# Patient Record
Sex: Female | Born: 1935
Health system: Southern US, Community
[De-identification: ages and names within clinical notes are randomized; demographics above are authoritative.]

## PROBLEM LIST (undated history)

## (undated) DIAGNOSIS — I639 Cerebral infarction, unspecified: Secondary | ICD-10-CM

## (undated) DIAGNOSIS — Z7189 Other specified counseling: Secondary | ICD-10-CM

## (undated) DIAGNOSIS — I4891 Unspecified atrial fibrillation: Secondary | ICD-10-CM

## (undated) DIAGNOSIS — I5032 Chronic diastolic (congestive) heart failure: Secondary | ICD-10-CM

## (undated) DIAGNOSIS — C3490 Malignant neoplasm of unspecified part of unspecified bronchus or lung: Secondary | ICD-10-CM

## (undated) DIAGNOSIS — I255 Ischemic cardiomyopathy: Secondary | ICD-10-CM

## (undated) DIAGNOSIS — K219 Gastro-esophageal reflux disease without esophagitis: Secondary | ICD-10-CM

## (undated) DIAGNOSIS — I2510 Atherosclerotic heart disease of native coronary artery without angina pectoris: Secondary | ICD-10-CM

## (undated) DIAGNOSIS — I10 Essential (primary) hypertension: Secondary | ICD-10-CM

## (undated) DIAGNOSIS — R5382 Chronic fatigue, unspecified: Secondary | ICD-10-CM

## (undated) DIAGNOSIS — D649 Anemia, unspecified: Secondary | ICD-10-CM

## (undated) DIAGNOSIS — I82409 Acute embolism and thrombosis of unspecified deep veins of unspecified lower extremity: Secondary | ICD-10-CM

## (undated) DIAGNOSIS — N189 Chronic kidney disease, unspecified: Secondary | ICD-10-CM

## (undated) DIAGNOSIS — E7800 Pure hypercholesterolemia, unspecified: Secondary | ICD-10-CM

## (undated) DIAGNOSIS — M21371 Foot drop, right foot: Secondary | ICD-10-CM

## (undated) HISTORY — DX: Essential (primary) hypertension: I10

## (undated) HISTORY — PX: CYSTOSCOPY: SHX5120

## (undated) HISTORY — DX: Anemia, unspecified: D64.9

## (undated) HISTORY — DX: Malignant neoplasm of unspecified part of unspecified bronchus or lung: C34.90

## (undated) HISTORY — DX: Pure hypercholesterolemia, unspecified: E78.00

## (undated) HISTORY — DX: Cerebral infarction, unspecified: I63.9

## (undated) HISTORY — PX: ABDOMINAL HYSTERECTOMY: SHX81

## (undated) HISTORY — DX: Chronic kidney disease, unspecified: N18.9

## (undated) HISTORY — PX: TONSILLECTOMY: SUR1361

## (undated) HISTORY — DX: Other specified counseling: Z71.89

## (undated) HISTORY — DX: Atherosclerotic heart disease of native coronary artery without angina pectoris: I25.10

## (undated) HISTORY — DX: Chronic fatigue, unspecified: R53.82

## (undated) HISTORY — PX: PORTACATH PLACEMENT: SHX2246

## (undated) HISTORY — PX: APPENDECTOMY: SHX54

## (undated) HISTORY — DX: Ischemic cardiomyopathy: I25.5

## (undated) HISTORY — PX: HEMORROIDECTOMY: SUR656

## (undated) HISTORY — PX: LAMINECTOMY: SHX219

## (undated) HISTORY — DX: Gastro-esophageal reflux disease without esophagitis: K21.9

## (undated) HISTORY — PX: CATARACT EXTRACTION: SUR2

---

## 2005-05-03 HISTORY — PX: HEMATOMA EVACUATION: SHX5118

## 2005-05-29 ENCOUNTER — Ambulatory Visit: Payer: Self-pay | Admitting: Internal Medicine

## 2005-05-29 ENCOUNTER — Inpatient Hospital Stay (HOSPITAL_COMMUNITY): Admission: EM | Admit: 2005-05-29 | Discharge: 2005-06-06 | Payer: Self-pay | Admitting: Cardiology

## 2005-05-29 ENCOUNTER — Encounter: Payer: Self-pay | Admitting: Cardiology

## 2005-06-11 ENCOUNTER — Ambulatory Visit: Payer: Self-pay | Admitting: Cardiology

## 2005-06-11 ENCOUNTER — Inpatient Hospital Stay (HOSPITAL_COMMUNITY): Admission: AD | Admit: 2005-06-11 | Discharge: 2005-06-17 | Payer: Self-pay | Admitting: Vascular Surgery

## 2005-06-27 ENCOUNTER — Ambulatory Visit: Payer: Self-pay | Admitting: Cardiology

## 2005-06-28 ENCOUNTER — Ambulatory Visit: Payer: Self-pay | Admitting: Cardiology

## 2005-07-01 ENCOUNTER — Ambulatory Visit: Payer: Self-pay | Admitting: Cardiology

## 2005-07-05 ENCOUNTER — Ambulatory Visit: Payer: Self-pay | Admitting: Cardiology

## 2005-07-09 ENCOUNTER — Ambulatory Visit: Payer: Self-pay | Admitting: Cardiology

## 2005-07-23 ENCOUNTER — Ambulatory Visit: Payer: Self-pay | Admitting: Cardiology

## 2005-08-13 ENCOUNTER — Ambulatory Visit: Payer: Self-pay | Admitting: *Deleted

## 2005-08-15 ENCOUNTER — Encounter: Payer: Self-pay | Admitting: Cardiology

## 2005-08-15 ENCOUNTER — Ambulatory Visit: Payer: Self-pay

## 2005-08-15 ENCOUNTER — Ambulatory Visit: Payer: Self-pay | Admitting: Cardiology

## 2005-08-16 ENCOUNTER — Ambulatory Visit: Payer: Self-pay | Admitting: Cardiology

## 2005-08-16 ENCOUNTER — Inpatient Hospital Stay (HOSPITAL_COMMUNITY): Admission: AD | Admit: 2005-08-16 | Discharge: 2005-08-21 | Payer: Self-pay | Admitting: Cardiology

## 2005-08-19 ENCOUNTER — Ambulatory Visit: Payer: Self-pay | Admitting: Emergency Medicine

## 2005-08-26 ENCOUNTER — Ambulatory Visit (HOSPITAL_COMMUNITY): Admission: RE | Admit: 2005-08-26 | Discharge: 2005-08-26 | Payer: Self-pay | Admitting: Emergency Medicine

## 2005-08-26 ENCOUNTER — Encounter (INDEPENDENT_AMBULATORY_CARE_PROVIDER_SITE_OTHER): Payer: Self-pay | Admitting: Specialist

## 2005-08-29 ENCOUNTER — Ambulatory Visit: Payer: Self-pay | Admitting: Emergency Medicine

## 2005-08-30 ENCOUNTER — Ambulatory Visit: Payer: Self-pay | Admitting: Internal Medicine

## 2005-09-04 LAB — COMPREHENSIVE METABOLIC PANEL
AST: 23 U/L (ref 0–37)
Alkaline Phosphatase: 82 U/L (ref 39–117)
BUN: 47 mg/dL — ABNORMAL HIGH (ref 6–23)
Calcium: 9.2 mg/dL (ref 8.4–10.5)
Creatinine, Ser: 3.5 mg/dL — ABNORMAL HIGH (ref 0.4–1.2)
Glucose, Bld: 107 mg/dL — ABNORMAL HIGH (ref 70–99)
Total Bilirubin: 0.7 mg/dL (ref 0.3–1.2)
Total Protein: 7.5 g/dL (ref 6.0–8.3)

## 2005-09-04 LAB — CBC WITH DIFFERENTIAL/PLATELET
Basophils Absolute: 0.1 10*3/uL (ref 0.0–0.1)
EOS%: 8.3 % — ABNORMAL HIGH (ref 0.0–7.0)
HCT: 36.8 % (ref 34.8–46.6)
HGB: 12 g/dL (ref 11.6–15.9)
LYMPH%: 13.3 % — ABNORMAL LOW (ref 14.0–48.0)
MCH: 26.2 pg (ref 26.0–34.0)
MCHC: 32.7 g/dL (ref 32.0–36.0)
NEUT%: 68.4 % (ref 39.6–76.8)
Platelets: 269 10*3/uL (ref 145–400)
lymph#: 0.8 10*3/uL — ABNORMAL LOW (ref 0.9–3.3)

## 2005-09-05 ENCOUNTER — Ambulatory Visit (HOSPITAL_COMMUNITY): Admission: RE | Admit: 2005-09-05 | Discharge: 2005-09-05 | Payer: Self-pay | Admitting: Internal Medicine

## 2005-09-12 LAB — COMPREHENSIVE METABOLIC PANEL
ALT: 26 U/L (ref 0–40)
AST: 33 U/L (ref 0–37)
Albumin: 4.2 g/dL (ref 3.5–5.2)
Calcium: 9.1 mg/dL (ref 8.4–10.5)
Chloride: 101 mEq/L (ref 96–112)
Potassium: 4.2 mEq/L (ref 3.5–5.3)
Sodium: 139 mEq/L (ref 135–145)
Total Protein: 7.3 g/dL (ref 6.0–8.3)

## 2005-09-12 LAB — CBC WITH DIFFERENTIAL/PLATELET
BASO%: 0.7 % (ref 0.0–2.0)
EOS%: 11.6 % — ABNORMAL HIGH (ref 0.0–7.0)
HGB: 11.6 g/dL (ref 11.6–15.9)
MCH: 25.8 pg — ABNORMAL LOW (ref 26.0–34.0)
MCHC: 32.8 g/dL (ref 32.0–36.0)
RDW: 15.1 % — ABNORMAL HIGH (ref 11.3–14.5)
lymph#: 0.9 10*3/uL (ref 0.9–3.3)

## 2005-09-18 ENCOUNTER — Encounter: Admission: RE | Admit: 2005-09-18 | Discharge: 2005-09-18 | Payer: Self-pay | Admitting: Internal Medicine

## 2005-09-19 ENCOUNTER — Ambulatory Visit: Payer: Self-pay | Admitting: Cardiology

## 2005-09-23 LAB — CBC WITH DIFFERENTIAL/PLATELET
Eosinophils Absolute: 0 10*3/uL (ref 0.0–0.5)
LYMPH%: 4.6 % — ABNORMAL LOW (ref 14.0–48.0)
MCV: 77.8 fL — ABNORMAL LOW (ref 81.0–101.0)
MONO%: 1.9 % (ref 0.0–13.0)
NEUT#: 9.3 10*3/uL — ABNORMAL HIGH (ref 1.5–6.5)
NEUT%: 93.3 % — ABNORMAL HIGH (ref 39.6–76.8)
Platelets: 198 10*3/uL (ref 145–400)
RBC: 4.44 10*6/uL (ref 3.70–5.32)

## 2005-09-23 LAB — COMPREHENSIVE METABOLIC PANEL
ALT: 81 U/L — ABNORMAL HIGH (ref 0–40)
Alkaline Phosphatase: 131 U/L — ABNORMAL HIGH (ref 39–117)
Glucose, Bld: 227 mg/dL — ABNORMAL HIGH (ref 70–99)
Sodium: 134 mEq/L — ABNORMAL LOW (ref 135–145)
Total Bilirubin: 0.5 mg/dL (ref 0.3–1.2)
Total Protein: 7.6 g/dL (ref 6.0–8.3)

## 2005-09-27 LAB — CBC WITH DIFFERENTIAL/PLATELET
BASO%: 0.3 % (ref 0.0–2.0)
EOS%: 2.4 % (ref 0.0–7.0)
LYMPH%: 3.8 % — ABNORMAL LOW (ref 14.0–48.0)
MCH: 25.7 pg — ABNORMAL LOW (ref 26.0–34.0)
MCHC: 33.1 g/dL (ref 32.0–36.0)
MCV: 77.6 fL — ABNORMAL LOW (ref 81.0–101.0)
MONO#: 0.1 10*3/uL (ref 0.1–0.9)
MONO%: 0.9 % (ref 0.0–13.0)
Platelets: 110 10*3/uL — ABNORMAL LOW (ref 145–400)
RBC: 4.4 10*6/uL (ref 3.70–5.32)
WBC: 11 10*3/uL — ABNORMAL HIGH (ref 3.9–10.0)

## 2005-09-27 LAB — BASIC METABOLIC PANEL
Calcium: 7.7 mg/dL — ABNORMAL LOW (ref 8.4–10.5)
Creatinine, Ser: 3 mg/dL — ABNORMAL HIGH (ref 0.4–1.2)
Sodium: 135 mEq/L (ref 135–145)

## 2005-10-02 LAB — CBC WITH DIFFERENTIAL/PLATELET
BASO%: 1.1 % (ref 0.0–2.0)
EOS%: 0.5 % (ref 0.0–7.0)
MCH: 25.7 pg — ABNORMAL LOW (ref 26.0–34.0)
MCHC: 33 g/dL (ref 32.0–36.0)
RDW: 14.8 % — ABNORMAL HIGH (ref 11.3–14.5)
lymph#: 0.7 10*3/uL — ABNORMAL LOW (ref 0.9–3.3)

## 2005-10-02 LAB — COMPREHENSIVE METABOLIC PANEL
ALT: 33 U/L (ref 0–40)
AST: 22 U/L (ref 0–37)
Albumin: 3.8 g/dL (ref 3.5–5.2)
Calcium: 8.6 mg/dL (ref 8.4–10.5)
Chloride: 105 mEq/L (ref 96–112)
Creatinine, Ser: 2.5 mg/dL — ABNORMAL HIGH (ref 0.4–1.2)
Potassium: 4.3 mEq/L (ref 3.5–5.3)

## 2005-10-09 LAB — CBC WITH DIFFERENTIAL/PLATELET
BASO%: 0.4 % (ref 0.0–2.0)
EOS%: 2.2 % (ref 0.0–7.0)
HCT: 34.8 % (ref 34.8–46.6)
MCH: 25.7 pg — ABNORMAL LOW (ref 26.0–34.0)
MCHC: 32.7 g/dL (ref 32.0–36.0)
MCV: 78.4 fL — ABNORMAL LOW (ref 81.0–101.0)
MONO%: 7.1 % (ref 0.0–13.0)
NEUT%: 79 % — ABNORMAL HIGH (ref 39.6–76.8)
lymph#: 0.8 10*3/uL — ABNORMAL LOW (ref 0.9–3.3)

## 2005-10-09 LAB — COMPREHENSIVE METABOLIC PANEL
ALT: 21 U/L (ref 0–40)
AST: 25 U/L (ref 0–37)
Alkaline Phosphatase: 113 U/L (ref 39–117)
Calcium: 8.7 mg/dL (ref 8.4–10.5)
Chloride: 108 mEq/L (ref 96–112)
Creatinine, Ser: 2.3 mg/dL — ABNORMAL HIGH (ref 0.4–1.2)
Total Bilirubin: 0.4 mg/dL (ref 0.3–1.2)

## 2005-10-14 ENCOUNTER — Ambulatory Visit (HOSPITAL_COMMUNITY): Admission: RE | Admit: 2005-10-14 | Discharge: 2005-10-14 | Payer: Self-pay | Admitting: Internal Medicine

## 2005-10-14 LAB — COMPREHENSIVE METABOLIC PANEL
BUN: 40 mg/dL — ABNORMAL HIGH (ref 6–23)
CO2: 23 mEq/L (ref 19–32)
Calcium: 9.1 mg/dL (ref 8.4–10.5)
Chloride: 103 mEq/L (ref 96–112)
Creatinine, Ser: 2.5 mg/dL — ABNORMAL HIGH (ref 0.4–1.2)
Glucose, Bld: 151 mg/dL — ABNORMAL HIGH (ref 70–99)
Total Bilirubin: 0.4 mg/dL (ref 0.3–1.2)

## 2005-10-14 LAB — CBC WITH DIFFERENTIAL/PLATELET
BASO%: 0.2 % (ref 0.0–2.0)
Basophils Absolute: 0 10*3/uL (ref 0.0–0.1)
EOS%: 0.1 % (ref 0.0–7.0)
HCT: 35.6 % (ref 34.8–46.6)
LYMPH%: 8.3 % — ABNORMAL LOW (ref 14.0–48.0)
MCH: 26 pg (ref 26.0–34.0)
MCHC: 33.2 g/dL (ref 32.0–36.0)
MCV: 78.2 fL — ABNORMAL LOW (ref 81.0–101.0)
NEUT%: 88.2 % — ABNORMAL HIGH (ref 39.6–76.8)
Platelets: 206 10*3/uL (ref 145–400)

## 2005-10-15 ENCOUNTER — Ambulatory Visit: Payer: Self-pay | Admitting: Internal Medicine

## 2005-10-21 LAB — COMPREHENSIVE METABOLIC PANEL
ALT: 27 U/L (ref 0–40)
AST: 25 U/L (ref 0–37)
Alkaline Phosphatase: 103 U/L (ref 39–117)
BUN: 34 mg/dL — ABNORMAL HIGH (ref 6–23)
Calcium: 8.7 mg/dL (ref 8.4–10.5)
Chloride: 104 mEq/L (ref 96–112)
Creatinine, Ser: 2.1 mg/dL — ABNORMAL HIGH (ref 0.4–1.2)
Potassium: 4.5 mEq/L (ref 3.5–5.3)

## 2005-10-21 LAB — CBC WITH DIFFERENTIAL/PLATELET
BASO%: 0.4 % (ref 0.0–2.0)
EOS%: 2.9 % (ref 0.0–7.0)
MCH: 25.9 pg — ABNORMAL LOW (ref 26.0–34.0)
MCHC: 33.1 g/dL (ref 32.0–36.0)
MCV: 78.4 fL — ABNORMAL LOW (ref 81.0–101.0)
MONO%: 18.2 % — ABNORMAL HIGH (ref 0.0–13.0)
NEUT%: 64.7 % (ref 39.6–76.8)
RDW: 16.5 % — ABNORMAL HIGH (ref 11.3–14.5)
lymph#: 0.6 10*3/uL — ABNORMAL LOW (ref 0.9–3.3)

## 2005-10-30 LAB — COMPREHENSIVE METABOLIC PANEL
ALT: 18 U/L (ref 0–40)
AST: 21 U/L (ref 0–37)
Alkaline Phosphatase: 132 U/L — ABNORMAL HIGH (ref 39–117)
BUN: 30 mg/dL — ABNORMAL HIGH (ref 6–23)
Calcium: 8.4 mg/dL (ref 8.4–10.5)
Creatinine, Ser: 2.5 mg/dL — ABNORMAL HIGH (ref 0.4–1.2)
Total Bilirubin: 0.4 mg/dL (ref 0.3–1.2)

## 2005-10-30 LAB — CBC WITH DIFFERENTIAL/PLATELET
BASO%: 0.4 % (ref 0.0–2.0)
Basophils Absolute: 0 10*3/uL (ref 0.0–0.1)
EOS%: 1.1 % (ref 0.0–7.0)
HCT: 34 % — ABNORMAL LOW (ref 34.8–46.6)
HGB: 11.3 g/dL — ABNORMAL LOW (ref 11.6–15.9)
LYMPH%: 10.6 % — ABNORMAL LOW (ref 14.0–48.0)
MCH: 26.3 pg (ref 26.0–34.0)
MCHC: 33.1 g/dL (ref 32.0–36.0)
MCV: 79.5 fL — ABNORMAL LOW (ref 81.0–101.0)
MONO%: 7.7 % (ref 0.0–13.0)
NEUT%: 80.2 % — ABNORMAL HIGH (ref 39.6–76.8)
lymph#: 0.9 10*3/uL (ref 0.9–3.3)

## 2005-11-04 LAB — COMPREHENSIVE METABOLIC PANEL
ALT: 20 U/L (ref 0–40)
AST: 20 U/L (ref 0–37)
Alkaline Phosphatase: 125 U/L — ABNORMAL HIGH (ref 39–117)
CO2: 22 mEq/L (ref 19–32)
Creatinine, Ser: 2.13 mg/dL — ABNORMAL HIGH (ref 0.40–1.20)
Sodium: 140 mEq/L (ref 135–145)
Total Bilirubin: 0.4 mg/dL (ref 0.3–1.2)
Total Protein: 6.9 g/dL (ref 6.0–8.3)

## 2005-11-04 LAB — CBC WITH DIFFERENTIAL/PLATELET
BASO%: 0 % (ref 0.0–2.0)
EOS%: 0 % (ref 0.0–7.0)
HCT: 36.3 % (ref 34.8–46.6)
LYMPH%: 6.7 % — ABNORMAL LOW (ref 14.0–48.0)
MCH: 26.3 pg (ref 26.0–34.0)
MCHC: 32.8 g/dL (ref 32.0–36.0)
MCV: 80.2 fL — ABNORMAL LOW (ref 81.0–101.0)
MONO%: 1.3 % (ref 0.0–13.0)
NEUT%: 92 % — ABNORMAL HIGH (ref 39.6–76.8)
Platelets: 185 10*3/uL (ref 145–400)
RBC: 4.52 10*6/uL (ref 3.70–5.32)

## 2005-11-08 ENCOUNTER — Ambulatory Visit: Payer: Self-pay | Admitting: Oncology

## 2005-11-08 ENCOUNTER — Inpatient Hospital Stay (HOSPITAL_COMMUNITY): Admission: AD | Admit: 2005-11-08 | Discharge: 2005-11-10 | Payer: Self-pay | Admitting: Internal Medicine

## 2005-11-08 LAB — CBC WITH DIFFERENTIAL/PLATELET
BASO%: 0.1 % (ref 0.0–2.0)
HCT: 34.4 % — ABNORMAL LOW (ref 34.8–46.6)
LYMPH%: 4.6 % — ABNORMAL LOW (ref 14.0–48.0)
MCHC: 32.8 g/dL (ref 32.0–36.0)
MCV: 80.5 fL — ABNORMAL LOW (ref 81.0–101.0)
MONO#: 0.1 10*3/uL (ref 0.1–0.9)
MONO%: 0.5 % (ref 0.0–13.0)
NEUT%: 92.8 % — ABNORMAL HIGH (ref 39.6–76.8)
Platelets: 143 10*3/uL — ABNORMAL LOW (ref 145–400)
WBC: 16 10*3/uL — ABNORMAL HIGH (ref 3.9–10.0)

## 2005-11-18 ENCOUNTER — Ambulatory Visit (HOSPITAL_COMMUNITY): Admission: RE | Admit: 2005-11-18 | Discharge: 2005-11-18 | Payer: Self-pay | Admitting: Internal Medicine

## 2005-11-19 ENCOUNTER — Ambulatory Visit: Payer: Self-pay | Admitting: Cardiology

## 2005-11-25 ENCOUNTER — Ambulatory Visit: Payer: Self-pay | Admitting: Internal Medicine

## 2005-11-25 LAB — CBC WITH DIFFERENTIAL/PLATELET
Basophils Absolute: 0 10*3/uL (ref 0.0–0.1)
Eosinophils Absolute: 0 10*3/uL (ref 0.0–0.5)
HCT: 34.5 % — ABNORMAL LOW (ref 34.8–46.6)
LYMPH%: 6.5 % — ABNORMAL LOW (ref 14.0–48.0)
MONO#: 0.2 10*3/uL (ref 0.1–0.9)
NEUT#: 6.2 10*3/uL (ref 1.5–6.5)
NEUT%: 90.4 % — ABNORMAL HIGH (ref 39.6–76.8)
Platelets: 110 10*3/uL — ABNORMAL LOW (ref 145–400)
WBC: 6.8 10*3/uL (ref 3.9–10.0)

## 2005-11-25 LAB — COMPREHENSIVE METABOLIC PANEL
ALT: 20 U/L (ref 0–40)
Alkaline Phosphatase: 122 U/L — ABNORMAL HIGH (ref 39–117)
CO2: 24 mEq/L (ref 19–32)
Creatinine, Ser: 2.18 mg/dL — ABNORMAL HIGH (ref 0.40–1.20)
Glucose, Bld: 139 mg/dL — ABNORMAL HIGH (ref 70–99)
Total Bilirubin: 0.5 mg/dL (ref 0.3–1.2)

## 2005-12-16 LAB — CBC WITH DIFFERENTIAL/PLATELET
BASO%: 0.3 % (ref 0.0–2.0)
EOS%: 0.6 % (ref 0.0–7.0)
Eosinophils Absolute: 0 10*3/uL (ref 0.0–0.5)
LYMPH%: 9.2 % — ABNORMAL LOW (ref 14.0–48.0)
MCHC: 32.8 g/dL (ref 32.0–36.0)
MCV: 89.8 fL (ref 81.0–101.0)
MONO#: 0.1 10*3/uL (ref 0.1–0.9)
NEUT#: 3.8 10*3/uL (ref 1.5–6.5)
NEUT%: 88.5 % — ABNORMAL HIGH (ref 39.6–76.8)
Platelets: 155 10*3/uL (ref 145–400)
RBC: 3.54 10*6/uL — ABNORMAL LOW (ref 3.70–5.32)
RDW: 20.4 % — ABNORMAL HIGH (ref 11.3–14.5)
WBC: 4.3 10*3/uL (ref 3.9–10.0)

## 2005-12-16 LAB — COMPREHENSIVE METABOLIC PANEL
ALT: 30 U/L (ref 0–40)
AST: 30 U/L (ref 0–37)
Albumin: 3.9 g/dL (ref 3.5–5.2)
Alkaline Phosphatase: 146 U/L — ABNORMAL HIGH (ref 39–117)
CO2: 23 mEq/L (ref 19–32)
Creatinine, Ser: 2.3 mg/dL — ABNORMAL HIGH (ref 0.40–1.20)
Glucose, Bld: 144 mg/dL — ABNORMAL HIGH (ref 70–99)
Potassium: 4.3 mEq/L (ref 3.5–5.3)
Sodium: 137 mEq/L (ref 135–145)
Total Bilirubin: 0.5 mg/dL (ref 0.3–1.2)
Total Protein: 6.5 g/dL (ref 6.0–8.3)

## 2005-12-23 LAB — COMPREHENSIVE METABOLIC PANEL
ALT: 19 U/L (ref 0–40)
AST: 28 U/L (ref 0–37)
Albumin: 3.5 g/dL (ref 3.5–5.2)
Alkaline Phosphatase: 113 U/L (ref 39–117)
Glucose, Bld: 104 mg/dL — ABNORMAL HIGH (ref 70–99)
Potassium: 4.4 mEq/L (ref 3.5–5.3)
Sodium: 137 mEq/L (ref 135–145)
Total Bilirubin: 0.7 mg/dL (ref 0.3–1.2)
Total Protein: 5.6 g/dL — ABNORMAL LOW (ref 6.0–8.3)

## 2005-12-23 LAB — CBC WITH DIFFERENTIAL/PLATELET
BASO%: 0.8 % (ref 0.0–2.0)
Eosinophils Absolute: 0 10*3/uL (ref 0.0–0.5)
MCHC: 34.4 g/dL (ref 32.0–36.0)
MCV: 89.5 fL (ref 81.0–101.0)
MONO%: 22.9 % — ABNORMAL HIGH (ref 0.0–13.0)
NEUT#: 1 10*3/uL — ABNORMAL LOW (ref 1.5–6.5)
RBC: 3.14 10*6/uL — ABNORMAL LOW (ref 3.70–5.32)
RDW: 22.9 % — ABNORMAL HIGH (ref 11.3–14.5)
WBC: 1.9 10*3/uL — ABNORMAL LOW (ref 3.9–10.0)

## 2005-12-24 ENCOUNTER — Ambulatory Visit: Payer: Self-pay | Admitting: Internal Medicine

## 2005-12-30 LAB — COMPREHENSIVE METABOLIC PANEL
ALT: 16 U/L (ref 0–40)
AST: 28 U/L (ref 0–37)
CO2: 25 mEq/L (ref 19–32)
Creatinine, Ser: 1.72 mg/dL — ABNORMAL HIGH (ref 0.40–1.20)
Sodium: 140 mEq/L (ref 135–145)
Total Bilirubin: 0.6 mg/dL (ref 0.3–1.2)
Total Protein: 5.3 g/dL — ABNORMAL LOW (ref 6.0–8.3)

## 2005-12-30 LAB — CBC WITH DIFFERENTIAL/PLATELET
EOS%: 0.8 % (ref 0.0–7.0)
LYMPH%: 10.8 % — ABNORMAL LOW (ref 14.0–48.0)
MCH: 30.9 pg (ref 26.0–34.0)
MCHC: 33.8 g/dL (ref 32.0–36.0)
MCV: 91.4 fL (ref 81.0–101.0)
MONO%: 7.1 % (ref 0.0–13.0)
NEUT#: 3.6 10*3/uL (ref 1.5–6.5)
Platelets: 30 10*3/uL — ABNORMAL LOW (ref 145–400)
RBC: 3.07 10*6/uL — ABNORMAL LOW (ref 3.70–5.32)
RDW: 22.2 % — ABNORMAL HIGH (ref 11.3–14.5)

## 2006-01-06 LAB — COMPREHENSIVE METABOLIC PANEL
BUN: 27 mg/dL — ABNORMAL HIGH (ref 6–23)
Chloride: 100 mEq/L (ref 96–112)
Creatinine, Ser: 1.92 mg/dL — ABNORMAL HIGH (ref 0.40–1.20)
Glucose, Bld: 127 mg/dL — ABNORMAL HIGH (ref 70–99)
Potassium: 3.8 mEq/L (ref 3.5–5.3)
Total Bilirubin: 0.5 mg/dL (ref 0.3–1.2)
Total Protein: 6.1 g/dL (ref 6.0–8.3)

## 2006-01-06 LAB — CBC WITH DIFFERENTIAL/PLATELET
BASO%: 0.3 % (ref 0.0–2.0)
EOS%: 0.1 % (ref 0.0–7.0)
MCH: 31.3 pg (ref 26.0–34.0)
MCHC: 32.3 g/dL (ref 32.0–36.0)
MCV: 96.9 fL (ref 81.0–101.0)
MONO%: 3.5 % (ref 0.0–13.0)
RBC: 3.38 10*6/uL — ABNORMAL LOW (ref 3.70–5.32)
RDW: 19.7 % — ABNORMAL HIGH (ref 11.3–14.5)
lymph#: 0.3 10*3/uL — ABNORMAL LOW (ref 0.9–3.3)

## 2006-01-13 LAB — COMPREHENSIVE METABOLIC PANEL
Albumin: 3.5 g/dL (ref 3.5–5.2)
CO2: 22 mEq/L (ref 19–32)
Calcium: 8.3 mg/dL — ABNORMAL LOW (ref 8.4–10.5)
Glucose, Bld: 137 mg/dL — ABNORMAL HIGH (ref 70–99)
Potassium: 3.9 mEq/L (ref 3.5–5.3)
Sodium: 139 mEq/L (ref 135–145)
Total Bilirubin: 0.7 mg/dL (ref 0.3–1.2)
Total Protein: 5.5 g/dL — ABNORMAL LOW (ref 6.0–8.3)

## 2006-01-13 LAB — CBC WITH DIFFERENTIAL/PLATELET
Eosinophils Absolute: 0.1 10*3/uL (ref 0.0–0.5)
LYMPH%: 28.2 % (ref 14.0–48.0)
MONO#: 0.4 10*3/uL (ref 0.1–0.9)
NEUT#: 0.7 10*3/uL — ABNORMAL LOW (ref 1.5–6.5)
Platelets: 73 10*3/uL — ABNORMAL LOW (ref 145–400)
RBC: 3.11 10*6/uL — ABNORMAL LOW (ref 3.70–5.32)
WBC: 1.8 10*3/uL — ABNORMAL LOW (ref 3.9–10.0)

## 2006-01-20 LAB — CBC WITH DIFFERENTIAL/PLATELET
Basophils Absolute: 0.1 10*3/uL (ref 0.0–0.1)
Eosinophils Absolute: 0.1 10*3/uL (ref 0.0–0.5)
HCT: 28.2 % — ABNORMAL LOW (ref 34.8–46.6)
HGB: 9.5 g/dL — ABNORMAL LOW (ref 11.6–15.9)
LYMPH%: 13.3 % — ABNORMAL LOW (ref 14.0–48.0)
MCV: 97.4 fL (ref 81.0–101.0)
MONO%: 5.6 % (ref 0.0–13.0)
NEUT#: 3.6 10*3/uL (ref 1.5–6.5)
NEUT%: 78.6 % — ABNORMAL HIGH (ref 39.6–76.8)
Platelets: 23 10*3/uL — ABNORMAL LOW (ref 145–400)
RBC: 2.89 10*6/uL — ABNORMAL LOW (ref 3.70–5.32)

## 2006-01-20 LAB — COMPREHENSIVE METABOLIC PANEL
Alkaline Phosphatase: 138 U/L — ABNORMAL HIGH (ref 39–117)
BUN: 18 mg/dL (ref 6–23)
CO2: 24 mEq/L (ref 19–32)
Creatinine, Ser: 1.84 mg/dL — ABNORMAL HIGH (ref 0.40–1.20)
Glucose, Bld: 103 mg/dL — ABNORMAL HIGH (ref 70–99)
Total Bilirubin: 0.7 mg/dL (ref 0.3–1.2)

## 2006-01-27 LAB — CBC WITH DIFFERENTIAL/PLATELET
Basophils Absolute: 0 10*3/uL (ref 0.0–0.1)
Eosinophils Absolute: 0.2 10*3/uL (ref 0.0–0.5)
HGB: 9.7 g/dL — ABNORMAL LOW (ref 11.6–15.9)
LYMPH%: 19.3 % (ref 14.0–48.0)
MCV: 100.3 fL (ref 81.0–101.0)
MONO#: 0.3 10*3/uL (ref 0.1–0.9)
MONO%: 10.9 % (ref 0.0–13.0)
NEUT#: 1.8 10*3/uL (ref 1.5–6.5)
Platelets: 77 10*3/uL — ABNORMAL LOW (ref 145–400)
RDW: 20.7 % — ABNORMAL HIGH (ref 11.3–14.5)
WBC: 3 10*3/uL — ABNORMAL LOW (ref 3.9–10.0)

## 2006-01-27 LAB — COMPREHENSIVE METABOLIC PANEL
Albumin: 3.3 g/dL — ABNORMAL LOW (ref 3.5–5.2)
Alkaline Phosphatase: 118 U/L — ABNORMAL HIGH (ref 39–117)
BUN: 22 mg/dL (ref 6–23)
CO2: 28 mEq/L (ref 19–32)
Glucose, Bld: 93 mg/dL (ref 70–99)
Potassium: 3.4 mEq/L — ABNORMAL LOW (ref 3.5–5.3)
Sodium: 141 mEq/L (ref 135–145)
Total Protein: 5.5 g/dL — ABNORMAL LOW (ref 6.0–8.3)

## 2006-01-28 ENCOUNTER — Ambulatory Visit (HOSPITAL_COMMUNITY): Admission: RE | Admit: 2006-01-28 | Discharge: 2006-01-28 | Payer: Self-pay | Admitting: Internal Medicine

## 2006-02-04 LAB — COMPREHENSIVE METABOLIC PANEL
Albumin: 3.5 g/dL (ref 3.5–5.2)
Alkaline Phosphatase: 145 U/L — ABNORMAL HIGH (ref 39–117)
BUN: 22 mg/dL (ref 6–23)
CO2: 28 mEq/L (ref 19–32)
Calcium: 8.8 mg/dL (ref 8.4–10.5)
Chloride: 100 mEq/L (ref 96–112)
Glucose, Bld: 117 mg/dL — ABNORMAL HIGH (ref 70–99)
Potassium: 3.6 mEq/L (ref 3.5–5.3)
Sodium: 140 mEq/L (ref 135–145)
Total Protein: 5.7 g/dL — ABNORMAL LOW (ref 6.0–8.3)

## 2006-02-04 LAB — CBC WITH DIFFERENTIAL/PLATELET
Basophils Absolute: 0 10*3/uL (ref 0.0–0.1)
Eosinophils Absolute: 0.2 10*3/uL (ref 0.0–0.5)
HGB: 11 g/dL — ABNORMAL LOW (ref 11.6–15.9)
MCV: 99.5 fL (ref 81.0–101.0)
MONO#: 0.6 10*3/uL (ref 0.1–0.9)
NEUT#: 2.2 10*3/uL (ref 1.5–6.5)
RBC: 3.31 10*6/uL — ABNORMAL LOW (ref 3.70–5.32)
RDW: 19 % — ABNORMAL HIGH (ref 11.3–14.5)
WBC: 3.6 10*3/uL — ABNORMAL LOW (ref 3.9–10.0)
lymph#: 0.5 10*3/uL — ABNORMAL LOW (ref 0.9–3.3)

## 2006-02-20 ENCOUNTER — Ambulatory Visit: Payer: Self-pay | Admitting: Cardiology

## 2006-03-28 ENCOUNTER — Ambulatory Visit: Payer: Self-pay | Admitting: Internal Medicine

## 2006-04-03 ENCOUNTER — Ambulatory Visit (HOSPITAL_COMMUNITY): Admission: RE | Admit: 2006-04-03 | Discharge: 2006-04-03 | Payer: Self-pay | Admitting: Internal Medicine

## 2006-04-03 LAB — COMPREHENSIVE METABOLIC PANEL
ALT: 17 U/L (ref 0–35)
AST: 24 U/L (ref 0–37)
Albumin: 4 g/dL (ref 3.5–5.2)
BUN: 31 mg/dL — ABNORMAL HIGH (ref 6–23)
Calcium: 8.8 mg/dL (ref 8.4–10.5)
Chloride: 106 mEq/L (ref 96–112)
Potassium: 4 mEq/L (ref 3.5–5.3)
Sodium: 142 mEq/L (ref 135–145)
Total Protein: 6.6 g/dL (ref 6.0–8.3)

## 2006-04-03 LAB — CBC WITH DIFFERENTIAL/PLATELET
Basophils Absolute: 0 10*3/uL (ref 0.0–0.1)
EOS%: 9.5 % — ABNORMAL HIGH (ref 0.0–7.0)
HCT: 36.7 % (ref 34.8–46.6)
HGB: 12.4 g/dL (ref 11.6–15.9)
MONO#: 0.4 10*3/uL (ref 0.1–0.9)
NEUT#: 2.9 10*3/uL (ref 1.5–6.5)
NEUT%: 63.9 % (ref 39.6–76.8)
RDW: 13.8 % (ref 11.3–14.5)
WBC: 4.5 10*3/uL (ref 3.9–10.0)
lymph#: 0.7 10*3/uL — ABNORMAL LOW (ref 0.9–3.3)

## 2006-04-14 ENCOUNTER — Ambulatory Visit: Payer: Self-pay | Admitting: Cardiology

## 2006-06-27 ENCOUNTER — Ambulatory Visit: Payer: Self-pay | Admitting: Internal Medicine

## 2006-07-01 ENCOUNTER — Ambulatory Visit (HOSPITAL_COMMUNITY): Admission: RE | Admit: 2006-07-01 | Discharge: 2006-07-01 | Payer: Self-pay | Admitting: Internal Medicine

## 2006-07-01 LAB — COMPREHENSIVE METABOLIC PANEL
ALT: 21 U/L (ref 0–35)
AST: 25 U/L (ref 0–37)
Calcium: 9 mg/dL (ref 8.4–10.5)
Chloride: 104 mEq/L (ref 96–112)
Creatinine, Ser: 1.88 mg/dL — ABNORMAL HIGH (ref 0.40–1.20)

## 2006-07-01 LAB — CBC WITH DIFFERENTIAL/PLATELET
BASO%: 1 % (ref 0.0–2.0)
EOS%: 16.2 % — ABNORMAL HIGH (ref 0.0–7.0)
HCT: 40.3 % (ref 34.8–46.6)
MCH: 28.8 pg (ref 26.0–34.0)
MCHC: 33.8 g/dL (ref 32.0–36.0)
NEUT%: 58.6 % (ref 39.6–76.8)
RDW: 13.2 % (ref 11.3–14.5)
lymph#: 0.8 10*3/uL — ABNORMAL LOW (ref 0.9–3.3)

## 2006-07-08 ENCOUNTER — Ambulatory Visit: Payer: Self-pay | Admitting: Internal Medicine

## 2006-07-08 ENCOUNTER — Encounter: Payer: Self-pay | Admitting: Cardiology

## 2006-07-08 ENCOUNTER — Ambulatory Visit: Payer: Self-pay

## 2006-07-23 ENCOUNTER — Ambulatory Visit: Payer: Self-pay | Admitting: Cardiology

## 2006-07-23 LAB — CONVERTED CEMR LAB
BUN: 39 mg/dL — ABNORMAL HIGH (ref 6–23)
CO2: 29 meq/L (ref 19–32)
Calcium: 8.8 mg/dL (ref 8.4–10.5)
Chloride: 105 meq/L (ref 96–112)
Creatinine, Ser: 2.1 mg/dL — ABNORMAL HIGH (ref 0.4–1.2)
GFR calc Af Amer: 30 mL/min
GFR calc non Af Amer: 25 mL/min
Glucose, Bld: 127 mg/dL — ABNORMAL HIGH (ref 70–99)
Potassium: 3.8 meq/L (ref 3.5–5.1)
Sodium: 141 meq/L (ref 135–145)

## 2006-08-14 ENCOUNTER — Ambulatory Visit: Payer: Self-pay | Admitting: Internal Medicine

## 2006-08-19 ENCOUNTER — Ambulatory Visit: Payer: Self-pay | Admitting: Cardiology

## 2006-08-19 LAB — CONVERTED CEMR LAB
BUN: 27 mg/dL — ABNORMAL HIGH (ref 6–23)
CO2: 32 meq/L (ref 19–32)
Calcium: 9.2 mg/dL (ref 8.4–10.5)
Chloride: 100 meq/L (ref 96–112)
Creatinine, Ser: 1.9 mg/dL — ABNORMAL HIGH (ref 0.4–1.2)
Potassium: 4.1 meq/L (ref 3.5–5.1)

## 2006-08-26 ENCOUNTER — Ambulatory Visit (HOSPITAL_COMMUNITY): Admission: RE | Admit: 2006-08-26 | Discharge: 2006-08-26 | Payer: Self-pay | Admitting: Internal Medicine

## 2006-08-26 LAB — COMPREHENSIVE METABOLIC PANEL
ALT: 22 U/L (ref 0–35)
AST: 23 U/L (ref 0–37)
Alkaline Phosphatase: 182 U/L — ABNORMAL HIGH (ref 39–117)
Creatinine, Ser: 1.99 mg/dL — ABNORMAL HIGH (ref 0.40–1.20)
Total Bilirubin: 0.5 mg/dL (ref 0.3–1.2)

## 2006-08-26 LAB — CBC WITH DIFFERENTIAL/PLATELET
BASO%: 0.8 % (ref 0.0–2.0)
EOS%: 12 % — ABNORMAL HIGH (ref 0.0–7.0)
HCT: 40.7 % (ref 34.8–46.6)
LYMPH%: 14.4 % (ref 14.0–48.0)
MCH: 29.6 pg (ref 26.0–34.0)
MCHC: 34.9 g/dL (ref 32.0–36.0)
MCV: 84.7 fL (ref 81.0–101.0)
MONO%: 11.3 % (ref 0.0–13.0)
NEUT%: 61.5 % (ref 39.6–76.8)
Platelets: 185 10*3/uL (ref 145–400)
lymph#: 0.8 10*3/uL — ABNORMAL LOW (ref 0.9–3.3)

## 2006-09-25 ENCOUNTER — Ambulatory Visit: Payer: Self-pay | Admitting: Internal Medicine

## 2006-10-29 ENCOUNTER — Ambulatory Visit: Payer: Self-pay | Admitting: Cardiology

## 2006-10-29 LAB — CONVERTED CEMR LAB
BUN: 37 mg/dL — ABNORMAL HIGH (ref 6–23)
CO2: 32 meq/L (ref 19–32)
Calcium: 9.4 mg/dL (ref 8.4–10.5)
Chloride: 102 meq/L (ref 96–112)
Creatinine, Ser: 2.3 mg/dL — ABNORMAL HIGH (ref 0.4–1.2)
Glucose, Bld: 95 mg/dL (ref 70–99)

## 2006-11-25 ENCOUNTER — Ambulatory Visit: Payer: Self-pay | Admitting: Internal Medicine

## 2006-11-28 ENCOUNTER — Ambulatory Visit (HOSPITAL_COMMUNITY): Admission: RE | Admit: 2006-11-28 | Discharge: 2006-11-28 | Payer: Self-pay | Admitting: Internal Medicine

## 2006-11-28 LAB — CBC WITH DIFFERENTIAL/PLATELET
BASO%: 1.2 % (ref 0.0–2.0)
Basophils Absolute: 0.1 10*3/uL (ref 0.0–0.1)
EOS%: 11.4 % — ABNORMAL HIGH (ref 0.0–7.0)
Eosinophils Absolute: 0.6 10*3/uL — ABNORMAL HIGH (ref 0.0–0.5)
HCT: 40.6 % (ref 34.8–46.6)
HGB: 14.2 g/dL (ref 11.6–15.9)
LYMPH%: 17.1 % (ref 14.0–48.0)
MCH: 29.6 pg (ref 26.0–34.0)
MCHC: 35 g/dL (ref 32.0–36.0)
MCV: 84.4 fL (ref 81.0–101.0)
MONO#: 0.6 10*3/uL (ref 0.1–0.9)
MONO%: 11.8 % (ref 0.0–13.0)
NEUT#: 3 10*3/uL (ref 1.5–6.5)
NEUT%: 58.5 % (ref 39.6–76.8)
Platelets: 192 10*3/uL (ref 145–400)
RBC: 4.81 10*6/uL (ref 3.70–5.32)
RDW: 12.4 % (ref 11.3–14.5)
WBC: 5.2 10*3/uL (ref 3.9–10.0)
lymph#: 0.9 10*3/uL (ref 0.9–3.3)

## 2006-11-28 LAB — COMPREHENSIVE METABOLIC PANEL
ALT: 22 U/L (ref 0–35)
AST: 25 U/L (ref 0–37)
Albumin: 3.6 g/dL (ref 3.5–5.2)
Alkaline Phosphatase: 145 U/L — ABNORMAL HIGH (ref 39–117)
BUN: 31 mg/dL — ABNORMAL HIGH (ref 6–23)
CO2: 28 mEq/L (ref 19–32)
Calcium: 9.1 mg/dL (ref 8.4–10.5)
Chloride: 103 mEq/L (ref 96–112)
Creatinine, Ser: 1.89 mg/dL — ABNORMAL HIGH (ref 0.40–1.20)
Glucose, Bld: 92 mg/dL (ref 70–99)
Potassium: 4 mEq/L (ref 3.5–5.3)
Sodium: 138 mEq/L (ref 135–145)
Total Bilirubin: 0.8 mg/dL (ref 0.3–1.2)
Total Protein: 7.1 g/dL (ref 6.0–8.3)

## 2006-12-12 ENCOUNTER — Ambulatory Visit (HOSPITAL_COMMUNITY): Admission: RE | Admit: 2006-12-12 | Discharge: 2006-12-12 | Payer: Self-pay | Admitting: Internal Medicine

## 2007-01-06 ENCOUNTER — Ambulatory Visit: Payer: Self-pay | Admitting: Cardiology

## 2007-01-06 LAB — CONVERTED CEMR LAB
CO2: 31 meq/L (ref 19–32)
Calcium: 9.5 mg/dL (ref 8.4–10.5)
Chloride: 100 meq/L (ref 96–112)
Creatinine, Ser: 2.1 mg/dL — ABNORMAL HIGH (ref 0.4–1.2)
Glucose, Bld: 106 mg/dL — ABNORMAL HIGH (ref 70–99)
Sodium: 139 meq/L (ref 135–145)

## 2007-01-19 ENCOUNTER — Ambulatory Visit: Payer: Self-pay | Admitting: Cardiology

## 2007-01-19 LAB — CONVERTED CEMR LAB
CO2: 33 meq/L — ABNORMAL HIGH (ref 19–32)
Chloride: 102 meq/L (ref 96–112)
Creatinine, Ser: 2 mg/dL — ABNORMAL HIGH (ref 0.4–1.2)
Potassium: 4 meq/L (ref 3.5–5.1)
Sodium: 143 meq/L (ref 135–145)

## 2007-02-23 ENCOUNTER — Ambulatory Visit: Payer: Self-pay | Admitting: Internal Medicine

## 2007-02-25 ENCOUNTER — Ambulatory Visit (HOSPITAL_COMMUNITY): Admission: RE | Admit: 2007-02-25 | Discharge: 2007-02-25 | Payer: Self-pay | Admitting: Internal Medicine

## 2007-02-25 LAB — CBC WITH DIFFERENTIAL/PLATELET
BASO%: 0.2 % (ref 0.0–2.0)
EOS%: 5.8 % (ref 0.0–7.0)
HCT: 42.5 % (ref 34.8–46.6)
LYMPH%: 11.4 % — ABNORMAL LOW (ref 14.0–48.0)
MCH: 29.9 pg (ref 26.0–34.0)
MCHC: 35 g/dL (ref 32.0–36.0)
MONO%: 6.6 % (ref 0.0–13.0)
NEUT%: 76 % (ref 39.6–76.8)
Platelets: 206 10*3/uL (ref 145–400)
RBC: 4.97 10*6/uL (ref 3.70–5.32)

## 2007-02-25 LAB — COMPREHENSIVE METABOLIC PANEL
ALT: 15 U/L (ref 0–35)
AST: 18 U/L (ref 0–37)
Alkaline Phosphatase: 103 U/L (ref 39–117)
Creatinine, Ser: 2.13 mg/dL — ABNORMAL HIGH (ref 0.40–1.20)
Sodium: 141 mEq/L (ref 135–145)
Total Bilirubin: 0.6 mg/dL (ref 0.3–1.2)

## 2007-05-11 ENCOUNTER — Ambulatory Visit: Payer: Self-pay | Admitting: Cardiology

## 2007-05-11 LAB — CONVERTED CEMR LAB
CO2: 30 meq/L (ref 19–32)
Calcium: 9.1 mg/dL (ref 8.4–10.5)
Creatinine, Ser: 1.8 mg/dL — ABNORMAL HIGH (ref 0.4–1.2)
GFR calc Af Amer: 36 mL/min
Glucose, Bld: 104 mg/dL — ABNORMAL HIGH (ref 70–99)
Potassium: 3.9 meq/L (ref 3.5–5.1)

## 2007-05-12 LAB — CONVERTED CEMR LAB
BUN: 24 mg/dL — ABNORMAL HIGH (ref 6–23)
Bacteria, UA: NEGATIVE
CO2: 30 meq/L (ref 19–32)
Calcium: 9.1 mg/dL (ref 8.4–10.5)
Chloride: 97 meq/L (ref 96–112)
Creatinine, Ser: 1.8 mg/dL — ABNORMAL HIGH (ref 0.4–1.2)
Crystals: NEGATIVE
Nitrite: NEGATIVE
Specific Gravity, Urine: 1.01 (ref 1.000–1.03)
Squamous Epithelial / LPF: NEGATIVE /lpf
TSH: 1.21 microintl units/mL (ref 0.35–5.50)
Total Protein, Urine: NEGATIVE mg/dL
Urine Glucose: NEGATIVE mg/dL

## 2007-05-13 ENCOUNTER — Ambulatory Visit: Payer: Self-pay | Admitting: Cardiology

## 2007-06-03 ENCOUNTER — Ambulatory Visit: Payer: Self-pay | Admitting: Internal Medicine

## 2007-06-09 ENCOUNTER — Ambulatory Visit (HOSPITAL_COMMUNITY): Admission: RE | Admit: 2007-06-09 | Discharge: 2007-06-09 | Payer: Self-pay | Admitting: Internal Medicine

## 2007-06-09 LAB — CBC WITH DIFFERENTIAL/PLATELET
BASO%: 0.6 % (ref 0.0–2.0)
HCT: 42.1 % (ref 34.8–46.6)
LYMPH%: 18.2 % (ref 14.0–48.0)
MCH: 29.4 pg (ref 26.0–34.0)
MCHC: 34.2 g/dL (ref 32.0–36.0)
MCV: 85.8 fL (ref 81.0–101.0)
MONO#: 0.7 10*3/uL (ref 0.1–0.9)
MONO%: 9.8 % (ref 0.0–13.0)
NEUT%: 62.2 % (ref 39.6–76.8)
Platelets: 229 10*3/uL (ref 145–400)
WBC: 6.7 10*3/uL (ref 3.9–10.0)

## 2007-06-09 LAB — COMPREHENSIVE METABOLIC PANEL
Alkaline Phosphatase: 92 U/L (ref 39–117)
BUN: 21 mg/dL (ref 6–23)
Glucose, Bld: 110 mg/dL — ABNORMAL HIGH (ref 70–99)
Total Bilirubin: 0.7 mg/dL (ref 0.3–1.2)

## 2007-08-31 ENCOUNTER — Ambulatory Visit: Payer: Self-pay | Admitting: Internal Medicine

## 2007-09-02 ENCOUNTER — Ambulatory Visit (HOSPITAL_COMMUNITY): Admission: RE | Admit: 2007-09-02 | Discharge: 2007-09-02 | Payer: Self-pay | Admitting: Internal Medicine

## 2007-09-02 LAB — COMPREHENSIVE METABOLIC PANEL
Albumin: 4.2 g/dL (ref 3.5–5.2)
BUN: 28 mg/dL — ABNORMAL HIGH (ref 6–23)
CO2: 24 mEq/L (ref 19–32)
Calcium: 9.1 mg/dL (ref 8.4–10.5)
Chloride: 100 mEq/L (ref 96–112)
Creatinine, Ser: 1.86 mg/dL — ABNORMAL HIGH (ref 0.40–1.20)
Glucose, Bld: 103 mg/dL — ABNORMAL HIGH (ref 70–99)
Potassium: 3.9 mEq/L (ref 3.5–5.3)

## 2007-09-02 LAB — CBC WITH DIFFERENTIAL/PLATELET
Basophils Absolute: 0 10*3/uL (ref 0.0–0.1)
EOS%: 9.9 % — ABNORMAL HIGH (ref 0.0–7.0)
Eosinophils Absolute: 0.4 10*3/uL (ref 0.0–0.5)
HCT: 41.3 % (ref 34.8–46.6)
HGB: 14.2 g/dL (ref 11.6–15.9)
MCH: 28.9 pg (ref 26.0–34.0)
NEUT#: 2.7 10*3/uL (ref 1.5–6.5)
NEUT%: 61.3 % (ref 39.6–76.8)
lymph#: 0.7 10*3/uL — ABNORMAL LOW (ref 0.9–3.3)

## 2007-09-14 ENCOUNTER — Ambulatory Visit: Payer: Self-pay | Admitting: Cardiology

## 2008-01-05 ENCOUNTER — Ambulatory Visit: Payer: Self-pay | Admitting: Internal Medicine

## 2008-01-07 LAB — CBC WITH DIFFERENTIAL/PLATELET
Basophils Absolute: 0 10*3/uL (ref 0.0–0.1)
EOS%: 9 % — ABNORMAL HIGH (ref 0.0–7.0)
HCT: 41.1 % (ref 34.8–46.6)
HGB: 14 g/dL (ref 11.6–15.9)
MCH: 29.4 pg (ref 26.0–34.0)
MCV: 86.3 fL (ref 81.0–101.0)
MONO%: 10.9 % (ref 0.0–13.0)
NEUT%: 64.2 % (ref 39.6–76.8)

## 2008-01-07 LAB — COMPREHENSIVE METABOLIC PANEL
AST: 15 U/L (ref 0–37)
Alkaline Phosphatase: 99 U/L (ref 39–117)
BUN: 31 mg/dL — ABNORMAL HIGH (ref 6–23)
Calcium: 9 mg/dL (ref 8.4–10.5)
Chloride: 102 mEq/L (ref 96–112)
Creatinine, Ser: 1.98 mg/dL — ABNORMAL HIGH (ref 0.40–1.20)
Glucose, Bld: 98 mg/dL (ref 70–99)

## 2008-01-11 ENCOUNTER — Ambulatory Visit (HOSPITAL_COMMUNITY): Admission: RE | Admit: 2008-01-11 | Discharge: 2008-01-11 | Payer: Self-pay | Admitting: Internal Medicine

## 2008-04-07 ENCOUNTER — Ambulatory Visit: Payer: Self-pay | Admitting: Cardiology

## 2008-04-07 LAB — CONVERTED CEMR LAB
BUN: 39 mg/dL — ABNORMAL HIGH (ref 6–23)
Basophils Relative: 0.1 % (ref 0.0–3.0)
Creatinine, Ser: 2 mg/dL — ABNORMAL HIGH (ref 0.4–1.2)
Eosinophils Absolute: 0.8 10*3/uL — ABNORMAL HIGH (ref 0.0–0.7)
Eosinophils Relative: 12.5 % — ABNORMAL HIGH (ref 0.0–5.0)
GFR calc Af Amer: 31 mL/min
Glucose, Bld: 90 mg/dL (ref 70–99)
HCT: 40.1 % (ref 36.0–46.0)
Hemoglobin: 14 g/dL (ref 12.0–15.0)
MCV: 86.5 fL (ref 78.0–100.0)
Monocytes Absolute: 0.6 10*3/uL (ref 0.1–1.0)
Monocytes Relative: 9.4 % (ref 3.0–12.0)
Neutro Abs: 3.8 10*3/uL (ref 1.4–7.7)
Platelets: 220 10*3/uL (ref 150–400)
Potassium: 4.3 meq/L (ref 3.5–5.1)
RBC: 4.64 M/uL (ref 3.87–5.11)
WBC: 6.4 10*3/uL (ref 4.5–10.5)

## 2008-05-02 ENCOUNTER — Ambulatory Visit: Payer: Self-pay | Admitting: Cardiology

## 2008-05-02 LAB — CONVERTED CEMR LAB
BUN: 28 mg/dL — ABNORMAL HIGH (ref 6–23)
Calcium: 8.6 mg/dL (ref 8.4–10.5)
Chloride: 107 meq/L (ref 96–112)
Creatinine, Ser: 1.9 mg/dL — ABNORMAL HIGH (ref 0.4–1.2)
GFR calc non Af Amer: 28 mL/min

## 2008-05-19 ENCOUNTER — Ambulatory Visit: Payer: Self-pay | Admitting: Cardiology

## 2008-05-19 LAB — CONVERTED CEMR LAB
AST: 17 units/L (ref 0–37)
Alkaline Phosphatase: 84 units/L (ref 39–117)
Bilirubin, Direct: 0.1 mg/dL (ref 0.0–0.3)
Total CHOL/HDL Ratio: 9
VLDL: 36 mg/dL (ref 0–40)

## 2008-07-05 ENCOUNTER — Ambulatory Visit: Payer: Self-pay | Admitting: Internal Medicine

## 2008-07-07 ENCOUNTER — Ambulatory Visit (HOSPITAL_COMMUNITY): Admission: RE | Admit: 2008-07-07 | Discharge: 2008-07-07 | Payer: Self-pay | Admitting: Internal Medicine

## 2008-07-07 LAB — COMPREHENSIVE METABOLIC PANEL
AST: 18 U/L (ref 0–37)
Alkaline Phosphatase: 91 U/L (ref 39–117)
BUN: 20 mg/dL (ref 6–23)
Creatinine, Ser: 1.73 mg/dL — ABNORMAL HIGH (ref 0.40–1.20)
Total Bilirubin: 0.9 mg/dL (ref 0.3–1.2)

## 2008-07-07 LAB — CBC WITH DIFFERENTIAL/PLATELET
Basophils Absolute: 0 10*3/uL (ref 0.0–0.1)
EOS%: 12.4 % — ABNORMAL HIGH (ref 0.0–7.0)
Eosinophils Absolute: 0.8 10*3/uL — ABNORMAL HIGH (ref 0.0–0.5)
HGB: 15 g/dL (ref 11.6–15.9)
LYMPH%: 12.4 % — ABNORMAL LOW (ref 14.0–48.0)
MCH: 28.3 pg (ref 26.0–34.0)
MCV: 84.3 fL (ref 81.0–101.0)
MONO%: 8 % (ref 0.0–13.0)
NEUT#: 4.3 10*3/uL (ref 1.5–6.5)
Platelets: 185 10*3/uL (ref 145–400)
RBC: 5.29 10*6/uL (ref 3.70–5.32)
RDW: 13.5 % (ref 11.3–14.5)

## 2008-10-05 ENCOUNTER — Encounter: Payer: Self-pay | Admitting: Cardiology

## 2008-11-09 ENCOUNTER — Ambulatory Visit: Payer: Self-pay | Admitting: Internal Medicine

## 2008-11-21 ENCOUNTER — Ambulatory Visit: Payer: Self-pay

## 2008-11-21 ENCOUNTER — Encounter: Payer: Self-pay | Admitting: Cardiology

## 2008-11-30 ENCOUNTER — Ambulatory Visit: Payer: Self-pay | Admitting: Cardiology

## 2008-12-02 LAB — CONVERTED CEMR LAB
BUN: 42 mg/dL — ABNORMAL HIGH (ref 6–23)
Chloride: 103 meq/L (ref 96–112)
Glucose, Bld: 97 mg/dL (ref 70–99)
Potassium: 4.8 meq/L (ref 3.5–5.1)

## 2008-12-06 ENCOUNTER — Ambulatory Visit: Payer: Self-pay | Admitting: Cardiology

## 2008-12-08 LAB — CONVERTED CEMR LAB
BUN: 23 mg/dL (ref 6–23)
CO2: 30 meq/L (ref 19–32)
Calcium: 8.9 mg/dL (ref 8.4–10.5)
Creatinine, Ser: 1.6 mg/dL — ABNORMAL HIGH (ref 0.4–1.2)
Glucose, Bld: 100 mg/dL — ABNORMAL HIGH (ref 70–99)

## 2009-01-04 ENCOUNTER — Ambulatory Visit: Payer: Self-pay | Admitting: Internal Medicine

## 2009-01-06 ENCOUNTER — Ambulatory Visit (HOSPITAL_COMMUNITY): Admission: RE | Admit: 2009-01-06 | Discharge: 2009-01-06 | Payer: Self-pay | Admitting: Internal Medicine

## 2009-01-06 LAB — CBC WITH DIFFERENTIAL/PLATELET
BASO%: 0.4 % (ref 0.0–2.0)
EOS%: 14.3 % — ABNORMAL HIGH (ref 0.0–7.0)
LYMPH%: 13.8 % — ABNORMAL LOW (ref 14.0–49.7)
MCH: 28.1 pg (ref 25.1–34.0)
MCHC: 33 g/dL (ref 31.5–36.0)
MCV: 85.1 fL (ref 79.5–101.0)
MONO%: 8.6 % (ref 0.0–14.0)
NEUT#: 3.7 10*3/uL (ref 1.5–6.5)
Platelets: 196 10*3/uL (ref 145–400)
RBC: 5 10*6/uL (ref 3.70–5.45)
RDW: 13.6 % (ref 11.2–14.5)

## 2009-01-06 LAB — COMPREHENSIVE METABOLIC PANEL
AST: 20 U/L (ref 0–37)
Albumin: 3.9 g/dL (ref 3.5–5.2)
Alkaline Phosphatase: 84 U/L (ref 39–117)
Glucose, Bld: 100 mg/dL — ABNORMAL HIGH (ref 70–99)
Potassium: 4.1 mEq/L (ref 3.5–5.3)
Sodium: 137 mEq/L (ref 135–145)
Total Bilirubin: 0.9 mg/dL (ref 0.3–1.2)
Total Protein: 7.3 g/dL (ref 6.0–8.3)

## 2009-01-11 ENCOUNTER — Encounter: Payer: Self-pay | Admitting: Cardiology

## 2009-03-10 ENCOUNTER — Emergency Department (HOSPITAL_COMMUNITY): Admission: EM | Admit: 2009-03-10 | Discharge: 2009-03-10 | Payer: Self-pay | Admitting: Emergency Medicine

## 2009-03-30 ENCOUNTER — Ambulatory Visit: Payer: Self-pay | Admitting: Cardiology

## 2009-05-02 ENCOUNTER — Ambulatory Visit: Payer: Self-pay | Admitting: Cardiology

## 2009-05-02 ENCOUNTER — Ambulatory Visit: Payer: Self-pay

## 2009-05-02 ENCOUNTER — Ambulatory Visit (HOSPITAL_COMMUNITY): Admission: RE | Admit: 2009-05-02 | Discharge: 2009-05-02 | Payer: Self-pay | Admitting: Cardiology

## 2009-05-02 ENCOUNTER — Encounter: Payer: Self-pay | Admitting: Cardiology

## 2009-05-31 ENCOUNTER — Inpatient Hospital Stay (HOSPITAL_COMMUNITY): Admission: EM | Admit: 2009-05-31 | Discharge: 2009-06-02 | Payer: Self-pay | Admitting: Emergency Medicine

## 2009-06-03 HISTORY — PX: CHOLECYSTECTOMY: SHX55

## 2009-06-03 HISTORY — PX: ERCP: SHX60

## 2009-07-07 ENCOUNTER — Ambulatory Visit: Payer: Self-pay | Admitting: Internal Medicine

## 2009-07-11 ENCOUNTER — Ambulatory Visit (HOSPITAL_COMMUNITY): Admission: RE | Admit: 2009-07-11 | Discharge: 2009-07-11 | Payer: Self-pay | Admitting: Internal Medicine

## 2009-07-11 LAB — CBC WITH DIFFERENTIAL/PLATELET
BASO%: 0.5 % (ref 0.0–2.0)
Eosinophils Absolute: 0.9 10*3/uL — ABNORMAL HIGH (ref 0.0–0.5)
HCT: 41 % (ref 34.8–46.6)
MCHC: 33.7 g/dL (ref 31.5–36.0)
MONO#: 0.6 10*3/uL (ref 0.1–0.9)
NEUT#: 4.2 10*3/uL (ref 1.5–6.5)
NEUT%: 63.5 % (ref 38.4–76.8)
WBC: 6.6 10*3/uL (ref 3.9–10.3)
lymph#: 0.9 10*3/uL (ref 0.9–3.3)

## 2009-07-11 LAB — COMPREHENSIVE METABOLIC PANEL
ALT: 10 U/L (ref 0–35)
CO2: 30 mEq/L (ref 19–32)
Calcium: 9.1 mg/dL (ref 8.4–10.5)
Chloride: 103 mEq/L (ref 96–112)
Creatinine, Ser: 1.83 mg/dL — ABNORMAL HIGH (ref 0.40–1.20)
Glucose, Bld: 96 mg/dL (ref 70–99)
Sodium: 139 mEq/L (ref 135–145)
Total Protein: 7.3 g/dL (ref 6.0–8.3)

## 2009-07-12 ENCOUNTER — Encounter: Payer: Self-pay | Admitting: Cardiology

## 2009-07-30 ENCOUNTER — Emergency Department (HOSPITAL_COMMUNITY): Admission: EM | Admit: 2009-07-30 | Discharge: 2009-07-30 | Payer: Self-pay | Admitting: Emergency Medicine

## 2009-07-31 ENCOUNTER — Telehealth: Payer: Self-pay | Admitting: Cardiology

## 2009-08-02 ENCOUNTER — Encounter: Payer: Self-pay | Admitting: Cardiology

## 2009-08-07 ENCOUNTER — Encounter: Payer: Self-pay | Admitting: Cardiology

## 2009-09-06 ENCOUNTER — Ambulatory Visit: Payer: Self-pay | Admitting: Cardiology

## 2009-09-13 ENCOUNTER — Telehealth (INDEPENDENT_AMBULATORY_CARE_PROVIDER_SITE_OTHER): Payer: Self-pay | Admitting: *Deleted

## 2009-09-14 ENCOUNTER — Ambulatory Visit: Payer: Self-pay

## 2009-09-14 ENCOUNTER — Ambulatory Visit (HOSPITAL_COMMUNITY): Admission: RE | Admit: 2009-09-14 | Discharge: 2009-09-14 | Payer: Self-pay | Admitting: Cardiology

## 2009-09-14 ENCOUNTER — Encounter: Payer: Self-pay | Admitting: Cardiovascular Disease

## 2009-09-14 ENCOUNTER — Encounter (HOSPITAL_COMMUNITY): Admission: RE | Admit: 2009-09-14 | Discharge: 2009-11-02 | Payer: Self-pay | Admitting: Cardiology

## 2009-09-14 ENCOUNTER — Encounter: Payer: Self-pay | Admitting: Cardiology

## 2009-09-14 ENCOUNTER — Ambulatory Visit: Payer: Self-pay | Admitting: Cardiology

## 2009-09-15 ENCOUNTER — Ambulatory Visit: Payer: Self-pay | Admitting: Cardiology

## 2009-09-15 ENCOUNTER — Telehealth (INDEPENDENT_AMBULATORY_CARE_PROVIDER_SITE_OTHER): Payer: Self-pay | Admitting: *Deleted

## 2009-09-25 ENCOUNTER — Telehealth: Payer: Self-pay | Admitting: Cardiology

## 2009-10-04 ENCOUNTER — Ambulatory Visit: Payer: Self-pay | Admitting: Internal Medicine

## 2009-10-05 ENCOUNTER — Ambulatory Visit (HOSPITAL_COMMUNITY): Admission: RE | Admit: 2009-10-05 | Discharge: 2009-10-05 | Payer: Self-pay | Admitting: Internal Medicine

## 2009-10-05 LAB — CBC WITH DIFFERENTIAL/PLATELET
BASO%: 0.3 % (ref 0.0–2.0)
Basophils Absolute: 0 10*3/uL (ref 0.0–0.1)
Eosinophils Absolute: 1.3 10*3/uL — ABNORMAL HIGH (ref 0.0–0.5)
HCT: 41.2 % (ref 34.8–46.6)
HGB: 13.6 g/dL (ref 11.6–15.9)
LYMPH%: 17.2 % (ref 14.0–49.7)
MONO#: 0.5 10*3/uL (ref 0.1–0.9)
NEUT%: 56.2 % (ref 38.4–76.8)
Platelets: 288 10*3/uL (ref 145–400)
WBC: 7 10*3/uL (ref 3.9–10.3)
lymph#: 1.2 10*3/uL (ref 0.9–3.3)

## 2009-10-05 LAB — COMPREHENSIVE METABOLIC PANEL
ALT: 52 U/L — ABNORMAL HIGH (ref 0–35)
BUN: 33 mg/dL — ABNORMAL HIGH (ref 6–23)
CO2: 30 mEq/L (ref 19–32)
Calcium: 9.1 mg/dL (ref 8.4–10.5)
Chloride: 103 mEq/L (ref 96–112)
Creatinine, Ser: 1.61 mg/dL — ABNORMAL HIGH (ref 0.40–1.20)
Glucose, Bld: 94 mg/dL (ref 70–99)
Total Bilirubin: 0.8 mg/dL (ref 0.3–1.2)

## 2009-10-12 ENCOUNTER — Encounter: Payer: Self-pay | Admitting: Cardiology

## 2009-10-26 ENCOUNTER — Telehealth: Payer: Self-pay | Admitting: Cardiology

## 2009-11-02 ENCOUNTER — Telehealth: Payer: Self-pay | Admitting: Cardiology

## 2009-11-14 ENCOUNTER — Encounter (INDEPENDENT_AMBULATORY_CARE_PROVIDER_SITE_OTHER): Payer: Self-pay | Admitting: Surgery

## 2009-11-14 ENCOUNTER — Inpatient Hospital Stay (HOSPITAL_COMMUNITY): Admission: RE | Admit: 2009-11-14 | Discharge: 2009-11-16 | Payer: Self-pay | Admitting: Surgery

## 2009-11-27 ENCOUNTER — Ambulatory Visit: Payer: Self-pay | Admitting: Cardiology

## 2009-11-28 LAB — CONVERTED CEMR LAB
BUN: 22 mg/dL (ref 6–23)
Basophils Absolute: 0.1 10*3/uL (ref 0.0–0.1)
Basophils Relative: 0.4 % (ref 0.0–3.0)
Calcium: 8.4 mg/dL (ref 8.4–10.5)
Creatinine, Ser: 1.6 mg/dL — ABNORMAL HIGH (ref 0.4–1.2)
Eosinophils Absolute: 0.3 10*3/uL (ref 0.0–0.7)
GFR calc non Af Amer: 33.46 mL/min (ref >60)
HCT: 37 % (ref 36.0–46.0)
Hemoglobin: 12.3 g/dL (ref 12.0–15.0)
Lymphocytes Relative: 6.4 % — ABNORMAL LOW (ref 12.0–46.0)
Lymphs Abs: 0.8 10*3/uL (ref 0.7–4.0)
MCHC: 33.3 g/dL (ref 30.0–36.0)
Neutro Abs: 10.6 10*3/uL — ABNORMAL HIGH (ref 1.4–7.7)
RBC: 4.3 M/uL (ref 3.87–5.11)
RDW: 14.3 % (ref 11.5–14.6)
Specific Gravity, Urine: 1.02 (ref 1.000–1.030)
Urine Glucose: NEGATIVE mg/dL
Urobilinogen, UA: 1 (ref 0.0–1.0)
WBC: 12.8 10*3/uL — ABNORMAL HIGH (ref 4.5–10.5)
pH: 6.5 (ref 5.0–8.0)

## 2009-12-15 ENCOUNTER — Encounter: Payer: Self-pay | Admitting: Cardiology

## 2009-12-22 ENCOUNTER — Encounter: Payer: Self-pay | Admitting: Cardiology

## 2010-02-12 ENCOUNTER — Encounter: Payer: Self-pay | Admitting: Cardiology

## 2010-03-21 ENCOUNTER — Ambulatory Visit: Payer: Self-pay | Admitting: Cardiology

## 2010-04-03 ENCOUNTER — Ambulatory Visit: Payer: Self-pay | Admitting: Internal Medicine

## 2010-04-05 ENCOUNTER — Encounter: Payer: Self-pay | Admitting: Cardiology

## 2010-04-05 ENCOUNTER — Ambulatory Visit (HOSPITAL_COMMUNITY): Admission: RE | Admit: 2010-04-05 | Discharge: 2010-04-05 | Payer: Self-pay | Admitting: Internal Medicine

## 2010-04-05 LAB — COMPREHENSIVE METABOLIC PANEL
ALT: 9 U/L (ref 0–35)
CO2: 26 mEq/L (ref 19–32)
Calcium: 9.3 mg/dL (ref 8.4–10.5)
Chloride: 102 mEq/L (ref 96–112)
Potassium: 4.4 mEq/L (ref 3.5–5.3)
Sodium: 139 mEq/L (ref 135–145)
Total Bilirubin: 0.4 mg/dL (ref 0.3–1.2)
Total Protein: 7.4 g/dL (ref 6.0–8.3)

## 2010-04-05 LAB — CBC WITH DIFFERENTIAL/PLATELET
BASO%: 0.8 % (ref 0.0–2.0)
MCHC: 34.4 g/dL (ref 31.5–36.0)
MONO#: 0.5 10*3/uL (ref 0.1–0.9)
RBC: 4.83 10*6/uL (ref 3.70–5.45)
WBC: 5.8 10*3/uL (ref 3.9–10.3)
lymph#: 0.9 10*3/uL (ref 0.9–3.3)

## 2010-04-10 ENCOUNTER — Encounter: Payer: Self-pay | Admitting: Cardiology

## 2010-04-12 ENCOUNTER — Encounter: Payer: Self-pay | Admitting: Cardiology

## 2010-04-20 ENCOUNTER — Telehealth: Payer: Self-pay | Admitting: Cardiology

## 2010-05-18 ENCOUNTER — Encounter: Payer: Self-pay | Admitting: Cardiology

## 2010-06-23 ENCOUNTER — Encounter: Payer: Self-pay | Admitting: Internal Medicine

## 2010-06-23 ENCOUNTER — Other Ambulatory Visit: Payer: Self-pay | Admitting: Internal Medicine

## 2010-06-23 DIAGNOSIS — C3490 Malignant neoplasm of unspecified part of unspecified bronchus or lung: Secondary | ICD-10-CM

## 2010-06-24 ENCOUNTER — Encounter: Payer: Self-pay | Admitting: Internal Medicine

## 2010-06-24 ENCOUNTER — Encounter: Payer: Self-pay | Admitting: Critical Care Medicine

## 2010-06-27 ENCOUNTER — Telehealth: Payer: Self-pay | Admitting: Cardiology

## 2010-07-01 LAB — CONVERTED CEMR LAB
Chloride: 106 meq/L (ref 96–112)
GFR calc non Af Amer: 28.82 mL/min (ref >60)
Potassium: 4.5 meq/L (ref 3.5–5.1)
Sodium: 141 meq/L (ref 135–145)

## 2010-07-03 NOTE — Progress Notes (Signed)
Summary: Ptr request call about surgery  Phone Note Call from Patient Call back at Home Phone [phone]   Caller: Patient Reason for Call: Talk to Nurse Summary of Call: pt request call Initial call taken by: Judie Grieve,  September 25, 2009 8:46 AM  Follow-up for Phone Call        Phone line busy. Julieta Gutting, RN, BSN  September 25, 2009 9:32 AM  I spoke with the pt and she had another gallbladder "attack" this weekend.  The pt is in the process of scheduling her surgery and wanted to make sure Dr Riley Kill will be in town on certain dates.  The pt thinks she has a UTI. The pt also said her legs are bothering her like when she took cholesterol medications.  I asked her to follow-up with her PCP for evaluation.  The pt agrees with plan.   Follow-up by: Julieta Gutting, RN, BSN,  September 25, 2009 10:31 AM

## 2010-07-03 NOTE — Progress Notes (Signed)
  Faxed ROI over to [facility] fax [phone],CB [phone] [facility]  September 15, 2009 11:20 AM    Appended Document:  Recieved Records back from [facility] on [employer]

## 2010-07-03 NOTE — Letter (Signed)
Summary: [facility]  [facility]   Imported By: Sherian Rein 04/23/2010 14:39:07  _____________________________________________________________________  External Attachment:    Type:   Image     Comment:   External Document

## 2010-07-03 NOTE — Assessment & Plan Note (Signed)
Summary: f34m/surgical clearance   Visit Type:  Follow-up  CC:  Surgical clearance- gallbladder.  History of Present Illness: Patient had urinary tract infection, and was hurting in the abdomen.  Was found to have gallstones, and has had three bouts of it.  She got real sick at the time.  They have told her she needs cholecystectomy.  They want to take her gallbaladder out.  She is not having chest pain, and is able to get out and walk, and not been having any central chest pain whatsoever.    Current Medications (verified): 1)  Plavix 75 Mg Tabs (Clopidogrel Bisulfate) .... Take One Daily 2)  Coreg 25 Mg Tabs (Carvedilol) .Marland Kitchen.. 1 By Mouth Two Times A Day 3)  Protonix 40 Mg Tbec (Pantoprazole Sodium) .... Take One Daily 4)  Indocin 25 Mg/10ml Susp (Indomethacin) .... Take As Needed  Allergies: 1)  ! Keflex 2)  ! Asa 3)  ! Cipro 4)  ! Penicillin 5)  ! Sulfa 6)  ! * Statins 7)  ! Iron 8)  ! Cephalexin 9)  ! Adhesive Tape 10)  ! * Shellfish  Past History:  Past Medical History: Last updated: 07/31/2008 Stage IV non small cell ca of lung Hypertension Hypercholesterolemia CKD CAD s/p PCI RCA December 2006 GERD Chronic anemia  Past Surgical History: Last updated: 07/31/2008 Surgery for groin hematoma December 2006 Appendectomy Cataract surgery Hysterectomy Hemorroidectomy Laminectomy  Family History: Last updated: 07/31/2008 Brother and sister with lung CA Brother with CA of stomach Mother CHF Father deceased complications of gall bladder surgery  Social History: Last updated: 07/31/2008 Widow with 2 children Quit smoking fifteen years ago Denied ETOH use  Review of Systems       The patient complains of abdominal pain.  The patient denies anorexia, fever, weight loss, weight gain, vision loss, decreased hearing, hoarseness, headaches, hemoptysis, melena, hematochezia, and severe indigestion/heartburn.    Vital Signs:  Patient profile:   77 year old  female Height:      68.5 inches Weight:      160.25 pounds BMI:     24.10 Pulse rate:   57 / minute Pulse rhythm:   regular Resp:     18 per minute BP sitting:   170 / 80  (left arm) Cuff size:   large  Vitals Entered By: Vikki Ports (September 06, 2009 12:44 PM)  Physical Exam  General:  Well developed, well nourished, in no acute distress. Head:  normocephalic and atraumatic Eyes:  PERRLA/EOM intact; conjunctiva and lids normal. Lungs:  Clear bilaterally to auscultation and percussion. Heart:  Normal S1 and S2.  Prominent S4.  No def murmur.  Abdomen:  Bowel sounds positive; abdomen soft and non-tender without masses, organomegaly, or hernias noted. No hepatosplenomegaly. Pulses:  pulses normal in all 4 extremities Extremities:  No clubbing or cyanosis. Neurologic:  Alert and oriented x 3.   EKG  Procedure date:  09/06/2009  Findings:      NSR.  First degree av block.  Nonspecific St abnormality.  EKG  Procedure date:  06/04/2005  Findings:       CONCLUSION:  Successful percutaneous stenting of the right coronary artery  using a nondrug-eluting platform due to inability to take aspirin.   DISPOSITION:  The patient will be treated medically. We may arrange for  follow-up in [place].   Impression & Recommendations:  Problem # 1:  CAD, NATIVE VESSEL (ICD-414.01)  There is likely some increased risk, but currently she is not  having any major symptoms with exercise.  She does more than 4 mets easily.  No murmur.  Would likely have some increased risk, but not prohibitive in terms of surgery.   Would likely do nuc and echo before surgery, as her daughter is very worried, and we know she has CAD.   Her updated medication list for this problem includes:    Plavix 75 Mg Tabs (Clopidogrel bisulfate) .Marland Kitchen... Take one daily    Coreg 25 Mg Tabs (Carvedilol) .Marland Kitchen... 1 by mouth two times a day  Orders: Nuclear Stress Test (Nuc Stress Test) Echocardiogram (Echo)  Problem # 2:   HYPERTENSION, BENIGN (ICD-401.1)  High here but not at home.  Stay on Coreg through the procedure.   Her updated medication list for this problem includes:    Coreg 25 Mg Tabs (Carvedilol) .Marland Kitchen... 1 by mouth two times a day  Orders: Nuclear Stress Test (Nuc Stress Test) Echocardiogram (Echo)  Patient Instructions: 1)  Your physician has requested that you have an echocardiogram.  Echocardiography is a painless test that uses sound waves to create images of your heart. It provides your doctor with information about the size and shape of your heart and how well your heart's chambers and valves are working.  This procedure takes approximately one hour. There are no restrictions for this procedure. 2)  Your physician has requested that you have an lexiscan myoview.  For further information please visit [email or web address]  Please follow instruction sheet, as given. 3)  Your physician recommends that you schedule a follow-up appointment with Dr Riley Kill April 15,2011.

## 2010-07-03 NOTE — Assessment & Plan Note (Signed)
Summary: ROV   Visit Type:  Follow-up  CC:  Test results.  History of Present Illness: Patient is not having any chest pain.  She will get mild shortwinded with alot of steps.  Patient wants to have her surgery.  Her activity level is good.  We reviewed her syndromes, and also reviewed her elective studies.  The worry is the risk of sepsis is real.  Patient is pain free.  Current Medications (verified): 1)  Plavix 75 Mg Tabs (Clopidogrel Bisulfate) .... Take One Daily 2)  Coreg 25 Mg Tabs (Carvedilol) .Marland Kitchen.. 1 By Mouth Two Times A Day 3)  Protonix 40 Mg Tbec (Pantoprazole Sodium) .... Take One Daily 4)  Indocin 25 Mg/14ml Susp (Indomethacin) .... Take As Needed  Allergies: 1)  ! Keflex 2)  ! Asa 3)  ! Cipro 4)  ! Penicillin 5)  ! Sulfa 6)  ! * Statins 7)  ! Iron 8)  ! Cephalexin 9)  ! * Cath Contrast 10)  ! Adhesive Tape 11)  ! * Shellfish  Vital Signs:  Patient profile:   77 year old female Height:      68.5 inches Weight:      160.25 pounds BMI:     24.10 Pulse rate:   64 / minute Pulse rhythm:   regular Resp:     18 per minute BP sitting:   138 / 70  (left arm) Cuff size:   large  Vitals Entered By: Vikki Ports (September 15, 2009 8:51 AM)  Physical Exam  General:  Well developed, well nourished, in no acute distress. Head:  normocephalic and atraumatic Eyes:  PERRLA/EOM intact; conjunctiva and lids normal. Neck:  No JVD. Lungs:  Clear bilaterally to auscultation and percussion. Heart:  Normal S1 and S2.  No def murmur.   Abdomen:  Bowel sounds positive; abdomen soft and non-tender without masses, organomegaly, or hernias noted. No hepatosplenomegaly.   Nuclear ETT  Procedure date:  09/14/2009  Findings:      Quantitative Gated Spect Images  QGS EDV:  129 ml QGS ESV:  62 ml QGS EF:  52 % QGS cine images:  Normal wall motion; mild left ventricular enlargement.   Overall Impression   Exercise Capacity: Lexiscan study with no exercise. BP Response:  Normal blood pressure response. Clinical Symptoms: No chest pain ECG Impression: No significant ST segment change suggestive of ischemia. Overall Impression: Abnormal lexiscan nuclear study with soft tissue attenuation; also prior inferior infarct and mild peri-infarct ischemia.    Signed by Ferman Hamming, MD, [facility] on 09/14/2009 at 4:14 PM   Echocardiogram  Procedure date:  09/14/2009  Findings:       Study Conclusions            - Left ventricle: The cavity size was normal. Wall thickness was       normal. Systolic function was mildly reduced. The estimated       ejection fraction was in the range of 45% to 50%. Diffuse       hypokinesis. Features are consistent with a pseudonormal left       ventricular filling pattern, with concomitant abnormal relaxation       and increased filling pressure (grade 2 diastolic dysfunction).     - Aortic valve: Mild regurgitation.     - Mitral valve: Mild regurgitation.     - Left atrium: The atrium was mildly dilated.         Cardiac Cath  Procedure date:  06/04/2005  Findings:       CONCLUSION:  Successful percutaneous stenting of the right coronary artery  using a nondrug-eluting platform due to inability to take aspirin.   DISPOSITION:  The patient will be treated medically. We may arrange for  follow-up in [place].   Impression & Recommendations:  Problem # 1:  CAD, NATIVE VESSEL (ICD-414.01) Doing well at this time.  No major symptoms.  Scar with some mild ischemia inferiorly, but clinically stable.  Would continue medical therapy.  Plavix could be stopped for surgery.  I would like to be available, and favor overnight admission for monitoring first night. Her updated medication list for this problem includes:    Plavix 75 Mg Tabs (Clopidogrel bisulfate) .Marland Kitchen... Take one daily    Coreg 25 Mg Tabs (Carvedilol) .Marland Kitchen... 1 by mouth two times a day  Problem # 2:  HYPERTENSION, BENIGN (ICD-401.1) controlled at present.   Her  updated medication list for this problem includes:    Coreg 25 Mg Tabs (Carvedilol) .Marland Kitchen... 1 by mouth two times a day  Problem # 3:  DEPENDENT EDEMA (ICD-782.3) Minimal at this point.    Problem # 4:  lung cancer Folllowed by Dr. Gwenyth Bouillon.  Make increase risk of surgery, but should be stable.  Patient Instructions: 1)  Your physician recommends that you schedule a follow-up appointment in: 2 MONTHS 2)  Your physician recommends that you continue on your current medications as directed. Please refer to the Current Medication list given to you today. 3)  Call [facility] to schedule surgery.

## 2010-07-03 NOTE — Progress Notes (Signed)
Summary: [facility]  [facility]   Imported By: Earl Many 05/04/2010 17:12:51  _____________________________________________________________________  External Attachment:    Type:   Image     Comment:   External Document

## 2010-07-03 NOTE — Assessment & Plan Note (Signed)
Summary: f49m   Visit Type:  2 mo f/u  CC:  pt states she has had a kidney infection since 04/2009...Marland KitchenMarland KitchenMarland Kitchenpt c/o weakness...no other complaints today.  History of Present Illness: Had gallbladder surgery.  Had a second procedure done as well.  Doing reasonably well.  She has had a recurrent urinary tract infection.  No chest pain at present.  She was in the hospital several days.     Current Medications (verified): 1)  Plavix 75 Mg Tabs (Clopidogrel Bisulfate) .... Take One Daily 2)  Coreg 25 Mg Tabs (Carvedilol) .Marland Kitchen.. 1 By Mouth Two Times A Day 3)  Protonix 40 Mg Tbec (Pantoprazole Sodium) .... Take One Daily 4)  Metronidazole 500 Mg Tabs (Metronidazole) .Marland Kitchen.. 1 Tab Two Times A Day  Allergies: 1)  ! Keflex 2)  ! Asa 3)  ! Cipro 4)  ! Penicillin 5)  ! Sulfa 6)  ! * Statins 7)  ! Iron 8)  ! Cephalexin 9)  ! * Cath Contrast 10)  ! Adhesive Tape 11)  ! * Shellfish  Past History:  Past Medical History: Last updated: 07/31/2008 Stage IV non small cell ca of lung Hypertension Hypercholesterolemia CKD CAD s/p PCI RCA December 2006 GERD Chronic anemia  Past Surgical History: Last updated: 07/31/2008 Surgery for groin hematoma December 2006 Appendectomy Cataract surgery Hysterectomy Hemorroidectomy Laminectomy  Family History: Last updated: 07/31/2008 Brother and sister with lung CA Brother with CA of stomach Mother CHF Father deceased complications of gall bladder surgery  Social History: Last updated: 07/31/2008 Widow with 2 children Quit smoking fifteen years ago Denied ETOH use  Vital Signs:  Patient profile:   77 year old female Height:      68.5 inches Weight:      157 pounds Pulse rate:   62 / minute Pulse rhythm:   regular BP sitting:   116 / 62  (left arm) Cuff size:   regular  Vitals Entered By: Danielle Rankin, CMA (November 27, 2009 9:23 AM)  Physical Exam  General:  Well developed, well nourished, in no acute distress. Head:  normocephalic and  atraumatic Eyes:  PERRLA/EOM intact; conjunctiva and lids normal. Lungs:  Clear bilaterally to auscultation and percussion. Heart:  PMI non displaced.  Normal S1 and S2.  Minimal SEM.  No diastolic murmur or rub.   Abdomen:  Healing incisions.  Fullness RUQ without erythema.   Pulses:  pulses normal in all 4 extremities Extremities:  No clubbing or cyanosis. Neurologic:  Alert and oriented x 3.   Impression & Recommendations:  Problem # 1:  CAD, NATIVE VESSEL (ICD-414.01)  No symptoms during or after surgery. Her updated medication list for this problem includes:    Plavix 75 Mg Tabs (Clopidogrel bisulfate) .Marland Kitchen... Take one daily    Coreg 25 Mg Tabs (Carvedilol) .Marland Kitchen... 1 by mouth two times a day  Orders: T-Culture, Urine ([id number]) Urology Referral (Urology) TLB-BMP (Basic Metabolic Panel-BMET) (80048-METABOL) TLB-CBC Platelet - w/Differential (85025-CBCD) TLB-Udip ONLY (81003-UDIP)  Problem # 2:  HYPERTENSION, BENIGN (ICD-401.1)  well controlled.  Down about seven pounds. Her updated medication list for this problem includes:    Coreg 25 Mg Tabs (Carvedilol) .Marland Kitchen... 1 by mouth two times a day  Orders: T-Culture, Urine ([id number]) Urology Referral (Urology) TLB-BMP (Basic Metabolic Panel-BMET) (80048-METABOL) TLB-CBC Platelet - w/Differential (85025-CBCD) TLB-Udip ONLY (81003-UDIP)  Problem # 3:  CHRONIC KIDNEY DISEASE STAGE III (MODERATE) (ICD-585.3)  reluctant to go back to Nephrology  Orders: T-Culture, Urine ([id number]) Urology Referral (Urology) TLB-BMP (Basic  Metabolic Panel-BMET) (80048-METABOL) TLB-CBC Platelet - w/Differential (85025-CBCD) TLB-Udip ONLY (81003-UDIP)  Problem # 4:  ACUTE CYSTITIS (ICD-595.0) says she has had recurrent issues.  Wants to see Urology.  Need to check UA and CNS.  Her updated medication list for this problem includes:    Metronidazole 500 Mg Tabs (Metronidazole) .Marland Kitchen... 1 tab two times a day  Patient Instructions: 1)  Your  physician recommends that you schedule a follow-up appointment in: 3 MONTHS with Dr Riley Kill 2)  Your physician recommends that you have lab work today: CBC, BMP, UA with C & S 3)  Your physician recommends that you continue on your current medications as directed. Please refer to the Current Medication list given to you today. 4)  You have been referred to Urology.

## 2010-07-03 NOTE — Progress Notes (Signed)
Summary: gall bladder surgery  Phone Note Call from Patient Call back at Home Phone [phone]   Caller: Patient Reason for Call: Talk to Nurse Summary of Call: states she really needs to speak to Lauren, having surgery on her gallbladder Initial call taken by: Migdalia Dk,  July 31, 2009 9:53 AM  Follow-up for Phone Call        I spoke with the pt and she has had 3 "attacks" with her gallbladder since December.  The pt was seen in the ER this weekend with an "attack".  The pt was told to call [facility] by the ER.  The pt wanted to know if she sees the surgeon will they speak to Dr Riley Kill about her.  I told the pt that the surgeon would request cardiac clearance from Dr Riley Kill.  I asked the pt to call the surgeons office and schedule an appt.  Pt agrees with plan.  The pt also mentioned that the ER physician said her liver function was elevated and this could be related to the spot on her liver.  I asked her to discuss this issue with Dr Arbutus Ped. Follow-up by: Julieta Gutting, RN, BSN,  July 31, 2009 11:27 AM

## 2010-07-03 NOTE — Progress Notes (Signed)
Summary: [facility]  [facility]   Imported By: Debby Freiberg 11/25/2009 10:08:32  _____________________________________________________________________  External Attachment:    Type:   Image     Comment:   External Document

## 2010-07-03 NOTE — Progress Notes (Signed)
Summary: Swelling  Phone Note Call from Patient Call back at Home Phone [phone]   Caller: Patient Summary of Call: pt wants to discuss someproblems that she is having. pt due to have surery in june wants to talk to you.  Initial call taken by: Edman Circle,  Oct 26, 2009 12:48 PM  Follow-up for Phone Call        The pt is scheduled to have her surgery on 11/14/09 at [facility].  The pt does c/o swelling in her feet and legs. The pt's swelling gets worse as the day progresses.    The pt states she has lost 4 pounds and is watching her salt intake.  I once again instructed the pt to elevate her legs when she is sitting during the day and get support hose.  The pt does not take diuretics at this time due to her kidney function.  I gave the pt information about support hose at her last appt. The pt will try to elevate her legs and get hose.  Follow-up by: Julieta Gutting, RN, BSN,  Oct 26, 2009 2:37 PM

## 2010-07-03 NOTE — Assessment & Plan Note (Signed)
Summary: ROV   Visit Type:  Follow-up Primary Provider:  None  CC:  No cardiac complaints.  History of Present Illness: Patient looks and feels good.  She had some issues in September, was dehydrated, but is now better.  No chest pain at present.  Has not had plavix in nearly two weeks---ran out and could not afford.  She checks BP at home, and it is generally 135/64 and HR 60 at home.  Using Trimethoprim per Dr. Vonita Moss.  Current Medications (verified): 1)  Plavix 75 Mg Tabs (Clopidogrel Bisulfate) .... Take One Daily 2)  Coreg 25 Mg Tabs (Carvedilol) .Marland Kitchen.. 1 By Mouth Two Times A Day 3)  Protonix 40 Mg Tbec (Pantoprazole Sodium) .... Take One Daily 4)  Trimethoprim 100 Mg Tabs (Trimethoprim) .Marland Kitchen.. 1 Tab At Bedtime  Allergies: 1)  ! Keflex 2)  ! Asa 3)  ! Cipro 4)  ! Penicillin 5)  ! Sulfa 6)  ! * Statins 7)  ! Iron 8)  ! Cephalexin 9)  ! * Cath Contrast 10)  ! Adhesive Tape 11)  ! * Shellfish  Past History:  Past Medical History: Last updated: 07/31/2008 Stage IV non small cell ca of lung Hypertension Hypercholesterolemia CKD CAD s/p PCI RCA December 2006 GERD Chronic anemia  Past Surgical History: Last updated: 07/31/2008 Surgery for groin hematoma December 2006 Appendectomy Cataract surgery Hysterectomy Hemorroidectomy Laminectomy  Family History: Last updated: 07/31/2008 Brother and sister with lung CA Brother with CA of stomach Mother CHF Father deceased complications of gall bladder surgery  Social History: Last updated: 07/31/2008 Widow with 2 children Quit smoking fifteen years ago Denied ETOH use  Vital Signs:  Patient profile:   77 year old female Height:      68.5 inches Weight:      159 pounds BMI:     23.91 Pulse rate:   58 / minute Pulse rhythm:   irregular Resp:     18 per minute BP sitting:   160 / 88  (left arm) Cuff size:   large  Vitals Entered By: Vikki Ports (March 21, 2010 10:09 AM)  Physical Exam  General:  Well  developed, well nourished, in no acute distress. Head:  normocephalic and atraumatic Eyes:  PERRLA/EOM intact; conjunctiva and lids normal. Lungs:  Clear bilaterally to auscultation and percussion. Heart:  SEM  2/6.  No DM.   Abdomen:  Bowel sounds positive; abdomen soft and non-tender without masses, organomegaly, or hernias noted. No hepatosplenomegaly. Pulses:  pulses normal in all 4 extremities Extremities:  No clubbing or cyanosis. Neurologic:  Alert and oriented x 3.   EKG  Procedure date:  03/21/2010  Findings:      SB.  Inferior MI, old   Impression & Recommendations:  Problem # 1:  CAD, NATIVE VESSEL (ICD-414.01)  No recurrence.  Sometimes does not take plavix.  Jaw swells up with ASA.  Her updated medication list for this problem includes:    Plavix 75 Mg Tabs (Clopidogrel bisulfate) .Marland Kitchen... Take one daily    Coreg 25 Mg Tabs (Carvedilol) .Marland Kitchen... 1 by mouth two times a day  Orders: TLB-BMP (Basic Metabolic Panel-BMET) (80048-METABOL)  Problem # 2:  HYPERTENSION, BENIGN (ICD-401.1)  better control per patient at home Her updated medication list for this problem includes:    Coreg 25 Mg Tabs (Carvedilol) .Marland Kitchen... 1 by mouth two times a day  Orders: TLB-BMP (Basic Metabolic Panel-BMET) (80048-METABOL)  Problem # 3:  CHRONIC KIDNEY DISEASE STAGE III (MODERATE) (ICD-585.3) Needs to  check BMET checked.   Patient Instructions: 1)  Your physician recommends that you have lab work today: BMP 2)  Your physician recommends that you continue on your current medications as directed. Please refer to the Current Medication list given to you today. 3)  Your physician wants you to follow-up in:  6 MONTHS.  You will receive a reminder letter in the mail two months in advance. If you don't receive a letter, please call our office to schedule the follow-up appointment. 4)  Please have Vitamin D level checked by Primary Care Physician.

## 2010-07-03 NOTE — Letter (Signed)
Summary: [facility] for [facility]  [facility] - [facility] for Medical Clearance   Imported By: Marylou Mccoy 10/31/2009 15:52:32  _____________________________________________________________________  External Attachment:    Type:   Image     Comment:   External Document

## 2010-07-03 NOTE — Consult Note (Signed)
Summary: [facility]  [facility]   Imported By: Marylou Mccoy 10/02/2009 13:54:49  _____________________________________________________________________  External Attachment:    Type:   Image     Comment:   External Document

## 2010-07-03 NOTE — Progress Notes (Signed)
Summary: Kidney function  Phone Note Call from Patient   Caller: Patient  Reason for Call: Talk to Nurse Summary of Call[facility] [phone]-returning lauren's call Initial call taken by: Glynda Jaeger,  April 20, 2010 8:49 AM  Follow-up for Phone Call        I spoke with pt and made her aware of lab results from 04/05/10.   The pt said she has seen Dr Vonita Moss this month at [facility].  The pt also sees Dr Caryn Section at [facility].  The pt would like to see if Dr Vonita Moss could see her for kidney function instead of Dr Caryn Section.  I have left a message at both [facility] and [facility] to see about making an appt for pt.  Follow-up by: Julieta Gutting, RN, BSN,  April 20, 2010 9:42 AM  Additional Follow-up for Phone Call Additional follow up Details #1::        Rodman Comp [phone] from [facility] called to let me know that they have documentation in there system that the pt refuses to see Dr Caryn Section and did not want that office to contact the pt about future appointments. I made Shequina aware that the pt felt like Dr Caryn Section did not help her with some of her issues and was not comfortable with the care he provided her.  Rodman Comp said she could switch the pt to another nephrologist but would have to get approval from Dr Caryn Section first.  She will speak with him next week and call me back.  We will try to get the pt an appt with Dr Kathrene Bongo if possible.  Additional Follow-up by: Julieta Gutting, RN, BSN,  April 20, 2010 2:16 PM    Additional Follow-up for Phone Call Additional follow up Details #2::    I spoke with the pt and made her aware that I am waiting to hear from [facility] about appt.  The pt said she had seen Dr Briant Cedar at the hospital and did not know if she could get appt with him.  I will wait for phone call from [facility] to discuss appt.  Julieta Gutting, RN, BSN  April 23, 2010 10:25 AM  Rodman Comp called back and Dr Kathrene Bongo will  accept the pt from Dr Caryn Section.  They will mail the pt an appt card once scheduled.  They would like recent OV and labs faxed to [phone]. Julieta Gutting, RN, BSN  April 23, 2010 2:10 PM  I called [facility] and they have not scheduled this pt an appt with Dr Kathrene Bongo.  I left a message for Ninetta Lights to call back about appt for pt. Records were faxed to [facility].  Julieta Gutting, RN, BSN  May 08, 2010 9:38 AM   Additional Follow-up for Phone Call Additional follow up Details #3:: Details for Additional Follow-up Action Taken: Ninetta Lights called back to let me know that this pt is scheduled to see Dr Kathrene Bongo on 06/12/2010 at 4:15. I left a message on the pt's voicemail about appt with Dr Kathrene Bongo.  Additional Follow-up by: Julieta Gutting, RN, BSN,  May 09, 2010 12:25 PM

## 2010-07-03 NOTE — Letter (Signed)
Summary: [facility] Cardiac Cath Report  [facility] Cardiac Cath Report   Imported By: Roderic Ovens 10/31/2009 10:20:30  _____________________________________________________________________  External Attachment:    Type:   Image     Comment:   External Document

## 2010-07-03 NOTE — Consult Note (Signed)
Summary: Consultation Report  Consultation Report   Imported By: Marylou Mccoy 10/31/2009 15:23:31  _____________________________________________________________________  External Attachment:    Type:   Image     Comment:   External Document

## 2010-07-03 NOTE — Assessment & Plan Note (Signed)
Summary: Cardiology Nuclear Study  Nuclear Med Background Indications for Stress Test: Evaluation for Ischemia, Surgical Clearance, Stent Patency  Indications Comments: Pending gallbladder surgery  History: Abnormal EKG, Echo, Heart Catheterization, Stents  History Comments: Anormal Ekg 1st degree avb NS ST changes '07 Heart Cath '07 Stents RCA  11/10 ECHO EF 50-55%  Symptoms: Palpitations    Nuclear Pre-Procedure Cardiac Risk Factors: History of Smoking, Hypertension, Lipids Caffeine/Decaff Intake: None NPO After: 6:30 PM Lungs: clear IV 0.9% NS with Angio Cath: 18G     IV Site: (R) AC IV Started by: Irean Hong RN Chest Size (in) 38     Cup Size B     Height (in): 68.5 Weight (lb): 158 BMI: 23.76 Tech Comments: Held coreg 24 hrs.  Nuclear Med Study 1 or 2 day study:  1 day     Stress Test Type:  Eugenie Birks Reading MD:  Olga Millers, MD     Referring MD:  T.Stuckey Resting Radionuclide:  Technetium 53m Tetrofosmin     Resting Radionuclide Dose:  11 mCi  Stress Radionuclide:  Technetium 5m Tetrofosmin     Stress Radionuclide Dose:  33 mCi   Stress Protocol   Lexiscan: 0.4 mg   Stress Test Technologist:  Milana Na EMT-P     Nuclear Technologist:  Domenic Polite CNMT  Rest Procedure  Myocardial perfusion imaging was performed at rest 45 minutes following the intravenous administration of Myoview Technetium 72m Tetrofosmin.  Stress Procedure  The patient received IV Lexiscan 0.4 mg over 15-seconds.  Myoview injected at 30-seconds.  There were no significant changes, + pressure in her head, and rare pacs/pvcs with infusion.  Quantitative spect images were obtained after a 45 minute delay.  QPS Raw Data Images:  There is no interference from other nuclear activity. Stress Images:  There is decreased uptake in the anterior wall, apex and inferior wall. Rest Images:  There is decreased uptake in the anterior wall, apex, and inferior wall; inferior defect less  prominent compared to the stress images. Subtraction (SDS):  These findings are consistent with soft tissue attenuation; also prior inferior infarct and mild peri-infarct  ischemia. Transient Ischemic Dilatation:  1.02  (Normal <1.22)  Lung/Heart Ratio:  .27  (Normal <0.45)  Quantitative Gated Spect Images QGS EDV:  129 ml QGS ESV:  62 ml QGS EF:  52 % QGS cine images:  Normal wall motion; mild left ventricular enlargement.   Overall Impression  Exercise Capacity: Lexiscan study with no exercise. BP Response: Normal blood pressure response. Clinical Symptoms: No chest pain ECG Impression: No significant ST segment change suggestive of ischemia. Overall Impression: Abnormal lexiscan nuclear study with soft tissue attenuation; also prior inferior infarct and mild peri-infarct ischemia.  Appended Document: Cardiology Nuclear Study This looks ok.  Patient I beleive is scheduled for surgery.  TS  Appended Document: Cardiology Nuclear Study Pt aware of results by phone.  Dr Riley Kill also reviewed these results with the pt at her OV.  The pt is in the process of scheduling her gallbladder surgery.

## 2010-07-03 NOTE — Assessment & Plan Note (Signed)
Summary: ROV   Visit Type:  3  mo f/u  CC:  denies any cardiac complaints today.  History of Present Illness: Patient did not stay for a visit today because of our time backup.  She will reschedule.  She understood and was in good spirits.    Current Medications (verified): 1)  Plavix 75 Mg Tabs (Clopidogrel Bisulfate) .... Take One Daily 2)  Coreg 25 Mg Tabs (Carvedilol) .Marland Kitchen.. 1 By Mouth Two Times A Day 3)  Protonix 40 Mg Tbec (Pantoprazole Sodium) .... Take One Daily 4)  Trimethoprim 100 Mg Tabs (Trimethoprim) .Marland Kitchen.. 1 Tab At Bedtime  Allergies: 1)  ! Keflex 2)  ! Asa 3)  ! Cipro 4)  ! Penicillin 5)  ! Sulfa 6)  ! * Statins 7)  ! Iron 8)  ! Cephalexin 9)  ! * Cath Contrast 10)  ! Adhesive Tape 11)  ! * Shellfish  Vital Signs:  Patient profile:   77 year old female Height:      68.5 inches Weight:      159 pounds BMI:     23.91 Pulse rate:   59 / minute Pulse rhythm:   irregular BP sitting:   124 / 72  (left arm) Cuff size:   large  Vitals Entered By: Danielle Rankin, CMA (February 12, 2010 10:37 AM)   Patient Instructions: 1)  This pt was not seen by Dr Riley Kill due to MD running behind due to emergency.

## 2010-07-03 NOTE — Progress Notes (Signed)
Summary: stop plavix  Phone Note From Other Clinic   Caller: NUrse Germaine Summary of Call: Per Germaine [facility] [phone] Ofc got clearance for surgery but needs to know if she can be off her Plavix for 5 to 7 days.  Initial call taken by: Edman Circle,  September 25, 2009 10:52 AM  Follow-up for Phone Call        Per Dr Riley Kill the pt does not have a drug eluting stent.  The pt can hold Plavix 5-7 days prior to surgery.  Julieta Gutting, RN, BSN  September 26, 2009 6:36 PM  I spoke with Kendell Bane and made her aware of Dr Rosalyn Charters recommendations.  I will fax this note to [phone]. Follow-up by: Julieta Gutting, RN, BSN,  September 27, 2009 9:41 AM

## 2010-07-03 NOTE — Progress Notes (Signed)
Summary: Nuclear Pre-Procedure  Phone Note Outgoing Call   Call placed by: Milana Na, EMT-P,  September 13, 2009 3:39 PM Summary of Call: Left message with information on Myoview Information Sheet (see scanned document for details).      Nuclear Med Background Indications for Stress Test: Evaluation for Ischemia, Surgical Clearance, Stent Patency  Indications Comments: Pending gallbladder surgery  History: Abnormal EKG, Echo, Heart Catheterization, Stents  History Comments: Anormal Ekg 1st degree avb NS ST changes '07 Heart Cath '07 Stents RCA  11/10 ECHO EF 50-55%     Nuclear Pre-Procedure Cardiac Risk Factors: History of Smoking, Hypertension, Lipids Height (in): 68.5  Nuclear Med Study Referring MD:  T.Stuckey

## 2010-07-03 NOTE — Progress Notes (Signed)
Summary: plavix  Phone Note Call from Patient Call back at Home Phone [phone]   Caller: Patient Reason for Call: Talk to Nurse Summary of Call: request to speak to nurse about plavix, has not taking plavix in over 10 days..... is it ok to start back now before surgery Initial call taken by: Migdalia Dk,  November 02, 2009 8:28 AM  Follow-up for Phone Call        I spoke with the pt and she has not taken plavix for the last week because she could not afford the medication.  The pt is going to pick-up her plavix today.  The pt is having gallbladder surgery on 11/14/09 and will hold Plavix 5-7 days prior.  I instructed the pt to start back on her Plavix today and then stop on 11/07/09 in preparation for surgery. Pt agrees with plan.  Follow-up by: Julieta Gutting, RN, BSN,  November 02, 2009 9:35 AM

## 2010-07-05 NOTE — Progress Notes (Signed)
Summary: pt saw kidney dr yesterday  Phone Note Call from Patient   Caller: Patient [phone] Summary of Call: pt saw kidney dr yesterday and wanted to discuss some info with you Initial call taken by: Glynda Jaeger,  June 27, 2010 1:29 PM  Follow-up for Phone Call        I spoke with the pt and she saw Dr Kathrene Bongo yesterday.  They told the pt that she did not need to be seen for 1 year.  The pt said she had a more recent creatinine check and it was 1.7 (this is stable for pt). Follow-up by: Julieta Gutting, RN, BSN,  June 27, 2010 2:50 PM

## 2010-07-06 NOTE — Letter (Signed)
[facility]   Imported By: Roderic Ovens 08/22/2009 16:00:04  _____________________________________________________________________  External Attachment:    Type:   Image     Comment:   External Document

## 2010-07-25 ENCOUNTER — Telehealth: Payer: Self-pay | Admitting: Cardiology

## 2010-07-25 NOTE — Consult Note (Signed)
Summary: [facility]: Consultation Report  [facility]: Consultation Report   Imported By: Earl Many 07/20/2010 18:41:59  _____________________________________________________________________  External Attachment:    Type:   Image     Comment:   External Document

## 2010-07-31 NOTE — Progress Notes (Signed)
Summary: Office Visit  Office Visit   Imported By: Earl Many 07/27/2010 10:03:28  _____________________________________________________________________  External Attachment:    Type:   Image     Comment:   External Document

## 2010-08-01 ENCOUNTER — Ambulatory Visit (INDEPENDENT_AMBULATORY_CARE_PROVIDER_SITE_OTHER): Payer: Medicare Other | Admitting: Physician Assistant

## 2010-08-01 ENCOUNTER — Encounter: Payer: Self-pay | Admitting: Physician Assistant

## 2010-08-01 ENCOUNTER — Other Ambulatory Visit: Payer: Self-pay | Admitting: Cardiology

## 2010-08-01 ENCOUNTER — Other Ambulatory Visit: Payer: Medicare Other

## 2010-08-01 DIAGNOSIS — R609 Edema, unspecified: Secondary | ICD-10-CM

## 2010-08-01 DIAGNOSIS — R0602 Shortness of breath: Secondary | ICD-10-CM

## 2010-08-01 DIAGNOSIS — I2510 Atherosclerotic heart disease of native coronary artery without angina pectoris: Secondary | ICD-10-CM

## 2010-08-01 DIAGNOSIS — I10 Essential (primary) hypertension: Secondary | ICD-10-CM

## 2010-08-01 LAB — BASIC METABOLIC PANEL
Calcium: 9.5 mg/dL (ref 8.4–10.5)
GFR: 23.75 mL/min — ABNORMAL LOW (ref >60.00)
Glucose, Bld: 93 mg/dL (ref 70–99)
Sodium: 141 mEq/L (ref 135–145)

## 2010-08-08 ENCOUNTER — Telehealth: Payer: Self-pay | Admitting: Cardiology

## 2010-08-09 ENCOUNTER — Telehealth: Payer: Self-pay | Admitting: Cardiology

## 2010-08-09 NOTE — Assessment & Plan Note (Signed)
Summary: follow-up on fluid status/lwb   Visit Type:  Follow-up Primary Provider:  None  CC:  shortness of breath and swelling in feet.  History of Present Illness: Primary Cardiologist:  Dr. Shawnie Pons  [name] is a 77 yo female with a h/o CAD, status post bare-metal stent to the RCA in 2007, hypertension, hyperlipidemia, GERD, anemia and chronic kidney disease as well as stage IV non-small cell lung cancer.  Last echocardiogram April 2011: EF 45-50%; grade 2 diastolic dysfunction; mild AI, mild MR, mild LAE.  Last Myoview study in April 2011: EF 52%; inferior scar with mild peri-infarct ischemia; low risk study.  She called in recently with weight gain and swelling.  Dr. Riley Kill called in Lasix and asked for her followup today.  She has not yet started the Lasix.  By our scales, she is up 4 pounds since last being seen.  She notes a mild increase in shortness of breath with exertion.  She describes NYHA class II symptoms.  She denies orthopnea, PND.  She really just has mild ankle edema.  She denies syncope or near-syncope.  She denies any chest discomfort.  Of note, she had gallbladder surgery last summer.  Since then, her diet has increased.  She does feel like she has gained some weight in relation to this.  She feels as though she can attribute some of her shortness of breath with exertion to this.  Current Medications (verified): 1)  Plavix 75 Mg Tabs (Clopidogrel Bisulfate) .... Take One Daily 2)  Coreg 25 Mg Tabs (Carvedilol) .Marland Kitchen.. 1 By Mouth Two Times A Day 3)  Protonix 40 Mg Tbec (Pantoprazole Sodium) .... Take One Daily 4)  Furosemide 20 Mg Tabs (Furosemide) .... Take One Tablet By Mouth Every Other Day As Needed  Allergies (verified): 1)  ! Keflex 2)  ! Asa 3)  ! Cipro 4)  ! Penicillin 5)  ! Sulfa 6)  ! * Statins 7)  ! Iron 8)  ! Cephalexin 9)  ! * Cath Contrast 10)  ! Adhesive Tape 11)  ! * Shellfish  Past History:  Past Medical History: Last updated:  07/31/2008 Stage IV non small cell ca of lung Hypertension Hypercholesterolemia CKD CAD s/p PCI RCA December 2006 GERD Chronic anemia  Review of Systems       As per  the HPI.  All other systems reviewed and negative.   Vital Signs:  Patient profile:   77 year old female Height:      68.5 inches Weight:      163.25 pounds BMI:     24.55 Pulse rate:   60 / minute BP sitting:   176 / 79  (left arm) Cuff size:   regular  Vitals Entered By: Caralee Ates CMA (August 01, 2010 9:40 AM)  Physical Exam  General:  Well nourished, well developed, in no acute distress HEENT: normal Neck: no JVD Cardiac:  normal S1, S2; RRR; no murmur Lungs:  clear to auscultation bilaterally, no wheezing, rhonchi or rales Abd: soft, nontender, no hepatomegaly Ext: trace ankle edema Vascular: no carotid  bruits Skin: warm and dry Neuro:  CNs 2-12 intact, no focal abnormalities noted    EKG  Procedure date:  08/01/2010  Findings:      sinus bradycardia  Heart rate 54 Left axis deviation First degree AV block with a PR interval of 236 ms Biphasic T waves in leads 2, 3, aVF and V5-V6 Poor wave progression No significant change when compared tracing from  September 2011  Impression & Recommendations:  Problem # 1:  DYSPNEA (ICD-786.05) This is overall mild.  It is likely related to mild weight gains.  She has mild LV dysfunction as well as diastolic dysfunction.  She is at risk for mixed systolic and diastolic heart failure.  I will check a BNP today.  On exam, she does not appear to be volume overloaded.  She can take Lasix as needed for now.  If her BNP is significantly elevated, we will consider placing her on daily Lasix.  She apparently had problems with gout exacerbation the past for this.  She will have an EKG today (no significant change on present EKG when compared to past EKG).  She had a nonischemic Myoview study in April 2011.  Problem # 2:  DEPENDENT EDEMA (ICD-782.3) As above.   Check a basic metabolic panel and a BNP today. Orders: TLB-BNP (B-Natriuretic Peptide) (83880-BNPR)  Problem # 3:  HYPERTENSION, BENIGN (ICD-401.1) Ucontrolled.  Repeat blood pressure by me is 154/80.  I suspect she is not on an ACE inhibitor secondary to chronic kidney disease.  She is followed by nephrology.  I will await the results of her basic metabolic panel.  I will also try to obtain records from nephrology.  Consider adding amlodipine.  Orders: TLB-BMP (Basic Metabolic Panel-BMET) (80048-METABOL)  Problem # 4:  CAD, NATIVE VESSEL (ICD-414.01) Stable.  No apparent angina.  Check EKG today.  Follow up with Dr. Riley Kill in April.  Orders: EKG w/ Interpretation (93000) TLB-BNP (B-Natriuretic Peptide) (83880-BNPR)  Problem # 5:  CHRONIC KIDNEY DISEASE STAGE III (MODERATE) (ICD-585.3) Check basic metabolic panel today.  Obtain records from nephrology.  Patient Instructions: 1)  Your physician recommends that you schedule a follow-up appointment in: APRIL WITH DR. Riley Kill AS PER [name] Yamamoto, PA-C.  2)  Your physician recommends that you return for lab work in: TODAY BMET 786.05, 414.01, 782.3, BNP 782.3, 786.05, 414.01. 3)  Your physician recommends that you continue on your current medications as directed. Please refer to the Current Medication list given to you today.  Appended Document: follow-up on fluid status/lwb Rec'd notes from Nephrology.  BPs at home noted to be normal at OV in 06/2010. Furosemide increased recently due to volume overload. Consider amlodipine at f/u visit in April if BP still elevated.

## 2010-08-09 NOTE — Progress Notes (Signed)
Summary: c/o swelling in feet/ ankles  Phone Note Call from Patient Call back at Home Phone [phone]   Refills Requested: Medication #1:  PLAVIX 75 MG TABS take one daily Caller: Patient Reason for Call: Talk to Nurse Summary of Call: c/o swelling in feet/ ankles. can a fluid pill be called in [facility] [place] .  Initial call taken by: Lorne Skeens,  July 25, 2010 8:48 AM  Follow-up for Phone Call        I spoke with the pt and she had a little bit of swelling when she saw Dr Kathrene Bongo and she was not concerned.  The pt would like to get a Rx for prn furosemide 20mg .  The pt does watch her salt intake and elevate feet.  The pt said she is not real swollen at this time.  The pt c/o bilateral swelling in her legs and notices this is worse if she eats at [employer].  The pt has gained 6-7 lbs since Christmas, but has been eating more than normal.  The pt gets SOB when walking outside but contributes this to cold weather.  The pt denies SOB when inside.  The pt also has noticed that her pants are tight around her stomach and has had to start wearing a bigger size.  I will speak with Dr Riley Kill about the pt's symptoms.  Follow-up by: Julieta Gutting, RN, BSN,  July 25, 2010 1:12 PM  Additional Follow-up for Phone Call Additional follow up Details #1::        Dr Riley Kill aware and the pt can have a Rx for Furosemide 20mg  every other day as needed (the pt has a history on renal insufficiency). Dr Riley Kill would like the pt to be seen by the PA for symptoms. Left message for pt to call back. Julieta Gutting, RN, BSN  July 25, 2010 6:50 PM  Left message for pt to call back about PA appt. Julieta Gutting, RN, BSN  July 26, 2010 3:46 PM    Additional Follow-up for Phone Call Additional follow up Details #2::    Call back at [phone]   Caller: Patient Reason for Call: Talk to Nurse, Talk to Doctor Summary of Call: pt rtn call / [facility] make sure to let them  know she is there and to ask her to come to the phone Initial call taken by: Omer Jack,  July 30, 2010 8:51 AM  I spoke with the pt and scheduled her to see the PA on 08/01/10 at 9:30. Follow-up by: Julieta Gutting, RN, BSN,  July 30, 2010 9:37 AM  New/Updated Medications: FUROSEMIDE 20 MG TABS (FUROSEMIDE) Take one tablet by mouth every other day as needed Prescriptions: FUROSEMIDE 20 MG TABS (FUROSEMIDE) Take one tablet by mouth every other day as needed  #30 x 2   Entered by:   Julieta Gutting, RN, BSN   Authorized by:   Ronaldo Miyamoto, MD, [facility]   Signed by:   Julieta Gutting, RN, BSN on 07/25/2010   Method used:   Electronically to        [facility]  [address] (retail)       [address]       [place], [place]  [address]       Ph: [phone]       Fax: [phone]   RxID:   [id number] PLAVIX 75 MG TABS (CLOPIDOGREL BISULFATE) take one daily  #  90 Each x 3   Entered by:   Julieta Gutting, RN, BSN   Authorized by:   Ronaldo Miyamoto, MD, [facility]   Signed by:   Julieta Gutting, RN, BSN on 07/25/2010   Method used:   Electronically to        [facility] [address] (retail)       [address]       [place], [place]  [address]       Ph: [phone]       Fax: [phone]   RxID:   [id number]

## 2010-08-13 ENCOUNTER — Encounter: Payer: Self-pay | Admitting: Cardiology

## 2010-08-13 ENCOUNTER — Other Ambulatory Visit: Payer: Self-pay | Admitting: Cardiology

## 2010-08-13 ENCOUNTER — Other Ambulatory Visit (INDEPENDENT_AMBULATORY_CARE_PROVIDER_SITE_OTHER): Payer: Medicare Other

## 2010-08-13 DIAGNOSIS — I10 Essential (primary) hypertension: Secondary | ICD-10-CM

## 2010-08-13 LAB — BASIC METABOLIC PANEL
BUN: 38 mg/dL — ABNORMAL HIGH (ref 6–23)
Chloride: 104 mEq/L (ref 96–112)
Potassium: 4.2 mEq/L (ref 3.5–5.1)

## 2010-08-14 NOTE — Progress Notes (Signed)
Summary: BP readings  ---- Converted from flag ---- ---- 08/09/2010 9:09 AM, Tereso Newcomer PA-C wrote: No.  I looked at her note from the neph.  She has had normal BPs at home.  We adjusted her lasix.  I would just keep this med. in mind if her BP remains up at her next visit. Scott   ---- 08/08/2010 9:44 AM, Julieta Gutting, RN, BSN wrote: just wanted to follow-up from your appt--you mentioned starting the pt on Amlodipine but wanted to wait on 2/29 labwork---do you want to start this med? ------------------------------  Phone Note Outgoing Call   Call placed by: Julieta Gutting, RN, BSN,  August 09, 2010 9:54 AM Call placed to: Patient Summary of Call: I spoke with the pt and her BP this morning was 111/68. The pt said she had taken one of her friend's Lisinopril because her BP was high earlier in the week.  I made the pt aware that she cannot take this medication because of it's effects on the kidneys.  I instructed the pt to keep a record of her BPs at home and bring them into the office for her appt with Dr Riley Kill in April. Pt agreed with plan. The pt will contact the office earlier if she consistently has elevated BP readings.   Initial call taken by: Julieta Gutting, RN, BSN,  August 09, 2010 10:00 AM

## 2010-08-14 NOTE — Progress Notes (Signed)
Summary: question re meds and change of meds  Phone Note Call from Patient Call back at [phone]   Caller: Patient Reason for Call: Talk to Nurse Summary of Call: pt states she wants to take lisinopril for her blood pressure.  if so can you please call it in to her pharmacy.  Initial call taken by: Roe Coombs,  August 08, 2010 9:11 AM  Follow-up for Phone Call        Waiting on response from [facility].  He mentioned starting the pt on Amlodipine for BP control. Julieta Gutting, RN, BSN  August 08, 2010 5:20 PM  Please see Scott's response in following phone note.  Follow-up by: Julieta Gutting, RN, BSN,  August 09, 2010 9:50 AM

## 2010-08-19 LAB — URINE CULTURE: Colony Count: >=100000

## 2010-08-19 LAB — COMPREHENSIVE METABOLIC PANEL
ALT: 153 U/L — ABNORMAL HIGH (ref 0–35)
AST: 105 U/L — ABNORMAL HIGH (ref 0–37)
Alkaline Phosphatase: 219 U/L — ABNORMAL HIGH (ref 39–117)
BUN: 29 mg/dL — ABNORMAL HIGH (ref 6–23)
CO2: 27 mEq/L (ref 19–32)
Calcium: 8.1 mg/dL — ABNORMAL LOW (ref 8.4–10.5)
Chloride: 102 mEq/L (ref 96–112)
GFR calc Af Amer: 29 mL/min — ABNORMAL LOW (ref >60)
Glucose, Bld: 205 mg/dL — ABNORMAL HIGH (ref 70–99)
Potassium: 3.8 mEq/L (ref 3.5–5.1)
Potassium: 4 mEq/L (ref 3.5–5.1)
Sodium: 132 mEq/L — ABNORMAL LOW (ref 135–145)
Total Bilirubin: 4.4 mg/dL — ABNORMAL HIGH (ref 0.3–1.2)
Total Protein: 5.8 g/dL — ABNORMAL LOW (ref 6.0–8.3)

## 2010-08-19 LAB — LIPASE, BLOOD: Lipase: 28 U/L (ref 11–59)

## 2010-08-19 LAB — URINALYSIS, MICROSCOPIC ONLY
Glucose, UA: NEGATIVE mg/dL
Ketones, ur: 15 mg/dL — AB
pH: 5.5 (ref 5.0–8.0)

## 2010-08-19 LAB — CBC
HCT: 38.1 % (ref 36.0–46.0)
Hemoglobin: 12.9 g/dL (ref 12.0–15.0)
MCHC: 33.8 g/dL (ref 30.0–36.0)
RBC: 4.1 MIL/uL (ref 3.87–5.11)
RDW: 13.9 % (ref 11.5–15.5)
WBC: 9.1 10*3/uL (ref 4.0–10.5)

## 2010-08-19 LAB — AMYLASE: Amylase: 35 U/L (ref 0–105)

## 2010-08-20 LAB — CBC
HCT: 40.8 % (ref 36.0–46.0)
MCHC: 32.9 g/dL (ref 30.0–36.0)
MCV: 86 fL (ref 78.0–100.0)
Platelets: 181 10*3/uL (ref 150–400)
RDW: 14.1 % (ref 11.5–15.5)

## 2010-08-20 LAB — DIFFERENTIAL
Basophils Absolute: 0 10*3/uL (ref 0.0–0.1)
Lymphocytes Relative: 17 % (ref 12–46)
Lymphs Abs: 1.1 10*3/uL (ref 0.7–4.0)
Monocytes Absolute: 0.7 10*3/uL (ref 0.1–1.0)
Monocytes Relative: 10 % (ref 3–12)
Neutro Abs: 3.9 10*3/uL (ref 1.7–7.7)

## 2010-08-20 LAB — COMPREHENSIVE METABOLIC PANEL
Albumin: 3.7 g/dL (ref 3.5–5.2)
BUN: 28 mg/dL — ABNORMAL HIGH (ref 6–23)
Calcium: 9 mg/dL (ref 8.4–10.5)
Creatinine, Ser: 1.91 mg/dL — ABNORMAL HIGH (ref 0.4–1.2)
Total Bilirubin: 0.6 mg/dL (ref 0.3–1.2)
Total Protein: 6.8 g/dL (ref 6.0–8.3)

## 2010-08-20 LAB — APTT: aPTT: 28 seconds (ref 24–37)

## 2010-08-22 LAB — DIFFERENTIAL
Basophils Absolute: 0 10*3/uL (ref 0.0–0.1)
Basophils Relative: 0 % (ref 0–1)
Eosinophils Absolute: 0.2 10*3/uL (ref 0.0–0.7)
Eosinophils Relative: 3 % (ref 0–5)
Monocytes Absolute: 0.5 10*3/uL (ref 0.1–1.0)
Neutro Abs: 6.3 10*3/uL (ref 1.7–7.7)

## 2010-08-22 LAB — URINALYSIS, ROUTINE W REFLEX MICROSCOPIC
Protein, ur: 30 mg/dL — AB
Urobilinogen, UA: 2 mg/dL — ABNORMAL HIGH (ref 0.0–1.0)

## 2010-08-22 LAB — URINE CULTURE

## 2010-08-22 LAB — CBC
HCT: 43.2 % (ref 36.0–46.0)
Platelets: 218 10*3/uL (ref 150–400)
WBC: 7.6 10*3/uL (ref 4.0–10.5)

## 2010-08-22 LAB — COMPREHENSIVE METABOLIC PANEL
AST: 79 U/L — ABNORMAL HIGH (ref 0–37)
Albumin: 3.4 g/dL — ABNORMAL LOW (ref 3.5–5.2)
Alkaline Phosphatase: 384 U/L — ABNORMAL HIGH (ref 39–117)
BUN: 20 mg/dL (ref 6–23)
Chloride: 105 mEq/L (ref 96–112)
Potassium: 3.9 mEq/L (ref 3.5–5.1)
Total Bilirubin: 3.4 mg/dL — ABNORMAL HIGH (ref 0.3–1.2)

## 2010-08-25 ENCOUNTER — Other Ambulatory Visit: Payer: Self-pay | Admitting: Cardiology

## 2010-08-27 ENCOUNTER — Encounter: Payer: Self-pay | Admitting: Cardiology

## 2010-08-27 ENCOUNTER — Other Ambulatory Visit: Payer: Self-pay

## 2010-08-27 DIAGNOSIS — K219 Gastro-esophageal reflux disease without esophagitis: Secondary | ICD-10-CM

## 2010-08-27 DIAGNOSIS — I10 Essential (primary) hypertension: Secondary | ICD-10-CM

## 2010-08-27 MED ORDER — PANTOPRAZOLE SODIUM 40 MG PO TBEC: 40.0000 mg | DELAYED_RELEASE_TABLET | Freq: Every day | ORAL | Status: DC

## 2010-08-27 MED ORDER — CARVEDILOL 25 MG PO TABS: 25.0000 mg | ORAL_TABLET | Freq: Two times a day (BID) | ORAL | Status: DC

## 2010-08-30 NOTE — Telephone Encounter (Signed)
[address] °

## 2010-09-03 LAB — DIFFERENTIAL
Basophils Relative: 1 % (ref 0–1)
Eosinophils Absolute: 0.4 10*3/uL (ref 0.0–0.7)
Lymphs Abs: 0.7 10*3/uL (ref 0.7–4.0)
Monocytes Absolute: 0.5 10*3/uL (ref 0.1–1.0)
Monocytes Relative: 7 % (ref 3–12)
Neutrophils Relative %: 77 % (ref 43–77)

## 2010-09-03 LAB — CBC
HCT: 35.5 % — ABNORMAL LOW (ref 36.0–46.0)
HCT: 38 % (ref 36.0–46.0)
MCHC: 33 g/dL (ref 30.0–36.0)
MCHC: 33.5 g/dL (ref 30.0–36.0)
MCV: 84.6 fL (ref 78.0–100.0)
Platelets: 220 10*3/uL (ref 150–400)
Platelets: 251 10*3/uL (ref 150–400)
RBC: 4.2 MIL/uL (ref 3.87–5.11)
RDW: 14.1 % (ref 11.5–15.5)
WBC: 4.7 10*3/uL (ref 4.0–10.5)

## 2010-09-03 LAB — URINALYSIS, ROUTINE W REFLEX MICROSCOPIC
Glucose, UA: NEGATIVE mg/dL
Protein, ur: NEGATIVE mg/dL
Specific Gravity, Urine: 1.023 (ref 1.005–1.030)
Urobilinogen, UA: 1 mg/dL (ref 0.0–1.0)

## 2010-09-03 LAB — COMPREHENSIVE METABOLIC PANEL
Albumin: 3.1 g/dL — ABNORMAL LOW (ref 3.5–5.2)
Alkaline Phosphatase: 336 U/L — ABNORMAL HIGH (ref 39–117)
BUN: 21 mg/dL (ref 6–23)
BUN: 27 mg/dL — ABNORMAL HIGH (ref 6–23)
CO2: 26 mEq/L (ref 19–32)
Calcium: 8.5 mg/dL (ref 8.4–10.5)
Calcium: 8.8 mg/dL (ref 8.4–10.5)
Chloride: 106 mEq/L (ref 96–112)
Creatinine, Ser: 1.65 mg/dL — ABNORMAL HIGH (ref 0.4–1.2)
Creatinine, Ser: 1.79 mg/dL — ABNORMAL HIGH (ref 0.4–1.2)
GFR calc Af Amer: 37 mL/min — ABNORMAL LOW (ref >60)
GFR calc non Af Amer: 30 mL/min — ABNORMAL LOW (ref >60)
Glucose, Bld: 101 mg/dL — ABNORMAL HIGH (ref 70–99)
Potassium: 4 mEq/L (ref 3.5–5.1)
Total Bilirubin: 1.7 mg/dL — ABNORMAL HIGH (ref 0.3–1.2)
Total Protein: 6.8 g/dL (ref 6.0–8.3)

## 2010-09-03 LAB — URINE CULTURE: Colony Count: >=100000

## 2010-09-03 LAB — PHOSPHORUS: Phosphorus: 3.8 mg/dL (ref 2.3–4.6)

## 2010-09-03 LAB — URINE MICROSCOPIC-ADD ON

## 2010-09-06 LAB — CBC
Platelets: 215 10*3/uL (ref 150–400)
RBC: 4.55 MIL/uL (ref 3.87–5.11)
WBC: 7.4 10*3/uL (ref 4.0–10.5)

## 2010-09-06 LAB — DIFFERENTIAL
Eosinophils Absolute: 0.5 10*3/uL (ref 0.0–0.7)
Lymphocytes Relative: 14 % (ref 12–46)
Lymphs Abs: 1 10*3/uL (ref 0.7–4.0)
Monocytes Relative: 12 % (ref 3–12)
Neutro Abs: 5 10*3/uL (ref 1.7–7.7)
Neutrophils Relative %: 68 % (ref 43–77)

## 2010-09-06 LAB — POCT I-STAT, CHEM 8
Calcium, Ion: 1.11 mmol/L — ABNORMAL LOW (ref 1.12–1.32)
Creatinine, Ser: 1.8 mg/dL — ABNORMAL HIGH (ref 0.4–1.2)
Glucose, Bld: 96 mg/dL (ref 70–99)
HCT: 38 % (ref 36.0–46.0)
Hemoglobin: 12.9 g/dL (ref 12.0–15.0)
Potassium: 3.9 mEq/L (ref 3.5–5.1)
Sodium: 139 mEq/L (ref 135–145)

## 2010-09-06 LAB — URIC ACID: Uric Acid, Serum: 7.3 mg/dL — ABNORMAL HIGH (ref 2.4–7.0)

## 2010-09-11 ENCOUNTER — Ambulatory Visit (INDEPENDENT_AMBULATORY_CARE_PROVIDER_SITE_OTHER): Payer: Medicare Other | Admitting: Cardiology

## 2010-09-11 ENCOUNTER — Encounter: Payer: Self-pay | Admitting: Cardiology

## 2010-09-11 DIAGNOSIS — C3490 Malignant neoplasm of unspecified part of unspecified bronchus or lung: Secondary | ICD-10-CM

## 2010-09-11 DIAGNOSIS — I5022 Chronic systolic (congestive) heart failure: Secondary | ICD-10-CM

## 2010-09-11 DIAGNOSIS — R609 Edema, unspecified: Secondary | ICD-10-CM

## 2010-09-11 DIAGNOSIS — E785 Hyperlipidemia, unspecified: Secondary | ICD-10-CM

## 2010-09-11 DIAGNOSIS — I10 Essential (primary) hypertension: Secondary | ICD-10-CM

## 2010-09-11 DIAGNOSIS — I2510 Atherosclerotic heart disease of native coronary artery without angina pectoris: Secondary | ICD-10-CM

## 2010-09-11 LAB — BASIC METABOLIC PANEL
BUN: 34 mg/dL — ABNORMAL HIGH (ref 6–23)
CO2: 30 mEq/L (ref 19–32)
Calcium: 9 mg/dL (ref 8.4–10.5)
Creatinine, Ser: 2 mg/dL — ABNORMAL HIGH (ref 0.4–1.2)

## 2010-09-11 MED ORDER — AMLODIPINE BESYLATE 5 MG PO TABS: 5.0000 mg | ORAL_TABLET | Freq: Every day | ORAL | Status: DC

## 2010-09-11 NOTE — Patient Instructions (Addendum)
Your physician has requested that you have a lower extremity venous duplex. This test is an ultrasound of the veins in the legs. It looks at venous blood flow that carries blood from the heart to the legs. Allow one hour for a Lower Venous exam.  There are no restrictions or special instructions.  Your physician recommends that you have lab work today: BMP  Your physician wants you to follow-up in: 6 MONTHS.  You will receive a reminder letter in the mail two months in advance. If you don't receive a letter, please call our office to schedule the follow-up appointment.  Your physician has recommended you make the following change in your medication: START Amlodipine 5mg  once a day

## 2010-09-12 ENCOUNTER — Ambulatory Visit: Payer: Medicare Other | Admitting: Cardiology

## 2010-09-28 NOTE — Assessment & Plan Note (Signed)
She continues to do well.  No current chest pain.  We will continue her medical regimen, with the addition of calcium blocker for hypertension control.

## 2010-09-28 NOTE — Assessment & Plan Note (Addendum)
Control is reasonable, but perhaps not outstanding.  Her functional status is good.  Will get a venous doppler and add amlodipine to her regimen in fairly low dose.  This should provide some better control.

## 2010-09-28 NOTE — Assessment & Plan Note (Signed)
Will get doppler.  Minor in nature.  May get a bit worse with amlodipine.  Patient told.

## 2010-09-28 NOTE — Progress Notes (Signed)
HPI:  Overall [name] looks and feels good.  She has had slight edema in the R leg which has bothered her for years.  No chest pain, and no progressive shortness of breath.  Followed regularly in oncology clinic.  She is ready to go on a trip with her nephew.    Current Outpatient Prescriptions  Medication Sig Dispense Refill  . carvedilol (COREG) 12.5 MG tablet Take 12.5 mg by mouth. Take 2 tablets in the  Morning and 2 tablets in the afternoon       . furosemide (LASIX) 20 MG tablet Take 20 mg by mouth every other day as needed.        . pantoprazole (PROTONIX) 40 MG tablet Take 1 tablet (40 mg total) by mouth daily.  30 tablet  11  . amLODipine (NORVASC) 5 MG tablet Take 1 tablet (5 mg total) by mouth daily.  90 tablet  3  . clopidogrel (PLAVIX) 75 MG tablet Take 75 mg by mouth daily.          Allergies  Allergen Reactions  . Aspirin   . Cephalexin   . Ciprofloxacin     REACTION: weakness  . Iron   . Penicillins   . Statins   . Sulfonamide Derivatives     Past Medical History  Diagnosis Date  . Non-small cell carcinoma of lung     Stage IV  . Hypertension   . Hypercholesterolemia   . CKD (chronic kidney disease)   . Coronary artery disease     s/p PCI RCA  December 2006  . GERD (gastroesophageal reflux disease)   . Chronic anemia     Past Surgical History  Procedure Date  . Hematoma evacuation December 2006    groin  . Appendectomy   . Cataract extraction   . Hemorroidectomy   . Laminectomy     Family History  Problem Relation Age of Onset  . Heart failure Mother   . Lung cancer Sister   . Lung cancer Brother   . Stomach cancer Brother     History   Social History  . Marital Status: Widowed    Spouse Name: N/A    Number of Children: N/A  . Years of Education: N/A   Occupational History  . Not on file.   Social History Main Topics  . Smoking status: Former Smoker    Types: Cigarettes  . Smokeless tobacco: Not on file  . Alcohol Use: No  .  Drug Use:   . Sexually Active:    Other Topics Concern  . Not on file   Social History Narrative  . No narrative on file    ROS: Please see the HPI.  All other systems reviewed and negative.  PHYSICAL EXAM:  BP 150/80  Pulse 56  Resp 18  Ht 5\' 8"  (1.727 m)  Wt 163 lb 12.8 oz (74.299 kg)  BMI 24.91 kg/m2  General: Well developed, well nourished, in no acute distress. Head:  Normocephalic and atraumatic. Neck: no JVD Lungs: Clear to auscultation and percussion. Heart: Normal S1 and S2.  Soft apical murmur, unchanged.   Abdomen:  Normal bowel sounds; soft; non tender; no organomegaly Pulses: Pulses normal in all 4 extremities. Extremities: No clubbing or cyanosis. No definite edema on exam  Neurologic: Alert and oriented x 3.  EKG:  SB with first degree av block.  Nonspecific T abnormality.  Probable LVH.   ASSESSMENT AND PLAN:

## 2010-09-28 NOTE — Assessment & Plan Note (Signed)
Does not take statins despite CAD.Marland Kitchen

## 2010-09-28 NOTE — Assessment & Plan Note (Signed)
Followed in oncology clinic. They are not on epic yet.

## 2010-10-09 ENCOUNTER — Encounter (INDEPENDENT_AMBULATORY_CARE_PROVIDER_SITE_OTHER): Payer: Medicare Other | Admitting: *Deleted

## 2010-10-09 ENCOUNTER — Ambulatory Visit (HOSPITAL_COMMUNITY)
Admission: RE | Admit: 2010-10-09 | Discharge: 2010-10-09 | Disposition: A | Discharge status: Home / Self Care | Payer: Medicare Other | Source: Ambulatory Visit | Attending: Internal Medicine | Admitting: Internal Medicine

## 2010-10-09 DIAGNOSIS — R599 Enlarged lymph nodes, unspecified: Secondary | ICD-10-CM | POA: Insufficient documentation

## 2010-10-09 DIAGNOSIS — J984 Other disorders of lung: Secondary | ICD-10-CM | POA: Insufficient documentation

## 2010-10-09 DIAGNOSIS — I5022 Chronic systolic (congestive) heart failure: Secondary | ICD-10-CM

## 2010-10-09 DIAGNOSIS — C3490 Malignant neoplasm of unspecified part of unspecified bronchus or lung: Secondary | ICD-10-CM | POA: Insufficient documentation

## 2010-10-09 DIAGNOSIS — R609 Edema, unspecified: Secondary | ICD-10-CM

## 2010-10-09 DIAGNOSIS — I2510 Atherosclerotic heart disease of native coronary artery without angina pectoris: Secondary | ICD-10-CM

## 2010-10-10 ENCOUNTER — Other Ambulatory Visit: Payer: Self-pay | Admitting: Internal Medicine

## 2010-10-10 ENCOUNTER — Encounter (HOSPITAL_BASED_OUTPATIENT_CLINIC_OR_DEPARTMENT_OTHER): Payer: Medicare Other | Admitting: Internal Medicine

## 2010-10-10 DIAGNOSIS — C3410 Malignant neoplasm of upper lobe, unspecified bronchus or lung: Secondary | ICD-10-CM

## 2010-10-10 LAB — COMPREHENSIVE METABOLIC PANEL
ALT: 8 U/L (ref 0–35)
AST: 16 U/L (ref 0–37)
Albumin: 3.9 g/dL (ref 3.5–5.2)
Alkaline Phosphatase: 86 U/L (ref 39–117)
Glucose, Bld: 76 mg/dL (ref 70–99)
Potassium: 4.4 mEq/L (ref 3.5–5.3)
Sodium: 141 mEq/L (ref 135–145)
Total Bilirubin: 0.4 mg/dL (ref 0.3–1.2)
Total Protein: 6.4 g/dL (ref 6.0–8.3)

## 2010-10-10 LAB — CBC WITH DIFFERENTIAL/PLATELET
BASO%: 0.5 % (ref 0.0–2.0)
Eosinophils Absolute: 0.8 10*3/uL — ABNORMAL HIGH (ref 0.0–0.5)
MCHC: 34.1 g/dL (ref 31.5–36.0)
MCV: 85.4 fL (ref 79.5–101.0)
MONO%: 8.2 % (ref 0.0–14.0)
NEUT#: 3 10*3/uL (ref 1.5–6.5)
RBC: 4.73 10*6/uL (ref 3.70–5.45)
RDW: 13.2 % (ref 11.2–14.5)
WBC: 5.1 10*3/uL (ref 3.9–10.3)

## 2010-10-11 ENCOUNTER — Telehealth: Payer: Self-pay | Admitting: Cardiology

## 2010-10-11 ENCOUNTER — Other Ambulatory Visit: Payer: Self-pay | Admitting: Internal Medicine

## 2010-10-11 ENCOUNTER — Encounter (HOSPITAL_BASED_OUTPATIENT_CLINIC_OR_DEPARTMENT_OTHER): Payer: Medicare Other | Admitting: Internal Medicine

## 2010-10-11 ENCOUNTER — Encounter: Payer: Self-pay | Admitting: Cardiology

## 2010-10-11 DIAGNOSIS — C3410 Malignant neoplasm of upper lobe, unspecified bronchus or lung: Secondary | ICD-10-CM

## 2010-10-11 DIAGNOSIS — C3490 Malignant neoplasm of unspecified part of unspecified bronchus or lung: Secondary | ICD-10-CM

## 2010-10-11 NOTE — Telephone Encounter (Signed)
Pt returning lauren call re test results.

## 2010-10-11 NOTE — Telephone Encounter (Signed)
Left detailed message on voicemail with LEV results.

## 2010-10-16 NOTE — Assessment & Plan Note (Signed)
[facility]                            CARDIOLOGY OFFICE NOTE   NAME:[name], [name]                     MRN:          [id number]  DATE:05/11/2007                            DOB:          [birth year]    [name] is in for followup.  She saw Dr. Caryn Section recently.  She has had  shingles and has been on Neurontin, and she does not feel very well when  she is taking it.  She has been continuing to drive a school bus.  She  has had some unexplained weight gain recently.   MEDICATIONS:  1. Protonix 40 mg daily.  2. Plavix 75 mg daily.  3. Lasix 20 mg daily.  4. K-Dur 10 mEq daily.  5. Coreg 12.5 b.i.d.  6. Iron daily.  7. Gabapentin 600 b.i.d.   PHYSICAL:  She is awake, alert, and oriented.  Weight is 182, which is up from 170 in August.  Blood pressure 127/70,  pulse 63.  The lung fields are clear.  CARDIAC:  Rhythm is regular.  I do not appreciate a significant murmur  at the present time.   IMPRESSION:  1. Coronary artery disease with prior percutaneous coronary      intervention following myocardial infarction.  2. History of groin hematoma status post vascular repair following      catheterization in [place].  3. History of elevated creatinine.  4. Hypertension.  5. Stage IV non-small-cell cancer treated with chemotherapy.  6. History of anemia.  7. Recent dysuria with urination.   PLAN:  1. Urinalysis.  2. Basic metabolic profile.  3. Return to clinic in 4 months.  4. Because of the unexplained weight gain, we will get a TSH.   ADDENDUM:  EKG reveals sinus rhythm with first degree AV block, inferior  infarct of indeterminate age.     Arturo Morton. Riley Kill, MD, [facility]  Electronically Signed    TDS/MedQ  DD: 05/11/2007  DT: 05/11/2007  Job #: [id number]   cc:   Wilber Bihari. Caryn Section, M.D.

## 2010-10-16 NOTE — Assessment & Plan Note (Signed)
[facility]                            CARDIOLOGY OFFICE NOTE   NAME:[name], [name]                     MRN:          [id number]  DATE:05/19/2008                            DOB:          1933-07-02    Ms. Caputi is in for followup.  She feels great.  She has actually  gotten a boyfriend now, who has been going up to dinner with him.  She  seems quite excited about this.  She has not been having any progressive  symptoms.  She is followed in the Hematology/Oncology Clinic by Dr.  Arbutus Ped.  She is also followed by Dr. Caryn Section for chronic kidney disease.  Since I last saw her, she has not had any chest pain or shortness of  breath.  She does get her blood pressure checked [facility] at [facility] and the last one was 117/67, said she was a bit surprised that it  was elevated today.   CURRENT MEDICATIONS:  1. Protonix 40 mg daily.  2. Coreg 12.5 mg p.o. b.i.d.  3. Aspirin 81 mg daily.   PHYSICAL EXAMINATION:  GENERAL:  She is alert and oriented in no  distress.  VITAL SIGNS:  The weight is 168 pounds, blood pressure is 164/98, and  the pulse is 60.  LUNGS:  The lung fields are clear.  CARDIAC:  Rhythm is regular with a very soft systolic ejection murmur.  EXTREMITIES:  No edema.   Last echocardiogram demonstrated an ejection fraction of 55% with a  localized area at the base of the inferior wall and that is hypokinetic.   IMPRESSION:  1. Coronary artery disease status post treatment with bare-mental      stent to the right coronary artery.  2. Chronic kidney disease, followed by Dr. Caryn Section.  3. History of right groin hematoma requiring surgical correction.  4. Gastroesophageal reflux.  5. Stage IV non-small cell cancer of the lung diagnosed in March 2007,      status post treatment by Hematology/Oncology   PLAN:  1. Return to Dr. Caryn Section.  2. Return to Dr. Arbutus Ped  3. Return to Cardiology in approximately 6 months.  4. We will do lipid and  liver profile today with the understanding      that the patient has had a      history of STATIN intolerance with her primary care doctor in      [place] in the past, and statin therapies were abandoned.     Arturo Morton. Riley Kill, MD, [facility]  Electronically Signed    TDS/MedQ  DD: 05/19/2008  DT: 05/19/2008  Job #: [id number]

## 2010-10-16 NOTE — Assessment & Plan Note (Signed)
[facility]                            CARDIOLOGY OFFICE NOTE   NAME:Roundtree, WILLEAN SCHURMAN                     MRN:          [id number]  DATE:01/06/2007                            DOB:          1934/01/04    Ms. Seaberg is in for followup.  In general, she really is doing quite  well.  She went to see Dr. Caryn Section, although I have not gotten information  from that office visit.  She says she has gained some weight.  She is,  perhaps, slightly short of breath when she exerts herself, but has not  been having any ongoing tightness per se.   CURRENT MEDICATIONS:  1. Protonix 40 mg daily.  2. Plavix 75 mg daily.  3. Lasix 20 mg daily.  4. K-Dur 10 mEq daily.  5. Coreg 12.5 b.i.d.  6. Iron 325 mg 2 tablets daily.   PHYSICAL:  Blood pressure is 140/80, pulse 60.  The lung fields are clear.  CARDIAC:  Rhythm was regular.  I do not appreciate a definite murmur.  There is not really significant lower extremity edema.   ELECTROCARDIOGRAM:  Demonstrates normal sinus rhythm.  There is  nonspecific T wave abnormality and old inferior infarct with first  degree AV block.   IMPRESSION:  1. Coronary artery disease status post percutaneous coronary      intervention.  2. Groin hematoma status post vascular repair following      catheterization in [place].  3. Elevated creatinine.  4. Stage IV non-small-cell cancer with last chemotherapy.  5. Anemia secondary to #4.   PLAN:  1. Continue followup with Dr. Arbutus Ped.  2. Recheck basic metabolic profile.  3. Return to clinic in 2 to 3 months.     Arturo Morton. Riley Kill, MD, [facility]  Electronically Signed    TDS/MedQ  DD: 01/06/2007  DT: 01/06/2007  Job #: [id number]

## 2010-10-16 NOTE — Assessment & Plan Note (Signed)
[facility]                            CARDIOLOGY OFFICE NOTE   NAME:[name], [name]                     MRN:          [id number]  DATE:10/29/2006                            DOB:          1933-11-18    HISTORY OF PRESENT ILLNESS:  [name] is in for a follow up visit.  In  general, she has been stable and not been having any ongoing chest pain.  She has had her follow up CT scan and from a hematologic standpoint, she  has been stable.   MEDICATIONS:  1. Protonix 40 mg daily.  2. Plavix 75 mg daily.  3. Lasix 20 mg daily.  4. K-Dur 10 mEq daily.  5. Coreg 12.5 mg p.o. b.i.d.  6. Iron 325 mg two tablets daily.   PHYSICAL EXAMINATION:  VITAL SIGNS:  Weight 165 pounds, blood pressure  110/70, pulse 72.  LUNGS:  Fields are clear.  CARDIAC:  Unchanged and reveals a regular rate and rhythm.  There is a  soft apical murmur.  EXTREMITIES:  Trace edema.   IMPRESSION:  1. Coronary artery disease.  2. History of modest renal insufficiency.  3. Hypertension, better controlled.  4. Stage IV non-small cell cancer with last chemotherapy August 2007.  5. Anemia secondary to #4.   PLAN:  1. Continue current medical regimen.  2. Basic metabolic panel.  3. Return to clinic in three months.     Arturo Morton. Riley Kill, MD, [facility]  Electronically Signed    TDS/MedQ  DD: 10/29/2006  DT: 10/29/2006  Job #: [id number]

## 2010-10-16 NOTE — Assessment & Plan Note (Signed)
[facility]                            CARDIOLOGY OFFICE NOTE   NAME:[name], [name]                     MRN:          [id number]  DATE:09/14/2007                            DOB:          1933-11-23    [name] is in for followup.  She is really doing quite well.  She has  seen Dr. Caryn Section recently and also has seen Dr. Arbutus Ped.  She is continuing  to follow up with Dr. Arbutus Ped.  She has a little bit of shortness of  breath with exertion, but that is about it.  Her weight is down from the  last visit from 182 to 173.  She denies any chest pain.   CURRENT MEDICATIONS:  1. Protonix 40 mg daily.  2. Plavix 75 mg daily.  3. Lasix 20 mg daily.  4. K-Dur 10 mEq daily.  5. Coreg 12.5 mg b.i.d.  6. Iron 2 tablets daily.   PHYSICAL EXAMINATION:  VITAL SIGNS:  Blood pressure 124/80, pulse 62.  LUNGS:  The lung fields are entirely clear.  CARDIAC:  Rhythm is regular with a minimal systolic ejection murmur.  EXTREMITIES:  There is no extremity edema noted.   Recent electrocardiogram demonstrates normal sinus rhythm with first-  degree AV block, delay in R wave progression, possible inferior MI of  indeterminate age.  Of note, compared to previous tracings, there is no  significant interval change.   CONCLUSIONS:  1. Coronary artery disease with prior stenting of the right coronary      artery.  2. History of lung cancer.  3. History of elevated creatinine.  4. Hypertension.  5. History of anemia.   PLAN:  1. Return to clinic in 4-6 months.  2. Continue current medical regimen.  3. Obtain laboratory studies from the Hem/Onc Clinic.     Arturo Morton. Riley Kill, MD, [facility]  Electronically Signed    TDS/MedQ  DD: 09/14/2007  DT: 09/14/2007  Job #: [id number]

## 2010-10-16 NOTE — Letter (Signed)
April 07, 2008    Claretta Fraise, MD  [facility]  [address] [place], [place]  [address]   RE:  [name], [name]  MRN:  [id number]  /  DOB:  Mar 18, 1934   Dear Dr. Lewis Moccasin:   I had the distinct pleasure of seeing Ms. Benassi in the office today in  followup.  She is always such a ray of sunshine.  She has a tremendous  attitude despite her oncologic issues.  Since I last saw her, she is  doing well.  Her main problem has been some recurrent left toe gout.  She takes Indocin on a periodic basis.  She has not been short of  breath, she has had a sinus infection, and she is currently on  doxycycline.  She is not scheduled to see Oncology or Nephrology until  February.   PHYSICAL EXAMINATION:  VITAL SIGNS:  Today, the blood pressure was  160/90 and the pulse is 58.  LUNGS:  Fields were clear.  HEART:  There is a soft S4 gallop.   The electrocardiogram demonstrates normal sinus rhythm with first-degree  AV block and nonspecific T-wave abnormality.   She has continued to remain in good stead, since I last saw her.  As her  overall LV function is relatively good, I did elect to hold on her Lasix  and potassium at the present time, specifically she is going to watch  her fluid status carefully over the next few weeks.  This may help to  resolve the recurrent gout without pharmacologic therapy.  In addition,  she has been on continuous iron, and it is not quite clear to me that  she needs this.  Finally, she would like to come off of Plavix, and I  told her that I thought it would be reasonable to take 1 baby aspirin a  day.  She has a non-drug-eluting stent, and stable coronary disease.   As previously noted, she does have intolerance to STATINS.   We will plan to get a CBC and a BMET today.  I plan to see her back in  followup in about 6 weeks to reassess, and she and I have talked  carefully about all of her monitoring for fluid overload during this  period.  I appreciate the opportunity of sharing in her care.    Sincerely,      Arturo Morton. Riley Kill, MD, [facility]  Electronically Signed    TDS/MedQ  DD: 04/07/2008  DT: 04/07/2008  Job #: [id number]   CC:   Lajuana Matte, MD  Wilber Bihari. Caryn Section, M.D.

## 2010-10-19 NOTE — Discharge Summary (Signed)
NAMECARLIN, MAMONE NO.:  [id number]   MEDICAL RECORD NO.:  [id number]          PATIENT TYPE:  INP   LOCATION:  2014                         FACILITY:  [facility]   PHYSICIAN:  Arturo Morton. Riley Kill, M.D. [facility] OF BIRTH:  [birth year]   DATE OF ADMISSION:  05/29/2005  DATE OF DISCHARGE:  06/06/2005                                 DISCHARGE SUMMARY   Time spent at discharge PA/MD greater than 45 minutes.   PRIMARY DIAGNOSIS:  1.  Non ST segment elevation inferior MI with a peak CK-MB of 472/95.9 and a      peak troponin-I of 11.51, status post cardiac catheterization at      [facility] and transferred to [facility] where she had      percutaneous intervention and a bare metal stent to her RCA on 06/04/05.  2.  Right groin hematoma from first catheterization, status post surgical      repair by Dr. Durwin Nora.  3.  Dyslipidemia.  4.  Mild renal insufficiency with peak creatinine of 1.8, BUN 17, creatinine      1.5 at discharge.  5.  Dyslipidemia reported.  6.  Blood loss anemia with a hemoglobin of 8.5 and hematocrit of 25.0,      status post transfusion with hemoglobin of 9.5 and hematocrit of 27.2 at      discharge, empiric iron supplementation started as well.  7.  History of dyslipidemia with a low HDL and high LDL.  8.  History of glucose intolerance with a hemoglobin A1C of 5.5 this      admission.  9.  Recent upper respiratory tract infection.  10. History of aspirin.  11. Hypertension.  12. Reflux esophagitis.  13. Status post chest x-ray showing nodular opacities overlying the right      upper and right mid lung, chest CT without contrast results pending at      the time of dictation.  14. Allergy or intolerance to KEFLEX, ASPIRIN, CIPRO, LIPITOR, ZOCOR,      CRESTOR, AMOXICILLIN, SULFA and TAPE.  15. Primary cardiologist - she is new and will follow up in [place], [place] after being re-assessed by Dr. Riley Kill.  16. Primary care physician  - Dr. Sheela Stack in [place].  17. Remote history of tobacco abuse.  18. Status post hysterectomy, appendectomy, hemorrhoid surgery and      laminectomy.   PROCEDURES:  1.  Cardiac catheterization.  2.  Coronary arteriogram.  3.  PTCA and bare metal stent to the RCA.  4.  Exploration of right groin with evacuation of hematoma and repair of      right common femoral artery.  5.  Lower extremity Dopplers.  6.  CT of the chest.   HOSPITAL COURSE:  Ms. Riemenschneider is a 77 year old female with no prior history  of coronary artery disease.  She was evaluated by Dr. Jake Samples and referred to  Dr. Campbell Lerner for evaluation of chest pain.  She had a cardiac catheterization  up in [place] and was transferred to [facility] for  percutaneous intervention of  high grade RCA lesion.   Her enzymes were borderline at [facility].  After she came here her groin  was initially stable, but she developed a hematoma on December 28.  She was  evaluated by Dr. Riley Kill and Dr. Durwin Nora was called.  She was taken to the OR  that night.  She had evacuation of hematoma and repair of a right common  femoral artery.  A JP drain was left in place.   Her cardiac enzymes were cycled and were elevated indicating a non ST  segment elevation MI.  She was managed medically until she recovered from  the surgery well enough for staged intervention to her RCA.  She was anemic  after the groin bleed and the surgery and received a total of 4 units of  packed red cells.  Her iron level was low at 19 with a TIBC of 189, 10%  saturation and UIBC of 170.  She was started on iron supplementation as  well.  Her BUN and creatinine were followed closely and she was hydrated.  Her BUN and creatinine remained slightly elevated, but stable post  procedure.   On 06/04/05, she had percutaneous intervention to the RCA with a bare metal  stent.  On 06/05/05, which was postoperative day 6, her JP drain was  removed.  Her incision  was healing well.  A chest x-ray was ordered and  showed some nodules.  A non contrast CT was recommended.   On 06/06/05, Ms. Henandez was evaluated by Dr. Riley Kill and considered stable.  The chest CT results are pending at the time of dictation, but if this does  not show any acute problem, she is tentatively considered stable for  discharge with outpatient follow up arranged.   DISCHARGE INSTRUCTIONS:  1.  Activity levels to include no driving for one week and no lifting for 3      weeks.  She is to call our office for any problems with the cath site or      incision.  She is to increase her activity slowly and was seen by      cardiac rehab and given guidelines.  She is to stick to a low fat diet.      She is to follow up with Dr. Riley Kill on 01/25 at 9:30 and is to recheck      a CBC and BMET prior to that office visit.  She is to follow up with Dr.      Mayford Knife and Dr. Jake Samples and Dr. Campbell Lerner as needed.   DISCHARGE MEDICATIONS:  1.  Plavix 75 mg a day.  2.  Metoprolol 25 mg b.i.d.  3.  Protonix 40 mg a day.  4.  Nu Iron 150 mg b.i.d.  5.  Atacand 32 mg a day.  6.  Sublingual nitroglycerin p.r.n.  7.  Indomethacin 50 mg p.r.n. is on hold.  She is not to take aspirin.      Theodore Demark, P.A. [facility]      Thomas D. Riley Kill, M.D. [facility]  Electronically Signed    RB/MEDQ  D:  06/06/2005  T:  06/06/2005  Job:  [id number]   cc:   Arturo Morton. Riley Kill, M.D. [facility]  [address]  [address]  [place]  [place] [address]   Sheela Stack, M.D.

## 2010-10-19 NOTE — Discharge Summary (Signed)
[name], [name] NO.:  [id number]   MEDICAL RECORD NO.:  [id number]          PATIENT TYPE:  INP   LOCATION:  3743                         FACILITY:  [facility]   PHYSICIAN:  Arturo Morton. Riley Kill, M.D. [facility] OF BIRTH:  [birth year]   DATE OF ADMISSION:  08/16/2005  DATE OF DISCHARGE:  08/21/2005                                 DISCHARGE SUMMARY   PROCEDURES:  Chest CT without contrast.   DISCHARGE DIAGNOSIS:  Fatigue, status post change from Toprol to Coreg this  admission.   SECONDARY DIAGNOSES:  1.  Renal insufficiency with a BUN of 43 and a creatinine of 3.4 at      discharge.  2.  Ischemic cardiomyopathy with an ejection fraction of 30%.  3.  Status post percutaneous coronary intervention to the right coronary      artery in December 2006.  4.  Hypertension.  5.  Dyslipidemia.  6.  Recent upper respiratory infection.  7.  Gastroesophageal reflux disease.  8.  Right groin hematoma post catheterization requiring vascular repair with      periprocedural myocardial infarction.  9.  Blood loss anemia.  10. Dyspnea.  11. Right groin wound infection postoperatively requiring hospitalization.  12. Status post appendectomy, cataract surgery, hysterectomy,      hemorrhoidectomy and laminectomy.   Allergy or intolerance to KEFLEX, ASPIRIN, CIPRO and AMOXICILLIN as well as  STATINS.   TIME AT DISCHARGE:  37 minutes.   HOSPITAL COURSE:  [name] is a 77 year old female with known coronary  artery disease.  She developed shortness of breath and on echo she had an EF  of 30%, which was lower than previously documented.  Her BNP was greater  than 1100.  She was admitted for further evaluation.   Taking [name] off the Toprol and starting her on Coreg greatly improved  her general well-being.  It also decreased her fatigue.  She was up-titrated  and her blood pressure was under good control at discharge.  A chest x-ray  showed a pulmonary nodule and a chest CT  without contrast was performed.   The chest CT without contrast showed a 1 cm left upper lobe nodule that had  previously been 8.5 mm and now has spiculated margins.  An 8.5 mm left upper  lobe nodule previously measured 5.3 mm and was also worrisome for neoplasm  versus metastasis.  Some other lymph nodes were mildly enlarged.  Sampling  or PET/CT scan was recommended.  She had a mild thoracic aneurysm at 4 cm  and a small right pleural effusion.  A pulmonary consult was called.   [name] was seen by Dr. Delton Coombes, who reviewed the data.  He discussed the  options with [name] and the decision was made to get a tissue diagnosis.  A CT-guided needle biopsy was considered optimal and is to be performed as  an outpatient.   [name] has a history of renal insufficiency, and her creatinine was more  elevated than usual although stable.  Her BUN was 43 with a creatinine of  3.4 at discharge.  She is  to follow up with Dr. Caryn Section.  Her systolic blood  pressure was in the 130s-160s on Lasix 20 mg a day and Coreg 12.5 mg b.i.d.   She had been on anticoagulation because of the stent to the RCA, but she is  to hold aspirin and Plavix until after the lung biopsy.   On August 21, 2005, [name] was evaluated by Dr. Riley Kill and considered  stable for discharge with outpatient follow-up arranged.   DISCHARGE INSTRUCTIONS:  1.  She is to stick to a diet that is low in fat and salt.  2.  She is to weigh herself daily and report five-pound gains weekly or two      pounds in one day.  3.  She is to arrive Monday, March 26, to short stay at 7:30 without eating      or drinking after midnight.  She is not to drive herself.  She is to      hold aspirin and Plavix until after the biopsy.  She is to get a BMET on      Monday as well.   DISCHARGE MEDICATIONS:  1.  Lasix 20 mg a day.  2.  Coreg 12.5 mg b.i.d.  3.  Protonix 40 mg a day.  4.  K-Dur 10 mEq a day.  5.  Xanax 0.25 mg b.i.d.  p.r.n.      Theodore Demark, P.A. [facility]      Thomas D. Riley Kill, M.D. [facility]  Electronically Signed    RB/MEDQ  D:  08/22/2005  T:  08/23/2005  Job:  [id number]   cc:   Wilber Bihari. Caryn Section, M.D.  Fax: [phone]   Ladell Pier, M.D.  Fax: [phone]   Leslye Peer, M.D.

## 2010-10-19 NOTE — Assessment & Plan Note (Signed)
[facility]                            CARDIOLOGY OFFICE NOTE   NAME:Brittingham, ACIRE TANG                     MRN:          [id number]  DATE:07/23/2006                            DOB:          07/03/1933    Ms. Scheier is in for a followup. In general, she is doing quite well.  Her followup echocardiogram suggests an ejection fraction of 55% with  overall systolic function was normal. There was mild hypokinesis noted  in the base of the inferior wall, but this has improved from the  previous study. In addition, her cancer appears to be stable. After her  echocardiogram she developed an upper respiratory infection, and has  been using Mucinex, although she said it made her feel worse.   Today, the blood pressure is 162/78, which is higher than general. Pulse  is 80.  Lung fields are clear.  Cardiac rhythm is regular without a significant murmur noted.   We are going to go ahead and get a basic metabolic profile at the  present time. She currently will remain on her current medications. We  had suggested that she could use some saline drops for her nose. She is  to return to clinic in about three months.     Arturo Morton. Riley Kill, MD, [facility]  Electronically Signed    TDS/MedQ  DD: 07/23/2006  DT: 07/23/2006  Job #: [id number]

## 2010-10-19 NOTE — Assessment & Plan Note (Signed)
[facility]                           PRIMARY CARE OFFICE NOTE   NAME:[name], [name]                     MRN:          [id number]  DATE:07/08/2006                            DOB:          [birth year]    CHIEF COMPLAINT:  New patient to the practice.   HISTORY OF PRESENT ILLNESS:  The patient is a 77 year old white female  here to establish with primary care. She was formerly followed by Dr.  Sharen Hint, who is deceased. She has a somewhat complex medical  history. She was diagnosed with small cell lung cancer after abnormal  chest xray. She underwent chemotherapy and currently in remission.  She  has had followup CT scans of her chest and a neg MRI of the brain.   She also has a history of coronary artery disease with prior myocardial  infarction. She is followed by Dr. Riley Kill at [facility]. She is  reported to have depressed EF of 30% to 40%, but has been asymptomatic  and quite active. She is also noted to have some issues with renal  dysfunction. This may have occurred during her initial cardiac cath from  either cholesterol emboli versus dye toxicity. Review of her chart notes  recent creatinine of 1.8.   She is accompanied by her daughter today who relays that her mother is  very positive despite her cancer.  She still is employed as a Chartered loss adjuster and enjoys  bowling.   PAST MEDICAL HISTORY:  1. Small cell lung cancer, status post chemotherapy.  2. Hypertension, controlled.  3. Gastroesophageal reflux disease.  4. History of intermittent gout.  5. Status post appendectomy in 1982.  6. Tonsillectomy in 1971.  7. Total hysterectomy in 1982.  8. Laminectomy in 1981 for ruptured disc.  9. Hyperlipidemia with INTOLERANCE TO STATINS.   CURRENT MEDICATIONS:  1. Plavix 75 mg once a day.  2. Protonix 40 mg once a day.  3. Coreg 12.5 mg twice a day.  4. Lasix 20 mg once a day.  5. Potassium chloride 10 mEq once a day.  6. Colchicine 0.6 mg as needed.   SOCIAL HISTORY:  The patient is widowed. Currently living alone.  Remote  history of tobacco. Quit approximately 13 years ago. No report of any  alcohol use.   FAMILY HISTORY:  Both parents deceased in their mid-8s. The patient has  several brothers noted to have lung cancer, unknown type.   REVIEW OF SYSTEMS:  No fevers or chills. No HEENT symptoms. The patient  denies any chest pain, shortness of breath. Heartburn is controlled on  Protonix. No dysuria, frequency, urgency. All other systems negative.   PHYSICAL EXAMINATION:  VITAL SIGNS: Height is 5 feet, 8-1/2 inches.  Weight is 160.5 pounds. Temperature is 96.9, pulse is 76 and blood  pressure is 146/72 in the left arm in the seated position.  In general, the patient is a very pleasant, well-developed, well-  nourished, 77 year old white female in no apparent distress.  HEENT: Normocephalic, atraumatic. Pupils are equal and reactive to light  bilaterally. Extra-ocular muscles were intact. The patient  was  anicteric. Conjunctivae was within normal limits. External auditory  canals and tympanic membranes are clear bilaterally. Hearing was grossly  normal. She did have a cataract in her right eye and is status post  iridectomy left eye. Oropharyngeal examination was unremarkable.  NECK: Was supple. No adenopathy, carotid bruits or thyromegaly.  CHEST: Normal respiratory effort. Clear to auscultation bilaterally. No  rhonchi, rales or wheezing.  CARDIOVASCULAR: Regular rate and rhythm. No significant murmurs, rubs or  gallops appreciated.  ABDOMEN: Soft and nontender. Positive bowel sounds. No organomegaly.  MUSCULOSKELETAL: The patient had slightly diminished temperature and  vibration sense to her lower extremities. Good pedis dorsalis pulses. No  edema.  NEUROLOGIC: Cranial nerves II-XII was grossly intact. She was nonfocal.   IMPRESSIONS/RECOMMENDATIONS:  1. History of small cell lung cancer,  status post chemo.  2. Coronary artery disease and history of myocardial infarction.      Status post percutaneous coronary intervention right coronary      artery December 2006.  3. Gastroesophageal reflux disease.  4. History of intermittent gout.  5. Chronic renal insufficiency secondary to cholesterol emboli versus      dye toxicity.  6. Health maintenance.   RECOMMENDATIONS:  I refilled her colchicine today to be taken on a  p.r.n. basis. She only gets one or two flares per year. From a cardiac  standpoint, she seems stable and is asymptomatic. She is to continue to  followup with Dr. Riley Kill. It is unfortunate that she cannot tolerate  statins. She was to try over-the-counter fish oil to be taken once or  twice a day.   Reviewed avoiding further injury to her kidneys.  She is to avoid  dehydration and toxic agents such as NSAIDs. Tylenol for p.r.n.  headaches or pain.   She is scheduled for q2 month CT scans with Dr. Arbutus Ped. We will try to  obtain previous records and recent laboratory studies.   Followup time is in approximately 2 to 3 months.     Barbette Hair. Artist Pais, DO  Electronically Signed    RDY/MedQ  DD: 07/08/2006  DT: 07/08/2006  Job #: [id number]

## 2010-10-19 NOTE — Op Note (Signed)
[name], [name]              ACCOUNT NO.:  [id number]   MEDICAL RECORD NO.:  [id number]          PATIENT TYPE:  INP   LOCATION:  2550                         FACILITY:  [facility]   PHYSICIAN:  Di Kindle. Edilia Bo, M.D.DATE OF BIRTH:  13-Jan-1934   DATE OF PROCEDURE:  05/30/2005  DATE OF DISCHARGE:                                 OPERATIVE REPORT   PREOPERATIVE DIAGNOSIS:  Hematoma of the right groin with hypotension.   POSTOPERATIVE DIAGNOSIS:  Hematoma of the right groin with hypotension.   PROCEDURE:  Exploration of right groin with evacuation of hematoma and  repair of right common femoral artery.   SURGEON:  Di Kindle. Edilia Bo, M.D..   ASSISTANT:  Pecola Leisure, PA   ANESTHESIA:  General.   ESTIMATED BLOOD LOSS:  750.   INDICATIONS:  This is a 77 year old woman who had undergone cardiac  catheterization via right femoral approach at an outlying institution. She  had been transferred here for possible coronary intervention. Today, she had  some slight amount of oozing from the right groin but tonight developed  sudden swelling in the right groin with a large hematoma and was  hypotensive. The patient was taken urgently to the operating room for  exploration and control of the bleeding.   TECHNIQUE:  The patient was taken to the operating room and an arterial line  was placed by anesthesia as was a triple-lumen catheter. The right groin and  lower abdomen were prepped and draped in usual sterile fashion. A  longitudinal incision was made and a large amount hematoma was evacuated.  Bleeding was controlled digitally and once I was able to dissect out the  artery proximal and distal to the area of active bleeding, I was able to  clamp proximally and clearly identify the injury in the lateral aspect of  the right common femoral artery. This was controlled with a 6-0 Prolene  suture. Clamp was released. There was good pulse distal and there was no  further bleeding.  The wound was irrigated with copious amounts saline and  all bleeding controlled with electrocautery. Two 19 Blake drains were  placed. The wound was closed with deep layer of 2-0 Vicryl, subcutaneous  layer with 2-0 Vicryl and the skin closed with a 3-0 Vicryl. Sterile  dressing was applied. The patient tolerated the procedure well was  transferred to the recovery room in satisfactory condition. All needle and  sponge counts were correct.      Di Kindle. Edilia Bo, M.D.  Electronically Signed     CSD/MEDQ  D:  05/30/2005  T:  05/31/2005  Job:  [id number]

## 2010-10-19 NOTE — Cardiovascular Report (Signed)
NAMEPASCALE, MAVES NO.:  [id number]   MEDICAL RECORD NO.:  [id number]          PATIENT TYPE:  INP   LOCATION:  2904                         FACILITY:  [facility]   PHYSICIAN:  Arturo Morton. Riley Kill, M.D. [facility] OF BIRTH:  1934/06/02   DATE OF PROCEDURE:  06/04/2005  DATE OF DISCHARGE:                              CARDIAC CATHETERIZATION   INDICATIONS:  Ms. Schulte is a 77 year old woman who presents with prolonged  chest pain. She underwent catheterization at [facility] which demonstrated  a high-grade RCA stenosis. She was transferred but because of renal  insufficiency her PCI was deferred. She was placed on intravenous heparin.  She then developed a significant groin bleed resulting in a drop in her  hematocrit to 20 and need for emergent operative closure of the whole. After  this, she then stabilized but developed some chest pressure associated with  elevated enzymes, but no definite ST elevation. She has been monitored over  the weekend and done well. She was brought back to the laboratory today for  percutaneous intervention after being released by the vascular surgeons for  anticoagulation therapy.   She continues to have a JP drain in the right femoral artery and continues  to drain slightly.   PROCEDURE:  Percutaneous stenting of the right coronary artery.   DESCRIPTION OF PROCEDURE:  The patient was brought to the catheterization  laboratory after informed consent prepped and draped in the usual fashion.  Through an anterior puncture, the left femoral artery was easily entered.  Following this, a 6-French sheath was placed. We used a left coronary bypass  catheter. Bivalirudin was given according to protocol and ACT found  appropriate for percutaneous intervention. We initially used a Research scientist (physical sciences)  which had difficulty crossing, but with the application of a balloon which  was a 2.25 x 12 Maverick, we were able to push across the lesion and in the  distal vessel. Balloon dilatation was then performed which demonstrated the  area of significant disease. There was high-grade disease over about a 20 mm  area and then slightly less disease extending beyond this up to an RV  branch. Because of this, we elected to place a 32 x 2.75 Liberte stent.  Importantly, the patient is not a candidate for aspirin, and because of this  she had been on Plavix only and we felt that a nondrug-eluting stent  platform was probably optimal given the circumstances. The stent was then  taken up to about 12-13 atmospheres. We then postdilated using a 3.25  Quantum Maverick balloon and this was taken up to 14-16 atmospheres  throughout the course of the stent. There remained one area of dumbbell  right at the original lesion site, and this was at the site of most severe  disease. An [place] 3 mm 9 mm length balloon was then placed at this site which  was well within the stent. This was taken up to about 15-16 atmospheres with  a marked improvement in the appearance of this area. All catheters were  subsequently removed and final angiographic views obtained. There was an  excellent final  angiographic result.   ANGIOGRAPHIC DATA:  The right coronary artery is more or less totally  occluded with TIMI 0.5 flow through the vessel at the site of the lesion.  The area of disease extends over about 30 mm length and the area that was  stented was 32 mm in length. Distal to the stent site, there is segmental  plaquing of about 30-40% over a moderate area. The PDA was a large-caliber  vessel that goes out to the apical tip and the posterolateral branches are  relatively small in caliber. There are multiple vessels; however, it all had  some fair amount of luminal irregularities.   There was TIMI zero to 0.5 flow at beginning of procedure and TIMI III flow  at the completion of procedure. There was no evidence of edge tear or  compromise. The final angiographic result was  excellent.   CONCLUSION:  Successful percutaneous stenting of the right coronary artery  using a nondrug-eluting platform due to inability to take aspirin.   DISPOSITION:  The patient will be treated medically. We may arrange for  follow-up in [place].      Arturo Morton. Riley Kill, M.D. [facility]  Electronically Signed     TDS/MEDQ  D:  06/04/2005  T:  06/04/2005  Job:  [id number]   cc:   Duke Salvia, M.D.  [address]  [address]  [place]  [place] [address]   CV Laboratory   Patient's medical record

## 2010-10-19 NOTE — Assessment & Plan Note (Signed)
[facility]                                 ON-CALL NOTE   NAME:[name], [name]                       MRN:          [id number]  DATE:08/30/2006                            DOB:          19-May-1934    DATE OF TELEPHONE CALL:  August 30, 2006, at 8:10 p.m.   DATE OF BIRTH:  24-Dec-1933.   PHONE NUMBER:  [phone].   REGULAR DOCTOR:  Dr. Artist Pais; I am Dr. Milinda Antis on call.   The beeper message simply said hold for office, please cancel  appointment on April 1st at 10:30 a.m.  I tried to call the number and  could not get an answer so I decided to just dictate this to go to Dr.  Olegario Messier office so they would be aware of the cancellation.     [name], MD  Electronically Signed    MAT/MedQ  DD: 08/30/2006  DT: 08/30/2006  Job #: [id number]   cc:   Barbette Hair. Artist Pais, DO

## 2010-10-19 NOTE — H&P (Signed)
[name], [name]              ACCOUNT NO.:  [id number]   MEDICAL RECORD NO.:  [id number]          PATIENT TYPE:  INP   LOCATION:  5020                         FACILITY:  [facility]   PHYSICIAN:  Di Kindle. Edilia Bo, M.D.DATE OF BIRTH:  09/25/1933   DATE OF ADMISSION:  06/11/2005  DATE OF DISCHARGE:                                HISTORY & PHYSICAL   CARDIOLOGIST:  Dr. Riley Kill   CHIEF COMPLAINT:  Right groin wound infection.   HISTORY OF PRESENT ILLNESS:  This is a 77 year old Caucasian female who  underwent a cardiac catheterization on May 29, 2005, in [place].  She was subsequently transferred to [facility] to undergo right coronary  artery percutaneous intervention. Subsequent to transfer the patient  developed a right groin hematoma and was then taken urgently to the OR for  exploration and evacuation of hematoma with repair of the right femoral  artery. She then underwent percutaneous intervention to the right coronary  artery. Her groin wound was stable and the patient was later discharged home  without difficulty. She presents to the CVTS office today with complaints of  drainage and odor from the right groin wound. She first noticed an increase  in the symptoms this past Saturday and she continued to keep the wound clean  and dry. However, she noticed a progression in the drainage and some  separation of the skin. She denies pain and discomfort, fevers, chills, and  chest pain.   PAST MEDICAL HISTORY:  1.  Hypertension.  2.  Dyslipidemia.  3.  Mild renal insufficiency with creatinine of 2 on prior admission.  4.  Recent upper respiratory infection.  5.  Gastroesophageal reflux.  6.  Status post appendectomy.  7.  Status post cataract surgery.  8.  Status post hysterectomy.  9.  Status post hemorrhoidectomy.  10. Status post laminectomy.  11. Status post percutaneous intervention to the right coronary artery.  12. Right groin hematoma status post cardiac  catheterization, status post      exploration and evacuation of hematoma with repair of right femoral      artery.   ALLERGIES:  1.  KEFLEX.  2.  ASPIRIN.  3.  CIPRO.  4.  AMOXICILLIN.  5.  STATIN.  6.  ZETIA.  7.  SULFA.   MEDICATIONS:  1.  Plavix 75 mg daily.  2.  Metoprolol 25 mg b.i.d.  3.  Protonix 40 mg daily.  4.  Nu-Iron 150 mg b.i.d.  5.  Atacand 32 mg daily.  6.  Sublingual nitroglycerin p.r.n.   REVIEW OF SYSTEMS:  Please see HPI for significant positives and negatives.   SOCIAL HISTORY:  This is a widowed female with two children. She quit  smoking tobacco and does not drink. She does have assistance available to  her.   PHYSICAL EXAMINATION:  VITAL SIGNS:  Blood pressure 156/84, heart rate 84,  respirations 18, temperature 99.6.  GENERAL:  This is a 77 year old Caucasian female in no acute distress.  HEENT:  Normocephalic, atraumatic. Pupils equal, round, and reactive to  light and accommodation.  RESPIRATIONS:  Symmetric, unlabored, and clear.  CARDIAC:  Regular rate and rhythm.  ABDOMEN:  Soft, nontender, nondistended, with normoactive bowel sounds and  is obese.  GENITOURINARY AND RECTAL:  Deferred.  EXTREMITIES:  There is diffuse edema present in the bilateral lower  extremities that is nonpitting. The right groin does have purulent  drainage  present. Skin edges are separated superficially. The wound does not extend  down into the deeper tissues. The surrounding area is erythematous and  indurated. There is also increased heat noted. The temperature of the  bilateral lower extremities is warm.  NEUROLOGIC:  Nonfocal. Gait is steady.   ASSESSMENT:  Right groin wound infection.   PLAN:  1.  Admit to [facility].  2.  Start the patient on IV vancomycin.  3.  Wound debridement in the OR, possibly Thursday, by Dr. Edilia Bo.  4.  Local wound care.      Pecola Leisure, PA      Di Kindle. Edilia Bo, M.D.  Electronically Signed    AY/MEDQ   D:  06/11/2005  T:  06/11/2005  Job:  [id number]

## 2010-10-19 NOTE — Consult Note (Signed)
NAMETATIJANA, BIERLY NO.:  [id number]   MEDICAL RECORD NO.:  [id number]          PATIENT TYPE:  INP   LOCATION:  2014                         FACILITY:  [facility]   PHYSICIAN:  Shan Levans, M.D. LHCDATE OF BIRTH:  20-Apr-1934   DATE OF CONSULTATION:  06/06/2005  DATE OF DISCHARGE:                                   CONSULTATION   [name] is referred for evaluation of bilateral pulmonary nodules.  She  was admitted initially on May 29, 2005, for evaluation of coronary  artery disease.  She has longstanding hypertension, remote smoking history,  dyslipidemia, had substernal chest pain before Christmas radiating to the  arm.  She was transferred to [facility] for further evaluation after a  catheterization in [place] showed inferior ischemia and a high-grade  RCA lesion.  She has a history of renal insufficiency, dyslipidemia,  hypertension, upper respiratory tract infection and reflux esophagitis.  This patient was referred to pulmonary on the basis of bilateral pulmonary  nodules seen on CT scan of the chest.  The patient is essentially  asymptomatic pulmonary-wise.  No exposure history.  She is an ex-smoker.   PAST MEDICAL HISTORY:  Medical as noted above.   PAST SURGICAL HISTORY:  Appendectomy, cataract surgery, hysterectomy,  hemorrhoidectomy, laminectomy.   REVIEW OF SYSTEMS:  Noncontributory.   SOCIAL HISTORY:  Lives in [place].  Has a daughter actively involved in  her care.  Does not smoke or drink currently.   ALLERGIES:  KEFLEX, ASPIRIN, CIPRO, AMOXICILLIN, STATIN, ZETIA, SULFA.   CURRENT MEDICATIONS:  Plavix, Lopressor, Protonix, Avapro.   PHYSICAL EXAMINATION:  GENERAL:  This is a middle-aged white female in no  distress.  VITAL SIGNS:  Temperature 97, blood pressure 113/69, pulse 78, respirations  18, saturation 97%.  CHEST:  Entirely clear to auscultation and percussion.  CARDIAC:  A regular rate and rhythm without S3, normal S1,  S2.  ABDOMEN:  Soft, nontender.  EXTREMITIES:  No edema or clubbing or venous disease.  SKIN:  Clear.  NEUROLOGIC:  Intact.  HEENT:  No jugular venous distention, lymphadenopathy.  Oropharynx clear.  NECK:  Supple.   LABORATORY DATA:  CT scan of the chest was obtained and reviewed and shows  bilateral pulmonary nodules, which are noncalcified, left greater than  right.  These are all less than 1 cm in diameter.  There is no  lymphadenopathy seen.   IMPRESSION:  Noncalcified pulmonary nodules, likely granulomas until proven  otherwise.  These need to be followed expectantly. We will obtain another CT  scan of the chest on office follow-up in three months' time.  No biopsy at  this time is recommended.  The patient has already pursued smoking  cessation.      Shan Levans, M.D. [facility]  Electronically Signed     PW/MEDQ  D:  06/06/2005  T:  06/07/2005  Job:  [id number]   cc:   Thomas C. Wall, M.D.  [address]  [address]  [place]  [place] [address]   Arturo Morton. Riley Kill, M.D. [facility]  [address]  [address]  [address]  [place] [address]

## 2010-10-19 NOTE — Assessment & Plan Note (Signed)
[facility]                              CARDIOLOGY OFFICE NOTE   NAME:[name], [name]                     MRN:          [id number]  DATE:02/20/2006                            DOB:          January 08, 1934    [name] is in for a followup visit.  In general, she is doing pretty  well.  She stopped her Lasix for the past 5 days. When she has had her blood  pressure checked either in Dr. Asa Lente office or at the [facility] where she  goes to get it checked it has been in the 120-130 range.  She stopped her  Lasix because she did not think that it was helping her urination.  She has  had recent laboratory studies with a BUN of 18 and a creatinine of 1.8 over  at the [facility].  She also has had a little bit of gout and Dr. Arbutus Ped  has given her some colchicine.  She would like some Indocin.   On physical today the blood pressure is 160/92, the pulse is 87.  The lung fields are clear.  The cardiac rhythm is regular.  The extremities reveal trace edema.   [name] is currently under treatment for stage IV non-small cell  carcinoma of the lung. Her current situation is stable.  She has gotten  chemotherapy and they are going to give her a break at the present time.  Her most recent laboratory studies have been stable.  Her current EKG does  not show ischemic changes and there is no definite evidence of inferior  infarction.  She does have mild swelling in the lower extremities and I have  asked her to resume her Lasix at the present time.  We will try to get a  hold of her laboratory studies.  I will her back up in followup in about 1  month.  At that time we will recheck her basic metabolic profile.  I have  asked her to resume her Lasix at the present time.  We may need to add a  beta blocking agent to her regimen.                              Arturo Morton. Riley Kill, MD, [facility]    TDS/MedQ  DD:  02/20/2006  DT:  02/21/2006  Job #:  [id number]

## 2010-10-19 NOTE — Consult Note (Signed)
[name], [name]              ACCOUNT NO.:  [id number]   MEDICAL RECORD NO.:  [id number]          PATIENT TYPE:  INP   LOCATION:  2550                         FACILITY:  [facility]   PHYSICIAN:  Di Kindle. Edilia Bo, M.D.DATE OF BIRTH:  [birth year]   DATE OF CONSULTATION:  05/30/2005  DATE OF DISCHARGE:                                   CONSULTATION   REASON FOR CONSULTATION:  Bleeding from the right groin with hypotension.   HISTORY:  This is a 77 year old woman who was transferred from [facility] for possible percutaneous coronary intervention. The patient had  undergone cardiac catheterization in [place] via right femoral  approach. Today, she developed some oozing in the right groin was controlled  with pressure but later in the day developed the sudden onset of swelling in  the right groin with significant pain and became hypotensive. Vascular  surgery was consulted urgently and upon arrival pressure was being held for  control of bleeding. She had a large hematoma in her thigh under significant  pressure.   PAST MEDICAL HISTORY:  1.  Significant for hypertension.  2.  Dyslipidemia.  3.  In addition, she has mild renal insufficiency.   PAST SURGICAL HISTORY:  1.  Significant for hysterectomy.  2.  Appendectomy.  3.  Hemorrhoidectomy.  4.  Laminectomy.   SOCIAL HISTORY:  She is widowed. She has two daughters. She does not  currently smoke tobacco.   PHYSICAL EXAMINATION:  Initially was 130/70, but later dipped down into the  70s. On exam, she had large hematoma on the right thigh under significant  pressure. The skin was tense. Could not palpate pedal pulses distally but  the foot appeared pink and adequately perfused.   This patient has evidence of active bleeding in the right groin has taken  urgently to the operating room for exploration and repair of the artery. I  discussed the indications for the procedure and potential complications  including  but not limited to bleeding and the wound healing problems. All of  her questions were answered. She was agreeable to proceed.      Di Kindle. Edilia Bo, M.D.  Electronically Signed     CSD/MEDQ  D:  05/30/2005  T:  05/31/2005  Job:  [id number]

## 2010-10-19 NOTE — H&P (Signed)
[name], [name]              ACCOUNT NO.:  [id number]   MEDICAL RECORD NO.:  [id number]           PATIENT TYPE:   LOCATION:                                 FACILITY:   PHYSICIAN:  Lennis P. Darrold Span, M.D.DATE OF BIRTH:  [birth year]   DATE OF ADMISSION:  11/08/2005  DATE OF DISCHARGE:                                HISTORY & PHYSICAL   Patient is a 77 year old lady followed by Dr. Arbutus Ped for a stage IV non-  small cell lung carcinoma on active chemotherapy (third cycle of  carboplatin/Taxotere given on June 4 with Neulasta on June 5) as well as  multiple other significant comorbidities and drug allergies admitted with  probable skin abscess and progressive cellulitis right forehead to the right  periorbital region.  She had had a small area of some skin irritation for  possibly a few weeks which has developed increased erythema and has been  more prominent.  This morning she had new swelling of the periorbital region  on the right to the point that she was not able to open her eye.  She has  had no pain around her eye and vision is intact.  She has not been febrile  or had chills.  She does have a central catheter.  She had a right groin  wound infection in January 2007 which was treated in-hospital with IV  antibiotics, though that information is not presently available to me  otherwise.   Patient was evaluated for increased shortness of breath in March 2007 with  findings of non-small cell carcinoma left upper lobe (favor adeno) with PET  scan positive mediastinal, bilateral hilar, right subpectoral,  hepatoduodenal, and retroperitoneal lymph nodes and possibly a solitary  liver metastasis.  MRI of her brain was negative then.  She has tolerated  chemotherapy with carboplatin and Taxotere well overall including the most  recent treatment on June 4.   PAST MEDICAL HISTORY:  1.  Significant for ischemic cardiomyopathy with an ejection fraction of      30%.  2.  Renal  insufficiency with BUN and creatinine 43 and 3.4 in March 2007.  3.  Right groin hematoma following catheterization in December 2006 which      required repair by Dr. Edilia Bo then an admission with a wound infection      in January 2007.  4.  Hypertension.  5.  GERD.   She is allergic to PENICILLIN, CEPHALOSPORINS, CIPRO, SULFA, and STATINS,  ALL ANTIBIOTICS causing severe rashes.   REVIEW OF SYSTEMS:  No nausea.  No increased shortness of breath or chest  pain.  Bowels have been moving.  She has been taking p.o.'s well.   FAMILY HISTORY:  Brother and sister both with lung cancer.  Brother with a  stomach cancer.  Mother died with congestive heart failure.   PAST HISTORY:  Otherwise, hypertension, elevated cholesterol, post  hysterectomy and laminectomy.   SOCIAL HISTORY:  She is widowed.  Has a daughter, Dois Davenport, in [place] who  is healthcare power of attorney and a second daughter in [place].  She smoked a pack a  day for more than 30 years, none the last 15 years.  She  previously worked as a Nutritional therapist.  She denies alcohol.  She does  not have living will completed and we have discussed this; however, at this  point she still wants everything done.   PHYSICAL EXAMINATION:  GENERAL:  She is easily mobile and talkative, not in  acute distress.  VITAL SIGNS:  Temperature 98.1, heart rate 78 and regular, respirations 20  and not labored on room air.  Blood pressure 139/66.  Her weight is up 4  pounds to 159.8.  HEENT:  Alopecia.  2 x 2 x 2 cm erythematous nodular area with central  eschar right frontal region with slight erythema streaking down and  periorbital edema on the right.  No vesicular lesions or other rash around  the eye.  Pupils are equal and reactive.  Mouth is moist and clear.  LUNGS:  Diminished breath sounds bilaterally, otherwise clear to  auscultation, percussion.  HEART:  Regular rate and rhythm without gallop.  ABDOMEN:  Soft, nontender.   Few bowel sounds present.  EXTREMITIES:  Lower extremities:  No edema, cords, tenderness.   LABORATORIES:  Today white count 16,000, ANC of 14.8 post Neulasta on June  5, hemoglobin 11.3, platelets 143.  CMET on June 4:  Sodium 140, potassium  4.6, chloride 105, CO2 22, glucose 170, BUN 40, creatinine 2.1, total  bilirubin 0.4, alkaline phosphatase 125, GOT 20, GPT 20, total protein 6.9,  albumin of 4, calcium of 9.   IMPRESSION AND PLAN:  1.  Progressive apparent cellulitis and skin abscess right frontal region      extending to the periorbital area in a patient immunocompromised with      active chemotherapy and no reasonable oral antibiotic choices as well as      significant comorbidities.  She agrees to admission.  Will have general      surgery see for possible I&D and culture.  Pharmacy to help with      vancomycin doses and follow her counts.  2.  Metastatic small cell lung carcinoma.  3.  Known cardiac disease and low ejection fraction followed by Dr. Riley Kill.  4.  Renal insufficiency known to Dr. Briant Cedar and that service.      Lennis P. Darrold Span, M.D.  Electronically Signed     LPL/MEDQ  D:  11/08/2005  T:  11/08/2005  Job:  [id number]   cc:   Lajuana Matte, MD  Fax: [phone]   Arturo Morton. Riley Kill, M.D. [facility]  [address]  [address]  [place]  [place] [address]   Dyke Maes, M.D.  Fax: [phone]

## 2010-10-19 NOTE — Consult Note (Signed)
[name], [name] NO.:  [id number]   MEDICAL RECORD NO.:  [id number]          PATIENT TYPE:  INP   LOCATION:  5020                         FACILITY:  [facility]   PHYSICIAN:  Dyke Maes, M.D.DATE OF BIRTH:  06-07-1933   DATE OF CONSULTATION:  06/15/2005  DATE OF DISCHARGE:                                   CONSULTATION   REASON FOR CONSULTATION:  Acute on chronic renal insufficiency.   HISTORY OF PRESENT ILLNESS:  This 77 year old white female admitted on  June 11, 2005, for right groin infection. On May 29, 2005, she  underwent a heart catheterization in [place] and subsequently  transferred to [facility] to undergo PTCA of her RCA. En route,  however, she developed a large hematoma of the right groin which  subsequently required evacuation on December 28th. She did undergo a second  heart catheterization on June 04, 2005, with a stent in her RCA. She  denies any history of chronic kidney disease. She denies any history of  gross hematuria, kidney stones, or significant use of nonsteroidal  medications. She does use Indocin for time to time for gout, but has had  none recently. She has been on Atacand for blood pressure control. She has  had high blood pressure for at least 10 years and apparently has been under  good control. Her serum creatinine has been quite variable over the last two  weeks. Over December 24th serum creatinine was 2.0, on December 28th was  1.8, on December 30th was 1.4, on January 1st was 1.4, and on January 3rd  was 1.5. On admission serum creatinine was 2.3 and today it is 2.8.  She has  remained on Avapro as a substitution for the Atacand. A renal ultrasound  done in [place] on June 14, 2004, showed a right kidney of 8.4 and  left of 8.8 with no evidence of hydronephrosis. Apparently she saw a  nephrologist while at [facility] whose findings were that she had chronic  kidney disease related to  hypertension. I was unable to find a urinalysis  that was done at [facility].   PAST MEDICAL HISTORY:  1.  Hypertension for at least 10 years.  2.  Chronic kidney disease presumably secondary to hypertension.  3.  Hyperlipidemia.  4.  Coronary artery disease, status post RCA stent. Echo showed normal left      ventricular function.  5.  Status post appendectomy.  6.  Status post hysterectomy.  7.  Status post laminectomy.  8.  An 8.5 mm left upper lung nodule that was seen on CT scan on June 06, 2005.  9.  Gout.   ALLERGIES:  KEFLEX, ASPIRIN, CIPROFLOXACIN, PENICILLIN, SULFA, STATINS.   CURRENT MEDICATIONS:  1.  Plavix 75 mg daily.  2.  Protonix 40 mg daily.  3.  Avapro 300 mg daily.  4.  Nu-Iron 150 mg b.i.d.  5.  Lopressor 50 mg b.i.d.  6.  Vancomycin IV per pharmacy.   SOCIAL HISTORY:  She is an ex-smoker. She denies any alcohol. She lives in  [place].  FAMILY HISTORY:  Mother died of problems from congestive heart failure.  Father died from a ruptured gallbladder. Negative for renal disease and  negative hypertension.   REVIEW OF SYSTEMS:  Appetite has been good. Energy level good. No shortness  of breath. No chest pain or chest pressures. No recent change in bowel  habits. No new arthritic complaints. She does have an occasional flare of  gout. No neuropathic symptoms. No dysuria.  The rest of the review of  systems are unremarkable.   PHYSICAL EXAMINATION:  VITAL SIGNS: Blood pressure 129/71, pulse 95,  temperature 97.4.  GENERAL: A healthy appearing 77 year old white female in no acute distress.  HEENT: Sclerae nonicteric. Extraocular movements are intact.  NECK: No jugular venous distention.  LUNGS: Clear to auscultation.  HEART: Regular rate and rhythm without murmurs, rubs, or gallops.  ABDOMEN: Positive bowel sounds, nontender, nondistended, no  hepatosplenomegaly. I was unable to hear any abdominal bruits though her  bowel sounds were  quite active.  EXTREMITIES: No clubbing, cyanosis, or edema. There is no evidence for  cholesterol emboli at this time. Pulses 2/4 and equal throughout. Right  groin reveals some erythema. There is a bandage in place which I did not  remove.  NEUROLOGIC: Cranial nerves intact. Motor/sensory intact. No asterixis.   LABORATORY DATA:  Sodium 135, potassium 3.5, bicarbonate 24, BUN 23,  creatinine 2.8, glucose 117, hemoglobin 11.0.   IMPRESSION:  1.  Acute on chronic renal insufficiency most likely secondary to      hemodynamic changes from infection. Avapro plus or minus volume      depletion. Certainly cannot rule out some underlying renal vascular      disease which may be a compounding factor. This may need workup in the      future if there is some evidence to her having renal vascular disease. I      would hold off on any evaluation at the present time.  2.  Right groin infection.  3.  Hypertension.  4.  Coronary artery disease.   RECOMMENDATIONS:  1.  I would discontinue the Avapro.  2.  Discontinue the Indocin.  3.  I agree with IV fluids.  4.  Will check a urinalysis.  5.  If her blood pressure markedly increases off the Avapro this may be more      evidence that she could have significant renal vascular disease and at      that time I would consider evaluation.  6.  Daily BMET following in her renal function.   Thank you very much for this consultation. I will follow the patient with  you.           ______________________________  Dyke Maes, M.D.     MTM/MEDQ  D:  06/14/2005  T:  06/15/2005  Job:  [id number]

## 2010-10-19 NOTE — Discharge Summary (Signed)
[name], [name] NO.:  [id number]   MEDICAL RECORD NO.:  [id number]            PATIENT TYPE:   LOCATION:                                 FACILITY:   PHYSICIAN:  Di Kindle. Edilia Bo, M.D.DATE OF BIRTH:   DATE OF ADMISSION:  06/11/2005  DATE OF DISCHARGE:  06/17/2005                               DISCHARGE SUMMARY   BRIEF HISTORY:  This is a 77 year old woman who had undergone a cardiac  catheterization May 29, 2005, at [place] [facility].  She subsequently  transferred to [facility] to undergo right coronary artery intervention.  She  developed a right groin hematoma was taken urgently to the operating  room for evacuation of hematoma and repair of the right femoral artery.  She subsequently underwent intervention of the right coronary artery.  She returned to our office on a followup visit and was noted have  drainage from the right groin.  She was admitted for IV antibiotics and  debridement in the operating room.  The remainder of the History and  Physical is as dictated without additional evolution.   HOSPITAL COURSE:  The patient was admitted on June 11, 2005.  She  underwent dressing changes to her right groin wound and was placed on  intravenous vancomycin.  It was felt that, with continued dressing  changes, we might be able to hold out on operative intervention.  Some  excision and debridement was done at the bedside on June 13, 2005.  The wound was packed deeper. The dressing changes were increased to  three times a day.  She had a low-grade fever to 100.  Note:  Cardiology  followed her throughout her hospitalization.  She was also seen by  nephrology during the admission for chronic renal insufficiency.  They  likewise followed her during this admission.  She remained afebrile.  Her dressing changes were continued, and her intravenous antibiotics  were continued.  It was decided that she could ultimately go home with  home health doing her  dressing changes, and on June 17, 2005, she was  discharged home with home health to do b.i.d. dressing changes.  At that  point, she did not require antibiotics.  I was to follow her in the  office in 1 week.   FOLLOWUP:  In one week.   CONDITION ON DISCHARGE:  Good.   DISCHARGE DIAGNOSES:  1. Status post incision and drainage of hematoma, right groin, after      cardiac catheterization.  2. Hypertension.  3. Dyslipidemia.  4. History of renal insufficiency.  5. History of gastroesophageal reflux.   MEDICATIONS ON DISCHARGE:  1. Plavix 75 mg daily/  2. Metoprolol 25 mg p.o. b.i.d.  3. Protonix 40 mg daily.  4. Nu-Iron 150 mg p.o. b.i.d.  5. Atacand 32 mg daily.  6. Sublingual nitroglycerin p.r.n.      Di Kindle. Edilia Bo, M.D.  Electronically Signed     CSD/MEDQ  D:  10/07/2006  T:  10/07/2006  Job:  [id number]

## 2010-10-19 NOTE — Assessment & Plan Note (Signed)
[facility]                              CARDIOLOGY OFFICE NOTE   NAME:Dampier, [name]                     MRN:          [id number]  DATE:04/14/2006                            DOB:          08-Jul-1933    HISTORY OF PRESENT ILLNESS:  [name] is in for follow up.  She really  feels great.  She has had recent CT scanning which has suggested that her  lung tumor is stable.  Her laboratory studies have also improved, and her  most recent creatinine was only 1.8.  Of note, her hemoglobin was also up to  12.4.  Her hair is starting to return, and she feels quite well.   CURRENT MEDICATIONS:  1. Protonix 40 mg daily.  2. Plavix 75 mg daily.  3. Lasix 20 mg daily.  4. K-Dur 10 mEq daily.  5. Coreg 12.5 mg daily.   PHYSICAL EXAMINATION:  GENERAL:  She is alert and oriented in no distress.  VITAL SIGNS:  Blood pressure 145/82, pulse 65.  LUNGS:  Fields are really quite clear.  CARDIAC:  Regular without a significant murmur.  EXTREMITIES:  No edema.   IMPRESSION:  1. Coronary artery disease with prior myocardial infarction.  2. Previous groin bleed requiring surgery.  3. Post intervention renal dysfunction which has returned to normal.  4. Hypercholesterolemia with both Statin and Zetia intolerance.  5. Stage IV nonsmall cell cancer of the lung diagnosed in March 2007.   PLAN:  1. Continue current medical regimen.  2. Return to clinic in two months.  3. Repeat echocardiogram as previous EF was 30-40%, but discussion of      defibrillator deferred because of treatment of malignancy.     Arturo Morton. Riley Kill, MD, [facility]  Electronically Signed    TDS/MedQ  DD: 04/14/2006  DT: 04/14/2006  Job #: [id number]

## 2010-10-19 NOTE — Consult Note (Signed)
NAMEKENNICE, FINNIE NO.:  [id number]   MEDICAL RECORD NO.:  [id number]          PATIENT TYPE:  INP   LOCATION:  5020                         FACILITY:  [facility]   PHYSICIAN:  Rollene Rotunda, M.D.   DATE OF BIRTH:  1934/03/23   DATE OF CONSULTATION:  06/11/2005  DATE OF DISCHARGE:                                   CONSULTATION   PRIMARY:  Sheela Stack, M.D., [place], [place].   REASON FOR CONSULTATION:  Evaluate patient with coronary disease.   HISTORY OF PRESENT ILLNESS:  The patient is a pleasant 77 year old white  female, who had unstable angina and apparent abnormal Cardiolite shortly  before Christmas.  She subsequently underwent catheterization in  [place], and was found to have more or less totally occluded TIMI 0.5  flow in her right coronary artery.  She was referred to [facility] for  intervention.  This was initially deferred because of renal insufficiency.  Subsequently, developed a groin hematoma.  She underwent emergent repair of  right femoral artery with evacuation of hematoma.  She had some chest  discomfort and mild enzyme elevations following this.  She underwent  elective bare-metal stenting of the right coronary artery on June 04, 2005.  She was discharged home and did well.  However, she had some erythema  and drainage.  She is now admitted for IV antibiotics and exploration with  probable debridement of the infected right femoral wound.   Of note, the patient has been ambulating in her house.  She has been getting  none of the chest pressure that she had prior to her intervention.  She  denies any neck or arm discomfort.  She has had no shortness of breath.  She  has no PND or orthopnea.  She has had no fevers or chills.  She did have  swelling in her right leg with pain down throughout her leg that is slowly  resolving.  She has had a weight gain related to what she thought was fluid  in the hospital.   PAST MEDICAL  HISTORY:  1.  Coronary artery disease status post bare-metal stenting June 04, 2005.  2.  Right groin hematoma status post repair.  3.  Dyslipidemia.  4.  Hypertension.  5.  Gastroesophageal reflux disease.  6.  Gout.  7.  Mild renal insufficiency (creatinine 1.8 to 2.)   ALLERGIES:  1.  KEFLEX.  2.  STATINS.  3.  ZETIA.  4.  CIPRO.  5.  SULFA.  6.  ASPIRIN.   MEDICATIONS:  1.  Plavix 75 mg q. day.  2.  Metoprolol 25 mg b.i.d.  3.  Atacand 32 mg q. day.  4.  Protonix 40 mg q. day.  5.  Nu-Iron.   SOCIAL HISTORY:  The patient lives in [place] alone.  She is a widow.  She has two children.  She does not smoke cigarettes or drink alcohol.   FAMILY HISTORY:  Contributory for her mother dying of heart failure at age  7.   REVIEW OF SYSTEMS:  As stated in HPI and positive for cold intolerance.  PHYSICAL EXAMINATION:  VITAL SIGNS:  Blood pressure 146/74, heart rate 71  and regular, afebrile.  GENERAL:  The patient is in no distress.  HEENT:  Eyes unremarkable.  Pupils equal, round and reactive to light.  Fundi are visualized, oral mucosa unremarkable.  NECK:  No jugular venous distention, waveform within normal limits, carotid  upstroke brisk and symmetric, no bruits, no thyromegaly.  LYMPHATICS:  No cervical, axillary or inguinal adenopathy.  LUNGS:  Clear to auscultation bilaterally.  BACK:  No costovertebral angle tenderness.  CHEST:  Unremarkable.  HEART:  PMI displaced or sustained, S1 and S2 within normal limits, no S3,  positive S4, no murmurs.  ABDOMEN:  Flat, positive bowel sounds, normal in frequency and pitch, no  bruits, rebound, guarding, no midline pulsatile mass, no hepatomegaly, no  splenomegaly.  SKIN:  No rashes, no nodules.  EXTREMITIES:  2+ upper pulses, 2+ femoral dorsalis pedis, right groin wound  with erythema, induration, purulent drainage, diffuse edema with tenderness  of her entire leg on the right, no cyanosis, no clubbing.  NEUROLOGIC:   Oriented to person, place and time, cranial nerves 2 through 12  are grossly intact, motor grossly intact.   LABORATORY/CHEST X-RAY/EKG:  Pending.   ASSESSMENT AND PLAN:  1.  Coronary disease.  Patient had a recent PCI with bare-metal stenting.      She will be at some risk for any operative procedure.  This would be      minimized with a local anesthetic.  She should continue her beta blocker      and I will increase the dose as tolerated.  In addition, she should      continue her Plavix unless Dr. Edilia Bo thinks the bleeding risk is      excessive.  Will discuss this with him.  As this is not an elective      procedure and will most likely not get better with antibiotics and      conservative therapy, surgery will need proceed with some increased      risk for acute coronary events minimized as above.  2.  Dyslipidemia.  Unfortunately, the patient has not been able to tolerate      statins.  We will continue with conservative measures and we discussed      dietary modifications that might help.           ______________________________  Rollene Rotunda, M.D.     JH/MEDQ  D:  06/11/2005  T:  06/12/2005  Job:  [id number]   cc:   Di Kindle. Edilia Bo, M.D.  [address]  [place]  [place] [address]

## 2010-10-19 NOTE — Consult Note (Signed)
[name], CANTU NO.:  [id number]   MEDICAL RECORD NO.:  [id number]          PATIENT TYPE:  INP   LOCATION:  1328                         FACILITY:  [facility]   PHYSICIAN:  Lebron Conners, M.D.   DATE OF BIRTH:  1933/09/27   DATE OF CONSULTATION:  11/08/2005  DATE OF DISCHARGE:  11/10/2005                                   CONSULTATION   CHIEF COMPLAINT:  Swelling on the head and face.   PRESENT ILLNESS:  [name] is a 77 year old white female under treatment  for lung cancer chemotherapy who has developed an abscess on the right side  of the frontal scalp and then subsequently despite oral antibiotics got a  lot of swelling and had swollen up so that it began to close her eye.  Dr.  Darrold Span put her in the hospital on this date for more intensive treatment  and consulted me to consider drainage of the abscess.  The patient is not  prone to get infections but she has been on chemotherapy reducing her  ability to fight off infection.  This has been present for several days.  She thinks it may have started where we had rubbed her skin a little bit.  She does not note any other similar abscess elsewhere.  She has not had  fever or chills and has felt good except for the local discomfort.   PAST MEDICAL HISTORY:  Generally sound except for the cancer and treatment  therefore.   EXAM:  The patient is tearful and in no acute distress.  Vital signs are  unremarkable.  There is a fluctuant abscess on the right frontal scalp.  There was considerable cellulitis and swelling around it.  No neurological  deficit is detected.   PROCEDURE:  After the patient gave permission, I anesthetize the skin over  the abscess and did an I&D draining copious amount of yellow pus.  I took a  culture.  I thoroughly washed out the cavity.  I found no evidence of  sebaceous cyst or other previously existing problem.  I packed the cavity  and applied a bandage.  She tolerated it  well.      Lebron Conners, M.D.  Electronically Signed     WB/MEDQ  D:  11/09/2005  T:  11/10/2005  Job:  [id number]   cc:   Lennis P. Darrold Span, M.D.  Fax: [phone]

## 2010-10-19 NOTE — H&P (Signed)
[name], [name] NO.:  [id number]   MEDICAL RECORD NO.:  [id number]          PATIENT TYPE:  INP   LOCATION:  3743                         FACILITY:  [facility]   PHYSICIAN:  Arturo Morton. Riley Kill, M.D. [employer] OF BIRTH:  [birth year]   DATE OF ADMISSION:  08/16/2005  DATE OF DISCHARGE:                                HISTORY & PHYSICAL   CHIEF COMPLAINT:  I'm here for new medication.   HISTORY OF PRESENT ILLNESS:  [name] is a very delightful 77 year old  woman who presents for alteration of her medications.  This nice lady  developed chest pain in December of this year.  She was admitted to  [facility] and underwent cardiac catheterization which  demonstrated a high-grade right coronary stenosis. She was subsequently  transferred for percutaneous intervention.  At that time she had modest  renal insufficiency.  Following this, she was given IV hydration.  While on  intravenously heparin, developed a groin hematoma and had to go emergently  to the operating room for vascular repair.  Following the vascular repair,  she developed positive enzymes and chest pain which eventually resolved.  She was subsequently taken back to the catheterization laboratory where she  underwent percutaneous intervention to the right coronary artery with  stenting.  She has not had recurrent chest pain since that time.  She has  been on beta blockade.  She was subsequently noted to have elevated  creatinine and some suggestion of atheroembolization of her feet.  Her  creatinine rose to as high as 4.6.  More recently it has dropped to about  3.1.  She has felt fatigued on beta blockade.  With this, she elected to cut  back on the dose.  Her blood pressure went up.  She was seen in the office,  and her creatinine was actually down to 3.1.  Subsequent 2-D echocardiogram  suggested reduction in overall LV function with an ejection fraction in the  range of 30%.  There was moderate  global hypokinesis related to this.  She  also was having increasing shortness of breath, difficulty lying down.  She  has lost some weight on the oral Lasix.  I discussed her case with Dr. Caryn Section  today, and we elected to bring her into the hospital for initiation of Coreg  and discontinuation of Toprol.  Dr. Caryn Section has recommended that we try to avoid  angiotensin receptor blockers at this point in time.  She denies any ongoing  chest pain or significant shortness of breath, and this has been approved.  Of note, the patient also had a right groin wound infection which required  hospitalization in early January.   PAST MEDICAL HISTORY:  1.  Hypertension.  2.  Dyslipidemia.  3.  Mild renal insufficiency.  4.  Upper respiratory infection.  5.  Gastroesophageal reflux.  6.  Status post appendectomy.  7.  Cataract surgery.  8.  Hysterectomy.  9.  Hemorrhoidectomy.  10. Laminectomy.  11. Status post percutaneous coronary intervention.  12. Right groin hematoma.   ALLERGIES:  1.  KEFLEX.  2.  ASPIRIN.  3.  CIPRO.  4.  AMOXICILLIN.  5.  STATINS.   MEDICATIONS:  1.  Plavix 75 mg daily.  2.  Metoprolol 25 mg p.o. twice daily.  3.  Protonix 40 mg daily.  4.  Lasix 20 mg daily.   SOCIAL HISTORY:  The patient is widowed.  She has 2 children.  She quit  smoking, and she does not drink.   REVIEW OF SYSTEMS:  Negative for diarrhea, blood in her stool, or  hematemesis, respiratory infection, or ear, nose, throat abnormalities.   PHYSICAL EXAMINATION:  GENERAL: Alert and oriented female in no acute  distress.  VITAL SIGNS: Blood pressure 160/80, pulse 85, respirations 18. She is  afebrile.  NECK:  The jugular veins are not significantly distended at the present  time.  LUNGS:  Fields are clear to auscultation and percussion.  CARDIAC: Rhythm is regular without significant murmur or rub.  ABDOMEN: Soft.  EXTREMITIES:  Reveal trace edema. There is evidence of probable atheroemboli  in  the toes bilaterally.  NEUROLOGIC: Exam is nonfocal.  GENITOURINARY/RECTAL:  Exams deferred.   IMPRESSION:  1.  Coronary artery disease status post percutaneous coronary intervention.  2.  Right groin hematoma following diagnostic catheterization in      [place] with subsequent vascular repair.  3.  Probable atheroembolization with elevated creatinine.  4.  Hypertension, poorly controlled.  5.  Hyperlipidemia with STATIN intolerance.   DISPOSITION:  1.  Admit to the hospital for initiation of Coreg therapy.  2.  We will use IV diuretics.  3.  We will monitor her creatinine closely.  4.  Nephrology consultation will be obtained in the morning.   We anticipate a short hospitalization.  This has been discussed with the  patient in detail.      Arturo Morton. Riley Kill, M.D. [facility]  Electronically Signed     TDS/MEDQ  D:  08/16/2005  T:  08/18/2005  Job:  [id number]

## 2010-10-19 NOTE — H&P (Signed)
NAMECHRISTIEN, [name] NO.:  [id number]   MEDICAL RECORD NO.:  [id number]          PATIENT TYPE:  INP   LOCATION:  3705                         FACILITY:  [facility]   PHYSICIAN:  Duke Salvia, M.D.  DATE OF BIRTH:  30-Apr-1934   DATE OF ADMISSION:  05/29/2005  DATE OF DISCHARGE:                                HISTORY & PHYSICAL   Ms. [name] is a 77 year old woman admitted in transfer from  [facility] in anticipation of percutaneous coronary intervention.   Ms. [name] is a 77 year old woman with cardiac risk factors noted for  longstanding hypertension, remote history of smoking, and dyslipidemia  manifested by a low HDL and a high LDL for which treatment has been  intolerable secondary to side effects of multiple drugs.  She began to  develop substernal chest pain on Friday night before Christmas with  radiation into her left arm.  It was accompanied by progressive exercise  intolerance.  Because of this, she was admitted to [facility]  where her enzymes were borderline abnormal.  She underwent an echo which  demonstrated normal left ventricular function.  She underwent a nuclear  imaging which demonstrated inferior ischemia and then underwent  catheterization today, which by the patient's report demonstrated high grade  RCA disease.  PCI was recommended and the patient elected to be transferred  to [facility].   PAST MEDICAL HISTORY:  1.  Renal insufficiency with creatinine of 2 on admission.  It was 1.8 on      December 26.  Repeat post catheterization is not yet available.  2.  Dyslipidemia as noted.  3.  Hypertension which is presumably the reason for #1.  4.  Aspirin intolerance.  5.  Recent upper respiratory tract infection questionably related to her      presentation of unstable angina.  6.  Reflux esophagitis.   PAST SURGICAL HISTORY:  1.  Appendectomy.  2.  Cataract surgery.  3.  Hysterectomy.  4.   Hemorrhoidectomy.  5.  Laminectomy.   REVIEW OF SYSTEMS:  Notable primarily as above.   SOCIAL HISTORY:  She lives in [place].  She has a daughter who is  actively involved in her care.  She does not use alcohol or recreational  drugs and she is very active.   ALLERGIES:  KEFLEX, ASPIRIN, CIPRO, AMOXICILLIN, STATINS, ZETIA, SULFA,  TAPE.   CURRENT MEDICATIONS:  1.  Atacand 32.  2.  Plavix 75.  3.  Zantac 150 b.i.d.  4.  Tenormin 25 b.i.d.  5.  Lipitor again resumed with prior intolerances.  6.  Mucinex 600 b.i.d.  7.  Nitro-Dur 0.3 daily.  8.  Percocet p.r.n.   PHYSICAL EXAMINATION:  GENERAL:  She is an elderly Caucasian female  appearing her stated age of 77.  VITAL SIGNS:  Blood pressure 114/58, pulse 59, weight 177 pounds.  HEENT:  No icterus or xanthomas.  NECK:  Neck veins were flat.  Carotids were brisk and full bilaterally  without bruits.  BACK:  Without kyphosis or scoliosis.  LUNGS:  Clear.  HEART:  Heart sounds were  regular.  No significant murmurs were appreciated,  although, a murmur was appreciated in [place].  ABDOMEN:  Soft with active bowel sounds without midline pulsation or  hepatosplenomegaly.  Femoral pulses were 2+ on the left and right was  examined and there was a little bit of ecchymosis.  Distal pulses intact.  There is no cyanosis, clubbing, or edema.  NEUROLOGY:  Grossly normal.  SKIN:  Warm and dry.   Records were reviewed from [facility] including the electrocardiogram  demonstrating sinus rhythm with intervals of 0.23/0.08/0.43.  There were T  wave inversions and ST segment depression in the inferior leads which had  persisted since her admission.   LABORATORY DATA:  Abnormalities were as noted previously.  Hemoglobin was  normal.  Creatinine was abnormal as noted.  Troponins were progressively  elevated, initially at 0.09 with a peak of 0.23.  Blood sugar was elevated  on presentation.  Repeat were borderline.  Hemoglobin A1C  was not obtained  that I can find.  LDL was 155, HDL was 31.   IMPRESSION:  1.  Coronary artery disease status post catheterization in [place]      demonstrating high grade right coronary artery lesion with normal left      ventricular function.  2.  Hypertension.  3.  Dyslipidemia with low HDL and high LDL.  4.  Renal insufficiency.  5.  Glucose intolerance.  6.  Recent upper respiratory tract infection.  7.  Aspirin intolerance.   Ms. [name] has coronary artery disease in the context of multiple risk  factors.  She also has renal insufficiency with an estimated creatinine  clearance of about 35 mL.  Because of that, I think deferring her  intervention for 48 hours to make sure that her creatinine is not bumping  from her contrast load today is reasonable.  We will continue her beta  blockers, her heparin.           ______________________________  Duke Salvia, M.D.    SCK/MEDQ  D:  05/29/2005  T:  05/30/2005  Job:  [id number]

## 2010-10-19 NOTE — Consult Note (Signed)
NAMEMARLENY, [name] NO.:  [id number]   MEDICAL RECORD NO.:  [id number]          PATIENT TYPE:  INP   LOCATION:  3743                         FACILITY:  [facility]   PHYSICIAN:  Leslye Peer, M.D.  DATE OF BIRTH:  04-18-1934   DATE OF CONSULTATION:  08/19/2005  DATE OF DISCHARGE:                                   CONSULTATION   REASON FOR CONSULTATION:  We are asked by Dr. Riley Kill to evaluate [name]  for abnormal CT scan of the chest.   BRIEF HISTORY:  [name] is a very pleasant 77 year old woman with a  history of remote tobacco abuse, coronary artery disease, hypertension,  renal insufficiency, and GERD.  She was admitted on August 16, 2005, to  undergo titration of Carvedilol to treat her coronary artery disease and  hypertension.  She has had a CT scan of the chest performed in January 2007  which identified small bilateral pulmonary nodules, the largest of which was  approximately 8 mm in diameter in the left upper lobe.  She was scheduled  for an appointment with Dr. Shan Levans to plan follow up CTs and  possible diagnostic evaluation in April.  While she was here, she had her  repeat CT scan of the chest which has identified additional right sided  nodules and enlargement of the previously identified left upper lobe nodule.  We are consulted now to assist with obtaining a tissue diagnosis.   PAST MEDICAL HISTORY:  1.  Hypertension.  2.  Coronary artery disease status post PTCI.  3.  Right groin hematoma requiring surgical correction.  4.  Renal insufficiency.  5.  GERD.  6.  Status post appendectomy.  7.  Status post hysterectomy.  8.  Status post hemorrhoidectomy.  9.  Status post cataract surgery.  10. Status post laminectomy.   ALLERGIES:  KEFLEX, ASPIRIN, CIPROFLOXACIN, AMOXICILLIN, STATINS.   CURRENT MEDICATIONS:  1.  Coreg 12.5 mg p.o. b.i.d.  2.  K-Dur 10 mEq daily.  3.  Lanoxin 0.0625 mg daily.  4.  Lasix 20 mg p.o. b.i.d.  5.   Plavix 75 mg daily.  6.  Protonix 40 mg daily.  7.  Ambien 5 mg p.o. q.h.s. p.r.n.  8.  Maalox p.r.n.  9.  Milk of Magnesia p.r.n.  10. Tylenol p.r.n.  11. Xanax 0.25 mg p.o. b.i.d. p.r.n.   SOCIAL HISTORY:  The patient is widowed, she has two children, she is a  former smoker with approximately 15-20 pack year tobacco history having quit  greater than 40 years ago.  She denies any significant occupational  exposure.  She has never been exposed to TB and does not have any TB risk  factors.   REVIEW OF SYSTEMS:  Negative for any fevers, chills, weight loss, or other  constitutional symptoms.  She has had no cough, dyspnea, sputum production,  or hemoptysis.   PHYSICAL EXAMINATION:  GENERAL:  This is a very pleasant elderly woman who is in no acute distress  on room air.  VITAL SIGNS:  Temperature 97.7, blood pressure 124/71, heart rate 67,  respiratory  rate 20, SpO2 99% on room air.  HEENT:  No oral lesions, there is no stridor.  Pupils equal, round, reactive  to light and accommodation.  LUNGS:  Mostly clear with a few bibasilar crackles, there are no wheezes.  HEART:  Distant but regular without murmur.  ABDOMEN:  Soft, nontender, nondistended, positive bowel sounds.  EXTREMITIES:  No cyanosis, clubbing, and edema.  NEUROLOGICAL:  She has a non-focal exam.   LABORATORY DATA:  White blood cell count 6, hematocrit 34.7, platelets 262,  sodium 136, potassium 3.8, chloride 97, CO2 27, BUN 37, creatinine 3.6,  glucose 96, calcium 9.1, BNP 1167 down from 1390.  CT scan of the chest  performed on this admission shows bilateral nodules that appear to be in a  vascular distribution.  The largest was in the left upper lobe as mentioned  above.  She has some mild hilar and mediastinal lymphadenopathy with no  nodes that are greater than 1 cm in diameter.  There are no notable  infiltrates.  She has a small right sided pleural effusion.   IMPRESSION:  Bilateral pulmonary nodules, larger  by CT scan when compared  with her former scan from January 2007 and worrisome for possible  malignancy.  I discussed the possible diagnoses and the possible strategies  for obtaining tissue.  The patient definitely wants to pursue a tissue  diagnosis at this time.  I feel that the best strategy would be a CT guided  needle biopsy of her peripheral left upper lobe lesion.  I will help to  arrange for this either as an outpatient after she has been off Plavix or  during this hospitalization.  I believe she will be here long enough to  discontinue the Plavix for radiology to perform the procedure.  I need to  investigate how long the Plavix needs to be held before they would be  comfortable performing the needle biopsy.  I will discuss the details of  possible biopsy with [name] tomorrow.           ______________________________  Leslye Peer, M.D.     RSB/MEDQ  D:  08/19/2005  T:  08/20/2005  Job:  [id number]   cc:   Arturo Morton. Riley Kill, M.D. [facility]  [address]  [address]  [place]  [place] [address]   Shan Levans, M.D. [facility]  [address]  [place]  [place] [address]

## 2011-03-28 ENCOUNTER — Telehealth: Payer: Self-pay | Admitting: Cardiology

## 2011-03-28 NOTE — Telephone Encounter (Signed)
I spoke with the pt and made her aware that she needs to see her PCP in regards to her symptoms. Pt agreed with plan.

## 2011-03-28 NOTE — Telephone Encounter (Signed)
Pt called. She said she has a lot of congestion she would like an antibiotic called to [facility] in [place]

## 2011-04-08 ENCOUNTER — Ambulatory Visit (HOSPITAL_COMMUNITY)
Admission: RE | Admit: 2011-04-08 | Discharge: 2011-04-08 | Disposition: A | Discharge status: Home / Self Care | Payer: Medicare Other | Source: Ambulatory Visit | Attending: Internal Medicine | Admitting: Internal Medicine

## 2011-04-08 ENCOUNTER — Other Ambulatory Visit (HOSPITAL_BASED_OUTPATIENT_CLINIC_OR_DEPARTMENT_OTHER): Payer: Medicare Other | Admitting: Lab

## 2011-04-08 ENCOUNTER — Other Ambulatory Visit (HOSPITAL_COMMUNITY): Payer: Medicare Other

## 2011-04-08 ENCOUNTER — Other Ambulatory Visit: Payer: Self-pay | Admitting: Internal Medicine

## 2011-04-08 DIAGNOSIS — K5730 Diverticulosis of large intestine without perforation or abscess without bleeding: Secondary | ICD-10-CM | POA: Insufficient documentation

## 2011-04-08 DIAGNOSIS — C3410 Malignant neoplasm of upper lobe, unspecified bronchus or lung: Secondary | ICD-10-CM

## 2011-04-08 DIAGNOSIS — Z90710 Acquired absence of both cervix and uterus: Secondary | ICD-10-CM | POA: Insufficient documentation

## 2011-04-08 DIAGNOSIS — R911 Solitary pulmonary nodule: Secondary | ICD-10-CM | POA: Insufficient documentation

## 2011-04-08 DIAGNOSIS — J984 Other disorders of lung: Secondary | ICD-10-CM | POA: Insufficient documentation

## 2011-04-08 DIAGNOSIS — R0602 Shortness of breath: Secondary | ICD-10-CM | POA: Insufficient documentation

## 2011-04-08 DIAGNOSIS — J438 Other emphysema: Secondary | ICD-10-CM | POA: Insufficient documentation

## 2011-04-08 DIAGNOSIS — C3490 Malignant neoplasm of unspecified part of unspecified bronchus or lung: Secondary | ICD-10-CM | POA: Insufficient documentation

## 2011-04-08 LAB — COMPREHENSIVE METABOLIC PANEL
Albumin: 3.7 g/dL (ref 3.5–5.2)
Alkaline Phosphatase: 91 U/L (ref 39–117)
BUN: 36 mg/dL — ABNORMAL HIGH (ref 6–23)
CO2: 31 mEq/L (ref 19–32)
Glucose, Bld: 97 mg/dL (ref 70–99)
Potassium: 4.7 mEq/L (ref 3.5–5.3)
Total Protein: 7.7 g/dL (ref 6.0–8.3)

## 2011-04-08 LAB — CBC WITH DIFFERENTIAL/PLATELET
Basophils Absolute: 0 10*3/uL (ref 0.0–0.1)
Eosinophils Absolute: 0.5 10*3/uL (ref 0.0–0.5)
HGB: 14.3 g/dL (ref 11.6–15.9)
LYMPH%: 14.6 % (ref 14.0–49.7)
MCV: 85.8 fL (ref 79.5–101.0)
MONO#: 0.5 10*3/uL (ref 0.1–0.9)
MONO%: 8.6 % (ref 0.0–14.0)
NEUT#: 4 10*3/uL (ref 1.5–6.5)
Platelets: 206 10*3/uL (ref 145–400)
RDW: 13.1 % (ref 11.2–14.5)
WBC: 5.9 10*3/uL (ref 3.9–10.3)

## 2011-04-12 ENCOUNTER — Ambulatory Visit (INDEPENDENT_AMBULATORY_CARE_PROVIDER_SITE_OTHER): Payer: Medicare Other | Admitting: Cardiology

## 2011-04-24 ENCOUNTER — Encounter: Payer: Self-pay | Admitting: Cardiology

## 2011-04-24 ENCOUNTER — Ambulatory Visit (INDEPENDENT_AMBULATORY_CARE_PROVIDER_SITE_OTHER): Payer: Medicare Other | Admitting: Cardiology

## 2011-04-24 VITALS — BP 144/70 | HR 61 | Ht 68.0 in | Wt 169.0 lb

## 2011-04-24 DIAGNOSIS — C3490 Malignant neoplasm of unspecified part of unspecified bronchus or lung: Secondary | ICD-10-CM

## 2011-04-24 DIAGNOSIS — I10 Essential (primary) hypertension: Secondary | ICD-10-CM

## 2011-04-24 DIAGNOSIS — R0602 Shortness of breath: Secondary | ICD-10-CM

## 2011-04-24 DIAGNOSIS — I2510 Atherosclerotic heart disease of native coronary artery without angina pectoris: Secondary | ICD-10-CM

## 2011-04-24 DIAGNOSIS — E785 Hyperlipidemia, unspecified: Secondary | ICD-10-CM

## 2011-04-24 NOTE — Progress Notes (Signed)
Patient ID: [name], female   DOB: May 25, 1934, 77 y.o.   MRN: [id number]

## 2011-04-24 NOTE — Assessment & Plan Note (Signed)
Slightly improved control

## 2011-04-24 NOTE — Patient Instructions (Signed)
Your physician recommends that you schedule a follow-up appointment in: 3-4 MONTHS  Your physician recommends that you continue on your current medications as directed. Please refer to the Current Medication list given to you today.

## 2011-04-24 NOTE — Progress Notes (Signed)
HPI:  She is doing well.  She was a bit short of breath, but is now better.  She uses indocin on occasions because of her gout, and we reviewed the implications of this. Has seen Dr. Jerolyn Center, and results of her CT scan are noted.  She is in great spirits. She will be seeing Dr. Margo Aye in [place] for her primary care.    Current Outpatient Prescriptions  Medication Sig Dispense Refill  . carvedilol (COREG) 12.5 MG tablet Take 12.5 mg by mouth. Take 2 tablets in the  Morning and 2 tablets in the afternoon       . clopidogrel (PLAVIX) 75 MG tablet Take 75 mg by mouth daily.        . furosemide (LASIX) 20 MG tablet Take 20 mg by mouth every other day as needed.        . pantoprazole (PROTONIX) 40 MG tablet Take 1 tablet (40 mg total) by mouth daily.  30 tablet  11    Allergies  Allergen Reactions  . Aspirin   . Cephalexin   . Ciprofloxacin     REACTION: weakness  . Iron   . Penicillins   . Statins   . Sulfonamide Derivatives     Past Medical History  Diagnosis Date  . Non-small cell carcinoma of lung     Stage IV  . Hypertension   . Hypercholesterolemia   . CKD (chronic kidney disease)   . Coronary artery disease     s/p PCI RCA  December 2006  . GERD (gastroesophageal reflux disease)   . Chronic anemia     Past Surgical History  Procedure Date  . Hematoma evacuation December 2006    groin  . Appendectomy   . Cataract extraction   . Hemorroidectomy   . Laminectomy     Family History  Problem Relation Age of Onset  . Heart failure Mother   . Lung cancer Sister   . Lung cancer Brother   . Stomach cancer Brother     History   Social History  . Marital Status: Widowed    Spouse Name: N/A    Number of Children: N/A  . Years of Education: N/A   Occupational History  . Not on file.   Social History Main Topics  . Smoking status: Former Smoker    Types: Cigarettes  . Smokeless tobacco: Not on file  . Alcohol Use: No  . Drug Use:   . Sexually Active:     Other Topics Concern  . Not on file   Social History Narrative  . No narrative on file    ROS: Please see the HPI.  All other systems reviewed and negative.  PHYSICAL EXAM:  BP 144/70  Pulse 61  Ht 5\' 8"  (1.727 m)  Wt 76.658 kg (169 lb)  BMI 25.70 kg/m2  General: Well developed, well nourished, in no acute distress. Head:  Normocephalic and atraumatic. Neck: no JVD Lungs: Clear to auscultation and percussion. Heart: Normal S1 and S2.  No murmur, rubs or gallops.  Pulses: Pulses normal in all 4 extremities. Extremities: No clubbing or cyanosis. No edema. Neurologic: Alert and oriented x 3.  EKG:  NSR with first degree AV block.  Cannot rule out anterior MI, age indeterminate. Non specific ST and T abnormality.   ASSESSMENT AND PLAN:

## 2011-04-24 NOTE — Assessment & Plan Note (Signed)
Appears to be remaining about the same.  Continue to monitor.  Has not seen Nephrology in some time.

## 2011-04-24 NOTE — Assessment & Plan Note (Signed)
Multifactorial in origin.  Mildly reduced LV, some diastolic abnormalities, combined with Stage 3 CKD.  Remains on diuretics.  Impact of Indocin reviewed, but she gets gouty spells.  Might consider other options---she will be seeing a primary MD soon.

## 2011-04-24 NOTE — Assessment & Plan Note (Signed)
Followed by Dr. Jerolyn Center.  Last CT looks encouraging.

## 2011-04-24 NOTE — Assessment & Plan Note (Signed)
Not currently on statins.

## 2011-05-07 ENCOUNTER — Telehealth: Payer: Self-pay | Admitting: Internal Medicine

## 2011-05-07 NOTE — Telephone Encounter (Signed)
Lvm advising appt 06/24/11 @ 9.30am r/s'd from 11/8 appt cx'd due to Epic.

## 2011-06-24 ENCOUNTER — Ambulatory Visit (HOSPITAL_BASED_OUTPATIENT_CLINIC_OR_DEPARTMENT_OTHER): Payer: Medicare Other | Admitting: Internal Medicine

## 2011-06-24 ENCOUNTER — Telehealth: Payer: Self-pay | Admitting: Internal Medicine

## 2011-06-24 ENCOUNTER — Ambulatory Visit: Payer: Medicare Other | Admitting: Internal Medicine

## 2011-06-24 VITALS — BP 203/84 | HR 63 | Temp 97.0°F | Wt 169.9 lb

## 2011-06-24 DIAGNOSIS — C3490 Malignant neoplasm of unspecified part of unspecified bronchus or lung: Secondary | ICD-10-CM

## 2011-06-24 DIAGNOSIS — Z9221 Personal history of antineoplastic chemotherapy: Secondary | ICD-10-CM

## 2011-06-24 NOTE — Telephone Encounter (Signed)
gv pt appt schedule for July including ct. Per MM ct to be w/o contrast and pt to follow up in 84mo.

## 2011-06-24 NOTE — Progress Notes (Signed)
[facility] OFFICE PROGRESS NOTE  PRINCIPAL DIAGNOSIS:  Stage IV non-small cell lung cancer diagnosed in March 2007.  PRIOR THERAPY:  Status post 6 cycles of systemic chemotherapy with carboplatin and docetaxel.  Last dose was given January 16, 2006.  CURRENT THERAPY:  Observation.  INTERVAL HISTORY: [name] 78 y.o. female returns to the clinic today for routine six-month followup visit accompanied by her daughter. The patient has no complaints today and she is feeling fine. She denied having any significant weight loss or night sweats. No chest pain or shortness of breath, no cough or hemoptysis. She has repeat CT scan of the chest performed recently and she is here today for evaluation and discussion of her scan results. She still working as a Surveyor, mining.  MEDICAL HISTORY: Past Medical History  Diagnosis Date  . Non-small cell carcinoma of lung     Stage IV  . Hypertension   . Hypercholesterolemia   . CKD (chronic kidney disease)   . Coronary artery disease     s/p PCI RCA  December 2006  . GERD (gastroesophageal reflux disease)   . Chronic anemia     ALLERGIES:  is allergic to aspirin; cephalexin; ciprofloxacin; iron; penicillins; statins; and sulfonamide derivatives.  MEDICATIONS:  Current Outpatient Prescriptions  Medication Sig Dispense Refill  . carvedilol (COREG) 12.5 MG tablet Take 12.5 mg by mouth. Take 2 tablets in the  Morning and 2 tablets in the afternoon       . clopidogrel (PLAVIX) 75 MG tablet Take 75 mg by mouth daily.        . pantoprazole (PROTONIX) 40 MG tablet Take 1 tablet (40 mg total) by mouth daily.  30 tablet  11  . furosemide (LASIX) 20 MG tablet Take 20 mg by mouth every other day as needed.          SURGICAL HISTORY:  Past Surgical History  Procedure Date  . Hematoma evacuation December 2006    groin  . Appendectomy   . Cataract extraction   . Hemorroidectomy   . Laminectomy     REVIEW OF SYSTEMS:  A  comprehensive review of systems was negative.   PHYSICAL EXAMINATION: General appearance: alert, cooperative and no distress Resp: clear to auscultation bilaterally Cardio: regular rate and rhythm, S1, S2 normal, no murmur, click, rub or gallop GI: soft, non-tender; bowel sounds normal; no masses,  no organomegaly Extremities: extremities normal, atraumatic, no cyanosis or edema  ECOG PERFORMANCE STATUS: 0 - Asymptomatic  Blood pressure 203/84, pulse 63, temperature 97 F (36.1 C), temperature source Oral, weight 169 lb 14.4 oz (77.066 kg).  LABORATORY DATA: Lab Results  Component Value Date   WBC 5.9 04/08/2011   HGB 14.3 04/08/2011   HCT 43.0 04/08/2011   MCV 85.8 04/08/2011   PLT 206 04/08/2011      Chemistry      Component Value Date/Time   NA 135 04/08/2011 1023   K 4.7 04/08/2011 1023   CL 97 04/08/2011 1023   CO2 31 04/08/2011 1023   BUN 36* 04/08/2011 1023   CREATININE 2.07* 04/08/2011 1023      Component Value Date/Time   CALCIUM 9.9 04/08/2011 1023   ALKPHOS 91 04/08/2011 1023   AST 17 04/08/2011 1023   ALT 10 04/08/2011 1023   BILITOT 0.4 04/08/2011 1023       RADIOGRAPHIC STUDIES: CT CHEST, ABDOMEN AND PELVIS WITHOUT CONTRAST  Technique: Multidetector CT imaging of the chest, abdomen and  pelvis  was performed following the standard protocol without IV  contrast.  Comparison: 10/09/2010  CT CHEST  Findings: 11 mm spiculated nodule in the peripheral left upper  lobe remains stable. A 10 mm spiculated nodule in the posterior  left lower lobe costophrenic sulcus is stable. A few other tiny  right lung nodules measuring 5 mm or less are also stable. Mild  pulmonary emphysema again noted. Mild bibasilar scarring is  stable.  No evidence of pleural or pericardial effusion. No evidence of  lymphadenopathy within the thorax.  IMPRESSION:  1. Stable bilateral pulmonary nodules, two largest in the left  lung measuring 11 mm and 10 mm in maximum diameter.  2. No evidence  of lymphadenopathy or pleural effusion.  CT ABDOMEN AND PELVIS  Findings: Both adrenal glands are normal in appearance. Bilateral  renal scarring again noted, however there is no evidence of  hydronephrosis. The other abdominal parenchymal organs are  unremarkable in appearance on this noncontrast day. Surgical clips  seen from prior cholecystectomy.  No soft tissue masses or lymphadenopathy identified within the  abdomen or pelvis. Previously noted less than 1 cm celiac axis  lymph node remains stable. Previous hysterectomy noted. Mild  diverticulosis is again seen, however there is no evidence of  diverticulitis or other inflammatory process. No evidence of  abnormal fluid collections. No suspicious bone lesions are  identified.  IMPRESSION:  Stable exam. No evidence of metastatic disease or other acute  findings within the abdomen or pelvis.  Original Report Authenticated By: Danae Orleans, M.D.   ASSESSMENT: This is a very pleasant 78 years old white female with metastatic non-small cell lung cancer diagnosed almost 6 years ago status post chemotherapy with carboplatin and docetaxel. She has been observation since that time was no evidence for disease progression. I discussed the scan results with the patient and her daughter.   PLAN:  I recommended for her continuous observation for now with repeat CT scan of the chest, abdomen and pelvis in 6 months. The patient was advised to contact me immediately if she has any concerning symptoms in the interval.   All questions were answered. The patient knows to call the clinic with any problems, questions or concerns. We can certainly see the patient much sooner if necessary.

## 2011-07-02 ENCOUNTER — Telehealth: Payer: Self-pay | Admitting: Cardiology

## 2011-07-02 NOTE — Telephone Encounter (Signed)
I left a detailed message on the pt's answering machine with the address and phone number of [employer] in [facility].  I will also mail the companies brochure to the pt's home.

## 2011-07-02 NOTE — Telephone Encounter (Signed)
New Msg: Pt calling wanting to speak to Lauren regarding pt compression stockings. Lauren referred pt to buy compression stockings in [place] however when pt went there they said pt needed measurements from MD for stockings. Pt said place in [place] recommended pt go to [facility] to get compression stockings. Pt wanted to know if Lauren knew the name and number of the place to get compression stockings in [facility]. Please return pt call to discuss further.

## 2011-07-26 ENCOUNTER — Telehealth: Payer: Self-pay | Admitting: Cardiology

## 2011-07-26 NOTE — Telephone Encounter (Signed)
I spoke with the [name] and she saw Dr Shirline Frees on 06/24/11 and her BP was 203/84.  The [name] then saw Dr Margo Aye for follow-up on BP and it was again in the 200/100 range per [name] and nothing was done.  The [name] said she restarted Amlodipine 5mg  about 2 weeks ago on her own due to elevated BP.  The [name] is scheduled to see Dr Margo Aye again in March.  The [name] is calling because she has developed more swelling in her legs since starting this medication. I made her aware that this is a side effect of Amlodipine.  The [name] does wear compression stockings. At this time the [name] is not monitoring her BP.  I instructed her to monitor BP over the weekend and bring readings into her scheduled appointment on 07/29/11 with Dr Riley Kill.  [name] agreed with plan.  The [name] also took Lasix on Wednesday due to SOB.  The [name] does have a Rx for Lasix as needed and I reviewed that information with her by phone. The [name] will come into the office on 07/29/11 to see Dr Riley Kill.

## 2011-07-26 NOTE — Telephone Encounter (Signed)
New msg Pt said she is taking amlodipine 5 mg and her feet and legs are swelling. Please call

## 2011-07-29 ENCOUNTER — Ambulatory Visit (INDEPENDENT_AMBULATORY_CARE_PROVIDER_SITE_OTHER): Payer: Medicare Other | Admitting: Cardiology

## 2011-07-29 ENCOUNTER — Encounter: Payer: Self-pay | Admitting: Cardiology

## 2011-07-29 DIAGNOSIS — I509 Heart failure, unspecified: Secondary | ICD-10-CM

## 2011-07-29 DIAGNOSIS — I5032 Chronic diastolic (congestive) heart failure: Secondary | ICD-10-CM

## 2011-07-29 DIAGNOSIS — R0602 Shortness of breath: Secondary | ICD-10-CM

## 2011-07-29 DIAGNOSIS — R609 Edema, unspecified: Secondary | ICD-10-CM

## 2011-07-29 DIAGNOSIS — C3490 Malignant neoplasm of unspecified part of unspecified bronchus or lung: Secondary | ICD-10-CM

## 2011-07-29 DIAGNOSIS — N183 Chronic kidney disease, stage 3 unspecified: Secondary | ICD-10-CM

## 2011-07-29 DIAGNOSIS — I2510 Atherosclerotic heart disease of native coronary artery without angina pectoris: Secondary | ICD-10-CM

## 2011-07-29 LAB — BASIC METABOLIC PANEL
BUN: 29 mg/dL — ABNORMAL HIGH (ref 6–23)
CO2: 29 mEq/L (ref 19–32)
Chloride: 104 mEq/L (ref 96–112)
Creatinine, Ser: 1.9 mg/dL — ABNORMAL HIGH (ref 0.4–1.2)
Glucose, Bld: 96 mg/dL (ref 70–99)
Potassium: 4.3 mEq/L (ref 3.5–5.1)

## 2011-07-29 MED ORDER — FUROSEMIDE 20 MG PO TABS: 20.0000 mg | ORAL_TABLET | Freq: Every day | ORAL | Status: DC

## 2011-07-29 NOTE — Assessment & Plan Note (Signed)
See "edema"

## 2011-07-29 NOTE — Assessment & Plan Note (Signed)
Could always be an ischemic equivalent, but no chest pain at present, and doubt.

## 2011-07-29 NOTE — Progress Notes (Signed)
   HPI:  She is perhaps slightly more short of breath, and notices it with activity.  Nonetheless she was able to get on the treadmill yesterday.  She does get gout when we increase her diuretics.  No chest pain or progressive symptoms otherwise.  She also has perhaps slightly more swelling in the feet.    Current Outpatient Prescriptions  Medication Sig Dispense Refill  . carvedilol (COREG) 12.5 MG tablet Take 12.5 mg by mouth. Take 2 tablets in the  Morning and 2 tablets in the afternoon       . clopidogrel (PLAVIX) 75 MG tablet Take 75 mg by mouth daily.        . furosemide (LASIX) 20 MG tablet Take 20 mg by mouth every other day as needed.        . pantoprazole (PROTONIX) 40 MG tablet Take 1 tablet (40 mg total) by mouth daily.  30 tablet  11    Allergies  Allergen Reactions  . Aspirin   . Cephalexin   . Ciprofloxacin     REACTION: weakness  . Iron   . Penicillins   . Statins   . Sulfonamide Derivatives     Past Medical History  Diagnosis Date  . Non-small cell carcinoma of lung     Stage IV  . Hypertension   . Hypercholesterolemia   . CKD (chronic kidney disease)   . Coronary artery disease     s/p PCI RCA  December 2006  . GERD (gastroesophageal reflux disease)   . Chronic anemia     Past Surgical History  Procedure Date  . Hematoma evacuation December 2006    groin  . Appendectomy   . Cataract extraction   . Hemorroidectomy   . Laminectomy     Family History  Problem Relation Age of Onset  . Heart failure Mother   . Lung cancer Sister   . Lung cancer Brother   . Stomach cancer Brother     History   Social History  . Marital Status: Widowed    Spouse Name: N/A    Number of Children: N/A  . Years of Education: N/A   Occupational History  . Not on file.   Social History Main Topics  . Smoking status: Former Smoker    Types: Cigarettes  . Smokeless tobacco: Not on file  . Alcohol Use: No  . Drug Use:   . Sexually Active:    Other Topics  Concern  . Not on file   Social History Narrative  . No narrative on file    ROS: Please see the HPI.  All other systems reviewed and negative.  PHYSICAL EXAM:  BP 158/86  Pulse 59  Ht 5' 8.5" (1.74 m)  Wt 169 lb 6.4 oz (76.839 kg)  BMI 25.38 kg/m2  General: Well developed, well nourished, in no acute distress. Head:  Normocephalic and atraumatic. Neck: mild JVP elevation.   Lungs: Clear to auscultation and percussion. Heart: Normal S1 and S2.  No murmur, rubs or gallops.  Abdomen:  Normal bowel sounds; soft; non tender; no organomegaly Pulses: Pulses normal in all 4 extremities. Extremities: No clubbing or cyanosis. 1-2 plus lower extremity edema.   Neurologic: Alert and oriented x 3.  EKG: NSR.  Delay in R wave progression.  No acute changes.  Non specific T flattening.    ASSESSMENT AND PLAN:

## 2011-07-29 NOTE — Patient Instructions (Addendum)
Your physician has recommended you make the following change in your medication: START Furosemide 20mg  take one by mouth daily  Your physician recommends that you have lab work today: [facility]  Your physician recommends that you schedule a follow-up appointment in: 2 MONTHS

## 2011-07-29 NOTE — Assessment & Plan Note (Signed)
Probable mild increase in volume with some swelling in the feet.  Will increase furosemide to once daily, and reassess.  She is careful about her salt.  Will check BMET at present time.  Uses Ms. Dash.  Will see back in two months.  Will need to watch our for gout.

## 2011-07-29 NOTE — Assessment & Plan Note (Signed)
Will prescribe some compression stockings.

## 2011-07-29 NOTE — Assessment & Plan Note (Signed)
Following with Dr. Jerolyn Center.

## 2011-07-29 NOTE — Assessment & Plan Note (Signed)
Arrange for follow up at [facility].

## 2011-09-17 ENCOUNTER — Other Ambulatory Visit: Payer: Self-pay | Admitting: Cardiology

## 2011-09-26 ENCOUNTER — Ambulatory Visit: Payer: Medicare Other | Admitting: Cardiology

## 2011-10-01 ENCOUNTER — Ambulatory Visit (INDEPENDENT_AMBULATORY_CARE_PROVIDER_SITE_OTHER): Payer: Medicare Other | Admitting: Cardiology

## 2011-10-01 ENCOUNTER — Encounter: Payer: Self-pay | Admitting: Cardiology

## 2011-10-01 VITALS — BP 130/70 | HR 60 | Resp 18 | Ht 68.0 in | Wt 166.0 lb

## 2011-10-01 DIAGNOSIS — I2510 Atherosclerotic heart disease of native coronary artery without angina pectoris: Secondary | ICD-10-CM

## 2011-10-01 DIAGNOSIS — R609 Edema, unspecified: Secondary | ICD-10-CM

## 2011-10-01 DIAGNOSIS — E785 Hyperlipidemia, unspecified: Secondary | ICD-10-CM

## 2011-10-01 DIAGNOSIS — I10 Essential (primary) hypertension: Secondary | ICD-10-CM

## 2011-10-01 DIAGNOSIS — N183 Chronic kidney disease, stage 3 unspecified: Secondary | ICD-10-CM

## 2011-10-01 NOTE — Assessment & Plan Note (Signed)
None at present  

## 2011-10-01 NOTE — Assessment & Plan Note (Signed)
BP today ok.  [place] Spaniel it was higher.  Stressed importance.  Could add nitrates if needed in combo with hydralazine.

## 2011-10-01 NOTE — Assessment & Plan Note (Signed)
Followed by Dr. Kathrene Bongo.  Will repeat BMET in two months.

## 2011-10-01 NOTE — Assessment & Plan Note (Signed)
Not currently on statins.  

## 2011-10-01 NOTE — Patient Instructions (Signed)
Your physician recommends that you schedule a follow-up appointment in: 2 MONTHS with Dr Stuckey  Your physician recommends that you continue on your current medications as directed. Please refer to the Current Medication list given to you today.  

## 2011-10-01 NOTE — Progress Notes (Signed)
HPI:  Patient in for follow up.  She has seen Dr. Kathrene Bongo.  She has also seen Dr. Margo Aye and had a UTI.  She had a problem with Macrobid, broke out in a rash, and is now scheduled to see [facility] in the next few weeks.  She is doing well otherwise.  Her BPs were higher but today is better.  No chest pain.  Feels good.    Current Outpatient Prescriptions  Medication Sig Dispense Refill  . carvedilol (COREG) 12.5 MG tablet TAKE TWO TABLETS BY MOUTH TWICE DAILY  360 tablet  3  . clopidogrel (PLAVIX) 75 MG tablet Take 75 mg by mouth daily.        . pantoprazole (PROTONIX) 40 MG tablet TAKE ONE TABLET BY MOUTH EVERY DAY  30 tablet  11  . carvedilol (COREG) 25 MG tablet Take 12.5 mg by mouth. Take 2 tablets in the morning and 2 tablets at night       . furosemide (LASIX) 20 MG tablet Take 1 tablet (20 mg total) by mouth daily.  30 tablet  6  . hydrALAZINE (APRESOLINE) 25 MG tablet Take 25 mg by mouth 2 (two) times daily.         Allergies  Allergen Reactions  . Aspirin   . Cephalexin   . Ciprofloxacin     REACTION: weakness  . Iron   . Penicillins   . Statins   . Sulfonamide Derivatives     Past Medical History  Diagnosis Date  . Non-small cell carcinoma of lung     Stage IV  . Hypertension   . Hypercholesterolemia   . CKD (chronic kidney disease)   . Coronary artery disease     s/p PCI RCA  December 2006  . GERD (gastroesophageal reflux disease)   . Chronic anemia     Past Surgical History  Procedure Date  . Hematoma evacuation December 2006    groin  . Appendectomy   . Cataract extraction   . Hemorroidectomy   . Laminectomy     Family History  Problem Relation Age of Onset  . Heart failure Mother   . Lung cancer Sister   . Lung cancer Brother   . Stomach cancer Brother     History   Social History  . Marital Status: Widowed    Spouse Name: N/A    Number of Children: N/A  . Years of Education: N/A   Occupational History  . Not on file.    Social History Main Topics  . Smoking status: Former Smoker    Types: Cigarettes  . Smokeless tobacco: Not on file  . Alcohol Use: No  . Drug Use:   . Sexually Active:    Other Topics Concern  . Not on file   Social History Narrative  . No narrative on file    ROS: Please see the HPI.  All other systems reviewed and negative.  PHYSICAL EXAM:  BP 130/70  Pulse 60  Resp 18  Ht 5\' 8"  (1.727 m)  Wt 166 lb (75.297 kg)  BMI 25.24 kg/m2  SpO2 98%  General: Well developed, well nourished, in no acute distress. Head:  Normocephalic and atraumatic. Neck: no JVD Lungs: Clear to auscultation and percussion. Heart: Normal S1 and S2.  No murmur, rubs or gallops. Minimal apical murmur.  Minimal aortic diastolic murmur.  Abdomen:  Normal bowel sounds; soft; non tender; no organomegaly Pulses: Pulses normal in all 4 extremities. Extremities: No clubbing or cyanosis.  No edema. Neurologic: Alert and oriented x 3.  EKG:  Not done today  ASSESSMENT AND PLAN:

## 2011-10-01 NOTE — Assessment & Plan Note (Signed)
No current chest pain

## 2011-12-03 ENCOUNTER — Ambulatory Visit: Payer: Medicare Other | Admitting: Cardiology

## 2011-12-22 ENCOUNTER — Other Ambulatory Visit: Payer: Self-pay | Admitting: Cardiology

## 2011-12-23 ENCOUNTER — Other Ambulatory Visit (HOSPITAL_COMMUNITY): Payer: Medicare Other

## 2011-12-23 ENCOUNTER — Ambulatory Visit (HOSPITAL_COMMUNITY)
Admission: RE | Admit: 2011-12-23 | Discharge: 2011-12-23 | Disposition: A | Discharge status: Home / Self Care | Payer: Medicare Other | Source: Ambulatory Visit | Attending: Internal Medicine | Admitting: Internal Medicine

## 2011-12-23 ENCOUNTER — Other Ambulatory Visit (HOSPITAL_BASED_OUTPATIENT_CLINIC_OR_DEPARTMENT_OTHER): Payer: Medicare Other

## 2011-12-23 ENCOUNTER — Ambulatory Visit: Payer: Medicare Other | Admitting: Cardiology

## 2011-12-23 ENCOUNTER — Other Ambulatory Visit: Payer: Self-pay | Admitting: Internal Medicine

## 2011-12-23 DIAGNOSIS — C3490 Malignant neoplasm of unspecified part of unspecified bronchus or lung: Secondary | ICD-10-CM

## 2011-12-23 DIAGNOSIS — I7409 Other arterial embolism and thrombosis of abdominal aorta: Secondary | ICD-10-CM | POA: Insufficient documentation

## 2011-12-23 DIAGNOSIS — M47817 Spondylosis without myelopathy or radiculopathy, lumbosacral region: Secondary | ICD-10-CM | POA: Insufficient documentation

## 2011-12-23 DIAGNOSIS — N289 Disorder of kidney and ureter, unspecified: Secondary | ICD-10-CM | POA: Insufficient documentation

## 2011-12-23 LAB — CMP (CANCER CENTER ONLY)
ALT(SGPT): 16 U/L (ref 10–47)
AST: 16 U/L (ref 11–38)
CO2: 27 mEq/L (ref 18–33)
Calcium: 9 mg/dL (ref 8.0–10.3)
Chloride: 101 mEq/L (ref 98–108)
Creat: 2 mg/dl — ABNORMAL HIGH (ref 0.6–1.2)
Sodium: 143 mEq/L (ref 128–145)
Total Protein: 7.1 g/dL (ref 6.4–8.1)

## 2011-12-23 LAB — CBC WITH DIFFERENTIAL/PLATELET
BASO%: 0.4 % (ref 0.0–2.0)
EOS%: 9.3 % — ABNORMAL HIGH (ref 0.0–7.0)
MCH: 28.8 pg (ref 25.1–34.0)
MCHC: 33.5 g/dL (ref 31.5–36.0)
MONO#: 0.5 10*3/uL (ref 0.1–0.9)
NEUT%: 64 % (ref 38.4–76.8)
RBC: 4.68 10*6/uL (ref 3.70–5.45)
RDW: 13.4 % (ref 11.2–14.5)
WBC: 5.1 10*3/uL (ref 3.9–10.3)
lymph#: 0.9 10*3/uL (ref 0.9–3.3)

## 2011-12-25 ENCOUNTER — Ambulatory Visit: Payer: Medicare Other | Admitting: Cardiology

## 2011-12-26 ENCOUNTER — Telehealth: Payer: Self-pay | Admitting: Internal Medicine

## 2011-12-26 ENCOUNTER — Ambulatory Visit: Payer: Medicare Other | Admitting: Internal Medicine

## 2011-12-26 ENCOUNTER — Ambulatory Visit (HOSPITAL_BASED_OUTPATIENT_CLINIC_OR_DEPARTMENT_OTHER): Payer: Medicare Other | Admitting: Internal Medicine

## 2011-12-26 VITALS — BP 163/74 | HR 68 | Temp 96.9°F | Wt 163.3 lb

## 2011-12-26 DIAGNOSIS — C3490 Malignant neoplasm of unspecified part of unspecified bronchus or lung: Secondary | ICD-10-CM

## 2011-12-26 NOTE — Progress Notes (Signed)
[facility] [phone]   [phone]  OFFICE PROGRESS NOTE  HALL,ZACK, MD [address] [place] [address]  PRINCIPAL DIAGNOSIS: Stage IV non-small cell lung cancer diagnosed in March 2007.   PRIOR THERAPY: Status post 6 cycles of systemic chemotherapy with carboplatin and docetaxel. Last dose was given January 16, 2006.   CURRENT THERAPY: Observation.  INTERVAL HISTORY: [name] 78 y.o. female returns to the clinic today for routine six-month followup visit accompanied by her daughter. The patient is feeling fine today with no specific complaints. She has some issues with urinary tract infection recently and was treated at the [facility]. The patient denied having any significant weight loss or night sweats. She has no chest pain or shortness breath, no cough or hemoptysis. She has repeat CT scan of the chest, abdomen and pelvis performed recently and she is here today for evaluation and discussion of her scan results.  MEDICAL HISTORY: Past Medical History  Diagnosis Date  . Non-small cell carcinoma of lung     Stage IV  . Hypertension   . Hypercholesterolemia   . CKD (chronic kidney disease)   . Coronary artery disease     s/p PCI RCA  December 2006  . GERD (gastroesophageal reflux disease)   . Chronic anemia     ALLERGIES:  is allergic to aspirin; cephalexin; ciprofloxacin; iron; penicillins; statins; and sulfonamide derivatives.  MEDICATIONS:  Current Outpatient Prescriptions  Medication Sig Dispense Refill  . carvedilol (COREG) 12.5 MG tablet TAKE TWO TABLETS BY MOUTH TWICE DAILY  360 tablet  3  . hydrALAZINE (APRESOLINE) 25 MG tablet Take 25 mg by mouth 2 (two) times daily.       . pantoprazole (PROTONIX) 40 MG tablet TAKE ONE TABLET BY MOUTH EVERY DAY  30 tablet  11  . PLAVIX 75 MG tablet TAKE ONE TABLET BY MOUTH EVERY DAY  30 each  12  . DISCONTD: carvedilol (COREG) 25 MG tablet Take 12.5 mg by  mouth. Take 2 tablets in the morning and 2 tablets at night         SURGICAL HISTORY:  Past Surgical History  Procedure Date  . Hematoma evacuation December 2006    groin  . Appendectomy   . Cataract extraction   . Hemorroidectomy   . Laminectomy     REVIEW OF SYSTEMS:  A comprehensive review of systems was negative.   PHYSICAL EXAMINATION: General appearance: alert, cooperative and no distress Head: Normocephalic, without obvious abnormality, atraumatic Neck: no adenopathy Lymph nodes: Cervical, supraclavicular, and axillary nodes normal. Resp: clear to auscultation bilaterally Cardio: regular rate and rhythm, S1, S2 normal, no murmur, click, rub or gallop GI: soft, non-tender; bowel sounds normal; no masses,  no organomegaly Extremities: extremities normal, atraumatic, no cyanosis or edema Neurologic: Alert and oriented X 3, normal strength and tone. Normal symmetric reflexes. Normal coordination and gait  ECOG PERFORMANCE STATUS: 0 - Asymptomatic  Blood pressure 163/74, pulse 68, temperature 96.9 F (36.1 C), temperature source Oral, weight 163 lb 4.8 oz (74.072 kg).  LABORATORY DATA: Lab Results  Component Value Date   WBC 5.1 12/23/2011   HGB 13.5 12/23/2011   HCT 40.3 12/23/2011   MCV 86.0 12/23/2011   PLT 176 12/23/2011      Chemistry      Component Value Date/Time   NA 143 12/23/2011 0830   NA 141 07/29/2011 1026   K 4.7 12/23/2011 0830   K 4.3  07/29/2011 1026   CL 101 12/23/2011 0830   CL 104 07/29/2011 1026   CO2 27 12/23/2011 0830   CO2 29 07/29/2011 1026   BUN 25* 12/23/2011 0830   BUN 29* 07/29/2011 1026   CREATININE 2.0* 12/23/2011 0830   CREATININE 1.9* 07/29/2011 1026      Component Value Date/Time   CALCIUM 9.0 12/23/2011 0830   CALCIUM 9.2 07/29/2011 1026   ALKPHOS 76 12/23/2011 0830   ALKPHOS 91 04/08/2011 1023   AST 16 12/23/2011 0830   AST 17 04/08/2011 1023   ALT 10 04/08/2011 1023   BILITOT 0.60 12/23/2011 0830   BILITOT 0.4 04/08/2011 1023        RADIOGRAPHIC STUDIES: Ct Chest Wo Contrast  12/23/2011  *RADIOLOGY REPORT*  Clinical Data:  Lung cancer.  CT CHEST, ABDOMEN AND PELVIS WITHOUT CONTRAST  Technique:  Multidetector CT imaging of the chest, abdomen and pelvis was performed following the standard protocol without IV contrast.  Comparison:  Multiple exams, including 04/08/2011   CT CHEST  Findings:  Stable aortic and coronary artery atherosclerotic calcification noted.  Borderline pericardial thickening likewise appears stable.  Although difficult to separate from adjacent vasculature, I suspect a 1.2 cm right hilar node - this region is stable.  Emphysema noted.  Left upper lobe nodule measures 1.2 x 0.8 cm, stable from 04/08/2011 and increased in size compared to 06/06/2005.  Left lower lobe nodule measures 0.9 x 0.7 cm on image 52 of series 4, stable.  Unchanged scarring along the lingula and in the right lower lobe noted.  Several additional tiny nodules measuring with that are less than 5 mm in diameter present bilaterally and appears stable.  IMPRESSION:  1.  Unchanged appearance of the chest compared the most recent prior exam, with mild right hilar adenopathy, two dominant left- sided pulmonary nodules, and several tiny pulmonary nodules. Although the appearance is not changed from 04/08/2011, the dominant left upper lobe nodule has enlarged compared to the back to the exam from 2007, and the left lower lobe nodule was not visible on that exam (although it may have been obscured by pleural effusion and volume loss on the 2007 exam). Accordingly, low grade malignancy is not excluded. 2.  Stable atherosclerosis.   CT ABDOMEN AND PELVIS  Findings:  Mildly prominent lateral segment left hepatic lobe noted without overt liver nodularity.  The spleen, pancreas, and adrenal glands appear unremarkable.  Scarring noted medially in the left kidney.  The right kidney upper pole 1.6 cm in diameter hypodense lesion has fluid density and probably  represents a cyst, although appears to have been increasing in size over the last several years, measuring about 0.8 cm back in 2009.  There is thickening along the posterior wall of the right collecting system with a punctate calcification as shown on image 65 of series 2.  Chronic calcification along mural thrombus in the left side of the infrarenal abdominal aorta noted. No pathologic retroperitoneal or porta hepatis adenopathy is identified.  The pancreas and spleen have unremarkable noncontrast CT appearance.  There is a small diverticulum of the descending duodenum and the periampullary position.  Postoperative findings along the cecum are probably from prior appendectomy.  Mild sigmoid diverticulosis noted without overt diverticulitis.  No pathologic pelvic adenopathy is identified.  Urinary bladder appears unremarkable.  Stable lumbar spondylosis and degenerative disc disease noted.  IMPRESSION:  1.  Mildly prominent lateral segment left hepatic lobe but without nodularity liver contour to further suggest cirrhosis. 2.  Enlarging hypodense lesion in the right kidney upper pole, at 1.6 cm.  Although this has fluid density, has enlarged over last several years.  Correlate of sonography may be warranted to exclude a solid lesion. 3.  There is also apparent mild thickening along the posterior wall of the right renal collecting system with a punctate calcification. I cannot completely exclude the possibility of transitional cell carcinoma in this vicinity, as on image 65 of series 2.  Presumably the patient is unable to have IV contrast based on the renal insufficiency, and of the dedicated renal protocol MRI with contrast is contraindicated, the possibility of a retrograde contrast examination would be considered for further evaluation of this finding.  Original Report Authenticated By: Dellia Cloud, M.D.    ASSESSMENT: This is a very pleasant 78 years old white female with history of metastatic  non-small cell lung cancer status post 6 cycles of systemic chemotherapy with carboplatin and docetaxel completed in August of 2007 with complete response. She has been observation since that time was no evidence for disease recurrence.  PLAN: I discussed the scan results with the patient and her daughter today. I recommended for her continuous observation with repeat CT scan of the chest, abdomen and pelvis without contrast in 6 months. The patient would come back for followup visit at that time.  She was advised to call me immediately if she has any concerning symptoms in the interval.  All questions were answered. The patient knows to call the clinic with any problems, questions or concerns. We can certainly see the patient much sooner if necessary.

## 2011-12-26 NOTE — Telephone Encounter (Signed)
Gave pt appt for January 2014 lab and Ct then MD, gave pt oral contrast . Npo 4 hours prior to CT

## 2012-01-31 ENCOUNTER — Ambulatory Visit: Payer: Medicare Other | Admitting: Cardiology

## 2012-02-12 ENCOUNTER — Ambulatory Visit: Payer: Medicare Other | Admitting: Cardiology

## 2012-03-11 ENCOUNTER — Ambulatory Visit (INDEPENDENT_AMBULATORY_CARE_PROVIDER_SITE_OTHER): Payer: Medicare Other | Admitting: Cardiology

## 2012-03-11 ENCOUNTER — Encounter: Payer: Self-pay | Admitting: Cardiology

## 2012-03-11 VITALS — BP 157/82 | HR 65 | Ht 68.0 in | Wt 163.0 lb

## 2012-03-11 DIAGNOSIS — R0602 Shortness of breath: Secondary | ICD-10-CM

## 2012-03-11 DIAGNOSIS — I10 Essential (primary) hypertension: Secondary | ICD-10-CM

## 2012-03-11 DIAGNOSIS — I2510 Atherosclerotic heart disease of native coronary artery without angina pectoris: Secondary | ICD-10-CM

## 2012-03-11 LAB — BASIC METABOLIC PANEL
Calcium: 8.7 mg/dL (ref 8.4–10.5)
Chloride: 108 mEq/L (ref 96–112)
Creatinine, Ser: 2.5 mg/dL — ABNORMAL HIGH (ref 0.4–1.2)
GFR: 20.24 mL/min — ABNORMAL LOW (ref >60.00)

## 2012-03-11 LAB — BRAIN NATRIURETIC PEPTIDE: Pro B Natriuretic peptide (BNP): 279 pg/mL — ABNORMAL HIGH (ref 0.0–100.0)

## 2012-03-11 NOTE — Assessment & Plan Note (Signed)
She's been followed by [employer] [facility].

## 2012-03-11 NOTE — Assessment & Plan Note (Signed)
Overall the patient is not having any ongoing chest pain. I think she is likely stable from a coronary standpoint.

## 2012-03-11 NOTE — Assessment & Plan Note (Signed)
The patient has multiple potential etiologies for her dyspnea.  She does have some reduction in left ventricular function, a bit hypertensive cardiomyopathy, and mild MR and AR. I think would be most appropriate to get a BNP, and also to repeat a 2-D echo which is done in nearly 3 years ago. She does have a mitral murmur, but it is really not prominent.  She is agreeable to this plan.

## 2012-03-11 NOTE — Progress Notes (Signed)
   HPI:  Is in for followup visit. She is doing overall really quite well. She saw her nephrologist, and she was pleased with how she is doing. The only thing she does notice some modest shortness of breath with exertion. The patient does have known left ventricular dysfunction with some mild mitral regurgitation as well as aortic regurgitation.  Overall however, she feels like she is doing pretty well. She's not had chest pain, and not had significant cough.  Current Outpatient Prescriptions  Medication Sig Dispense Refill  . carvedilol (COREG) 12.5 MG tablet TAKE TWO TABLETS BY MOUTH TWICE DAILY  360 tablet  3  . hydrALAZINE (APRESOLINE) 25 MG tablet Take 25 mg by mouth 2 (two) times daily.       . pantoprazole (PROTONIX) 40 MG tablet TAKE ONE TABLET BY MOUTH EVERY DAY  30 tablet  11  . PLAVIX 75 MG tablet TAKE ONE TABLET BY MOUTH EVERY DAY  30 each  12  . traMADol (ULTRAM) 50 MG tablet prn        Allergies  Allergen Reactions  . Aspirin   . Cephalexin   . Ciprofloxacin     REACTION: weakness  . Iron   . Penicillins   . Statins   . Sulfonamide Derivatives     Past Medical History  Diagnosis Date  . Non-small cell carcinoma of lung     Stage IV  . Hypertension   . Hypercholesterolemia   . CKD (chronic kidney disease)   . Coronary artery disease     s/p PCI RCA  December 2006  . GERD (gastroesophageal reflux disease)   . Chronic anemia     Past Surgical History  Procedure Date  . Hematoma evacuation December 2006    groin  . Appendectomy   . Cataract extraction   . Hemorroidectomy   . Laminectomy     Family History  Problem Relation Age of Onset  . Heart failure Mother   . Lung cancer Sister   . Lung cancer Brother   . Stomach cancer Brother     History   Social History  . Marital Status: Widowed    Spouse Name: N/A    Number of Children: N/A  . Years of Education: N/A   Occupational History  . Not on file.   Social History Main Topics  . Smoking  status: Former Smoker    Types: Cigarettes  . Smokeless tobacco: Not on file  . Alcohol Use: No  . Drug Use:   . Sexually Active:    Other Topics Concern  . Not on file   Social History Narrative  . No narrative on file    ROS: Please see the HPI.  All other systems reviewed and negative.  PHYSICAL EXAM:  BP 157/82  Pulse 65  Ht 5\' 8"  (1.727 m)  Wt 163 lb (73.936 kg)  BMI 24.78 kg/m2  General: Well developed, well nourished, in no acute distress. Head:  Normocephalic and atraumatic. Neck: no JVD Lungs: Clear to auscultation and percussion. Heart: Normal S1 and S2.  Soft apical murmur.   Pulses: Pulses normal in all 4 extremities. Extremities: No clubbing or cyanosis. No edema. Neurologic: Alert and oriented x 3.  EKG:  NSR.  Inferior MI, age indeterminate.    ASSESSMENT AND PLAN:

## 2012-03-11 NOTE — Assessment & Plan Note (Signed)
Her blood pressure is higher today. However, she says that she checks it pretty regularly at [employer], and he generally runs in the range of 120/70

## 2012-03-11 NOTE — Patient Instructions (Addendum)
Your physician has requested that you have an echocardiogram. Echocardiography is a painless test that uses sound waves to create images of your heart. It provides your doctor with information about the size and shape of your heart and how well your heart's chambers and valves are working. This procedure takes approximately one hour. There are no restrictions for this procedure.  Your physician recommends that you have lab work today: BMP and BNP  Your physician recommends that you continue on your current medications as directed. Please refer to the Current Medication list given to you today.  Your physician recommends that you schedule a follow-up appointment in: February 2014

## 2012-03-12 ENCOUNTER — Telehealth: Payer: Self-pay | Admitting: Cardiology

## 2012-03-12 DIAGNOSIS — N189 Chronic kidney disease, unspecified: Secondary | ICD-10-CM

## 2012-03-12 NOTE — Telephone Encounter (Signed)
New Problem:    Patient called in returning you call about her lab work.  Patient only wished to hear the results from you.  Please call back, patient is aware that you are out of the office when this message was taken.

## 2012-03-13 NOTE — Telephone Encounter (Signed)
Left message to call back  

## 2012-03-13 NOTE — Telephone Encounter (Signed)
Pt aware of results by phone.  The pt would like to have her BMP rechecked on 04/28/12 when she is is the office for Echo appointment.

## 2012-04-28 ENCOUNTER — Other Ambulatory Visit (HOSPITAL_COMMUNITY): Payer: Medicare Other

## 2012-04-28 ENCOUNTER — Other Ambulatory Visit: Payer: Medicare Other

## 2012-05-01 ENCOUNTER — Other Ambulatory Visit (INDEPENDENT_AMBULATORY_CARE_PROVIDER_SITE_OTHER): Payer: Medicare Other

## 2012-05-01 ENCOUNTER — Ambulatory Visit (HOSPITAL_COMMUNITY): Payer: Medicare Other | Attending: Cardiovascular Disease

## 2012-05-01 DIAGNOSIS — N189 Chronic kidney disease, unspecified: Secondary | ICD-10-CM | POA: Insufficient documentation

## 2012-05-01 DIAGNOSIS — E785 Hyperlipidemia, unspecified: Secondary | ICD-10-CM | POA: Insufficient documentation

## 2012-05-01 DIAGNOSIS — I129 Hypertensive chronic kidney disease with stage 1 through stage 4 chronic kidney disease, or unspecified chronic kidney disease: Secondary | ICD-10-CM | POA: Insufficient documentation

## 2012-05-01 DIAGNOSIS — R0602 Shortness of breath: Secondary | ICD-10-CM

## 2012-05-01 DIAGNOSIS — R0609 Other forms of dyspnea: Secondary | ICD-10-CM | POA: Insufficient documentation

## 2012-05-01 DIAGNOSIS — I080 Rheumatic disorders of both mitral and aortic valves: Secondary | ICD-10-CM | POA: Insufficient documentation

## 2012-05-01 DIAGNOSIS — R0989 Other specified symptoms and signs involving the circulatory and respiratory systems: Secondary | ICD-10-CM | POA: Insufficient documentation

## 2012-05-01 DIAGNOSIS — I079 Rheumatic tricuspid valve disease, unspecified: Secondary | ICD-10-CM | POA: Insufficient documentation

## 2012-05-01 DIAGNOSIS — I10 Essential (primary) hypertension: Secondary | ICD-10-CM

## 2012-05-01 DIAGNOSIS — I2510 Atherosclerotic heart disease of native coronary artery without angina pectoris: Secondary | ICD-10-CM

## 2012-05-01 LAB — BASIC METABOLIC PANEL
Calcium: 8.6 mg/dL (ref 8.4–10.5)
Creatinine, Ser: 2.4 mg/dL — ABNORMAL HIGH (ref 0.4–1.2)
GFR: 21.23 mL/min — ABNORMAL LOW (ref >60.00)
Sodium: 139 mEq/L (ref 135–145)

## 2012-05-01 NOTE — Progress Notes (Signed)
Echocardiogram performed.  

## 2012-05-07 ENCOUNTER — Encounter: Payer: Self-pay | Admitting: Cardiology

## 2012-05-07 NOTE — Telephone Encounter (Signed)
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

## 2012-05-07 NOTE — Telephone Encounter (Signed)
This encounter was created in error - please disregard.

## 2012-06-19 ENCOUNTER — Ambulatory Visit (HOSPITAL_COMMUNITY)
Admission: RE | Admit: 2012-06-19 | Discharge: 2012-06-19 | Disposition: A | Discharge status: Home / Self Care | Payer: Medicare Other | Source: Ambulatory Visit | Attending: Internal Medicine | Admitting: Internal Medicine

## 2012-06-19 ENCOUNTER — Encounter (HOSPITAL_COMMUNITY): Payer: Self-pay

## 2012-06-19 ENCOUNTER — Other Ambulatory Visit: Payer: Self-pay | Admitting: Internal Medicine

## 2012-06-19 ENCOUNTER — Other Ambulatory Visit (HOSPITAL_BASED_OUTPATIENT_CLINIC_OR_DEPARTMENT_OTHER): Payer: Medicare Other | Admitting: Lab

## 2012-06-19 ENCOUNTER — Ambulatory Visit (HOSPITAL_COMMUNITY): Payer: Medicare Other

## 2012-06-19 DIAGNOSIS — K449 Diaphragmatic hernia without obstruction or gangrene: Secondary | ICD-10-CM | POA: Insufficient documentation

## 2012-06-19 DIAGNOSIS — C3490 Malignant neoplasm of unspecified part of unspecified bronchus or lung: Secondary | ICD-10-CM

## 2012-06-19 DIAGNOSIS — K5730 Diverticulosis of large intestine without perforation or abscess without bleeding: Secondary | ICD-10-CM | POA: Insufficient documentation

## 2012-06-19 DIAGNOSIS — R911 Solitary pulmonary nodule: Secondary | ICD-10-CM | POA: Insufficient documentation

## 2012-06-19 DIAGNOSIS — I700 Atherosclerosis of aorta: Secondary | ICD-10-CM | POA: Insufficient documentation

## 2012-06-19 DIAGNOSIS — M810 Age-related osteoporosis without current pathological fracture: Secondary | ICD-10-CM | POA: Insufficient documentation

## 2012-06-19 DIAGNOSIS — Z90710 Acquired absence of both cervix and uterus: Secondary | ICD-10-CM | POA: Insufficient documentation

## 2012-06-19 LAB — CBC WITH DIFFERENTIAL/PLATELET
Basophils Absolute: 0.1 10*3/uL (ref 0.0–0.1)
Eosinophils Absolute: 0.5 10*3/uL (ref 0.0–0.5)
HGB: 12.1 g/dL (ref 11.6–15.9)
LYMPH%: 14 % (ref 14.0–49.7)
MCV: 84 fL (ref 79.5–101.0)
MONO%: 9.8 % (ref 0.0–14.0)
NEUT#: 4.1 10*3/uL (ref 1.5–6.5)
Platelets: 151 10*3/uL (ref 145–400)
RDW: 14.8 % — ABNORMAL HIGH (ref 11.2–14.5)

## 2012-06-19 LAB — COMPREHENSIVE METABOLIC PANEL (CC13)
Albumin: 3.4 g/dL — ABNORMAL LOW (ref 3.5–5.0)
Alkaline Phosphatase: 80 U/L (ref 40–150)
BUN: 30 mg/dL — ABNORMAL HIGH (ref 7.0–26.0)
Glucose: 95 mg/dl (ref 70–99)
Potassium: 4.7 mEq/L (ref 3.5–5.1)

## 2012-06-24 ENCOUNTER — Ambulatory Visit (HOSPITAL_BASED_OUTPATIENT_CLINIC_OR_DEPARTMENT_OTHER): Payer: Medicare Other | Admitting: Internal Medicine

## 2012-06-24 ENCOUNTER — Telehealth: Payer: Self-pay | Admitting: Internal Medicine

## 2012-06-24 ENCOUNTER — Encounter: Payer: Self-pay | Admitting: Internal Medicine

## 2012-06-24 VITALS — BP 155/60 | HR 63 | Temp 97.4°F | Resp 20 | Ht 68.0 in | Wt 156.4 lb

## 2012-06-24 DIAGNOSIS — Z85118 Personal history of other malignant neoplasm of bronchus and lung: Secondary | ICD-10-CM

## 2012-06-24 DIAGNOSIS — C3490 Malignant neoplasm of unspecified part of unspecified bronchus or lung: Secondary | ICD-10-CM

## 2012-06-24 NOTE — Telephone Encounter (Signed)
gv and printed appt schedule for pt for July...gv pt Barium and advised that central scheduling will contact with d/t of ct.

## 2012-06-24 NOTE — Patient Instructions (Signed)
No evidence for disease recurrence on the recent scan. Followup in 6 months with repeat CT scan of the chest, abdomen and pelvis without contrast.

## 2012-06-24 NOTE — Progress Notes (Signed)
[facility] Telephone:([phone]) [phone]   Fax:([phone]  OFFICE PROGRESS NOTE  HALL,ZACK, MD [address] [place] [address]  PRINCIPAL DIAGNOSIS: Stage IV non-small cell lung cancer diagnosed in March 2007.   PRIOR THERAPY: Status post 6 cycles of systemic chemotherapy with carboplatin and docetaxel. Last dose was given January 16, 2006.   CURRENT THERAPY: Observation.  INTERVAL HISTORY: [name] 79 y.o. female returns to the clinic today for routine six-month followup visit accompanied her daughter. The patient is feeling fine today with no specific complaints. She denied having any significant weight loss or night sweats. She has no chest pain, shortness breath, cough or hemoptysis. She had repeat CT scan of the chest, abdomen and pelvis without contrast performed recently and she is here for evaluation and discussion of her scan results.  MEDICAL HISTORY: Past Medical History  Diagnosis Date  . Non-small cell carcinoma of lung     Stage IV  . Hypertension   . Hypercholesterolemia   . CKD (chronic kidney disease)   . Coronary artery disease     s/p PCI RCA  December 2006  . GERD (gastroesophageal reflux disease)   . Chronic anemia     ALLERGIES:  is allergic to aspirin; cephalexin; ciprofloxacin; iron; penicillins; statins; and sulfonamide derivatives.  MEDICATIONS:  Current Outpatient Prescriptions  Medication Sig Dispense Refill  . carvedilol (COREG) 12.5 MG tablet TAKE TWO TABLETS BY MOUTH TWICE DAILY  360 tablet  3  . darifenacin (ENABLEX) 7.5 MG 24 hr tablet Take 7.5 mg by mouth daily.      . hydrALAZINE (APRESOLINE) 25 MG tablet Take 25 mg by mouth 2 (two) times daily.       . pantoprazole (PROTONIX) 40 MG tablet TAKE ONE TABLET BY MOUTH EVERY DAY  30 tablet  11  . PLAVIX 75 MG tablet TAKE ONE TABLET BY MOUTH EVERY DAY  30 each  12  . traMADol (ULTRAM) 50 MG tablet prn      . AFLURIA injection Inject 45 mcg into the muscle once.  In October 22,2014        SURGICAL HISTORY:  Past Surgical History  Procedure Date  . Hematoma evacuation December 2006    groin  . Appendectomy   . Cataract extraction   . Hemorroidectomy   . Laminectomy     REVIEW OF SYSTEMS:  A comprehensive review of systems was negative.   PHYSICAL EXAMINATION: General appearance: alert, cooperative and no distress Head: Normocephalic, without obvious abnormality, atraumatic Neck: no adenopathy Lymph nodes: Cervical, supraclavicular, and axillary nodes normal. Resp: clear to auscultation bilaterally Cardio: regular rate and rhythm, S1, S2 normal, no murmur, click, rub or gallop GI: soft, non-tender; bowel sounds normal; no masses,  no organomegaly Extremities: extremities normal, atraumatic, no cyanosis or edema  ECOG PERFORMANCE STATUS: 0 - Asymptomatic  Blood pressure 155/60, pulse 63, temperature 97.4 F (36.3 C), temperature source Oral, resp. rate 20, height 5\' 8"  (1.727 m), weight 156 lb 6.4 oz (70.943 kg).  LABORATORY DATA: Lab Results  Component Value Date   WBC 6.1 06/19/2012   HGB 12.1 06/19/2012   HCT 36.5 06/19/2012   MCV 84.0 06/19/2012   PLT 151 06/19/2012      Chemistry      Component Value Date/Time   NA 140 06/19/2012 0958   NA 139 05/01/2012 0920   NA 143 12/23/2011 0830   K 4.7 06/19/2012 0958   K 4.2 05/01/2012 0920  K 4.7 12/23/2011 0830   CL 104 06/19/2012 0958   CL 105 05/01/2012 0920   CL 101 12/23/2011 0830   CO2 26 06/19/2012 0958   CO2 26 05/01/2012 0920   CO2 27 12/23/2011 0830   BUN 30.0* 06/19/2012 0958   BUN 31* 05/01/2012 0920   BUN 25* 12/23/2011 0830   CREATININE 2.4* 06/19/2012 0958   CREATININE 2.4* 05/01/2012 0920   CREATININE 2.0* 12/23/2011 0830      Component Value Date/Time   CALCIUM 9.0 06/19/2012 0958   CALCIUM 8.6 05/01/2012 0920   CALCIUM 9.0 12/23/2011 0830   ALKPHOS 80 06/19/2012 0958   ALKPHOS 76 12/23/2011 0830   ALKPHOS 91 04/08/2011 1023   AST 14 06/19/2012 0958   AST 16 12/23/2011  0830   AST 17 04/08/2011 1023   ALT 7 06/19/2012 0958   ALT 10 04/08/2011 1023   BILITOT 0.63 06/19/2012 0958   BILITOT 0.60 12/23/2011 0830   BILITOT 0.4 04/08/2011 1023       RADIOGRAPHIC STUDIES: Ct Chest Wo Contrast  06/19/2012  *RADIOLOGY REPORT*  Clinical Data:  Lung cancer.  CT CHEST, ABDOMEN AND PELVIS WITHOUT CONTRAST  Technique:  Multidetector CT imaging of the chest, abdomen and pelvis was performed following the standard protocol without IV contrast.  Comparison:  12/23/2011   CT CHEST  Findings:  The chest wall is unremarkable and stable.  No supraclavicular or axillary lymphadenopathy.  Stable thyroid gland nodules and calcifications.  The bony thorax is intact.  No destructive bone lesions or spinal canal compromise.  Stable osteoporosis.  The heart is normal in size.  No pericardial effusion.  Stable atherosclerotic changes involving the aorta along with tortuosity and ectasia.  Stable mild fusiform enlargement of the ascending aorta with maximum diameter of 4.1 cm.  Dense coronary artery calcifications are again noted.  The esophagus is grossly normal. There are stable scattered mediastinal and hilar lymph nodes but no mass or adenopathy.  A small hiatal hernia is noted.  Examination of the lung parenchyma demonstrates stable emphysematous changes.  The left upper lobe peripheral pulmonary nodule is stable, measuring a maximum of 12 mm.  The left basilar nodule is also stable.  Measuring a maximum of 10 mm.  Multiple smaller pulmonary nodules are also unchanged.  No new pulmonary nodules. No pleural effusion or acute pulmonary findings.  IMPRESSION:  1.  Stable CT appearance of the chest.  No change and bilateral pulmonary nodules. 2.  Stable mediastinal and hilar lymph nodes. 3.  Stable advanced atherosclerotic changes involving the aorta.   CT ABDOMEN AND PELVIS  Findings:  The liver is unremarkable and stable.  No focal hepatic lesions or intrahepatic biliary dilatation.  The gallbladder  is surgically absent.  No common bile duct dilatation.  The pancreas is grossly normal.  Small duodenal diverticulum is noted.  The spleen is normal in size.  No focal lesions.  The adrenal glands and kidneys are stable.  There are scarring changes involving both kidneys.  The stomach, duodenum, small bowel and colon are unremarkable.  No inflammatory changes or mass lesions.  Scattered colonic diverticulosis is again demonstrated.  No mesenteric or retroperitoneal mass or adenopathy.  Small scattered lymph nodes are stable.  Stable advanced atherosclerotic calcifications involving the aorta and branch vessels.  The uterus is surgically absent.  The bladder is normal.  No pelvic mass or adenopathy.  No inguinal adenopathy.  The bony structures are unremarkable.  IMPRESSION: Stable CT appearance of the abdomen/pelvis.  No findings for metastatic disease or adenopathy.   Original Report Authenticated By: Rudie Meyer, M.D.     ASSESSMENT: This is a very pleasant 79 years old white female with metastatic non-small cell lung cancer, status post 6 cycles of systemic chemotherapy was carboplatin and docetaxel completed in August of 2007 with almost complete response. The patient has been observation since that time was no evidence for disease recurrence.  PLAN: I discussed the scan results with the patient and her daughter. I recommended for her to continue on observation for now with repeat CT scan of the chest, abdomen and pelvis without contrast in 6 months. She was advised to call me immediately if she has any concerning symptoms in the interval.  All questions were answered. The patient knows to call the clinic with any problems, questions or concerns. We can certainly see the patient much sooner if necessary.

## 2012-07-06 ENCOUNTER — Telehealth: Payer: Self-pay | Admitting: Cardiology

## 2012-07-06 NOTE — Telephone Encounter (Signed)
I spoke with the pt and she has been suffering with recurrent UTI's for over a year and has been taking medications which have not improved her symptoms.  The pt has seen Nephrology and [facility]. The pt is now seeing a Insurance underwriter in [place] (Dr Renda Rolls).  The pt did have a procedure performed by Dr Renda Rolls to decrease urinary retention.The pt is now taking Enablex. The pt has also been having her bladder irrigated but this has not helped.  The pt states she received a call to come into Dr Mariane Masters office tomorrow for a consultation to discuss a PICC line.  The pt was wondering what this is and why she needs it.  I made the pt aware that it is an IV placed for use of IV antibiotics.  The pt will go to her appointment tomorrow and then she is scheduled to see Dr Riley Kill on 07/15/12.

## 2012-07-06 NOTE — Telephone Encounter (Signed)
Pt calling re having a lot of kidney problems, her dr wants to put in a pic line, is this ok with stuckey?

## 2012-07-13 ENCOUNTER — Ambulatory Visit (HOSPITAL_COMMUNITY)
Admission: RE | Admit: 2012-07-13 | Discharge: 2012-07-13 | Disposition: A | Discharge status: Home / Self Care | Payer: Medicare Other | Source: Ambulatory Visit | Attending: Urology | Admitting: Urology

## 2012-07-13 ENCOUNTER — Ambulatory Visit (HOSPITAL_COMMUNITY)
Admission: RE | Admit: 2012-07-13 | Discharge: 2012-07-13 | Disposition: A | Discharge status: Home / Self Care | Payer: Medicare Other | Source: Ambulatory Visit | Attending: Internal Medicine | Admitting: Internal Medicine

## 2012-07-13 DIAGNOSIS — N390 Urinary tract infection, site not specified: Secondary | ICD-10-CM | POA: Insufficient documentation

## 2012-07-13 MED ORDER — SODIUM CHLORIDE 0.9 % IJ SOLN: 10.0000 mL | INTRAMUSCULAR | Status: DC | PRN

## 2012-07-13 MED ORDER — SODIUM CHLORIDE 0.9 % IJ SOLN: 10.0000 mL | Freq: Two times a day (BID) | INTRAMUSCULAR | Status: DC

## 2012-07-13 NOTE — Progress Notes (Signed)
Picc line inserted for home antibiotic therapy due to ongoing UTI. 39cm. Patient tolerated well

## 2012-07-15 ENCOUNTER — Ambulatory Visit: Payer: Medicare Other | Admitting: Cardiology

## 2012-07-27 ENCOUNTER — Telehealth: Payer: Self-pay | Admitting: Cardiology

## 2012-07-27 NOTE — Telephone Encounter (Signed)
New problem      C/O side effect  From blood pressure. Hydralazine 25 mg . Was prescribe by Dr. Kathrene Bongo. Having UTI infection.

## 2012-07-27 NOTE — Telephone Encounter (Signed)
I spoke with the pt and she called to find out if Hydralazine could be the cause of her frequent UTIs. The pt saw Dr Kathrene Bongo in Feb/March 2013 and was prescribed Hydralazine 25mg  twice a day. Since the pt started taking this medication she has had ongoing issues with UTIs. The pt has recently been followed by Dr Frann Rider and had a PICC line placed 07/13/12 for intravenous antibiotics.  The pt's PICC line was removed last week.  Due to the pt not feeling well she did not pick up her hydralazine Rx for almost a month. While the pt was off this medication her UTI symptoms improved but this was also the same time she was receiving antibiotics. The pt restarted Hydralazine last Thursday and immediately developed burning and painful urination.  The pt stopped the Hydralazine on Sunday and already feels better.  I made the pt aware that if she remains off Hydralazine then her BP should be monitored (120/80 at home per pt).  The pt agreed and will bring her BP readings into 08/05/12 appointment.

## 2012-08-05 ENCOUNTER — Ambulatory Visit: Payer: Medicare Other | Admitting: Cardiology

## 2012-08-21 ENCOUNTER — Encounter: Payer: Self-pay | Admitting: Cardiology

## 2012-08-21 ENCOUNTER — Ambulatory Visit (INDEPENDENT_AMBULATORY_CARE_PROVIDER_SITE_OTHER): Payer: Medicare Other | Admitting: Cardiology

## 2012-08-21 VITALS — BP 160/72 | HR 62 | Ht 68.0 in | Wt 155.0 lb

## 2012-08-21 DIAGNOSIS — I10 Essential (primary) hypertension: Secondary | ICD-10-CM

## 2012-08-21 DIAGNOSIS — E785 Hyperlipidemia, unspecified: Secondary | ICD-10-CM

## 2012-08-21 DIAGNOSIS — N183 Chronic kidney disease, stage 3 unspecified: Secondary | ICD-10-CM

## 2012-08-21 DIAGNOSIS — I2510 Atherosclerotic heart disease of native coronary artery without angina pectoris: Secondary | ICD-10-CM

## 2012-08-21 MED ORDER — AMLODIPINE BESYLATE 5 MG PO TABS: 2.5000 mg | ORAL_TABLET | Freq: Every day | ORAL | Status: DC

## 2012-08-21 NOTE — Patient Instructions (Addendum)
Your physician has recommended you make the following change in your medication: START Amlodipine 5mg  take one-half tablet by mouth daily  Your physician recommends that you return for lab work in: 4 WEEKS (BMP)  Your physician recommends that you schedule a follow-up appointment in: 6 WEEKS with Dr Excell Seltzer (previous pt of Dr Riley Kill)

## 2012-08-21 NOTE — Progress Notes (Signed)
HPI:  This nice lady is in for followup. Since I last saw her, she had had prolonged antibiotics for a urinary tract infection. In addition, she has subsequently stopped and tried again on hydralazine, and had adverse reaction to this. This is been previously prescribed by the nephrology team. She's not having any chest pain, and she is still undergoing cancer care at the oncology Center.  Current Outpatient Prescriptions  Medication Sig Dispense Refill  . AFLURIA injection Inject 45 mcg into the muscle once. In October 22,2014      . carvedilol (COREG) 12.5 MG tablet TAKE TWO TABLETS BY MOUTH TWICE DAILY  360 tablet  3  . darifenacin (ENABLEX) 7.5 MG 24 hr tablet Take 7.5 mg by mouth daily.      . pantoprazole (PROTONIX) 40 MG tablet TAKE ONE TABLET BY MOUTH EVERY DAY  30 tablet  11  . PLAVIX 75 MG tablet TAKE ONE TABLET BY MOUTH EVERY DAY  30 each  12  . traMADol (ULTRAM) 50 MG tablet prn       No current facility-administered medications for this visit.    Allergies  Allergen Reactions  . Aspirin   . Cephalexin   . Ciprofloxacin     REACTION: weakness  . Iron   . Penicillins   . Statins   . Sulfonamide Derivatives     Past Medical History  Diagnosis Date  . Non-small cell carcinoma of lung     Stage IV  . Hypertension   . Hypercholesterolemia   . CKD (chronic kidney disease)   . Coronary artery disease     s/p PCI RCA  December 2006  . GERD (gastroesophageal reflux disease)   . Chronic anemia     Past Surgical History  Procedure Laterality Date  . Hematoma evacuation  December 2006    groin  . Appendectomy    . Cataract extraction    . Hemorroidectomy    . Laminectomy      Family History  Problem Relation Age of Onset  . Heart failure Mother   . Lung cancer Sister   . Lung cancer Brother   . Stomach cancer Brother     History   Social History  . Marital Status: Widowed    Spouse Name: N/A    Number of Children: N/A  . Years of Education: N/A    Occupational History  . Not on file.   Social History Main Topics  . Smoking status: Former Smoker    Types: Cigarettes  . Smokeless tobacco: Not on file  . Alcohol Use: No  . Drug Use:   . Sexually Active:    Other Topics Concern  . Not on file   Social History Narrative  . No narrative on file    ROS: Please see the HPI.  All other systems reviewed and negative.  PHYSICAL EXAM:  BP 160/72  Pulse 62  Ht 5\' 8"  (1.727 m)  Wt 155 lb (70.308 kg)  BMI 23.57 kg/m2  SpO2 99%  General: Well developed, well nourished, in no acute distress. Head:  Normocephalic and atraumatic. Neck: no JVD Lungs: Clear to auscultation and percussion. Heart: Normal S1 and S2.  Prominent S4 gallop.  Apical murmur  1-2/6.   Pulses: Pulses normal in all 4 extremities. Extremities: No clubbing or cyanosis. No edema. Neurologic: Alert and oriented x 3.  EKG:  NSR with first degree av block.  Inferior IM, age indeterminate.  LVH.    ECHO  Study  Conclusions  - Left ventricle: The cavity size was normal. Wall thickness was increased in a pattern of moderate LVH. Systolic function was mildly to moderately reduced. The estimated ejection fraction was in the range of 40% to 45%. Wall motion was normal; there were no regional wall motion abnormalities. Features are consistent with a pseudonormal left ventricular filling pattern, with concomitant abnormal relaxation and increased filling pressure (grade 2 diastolic dysfunction). - Aortic valve: Trivial regurgitation. - Mitral valve: Mild regurgitation. - Left atrium: The atrium was mildly dilated. - Atrial septum: No defect or patent foramen ovale was identified. - Pulmonary arteries: PA peak pressure: 35mm Hg (S).     ASSESSMENT AND PLAN:  1.  Prior MI, inferior with PCI  --- remains on plavix 2.  HTN ---  Not great control  (She says 138/68 at home, but EF down slightly by echo 3.  CKD, stage 3 4.  History of recurrent UTI 5.   Lung CA followed at Cancer center.    Needs better BP control.  Add amlodipine to regimen.  2.5 initially.

## 2012-08-24 NOTE — Assessment & Plan Note (Signed)
Now following with Urology up in [facility].  Need for chronic reassessment emphasized.  TS

## 2012-08-24 NOTE — Assessment & Plan Note (Signed)
Has not been on statins for a long time for a variety of reasons.

## 2012-08-24 NOTE — Assessment & Plan Note (Signed)
Continues to remain stable 

## 2012-08-24 NOTE — Assessment & Plan Note (Signed)
Not well controlled.  Will again add low dose amlodipine to her regimen.

## 2012-09-30 ENCOUNTER — Other Ambulatory Visit (INDEPENDENT_AMBULATORY_CARE_PROVIDER_SITE_OTHER): Payer: Medicare Other

## 2012-09-30 DIAGNOSIS — I10 Essential (primary) hypertension: Secondary | ICD-10-CM

## 2012-09-30 DIAGNOSIS — I2510 Atherosclerotic heart disease of native coronary artery without angina pectoris: Secondary | ICD-10-CM

## 2012-09-30 LAB — BASIC METABOLIC PANEL
CO2: 27 mEq/L (ref 19–32)
GFR: 21.85 mL/min — ABNORMAL LOW (ref >60.00)
Glucose, Bld: 97 mg/dL (ref 70–99)
Potassium: 4.1 mEq/L (ref 3.5–5.1)
Sodium: 141 mEq/L (ref 135–145)

## 2012-10-07 ENCOUNTER — Ambulatory Visit (INDEPENDENT_AMBULATORY_CARE_PROVIDER_SITE_OTHER): Payer: Medicare Other | Admitting: Cardiovascular Disease

## 2012-10-07 ENCOUNTER — Encounter: Payer: Self-pay | Admitting: Cardiovascular Disease

## 2012-10-07 VITALS — BP 150/80 | HR 64 | Ht 68.0 in | Wt 156.0 lb

## 2012-10-07 DIAGNOSIS — I2510 Atherosclerotic heart disease of native coronary artery without angina pectoris: Secondary | ICD-10-CM

## 2012-10-07 DIAGNOSIS — I10 Essential (primary) hypertension: Secondary | ICD-10-CM

## 2012-10-07 MED ORDER — FUROSEMIDE 40 MG PO TABS: 40.0000 mg | ORAL_TABLET | Freq: Every day | ORAL | Status: DC

## 2012-10-07 MED ORDER — CARVEDILOL 25 MG PO TABS: 25.0000 mg | ORAL_TABLET | Freq: Two times a day (BID) | ORAL | Status: DC

## 2012-10-07 NOTE — Progress Notes (Signed)
HPI:   79 year-old woman presenting for follow-up evaluation. She has CAD and underwent PCI of the RCA with a bare-metal stent platform in 2007. Ongoing medical issues have included hypertension and chronic kidney disease.  She complains of right leg swelling over the last several months. She's also had mild dyspnea with exertion. She has no chest pain or pressure. She was treated with prednisone for the right foot swelling for presumed gout, but patient did not have much pain and did not feel like this was gout related.  She otherwise is doing well. She denies bleeding problems. She's allergic to aspirin and takes long-term Plavix.  Outpatient Encounter Prescriptions as of 10/07/2012  Medication Sig Dispense Refill  . amLODipine (NORVASC) 5 MG tablet Take 0.5 tablets (2.5 mg total) by mouth daily.  30 tablet  3  . carvedilol (COREG) 12.5 MG tablet TAKE TWO TABLETS BY MOUTH TWICE DAILY  360 tablet  3  . pantoprazole (PROTONIX) 40 MG tablet TAKE ONE TABLET BY MOUTH EVERY DAY  30 tablet  11  . PLAVIX 75 MG tablet TAKE ONE TABLET BY MOUTH EVERY DAY  30 each  12  . traMADol (ULTRAM) 50 MG tablet prn      . [DISCONTINUED] AFLURIA injection Inject 45 mcg into the muscle once. In October 22,2014      . [DISCONTINUED] darifenacin (ENABLEX) 7.5 MG 24 hr tablet Take 7.5 mg by mouth daily.       No facility-administered encounter medications on file as of 10/07/2012.    Allergies  Allergen Reactions  . Aspirin   . Cephalexin   . Ciprofloxacin     REACTION: weakness  . Iron   . Penicillins   . Statins   . Sulfonamide Derivatives     Past Medical History  Diagnosis Date  . Non-small cell carcinoma of lung     Stage IV  . Hypertension   . Hypercholesterolemia   . CKD (chronic kidney disease)   . Coronary artery disease     s/p PCI RCA  December 2006  . GERD (gastroesophageal reflux disease)   . Chronic anemia     ROS: Negative except as per HPI  BP 150/80  Pulse 64  Ht 5\' 8"  (1.727  m)  Wt 70.761 kg (156 lb)  BMI 23.73 kg/m2  SpO2 98%  PHYSICAL EXAM: Pt is alert and oriented, NAD HEENT: normal Neck: JVP - normal, carotids 2+= without bruits Lungs: CTA bilaterally CV: RRR without murmur or gallop Abd: soft, NT, Positive BS, no hepatomegaly Ext: 1+ pretibial edema bilaterally, distal pulses intact and equal Skin: warm/dry no rash  2 D Echo: Study Conclusions  - Left ventricle: The cavity size was normal. Wall thickness was increased in a pattern of moderate LVH. Systolic function was mildly to moderately reduced. The estimated ejection fraction was in the range of 40% to 45%. Wall motion was normal; there were no regional wall motion abnormalities. Features are consistent with a pseudonormal left ventricular filling pattern, with concomitant abnormal relaxation and increased filling pressure (grade 2 diastolic dysfunction). - Aortic valve: Trivial regurgitation. - Mitral valve: Mild regurgitation. - Left atrium: The atrium was mildly dilated. - Atrial septum: No defect or patent foramen ovale was identified. - Pulmonary arteries: PA peak pressure: 35mm Hg (S).  ASSESSMENT AND PLAN: 1. Coronary artery disease, native vessel. The patient is stable without anginal symptoms. She'll continue Plavix considering her aspirin allergy.  2. Chronic systolic and diastolic heart failure, LVEF 40-45%. Suspect  this is primarily a hypertensive cardiomyopathy after review of her echo. Will add furosemide 40 mg daily as she does have mild edema and shortness of breath with activity. Her chronic kidney disease will have to be followed closely and I will followup with a metabolic panel within 2 weeks. Will try to stop amlodipine because her legs are really bothering her and she thinks the leg swelling did not occur until she started taking this medication. As she is hypertensive, will increase her carvedilol to 25 mg twice daily.  3. Chronic kidney disease, stage III. As above,  will closely followup her creatinine after starting furosemide. She was recently prescribed Indocin and has taken this for gout. I advised her that this shouldn't be taken with chronic kidney disease.  For follow-up I'll see her back in 4 months.  Tonny Bollman 10/09/2012 3:09 PM

## 2012-10-07 NOTE — Patient Instructions (Addendum)
Your physician has recommended you make the following change in your medication: STOP Amlodipine, INCREASE Carvedilol to 25mg  take one by mouth twice a day, START Furosemide 40mg  take one by mouth daily  Your physician recommends that you return for lab work in: 2 WEEKS (BMP)  Your physician recommends that you schedule a follow-up appointment in: 4 MONTHS with Dr Excell Seltzer  Do Not take INDOCIN

## 2012-10-09 ENCOUNTER — Encounter: Payer: Self-pay | Admitting: Cardiovascular Disease

## 2012-10-28 ENCOUNTER — Other Ambulatory Visit: Payer: Medicare Other

## 2012-10-30 ENCOUNTER — Other Ambulatory Visit (INDEPENDENT_AMBULATORY_CARE_PROVIDER_SITE_OTHER): Payer: Medicare Other

## 2012-10-30 DIAGNOSIS — I10 Essential (primary) hypertension: Secondary | ICD-10-CM

## 2012-10-30 DIAGNOSIS — I2510 Atherosclerotic heart disease of native coronary artery without angina pectoris: Secondary | ICD-10-CM

## 2012-10-30 LAB — BASIC METABOLIC PANEL
BUN: 33 mg/dL — ABNORMAL HIGH (ref 6–23)
CO2: 28 mEq/L (ref 19–32)
Chloride: 106 mEq/L (ref 96–112)
Creatinine, Ser: 2.2 mg/dL — ABNORMAL HIGH (ref 0.4–1.2)

## 2012-11-02 ENCOUNTER — Telehealth: Payer: Self-pay | Admitting: Cardiovascular Disease

## 2012-11-02 NOTE — Telephone Encounter (Signed)
I spoke with the pt and she is taking Coreg 25mg  twice a day. After reviewing her chart further the pt has been taking Coreg 12.5mg  two tablets twice a day for some time, so the pt's medications were not actually increased at 10/07/12 office visit.  The pt has tried hydralazine and amlodipine and did not tolerate these medications.  The pt plans on checking her BP at home for the next 2 weeks and will contact our office if her BP remains elevated.  Pt agreed with plan. The pt has also been limited on BP medications due to renal insufficiency.

## 2012-11-02 NOTE — Telephone Encounter (Signed)
New problem  Pt has a question regarding her BP medicine. She said Dr Excell Seltzer was going to increase her meds but it was not increase.  She asked if you could give her a call back.

## 2012-11-02 NOTE — Telephone Encounter (Signed)
I spoke with Denny Peon and she said the pt is confused about her medications.  I told Denny Peon that we increased the pt's Coreg from 12.5 twice a day to 25mg  twice a day at last office visit.  Per Denny Peon the pt was already taking Coreg 25mg  twice a day prior to her visit.  I instructed Erin to have the pt contact our office to further clarify her medications.

## 2012-11-11 ENCOUNTER — Other Ambulatory Visit: Payer: Self-pay

## 2012-11-11 MED ORDER — PANTOPRAZOLE SODIUM 40 MG PO TBEC: DELAYED_RELEASE_TABLET | ORAL | Status: DC

## 2012-11-17 ENCOUNTER — Encounter: Payer: Self-pay | Admitting: Cardiovascular Disease

## 2012-12-10 ENCOUNTER — Telehealth: Payer: Self-pay | Admitting: *Deleted

## 2012-12-10 NOTE — Telephone Encounter (Signed)
Returned patient's call about not receiving correct dosage of Coreg from pharmacy but no one answered. Called to verify with pharmacy and they stated she received 90 supply (180 tablets) 10-07-12 and they are not able to refill per insurance yet. Patient wants a call back to talk about her rx. Will forward to nurse.   Lakai Moree, cma

## 2012-12-10 NOTE — Telephone Encounter (Signed)
I spoke with the pt and she picked up her Carvedilol Rx on 10/07/12.  This bottle should have contained 180 tablets to last 3 months.  The pt said she only has enough to last 3 days and the pharmacy will not refill her medication until 01/08/13. The pt would like to know what she should do.  I made her aware that she needs to go to the pharmacy and explain that she was shorted on her medication.  If the pharmacy cannot provide her with medication then she will have to pay our of pocket for medication.

## 2012-12-10 NOTE — Telephone Encounter (Signed)
Left message for pt to contact the office.

## 2012-12-10 NOTE — Telephone Encounter (Signed)
Follow up  ° ° ° ° °Pt is returning your call  °

## 2012-12-10 NOTE — Telephone Encounter (Signed)
This encounter was created in error - please disregard.

## 2012-12-22 ENCOUNTER — Ambulatory Visit (HOSPITAL_COMMUNITY)
Admission: RE | Admit: 2012-12-22 | Discharge: 2012-12-22 | Disposition: A | Discharge status: Home / Self Care | Payer: Medicare Other | Source: Ambulatory Visit | Attending: Internal Medicine | Admitting: Internal Medicine

## 2012-12-22 ENCOUNTER — Other Ambulatory Visit (HOSPITAL_BASED_OUTPATIENT_CLINIC_OR_DEPARTMENT_OTHER): Payer: Medicare Other | Admitting: Lab

## 2012-12-22 ENCOUNTER — Encounter (HOSPITAL_COMMUNITY): Payer: Self-pay

## 2012-12-22 DIAGNOSIS — E041 Nontoxic single thyroid nodule: Secondary | ICD-10-CM | POA: Insufficient documentation

## 2012-12-22 DIAGNOSIS — K5730 Diverticulosis of large intestine without perforation or abscess without bleeding: Secondary | ICD-10-CM | POA: Insufficient documentation

## 2012-12-22 DIAGNOSIS — I700 Atherosclerosis of aorta: Secondary | ICD-10-CM | POA: Insufficient documentation

## 2012-12-22 DIAGNOSIS — IMO0002 Reserved for concepts with insufficient information to code with codable children: Secondary | ICD-10-CM | POA: Insufficient documentation

## 2012-12-22 DIAGNOSIS — C3490 Malignant neoplasm of unspecified part of unspecified bronchus or lung: Secondary | ICD-10-CM

## 2012-12-22 DIAGNOSIS — J90 Pleural effusion, not elsewhere classified: Secondary | ICD-10-CM | POA: Insufficient documentation

## 2012-12-22 DIAGNOSIS — M899 Disorder of bone, unspecified: Secondary | ICD-10-CM | POA: Insufficient documentation

## 2012-12-22 DIAGNOSIS — M47814 Spondylosis without myelopathy or radiculopathy, thoracic region: Secondary | ICD-10-CM | POA: Insufficient documentation

## 2012-12-22 LAB — CBC WITH DIFFERENTIAL/PLATELET
Basophils Absolute: 0.1 10*3/uL (ref 0.0–0.1)
Eosinophils Absolute: 0.7 10*3/uL — ABNORMAL HIGH (ref 0.0–0.5)
HGB: 12 g/dL (ref 11.6–15.9)
MCV: 82.7 fL (ref 79.5–101.0)
MONO#: 0.5 10*3/uL (ref 0.1–0.9)
NEUT#: 3.7 10*3/uL (ref 1.5–6.5)
RDW: 14.2 % (ref 11.2–14.5)
WBC: 5.7 10*3/uL (ref 3.9–10.3)
lymph#: 0.8 10*3/uL — ABNORMAL LOW (ref 0.9–3.3)

## 2012-12-22 LAB — COMPREHENSIVE METABOLIC PANEL (CC13)
Albumin: 3.4 g/dL — ABNORMAL LOW (ref 3.5–5.0)
Alkaline Phosphatase: 85 U/L (ref 40–150)
BUN: 36.9 mg/dL — ABNORMAL HIGH (ref 7.0–26.0)
CO2: 27 mEq/L (ref 22–29)
Calcium: 9.1 mg/dL (ref 8.4–10.4)
Glucose: 104 mg/dl (ref 70–140)
Potassium: 4.3 mEq/L (ref 3.5–5.1)

## 2012-12-24 ENCOUNTER — Ambulatory Visit (HOSPITAL_BASED_OUTPATIENT_CLINIC_OR_DEPARTMENT_OTHER): Payer: Medicare Other | Admitting: Internal Medicine

## 2012-12-24 ENCOUNTER — Telehealth: Payer: Self-pay | Admitting: Internal Medicine

## 2012-12-24 DIAGNOSIS — C3490 Malignant neoplasm of unspecified part of unspecified bronchus or lung: Secondary | ICD-10-CM

## 2012-12-24 NOTE — Telephone Encounter (Signed)
gv and printed appt sched and avs...gv pt barium

## 2012-12-24 NOTE — Progress Notes (Signed)
[facility] [phone]   [phone]  OFFICE PROGRESS NOTE  Catalina Pizza, MD  [address] [place] [address]  PRINCIPAL DIAGNOSIS: Stage IV non-small cell lung cancer diagnosed in March 2007.   PRIOR THERAPY: Status post 6 cycles of systemic chemotherapy with carboplatin and docetaxel. Last dose was given January 16, 2006.   CURRENT THERAPY: Observation.  DISEASE STAGE: Stage IV non-small cell lung cancer diagnosed in March of 2007.  CHEMOTHERAPY INTENT: Palliative  CURRENT # OF CHEMOTHERAPY CYCLES: 0  CURRENT ANTIEMETICS: None  CURRENT SMOKING STATUS: Nonsmoker  ORAL CHEMOTHERAPY AND CONSENT: None  CURRENT BISPHOSPHONATES USE: None  LIVING WILL AND CODE STATUS: Full code  INTERVAL HISTORY: [name] 79 y.o. female returns to the clinic today for six-month followup visit accompanied by her daughter. The patient is feeling fine today with no specific complaints. She denied having any significant chest pain, shortness breath, cough or hemoptysis. The patient denied having any weight loss or night sweats. She had repeat CT scan of the chest, abdomen and pelvis performed recently and she is here for evaluation and discussion of her scan results.  MEDICAL HISTORY: Past Medical History  Diagnosis Date  . Non-small cell carcinoma of lung     Stage IV  . Hypertension   . Hypercholesterolemia   . CKD (chronic kidney disease)   . Coronary artery disease     s/p PCI RCA  December 2006  . GERD (gastroesophageal reflux disease)   . Chronic anemia     ALLERGIES:  is allergic to amlodipine; aspirin; cephalexin; ciprofloxacin; hydralazine; iron; penicillins; statins; and sulfonamide derivatives.  MEDICATIONS:  Current Outpatient Prescriptions  Medication Sig Dispense Refill  . carvedilol (COREG) 25 MG tablet Take 1 tablet (25 mg total) by mouth 2 (two) times daily with a meal.  180 tablet  3  . PLAVIX 75 MG tablet TAKE ONE  TABLET BY MOUTH EVERY DAY  30 each  12  . furosemide (LASIX) 40 MG tablet Take 1 tablet (40 mg total) by mouth daily.  90 tablet  3  . pantoprazole (PROTONIX) 40 MG tablet TAKE ONE TABLET BY MOUTH EVERY DAY  30 tablet  5  . traMADol (ULTRAM) 50 MG tablet prn       No current facility-administered medications for this visit.    SURGICAL HISTORY:  Past Surgical History  Procedure Laterality Date  . Hematoma evacuation  December 2006    groin  . Appendectomy    . Cataract extraction    . Hemorroidectomy    . Laminectomy      REVIEW OF SYSTEMS:  A comprehensive review of systems was negative.   PHYSICAL EXAMINATION: General appearance: alert, cooperative and no distress Head: Normocephalic, without obvious abnormality, atraumatic Neck: no adenopathy Lymph nodes: Cervical, supraclavicular, and axillary nodes normal. Resp: clear to auscultation bilaterally Cardio: regular rate and rhythm, S1, S2 normal, no murmur, click, rub or gallop GI: soft, non-tender; bowel sounds normal; no masses,  no organomegaly Extremities: extremities normal, atraumatic, no cyanosis or edema  ECOG PERFORMANCE STATUS: 1 - Symptomatic but completely ambulatory  Blood pressure 161/73, pulse 57, temperature 96.8 F (36 C), temperature source Oral, resp. rate 18, height 5\' 8"  (1.727 m), weight 153 lb 1.6 oz (69.446 kg).  LABORATORY DATA: Lab Results  Component Value Date   WBC 5.7 12/22/2012   HGB 12.0 12/22/2012   HCT 36.4 12/22/2012   MCV 82.7 12/22/2012   PLT  169 12/22/2012      Chemistry      Component Value Date/Time   NA 140 12/22/2012 0920   NA 138 10/30/2012 1054   NA 143 12/23/2011 0830   K 4.3 12/22/2012 0920   K 4.0 10/30/2012 1054   K 4.7 12/23/2011 0830   CL 106 10/30/2012 1054   CL 104 06/19/2012 0958   CL 101 12/23/2011 0830   CO2 27 12/22/2012 0920   CO2 28 10/30/2012 1054   CO2 27 12/23/2011 0830   BUN 36.9* 12/22/2012 0920   BUN 33* 10/30/2012 1054   BUN 25* 12/23/2011 0830   CREATININE  2.2* 12/22/2012 0920   CREATININE 2.2* 10/30/2012 1054   CREATININE 2.0* 12/23/2011 0830      Component Value Date/Time   CALCIUM 9.1 12/22/2012 0920   CALCIUM 8.9 10/30/2012 1054   CALCIUM 9.0 12/23/2011 0830   ALKPHOS 85 12/22/2012 0920   ALKPHOS 76 12/23/2011 0830   ALKPHOS 91 04/08/2011 1023   AST 15 12/22/2012 0920   AST 16 12/23/2011 0830   AST 17 04/08/2011 1023   ALT 6 12/22/2012 0920   ALT 10 04/08/2011 1023   BILITOT 0.41 12/22/2012 0920   BILITOT 0.60 12/23/2011 0830   BILITOT 0.4 04/08/2011 1023       RADIOGRAPHIC STUDIES: Ct Chest Wo Contrast  12/22/2012   *RADIOLOGY REPORT*  Clinical Data:  Restaging lung cancer  CT CHEST, ABDOMEN AND PELVIS WITHOUT CONTRAST  Technique:  Multidetector CT imaging of the chest, abdomen and pelvis was performed following the standard protocol without IV contrast.  Comparison:  06/19/2012    CT CHEST  Findings:  Small right pleural effusion is identified, image number 52.  Scarring and/or subsegmental atelectasis noted in the lingular portion of the left lung.  Pulmonary nodule within the left upper lobe measures 1.2 cm, image 21/series 4.  This is stable from previous exam.  Pulmonary nodule within the posterior left lower lobe is stable measuring 4 mm, image number 34/series 4.  Stable right middle lobe nodule measuring 5 mm, image 44/series 4.  No new or enlarging pulmonary nodules or masses identified.  The heart size appears normal.  No pericardial effusion.  Calcified atherosclerotic disease affects the thoracic aorta.  There are calcifications identified involving the LAD and RCA coronary arteries.  No enlarged mediastinal or hilar lymph nodes.  There is no axillary or supraclavicular adenopathy identified.  Small nodules within the thyroid gland are again noted.  The largest is in the right lobe measuring 1.6 cm.  Review of the visualized osseous structures is significant for mild osteopenia.  There is mild spondylosis within the thoracic spine. No  aggressive lytic or sclerotic bone lesions identified.  IMPRESSION:  1.  No acute cardiopulmonary abnormalities. 2.  Stable CT appearance of the chest.  No change and small bilateral pulmonary nodules. 3.  Small right pleural effusion is new from previous exam.  4.  Similar appearance of thyroid nodules.    CT ABDOMEN AND PELVIS  Findings:  No suspicious liver abnormalities.  Previous cholecystectomy.  The pancreas is normal.  Normal appearance of the spleen.  The adrenal glands are both unremarkable.  Bilateral renal cortical thinning noted.  The urinary bladder appears within normal limits. Previous hysterectomy.  Calcified atherosclerotic disease is identified involving the abdominal aorta.  There is no aneurysm. There is no enlarged lymph nodes within the upper abdomen.  There are no enlarged pelvic or inguinal lymph nodes.  The stomach is normal.  The small bowel loops have a normal course and caliber without evidence for bowel obstruction.  Normal appearance of the proximal colon.  Scattered distal colonic diverticula identified.  Review of the visualized osseous structures is significant for osteopenia and degenerative disc disease.  No worrisome lytic or sclerotic bone lesions identified.  IMPRESSION:  1.  No acute findings. 2.  No specific features to suggest metastatic disease to the abdomen or pelvis.   Original Report Authenticated By: Signa Kell, M.D.    ASSESSMENT AND PLAN: This is a very pleasant 79 years old white female with history of stage IV non-small cell lung cancer diagnosed in March of 2007 is status post 6 cycles of systemic chemotherapy with carboplatin and docetaxel and has been observation since that time was no evidence for disease progression. I discussed the scan results with the patient and her daughter.  I recommended for her to continue on observation with repeat CT scan of the chest, abdomen and pelvis without contrast in 6 months. The patient was advised to call  immediately if she has any concerning symptoms in the interval.  All questions were answered. The patient knows to call the clinic with any problems, questions or concerns. We can certainly see the patient much sooner if necessary.

## 2012-12-26 ENCOUNTER — Encounter: Payer: Self-pay | Admitting: Internal Medicine

## 2012-12-26 NOTE — Patient Instructions (Signed)
DISEASE STAGE: Stage IV non-small cell lung cancer diagnosed in March of 2007.  CHEMOTHERAPY INTENT: Palliative  CURRENT # OF CHEMOTHERAPY CYCLES: 0  CURRENT ANTIEMETICS: None  CURRENT SMOKING STATUS: Nonsmoker  ORAL CHEMOTHERAPY AND CONSENT: None  CURRENT BISPHOSPHONATES USE: None  LIVING WILL AND CODE STATUS: Full code  No evidence for disease progression on his recent scan.  Followup visit in 6 months with repeat CT scan of the chest, abdomen and pelvis.

## 2013-01-06 ENCOUNTER — Other Ambulatory Visit: Payer: Self-pay

## 2013-01-25 ENCOUNTER — Telehealth: Payer: Self-pay | Admitting: *Deleted

## 2013-01-25 NOTE — Telephone Encounter (Signed)
Patient called in directly to triage line.Main complaint is SOB, LE swelling and moderate to severe fatigue currently. States she recently changed from Dr. Riley Kill to Dr. Excell Seltzer. She is experiencing swelling to feet/LE, mild SOB, no cough, increased fatigue and no energy. States she takes 40mg  Lasix daily and in the past an increase of Lasix did not help to alleviate her LE swelling. States that she received information from [facility] pamphlet that states patients with CAD should be on an ACE Inhibitor or an ARB. She states she is unable and will not take Amlodipine. Looking for advisement. Dr. Myrtis Ser advises for patient to be seen tomorrow by the On Call MD at [facility]. He further advises that should she experience worsening symptoms tonight, such as increased SOB, noted elevated BP, increase in current fatigue, and worsening LE swelling or develop headache/blurry vision/CP, she should seek immediate care at the ED. Attempted multiple times to contact patient to convey Dr. Henrietta Hoover advisement without success. [facility] and [facility] will contact her in AM regarding being seen by provider.

## 2013-01-26 NOTE — Telephone Encounter (Signed)
Follow Up ° ° ° ° °Pt calling returning your call. °

## 2013-01-26 NOTE — Telephone Encounter (Signed)
Dr Excell Seltzer aware that pt called and the pt can be seen this week for evaluation.  I called the pt's home and she is currently at work.  The young lady that answered the phone said she will have the pt call our office when she gets home today.

## 2013-01-26 NOTE — Telephone Encounter (Signed)
Patient returned call from last evening. States that she took an Paramedic" pill and she feels better and her leg swelling has improved. States that she did tell "CNA yesterday that she was SOB and had leg swelling but that Dr. Shirline Frees instructed her to call cardiologist to be seen and that she has "had anxiety in the past and maybe that is what it was". Advised that Dr. Excell Seltzer can see her this Friday at 2pm. She states she thinks her next appointment is in November. Clarified for her that her upcoming appt with Dr. Excell Seltzer is actually scheduled for September 9th in the morning. She states that she works and can not come in then so she will come in this Friday instead at 2pm. Verbalized that she is improved from yesterday with no SOB and improvement regarding her leg swelling.

## 2013-01-29 ENCOUNTER — Encounter: Payer: Self-pay | Admitting: Cardiovascular Disease

## 2013-01-29 ENCOUNTER — Ambulatory Visit (INDEPENDENT_AMBULATORY_CARE_PROVIDER_SITE_OTHER): Payer: Medicare Other | Admitting: Cardiovascular Disease

## 2013-01-29 VITALS — BP 148/84 | HR 63 | Ht 68.0 in | Wt 157.0 lb

## 2013-01-29 DIAGNOSIS — I2510 Atherosclerotic heart disease of native coronary artery without angina pectoris: Secondary | ICD-10-CM

## 2013-01-29 DIAGNOSIS — I10 Essential (primary) hypertension: Secondary | ICD-10-CM

## 2013-01-29 DIAGNOSIS — R609 Edema, unspecified: Secondary | ICD-10-CM

## 2013-01-29 NOTE — Patient Instructions (Addendum)
Your physician recommends that you schedule a follow-up appointment in: 3 MONTHS  Your physician recommends that you continue on your current medications as directed. Please refer to the Current Medication list given to you today.   

## 2013-01-29 NOTE — Progress Notes (Signed)
HPI:  79 year old woman presenting for cardiology followup evaluation. The patient has coronary artery disease and she has undergone coronary stenting in 2007 when she was treated with a bare-metal stent in the right coronary artery. She also has hypertension and chronic kidney disease. The patient has stage IV non-small cell lung cancer and has been treated since 2007 with palliative chemotherapy. Apparently she's not had much disease progression. She's been treated with Plavix for her coronary disease as she is aspirin allergic. The patient's left ventricular ejection fraction is been in the range of 40-45%. When I saw her last in May 2014, amlodipine was discontinued because of leg swelling. She was started on furosemide. Her last lab work in July 2014 showed stable renal function with a creatinine of 2.2 mg/dL.  The patient was not able to tolerate furosemide. She felt very weak when she took it. She's taken one time since but cannot take it on a regular basis. She does complain of leg swelling. She does not have orthopnea or PND. She has chronic exertional dyspnea which is limiting for her. She uses a motorized chair when she goes to the store. She denies chest pain or pressure.  Outpatient Encounter Prescriptions as of 01/29/2013  Medication Sig Dispense Refill  . carvedilol (COREG) 25 MG tablet Take 1 tablet (25 mg total) by mouth 2 (two) times daily with a meal.  180 tablet  3  . furosemide (LASIX) 40 MG tablet Take 1 tablet (40 mg total) by mouth daily.  90 tablet  3  . pantoprazole (PROTONIX) 40 MG tablet TAKE ONE TABLET BY MOUTH EVERY DAY  30 tablet  5  . PLAVIX 75 MG tablet TAKE ONE TABLET BY MOUTH EVERY DAY  30 each  12  . traMADol (ULTRAM) 50 MG tablet prn       No facility-administered encounter medications on file as of 01/29/2013.    Allergies  Allergen Reactions  . Amlodipine Swelling  . Aspirin   . Cephalexin   . Ciprofloxacin     REACTION: weakness  . Hydralazine   . Iron    . Penicillins   . Statins   . Sulfonamide Derivatives     Past Medical History  Diagnosis Date  . Non-small cell carcinoma of lung     Stage IV  . Hypertension   . Hypercholesterolemia   . CKD (chronic kidney disease)   . Coronary artery disease     s/p PCI RCA  December 2006  . GERD (gastroesophageal reflux disease)   . Chronic anemia     ROS: Negative except as per HPI  BP 148/84  Pulse 63  Ht 5\' 8"  (1.727 m)  Wt 157 lb (71.215 kg)  BMI 23.88 kg/m2  SpO2 96%  PHYSICAL EXAM: Pt is alert and oriented, NAD HEENT: normal Neck: JVP - normal, carotids 2+= without bruits Lungs: CTA bilaterally CV: RRR without murmur or gallop Abd: soft, NT, Positive BS, no hepatomegaly Ext: Trace pretibial and ankle edema bilaterally, distal pulses intact and equal Skin: warm/dry no rash  EKG:  Normal sinus rhythm, left ventricular hypertrophy, nonspecific ST abnormality.  ASSESSMENT AND PLAN: 1. Coronary artery disease, native vessel. The patient remains stable without anginal symptoms. She will continue Plavix in the setting of her aspirin allergy.  2. Chronic shortness of breath. I don't think her symptoms are related to congestive heart failure. She is intolerant to multiple medications and I reviewed with her today that I doubt changing her program and will  help symptoms. In fact, she said so much trouble with different medicines we can probably do more harm than good with making changes at this point.  3. Edema. Her exam is underwhelming to me. There is only trace edema in her ankles. She continues furosemide as needed. She will continue to use compression stockings.  For followup, I will see her back in 3 months.  Tonny Bollman 01/29/2013 2:43 PM

## 2013-02-09 ENCOUNTER — Ambulatory Visit: Payer: Medicare Other | Admitting: Cardiovascular Disease

## 2013-03-27 ENCOUNTER — Other Ambulatory Visit: Payer: Self-pay | Admitting: Cardiology

## 2013-04-08 ENCOUNTER — Other Ambulatory Visit: Payer: Self-pay

## 2013-04-12 ENCOUNTER — Telehealth: Payer: Self-pay | Admitting: Cardiovascular Disease

## 2013-04-12 NOTE — Telephone Encounter (Signed)
New message    Did someone fax the hardship letter regarding pts grandson being transferred to [employer] [facility]?  She said she called last week asking [place] to fax a letter stating she cannot drive to where he is now and wanted him to be transferred closer.    Pls let pt know if letter has been faxed.

## 2013-04-12 NOTE — Telephone Encounter (Signed)
I spoke with the pt and made her aware that I have to review this information with Dr Excell Seltzer. She has only been able to visit her grandson twice and it is a [address] one way. The pt states that if the jail can get an MD letter then the pt can be transferred.

## 2013-04-16 ENCOUNTER — Encounter: Payer: Self-pay | Admitting: Cardiovascular Disease

## 2013-04-21 ENCOUNTER — Encounter: Payer: Self-pay | Admitting: Cardiovascular Disease

## 2013-04-21 NOTE — Telephone Encounter (Signed)
I spoke with the [name] and she said the letter needs to be faxed to ATTN: Sunnie Nielsen at [phone].  I will contact the [name] once the letter has been faxed.

## 2013-04-21 NOTE — Telephone Encounter (Signed)
[name] at 04/21/2013 10:35 AM    Status: Signed        New message  Have you faxed the form? Pt said you know what form she is talking about. Would not give me any more info.   Left message for pt to call back. Letter has not been dictated at this time but Dr Excell Seltzer does plan on dictating letter for pt.

## 2013-04-21 NOTE — Telephone Encounter (Signed)
New message     Have you faxed the form?  Pt said you know what form she is talking about.  Would not give me any more info.

## 2013-04-21 NOTE — Telephone Encounter (Signed)
Follow up: ° ° ° °Pt returned your call please give her a call back.  °

## 2013-04-21 NOTE — Telephone Encounter (Signed)
This encounter was created in error - please disregard.

## 2013-04-27 ENCOUNTER — Encounter: Payer: Self-pay | Admitting: Cardiovascular Disease

## 2013-04-27 NOTE — Telephone Encounter (Signed)
Letter completed by Dr Excell Seltzer and faxed per the pt's request. Pt aware.

## 2013-05-05 ENCOUNTER — Ambulatory Visit: Payer: Medicare Other | Admitting: Cardiovascular Disease

## 2013-05-10 ENCOUNTER — Other Ambulatory Visit: Payer: Self-pay

## 2013-05-10 MED ORDER — PANTOPRAZOLE SODIUM 40 MG PO TBEC: DELAYED_RELEASE_TABLET | ORAL | Status: DC

## 2013-06-22 ENCOUNTER — Other Ambulatory Visit (HOSPITAL_BASED_OUTPATIENT_CLINIC_OR_DEPARTMENT_OTHER): Payer: Medicare HMO

## 2013-06-22 ENCOUNTER — Telehealth: Payer: Self-pay | Admitting: *Deleted

## 2013-06-22 ENCOUNTER — Ambulatory Visit (HOSPITAL_COMMUNITY): Admission: RE | Admit: 2013-06-22 | Payer: Medicare HMO | Source: Ambulatory Visit

## 2013-06-22 DIAGNOSIS — Z85118 Personal history of other malignant neoplasm of bronchus and lung: Secondary | ICD-10-CM

## 2013-06-22 LAB — COMPREHENSIVE METABOLIC PANEL (CC13)
ALT: 8 U/L (ref 0–55)
AST: 15 U/L (ref 5–34)
Albumin: 3.5 g/dL (ref 3.5–5.0)
Alkaline Phosphatase: 65 U/L (ref 40–150)
Anion Gap: 10 mEq/L (ref 3–11)
BUN: 24.1 mg/dL (ref 7.0–26.0)
CALCIUM: 9.1 mg/dL (ref 8.4–10.4)
CHLORIDE: 104 meq/L (ref 98–109)
CO2: 29 mEq/L (ref 22–29)
CREATININE: 1.9 mg/dL — AB (ref 0.6–1.1)
Glucose: 99 mg/dl (ref 70–140)
POTASSIUM: 4.1 meq/L (ref 3.5–5.1)
SODIUM: 142 meq/L (ref 136–145)
Total Bilirubin: 0.52 mg/dL (ref 0.20–1.20)
Total Protein: 7 g/dL (ref 6.4–8.3)

## 2013-06-22 LAB — CBC WITH DIFFERENTIAL/PLATELET
BASO%: 1 % (ref 0.0–2.0)
Basophils Absolute: 0.1 10*3/uL (ref 0.0–0.1)
EOS%: 8.4 % — AB (ref 0.0–7.0)
Eosinophils Absolute: 0.5 10*3/uL (ref 0.0–0.5)
HCT: 40.3 % (ref 34.8–46.6)
HGB: 13.2 g/dL (ref 11.6–15.9)
LYMPH#: 0.7 10*3/uL — AB (ref 0.9–3.3)
LYMPH%: 12 % — ABNORMAL LOW (ref 14.0–49.7)
MCH: 27.4 pg (ref 25.1–34.0)
MCHC: 32.7 g/dL (ref 31.5–36.0)
MCV: 83.7 fL (ref 79.5–101.0)
MONO#: 0.6 10*3/uL (ref 0.1–0.9)
MONO%: 10.4 % (ref 0.0–14.0)
NEUT#: 4.1 10*3/uL (ref 1.5–6.5)
NEUT%: 68.2 % (ref 38.4–76.8)
Platelets: 185 10*3/uL (ref 145–400)
RBC: 4.82 10*6/uL (ref 3.70–5.45)
RDW: 13.9 % (ref 11.2–14.5)
WBC: 6 10*3/uL (ref 3.9–10.3)

## 2013-06-22 NOTE — Telephone Encounter (Signed)
Pt's CT scan was cancelled due to insurance changes.  Called and spoke to patient that appts have been cancelled but that once we get it approved, appts can be r/s.  Pt knows to call once she gets a new CT scan appt and we can r/s her f/u appt.  SLJ

## 2013-06-23 ENCOUNTER — Telehealth: Payer: Self-pay | Admitting: Internal Medicine

## 2013-06-23 ENCOUNTER — Ambulatory Visit (HOSPITAL_COMMUNITY)
Admission: RE | Admit: 2013-06-23 | Discharge: 2013-06-23 | Disposition: A | Discharge status: Home / Self Care | Payer: Medicare HMO | Source: Ambulatory Visit | Attending: Internal Medicine | Admitting: Internal Medicine

## 2013-06-23 DIAGNOSIS — J90 Pleural effusion, not elsewhere classified: Secondary | ICD-10-CM | POA: Insufficient documentation

## 2013-06-23 DIAGNOSIS — K5730 Diverticulosis of large intestine without perforation or abscess without bleeding: Secondary | ICD-10-CM | POA: Insufficient documentation

## 2013-06-23 DIAGNOSIS — R911 Solitary pulmonary nodule: Secondary | ICD-10-CM | POA: Insufficient documentation

## 2013-06-23 DIAGNOSIS — C3490 Malignant neoplasm of unspecified part of unspecified bronchus or lung: Secondary | ICD-10-CM | POA: Insufficient documentation

## 2013-06-23 DIAGNOSIS — I2510 Atherosclerotic heart disease of native coronary artery without angina pectoris: Secondary | ICD-10-CM | POA: Insufficient documentation

## 2013-06-23 NOTE — Telephone Encounter (Signed)
pt dtr came by to get ct prep and r/s f/u appt. ct will be done today @ 6pm and dtr given new appt for 1/26 @ 3:15pm

## 2013-06-24 ENCOUNTER — Ambulatory Visit: Payer: Medicare Other | Admitting: Internal Medicine

## 2013-06-28 ENCOUNTER — Ambulatory Visit (HOSPITAL_BASED_OUTPATIENT_CLINIC_OR_DEPARTMENT_OTHER): Payer: Medicare HMO | Admitting: Internal Medicine

## 2013-06-28 ENCOUNTER — Telehealth: Payer: Self-pay | Admitting: Internal Medicine

## 2013-06-28 ENCOUNTER — Encounter: Payer: Self-pay | Admitting: Internal Medicine

## 2013-06-28 VITALS — BP 193/83 | HR 76 | Temp 97.7°F | Resp 18 | Ht 68.0 in | Wt 155.6 lb

## 2013-06-28 DIAGNOSIS — C3490 Malignant neoplasm of unspecified part of unspecified bronchus or lung: Secondary | ICD-10-CM

## 2013-06-28 DIAGNOSIS — Z85118 Personal history of other malignant neoplasm of bronchus and lung: Secondary | ICD-10-CM

## 2013-06-28 DIAGNOSIS — I10 Essential (primary) hypertension: Secondary | ICD-10-CM

## 2013-06-28 DIAGNOSIS — N189 Chronic kidney disease, unspecified: Secondary | ICD-10-CM

## 2013-06-28 MED ORDER — CLONIDINE HCL 0.1 MG PO TABS
0.2000 mg | ORAL_TABLET | ORAL | Status: AC
Start: 2013-06-28 — End: 2013-06-28
  Administered 2013-06-28: 0.2 mg via ORAL

## 2013-06-28 MED ORDER — CLONIDINE HCL 0.1 MG PO TABS
ORAL_TABLET | ORAL | Status: AC
  Filled 2013-06-28: qty 2

## 2013-06-28 NOTE — Progress Notes (Signed)
[name] [phone]   Fax:([phone]) Redkey Williford, MD  [place] [address]  PRINCIPAL DIAGNOSIS: Stage IV non-small cell lung cancer diagnosed in March 2007.   PRIOR THERAPY: Status post 6 cycles of systemic chemotherapy with carboplatin and docetaxel. Last dose was given January 16, 2006.   CURRENT THERAPY: Observation.  DISEASE STAGE: Stage IV non-small cell lung cancer diagnosed in March of 2007.  CHEMOTHERAPY INTENT: Palliative  CURRENT # OF CHEMOTHERAPY CYCLES: 0  CURRENT ANTIEMETICS: None  CURRENT SMOKING STATUS: Nonsmoker  ORAL CHEMOTHERAPY AND CONSENT: None  CURRENT BISPHOSPHONATES USE: None  LIVING WILL AND CODE STATUS: Full code  INTERVAL HISTORY: [name] 80 y.o. female returns to the clinic today for six-month followup visit accompanied by her daughter. The patient is feeling fine today with no specific complaints. She denied having any significant chest pain, but has shortness of breath with exertion, no cough or hemoptysis. The patient denied having any weight loss or night sweats. She had repeat CT scan of the chest, abdomen and pelvis performed recently and she is here for evaluation and discussion of her scan results.  MEDICAL HISTORY: Past Medical History  Diagnosis Date  . Non-small cell carcinoma of lung     Stage IV  . Hypertension   . Hypercholesterolemia   . CKD (chronic kidney disease)   . Coronary artery disease     s/p PCI RCA  December 2006  . GERD (gastroesophageal reflux disease)   . Chronic anemia     ALLERGIES:  is allergic to amlodipine; aspirin; cephalexin; ciprofloxacin; hydralazine; iron; penicillins; statins; and sulfonamide derivatives.  MEDICATIONS:  Current Outpatient Prescriptions  Medication Sig Dispense Refill  . carvedilol (COREG) 25 MG tablet Take 1 tablet (25 mg total) by mouth 2 (two) times daily with a meal.  180 tablet  3  .  clopidogrel (PLAVIX) 75 MG tablet TAKE ONE TABLET DAILY.  30 tablet  6  . furosemide (LASIX) 40 MG tablet Take 1 tablet (40 mg total) by mouth daily as needed.  90 tablet  3  . pantoprazole (PROTONIX) 40 MG tablet TAKE ONE TABLET BY MOUTH EVERY DAY  30 tablet  0  . traMADol (ULTRAM) 50 MG tablet prn       No current facility-administered medications for this visit.    SURGICAL HISTORY:  Past Surgical History  Procedure Laterality Date  . Hematoma evacuation  December 2006    groin  . Appendectomy    . Cataract extraction    . Hemorroidectomy    . Laminectomy      REVIEW OF SYSTEMS:  A comprehensive review of systems was negative.   PHYSICAL EXAMINATION: General appearance: alert, cooperative and no distress Head: Normocephalic, without obvious abnormality, atraumatic Neck: no adenopathy Lymph nodes: Cervical, supraclavicular, and axillary nodes normal. Resp: clear to auscultation bilaterally Cardio: regular rate and rhythm, S1, S2 normal, no murmur, click, rub or gallop GI: soft, non-tender; bowel sounds normal; no masses,  no organomegaly Extremities: extremities normal, atraumatic, no cyanosis or edema  ECOG PERFORMANCE STATUS: 1 - Symptomatic but completely ambulatory  Blood pressure 189/89, pulse 76, temperature 97.7 F (36.5 C), temperature source Oral, resp. rate 18, height 5\' 8"  (1.727 m), weight 155 lb 9.6 oz (70.58 kg), SpO2 95.00%.  LABORATORY DATA: Lab Results  Component Value Date   WBC 6.0 06/22/2013   HGB 13.2 06/22/2013   HCT 40.3 06/22/2013  MCV 83.7 06/22/2013   PLT 185 06/22/2013      Chemistry      Component Value Date/Time   NA 142 06/22/2013 0921   NA 138 10/30/2012 1054   NA 143 12/23/2011 0830   K 4.1 06/22/2013 0921   K 4.0 10/30/2012 1054   K 4.7 12/23/2011 0830   CL 106 10/30/2012 1054   CL 104 06/19/2012 0958   CL 101 12/23/2011 0830   CO2 29 06/22/2013 0921   CO2 28 10/30/2012 1054   CO2 27 12/23/2011 0830   BUN 24.1 06/22/2013 0921   BUN 33*  10/30/2012 1054   BUN 25* 12/23/2011 0830   CREATININE 1.9* 06/22/2013 0921   CREATININE 2.2* 10/30/2012 1054   CREATININE 2.0* 12/23/2011 0830      Component Value Date/Time   CALCIUM 9.1 06/22/2013 0921   CALCIUM 8.9 10/30/2012 1054   CALCIUM 9.0 12/23/2011 0830   ALKPHOS 65 06/22/2013 0921   ALKPHOS 76 12/23/2011 0830   ALKPHOS 91 04/08/2011 1023   AST 15 06/22/2013 0921   AST 16 12/23/2011 0830   AST 17 04/08/2011 1023   ALT 8 06/22/2013 0921   ALT 16 12/23/2011 0830   ALT 10 04/08/2011 1023   BILITOT 0.52 06/22/2013 0921   BILITOT 0.60 12/23/2011 0830   BILITOT 0.4 04/08/2011 1023       RADIOGRAPHIC STUDIES:  Ct Chest Wo Contrast  06/24/2013   CLINICAL DATA:  Lung cancer  EXAM: CT CHEST, ABDOMEN AND PELVIS WITHOUT CONTRAST  TECHNIQUE: Multidetector CT imaging of the chest, abdomen and pelvis was performed following the standard protocol without IV contrast.  COMPARISON:  CT ABD/PELV WO CM dated 12/22/2012  FINDINGS:   CT CHEST FINDINGS  No axillary or supraclavicular lymphadenopathy. No mediastinal hilar lymphadenopathy. Small paratracheal lymph nodes are less than 10 mm short axis and are unchanged from prior. No pericardial fluid. Coronary calcifications are noted.  Review of the lung parenchyma again demonstrates a peripheral spiculated nodule in the left upper lobe measuring 11 x 10 mm compared to 11 x 10 mm on prior for no change. No additional pulmonary nodules. There is small right effusion slightly increased in volume.    CT ABDOMEN AND PELVIS FINDINGS  No focal hepatic lesion on this noncontrast exam. The gallbladder is absent. The pancreas, spleen, adrenal glands kidneys are unchanged. There several exophytic renal lesions not characterized without without IV contrast  The stomach, small bowel, cecum normal. The colon and rectosigmoid colon are normal.  Abdominal or is normal caliber. No retroperitoneal periportal lymphadenopathy.  No free fluid the pelvis. No pelvic lymphadenopathy. Post  hysterectomy anatomy. No aggressive osseous lesion.    IMPRESSION: 1. Stable spiculated nodule in the left upper lobe. 2. No mediastinal lymphadenopathy. 3. Slight increase in volume of small right pleural effusion. 4. No evidence metastasis in the abdomen or pelvis on this noncontrast exam. 5. Diverticulosis.   Electronically Signed   By: Suzy Bouchard M.D.   On: 06/24/2013 09:17   ASSESSMENT AND PLAN: This is a very pleasant 80 years old white female with history of stage IV non-small cell lung cancer diagnosed in March of 2007 status post 6 cycles of systemic chemotherapy with carboplatin and docetaxel and has been observation since that time with no evidence for disease progression. I discussed the scan results with the patient and her daughter.  I recommended for her to continue on observation with repeat CT scan of the chest without contrast in 6 months. The  patient was advised to call immediately if she has any concerning symptoms in the interval.  All questions were answered. The patient knows to call the clinic with any problems, questions or concerns. We can certainly see the patient much sooner if necessary.

## 2013-06-28 NOTE — Patient Instructions (Signed)
Followup visit in 6 months with repeat CT scan of the chest without contrast.

## 2013-06-28 NOTE — Telephone Encounter (Signed)
gv and printed appt sched and avs for pt for July adn Aug

## 2013-06-29 ENCOUNTER — Encounter: Payer: Self-pay | Admitting: Cardiovascular Disease

## 2013-06-29 ENCOUNTER — Ambulatory Visit (INDEPENDENT_AMBULATORY_CARE_PROVIDER_SITE_OTHER): Payer: Medicare HMO | Admitting: Cardiovascular Disease

## 2013-06-29 VITALS — BP 142/88 | HR 63 | Ht 68.0 in | Wt 156.0 lb

## 2013-06-29 DIAGNOSIS — R0602 Shortness of breath: Secondary | ICD-10-CM

## 2013-06-29 MED ORDER — CLONIDINE HCL 0.1 MG PO TABS: 0.1000 mg | ORAL_TABLET | Freq: Every day | ORAL | Status: DC

## 2013-06-29 NOTE — Progress Notes (Signed)
    HPI:  80 year old woman presenting for cardiology followup evaluation. The patient has coronary artery disease and she has undergone coronary stenting in 2007 when she was treated with a bare-metal stent in the right coronary artery. She also has hypertension and chronic kidney disease. The patient has stage IV non-small cell lung cancer and has been treated since 2007 with palliative chemotherapy. Remarkably her cancer has been stable since that time. She's been treated with Plavix for her coronary disease as she is aspirin allergic. The patient's left ventricular ejection fraction is been in the range of 40-45%. Her last lab work in January 2015 showed stable renal function with a creatinine of 1.9 mg/dL.  She's been having difficulty with elevated blood pressure. Her blood pressure yesterday was about 190 mm mercury. She was given clonidine 0.2 mg and blood pressure improved dramatically. She did have fatigue with this. She felt well this morning. She denies chest pain. She denies  orthopnea or PND. She complains of mild leg swelling but this has responded well to support hose. She's been intolerant to multiple antihypertensive medications including amlodipine, furosemide, and hydralazine.   Outpatient Encounter Prescriptions as of 06/29/2013  Medication Sig  . carvedilol (COREG) 25 MG tablet Take 1 tablet (25 mg total) by mouth 2 (two) times daily with a meal.  . clopidogrel (PLAVIX) 75 MG tablet TAKE ONE TABLET DAILY.  . pantoprazole (PROTONIX) 40 MG tablet TAKE ONE TABLET BY MOUTH EVERY DAY  . traMADol (ULTRAM) 50 MG tablet Take 50 mg by mouth every 6 (six) hours as needed. prn    Allergies  Allergen Reactions  . Amlodipine Swelling  . Aspirin   . Cephalexin   . Ciprofloxacin     REACTION: weakness  . Hydralazine   . Iron   . Penicillins   . Statins   . Sulfonamide Derivatives     Past Medical History  Diagnosis Date  . Non-small cell carcinoma of lung     Stage IV  .  Hypertension   . Hypercholesterolemia   . CKD (chronic kidney disease)   . Coronary artery disease     s/p PCI RCA  December 2006  . GERD (gastroesophageal reflux disease)   . Chronic anemia     ROS: Negative except as per HPI  BP 142/88  Pulse 63  Ht 5\' 8"  (1.727 m)  Wt 156 lb (70.761 kg)  BMI 23.73 kg/m2  SpO2 96%  PHYSICAL EXAM: Pt is alert and oriented, NAD HEENT: normal Neck: JVP - normal, carotids 2+= without bruits Lungs: CTA bilaterally CV: RRR without murmur or gallop Abd: soft, NT, Positive BS, no hepatomegaly Ext: no C/C/E, distal pulses intact and equal Skin: warm/dry no rash  ASSESSMENT AND PLAN: 1. Hypertension with suboptimal control. Limited options because of chronic kidney disease and multiple drug intolerances. Will try to have clonidine 0.1 mg at bedtime. Will titrate as needed. I'll see her back in 4 months for followup.  2. Acute on chronic shortness of breath. Noted to have mild LV dysfunction in the past. I'm not confident this is cardiac related. However, will check an echocardiogram to evaluate for progressive LV dysfunction or signs of significant diastolic dysfunction.  3. Coronary atherosclerosis, native vessel. She continues on clopidogrel because of an aspirin allergy. She is statin intolerant.  Sherren Mocha 06/29/2013 2:45 PM

## 2013-06-29 NOTE — Patient Instructions (Signed)
Your physician has recommended you make the following change in your medication:  START Clonidine 0.1 mg at bedtime  Your physician has requested that you have an echocardiogram. Echocardiography is a painless test that uses sound waves to create images of your heart. It provides your doctor with information about the size and shape of your heart and how well your heart's chambers and valves are working. This procedure takes approximately one hour. There are no restrictions for this procedure.  Your physician wants you to follow-up in: 6 months with Dr. Burt Knack.  You will receive a reminder letter in the mail two months in advance. If you don't receive a letter, please call our office to schedule the follow-up appointment.

## 2013-07-05 ENCOUNTER — Telehealth: Payer: Self-pay | Admitting: Cardiovascular Disease

## 2013-07-05 NOTE — Telephone Encounter (Signed)
LEFT MESSAGE THAT  DR  Burt Knack  HAS  NOT  REVIEWED  MESSAGE A THIS TIME .[name]

## 2013-07-05 NOTE — Telephone Encounter (Signed)
Patient called back in, she is very mad that no one has called her back. Please call and advise.

## 2013-07-05 NOTE — Telephone Encounter (Signed)
Follow up    Patient calling back to speak with nurse . C/O itching from clonidine. Should she keep take. The medication is wonderful she feels good, has energy,

## 2013-07-05 NOTE — Telephone Encounter (Signed)
New message  Patient was given Clondine for BP, it is working great for her BP. But she is broke out in whelps and itching. Please call and advise.

## 2013-07-05 NOTE — Telephone Encounter (Signed)
Pt started taking clonidine 0.1 at bedtime on 1/27.  She feels wonderful on it with lots of energy. She has broken out in "whelps" that are very itchy on chest, under arms, back.  She has been using topical benadryl which helps. She wants to take medication anyway because she feels so much better than being on coreg--but is it safe to continue? Will send a message to Dr. Burt Knack to advise, and informed patient I will call her back today.

## 2013-07-05 NOTE — Telephone Encounter (Signed)
PT  CALLED ONCE  AGAIN INFORMED PT  THAT  DR COOPER  HAS   NOT  GIVEN ANY  NEW  INSTRUCTIONS  AT THIS  TIME  WILL CALL ONCE  MESSAGE  ADDRESSED./CY

## 2013-07-06 NOTE — Telephone Encounter (Signed)
Follow up    Patient calling back to speak with Altha Harm .

## 2013-07-06 NOTE — Telephone Encounter (Signed)
Ok to continue for now and could also take oral benadryl as needed. If rash persists will need to stop clonidine. I suspect she is allergic. Have her call if this persists into next week. thx

## 2013-07-06 NOTE — Telephone Encounter (Signed)
LMTCB ./CY 

## 2013-07-06 NOTE — Telephone Encounter (Signed)
PT  NOTIFIED  OF  DR  COOPER'S    RECOMMENDATIONS .Adonis Housekeeper

## 2013-07-07 NOTE — Telephone Encounter (Signed)
Spoke with patient who states Clonidine has really helped her to feel so much better but she has had itching and hives since starting the medication.  Patient states she does not have any itching or hives today.  Patient states her last dose of Benadryl was last night.  I advised patient to notify me if she continues to experience adverse side effects.  Patient states she will take Clonidine tonight and will let us know if she has any side effects from it.  I advised patient that I will be in the office today, tomorrow and Friday.  Patient thanked me for the call.

## 2013-07-07 NOTE — Telephone Encounter (Signed)
If she has to stop clonidine due to allergy and skin rash, could try hydralazine 25 mg 3 times daily. She is allergic to amlodipine, already on a beta blocker, and cannot take ACE or ARB because of significant CKD.

## 2013-07-23 ENCOUNTER — Other Ambulatory Visit: Payer: Self-pay | Admitting: *Deleted

## 2013-07-23 ENCOUNTER — Telehealth: Payer: Self-pay | Admitting: *Deleted

## 2013-07-23 MED ORDER — CLONIDINE HCL 0.1 MG PO TABS: 0.1000 mg | ORAL_TABLET | Freq: Every day | ORAL | Status: DC

## 2013-07-23 MED ORDER — PANTOPRAZOLE SODIUM 40 MG PO TBEC: DELAYED_RELEASE_TABLET | ORAL | Status: DC

## 2013-07-23 NOTE — Telephone Encounter (Signed)
Patient called requesting to speak to you about the side effects from the clonidine. She stated that they have subsided and she wishes to remain on it. She would like a 90 day supply sent to [facility], but I was not sure if ok. Please advise. Thanks, MI

## 2013-07-23 NOTE — Telephone Encounter (Signed)
Rx sent in

## 2013-07-23 NOTE — Telephone Encounter (Signed)
Yes, you may fill.  Patient is intolerant of a lot of other medications and Dr. Burt Knack was hopeful that she would be able to remain on the Clonidine. Thank you.

## 2013-07-26 ENCOUNTER — Ambulatory Visit (HOSPITAL_COMMUNITY): Payer: Medicare HMO | Attending: Cardiovascular Disease | Admitting: Radiology

## 2013-07-26 DIAGNOSIS — I10 Essential (primary) hypertension: Secondary | ICD-10-CM | POA: Insufficient documentation

## 2013-07-26 DIAGNOSIS — R0609 Other forms of dyspnea: Secondary | ICD-10-CM | POA: Insufficient documentation

## 2013-07-26 DIAGNOSIS — I059 Rheumatic mitral valve disease, unspecified: Secondary | ICD-10-CM | POA: Insufficient documentation

## 2013-07-26 DIAGNOSIS — R0602 Shortness of breath: Secondary | ICD-10-CM | POA: Insufficient documentation

## 2013-07-26 DIAGNOSIS — R0989 Other specified symptoms and signs involving the circulatory and respiratory systems: Secondary | ICD-10-CM | POA: Insufficient documentation

## 2013-07-26 DIAGNOSIS — C3490 Malignant neoplasm of unspecified part of unspecified bronchus or lung: Secondary | ICD-10-CM | POA: Insufficient documentation

## 2013-07-26 DIAGNOSIS — Z9221 Personal history of antineoplastic chemotherapy: Secondary | ICD-10-CM | POA: Insufficient documentation

## 2013-07-26 DIAGNOSIS — I2510 Atherosclerotic heart disease of native coronary artery without angina pectoris: Secondary | ICD-10-CM | POA: Insufficient documentation

## 2013-07-26 NOTE — Progress Notes (Signed)
Echocardiogram performed.  

## 2013-08-02 ENCOUNTER — Telehealth: Payer: Self-pay | Admitting: Cardiovascular Disease

## 2013-08-02 NOTE — Telephone Encounter (Signed)
New Message  Pt called, requesting a call back to discuss ECHO results.. Please call

## 2013-08-03 NOTE — Telephone Encounter (Signed)
Patient called again today asking for echo results. I gave her prelim results and advised that she would get a call back after Dr.Cooper looks at echo report. Note sent to Dr.Cooper to look at results.

## 2013-08-05 MED ORDER — FUROSEMIDE 40 MG PO TABS: 40.0000 mg | ORAL_TABLET | Freq: Every day | ORAL | Status: DC

## 2013-08-05 NOTE — Telephone Encounter (Signed)
Dr. Burt Knack reviewed results of echo with patient and advised patient to take Lasix 40 mg daily.  Rx sent to patient's pharmacy and patient aware that I will call her next week to schedule an appointment.

## 2013-08-09 ENCOUNTER — Telehealth: Payer: Self-pay | Admitting: Cardiovascular Disease

## 2013-08-09 NOTE — Telephone Encounter (Signed)
Follow Up  Pt returned call.. Offered to schedule the appt with Pt per notes// Pt declined and requests to speak with the nurse first//Please call

## 2013-08-09 NOTE — Telephone Encounter (Signed)
Spoke with patient and reviewed Dr. Antionette Char advice to schedule an appointment in 2 weeks with PA/NP.  Patient scheduled with Richardson Dopp, PA-C on 3/24 and advised to begin Lasix 40 mg daily.  Patient states her pharmacy said they did not have the medication request from [place].  I advised patient that I sent it on 3/5 and to call back if there is a problem.  Patient verbalized understanding and agreement.

## 2013-08-09 NOTE — Telephone Encounter (Signed)
New message ° ° °Patient returning call back to nurse.  °

## 2013-08-24 ENCOUNTER — Encounter: Payer: Self-pay | Admitting: Physician Assistant

## 2013-08-24 ENCOUNTER — Ambulatory Visit (INDEPENDENT_AMBULATORY_CARE_PROVIDER_SITE_OTHER): Payer: Medicare HMO | Admitting: Physician Assistant

## 2013-08-24 ENCOUNTER — Telehealth: Payer: Self-pay | Admitting: *Deleted

## 2013-08-24 VITALS — BP 144/90 | HR 70 | Ht 68.0 in | Wt 152.0 lb

## 2013-08-24 DIAGNOSIS — I2510 Atherosclerotic heart disease of native coronary artery without angina pectoris: Secondary | ICD-10-CM

## 2013-08-24 DIAGNOSIS — N189 Chronic kidney disease, unspecified: Secondary | ICD-10-CM

## 2013-08-24 DIAGNOSIS — I10 Essential (primary) hypertension: Secondary | ICD-10-CM

## 2013-08-24 DIAGNOSIS — N183 Chronic kidney disease, stage 3 unspecified: Secondary | ICD-10-CM

## 2013-08-24 DIAGNOSIS — M109 Gout, unspecified: Secondary | ICD-10-CM

## 2013-08-24 DIAGNOSIS — I255 Ischemic cardiomyopathy: Secondary | ICD-10-CM

## 2013-08-24 DIAGNOSIS — I5022 Chronic systolic (congestive) heart failure: Secondary | ICD-10-CM

## 2013-08-24 DIAGNOSIS — I2589 Other forms of chronic ischemic heart disease: Secondary | ICD-10-CM

## 2013-08-24 LAB — BASIC METABOLIC PANEL
BUN: 38 mg/dL — AB (ref 6–23)
CALCIUM: 9.3 mg/dL (ref 8.4–10.5)
CHLORIDE: 97 meq/L (ref 96–112)
CO2: 32 meq/L (ref 19–32)
CREATININE: 3.1 mg/dL — AB (ref 0.4–1.2)
GFR: 15.68 mL/min — ABNORMAL LOW (ref >60.00)
Glucose, Bld: 93 mg/dL (ref 70–99)
Potassium: 3.4 mEq/L — ABNORMAL LOW (ref 3.5–5.1)
Sodium: 139 mEq/L (ref 135–145)

## 2013-08-24 LAB — URIC ACID: Uric Acid, Serum: 11.6 mg/dL — ABNORMAL HIGH (ref 2.4–7.0)

## 2013-08-24 MED ORDER — METOPROLOL SUCCINATE ER 25 MG PO TB24: 25.0000 mg | ORAL_TABLET | Freq: Every day | ORAL | Status: DC

## 2013-08-24 NOTE — Patient Instructions (Signed)
LAB WORK TODAY; BMET, URIC ACID LEVEL  START TOPROL XL 25 MG 1 TABLET DAILY RX SENT IN   YOU HAVE A FOLLOW UP SCOTT WEAVER, [place] 10/07/13 @ 2 pm SAME DAY DR. Burt Knack IS IN THE OFFICE

## 2013-08-24 NOTE — Progress Notes (Signed)
[place], [address] [place], [place]  [address] Phone: [phone] Fax:  [phone]  Date:  08/24/2013   ID:  [name], DOB 1934/01/06, MRN [id number]  PCP:  Delphina Cahill, MD  Cardiologist:  Dr. Sherren Mocha    History of Present Illness: [name] is a 80 y.o. female with a  history of CAD, status post BMS to the RCA in 2007, ischemic cardiomyopathy, HTN, CKD, stage IV non-small cell lung CA treated since 2007 with palliative chemotherapy.  EF has been 40-45% in the past. She has difficult to control hypertension. She has been intolerant to multiple medications including amlodipine, furosemide and hydralazine. Last seen by Dr. Burt Knack 06/29/13. She was placed on clonidine 0.1 mg each bedtime. She was more dyspneic.  Echocardiogram was obtained which demonstrated worsening LV function with an EF of 35-40% and elevated filling pressures.  Patient was placed on Lasix. She is brought back for further medication adjustments.    Her breathing is improved since last seen. She denies chest pain. She denies orthopnea, PND. She has mild pedal edema without significant change. She denies chest pain or syncope.  Studies:  - LHC (06/04/05):  LHC done in [place] with high grade RCA => s/p BMS to RCA  - Echo (07/26/13):  Mild LVH, EF 35-40%, diff HK, inf AK, Gr 2 DD, Tr AI, mildly dilated Ao root, MAC, mild MR, mild LAE, mod reduced RVSF.    - Nuclear (09/14/09):  Lexiscan;  Inf infarct with mild peri-infarct ishemia, EF 52%; Low Risk.   Recent Labs: 06/22/2013: ALT 8; Creatinine 1.9*; Hemoglobin 13.2; Potassium 4.1   Wt Readings from Last 3 Encounters:  08/24/13 152 lb (68.947 kg)  06/29/13 156 lb (70.761 kg)  06/28/13 155 lb 9.6 oz (70.58 kg)     Past Medical History  Diagnosis Date  . Non-small cell carcinoma of lung     Stage IV  . Hypertension   . Hypercholesterolemia   . CKD (chronic kidney disease)   . Coronary artery disease     s/p PCI RCA  December 2006  .  GERD (gastroesophageal reflux disease)   . Chronic anemia     Current Outpatient Prescriptions  Medication Sig Dispense Refill  . cloNIDine (CATAPRES) 0.1 MG tablet Take 1 tablet (0.1 mg total) by mouth daily.  90 tablet  1  . clopidogrel (PLAVIX) 75 MG tablet TAKE ONE TABLET DAILY.  30 tablet  6  . furosemide (LASIX) 40 MG tablet Take 1 tablet (40 mg total) by mouth daily.  90 tablet  3  . pantoprazole (PROTONIX) 40 MG tablet TAKE ONE TABLET BY MOUTH EVERY DAY  90 tablet  1  . traMADol (ULTRAM) 50 MG tablet Take 50 mg by mouth every 6 (six) hours as needed. prn       No current facility-administered medications for this visit.    Allergies:   Amlodipine; Aspirin; Cephalexin; Ciprofloxacin; Hydralazine; Iron; Penicillins; Statins; and Sulfonamide derivatives   Social History:  The patient  reports that she has quit smoking. Her smoking use included Cigarettes. She smoked 0.00 packs per day. She does not have any smokeless tobacco history on file. She reports that she does not drink alcohol.   Family History:  The patient's family history includes Heart failure in her mother; Lung cancer in her brother and sister; Stomach cancer in her brother.   ROS:  Please see the history of present illness.   She is having difficulty with gout  in her left foot.   All other systems reviewed and negative.   PHYSICAL EXAM: VS:  BP 144/90  Pulse 70  Ht 5\' 8"  (1.727 m)  Wt 152 lb (68.947 kg)  BMI 23.12 kg/m2 Well nourished, well developed, in no acute distress HEENT: normal Neck: no JVD Cardiac:  normal S1, S2; RRR; no murmur Lungs:  clear to auscultation bilaterally, no wheezing, rhonchi or rales Abd: soft, nontender, no hepatomegaly Ext: no edema Skin: warm and dry Neuro:  CNs 2-12 intact, no focal abnormalities noted  EKG:  NSR, HR 70, normal axis, inferolateral T wave inversions, no significant change when compared to prior tracings     ASSESSMENT AND PLAN:  1. Hypertension:  Better  control. Her blood pressures at home average 140s over 70s. I will add Toprol-XL 25 mg daily. 2. Chronic Systolic CHF:  Volume improved. Continue current therapy. Check a followup basic metabolic panel today. 3. Chronic Kidney Disease: Obtain follow up basic metabolic panel today. 4. Ischemic Cardiomyopathy:  She is not on ACEI secondary to CKD. She has been intolerant to Coreg. We will try to place her on Toprol-XL 25 mg daily as noted above. 5. CAD:  No angina. She is on Plavix as she has an allergy to aspirin. She is intolerant to statins. 6. Gout: I will obtain a uric acid level for her today and send it to her PCP. 7. Disposition:  Follow up with me in 4-6 weeks.  Signed, Richardson Dopp, PA-C  08/24/2013 2:57 PM

## 2013-08-24 NOTE — Telephone Encounter (Signed)
I called the home # and girl answered and said pt was not home yet. She then gave me pt's cell # [phone] which I have added to pt's chart today. I called cell # but could not lm. I tried work # but busy signal. I w/cb tomorrow.

## 2013-08-25 MED ORDER — FUROSEMIDE 40 MG PO TABS: 20.0000 mg | ORAL_TABLET | Freq: Every day | ORAL | Status: DC

## 2013-08-25 NOTE — Telephone Encounter (Signed)
pt notified about lab results and to hold lasix x 3 days then resume lasix @ 20 mg on Mon, Wed, and Fri, bmet Friday 3/27. Increase dietary K+ x 2 days. Will fax results to PCP to f/u on gout. Pt advised to call if wt up 3 lb x 1 day. pt said thank you.

## 2013-08-27 ENCOUNTER — Other Ambulatory Visit (INDEPENDENT_AMBULATORY_CARE_PROVIDER_SITE_OTHER): Payer: Medicare HMO

## 2013-08-27 ENCOUNTER — Other Ambulatory Visit: Payer: Self-pay | Admitting: *Deleted

## 2013-08-27 DIAGNOSIS — I10 Essential (primary) hypertension: Secondary | ICD-10-CM

## 2013-08-27 DIAGNOSIS — N183 Chronic kidney disease, stage 3 unspecified: Secondary | ICD-10-CM

## 2013-08-27 LAB — BASIC METABOLIC PANEL
BUN: 35 mg/dL — AB (ref 6–23)
CO2: 31 meq/L (ref 19–32)
CREATININE: 2.4 mg/dL — AB (ref 0.4–1.2)
Calcium: 9 mg/dL (ref 8.4–10.5)
Chloride: 100 mEq/L (ref 96–112)
GFR: 20.45 mL/min — AB (ref >60.00)
GLUCOSE: 96 mg/dL (ref 70–99)
Potassium: 3.5 mEq/L (ref 3.5–5.1)
SODIUM: 137 meq/L (ref 135–145)

## 2013-09-03 ENCOUNTER — Telehealth: Payer: Self-pay | Admitting: *Deleted

## 2013-09-03 ENCOUNTER — Ambulatory Visit (INDEPENDENT_AMBULATORY_CARE_PROVIDER_SITE_OTHER): Payer: Medicare HMO | Admitting: *Deleted

## 2013-09-03 DIAGNOSIS — I10 Essential (primary) hypertension: Secondary | ICD-10-CM

## 2013-09-03 LAB — BASIC METABOLIC PANEL
BUN: 32 mg/dL — AB (ref 6–23)
CALCIUM: 9.3 mg/dL (ref 8.4–10.5)
CHLORIDE: 101 meq/L (ref 96–112)
CO2: 27 mEq/L (ref 19–32)
Creat: 2.22 mg/dL — ABNORMAL HIGH (ref 0.50–1.10)
Glucose, Bld: 92 mg/dL (ref 70–99)
Potassium: 4 mEq/L (ref 3.5–5.3)
Sodium: 140 mEq/L (ref 135–145)

## 2013-09-03 NOTE — Telephone Encounter (Signed)
pt nofied about lab results with verbal understanding

## 2013-10-05 ENCOUNTER — Telehealth: Payer: Self-pay | Admitting: Physician Assistant

## 2013-10-05 NOTE — Telephone Encounter (Signed)
I lvm with pt's daughter today that I was calling pt back about her appt 10/07/13 with Richardson Dopp, PAC at 2 pm.  Pt saw Brynda Rim. PA on 08/24/13. Before pt left that day she was scheduled for a follow up which was typed out on her AVS. I went over instructions on AVS per Evaro W. PA and then gave to the pt.

## 2013-10-05 NOTE — Telephone Encounter (Signed)
New Prob   Pt is calling regarding her appt on 5/7. States she was unaware of appt. Requesting to speak to someone regarding this. Pt did not want to cancel appt, however, she states she will not be coming.

## 2013-10-06 NOTE — Telephone Encounter (Signed)
lmptcb to d/w pt about her fatigue/clonidine.

## 2013-10-06 NOTE — Telephone Encounter (Signed)
lmptcb to d/w pt about fatigue and clonidine

## 2013-10-07 ENCOUNTER — Ambulatory Visit: Payer: Medicare HMO | Admitting: Physician Assistant

## 2013-10-07 MED ORDER — METOPROLOL SUCCINATE ER 25 MG PO TB24: 25.0000 mg | ORAL_TABLET | Freq: Every evening | ORAL | Status: DC

## 2013-10-07 NOTE — Telephone Encounter (Signed)
cb pt to go give recommendation from [place] about the clonidine and sleepiness during the day. Advised pt per Nicki Reaper he did not think it was the clonidine making her sleepy during the day however; try switching toprol xl to bed time. see if this help.

## 2013-10-21 ENCOUNTER — Ambulatory Visit: Payer: Medicare HMO | Admitting: Physician Assistant

## 2013-10-27 ENCOUNTER — Encounter: Payer: Self-pay | Admitting: Physician Assistant

## 2013-10-27 ENCOUNTER — Ambulatory Visit (INDEPENDENT_AMBULATORY_CARE_PROVIDER_SITE_OTHER): Payer: Medicare HMO | Admitting: Physician Assistant

## 2013-10-27 VITALS — BP 200/96 | HR 70 | Ht 68.0 in | Wt 155.0 lb

## 2013-10-27 DIAGNOSIS — I2589 Other forms of chronic ischemic heart disease: Secondary | ICD-10-CM

## 2013-10-27 DIAGNOSIS — N183 Chronic kidney disease, stage 3 unspecified: Secondary | ICD-10-CM

## 2013-10-27 DIAGNOSIS — I5022 Chronic systolic (congestive) heart failure: Secondary | ICD-10-CM

## 2013-10-27 DIAGNOSIS — I2510 Atherosclerotic heart disease of native coronary artery without angina pectoris: Secondary | ICD-10-CM

## 2013-10-27 DIAGNOSIS — I10 Essential (primary) hypertension: Secondary | ICD-10-CM

## 2013-10-27 DIAGNOSIS — I255 Ischemic cardiomyopathy: Secondary | ICD-10-CM

## 2013-10-27 MED ORDER — CLONIDINE HCL 0.1 MG PO TABS
0.1000 mg | ORAL_TABLET | ORAL | Status: DC
Start: 2013-10-27 — End: 2013-10-27

## 2013-10-27 MED ORDER — CLONIDINE HCL 0.1 MG PO TABS: 0.1000 mg | ORAL_TABLET | Freq: Two times a day (BID) | ORAL | Status: DC

## 2013-10-27 MED ORDER — CLONIDINE HCL 0.1 MG PO TABS
0.1000 mg | ORAL_TABLET | Freq: Once | ORAL | Status: AC
  Administered 2013-10-27: 0.1 mg via ORAL

## 2013-10-27 NOTE — Patient Instructions (Signed)
INCREASE CLONIDINE 0.1 MG TWICE DAILY; NEW RX SENT IN TODAY  FOLLOW UP WITH SCOTT WEAVER, [facility] 11/03/13 @ 3 PM SAME DAY DR. Burt Knack IS IN THE OFFICE.

## 2013-10-27 NOTE — Progress Notes (Signed)
Cardiology Office Note    Date:  10/27/2013   ID:  [name], DOB [birth year], MRN [id number]  PCP:  Delphina Cahill, MD  Cardiologist:  Dr. Sherren Mocha    History of Present Illness: [name] is a 80 y.o. female with a  history of CAD, status post BMS to the RCA in 2007, ischemic cardiomyopathy, HTN, CKD, stage IV non-small cell lung CA treated since 2007 with palliative chemotherapy.  EF has been 40-45% in the past. She has difficult to control hypertension. She has been intolerant to multiple medications including amlodipine, furosemide and hydralazine. Last seen by Dr. Burt Knack 06/29/13. She was placed on clonidine 0.1 mg each bedtime. She was more dyspneic.  Echocardiogram was obtained which demonstrated worsening LV function with an EF of 35-40% and elevated filling pressures.  Patient was placed on Lasix.  I saw her in follow up 08/24/13.  She was improved.  I added Toprol to her regimen for medical therapy of her cardiomyopathy.    She returns for follow up.  Her BP is markedly elevated today.  She is compliant with her medications.  She denies dietary indiscretion with salt.  She denies chest pain.  She has chronic DOE.  This is unchanged.  She is NYHA 2-2b.  She denies orthopnea, PND.  LE edema is stable.     Studies:  - LHC (06/04/05):  LHC done in [place] with high grade RCA => s/p BMS to RCA  - Echo (07/26/13):  Mild LVH, EF 35-40%, diff HK, inf AK, Gr 2 DD, Tr AI, mildly dilated Ao root, MAC, mild MR, mild LAE, mod reduced RVSF.    - Nuclear (09/14/09):  Lexiscan;  Inf infarct with mild peri-infarct ishemia, EF 52%; Low Risk.   Recent Labs: 06/22/2013: ALT 8; Hemoglobin 13.2  09/03/2013: Creatinine 2.22*; Potassium 4.0   Wt Readings from Last 3 Encounters:  10/27/13 155 lb (70.308 kg)  08/24/13 152 lb (68.947 kg)  06/29/13 156 lb (70.761 kg)     Past Medical History  Diagnosis Date  . Non-small cell carcinoma of lung     Stage IV  . Hypertension   .  Hypercholesterolemia   . CKD (chronic kidney disease)   . Coronary artery disease     s/p PCI RCA  December 2006  . GERD (gastroesophageal reflux disease)   . Chronic anemia     Current Outpatient Prescriptions  Medication Sig Dispense Refill  . cloNIDine (CATAPRES) 0.1 MG tablet Take 1 tablet (0.1 mg total) by mouth daily.  90 tablet  1  . clopidogrel (PLAVIX) 75 MG tablet TAKE ONE TABLET DAILY.  30 tablet  6  . furosemide (LASIX) 40 MG tablet Take 0.5 tablets (20 mg total) by mouth daily.      . metoprolol succinate (TOPROL-XL) 25 MG 24 hr tablet Take 1 tablet (25 mg total) by mouth every evening.      . pantoprazole (PROTONIX) 40 MG tablet TAKE ONE TABLET BY MOUTH EVERY DAY  90 tablet  1  . traMADol (ULTRAM) 50 MG tablet Take 50 mg by mouth every 6 (six) hours as needed. prn       No current facility-administered medications for this visit.    Allergies:   Amlodipine; Aspirin; Cephalexin; Ciprofloxacin; Hydralazine; Iron; Penicillins; Statins; and Sulfonamide derivatives   Social History:  The patient  reports that she has quit smoking. Her smoking use included Cigarettes. She smoked 0.00 packs per day. She does not have any smokeless tobacco  history on file. She reports that she does not drink alcohol.   Family History:  The patient's family history includes Heart failure in her mother; Lung cancer in her brother and sister; Stomach cancer in her brother.   ROS:  Please see the history of present illness.   She denies HAs, blurry vision, unilateral weakness, facial droop.   All other systems reviewed and negative.   PHYSICAL EXAM: VS:  BP 200/96  Pulse 70  Ht 5\' 8"  (1.727 m)  Wt 155 lb (70.308 kg)  BMI 23.57 kg/m2  Well nourished, well developed, in no acute distress HEENT: normal Neck: no JVD Cardiac:  normal S1, S2; RRR; no murmur Lungs:  clear to auscultation bilaterally, no wheezing, rhonchi or rales Abd: soft, nontender, no hepatomegaly Ext: trace bilateral LE  edema Skin: warm and dry Neuro:  CNs 2-12 intact, no focal abnormalities noted   EKG:  NSR, HR 70, LAD, NSSTTW changes, no change from prior tracing    ASSESSMENT AND PLAN:  1. Hypertension:  BP is way above target.  We gave her a Clonidine 0.1 in the office and monitored her for 25 minutes.  Her BP came down to 130/80.  I will increase her Clonidine to 0.1 bid.  She will return in close follow up. 2. Chronic Systolic CHF:  Volume stable.  Continue current Rx.  3. Chronic Kidney Disease: stable by recent measurements.  4. Ischemic Cardiomyopathy:  She is not on ACEI secondary to CKD. She has been intolerant to Coreg. Continue Toprol-XL 25 mg daily. 5. CAD:  No angina. She is on Plavix as she has an allergy to aspirin. She is intolerant to statins. 6. Disposition:  Follow up with me in 1-2 weeks.   Signed, Versie Starks, MHS 10/27/2013 4:56 PM    [facility] [address], [place], [place]  [address] Phone: [phone]; Fax: [phone]

## 2013-10-28 ENCOUNTER — Telehealth: Payer: Self-pay | Admitting: Physician Assistant

## 2013-11-03 ENCOUNTER — Encounter: Payer: Self-pay | Admitting: Physician Assistant

## 2013-11-03 ENCOUNTER — Ambulatory Visit (INDEPENDENT_AMBULATORY_CARE_PROVIDER_SITE_OTHER): Payer: Medicare HMO | Admitting: Physician Assistant

## 2013-11-03 VITALS — BP 182/86 | HR 56 | Ht 68.0 in | Wt 155.8 lb

## 2013-11-03 DIAGNOSIS — I255 Ischemic cardiomyopathy: Secondary | ICD-10-CM

## 2013-11-03 DIAGNOSIS — I5022 Chronic systolic (congestive) heart failure: Secondary | ICD-10-CM

## 2013-11-03 DIAGNOSIS — I2589 Other forms of chronic ischemic heart disease: Secondary | ICD-10-CM

## 2013-11-03 DIAGNOSIS — N183 Chronic kidney disease, stage 3 unspecified: Secondary | ICD-10-CM

## 2013-11-03 DIAGNOSIS — I10 Essential (primary) hypertension: Secondary | ICD-10-CM

## 2013-11-03 DIAGNOSIS — I2510 Atherosclerotic heart disease of native coronary artery without angina pectoris: Secondary | ICD-10-CM

## 2013-11-03 MED ORDER — PANTOPRAZOLE SODIUM 40 MG PO TBEC: DELAYED_RELEASE_TABLET | ORAL | Status: DC

## 2013-11-03 MED ORDER — METOPROLOL SUCCINATE ER 25 MG PO TB24: 12.5000 mg | ORAL_TABLET | Freq: Every day | ORAL | Status: DC

## 2013-11-03 NOTE — Progress Notes (Signed)
Cardiology Office Note    Date:  11/03/2013   ID:  [name], DOB 01/28/1934, MRN [id number]  PCP:  Delphina Cahill, MD  Cardiologist:  Dr. Sherren Mocha    History of Present Illness: [name] is a 80 y.o. female with a hx of CAD, status post BMS to the RCA in 2007, ischemic cardiomyopathy, HTN, CKD, stage IV non-small cell lung CA treated since 2007 with palliative chemotherapy.  EF has been 40-45% in the past. She has difficult to control hypertension. She has been intolerant to multiple medications including amlodipine, furosemide and hydralazine.  Echocardiogram in 07/2013 demonstrated worsening LV function with an EF of 35-40% and elevated filling pressures.  Patient was placed on Lasix.  I saw her 5/27 in f/u and her BP was markedly elevated.  We gave her some clonidine in the office.  It came down to optimal fairly quickly.  I adjusted her daily dose to bid and she returns for follow up.  She has changed her clonidine to 0.1 mg take 2 tablets QHS and she has stopped taking the Toprol.  She tells me her BP is better at home (130-150s).  She overall feels good.  The patient denies chest pain, shortness of breath, syncope, orthopnea, PND or significant pedal edema.    Studies:  - LHC (06/04/05):  LHC done in [place] with high grade RCA => s/p BMS to RCA  - Echo (07/26/13):  Mild LVH, EF 35-40%, diff HK, inf AK, Gr 2 DD, Tr AI, mildly dilated Ao root, MAC, mild MR, mild LAE, mod reduced RVSF.    - Nuclear (09/14/09):  Lexiscan;  Inf infarct with mild peri-infarct ishemia, EF 52%; Low Risk.   Recent Labs: 06/22/2013: ALT 8; Hemoglobin 13.2  09/03/2013: Creatinine 2.22*; Potassium 4.0   Wt Readings from Last 3 Encounters:  10/27/13 155 lb (70.308 kg)  08/24/13 152 lb (68.947 kg)  06/29/13 156 lb (70.761 kg)     Past Medical History  Diagnosis Date  . Non-small cell carcinoma of lung     Stage IV  . Hypertension   . Hypercholesterolemia   . CKD (chronic kidney  disease)   . Coronary artery disease     s/p PCI RCA  December 2006  . GERD (gastroesophageal reflux disease)   . Chronic anemia     Current Outpatient Prescriptions  Medication Sig Dispense Refill  . cloNIDine (CATAPRES) 0.1 MG tablet Take 1 tablet (0.1 mg total) by mouth 2 (two) times daily.  60 tablet  11  . clopidogrel (PLAVIX) 75 MG tablet TAKE ONE TABLET DAILY.  30 tablet  6  . furosemide (LASIX) 40 MG tablet Take 0.5 tablets (20 mg total) by mouth daily.      . metoprolol succinate (TOPROL-XL) 25 MG 24 hr tablet Take 1 tablet (25 mg total) by mouth every evening.      . pantoprazole (PROTONIX) 40 MG tablet TAKE ONE TABLET BY MOUTH EVERY DAY  90 tablet  1  . traMADol (ULTRAM) 50 MG tablet Take 50 mg by mouth every 6 (six) hours as needed. prn       No current facility-administered medications for this visit.    Allergies:   Amlodipine; Aspirin; Cephalexin; Ciprofloxacin; Hydralazine; Iron; Penicillins; Statins; and Sulfonamide derivatives   Social History:  The patient  reports that she has quit smoking. Her smoking use included Cigarettes. She smoked 0.00 packs per day. She does not have any smokeless tobacco history on file. She  reports that she does not drink alcohol.   Family History:  The patient's family history includes Heart failure in her mother; Lung cancer in her brother and sister; Stomach cancer in her brother.   ROS:  Please see the history of present illness.   No HAs.   All other systems reviewed and negative.   PHYSICAL EXAM: VS:  BP 182/86  Pulse 56  Ht 5\' 8"  (1.727 m)  Wt 155 lb 12.8 oz (70.67 kg)  BMI 23.69 kg/m2  Well nourished, well developed, in no acute distress HEENT: normal Neck: no JVD Cardiac:  normal S1, S2; RRR; no murmur Lungs:  clear to auscultation bilaterally, no wheezing, rhonchi or rales Abd: soft, nontender, no hepatomegaly Ext: trace bilateral LE edema Skin: warm and dry Neuro:  CNs 2-12 intact, no focal abnormalities noted      ASSESSMENT AND PLAN:  1. Hypertension:  Uncontrolled.  She has a good response to Clonidine.  I have asked her to change her Clonidine to 0.1 mg q 12 hours.  I have asked her to try to take Toprol XL 25 mg 1/2 tab QHS.  She will monitor her BP at home and call us with her BPs in 1-2 weeks.  2. Chronic Systolic CHF:  Volume stable.  Continue current Rx.  3. Chronic Kidney Disease 4. Ischemic Cardiomyopathy:  She is not on ACEI secondary to CKD. She has been intolerant to Coreg. Continue Toprol-XL 12.5 mg daily. 5. CAD:  No angina. She is on Plavix as she has an allergy to aspirin. She is intolerant to statins. 6. Disposition:  Follow up with Dr. Sherren Mocha in 3 mos.    Signed, Versie Starks, MHS 11/03/2013 2:18 PM    [facility] [address], [place], [place]  [address] Phone: [phone]; Fax: [phone]

## 2013-11-03 NOTE — Patient Instructions (Addendum)
CHANGE TAKING CLONIDINE TO 0.1 MG TWICE DAILY; MAKE SURE TO SPACE OUT EVERY 12 HOURS  DECREASE TOPROL TO 12.5 MG AT BEDTIME  CHECK BLOOD PRESSURE FOR 1-2 WEEKS AND CALL Carra Brindley OR SCOTT WEAVER, [facility] WITH READINGS [phone]  Your physician recommends that you schedule a follow-up appointment in: 3 MONTHS WITH DR. Burt Knack  A REFILL FOR PROTONIX WAS SENT IN

## 2013-11-05 ENCOUNTER — Telehealth: Payer: Self-pay | Admitting: *Deleted

## 2013-11-05 NOTE — Telephone Encounter (Signed)
Follow up  ° ° ° °Returning call back to nurse  °

## 2013-11-05 NOTE — Telephone Encounter (Signed)
lmptcb for bp readings

## 2013-11-05 NOTE — Telephone Encounter (Signed)
pt called with her BP readings 11/04/13 @ 7 am 151/67 HR 64, again @ 7:15 am 150/67 HR 63; 11/05/13 7 am 146/67 HR 54, again 7:15 am 147/70 HR 55. Will forward to [place] will call next wk with more readings. I said these looked ok.

## 2013-11-05 NOTE — Telephone Encounter (Signed)
New Message  Pt called to give Each reading on BP since she last OV with Richardson Dopp. (No further details). Please call

## 2013-11-05 NOTE — Telephone Encounter (Signed)
pt called with her BP readings 11/04/13 @ 7 am 151/67 HR 64, again @ 7:15 am 150/67 HR 63; 11/05/13 7 am 146/67 HR 54, again 7:15 am 147/70 HR 55. Will forward to [place] will call next wk with more readings. I said these looked ok.

## 2013-11-07 NOTE — Telephone Encounter (Signed)
Improved BPs Continue to monitor Richardson Dopp, PA-C   11/07/2013 9:31 PM

## 2013-11-16 NOTE — Telephone Encounter (Signed)
Follow up    Returning Carol's call to find out what Nicki Reaper said

## 2013-11-16 NOTE — Telephone Encounter (Signed)
lmom that I fowarded BP readings to [employer]. PA to look at and will cb with advice from PA once he looks at the BP readings.

## 2013-11-16 NOTE — Telephone Encounter (Signed)
Follow up     bp readings 6-10 148/72  HR 62 and 132/70 HR 59, 6-14 147/72 HR 63 and 143/63 HR 63, 6-16 132/60 HR 63 and 131/63 HR 64

## 2013-11-18 ENCOUNTER — Encounter: Payer: Self-pay | Admitting: *Deleted

## 2013-11-18 ENCOUNTER — Telehealth: Payer: Self-pay | Admitting: *Deleted

## 2013-11-18 NOTE — Telephone Encounter (Signed)
lvm with family member for ptcb that BP readings look good and to continue on current regimen.

## 2013-11-18 NOTE — Telephone Encounter (Signed)
pt notified per Brynda Rim. PA her BP readings look good and to continue curretn treatment plan.

## 2013-11-18 NOTE — Telephone Encounter (Signed)
lvm x 2 with family member for ptcb about bp readings look ok and no changes need to be made with medications

## 2013-12-27 ENCOUNTER — Other Ambulatory Visit (HOSPITAL_BASED_OUTPATIENT_CLINIC_OR_DEPARTMENT_OTHER): Payer: Medicare HMO

## 2013-12-27 ENCOUNTER — Ambulatory Visit (HOSPITAL_COMMUNITY): Payer: Medicare HMO

## 2013-12-27 DIAGNOSIS — Z85118 Personal history of other malignant neoplasm of bronchus and lung: Secondary | ICD-10-CM

## 2013-12-27 DIAGNOSIS — C3490 Malignant neoplasm of unspecified part of unspecified bronchus or lung: Secondary | ICD-10-CM

## 2013-12-27 DIAGNOSIS — I10 Essential (primary) hypertension: Secondary | ICD-10-CM

## 2013-12-27 LAB — COMPREHENSIVE METABOLIC PANEL (CC13)
ALT: 13 U/L (ref 0–55)
ANION GAP: 9 meq/L (ref 3–11)
AST: 18 U/L (ref 5–34)
Albumin: 3.3 g/dL — ABNORMAL LOW (ref 3.5–5.0)
Alkaline Phosphatase: 106 U/L (ref 40–150)
BUN: 42.6 mg/dL — ABNORMAL HIGH (ref 7.0–26.0)
CHLORIDE: 102 meq/L (ref 98–109)
CO2: 31 mEq/L — ABNORMAL HIGH (ref 22–29)
CREATININE: 2.6 mg/dL — AB (ref 0.6–1.1)
Calcium: 8.8 mg/dL (ref 8.4–10.4)
GLUCOSE: 119 mg/dL (ref 70–140)
Potassium: 3.8 mEq/L (ref 3.5–5.1)
SODIUM: 143 meq/L (ref 136–145)
Total Bilirubin: 0.37 mg/dL (ref 0.20–1.20)
Total Protein: 7 g/dL (ref 6.4–8.3)

## 2013-12-27 LAB — CBC WITH DIFFERENTIAL/PLATELET
BASO%: 1.1 % (ref 0.0–2.0)
BASOS ABS: 0.1 10*3/uL (ref 0.0–0.1)
EOS%: 7.8 % — ABNORMAL HIGH (ref 0.0–7.0)
Eosinophils Absolute: 0.5 10*3/uL (ref 0.0–0.5)
HEMATOCRIT: 39 % (ref 34.8–46.6)
HEMOGLOBIN: 12.7 g/dL (ref 11.6–15.9)
LYMPH%: 12.6 % — ABNORMAL LOW (ref 14.0–49.7)
MCH: 27.1 pg (ref 25.1–34.0)
MCHC: 32.5 g/dL (ref 31.5–36.0)
MCV: 83.5 fL (ref 79.5–101.0)
MONO#: 0.6 10*3/uL (ref 0.1–0.9)
MONO%: 9.6 % (ref 0.0–14.0)
NEUT#: 4.6 10*3/uL (ref 1.5–6.5)
NEUT%: 68.9 % (ref 38.4–76.8)
Platelets: 224 10*3/uL (ref 145–400)
RBC: 4.67 10*6/uL (ref 3.70–5.45)
RDW: 13.8 % (ref 11.2–14.5)
WBC: 6.7 10*3/uL (ref 3.9–10.3)
lymph#: 0.8 10*3/uL — ABNORMAL LOW (ref 0.9–3.3)

## 2013-12-28 ENCOUNTER — Telehealth: Payer: Self-pay | Admitting: Internal Medicine

## 2013-12-28 NOTE — Telephone Encounter (Signed)
APPT FOR 8/3 MOVED TO AM - CALL DAY - S/W PT SHE IS AWARE.

## 2013-12-29 ENCOUNTER — Other Ambulatory Visit: Payer: Self-pay | Admitting: Medical Oncology

## 2013-12-30 ENCOUNTER — Encounter (HOSPITAL_COMMUNITY): Payer: Self-pay

## 2013-12-30 ENCOUNTER — Ambulatory Visit (HOSPITAL_COMMUNITY)
Admission: RE | Admit: 2013-12-30 | Discharge: 2013-12-30 | Disposition: A | Discharge status: Home / Self Care | Payer: Medicare HMO | Source: Ambulatory Visit | Attending: Internal Medicine | Admitting: Internal Medicine

## 2013-12-30 DIAGNOSIS — J984 Other disorders of lung: Secondary | ICD-10-CM | POA: Diagnosis not present

## 2013-12-30 DIAGNOSIS — Z9221 Personal history of antineoplastic chemotherapy: Secondary | ICD-10-CM | POA: Insufficient documentation

## 2013-12-30 DIAGNOSIS — N281 Cyst of kidney, acquired: Secondary | ICD-10-CM | POA: Diagnosis not present

## 2013-12-30 DIAGNOSIS — J438 Other emphysema: Secondary | ICD-10-CM | POA: Diagnosis not present

## 2013-12-30 DIAGNOSIS — C3490 Malignant neoplasm of unspecified part of unspecified bronchus or lung: Secondary | ICD-10-CM | POA: Diagnosis not present

## 2013-12-30 DIAGNOSIS — I2510 Atherosclerotic heart disease of native coronary artery without angina pectoris: Secondary | ICD-10-CM | POA: Insufficient documentation

## 2013-12-30 DIAGNOSIS — I700 Atherosclerosis of aorta: Secondary | ICD-10-CM | POA: Insufficient documentation

## 2013-12-30 DIAGNOSIS — I77810 Thoracic aortic ectasia: Secondary | ICD-10-CM | POA: Insufficient documentation

## 2013-12-30 DIAGNOSIS — M47814 Spondylosis without myelopathy or radiculopathy, thoracic region: Secondary | ICD-10-CM | POA: Insufficient documentation

## 2013-12-31 ENCOUNTER — Telehealth: Payer: Self-pay | Admitting: *Deleted

## 2013-12-31 DIAGNOSIS — I10 Essential (primary) hypertension: Secondary | ICD-10-CM

## 2013-12-31 DIAGNOSIS — N183 Chronic kidney disease, stage 3 unspecified: Secondary | ICD-10-CM

## 2013-12-31 MED ORDER — FUROSEMIDE 40 MG PO TABS: 20.0000 mg | ORAL_TABLET | Freq: Every day | ORAL | Status: DC

## 2013-12-31 NOTE — Telephone Encounter (Signed)
lmptcb that [name] did not have on lasix 40 bid, she is on lasix 40 mg tab with directions to take 1/2 tab daily. I did send in an rx for the lasix today. Said any question call [phone].

## 2013-12-31 NOTE — Telephone Encounter (Signed)
Patient called for a refill of lasix. She stated that Richardson Dopp told her to take 40mg  bid, but the last office note says 20mg  qd. She got very upset and proceeded to hang up on me, since I was questioning the dose. She said that was fine not to worry about it, she would just continue to swell. She did not give me the chance to tell her that I would get this clarified. She would like this sent to [facility] Please advise. Thanks, MI

## 2014-01-03 ENCOUNTER — Ambulatory Visit (HOSPITAL_BASED_OUTPATIENT_CLINIC_OR_DEPARTMENT_OTHER): Payer: Medicare HMO | Admitting: Internal Medicine

## 2014-01-03 ENCOUNTER — Telehealth: Payer: Self-pay | Admitting: Internal Medicine

## 2014-01-03 ENCOUNTER — Encounter: Payer: Self-pay | Admitting: Internal Medicine

## 2014-01-03 VITALS — BP 177/84 | HR 54 | Temp 97.8°F | Resp 18 | Ht 68.0 in | Wt 153.0 lb

## 2014-01-03 DIAGNOSIS — Z85118 Personal history of other malignant neoplasm of bronchus and lung: Secondary | ICD-10-CM

## 2014-01-03 DIAGNOSIS — R911 Solitary pulmonary nodule: Secondary | ICD-10-CM

## 2014-01-03 DIAGNOSIS — C3491 Malignant neoplasm of unspecified part of right bronchus or lung: Secondary | ICD-10-CM

## 2014-01-03 NOTE — Progress Notes (Signed)
[place] [phone]   Fax:([phone]) [place], [place]  [place] [address]  PRINCIPAL DIAGNOSIS: Stage IV non-small cell lung cancer diagnosed in March 2007.   PRIOR THERAPY: Status post 6 cycles of systemic chemotherapy with carboplatin and docetaxel. Last dose was given January 16, 2006.   CURRENT THERAPY: Observation.  DISEASE STAGE: Stage IV non-small cell lung cancer diagnosed in March of 2007.  CHEMOTHERAPY INTENT: Palliative  CURRENT # OF CHEMOTHERAPY CYCLES: 0  CURRENT ANTIEMETICS: None  CURRENT SMOKING STATUS: Nonsmoker  ORAL CHEMOTHERAPY AND CONSENT: None  CURRENT BISPHOSPHONATES USE: None  LIVING WILL AND CODE STATUS: Full code  INTERVAL HISTORY: [name] 80 y.o. female returns to the clinic today for six-month followup visit accompanied by her daughter. The patient is feeling fine today with no specific complaints. She has been on observation for the last 8 years and doing very well. She denied having any significant chest pain, but has shortness of breath with exertion, no cough or hemoptysis. The patient denied having any weight loss or night sweats. She had repeat CT scan of the chest performed recently and she is here for evaluation and discussion of her scan results.  MEDICAL HISTORY: Past Medical History  Diagnosis Date  . Hypertension   . Hypercholesterolemia   . CKD (chronic kidney disease)   . Coronary artery disease     s/p PCI RCA  December 2006  . GERD (gastroesophageal reflux disease)   . Chronic anemia   . Non-small cell carcinoma of lung     Stage IV    ALLERGIES:  is allergic to amlodipine; aspirin; cephalexin; ciprofloxacin; hydralazine; iron; penicillins; statins; and sulfonamide derivatives.  MEDICATIONS:  Current Outpatient Prescriptions  Medication Sig Dispense Refill  . cloNIDine (CATAPRES) 0.1 MG tablet Take 1 tablet (0.1 mg total) by mouth 2  (two) times daily.  60 tablet  11  . clopidogrel (PLAVIX) 75 MG tablet TAKE ONE TABLET DAILY.  30 tablet  6  . furosemide (LASIX) 40 MG tablet Take 0.5 tablets (20 mg total) by mouth daily.  30 tablet  11  . metoprolol succinate (TOPROL-XL) 25 MG 24 hr tablet Take 0.5 tablets (12.5 mg total) by mouth at bedtime.      . pantoprazole (PROTONIX) 40 MG tablet TAKE ONE TABLET BY MOUTH EVERY DAY  90 tablet  3   No current facility-administered medications for this visit.    SURGICAL HISTORY:  Past Surgical History  Procedure Laterality Date  . Hematoma evacuation  December 2006    groin  . Appendectomy    . Cataract extraction    . Hemorroidectomy    . Laminectomy      REVIEW OF SYSTEMS:  A comprehensive review of systems was negative.   PHYSICAL EXAMINATION: General appearance: alert, cooperative and no distress Head: Normocephalic, without obvious abnormality, atraumatic Neck: no adenopathy Lymph nodes: Cervical, supraclavicular, and axillary nodes normal. Resp: clear to auscultation bilaterally Cardio: regular rate and rhythm, S1, S2 normal, no murmur, click, rub or gallop GI: soft, non-tender; bowel sounds normal; no masses,  no organomegaly Extremities: extremities normal, atraumatic, no cyanosis or edema  ECOG PERFORMANCE STATUS: 1 - Symptomatic but completely ambulatory  Blood pressure 177/84, pulse 54, temperature 97.8 F (36.6 C), temperature source Oral, resp. rate 18, height 5\' 8"  (1.727 m), weight 153 lb (69.4 kg), SpO2 96.00%.  LABORATORY DATA: Lab Results  Component Value  Date   WBC 6.7 12/27/2013   HGB 12.7 12/27/2013   HCT 39.0 12/27/2013   MCV 83.5 12/27/2013   PLT 224 12/27/2013      Chemistry      Component Value Date/Time   NA 143 12/27/2013 1335   NA 140 09/03/2013 0904   NA 143 12/23/2011 0830   K 3.8 12/27/2013 1335   K 4.0 09/03/2013 0904   K 4.7 12/23/2011 0830   CL 101 09/03/2013 0904   CL 104 06/19/2012 0958   CL 101 12/23/2011 0830   CO2 31* 12/27/2013  1335   CO2 27 09/03/2013 0904   CO2 27 12/23/2011 0830   BUN 42.6* 12/27/2013 1335   BUN 32* 09/03/2013 0904   BUN 25* 12/23/2011 0830   CREATININE 2.6* 12/27/2013 1335   CREATININE 2.22* 09/03/2013 0904   CREATININE 2.4* 08/27/2013 0819      Component Value Date/Time   CALCIUM 8.8 12/27/2013 1335   CALCIUM 9.3 09/03/2013 0904   CALCIUM 9.0 12/23/2011 0830   ALKPHOS 106 12/27/2013 1335   ALKPHOS 76 12/23/2011 0830   ALKPHOS 91 04/08/2011 1023   AST 18 12/27/2013 1335   AST 16 12/23/2011 0830   AST 17 04/08/2011 1023   ALT 13 12/27/2013 1335   ALT 16 12/23/2011 0830   ALT 10 04/08/2011 1023   BILITOT 0.37 12/27/2013 1335   BILITOT 0.60 12/23/2011 0830   BILITOT 0.4 04/08/2011 1023       RADIOGRAPHIC STUDIES:  Ct Chest Wo Contrast  12/30/2013   CLINICAL DATA:  Lung cancer diagnosed 2007, chemotherapy complete.  EXAM: CT CHEST WITHOUT CONTRAST  TECHNIQUE: Multidetector CT imaging of the chest was performed following the standard protocol without IV contrast.  COMPARISON:  06/23/2013  FINDINGS: 12 x 9 mm spiculated left upper lobe nodule (series 4, image 21), previously 11 x 10 mm, grossly unchanged.  No new/suspicious pulmonary nodules. Mild scarring in the lingula and right lower lobe. Mild nodular scarring at the left lung base. Underlying emphysematous changes. No pleural effusion or pneumothorax.  Visualized thyroid is mildly heterogeneous/nodular.  The heart is normal in size. No pericardial effusion. Coronary atherosclerosis. Atherosclerotic calcifications of the aortic arch. Bovine arch. Ectasia of the ascending thoracic aorta measuring 3.8 cm.  Small mediastinal lymph nodes which do not meet pathologic CT size criteria.  Visualized upper abdomen is notable for cholecystectomy clips, vascular calcifications, and a 1.8 cm lateral right upper pole renal cyst.  Degenerative changes of the visualized thoracolumbar spine.  IMPRESSION: 12 x 9 mm spiculated left upper lobe nodule, grossly unchanged.    Electronically Signed   By: Julian Hy M.D.   On: 12/30/2013 15:41   ASSESSMENT AND PLAN: This is a very pleasant 80 years old white female with history of stage IV non-small cell lung cancer diagnosed in March of 2007 status post 6 cycles of systemic chemotherapy with carboplatin and docetaxel and has been observation since that time with no evidence for disease progression.  She continues to have a 1.9 CM spiculated left upper lobe nodule that require close observation. I discussed the scan results with the patient and her daughter.  I recommended for her to continue on observation with repeat CT scan of the chest without contrast in 6 months. The patient was advised to call immediately if she has any concerning symptoms in the interval.  All questions were answered. The patient knows to call the clinic with any problems, questions or concerns. We can certainly see the  patient much sooner if necessary.  Disclaimer: This note was dictated with voice recognition software. Similar sounding words can inadvertently be transcribed and may not be corrected upon review.

## 2014-01-03 NOTE — Telephone Encounter (Signed)
gv and printed appt sched and avs for pt for Jan 2015 °

## 2014-01-24 ENCOUNTER — Ambulatory Visit (INDEPENDENT_AMBULATORY_CARE_PROVIDER_SITE_OTHER): Payer: Medicare HMO

## 2014-01-24 ENCOUNTER — Encounter: Payer: Self-pay | Admitting: Podiatry

## 2014-01-24 ENCOUNTER — Ambulatory Visit (INDEPENDENT_AMBULATORY_CARE_PROVIDER_SITE_OTHER): Payer: Medicare HMO | Admitting: Podiatry

## 2014-01-24 VITALS — BP 161/87 | HR 70 | Temp 98.1°F | Resp 18

## 2014-01-24 DIAGNOSIS — L02419 Cutaneous abscess of limb, unspecified: Secondary | ICD-10-CM

## 2014-01-24 DIAGNOSIS — L03119 Cellulitis of unspecified part of limb: Secondary | ICD-10-CM

## 2014-01-24 DIAGNOSIS — M10072 Idiopathic gout, left ankle and foot: Secondary | ICD-10-CM

## 2014-01-24 DIAGNOSIS — M109 Gout, unspecified: Secondary | ICD-10-CM

## 2014-01-24 DIAGNOSIS — R52 Pain, unspecified: Secondary | ICD-10-CM

## 2014-01-24 DIAGNOSIS — L03116 Cellulitis of left lower limb: Secondary | ICD-10-CM

## 2014-01-24 LAB — CBC WITH DIFFERENTIAL/PLATELET
Basophils Absolute: 0.1 10*3/uL (ref 0.0–0.1)
Basophils Relative: 1 % (ref 0–1)
EOS ABS: 0.8 10*3/uL — AB (ref 0.0–0.7)
Eosinophils Relative: 11 % — ABNORMAL HIGH (ref 0–5)
HCT: 38.8 % (ref 36.0–46.0)
Hemoglobin: 13.3 g/dL (ref 12.0–15.0)
Lymphocytes Relative: 17 % (ref 12–46)
Lymphs Abs: 1.2 10*3/uL (ref 0.7–4.0)
MCH: 27.4 pg (ref 26.0–34.0)
MCHC: 34.3 g/dL (ref 30.0–36.0)
MCV: 80 fL (ref 78.0–100.0)
MONOS PCT: 8 % (ref 3–12)
Monocytes Absolute: 0.6 10*3/uL (ref 0.1–1.0)
NEUTROS ABS: 4.3 10*3/uL (ref 1.7–7.7)
Neutrophils Relative %: 63 % (ref 43–77)
Platelets: 277 10*3/uL (ref 150–400)
RBC: 4.85 MIL/uL (ref 3.87–5.11)
RDW: 13.9 % (ref 11.5–15.5)
WBC: 6.9 10*3/uL (ref 4.0–10.5)

## 2014-01-24 NOTE — Patient Instructions (Signed)
Monitor for any signs/symptoms of infection. Call the office immediately if any occur or go directly to the emergency room. Call with any questions/concerns.  

## 2014-01-24 NOTE — Progress Notes (Signed)
   Subjective:    Patient ID: [name], female    DOB: 11-12-1933, 80 y.o.   MRN: [id number]  HPI  [name], 80 year old female, presents the office today with complaints of left foot swelling and pain which has been present for approximately 3 weeks. She states that it is tender and hurts more with shoe gear. She has noted redness over her foot and on the side. She states that she believes the redness the swelling has decreased over the weekend. Her pain has also decreased as she has been taking Advil (although she states she is aware she is not suppose to). She does state that she previously had been treated for gout. Also states she has 2 small scab areas that itch on her left foot. Denies any systemic complaints such as fevers, chills, nausea, vomiting. No changes and diet recently. No other complaints at this time.     Review of Systems  Eyes: Positive for itching.  Allergic/Immunologic: Positive for food allergies.       Shrimp  Hematological: Bruises/bleeds easily.  All other systems reviewed and are negative.      Objective:   Physical Exam AAO x3, NAD DP/PT pulses palpable b/l.  CRT < 3 sec Mild edema to the left foot, mostly concentrated over the first and fifth MPJ.. There is mild erythema overlying the metatarsophalangeal joints 1 through 5 on the left foot. There is also mild erythema over the medial aspect of the foot. No streaking or ascending cellulitis.  There are 2 small scabs present on the dorsal medial aspect of the foot and medial ankle with a thin rim of surrounding erythema but does not expend and there is no associated drainage. No areas of fluctuance or crepitus. Minimal discomfort with ROM of the MPJ. No open lesions.  No interdigital maceration.  Decreased protective sensation with a Semmes Weinstein monofilament.       Assessment & Plan:  80 year old female who with likely gout versus localized cellulitis -X-rays were obtained. The x-ray report for  full details. No portal destruction to suggest osteomyelitis, soft tissue emphysema -Conservative versus surgical intervention was discussed the patient in detail including alternatives, risks, complications. -Currently the patient is afebrile without any systemic complaints.  -At this time we'll obtain blood work to rule out gout versus cellulitis. We'll obtain CBC, ESR, CRP, CBC.  -With the results of the blood work is done we will consider antibiotic versus gout therapy. Stressed the importance of the patient in the blood work done this afternoon and noted to start therapy. -Dispensed surgical shoe -Steroid cream for itch -Continue to monitor and if there are any changes or worsening of symptoms the patient is to call the office immediately or go directly to the emergency room.  -Follow up in 1 week, or sooner if any changes/worsening.   Disclaimer: This note was dictated with voice recognition software. Similar sounding words can inadvertently be transcribed and may not be corrected upon review.

## 2014-01-25 ENCOUNTER — Telehealth: Payer: Self-pay | Admitting: Podiatry

## 2014-01-25 ENCOUNTER — Other Ambulatory Visit: Payer: Self-pay | Admitting: Podiatry

## 2014-01-25 LAB — ANTI-NUCLEAR AB-TITER (ANA TITER)

## 2014-01-25 LAB — URIC ACID: Uric Acid, Serum: 11.8 mg/dL — ABNORMAL HIGH (ref 2.4–7.0)

## 2014-01-25 LAB — SEDIMENTATION RATE: Sed Rate: 36 mm/hr — ABNORMAL HIGH (ref 0–22)

## 2014-01-25 LAB — ANA: ANA: POSITIVE — AB

## 2014-01-25 LAB — RHEUMATOID FACTOR

## 2014-01-25 LAB — C-REACTIVE PROTEIN: CRP: 1.7 mg/dL — AB (ref <0.60)

## 2014-01-25 MED ORDER — METHYLPREDNISOLONE (PAK) 4 MG PO TABS: ORAL_TABLET | ORAL | Status: DC

## 2014-01-25 NOTE — Telephone Encounter (Signed)
Returned patients call in regards to discussing blood work with her. I told her I spoke to her primary care physician in regards to treatment.  Although I have prescribed a medrol dose pack after Dr. Juel Burrow recommendation, I told her to hold off on starting it until I follow up with her cardiologist. She understands.  She does state that the pain has decreased since yesterday, although it continues to be swollen.  I will will call her as soon as I hear from them.

## 2014-01-25 NOTE — Telephone Encounter (Signed)
Called patient to follow up with her and discuss results of her blood work from yesterday.   WBC 6.9 ESR 36 CRP 1.7 Uric Acid 11.8 RF <10 ANA Positive   I spoke to Dr. Zack Hall, MD (her PCP) about the results and gout. He recommended a medrol dose pack. I have ordered this and sent it to the pharmacy.   I left a message for the patient to call me back and we can discuss this further.   I will make her an appointment to follow up in 1 week, or she is to call sooner if any problems arise.  

## 2014-01-25 NOTE — Telephone Encounter (Signed)
After discussing with patients cardiologist, can proceed with medrol dosepak. Sent new prescription over to her pharmacy.  Left the patient a message F/U in one week, or sooner if there are any changes/problems.

## 2014-01-27 ENCOUNTER — Ambulatory Visit: Payer: Self-pay | Admitting: Podiatry

## 2014-01-28 ENCOUNTER — Telehealth: Payer: Self-pay | Admitting: *Deleted

## 2014-01-28 DIAGNOSIS — N183 Chronic kidney disease, stage 3 unspecified: Secondary | ICD-10-CM

## 2014-01-28 DIAGNOSIS — I5022 Chronic systolic (congestive) heart failure: Secondary | ICD-10-CM

## 2014-01-28 DIAGNOSIS — I10 Essential (primary) hypertension: Secondary | ICD-10-CM

## 2014-01-28 NOTE — Telephone Encounter (Signed)
I spoke with the pt and she has been taking Furosemide 40mg  daily since she saw Richardson Dopp PA-C.  The pt's SOB is about her baseline.  Her weight is 153 lbs and this is not fluctuating.  Due to the pt's kidney disease I told her that I cannot advise her on correct Furosemide dose at this time.  The pt's chart states that she should be taking Furosemide 20mg  daily.  I advised the pt that she needs to have a BMP checked and she will go to [facility] in [place] to have this checked.  The pt currently has a 5 day supply of Furosemide.

## 2014-01-28 NOTE — Telephone Encounter (Signed)
Patient called and stated that she needs an rx for lasix 40mg  qd. Her last office note has 40mg , take 0.5 tablet qd. She stated that she took one at 5:00 am and she has not urinated yet. She also stated that her fingers are so swollen that she cannot remove her rings and she feels like she is having s.o.b. Please advise. Thanks, MI

## 2014-01-31 ENCOUNTER — Telehealth: Payer: Self-pay | Admitting: Cardiovascular Disease

## 2014-01-31 DIAGNOSIS — I10 Essential (primary) hypertension: Secondary | ICD-10-CM

## 2014-01-31 MED ORDER — METOPROLOL SUCCINATE ER 25 MG PO TB24: 12.5000 mg | ORAL_TABLET | Freq: Every day | ORAL | Status: DC

## 2014-01-31 NOTE — Telephone Encounter (Signed)
Spoke with Psychologist, forensic at [facility] and they have order for BMP.  I spoke with pt and gave her this information. She is asking why she needs this lab. I explained this is to check her kidney function. She also needs refill of Toprol sent to [facility]. Medication list indicates she is taking Toprol 12.5 mg by mouth daily but pt states she has been taking Toprol 25 mg by mouth daily since office visit with Richardson Dopp, PA on November 03, 2013. She reports blood pressure at home has been 106-114/60-70. Will send refill for Toprol 12.5 mg by mouth daily to [facility]. Pt aware to follow label directions to take half tablet daily.

## 2014-01-31 NOTE — Telephone Encounter (Signed)
Noted. thx 

## 2014-01-31 NOTE — Telephone Encounter (Signed)
New message     Pt want to know if Lauren faxed an order to [facility] in [place] to have labs drawn.  It is the one across from [employer] [employer].  She does not have the phone number.  Please call

## 2014-02-01 ENCOUNTER — Encounter: Payer: Self-pay | Admitting: Cardiovascular Disease

## 2014-02-01 NOTE — Telephone Encounter (Signed)
New message     Want Lauren to fax an order to have lab drawn at [facility] in [place].  Their number  Is [phone]

## 2014-02-01 NOTE — Telephone Encounter (Signed)
This encounter was created in error - please disregard.

## 2014-02-02 LAB — BASIC METABOLIC PANEL
BUN: 35 mg/dL — AB (ref 6–23)
CALCIUM: 8.9 mg/dL (ref 8.4–10.5)
CO2: 27 mEq/L (ref 19–32)
Chloride: 100 mEq/L (ref 96–112)
Creat: 2.09 mg/dL — ABNORMAL HIGH (ref 0.50–1.10)
GLUCOSE: 172 mg/dL — AB (ref 70–99)
Potassium: 4 mEq/L (ref 3.5–5.3)
Sodium: 138 mEq/L (ref 135–145)

## 2014-02-03 MED ORDER — FUROSEMIDE 40 MG PO TABS
40.0000 mg | ORAL_TABLET | Freq: Every day | ORAL | Status: DC
Start: 2014-02-03 — End: 2015-02-17

## 2014-02-03 NOTE — Telephone Encounter (Signed)
Pt's kidney function is improved on recent BMP.  Per Dr Burt Knack the pt can continue Furosemide 40mg  daily.  New Rx sent to the pharmacy with updated instructions. Pt aware.

## 2014-02-11 ENCOUNTER — Encounter: Payer: Self-pay | Admitting: Cardiovascular Disease

## 2014-02-11 ENCOUNTER — Ambulatory Visit (INDEPENDENT_AMBULATORY_CARE_PROVIDER_SITE_OTHER): Payer: Medicare HMO | Admitting: Cardiovascular Disease

## 2014-02-11 VITALS — BP 160/80 | HR 96 | Ht 68.0 in | Wt 154.1 lb

## 2014-02-11 DIAGNOSIS — I10 Essential (primary) hypertension: Secondary | ICD-10-CM

## 2014-02-11 DIAGNOSIS — I2510 Atherosclerotic heart disease of native coronary artery without angina pectoris: Secondary | ICD-10-CM

## 2014-02-11 NOTE — Progress Notes (Signed)
HPI:  80 year old woman presenting for cardiology followup evaluation. The patient has coronary artery disease and she has undergone coronary stenting in 2007 when she was treated with a bare-metal stent in the right coronary artery. She also has difficult to control hypertension and chronic kidney disease. The patient has stage IV non-small cell lung cancer and has been treated since 2007 with palliative chemotherapy. Remarkably her cancer has been stable since that time. She's been treated with Plavix for her coronary disease as she is aspirin allergic. The patient's left ventricular ejection fraction is been in the range of 40-45%.  Renal function has been stable with most recent creatinine 2.1. She was last seen by Richardson Dopp in June 2015. He has been titrating her antihypertensive medications over the last several months.  The patient complains of poor sleep. She attributes this to her bedtime dose of long-acting metoprolol succinate. She had fatigue with this when she took it during the day. She wakes 3 or 4 hours after taking Toprol. Her other symptoms are unchanged. Dyspnea with exertion and fatigue are her primary complaints. She's had no recent chest pain or leg swelling. Treatment of her hypertension has always been complicated by multiple medication intolerances.  Outpatient Encounter Prescriptions as of 02/11/2014  Medication Sig  . cloNIDine (CATAPRES) 0.1 MG tablet Take 1 tablet (0.1 mg total) by mouth 2 (two) times daily.  . clopidogrel (PLAVIX) 75 MG tablet TAKE ONE TABLET DAILY.  . furosemide (LASIX) 40 MG tablet Take 1 tablet (40 mg total) by mouth daily.  . methylPREDNIsolone (MEDROL DOSPACK) 4 MG tablet follow package directions  . metoprolol succinate (TOPROL-XL) 25 MG 24 hr tablet Take 0.5 tablets (12.5 mg total) by mouth at bedtime.  . pantoprazole (PROTONIX) 40 MG tablet TAKE ONE TABLET BY MOUTH EVERY DAY  . [DISCONTINUED] tobramycin-dexamethasone (TOBRADEX) ophthalmic  solution     Allergies  Allergen Reactions  . Amlodipine Swelling  . Aspirin   . Cephalexin   . Ciprofloxacin     REACTION: weakness  . Hydralazine   . Iron   . Penicillins   . Statins   . Sulfonamide Derivatives     Past Medical History  Diagnosis Date  . Hypertension   . Hypercholesterolemia   . CKD (chronic kidney disease)   . Coronary artery disease     s/p PCI RCA  December 2006  . GERD (gastroesophageal reflux disease)   . Chronic anemia   . Non-small cell carcinoma of lung     Stage IV    ROS: Negative except as per HPI  BP 160/80  Pulse 96  Ht 5\' 8"  (1.727 m)  Wt 154 lb 1.9 oz (69.908 kg)  BMI 23.44 kg/m2  PHYSICAL EXAM: Pt is alert and oriented, NAD HEENT: normal Neck: JVP - normal, carotids 2+=  Lungs: CTA bilaterally CV: RRR without murmur or gallop Abd: soft, NT, Positive BS, no hepatomegaly Ext: no C/C/E, distal pulses intact and equal Skin: warm/dry no rash  2-D echocardiogram July 26, 2013: Study Conclusions  - Left ventricle: The cavity size was normal. Wall thickness was increased in a pattern of mild LVH. Systolic function was moderately reduced. The estimated ejection fraction was in the range of 35% to 40%. Diffuse hypokinesis. There is akinesis of the basal-midinferior myocardium. Features are consistent with a pseudonormal left ventricular filling pattern, with concomitant abnormal relaxation and increased filling pressure (grade 2 diastolic dysfunction). Doppler parameters are consistent with high ventricular filling pressure. - Aortic valve: Valve  mobility was mildly restricted. Trivial regurgitation. - Aortic root: The aortic root was mildly dilated. - Mitral valve: Calcified annulus. Mild regurgitation. - Left atrium: The atrium was mildly dilated. - Right ventricle: Systolic function was moderately reduced.  ASSESSMENT AND PLAN: 1. Malignant hypertension - recommend continue clonidine at current dose. Hold metoprolol  succinate. Continue to monitor blood pressure at home. Home readings are lower than those obtained here in the office. Call if home blood pressure greater than 150/90. Followup with Richardson Dopp in 3 months. I will see her back in 6 months.  2. Chronic systolic heart failure, New York Heart Association class II. Overall stable. No ACE inhibitor because of advanced chronic kidney disease. Appears to be intolerant to even low dose of a beta blocker.  3. Chronic kidney disease, stage IV. Overall stable. Recent labs reviewed.  4. Coronary artery disease, native vessel, without symptoms of angina. Continue Plavix in the setting of aspirin allergy.  Sherren Mocha MD 02/11/2014 2:33 PM

## 2014-02-11 NOTE — Patient Instructions (Signed)
Your physician has recommended you make the following change in your medication: Ok to [employer]  Your physician recommends that you schedule a follow-up appointment in: 3 months with [place] wants you to follow-up in: 6 months with Dr Emelda Fear will receive a reminder letter in the mail two months in advance. If you don't receive a letter, please call our office to schedule the follow-up appointment.

## 2014-03-04 ENCOUNTER — Other Ambulatory Visit: Payer: Self-pay | Admitting: Cardiovascular Disease

## 2014-04-06 ENCOUNTER — Other Ambulatory Visit: Payer: Self-pay | Admitting: *Deleted

## 2014-04-06 MED ORDER — CLOPIDOGREL BISULFATE 75 MG PO TABS: ORAL_TABLET | ORAL | Status: DC

## 2014-05-16 ENCOUNTER — Encounter: Payer: Self-pay | Admitting: Physician Assistant

## 2014-05-16 ENCOUNTER — Ambulatory Visit (INDEPENDENT_AMBULATORY_CARE_PROVIDER_SITE_OTHER): Payer: Medicare HMO | Admitting: Physician Assistant

## 2014-05-16 VITALS — BP 160/90 | HR 72 | Ht 68.0 in | Wt 155.0 lb

## 2014-05-16 DIAGNOSIS — I255 Ischemic cardiomyopathy: Secondary | ICD-10-CM

## 2014-05-16 DIAGNOSIS — I2510 Atherosclerotic heart disease of native coronary artery without angina pectoris: Secondary | ICD-10-CM

## 2014-05-16 DIAGNOSIS — I10 Essential (primary) hypertension: Secondary | ICD-10-CM

## 2014-05-16 DIAGNOSIS — N184 Chronic kidney disease, stage 4 (severe): Secondary | ICD-10-CM

## 2014-05-16 DIAGNOSIS — I5022 Chronic systolic (congestive) heart failure: Secondary | ICD-10-CM

## 2014-05-16 LAB — BASIC METABOLIC PANEL
BUN: 40 mg/dL — ABNORMAL HIGH (ref 6–23)
CALCIUM: 9.3 mg/dL (ref 8.4–10.5)
CO2: 30 mEq/L (ref 19–32)
Chloride: 99 mEq/L (ref 96–112)
Creatinine, Ser: 2.4 mg/dL — ABNORMAL HIGH (ref 0.4–1.2)
GFR: 21.12 mL/min — AB (ref >60.00)
GLUCOSE: 106 mg/dL — AB (ref 70–99)
POTASSIUM: 3.2 meq/L — AB (ref 3.5–5.1)
Sodium: 138 mEq/L (ref 135–145)

## 2014-05-16 MED ORDER — CLONIDINE HCL 0.2 MG PO TABS: 0.2000 mg | ORAL_TABLET | Freq: Two times a day (BID) | ORAL | Status: DC

## 2014-05-16 NOTE — Patient Instructions (Addendum)
Your physician has recommended you make the following change in your medication:  1. INCREASE CLONIDINE TO 0.2 MG 1 TABLET TWICE DAILY; NEW RX SENT IN TODAY 2. OK TO TAKE AN EXTRA 1/2 TAB OF LASIX 20 MG AS NEEDED FOR INCREASED SWELLING OR IF YOU SEE MORE SWELLING WEIGHT GAIN OF 3 LB'S OR MORE OVER NIGHT  LAB WORK TODAY; BMET  FOLLOW UP WITH DR. Burt Knack 08/2014  HERE IS THE PHONE # FOR FREE BALANCE SCREENING [phone]

## 2014-05-16 NOTE — Progress Notes (Signed)
Cardiology Office Note   Date:  05/16/2014   ID:  [name], DOB 16-Jul-1933, MRN [id number]  PCP:  Delphina Cahill, MD  Cardiologist:  Dr. Sherren Mocha     History of Present Illness: [name] is a 80 y.o. female with a hx of CAD, status post BMS to the RCA in 2007, ischemic cardiomyopathy, HTN, CKD, stage IV non-small cell lung CA treated since 2007 with palliative chemotherapy. EF has been 40-45% in the past. She has difficult to control hypertension. She has been intolerant to multiple medications including amlodipine, furosemide and hydralazine. Echocardiogram in 07/2013 demonstrated worsening LV function with an EF of 35-40% and elevated filling pressures. Patient was placed on Lasix. I saw her in 10/2013 in f/u and her BP was markedly elevated. We gave her some clonidine in the office and I adjusted her daily dose to bid.  She was last seen by Dr. Sherren Mocha in 02/2014.  Patient was intolerant of very low dose beta blocker (Toprol-XL 12.5 mg).  This was stopped.  She now returns for FU.    She is still taking Toprol-XL 12.5 mg QD. She recently noted some increased LE edema.  This is now improved.  She denies worsening dyspnea.  She is NYHA 2b.  She denies chest pain, orthopnea, PND.  She denies syncope.    Recent Labs: 12/27/2013: ALT 13 01/24/2014: Hemoglobin 13.3 02/01/2014: BUN 35*; Creatinine 2.09*; Potassium 4.0; Sodium 138    Recent Radiology: No results found.    Wt Readings from Last 3 Encounters:  05/16/14 155 lb (70.308 kg)  02/11/14 154 lb 1.9 oz (69.908 kg)  01/03/14 153 lb (69.4 kg)     Past Medical History  Diagnosis Date  . Hypertension   . Hypercholesterolemia   . CKD (chronic kidney disease)   . Coronary artery disease     s/p PCI RCA  December 2006  . GERD (gastroesophageal reflux disease)   . Chronic anemia   . Non-small cell carcinoma of lung     Stage IV    Current Outpatient Prescriptions  Medication Sig Dispense Refill  .  cloNIDine (CATAPRES) 0.1 MG tablet Take 1 tablet (0.1 mg total) by mouth 2 (two) times daily. 60 tablet 11  . clopidogrel (PLAVIX) 75 MG tablet TAKE ONE TABLET DAILY. 90 tablet 3  . furosemide (LASIX) 40 MG tablet Take 1 tablet (40 mg total) by mouth daily. 90 tablet 3  . methylPREDNIsolone (MEDROL DOSPACK) 4 MG tablet follow package directions 21 tablet 0  . metoprolol succinate (TOPROL-XL) 25 MG 24 hr tablet Take 0.5 tablets (12.5 mg total) by mouth at bedtime. 15 tablet 6  . pantoprazole (PROTONIX) 40 MG tablet TAKE ONE TABLET BY MOUTH EVERY DAY 90 tablet 3   No current facility-administered medications for this visit.     Allergies:   Amlodipine; Aspirin; Cephalexin; Ciprofloxacin; Hydralazine; Iron; Penicillins; Statins; and Sulfonamide derivatives   Social History:  The patient  reports that she has quit smoking. Her smoking use included Cigarettes. She smoked 0.00 packs per day. She does not have any smokeless tobacco history on file. She reports that she does not drink alcohol.   Family History:  The patient's family history includes Heart failure in her mother; Lung cancer in her brother and sister; Stomach cancer in her brother.    ROS:  Please see the history of present illness.      All other systems reviewed and negative.    PHYSICAL EXAM: VS:  BP 160/90 mmHg  Pulse 72  Ht 5\' 8"  (1.727 m)  Wt 155 lb (70.308 kg)  BMI 23.57 kg/m2  SpO2 98% Well nourished, well developed, in no acute distress HEENT: normal Neck: no JVD Cardiac:  normal S1, S2;  RRR; no murmur   Lungs:   clear to auscultation bilaterally, no wheezing, rhonchi or rales Abd: soft, nontender, no hepatomegaly Ext: trace bilateral ankle edema Skin: warm and dry Neuro:  CNs 2-12 intact, no focal abnormalities noted   ASSESSMENT AND PLAN:   1.  Malignant Hypertension:  BP remains elevated.  She checks her BPs at home and they sometimes run higher.      -  Increase Clonidine to 0.2 mg bid.  2.  Chronic  Systolic CHF:  Volume appears stable.  She can take an extra 20 mg Lasix if she has worsening edema or weight gain. 3.  Ischemic CM:    She is not on ACEI due to advanced CKD. She is intolerant of hydralazine. She reports some intolerance of beta blocker therapy, but is still taking Toprol-XL.  I have recommended she remain on this if she can. 4.  Coronary Artery Disease:  No angina.  She in on Plavix due to ASA allergy and she is statin intolerant.  5.  Chronic Kidney Disease:  Repeat BMET today.  02/01/2014: Creatinine 2.09*    Disposition:   FU with Dr. Sherren Mocha 08/2014 as planned.    Signed, Versie Starks, MHS 05/16/2014 1:43 PM    [facility] [place], [place], [place]  [address] Phone: [phone]; Fax: [phone]

## 2014-05-17 ENCOUNTER — Telehealth: Payer: Self-pay | Admitting: *Deleted

## 2014-05-17 NOTE — Telephone Encounter (Signed)
lmptcb to go over lab results 

## 2014-05-18 ENCOUNTER — Other Ambulatory Visit: Payer: Self-pay | Admitting: *Deleted

## 2014-05-18 DIAGNOSIS — N183 Chronic kidney disease, stage 3 unspecified: Secondary | ICD-10-CM

## 2014-05-18 NOTE — Telephone Encounter (Signed)
pt notified about lab results with verbal understanding. pt said she will cb later to schedule lab for next week, she had an appt today that may affect lab appt next week.

## 2014-06-02 ENCOUNTER — Telehealth: Payer: Self-pay | Admitting: Physician Assistant

## 2014-06-02 NOTE — Telephone Encounter (Signed)
New message      Talk to [place].  She states her bp is 128/67 and her HR is 50.  Is this good?

## 2014-06-08 NOTE — Telephone Encounter (Signed)
I lmom per [place] BP and HR readings looks good.

## 2014-06-27 ENCOUNTER — Other Ambulatory Visit (HOSPITAL_BASED_OUTPATIENT_CLINIC_OR_DEPARTMENT_OTHER): Payer: Medicare Other

## 2014-06-27 ENCOUNTER — Encounter (HOSPITAL_COMMUNITY): Payer: Self-pay

## 2014-06-27 ENCOUNTER — Ambulatory Visit (HOSPITAL_COMMUNITY)
Admission: RE | Admit: 2014-06-27 | Discharge: 2014-06-27 | Disposition: A | Discharge status: Home / Self Care | Payer: Medicare Other | Source: Ambulatory Visit | Attending: Internal Medicine | Admitting: Internal Medicine

## 2014-06-27 DIAGNOSIS — C3491 Malignant neoplasm of unspecified part of right bronchus or lung: Secondary | ICD-10-CM | POA: Insufficient documentation

## 2014-06-27 DIAGNOSIS — R911 Solitary pulmonary nodule: Secondary | ICD-10-CM | POA: Diagnosis not present

## 2014-06-27 DIAGNOSIS — Z08 Encounter for follow-up examination after completed treatment for malignant neoplasm: Secondary | ICD-10-CM | POA: Diagnosis not present

## 2014-06-27 DIAGNOSIS — Z9221 Personal history of antineoplastic chemotherapy: Secondary | ICD-10-CM | POA: Insufficient documentation

## 2014-06-27 DIAGNOSIS — Z85118 Personal history of other malignant neoplasm of bronchus and lung: Secondary | ICD-10-CM

## 2014-06-27 LAB — CBC WITH DIFFERENTIAL/PLATELET
BASO%: 0.5 % (ref 0.0–2.0)
Basophils Absolute: 0 10*3/uL (ref 0.0–0.1)
EOS%: 4.8 % (ref 0.0–7.0)
Eosinophils Absolute: 0.3 10*3/uL (ref 0.0–0.5)
HEMATOCRIT: 39.6 % (ref 34.8–46.6)
HGB: 12.6 g/dL (ref 11.6–15.9)
LYMPH%: 13.2 % — AB (ref 14.0–49.7)
MCH: 26.3 pg (ref 25.1–34.0)
MCHC: 31.8 g/dL (ref 31.5–36.0)
MCV: 82.5 fL (ref 79.5–101.0)
MONO#: 0.6 10*3/uL (ref 0.1–0.9)
MONO%: 10 % (ref 0.0–14.0)
NEUT#: 4.5 10*3/uL (ref 1.5–6.5)
NEUT%: 71.5 % (ref 38.4–76.8)
PLATELETS: 257 10*3/uL (ref 145–400)
RBC: 4.8 10*6/uL (ref 3.70–5.45)
RDW: 13.3 % (ref 11.2–14.5)
WBC: 6.3 10*3/uL (ref 3.9–10.3)
lymph#: 0.8 10*3/uL — ABNORMAL LOW (ref 0.9–3.3)

## 2014-06-27 LAB — COMPREHENSIVE METABOLIC PANEL (CC13)
ALBUMIN: 3.3 g/dL — AB (ref 3.5–5.0)
ALT: 7 U/L (ref 0–55)
AST: 13 U/L (ref 5–34)
Alkaline Phosphatase: 125 U/L (ref 40–150)
Anion Gap: 13 mEq/L — ABNORMAL HIGH (ref 3–11)
BILIRUBIN TOTAL: 0.51 mg/dL (ref 0.20–1.20)
BUN: 25.8 mg/dL (ref 7.0–26.0)
CO2: 28 meq/L (ref 22–29)
CREATININE: 2.3 mg/dL — AB (ref 0.6–1.1)
Calcium: 8.9 mg/dL (ref 8.4–10.4)
Chloride: 98 mEq/L (ref 98–109)
EGFR: 20 mL/min/{1.73_m2} — ABNORMAL LOW (ref >90)
Glucose: 105 mg/dl (ref 70–140)
Potassium: 3.8 mEq/L (ref 3.5–5.1)
SODIUM: 139 meq/L (ref 136–145)
TOTAL PROTEIN: 7.2 g/dL (ref 6.4–8.3)

## 2014-07-04 ENCOUNTER — Ambulatory Visit (HOSPITAL_BASED_OUTPATIENT_CLINIC_OR_DEPARTMENT_OTHER): Payer: Medicare Other | Admitting: Internal Medicine

## 2014-07-04 ENCOUNTER — Encounter: Payer: Self-pay | Admitting: Internal Medicine

## 2014-07-04 VITALS — BP 192/70 | HR 73 | Temp 98.3°F | Resp 18 | Ht 68.0 in | Wt 152.3 lb

## 2014-07-04 DIAGNOSIS — C3491 Malignant neoplasm of unspecified part of right bronchus or lung: Secondary | ICD-10-CM

## 2014-07-04 DIAGNOSIS — R911 Solitary pulmonary nodule: Secondary | ICD-10-CM

## 2014-07-04 DIAGNOSIS — Z85118 Personal history of other malignant neoplasm of bronchus and lung: Secondary | ICD-10-CM

## 2014-07-04 NOTE — Progress Notes (Signed)
[facility] [phone]   Fax:([phone]) [place], MD  [place] [address]  PRINCIPAL DIAGNOSIS: Stage IV non-small cell lung cancer diagnosed in March 2007.   PRIOR THERAPY: Status post 6 cycles of systemic chemotherapy with carboplatin and docetaxel. Last dose was given January 16, 2006.   CURRENT THERAPY: Observation.  DISEASE STAGE: Stage IV non-small cell lung cancer diagnosed in March of 2007.  CHEMOTHERAPY INTENT: Palliative  CURRENT # OF CHEMOTHERAPY CYCLES: 0  CURRENT ANTIEMETICS: None  CURRENT SMOKING STATUS: Nonsmoker  ORAL CHEMOTHERAPY AND CONSENT: None  CURRENT BISPHOSPHONATES USE: None  LIVING WILL AND CODE STATUS: Full code  INTERVAL HISTORY: [name] 81 y.o. female returns to the clinic today for six-month followup visit accompanied by her daughter. The patient is feeling fine today with no specific complaints. She denied having any significant chest pain, shortness of breath, cough or hemoptysis. The patient denied having any weight loss or night sweats. She had repeat CT scan of the chest performed recently and she is here for evaluation and discussion of her scan results.  MEDICAL HISTORY: Past Medical History  Diagnosis Date  . Hypertension   . Hypercholesterolemia   . CKD (chronic kidney disease)   . Coronary artery disease     a.  LHC (06/04/05): LHC done in [place] with high grade RCA => s/p BMS to RCA;  b.  Nuclear (09/14/09): Lexiscan; Inf infarct with mild peri-infarct ishemia, EF 52%; Low Risk.  Marland Kitchen GERD (gastroesophageal reflux disease)   . Chronic anemia   . Ischemic cardiomyopathy     a. Echo (07/26/13): Mild LVH, EF 35-40%, diff HK, inf AK, Gr 2 DD, Tr AI, mildly dilated Ao root, MAC, mild MR, mild LAE, mod reduced RVSF.  . Non-small cell carcinoma of lung     Stage IV    ALLERGIES:  is allergic to amlodipine; aspirin; cephalexin; ciprofloxacin;  hydralazine; iron; penicillins; statins; and sulfonamide derivatives.  MEDICATIONS:  Current Outpatient Prescriptions  Medication Sig Dispense Refill  . cloNIDine (CATAPRES) 0.2 MG tablet Take 1 tablet (0.2 mg total) by mouth 2 (two) times daily. 60 tablet 11  . clopidogrel (PLAVIX) 75 MG tablet TAKE ONE TABLET DAILY. 90 tablet 3  . furosemide (LASIX) 40 MG tablet Take 1 tablet (40 mg total) by mouth daily. 90 tablet 3  . metoprolol succinate (TOPROL-XL) 25 MG 24 hr tablet Take 0.5 tablets (12.5 mg total) by mouth at bedtime. 15 tablet 6  . pantoprazole (PROTONIX) 40 MG tablet TAKE ONE TABLET BY MOUTH EVERY DAY 90 tablet 3   No current facility-administered medications for this visit.    SURGICAL HISTORY:  Past Surgical History  Procedure Laterality Date  . Hematoma evacuation  December 2006    groin  . Appendectomy    . Cataract extraction    . Hemorroidectomy    . Laminectomy      REVIEW OF SYSTEMS:  A comprehensive review of systems was negative.   PHYSICAL EXAMINATION: General appearance: alert, cooperative and no distress Head: Normocephalic, without obvious abnormality, atraumatic Neck: no adenopathy Lymph nodes: Cervical, supraclavicular, and axillary nodes normal. Resp: clear to auscultation bilaterally Cardio: regular rate and rhythm, S1, S2 normal, no murmur, click, rub or gallop GI: soft, non-tender; bowel sounds normal; no masses,  no organomegaly Extremities: extremities normal, atraumatic, no cyanosis or edema  ECOG PERFORMANCE STATUS: 1 - Symptomatic but completely ambulatory  Blood  pressure 192/70, pulse 73, temperature 98.3 F (36.8 C), temperature source Oral, resp. rate 18, height 5\' 8"  (1.727 m), weight 152 lb 4.8 oz (69.083 kg), SpO2 97 %.  LABORATORY DATA: Lab Results  Component Value Date   WBC 6.3 06/27/2014   HGB 12.6 06/27/2014   HCT 39.6 06/27/2014   MCV 82.5 06/27/2014   PLT 257 06/27/2014      Chemistry      Component Value Date/Time     NA 139 06/27/2014 1400   NA 138 05/16/2014 1459   NA 143 12/23/2011 0830   K 3.8 06/27/2014 1400   K 3.2* 05/16/2014 1459   K 4.7 12/23/2011 0830   CL 99 05/16/2014 1459   CL 104 06/19/2012 0958   CL 101 12/23/2011 0830   CO2 28 06/27/2014 1400   CO2 30 05/16/2014 1459   CO2 27 12/23/2011 0830   BUN 25.8 06/27/2014 1400   BUN 40* 05/16/2014 1459   BUN 25* 12/23/2011 0830   CREATININE 2.3* 06/27/2014 1400   CREATININE 2.4* 05/16/2014 1459   CREATININE 2.09* 02/01/2014 1545      Component Value Date/Time   CALCIUM 8.9 06/27/2014 1400   CALCIUM 9.3 05/16/2014 1459   CALCIUM 9.0 12/23/2011 0830   ALKPHOS 125 06/27/2014 1400   ALKPHOS 76 12/23/2011 0830   ALKPHOS 91 04/08/2011 1023   AST 13 06/27/2014 1400   AST 16 12/23/2011 0830   AST 17 04/08/2011 1023   ALT 7 06/27/2014 1400   ALT 16 12/23/2011 0830   ALT 10 04/08/2011 1023   BILITOT 0.51 06/27/2014 1400   BILITOT 0.60 12/23/2011 0830   BILITOT 0.4 04/08/2011 1023       RADIOGRAPHIC STUDIES: Ct Chest Wo Contrast  06/27/2014   CLINICAL DATA:  Right lung cancer, diagnosed 08/2005, chemotherapy complete. Restaging.  EXAM: CT CHEST WITHOUT CONTRAST  TECHNIQUE: Multidetector CT imaging of the chest was performed following the standard protocol without IV contrast.  COMPARISON:  Multiple CTs, including 12/30/2013 and 04/08/2011.  FINDINGS: Mediastinum/Nodes: Heart is normal in size. No pericardial effusion.  Atherosclerotic calcifications of the aortic arch. Left main and 3 vessel coronary atherosclerosis.  Small mediastinal nodes, including an 8 mm short axis left paratracheal node (series 2/ image 22) and a 7 mm short axis subcarinal node (series 2/image 31).  Small bilateral thyroid nodules measuring up to 10 mm (series 2/ image 7), unchanged.  Lungs/Pleura: 12 x 11 x 9 mm spiculated nodule in the lateral left upper lobe (series 5/image 22), unchanged from recent CTs, although increased from 2012 when it measured 11 x 8 x 6  mm.  Underlying moderate centrilobular and paraseptal emphysematous changes.  Linear scarring in the right lower lobe. Mild scarring/ atelectasis in the lingula. Stable 10 mm nodular opacity at the left lung base (series 5/image 54), unchanged since 2012, benign. Additional stable 4-5 mm nodular opacity in the posterior right upper lobe (series 5/ image 32). Mild nodular scarring at the right lung apex (series 5/image 7).  No pleural effusion or pneumothorax.  Upper abdomen: Visualized upper abdomen is notable for vascular calcifications, cholecystectomy clips, and bilateral renal cysts.  Musculoskeletal: Degenerative changes of the visualized thoracolumbar spine.  IMPRESSION: 12 x 11 x 9 mm spiculated nodule in the lateral left upper lobe, previously 11 x 8 x 6 mm in 2012, worrisome for indolent primary bronchogenic neoplasm.  Additional stable ancillary findings as above.   Electronically Signed   By: Julian Hy M.D.   On:  06/27/2014 15:34    ASSESSMENT AND PLAN: This is a very pleasant 81 years old white female with history of stage IV non-small cell lung cancer diagnosed in March of 2007 status post 6 cycles of systemic chemotherapy with carboplatin and docetaxel and has been observation since that time with no evidence for disease progression.  The CT scan showed slight increase in the size of the spiculated nodule in the left upper lobe which increased by 2-3 millimeter compared to the previous scan. I discussed the scan results with the patient and her daughter and showed them the images.  I recommended for her to continue on observation with repeat CT scan of the chest without contrast in 6 months. The patient was advised to call immediately if she has any concerning symptoms in the interval.  All questions were answered. The patient knows to call the clinic with any problems, questions or concerns. We can certainly see the patient much sooner if necessary.  Disclaimer: This note was  dictated with voice recognition software. Similar sounding words can inadvertently be transcribed and may not be corrected upon review.

## 2014-07-05 ENCOUNTER — Telehealth: Payer: Self-pay | Admitting: Internal Medicine

## 2014-07-05 NOTE — Telephone Encounter (Addendum)
Pt confirmed labs/ov per 02/01 POF, gave pt AVS.... KJ

## 2014-07-28 ENCOUNTER — Observation Stay (HOSPITAL_COMMUNITY)
Admission: EM | Admit: 2014-07-28 | Discharge: 2014-07-29 | Disposition: A | Discharge status: Home / Self Care | Payer: Medicare Other | Attending: Internal Medicine | Admitting: Internal Medicine

## 2014-07-28 ENCOUNTER — Emergency Department (HOSPITAL_COMMUNITY): Payer: Medicare Other

## 2014-07-28 ENCOUNTER — Encounter (HOSPITAL_COMMUNITY): Payer: Self-pay | Admitting: Emergency Medicine

## 2014-07-28 DIAGNOSIS — Z880 Allergy status to penicillin: Secondary | ICD-10-CM | POA: Insufficient documentation

## 2014-07-28 DIAGNOSIS — I639 Cerebral infarction, unspecified: Secondary | ICD-10-CM | POA: Diagnosis not present

## 2014-07-28 DIAGNOSIS — D649 Anemia, unspecified: Secondary | ICD-10-CM | POA: Diagnosis not present

## 2014-07-28 DIAGNOSIS — Z79899 Other long term (current) drug therapy: Secondary | ICD-10-CM | POA: Diagnosis not present

## 2014-07-28 DIAGNOSIS — I2510 Atherosclerotic heart disease of native coronary artery without angina pectoris: Secondary | ICD-10-CM | POA: Insufficient documentation

## 2014-07-28 DIAGNOSIS — K219 Gastro-esophageal reflux disease without esophagitis: Secondary | ICD-10-CM | POA: Diagnosis not present

## 2014-07-28 DIAGNOSIS — N390 Urinary tract infection, site not specified: Secondary | ICD-10-CM | POA: Insufficient documentation

## 2014-07-28 DIAGNOSIS — Z87891 Personal history of nicotine dependence: Secondary | ICD-10-CM | POA: Diagnosis not present

## 2014-07-28 DIAGNOSIS — I129 Hypertensive chronic kidney disease with stage 1 through stage 4 chronic kidney disease, or unspecified chronic kidney disease: Secondary | ICD-10-CM | POA: Insufficient documentation

## 2014-07-28 DIAGNOSIS — N289 Disorder of kidney and ureter, unspecified: Secondary | ICD-10-CM

## 2014-07-28 DIAGNOSIS — E780 Pure hypercholesterolemia: Secondary | ICD-10-CM | POA: Insufficient documentation

## 2014-07-28 DIAGNOSIS — Z7902 Long term (current) use of antithrombotics/antiplatelets: Secondary | ICD-10-CM | POA: Insufficient documentation

## 2014-07-28 DIAGNOSIS — N189 Chronic kidney disease, unspecified: Secondary | ICD-10-CM | POA: Diagnosis not present

## 2014-07-28 DIAGNOSIS — M6281 Muscle weakness (generalized): Secondary | ICD-10-CM | POA: Diagnosis present

## 2014-07-28 DIAGNOSIS — G459 Transient cerebral ischemic attack, unspecified: Secondary | ICD-10-CM

## 2014-07-28 DIAGNOSIS — I255 Ischemic cardiomyopathy: Secondary | ICD-10-CM | POA: Insufficient documentation

## 2014-07-28 LAB — BASIC METABOLIC PANEL
ANION GAP: 7 (ref 5–15)
BUN: 35 mg/dL — ABNORMAL HIGH (ref 6–23)
CHLORIDE: 101 mmol/L (ref 96–112)
CO2: 27 mmol/L (ref 19–32)
Calcium: 8.5 mg/dL (ref 8.4–10.5)
Creatinine, Ser: 2.2 mg/dL — ABNORMAL HIGH (ref 0.50–1.10)
GFR calc Af Amer: 23 mL/min — ABNORMAL LOW (ref >90)
GFR calc non Af Amer: 20 mL/min — ABNORMAL LOW (ref >90)
GLUCOSE: 115 mg/dL — AB (ref 70–99)
POTASSIUM: 3.5 mmol/L (ref 3.5–5.1)
Sodium: 135 mmol/L (ref 135–145)

## 2014-07-28 LAB — CBC WITH DIFFERENTIAL/PLATELET
Basophils Absolute: 0 10*3/uL (ref 0.0–0.1)
Basophils Relative: 0 % (ref 0–1)
EOS ABS: 0.2 10*3/uL (ref 0.0–0.7)
Eosinophils Relative: 2 % (ref 0–5)
HCT: 37.4 % (ref 36.0–46.0)
Hemoglobin: 12 g/dL (ref 12.0–15.0)
Lymphocytes Relative: 11 % — ABNORMAL LOW (ref 12–46)
Lymphs Abs: 0.8 10*3/uL (ref 0.7–4.0)
MCH: 26.4 pg (ref 26.0–34.0)
MCHC: 32.1 g/dL (ref 30.0–36.0)
MCV: 82.4 fL (ref 78.0–100.0)
Monocytes Absolute: 0.5 10*3/uL (ref 0.1–1.0)
Monocytes Relative: 7 % (ref 3–12)
Neutro Abs: 5.8 10*3/uL (ref 1.7–7.7)
Neutrophils Relative %: 80 % — ABNORMAL HIGH (ref 43–77)
PLATELETS: 249 10*3/uL (ref 150–400)
RBC: 4.54 MIL/uL (ref 3.87–5.11)
RDW: 13.6 % (ref 11.5–15.5)
WBC: 7.4 10*3/uL (ref 4.0–10.5)

## 2014-07-28 NOTE — ED Provider Notes (Signed)
CSN: [id number]     Arrival date & time 07/28/14  2202 History  This chart was scribed for [name] Fuel, MD by Randa Evens, ED Scribe. This patient was seen in room APA02/APA02 and the patient's care was started at 11:03 PM.      Chief Complaint  Patient presents with  . Extremity Weakness  . Hypertension   The history is provided by the patient. No language interpreter was used.   HPI Comments: [name] is a 81 y.o. female who presents to the Emergency Department complaining of left leg weakness onset today at 6 PM. Pt states she feels as if the left leg is useless. Pt doesn't report any associated symptoms. Pt doesn't report any alleviating or worsening factors. Pt states that she normally ambulates with no assistance but has been using a cane to help her ambulate today or as needed. Pt states that her BP is elevated as well. Denies numbness, HA, chest pain, chest tightness or nausea. Pt states she has a Hx of her knees giving out on her.    Past Medical History  Diagnosis Date  . Hypertension   . Hypercholesterolemia   . CKD (chronic kidney disease)   . Coronary artery disease     a.  LHC (06/04/05): LHC done in [place] with high grade RCA => s/p BMS to RCA;  b.  Nuclear (09/14/09): Lexiscan; Inf infarct with mild peri-infarct ishemia, EF 52%; Low Risk.  Marland Kitchen GERD (gastroesophageal reflux disease)   . Chronic anemia   . Ischemic cardiomyopathy     a. Echo (07/26/13): Mild LVH, EF 35-40%, diff HK, inf AK, Gr 2 DD, Tr AI, mildly dilated Ao root, MAC, mild MR, mild LAE, mod reduced RVSF.  . Non-small cell carcinoma of lung     Stage IV   Past Surgical History  Procedure Laterality Date  . Hematoma evacuation  December 2006    groin  . Appendectomy    . Cataract extraction    . Hemorroidectomy    . Laminectomy     Family History  Problem Relation Age of Onset  . Heart failure Mother   . Lung cancer Sister   . Lung cancer Brother   . Stomach cancer Brother   .  Heart attack Neg Hx   . Stroke Neg Hx    History  Substance Use Topics  . Smoking status: Former Smoker    Types: Cigarettes  . Smokeless tobacco: Not on file  . Alcohol Use: No   OB History    No data available      Review of Systems  Respiratory: Negative for chest tightness.   Cardiovascular: Negative for chest pain.  Gastrointestinal: Negative for nausea.  Neurological: Positive for weakness. Negative for numbness and headaches.  All other systems reviewed and are negative.    Allergies  Amlodipine; Aspirin; Cephalexin; Ciprofloxacin; Hydralazine; Iron; Penicillins; Statins; and Sulfonamide derivatives  Home Medications   Prior to Admission medications   Medication Sig Start Date End Date Taking? Authorizing Provider  cloNIDine (CATAPRES) 0.2 MG tablet Take 1 tablet (0.2 mg total) by mouth 2 (two) times daily. 05/16/14  Yes Liliane Shi, PA-C  clopidogrel (PLAVIX) 75 MG tablet TAKE ONE TABLET DAILY. 04/06/14  Yes Blane Ohara, MD  furosemide (LASIX) 40 MG tablet Take 1 tablet (40 mg total) by mouth daily. 02/03/14  Yes Blane Ohara, MD  ibuprofen (ADVIL,MOTRIN) 200 MG tablet Take 400 mg by mouth daily as needed for headache, mild  pain or moderate pain.   Yes Historical Provider, MD  metoprolol succinate (TOPROL-XL) 25 MG 24 hr tablet Take 0.5 tablets (12.5 mg total) by mouth at bedtime. 01/31/14  Yes Blane Ohara, MD  pantoprazole (PROTONIX) 40 MG tablet TAKE ONE TABLET BY MOUTH EVERY DAY 11/03/13  Yes Blane Ohara, MD   BP 187/77 mmHg  Pulse 67  Temp(Src) 97.8 F (36.6 C) (Oral)  Resp 18  Ht 5\' 8"  (1.727 m)  Wt 152 lb (68.947 kg)  BMI 23.12 kg/m2  SpO2 98%   Physical Exam  Constitutional: She is oriented to person, place, and time. She appears well-developed and well-nourished. No distress.  HENT:  Head: Normocephalic and atraumatic.  Eyes: Conjunctivae and EOM are normal. Pupils are equal, round, and reactive to light.  Fundi show no hemorrhage,  exudate, or papilledema.  Neck: Neck supple. No JVD present. Carotid bruit is not present. No tracheal deviation present.  Cardiovascular: Normal rate, regular rhythm and normal heart sounds.   No murmur heard. Pulmonary/Chest: Effort normal and breath sounds normal. She has no wheezes. She has no rales. She exhibits no tenderness.  Abdominal: Soft. Bowel sounds are normal. She exhibits no distension and no mass. There is no tenderness.  Musculoskeletal: Normal range of motion. She exhibits edema.  2+ edema.  Neurological: She is alert and oriented to person, place, and time. No cranial nerve deficit.  Mild weakness of left arm and leg, left arm 4+/5, left leg 4/5, normal finger to nose, Romberg falls to left.   Skin: Skin is warm and dry. No rash noted.  Psychiatric: She has a normal mood and affect. Her behavior is normal. Thought content normal.  Nursing note and vitals reviewed.   ED Course  Procedures (including critical care time) DIAGNOSTIC STUDIES: Oxygen Saturation is 98% on RA, normal by my interpretation.    COORDINATION OF CARE: 11:23 PM-Discussed treatment plan with pt at bedside and pt agreed to plan.     Labs Review Results for orders placed or performed during the hospital encounter of 07/28/14  CBC with Differential  Result Value Ref Range   WBC 7.4 4.0 - 10.5 K/uL   RBC 4.54 3.87 - 5.11 MIL/uL   Hemoglobin 12.0 12.0 - 15.0 g/dL   HCT 37.4 36.0 - 46.0 %   MCV 82.4 78.0 - 100.0 fL   MCH 26.4 26.0 - 34.0 pg   MCHC 32.1 30.0 - 36.0 g/dL   RDW 13.6 11.5 - 15.5 %   Platelets 249 150 - 400 K/uL   Neutrophils Relative % 80 (H) 43 - 77 %   Neutro Abs 5.8 1.7 - 7.7 K/uL   Lymphocytes Relative 11 (L) 12 - 46 %   Lymphs Abs 0.8 0.7 - 4.0 K/uL   Monocytes Relative 7 3 - 12 %   Monocytes Absolute 0.5 0.1 - 1.0 K/uL   Eosinophils Relative 2 0 - 5 %   Eosinophils Absolute 0.2 0.0 - 0.7 K/uL   Basophils Relative 0 0 - 1 %   Basophils Absolute 0.0 0.0 - 0.1 K/uL  Basic  metabolic panel  Result Value Ref Range   Sodium 135 135 - 145 mmol/L   Potassium 3.5 3.5 - 5.1 mmol/L   Chloride 101 96 - 112 mmol/L   CO2 27 19 - 32 mmol/L   Glucose, Bld 115 (H) 70 - 99 mg/dL   BUN 35 (H) 6 - 23 mg/dL   Creatinine, Ser 2.20 (H) 0.50 - 1.10 mg/dL  Calcium 8.5 8.4 - 10.5 mg/dL   GFR calc non Af Amer 20 (L) >90 mL/min   GFR calc Af Amer 23 (L) >90 mL/min   Anion gap 7 5 - 15  Troponin I  Result Value Ref Range   Troponin I 0.03 <0.031 ng/mL  Urinalysis, Routine w reflex microscopic  Result Value Ref Range   Color, Urine YELLOW YELLOW   APPearance CLEAR CLEAR   Specific Gravity, Urine 1.010 1.005 - 1.030   pH 6.5 5.0 - 8.0   Glucose, UA NEGATIVE NEGATIVE mg/dL   Hgb urine dipstick SMALL (A) NEGATIVE   Bilirubin Urine NEGATIVE NEGATIVE   Ketones, ur NEGATIVE NEGATIVE mg/dL   Protein, ur NEGATIVE NEGATIVE mg/dL   Urobilinogen, UA 0.2 0.0 - 1.0 mg/dL   Nitrite NEGATIVE NEGATIVE   Leukocytes, UA LARGE (A) NEGATIVE  Urine microscopic-add on  Result Value Ref Range   Squamous Epithelial / LPF FEW (A) RARE   WBC, UA TOO NUMEROUS TO COUNT <3 WBC/hpf   RBC / HPF 3-6 <3 RBC/hpf   Bacteria, UA MANY (A) RARE    Imaging Review Ct Head (brain) Wo Contrast  07/28/2014   CLINICAL DATA:  Left leg weakness today.  EXAM: CT HEAD WITHOUT CONTRAST  TECHNIQUE: Contiguous axial images were obtained from the base of the skull through the vertex without intravenous contrast.  COMPARISON:  MRI brain 09/18/2005  FINDINGS: Diffuse cerebral atrophy. Low-attenuation changes in the deep white matter consistent with small vessel ischemia. No mass effect or midline shift. No abnormal extra-axial fluid collections. Gray-white matter junctions are distinct. Basal cisterns are not effaced. No evidence of acute intracranial hemorrhage. No depressed skull fractures. Visualized paranasal sinuses and mastoid air cells are not opacified.  IMPRESSION: No acute intracranial abnormalities. Chronic  atrophy and small vessel ischemic changes.   Electronically Signed   By: Lucienne Capers M.D.   On: 07/28/2014 23:41     EKG Interpretation   Date/Time:  Thursday July 28 2014 22:26:59 EST Ventricular Rate:  59 PR Interval:  267 QRS Duration: 96 QT Interval:  513 QTC Calculation: 508 R Axis:   -17 Text Interpretation:  Sinus rhythm Prolonged PR interval Borderline left  axis deviation Non-specific ST-t changes Prolonged QT interval Confirmed  by Wilson Singer  MD, [place] ([id number]) on 07/28/2014 10:54:45 PM      MDM   Final diagnoses:  Stroke  Renal insufficiency  Urinary tract infection without hematuria, site unspecified       Apparent stroke with mild left-sided weakness. She is outside the window for thrombolytic therapy and symptoms are too mild to warrant thrombolytic therapy. [facility] neurology consultation was obtained and did not find any ongoing neurologic deficits but did not have the patient stand up to do Romberg test. Apparently, patient is going to refuse MRI and MRA. Consultant recommends repeat CT scan and consideration for CT of cervical spine is well. Case is discussed with Dr. Ernestina Patches of [facility] who agrees to admit the patient.   I personally performed the services described in this documentation, which was scribed in my presence. The recorded information has been reviewed and is accurate.       [name] Fuel, MD 76/54/65 [id number]

## 2014-07-28 NOTE — ED Notes (Signed)
Pt states she had a busy day, state her left leg does not feel right, weakness, left arm felt funny, family called EMS  BP was 218/103, pt drank ice water and took BP med.  Pt states her left leg still feels useless.

## 2014-07-28 NOTE — ED Notes (Signed)
This event happened at 6pm

## 2014-07-29 ENCOUNTER — Observation Stay (HOSPITAL_COMMUNITY): Payer: Medicare Other

## 2014-07-29 DIAGNOSIS — N183 Chronic kidney disease, stage 3 (moderate): Secondary | ICD-10-CM

## 2014-07-29 DIAGNOSIS — G459 Transient cerebral ischemic attack, unspecified: Secondary | ICD-10-CM

## 2014-07-29 DIAGNOSIS — N390 Urinary tract infection, site not specified: Secondary | ICD-10-CM

## 2014-07-29 DIAGNOSIS — I10 Essential (primary) hypertension: Secondary | ICD-10-CM

## 2014-07-29 LAB — LIPID PANEL
CHOL/HDL RATIO: 7.8 ratio
Cholesterol: 248 mg/dL — ABNORMAL HIGH (ref 0–200)
HDL: 32 mg/dL — AB (ref >39)
LDL CALC: 180 mg/dL — AB (ref 0–99)
Triglycerides: 179 mg/dL — ABNORMAL HIGH (ref <150)
VLDL: 36 mg/dL (ref 0–40)

## 2014-07-29 LAB — URINALYSIS, ROUTINE W REFLEX MICROSCOPIC
Bilirubin Urine: NEGATIVE
Glucose, UA: NEGATIVE mg/dL
Ketones, ur: NEGATIVE mg/dL
Nitrite: NEGATIVE
PROTEIN: NEGATIVE mg/dL
SPECIFIC GRAVITY, URINE: 1.01 (ref 1.005–1.030)
Urobilinogen, UA: 0.2 mg/dL (ref 0.0–1.0)
pH: 6.5 (ref 5.0–8.0)

## 2014-07-29 LAB — URINE MICROSCOPIC-ADD ON

## 2014-07-29 LAB — MRSA PCR SCREENING: MRSA BY PCR: NEGATIVE

## 2014-07-29 LAB — TROPONIN I: Troponin I: 0.03 ng/mL (ref <0.031)

## 2014-07-29 MED ORDER — ACETAMINOPHEN 325 MG PO TABS
650.0000 mg | ORAL_TABLET | Freq: Once | ORAL | Status: AC
  Administered 2014-07-29: 650 mg via ORAL
  Filled 2014-07-29: qty 2

## 2014-07-29 MED ORDER — STROKE: EARLY STAGES OF RECOVERY BOOK
Freq: Once | Status: DC
  Filled 2014-07-29: qty 1

## 2014-07-29 MED ORDER — HEPARIN SODIUM (PORCINE) 5000 UNIT/ML IJ SOLN
5000.0000 [IU] | Freq: Three times a day (TID) | INTRAMUSCULAR | Status: DC
  Administered 2014-07-29 (×2): 5000 [IU] via SUBCUTANEOUS
  Filled 2014-07-29 (×2): qty 1

## 2014-07-29 MED ORDER — HYDROCORTISONE 1 % EX CREA: 1.0000 "application " | TOPICAL_CREAM | Freq: Two times a day (BID) | CUTANEOUS | Status: DC

## 2014-07-29 MED ORDER — ACETAMINOPHEN 325 MG PO TABS
650.0000 mg | ORAL_TABLET | Freq: Four times a day (QID) | ORAL | Status: DC | PRN
  Administered 2014-07-29: 650 mg via ORAL
  Filled 2014-07-29: qty 2

## 2014-07-29 MED ORDER — NITROFURANTOIN MONOHYD MACRO 100 MG PO CAPS: 100.0000 mg | ORAL_CAPSULE | Freq: Two times a day (BID) | ORAL | Status: DC

## 2014-07-29 MED ORDER — CLONIDINE HCL 0.2 MG PO TABS
0.2000 mg | ORAL_TABLET | Freq: Two times a day (BID) | ORAL | Status: DC
  Administered 2014-07-29: 0.2 mg via ORAL
  Filled 2014-07-29: qty 1

## 2014-07-29 NOTE — Progress Notes (Signed)
UR completed 

## 2014-07-29 NOTE — Evaluation (Signed)
Occupational Therapy Evaluation Patient Details Name: [name] MRN: [id number] DOB: Nov 06, 1933 Today's Date: 07/29/2014    History of Present Illness History of Present Illness:This is a 81 y.o. year old female with significant past medical history of non small cell lung cancer in remission, CAD, ischemic cardiomyopathy, HTN presenting with TIA. Pt states that she went to get groceries earlier in the afternoon. Noticed L sided weakness coming home, putting away groceries. Sxs persisted for several minutes. Pt contacted daughter and subsequently called EMS. Was directed to ER for further evaluation.    Clinical Impression   Pt has no Left side strength or coordination deficits. Any weakness that she had experienced has resolved. Pt is at baseline. Does have concerns with balance and ambulation that started 6 months ago. Spoke with PT to inform her of issues. Let patient know that [facility] has an outpatient rehab facility that is available to her that may be more convenient than driving to [place]. No further OT needs at this time.     Follow Up Recommendations  No OT follow up    Equipment Recommendations  None recommended by OT    Recommendations for Other Services PT consult     Precautions / Restrictions Precautions Precautions: Fall Restrictions Weight Bearing Restrictions: No      Mobility Bed Mobility Overal bed mobility: Independent                Transfers   Equipment used: None             General transfer comment: Close supervision to walk due to patient's nerviousness of falling. patient has a cautious  almost staggering walk that she reports has started about 6 months ago.          ADL Overall ADL's : At baseline                         Toilet Transfer: Sales executive;Ambulation Toilet Transfer Details (indicate cue type and reason): Patient ambulated to bathroom if a staggering cautious gait. Pt  reports that this change if gait had happened about 6 months ago.            General ADL Comments: Patient is able to donn/doff socks independently.                Pertinent Vitals/Pain Pain Assessment: No/denies pain     Hand Dominance Right   Extremity/Trunk Assessment Upper Extremity Assessment Upper Extremity Assessment: Overall WFL for tasks assessed   Lower Extremity Assessment Lower Extremity Assessment: Defer to PT evaluation       Communication Communication Communication: No difficulties   Cognition Arousal/Alertness: Awake/alert Behavior During Therapy: WFL for tasks assessed/performed Overall Cognitive Status: Within Functional Limits for tasks assessed                                Home Living Family/patient expects to be discharged to:: Private residence Living Arrangements: Alone   Type of Home: House Home Access: Stairs to enter [employer] of Steps: 4 in the back Entrance Stairs-Rails: Right;Left Home Layout: One level     Bathroom Shower/Tub: Occupational psychologist: Standard     Home Equipment: Cane - single point;Walker - standard          Prior Functioning/Environment Level of Independence: Independent  End of Session    Activity Tolerance: Patient tolerated treatment well Patient left: in bed;with call bell/phone within reach;with family/visitor present   Time: 0837-0900 OT Time Calculation (min): 23 min Charges:  OT General Charges $OT Visit: 1 Procedure OT Evaluation $Initial OT Evaluation Tier I: 1 Procedure G-Codes: OT G-codes **NOT FOR INPATIENT CLASS** Functional Assessment Tool Used: clinical judgement Functional Limitation: Self care Self Care Current Status (C0034): At least 1 percent but less than 20 percent impaired, limited or restricted Self Care Goal Status (J1791): At least 1 percent but less than 20 percent impaired,  limited or restricted Self Care Discharge Status [phone]): At least 1 percent but less than 20 percent impaired, limited or restricted  Ailene Ravel, OTR/L,CBIS  [phone]  07/29/2014, 9:08 AM

## 2014-07-29 NOTE — Discharge Summary (Signed)
Physician Discharge Summary  [name] WIO:[id number] DOB: 05-18-1934 DOA: 07/28/2014  PCP: Delphina Cahill, MD  Admit date: 07/28/2014 Discharge date: 07/29/2014  Time spent: 40 minutes  Recommendations for Outpatient Follow-up:  1. Follow up with PCP Dr Nevada Crane 1 week.  2. PT recommending OP PT  Discharge Diagnoses:  Principal Problem:   TIA (transient ischemic attack) Active Problems:   HYPERTENSION, BENIGN   CAD, NATIVE VESSEL   CHRONIC KIDNEY DISEASE STAGE III (MODERATE)   Discharge Condition: stable  Diet recommendation: heart healthy  Filed Weights   07/28/14 2207 07/29/14 0349  Weight: 68.947 kg (152 lb) 70.4 kg (155 lb 3.3 oz)    History of present illness:  This is a 81 y.o. year old female with significant past medical history of non small cell lung cancer in remission, CAD, ischemic cardiomyopathy, HTN presented to ED on 07/29/14 with TIA. Pt stated that she went to get groceries earlier in the afternoon. Noticed L sided weakness coming home, putting away groceries. Sxs persisted for several minutes. Pt contacted daughter and subsequently called EMS. Was directed to ER for further evaluation.  Pt asymptomatic by arrival per pt. Hemodynamically stable. Afebrile. CBC and BMET WNL apart from Cr 2.2. Head CT WNL.  Pt refusing MRI  Hospital Course:  1- TIA. Refused MRI. No events on tele.  Carotid doppler Mild atherosclerotic changes bilaterally. No focal hemodynamically significant stenosis is noted. Echo results pending. Lipid panel cholesterol 248, triglycerides 179, HDL 32 and LDL 180. No statin due to allergy. Continue  plavix as she is allergic to asa. HgA1c pending. Evaluated by PT who recommended OP PT. Symptoms resolved. Follow up with PCP 1-2 weeks   2- CAD-no active CP Trop negative and EKG WNL. On plavix as she is allergic to asa according to dr coopers note. Chart review indicates she is due to see cardiology this month  3-Ischemic Cardiomyopathy remained  clinically euvolemic. Lasix held to allow permissive HTN. Echo results pending  4-non small cell lung cancer In remission.  Procedures:  Echo  Carotid Mild atherosclerotic changes bilaterally. No focal hemodynamically significant stenosis is noted.  Consultations:  none  Discharge Exam: Filed Vitals:   07/29/14 1352  BP: 150/60  Pulse: 56  Temp: 98.3 F (36.8 C)  Resp: 18    General: well nourished NAD Cardiovascular: RRR No MGR No LE edema Respiratory: normal effort BS clear Neuro: cranial nerve Ii-XII intact. Sensation of LE and UE intact. Speech clear  Discharge Instructions    Current Discharge Medication List    CONTINUE these medications which have NOT CHANGED   Details  cloNIDine (CATAPRES) 0.2 MG tablet Take 1 tablet (0.2 mg total) by mouth 2 (two) times daily. Qty: 60 tablet, Refills: 11   Associated Diagnoses: Essential hypertension, benign; Essential hypertension, malignant    clopidogrel (PLAVIX) 75 MG tablet TAKE ONE TABLET DAILY. Qty: 90 tablet, Refills: 3    furosemide (LASIX) 40 MG tablet Take 1 tablet (40 mg total) by mouth daily. Qty: 90 tablet, Refills: 3   Associated Diagnoses: Essential hypertension, benign; Chronic kidney disease, stage III (moderate)    ibuprofen (ADVIL,MOTRIN) 200 MG tablet Take 400 mg by mouth daily as needed for headache, mild pain or moderate pain.    metoprolol succinate (TOPROL-XL) 25 MG 24 hr tablet Take 0.5 tablets (12.5 mg total) by mouth at bedtime. Qty: 15 tablet, Refills: 6   Associated Diagnoses: Essential hypertension, benign    pantoprazole (PROTONIX) 40 MG tablet TAKE ONE TABLET BY  MOUTH EVERY DAY Qty: 90 tablet, Refills: 3       Allergies  Allergen Reactions  . Amlodipine Swelling  . Aspirin Swelling    Mouth swelling, tongue  . Cephalexin Hives  . Ciprofloxacin Hives and Other (See Comments)    REACTION: weakness  . Hydralazine Nausea And Vomiting  . Iron   . Penicillins Other (See  Comments)    Reaction is unknown  . Statins   . Sulfonamide Derivatives Hives   Follow-up Information    Follow up with Delphina Cahill, MD. Schedule an appointment as soon as possible for a visit in 1 week.   Specialty:  Internal Medicine   Why:  follow up to hospitalization   Contact information:    [place] [address] [phone]        The results of significant diagnostics from this hospitalization (including imaging, microbiology, ancillary and laboratory) are listed below for reference.    Significant Diagnostic Studies: Dg Chest 2 View  07/29/2014   CLINICAL DATA:  Left-sided weakness and left lower extremity numbness today.  EXAM: CHEST  2 VIEW  COMPARISON:  CT chest 06/27/2014  FINDINGS: Normal heart size and pulmonary vascularity. Prominent emphysematous changes in the lungs. Scattered interstitial fibrosis. Scarring in the right lung base. Focal nodule in the left mid lung measures 9 mm diameter. This corresponds to unknown nodule followed on CT. No focal airspace disease or consolidation in the lungs. No blunting of costophrenic angles. No pneumothorax. Calcified and tortuous aorta. Mild degenerative changes in the spine.  IMPRESSION: Emphysematous changes in the lungs. 9 mm nodule in the left upper lung corresponding to lesion followed on CT. No evidence of active pulmonary disease.   Electronically Signed   By: Lucienne Capers M.D.   On: 07/29/2014 02:54   Ct Head (brain) Wo Contrast  07/28/2014   CLINICAL DATA:  Left leg weakness today.  EXAM: CT HEAD WITHOUT CONTRAST  TECHNIQUE: Contiguous axial images were obtained from the base of the skull through the vertex without intravenous contrast.  COMPARISON:  MRI brain 09/18/2005  FINDINGS: Diffuse cerebral atrophy. Low-attenuation changes in the deep white matter consistent with small vessel ischemia. No mass effect or midline shift. No abnormal extra-axial fluid collections. Gray-white matter junctions are  distinct. Basal cisterns are not effaced. No evidence of acute intracranial hemorrhage. No depressed skull fractures. Visualized paranasal sinuses and mastoid air cells are not opacified.  IMPRESSION: No acute intracranial abnormalities. Chronic atrophy and small vessel ischemic changes.   Electronically Signed   By: Lucienne Capers M.D.   On: 07/28/2014 23:41   US Carotid Bilateral  07/29/2014   CLINICAL DATA:  TIA symptoms and left-sided weakness for 2 days  EXAM: BILATERAL CAROTID DUPLEX ULTRASOUND  TECHNIQUE: Pearline Cables scale imaging, color Doppler and duplex ultrasound were performed of bilateral carotid and vertebral arteries in the neck.  COMPARISON:  None.  FINDINGS: Criteria: Quantification of carotid stenosis is based on velocity parameters that correlate the residual internal carotid diameter with NASCET-based stenosis levels, using the diameter of the distal internal carotid lumen as the denominator for stenosis measurement.  The following velocity measurements were obtained:  RIGHT  ICA:  53/9 cm/sec  CCA:  18/8 cm/sec  SYSTOLIC ICA/CCA RATIO:  4.16  DIASTOLIC ICA/CCA RATIO:  1.6  ECA:  118 cm/sec  LEFT  ICA:  75/20 cm/sec  CCA:  60/6 cm/sec  SYSTOLIC ICA/CCA RATIO:  1.5  DIASTOLIC ICA/CCA RATIO:  2.7  ECA:  17 cm/sec  RIGHT CAROTID ARTERY: The grayscale images show mild intimal thickening throughout the common and internal carotid artery. Soft atherosclerotic plaque is noted in the proximal internal carotid artery. The waveforms, velocities and flow velocity ratios however demonstrate no evidence of focal hemodynamically significant stenosis.  RIGHT VERTEBRAL ARTERY:  Antegrade in nature.  LEFT CAROTID ARTERY: The grayscale images demonstrate changes similar to that seen on the right. Mild calcified plaque is noted in the proximal internal carotid. The waveforms, velocities and flow velocity ratios demonstrate no evidence of focal hemodynamically significant stenosis.  LEFT VERTEBRAL ARTERY:  Antegrade  in nature.  IMPRESSION: Mild atherosclerotic changes bilaterally. No focal hemodynamically significant stenosis is noted.   Electronically Signed   By: Inez Catalina M.D.   On: 07/29/2014 14:34    Microbiology: Recent Results (from the past 240 hour(s))  MRSA PCR Screening     Status: None   Collection Time: 07/29/14  4:59 AM  Result Value Ref Range Status   MRSA by PCR NEGATIVE NEGATIVE Final    Comment:        The GeneXpert MRSA Assay (FDA approved for NASAL specimens only), is one component of a comprehensive MRSA colonization surveillance program. It is not intended to diagnose MRSA infection nor to guide or monitor treatment for MRSA infections.      Labs: Basic Metabolic Panel:  Recent Labs Lab 07/28/14 2236  NA 135  K 3.5  CL 101  CO2 27  GLUCOSE 115*  BUN 35*  CREATININE 2.20*  CALCIUM 8.5   Liver Function Tests: No results for input(s): AST, ALT, ALKPHOS, BILITOT, PROT, ALBUMIN in the last 168 hours. No results for input(s): LIPASE, AMYLASE in the last 168 hours. No results for input(s): AMMONIA in the last 168 hours. CBC:  Recent Labs Lab 07/28/14 2236  WBC 7.4  NEUTROABS 5.8  HGB 12.0  HCT 37.4  MCV 82.4  PLT 249   Cardiac Enzymes:  Recent Labs Lab 07/28/14 2236  TROPONINI 0.03   BNP: BNP (last 3 results) No results for input(s): BNP in the last 8760 hours.  ProBNP (last 3 results) No results for input(s): PROBNP in the last 8760 hours.  CBG: No results for input(s): GLUCAP in the last 168 hours.     SignedRadene Gunning  [facility] 07/29/2014, 3:29 PM

## 2014-07-29 NOTE — Care Management Note (Signed)
    Page 1 of 1   07/29/2014     12:13:22 PM CARE MANAGEMENT NOTE 07/29/2014  Patient:  [name], [name]   Account Number:  [id number]  Date Initiated:  07/29/2014  Documentation initiated by:  Theophilus Kinds  Subjective/Objective Assessment:   Pt admitted from home with weakness, ? CVA. Pt lives alone and will return home at discharge. Pt is indepedent with ADL's. Pt c/o balance issues and has a cane and walker for home use.     Action/Plan:   PT recommends outpt PT and pt is agreeable. Order form completed and faxed to [facility] outpt PT dept and they will call pt with appt times. Anticipate discharge home today.   Anticipated DC Date:  07/29/2014   Anticipated DC Plan:  [place]  CM consult      Choice offered to / List presented to:             Status of service:  Completed, signed off Medicare Important Message given?   (If response is "NO", the following Medicare IM given date fields will be blank) Date Medicare IM given:   Medicare IM given by:   Date Additional Medicare IM given:   Additional Medicare IM given by:    Discharge Disposition:  HOME/SELF CARE  Per UR Regulation:    If discussed at Long Length of Stay Meetings, dates discussed:    Comments:  07/29/14 [place], RN BSN CM

## 2014-07-29 NOTE — Evaluation (Signed)
Physical Therapy Evaluation Patient Details Name: [name] MRN: [id number] DOB: [birth year] Today's Date: 07/29/2014   History of Present Illness  History of Present Illness:This is a 81 y.o. year old female with significant past medical history of non small cell lung cancer in remission, CAD, ischemic cardiomyopathy, HTN presenting with TIA. Pt states that she went to get groceries earlier in the afternoon. Noticed L sided weakness coming home, putting away groceries. Sxs persisted for several minutes. Pt contacted daughter and subsequently called EMS. Was directed to ER for further evaluation.   Clinical Impression   Pt was seen for a PT evaluation.  She was alert and very pleasant/cooperative.  She reports having no residual deficit from recent events today but continues to have unsteady gait at home of 6 months duration.  She is normally very active and independent at home and had choosen not to use any assistive device to improve gait pattern.  Her LE strength and coordination are WNL and pt has not had any visual changes.  Her static and dynamic standing balance are decreased which causes her gait instability.  She was instructed in gait with a rolling and standard walker and is independent with gait using these devices. (She has a standard walker at home and has no preference for type of walker used).  Of note, pt has a hx of stage IV breast CA and also non small cell lung CA.  She has chosen not to have a brain MRI at this time.  I am recommending OP PT to attempt to improve her standing balance and pt is agreeable to this.    Follow Up Recommendations Outpatient PT    Equipment Recommendations  None recommended by PT           Precautions / Restrictions Precautions Precautions: Fall Restrictions Weight Bearing Restrictions: No      Mobility  Bed Mobility Overal bed mobility: Independent                  Equipment used: None             General transfer  comment: pt needs one arm support to maintain stance initially on standing with wide base of support  Ambulation/Gait Ambulation/Gait assistance: Modified independent (Device/Increase time) Ambulation Distance (Feet): 100 Feet Assistive device: Rolling walker (2 wheeled);Standard walker Gait Pattern/deviations: WFL(Within Functional Limits)   Gait velocity interpretation: at or above normal speed for age/gender General Gait Details: gait with a walker fully stabilizes gait                     Balance Overall balance assessment: Needs assistance Sitting-balance support: No upper extremity supported;Feet supported Sitting balance-Leahy Scale: Normal     Standing balance support: No upper extremity supported Standing balance-Leahy Scale: Fair Standing balance comment: pt is able to stand with a narrow base of support and rotate head from side to side but only for a short time before she loses balance                             Pertinent Vitals/Pain Pain Assessment: No/denies pain    Home Living Family/patient expects to be discharged to:: Private residence Living Arrangements: Alone   Type of Home: House Home Access: Stairs to enter Entrance Stairs-Rails: Psychiatric nurse of Steps: 4 in the back Home Layout: One level Home Equipment: Cane - single point;Walker - standard  Prior Function Level of Independence: Independent               Hand Dominance   Dominant Hand: Right    Extremity/Trunk Assessment   Upper Extremity Assessment: Defer to OT evaluation           Lower Extremity Assessment: Overall WFL for tasks assessed         Communication   Communication: No difficulties  Cognition Arousal/Alertness: Awake/alert Behavior During Therapy: WFL for tasks assessed/performed Overall Cognitive Status: Within Functional Limits for tasks assessed                                     Assessment/Plan    PT Assessment All further PT needs can be met in the next venue of care  PT Diagnosis     PT Problem List Decreased balance  PT Treatment Interventions     PT Goals (Current goals can be found in the Care Plan section) Acute Rehab PT Goals PT Goal Formulation: All assessment and education complete, DC therapy         Barriers to discharge    none                   End of Session Equipment Utilized During Treatment: Gait belt Activity Tolerance: Patient tolerated treatment well Patient left: in bed;with call bell/phone within reach;with family/visitor present      Functional Assessment Tool Used: clinical observation Functional Limitation: Mobility: Walking and moving around Mobility: Walking and Moving Around Current Status (O0600): At least 1 percent but less than 20 percent impaired, limited or restricted Mobility: Walking and Moving Around Goal Status [phone]): At least 1 percent but less than 20 percent impaired, limited or restricted Mobility: Walking and Moving Around Discharge Status [phone]): At least 1 percent but less than 20 percent impaired, limited or restricted    Time: 3953-2023 PT Time Calculation (min) (ACUTE ONLY): 40 min   Charges:   PT Evaluation $Initial PT Evaluation Tier I: 1 Procedure     PT G Codes:   PT G-Codes **NOT FOR INPATIENT CLASS** Functional Assessment Tool Used: clinical observation Functional Limitation: Mobility: Walking and moving around Mobility: Walking and Moving Around Current Status (X4356): At least 1 percent but less than 20 percent impaired, limited or restricted Mobility: Walking and Moving Around Goal Status [phone]): At least 1 percent but less than 20 percent impaired, limited or restricted Mobility: Walking and Moving Around Discharge Status [phone]): At least 1 percent but less than 20 percent impaired, limited or restricted    Sable Feil 07/29/2014, 11:57 AM

## 2014-07-29 NOTE — Progress Notes (Signed)
*  PRELIMINARY RESULTS* Echocardiogram 2D Echocardiogram has been performed.  Leavy Cella 07/29/2014, 2:44 PM

## 2014-07-29 NOTE — H&P (Signed)
Hospitalist Admission History and Physical  Patient name: [name] Medical record number: [id number] Date of birth: 1933-11-20 Age: 81 y.o. Gender: female  Primary Care Provider: Delphina Cahill, MD  Chief Complaint: TIA  History of Present Illness:This is a 81 y.o. year old female with significant past medical history of non small cell lung cancer in remission, CAD, ischemic cardiomyopathy, HTN  presenting with TIA. Pt states that she went to get groceries earlier in the afternoon. Noticed L sided weakness coming home, putting away groceries. Sxs persisted for several minutes. Pt contacted daughter and subsequently called EMS. Was directed to ER for further evaluation.  Pt asymptomatic by arrival per pt. Hemodynamically stable. Afebrile. CBC and BMET WNL apart from Cr 2.2. Head CT WNL. Teleneuro recommending admission. Pt refusing MRI.   Assessment and Plan: [name] is a 81 y.o. year old female presenting with TIA   Active Problems:   TIA (transient ischemic attack)   1- TIA -Proceed down stroke pathway w/ carotid dopplers, 2D ECHO, risk stratification labs -pt currently refusing MRI (willing to consider if her cardiologist-Cooper, says it is ok) -neuro consult in am  -cont plavix -tele bed   2- CAD -no active CP  -trop/EKG WNL  -tele bed -cont home regimen   3-Ischemic Cardiomyopathy  -clinically euvolemic -2D ECHO per protocol  -tele bed  4-non small cell lung cancer  -in remission  -follow  FEN/GI: heart healthy diet  Prophylaxis: sub q heparin  Disposition: pending further evaluation. Pt very hesitant about admission but is agreeable to "short hospitalization/observation" Code Status:Full Code    Patient Active Problem List   Diagnosis Date Noted  . TIA (transient ischemic attack) 07/29/2014  . Dyslipidemia 09/28/2010  . Lung cancer 09/28/2010  . Edema 09/28/2010  . DYSPNEA 08/01/2010  . CHRONIC KIDNEY DISEASE STAGE III (MODERATE) 11/27/2009  .  ACUTE CYSTITIS 11/27/2009  . ACUTE GOUTY ARTHROPATHY 05/02/2009  . HYPERTENSION, BENIGN 11/09/2008  . CAD, NATIVE VESSEL 11/09/2008  . DEPENDENT EDEMA 11/09/2008   Past Medical History: Past Medical History  Diagnosis Date  . Hypertension   . Hypercholesterolemia   . CKD (chronic kidney disease)   . Coronary artery disease     a.  LHC (06/04/05): LHC done in [place] with high grade RCA => s/p BMS to RCA;  b.  Nuclear (09/14/09): Lexiscan; Inf infarct with mild peri-infarct ishemia, EF 52%; Low Risk.  Marland Kitchen GERD (gastroesophageal reflux disease)   . Chronic anemia   . Ischemic cardiomyopathy     a. Echo (07/26/13): Mild LVH, EF 35-40%, diff HK, inf AK, Gr 2 DD, Tr AI, mildly dilated Ao root, MAC, mild MR, mild LAE, mod reduced RVSF.  . Non-small cell carcinoma of lung     Stage IV    Past Surgical History: Past Surgical History  Procedure Laterality Date  . Hematoma evacuation  December 2006    groin  . Appendectomy    . Cataract extraction    . Hemorroidectomy    . Laminectomy      Social History: History   Social History  . Marital Status: Widowed    Spouse Name: N/A  . Number of Children: N/A  . Years of Education: N/A   Social History Main Topics  . Smoking status: Former Smoker    Types: Cigarettes  . Smokeless tobacco: Not on file  . Alcohol Use: No  . Drug Use: Not on file  . Sexual Activity: Not on file   Other Topics Concern  .  None   Social History Narrative    Family History: Family History  Problem Relation Age of Onset  . Heart failure Mother   . Lung cancer Sister   . Lung cancer Brother   . Stomach cancer Brother   . Heart attack Neg Hx   . Stroke Neg Hx     Allergies: Allergies  Allergen Reactions  . Amlodipine Swelling  . Aspirin Swelling    Mouth swelling, tongue  . Cephalexin Hives  . Ciprofloxacin Hives and Other (See Comments)    REACTION: weakness  . Hydralazine Nausea And Vomiting  . Iron   . Penicillins Other (See  Comments)    Reaction is unknown  . Statins   . Sulfonamide Derivatives Hives    Current Facility-Administered Medications  Medication Dose Route Frequency Provider Last Rate Last Dose  .  stroke: mapping our early stages of recovery book   Does not apply Once Shanda Howells, MD      . heparin injection 5,000 Units  5,000 Units Subcutaneous 3 times per day Shanda Howells, MD       Current Outpatient Prescriptions  Medication Sig Dispense Refill  . cloNIDine (CATAPRES) 0.2 MG tablet Take 1 tablet (0.2 mg total) by mouth 2 (two) times daily. 60 tablet 11  . clopidogrel (PLAVIX) 75 MG tablet TAKE ONE TABLET DAILY. 90 tablet 3  . furosemide (LASIX) 40 MG tablet Take 1 tablet (40 mg total) by mouth daily. 90 tablet 3  . ibuprofen (ADVIL,MOTRIN) 200 MG tablet Take 400 mg by mouth daily as needed for headache, mild pain or moderate pain.    . metoprolol succinate (TOPROL-XL) 25 MG 24 hr tablet Take 0.5 tablets (12.5 mg total) by mouth at bedtime. 15 tablet 6  . pantoprazole (PROTONIX) 40 MG tablet TAKE ONE TABLET BY MOUTH EVERY DAY 90 tablet 3   Review Of Systems: 12 point ROS negative except as noted above in HPI.  Physical Exam: Filed Vitals:   07/29/14 0100  BP: 165/70  Pulse: 53  Temp:   Resp: 16    General: alert and cooperative HEENT: PERRLA and extra ocular movement intact Heart: S1, S2 normal, no murmur, rub or gallop, regular rate and rhythm Lungs: clear to auscultation, no wheezes or rales and unlabored breathing Abdomen: abdomen is soft without significant tenderness, masses, organomegaly or guarding Extremities: extremities normal, atraumatic, no cyanosis or edema Skin:no rashes, no ecchymoses Neurology: normal without focal findings  Labs and Imaging: Lab Results  Component Value Date/Time   NA 135 07/28/2014 10:36 PM   NA 139 06/27/2014 02:00 PM   NA 143 12/23/2011 08:30 AM   K 3.5 07/28/2014 10:36 PM   K 3.8 06/27/2014 02:00 PM   K 4.7 12/23/2011 08:30 AM   CL  101 07/28/2014 10:36 PM   CL 104 06/19/2012 09:58 AM   CL 101 12/23/2011 08:30 AM   CO2 27 07/28/2014 10:36 PM   CO2 28 06/27/2014 02:00 PM   CO2 27 12/23/2011 08:30 AM   BUN 35* 07/28/2014 10:36 PM   BUN 25.8 06/27/2014 02:00 PM   BUN 25* 12/23/2011 08:30 AM   CREATININE 2.20* 07/28/2014 10:36 PM   CREATININE 2.3* 06/27/2014 02:00 PM   CREATININE 2.09* 02/01/2014 03:45 PM   GLUCOSE 115* 07/28/2014 10:36 PM   GLUCOSE 105 06/27/2014 02:00 PM   GLUCOSE 95 06/19/2012 09:58 AM   GLUCOSE 108 12/23/2011 08:30 AM   Lab Results  Component Value Date   WBC 7.4 07/28/2014   HGB  12.0 07/28/2014   HCT 37.4 07/28/2014   MCV 82.4 07/28/2014   PLT 249 07/28/2014    Ct Head (brain) Wo Contrast  07/28/2014   CLINICAL DATA:  Left leg weakness today.  EXAM: CT HEAD WITHOUT CONTRAST  TECHNIQUE: Contiguous axial images were obtained from the base of the skull through the vertex without intravenous contrast.  COMPARISON:  MRI brain 09/18/2005  FINDINGS: Diffuse cerebral atrophy. Low-attenuation changes in the deep white matter consistent with small vessel ischemia. No mass effect or midline shift. No abnormal extra-axial fluid collections. Gray-white matter junctions are distinct. Basal cisterns are not effaced. No evidence of acute intracranial hemorrhage. No depressed skull fractures. Visualized paranasal sinuses and mastoid air cells are not opacified.  IMPRESSION: No acute intracranial abnormalities. Chronic atrophy and small vessel ischemic changes.   Electronically Signed   By: Lucienne Capers M.D.   On: 07/28/2014 23:41           Shanda Howells MD  Pager: [phone]

## 2014-07-30 LAB — HEMOGLOBIN A1C
Hgb A1c MFr Bld: 6.1 % — ABNORMAL HIGH (ref 4.8–5.6)
Mean Plasma Glucose: 128 mg/dL

## 2014-08-02 LAB — URINE CULTURE: Colony Count: 40000

## 2014-08-08 ENCOUNTER — Telehealth: Payer: Self-pay | Admitting: Physician Assistant

## 2014-08-08 ENCOUNTER — Ambulatory Visit (HOSPITAL_COMMUNITY): Payer: Medicare Other | Attending: Internal Medicine | Admitting: Physical Therapy

## 2014-08-08 ENCOUNTER — Encounter (HOSPITAL_COMMUNITY): Payer: Self-pay | Admitting: Physical Therapy

## 2014-08-08 ENCOUNTER — Telehealth: Payer: Self-pay | Admitting: Cardiovascular Disease

## 2014-08-08 DIAGNOSIS — Z7409 Other reduced mobility: Secondary | ICD-10-CM

## 2014-08-08 DIAGNOSIS — R6889 Other general symptoms and signs: Secondary | ICD-10-CM

## 2014-08-08 DIAGNOSIS — R269 Unspecified abnormalities of gait and mobility: Secondary | ICD-10-CM | POA: Insufficient documentation

## 2014-08-08 DIAGNOSIS — M6281 Muscle weakness (generalized): Secondary | ICD-10-CM | POA: Diagnosis not present

## 2014-08-08 DIAGNOSIS — R531 Weakness: Secondary | ICD-10-CM

## 2014-08-08 DIAGNOSIS — R293 Abnormal posture: Secondary | ICD-10-CM | POA: Diagnosis not present

## 2014-08-08 NOTE — Therapy (Signed)
[place], [place], [address] Phone: [phone]   Fax:  [phone]  Physical Therapy Evaluation  Patient Details  Name: [name] MRN: [id number] Date of Birth: 10-Oct-1933 Referring Provider:  Kathie Dike, MD  Encounter Date: 08/08/2014      PT End of Session - 08/08/14 1206    Visit Number 1   Number of Visits 8   Date for PT Re-Evaluation 09/05/14   Authorization Type [employer]   Authorization Time Period 08/08/14 to 10/08/14   Authorization - Visit Number 1   Authorization - Number of Visits 10   Equipment Utilized During Treatment Gait belt   Activity Tolerance Patient tolerated treatment well   Behavior During Therapy [facility] for tasks assessed/performed      Past Medical History  Diagnosis Date  . Hypertension   . Hypercholesterolemia   . CKD (chronic kidney disease)   . Coronary artery disease     a.  LHC (06/04/05): LHC done in [place] with high grade RCA => s/p BMS to RCA;  b.  Nuclear (09/14/09): Lexiscan; Inf infarct with mild peri-infarct ishemia, EF 52%; Low Risk.  Marland Kitchen GERD (gastroesophageal reflux disease)   . Chronic anemia   . Ischemic cardiomyopathy     a. Echo (07/26/13): Mild LVH, EF 35-40%, diff HK, inf AK, Gr 2 DD, Tr AI, mildly dilated Ao root, MAC, mild MR, mild LAE, mod reduced RVSF.  . Non-small cell carcinoma of lung     Stage IV    Past Surgical History  Procedure Laterality Date  . Hematoma evacuation  December 2006    groin  . Appendectomy    . Cataract extraction    . Hemorroidectomy    . Laminectomy      There were no vitals taken for this visit.  Visit Diagnosis:  Impaired functional mobility, balance, gait, and endurance - Plan: PT plan of care cert/re-cert  Weakness generalized - Plan: PT plan of care cert/re-cert  Postural imbalance - Plan: PT plan of care cert/re-cert  Decreased activity tolerance - Plan: PT plan of care  cert/re-cert      Subjective Assessment - 08/08/14 1107    Symptoms Patient states that she just feels unsteady, can't walk straight   Pertinent History Per pt, patient does not have vertigo; in 2007 had aneurysm at [facility] main hospital. Had allergic reaction to dye from catherization exam, ever since has had issues with her feet and feels imbalanced. Has had a fall a couple of years ago tripping on her dog with multiple fractures. Patient does state that she has fear of falling since this fall.    How long can you sit comfortably? no problems   How long can you stand comfortably? no problems   How long can you walk comfortably? generally household distances   Patient Stated Goals to walk better, reduce fear of falling   Currently in Pain? No/denies          [facility] PT Assessment - 08/08/14 0001    Assessment   Medical Diagnosis weakness, vertigo   Balance Screen   Has the patient fallen in the past 6 months No   Has the patient had a decrease in activity level because of a fear of falling?  Yes   Is the patient reluctant to leave their home because of a fear of falling?  No   Observation/Other Assessments   Focus on Therapeutic Outcomes (FOTO)  49% limited  AROM   Right Hip External Rotation  40   Right Hip Internal Rotation  28   Left Hip External Rotation  35   Left Hip Internal Rotation  31   Right Ankle Dorsiflexion 15   Left Ankle Dorsiflexion 10   Strength   Right Hip Flexion 3-/5   Right Hip Extension 2/5   Right Hip ABduction 3+/5   Left Hip Flexion 3-/5   Left Hip Extension 2/5   Left Hip ABduction 4-/5   Right Knee Flexion 4-/5   Right Knee Extension 4/5   Left Knee Flexion 4/5   Left Knee Extension 4-/5   Right Ankle Dorsiflexion 4-/5   Left Ankle Dorsiflexion 4-/5   Flexibility   Quadriceps R side 35cm from IT before Ely, L side 39 cm from IT before [employer] Test   Sit to Stand Able to stand without using hands and stabilize independently    Standing Unsupported Able to stand safely 2 minutes   Sitting with Back Unsupported but Feet Supported on Floor or Stool Able to sit safely and securely 2 minutes   Stand to Sit Sits safely with minimal use of hands   Transfers Able to transfer safely, minor use of hands   Standing Unsupported with Eyes Closed Able to stand 10 seconds with supervision   Standing Ubsupported with Feet Together Able to place feet together independently and stand for 1 minute with supervision   From Standing, Reach Forward with Outstretched Arm Can reach forward >12 cm safely (5")   From Standing Position, Pick up Object from Floor Able to pick up shoe, needs supervision   From Standing Position, Turn to Look Behind Over each Shoulder Looks behind from both sides and weight shifts well   Turn 360 Degrees Able to turn 360 degrees safely one side only in 4 seconds or less   Standing Unsupported, Alternately Place Feet on Step/Stool Able to stand independently and complete 8 steps >20 seconds   Standing Unsupported, One Foot in [employer] balance while stepping or standing   Standing on One Leg Tries to lift leg/unable to hold 3 seconds but remains standing independently   Total Score 43                  [facility] Adult PT Treatment/Exercise - 08/08/14 0001    Knee/Hip Exercises: Stretches   Hip Flexor Stretch 1 rep;30 seconds   Hip Flexor Stretch Limitations supine   Gastroc Stretch 3 reps;30 seconds   Gastroc Stretch Limitations slantboard   Knee/Hip Exercises: Standing   Other Standing Knee Exercises Prone hip extensions 1x10   Knee/Hip Exercises: Supine   Bridges Both;1 set;10 reps                PT Education - 08/08/14 1153    Education provided Yes   Education Details Prognosis, overall PT plan of care, HEP   Person(s) Educated Patient   Methods Explanation   Comprehension Verbalized understanding;Returned demonstration          PT Short Term Goals - 08/08/14 1207    PT SHORT  TERM GOAL #1   Title Patient will demonstrate an increase in Berg balance score to at least 48 in order to reduce overall fall risk    Time 3   Period Weeks   Status New   PT SHORT TERM GOAL #2   Title Patient will demonstrate an improvement of at least one muscle grade in all tested groups  in order to improve stability and reduce fall risk during dyanmic activities    Time 3   Period Weeks   Status New   PT SHORT TERM GOAL #3   Title Patient will demonstrate an improvement in bilateral quad flexibility to 30cm away from ischial tuberosity before positive Ely's; improvement in bilateral ankle flexibility to 18 degrees dorsiflexion    Time 3   Period Weeks   Status New           PT Long Term Goals - 01-Sep-2014 1207    PT LONG TERM GOAL #1   Title Patient will demonstrate improved gait mechanics including improved pelvic rotation, improved step length, improved gait speed, and improved spinal mobility during gait in order to enhance mechanics and reduce overall fall risk   Time 6   Period Weeks   Status New   PT LONG TERM GOAL #2   Title Patient will be I in advanced HEP program   Time 6   Period Weeks   Status New   PT LONG TERM GOAL #3   Title Patient will be able to voice the importance of good postural awareness and demonstrate upright posture without cues on 5/5 attempts and throughout daily activities    Time 6   Period Weeks   Status New   PT LONG TERM GOAL #4   Title Patient will demonstrate the abiility to ambulate over uneven surfaces with single point cane and without distruption of balance due to improved dynamic balance reaction skills   Time 6   Period Weeks   Status New               Plan - 2014-09-01 04-Sep-1204    Clinical Impression Statement Patient presents with generalized weakness, widespread muscle tightness most markedly in hip flexors/quadriceps/gastrocs, impaired gait pattern, reduced functional balance and balance recovery strategies, impaired  posture, history of falls with increased fear of falling, and reduced functional activity tolerance. Patient willing to participate in skilled sessions however cannot afford more than 1 session every two weeks. Patient will benefit from skilled PT srvices in order to improve strength and overall muscle tightness, improve gait mechancis, improve balance reaction skills, improve functional activity tolerance, and reduce overall fall risk.   Pt will benefit from skilled therapeutic intervention in order to improve on the following deficits Abnormal gait;Decreased endurance;Impaired perceived functional ability;Improper body mechanics;Decreased activity tolerance;Decreased strength;Impaired flexibility;Postural dysfunction;Decreased balance;Decreased mobility;Difficulty walking;Decreased range of motion;Decreased coordination;Decreased safety awareness   Rehab Potential Good   PT Frequency 1x / week  1x/week every 2 weeks    PT Duration Other (comment)  1x/week every 2 weeks   PT Treatment/Interventions ADLs/Self Care Home Management;Gait training;Neuromuscular re-education;Stair training;Functional mobility training;Patient/family education;Therapeutic activities;Therapeutic exercise;Manual techniques;Energy conservation;DME Instruction;Balance training   PT Next Visit Plan Adjust HEP as appropriate; schedule next session! Functional stretches, strengthening; balance tasks   PT Home Exercise Plan given   Consulted and Agree with Plan of Care Patient          G-Codes - 09/01/14 09/04/05    Functional Assessment Tool Used FOTO   Functional Limitation Mobility: Walking and moving around   Mobility: Walking and Moving Around Current Status [phone]) At least 40 percent but less than 60 percent impaired, limited or restricted   Mobility: Walking and Moving Around Goal Status [phone]) At least 20 percent but less than 40 percent impaired, limited or restricted       Problem List Patient Active Problem List    Diagnosis  Date Noted  . TIA (transient ischemic attack) 07/29/2014  . Urinary tract infectious disease   . Dyslipidemia 09/28/2010  . Lung cancer 09/28/2010  . Edema 09/28/2010  . DYSPNEA 08/01/2010  . CHRONIC KIDNEY DISEASE STAGE III (MODERATE) 11/27/2009  . ACUTE CYSTITIS 11/27/2009  . ACUTE GOUTY ARTHROPATHY 05/02/2009  . HYPERTENSION, BENIGN 11/09/2008  . CAD, NATIVE VESSEL 11/09/2008  . DEPENDENT EDEMA 11/09/2008    Deniece Ree PT, DPT [place] [address] [place], [place], [address] Phone: [phone]   Fax:  [phone]

## 2014-08-08 NOTE — Telephone Encounter (Signed)
Follow Up         Pt calling back to speak to Arbie Cookey in regards to what she called about earlier. Please call back and advise.

## 2014-08-08 NOTE — Patient Instructions (Signed)
Bridge   Lie back, legs bent. Inhale, pressing hips up. Keeping ribs in, lengthen lower back. Exhale, rolling down along spine from top. Repeat __10__ times. Do __2__ sessions per day.  Copyright  VHI. All rights reserved.  EXTENSION: Standing (Active)   Stand, both feet flat. Draw right leg behind body as far as possible.  Complete _1__ sets of _10__ repetitions. Perform __2_ sessions per day.  http://gtsc.exer.us/77   Copyright  VHI. All rights reserved.  Achilles Tendon Stretch   Stand with hands supported on wall, elbows slightly bent, feet parallel and both heels on floor, front knee bent, back knee straight. Slowly relax back knee until a stretch is felt in achilles tendon. Hold __30__ seconds. Repeat with leg positions switched. Do twice each leg, perform twice.   Copyright  VHI. All rights reserved.  Hip Flexor Stretch   Lying on back near edge of bed, bend one leg, foot flat. Hang other leg over edge, relaxed, thigh resting entirely on bed for _2___ minutes. Repeat __1__ times. Do __2__ sessions per day. Advanced Exercise: Bend knee back keeping thigh in contact with bed.  http://gt2.exer.us/347   Copyright  VHI. All rights reserved.

## 2014-08-08 NOTE — Telephone Encounter (Signed)
New Msg          Pt calling in reference to balance therapy she was referred to by Richardson Dopp.  Pt not sure if there is a fee, states she has a few questions about it because of her fixed income. . Please return pt call.

## 2014-08-08 NOTE — Telephone Encounter (Signed)
Error

## 2014-08-10 NOTE — Telephone Encounter (Signed)
I called pt back  today in regards to her call 3/7. Pt wanted the phone # agaain for the free balance screening at [facility],  gave her [phone]. Pt said thank you.

## 2014-08-17 ENCOUNTER — Ambulatory Visit (INDEPENDENT_AMBULATORY_CARE_PROVIDER_SITE_OTHER): Payer: Medicare Other | Admitting: Cardiovascular Disease

## 2014-08-17 ENCOUNTER — Encounter: Payer: Self-pay | Admitting: Cardiovascular Disease

## 2014-08-17 VITALS — BP 122/68 | HR 57 | Ht 68.0 in | Wt 148.0 lb

## 2014-08-17 DIAGNOSIS — I2510 Atherosclerotic heart disease of native coronary artery without angina pectoris: Secondary | ICD-10-CM

## 2014-08-17 NOTE — Patient Instructions (Signed)
Your physician wants you to follow-up in: 6 MONTHS with Dr Cooper.  You will receive a reminder letter in the mail two months in advance. If you don't receive a letter, please call our office to schedule the follow-up appointment.  Your physician recommends that you continue on your current medications as directed. Please refer to the Current Medication list given to you today.  

## 2014-08-17 NOTE — Progress Notes (Signed)
Cardiology Office Note   Date:  08/17/2014   ID:  [name], DOB July 13, 1933, MRN [id number]  PCP:  Delphina Cahill, MD  Cardiologist:  Sherren Mocha, MD    No chief complaint on file.    History of Present Illness: [name] is a 81 y.o. female who presents for follow-up of CAD and hypertension.  The patient has coronary artery disease and she has undergone coronary stenting in 2007 when she was treated with a bare-metal stent in the right coronary artery. She also has difficult to control hypertension and chronic kidney disease. The patient has stage IV non-small cell lung cancer and has been treated since 2007 with palliative chemotherapy. Remarkably her cancer has been stable since that time. She's been treated with Plavix for her coronary disease as she is aspirin allergic.   She was hospitalized 2/26 with weakness and gait instability. There was concern for TIA. She had a CT of the brain, carotid dopplers, and an echocardiogram. There were no acute findings noted. She has had mild LV dysfunction in the past, but echo in the hospital showed normal LV function with LVEF 55% and mild aortic stenosis. She ultimately was diagnosed with a urinary tract infection. She's feeling much better after a course of Bactrim.  The patient currently feels well. She denies chest pain, edema, orthopnea, or PND. She has mild dyspnea with exertion and reports no change in symptoms.  Past Medical History  Diagnosis Date  . Hypertension   . Hypercholesterolemia   . CKD (chronic kidney disease)   . Coronary artery disease     a.  LHC (06/04/05): LHC done in [place] with high grade RCA => s/p BMS to RCA;  b.  Nuclear (09/14/09): Lexiscan; Inf infarct with mild peri-infarct ishemia, EF 52%; Low Risk.  Marland Kitchen GERD (gastroesophageal reflux disease)   . Chronic anemia   . Ischemic cardiomyopathy     a. Echo (07/26/13): Mild LVH, EF 35-40%, diff HK, inf AK, Gr 2 DD, Tr AI, mildly dilated Ao root,  MAC, mild MR, mild LAE, mod reduced RVSF.  . Non-small cell carcinoma of lung     Stage IV    Past Surgical History  Procedure Laterality Date  . Hematoma evacuation  December 2006    groin  . Appendectomy    . Cataract extraction    . Hemorroidectomy    . Laminectomy      Current Outpatient Prescriptions  Medication Sig Dispense Refill  . sulfamethoxazole-trimethoprim (BACTRIM,SEPTRA) 400-80 MG per tablet     . cloNIDine (CATAPRES) 0.2 MG tablet Take 1 tablet (0.2 mg total) by mouth 2 (two) times daily. 60 tablet 11  . clopidogrel (PLAVIX) 75 MG tablet TAKE ONE TABLET DAILY. 90 tablet 3  . furosemide (LASIX) 40 MG tablet Take 1 tablet (40 mg total) by mouth daily. 90 tablet 3  . ibuprofen (ADVIL,MOTRIN) 200 MG tablet Take 400 mg by mouth daily as needed for headache, mild pain or moderate pain.    . metoprolol succinate (TOPROL-XL) 25 MG 24 hr tablet Take 0.5 tablets (12.5 mg total) by mouth at bedtime. 15 tablet 6  . pantoprazole (PROTONIX) 40 MG tablet TAKE ONE TABLET BY MOUTH EVERY DAY 90 tablet 3   No current facility-administered medications for this visit.    Allergies:   Amlodipine; Ciprofloxacin; Statins; Aspirin; Cephalexin; Hydralazine; Iron; Sulfonamide derivatives; and Penicillins   Social History:  The patient  reports that she has quit smoking. Her smoking use included Cigarettes.  She does not have any smokeless tobacco history on file. She reports that she does not drink alcohol.   Family History:  The patient's family history includes Heart failure in her mother; Lung cancer in her brother and sister; Stomach cancer in her brother. There is no history of Heart attack or Stroke.    ROS:  Please see the history of present illness.  Otherwise, review of systems is positive for constipation, balance problems, hip pain.  All other systems are reviewed and negative.    PHYSICAL EXAM: VS:  BP 122/68 mmHg  Pulse 57  Ht 5\' 8"  (1.727 m)  Wt 148 lb (67.132 kg)  BMI  22.51 kg/m2  SpO2 97% , BMI Body mass index is 22.51 kg/(m^2). GEN: Well nourished, well developed, in no acute distress HEENT: normal Neck: no JVD, no masses. No carotid bruits Cardiac: RRR without murmur or gallop                Respiratory:  clear to auscultation bilaterally, normal work of breathing GI: soft, nontender, nondistended, + BS MS: no deformity or atrophy Ext: no pretibial edema Skin: warm and dry, no rash Neuro:  Strength and sensation are intact Psych: euthymic mood, full affect  EKG:  EKG is not ordered today.  Recent Labs: 06/27/2014: ALT 7 07/28/2014: BUN 35*; Creatinine 2.20*; Hemoglobin 12.0; Platelets 249; Potassium 3.5; Sodium 135   Lipid Panel     Component Value Date/Time   CHOL 248* 07/29/2014 0650   TRIG 179* 07/29/2014 0650   HDL 32* 07/29/2014 0650   CHOLHDL 7.8 07/29/2014 0650   VLDL 36 07/29/2014 0650   LDLCALC 180* 07/29/2014 0650   LDLDIRECT 209.4 05/19/2008 1024      Wt Readings from Last 3 Encounters:  08/17/14 148 lb (67.132 kg)  07/29/14 155 lb 3.3 oz (70.4 kg)  07/04/14 152 lb 4.8 oz (69.083 kg)     Cardiac Studies Reviewed: 2D Echo: Study Conclusions  - Left ventricle: The cavity size was normal. Wall thickness was increased in a pattern of mild LVH. Systolic function was normal. The estimated ejection fraction was in the range of 50% to 55%. Doppler parameters are consistent with abnormal left ventricular relaxation (grade 1 diastolic dysfunction). - Aortic valve: Mildly to moderately calcified annulus. Trileaflet; moderately thickened leaflets. There was mild stenosis. There was mild to moderate regurgitation. Mean gradient (S): 8 mm Hg. Valve area (VTI): 1.7 cm^2. Valve area (Vmax): 1.66 cm^2. - Left atrium: The atrium was mildly dilated. - Technically difficult study.  ASSESSMENT AND PLAN: 1.  Hypertension, controlled: Blood pressure is now very well controlled on clonidine 0.2 mg twice daily in  combination with metoprolol succinate. No changes were recommended today.  2. CKD Stage IV: Reviewed recent lab work and renal function is stable. The patient is followed by nephrology. She is taking Tylenol as needed. She was advised to avoid all nonsteroidal anti-inflammatories.  3. Chronic systolic heart failure, NYHA functional class II: LVEF improved by recent echo. The patient appears stable at present. ACE/ARB contraindicated because of advanced renal disease. Patient is on a long-acting beta blocker.  4. CAD, native vessel: Stable without symptoms of angina. She is on clopidogrel because of an aspirin allergy. The patient is statin intolerant. She tolerates a beta blocker.   Current medicines are reviewed with the patient today.  The patient does not have concerns regarding medicines.  The following changes have been made:  no change  Labs/ tests ordered today include:  No  orders of the defined types were placed in this encounter.   Disposition:   FU 6 months  Signed, Sherren Mocha, MD  08/17/2014 9:37 AM    [facility] [place], [place], [place]  [address] Phone: [phone]; Fax: [phone]

## 2014-08-18 NOTE — Addendum Note (Signed)
Addended by: Hunt Oris on: 08/18/2014 03:10 PM   Modules accepted: Orders

## 2014-08-25 ENCOUNTER — Ambulatory Visit (HOSPITAL_COMMUNITY): Payer: Medicare Other | Admitting: Physical Therapy

## 2014-08-26 ENCOUNTER — Other Ambulatory Visit: Payer: Self-pay | Admitting: Cardiovascular Disease

## 2014-10-13 ENCOUNTER — Other Ambulatory Visit: Payer: Self-pay | Admitting: Cardiovascular Disease

## 2014-10-14 NOTE — Telephone Encounter (Signed)
Per note 3.16.16

## 2014-10-18 ENCOUNTER — Telehealth: Payer: Self-pay | Admitting: Physician Assistant

## 2014-10-18 NOTE — Telephone Encounter (Signed)
New message    Patient calling     Pt c/o medication issue:  1. Name of Medication: metoprolol 25 mg   2. How are you currently taking this medication (dosage and times per day)? 1/2 at night before bed .   3. Are you having a reaction (difficulty breathing--STAT)? Heart racing   4. What is your medication issue?  Heart rate is high  81-100 . Think beating to fast

## 2014-10-20 NOTE — Telephone Encounter (Signed)
lmptcb in regards to her question about metoprolol

## 2014-10-20 NOTE — Telephone Encounter (Signed)
S/w pt in regards to her metoprolol. Pt states she started taking her metoprolol 12.5 mg HS. Pt states her Heart Rate when she walks up stairs goes up to about 100. I advised pt to take the metoprolol in the AM since this is long acting. I explained since it is long acting that her taking at bedtime she is not getting the full benefit of the medication and that she should take it in the AM. I advised for her to try this and let me know Monday 10/24/14 to see if this helps her feel better. Pt said ok and thank you. Pt states her hip has been causing her a lot of pain so she has been taking tylenol and ibuprofen and feels this is what is making her tired and that it was not the metoprolol so she agrees to take metoprolol in the AM.

## 2014-10-25 ENCOUNTER — Ambulatory Visit (INDEPENDENT_AMBULATORY_CARE_PROVIDER_SITE_OTHER): Payer: Medicare Other

## 2014-10-25 ENCOUNTER — Ambulatory Visit (INDEPENDENT_AMBULATORY_CARE_PROVIDER_SITE_OTHER): Payer: Medicare Other | Admitting: Orthopedic Surgery

## 2014-10-25 ENCOUNTER — Encounter: Payer: Self-pay | Admitting: Orthopedic Surgery

## 2014-10-25 VITALS — BP 162/94 | Ht 68.0 in | Wt 144.0 lb

## 2014-10-25 DIAGNOSIS — M25551 Pain in right hip: Secondary | ICD-10-CM

## 2014-10-25 DIAGNOSIS — M5441 Lumbago with sciatica, right side: Secondary | ICD-10-CM

## 2014-10-25 NOTE — Patient Instructions (Signed)
Joint Injection  Care After  Refer to this sheet in the next few days. These instructions provide you with information on caring for yourself after you have had a joint injection. Your caregiver also may give you more specific instructions. Your treatment has been planned according to current medical practices, but problems sometimes occur. Call your caregiver if you have any problems or questions after your procedure.  After any type of joint injection, it is not uncommon to experience:  · Soreness, swelling, or bruising around the injection site.  · Mild numbness, tingling, or weakness around the injection site caused by the numbing medicine used before or with the injection.  It also is possible to experience the following effects associated with the specific agent after injection:  · Iodine-based contrast agents:  ¨ Allergic reaction (itching, hives, widespread redness, and swelling beyond the injection site).  · Corticosteroids (These effects are rare.):  ¨ Allergic reaction.  ¨ Increased blood sugar levels (If you have diabetes and you notice that your blood sugar levels have increased, notify your caregiver).  ¨ Increased blood pressure levels.  ¨ Mood swings.  · Hyaluronic acid in the use of viscosupplementation.  ¨ Temporary heat or redness.  ¨ Temporary rash and itching.  ¨ Increased fluid accumulation in the injected joint.  These effects all should resolve within a day after your procedure.   HOME CARE INSTRUCTIONS  · Limit yourself to light activity the day of your procedure. Avoid lifting heavy objects, bending, stooping, or twisting.  · Take prescription or over-the-counter pain medication as directed by your caregiver.  · You may apply ice to your injection site to reduce pain and swelling the day of your procedure. Ice may be applied 03-04 times:  ¨ Put ice in a plastic bag.  ¨ Place a towel between your skin and the bag.  ¨ Leave the ice on for no longer than 15-20 minutes each time.  SEEK  IMMEDIATE MEDICAL CARE IF:   · Pain and swelling get worse rather than better or extend beyond the injection site.  · Numbness does not go away.  · Blood or fluid continues to leak from the injection site.  · You have chest pain.  · You have swelling of your face or tongue.  · You have trouble breathing or you become dizzy.  · You develop a fever, chills, or severe tenderness at the injection site that last longer than 1 day.  MAKE SURE YOU:  · Understand these instructions.  · Watch your condition.  · Get help right away if you are not doing well or if you get worse.  Document Released: 01/31/2011 Document Revised: 08/12/2011 Document Reviewed: 01/31/2011  Exit[employer]® Patient Information ©2015 [employer]. This information is not intended to replace advice given to you by your health care provider. Make sure you discuss any questions you have with your health care provider.

## 2014-10-25 NOTE — Progress Notes (Signed)
Patient ID: [name], female   DOB: [birth year], 81 y.o.   MRN: [id number]  Chief Complaint  Patient presents with  . Hip Pain    right hip pain DOI 05/22/14 S/P FALL    Dr Nevada Crane referring  [facility]  Pain right hip since February 2016  [facility] December 2015 but didn't have any real hip pain at that time  She complains of pain directly over the right greater trochanter with aching which is relieved by Tylenol but not relieved by 1 hip bursal injection. Pain is increased after sitting for a long time and she does fall asleep if she takes 2 Tylenol  System review she denies numbness tingling burning pain in her legs dizziness weakness tremors or lightheadedness she does complain of some constipation denies any bladder issues she does have some gait problems and hearing loss  Past Medical History  Diagnosis Date  . Hypertension   . Hypercholesterolemia   . CKD (chronic kidney disease)   . Coronary artery disease     a.  LHC (06/04/05): LHC done in [place] with high grade RCA => s/p BMS to RCA;  b.  Nuclear (09/14/09): Lexiscan; Inf infarct with mild peri-infarct ishemia, EF 52%; Low Risk.  Marland Kitchen GERD (gastroesophageal reflux disease)   . Chronic anemia   . Ischemic cardiomyopathy     a. Echo (07/26/13): Mild LVH, EF 35-40%, diff HK, inf AK, Gr 2 DD, Tr AI, mildly dilated Ao root, MAC, mild MR, mild LAE, mod reduced RVSF.  . Non-small cell carcinoma of lung     Stage IV    Detailed exam  BP 162/94 mmHg  Ht '5\' 8"'$  (1.727 m)  Wt 144 lb (65.318 kg)  BMI 21.90 kg/m2 Her frame is small she is normally developed she is oriented 3 her mood is pleasant she walks with a cane  She has tenderness directly over the right greater trochanter but normal range of motion in both hips both hips remain stable she has no muscle loss in the right leg she has some distal swelling looks to be peripheral edema but normal sensation. Her skin is ecchymotic from her medications which is  causing her some subcutaneous bleeding. Lymph nodes in the groin are normal I ordered x-rays of her back and her hip. Both hips look normal on pelvic x-ray as does her right hip  She has some changes on the back including osteopenia left lumbar scoliosis L5-S1 degenerative disc disease aorta and spondylolisthesis at L4-5  Impression hip bursitis  Inject right hip for bursitis if no improvement return for injection in 2 weeks   We did get verbal consent and we took a timeout to confirm the site of injection  A steroid injection was performed at right greater trochanter using 1% plain Lidocaine and 40 mg of depomedrol . This was well tolerated.

## 2014-11-01 ENCOUNTER — Telehealth: Payer: Self-pay | Admitting: Orthopedic Surgery

## 2014-11-01 ENCOUNTER — Other Ambulatory Visit: Payer: Self-pay | Admitting: *Deleted

## 2014-11-01 DIAGNOSIS — M48061 Spinal stenosis, lumbar region without neurogenic claudication: Secondary | ICD-10-CM

## 2014-11-01 NOTE — Telephone Encounter (Signed)
Needs mri or ct l spine   Order for spinal stenosis   After study schedule for appt

## 2014-11-01 NOTE — Telephone Encounter (Addendum)
PATIENT IS CALLING STATING THAT SHE IS HURTING IN HER LOWER BACK SO BAD, FROM HER WAIST TO HER TAIL BONE,LAST OV 10/25/14, SHE RECEIVED AN INJECTION, PLEASE ADVISE?

## 2014-11-01 NOTE — Telephone Encounter (Signed)
Routing to Dr Harrison 

## 2014-11-01 NOTE — Telephone Encounter (Addendum)
Patient has a stent in her heart so she is stating a CT scan would be better for her, she cant have IV contrast she can only have oral according to the patient

## 2014-11-02 NOTE — Telephone Encounter (Signed)
CT ORDERED

## 2014-11-07 ENCOUNTER — Other Ambulatory Visit (HOSPITAL_COMMUNITY): Payer: Self-pay | Admitting: Internal Medicine

## 2014-11-07 DIAGNOSIS — R609 Edema, unspecified: Secondary | ICD-10-CM

## 2014-11-08 ENCOUNTER — Telehealth: Payer: Self-pay | Admitting: *Deleted

## 2014-11-08 ENCOUNTER — Telehealth: Payer: Self-pay | Admitting: Orthopedic Surgery

## 2014-11-08 ENCOUNTER — Ambulatory Visit (HOSPITAL_COMMUNITY)
Admission: RE | Admit: 2014-11-08 | Discharge: 2014-11-08 | Disposition: A | Discharge status: Home / Self Care | Payer: Medicare Other | Source: Ambulatory Visit | Attending: Orthopedic Surgery | Admitting: Orthopedic Surgery

## 2014-11-08 ENCOUNTER — Other Ambulatory Visit: Payer: Self-pay | Admitting: *Deleted

## 2014-11-08 ENCOUNTER — Ambulatory Visit (HOSPITAL_COMMUNITY)
Admission: RE | Admit: 2014-11-08 | Discharge: 2014-11-08 | Disposition: A | Discharge status: Home / Self Care | Payer: Medicare Other | Source: Ambulatory Visit | Attending: Internal Medicine | Admitting: Internal Medicine

## 2014-11-08 DIAGNOSIS — R609 Edema, unspecified: Secondary | ICD-10-CM

## 2014-11-08 DIAGNOSIS — M545 Low back pain: Secondary | ICD-10-CM | POA: Diagnosis present

## 2014-11-08 DIAGNOSIS — M79604 Pain in right leg: Secondary | ICD-10-CM | POA: Diagnosis not present

## 2014-11-08 DIAGNOSIS — M48061 Spinal stenosis, lumbar region without neurogenic claudication: Secondary | ICD-10-CM

## 2014-11-08 DIAGNOSIS — R938 Abnormal findings on diagnostic imaging of other specified body structures: Secondary | ICD-10-CM | POA: Diagnosis not present

## 2014-11-08 DIAGNOSIS — Z9221 Personal history of antineoplastic chemotherapy: Secondary | ICD-10-CM | POA: Insufficient documentation

## 2014-11-08 DIAGNOSIS — C7951 Secondary malignant neoplasm of bone: Secondary | ICD-10-CM

## 2014-11-08 DIAGNOSIS — Z85118 Personal history of other malignant neoplasm of bronchus and lung: Secondary | ICD-10-CM | POA: Insufficient documentation

## 2014-11-08 NOTE — Telephone Encounter (Signed)
REFERRAL FAXED TO [facility]

## 2014-11-08 NOTE — Telephone Encounter (Signed)
-----   Message from Carole Civil, MD sent at 11/08/2014  5:00 PM EDT ----- Regarding: result Call her

## 2014-11-09 ENCOUNTER — Telehealth: Payer: Self-pay | Admitting: Cardiovascular Disease

## 2014-11-09 ENCOUNTER — Telehealth: Payer: Self-pay | Admitting: Orthopedic Surgery

## 2014-11-09 NOTE — Telephone Encounter (Signed)
Dr. Aline Brochure [name] was on the voicemail returning your call, she stated she can be reached this morning

## 2014-11-09 NOTE — Telephone Encounter (Signed)
New message      Talk to Ander Purpura

## 2014-11-09 NOTE — Telephone Encounter (Signed)
CALLED RESULTS: MASS ON SPINE   [facility] : CALL THEM THIS MORNING

## 2014-11-09 NOTE — Telephone Encounter (Signed)
I spoke with the pt and she is very upset and crying on the phone.  She states that she just received a call from Dr Aline Brochure about the results of her CT. The pt said she has a mass on her spine and this is causing problems with her leg.  The pt said they are going to set her up for a biopsy next.  The pt wants Dr Burt Knack to know about the results of CT and that she is going to need support from our staff to help her with this process.

## 2014-11-09 NOTE — Telephone Encounter (Signed)
thx for letting me know

## 2014-11-09 NOTE — Telephone Encounter (Signed)
Thayer Headings at [facility] confirmed referral was recieved

## 2014-11-10 ENCOUNTER — Telehealth: Payer: Self-pay | Admitting: Internal Medicine

## 2014-11-10 ENCOUNTER — Other Ambulatory Visit: Payer: Self-pay | Admitting: *Deleted

## 2014-11-10 DIAGNOSIS — C3491 Malignant neoplasm of unspecified part of right bronchus or lung: Secondary | ICD-10-CM

## 2014-11-10 NOTE — Telephone Encounter (Signed)
Per Gerald Stabs at [facility], Dr Joya Salm reviewed referral and states that patient should see her oncologist asap, not neurosurgery

## 2014-11-10 NOTE — Telephone Encounter (Signed)
Dr Aline Brochure advised of Dr Harley Hallmark recommendation  Dr Aline Brochure has spoken with patient's oncologist Dr Inda Merlin

## 2014-11-10 NOTE — Telephone Encounter (Signed)
Confirmed appointment for 06/14

## 2014-11-15 ENCOUNTER — Encounter: Payer: Self-pay | Admitting: Oncology

## 2014-11-15 ENCOUNTER — Telehealth: Payer: Self-pay | Admitting: Oncology

## 2014-11-15 ENCOUNTER — Ambulatory Visit (HOSPITAL_BASED_OUTPATIENT_CLINIC_OR_DEPARTMENT_OTHER): Payer: Medicare Other | Admitting: Oncology

## 2014-11-15 ENCOUNTER — Other Ambulatory Visit (HOSPITAL_BASED_OUTPATIENT_CLINIC_OR_DEPARTMENT_OTHER): Payer: Medicare Other

## 2014-11-15 VITALS — BP 137/65 | HR 72 | Temp 97.4°F | Resp 18 | Ht 68.0 in | Wt 145.5 lb

## 2014-11-15 DIAGNOSIS — Z85118 Personal history of other malignant neoplasm of bronchus and lung: Secondary | ICD-10-CM

## 2014-11-15 DIAGNOSIS — C3491 Malignant neoplasm of unspecified part of right bronchus or lung: Secondary | ICD-10-CM

## 2014-11-15 DIAGNOSIS — N189 Chronic kidney disease, unspecified: Secondary | ICD-10-CM

## 2014-11-15 LAB — COMPREHENSIVE METABOLIC PANEL (CC13)
ALK PHOS: 137 U/L (ref 40–150)
ALT: 12 U/L (ref 0–55)
AST: 21 U/L (ref 5–34)
Albumin: 2.9 g/dL — ABNORMAL LOW (ref 3.5–5.0)
Anion Gap: 17 mEq/L — ABNORMAL HIGH (ref 3–11)
BILIRUBIN TOTAL: 0.68 mg/dL (ref 0.20–1.20)
BUN: 54.2 mg/dL — ABNORMAL HIGH (ref 7.0–26.0)
CO2: 24 mEq/L (ref 22–29)
Calcium: 9.5 mg/dL (ref 8.4–10.4)
Chloride: 96 mEq/L — ABNORMAL LOW (ref 98–109)
Creatinine: 2.8 mg/dL — ABNORMAL HIGH (ref 0.6–1.1)
EGFR: 15 mL/min/{1.73_m2} — ABNORMAL LOW (ref >90)
Glucose: 125 mg/dl (ref 70–140)
Potassium: 3.9 mEq/L (ref 3.5–5.1)
SODIUM: 137 meq/L (ref 136–145)
TOTAL PROTEIN: 7.5 g/dL (ref 6.4–8.3)

## 2014-11-15 LAB — CBC WITH DIFFERENTIAL/PLATELET
BASO%: 0.4 % (ref 0.0–2.0)
Basophils Absolute: 0 10*3/uL (ref 0.0–0.1)
EOS%: 0.7 % (ref 0.0–7.0)
Eosinophils Absolute: 0.1 10*3/uL (ref 0.0–0.5)
HCT: 31.5 % — ABNORMAL LOW (ref 34.8–46.6)
HGB: 10.4 g/dL — ABNORMAL LOW (ref 11.6–15.9)
LYMPH#: 0.6 10*3/uL — AB (ref 0.9–3.3)
LYMPH%: 5.2 % — ABNORMAL LOW (ref 14.0–49.7)
MCH: 26.6 pg (ref 25.1–34.0)
MCHC: 33.1 g/dL (ref 31.5–36.0)
MCV: 80.3 fL (ref 79.5–101.0)
MONO#: 1 10*3/uL — ABNORMAL HIGH (ref 0.1–0.9)
MONO%: 9 % (ref 0.0–14.0)
NEUT#: 9 10*3/uL — ABNORMAL HIGH (ref 1.5–6.5)
NEUT%: 84.7 % — ABNORMAL HIGH (ref 38.4–76.8)
Platelets: 375 10*3/uL (ref 145–400)
RBC: 3.92 10*6/uL (ref 3.70–5.45)
RDW: 14.9 % — ABNORMAL HIGH (ref 11.2–14.5)
WBC: 10.6 10*3/uL — ABNORMAL HIGH (ref 3.9–10.3)

## 2014-11-15 MED ORDER — HYDROCODONE-ACETAMINOPHEN 5-325 MG PO TABS: 1.0000 | ORAL_TABLET | Freq: Four times a day (QID) | ORAL | Status: DC | PRN

## 2014-11-15 NOTE — Progress Notes (Signed)
[place] Telephone:([phone]) [phone]   Fax:(336) [place], MD  [place] [address]  PRINCIPAL DIAGNOSIS: Stage IV non-small cell lung cancer diagnosed in March 2007.   PRIOR THERAPY: Status post 6 cycles of systemic chemotherapy with carboplatin and docetaxel. Last dose was given January 16, 2006.   CURRENT THERAPY: Observation.  DISEASE STAGE: Stage IV non-small cell lung cancer diagnosed in March of 2007.  CHEMOTHERAPY INTENT: Palliative  CURRENT # OF CHEMOTHERAPY CYCLES: 0  CURRENT ANTIEMETICS: None  CURRENT SMOKING STATUS: Nonsmoker  ORAL CHEMOTHERAPY AND CONSENT: None  CURRENT BISPHOSPHONATES USE: None  LIVING WILL AND CODE STATUS: Full code  INTERVAL HISTORY: [name] 81 y.o. female returns to the clinic today for a work-in appointment. She is accompanied by her daughter. Over the past 3 -4 weeks, the patient has been having more difficulty with right hip and back pain. She developed swelling of the right leg starting about 3 weeks ago. She has been seen by her PCP and Ortho regarding this pain and swelling. A Doppler U/S was performed on the right leg and negative for DVT. Due to her pain, a CT of the lumbar spine was performed which showed a soft tissue mass at the lumbosacral junction. She was referred back to [place] for evaluation of her mass.   The patient is tearful today. Using Hydrocodone sparingly. Seems to help the pain. Indicates that her left leg has also  Started swelling within the past 5 days. She denied having any significant chest pain, shortness of breath, cough or hemoptysis. Appetite has been poor and she has  Lost about 7 lbs since her visit with [place] in Feb 2016. The patient denied having any night sweats. Reports skin breakdown on her coccyx.   MEDICAL HISTORY: Past Medical History  Diagnosis Date  . Hypertension   . Hypercholesterolemia   . CKD (chronic kidney disease)   .  Coronary artery disease     a.  LHC (06/04/05): LHC done in [place] with high grade RCA => s/p BMS to RCA;  b.  Nuclear (09/14/09): Lexiscan; Inf infarct with mild peri-infarct ishemia, EF 52%; Low Risk.  [place] Kitchen GERD (gastroesophageal reflux disease)   . Chronic anemia   . Ischemic cardiomyopathy     a. Echo (07/26/13): Mild LVH, EF 35-40%, diff HK, inf AK, Gr 2 DD, Tr AI, mildly dilated Ao root, MAC, mild MR, mild LAE, mod reduced RVSF.  . Non-small cell carcinoma of lung     Stage IV    ALLERGIES:  is allergic to amlodipine; ciprofloxacin; statins; aspirin; cephalexin; hydralazine; iron; sulfonamide derivatives; and penicillins.  MEDICATIONS:  Current Outpatient Prescriptions  Medication Sig Dispense Refill  . acetaminophen (TYLENOL) 500 MG tablet Take 500 mg by mouth every 6 (six) hours as needed.    . cloNIDine (CATAPRES) 0.2 MG tablet Take 1 tablet (0.2 mg total) by mouth 2 (two) times daily. 60 tablet 11  . clopidogrel (PLAVIX) 75 MG tablet TAKE ONE TABLET DAILY. 90 tablet 3  . metoprolol succinate (TOPROL-XL) 25 MG 24 hr tablet TAKE 1/2 TABLET BY MOUTH AT BEDTIME. 15 tablet 6  . pantoprazole (PROTONIX) 40 MG tablet TAKE ONE TABLET BY MOUTH ONCE DAILY. 90 tablet 1  . furosemide (LASIX) 40 MG tablet Take 1 tablet (40 mg total) by mouth daily. (Patient not taking: Reported on 11/15/2014) 90 tablet 3  . HYDROcodone-acetaminophen (NORCO/VICODIN) 5-325 MG per tablet  Take 1 tablet by mouth every 6 (six) hours as needed. 30 tablet 0   No current facility-administered medications for this visit.    SURGICAL HISTORY:  Past Surgical History  Procedure Laterality Date  . Hematoma evacuation  December 2006    groin  . Appendectomy    . Cataract extraction    . Hemorroidectomy    . Laminectomy      REVIEW OF SYSTEMS:  Constitutional: positive for anorexia, fatigue and weight loss Eyes: negative Ears, nose, mouth, throat, and face: negative Respiratory: negative Cardiovascular:  negative Gastrointestinal: negative Genitourinary:negative Integument/breast: negative Hematologic/lymphatic: negative Musculoskeletal:positive for back pain and R hip pain Neurological: negative Behavioral/Psych: negative Endocrine: negative Allergic/Immunologic: negative   PHYSICAL EXAMINATION: General appearance: alert, cooperative and no distress Head: Normocephalic, without obvious abnormality, atraumatic Neck: no adenopathy Lymph nodes: Cervical, supraclavicular, and axillary nodes normal. Resp: clear to auscultation bilaterally Back: symmetric, no curvature. ROM normal. No CVA tenderness. Cardio: regular rate and rhythm, S1, S2 normal, no murmur, click, rub or gallop GI: soft, non-tender; bowel sounds normal; no masses,  no organomegaly Extremities: edema 4+ in RLE, 1+ in LLE.  Neurologic: Alert and oriented X 3, normal strength and tone. Normal symmetric reflexes. Normal coordination and gait  Skin: 1.5 cm ulcer to right coccyx.  No drainage.  ECOG PERFORMANCE STATUS: 1 - Symptomatic but completely ambulatory  Blood pressure 137/65, pulse 72, temperature 97.4 F (36.3 C), temperature source Oral, resp. rate 18, height '5\' 8"'$  (1.727 m), weight 145 lb 8 oz (65.998 kg), SpO2 97 %.  LABORATORY DATA: Lab Results  Component Value Date   WBC 10.6* 11/15/2014   HGB 10.4* 11/15/2014   HCT 31.5* 11/15/2014   MCV 80.3 11/15/2014   PLT 375 11/15/2014      Chemistry      Component Value Date/Time   NA 137 11/15/2014 1445   NA 135 07/28/2014 2236   NA 143 12/23/2011 0830   K 3.9 11/15/2014 1445   K 3.5 07/28/2014 2236   K 4.7 12/23/2011 0830   CL 101 07/28/2014 2236   CL 104 06/19/2012 0958   CL 101 12/23/2011 0830   CO2 24 11/15/2014 1445   CO2 27 07/28/2014 2236   CO2 27 12/23/2011 0830   BUN 54.2* 11/15/2014 1445   BUN 35* 07/28/2014 2236   BUN 25* 12/23/2011 0830   CREATININE 2.8* 11/15/2014 1445   CREATININE 2.20* 07/28/2014 2236   CREATININE 2.09* 02/01/2014  1545      Component Value Date/Time   CALCIUM 9.5 11/15/2014 1445   CALCIUM 8.5 07/28/2014 2236   CALCIUM 9.0 12/23/2011 0830   ALKPHOS 137 11/15/2014 1445   ALKPHOS 76 12/23/2011 0830   ALKPHOS 91 04/08/2011 1023   AST 21 11/15/2014 1445   AST 16 12/23/2011 0830   AST 17 04/08/2011 1023   ALT 12 11/15/2014 1445   ALT 16 12/23/2011 0830   ALT 10 04/08/2011 1023   BILITOT 0.68 11/15/2014 1445   BILITOT 0.60 12/23/2011 0830   BILITOT 0.4 04/08/2011 1023       RADIOGRAPHIC STUDIES: Dg Lumbar Spine 2-3 Views  10/26/2014   Diagnostic report  3 views lumbar spine  Patient complaint of right hip pain  The patient has a severe amount of osteopenia. She has some  spondylolisthesis. She has some degenerative disc changes.  No fracture or tumor is seen although x-rays are obscured by gas pattern  in the bowel  Impression no acute process but chronic spondylosis  Ct  Lumbar Spine Wo Contrast  11/08/2014   CLINICAL DATA:  Low back pain for 3 weeks. Two falls in 1 day. Severe right hip and right leg pain. Personal history of stage IV non-small cell lung cancer. Prior chemotherapy. No current therapy.  EXAM: CT LUMBAR SPINE WITHOUT CONTRAST  TECHNIQUE: Multidetector CT imaging of the lumbar spine was performed without intravenous contrast administration. Multiplanar CT image reconstructions were also generated.  COMPARISON:  Lumbar spine radiographs 10/25/2014. CT abdomen and pelvis 06/23/2013  FINDINGS: Vertebral alignment is unchanged with grade 1 anterolisthesis of L3 on L4 and L4 on L5 and grade 1 retrolisthesis of L5 on S1. There is slight lumbar levoscoliosis. Disc space narrowing is mild at L4-5 and moderate to severe at L5-S1 and with associated vacuum disc phenomenon.  There is a new, large right-sided lumbosacral paravertebral soft tissue mass which measures approximately 6.9 x 7.1 x 10.5 cm. This invades the lateral aspects of the L5 greater than L4 vertebral bodies. There is involvement of the  right L5 pedicle and transverse process, and there is also prominent involvement of the right sacral ala and right S1 vertebral body. The mass displaces the right psoas muscle laterally and encases the right internal iliac artery as well as right common, internal and external iliac veins. It also partially encircles the lower most IVC. There is a pathologic compression fracture of the right half of the L5 vertebral body with mild vertebral body height loss. Tumor extends into the superior aspect of the right L5-S1 neural foramen.  Extensive atherosclerotic vascular calcification is present in the abdomen and pelvis. The kidneys are atrophic with similar appearance of small hypodense renal lesions most likely representing cysts. Cholecystectomy clips are noted.  L1-2: Mild disc bulging without significant stenosis.  L2-3: Mild disc bulging and mild facet and ligamentum flavum hypertrophy result in mild right lateral recess stenosis and mild right and minimal left neural foraminal stenosis without significant spinal stenosis.  L3-4: Listhesis with disc uncovering, ligamentum flavum hypertrophy, and severe bilateral facet arthrosis result in mild to moderate right and minimal left neural foraminal narrowing without significant spinal stenosis.  L4-5: Listhesis with disc uncovering and moderate facet arthrosis without significant stenosis. The right L4 nerve root is likely involved by tumor lateral to the foramen.  L5-S1: Listhesis with disc uncovering and mild facet arthrosis without significant stenosis. The right L5 nerve root is likely involved by tumor lateral to the foramen.  IMPRESSION: New 11 cm destructive right-sided paravertebral soft tissue mass at the lumbosacral junction, consistent with metastasis. There is osseous involvement at L4, L5, and S1 with pathologic L5 compression fracture. Right-sided iliac arterial and venous encasement as above.  These results were called by telephone at the time of  interpretation on 11/08/2014 at 4:01 pm to Dr. Arther Abbott , who verbally acknowledged these results.   Electronically Signed   By: Logan Bores   On: 11/08/2014 16:05   US Venous Img Lower Unilateral Right  11/08/2014   CLINICAL DATA:  Right lower extremity pain and edema. History of smoking. History of lung cancer. Evaluate for DVT.  EXAM: RIGHT LOWER EXTREMITY VENOUS DOPPLER ULTRASOUND  TECHNIQUE: Gray-scale sonography with graded compression, as well as color Doppler and duplex ultrasound were performed to evaluate the lower extremity deep venous systems from the level of the common femoral vein and including the common femoral, femoral, profunda femoral, popliteal and calf veins including the posterior tibial, peroneal and gastrocnemius veins when visible. The superficial great saphenous vein  was also interrogated. Spectral Doppler was utilized to evaluate flow at rest and with distal augmentation maneuvers in the common femoral, femoral and popliteal veins.  COMPARISON:  None.  FINDINGS: Contralateral Common Femoral Vein: Respiratory phasicity is normal and symmetric with the symptomatic side. No evidence of thrombus. Normal compressibility.  Common Femoral Vein: No evidence of thrombus. Normal compressibility, respiratory phasicity and response to augmentation.  Saphenofemoral Junction: No evidence of thrombus. Normal compressibility and flow on color Doppler imaging.  Profunda Femoral Vein: No evidence of thrombus. Normal compressibility and flow on color Doppler imaging.  Femoral Vein: No evidence of thrombus. Normal compressibility, respiratory phasicity and response to augmentation.  Popliteal Vein: No evidence of thrombus. Normal compressibility, respiratory phasicity and response to augmentation.  Calf Veins: No evidence of thrombus. Normal compressibility and flow on color Doppler imaging.  Superficial Great Saphenous Vein: No evidence of thrombus. Normal compressibility and flow on color Doppler  imaging.  Venous Reflux:  None.  Other Findings: Subcutaneous edema is noted at the level of the calf and ankle (representative images 38, 39 and 40).  IMPRESSION: No evidence of DVT within the right lower extremity.   Electronically Signed   By: Sandi Mariscal M.D.   On: 11/08/2014 15:41   Dg Hip Unilat With Pelvis 1v Right  10/26/2014   X-rays report right hip with pelvis  Patient complaint of right hip pain  Right hip shows no fracture dislocation avascular necrosis changes or  arthritic changes  Impression normal right hip   ASSESSMENT AND PLAN: This is a very pleasant 81 year old white female with history of stage IV non-small cell lung cancer diagnosed in March of 2007 status post 6 cycles of systemic chemotherapy with carboplatin and docetaxel and has been observation since that time with no evidence for disease progression.   Patient was seen and examined today with Dr. Julien Nordmann. Results of CT scan were discussed with patient and daughter. Explained that further work up including a PET scan and MRI of the brain is needed to determine the extent of disease. We also need to obtain a biopsy of the mass to determine if this is a recurrence of her previous lung cancer vs. a new primary.  The biopsy can be performed by Interventional Radiology. Explained that once we have results, treatment can be determined.   I have given [name] a refill of her Hydrocodone today. She was instructed to contact us if her pain is not controlled with the current medications and we can change to something stronger.  For the ulcer to her coccyx, She was encouraged to keep this area moist with wet to dry dressings.  I have requested a follow-up visit with [place] in approximately 2 weeks to discuss the results.  The patient was advised to call immediately if she has any concerning symptoms in the interval.  All questions were answered. The patient knows to call the clinic with any problems, questions or concerns. We can  certainly see the patient much sooner if necessary.  Mikey Bussing, DNP, AGPCNP-BC, AOCNP   ADDENDUM: Hematology/Oncology Attending: I had a face to face encounter with the patient. I recommended her care plan. This is a very pleasant 81 years old white female with stage IV non-small cell lung cancer diagnosed in March 2007 status post 6 cycles of systemic chemotherapy with carboplatin and docetaxel and has been on observation for the last 9 years with no evidence for disease recurrence. Unfortunately she recently presented with severe back  pain and CT scan of the lumbar spine performed on 11/08/2014 showed new 11 cm destructive right-sided paravertebral soft tissue mass at the lumbosacral junction consistent with metastasis. I had a lengthy discussion with the patient and her daughter about her current condition and further investigation to confirm the diagnosis as well as treatment options. I recommended for the patient to have a PET scan as well as MRI of the brain to complete the staging workup of her disease. I will also refer the patient to interventional radiology for consideration of CT-guided core biopsy of the right-sided paravertebral soft tissue mass. For pain management we gave the patient refill of hydrocodone and she was encouraged to call us if she has no control of her pain. I would see the patient back for follow-up visit in 2 weeks for reevaluation and more detailed discussion of her treatment options based on the imaging and biopsy results. She was advised to call immediately if she has any concerning symptoms in the interval.  Disclaimer: This note was dictated with voice recognition software. Similar sounding words can inadvertently be transcribed and may not be corrected upon review. Eilleen Kempf., MD 11/16/2014

## 2014-11-15 NOTE — Telephone Encounter (Signed)
per pof to sch pt appt-no available slots-sent Dr Blima Rich a staff message to adv if he can see pt week og 6/27-adv pt will call w appt after reply

## 2014-11-16 ENCOUNTER — Telehealth: Payer: Self-pay

## 2014-11-16 ENCOUNTER — Telehealth (HOSPITAL_COMMUNITY): Payer: Self-pay

## 2014-11-16 ENCOUNTER — Telehealth: Payer: Self-pay | Admitting: Internal Medicine

## 2014-11-16 MED ORDER — LORAZEPAM 1 MG PO TABS: 1.0000 mg | ORAL_TABLET | Freq: Three times a day (TID) | ORAL | Status: DC

## 2014-11-16 NOTE — Telephone Encounter (Signed)
per pof to sch pt appt reply from [facility] & spoke to pt daughter [name] and gave appt time & date

## 2014-11-16 NOTE — Telephone Encounter (Signed)
Called and informed pt to not take her Plavix on day of biopsy. Pt denies any other blood thinners. Also informed pt Ativan will be called into pharmacy for anxiety and informed of directions on how to take. Pt verbalized understanding and requests rx to be filled at [facility]. Called prescription into pharmacy per [facility] verbal order.

## 2014-11-17 ENCOUNTER — Ambulatory Visit: Payer: Medicare Other | Admitting: Orthopedic Surgery

## 2014-11-21 ENCOUNTER — Other Ambulatory Visit: Payer: Self-pay | Admitting: Radiology

## 2014-11-22 ENCOUNTER — Other Ambulatory Visit: Payer: Self-pay | Admitting: Radiology

## 2014-11-23 ENCOUNTER — Inpatient Hospital Stay (HOSPITAL_COMMUNITY)
Admission: EM | Admit: 2014-11-23 | Discharge: 2014-11-29 | DRG: 854 | Disposition: A | Discharge status: Skilled Nursing Facility | Payer: Medicare Other | Attending: Internal Medicine | Admitting: Internal Medicine

## 2014-11-23 ENCOUNTER — Ambulatory Visit (HOSPITAL_COMMUNITY)
Admission: RE | Admit: 2014-11-23 | Discharge: 2014-11-23 | Disposition: A | Discharge status: Home / Self Care | Payer: Medicare Other | Source: Ambulatory Visit | Attending: Oncology | Admitting: Oncology

## 2014-11-23 ENCOUNTER — Encounter (HOSPITAL_COMMUNITY): Payer: Self-pay | Admitting: Emergency Medicine

## 2014-11-23 ENCOUNTER — Encounter (HOSPITAL_COMMUNITY): Payer: Self-pay

## 2014-11-23 ENCOUNTER — Emergency Department (HOSPITAL_COMMUNITY): Payer: Medicare Other

## 2014-11-23 DIAGNOSIS — Z87891 Personal history of nicotine dependence: Secondary | ICD-10-CM

## 2014-11-23 DIAGNOSIS — R609 Edema, unspecified: Secondary | ICD-10-CM

## 2014-11-23 DIAGNOSIS — R7881 Bacteremia: Secondary | ICD-10-CM

## 2014-11-23 DIAGNOSIS — N179 Acute kidney failure, unspecified: Secondary | ICD-10-CM | POA: Diagnosis not present

## 2014-11-23 DIAGNOSIS — C7951 Secondary malignant neoplasm of bone: Secondary | ICD-10-CM

## 2014-11-23 DIAGNOSIS — D631 Anemia in chronic kidney disease: Secondary | ICD-10-CM | POA: Diagnosis present

## 2014-11-23 DIAGNOSIS — L8990 Pressure ulcer of unspecified site, unspecified stage: Secondary | ICD-10-CM | POA: Insufficient documentation

## 2014-11-23 DIAGNOSIS — A419 Sepsis, unspecified organism: Principal | ICD-10-CM

## 2014-11-23 DIAGNOSIS — I129 Hypertensive chronic kidney disease with stage 1 through stage 4 chronic kidney disease, or unspecified chronic kidney disease: Secondary | ICD-10-CM | POA: Diagnosis present

## 2014-11-23 DIAGNOSIS — C7800 Secondary malignant neoplasm of unspecified lung: Secondary | ICD-10-CM

## 2014-11-23 DIAGNOSIS — Z886 Allergy status to analgesic agent status: Secondary | ICD-10-CM

## 2014-11-23 DIAGNOSIS — K219 Gastro-esophageal reflux disease without esophagitis: Secondary | ICD-10-CM | POA: Diagnosis present

## 2014-11-23 DIAGNOSIS — I10 Essential (primary) hypertension: Secondary | ICD-10-CM

## 2014-11-23 DIAGNOSIS — L500 Allergic urticaria: Secondary | ICD-10-CM | POA: Diagnosis not present

## 2014-11-23 DIAGNOSIS — Z9221 Personal history of antineoplastic chemotherapy: Secondary | ICD-10-CM

## 2014-11-23 DIAGNOSIS — Z85118 Personal history of other malignant neoplasm of bronchus and lung: Secondary | ICD-10-CM

## 2014-11-23 DIAGNOSIS — I82412 Acute embolism and thrombosis of left femoral vein: Secondary | ICD-10-CM | POA: Diagnosis present

## 2014-11-23 DIAGNOSIS — Z880 Allergy status to penicillin: Secondary | ICD-10-CM

## 2014-11-23 DIAGNOSIS — Z882 Allergy status to sulfonamides status: Secondary | ICD-10-CM

## 2014-11-23 DIAGNOSIS — I82411 Acute embolism and thrombosis of right femoral vein: Secondary | ICD-10-CM | POA: Diagnosis present

## 2014-11-23 DIAGNOSIS — B964 Proteus (mirabilis) (morganii) as the cause of diseases classified elsewhere: Secondary | ICD-10-CM | POA: Diagnosis present

## 2014-11-23 DIAGNOSIS — Z888 Allergy status to other drugs, medicaments and biological substances status: Secondary | ICD-10-CM

## 2014-11-23 DIAGNOSIS — R509 Fever, unspecified: Secondary | ICD-10-CM | POA: Diagnosis not present

## 2014-11-23 DIAGNOSIS — M7989 Other specified soft tissue disorders: Secondary | ICD-10-CM | POA: Diagnosis present

## 2014-11-23 DIAGNOSIS — N1330 Unspecified hydronephrosis: Secondary | ICD-10-CM

## 2014-11-23 DIAGNOSIS — M21371 Foot drop, right foot: Secondary | ICD-10-CM | POA: Diagnosis present

## 2014-11-23 DIAGNOSIS — C3490 Malignant neoplasm of unspecified part of unspecified bronchus or lung: Secondary | ICD-10-CM

## 2014-11-23 DIAGNOSIS — N184 Chronic kidney disease, stage 4 (severe): Secondary | ICD-10-CM | POA: Diagnosis present

## 2014-11-23 DIAGNOSIS — N390 Urinary tract infection, site not specified: Secondary | ICD-10-CM

## 2014-11-23 DIAGNOSIS — I2510 Atherosclerotic heart disease of native coronary artery without angina pectoris: Secondary | ICD-10-CM | POA: Diagnosis present

## 2014-11-23 DIAGNOSIS — D630 Anemia in neoplastic disease: Secondary | ICD-10-CM | POA: Diagnosis present

## 2014-11-23 DIAGNOSIS — E780 Pure hypercholesterolemia: Secondary | ICD-10-CM | POA: Diagnosis present

## 2014-11-23 DIAGNOSIS — C7989 Secondary malignant neoplasm of other specified sites: Secondary | ICD-10-CM | POA: Insufficient documentation

## 2014-11-23 DIAGNOSIS — C3491 Malignant neoplasm of unspecified part of right bronchus or lung: Secondary | ICD-10-CM

## 2014-11-23 DIAGNOSIS — I5032 Chronic diastolic (congestive) heart failure: Secondary | ICD-10-CM | POA: Diagnosis present

## 2014-11-23 DIAGNOSIS — R222 Localized swelling, mass and lump, trunk: Secondary | ICD-10-CM

## 2014-11-23 DIAGNOSIS — N131 Hydronephrosis with ureteral stricture, not elsewhere classified: Secondary | ICD-10-CM | POA: Diagnosis present

## 2014-11-23 DIAGNOSIS — C763 Malignant neoplasm of pelvis: Secondary | ICD-10-CM | POA: Diagnosis present

## 2014-11-23 LAB — COMPREHENSIVE METABOLIC PANEL
ALT: 11 U/L — ABNORMAL LOW (ref 14–54)
AST: 22 U/L (ref 15–41)
Albumin: 3 g/dL — ABNORMAL LOW (ref 3.5–5.0)
Alkaline Phosphatase: 117 U/L (ref 38–126)
Anion gap: 15 (ref 5–15)
BILIRUBIN TOTAL: 0.6 mg/dL (ref 0.3–1.2)
BUN: 58 mg/dL — AB (ref 6–20)
CHLORIDE: 95 mmol/L — AB (ref 101–111)
CO2: 21 mmol/L — ABNORMAL LOW (ref 22–32)
Calcium: 8.5 mg/dL — ABNORMAL LOW (ref 8.9–10.3)
Creatinine, Ser: 3.31 mg/dL — ABNORMAL HIGH (ref 0.44–1.00)
GFR calc Af Amer: 14 mL/min — ABNORMAL LOW (ref >60)
GFR, EST NON AFRICAN AMERICAN: 12 mL/min — AB (ref >60)
Glucose, Bld: 174 mg/dL — ABNORMAL HIGH (ref 65–99)
Potassium: 3.9 mmol/L (ref 3.5–5.1)
SODIUM: 131 mmol/L — AB (ref 135–145)
Total Protein: 7.3 g/dL (ref 6.5–8.1)

## 2014-11-23 LAB — URINALYSIS, ROUTINE W REFLEX MICROSCOPIC
Bilirubin Urine: NEGATIVE
Glucose, UA: NEGATIVE mg/dL
Ketones, ur: NEGATIVE mg/dL
Nitrite: POSITIVE — AB
Protein, ur: 100 mg/dL — AB
Specific Gravity, Urine: 1.014 (ref 1.005–1.030)
Urobilinogen, UA: 1 mg/dL (ref 0.0–1.0)
pH: 8.5 — ABNORMAL HIGH (ref 5.0–8.0)

## 2014-11-23 LAB — CBC
HCT: 32.7 % — ABNORMAL LOW (ref 36.0–46.0)
HCT: 30.6 % — ABNORMAL LOW (ref 36.0–46.0)
Hemoglobin: 10.2 g/dL — ABNORMAL LOW (ref 12.0–15.0)
Hemoglobin: 9.8 g/dL — ABNORMAL LOW (ref 12.0–15.0)
MCH: 25.6 pg — AB (ref 26.0–34.0)
MCH: 26.1 pg (ref 26.0–34.0)
MCHC: 31.2 g/dL (ref 30.0–36.0)
MCHC: 32 g/dL (ref 30.0–36.0)
MCV: 82.2 fL (ref 78.0–100.0)
MCV: 81.6 fL (ref 78.0–100.0)
Platelets: 336 10*3/uL (ref 150–400)
Platelets: 323 K/uL (ref 150–400)
RBC: 3.98 MIL/uL (ref 3.87–5.11)
RBC: 3.75 MIL/uL — ABNORMAL LOW (ref 3.87–5.11)
RDW: 14 % (ref 11.5–15.5)
RDW: 14.1 % (ref 11.5–15.5)
WBC: 18.5 10*3/uL — ABNORMAL HIGH (ref 4.0–10.5)
WBC: 18 K/uL — ABNORMAL HIGH (ref 4.0–10.5)

## 2014-11-23 LAB — DIFFERENTIAL
BASOS ABS: 0 10*3/uL (ref 0.0–0.1)
BASOS PCT: 0 % (ref 0–1)
Eosinophils Absolute: 0 10*3/uL (ref 0.0–0.7)
Eosinophils Relative: 0 % (ref 0–5)
LYMPHS PCT: 4 % — AB (ref 12–46)
Lymphs Abs: 0.8 10*3/uL (ref 0.7–4.0)
Monocytes Absolute: 0.7 10*3/uL (ref 0.1–1.0)
Monocytes Relative: 4 % (ref 3–12)
NEUTROS ABS: 16.5 10*3/uL — AB (ref 1.7–7.7)
Neutrophils Relative %: 92 % — ABNORMAL HIGH (ref 43–77)

## 2014-11-23 LAB — PROTIME-INR
INR: 1.22 (ref 0.00–1.49)
PROTHROMBIN TIME: 15.6 s — AB (ref 11.6–15.2)

## 2014-11-23 LAB — URINE MICROSCOPIC-ADD ON

## 2014-11-23 LAB — APTT: aPTT: 36 s (ref 24–37)

## 2014-11-23 MED ORDER — FENTANYL CITRATE (PF) 100 MCG/2ML IJ SOLN
INTRAMUSCULAR | Status: AC | PRN
  Administered 2014-11-23: 25 ug via INTRAVENOUS

## 2014-11-23 MED ORDER — SODIUM CHLORIDE 0.9 % IV SOLN
Freq: Once | INTRAVENOUS | Status: AC
  Administered 2014-11-23: 08:00:00 via INTRAVENOUS

## 2014-11-23 MED ORDER — MORPHINE SULFATE 2 MG/ML IJ SOLN
2.0000 mg | Freq: Once | INTRAMUSCULAR | Status: AC
  Administered 2014-11-23: 2 mg via INTRAVENOUS
  Filled 2014-11-23: qty 1

## 2014-11-23 MED ORDER — ONDANSETRON HCL 4 MG/2ML IJ SOLN
4.0000 mg | Freq: Once | INTRAMUSCULAR | Status: AC
  Administered 2014-11-23: 4 mg via INTRAVENOUS
  Filled 2014-11-23: qty 2

## 2014-11-23 MED ORDER — MIDAZOLAM HCL 2 MG/2ML IJ SOLN
INTRAMUSCULAR | Status: AC | PRN
  Administered 2014-11-23 (×2): 0.5 mg via INTRAVENOUS

## 2014-11-23 MED ORDER — MIDAZOLAM HCL 2 MG/2ML IJ SOLN
INTRAMUSCULAR | Status: AC
  Filled 2014-11-23: qty 4

## 2014-11-23 MED ORDER — FENTANYL CITRATE (PF) 100 MCG/2ML IJ SOLN
INTRAMUSCULAR | Status: AC
  Filled 2014-11-23: qty 2

## 2014-11-23 MED ORDER — SODIUM CHLORIDE 0.9 % IV BOLUS (SEPSIS)
500.0000 mL | Freq: Once | INTRAVENOUS | Status: AC
  Administered 2014-11-23: 500 mL via INTRAVENOUS

## 2014-11-23 NOTE — H&P (Signed)
Chief Complaint: "I'm having a biopsy"  Referring Physician(s): Curcio,Kristin R  History of Present Illness: [name] is a 81 y.o. female with history of stage IV non-small cell lung cancer initially diagnosed in March 2007, status post systemic chemotherapy. She  recently began to have low back, right hip and leg pain with follow up CT scan revealing a new 11 cm destructive right-sided paravertebral soft tissue mass of the lumbosacral junction, concerning for metastasis. She presents today for CT-guided biopsy of the paravertebral soft tissue mass.  Past Medical History  Diagnosis Date  . Hypertension   . Hypercholesterolemia   . CKD (chronic kidney disease)   . Coronary artery disease     a.  LHC (06/04/05): LHC done in [place] with high grade RCA => s/p BMS to RCA;  b.  Nuclear (09/14/09): Lexiscan; Inf infarct with mild peri-infarct ishemia, EF 52%; Low Risk.  Marland Kitchen GERD (gastroesophageal reflux disease)   . Chronic anemia   . Ischemic cardiomyopathy     a. Echo (07/26/13): Mild LVH, EF 35-40%, diff HK, inf AK, Gr 2 DD, Tr AI, mildly dilated Ao root, MAC, mild MR, mild LAE, mod reduced RVSF.  . Non-small cell carcinoma of lung     Stage IV    Past Surgical History  Procedure Laterality Date  . Hematoma evacuation  December 2006    groin  . Appendectomy    . Cataract extraction    . Hemorroidectomy    . Laminectomy      Allergies: Amlodipine; Ciprofloxacin; Statins; Aspirin; Cephalexin; Hydralazine; Iron; Sulfonamide derivatives; and Penicillins  Medications: Prior to Admission medications   Medication Sig Start Date End Date Taking? Authorizing Provider  acetaminophen (TYLENOL) 500 MG tablet Take 500 mg by mouth every 6 (six) hours as needed for moderate pain.    Yes Historical Provider, MD  cloNIDine (CATAPRES) 0.2 MG tablet Take 1 tablet (0.2 mg total) by mouth 2 (two) times daily. 05/16/14  Yes Scott Joylene Draft, PA-C  furosemide (LASIX) 40 MG tablet  Take 1 tablet (40 mg total) by mouth daily. 02/03/14  Yes Sherren Mocha, MD  HYDROcodone-acetaminophen (NORCO/VICODIN) 5-325 MG per tablet Take 1 tablet by mouth every 6 (six) hours as needed. 11/15/14  Yes Maryanna Shape, NP  LORazepam (ATIVAN) 1 MG tablet Take 1 tablet (1 mg total) by mouth every 8 (eight) hours. 11/16/14  Yes Maryanna Shape, NP  metoprolol succinate (TOPROL-XL) 25 MG 24 hr tablet TAKE 1/2 TABLET BY MOUTH AT BEDTIME. 08/26/14  Yes Sherren Mocha, MD  pantoprazole (PROTONIX) 40 MG tablet TAKE ONE TABLET BY MOUTH ONCE DAILY. 10/14/14  Yes Sherren Mocha, MD  clopidogrel (PLAVIX) 75 MG tablet TAKE ONE TABLET DAILY. 04/06/14   Sherren Mocha, MD     Family History  Problem Relation Age of Onset  . Heart failure Mother   . Lung cancer Sister   . Lung cancer Brother   . Stomach cancer Brother   . Heart attack Neg Hx   . Stroke Neg Hx     History   Social History  . Marital Status: Widowed    Spouse Name: N/A  . Number of Children: N/A  . Years of Education: N/A   Social History Main Topics  . Smoking status: Former Smoker    Types: Cigarettes  . Smokeless tobacco: Not on file  . Alcohol Use: No  . Drug Use: Not on file  . Sexual Activity: Not on file   Other Topics  Concern  . None   Social History Narrative     Review of Systems   Constitutional: Positive for fever, chills and unexpected weight change.  HENT: Positive for hearing loss.   Respiratory: Negative for cough and shortness of breath.   Cardiovascular: Positive for leg swelling. Negative for chest pain.  Gastrointestinal: Positive for nausea. Negative for vomiting, abdominal pain and blood in stool.  Genitourinary: Negative for dysuria and hematuria.  Musculoskeletal: Positive for back pain.  Neurological: Positive for headaches.  Psychiatric/Behavioral: The patient is nervous/anxious.     Vital Signs: BP 139/59 mmHg  Pulse 79  Temp(Src) 99.2 F (37.3 C) (Oral)  Resp 18  SpO2  95%  Physical Exam  Constitutional: She is oriented to person, place, and time. She appears well-developed and well-nourished.  Cardiovascular: Normal rate and regular rhythm.   Pulmonary/Chest: Effort normal and breath sounds normal.  Abdominal: Soft. Bowel sounds are normal. There is no tenderness.  Musculoskeletal: She exhibits edema.  Neurological: She is alert and oriented to person, place, and time.  Skin:  Ulcer rt coccygeal region    Mallampati Score:     Imaging: Dg Lumbar Spine 2-3 Views  10/26/2014   Diagnostic report  3 views lumbar spine  Patient complaint of right hip pain  The patient has a severe amount of osteopenia. She has some  spondylolisthesis. She has some degenerative disc changes.  No fracture or tumor is seen although x-rays are obscured by gas pattern  in the bowel  Impression no acute process but chronic spondylosis  Ct Lumbar Spine Wo Contrast  11/08/2014   CLINICAL DATA:  Low back pain for 3 weeks. Two falls in 1 day. Severe right hip and right leg pain. Personal history of stage IV non-small cell lung cancer. Prior chemotherapy. No current therapy.  EXAM: CT LUMBAR SPINE WITHOUT CONTRAST  TECHNIQUE: Multidetector CT imaging of the lumbar spine was performed without intravenous contrast administration. Multiplanar CT image reconstructions were also generated.  COMPARISON:  Lumbar spine radiographs 10/25/2014. CT abdomen and pelvis 06/23/2013  FINDINGS: Vertebral alignment is unchanged with grade 1 anterolisthesis of L3 on L4 and L4 on L5 and grade 1 retrolisthesis of L5 on S1. There is slight lumbar levoscoliosis. Disc space narrowing is mild at L4-5 and moderate to severe at L5-S1 and with associated vacuum disc phenomenon.  There is a new, large right-sided lumbosacral paravertebral soft tissue mass which measures approximately 6.9 x 7.1 x 10.5 cm. This invades the lateral aspects of the L5 greater than L4 vertebral bodies. There is involvement of the right L5  pedicle and transverse process, and there is also prominent involvement of the right sacral ala and right S1 vertebral body. The mass displaces the right psoas muscle laterally and encases the right internal iliac artery as well as right common, internal and external iliac veins. It also partially encircles the lower most IVC. There is a pathologic compression fracture of the right half of the L5 vertebral body with mild vertebral body height loss. Tumor extends into the superior aspect of the right L5-S1 neural foramen.  Extensive atherosclerotic vascular calcification is present in the abdomen and pelvis. The kidneys are atrophic with similar appearance of small hypodense renal lesions most likely representing cysts. Cholecystectomy clips are noted.  L1-2: Mild disc bulging without significant stenosis.  L2-3: Mild disc bulging and mild facet and ligamentum flavum hypertrophy result in mild right lateral recess stenosis and mild right and minimal left neural foraminal stenosis without significant  spinal stenosis.  L3-4: Listhesis with disc uncovering, ligamentum flavum hypertrophy, and severe bilateral facet arthrosis result in mild to moderate right and minimal left neural foraminal narrowing without significant spinal stenosis.  L4-5: Listhesis with disc uncovering and moderate facet arthrosis without significant stenosis. The right L4 nerve root is likely involved by tumor lateral to the foramen.  L5-S1: Listhesis with disc uncovering and mild facet arthrosis without significant stenosis. The right L5 nerve root is likely involved by tumor lateral to the foramen.  IMPRESSION: New 11 cm destructive right-sided paravertebral soft tissue mass at the lumbosacral junction, consistent with metastasis. There is osseous involvement at L4, L5, and S1 with pathologic L5 compression fracture. Right-sided iliac arterial and venous encasement as above.  These results were called by telephone at the time of interpretation on  11/08/2014 at 4:01 pm to Dr. Arther Abbott , who verbally acknowledged these results.   Electronically Signed   By: Logan Bores   On: 11/08/2014 16:05   US Venous Img Lower Unilateral Right  11/08/2014   CLINICAL DATA:  Right lower extremity pain and edema. History of smoking. History of lung cancer. Evaluate for DVT.  EXAM: RIGHT LOWER EXTREMITY VENOUS DOPPLER ULTRASOUND  TECHNIQUE: Gray-scale sonography with graded compression, as well as color Doppler and duplex ultrasound were performed to evaluate the lower extremity deep venous systems from the level of the common femoral vein and including the common femoral, femoral, profunda femoral, popliteal and calf veins including the posterior tibial, peroneal and gastrocnemius veins when visible. The superficial great saphenous vein was also interrogated. Spectral Doppler was utilized to evaluate flow at rest and with distal augmentation maneuvers in the common femoral, femoral and popliteal veins.  COMPARISON:  None.  FINDINGS: Contralateral Common Femoral Vein: Respiratory phasicity is normal and symmetric with the symptomatic side. No evidence of thrombus. Normal compressibility.  Common Femoral Vein: No evidence of thrombus. Normal compressibility, respiratory phasicity and response to augmentation.  Saphenofemoral Junction: No evidence of thrombus. Normal compressibility and flow on color Doppler imaging.  Profunda Femoral Vein: No evidence of thrombus. Normal compressibility and flow on color Doppler imaging.  Femoral Vein: No evidence of thrombus. Normal compressibility, respiratory phasicity and response to augmentation.  Popliteal Vein: No evidence of thrombus. Normal compressibility, respiratory phasicity and response to augmentation.  Calf Veins: No evidence of thrombus. Normal compressibility and flow on color Doppler imaging.  Superficial Great Saphenous Vein: No evidence of thrombus. Normal compressibility and flow on color Doppler imaging.  Venous  Reflux:  None.  Other Findings: Subcutaneous edema is noted at the level of the calf and ankle (representative images 38, 39 and 40).  IMPRESSION: No evidence of DVT within the right lower extremity.   Electronically Signed   By: Sandi Mariscal M.D.   On: 11/08/2014 15:41   Dg Hip Unilat With Pelvis 1v Right  10/26/2014   X-rays report right hip with pelvis  Patient complaint of right hip pain  Right hip shows no fracture dislocation avascular necrosis changes or  arthritic changes  Impression normal right hip   Labs:  CBC:  Recent Labs  06/27/14 1400 07/28/14 2236 11/15/14 1445 11/23/14 0730  WBC 6.3 7.4 10.6* 18.5*  HGB 12.6 12.0 10.4* 10.2*  HCT 39.6 37.4 31.5* 32.7*  PLT 257 249 375 336    COAGS:  Recent Labs  11/23/14 0730  INR 1.22  APTT 36    BMP:  Recent Labs  02/01/14 1545 05/16/14 1459 06/27/14 1400 07/28/14 2236  11/15/14 1445  NA 138 138 139 135 137  K 4.0 3.2* 3.8 3.5 3.9  CL 100 99  --  101  --   CO2 '27 30 28 27 24  '$ GLUCOSE 172* 106* 105 115* 125  BUN 35* 40* 25.8 35* 54.2*  CALCIUM 8.9 9.3 8.9 8.5 9.5  CREATININE 2.09* 2.4* 2.3* 2.20* 2.8*  GFRNONAA  --   --   --  20*  --   GFRAA  --   --   --  23*  --     LIVER FUNCTION TESTS:  Recent Labs  12/27/13 1335 06/27/14 1400 11/15/14 1445  BILITOT 0.37 0.51 0.68  AST '18 13 21  '$ ALT '13 7 12  '$ ALKPHOS 106 125 137  PROT 7.0 7.2 7.5  ALBUMIN 3.3* 3.3* 2.9*    TUMOR MARKERS: No results for input(s): AFPTM, CEA, CA199, CHROMGRNA in the last 8760 hours.  Assessment and Plan: [name] is a 81 y.o. female with history of stage IV non-small cell lung cancer initially diagnosed in March 2007, status post systemic chemotherapy. She  recently began to have low back, right hip and leg pain with follow up CT scan revealing a new 11 cm destructive right-sided paravertebral soft tissue mass of the lumbosacral junction, concerning for metastasis. She presents today for CT-guided biopsy of the  paravertebral soft tissue mass.Risks and benefits discussed with the patient/family including, but not limited to bleeding, infection, damage to adjacent structures or low yield requiring additional tests.All of the patient's questions were answered, patient is agreeable to proceed.Consent signed and in chart. Due to her low-grade temperature elevation and elevated WBC as well as back pain will obtain UA with culture as well.      Signed: D. Rowe Robert 11/23/2014, 8:13 AM   I spent a total of 20 minutes  in face to face in clinical consultation, greater than 50% of which was counseling/coordinating care for CT-guided lumbosacral paravertebral soft tissue mass biopsy

## 2014-11-23 NOTE — ED Notes (Signed)
Pt states she got a biopsy of a mass on her lower spine earlier today. Daughter states that since the procedure her fever spiked to 102.9 around 7:15 pm. They state they haven't given her anything for it. Family states prior to this pt had been having signs of a UTI with confusion and burning with urination. Pt denies any pain, vomiting, diarrhea. States she does feel mildly nauseous

## 2014-11-23 NOTE — Discharge Instructions (Signed)
Fine Needle Aspiration Fine needle aspiration is a procedure used to remove a piece of tissue. The tissue may be removed from a swelling or abnormal growth (tumor). It is also used to confirm a cyst. A cyst is a fluid filled sac. The procedure may be done by your caregiver or a specialist. LET YOUR CAREGIVER KNOW ABOUT:   Any medications you take, especially blood thinners like aspirin.  Any problems you may have had with similar procedures in the past. RISKS AND COMPLICATIONS This is a safe procedure. There is a very small risk of infection and or bleeding. Other complications can occur if the aspiration site is deep in the body. Your caregiver or specialist will explain this to you.  BEFORE THE PROCEDURE   No special preparation is needed in most cases. Your caregiver will let you know if there are special requirements, such as an empty stomach.  Be sure to ask your caregiver any questions before the procedure starts. PROCEDURE   This procedure is done under local or no anesthetic.  The skin is cleaned carefully.  A thin needle is directed into a lump. The needle is directed in several different directions into the lump while suction is applied to the needle. When samples are removed, the needle is withdrawn.  The contents obtained are placed on a slide. They are then fixed, stained and examined under the microscope. The slide is examined by a specialist in the examination of tissue (pathologist).  A diagnosis can then be made. The pathologist will decide if the specimen is cancerous (malignant) or not cancerous (benign). If fluid is taken from a cyst, cells from the fluid can be examined. If no material is obtained from a fine needle aspiration, the sample may not rule out a problem. Sometimes the procedure is done again. The pathologist may need several days before a result is available. AFTER THE PROCEDURE   There are usually no limits on diet or activity after a fine needle  aspiration.  Your caregiver may give you instructions regarding the aspiration site. This will include information about keeping the site clean and dry. Follow these instructions carefully.  Call for your test results as instructed by your caregiver. Remember, it is your responsibility to get the results of your testing. Do not think everything is fine if you have not heard from your caregiver.  Keep a close watch on the aspiration site. Report any redness, swelling or drainage.  You should not have much pain at the aspiration site.  Take any medications as told by your caregiver. SEEK MEDICAL CARE IF:   You have pain or drainage at the aspiration site that does not go away.  Swelling at the aspiration site does not gradually go away. SEEK IMMEDIATE MEDICAL CARE IF:   You develop a fever a day or two after the aspiration.  You develop severe pain at the aspiration site.  You develop a warm, tender swelling at the aspiration site. Document Released: 05/17/2000 Document Revised: 08/12/2011 Document Reviewed: 01/29/2008 [facility] Patient Information 2015 [place], [place]. This information is not intended to replace advice given to you by your health care provider. Make sure you discuss any questions you have with your health care provider. Conscious Sedation, Adult, Care After Refer to this sheet in the next few weeks. These instructions provide you with information on caring for yourself after your procedure. Your health care provider may also give you more specific instructions. Your treatment has been planned according to current medical  practices, but problems sometimes occur. Call your health care provider if you have any problems or questions after your procedure. WHAT TO EXPECT AFTER THE PROCEDURE  After your procedure:  You may feel sleepy, clumsy, and have poor balance for several hours.  Vomiting may occur if you eat too soon after the procedure. HOME CARE INSTRUCTIONS  Do not  participate in any activities where you could become injured for at least 24 hours. Do not:  Drive.  Swim.  Ride a bicycle.  Operate heavy machinery.  Cook.  Use power tools.  Climb ladders.  Work from a high place.  Do not make important decisions or sign legal documents until you are improved.  If you vomit, drink water, juice, or soup when you can drink without vomiting. Make sure you have little or no nausea before eating solid foods.  Only take over-the-counter or prescription medicines for pain, discomfort, or fever as directed by your health care provider.  Make sure you and your family fully understand everything about the medicines given to you, including what side effects may occur.  You should not drink alcohol, take sleeping pills, or take medicines that cause drowsiness for at least 24 hours.  If you smoke, do not smoke without supervision.  If you are feeling better, you may resume normal activities 24 hours after you were sedated.  Keep all appointments with your health care provider. SEEK MEDICAL CARE IF:  Your skin is pale or bluish in color.  You continue to feel nauseous or vomit.  Your pain is getting worse and is not helped by medicine.  You have bleeding or swelling.  You are still sleepy or feeling clumsy after 24 hours. SEEK IMMEDIATE MEDICAL CARE IF:  You develop a rash.  You have difficulty breathing.  You develop any type of allergic problem.  You have a fever. MAKE SURE YOU:  Understand these instructions.  Will watch your condition.  Will get help right away if you are not doing well or get worse. Document Released: 03/10/2013 Document Reviewed: 03/10/2013 [facility] Patient Information 2015 [place], [place]. This information is not intended to replace advice given to you by your health care provider. Make sure you discuss any questions you have with your health care provider.

## 2014-11-23 NOTE — ED Notes (Addendum)
Pt c/o persistent headache and fever following a spinal biopsy this morning.  She reports that her fever was as high as 102.9 with chills but it is now 99.4 and her pain has resolved.  No dizziness, nausea, vomiting, sweating.  PA at the bedside performing exam; abdominal assessment reveals withdrawal from palpation to RLQ. Pt still denies pain at this time.

## 2014-11-23 NOTE — ED Provider Notes (Signed)
CSN: [id number]     Arrival date & time 11/23/14  2057 History   First MD Initiated Contact with Patient 11/23/14 2132     No chief complaint on file.    (Consider location/radiation/quality/duration/timing/severity/associated sxs/prior Treatment) HPI  [name] is a 81 y.o. female with PMH of hypertension, CK D, CAD presenting with fevers and dysuria, cloudy urine and foul odor. Patient reports urinary discomfort for 1.5 weeks and fever developed today. T max 102.7.  Patient reports intermittent nausea but no vomiting. She endorses looser stool but denies blood or black stool. Patient has taken hydrocodone with improvement. Stage IV non-small cell carcinoma in remission since 2007 until earlier this month when concern for metastatic cancer was found as right paraspinal mass. Patient had biopsy today and noted fevers developed after discharge home. Pt also admits to worsening back pain.    Past Medical History  Diagnosis Date  . Hypertension   . Hypercholesterolemia   . CKD (chronic kidney disease)   . Coronary artery disease     a.  LHC (06/04/05): LHC done in [place] with high grade RCA => s/p BMS to RCA;  b.  Nuclear (09/14/09): Lexiscan; Inf infarct with mild peri-infarct ishemia, EF 52%; Low Risk.  Marland Kitchen GERD (gastroesophageal reflux disease)   . Chronic anemia   . Ischemic cardiomyopathy     a. Echo (07/26/13): Mild LVH, EF 35-40%, diff HK, inf AK, Gr 2 DD, Tr AI, mildly dilated Ao root, MAC, mild MR, mild LAE, mod reduced RVSF.  . Non-small cell carcinoma of lung     Stage IV   Past Surgical History  Procedure Laterality Date  . Hematoma evacuation  December 2006    groin  . Appendectomy    . Cataract extraction    . Hemorroidectomy    . Laminectomy     Family History  Problem Relation Age of Onset  . Heart failure Mother   . Lung cancer Sister   . Lung cancer Brother   . Stomach cancer Brother   . Heart attack Neg Hx   . Stroke Neg Hx    History   Substance Use Topics  . Smoking status: Former Smoker    Types: Cigarettes  . Smokeless tobacco: Not on file  . Alcohol Use: No   OB History    No data available     Review of Systems 10 Systems reviewed and are negative for acute change except as noted in the HPI.    Allergies  Amlodipine; Ciprofloxacin; Statins; Aspirin; Cephalexin; Hydralazine; Iron; Sulfonamide derivatives; and Penicillins  Home Medications   Prior to Admission medications   Medication Sig Start Date End Date Taking? Authorizing Provider  cloNIDine (CATAPRES) 0.2 MG tablet Take 1 tablet (0.2 mg total) by mouth 2 (two) times daily. 05/16/14  Yes Liliane Shi, PA-C  clopidogrel (PLAVIX) 75 MG tablet TAKE ONE TABLET DAILY. 04/06/14  Yes Sherren Mocha, MD  furosemide (LASIX) 40 MG tablet Take 1 tablet (40 mg total) by mouth daily. 02/03/14  Yes Sherren Mocha, MD  HYDROcodone-acetaminophen (NORCO/VICODIN) 5-325 MG per tablet Take 1 tablet by mouth every 6 (six) hours as needed. 11/15/14  Yes Maryanna Shape, NP  loperamide (IMODIUM A-D) 2 MG tablet Take 2 mg by mouth 4 (four) times daily as needed for diarrhea or loose stools (diarrhea).   Yes Historical Provider, MD  metoprolol succinate (TOPROL-XL) 25 MG 24 hr tablet TAKE 1/2 TABLET BY MOUTH AT BEDTIME. 08/26/14  Yes Sherren Mocha, MD  pantoprazole (PROTONIX) 40 MG tablet TAKE ONE TABLET BY MOUTH ONCE DAILY. 10/14/14  Yes Sherren Mocha, MD  LORazepam (ATIVAN) 1 MG tablet Take 1 tablet (1 mg total) by mouth every 8 (eight) hours. 11/16/14   Maryanna Shape, NP   BP 141/57 mmHg  Pulse 93  Temp(Src) 99 F (37.2 C) (Oral)  Resp 18  SpO2 93% Physical Exam  Constitutional: She appears well-developed and well-nourished. No distress.  HENT:  Head: Normocephalic and atraumatic.  Mouth/Throat: Oropharynx is clear and moist.  Eyes: Conjunctivae and EOM are normal. Right eye exhibits no discharge. Left eye exhibits no discharge.  Cardiovascular: Normal rate and  regular rhythm.   Pulmonary/Chest: Effort normal and breath sounds normal. No respiratory distress. She has no wheezes.  Abdominal: Soft. Bowel sounds are normal. She exhibits no distension.  Right sided abdominal tenderness without rebound, rigidity and voluntary guarding.  Musculoskeletal:  2 superficial decubitus lesions without ulceration with surrounding erythema without clear evidence of cellulitis. No CVA tenderness  Neurological: She is alert. She exhibits normal muscle tone. Coordination normal.  Skin: Skin is warm and dry. She is not diaphoretic.  Nursing note and vitals reviewed.   ED Course  Procedures (including critical care time) Labs Review Labs Reviewed  URINALYSIS, ROUTINE W REFLEX MICROSCOPIC (NOT AT [facility]) - Abnormal; Notable for the following:    APPearance TURBID (*)    Hgb urine dipstick LARGE (*)    Protein, ur 100 (*)    Leukocytes, UA LARGE (*)    All other components within normal limits  CBC - Abnormal; Notable for the following:    WBC 18.0 (*)    RBC 3.75 (*)    Hemoglobin 9.8 (*)    HCT 30.6 (*)    All other components within normal limits  COMPREHENSIVE METABOLIC PANEL - Abnormal; Notable for the following:    Sodium 131 (*)    Chloride 95 (*)    CO2 21 (*)    Glucose, Bld 174 (*)    BUN 58 (*)    Creatinine, Ser 3.31 (*)    Calcium 8.5 (*)    Albumin 3.0 (*)    ALT 11 (*)    GFR calc non Af Amer 12 (*)    GFR calc Af Amer 14 (*)    All other components within normal limits  DIFFERENTIAL - Abnormal; Notable for the following:    Neutrophils Relative % 92 (*)    Neutro Abs 16.5 (*)    Lymphocytes Relative 4 (*)    All other components within normal limits  URINE CULTURE  URINE MICROSCOPIC-ADD ON    Imaging Review Ct Abdomen Pelvis Wo Contrast  11/24/2014   CLINICAL DATA:  Acute onset of fever and weakness. Right lower quadrant tenderness. Recent CT-guided biopsy of right paraspinal mass. Initial encounter.  EXAM: CT ABDOMEN AND PELVIS  WITHOUT CONTRAST  TECHNIQUE: Multidetector CT imaging of the abdomen and pelvis was performed following the standard protocol without IV contrast.  COMPARISON:  CT of the abdomen and pelvis performed 06/23/2013, and lumbar spine CT performed 11/08/2014  FINDINGS: Mild bibasilar atelectasis is noted. This is somewhat nodular at the left lung base, measuring 1.0 cm. Diffuse coronary artery calcifications are seen.  The liver and spleen are unremarkable in appearance. The patient is status post cholecystectomy, with clips noted at the gallbladder fossa. The pancreas and adrenal glands are unremarkable.  There is mild right-sided hydronephrosis, with prominence of the proximal to mid right ureter. This appears  to reflect partial encasement of the mid right ureter by the patient's large right paraspinal mass.  Nonspecific perinephric stranding is noted on the right. A few scattered hypodensities and hyperdensities within the kidneys likely reflect cysts. No renal or ureteral stones are identified. There is mild bilateral renal scarring.  No free fluid is identified. The small bowel is unremarkable in appearance. The stomach is within normal limits. No acute vascular abnormalities are seen. Diffuse calcification is seen along the abdominal aorta and its branches.  The patient is status post appendectomy. Scattered diverticulosis is noted at the splenic flexure of the colon, and along the descending and sigmoid colon, without evidence of diverticulitis.  The bladder is mildly distended and grossly unremarkable. The patient is status post hysterectomy. No suspicious adnexal masses are seen. No inguinal lymphadenopathy is seen.  There is a large 12.0 x 8.1 x 6.0 cm mass arising along the right side of the lower lumbar spine, with diffuse erosion into vertebral bodies at L4, L5 and S1, obscuring the exiting nerve roots on the right at these levels. No definite extension into the spinal canal is yet seen. The mass underlies  the right psoas muscle and tracks about the distal IVC and right common iliac vein. There is diffuse encasement of the right internal iliac artery. Mass extends inferiorly medial to the right iliacus muscle.  No additional osseous abnormalities are identified.  IMPRESSION: 1. Large 12.0 x 8.1 x 6.0 cm right paraspinal mass is likely mildly increased in size from recent prior CT, with diffuse erosion into the vertebral bodies at L4, L5 and S1, obscuring the exiting nerve roots on the right at these levels. No definite extension into the spinal canal. Mass underlies the right psoas muscle and tracks about the distal SVC and right common iliac vein. Diffuse encasement of the right internal iliac artery. 2. Mild right-sided hydronephrosis, with prominence of the proximal to mid right ureter. This appears to reflect partial encasement of the mid right ureter by the large right paraspinal mass. 3. Nodule at the left lung base, measuring 1.0 cm, of uncertain significance. This is apparently new from 51. Mild underlying bibasilar atelectasis noted. 4. Few scattered bilateral renal cysts. Mild bilateral renal scarring noted. 5. Diffuse calcification along the abdominal aorta and its branches. 6. Scattered diverticulosis of the splenic flexure of the colon, and along the descending and sigmoid colon, without evidence of diverticulitis.   Electronically Signed   By: Garald Balding M.D.   On: 11/24/2014 00:31   Ct Biopsy  11/23/2014   INDICATION: History of lung cancer, now with infiltrative right-sided paraspinal mass adjacent to the caudal aspect of the lumbar spine extending to involve the lumbar sacral junction. Please perform CT-guided biopsy for tissue diagnostic purposes  EXAM: CT-GUIDED BIOPSY OF INFILTRATIVE RIGHT PARASPINAL MASS.  COMPARISON:  Lumbar spine CT - 11/08/2014  MEDICATIONS: Fentanyl 25 mcg IV; Versed 1 mg IV  ANESTHESIA/SEDATION: Sedation time  8 minutes  CONTRAST:  None  COMPLICATIONS: None immediate   PROCEDURE: Informed consent was obtained from the patient following an explanation of the procedure, risks, benefits and alternatives. A time out was performed prior to the initiation of the procedure.  The patient was positioned prone on the CT table and a limited CT was performed for procedural planning demonstrating unchanged appearance of the note expansile paraspinal mass adjacent to the caudal aspect of the lumbar spine measuring at least 5.6 x 7.3 cm in diameter (image 16, series 2). The procedure was  planned. The operative site was prepped and draped in the usual sterile fashion. Appropriate trajectory was confirmed with a 22 gauge spinal needle after the adjacent tissues were anesthetized with 1% Lidocaine with epinephrine.  Under intermittent CT guidance, a 17 gauge coaxial needle was advanced into the peripheral aspect of the mass. Appropriate positioning was confirmed and 6 core needle biopsy samples were obtained with an 18 gauge core needle biopsy device. The co-axial needle was removed and hemostasis was achieved with manual compression.  A limited postprocedural CT was negative for hemorrhage or additional complication. A dressing was placed. The patient tolerated the procedure well without immediate postprocedural complication.  IMPRESSION: Technically successful CT guided core needle biopsy of expansile right-sided paraspinal mass adjacent to the caudal aspect of the lumbar spine.   Electronically Signed   By: Sandi Mariscal M.D.   On: 11/23/2014 10:13     EKG Interpretation None        MDM   Final diagnoses:  UTI (lower urinary tract infection)  Hydronephrosis, unspecified hydronephrosis type  Paraspinal mass  Non-small cell lung cancer, unspecified laterality   Patient presenting with 1-5 week history of urinary symptoms and developed fever today after biopsy of the spine mass. Patient also has complaining of back pain. MAXIMUM TEMPERATURE 102.7. No fever in ED and patient with  initial tachycardia that has resolved. Patient with right-sided abdominal tenderness as well as right-sided back/flank pain. Leukocytosis of 18 with left shift. Too numerous to count white cells in urine, large leuks. Patient with urinary tract infection. Culture ordered. CT abdomen pelvis to evaluate right-sided abdominal tenderness. Patient with large right paraspinal mass with erosion is vertebral bodies which is causing partial obstruction of right ureter resulting in mild right-sided hydronephrosis. Consult to hospitalist for admission. Spoke with Hal Hope who agrees to evaluate patient with pain for admission.  Discussed all results and patient verbalizes understanding and agrees with plan.  This is a shared patient. This patient was discussed with the physician who saw and evaluated the patient and agrees with the plan.     Al Corpus, PA-C 11/24/14 [place], MD 11/29/14 1425

## 2014-11-23 NOTE — Procedures (Signed)
Technically successful CT guided biopsy of lytic expansile mass about the right side of the lower lumbar spine  No immediate post procedural complications.

## 2014-11-23 NOTE — ED Notes (Signed)
Removed contrast from pt's room per PA Creech.  Will notify radiology staff.

## 2014-11-24 ENCOUNTER — Inpatient Hospital Stay (HOSPITAL_COMMUNITY): Payer: Medicare Other

## 2014-11-24 ENCOUNTER — Encounter (HOSPITAL_COMMUNITY): Payer: Self-pay | Admitting: Internal Medicine

## 2014-11-24 DIAGNOSIS — B964 Proteus (mirabilis) (morganii) as the cause of diseases classified elsewhere: Secondary | ICD-10-CM | POA: Diagnosis not present

## 2014-11-24 DIAGNOSIS — N1330 Unspecified hydronephrosis: Secondary | ICD-10-CM | POA: Diagnosis not present

## 2014-11-24 DIAGNOSIS — Z888 Allergy status to other drugs, medicaments and biological substances status: Secondary | ICD-10-CM | POA: Diagnosis not present

## 2014-11-24 DIAGNOSIS — K219 Gastro-esophageal reflux disease without esophagitis: Secondary | ICD-10-CM | POA: Diagnosis not present

## 2014-11-24 DIAGNOSIS — I5032 Chronic diastolic (congestive) heart failure: Secondary | ICD-10-CM | POA: Diagnosis not present

## 2014-11-24 DIAGNOSIS — Z886 Allergy status to analgesic agent status: Secondary | ICD-10-CM | POA: Diagnosis not present

## 2014-11-24 DIAGNOSIS — E780 Pure hypercholesterolemia: Secondary | ICD-10-CM | POA: Diagnosis not present

## 2014-11-24 DIAGNOSIS — N131 Hydronephrosis with ureteral stricture, not elsewhere classified: Secondary | ICD-10-CM | POA: Diagnosis not present

## 2014-11-24 DIAGNOSIS — L500 Allergic urticaria: Secondary | ICD-10-CM | POA: Diagnosis not present

## 2014-11-24 DIAGNOSIS — I82411 Acute embolism and thrombosis of right femoral vein: Secondary | ICD-10-CM | POA: Diagnosis not present

## 2014-11-24 DIAGNOSIS — R609 Edema, unspecified: Secondary | ICD-10-CM | POA: Diagnosis not present

## 2014-11-24 DIAGNOSIS — R7881 Bacteremia: Secondary | ICD-10-CM | POA: Diagnosis not present

## 2014-11-24 DIAGNOSIS — Z9221 Personal history of antineoplastic chemotherapy: Secondary | ICD-10-CM | POA: Diagnosis not present

## 2014-11-24 DIAGNOSIS — R509 Fever, unspecified: Secondary | ICD-10-CM | POA: Diagnosis present

## 2014-11-24 DIAGNOSIS — M21371 Foot drop, right foot: Secondary | ICD-10-CM | POA: Diagnosis not present

## 2014-11-24 DIAGNOSIS — I129 Hypertensive chronic kidney disease with stage 1 through stage 4 chronic kidney disease, or unspecified chronic kidney disease: Secondary | ICD-10-CM | POA: Diagnosis not present

## 2014-11-24 DIAGNOSIS — R222 Localized swelling, mass and lump, trunk: Secondary | ICD-10-CM | POA: Diagnosis not present

## 2014-11-24 DIAGNOSIS — N390 Urinary tract infection, site not specified: Secondary | ICD-10-CM | POA: Diagnosis not present

## 2014-11-24 DIAGNOSIS — R29818 Other symptoms and signs involving the nervous system: Secondary | ICD-10-CM | POA: Diagnosis not present

## 2014-11-24 DIAGNOSIS — D630 Anemia in neoplastic disease: Secondary | ICD-10-CM | POA: Diagnosis not present

## 2014-11-24 DIAGNOSIS — L8990 Pressure ulcer of unspecified site, unspecified stage: Secondary | ICD-10-CM | POA: Insufficient documentation

## 2014-11-24 DIAGNOSIS — Z882 Allergy status to sulfonamides status: Secondary | ICD-10-CM | POA: Diagnosis not present

## 2014-11-24 DIAGNOSIS — M7989 Other specified soft tissue disorders: Secondary | ICD-10-CM | POA: Diagnosis not present

## 2014-11-24 DIAGNOSIS — Z880 Allergy status to penicillin: Secondary | ICD-10-CM | POA: Diagnosis not present

## 2014-11-24 DIAGNOSIS — N179 Acute kidney failure, unspecified: Secondary | ICD-10-CM | POA: Diagnosis not present

## 2014-11-24 DIAGNOSIS — Z87891 Personal history of nicotine dependence: Secondary | ICD-10-CM | POA: Diagnosis not present

## 2014-11-24 DIAGNOSIS — A419 Sepsis, unspecified organism: Secondary | ICD-10-CM | POA: Diagnosis present

## 2014-11-24 DIAGNOSIS — N184 Chronic kidney disease, stage 4 (severe): Secondary | ICD-10-CM | POA: Diagnosis not present

## 2014-11-24 DIAGNOSIS — D631 Anemia in chronic kidney disease: Secondary | ICD-10-CM | POA: Diagnosis not present

## 2014-11-24 DIAGNOSIS — I82412 Acute embolism and thrombosis of left femoral vein: Secondary | ICD-10-CM | POA: Diagnosis not present

## 2014-11-24 DIAGNOSIS — C3490 Malignant neoplasm of unspecified part of unspecified bronchus or lung: Secondary | ICD-10-CM | POA: Diagnosis not present

## 2014-11-24 DIAGNOSIS — C763 Malignant neoplasm of pelvis: Secondary | ICD-10-CM | POA: Diagnosis not present

## 2014-11-24 DIAGNOSIS — I2510 Atherosclerotic heart disease of native coronary artery without angina pectoris: Secondary | ICD-10-CM | POA: Diagnosis not present

## 2014-11-24 LAB — LACTIC ACID, PLASMA
Lactic Acid, Venous: 0.8 mmol/L (ref 0.5–2.0)
Lactic Acid, Venous: 0.8 mmol/L (ref 0.5–2.0)

## 2014-11-24 LAB — COMPREHENSIVE METABOLIC PANEL
ALT: 10 U/L — AB (ref 14–54)
AST: 21 U/L (ref 15–41)
Albumin: 2.5 g/dL — ABNORMAL LOW (ref 3.5–5.0)
Alkaline Phosphatase: 103 U/L (ref 38–126)
Anion gap: 16 — ABNORMAL HIGH (ref 5–15)
BUN: 55 mg/dL — AB (ref 6–20)
CO2: 20 mmol/L — AB (ref 22–32)
Calcium: 8.4 mg/dL — ABNORMAL LOW (ref 8.9–10.3)
Chloride: 96 mmol/L — ABNORMAL LOW (ref 101–111)
Creatinine, Ser: 3.09 mg/dL — ABNORMAL HIGH (ref 0.44–1.00)
GFR calc Af Amer: 15 mL/min — ABNORMAL LOW (ref >60)
GFR, EST NON AFRICAN AMERICAN: 13 mL/min — AB (ref >60)
Glucose, Bld: 121 mg/dL — ABNORMAL HIGH (ref 65–99)
Potassium: 4.1 mmol/L (ref 3.5–5.1)
SODIUM: 132 mmol/L — AB (ref 135–145)
Total Bilirubin: 0.7 mg/dL (ref 0.3–1.2)
Total Protein: 6.4 g/dL — ABNORMAL LOW (ref 6.5–8.1)

## 2014-11-24 LAB — CBC WITH DIFFERENTIAL/PLATELET
Basophils Absolute: 0 10*3/uL (ref 0.0–0.1)
Basophils Relative: 0 % (ref 0–1)
EOS ABS: 0 10*3/uL (ref 0.0–0.7)
Eosinophils Relative: 0 % (ref 0–5)
HCT: 29.1 % — ABNORMAL LOW (ref 36.0–46.0)
HEMOGLOBIN: 9.2 g/dL — AB (ref 12.0–15.0)
Lymphocytes Relative: 5 % — ABNORMAL LOW (ref 12–46)
Lymphs Abs: 0.7 10*3/uL (ref 0.7–4.0)
MCH: 25.8 pg — AB (ref 26.0–34.0)
MCHC: 31.6 g/dL (ref 30.0–36.0)
MCV: 81.5 fL (ref 78.0–100.0)
Monocytes Absolute: 0.9 10*3/uL (ref 0.1–1.0)
Monocytes Relative: 6 % (ref 3–12)
Neutro Abs: 12.6 10*3/uL — ABNORMAL HIGH (ref 1.7–7.7)
Neutrophils Relative %: 89 % — ABNORMAL HIGH (ref 43–77)
PLATELETS: 278 10*3/uL (ref 150–400)
RBC: 3.57 MIL/uL — ABNORMAL LOW (ref 3.87–5.11)
RDW: 14.3 % (ref 11.5–15.5)
WBC: 14.3 10*3/uL — ABNORMAL HIGH (ref 4.0–10.5)

## 2014-11-24 LAB — GLUCOSE, CAPILLARY: GLUCOSE-CAPILLARY: 123 mg/dL — AB (ref 65–99)

## 2014-11-24 LAB — URINALYSIS, ROUTINE W REFLEX MICROSCOPIC
Bilirubin Urine: NEGATIVE
Glucose, UA: NEGATIVE mg/dL
Ketones, ur: NEGATIVE mg/dL
Nitrite: NEGATIVE
Protein, ur: 100 mg/dL — AB
SPECIFIC GRAVITY, URINE: 1.01 (ref 1.005–1.030)
Urobilinogen, UA: 1 mg/dL (ref 0.0–1.0)
pH: 6 (ref 5.0–8.0)

## 2014-11-24 LAB — URINE MICROSCOPIC-ADD ON

## 2014-11-24 LAB — PROCALCITONIN: PROCALCITONIN: 35.36 ng/mL

## 2014-11-24 LAB — CG4 I-STAT (LACTIC ACID): LACTIC ACID, VENOUS: 2.21 mmol/L — AB (ref 0.5–2.0)

## 2014-11-24 LAB — APTT: APTT: 35 s (ref 24–37)

## 2014-11-24 LAB — PROTIME-INR
INR: 1.35 (ref 0.00–1.49)
Prothrombin Time: 16.8 seconds — ABNORMAL HIGH (ref 11.6–15.2)

## 2014-11-24 MED ORDER — LORAZEPAM 0.5 MG PO TABS
0.5000 mg | ORAL_TABLET | Freq: Four times a day (QID) | ORAL | Status: DC | PRN
  Administered 2014-11-26 – 2014-11-29 (×8): 0.5 mg via ORAL
  Filled 2014-11-24 (×8): qty 1

## 2014-11-24 MED ORDER — VANCOMYCIN HCL IN DEXTROSE 1-5 GM/200ML-% IV SOLN
1000.0000 mg | INTRAVENOUS | Status: DC
  Administered 2014-11-24: 1000 mg via INTRAVENOUS
  Filled 2014-11-24: qty 200

## 2014-11-24 MED ORDER — PANTOPRAZOLE SODIUM 40 MG PO TBEC
40.0000 mg | DELAYED_RELEASE_TABLET | Freq: Every day | ORAL | Status: DC
  Administered 2014-11-24 – 2014-11-29 (×6): 40 mg via ORAL
  Filled 2014-11-24 (×6): qty 1

## 2014-11-24 MED ORDER — CLOPIDOGREL BISULFATE 75 MG PO TABS
75.0000 mg | ORAL_TABLET | Freq: Every day | ORAL | Status: DC
  Filled 2014-11-24: qty 1

## 2014-11-24 MED ORDER — PNEUMOCOCCAL VAC POLYVALENT 25 MCG/0.5ML IJ INJ
0.5000 mL | INJECTION | INTRAMUSCULAR | Status: AC
  Administered 2014-11-26: 0.5 mL via INTRAMUSCULAR
  Filled 2014-11-24 (×3): qty 0.5

## 2014-11-24 MED ORDER — MIDAZOLAM HCL 2 MG/2ML IJ SOLN
INTRAMUSCULAR | Status: AC
  Filled 2014-11-24: qty 4

## 2014-11-24 MED ORDER — ACETAMINOPHEN 650 MG RE SUPP: 650.0000 mg | Freq: Four times a day (QID) | RECTAL | Status: DC | PRN

## 2014-11-24 MED ORDER — SODIUM CHLORIDE 0.9 % IV SOLN
INTRAVENOUS | Status: AC
  Administered 2014-11-24: 05:00:00 via INTRAVENOUS

## 2014-11-24 MED ORDER — LIDOCAINE HCL 1 % IJ SOLN
INTRAMUSCULAR | Status: AC
  Filled 2014-11-24: qty 20

## 2014-11-24 MED ORDER — HEPARIN (PORCINE) IN NACL 100-0.45 UNIT/ML-% IJ SOLN
1050.0000 [IU]/h | INTRAMUSCULAR | Status: DC
  Administered 2014-11-24: 900 [IU]/h via INTRAVENOUS
  Filled 2014-11-24 (×4): qty 250

## 2014-11-24 MED ORDER — ACETAMINOPHEN 325 MG PO TABS
650.0000 mg | ORAL_TABLET | Freq: Four times a day (QID) | ORAL | Status: DC | PRN
  Administered 2014-11-25 – 2014-11-29 (×12): 650 mg via ORAL
  Filled 2014-11-24 (×12): qty 2

## 2014-11-24 MED ORDER — FENTANYL CITRATE (PF) 100 MCG/2ML IJ SOLN
INTRAMUSCULAR | Status: AC
  Filled 2014-11-24: qty 2

## 2014-11-24 MED ORDER — MORPHINE SULFATE 2 MG/ML IJ SOLN
1.0000 mg | INTRAMUSCULAR | Status: DC | PRN
  Administered 2014-11-27 (×2): 1 mg via INTRAVENOUS
  Filled 2014-11-24 (×3): qty 1

## 2014-11-24 MED ORDER — SODIUM CHLORIDE 0.9 % IV SOLN
250.0000 mg | Freq: Two times a day (BID) | INTRAVENOUS | Status: DC
  Administered 2014-11-24 – 2014-11-29 (×10): 250 mg via INTRAVENOUS
  Filled 2014-11-24 (×12): qty 250

## 2014-11-24 MED ORDER — CLONIDINE HCL 0.1 MG PO TABS
0.1000 mg | ORAL_TABLET | Freq: Two times a day (BID) | ORAL | Status: DC
  Administered 2014-11-24 – 2014-11-26 (×5): 0.1 mg via ORAL
  Filled 2014-11-24 (×8): qty 1

## 2014-11-24 MED ORDER — MIDAZOLAM HCL 2 MG/2ML IJ SOLN
INTRAMUSCULAR | Status: AC | PRN
  Administered 2014-11-24: 0.5 mg via INTRAVENOUS

## 2014-11-24 MED ORDER — LORAZEPAM 1 MG PO TABS: 1.0000 mg | ORAL_TABLET | Freq: Three times a day (TID) | ORAL | Status: DC

## 2014-11-24 MED ORDER — SODIUM CHLORIDE 0.9 % IV BOLUS (SEPSIS)
500.0000 mL | Freq: Once | INTRAVENOUS | Status: AC
  Administered 2014-11-24: 500 mL via INTRAVENOUS

## 2014-11-24 MED ORDER — CLONIDINE HCL 0.2 MG PO TABS
0.2000 mg | ORAL_TABLET | Freq: Two times a day (BID) | ORAL | Status: DC
  Administered 2014-11-24: 0.2 mg via ORAL
  Filled 2014-11-24 (×2): qty 1

## 2014-11-24 MED ORDER — ONDANSETRON HCL 4 MG PO TABS: 4.0000 mg | ORAL_TABLET | Freq: Four times a day (QID) | ORAL | Status: DC | PRN

## 2014-11-24 MED ORDER — FENTANYL CITRATE (PF) 100 MCG/2ML IJ SOLN
INTRAMUSCULAR | Status: AC | PRN
  Administered 2014-11-24: 25 ug via INTRAVENOUS

## 2014-11-24 MED ORDER — IOHEXOL 300 MG/ML  SOLN
10.0000 mL | Freq: Once | INTRAMUSCULAR | Status: AC | PRN
  Administered 2014-11-24: 10 mL

## 2014-11-24 MED ORDER — ONDANSETRON HCL 4 MG/2ML IJ SOLN
4.0000 mg | Freq: Four times a day (QID) | INTRAMUSCULAR | Status: DC | PRN
  Administered 2014-11-25: 4 mg via INTRAVENOUS
  Filled 2014-11-24: qty 2

## 2014-11-24 MED ORDER — IMIPENEM-CILASTATIN 500 MG IV SOLR
500.0000 mg | Freq: Once | INTRAVENOUS | Status: AC
  Administered 2014-11-24: 500 mg via INTRAVENOUS
  Filled 2014-11-24: qty 500

## 2014-11-24 MED ORDER — METOPROLOL SUCCINATE 12.5 MG HALF TABLET
12.5000 mg | ORAL_TABLET | Freq: Every day | ORAL | Status: DC
  Administered 2014-11-24 – 2014-11-26 (×3): 12.5 mg via ORAL
  Filled 2014-11-24 (×4): qty 1

## 2014-11-24 NOTE — Progress Notes (Signed)
Per Dr. Broadus John, patient does not need a bolus at this time.

## 2014-11-24 NOTE — ED Notes (Addendum)
Notified RN,Kellee pt. i-stat CG4 Lactic Acid results 2.21.

## 2014-11-24 NOTE — H&P (Signed)
[facility] History and Physical  [name] TWS:[id number] DOB: July 13, 1933 DOA: 11/23/2014  Referring physician: Ms.Victoria. PCP: Delphina Cahill, MD  Specialists: Dr.Mohammed. Oncologist.  Chief Complaint: Fever chills.  HPI: [name] is a 81 y.o. female with history of stage IV lung cancer who was recently diagnosed with a right paraspinal mass and had undergone biopsy yesterday following which patient was discharged home and patient was experiencing fever chills. Since the fever chills persisted patient came back to the ER. Patient also is complaining of some right sided flank pain. CT abdomen and pelvis shows mild hydronephrosis with tumor encasing the ureter. UA was showing features consistent with UTI. Patient has been admitted for further management of UTI with possible developing sepsis. Patient also states that she has been having some diarrhea 2 days ago. Denies any chest pain shortness of breath or any productive cough. Patient has been having increasing right lower extremity edema.   Review of Systems: As presented in the history of presenting illness, rest negative.  Past Medical History  Diagnosis Date  . Hypertension   . Hypercholesterolemia   . CKD (chronic kidney disease)   . Coronary artery disease     a.  LHC (06/04/05): LHC done in [place] with high grade RCA => s/p BMS to RCA;  b.  Nuclear (09/14/09): Lexiscan; Inf infarct with mild peri-infarct ishemia, EF 52%; Low Risk.  Marland Kitchen GERD (gastroesophageal reflux disease)   . Chronic anemia   . Ischemic cardiomyopathy     a. Echo (07/26/13): Mild LVH, EF 35-40%, diff HK, inf AK, Gr 2 DD, Tr AI, mildly dilated Ao root, MAC, mild MR, mild LAE, mod reduced RVSF.  . Non-small cell carcinoma of lung     Stage IV   Past Surgical History  Procedure Laterality Date  . Hematoma evacuation  December 2006    groin  . Appendectomy    . Cataract extraction    . Hemorroidectomy    . Laminectomy     Social  History:  reports that she has quit smoking. Her smoking use included Cigarettes. She does not have any smokeless tobacco history on file. She reports that she does not drink alcohol. Her drug history is not on file. Where does patient live home. Can patient participate in ADLs? Yes.  Allergies  Allergen Reactions  . Amlodipine Swelling  . Ciprofloxacin Hives and Other (See Comments)    REACTION: weakness  . Statins Other (See Comments)  . Aspirin Swelling    Mouth swelling, tongue  . Cephalexin Hives  . Hydralazine Nausea And Vomiting  . Iron Nausea And Vomiting  . Sulfonamide Derivatives Hives  . Penicillins Other (See Comments)    Reaction is unknown    Family History:  Family History  Problem Relation Age of Onset  . Heart failure Mother   . Lung cancer Sister   . Lung cancer Brother   . Stomach cancer Brother   . Heart attack Neg Hx   . Stroke Neg Hx       Prior to Admission medications   Medication Sig Start Date End Date Taking? Authorizing Provider  cloNIDine (CATAPRES) 0.2 MG tablet Take 1 tablet (0.2 mg total) by mouth 2 (two) times daily. 05/16/14  Yes Liliane Shi, PA-C  clopidogrel (PLAVIX) 75 MG tablet TAKE ONE TABLET DAILY. 04/06/14  Yes Sherren Mocha, MD  furosemide (LASIX) 40 MG tablet Take 1 tablet (40 mg total) by mouth daily. 02/03/14  Yes Sherren Mocha, MD  HYDROcodone-acetaminophen (  NORCO/VICODIN) 5-325 MG per tablet Take 1 tablet by mouth every 6 (six) hours as needed. 11/15/14  Yes Maryanna Shape, NP  loperamide (IMODIUM A-D) 2 MG tablet Take 2 mg by mouth 4 (four) times daily as needed for diarrhea or loose stools (diarrhea).   Yes Historical Provider, MD  metoprolol succinate (TOPROL-XL) 25 MG 24 hr tablet TAKE 1/2 TABLET BY MOUTH AT BEDTIME. 08/26/14  Yes Sherren Mocha, MD  pantoprazole (PROTONIX) 40 MG tablet TAKE ONE TABLET BY MOUTH ONCE DAILY. 10/14/14  Yes Sherren Mocha, MD  LORazepam (ATIVAN) 1 MG tablet Take 1 tablet (1 mg total) by mouth  every 8 (eight) hours. 11/16/14   Maryanna Shape, NP    Physical Exam: Filed Vitals:   11/23/14 2105 11/23/14 2108 11/23/14 2339  BP: 127/54 127/54 141/57  Pulse: 109 107 93  Temp: 99 F (37.2 C) 99 F (37.2 C)   TempSrc: Oral Oral   Resp: '18 18 18  '$ SpO2: 94% 95% 93%     General:  Moderately built and nourished.  Eyes: Anicteric no pallor.  ENT: No discharge from ears eyes nose and mouth.  Neck: No mass felt.  Cardiovascular: S1 and S2 heard.  Respiratory: No rhonchi or crepitations.  Abdomen: Soft nontender bowel sounds present.  Skin: No rash.  Musculoskeletal: Right lower extremity edema.  Psychiatric: Appears normal.  Neurologic: Alert awake oriented to time place and person. Moves all extremities.  Labs on Admission:  Basic Metabolic Panel:  Recent Labs Lab 11/23/14 2148  NA 131*  K 3.9  CL 95*  CO2 21*  GLUCOSE 174*  BUN 58*  CREATININE 3.31*  CALCIUM 8.5*   Liver Function Tests:  Recent Labs Lab 11/23/14 2148  AST 22  ALT 11*  ALKPHOS 117  BILITOT 0.6  PROT 7.3  ALBUMIN 3.0*   No results for input(s): LIPASE, AMYLASE in the last 168 hours. No results for input(s): AMMONIA in the last 168 hours. CBC:  Recent Labs Lab 11/23/14 0730 11/23/14 2148  WBC 18.5* 18.0*  NEUTROABS  --  16.5*  HGB 10.2* 9.8*  HCT 32.7* 30.6*  MCV 82.2 81.6  PLT 336 323   Cardiac Enzymes: No results for input(s): CKTOTAL, CKMB, CKMBINDEX, TROPONINI in the last 168 hours.  BNP (last 3 results) No results for input(s): BNP in the last 8760 hours.  ProBNP (last 3 results) No results for input(s): PROBNP in the last 8760 hours.  CBG: No results for input(s): GLUCAP in the last 168 hours.  Radiological Exams on Admission: Ct Abdomen Pelvis Wo Contrast  11/24/2014   CLINICAL DATA:  Acute onset of fever and weakness. Right lower quadrant tenderness. Recent CT-guided biopsy of right paraspinal mass. Initial encounter.  EXAM: CT ABDOMEN AND PELVIS  WITHOUT CONTRAST  TECHNIQUE: Multidetector CT imaging of the abdomen and pelvis was performed following the standard protocol without IV contrast.  COMPARISON:  CT of the abdomen and pelvis performed 06/23/2013, and lumbar spine CT performed 11/08/2014  FINDINGS: Mild bibasilar atelectasis is noted. This is somewhat nodular at the left lung base, measuring 1.0 cm. Diffuse coronary artery calcifications are seen.  The liver and spleen are unremarkable in appearance. The patient is status post cholecystectomy, with clips noted at the gallbladder fossa. The pancreas and adrenal glands are unremarkable.  There is mild right-sided hydronephrosis, with prominence of the proximal to mid right ureter. This appears to reflect partial encasement of the mid right ureter by the patient's large right paraspinal mass.  Nonspecific perinephric stranding is noted on the right. A few scattered hypodensities and hyperdensities within the kidneys likely reflect cysts. No renal or ureteral stones are identified. There is mild bilateral renal scarring.  No free fluid is identified. The small bowel is unremarkable in appearance. The stomach is within normal limits. No acute vascular abnormalities are seen. Diffuse calcification is seen along the abdominal aorta and its branches.  The patient is status post appendectomy. Scattered diverticulosis is noted at the splenic flexure of the colon, and along the descending and sigmoid colon, without evidence of diverticulitis.  The bladder is mildly distended and grossly unremarkable. The patient is status post hysterectomy. No suspicious adnexal masses are seen. No inguinal lymphadenopathy is seen.  There is a large 12.0 x 8.1 x 6.0 cm mass arising along the right side of the lower lumbar spine, with diffuse erosion into vertebral bodies at L4, L5 and S1, obscuring the exiting nerve roots on the right at these levels. No definite extension into the spinal canal is yet seen. The mass underlies  the right psoas muscle and tracks about the distal IVC and right common iliac vein. There is diffuse encasement of the right internal iliac artery. Mass extends inferiorly medial to the right iliacus muscle.  No additional osseous abnormalities are identified.  IMPRESSION: 1. Large 12.0 x 8.1 x 6.0 cm right paraspinal mass is likely mildly increased in size from recent prior CT, with diffuse erosion into the vertebral bodies at L4, L5 and S1, obscuring the exiting nerve roots on the right at these levels. No definite extension into the spinal canal. Mass underlies the right psoas muscle and tracks about the distal SVC and right common iliac vein. Diffuse encasement of the right internal iliac artery. 2. Mild right-sided hydronephrosis, with prominence of the proximal to mid right ureter. This appears to reflect partial encasement of the mid right ureter by the large right paraspinal mass. 3. Nodule at the left lung base, measuring 1.0 cm, of uncertain significance. This is apparently new from 58. Mild underlying bibasilar atelectasis noted. 4. Few scattered bilateral renal cysts. Mild bilateral renal scarring noted. 5. Diffuse calcification along the abdominal aorta and its branches. 6. Scattered diverticulosis of the splenic flexure of the colon, and along the descending and sigmoid colon, without evidence of diverticulitis.   Electronically Signed   By: Garald Balding M.D.   On: 11/24/2014 00:31   Ct Biopsy  11/23/2014   INDICATION: History of lung cancer, now with infiltrative right-sided paraspinal mass adjacent to the caudal aspect of the lumbar spine extending to involve the lumbar sacral junction. Please perform CT-guided biopsy for tissue diagnostic purposes  EXAM: CT-GUIDED BIOPSY OF INFILTRATIVE RIGHT PARASPINAL MASS.  COMPARISON:  Lumbar spine CT - 11/08/2014  MEDICATIONS: Fentanyl 25 mcg IV; Versed 1 mg IV  ANESTHESIA/SEDATION: Sedation time  8 minutes  CONTRAST:  None  COMPLICATIONS: None immediate   PROCEDURE: Informed consent was obtained from the patient following an explanation of the procedure, risks, benefits and alternatives. A time out was performed prior to the initiation of the procedure.  The patient was positioned prone on the CT table and a limited CT was performed for procedural planning demonstrating unchanged appearance of the note expansile paraspinal mass adjacent to the caudal aspect of the lumbar spine measuring at least 5.6 x 7.3 cm in diameter (image 16, series 2). The procedure was planned. The operative site was prepped and draped in the usual sterile fashion. Appropriate trajectory was confirmed  with a 22 gauge spinal needle after the adjacent tissues were anesthetized with 1% Lidocaine with epinephrine.  Under intermittent CT guidance, a 17 gauge coaxial needle was advanced into the peripheral aspect of the mass. Appropriate positioning was confirmed and 6 core needle biopsy samples were obtained with an 18 gauge core needle biopsy device. The co-axial needle was removed and hemostasis was achieved with manual compression.  A limited postprocedural CT was negative for hemorrhage or additional complication. A dressing was placed. The patient tolerated the procedure well without immediate postprocedural complication.  IMPRESSION: Technically successful CT guided core needle biopsy of expansile right-sided paraspinal mass adjacent to the caudal aspect of the lumbar spine.   Electronically Signed   By: Sandi Mariscal M.D.   On: 11/23/2014 10:13     Assessment/Plan Principal Problem:   Fever Active Problems:   CAD, NATIVE VESSEL   Hydronephrosis of right kidney   UTI (lower urinary tract infection)   Renal failure (ARF), acute on chronic   SIRS (systemic inflammatory response syndrome)   1. SIRS with possible developing sepsis secondary to UTI with right-sided hydronephrosis with tumor encasing the right ureter - I have discussed with on-call urologist Dr. Baltazar Najjar who will be  seeing patient in consult and Dr. Baltazar Najjar is advised to keep patient nothing by mouth in anticipation of possible stent placement. I have placed patient on Primaxin and also will add vancomycin for now and will place patient on sepsis protocol. Continue with hydration. 2. Acute on chronic renal failure - continue with hydration and closely follow intake and output. 3. Recently diagnosed right spinal mass status post biopsy yesterday with history of lung cancer - per oncologist. Patient is known to Dr. Julien Nordmann. 4. CAD status post stenting - denies any chest pain. 5. History of LV dysfunction at this time is improved and last EF was 50-55% with grade 1 diastolic dysfunction on February 2016. Closely watch out for any worsening breathing.    DVT Prophylaxis SCDs in anticipation of procedure.  Code Status: Full code.  Family Communication: Discussed with family.  Disposition Plan: Admit to inpatient.    Akiyah Eppolito N. [facility] Pager [phone].  If 7PM-7AM, please contact night-coverage [email or web address] Password [email or web address] 11/24/2014, 2:44 AM

## 2014-11-24 NOTE — Progress Notes (Signed)
ANTIBIOTIC CONSULT NOTE - INITIAL  Pharmacy Consult for Primaxin, vancomycin Indication: UTI/sepsis  Allergies  Allergen Reactions  . Amlodipine Swelling  . Ciprofloxacin Hives and Other (See Comments)    REACTION: weakness  . Statins Other (See Comments)  . Aspirin Swelling    Mouth swelling, tongue  . Cephalexin Hives  . Hydralazine Nausea And Vomiting  . Iron Nausea And Vomiting  . Sulfonamide Derivatives Hives  . Penicillins Other (See Comments)    Reaction is unknown    Patient Measurements: Height: '5\' 8"'$  (172.7 cm) Weight: 145 lb (65.772 kg) IBW/kg (Calculated) : 63.9 Adjusted Body Weight:   Vital Signs: Temp: 102.8 F (39.3 C) (06/23 0440) Temp Source: Oral (06/23 0440) BP: 172/66 mmHg (06/23 0440) Pulse Rate: 115 (06/23 0440) Intake/Output from previous day: 06/22 0701 - 06/23 0700 In: 0  Out: 75 [Urine:75] Intake/Output from this shift: Total I/O In: 0  Out: 75 [Urine:75]  Labs:  Recent Labs  11/23/14 0730 11/23/14 2148 11/24/14 0555  WBC 18.5* 18.0* 14.3*  HGB 10.2* 9.8* 9.2*  PLT 336 323 278  CREATININE  --  3.31*  --    Estimated Creatinine Clearance: 13.9 mL/min (by C-G formula based on Cr of 3.31). No results for input(s): VANCOTROUGH, VANCOPEAK, VANCORANDOM, GENTTROUGH, GENTPEAK, GENTRANDOM, TOBRATROUGH, TOBRAPEAK, TOBRARND, AMIKACINPEAK, AMIKACINTROU, AMIKACIN in the last 72 hours.   Microbiology: No results found for this or any previous visit (from the past 720 hour(s)).  Medical History: Past Medical History  Diagnosis Date  . Hypertension   . Hypercholesterolemia   . CKD (chronic kidney disease)   . Coronary artery disease     a.  LHC (06/04/05): LHC done in [place] with high grade RCA => s/p BMS to RCA;  b.  Nuclear (09/14/09): Lexiscan; Inf infarct with mild peri-infarct ishemia, EF 52%; Low Risk.  Marland Kitchen GERD (gastroesophageal reflux disease)   . Chronic anemia   . Ischemic cardiomyopathy     a. Echo (07/26/13): Mild LVH,  EF 35-40%, diff HK, inf AK, Gr 2 DD, Tr AI, mildly dilated Ao root, MAC, mild MR, mild LAE, mod reduced RVSF.  . Non-small cell carcinoma of lung     Stage IV    Medications:  Anti-infectives    Start     Dose/Rate Route Frequency Ordered Stop   11/24/14 1800  imipenem-cilastatin (PRIMAXIN) 250 mg in sodium chloride 0.9 % 100 mL IVPB     250 mg 200 mL/hr over 30 Minutes Intravenous Every 12 hours 11/24/14 0234     11/24/14 0800  vancomycin (VANCOCIN) IVPB 1000 mg/200 mL premix     1,000 mg 200 mL/hr over 60 Minutes Intravenous Every 48 hours 11/24/14 0700     11/24/14 0145  imipenem-cilastatin (PRIMAXIN) 500 mg in sodium chloride 0.9 % 100 mL IVPB     500 mg 200 mL/hr over 30 Minutes Intravenous  Once 11/24/14 0142 11/24/14 0418     Assessment: Patient with UTI and sepsis.  Patient with very poor renal function.  Goal of Therapy:  Vancomycin trough level 15-20 mcg/ml  primaxin dosed based on patient weight and renal function   Plan:  Measure antibiotic drug levels at steady state Follow up culture results Primaxin '500mg'$  iv x1, then '250mg'$  iv q12hr  Vancomycin 1gm iv q48hr  Nani Skillern Crowford 11/24/2014,7:00 AM

## 2014-11-24 NOTE — ED Notes (Addendum)
Pt's right antecubital vein is distended and bulging at IV insertion site; this is baseline.  IV is patent and not infiltrated and pt reports no pain at the site.

## 2014-11-24 NOTE — Progress Notes (Signed)
*  Preliminary Results* Bilateral lower extremity venous duplex completed. Bilateral lower extremities are positive for acute deep vein thrombosis involving the right saphenofemoral junction, left profunda femoral, left posterior tibial, and left peroneal veins. There is evidence of retrovalvular hyperacute platelet aggregation in the left distal popliteal vein. There is evidence of a small right Baker's cyst. No left Baker's cyst.  Preliminary results discussed with Dr. Broadus John.  11/24/2014  Maudry Mayhew, RVT, RDCS, RDMS

## 2014-11-24 NOTE — Progress Notes (Addendum)
[facility] PROGRESS NOTE  [name] MGQ:[id number] DOB: 1933/08/12 DOA: 11/23/2014 PCP: Delphina Cahill, MD  Assessment/Plan: 1. Early Sepsis -etiology possibly UTI, fever could be related to large mass, no necrosis seen on CT -urine Cx with GNR, continue broad spectrum ABx at this time, IVF -right sided mild hydronephrosis with tumor encasing the ureter and abnormal UA, Urology consulting, plan for posibly perc nephrostomy  2. Large R paraspinal mass -mets from Stage 4 Lung CA vs new primary, suspect former -s/p CT guided biopsy of lytic expansile mass 6/22 -monitor for neuro symptoms -FU Path, will notify Dr.Mohamed  3. AKI on CKD 3 -baseline creatinine around 2.8 -gentle hydration, monitor  4. H/o CAD -stable, h/o DES to RCA in 2007 -hold plavix, continue Toprol  5. Chronic diastolic CHF -stable, lasix on hold, resume tomorrow  6. HTN -stable, cut down clonidine dose   7. Leg swelling -likely due to mass and decreased venous return/lymphatic,  -also r/o DVT   Code Status: Full Code Family Communication: none at bedside Disposition Plan: home when stable   Consultants: Urology  HPI/Subjective: R flank/back pain earlier, much better now  Objective: Filed Vitals:   11/24/14 1039  BP: 122/48  Pulse: 77  Temp: 98.9 F (37.2 C)  Resp: 18    Intake/Output Summary (Last 24 hours) at 11/24/14 1052 Last data filed at 11/24/14 0911  Gross per 24 hour  Intake    140 ml  Output    150 ml  Net    -10 ml   Filed Weights   11/24/14 0440  Weight: 65.772 kg (145 lb)    Exam:   General: AAOx3, no distress, elderly female, pleasant, thinly built  Cardiovascular: S1S2/RRR  Respiratory: fine basilar crackles  Abdomen: soft, NT, BS present  Musculoskeletal: RLE 2plus edema >L   Data Reviewed: Basic Metabolic Panel:  Recent Labs Lab 11/23/14 2148 11/24/14 0555  NA 131* 132*  K 3.9 4.1  CL 95* 96*  CO2 21* 20*  GLUCOSE 174* 121*  BUN 58*  55*  CREATININE 3.31* 3.09*  CALCIUM 8.5* 8.4*   Liver Function Tests:  Recent Labs Lab 11/23/14 2148 11/24/14 0555  AST 22 21  ALT 11* 10*  ALKPHOS 117 103  BILITOT 0.6 0.7  PROT 7.3 6.4*  ALBUMIN 3.0* 2.5*   No results for input(s): LIPASE, AMYLASE in the last 168 hours. No results for input(s): AMMONIA in the last 168 hours. CBC:  Recent Labs Lab 11/23/14 0730 11/23/14 2148 11/24/14 0555  WBC 18.5* 18.0* 14.3*  NEUTROABS  --  16.5* 12.6*  HGB 10.2* 9.8* 9.2*  HCT 32.7* 30.6* 29.1*  MCV 82.2 81.6 81.5  PLT 336 323 278   Cardiac Enzymes: No results for input(s): CKTOTAL, CKMB, CKMBINDEX, TROPONINI in the last 168 hours. BNP (last 3 results) No results for input(s): BNP in the last 8760 hours.  ProBNP (last 3 results) No results for input(s): PROBNP in the last 8760 hours.  CBG: No results for input(s): GLUCAP in the last 168 hours.  Recent Results (from the past 240 hour(s))  Urine culture     Status: None (Preliminary result)   Collection Time: 11/23/14  8:22 AM  Result Value Ref Range Status   Specimen Description URINE, CLEAN CATCH  Final   Special Requests NONE  Final   Culture   Final    >=100,000 COLONIES/mL GRAM NEGATIVE RODS Performed at [facility]    Report Status PENDING  Incomplete  Blood culture (routine  x 2)     Status: None (Preliminary result)   Collection Time: 11/24/14  1:22 AM  Result Value Ref Range Status   Specimen Description BLOOD LEFT FOREARM  Final   Special Requests   Final    BOTTLES DRAWN AEROBIC AND ANAEROBIC 5CC Performed at [facility]    Culture PENDING  Incomplete   Report Status PENDING  Incomplete  Blood culture (routine x 2)     Status: None (Preliminary result)   Collection Time: 11/24/14  1:22 AM  Result Value Ref Range Status   Specimen Description RIGHT ANTECUBITAL  Final   Special Requests   Final    BOTTLES DRAWN AEROBIC AND ANAEROBIC 5CC Performed at [facility]    Culture  PENDING  Incomplete   Report Status PENDING  Incomplete     Studies: Ct Abdomen Pelvis Wo Contrast  11/24/2014   CLINICAL DATA:  Acute onset of fever and weakness. Right lower quadrant tenderness. Recent CT-guided biopsy of right paraspinal mass. Initial encounter.  EXAM: CT ABDOMEN AND PELVIS WITHOUT CONTRAST  TECHNIQUE: Multidetector CT imaging of the abdomen and pelvis was performed following the standard protocol without IV contrast.  COMPARISON:  CT of the abdomen and pelvis performed 06/23/2013, and lumbar spine CT performed 11/08/2014  FINDINGS: Mild bibasilar atelectasis is noted. This is somewhat nodular at the left lung base, measuring 1.0 cm. Diffuse coronary artery calcifications are seen.  The liver and spleen are unremarkable in appearance. The patient is status post cholecystectomy, with clips noted at the gallbladder fossa. The pancreas and adrenal glands are unremarkable.  There is mild right-sided hydronephrosis, with prominence of the proximal to mid right ureter. This appears to reflect partial encasement of the mid right ureter by the patient's large right paraspinal mass.  Nonspecific perinephric stranding is noted on the right. A few scattered hypodensities and hyperdensities within the kidneys likely reflect cysts. No renal or ureteral stones are identified. There is mild bilateral renal scarring.  No free fluid is identified. The small bowel is unremarkable in appearance. The stomach is within normal limits. No acute vascular abnormalities are seen. Diffuse calcification is seen along the abdominal aorta and its branches.  The patient is status post appendectomy. Scattered diverticulosis is noted at the splenic flexure of the colon, and along the descending and sigmoid colon, without evidence of diverticulitis.  The bladder is mildly distended and grossly unremarkable. The patient is status post hysterectomy. No suspicious adnexal masses are seen. No inguinal lymphadenopathy is seen.   There is a large 12.0 x 8.1 x 6.0 cm mass arising along the right side of the lower lumbar spine, with diffuse erosion into vertebral bodies at L4, L5 and S1, obscuring the exiting nerve roots on the right at these levels. No definite extension into the spinal canal is yet seen. The mass underlies the right psoas muscle and tracks about the distal IVC and right common iliac vein. There is diffuse encasement of the right internal iliac artery. Mass extends inferiorly medial to the right iliacus muscle.  No additional osseous abnormalities are identified.  IMPRESSION: 1. Large 12.0 x 8.1 x 6.0 cm right paraspinal mass is likely mildly increased in size from recent prior CT, with diffuse erosion into the vertebral bodies at L4, L5 and S1, obscuring the exiting nerve roots on the right at these levels. No definite extension into the spinal canal. Mass underlies the right psoas muscle and tracks about the distal SVC and right common iliac  vein. Diffuse encasement of the right internal iliac artery. 2. Mild right-sided hydronephrosis, with prominence of the proximal to mid right ureter. This appears to reflect partial encasement of the mid right ureter by the large right paraspinal mass. 3. Nodule at the left lung base, measuring 1.0 cm, of uncertain significance. This is apparently new from 29. Mild underlying bibasilar atelectasis noted. 4. Few scattered bilateral renal cysts. Mild bilateral renal scarring noted. 5. Diffuse calcification along the abdominal aorta and its branches. 6. Scattered diverticulosis of the splenic flexure of the colon, and along the descending and sigmoid colon, without evidence of diverticulitis.   Electronically Signed   By: Garald Balding M.D.   On: 11/24/2014 00:31   Ct Biopsy  11/23/2014   INDICATION: History of lung cancer, now with infiltrative right-sided paraspinal mass adjacent to the caudal aspect of the lumbar spine extending to involve the lumbar sacral junction. Please  perform CT-guided biopsy for tissue diagnostic purposes  EXAM: CT-GUIDED BIOPSY OF INFILTRATIVE RIGHT PARASPINAL MASS.  COMPARISON:  Lumbar spine CT - 11/08/2014  MEDICATIONS: Fentanyl 25 mcg IV; Versed 1 mg IV  ANESTHESIA/SEDATION: Sedation time  8 minutes  CONTRAST:  None  COMPLICATIONS: None immediate  PROCEDURE: Informed consent was obtained from the patient following an explanation of the procedure, risks, benefits and alternatives. A time out was performed prior to the initiation of the procedure.  The patient was positioned prone on the CT table and a limited CT was performed for procedural planning demonstrating unchanged appearance of the note expansile paraspinal mass adjacent to the caudal aspect of the lumbar spine measuring at least 5.6 x 7.3 cm in diameter (image 16, series 2). The procedure was planned. The operative site was prepped and draped in the usual sterile fashion. Appropriate trajectory was confirmed with a 22 gauge spinal needle after the adjacent tissues were anesthetized with 1% Lidocaine with epinephrine.  Under intermittent CT guidance, a 17 gauge coaxial needle was advanced into the peripheral aspect of the mass. Appropriate positioning was confirmed and 6 core needle biopsy samples were obtained with an 18 gauge core needle biopsy device. The co-axial needle was removed and hemostasis was achieved with manual compression.  A limited postprocedural CT was negative for hemorrhage or additional complication. A dressing was placed. The patient tolerated the procedure well without immediate postprocedural complication.  IMPRESSION: Technically successful CT guided core needle biopsy of expansile right-sided paraspinal mass adjacent to the caudal aspect of the lumbar spine.   Electronically Signed   By: Sandi Mariscal M.D.   On: 11/23/2014 10:13   Dg Chest Port 1 View  11/24/2014   CLINICAL DATA:  Fever.  Possible sepsis.  Initial encounter.  EXAM: PORTABLE CHEST - 1 VIEW  COMPARISON:   Chest radiograph performed 07/29/2014, and CT of the chest performed 06/27/2014  FINDINGS: The lungs are well-aerated. Vascular congestion is noted, with mild bibasilar scarring. There is no evidence of pleural effusion or pneumothorax. Known left-sided pulmonary nodules are partially characterized on radiograph.  The cardiomediastinal silhouette is mildly enlarged. No acute osseous abnormalities are seen.  IMPRESSION: 1. Vascular congestion and mild cardiomegaly, with mild bibasilar scarring. No definite evidence of pneumonia. 2. Known left-sided pulmonary nodules are partially characterized on radiograph.   Electronically Signed   By: Garald Balding M.D.   On: 11/24/2014 06:59    Scheduled Meds: . cloNIDine  0.2 mg Oral BID  . clopidogrel  75 mg Oral Daily  . imipenem-cilastatin  250 mg Intravenous Q12H  .  LORazepam  1 mg Oral 3 times per day  . metoprolol succinate  12.5 mg Oral Daily  . pantoprazole  40 mg Oral Daily  . [START ON 11/25/2014] pneumococcal 23 valent vaccine  0.5 mL Intramuscular Tomorrow-1000  . vancomycin  1,000 mg Intravenous Q48H   Continuous Infusions: . sodium chloride 75 mL/hr at 11/24/14 0508   Antibiotics Given (last 72 hours)    Date/Time Action Medication Dose Rate   11/24/14 0924 Given   vancomycin (VANCOCIN) IVPB 1000 mg/200 mL premix 1,000 mg 200 mL/hr      Principal Problem:   Fever Active Problems:   CAD, NATIVE VESSEL   Hydronephrosis of right kidney   UTI (lower urinary tract infection)   Renal failure (ARF), acute on chronic   SIRS (systemic inflammatory response syndrome)   Pressure ulcer    Time spent: 55mn    Nehemiah Montee  [facility] Pager [phone] If 7PM-7AM, please contact night-coverage at [email or web address], password [email or web address]6/23/2016, 10:52 AM  LOS: 0 days

## 2014-11-24 NOTE — Consult Note (Signed)
Urology Consult   Physician requesting consult: Milinda Pointer  Reason for consult: Right hydronephrosis  History of Present Illness: [name] is a 81 y.o.  female with history of stage IV lung cancer who was recently diagnosed with a right paraspinal mass and had undergone biopsy yesterday.  At home following the procedure, she began experiencing fever and chills.  She also noticed some worsening of right lower extremity swelling that has been progressing for some time.    In the ER, she underwent a CT scan which illustrated right sided mild hydronephrosis with tumor encasing the ureter. UA showed pyuria.  When she first was in the ER, she was afebrile but her most recent vitals show tachycardia and a fever to 102.8.    She is overall feeling ok at this time.  She isn't complaining of any flank pain or abdominal pain.  Past Medical History  Diagnosis Date  . Hypertension   . Hypercholesterolemia   . CKD (chronic kidney disease)   . Coronary artery disease     a.  LHC (06/04/05): LHC done in [place] with high grade RCA => s/p BMS to RCA;  b.  Nuclear (09/14/09): Lexiscan; Inf infarct with mild peri-infarct ishemia, EF 52%; Low Risk.  Marland Kitchen GERD (gastroesophageal reflux disease)   . Chronic anemia   . Ischemic cardiomyopathy     a. Echo (07/26/13): Mild LVH, EF 35-40%, diff HK, inf AK, Gr 2 DD, Tr AI, mildly dilated Ao root, MAC, mild MR, mild LAE, mod reduced RVSF.  . Non-small cell carcinoma of lung     Stage IV    Past Surgical History  Procedure Laterality Date  . Hematoma evacuation  December 2006    groin  . Appendectomy    . Cataract extraction    . Hemorroidectomy    . Laminectomy       Current Hospital Medications:  Home meds:    Medication List    ASK your doctor about these medications        cloNIDine 0.2 MG tablet  Commonly known as:  CATAPRES  Take 1 tablet (0.2 mg total) by mouth 2 (two) times daily.     clopidogrel 75 MG tablet  Commonly  known as:  PLAVIX  TAKE ONE TABLET DAILY.     furosemide 40 MG tablet  Commonly known as:  LASIX  Take 1 tablet (40 mg total) by mouth daily.     HYDROcodone-acetaminophen 5-325 MG per tablet  Commonly known as:  NORCO/VICODIN  Take 1 tablet by mouth every 6 (six) hours as needed.     loperamide 2 MG tablet  Commonly known as:  IMODIUM A-D  Take 2 mg by mouth 4 (four) times daily as needed for diarrhea or loose stools (diarrhea).     LORazepam 1 MG tablet  Commonly known as:  ATIVAN  Take 1 tablet (1 mg total) by mouth every 8 (eight) hours.     metoprolol succinate 25 MG 24 hr tablet  Commonly known as:  TOPROL-XL  TAKE 1/2 TABLET BY MOUTH AT BEDTIME.     pantoprazole 40 MG tablet  Commonly known as:  PROTONIX  TAKE ONE TABLET BY MOUTH ONCE DAILY.        Scheduled Meds: . cloNIDine  0.2 mg Oral BID  . clopidogrel  75 mg Oral Daily  . imipenem-cilastatin  250 mg Intravenous Q12H  . LORazepam  1 mg Oral 3 times per day  . metoprolol succinate  12.5 mg Oral  Daily  . pantoprazole  40 mg Oral Daily  . [START ON 11/25/2014] pneumococcal 23 valent vaccine  0.5 mL Intramuscular Tomorrow-1000   Continuous Infusions: . sodium chloride 75 mL/hr at 11/24/14 0508   PRN Meds:.acetaminophen **OR** acetaminophen, morphine injection, ondansetron **OR** ondansetron (ZOFRAN) IV  Allergies:  Allergies  Allergen Reactions  . Amlodipine Swelling  . Ciprofloxacin Hives and Other (See Comments)    REACTION: weakness  . Statins Other (See Comments)  . Aspirin Swelling    Mouth swelling, tongue  . Cephalexin Hives  . Hydralazine Nausea And Vomiting  . Iron Nausea And Vomiting  . Sulfonamide Derivatives Hives  . Penicillins Other (See Comments)    Reaction is unknown    Family History  Problem Relation Age of Onset  . Heart failure Mother   . Lung cancer Sister   . Lung cancer Brother   . Stomach cancer Brother   . Heart attack Neg Hx   . Stroke Neg Hx     Social History:   reports that she has quit smoking. Her smoking use included Cigarettes. She does not have any smokeless tobacco history on file. She reports that she does not drink alcohol. Her drug history is not on file.  ROS: A complete review of systems was performed.  All systems are negative except for pertinent findings as noted.  Physical Exam:  Vital signs in last 24 hours: Temp:  [98.9 F (37.2 C)-102.8 F (39.3 C)] 102.8 F (39.3 C) (06/23 0440) Pulse Rate:  [74-115] 115 (06/23 0440) Resp:  [18-21] 18 (06/23 0440) BP: (121-183)/(44-73) 172/66 mmHg (06/23 0440) SpO2:  [91 %-100 %] 93 % (06/23 0440) Weight:  [145 lb (65.772 kg)] 145 lb (65.772 kg) (06/23 0440) Constitutional:  Alert and oriented, No acute distress Cardiovascular: tachycardic rate, No JVD Respiratory: Normal respiratory effort, Lungs clear bilaterally GI: Abdomen is soft, nontender, nondistended, no abdominal masses GU: No CVA tenderness Lymphatic: No lymphadenopathy Neurologic: Grossly intact, no focal deficits Psychiatric: Normal mood and affect  Laboratory Data:   Recent Labs  11/23/14 0730 11/23/14 2148 11/24/14 0555  WBC 18.5* 18.0* 14.3*  HGB 10.2* 9.8* 9.2*  HCT 32.7* 30.6* 29.1*  PLT 336 323 278     Recent Labs  11/23/14 2148  NA 131*  K 3.9  CL 95*  GLUCOSE 174*  BUN 58*  CALCIUM 8.5*  CREATININE 3.31*     Results for orders placed or performed during the hospital encounter of 11/23/14 (from the past 24 hour(s))  CBC (if pt has a temp above 100.10F)     Status: Abnormal   Collection Time: 11/23/14  9:48 PM  Result Value Ref Range   WBC 18.0 (H) 4.0 - 10.5 K/uL   RBC 3.75 (L) 3.87 - 5.11 MIL/uL   Hemoglobin 9.8 (L) 12.0 - 15.0 g/dL   HCT 30.6 (L) 36.0 - 46.0 %   MCV 81.6 78.0 - 100.0 fL   MCH 26.1 26.0 - 34.0 pg   MCHC 32.0 30.0 - 36.0 g/dL   RDW 14.1 11.5 - 15.5 %   Platelets 323 150 - 400 K/uL  Comprehensive metabolic panel (if pt has a temp above 100.10F)     Status: Abnormal    Collection Time: 11/23/14  9:48 PM  Result Value Ref Range   Sodium 131 (L) 135 - 145 mmol/L   Potassium 3.9 3.5 - 5.1 mmol/L   Chloride 95 (L) 101 - 111 mmol/L   CO2 21 (L) 22 - 32 mmol/L  Glucose, Bld 174 (H) 65 - 99 mg/dL   BUN 58 (H) 6 - 20 mg/dL   Creatinine, Ser 3.31 (H) 0.44 - 1.00 mg/dL   Calcium 8.5 (L) 8.9 - 10.3 mg/dL   Total Protein 7.3 6.5 - 8.1 g/dL   Albumin 3.0 (L) 3.5 - 5.0 g/dL   AST 22 15 - 41 U/L   ALT 11 (L) 14 - 54 U/L   Alkaline Phosphatase 117 38 - 126 U/L   Total Bilirubin 0.6 0.3 - 1.2 mg/dL   GFR calc non Af Amer 12 (L) >60 mL/min   GFR calc Af Amer 14 (L) >60 mL/min   Anion gap 15 5 - 15  Differential     Status: Abnormal   Collection Time: 11/23/14  9:48 PM  Result Value Ref Range   Neutrophils Relative % 92 (H) 43 - 77 %   Neutro Abs 16.5 (H) 1.7 - 7.7 K/uL   Lymphocytes Relative 4 (L) 12 - 46 %   Lymphs Abs 0.8 0.7 - 4.0 K/uL   Monocytes Relative 4 3 - 12 %   Monocytes Absolute 0.7 0.1 - 1.0 K/uL   Eosinophils Relative 0 0 - 5 %   Eosinophils Absolute 0.0 0.0 - 0.7 K/uL   Basophils Relative 0 0 - 1 %   Basophils Absolute 0.0 0.0 - 0.1 K/uL  Urinalysis, Routine w reflex microscopic-may I&O cath if menses (not at [facility])     Status: Abnormal   Collection Time: 11/23/14 11:24 PM  Result Value Ref Range   Color, Urine YELLOW YELLOW   APPearance TURBID (A) CLEAR   Specific Gravity, Urine 1.010 1.005 - 1.030   pH 6.0 5.0 - 8.0   Glucose, UA NEGATIVE NEGATIVE mg/dL   Hgb urine dipstick LARGE (A) NEGATIVE   Bilirubin Urine NEGATIVE NEGATIVE   Ketones, ur NEGATIVE NEGATIVE mg/dL   Protein, ur 100 (A) NEGATIVE mg/dL   Urobilinogen, UA 1.0 0.0 - 1.0 mg/dL   Nitrite NEGATIVE NEGATIVE   Leukocytes, UA LARGE (A) NEGATIVE  Urine microscopic-add on     Status: None   Collection Time: 11/23/14 11:24 PM  Result Value Ref Range   WBC, UA TOO NUMEROUS TO COUNT <3 WBC/hpf   Urine-Other FIELD OBSCURED BY WBC'S   CBC WITH DIFFERENTIAL     Status: Abnormal    Collection Time: 11/24/14  5:55 AM  Result Value Ref Range   WBC 14.3 (H) 4.0 - 10.5 K/uL   RBC 3.57 (L) 3.87 - 5.11 MIL/uL   Hemoglobin 9.2 (L) 12.0 - 15.0 g/dL   HCT 29.1 (L) 36.0 - 46.0 %   MCV 81.5 78.0 - 100.0 fL   MCH 25.8 (L) 26.0 - 34.0 pg   MCHC 31.6 30.0 - 36.0 g/dL   RDW 14.3 11.5 - 15.5 %   Platelets 278 150 - 400 K/uL   Neutrophils Relative % 89 (H) 43 - 77 %   Neutro Abs 12.6 (H) 1.7 - 7.7 K/uL   Lymphocytes Relative 5 (L) 12 - 46 %   Lymphs Abs 0.7 0.7 - 4.0 K/uL   Monocytes Relative 6 3 - 12 %   Monocytes Absolute 0.9 0.1 - 1.0 K/uL   Eosinophils Relative 0 0 - 5 %   Eosinophils Absolute 0.0 0.0 - 0.7 K/uL   Basophils Relative 0 0 - 1 %   Basophils Absolute 0.0 0.0 - 0.1 K/uL   No results found for this or any previous visit (from the past 240 hour(s)).  Renal Function:  Recent Labs  11/23/14 2148  CREATININE 3.31*   Estimated Creatinine Clearance: 13.9 mL/min (by C-G formula based on Cr of 3.31).  Radiologic Imaging: Ct Abdomen Pelvis Wo Contrast  11/24/2014   CLINICAL DATA:  Acute onset of fever and weakness. Right lower quadrant tenderness. Recent CT-guided biopsy of right paraspinal mass. Initial encounter.  EXAM: CT ABDOMEN AND PELVIS WITHOUT CONTRAST  TECHNIQUE: Multidetector CT imaging of the abdomen and pelvis was performed following the standard protocol without IV contrast.  COMPARISON:  CT of the abdomen and pelvis performed 06/23/2013, and lumbar spine CT performed 11/08/2014  FINDINGS: Mild bibasilar atelectasis is noted. This is somewhat nodular at the left lung base, measuring 1.0 cm. Diffuse coronary artery calcifications are seen.  The liver and spleen are unremarkable in appearance. The patient is status post cholecystectomy, with clips noted at the gallbladder fossa. The pancreas and adrenal glands are unremarkable.  There is mild right-sided hydronephrosis, with prominence of the proximal to mid right ureter. This appears to reflect partial  encasement of the mid right ureter by the patient's large right paraspinal mass.  Nonspecific perinephric stranding is noted on the right. A few scattered hypodensities and hyperdensities within the kidneys likely reflect cysts. No renal or ureteral stones are identified. There is mild bilateral renal scarring.  No free fluid is identified. The small bowel is unremarkable in appearance. The stomach is within normal limits. No acute vascular abnormalities are seen. Diffuse calcification is seen along the abdominal aorta and its branches.  The patient is status post appendectomy. Scattered diverticulosis is noted at the splenic flexure of the colon, and along the descending and sigmoid colon, without evidence of diverticulitis.  The bladder is mildly distended and grossly unremarkable. The patient is status post hysterectomy. No suspicious adnexal masses are seen. No inguinal lymphadenopathy is seen.  There is a large 12.0 x 8.1 x 6.0 cm mass arising along the right side of the lower lumbar spine, with diffuse erosion into vertebral bodies at L4, L5 and S1, obscuring the exiting nerve roots on the right at these levels. No definite extension into the spinal canal is yet seen. The mass underlies the right psoas muscle and tracks about the distal IVC and right common iliac vein. There is diffuse encasement of the right internal iliac artery. Mass extends inferiorly medial to the right iliacus muscle.  No additional osseous abnormalities are identified.  IMPRESSION: 1. Large 12.0 x 8.1 x 6.0 cm right paraspinal mass is likely mildly increased in size from recent prior CT, with diffuse erosion into the vertebral bodies at L4, L5 and S1, obscuring the exiting nerve roots on the right at these levels. No definite extension into the spinal canal. Mass underlies the right psoas muscle and tracks about the distal SVC and right common iliac vein. Diffuse encasement of the right internal iliac artery. 2. Mild right-sided  hydronephrosis, with prominence of the proximal to mid right ureter. This appears to reflect partial encasement of the mid right ureter by the large right paraspinal mass. 3. Nodule at the left lung base, measuring 1.0 cm, of uncertain significance. This is apparently new from 82. Mild underlying bibasilar atelectasis noted. 4. Few scattered bilateral renal cysts. Mild bilateral renal scarring noted. 5. Diffuse calcification along the abdominal aorta and its branches. 6. Scattered diverticulosis of the splenic flexure of the colon, and along the descending and sigmoid colon, without evidence of diverticulitis.   Electronically Signed   By: Garald Balding  M.D.   On: 11/24/2014 00:31   Ct Biopsy  11/23/2014   INDICATION: History of lung cancer, now with infiltrative right-sided paraspinal mass adjacent to the caudal aspect of the lumbar spine extending to involve the lumbar sacral junction. Please perform CT-guided biopsy for tissue diagnostic purposes  EXAM: CT-GUIDED BIOPSY OF INFILTRATIVE RIGHT PARASPINAL MASS.  COMPARISON:  Lumbar spine CT - 11/08/2014  MEDICATIONS: Fentanyl 25 mcg IV; Versed 1 mg IV  ANESTHESIA/SEDATION: Sedation time  8 minutes  CONTRAST:  None  COMPLICATIONS: None immediate  PROCEDURE: Informed consent was obtained from the patient following an explanation of the procedure, risks, benefits and alternatives. A time out was performed prior to the initiation of the procedure.  The patient was positioned prone on the CT table and a limited CT was performed for procedural planning demonstrating unchanged appearance of the note expansile paraspinal mass adjacent to the caudal aspect of the lumbar spine measuring at least 5.6 x 7.3 cm in diameter (image 16, series 2). The procedure was planned. The operative site was prepped and draped in the usual sterile fashion. Appropriate trajectory was confirmed with a 22 gauge spinal needle after the adjacent tissues were anesthetized with 1% Lidocaine  with epinephrine.  Under intermittent CT guidance, a 17 gauge coaxial needle was advanced into the peripheral aspect of the mass. Appropriate positioning was confirmed and 6 core needle biopsy samples were obtained with an 18 gauge core needle biopsy device. The co-axial needle was removed and hemostasis was achieved with manual compression.  A limited postprocedural CT was negative for hemorrhage or additional complication. A dressing was placed. The patient tolerated the procedure well without immediate postprocedural complication.  IMPRESSION: Technically successful CT guided core needle biopsy of expansile right-sided paraspinal mass adjacent to the caudal aspect of the lumbar spine.   Electronically Signed   By: Sandi Mariscal M.D.   On: 11/23/2014 10:13    I independently reviewed the above imaging studies.  Impression/Recommendation: 81 yo with history of advanced lung cancer who is POD#1 from a perc biopsy of right retroperitoneal / paraspinal mass.  She is now having fevers and tachycardia.  CT scan shows new right hydro related to right ureter encased with retroperitoneal soft tissue mass.   Urine culture is growing out gram-negative rods.  I have spoken with the patient and suggest, at this point, placement of percutaneous nephrostomy tube. This can be internalized to a stent at a later time. The patient has been off of her Plavix for approximately 1 week, so I think she is a good candidate for this procedure. I discussed this with interventional radiology as well as the patient. Hopefully, we will proceed with this in the near future.

## 2014-11-24 NOTE — Progress Notes (Signed)
ANTICOAGULATION CONSULT NOTE - Initial Consult  Pharmacy Consult for heparin Indication: new DVT  Allergies  Allergen Reactions  . Amlodipine Swelling  . Ciprofloxacin Hives and Other (See Comments)    REACTION: weakness  . Statins Other (See Comments)  . Aspirin Swelling    Mouth swelling, tongue  . Cephalexin Hives  . Hydralazine Nausea And Vomiting  . Iron Nausea And Vomiting  . Sulfonamide Derivatives Hives  . Penicillins Other (See Comments)    Reaction is unknown    Patient Measurements: Height: '5\' 8"'$  (172.7 cm) Weight: 145 lb (65.772 kg) IBW/kg (Calculated) : 63.9 Heparin Dosing Weight: 66 kg  Vital Signs: Temp: 98.9 F (37.2 C) (06/23 1039) Temp Source: Oral (06/23 1039) BP: 122/48 mmHg (06/23 1039) Pulse Rate: 77 (06/23 1039)  Labs:  Recent Labs  11/23/14 0730 11/23/14 2148 11/24/14 0555 11/24/14 0700  HGB 10.2* 9.8* 9.2*  --   HCT 32.7* 30.6* 29.1*  --   PLT 336 323 278  --   APTT 36  --   --  35  LABPROT 15.6*  --   --  16.8*  INR 1.22  --   --  1.35  CREATININE  --  3.31* 3.09*  --     Estimated Creatinine Clearance: 14.9 mL/min (by C-G formula based on Cr of 3.09).   Medical History: Past Medical History  Diagnosis Date  . Hypertension   . Hypercholesterolemia   . CKD (chronic kidney disease)   . Coronary artery disease     a.  LHC (06/04/05): LHC done in [place] with high grade RCA => s/p BMS to RCA;  b.  Nuclear (09/14/09): Lexiscan; Inf infarct with mild peri-infarct ishemia, EF 52%; Low Risk.  Marland Kitchen GERD (gastroesophageal reflux disease)   . Chronic anemia   . Ischemic cardiomyopathy     a. Echo (07/26/13): Mild LVH, EF 35-40%, diff HK, inf AK, Gr 2 DD, Tr AI, mildly dilated Ao root, MAC, mild MR, mild LAE, mod reduced RVSF.  . Non-small cell carcinoma of lung     Stage IV    Assessment: Patient's a 81 y.o F with stage 4 metastatic lung cancer and R paraspinal mass (s/p biopsy on 6/22).  She also has a new right hydronephrosis  with plan for R perc nephrostomy placement today. She c/o RLE edema on admission and Doppler now back positive for new BL  DVT.  Per MD, to start heparin 6 hours after perc nephrostomy drain placement for thrombosis.  Goal of Therapy:  Heparin level 0.3-0.7 units/ml Monitor platelets by anticoagulation protocol: Yes   Plan:  - will start heparin drip at 900 units/hr (no bolus) 6 hours post procedure today - check heparin level 8 hours after starting drip - monitor for s/s bleeding  Sulayman Manning P 11/24/2014,1:28 PM

## 2014-11-24 NOTE — Consult Note (Signed)
Chief Complaint: Fever Tachy hydronephrosis  Referring Physician(s): Dr Diona Fanti  History of Present Illness: [name] is a 81 y.o. female   Hx Lung Ca Bx of Rt paraspinal mass just 6/22--pending Fever 102.8 Tachycardic Cr 3.31; wbc 18 Now admitted for same Evaluation reveals R hydronephrosis Request for percutaneous nephrostomy placement per Dr Diona Fanti Dr Anselm Pancoast has reviewed imaging and approves procedure  Past Medical History  Diagnosis Date  . Hypertension   . Hypercholesterolemia   . CKD (chronic kidney disease)   . Coronary artery disease     a.  LHC (06/04/05): LHC done in [place] with high grade RCA => s/p BMS to RCA;  b.  Nuclear (09/14/09): Lexiscan; Inf infarct with mild peri-infarct ishemia, EF 52%; Low Risk.  Marland Kitchen GERD (gastroesophageal reflux disease)   . Chronic anemia   . Ischemic cardiomyopathy     a. Echo (07/26/13): Mild LVH, EF 35-40%, diff HK, inf AK, Gr 2 DD, Tr AI, mildly dilated Ao root, MAC, mild MR, mild LAE, mod reduced RVSF.  . Non-small cell carcinoma of lung     Stage IV    Past Surgical History  Procedure Laterality Date  . Hematoma evacuation  December 2006    groin  . Appendectomy    . Cataract extraction    . Hemorroidectomy    . Laminectomy      Allergies: Amlodipine; Ciprofloxacin; Statins; Aspirin; Cephalexin; Hydralazine; Iron; Sulfonamide derivatives; and Penicillins  Medications: Prior to Admission medications   Medication Sig Start Date End Date Taking? Authorizing Provider  cloNIDine (CATAPRES) 0.2 MG tablet Take 1 tablet (0.2 mg total) by mouth 2 (two) times daily. 05/16/14  Yes Liliane Shi, PA-C  clopidogrel (PLAVIX) 75 MG tablet TAKE ONE TABLET DAILY. 04/06/14  Yes Sherren Mocha, MD  furosemide (LASIX) 40 MG tablet Take 1 tablet (40 mg total) by mouth daily. 02/03/14  Yes Sherren Mocha, MD  HYDROcodone-acetaminophen (NORCO/VICODIN) 5-325 MG per tablet Take 1 tablet by mouth every 6 (six) hours as  needed. 11/15/14  Yes Maryanna Shape, NP  loperamide (IMODIUM A-D) 2 MG tablet Take 2 mg by mouth 4 (four) times daily as needed for diarrhea or loose stools (diarrhea).   Yes Historical Provider, MD  metoprolol succinate (TOPROL-XL) 25 MG 24 hr tablet TAKE 1/2 TABLET BY MOUTH AT BEDTIME. 08/26/14  Yes Sherren Mocha, MD  pantoprazole (PROTONIX) 40 MG tablet TAKE ONE TABLET BY MOUTH ONCE DAILY. 10/14/14  Yes Sherren Mocha, MD  LORazepam (ATIVAN) 1 MG tablet Take 1 tablet (1 mg total) by mouth every 8 (eight) hours. 11/16/14   Maryanna Shape, NP     Family History  Problem Relation Age of Onset  . Heart failure Mother   . Lung cancer Sister   . Lung cancer Brother   . Stomach cancer Brother   . Heart attack Neg Hx   . Stroke Neg Hx     History   Social History  . Marital Status: Widowed    Spouse Name: [name]  . Number of Children: [name]  . Years of Education: [name]   Social History Main Topics  . Smoking status: Former Smoker    Types: Cigarettes  . Smokeless tobacco: Not on file  . Alcohol Use: No  . Drug Use: Not on file  . Sexual Activity: Not on file   Other Topics Concern  . None   Social History Narrative     Review of Systems: A 12 point ROS discussed and  pertinent positives are indicated in the HPI above.  All other systems are negative.  Review of Systems  Constitutional: Positive for activity change.  Respiratory: Negative for shortness of breath.   Cardiovascular: Negative for chest pain.  Gastrointestinal: Positive for nausea and abdominal pain.  Genitourinary: Positive for flank pain.  Musculoskeletal: Positive for back pain.  Neurological: Positive for weakness.  Psychiatric/Behavioral: Negative for behavioral problems and confusion.    Vital Signs: BP 122/48 mmHg  Pulse 77  Temp(Src) 98.9 F (37.2 C) (Oral)  Resp 18  Ht '5\' 8"'$  (1.727 m)  Wt 145 lb (65.772 kg)  BMI 22.05 kg/m2  SpO2 96%  Physical Exam  Constitutional: She is oriented to person,  place, and time.  Cardiovascular: Normal rate and regular rhythm.   Pulmonary/Chest: Effort normal and breath sounds normal. She has no wheezes.  Abdominal: Soft. Bowel sounds are normal.  Musculoskeletal: Normal range of motion.  Neurological: She is alert and oriented to person, place, and time.  Skin: Skin is warm and dry.  Psychiatric: She has a normal mood and affect. Her behavior is normal. Judgment and thought content normal.  Nursing note and vitals reviewed.   Mallampati Score:  MD Evaluation Airway: WNL Heart: WNL Abdomen: WNL Chest/ Lungs: WNL ASA  Classification: 3 Mallampati/Airway Score: One  Imaging: Ct Abdomen Pelvis Wo Contrast  11/24/2014   CLINICAL DATA:  Acute onset of fever and weakness. Right lower quadrant tenderness. Recent CT-guided biopsy of right paraspinal mass. Initial encounter.  EXAM: CT ABDOMEN AND PELVIS WITHOUT CONTRAST  TECHNIQUE: Multidetector CT imaging of the abdomen and pelvis was performed following the standard protocol without IV contrast.  COMPARISON:  CT of the abdomen and pelvis performed 06/23/2013, and lumbar spine CT performed 11/08/2014  FINDINGS: Mild bibasilar atelectasis is noted. This is somewhat nodular at the left lung base, measuring 1.0 cm. Diffuse coronary artery calcifications are seen.  The liver and spleen are unremarkable in appearance. The patient is status post cholecystectomy, with clips noted at the gallbladder fossa. The pancreas and adrenal glands are unremarkable.  There is mild right-sided hydronephrosis, with prominence of the proximal to mid right ureter. This appears to reflect partial encasement of the mid right ureter by the patient's large right paraspinal mass.  Nonspecific perinephric stranding is noted on the right. A few scattered hypodensities and hyperdensities within the kidneys likely reflect cysts. No renal or ureteral stones are identified. There is mild bilateral renal scarring.  No free fluid is identified.  The small bowel is unremarkable in appearance. The stomach is within normal limits. No acute vascular abnormalities are seen. Diffuse calcification is seen along the abdominal aorta and its branches.  The patient is status post appendectomy. Scattered diverticulosis is noted at the splenic flexure of the colon, and along the descending and sigmoid colon, without evidence of diverticulitis.  The bladder is mildly distended and grossly unremarkable. The patient is status post hysterectomy. No suspicious adnexal masses are seen. No inguinal lymphadenopathy is seen.  There is a large 12.0 x 8.1 x 6.0 cm mass arising along the right side of the lower lumbar spine, with diffuse erosion into vertebral bodies at L4, L5 and S1, obscuring the exiting nerve roots on the right at these levels. No definite extension into the spinal canal is yet seen. The mass underlies the right psoas muscle and tracks about the distal IVC and right common iliac vein. There is diffuse encasement of the right internal iliac artery. Mass extends inferiorly medial  to the right iliacus muscle.  No additional osseous abnormalities are identified.  IMPRESSION: 1. Large 12.0 x 8.1 x 6.0 cm right paraspinal mass is likely mildly increased in size from recent prior CT, with diffuse erosion into the vertebral bodies at L4, L5 and S1, obscuring the exiting nerve roots on the right at these levels. No definite extension into the spinal canal. Mass underlies the right psoas muscle and tracks about the distal SVC and right common iliac vein. Diffuse encasement of the right internal iliac artery. 2. Mild right-sided hydronephrosis, with prominence of the proximal to mid right ureter. This appears to reflect partial encasement of the mid right ureter by the large right paraspinal mass. 3. Nodule at the left lung base, measuring 1.0 cm, of uncertain significance. This is apparently new from 16. Mild underlying bibasilar atelectasis noted. 4. Few scattered  bilateral renal cysts. Mild bilateral renal scarring noted. 5. Diffuse calcification along the abdominal aorta and its branches. 6. Scattered diverticulosis of the splenic flexure of the colon, and along the descending and sigmoid colon, without evidence of diverticulitis.   Electronically Signed   By: Garald Balding M.D.   On: 11/24/2014 00:31   Ct Lumbar Spine Wo Contrast  11/08/2014   CLINICAL DATA:  Low back pain for 3 weeks. Two falls in 1 day. Severe right hip and right leg pain. Personal history of stage IV non-small cell lung cancer. Prior chemotherapy. No current therapy.  EXAM: CT LUMBAR SPINE WITHOUT CONTRAST  TECHNIQUE: Multidetector CT imaging of the lumbar spine was performed without intravenous contrast administration. Multiplanar CT image reconstructions were also generated.  COMPARISON:  Lumbar spine radiographs 10/25/2014. CT abdomen and pelvis 06/23/2013  FINDINGS: Vertebral alignment is unchanged with grade 1 anterolisthesis of L3 on L4 and L4 on L5 and grade 1 retrolisthesis of L5 on S1. There is slight lumbar levoscoliosis. Disc space narrowing is mild at L4-5 and moderate to severe at L5-S1 and with associated vacuum disc phenomenon.  There is a new, large right-sided lumbosacral paravertebral soft tissue mass which measures approximately 6.9 x 7.1 x 10.5 cm. This invades the lateral aspects of the L5 greater than L4 vertebral bodies. There is involvement of the right L5 pedicle and transverse process, and there is also prominent involvement of the right sacral ala and right S1 vertebral body. The mass displaces the right psoas muscle laterally and encases the right internal iliac artery as well as right common, internal and external iliac veins. It also partially encircles the lower most IVC. There is a pathologic compression fracture of the right half of the L5 vertebral body with mild vertebral body height loss. Tumor extends into the superior aspect of the right L5-S1 neural foramen.   Extensive atherosclerotic vascular calcification is present in the abdomen and pelvis. The kidneys are atrophic with similar appearance of small hypodense renal lesions most likely representing cysts. Cholecystectomy clips are noted.  L1-2: Mild disc bulging without significant stenosis.  L2-3: Mild disc bulging and mild facet and ligamentum flavum hypertrophy result in mild right lateral recess stenosis and mild right and minimal left neural foraminal stenosis without significant spinal stenosis.  L3-4: Listhesis with disc uncovering, ligamentum flavum hypertrophy, and severe bilateral facet arthrosis result in mild to moderate right and minimal left neural foraminal narrowing without significant spinal stenosis.  L4-5: Listhesis with disc uncovering and moderate facet arthrosis without significant stenosis. The right L4 nerve root is likely involved by tumor lateral to the foramen.  L5-S1: Listhesis  with disc uncovering and mild facet arthrosis without significant stenosis. The right L5 nerve root is likely involved by tumor lateral to the foramen.  IMPRESSION: New 11 cm destructive right-sided paravertebral soft tissue mass at the lumbosacral junction, consistent with metastasis. There is osseous involvement at L4, L5, and S1 with pathologic L5 compression fracture. Right-sided iliac arterial and venous encasement as above.  These results were called by telephone at the time of interpretation on 11/08/2014 at 4:01 pm to Dr. Arther Abbott , who verbally acknowledged these results.   Electronically Signed   By: Logan Bores   On: 11/08/2014 16:05   US Venous Img Lower Unilateral Right  11/08/2014   CLINICAL DATA:  Right lower extremity pain and edema. History of smoking. History of lung cancer. Evaluate for DVT.  EXAM: RIGHT LOWER EXTREMITY VENOUS DOPPLER ULTRASOUND  TECHNIQUE: Gray-scale sonography with graded compression, as well as color Doppler and duplex ultrasound were performed to evaluate the lower  extremity deep venous systems from the level of the common femoral vein and including the common femoral, femoral, profunda femoral, popliteal and calf veins including the posterior tibial, peroneal and gastrocnemius veins when visible. The superficial great saphenous vein was also interrogated. Spectral Doppler was utilized to evaluate flow at rest and with distal augmentation maneuvers in the common femoral, femoral and popliteal veins.  COMPARISON:  None.  FINDINGS: Contralateral Common Femoral Vein: Respiratory phasicity is normal and symmetric with the symptomatic side. No evidence of thrombus. Normal compressibility.  Common Femoral Vein: No evidence of thrombus. Normal compressibility, respiratory phasicity and response to augmentation.  Saphenofemoral Junction: No evidence of thrombus. Normal compressibility and flow on color Doppler imaging.  Profunda Femoral Vein: No evidence of thrombus. Normal compressibility and flow on color Doppler imaging.  Femoral Vein: No evidence of thrombus. Normal compressibility, respiratory phasicity and response to augmentation.  Popliteal Vein: No evidence of thrombus. Normal compressibility, respiratory phasicity and response to augmentation.  Calf Veins: No evidence of thrombus. Normal compressibility and flow on color Doppler imaging.  Superficial Great Saphenous Vein: No evidence of thrombus. Normal compressibility and flow on color Doppler imaging.  Venous Reflux:  None.  Other Findings: Subcutaneous edema is noted at the level of the calf and ankle (representative images 38, 39 and 40).  IMPRESSION: No evidence of DVT within the right lower extremity.   Electronically Signed   By: Sandi Mariscal M.D.   On: 11/08/2014 15:41   Ct Biopsy  11/23/2014   INDICATION: History of lung cancer, now with infiltrative right-sided paraspinal mass adjacent to the caudal aspect of the lumbar spine extending to involve the lumbar sacral junction. Please perform CT-guided biopsy for  tissue diagnostic purposes  EXAM: CT-GUIDED BIOPSY OF INFILTRATIVE RIGHT PARASPINAL MASS.  COMPARISON:  Lumbar spine CT - 11/08/2014  MEDICATIONS: Fentanyl 25 mcg IV; Versed 1 mg IV  ANESTHESIA/SEDATION: Sedation time  8 minutes  CONTRAST:  None  COMPLICATIONS: None immediate  PROCEDURE: Informed consent was obtained from the patient following an explanation of the procedure, risks, benefits and alternatives. A time out was performed prior to the initiation of the procedure.  The patient was positioned prone on the CT table and a limited CT was performed for procedural planning demonstrating unchanged appearance of the note expansile paraspinal mass adjacent to the caudal aspect of the lumbar spine measuring at least 5.6 x 7.3 cm in diameter (image 16, series 2). The procedure was planned. The operative site was prepped and draped in the usual  sterile fashion. Appropriate trajectory was confirmed with a 22 gauge spinal needle after the adjacent tissues were anesthetized with 1% Lidocaine with epinephrine.  Under intermittent CT guidance, a 17 gauge coaxial needle was advanced into the peripheral aspect of the mass. Appropriate positioning was confirmed and 6 core needle biopsy samples were obtained with an 18 gauge core needle biopsy device. The co-axial needle was removed and hemostasis was achieved with manual compression.  A limited postprocedural CT was negative for hemorrhage or additional complication. A dressing was placed. The patient tolerated the procedure well without immediate postprocedural complication.  IMPRESSION: Technically successful CT guided core needle biopsy of expansile right-sided paraspinal mass adjacent to the caudal aspect of the lumbar spine.   Electronically Signed   By: Sandi Mariscal M.D.   On: 11/23/2014 10:13   Dg Chest Port 1 View  11/24/2014   CLINICAL DATA:  Fever.  Possible sepsis.  Initial encounter.  EXAM: PORTABLE CHEST - 1 VIEW  COMPARISON:  Chest radiograph performed  07/29/2014, and CT of the chest performed 06/27/2014  FINDINGS: The lungs are well-aerated. Vascular congestion is noted, with mild bibasilar scarring. There is no evidence of pleural effusion or pneumothorax. Known left-sided pulmonary nodules are partially characterized on radiograph.  The cardiomediastinal silhouette is mildly enlarged. No acute osseous abnormalities are seen.  IMPRESSION: 1. Vascular congestion and mild cardiomegaly, with mild bibasilar scarring. No definite evidence of pneumonia. 2. Known left-sided pulmonary nodules are partially characterized on radiograph.   Electronically Signed   By: Garald Balding M.D.   On: 11/24/2014 06:59    Labs:  CBC:  Recent Labs  11/15/14 1445 11/23/14 0730 11/23/14 2148 11/24/14 0555  WBC 10.6* 18.5* 18.0* 14.3*  HGB 10.4* 10.2* 9.8* 9.2*  HCT 31.5* 32.7* 30.6* 29.1*  PLT 375 336 323 278    COAGS:  Recent Labs  11/23/14 0730 11/24/14 0700  INR 1.22 1.35  APTT 36 35    BMP:  Recent Labs  05/16/14 1459  07/28/14 2236 11/15/14 1445 11/23/14 2148 11/24/14 0555  NA 138  < > 135 137 131* 132*  K 3.2*  < > 3.5 3.9 3.9 4.1  CL 99  --  101  --  95* 96*  CO2 30  < > 27 24 21* 20*  GLUCOSE 106*  < > 115* 125 174* 121*  BUN 40*  < > 35* 54.2* 58* 55*  CALCIUM 9.3  < > 8.5 9.5 8.5* 8.4*  CREATININE 2.4*  < > 2.20* 2.8* 3.31* 3.09*  GFRNONAA  --   --  20*  --  12* 13*  GFRAA  --   --  23*  --  14* 15*  < > = values in this interval not displayed.  LIVER FUNCTION TESTS:  Recent Labs  06/27/14 1400 11/15/14 1445 11/23/14 2148 11/24/14 0555  BILITOT 0.51 0.68 0.6 0.7  AST '13 21 22 21  '$ ALT 7 12 11* 10*  ALKPHOS 125 137 117 103  PROT 7.2 7.5 7.3 6.4*  ALBUMIN 3.3* 2.9* 3.0* 2.5*    TUMOR MARKERS: No results for input(s): AFPTM, CEA, CA199, CHROMGRNA in the last 8760 hours.  Assessment and Plan:  Lung ca hx Rt paraspinal mass bx 6/22 Now with fever and tachy New Rt hydronephrosis Now scheduled for R  percutaneous nephrostomy placement Risks and Benefits discussed with the patient including, but not limited to infection, bleeding, significant bleeding causing loss or decrease in renal function or damage to adjacent structures.  All  of the patient's questions were answered, patient is agreeable to proceed. Consent signed and in chart.   Thank you for this interesting consult.  I greatly enjoyed meeting KEHINDE TOTZKE and look forward to participating in their care.  Signed: Krystine Pabst A 11/24/2014, 1:02 PM   I spent a total of 40 Minutes    in face to face in clinical consultation, greater than 50% of which was counseling/coordinating care for R PCN

## 2014-11-24 NOTE — ED Notes (Signed)
Pt's daughter Suanne Marker requested updates by phone as needed. Phone number [phone].

## 2014-11-24 NOTE — Procedures (Signed)
Right nephrostomy tube placement.  Mild hydronephrosis.   Placed a 10 Fr drain.  Minimal blood loss and no immediate complication.

## 2014-11-24 NOTE — ED Notes (Signed)
Please call daughter [name] on home or cell number with updates or changes to pt condition. Numbers are on file.

## 2014-11-25 ENCOUNTER — Ambulatory Visit (HOSPITAL_COMMUNITY): Admission: RE | Admit: 2014-11-25 | Payer: Medicare Other | Source: Ambulatory Visit

## 2014-11-25 ENCOUNTER — Encounter (HOSPITAL_COMMUNITY): Payer: Medicare Other

## 2014-11-25 LAB — CBC
HEMATOCRIT: 26.5 % — AB (ref 36.0–46.0)
HEMOGLOBIN: 8.5 g/dL — AB (ref 12.0–15.0)
MCH: 26.2 pg (ref 26.0–34.0)
MCHC: 32.1 g/dL (ref 30.0–36.0)
MCV: 81.5 fL (ref 78.0–100.0)
Platelets: 240 10*3/uL (ref 150–400)
RBC: 3.25 MIL/uL — ABNORMAL LOW (ref 3.87–5.11)
RDW: 14.4 % (ref 11.5–15.5)
WBC: 10.5 10*3/uL (ref 4.0–10.5)

## 2014-11-25 LAB — HEPARIN LEVEL (UNFRACTIONATED)
HEPARIN UNFRACTIONATED: 0.1 [IU]/mL — AB (ref 0.30–0.70)
Heparin Unfractionated: 0.12 IU/mL — ABNORMAL LOW (ref 0.30–0.70)

## 2014-11-25 MED ORDER — HEPARIN (PORCINE) IN NACL 100-0.45 UNIT/ML-% IJ SOLN
1400.0000 [IU]/h | INTRAMUSCULAR | Status: DC
  Administered 2014-11-25: 1400 [IU]/h via INTRAVENOUS
  Filled 2014-11-25 (×2): qty 250

## 2014-11-25 MED ORDER — COUMADIN BOOK
Freq: Once | Status: AC
  Administered 2014-11-25: 18:00:00
  Filled 2014-11-25: qty 1

## 2014-11-25 MED ORDER — WARFARIN VIDEO
Freq: Once | Status: AC
  Administered 2014-11-25: 18:00:00

## 2014-11-25 MED ORDER — ALUM & MAG HYDROXIDE-SIMETH 200-200-20 MG/5ML PO SUSP
30.0000 mL | Freq: Once | ORAL | Status: AC
  Administered 2014-11-25: 30 mL via ORAL
  Filled 2014-11-25: qty 30

## 2014-11-25 MED ORDER — WARFARIN SODIUM 5 MG PO TABS
5.0000 mg | ORAL_TABLET | Freq: Once | ORAL | Status: AC
  Administered 2014-11-25: 5 mg via ORAL
  Filled 2014-11-25: qty 1

## 2014-11-25 MED ORDER — WARFARIN - PHARMACIST DOSING INPATIENT: Freq: Every day | Status: DC

## 2014-11-25 NOTE — Discharge Instructions (Signed)
Information on my medicine - Coumadin   (Warfarin)  This medication education was reviewed with me or my healthcare representative as part of my discharge preparation.  The pharmacist that spoke with me during my hospital stay was:  Rudean Haskell, [facility]  Why was Coumadin prescribed for you? Coumadin was prescribed for you because you have a blood clot or a medical condition that can cause an increased risk of forming blood clots. Blood clots can cause serious health problems by blocking the flow of blood to the heart, lung, or brain. Coumadin can prevent harmful blood clots from forming. As a reminder your indication for Coumadin is:   Select from menu  What test will check on my response to Coumadin? While on Coumadin (warfarin) you will need to have an INR test regularly to ensure that your dose is keeping you in the desired range. The INR (international normalized ratio) number is calculated from the result of the laboratory test called prothrombin time (PT).  If an INR APPOINTMENT HAS NOT ALREADY BEEN MADE FOR YOU please schedule an appointment to have this lab work done by your health care provider within 7 days. Your INR goal is usually a number between:  2 to 3 or your provider may give you a more narrow range like 2-2.5.  Ask your health care provider during an office visit what your goal INR is.  What  do you need to  know  About  COUMADIN? Take Coumadin (warfarin) exactly as prescribed by your healthcare provider about the same time each day.  DO NOT stop taking without talking to the doctor who prescribed the medication.  Stopping without other blood clot prevention medication to take the place of Coumadin may increase your risk of developing a new clot or stroke.  Get refills before you run out.  What do you do if you miss a dose? If you miss a dose, take it as soon as you remember on the same day then continue your regularly scheduled regimen the next day.  Do not take two doses of  Coumadin at the same time.  Important Safety Information A possible side effect of Coumadin (Warfarin) is an increased risk of bleeding. You should call your healthcare provider right away if you experience any of the following: ? Bleeding from an injury or your nose that does not stop. ? Unusual colored urine (red or dark brown) or unusual colored stools (red or black). ? Unusual bruising for unknown reasons. ? A serious fall or if you hit your head (even if there is no bleeding).  Some foods or medicines interact with Coumadin (warfarin) and might alter your response to warfarin. To help avoid this: ? Eat a balanced diet, maintaining a consistent amount of Vitamin K. ? Notify your provider about major diet changes you plan to make. ? Avoid alcohol or limit your intake to 1 drink for women and 2 drinks for men per day. (1 drink is 5 oz. wine, 12 oz. beer, or 1.5 oz. liquor.)  Make sure that ANY health care provider who prescribes medication for you knows that you are taking Coumadin (warfarin).  Also make sure the healthcare provider who is monitoring your Coumadin knows when you have started a new medication including herbals and non-prescription products.  Coumadin (Warfarin)  Major Drug Interactions  Increased Warfarin Effect Decreased Warfarin Effect  Alcohol (large quantities) Antibiotics (esp. Septra/Bactrim, Flagyl, Cipro) Amiodarone (Cordarone) Aspirin (ASA) Cimetidine (Tagamet) Megestrol (Megace) NSAIDs (ibuprofen, naproxen, etc.) Piroxicam (  Feldene) °Propafenone (Rythmol SR) °Propranolol (Inderal) °Isoniazid (INH) °Posaconazole (Noxafil) Barbiturates (Phenobarbital) °Carbamazepine (Tegretol) °Chlordiazepoxide (Librium) °Cholestyramine (Questran) °Griseofulvin °Oral Contraceptives °Rifampin °Sucralfate (Carafate) °Vitamin K  ° °Coumadin® (Warfarin) Major Herbal Interactions  °Increased Warfarin Effect Decreased Warfarin Effect  °Garlic °Ginseng °Ginkgo biloba Coenzyme Q10 °Green  tea °St. John’s wort   ° °Coumadin® (Warfarin) FOOD Interactions  °Eat a consistent number of servings per week of foods HIGH in Vitamin K °(1 serving = ½ cup)  °Collards (cooked, or boiled & drained) °Kale (cooked, or boiled & drained) °Mustard greens (cooked, or boiled & drained) °Parsley *serving size only = ¼ cup °Spinach (cooked, or boiled & drained) °Swiss chard (cooked, or boiled & drained) °Turnip greens (cooked, or boiled & drained)  °Eat a consistent number of servings per week of foods MEDIUM-HIGH in Vitamin K °(1 serving = 1 cup)  °Asparagus (cooked, or boiled & drained) °Broccoli (cooked, boiled & drained, or raw & chopped) °Brussel sprouts (cooked, or boiled & drained) *serving size only = ½ cup °Lettuce, raw (green leaf, endive, romaine) °Spinach, raw °Turnip greens, raw & chopped  ° °These websites have more information on Coumadin (warfarin):  www.coumadin.com; °www.ahrq.gov/consumer/coumadin.htm; ° ° ° °

## 2014-11-25 NOTE — Progress Notes (Signed)
ANTICOAGULATION CONSULT NOTE - Follow Up Consult  Pharmacy Consult for heparin Indication: new DVT  Allergies  Allergen Reactions  . Amlodipine Swelling  . Ciprofloxacin Hives and Other (See Comments)    REACTION: weakness  . Statins Other (See Comments)  . Aspirin Swelling    Mouth swelling, tongue  . Cephalexin Hives  . Hydralazine Nausea And Vomiting  . Iron Nausea And Vomiting  . Sulfonamide Derivatives Hives  . Penicillins Other (See Comments)    Reaction is unknown    Patient Measurements: Height: 5' 8.5" (174 cm) Weight: 154 lb 5.2 oz (70 kg) IBW/kg (Calculated) : 65.05 Heparin Dosing Weight: 66 kg  Vital Signs: Temp: 98 F (36.7 C) (06/24 1203) Temp Source: Oral (06/24 1203) BP: 164/67 mmHg (06/24 1203) Pulse Rate: 79 (06/24 1203)  Labs:  Recent Labs  11/23/14 0730 11/23/14 2148 11/24/14 0555 11/24/14 0700 11/25/14 0650 11/25/14 1605  HGB 10.2* 9.8* 9.2*  --  8.5*  --   HCT 32.7* 30.6* 29.1*  --  26.5*  --   PLT 336 323 278  --  240  --   APTT 36  --   --  35  --   --   LABPROT 15.6*  --   --  16.8*  --   --   INR 1.22  --   --  1.35  --   --   HEPARINUNFRC  --   --   --   --  0.12* 0.10*  CREATININE  --  3.31* 3.09*  --   --   --     Estimated Creatinine Clearance: 15.2 mL/min (by C-G formula based on Cr of 3.09).  Assessment: Patient's a 81 y.o F with stage 4 metastatic lung cancer and R paraspinal mass (s/p biopsy on 6/22). She also has a new right hydronephrosis -- s/p R perc nephrostomy placement on 6/23. She c/o RLE edema on admission and Doppler doppler showed new BL DVT. Heparin drip started 6 hours after perc nephrostomy drain placement for thrombosis.  PM, 11/25/2014  Heparin level still subtherapeutic despite increase in heparin rate from 900 to 1050 units/hr this AM  Urine noted to be brown color per RN but no other problems per RN  Goal of Therapy:  Heparin level 0.3-0.7 units/ml Monitor platelets by anticoagulation protocol:  Yes   Plan:  1) Increase IV heparin from current rate of 1050 units/hr to 1400 units/hr 2) Will recheck heparin level 8 hours after rate change   Adrian Saran, PharmD, BCPS Pager [phone] 11/25/2014 4:56 PM

## 2014-11-25 NOTE — Progress Notes (Signed)
ANTICOAGULATION CONSULT NOTE - Follow Up Consult  Pharmacy Consult for heparin Indication: new DVT  Allergies  Allergen Reactions  . Amlodipine Swelling  . Ciprofloxacin Hives and Other (See Comments)    REACTION: weakness  . Statins Other (See Comments)  . Aspirin Swelling    Mouth swelling, tongue  . Cephalexin Hives  . Hydralazine Nausea And Vomiting  . Iron Nausea And Vomiting  . Sulfonamide Derivatives Hives  . Penicillins Other (See Comments)    Reaction is unknown    Patient Measurements: Height: '5\' 8"'$  (172.7 cm) Weight: 145 lb (65.772 kg) IBW/kg (Calculated) : 63.9 Heparin Dosing Weight: 66 kg  Vital Signs: Temp: 97.8 F (36.6 C) (06/24 0524) Temp Source: Oral (06/24 0524) BP: 139/54 mmHg (06/24 0524) Pulse Rate: 65 (06/24 0524)  Labs:  Recent Labs  11/23/14 0730 11/23/14 2148 11/24/14 0555 11/24/14 0700 11/25/14 0650  HGB 10.2* 9.8* 9.2*  --  8.5*  HCT 32.7* 30.6* 29.1*  --  26.5*  PLT 336 323 278  --  240  APTT 36  --   --  35  --   LABPROT 15.6*  --   --  16.8*  --   INR 1.22  --   --  1.35  --   HEPARINUNFRC  --   --   --   --  0.12*  CREATININE  --  3.31* 3.09*  --   --     Estimated Creatinine Clearance: 14.9 mL/min (by C-G formula based on Cr of 3.09).  Assessment: Patient's a 81 y.o F with stage 4 metastatic lung cancer and R paraspinal mass (s/p biopsy on 6/22). She also has a new right hydronephrosis -- s/p R perc nephrostomy placement on 6/23. She c/o RLE edema on admission and Doppler doppler showed new BL DVT. Heparin drip started 6 hours after perc nephrostomy drain placement for thrombosis.  First heparin level now back sub-therapeutic at 0.12.  Hgb down slightly to 8.5, plt ok.  Per RN, some blood noted in urine yesterday but it's clearing this morning.  Goal of Therapy:  Heparin level 0.3-0.7 units/ml Monitor platelets by anticoagulation protocol: Yes   Plan:  - increase heparin drip to 1050 units/hr - recheck another 8  hour level to assess new regimen - monitor for s/s bleeding  Yexalen Deike P 11/25/2014,7:31 AM

## 2014-11-25 NOTE — Progress Notes (Signed)
Subjective: Patient reports she is in no pain. Percutaneous tube placement yesterday went well.  Objective: Vital signs in last 24 hours: Temp:  [97.8 F (36.6 C)-101 F (38.3 C)] 97.8 F (36.6 C) (06/24 0524) Pulse Rate:  [65-87] 65 (06/24 0524) Resp:  [14-20] 14 (06/24 0524) BP: (122-179)/(48-84) 139/54 mmHg (06/24 0524) SpO2:  [95 %-100 %] 96 % (06/24 0524)  Intake/Output from previous day: 06/23 0701 - 06/24 0700 In: 876.3 [I.V.:876.3] Out: 350 [Urine:350] Intake/Output this shift: Total I/O In: 876.3 [I.V.:876.3] Out: 275 [Urine:275]  Physical Exam:  Constitutional: Vital signs reviewed. WD WN in NAD   Eyes: PERRL, No scleral icterus.   Pulmonary/Chest: Normal effort   Lab Results:  Recent Labs  11/23/14 0730 11/23/14 2148 11/24/14 0555  HGB 10.2* 9.8* 9.2*  HCT 32.7* 30.6* 29.1*   BMET  Recent Labs  11/23/14 2148 11/24/14 0555  NA 131* 132*  K 3.9 4.1  CL 95* 96*  CO2 21* 20*  GLUCOSE 174* 121*  BUN 58* 55*  CREATININE 3.31* 3.09*  CALCIUM 8.5* 8.4*    Recent Labs  11/23/14 0730 11/24/14 0700  INR 1.22 1.35   No results for input(s): LABURIN in the last 72 hours. Results for orders placed or performed during the hospital encounter of 11/23/14  Blood culture (routine x 2)     Status: None (Preliminary result)   Collection Time: 11/24/14  1:22 AM  Result Value Ref Range Status   Specimen Description BLOOD LEFT FOREARM  Final   Special Requests BOTTLES DRAWN AEROBIC AND ANAEROBIC 5CC  Final   Culture  Setup Time   Final    GRAM NEGATIVE RODS ANAEROBIC BOTTLE ONLY CRITICAL RESULT CALLED TO, READ BACK BY AND VERIFIED WITH: Lynnae January RN 2200 11/24/14 A BROWNING CONFIRMED BY M CAMPBELL    Culture   Final    GRAM NEGATIVE RODS AER RCV TO MGREGSON(RN) BY TCLEVELAND 11/25/14 AT 3:02AM CONFIRMED BY MCAMPBELL Performed at [facility]    Report Status PENDING  Incomplete  Blood culture (routine x 2)     Status: None (Preliminary  result)   Collection Time: 11/24/14  1:22 AM  Result Value Ref Range Status   Specimen Description RIGHT ANTECUBITAL  Final   Special Requests BOTTLES DRAWN AEROBIC AND ANAEROBIC 5CC  Final   Culture  Setup Time   Final    GRAM NEGATIVE RODS ANAEROBIC BOTTLE ONLY CRITICAL RESULT CALLED TO, READ BACK BY AND VERIFIED WITH: Lynnae January RN 2200 11/24/14 A BROWNING CONFIRMED BY M CAMPBELL    Culture   Final    GRAM NEGATIVE RODS AER RCV TO MGREGER(RN) BY TCLEVELAND 11/25/14 AT 3:08AM CONFIRMED BY Felida Performed at [facility]    Report Status PENDING  Incomplete    Studies/Results: Ct Abdomen Pelvis Wo Contrast  11/24/2014   CLINICAL DATA:  Acute onset of fever and weakness. Right lower quadrant tenderness. Recent CT-guided biopsy of right paraspinal mass. Initial encounter.  EXAM: CT ABDOMEN AND PELVIS WITHOUT CONTRAST  TECHNIQUE: Multidetector CT imaging of the abdomen and pelvis was performed following the standard protocol without IV contrast.  COMPARISON:  CT of the abdomen and pelvis performed 06/23/2013, and lumbar spine CT performed 11/08/2014  FINDINGS: Mild bibasilar atelectasis is noted. This is somewhat nodular at the left lung base, measuring 1.0 cm. Diffuse coronary artery calcifications are seen.  The liver and spleen are unremarkable in appearance. The patient is status post cholecystectomy, with clips noted at the gallbladder fossa. The  pancreas and adrenal glands are unremarkable.  There is mild right-sided hydronephrosis, with prominence of the proximal to mid right ureter. This appears to reflect partial encasement of the mid right ureter by the patient's large right paraspinal mass.  Nonspecific perinephric stranding is noted on the right. A few scattered hypodensities and hyperdensities within the kidneys likely reflect cysts. No renal or ureteral stones are identified. There is mild bilateral renal scarring.  No free fluid is identified. The small bowel is  unremarkable in appearance. The stomach is within normal limits. No acute vascular abnormalities are seen. Diffuse calcification is seen along the abdominal aorta and its branches.  The patient is status post appendectomy. Scattered diverticulosis is noted at the splenic flexure of the colon, and along the descending and sigmoid colon, without evidence of diverticulitis.  The bladder is mildly distended and grossly unremarkable. The patient is status post hysterectomy. No suspicious adnexal masses are seen. No inguinal lymphadenopathy is seen.  There is a large 12.0 x 8.1 x 6.0 cm mass arising along the right side of the lower lumbar spine, with diffuse erosion into vertebral bodies at L4, L5 and S1, obscuring the exiting nerve roots on the right at these levels. No definite extension into the spinal canal is yet seen. The mass underlies the right psoas muscle and tracks about the distal IVC and right common iliac vein. There is diffuse encasement of the right internal iliac artery. Mass extends inferiorly medial to the right iliacus muscle.  No additional osseous abnormalities are identified.  IMPRESSION: 1. Large 12.0 x 8.1 x 6.0 cm right paraspinal mass is likely mildly increased in size from recent prior CT, with diffuse erosion into the vertebral bodies at L4, L5 and S1, obscuring the exiting nerve roots on the right at these levels. No definite extension into the spinal canal. Mass underlies the right psoas muscle and tracks about the distal SVC and right common iliac vein. Diffuse encasement of the right internal iliac artery. 2. Mild right-sided hydronephrosis, with prominence of the proximal to mid right ureter. This appears to reflect partial encasement of the mid right ureter by the large right paraspinal mass. 3. Nodule at the left lung base, measuring 1.0 cm, of uncertain significance. This is apparently new from 71. Mild underlying bibasilar atelectasis noted. 4. Few scattered bilateral renal cysts.  Mild bilateral renal scarring noted. 5. Diffuse calcification along the abdominal aorta and its branches. 6. Scattered diverticulosis of the splenic flexure of the colon, and along the descending and sigmoid colon, without evidence of diverticulitis.   Electronically Signed   By: Garald Balding M.D.   On: 11/24/2014 00:31   Ct Biopsy  11/23/2014   INDICATION: History of lung cancer, now with infiltrative right-sided paraspinal mass adjacent to the caudal aspect of the lumbar spine extending to involve the lumbar sacral junction. Please perform CT-guided biopsy for tissue diagnostic purposes  EXAM: CT-GUIDED BIOPSY OF INFILTRATIVE RIGHT PARASPINAL MASS.  COMPARISON:  Lumbar spine CT - 11/08/2014  MEDICATIONS: Fentanyl 25 mcg IV; Versed 1 mg IV  ANESTHESIA/SEDATION: Sedation time  8 minutes  CONTRAST:  None  COMPLICATIONS: None immediate  PROCEDURE: Informed consent was obtained from the patient following an explanation of the procedure, risks, benefits and alternatives. A time out was performed prior to the initiation of the procedure.  The patient was positioned prone on the CT table and a limited CT was performed for procedural planning demonstrating unchanged appearance of the note expansile paraspinal mass adjacent to  the caudal aspect of the lumbar spine measuring at least 5.6 x 7.3 cm in diameter (image 16, series 2). The procedure was planned. The operative site was prepped and draped in the usual sterile fashion. Appropriate trajectory was confirmed with a 22 gauge spinal needle after the adjacent tissues were anesthetized with 1% Lidocaine with epinephrine.  Under intermittent CT guidance, a 17 gauge coaxial needle was advanced into the peripheral aspect of the mass. Appropriate positioning was confirmed and 6 core needle biopsy samples were obtained with an 18 gauge core needle biopsy device. The co-axial needle was removed and hemostasis was achieved with manual compression.  A limited postprocedural CT  was negative for hemorrhage or additional complication. A dressing was placed. The patient tolerated the procedure well without immediate postprocedural complication.  IMPRESSION: Technically successful CT guided core needle biopsy of expansile right-sided paraspinal mass adjacent to the caudal aspect of the lumbar spine.   Electronically Signed   By: Sandi Mariscal M.D.   On: 11/23/2014 10:13   Dg Chest Port 1 View  11/24/2014   CLINICAL DATA:  Fever.  Possible sepsis.  Initial encounter.  EXAM: PORTABLE CHEST - 1 VIEW  COMPARISON:  Chest radiograph performed 07/29/2014, and CT of the chest performed 06/27/2014  FINDINGS: The lungs are well-aerated. Vascular congestion is noted, with mild bibasilar scarring. There is no evidence of pleural effusion or pneumothorax. Known left-sided pulmonary nodules are partially characterized on radiograph.  The cardiomediastinal silhouette is mildly enlarged. No acute osseous abnormalities are seen.  IMPRESSION: 1. Vascular congestion and mild cardiomegaly, with mild bibasilar scarring. No definite evidence of pneumonia. 2. Known left-sided pulmonary nodules are partially characterized on radiograph.   Electronically Signed   By: Garald Balding M.D.   On: 11/24/2014 06:59   Ir Nephrostomy Placement Right  11/24/2014   CLINICAL DATA:  81 year old with a right paraspinal mass, mild right hydronephrosis and urinary infection. Request for percutaneous nephrostomy tube.  EXAM: RIGHT PERCUTANEOUS NEPHROSTOMY TUBE PLACEMENT WITH ULTRASOUND AND FLUOROSCOPIC GUIDANCE  Physician: Stephan Minister. Henn, MD  FLUOROSCOPY TIME:  5 minutes and 6 seconds.  37  mGy  MEDICATIONS: 0.5 mg Versed, 25 mcg fentanyl. A radiology nurse monitored the patient for moderate sedation.  Patient is already receiving antibiotics and additional antibiotics were not given for this procedure.  ANESTHESIA/SEDATION: Moderate sedation time: 26 minutes  PROCEDURE: Informed consent was obtained for a percutaneous nephrostomy  tube. Patient was placed prone. The right kidney was identified with ultrasound. The right flank was prepped and draped in sterile fashion. Maximal barrier sterile technique was utilized including caps, mask, sterile gowns, sterile gloves, sterile drape, hand hygiene and skin antiseptic. Skin was anesthetized with 1% lidocaine. A 22 gauge needle was directed into the right mid/lower pole collecting system with ultrasound guidance. Contrast injection confirmed placement in the renal collecting system. A 0.018 wire was advanced. An Accustick dilator set was placed. Tract was dilated to accommodate a 10.2 [place] multipurpose drain. The catheter was reconstituted in the renal pelvis. Contrast injection confirmed appropriate positioning. Catheter was attached to a gravity bag and sutured in place. Bandage was placed at the catheter site. Fluoroscopic and ultrasound images were taken and saved for documentation.  FINDINGS: Mild dilatation of the right renal collecting system. Access was obtained from the mid/lower pole. Small amount of contrast was draining distal to the obstructing lesion.  Estimated blood loss: Minimal  COMPLICATIONS: None  IMPRESSION: Successful placement of a right percutaneous nephrostomy tube with ultrasound and fluoroscopic  guidance.   Electronically Signed   By: Markus Daft M.D.   On: 11/24/2014 17:11    Assessment/Plan:   Right hydronephrosis, secondary to retroperitoneal/pelvic malignancy. Currently, this is being drained with a percutaneous nephrostomy tube. Some contrast did get by this large mass into the distal ureter.    Presently, I would recommend just leaving the percutaneous tube in. She has a rapidly progressive process in her retroperitoneum/pelvis, and I think at this point it would be judicious just to leave this in place. It can be internalized a later date.    I will set up an appointment to see her in our [place] office next 2-3 weeks.    Call our practice for  any concerns over the weekend.   LOS: 1 day   Jorja Loa 11/25/2014, 6:33 AM

## 2014-11-25 NOTE — Progress Notes (Signed)
ANTICOAGULATION CONSULT NOTE - Initial Consult  Pharmacy Consult for Warfarin Indication:  VTE treatment   Allergies  Allergen Reactions  . Amlodipine Swelling  . Ciprofloxacin Hives and Other (See Comments)    REACTION: weakness  . Statins Other (See Comments)  . Aspirin Swelling    Mouth swelling, tongue  . Cephalexin Hives  . Hydralazine Nausea And Vomiting  . Iron Nausea And Vomiting  . Sulfonamide Derivatives Hives  . Penicillins Other (See Comments)    Reaction is unknown    Patient Measurements: Height: 5' 8.5" (174 cm) Weight: 154 lb 5.2 oz (70 kg) IBW/kg (Calculated) : 65.05 Heparin Dosing Weight:   Vital Signs: Temp: 98 F (36.7 C) (06/24 1203) Temp Source: Oral (06/24 1203) BP: 164/67 mmHg (06/24 1203) Pulse Rate: 79 (06/24 1203)  Labs:  Recent Labs  11/23/14 0730 11/23/14 2148 11/24/14 0555 11/24/14 0700 11/25/14 0650  HGB 10.2* 9.8* 9.2*  --  8.5*  HCT 32.7* 30.6* 29.1*  --  26.5*  PLT 336 323 278  --  240  APTT 36  --   --  35  --   LABPROT 15.6*  --   --  16.8*  --   INR 1.22  --   --  1.35  --   HEPARINUNFRC  --   --   --   --  0.12*  CREATININE  --  3.31* 3.09*  --   --     Estimated Creatinine Clearance: 15.2 mL/min (by C-G formula based on Cr of 3.09).   Medical History: Past Medical History  Diagnosis Date  . Hypertension   . Hypercholesterolemia   . CKD (chronic kidney disease)   . Coronary artery disease     a.  LHC (06/04/05): LHC done in [place] with high grade RCA => s/p BMS to RCA;  b.  Nuclear (09/14/09): Lexiscan; Inf infarct with mild peri-infarct ishemia, EF 52%; Low Risk.  Marland Kitchen GERD (gastroesophageal reflux disease)   . Chronic anemia   . Ischemic cardiomyopathy     a. Echo (07/26/13): Mild LVH, EF 35-40%, diff HK, inf AK, Gr 2 DD, Tr AI, mildly dilated Ao root, MAC, mild MR, mild LAE, mod reduced RVSF.  . Non-small cell carcinoma of lung     Stage IV   Assessment: 52 yoF with stage 4 metastatic lung cancer and  R paraspinal mass (s/p biopsy on 6/22). She also has a new right hydronephrosis -- s/p R perc nephrostomy placement on 6/23. She c/o RLE edema on admission and Doppler doppler showed new BL DVT. Heparin drip started 6 hours after perc nephrostomy drain placement for thrombosis. Pt now to bridge to warfarin therapy.  Not candidate for NOACs or lovenox due to CKD-IV.    6/24:  Baseline PT/INR normal.    Hgb trending down, today 8.5.    Plts WNL.    Remains on IV heparin at 1050 units/hr with SCDs.    Ate 30% of breakfast.   Goal of Therapy:  INR 2-3 Heparin level 0.3-0.7 units/ml Monitor platelets by anticoagulation protocol: Yes   Plan:  Warfarin '5mg'$  po x 1 tonight Daily PT/INR Warfarin video and booklet Warfarin education prior to discharge  Ralene Bathe, PharmD, BCPS 11/25/2014, 12:27 PM  Pager: [phone]

## 2014-11-25 NOTE — Progress Notes (Signed)
Spoke with md regarding r/o cdiff. Pt has had NO bm for 2days. Will d/c order.

## 2014-11-25 NOTE — Progress Notes (Addendum)
[facility] PROGRESS NOTE  KAILEA DANNEMILLER KVQ:[id number] DOB: 04-25-1934 DOA: 11/23/2014 PCP: Delphina Cahill, MD   Chief Complaint: Fever, chills.  HPI: [name] is a 81 y.o. female with history of stage IV lung cancer had been on observation for the last 9 years with no evidence for disease recurrence, was recently diagnosed with a right paraspinal mass and had undergone biopsy 6/22 per IR set up through Dr.Mohamed,  following which patient was discharged home and patient started experiencing fever chills. Since the fever chills persisted patient came back to the ER, she was also  complaining of some right sided flank pain. CT abdomen and pelvis shows mild hydronephrosis with tumor encasing the ureter. UA was showing features consistent with UTI. Patient has been admitted for further management of UTI with possible developing sepsis. Patient has been having increasing right lower extremity edema.    Assessment/Plan: 1. GNR Sepsis -etiology felt to be UTI,  -Blood Cx with GNR and urine Cx with GNR, continue Primaxin at this time due to multiple allergies, cut down IVF -right sided mild hydronephrosis with tumor encasing the ureter and abnormal UA, Urology consulting,  -s/p  perc nephrostomy drain per IR 6/23, no plans for internalization of this now, per Urology leave this drain in now due to growing R paraspinal mass -repeat Blood CX today  2. Large R paraspinal mass -mets from Stage 4 Lung CA vs new primary, suspect former -stage IV non-small cell lung cancer diagnosed in March 2007 status post 6 cycles of chemotherapy and had been on observation for the last 9 years with no evidence for disease recurrence, until this large mass noted  -s/p CT guided biopsy of lytic expansile mass 6/22 -monitor for neuro symptoms -Path pending, notify Dr.Mohamed on Monday, he is out of office today  3. AKI on CKD 4 -due to sepsis -baseline creatinine around 2.8 -improving and close to  baseline now -bmet in am  4. Bilateral DVTs /swelling R>L -swelling also due to decreased lymphatic and venous return resulting from large mass -continue IV Heparin, start warfarin-discussed risks with pt and daughter -not candidate for NOACs or Lovenox due to CKD4  5. H/o CAD -stable, h/o DES to RCA in 2007 -hold plavix, continue Toprol  6. Chronic diastolic CHF -stable, lasix on hold, resume tomorrow -cut down IVF  7. HTN -stable, cut down clonidine dose   8. Anemia -due to CKD and malignancy, transfuse if <7  Code Status: Full Code Family Communication: daughter at bedside, discussed with daughter that i didn't feel that she should be living alone with all the above problems  Disposition Plan: home when stable   Consultants: Urology  HPI/Subjective: Feels better overall, s/p drain yetserday  Objective: Filed Vitals:   11/25/14 1024  BP: 136/57  Pulse: 65  Temp: 98.2 F (36.8 C)  Resp: 14    Intake/Output Summary (Last 24 hours) at 11/25/14 1201 Last data filed at 11/25/14 1135  Gross per 24 hour  Intake 1116.25 ml  Output    550 ml  Net 566.25 ml   Filed Weights   11/24/14 0440  Weight: 65.772 kg (145 lb)    Exam:   General: AAOx3, no distress, elderly female, pleasant, thinly built  Cardiovascular: S1S2/RRR  Respiratory: fine basilar crackles  Abdomen: soft, NT, BS present  Musculoskeletal: RLE 2plus edema, trace in Left   Data Reviewed: Basic Metabolic Panel:  Recent Labs Lab 11/23/14 2148 11/24/14 0555  NA 131* 132*  K 3.9  4.1  CL 95* 96*  CO2 21* 20*  GLUCOSE 174* 121*  BUN 58* 55*  CREATININE 3.31* 3.09*  CALCIUM 8.5* 8.4*   Liver Function Tests:  Recent Labs Lab 11/23/14 2148 11/24/14 0555  AST 22 21  ALT 11* 10*  ALKPHOS 117 103  BILITOT 0.6 0.7  PROT 7.3 6.4*  ALBUMIN 3.0* 2.5*   No results for input(s): LIPASE, AMYLASE in the last 168 hours. No results for input(s): AMMONIA in the last 168  hours. CBC:  Recent Labs Lab 11/23/14 0730 11/23/14 2148 11/24/14 0555 11/25/14 0650  WBC 18.5* 18.0* 14.3* 10.5  NEUTROABS  --  16.5* 12.6*  --   HGB 10.2* 9.8* 9.2* 8.5*  HCT 32.7* 30.6* 29.1* 26.5*  MCV 82.2 81.6 81.5 81.5  PLT 336 323 278 240   Cardiac Enzymes: No results for input(s): CKTOTAL, CKMB, CKMBINDEX, TROPONINI in the last 168 hours. BNP (last 3 results) No results for input(s): BNP in the last 8760 hours.  ProBNP (last 3 results) No results for input(s): PROBNP in the last 8760 hours.  CBG:  Recent Labs Lab 11/24/14 1251  GLUCAP 123*    Recent Results (from the past 240 hour(s))  Urine culture     Status: None (Preliminary result)   Collection Time: 11/23/14  8:22 AM  Result Value Ref Range Status   Specimen Description URINE, CLEAN CATCH  Final   Special Requests NONE  Final   Culture   Final    >=100,000 COLONIES/mL GRAM NEGATIVE RODS Performed at [facility]    Report Status PENDING  Incomplete  Blood culture (routine x 2)     Status: None (Preliminary result)   Collection Time: 11/24/14  1:22 AM  Result Value Ref Range Status   Specimen Description BLOOD LEFT FOREARM  Final   Special Requests BOTTLES DRAWN AEROBIC AND ANAEROBIC 5CC  Final   Culture  Setup Time   Final    GRAM NEGATIVE RODS IN BOTH AEROBIC AND ANAEROBIC BOTTLES CRITICAL RESULT CALLED TO, READ BACK BY AND VERIFIED WITH: Lynnae January RN 2200 11/24/14 A BROWNING CONFIRMED BY M CAMPBELL    Culture   Final    GRAM NEGATIVE RODS Performed at [facility]    Report Status PENDING  Incomplete  Blood culture (routine x 2)     Status: None (Preliminary result)   Collection Time: 11/24/14  1:22 AM  Result Value Ref Range Status   Specimen Description RIGHT ANTECUBITAL  Final   Special Requests BOTTLES DRAWN AEROBIC AND ANAEROBIC 5CC  Final   Culture  Setup Time   Final    GRAM NEGATIVE RODS ANAEROBIC BOTTLE ONLY CRITICAL RESULT CALLED TO, READ BACK BY AND VERIFIED  WITH: Lynnae January RN 2200 11/24/14 A BROWNING CONFIRMED BY M CAMPBELL    Culture   Final    GRAM NEGATIVE RODS Performed at [facility]    Report Status PENDING  Incomplete     Studies: Ct Abdomen Pelvis Wo Contrast  11/24/2014   CLINICAL DATA:  Acute onset of fever and weakness. Right lower quadrant tenderness. Recent CT-guided biopsy of right paraspinal mass. Initial encounter.  EXAM: CT ABDOMEN AND PELVIS WITHOUT CONTRAST  TECHNIQUE: Multidetector CT imaging of the abdomen and pelvis was performed following the standard protocol without IV contrast.  COMPARISON:  CT of the abdomen and pelvis performed 06/23/2013, and lumbar spine CT performed 11/08/2014  FINDINGS: Mild bibasilar atelectasis is noted. This is somewhat nodular at the left lung  base, measuring 1.0 cm. Diffuse coronary artery calcifications are seen.  The liver and spleen are unremarkable in appearance. The patient is status post cholecystectomy, with clips noted at the gallbladder fossa. The pancreas and adrenal glands are unremarkable.  There is mild right-sided hydronephrosis, with prominence of the proximal to mid right ureter. This appears to reflect partial encasement of the mid right ureter by the patient's large right paraspinal mass.  Nonspecific perinephric stranding is noted on the right. A few scattered hypodensities and hyperdensities within the kidneys likely reflect cysts. No renal or ureteral stones are identified. There is mild bilateral renal scarring.  No free fluid is identified. The small bowel is unremarkable in appearance. The stomach is within normal limits. No acute vascular abnormalities are seen. Diffuse calcification is seen along the abdominal aorta and its branches.  The patient is status post appendectomy. Scattered diverticulosis is noted at the splenic flexure of the colon, and along the descending and sigmoid colon, without evidence of diverticulitis.  The bladder is mildly distended and grossly  unremarkable. The patient is status post hysterectomy. No suspicious adnexal masses are seen. No inguinal lymphadenopathy is seen.  There is a large 12.0 x 8.1 x 6.0 cm mass arising along the right side of the lower lumbar spine, with diffuse erosion into vertebral bodies at L4, L5 and S1, obscuring the exiting nerve roots on the right at these levels. No definite extension into the spinal canal is yet seen. The mass underlies the right psoas muscle and tracks about the distal IVC and right common iliac vein. There is diffuse encasement of the right internal iliac artery. Mass extends inferiorly medial to the right iliacus muscle.  No additional osseous abnormalities are identified.  IMPRESSION: 1. Large 12.0 x 8.1 x 6.0 cm right paraspinal mass is likely mildly increased in size from recent prior CT, with diffuse erosion into the vertebral bodies at L4, L5 and S1, obscuring the exiting nerve roots on the right at these levels. No definite extension into the spinal canal. Mass underlies the right psoas muscle and tracks about the distal SVC and right common iliac vein. Diffuse encasement of the right internal iliac artery. 2. Mild right-sided hydronephrosis, with prominence of the proximal to mid right ureter. This appears to reflect partial encasement of the mid right ureter by the large right paraspinal mass. 3. Nodule at the left lung base, measuring 1.0 cm, of uncertain significance. This is apparently new from 52. Mild underlying bibasilar atelectasis noted. 4. Few scattered bilateral renal cysts. Mild bilateral renal scarring noted. 5. Diffuse calcification along the abdominal aorta and its branches. 6. Scattered diverticulosis of the splenic flexure of the colon, and along the descending and sigmoid colon, without evidence of diverticulitis.   Electronically Signed   By: Garald Balding M.D.   On: 11/24/2014 00:31   Dg Chest Port 1 View  11/24/2014   CLINICAL DATA:  Fever.  Possible sepsis.  Initial  encounter.  EXAM: PORTABLE CHEST - 1 VIEW  COMPARISON:  Chest radiograph performed 07/29/2014, and CT of the chest performed 06/27/2014  FINDINGS: The lungs are well-aerated. Vascular congestion is noted, with mild bibasilar scarring. There is no evidence of pleural effusion or pneumothorax. Known left-sided pulmonary nodules are partially characterized on radiograph.  The cardiomediastinal silhouette is mildly enlarged. No acute osseous abnormalities are seen.  IMPRESSION: 1. Vascular congestion and mild cardiomegaly, with mild bibasilar scarring. No definite evidence of pneumonia. 2. Known left-sided pulmonary nodules are partially characterized on  radiograph.   Electronically Signed   By: Garald Balding M.D.   On: 11/24/2014 06:59   Ir Nephrostomy Placement Right  11/24/2014   CLINICAL DATA:  81 year old with a right paraspinal mass, mild right hydronephrosis and urinary infection. Request for percutaneous nephrostomy tube.  EXAM: RIGHT PERCUTANEOUS NEPHROSTOMY TUBE PLACEMENT WITH ULTRASOUND AND FLUOROSCOPIC GUIDANCE  Physician: Stephan Minister. Henn, MD  FLUOROSCOPY TIME:  5 minutes and 6 seconds.  37  mGy  MEDICATIONS: 0.5 mg Versed, 25 mcg fentanyl. A radiology nurse monitored the patient for moderate sedation.  Patient is already receiving antibiotics and additional antibiotics were not given for this procedure.  ANESTHESIA/SEDATION: Moderate sedation time: 26 minutes  PROCEDURE: Informed consent was obtained for a percutaneous nephrostomy tube. Patient was placed prone. The right kidney was identified with ultrasound. The right flank was prepped and draped in sterile fashion. Maximal barrier sterile technique was utilized including caps, mask, sterile gowns, sterile gloves, sterile drape, hand hygiene and skin antiseptic. Skin was anesthetized with 1% lidocaine. A 22 gauge needle was directed into the right mid/lower pole collecting system with ultrasound guidance. Contrast injection confirmed placement in the  renal collecting system. A 0.018 wire was advanced. An Accustick dilator set was placed. Tract was dilated to accommodate a 10.2 [place] multipurpose drain. The catheter was reconstituted in the renal pelvis. Contrast injection confirmed appropriate positioning. Catheter was attached to a gravity bag and sutured in place. Bandage was placed at the catheter site. Fluoroscopic and ultrasound images were taken and saved for documentation.  FINDINGS: Mild dilatation of the right renal collecting system. Access was obtained from the mid/lower pole. Small amount of contrast was draining distal to the obstructing lesion.  Estimated blood loss: Minimal  COMPLICATIONS: None  IMPRESSION: Successful placement of a right percutaneous nephrostomy tube with ultrasound and fluoroscopic guidance.   Electronically Signed   By: Markus Daft M.D.   On: 11/24/2014 17:11    Scheduled Meds: . cloNIDine  0.1 mg Oral BID  . imipenem-cilastatin  250 mg Intravenous Q12H  . metoprolol succinate  12.5 mg Oral Daily  . pantoprazole  40 mg Oral Daily  . pneumococcal 23 valent vaccine  0.5 mL Intramuscular Tomorrow-1000   Continuous Infusions: . heparin 1,050 Units/hr (11/25/14 0750)   Antibiotics Given (last 72 hours)    Date/Time Action Medication Dose Rate   11/24/14 0924 Given   vancomycin (VANCOCIN) IVPB 1000 mg/200 mL premix 1,000 mg 200 mL/hr   11/24/14 1817 Given   imipenem-cilastatin (PRIMAXIN) 250 mg in sodium chloride 0.9 % 100 mL IVPB 250 mg 200 mL/hr   11/25/14 0533 Given   imipenem-cilastatin (PRIMAXIN) 250 mg in sodium chloride 0.9 % 100 mL IVPB 250 mg 200 mL/hr      Principal Problem:   Fever Active Problems:   CAD, NATIVE VESSEL   Hydronephrosis of right kidney   UTI (lower urinary tract infection)   Renal failure (ARF), acute on chronic   SIRS (systemic inflammatory response syndrome)   Pressure ulcer   Hydronephrosis    Time spent: 4mn    Arlie Posch  [facility] Pager  [phone] If 7PM-7AM, please contact night-coverage at [email or web address], password [email or web address], 12:01 PM  LOS: 1 day

## 2014-11-26 DIAGNOSIS — A419 Sepsis, unspecified organism: Principal | ICD-10-CM

## 2014-11-26 DIAGNOSIS — N189 Chronic kidney disease, unspecified: Secondary | ICD-10-CM

## 2014-11-26 DIAGNOSIS — C3490 Malignant neoplasm of unspecified part of unspecified bronchus or lung: Secondary | ICD-10-CM

## 2014-11-26 DIAGNOSIS — N1330 Unspecified hydronephrosis: Secondary | ICD-10-CM

## 2014-11-26 DIAGNOSIS — N179 Acute kidney failure, unspecified: Secondary | ICD-10-CM

## 2014-11-26 DIAGNOSIS — R29818 Other symptoms and signs involving the nervous system: Secondary | ICD-10-CM

## 2014-11-26 LAB — URINE CULTURE: Culture: >=100000

## 2014-11-26 LAB — CBC
HEMATOCRIT: 25.3 % — AB (ref 36.0–46.0)
HEMOGLOBIN: 8.1 g/dL — AB (ref 12.0–15.0)
MCH: 25.6 pg — ABNORMAL LOW (ref 26.0–34.0)
MCHC: 32 g/dL (ref 30.0–36.0)
MCV: 80.1 fL (ref 78.0–100.0)
Platelets: 247 10*3/uL (ref 150–400)
RBC: 3.16 MIL/uL — ABNORMAL LOW (ref 3.87–5.11)
RDW: 14.3 % (ref 11.5–15.5)
WBC: 9.9 10*3/uL (ref 4.0–10.5)

## 2014-11-26 LAB — BASIC METABOLIC PANEL
Anion gap: 9 (ref 5–15)
BUN: 62 mg/dL — AB (ref 6–20)
CO2: 22 mmol/L (ref 22–32)
Calcium: 8.1 mg/dL — ABNORMAL LOW (ref 8.9–10.3)
Chloride: 102 mmol/L (ref 101–111)
Creatinine, Ser: 2.94 mg/dL — ABNORMAL HIGH (ref 0.44–1.00)
GFR calc Af Amer: 16 mL/min — ABNORMAL LOW (ref >60)
GFR, EST NON AFRICAN AMERICAN: 14 mL/min — AB (ref >60)
GLUCOSE: 105 mg/dL — AB (ref 65–99)
Potassium: 4.3 mmol/L (ref 3.5–5.1)
Sodium: 133 mmol/L — ABNORMAL LOW (ref 135–145)

## 2014-11-26 LAB — PROTIME-INR
INR: 1.25 (ref 0.00–1.49)
Prothrombin Time: 15.9 seconds — ABNORMAL HIGH (ref 11.6–15.2)

## 2014-11-26 LAB — HEPARIN LEVEL (UNFRACTIONATED)
HEPARIN UNFRACTIONATED: 0.58 [IU]/mL (ref 0.30–0.70)
Heparin Unfractionated: 0.33 IU/mL (ref 0.30–0.70)

## 2014-11-26 MED ORDER — WARFARIN SODIUM 5 MG PO TABS
5.0000 mg | ORAL_TABLET | Freq: Once | ORAL | Status: DC
  Filled 2014-11-26: qty 1

## 2014-11-26 MED ORDER — ENOXAPARIN (LOVENOX) PATIENT EDUCATION KIT
PACK | Freq: Once | Status: DC
  Filled 2014-11-26: qty 1

## 2014-11-26 MED ORDER — ENOXAPARIN SODIUM 80 MG/0.8ML ~~LOC~~ SOLN
1.0000 mg/kg | SUBCUTANEOUS | Status: DC
  Administered 2014-11-26 – 2014-11-28 (×3): 70 mg via SUBCUTANEOUS
  Filled 2014-11-26 (×3): qty 0.8

## 2014-11-26 NOTE — Progress Notes (Addendum)
ANTICOAGULATION CONSULT NOTE - Follow Up  Pharmacy Consult for Heparin, Warfarin Indication:  VTE treatment   Allergies  Allergen Reactions  . Amlodipine Swelling  . Ciprofloxacin Hives and Other (See Comments)    REACTION: weakness  . Statins Other (See Comments)  . Aspirin Swelling    Mouth swelling, tongue  . Cephalexin Hives  . Hydralazine Nausea And Vomiting  . Iron Nausea And Vomiting  . Sulfonamide Derivatives Hives  . Penicillins Other (See Comments)    Reaction is unknown    Patient Measurements: Height: 5' 8.5" (174 cm) Weight: 154 lb 5.2 oz (70 kg) IBW/kg (Calculated) : 65.05 Heparin Dosing Weight: using total body weight  Vital Signs: Temp: 98.3 F (36.8 C) (06/25 0514) Temp Source: Oral (06/25 0514) BP: 144/65 mmHg (06/25 0514) Pulse Rate: 72 (06/25 0514)  Labs:  Recent Labs  11/23/14 2148 11/24/14 0555 11/24/14 0700  11/25/14 0650 11/25/14 1605 11/26/14 0137 11/26/14 1006  HGB 9.8* 9.2*  --   --  8.5*  --  8.1*  --   HCT 30.6* 29.1*  --   --  26.5*  --  25.3*  --   PLT 323 278  --   --  240  --  247  --   APTT  --   --  35  --   --   --   --   --   LABPROT  --   --  16.8*  --   --   --  15.9*  --   INR  --   --  1.35  --   --   --  1.25  --   HEPARINUNFRC  --   --   --   < > 0.12* 0.10* 0.33 0.58  CREATININE 3.31* 3.09*  --   --   --   --  2.94*  --   < > = values in this interval not displayed.  Estimated Creatinine Clearance: 15.9 mL/min (by C-G formula based on Cr of 2.94).   Medical History: Past Medical History  Diagnosis Date  . Hypertension   . Hypercholesterolemia   . CKD (chronic kidney disease)   . Coronary artery disease     a.  LHC (06/04/05): LHC done in [place] with high grade RCA => s/p BMS to RCA;  b.  Nuclear (09/14/09): Lexiscan; Inf infarct with mild peri-infarct ishemia, EF 52%; Low Risk.  Marland Kitchen GERD (gastroesophageal reflux disease)   . Chronic anemia   . Ischemic cardiomyopathy     a. Echo (07/26/13): Mild LVH,  EF 35-40%, diff HK, inf AK, Gr 2 DD, Tr AI, mildly dilated Ao root, MAC, mild MR, mild LAE, mod reduced RVSF.  . Non-small cell carcinoma of lung     Stage IV   Assessment: 51 yoF with stage 4 metastatic lung cancer and R paraspinal mass (s/p biopsy on 6/22). She also has a new right hydronephrosis -- s/p R perc nephrostomy placement on 6/23. She c/o RLE edema on admission and Doppler doppler showed new BL DVT. Heparin drip started 6 hours after perc nephrostomy drain placement for thrombosis. Bridge to warfarin therapy started 6/24.  Not candidate for NOACs or lovenox due to CKD-IV.    Today, 11/26/2014:  Day #2 of warfarin/heparin overlap.  Repeat HL remains therapeutic (0.58) with infusion at 1400 units/hr.  INR 1.25.  Baseline INR a bit elevated at 1.35.    Hgb trending down, today 8.1.  No bleeding noted.  Plts WNL.  CKD-IV  Warfarin education completed 6/24  Goal of Therapy:  INR 2-3 Heparin level 0.3-0.7 units/ml Monitor platelets by anticoagulation protocol: Yes   Plan:  1.  Warfarin '5mg'$  po x 1 tonight. 2.  Continue heparin infusion at 1400 units/hr. 3.  Daily PT/INR, HL, CBC.  Hershal Coria, PharmD, BCPS Pager: [phone] 11/26/2014 10:42 AM

## 2014-11-26 NOTE — Progress Notes (Signed)
Subjective: The patient is seen and examined today. This is a very pleasant 81 years old white female with history of stage IV non-small cell lung cancer diagnosed in March 2007 status post 6 cycles of systemic chemotherapy with carboplatin and docetaxel at that time was almost complete response and has been on observation since August 2007. Unfortunately the patient was found recently on CT scan of the lumbar spine to have a large soft tissue mass at the lumbosacral junction. A recent CT-guided core biopsy showed metastatic squamous cell carcinoma. The patient was admitted recently to [facility] with fever and chills and was found to have urinary tract infection and CT scan of the abdomen and pelvis showed mild hydronephrosis with tumor encasing see later. She underwent right nephrostomy tube placement by interventional radiology. The patient is feeling a little bit better today with no specific complaints. She was a little bit upset with the news that she received from the hospitalist today regarding her prognosis. She denied having any current fever or chills. She denied having any nausea or vomiting. Her back pain is well-controlled.  Objective: Vital signs in last 24 hours: Temp:  [98 F (36.7 C)-98.8 F (37.1 C)] 98.3 F (36.8 C) (06/25 0514) Pulse Rate:  [72-82] 72 (06/25 0514) Resp:  [16] 16 (06/25 0514) BP: (144-168)/(60-67) 144/65 mmHg (06/25 0514) SpO2:  [96 %-98 %] 96 % (06/25 0514) Weight:  [154 lb 5.2 oz (70 kg)] 154 lb 5.2 oz (70 kg) (06/24 1203)  Intake/Output from previous day: 06/24 0701 - 06/25 0700 In: 521.8 [P.O.:360; I.V.:161.8] Out: 800 [Urine:800] Intake/Output this shift:    General appearance: alert, cooperative and no distress Resp: clear to auscultation bilaterally Cardio: regular rate and rhythm, S1, S2 normal, no murmur, click, rub or gallop GI: soft, non-tender; bowel sounds normal; no masses,  no organomegaly Extremities: extremities normal,  atraumatic, no cyanosis or edema  Lab Results:   Recent Labs  11/25/14 0650 11/26/14 0137  WBC 10.5 9.9  HGB 8.5* 8.1*  HCT 26.5* 25.3*  PLT 240 247   BMET  Recent Labs  11/24/14 0555 11/26/14 0137  NA 132* 133*  K 4.1 4.3  CL 96* 102  CO2 20* 22  GLUCOSE 121* 105*  BUN 55* 62*  CREATININE 3.09* 2.94*  CALCIUM 8.4* 8.1*    Studies/Results: Ir Nephrostomy Placement Right  11/24/2014   CLINICAL DATA:  81 year old with a right paraspinal mass, mild right hydronephrosis and urinary infection. Request for percutaneous nephrostomy tube.  EXAM: RIGHT PERCUTANEOUS NEPHROSTOMY TUBE PLACEMENT WITH ULTRASOUND AND FLUOROSCOPIC GUIDANCE  Physician: Stephan Minister. Henn, MD  FLUOROSCOPY TIME:  5 minutes and 6 seconds.  37  mGy  MEDICATIONS: 0.5 mg Versed, 25 mcg fentanyl. A radiology nurse monitored the patient for moderate sedation.  Patient is already receiving antibiotics and additional antibiotics were not given for this procedure.  ANESTHESIA/SEDATION: Moderate sedation time: 26 minutes  PROCEDURE: Informed consent was obtained for a percutaneous nephrostomy tube. Patient was placed prone. The right kidney was identified with ultrasound. The right flank was prepped and draped in sterile fashion. Maximal barrier sterile technique was utilized including caps, mask, sterile gowns, sterile gloves, sterile drape, hand hygiene and skin antiseptic. Skin was anesthetized with 1% lidocaine. A 22 gauge needle was directed into the right mid/lower pole collecting system with ultrasound guidance. Contrast injection confirmed placement in the renal collecting system. A 0.018 wire was advanced. An Accustick dilator set was placed. Tract was dilated to accommodate a 10.2 [place]  multipurpose drain. The catheter was reconstituted in the renal pelvis. Contrast injection confirmed appropriate positioning. Catheter was attached to a gravity bag and sutured in place. Bandage was placed at the catheter site. Fluoroscopic  and ultrasound images were taken and saved for documentation.  FINDINGS: Mild dilatation of the right renal collecting system. Access was obtained from the mid/lower pole. Small amount of contrast was draining distal to the obstructing lesion.  Estimated blood loss: Minimal  COMPLICATIONS: None  IMPRESSION: Successful placement of a right percutaneous nephrostomy tube with ultrasound and fluoroscopic guidance.   Electronically Signed   By: Markus Daft M.D.   On: 11/24/2014 17:11    Medications: I have reviewed the patient's current medications.   Assessment/Plan: 1) metastatic non-small cell lung cancer, squamous cell carcinoma initially diagnosed as a stage IV in March 2007 status post systemic chemotherapy and has been observation since August 2007 with no evidence for disease recurrence until recently. Her recent scan showed a large right lumbosacral paraspinal soft tissue mass that was biopsy-proven to be squamous cell carcinoma. The patient will need complete staging workup with a PET scan as well as MRI of the brain which will be performed an outpatient basis. I recommended for the patient to receive palliative radiotherapy to this area and I will consult radiation oncology for evaluation of her condition. The patient would also benefit from systemic chemotherapy after the palliative radiation. 2) renal insufficiency secondary to hydronephrosis from the mass encasing the right ureter. She status post right nephrostomy tube placement. Hopefully this would improve after the patient received palliative radiotherapy to the paraspinal soft tissue mass encasing the right ureter. Thank you for taking good care of Ms. Sabra Heck, I will continue to follow up the patient with you and assist in her management on as-needed basis.    LOS: 2 days    Jakyria Bleau K. 11/26/2014

## 2014-11-26 NOTE — Progress Notes (Signed)
PATIENT DETAILS Name: [name] Age: 81 y.o. Sex: female Date of Birth: Aug 12, 1933 Admit Date: 11/23/2014 Admitting Physician Rise Patience, MD [id number], [facility], MD  Subjective: Feels much better on admission. Still with significant swelling of the right lower extremity. Does not want to discuss cancer-does not want to be told that she has cancer-claims that Dr. Julien Nordmann knows her very well and he will handle all oncology issues.  Assessment/Plan: Principal Problem: Proteus bacteremia with sepsis: Likely etiology UTI. Continue Primaxin-Will speak with patient/family to see what her exact allergies are before transitioning to an oral agent. CT abdomen does not show any other foci of infection  Active Problems: Large right paraspinal mass: Known stage IV lung cancer and was on observation with close follow-up with oncology. Patient underwent CT-guided biopsy on 6/22, which shows metastatic squamous carcinoma. Spoke with Dr. Julien Nordmann, recommending radiation oncology evaluation.  Bilateral lower extremity DVT: Likely from above mass encasing vessels. Started on IV heparin and overlapping Coumadin-this M.D. spoke with Dr. Mohamed-recommendations were to see if pharmacy could dose Lovenox given active cancer. Begin Lovenox teaching, will consult case management for insurance issues.  Acute on chronic kidney disease stage IV: Acute renal failure likely secondary to hydronephrosis/obstruction. Improving and now close to usual baseline. Continue to follow closely.  Hydronephrosis of the right kidney: Secondary to a large right paraspinal mass encasing the right ureter. Subsequently underwent right percutaneous nephrostomy tube placement. Urology following.  Anemia: Likely secondary to acute acute illness-has anemia of chronic kidney disease/malignancy. Follow and transfuse as needed.  Chronic diastolic heart failure: Compensated. Stop IV fluids. Continue to hold Lasix for  now.  History of CAD: Currently without chest pain or shortness of breath. Has history of DES to RCA in 2007-and was on Plavix prior to admission-this has not been resumed. We will speak with primary cardiologist on 6/27-to see if we can perhaps discontinue-as patient will be on anticoagulation. In the meantime, continue with metoprolol.  Hypertension: Continue clonidine and metoprolol. Follow and titrate accordingly  Disposition: Remain inpatient  Antimicrobial agents  See below  Anti-infectives    Start     Dose/Rate Route Frequency Ordered Stop   11/24/14 1800  imipenem-cilastatin (PRIMAXIN) 250 mg in sodium chloride 0.9 % 100 mL IVPB     250 mg 200 mL/hr over 30 Minutes Intravenous Every 12 hours 11/24/14 0234     11/24/14 0800  vancomycin (VANCOCIN) IVPB 1000 mg/200 mL premix  Status:  Discontinued     1,000 mg 200 mL/hr over 60 Minutes Intravenous Every 48 hours 11/24/14 0700 11/25/14 0410   11/24/14 0145  imipenem-cilastatin (PRIMAXIN) 500 mg in sodium chloride 0.9 % 100 mL IVPB     500 mg 200 mL/hr over 30 Minutes Intravenous  Once 11/24/14 0142 11/24/14 0418      DVT Prophylaxis: On Lovenox   Code Status: Full code   Family Communication None at bedside  Procedures: Percutaneous right nephrostomy on 6/23 CT-guided biopsy of right paraspinal Mass on 6/22  CONSULTS:  hematology/oncology and urology  Time spent 40 minutes-Greater than 50% of this time was spent in counseling, explanation of diagnosis, planning of further management, and coordination of care.  MEDICATIONS: Scheduled Meds: . cloNIDine  0.1 mg Oral BID  . enoxaparin (LOVENOX) injection  1 mg/kg Subcutaneous Q24H  . imipenem-cilastatin  250 mg Intravenous Q12H  . metoprolol succinate  12.5 mg Oral Daily  . pantoprazole  40 mg Oral Daily  . pneumococcal 23 valent vaccine  0.5 mL Intramuscular Tomorrow-1000   Continuous Infusions:  PRN Meds:.acetaminophen **OR** acetaminophen, LORazepam,  morphine injection, ondansetron **OR** ondansetron (ZOFRAN) IV    PHYSICAL EXAM: Vital signs in last 24 hours: Filed Vitals:   11/25/14 1203 11/25/14 2030 11/25/14 2251 11/26/14 0514  BP: 164/67 168/60 150/65 144/65  Pulse: 79 82 80 72  Temp: 98 F (36.7 C) 98.8 F (37.1 C)  98.3 F (36.8 C)  TempSrc: Oral Oral  Oral  Resp: '16 16  16  '$ Height: 5' 8.5" (1.74 m)     Weight: 70 kg (154 lb 5.2 oz)     SpO2: 98% 97%  96%    Weight change:  Filed Weights   11/24/14 0440 11/25/14 1203  Weight: 65.772 kg (145 lb) 70 kg (154 lb 5.2 oz)   Body mass index is 23.12 kg/(m^2).   Gen Exam: Awake and alert with clear speech.   Neck: Supple, No JVD.   Chest: B/L Clear.   CVS: S1 S2 Regular, no murmurs.  Abdomen: soft, BS +, non tender, non distended.  Extremities: no edema, lower extremities warm to touch. Neurologic: Non Focal.   Skin: No Rash.   Wounds: N/A.    Intake/Output from previous day:  Intake/Output Summary (Last 24 hours) at 11/26/14 1220 Last data filed at 11/26/14 0144  Gross per 24 hour  Intake  281.8 ml  Output    600 ml  Net -318.2 ml     LAB RESULTS: CBC  Recent Labs Lab 11/23/14 0730 11/23/14 2148 11/24/14 0555 11/25/14 0650 11/26/14 0137  WBC 18.5* 18.0* 14.3* 10.5 9.9  HGB 10.2* 9.8* 9.2* 8.5* 8.1*  HCT 32.7* 30.6* 29.1* 26.5* 25.3*  PLT 336 323 278 240 247  MCV 82.2 81.6 81.5 81.5 80.1  MCH 25.6* 26.1 25.8* 26.2 25.6*  MCHC 31.2 32.0 31.6 32.1 32.0  RDW 14.0 14.1 14.3 14.4 14.3  LYMPHSABS  --  0.8 0.7  --   --   MONOABS  --  0.7 0.9  --   --   EOSABS  --  0.0 0.0  --   --   BASOSABS  --  0.0 0.0  --   --     Chemistries   Recent Labs Lab 11/23/14 2148 11/24/14 0555 11/26/14 0137  NA 131* 132* 133*  K 3.9 4.1 4.3  CL 95* 96* 102  CO2 21* 20* 22  GLUCOSE 174* 121* 105*  BUN 58* 55* 62*  CREATININE 3.31* 3.09* 2.94*  CALCIUM 8.5* 8.4* 8.1*    CBG:  Recent Labs Lab 11/24/14 1251  GLUCAP 123*    GFR Estimated Creatinine  Clearance: 15.9 mL/min (by C-G formula based on Cr of 2.94).  Coagulation profile  Recent Labs Lab 11/23/14 0730 11/24/14 0700 11/26/14 0137  INR 1.22 1.35 1.25    Cardiac Enzymes No results for input(s): CKMB, TROPONINI, MYOGLOBIN in the last 168 hours.  Invalid input(s): CK  Invalid input(s): POCBNP No results for input(s): DDIMER in the last 72 hours. No results for input(s): HGBA1C in the last 72 hours. No results for input(s): CHOL, HDL, LDLCALC, TRIG, CHOLHDL, LDLDIRECT in the last 72 hours. No results for input(s): TSH, T4TOTAL, T3FREE, THYROIDAB in the last 72 hours.  Invalid input(s): FREET3 No results for input(s): VITAMINB12, FOLATE, FERRITIN, TIBC, IRON, RETICCTPCT in the last 72 hours. No results for input(s): LIPASE, AMYLASE in the last 72 hours.  Urine Studies No results for input(s):  UHGB, CRYS in the last 72 hours.  Invalid input(s): UACOL, UAPR, USPG, UPH, UTP, UGL, UKET, UBIL, UNIT, UROB, ULEU, UEPI, UWBC, URBC, UBAC, CAST, UCOM, BILUA  MICROBIOLOGY: Recent Results (from the past 240 hour(s))  Urine culture     Status: None   Collection Time: 11/23/14  8:22 AM  Result Value Ref Range Status   Specimen Description URINE, CLEAN CATCH  Final   Special Requests NONE  Final   Culture   Final    >=100,000 COLONIES/mL PROTEUS MIRABILIS Performed at [facility]    Report Status 11/26/2014 FINAL  Final   Organism ID, Bacteria PROTEUS MIRABILIS  Final      Susceptibility   Proteus mirabilis - MIC*    AMPICILLIN <=2 SENSITIVE Sensitive     CEFAZOLIN <=4 SENSITIVE Sensitive     CEFTRIAXONE <=1 SENSITIVE Sensitive     CIPROFLOXACIN <=0.25 SENSITIVE Sensitive     GENTAMICIN >=16 RESISTANT Resistant     IMIPENEM 2 SENSITIVE Sensitive     NITROFURANTOIN 128 RESISTANT Resistant     TRIMETH/SULFA <=20 SENSITIVE Sensitive     AMPICILLIN/SULBACTAM <=2 SENSITIVE Sensitive     PIP/TAZO <=4 SENSITIVE Sensitive     * >=100,000 COLONIES/mL PROTEUS MIRABILIS    Urine culture     Status: None   Collection Time: 11/23/14 11:24 PM  Result Value Ref Range Status   Specimen Description URINE, RANDOM  Final   Special Requests NONE  Final   Culture   Final    >=100,000 COLONIES/mL PROTEUS MIRABILIS Performed at [facility]    Report Status 11/26/2014 FINAL  Final   Organism ID, Bacteria PROTEUS MIRABILIS  Final      Susceptibility   Proteus mirabilis - MIC*    AMPICILLIN <=2 SENSITIVE Sensitive     CEFAZOLIN <=4 SENSITIVE Sensitive     CEFTRIAXONE <=1 SENSITIVE Sensitive     CIPROFLOXACIN <=0.25 SENSITIVE Sensitive     GENTAMICIN >=16 RESISTANT Resistant     IMIPENEM 2 SENSITIVE Sensitive     NITROFURANTOIN 128 RESISTANT Resistant     TRIMETH/SULFA <=20 SENSITIVE Sensitive     AMPICILLIN/SULBACTAM <=2 SENSITIVE Sensitive     PIP/TAZO <=4 SENSITIVE Sensitive     * >=100,000 COLONIES/mL PROTEUS MIRABILIS  Blood culture (routine x 2)     Status: None (Preliminary result)   Collection Time: 11/24/14  1:22 AM  Result Value Ref Range Status   Specimen Description BLOOD LEFT FOREARM  Final   Special Requests BOTTLES DRAWN AEROBIC AND ANAEROBIC 5CC  Final   Culture  Setup Time   Final    GRAM NEGATIVE RODS IN BOTH AEROBIC AND ANAEROBIC BOTTLES CRITICAL RESULT CALLED TO, READ BACK BY AND VERIFIED WITH: Lynnae January RN 2200 11/24/14 A BROWNING CONFIRMED BY M CAMPBELL    Culture   Final    PROTEUS MIRABILIS Performed at [facility]    Report Status PENDING  Incomplete   Organism ID, Bacteria PROTEUS MIRABILIS  Final      Susceptibility   Proteus mirabilis - MIC*    AMPICILLIN <=2 SENSITIVE Sensitive     CEFAZOLIN <=4 SENSITIVE Sensitive     CEFEPIME <=1 SENSITIVE Sensitive     CEFTAZIDIME <=1 SENSITIVE Sensitive     CEFTRIAXONE <=1 SENSITIVE Sensitive     CIPROFLOXACIN <=0.25 SENSITIVE Sensitive     GENTAMICIN >=16 RESISTANT Resistant     IMIPENEM 0.5 SENSITIVE Sensitive     TRIMETH/SULFA <=20 SENSITIVE Sensitive  AMPICILLIN/SULBACTAM <=2 SENSITIVE Sensitive     PIP/TAZO <=4 SENSITIVE Sensitive     * PROTEUS MIRABILIS  Blood culture (routine x 2)     Status: None (Preliminary result)   Collection Time: 11/24/14  1:22 AM  Result Value Ref Range Status   Specimen Description RIGHT ANTECUBITAL  Final   Special Requests BOTTLES DRAWN AEROBIC AND ANAEROBIC 5CC  Final   Culture  Setup Time   Final    GRAM NEGATIVE RODS ANAEROBIC BOTTLE ONLY CRITICAL RESULT CALLED TO, READ BACK BY AND VERIFIED WITH: Lynnae January RN 2200 11/24/14 A BROWNING CONFIRMED BY M CAMPBELL    Culture   Final    PROTEUS MIRABILIS SUSCEPTIBILITIES PERFORMED ON PREVIOUS CULTURE WITHIN THE LAST 5 DAYS. Performed at [facility]    Report Status PENDING  Incomplete    RADIOLOGY STUDIES/RESULTS: Ct Abdomen Pelvis Wo Contrast  11/24/2014   CLINICAL DATA:  Acute onset of fever and weakness. Right lower quadrant tenderness. Recent CT-guided biopsy of right paraspinal mass. Initial encounter.  EXAM: CT ABDOMEN AND PELVIS WITHOUT CONTRAST  TECHNIQUE: Multidetector CT imaging of the abdomen and pelvis was performed following the standard protocol without IV contrast.  COMPARISON:  CT of the abdomen and pelvis performed 06/23/2013, and lumbar spine CT performed 11/08/2014  FINDINGS: Mild bibasilar atelectasis is noted. This is somewhat nodular at the left lung base, measuring 1.0 cm. Diffuse coronary artery calcifications are seen.  The liver and spleen are unremarkable in appearance. The patient is status post cholecystectomy, with clips noted at the gallbladder fossa. The pancreas and adrenal glands are unremarkable.  There is mild right-sided hydronephrosis, with prominence of the proximal to mid right ureter. This appears to reflect partial encasement of the mid right ureter by the patient's large right paraspinal mass.  Nonspecific perinephric stranding is noted on the right. A few scattered hypodensities and hyperdensities within the  kidneys likely reflect cysts. No renal or ureteral stones are identified. There is mild bilateral renal scarring.  No free fluid is identified. The small bowel is unremarkable in appearance. The stomach is within normal limits. No acute vascular abnormalities are seen. Diffuse calcification is seen along the abdominal aorta and its branches.  The patient is status post appendectomy. Scattered diverticulosis is noted at the splenic flexure of the colon, and along the descending and sigmoid colon, without evidence of diverticulitis.  The bladder is mildly distended and grossly unremarkable. The patient is status post hysterectomy. No suspicious adnexal masses are seen. No inguinal lymphadenopathy is seen.  There is a large 12.0 x 8.1 x 6.0 cm mass arising along the right side of the lower lumbar spine, with diffuse erosion into vertebral bodies at L4, L5 and S1, obscuring the exiting nerve roots on the right at these levels. No definite extension into the spinal canal is yet seen. The mass underlies the right psoas muscle and tracks about the distal IVC and right common iliac vein. There is diffuse encasement of the right internal iliac artery. Mass extends inferiorly medial to the right iliacus muscle.  No additional osseous abnormalities are identified.  IMPRESSION: 1. Large 12.0 x 8.1 x 6.0 cm right paraspinal mass is likely mildly increased in size from recent prior CT, with diffuse erosion into the vertebral bodies at L4, L5 and S1, obscuring the exiting nerve roots on the right at these levels. No definite extension into the spinal canal. Mass underlies the right psoas muscle and tracks about the distal SVC and right common  iliac vein. Diffuse encasement of the right internal iliac artery. 2. Mild right-sided hydronephrosis, with prominence of the proximal to mid right ureter. This appears to reflect partial encasement of the mid right ureter by the large right paraspinal mass. 3. Nodule at the left lung base,  measuring 1.0 cm, of uncertain significance. This is apparently new from 52. Mild underlying bibasilar atelectasis noted. 4. Few scattered bilateral renal cysts. Mild bilateral renal scarring noted. 5. Diffuse calcification along the abdominal aorta and its branches. 6. Scattered diverticulosis of the splenic flexure of the colon, and along the descending and sigmoid colon, without evidence of diverticulitis.   Electronically Signed   By: Garald Balding M.D.   On: 11/24/2014 00:31   Ct Lumbar Spine Wo Contrast  11/08/2014   CLINICAL DATA:  Low back pain for 3 weeks. Two falls in 1 day. Severe right hip and right leg pain. Personal history of stage IV non-small cell lung cancer. Prior chemotherapy. No current therapy.  EXAM: CT LUMBAR SPINE WITHOUT CONTRAST  TECHNIQUE: Multidetector CT imaging of the lumbar spine was performed without intravenous contrast administration. Multiplanar CT image reconstructions were also generated.  COMPARISON:  Lumbar spine radiographs 10/25/2014. CT abdomen and pelvis 06/23/2013  FINDINGS: Vertebral alignment is unchanged with grade 1 anterolisthesis of L3 on L4 and L4 on L5 and grade 1 retrolisthesis of L5 on S1. There is slight lumbar levoscoliosis. Disc space narrowing is mild at L4-5 and moderate to severe at L5-S1 and with associated vacuum disc phenomenon.  There is a new, large right-sided lumbosacral paravertebral soft tissue mass which measures approximately 6.9 x 7.1 x 10.5 cm. This invades the lateral aspects of the L5 greater than L4 vertebral bodies. There is involvement of the right L5 pedicle and transverse process, and there is also prominent involvement of the right sacral ala and right S1 vertebral body. The mass displaces the right psoas muscle laterally and encases the right internal iliac artery as well as right common, internal and external iliac veins. It also partially encircles the lower most IVC. There is a pathologic compression fracture of the right half  of the L5 vertebral body with mild vertebral body height loss. Tumor extends into the superior aspect of the right L5-S1 neural foramen.  Extensive atherosclerotic vascular calcification is present in the abdomen and pelvis. The kidneys are atrophic with similar appearance of small hypodense renal lesions most likely representing cysts. Cholecystectomy clips are noted.  L1-2: Mild disc bulging without significant stenosis.  L2-3: Mild disc bulging and mild facet and ligamentum flavum hypertrophy result in mild right lateral recess stenosis and mild right and minimal left neural foraminal stenosis without significant spinal stenosis.  L3-4: Listhesis with disc uncovering, ligamentum flavum hypertrophy, and severe bilateral facet arthrosis result in mild to moderate right and minimal left neural foraminal narrowing without significant spinal stenosis.  L4-5: Listhesis with disc uncovering and moderate facet arthrosis without significant stenosis. The right L4 nerve root is likely involved by tumor lateral to the foramen.  L5-S1: Listhesis with disc uncovering and mild facet arthrosis without significant stenosis. The right L5 nerve root is likely involved by tumor lateral to the foramen.  IMPRESSION: New 11 cm destructive right-sided paravertebral soft tissue mass at the lumbosacral junction, consistent with metastasis. There is osseous involvement at L4, L5, and S1 with pathologic L5 compression fracture. Right-sided iliac arterial and venous encasement as above.  These results were called by telephone at the time of interpretation on 11/08/2014 at 4:01  pm to Dr. Arther Abbott , who verbally acknowledged these results.   Electronically Signed   By: Logan Bores   On: 11/08/2014 16:05   US Venous Img Lower Unilateral Right  11/08/2014   CLINICAL DATA:  Right lower extremity pain and edema. History of smoking. History of lung cancer. Evaluate for DVT.  EXAM: RIGHT LOWER EXTREMITY VENOUS DOPPLER ULTRASOUND   TECHNIQUE: Gray-scale sonography with graded compression, as well as color Doppler and duplex ultrasound were performed to evaluate the lower extremity deep venous systems from the level of the common femoral vein and including the common femoral, femoral, profunda femoral, popliteal and calf veins including the posterior tibial, peroneal and gastrocnemius veins when visible. The superficial great saphenous vein was also interrogated. Spectral Doppler was utilized to evaluate flow at rest and with distal augmentation maneuvers in the common femoral, femoral and popliteal veins.  COMPARISON:  None.  FINDINGS: Contralateral Common Femoral Vein: Respiratory phasicity is normal and symmetric with the symptomatic side. No evidence of thrombus. Normal compressibility.  Common Femoral Vein: No evidence of thrombus. Normal compressibility, respiratory phasicity and response to augmentation.  Saphenofemoral Junction: No evidence of thrombus. Normal compressibility and flow on color Doppler imaging.  Profunda Femoral Vein: No evidence of thrombus. Normal compressibility and flow on color Doppler imaging.  Femoral Vein: No evidence of thrombus. Normal compressibility, respiratory phasicity and response to augmentation.  Popliteal Vein: No evidence of thrombus. Normal compressibility, respiratory phasicity and response to augmentation.  Calf Veins: No evidence of thrombus. Normal compressibility and flow on color Doppler imaging.  Superficial Great Saphenous Vein: No evidence of thrombus. Normal compressibility and flow on color Doppler imaging.  Venous Reflux:  None.  Other Findings: Subcutaneous edema is noted at the level of the calf and ankle (representative images 38, 39 and 40).  IMPRESSION: No evidence of DVT within the right lower extremity.   Electronically Signed   By: Sandi Mariscal M.D.   On: 11/08/2014 15:41   Ct Biopsy  11/23/2014   INDICATION: History of lung cancer, now with infiltrative right-sided paraspinal  mass adjacent to the caudal aspect of the lumbar spine extending to involve the lumbar sacral junction. Please perform CT-guided biopsy for tissue diagnostic purposes  EXAM: CT-GUIDED BIOPSY OF INFILTRATIVE RIGHT PARASPINAL MASS.  COMPARISON:  Lumbar spine CT - 11/08/2014  MEDICATIONS: Fentanyl 25 mcg IV; Versed 1 mg IV  ANESTHESIA/SEDATION: Sedation time  8 minutes  CONTRAST:  None  COMPLICATIONS: None immediate  PROCEDURE: Informed consent was obtained from the patient following an explanation of the procedure, risks, benefits and alternatives. A time out was performed prior to the initiation of the procedure.  The patient was positioned prone on the CT table and a limited CT was performed for procedural planning demonstrating unchanged appearance of the note expansile paraspinal mass adjacent to the caudal aspect of the lumbar spine measuring at least 5.6 x 7.3 cm in diameter (image 16, series 2). The procedure was planned. The operative site was prepped and draped in the usual sterile fashion. Appropriate trajectory was confirmed with a 22 gauge spinal needle after the adjacent tissues were anesthetized with 1% Lidocaine with epinephrine.  Under intermittent CT guidance, a 17 gauge coaxial needle was advanced into the peripheral aspect of the mass. Appropriate positioning was confirmed and 6 core needle biopsy samples were obtained with an 18 gauge core needle biopsy device. The co-axial needle was removed and hemostasis was achieved with manual compression.  A limited postprocedural CT was  negative for hemorrhage or additional complication. A dressing was placed. The patient tolerated the procedure well without immediate postprocedural complication.  IMPRESSION: Technically successful CT guided core needle biopsy of expansile right-sided paraspinal mass adjacent to the caudal aspect of the lumbar spine.   Electronically Signed   By: Sandi Mariscal M.D.   On: 11/23/2014 10:13   Dg Chest Port 1 View  11/24/2014    CLINICAL DATA:  Fever.  Possible sepsis.  Initial encounter.  EXAM: PORTABLE CHEST - 1 VIEW  COMPARISON:  Chest radiograph performed 07/29/2014, and CT of the chest performed 06/27/2014  FINDINGS: The lungs are well-aerated. Vascular congestion is noted, with mild bibasilar scarring. There is no evidence of pleural effusion or pneumothorax. Known left-sided pulmonary nodules are partially characterized on radiograph.  The cardiomediastinal silhouette is mildly enlarged. No acute osseous abnormalities are seen.  IMPRESSION: 1. Vascular congestion and mild cardiomegaly, with mild bibasilar scarring. No definite evidence of pneumonia. 2. Known left-sided pulmonary nodules are partially characterized on radiograph.   Electronically Signed   By: Garald Balding M.D.   On: 11/24/2014 06:59   Ir Nephrostomy Placement Right  11/24/2014   CLINICAL DATA:  81 year old with a right paraspinal mass, mild right hydronephrosis and urinary infection. Request for percutaneous nephrostomy tube.  EXAM: RIGHT PERCUTANEOUS NEPHROSTOMY TUBE PLACEMENT WITH ULTRASOUND AND FLUOROSCOPIC GUIDANCE  Physician: Stephan Minister. Henn, MD  FLUOROSCOPY TIME:  5 minutes and 6 seconds.  37  mGy  MEDICATIONS: 0.5 mg Versed, 25 mcg fentanyl. A radiology nurse monitored the patient for moderate sedation.  Patient is already receiving antibiotics and additional antibiotics were not given for this procedure.  ANESTHESIA/SEDATION: Moderate sedation time: 26 minutes  PROCEDURE: Informed consent was obtained for a percutaneous nephrostomy tube. Patient was placed prone. The right kidney was identified with ultrasound. The right flank was prepped and draped in sterile fashion. Maximal barrier sterile technique was utilized including caps, mask, sterile gowns, sterile gloves, sterile drape, hand hygiene and skin antiseptic. Skin was anesthetized with 1% lidocaine. A 22 gauge needle was directed into the right mid/lower pole collecting system with ultrasound guidance.  Contrast injection confirmed placement in the renal collecting system. A 0.018 wire was advanced. An Accustick dilator set was placed. Tract was dilated to accommodate a 10.2 [place] multipurpose drain. The catheter was reconstituted in the renal pelvis. Contrast injection confirmed appropriate positioning. Catheter was attached to a gravity bag and sutured in place. Bandage was placed at the catheter site. Fluoroscopic and ultrasound images were taken and saved for documentation.  FINDINGS: Mild dilatation of the right renal collecting system. Access was obtained from the mid/lower pole. Small amount of contrast was draining distal to the obstructing lesion.  Estimated blood loss: Minimal  COMPLICATIONS: None  IMPRESSION: Successful placement of a right percutaneous nephrostomy tube with ultrasound and fluoroscopic guidance.   Electronically Signed   By: Markus Daft M.D.   On: 11/24/2014 17:11    Oren Binet, MD  [facility] Pager:[phone]  If 7PM-7AM, please contact night-coverage [email or web address] Password [email or web address] 11/26/2014, 12:20 PM   LOS: 2 days

## 2014-11-26 NOTE — Progress Notes (Signed)
ANTICOAGULATION CONSULT NOTE - Follow Up Consult  Pharmacy Consult for Heparin Indication: DVT  Allergies  Allergen Reactions  . Amlodipine Swelling  . Ciprofloxacin Hives and Other (See Comments)    REACTION: weakness  . Statins Other (See Comments)  . Aspirin Swelling    Mouth swelling, tongue  . Cephalexin Hives  . Hydralazine Nausea And Vomiting  . Iron Nausea And Vomiting  . Sulfonamide Derivatives Hives  . Penicillins Other (See Comments)    Reaction is unknown    Patient Measurements: Height: 5' 8.5" (174 cm) Weight: 154 lb 5.2 oz (70 kg) IBW/kg (Calculated) : 65.05 Heparin Dosing Weight:   Vital Signs: Temp: 98.8 F (37.1 C) (06/24 2030) Temp Source: Oral (06/24 2030) BP: 150/65 mmHg (06/24 2251) Pulse Rate: 80 (06/24 2251)  Labs:  Recent Labs  11/23/14 0730 11/23/14 2148 11/24/14 0555 11/24/14 0700 11/25/14 0650 11/25/14 1605 11/26/14 0137  HGB 10.2* 9.8* 9.2*  --  8.5*  --  8.1*  HCT 32.7* 30.6* 29.1*  --  26.5*  --  25.3*  PLT 336 323 278  --  240  --  247  APTT 36  --   --  35  --   --   --   LABPROT 15.6*  --   --  16.8*  --   --  15.9*  INR 1.22  --   --  1.35  --   --  1.25  HEPARINUNFRC  --   --   --   --  0.12* 0.10* 0.33  CREATININE  --  3.31* 3.09*  --   --   --  2.94*    Estimated Creatinine Clearance: 15.9 mL/min (by C-G formula based on Cr of 2.94).   Medications:  Infusions:  . heparin 1,400 Units/hr (11/25/14 1717)    Assessment: Patient with heparin level at goal after rate change.  No heparin issues noted.  Goal of Therapy:  Heparin level 0.3-0.7 units/ml Monitor platelets by anticoagulation protocol: Yes   Plan:  Continue heparin drip at current rate Recheck level at 1000.  Tyler Deis, Shea Stakes Crowford 11/26/2014,4:15 AM

## 2014-11-26 NOTE — Evaluation (Signed)
Physical Therapy Evaluation Patient Details Name: [name] MRN: [id number] DOB: 1934-03-26 Today's Date: 11/26/2014   History of Present Illness  [name] is a 81 y.o. female with history of stage IV lung cancer who was recently diagnosed with a right paraspinal mass and had undergone biopsy 11/23/14 following which patient was discharged home and patient was experiencing fever chills. Since the fever chills persisted patient came back to the ER. Patient also is complaining of some right sided flank pain. CT abdomen and pelvis shows mild hydronephrosis with tumor encasing the ureter. UA was showing features consistent with UTI. Patient has been admitted for further management of UTI with possible developing sepsis. Patient has been having increasing right lower extremity edema. positive for  bilateral DVT's, S/P percutaneous drain on R  Clinical Impression  Patient with noted impaired motor and sensory function on R leg, foot drop, mottling of both feet. Patient will benefit from PT to address problems listed below. Patient  Desires to return home with daughter  Assisting.    Follow Up Recommendations SNF;Supervision/Assistance - 24 hour (patient states that daughter plans to stay with her, if so then HHPT)    Equipment Recommendations  Rolling walker with 5" wheels    Recommendations for Other Services       Precautions / Restrictions Precautions Precautions: Fall Precaution Comments: RLE weakness      Mobility  Bed Mobility               General bed mobility comments: in recliner  Transfers Overall transfer level: Needs assistance Equipment used: Rolling walker (2 wheeled) Transfers: Sit to/from Stand Sit to Stand: +2 safety/equipment;Mod assist         General transfer comment: cues for safety, assist to rise from  recliner, did not  control descent to sit down  Ambulation/Gait Ambulation/Gait assistance: Mod assist;+2 safety/equipment Ambulation  Distance (Feet): 30 Feet Assistive device: Rolling walker (2 wheeled) Gait Pattern/deviations: Antalgic;Steppage;Decreased step length - right     General Gait Details: decreased control  of R leg during swing, decreased dorsiflexion on R  Stairs            Wheelchair Mobility    Modified Rankin (Stroke Patients Only)       Balance Overall balance assessment: Needs assistance         Standing balance support: During functional activity;Bilateral upper extremity supported Standing balance-Leahy Scale: Poor                               Pertinent Vitals/Pain Pain Assessment: 0-10 Pain Score: 7  Pain Location: R leg Pain Descriptors / Indicators: Burning;Discomfort;Stabbing;Tingling Pain Intervention(s): Limited activity within patient's tolerance;Monitored during session;Repositioned;Ice applied    Home Living Family/patient expects to be discharged to:: Private residence Living Arrangements: Children Available Help at Discharge: Family Type of Home: Mobile home Home Access: Stairs to enter Entrance Stairs-Rails: Psychiatric nurse of Steps: 6 Home Layout: One level Home Equipment: Cane - single point;Walker - standard      Prior Function Level of Independence: Needs assistance   Gait / Transfers Assistance Needed: recently required assistance due to LE weakness           Hand Dominance        Extremity/Trunk Assessment   Upper Extremity Assessment: Overall WFL for tasks assessed           Lower Extremity Assessment: RLE deficits/detail;LLE deficits/detail RLE Deficits /  Details: hip flexion 2, knee extension 3, dorsiflexion trace, mottling of  toes and foot on plantar. entire leg  edema present, diminished light touch with hyperesthesia of foot. LLE Deficits / Details: strength WFL, noted  mottling in foot, plantar     Communication   Communication: No difficulties  Cognition Arousal/Alertness:  Awake/alert Behavior During Therapy: WFL for tasks assessed/performed Overall Cognitive Status: Within Functional Limits for tasks assessed                      General Comments      Exercises        Assessment/Plan    PT Assessment Patient needs continued PT services  PT Diagnosis Difficulty walking;Abnormality of gait;Acute pain   PT Problem List Decreased strength;Decreased activity tolerance;Decreased balance;Decreased mobility;Impaired sensation  PT Treatment Interventions DME instruction;Gait training;Stair training;Functional mobility training;Therapeutic activities;Therapeutic exercise;Patient/family education   PT Goals (Current goals can be found in the Care Plan section) Acute Rehab PT Goals Patient Stated Goal: to go home PT Goal Formulation: With patient/family Time For Goal Achievement: 12/10/14 Potential to Achieve Goals: Good    Frequency Min 3X/week   Barriers to discharge        Co-evaluation               End of Session Equipment Utilized During Treatment: Gait belt Activity Tolerance: Patient limited by pain Patient left: in chair;with call bell/phone within reach;with family/visitor present Nurse Communication: Mobility status         Time: 4497-5300 PT Time Calculation (min) (ACUTE ONLY): 24 min   Charges:   PT Evaluation $Initial PT Evaluation Tier I: 1 Procedure PT Treatments $Gait Training: 8-22 mins   PT G Codes:        Marcelino Freestone PT [phone]  11/26/2014, 2:17 PM

## 2014-11-26 NOTE — Progress Notes (Signed)
ANTICOAGULATION CONSULT NOTE - Initial  Pharmacy Consult for Lovenox Indication:  VTE treatment   Allergies  Allergen Reactions  . Amlodipine Swelling  . Ciprofloxacin Hives and Other (See Comments)    REACTION: weakness  . Statins Other (See Comments)  . Aspirin Swelling    Mouth swelling, tongue  . Cephalexin Hives  . Hydralazine Nausea And Vomiting  . Iron Nausea And Vomiting  . Sulfonamide Derivatives Hives  . Penicillins Other (See Comments)    Reaction is unknown    Patient Measurements: Height: 5' 8.5" (174 cm) Weight: 154 lb 5.2 oz (70 kg) IBW/kg (Calculated) : 65.05  Vital Signs: Temp: 98.3 F (36.8 C) (06/25 0514) Temp Source: Oral (06/25 0514) BP: 144/65 mmHg (06/25 0514) Pulse Rate: 72 (06/25 0514)  Labs:  Recent Labs  11/23/14 2148 11/24/14 0555 11/24/14 0700  11/25/14 0650 11/25/14 1605 11/26/14 0137 11/26/14 1006  HGB 9.8* 9.2*  --   --  8.5*  --  8.1*  --   HCT 30.6* 29.1*  --   --  26.5*  --  25.3*  --   PLT 323 278  --   --  240  --  247  --   APTT  --   --  35  --   --   --   --   --   LABPROT  --   --  16.8*  --   --   --  15.9*  --   INR  --   --  1.35  --   --   --  1.25  --   HEPARINUNFRC  --   --   --   < > 0.12* 0.10* 0.33 0.58  CREATININE 3.31* 3.09*  --   --   --   --  2.94*  --   < > = values in this interval not displayed.  Estimated Creatinine Clearance: 15.9 mL/min (by C-G formula based on Cr of 2.94).   Medical History: Past Medical History  Diagnosis Date  . Hypertension   . Hypercholesterolemia   . CKD (chronic kidney disease)   . Coronary artery disease     a.  LHC (06/04/05): LHC done in [place] with high grade RCA => s/p BMS to RCA;  b.  Nuclear (09/14/09): Lexiscan; Inf infarct with mild peri-infarct ishemia, EF 52%; Low Risk.  Marland Kitchen GERD (gastroesophageal reflux disease)   . Chronic anemia   . Ischemic cardiomyopathy     a. Echo (07/26/13): Mild LVH, EF 35-40%, diff HK, inf AK, Gr 2 DD, Tr AI, mildly dilated  Ao root, MAC, mild MR, mild LAE, mod reduced RVSF.  . Non-small cell carcinoma of lung     Stage IV   Assessment: 75 yoF with stage 4 metastatic lung cancer and R paraspinal mass (s/p biopsy on 6/22). She also has a new right hydronephrosis -- s/p R perc nephrostomy placement on 6/23. She c/o RLE edema on admission and Doppler doppler showed new BL DVT. Heparin drip started 6 hours after perc nephrostomy drain placement for thrombosis. Bridge to warfarin therapy started 6/24.  MD prefers for patient to be on Lovenox due to cancer history.  MD and hematology aware of CKD-IV and wish to switch to Lovenox if not contraindicated.  Had discussion with MD and although Lovenox use is not ideal in severe CKD, use is not contraindicated and think we can safely administer if we monitor anti-Xa levels.  Today, 11/26/2014:  INR 1.25.  HL therapeutic this  morning.    Hgb trending down, today 8.1. Platelets WNL.  No bleeding noted.  CKD-IV.  CrCl~17 ml/min  Goal of Therapy:  4-hour Anti-Xa level 0.6-1 unit/ml Monitor platelets by anticoagulation protocol: Yes   Plan:  1.  Stop heparin infusion. 2.  Start Lovenox 70 mg (1 mg/kg) SQ q24h.  Give dose 1 hour after heparin drip stopped. 3.  F/u CBC q72h. 4.  Will f/u SCr and plan on obtaining an anti-Xa level after 3rd dose.  If patient plans to be discharged sooner, will plan to get an earlier level.  Hershal Coria, PharmD, BCPS Pager: [phone] 11/26/2014 11:17 AM

## 2014-11-27 DIAGNOSIS — R7881 Bacteremia: Secondary | ICD-10-CM

## 2014-11-27 LAB — CBC
HEMATOCRIT: 27 % — AB (ref 36.0–46.0)
Hemoglobin: 8.6 g/dL — ABNORMAL LOW (ref 12.0–15.0)
MCH: 26.1 pg (ref 26.0–34.0)
MCHC: 31.9 g/dL (ref 30.0–36.0)
MCV: 81.8 fL (ref 78.0–100.0)
PLATELETS: 272 10*3/uL (ref 150–400)
RBC: 3.3 MIL/uL — ABNORMAL LOW (ref 3.87–5.11)
RDW: 14.6 % (ref 11.5–15.5)
WBC: 9.3 10*3/uL (ref 4.0–10.5)

## 2014-11-27 LAB — BASIC METABOLIC PANEL
Anion gap: 10 (ref 5–15)
BUN: 57 mg/dL — ABNORMAL HIGH (ref 6–20)
CO2: 23 mmol/L (ref 22–32)
Calcium: 8.6 mg/dL — ABNORMAL LOW (ref 8.9–10.3)
Chloride: 101 mmol/L (ref 101–111)
Creatinine, Ser: 2.8 mg/dL — ABNORMAL HIGH (ref 0.44–1.00)
GFR calc non Af Amer: 15 mL/min — ABNORMAL LOW (ref >60)
GFR, EST AFRICAN AMERICAN: 17 mL/min — AB (ref >60)
Glucose, Bld: 100 mg/dL — ABNORMAL HIGH (ref 65–99)
POTASSIUM: 4.4 mmol/L (ref 3.5–5.1)
SODIUM: 134 mmol/L — AB (ref 135–145)

## 2014-11-27 LAB — CULTURE, BLOOD (ROUTINE X 2)

## 2014-11-27 MED ORDER — CLONIDINE HCL 0.1 MG PO TABS
0.1000 mg | ORAL_TABLET | Freq: Three times a day (TID) | ORAL | Status: DC
  Administered 2014-11-27 – 2014-11-29 (×8): 0.1 mg via ORAL
  Filled 2014-11-27 (×9): qty 1

## 2014-11-27 MED ORDER — SENNOSIDES-DOCUSATE SODIUM 8.6-50 MG PO TABS
1.0000 | ORAL_TABLET | Freq: Two times a day (BID) | ORAL | Status: DC
  Administered 2014-11-27 – 2014-11-29 (×5): 1 via ORAL
  Filled 2014-11-27 (×6): qty 1

## 2014-11-27 MED ORDER — METOPROLOL SUCCINATE ER 25 MG PO TB24
25.0000 mg | ORAL_TABLET | Freq: Every day | ORAL | Status: DC
  Administered 2014-11-27 – 2014-11-29 (×3): 25 mg via ORAL
  Filled 2014-11-27 (×3): qty 1

## 2014-11-27 MED ORDER — POLYETHYLENE GLYCOL 3350 17 G PO PACK
17.0000 g | PACK | Freq: Every day | ORAL | Status: DC
  Administered 2014-11-27 – 2014-11-29 (×2): 17 g via ORAL
  Filled 2014-11-27 (×3): qty 1

## 2014-11-27 MED ORDER — SORBITOL 70 % SOLN
30.0000 mL | Freq: Every day | Status: DC | PRN
  Administered 2014-11-27: 30 mL via ORAL
  Filled 2014-11-27: qty 30

## 2014-11-27 NOTE — Progress Notes (Addendum)
ANTIBIOTIC CONSULT NOTE - FOLLOW UP  Pharmacy Consult for Primaxin Indication: sepsis 2/2 Proteus bacteremia/UTI  Allergies  Allergen Reactions  . Amlodipine Swelling  . Ciprofloxacin Hives and Other (See Comments)    REACTION: weakness  . Statins Other (See Comments)  . Aspirin Swelling    Mouth swelling, tongue  . Cephalexin Hives  . Hydralazine Nausea And Vomiting  . Iron Nausea And Vomiting  . Sulfonamide Derivatives Hives  . Penicillins Other (See Comments)    Reaction is unknown    Patient Measurements: Height: 5' 8.5" (174 cm) Weight: 154 lb 5.2 oz (70 kg) IBW/kg (Calculated) : 65.05  Vital Signs: Temp: 98.2 F (36.8 C) (06/26 0511) Temp Source: Axillary (06/26 0511) BP: 172/65 mmHg (06/26 0511) Pulse Rate: 72 (06/26 0511) Intake/Output from previous day: 06/25 0701 - 06/26 0700 In: 630 [P.O.:630] Out: 450 [Urine:450] Intake/Output from this shift: Total I/O In: -  Out: 150 [Urine:150]  Labs:  Recent Labs  11/25/14 0650 11/26/14 0137 11/27/14 0426  WBC 10.5 9.9 9.3  HGB 8.5* 8.1* 8.6*  PLT 240 247 272  CREATININE  --  2.94* 2.80*   Estimated Creatinine Clearance: 16.7 mL/min (by C-G formula based on Cr of 2.8). No results for input(s): VANCOTROUGH, VANCOPEAK, VANCORANDOM, GENTTROUGH, GENTPEAK, GENTRANDOM, TOBRATROUGH, TOBRAPEAK, TOBRARND, AMIKACINPEAK, AMIKACINTROU, AMIKACIN in the last 72 hours.   Microbiology: Recent Results (from the past 720 hour(s))  Urine culture     Status: None   Collection Time: 11/23/14  8:22 AM  Result Value Ref Range Status   Specimen Description URINE, CLEAN CATCH  Final   Special Requests NONE  Final   Culture   Final    >=100,000 COLONIES/mL PROTEUS MIRABILIS Performed at [facility]    Report Status 11/26/2014 FINAL  Final   Organism ID, Bacteria PROTEUS MIRABILIS  Final      Susceptibility   Proteus mirabilis - MIC*    AMPICILLIN <=2 SENSITIVE Sensitive     CEFAZOLIN <=4 SENSITIVE Sensitive    CEFTRIAXONE <=1 SENSITIVE Sensitive     CIPROFLOXACIN <=0.25 SENSITIVE Sensitive     GENTAMICIN >=16 RESISTANT Resistant     IMIPENEM 2 SENSITIVE Sensitive     NITROFURANTOIN 128 RESISTANT Resistant     TRIMETH/SULFA <=20 SENSITIVE Sensitive     AMPICILLIN/SULBACTAM <=2 SENSITIVE Sensitive     PIP/TAZO <=4 SENSITIVE Sensitive     * >=100,000 COLONIES/mL PROTEUS MIRABILIS  Urine culture     Status: None   Collection Time: 11/23/14 11:24 PM  Result Value Ref Range Status   Specimen Description URINE, RANDOM  Final   Special Requests NONE  Final   Culture   Final    >=100,000 COLONIES/mL PROTEUS MIRABILIS Performed at [facility]    Report Status 11/26/2014 FINAL  Final   Organism ID, Bacteria PROTEUS MIRABILIS  Final      Susceptibility   Proteus mirabilis - MIC*    AMPICILLIN <=2 SENSITIVE Sensitive     CEFAZOLIN <=4 SENSITIVE Sensitive     CEFTRIAXONE <=1 SENSITIVE Sensitive     CIPROFLOXACIN <=0.25 SENSITIVE Sensitive     GENTAMICIN >=16 RESISTANT Resistant     IMIPENEM 2 SENSITIVE Sensitive     NITROFURANTOIN 128 RESISTANT Resistant     TRIMETH/SULFA <=20 SENSITIVE Sensitive     AMPICILLIN/SULBACTAM <=2 SENSITIVE Sensitive     PIP/TAZO <=4 SENSITIVE Sensitive     * >=100,000 COLONIES/mL PROTEUS MIRABILIS  Blood culture (routine x 2)     Status: None (Preliminary  result)   Collection Time: 11/24/14  1:22 AM  Result Value Ref Range Status   Specimen Description BLOOD LEFT FOREARM  Final   Special Requests BOTTLES DRAWN AEROBIC AND ANAEROBIC 5CC  Final   Culture  Setup Time   Final    GRAM NEGATIVE RODS IN BOTH AEROBIC AND ANAEROBIC BOTTLES CRITICAL RESULT CALLED TO, READ BACK BY AND VERIFIED WITH: Lynnae January RN 2200 11/24/14 A BROWNING CONFIRMED BY M CAMPBELL    Culture   Final    PROTEUS MIRABILIS Performed at [facility]    Report Status PENDING  Incomplete   Organism ID, Bacteria PROTEUS MIRABILIS  Final      Susceptibility   Proteus mirabilis -  MIC*    AMPICILLIN <=2 SENSITIVE Sensitive     CEFAZOLIN <=4 SENSITIVE Sensitive     CEFEPIME <=1 SENSITIVE Sensitive     CEFTAZIDIME <=1 SENSITIVE Sensitive     CEFTRIAXONE <=1 SENSITIVE Sensitive     CIPROFLOXACIN <=0.25 SENSITIVE Sensitive     GENTAMICIN >=16 RESISTANT Resistant     IMIPENEM 0.5 SENSITIVE Sensitive     TRIMETH/SULFA <=20 SENSITIVE Sensitive     AMPICILLIN/SULBACTAM <=2 SENSITIVE Sensitive     PIP/TAZO <=4 SENSITIVE Sensitive     * PROTEUS MIRABILIS  Blood culture (routine x 2)     Status: None (Preliminary result)   Collection Time: 11/24/14  1:22 AM  Result Value Ref Range Status   Specimen Description RIGHT ANTECUBITAL  Final   Special Requests BOTTLES DRAWN AEROBIC AND ANAEROBIC 5CC  Final   Culture  Setup Time   Final    GRAM NEGATIVE RODS ANAEROBIC BOTTLE ONLY CRITICAL RESULT CALLED TO, READ BACK BY AND VERIFIED WITH: Lynnae January RN 2200 11/24/14 A BROWNING CONFIRMED BY M CAMPBELL    Culture   Final    PROTEUS MIRABILIS SUSCEPTIBILITIES PERFORMED ON PREVIOUS CULTURE WITHIN THE LAST 5 DAYS. Performed at [facility]    Report Status PENDING  Incomplete  Culture, blood (routine x 2)     Status: None (Preliminary result)   Collection Time: 11/25/14 12:32 PM  Result Value Ref Range Status   Specimen Description BLOOD RIGHT ANTECUBITAL  Final   Special Requests BOTTLES DRAWN AEROBIC AND ANAEROBIC 10CC EACH  Final   Culture   Final    NO GROWTH 1 DAY Performed at [facility]    Report Status PENDING  Incomplete  Culture, blood (routine x 2)     Status: None (Preliminary result)   Collection Time: 11/25/14 12:36 PM  Result Value Ref Range Status   Specimen Description BLOOD RIGHT HAND  Final   Special Requests BOTTLES DRAWN AEROBIC ONLY 3CC  Final   Culture   Final    NO GROWTH 1 DAY Performed at [facility]    Report Status PENDING  Incomplete    Anti-infectives    Start     Dose/Rate Route Frequency Ordered Stop   11/24/14  1800  imipenem-cilastatin (PRIMAXIN) 250 mg in sodium chloride 0.9 % 100 mL IVPB     250 mg 200 mL/hr over 30 Minutes Intravenous Every 12 hours 11/24/14 0234     11/24/14 0800  vancomycin (VANCOCIN) IVPB 1000 mg/200 mL premix  Status:  Discontinued     1,000 mg 200 mL/hr over 60 Minutes Intravenous Every 48 hours 11/24/14 0700 11/25/14 0410   11/24/14 0145  imipenem-cilastatin (PRIMAXIN) 500 mg in sodium chloride 0.9 % 100 mL IVPB     500  mg 200 mL/hr over 30 Minutes Intravenous  Once 11/24/14 0142 11/24/14 0418      Assessment: 31 yoF with with PMH of hypertension, CKD, CAD, stage IV non-small cell carcinoma in remission presenting with fevers and dysuria, cloudy urine and foul odor.  Patient started on Primaxin.  Blood and urine cultures grew Proteus mirabilis which is sensitive to the majority of antibiotics tested.  Due to patient's extensive allergy list with documented reactions of "hives" to multiple antibiotics, unable to narrow therapy.  MD notes plan to place PICC for discharge to SNF on IV Primaxin for total 14 day course.  Repeat blood cultures no growth to date.  6/23 >> Primaxin >> 6/23 >> vanc >> 6/24  Cultures: 6/22 urine: Proteus mirabilis (R-gent, NTF only) 6/23 blood x2: 2/2 Proteus mirabilis (R-gent only) 6/24 blood x2: ngtd  SCr 2.80, CrCl~18 ml/min (normalized)  Goal of Therapy:  Doses adjusted per renal function Eradication of infection  Plan:  - Continue Primaxin 250 mg IV q12h.  - Although patient has sulfa drugs listed as an allergy, appears that patient was prescribed Bactrim in March 2016.  MD office note in March also notes that "She's feeling much better after a course of Bactrim" in reference to treatment of UTI.  Could consider Bactrim for treatment at discharge since current cultures sensitive.  Hershal Coria 11/27/2014,9:31 AM

## 2014-11-27 NOTE — Progress Notes (Addendum)
PATIENT DETAILS Name: [name] Age: 81 y.o. Sex: female Date of Birth: [birth year] Admit Date: 11/23/2014 Admitting Physician Rise Patience, MD HYI:FOYD, [place], [place]  Subjective: Better-but still with right leg and right back pain.  Assessment/Plan: Principal Problem: Proteus bacteremia with sepsis: Likely etiology UTI. Continue Primaxin-has numerous allergies-mostly generalized hives-may just need to place midline PICC and continue with IV Abx for a total of 2 weeks. Will discuss with ID.  CT abdomen does not show any other foci of infection  Active Problems: Large right paraspinal mass: Known stage IV lung cancer and was on observation with close follow-up with oncology. Patient underwent CT-guided biopsy on 6/22, which shows metastatic squamous carcinoma. Evaluated by Dr. Julien Nordmann, recommending radiation oncology evaluation.   Right Foot Drop:occurred 5 weeks prior to admission (per patient)-likely secondary to right parapsinal mass. Will check MRI to further delineate. Await Rad Onc evaluation  Bilateral lower extremity DVT: Likely from above mass encasing vessels. Started on IV heparin and overlapping Coumadin-this M.D. spoke with Dr. Mohamed-recommendations were to see if pharmacy could dose Lovenox given active cancer. Begin Lovenox teaching, will consult case management for insurance issues.  Acute on chronic kidney disease stage IV: Acute renal failure likely secondary to hydronephrosis/obstruction. Improving and now close to usual baseline. Continue to follow closely.  Hydronephrosis of the right kidney: Secondary to a large right paraspinal mass encasing the right ureter. Subsequently underwent right percutaneous nephrostomy tube placement. Urology following.  Anemia: Likely secondary to acute acute illness-has anemia of chronic kidney disease/malignancy. Hb remains stable. Follow and transfuse as needed.  Chronic diastolic heart failure: Compensated.  Continue to hold Lasix for now.  History of CAD: Currently without chest pain or shortness of breath. Has history of DES to RCA in 2007-and was on Plavix prior to admission-this has not been resumed. We will speak with primary cardiologist on 6/27-to see if we can perhaps discontinue-as patient will be on anticoagulation. In the meantime, continue with metoprolol.  Hypertension: Uncontrolled-change clonidine to TID and increase metoprolol to 50 mg daily. Follow and titrate accordingly  Disposition: Remain inpatient-SNF in 1-2 days  Antimicrobial agents  See below  Anti-infectives    Start     Dose/Rate Route Frequency Ordered Stop   11/24/14 1800  imipenem-cilastatin (PRIMAXIN) 250 mg in sodium chloride 0.9 % 100 mL IVPB     250 mg 200 mL/hr over 30 Minutes Intravenous Every 12 hours 11/24/14 0234     11/24/14 0800  vancomycin (VANCOCIN) IVPB 1000 mg/200 mL premix  Status:  Discontinued     1,000 mg 200 mL/hr over 60 Minutes Intravenous Every 48 hours 11/24/14 0700 11/25/14 0410   11/24/14 0145  imipenem-cilastatin (PRIMAXIN) 500 mg in sodium chloride 0.9 % 100 mL IVPB     500 mg 200 mL/hr over 30 Minutes Intravenous  Once 11/24/14 0142 11/24/14 0418      DVT Prophylaxis: On Lovenox   Code Status: Full code   Family Communication None at bedside  Procedures: Percutaneous right nephrostomy on 6/23 CT-guided biopsy of right paraspinal Mass on 6/22  CONSULTS:  hematology/oncology and urology  Time spent 30 minutes-Greater than 50% of this time was spent in counseling, explanation of diagnosis, planning of further management, and coordination of care.  MEDICATIONS: Scheduled Meds: . cloNIDine  0.1 mg Oral TID  . enoxaparin (LOVENOX) injection  1 mg/kg Subcutaneous Q24H  . enoxaparin   Does not  apply Once  . imipenem-cilastatin  250 mg Intravenous Q12H  . metoprolol succinate  25 mg Oral Daily  . pantoprazole  40 mg Oral Daily   Continuous Infusions:  PRN  Meds:.acetaminophen **OR** acetaminophen, LORazepam, morphine injection, ondansetron **OR** ondansetron (ZOFRAN) IV    PHYSICAL EXAM: Vital signs in last 24 hours: Filed Vitals:   11/26/14 0514 11/26/14 1400 11/26/14 2023 11/27/14 0511  BP: 144/65 141/58 178/61 172/65  Pulse: 72 71 77 72  Temp: 98.3 F (36.8 C) 97.7 F (36.5 C) 98.3 F (36.8 C) 98.2 F (36.8 C)  TempSrc: Oral Oral Oral Axillary  Resp: '16 16 18 16  '$ Height:      Weight:      SpO2: 96% 100% 99% 97%    Weight change:  Filed Weights   11/24/14 0440 11/25/14 1203  Weight: 65.772 kg (145 lb) 70 kg (154 lb 5.2 oz)   Body mass index is 23.12 kg/(m^2).   Gen Exam: Awake and alert with clear speech.   Neck: Supple, No JVD.   Chest: B/L Clear.   CVS: S1 S2 Regular, no murmurs.  Abdomen: soft, BS +, non tender, non distended.  Extremities: no edema, lower extremities warm to touch. Neurologic: Non Focal.  Right Foot drop Skin: No Rash.   Wounds: N/A.    Intake/Output from previous day:  Intake/Output Summary (Last 24 hours) at 11/27/14 0820 Last data filed at 11/27/14 0511  Gross per 24 hour  Intake    630 ml  Output    450 ml  Net    180 ml     LAB RESULTS: CBC  Recent Labs Lab 11/23/14 2148 11/24/14 0555 11/25/14 0650 11/26/14 0137 11/27/14 0426  WBC 18.0* 14.3* 10.5 9.9 9.3  HGB 9.8* 9.2* 8.5* 8.1* 8.6*  HCT 30.6* 29.1* 26.5* 25.3* 27.0*  PLT 323 278 240 247 272  MCV 81.6 81.5 81.5 80.1 81.8  MCH 26.1 25.8* 26.2 25.6* 26.1  MCHC 32.0 31.6 32.1 32.0 31.9  RDW 14.1 14.3 14.4 14.3 14.6  LYMPHSABS 0.8 0.7  --   --   --   MONOABS 0.7 0.9  --   --   --   EOSABS 0.0 0.0  --   --   --   BASOSABS 0.0 0.0  --   --   --     Chemistries   Recent Labs Lab 11/23/14 2148 11/24/14 0555 11/26/14 0137 11/27/14 0426  NA 131* 132* 133* 134*  K 3.9 4.1 4.3 4.4  CL 95* 96* 102 101  CO2 21* 20* 22 23  GLUCOSE 174* 121* 105* 100*  BUN 58* 55* 62* 57*  CREATININE 3.31* 3.09* 2.94* 2.80*  CALCIUM  8.5* 8.4* 8.1* 8.6*    CBG:  Recent Labs Lab 11/24/14 1251  GLUCAP 123*    GFR Estimated Creatinine Clearance: 16.7 mL/min (by C-G formula based on Cr of 2.8).  Coagulation profile  Recent Labs Lab 11/23/14 0730 11/24/14 0700 11/26/14 0137  INR 1.22 1.35 1.25    Cardiac Enzymes No results for input(s): CKMB, TROPONINI, MYOGLOBIN in the last 168 hours.  Invalid input(s): CK  Invalid input(s): POCBNP No results for input(s): DDIMER in the last 72 hours. No results for input(s): HGBA1C in the last 72 hours. No results for input(s): CHOL, HDL, LDLCALC, TRIG, CHOLHDL, LDLDIRECT in the last 72 hours. No results for input(s): TSH, T4TOTAL, T3FREE, THYROIDAB in the last 72 hours.  Invalid input(s): FREET3 No results for input(s): VITAMINB12, FOLATE, FERRITIN, TIBC, IRON,  RETICCTPCT in the last 72 hours. No results for input(s): LIPASE, AMYLASE in the last 72 hours.  Urine Studies No results for input(s): UHGB, CRYS in the last 72 hours.  Invalid input(s): UACOL, UAPR, USPG, UPH, UTP, UGL, UKET, UBIL, UNIT, UROB, ULEU, UEPI, UWBC, URBC, UBAC, CAST, UCOM, BILUA  MICROBIOLOGY: Recent Results (from the past 240 hour(s))  Urine culture     Status: None   Collection Time: 11/23/14  8:22 AM  Result Value Ref Range Status   Specimen Description URINE, CLEAN CATCH  Final   Special Requests NONE  Final   Culture   Final    >=100,000 COLONIES/mL PROTEUS MIRABILIS Performed at [facility]    Report Status 11/26/2014 FINAL  Final   Organism ID, Bacteria PROTEUS MIRABILIS  Final      Susceptibility   Proteus mirabilis - MIC*    AMPICILLIN <=2 SENSITIVE Sensitive     CEFAZOLIN <=4 SENSITIVE Sensitive     CEFTRIAXONE <=1 SENSITIVE Sensitive     CIPROFLOXACIN <=0.25 SENSITIVE Sensitive     GENTAMICIN >=16 RESISTANT Resistant     IMIPENEM 2 SENSITIVE Sensitive     NITROFURANTOIN 128 RESISTANT Resistant     TRIMETH/SULFA <=20 SENSITIVE Sensitive      AMPICILLIN/SULBACTAM <=2 SENSITIVE Sensitive     PIP/TAZO <=4 SENSITIVE Sensitive     * >=100,000 COLONIES/mL PROTEUS MIRABILIS  Urine culture     Status: None   Collection Time: 11/23/14 11:24 PM  Result Value Ref Range Status   Specimen Description URINE, RANDOM  Final   Special Requests NONE  Final   Culture   Final    >=100,000 COLONIES/mL PROTEUS MIRABILIS Performed at [facility]    Report Status 11/26/2014 FINAL  Final   Organism ID, Bacteria PROTEUS MIRABILIS  Final      Susceptibility   Proteus mirabilis - MIC*    AMPICILLIN <=2 SENSITIVE Sensitive     CEFAZOLIN <=4 SENSITIVE Sensitive     CEFTRIAXONE <=1 SENSITIVE Sensitive     CIPROFLOXACIN <=0.25 SENSITIVE Sensitive     GENTAMICIN >=16 RESISTANT Resistant     IMIPENEM 2 SENSITIVE Sensitive     NITROFURANTOIN 128 RESISTANT Resistant     TRIMETH/SULFA <=20 SENSITIVE Sensitive     AMPICILLIN/SULBACTAM <=2 SENSITIVE Sensitive     PIP/TAZO <=4 SENSITIVE Sensitive     * >=100,000 COLONIES/mL PROTEUS MIRABILIS  Blood culture (routine x 2)     Status: None (Preliminary result)   Collection Time: 11/24/14  1:22 AM  Result Value Ref Range Status   Specimen Description BLOOD LEFT FOREARM  Final   Special Requests BOTTLES DRAWN AEROBIC AND ANAEROBIC 5CC  Final   Culture  Setup Time   Final    GRAM NEGATIVE RODS IN BOTH AEROBIC AND ANAEROBIC BOTTLES CRITICAL RESULT CALLED TO, READ BACK BY AND VERIFIED WITH: Lynnae January RN 2200 11/24/14 A BROWNING CONFIRMED BY M CAMPBELL    Culture   Final    PROTEUS MIRABILIS Performed at [facility]    Report Status PENDING  Incomplete   Organism ID, Bacteria PROTEUS MIRABILIS  Final      Susceptibility   Proteus mirabilis - MIC*    AMPICILLIN <=2 SENSITIVE Sensitive     CEFAZOLIN <=4 SENSITIVE Sensitive     CEFEPIME <=1 SENSITIVE Sensitive     CEFTAZIDIME <=1 SENSITIVE Sensitive     CEFTRIAXONE <=1 SENSITIVE Sensitive     CIPROFLOXACIN <=0.25 SENSITIVE Sensitive      GENTAMICIN >=  16 RESISTANT Resistant     IMIPENEM 0.5 SENSITIVE Sensitive     TRIMETH/SULFA <=20 SENSITIVE Sensitive     AMPICILLIN/SULBACTAM <=2 SENSITIVE Sensitive     PIP/TAZO <=4 SENSITIVE Sensitive     * PROTEUS MIRABILIS  Blood culture (routine x 2)     Status: None (Preliminary result)   Collection Time: 11/24/14  1:22 AM  Result Value Ref Range Status   Specimen Description RIGHT ANTECUBITAL  Final   Special Requests BOTTLES DRAWN AEROBIC AND ANAEROBIC 5CC  Final   Culture  Setup Time   Final    GRAM NEGATIVE RODS ANAEROBIC BOTTLE ONLY CRITICAL RESULT CALLED TO, READ BACK BY AND VERIFIED WITH: Lynnae January RN 2200 11/24/14 A BROWNING CONFIRMED BY M CAMPBELL    Culture   Final    PROTEUS MIRABILIS SUSCEPTIBILITIES PERFORMED ON PREVIOUS CULTURE WITHIN THE LAST 5 DAYS. Performed at [facility]    Report Status PENDING  Incomplete  Culture, blood (routine x 2)     Status: None (Preliminary result)   Collection Time: 11/25/14 12:32 PM  Result Value Ref Range Status   Specimen Description BLOOD RIGHT ANTECUBITAL  Final   Special Requests BOTTLES DRAWN AEROBIC AND ANAEROBIC 10CC EACH  Final   Culture   Final    NO GROWTH 1 DAY Performed at [facility]    Report Status PENDING  Incomplete  Culture, blood (routine x 2)     Status: None (Preliminary result)   Collection Time: 11/25/14 12:36 PM  Result Value Ref Range Status   Specimen Description BLOOD RIGHT HAND  Final   Special Requests BOTTLES DRAWN AEROBIC ONLY 3CC  Final   Culture   Final    NO GROWTH 1 DAY Performed at [facility]    Report Status PENDING  Incomplete    RADIOLOGY STUDIES/RESULTS: Ct Abdomen Pelvis Wo Contrast  11/24/2014   CLINICAL DATA:  Acute onset of fever and weakness. Right lower quadrant tenderness. Recent CT-guided biopsy of right paraspinal mass. Initial encounter.  EXAM: CT ABDOMEN AND PELVIS WITHOUT CONTRAST  TECHNIQUE: Multidetector CT imaging of the abdomen and pelvis  was performed following the standard protocol without IV contrast.  COMPARISON:  CT of the abdomen and pelvis performed 06/23/2013, and lumbar spine CT performed 11/08/2014  FINDINGS: Mild bibasilar atelectasis is noted. This is somewhat nodular at the left lung base, measuring 1.0 cm. Diffuse coronary artery calcifications are seen.  The liver and spleen are unremarkable in appearance. The patient is status post cholecystectomy, with clips noted at the gallbladder fossa. The pancreas and adrenal glands are unremarkable.  There is mild right-sided hydronephrosis, with prominence of the proximal to mid right ureter. This appears to reflect partial encasement of the mid right ureter by the patient's large right paraspinal mass.  Nonspecific perinephric stranding is noted on the right. A few scattered hypodensities and hyperdensities within the kidneys likely reflect cysts. No renal or ureteral stones are identified. There is mild bilateral renal scarring.  No free fluid is identified. The small bowel is unremarkable in appearance. The stomach is within normal limits. No acute vascular abnormalities are seen. Diffuse calcification is seen along the abdominal aorta and its branches.  The patient is status post appendectomy. Scattered diverticulosis is noted at the splenic flexure of the colon, and along the descending and sigmoid colon, without evidence of diverticulitis.  The bladder is mildly distended and grossly unremarkable. The patient is status post hysterectomy. No suspicious adnexal masses are seen.  No inguinal lymphadenopathy is seen.  There is a large 12.0 x 8.1 x 6.0 cm mass arising along the right side of the lower lumbar spine, with diffuse erosion into vertebral bodies at L4, L5 and S1, obscuring the exiting nerve roots on the right at these levels. No definite extension into the spinal canal is yet seen. The mass underlies the right psoas muscle and tracks about the distal IVC and right common iliac  vein. There is diffuse encasement of the right internal iliac artery. Mass extends inferiorly medial to the right iliacus muscle.  No additional osseous abnormalities are identified.  IMPRESSION: 1. Large 12.0 x 8.1 x 6.0 cm right paraspinal mass is likely mildly increased in size from recent prior CT, with diffuse erosion into the vertebral bodies at L4, L5 and S1, obscuring the exiting nerve roots on the right at these levels. No definite extension into the spinal canal. Mass underlies the right psoas muscle and tracks about the distal SVC and right common iliac vein. Diffuse encasement of the right internal iliac artery. 2. Mild right-sided hydronephrosis, with prominence of the proximal to mid right ureter. This appears to reflect partial encasement of the mid right ureter by the large right paraspinal mass. 3. Nodule at the left lung base, measuring 1.0 cm, of uncertain significance. This is apparently new from 80. Mild underlying bibasilar atelectasis noted. 4. Few scattered bilateral renal cysts. Mild bilateral renal scarring noted. 5. Diffuse calcification along the abdominal aorta and its branches. 6. Scattered diverticulosis of the splenic flexure of the colon, and along the descending and sigmoid colon, without evidence of diverticulitis.   Electronically Signed   By: Garald Balding M.D.   On: 11/24/2014 00:31   Ct Lumbar Spine Wo Contrast  11/08/2014   CLINICAL DATA:  Low back pain for 3 weeks. Two falls in 1 day. Severe right hip and right leg pain. Personal history of stage IV non-small cell lung cancer. Prior chemotherapy. No current therapy.  EXAM: CT LUMBAR SPINE WITHOUT CONTRAST  TECHNIQUE: Multidetector CT imaging of the lumbar spine was performed without intravenous contrast administration. Multiplanar CT image reconstructions were also generated.  COMPARISON:  Lumbar spine radiographs 10/25/2014. CT abdomen and pelvis 06/23/2013  FINDINGS: Vertebral alignment is unchanged with grade 1  anterolisthesis of L3 on L4 and L4 on L5 and grade 1 retrolisthesis of L5 on S1. There is slight lumbar levoscoliosis. Disc space narrowing is mild at L4-5 and moderate to severe at L5-S1 and with associated vacuum disc phenomenon.  There is a new, large right-sided lumbosacral paravertebral soft tissue mass which measures approximately 6.9 x 7.1 x 10.5 cm. This invades the lateral aspects of the L5 greater than L4 vertebral bodies. There is involvement of the right L5 pedicle and transverse process, and there is also prominent involvement of the right sacral ala and right S1 vertebral body. The mass displaces the right psoas muscle laterally and encases the right internal iliac artery as well as right common, internal and external iliac veins. It also partially encircles the lower most IVC. There is a pathologic compression fracture of the right half of the L5 vertebral body with mild vertebral body height loss. Tumor extends into the superior aspect of the right L5-S1 neural foramen.  Extensive atherosclerotic vascular calcification is present in the abdomen and pelvis. The kidneys are atrophic with similar appearance of small hypodense renal lesions most likely representing cysts. Cholecystectomy clips are noted.  L1-2: Mild disc bulging without significant stenosis.  L2-3: Mild disc bulging and mild facet and ligamentum flavum hypertrophy result in mild right lateral recess stenosis and mild right and minimal left neural foraminal stenosis without significant spinal stenosis.  L3-4: Listhesis with disc uncovering, ligamentum flavum hypertrophy, and severe bilateral facet arthrosis result in mild to moderate right and minimal left neural foraminal narrowing without significant spinal stenosis.  L4-5: Listhesis with disc uncovering and moderate facet arthrosis without significant stenosis. The right L4 nerve root is likely involved by tumor lateral to the foramen.  L5-S1: Listhesis with disc uncovering and mild  facet arthrosis without significant stenosis. The right L5 nerve root is likely involved by tumor lateral to the foramen.  IMPRESSION: New 11 cm destructive right-sided paravertebral soft tissue mass at the lumbosacral junction, consistent with metastasis. There is osseous involvement at L4, L5, and S1 with pathologic L5 compression fracture. Right-sided iliac arterial and venous encasement as above.  These results were called by telephone at the time of interpretation on 11/08/2014 at 4:01 pm to Dr. Arther Abbott , who verbally acknowledged these results.   Electronically Signed   By: Logan Bores   On: 11/08/2014 16:05   US Venous Img Lower Unilateral Right  11/08/2014   CLINICAL DATA:  Right lower extremity pain and edema. History of smoking. History of lung cancer. Evaluate for DVT.  EXAM: RIGHT LOWER EXTREMITY VENOUS DOPPLER ULTRASOUND  TECHNIQUE: Gray-scale sonography with graded compression, as well as color Doppler and duplex ultrasound were performed to evaluate the lower extremity deep venous systems from the level of the common femoral vein and including the common femoral, femoral, profunda femoral, popliteal and calf veins including the posterior tibial, peroneal and gastrocnemius veins when visible. The superficial great saphenous vein was also interrogated. Spectral Doppler was utilized to evaluate flow at rest and with distal augmentation maneuvers in the common femoral, femoral and popliteal veins.  COMPARISON:  None.  FINDINGS: Contralateral Common Femoral Vein: Respiratory phasicity is normal and symmetric with the symptomatic side. No evidence of thrombus. Normal compressibility.  Common Femoral Vein: No evidence of thrombus. Normal compressibility, respiratory phasicity and response to augmentation.  Saphenofemoral Junction: No evidence of thrombus. Normal compressibility and flow on color Doppler imaging.  Profunda Femoral Vein: No evidence of thrombus. Normal compressibility and flow on  color Doppler imaging.  Femoral Vein: No evidence of thrombus. Normal compressibility, respiratory phasicity and response to augmentation.  Popliteal Vein: No evidence of thrombus. Normal compressibility, respiratory phasicity and response to augmentation.  Calf Veins: No evidence of thrombus. Normal compressibility and flow on color Doppler imaging.  Superficial Great Saphenous Vein: No evidence of thrombus. Normal compressibility and flow on color Doppler imaging.  Venous Reflux:  None.  Other Findings: Subcutaneous edema is noted at the level of the calf and ankle (representative images 38, 39 and 40).  IMPRESSION: No evidence of DVT within the right lower extremity.   Electronically Signed   By: Sandi Mariscal M.D.   On: 11/08/2014 15:41   Ct Biopsy  11/23/2014   INDICATION: History of lung cancer, now with infiltrative right-sided paraspinal mass adjacent to the caudal aspect of the lumbar spine extending to involve the lumbar sacral junction. Please perform CT-guided biopsy for tissue diagnostic purposes  EXAM: CT-GUIDED BIOPSY OF INFILTRATIVE RIGHT PARASPINAL MASS.  COMPARISON:  Lumbar spine CT - 11/08/2014  MEDICATIONS: Fentanyl 25 mcg IV; Versed 1 mg IV  ANESTHESIA/SEDATION: Sedation time  8 minutes  CONTRAST:  None  COMPLICATIONS: None immediate  PROCEDURE: Informed consent  was obtained from the patient following an explanation of the procedure, risks, benefits and alternatives. A time out was performed prior to the initiation of the procedure.  The patient was positioned prone on the CT table and a limited CT was performed for procedural planning demonstrating unchanged appearance of the note expansile paraspinal mass adjacent to the caudal aspect of the lumbar spine measuring at least 5.6 x 7.3 cm in diameter (image 16, series 2). The procedure was planned. The operative site was prepped and draped in the usual sterile fashion. Appropriate trajectory was confirmed with a 22 gauge spinal needle after the  adjacent tissues were anesthetized with 1% Lidocaine with epinephrine.  Under intermittent CT guidance, a 17 gauge coaxial needle was advanced into the peripheral aspect of the mass. Appropriate positioning was confirmed and 6 core needle biopsy samples were obtained with an 18 gauge core needle biopsy device. The co-axial needle was removed and hemostasis was achieved with manual compression.  A limited postprocedural CT was negative for hemorrhage or additional complication. A dressing was placed. The patient tolerated the procedure well without immediate postprocedural complication.  IMPRESSION: Technically successful CT guided core needle biopsy of expansile right-sided paraspinal mass adjacent to the caudal aspect of the lumbar spine.   Electronically Signed   By: Sandi Mariscal M.D.   On: 11/23/2014 10:13   Dg Chest Port 1 View  11/24/2014   CLINICAL DATA:  Fever.  Possible sepsis.  Initial encounter.  EXAM: PORTABLE CHEST - 1 VIEW  COMPARISON:  Chest radiograph performed 07/29/2014, and CT of the chest performed 06/27/2014  FINDINGS: The lungs are well-aerated. Vascular congestion is noted, with mild bibasilar scarring. There is no evidence of pleural effusion or pneumothorax. Known left-sided pulmonary nodules are partially characterized on radiograph.  The cardiomediastinal silhouette is mildly enlarged. No acute osseous abnormalities are seen.  IMPRESSION: 1. Vascular congestion and mild cardiomegaly, with mild bibasilar scarring. No definite evidence of pneumonia. 2. Known left-sided pulmonary nodules are partially characterized on radiograph.   Electronically Signed   By: Garald Balding M.D.   On: 11/24/2014 06:59   Ir Nephrostomy Placement Right  11/24/2014   CLINICAL DATA:  81 year old with a right paraspinal mass, mild right hydronephrosis and urinary infection. Request for percutaneous nephrostomy tube.  EXAM: RIGHT PERCUTANEOUS NEPHROSTOMY TUBE PLACEMENT WITH ULTRASOUND AND FLUOROSCOPIC GUIDANCE   Physician: Stephan Minister. Henn, MD  FLUOROSCOPY TIME:  5 minutes and 6 seconds.  37  mGy  MEDICATIONS: 0.5 mg Versed, 25 mcg fentanyl. A radiology nurse monitored the patient for moderate sedation.  Patient is already receiving antibiotics and additional antibiotics were not given for this procedure.  ANESTHESIA/SEDATION: Moderate sedation time: 26 minutes  PROCEDURE: Informed consent was obtained for a percutaneous nephrostomy tube. Patient was placed prone. The right kidney was identified with ultrasound. The right flank was prepped and draped in sterile fashion. Maximal barrier sterile technique was utilized including caps, mask, sterile gowns, sterile gloves, sterile drape, hand hygiene and skin antiseptic. Skin was anesthetized with 1% lidocaine. A 22 gauge needle was directed into the right mid/lower pole collecting system with ultrasound guidance. Contrast injection confirmed placement in the renal collecting system. A 0.018 wire was advanced. An Accustick dilator set was placed. Tract was dilated to accommodate a 10.2 [place] multipurpose drain. The catheter was reconstituted in the renal pelvis. Contrast injection confirmed appropriate positioning. Catheter was attached to a gravity bag and sutured in place. Bandage was placed at the catheter site. Fluoroscopic and ultrasound  images were taken and saved for documentation.  FINDINGS: Mild dilatation of the right renal collecting system. Access was obtained from the mid/lower pole. Small amount of contrast was draining distal to the obstructing lesion.  Estimated blood loss: Minimal  COMPLICATIONS: None  IMPRESSION: Successful placement of a right percutaneous nephrostomy tube with ultrasound and fluoroscopic guidance.   Electronically Signed   By: Markus Daft M.D.   On: 11/24/2014 17:11    Oren Binet, MD  [facility] Pager:[phone]  If 7PM-7AM, please contact night-coverage [email or web address] Password [email or web address] 11/27/2014, 8:20 AM   LOS: 3 days

## 2014-11-27 NOTE — Evaluation (Signed)
Occupational Therapy Evaluation Patient Details Name: [name] MRN: [id number] DOB: 09-20-1933 Today's Date: 11/27/2014    History of Present Illness [name] is a 81 y.o. female with history of stage IV lung cancer who was recently diagnosed with a right paraspinal mass and had undergone biopsy 11/23/14 following which patient was discharged home and patient was experiencing fever chills. Since the fever chills persisted patient came back to the ER. Patient also is complaining of some right sided flank pain. CT abdomen and pelvis shows mild hydronephrosis with tumor encasing the ureter. UA was showing features consistent with UTI. Patient has been admitted for further management of UTI with possible developing sepsis. Patient has been having increasing right lower extremity edema. positive for  bilateral DVT's   Clinical Impression   Pt admitted with fever. Pt currently with functional limitations due to the deficits listed below (see OT Problem List).  Pt will benefit from skilled OT to increase their safety and independence with ADL and functional mobility for ADL to facilitate discharge to venue listed below.      Follow Up Recommendations  SNF    Equipment Recommendations  None recommended by OT    Recommendations for Other Services       Precautions / Restrictions Precautions Precautions: Fall Precaution Comments: RLE weakness      Mobility Bed Mobility Overal bed mobility: Needs Assistance Bed Mobility: Supine to Sit     Supine to sit: Mod assist        Transfers Overall transfer level: Needs assistance Equipment used: 2 person hand held assist Transfers: Sit to/from Stand Sit to Stand: Mod assist                   ADL Overall ADL's : Needs assistance/impaired Eating/Feeding: Set up;Sitting   Grooming: Set up;Sitting   Upper Body Bathing: Set up;Sitting   Lower Body Bathing: Moderate assistance;Sit to/from stand   Upper Body  Dressing : Set up;Sitting   Lower Body Dressing: Moderate assistance;Sit to/from stand   Toilet Transfer: Moderate assistance;BSC;Cueing for sequencing;Cueing for safety   Toileting- Clothing Manipulation and Hygiene: Sit to/from stand;Cueing for sequencing;Cueing for safety;Maximal assistance                         Pertinent Vitals/Pain Pain Score: 8  Pain Location: r leg Pain Descriptors / Indicators: Discomfort Pain Intervention(s): Limited activity within patient's tolerance;Monitored during session;Repositioned     Hand Dominance     Extremity/Trunk Assessment Upper Extremity Assessment Upper Extremity Assessment: Generalized weakness           Communication Communication Communication: No difficulties   Cognition Arousal/Alertness: Awake/alert Behavior During Therapy: WFL for tasks assessed/performed Overall Cognitive Status: Within Functional Limits for tasks assessed                                Home Living Family/patient expects to be discharged to:: Private residence Living Arrangements: Children Available Help at Discharge: Family Type of Home: Mobile home Home Access: Stairs to enter Technical brewer of Steps: 6 Entrance Stairs-Rails: Right;Left Home Layout: One level     Bathroom Shower/Tub: Occupational psychologist: Standard     Home Equipment: Cane - single point;Walker - standard          Prior Functioning/Environment Level of Independence: Needs assistance  Gait / Transfers Assistance Needed: recently required assistance due to  LE weakness          OT Diagnosis: Generalized weakness   OT Problem List: Decreased strength;Decreased activity tolerance;Decreased safety awareness   OT Treatment/Interventions: Self-care/ADL training;DME and/or AE instruction;Patient/family education    OT Goals(Current goals can be found in the care plan section) Acute Rehab OT Goals Patient Stated Goal: to go to  rehab then home OT Goal Formulation: With patient Time For Goal Achievement: 12/04/14 Potential to Achieve Goals: Good ADL Goals Pt Will Perform Grooming: with modified independence;standing Pt Will Transfer to Toilet: with modified independence;ambulating;regular height toilet Pt Will Perform Toileting - Clothing Manipulation and hygiene: with modified independence;sit to/from stand  OT Frequency: Min 2X/week    End of Session Nurse Communication: Mobility status  Activity Tolerance: Patient tolerated treatment well Patient left:     Time: 3754-3606 OT Time Calculation (min): 30 min Charges:  OT General Charges $OT Visit: 1 Procedure OT Evaluation $Initial OT Evaluation Tier I: 1 Procedure OT Treatments $Self Care/Home Management : 8-22 mins G-Codes:    Payton Mccallum D December 25, 2014, 12:21 PM

## 2014-11-28 ENCOUNTER — Inpatient Hospital Stay (HOSPITAL_COMMUNITY): Payer: Medicare Other

## 2014-11-28 ENCOUNTER — Ambulatory Visit
Admit: 2014-11-28 | Discharge: 2014-11-28 | Disposition: A | Discharge status: Home / Self Care | Payer: Medicare Other | Attending: Radiation Oncology | Admitting: Radiation Oncology

## 2014-11-28 DIAGNOSIS — C7951 Secondary malignant neoplasm of bone: Secondary | ICD-10-CM

## 2014-11-28 DIAGNOSIS — I2510 Atherosclerotic heart disease of native coronary artery without angina pectoris: Secondary | ICD-10-CM

## 2014-11-28 LAB — BASIC METABOLIC PANEL
Anion gap: 10 (ref 5–15)
BUN: 55 mg/dL — ABNORMAL HIGH (ref 6–20)
CO2: 22 mmol/L (ref 22–32)
Calcium: 8.6 mg/dL — ABNORMAL LOW (ref 8.9–10.3)
Chloride: 102 mmol/L (ref 101–111)
Creatinine, Ser: 2.56 mg/dL — ABNORMAL HIGH (ref 0.44–1.00)
GFR calc Af Amer: 19 mL/min — ABNORMAL LOW (ref >60)
GFR, EST NON AFRICAN AMERICAN: 17 mL/min — AB (ref >60)
GLUCOSE: 87 mg/dL (ref 65–99)
POTASSIUM: 4.9 mmol/L (ref 3.5–5.1)
Sodium: 134 mmol/L — ABNORMAL LOW (ref 135–145)

## 2014-11-28 LAB — HEPARIN ANTI-XA: Heparin LMW: 0.38 IU/mL

## 2014-11-28 MED ORDER — LORAZEPAM 2 MG/ML IJ SOLN
0.5000 mg | Freq: Once | INTRAMUSCULAR | Status: AC | PRN
  Administered 2014-11-28: 1 mg via INTRAVENOUS
  Filled 2014-11-28: qty 1

## 2014-11-28 MED ORDER — ENOXAPARIN SODIUM 80 MG/0.8ML ~~LOC~~ SOLN
80.0000 mg | SUBCUTANEOUS | Status: DC
  Administered 2014-11-29: 80 mg via SUBCUTANEOUS
  Filled 2014-11-28: qty 0.8

## 2014-11-28 NOTE — Progress Notes (Addendum)
PATIENT DETAILS Name: [name] Age: 81 y.o. Sex: female Date of Birth: [birth year] Admit Date: 11/23/2014 Admitting Physician Rise Patience, MD ZOX:WRUE, [facility], MD  Brief narrative:  81 year old female with known history of stage IV lung cancer on observation admitted with worsening back pain and right lower leg swelling.Further evaluation with a CT scan showed a large right paraspinal mass encasing the right ureter and causing hydronephrosis. She was also found to have a right lower extremity DVT. Patient subsequently underwent CT-guided biopsy on 6/22 which shows metastatic squamous cell cancer. Patient also underwent right percutaneous nephrostomy, further workup demonstrated Proteus UTI and bacteremia.  Subjective: Continues to have right leg pain with swelling, and right-sided back pain.   Assessment/Plan: Principal Problem: Proteus bacteremia with sepsis: Likely etiology UTI. Continue Primaxin-has numerous allergies-mostly generalized hives-may just need to place midline PICC and continue with IV Abx for a total of 2 weeks. CT abdomen does not show any other foci of infection  Active Problems: Large right paraspinal mass: Known stage IV lung cancer and was on observation with close follow-up with oncology. Patient underwent CT-guided biopsy on 6/22, which shows metastatic squamous carcinoma. Evaluated by Dr. Julien Nordmann, recommending radiation oncology evaluation.   Right Foot Drop:occurred 5 weeks prior to admission (per patient)-likely secondary to right parapsinal mass. MRI of her lumbar spine showed a large right paraspinal mass with osseous infusion, with it affecting the right L4, L5 and S1 no bruit. Will check MRI to further delineate. Await Rad Onc evaluation  Bilateral lower extremity DVT: Likely from above mass encasing vessels. Started on IV heparin and overlapping Coumadin-this M.D. spoke with Dr. Mohamed-recommendations were to see if pharmacy could  dose Lovenox given active cancer. Has now been started on Lovenox, pharmacy managing.  Acute on chronic kidney disease stage IV: Acute renal failure likely secondary to hydronephrosis/obstruction. Improving and now close to usual baseline. Continue to follow closely.  Hydronephrosis of the right kidney: Secondary to a large right paraspinal mass encasing the right ureter. Subsequently underwent right percutaneous nephrostomy tube placement. Urology following.  Anemia: Likely secondary to acute acute illness-has anemia of chronic kidney disease/malignancy. Hb remains stable. Follow and transfuse as needed.  Chronic diastolic heart failure: Compensated. Continue to hold Lasix for now.  History of CAD: Currently without chest pain or shortness of breath. Has history of DES to RCA in 2007-and was on Plavix prior to admission-Held for biopsy. We will speak with primary cardiologist on 6/27-to see if we can perhaps discontinue-as patient will be on anticoagulation. In the meantime, continue with metoprolol.  Addendum 1041: Spoke with Dr. Burt Knack over the phone, he reviewed the chart, since patient now with worsening cancer burden, worsening anemia-and since patient actually had a bare metal stent-okay to leave off Plavix as patient will be on Lovenox.  Hypertension: Uncontrolled-change clonidine to TID and increase metoprolol to 50 mg daily. Follow and titrate accordingly  Disposition: Remain inpatient-SNF in 1-2 days  Antimicrobial agents  See below  Anti-infectives    Start     Dose/Rate Route Frequency Ordered Stop   11/24/14 1800  imipenem-cilastatin (PRIMAXIN) 250 mg in sodium chloride 0.9 % 100 mL IVPB     250 mg 200 mL/hr over 30 Minutes Intravenous Every 12 hours 11/24/14 0234     11/24/14 0800  vancomycin (VANCOCIN) IVPB 1000 mg/200 mL premix  Status:  Discontinued     1,000 mg 200 mL/hr  over 60 Minutes Intravenous Every 48 hours 11/24/14 0700 11/25/14 0410   11/24/14 0145   imipenem-cilastatin (PRIMAXIN) 500 mg in sodium chloride 0.9 % 100 mL IVPB     500 mg 200 mL/hr over 30 Minutes Intravenous  Once 11/24/14 0142 11/24/14 0418      DVT Prophylaxis: On Lovenox   Code Status: Full code   Family Communication None at bedside  Procedures: Percutaneous right nephrostomy on 6/23 CT-guided biopsy of right paraspinal Mass on 6/22  CONSULTS:  hematology/oncology and urology  Time spent 25 minutes-Greater than 50% of this time was spent in counseling, explanation of diagnosis, planning of further management, and coordination of care.  MEDICATIONS: Scheduled Meds: . cloNIDine  0.1 mg Oral TID  . enoxaparin (LOVENOX) injection  1 mg/kg Subcutaneous Q24H  . enoxaparin   Does not apply Once  . imipenem-cilastatin  250 mg Intravenous Q12H  . metoprolol succinate  25 mg Oral Daily  . pantoprazole  40 mg Oral Daily  . polyethylene glycol  17 g Oral Daily  . senna-docusate  1 tablet Oral BID   Continuous Infusions:  PRN Meds:.acetaminophen **OR** acetaminophen, LORazepam, morphine injection, ondansetron **OR** ondansetron (ZOFRAN) IV, sorbitol    PHYSICAL EXAM: Vital signs in last 24 hours: Filed Vitals:   11/27/14 0511 11/27/14 1500 11/27/14 2020 11/28/14 0553  BP: 172/65 175/66 170/63 159/63  Pulse: 72 74 77 78  Temp: 98.2 F (36.8 C) 97.6 F (36.4 C) 97.9 F (36.6 C) 98.2 F (36.8 C)  TempSrc: Axillary Oral Oral Oral  Resp: '16 18 16 16  '$ Height:      Weight:      SpO2: 97% 100% 99% 97%    Weight change:  Filed Weights   11/24/14 0440 11/25/14 1203  Weight: 65.772 kg (145 lb) 70 kg (154 lb 5.2 oz)   Body mass index is 23.12 kg/(m^2).   Gen Exam: Awake and alert with clear speech.   Neck: Supple, No JVD.   Chest: B/L Clear.  No rales or rhonchi CVS: S1 S2 Regular, no murmurs.  Abdomen: soft, BS +, non tender, non distended.  Extremities: no edema, lower extremities warm to touch. Continues to have significant swelling on the right  lower extremity-unchanged over the past few days Neurologic: Non Focal.  Right Foot drop Skin: No Rash.   Wounds: N/A.    Intake/Output from previous day:  Intake/Output Summary (Last 24 hours) at 11/28/14 1020 Last data filed at 11/28/14 0554  Gross per 24 hour  Intake      0 ml  Output    450 ml  Net   -450 ml     LAB RESULTS: CBC  Recent Labs Lab 11/23/14 2148 11/24/14 0555 11/25/14 0650 11/26/14 0137 11/27/14 0426  WBC 18.0* 14.3* 10.5 9.9 9.3  HGB 9.8* 9.2* 8.5* 8.1* 8.6*  HCT 30.6* 29.1* 26.5* 25.3* 27.0*  PLT 323 278 240 247 272  MCV 81.6 81.5 81.5 80.1 81.8  MCH 26.1 25.8* 26.2 25.6* 26.1  MCHC 32.0 31.6 32.1 32.0 31.9  RDW 14.1 14.3 14.4 14.3 14.6  LYMPHSABS 0.8 0.7  --   --   --   MONOABS 0.7 0.9  --   --   --   EOSABS 0.0 0.0  --   --   --   BASOSABS 0.0 0.0  --   --   --     Chemistries   Recent Labs Lab 11/23/14 2148 11/24/14 0555 11/26/14 0137 11/27/14 0426 11/28/14 0425  NA 131* 132* 133* 134* 134*  K 3.9 4.1 4.3 4.4 4.9  CL 95* 96* 102 101 102  CO2 21* 20* '22 23 22  '$ GLUCOSE 174* 121* 105* 100* 87  BUN 58* 55* 62* 57* 55*  CREATININE 3.31* 3.09* 2.94* 2.80* 2.56*  CALCIUM 8.5* 8.4* 8.1* 8.6* 8.6*    CBG:  Recent Labs Lab 11/24/14 1251  GLUCAP 123*    GFR Estimated Creatinine Clearance: 18.3 mL/min (by C-G formula based on Cr of 2.56).  Coagulation profile  Recent Labs Lab 11/23/14 0730 11/24/14 0700 11/26/14 0137  INR 1.22 1.35 1.25    Cardiac Enzymes No results for input(s): CKMB, TROPONINI, MYOGLOBIN in the last 168 hours.  Invalid input(s): CK  Invalid input(s): POCBNP No results for input(s): DDIMER in the last 72 hours. No results for input(s): HGBA1C in the last 72 hours. No results for input(s): CHOL, HDL, LDLCALC, TRIG, CHOLHDL, LDLDIRECT in the last 72 hours. No results for input(s): TSH, T4TOTAL, T3FREE, THYROIDAB in the last 72 hours.  Invalid input(s): FREET3 No results for input(s): VITAMINB12,  FOLATE, FERRITIN, TIBC, IRON, RETICCTPCT in the last 72 hours. No results for input(s): LIPASE, AMYLASE in the last 72 hours.  Urine Studies No results for input(s): UHGB, CRYS in the last 72 hours.  Invalid input(s): UACOL, UAPR, USPG, UPH, UTP, UGL, UKET, UBIL, UNIT, UROB, ULEU, UEPI, UWBC, URBC, UBAC, CAST, UCOM, BILUA  MICROBIOLOGY: Recent Results (from the past 240 hour(s))  Urine culture     Status: None   Collection Time: 11/23/14  8:22 AM  Result Value Ref Range Status   Specimen Description URINE, CLEAN CATCH  Final   Special Requests NONE  Final   Culture   Final    >=100,000 COLONIES/mL PROTEUS MIRABILIS Performed at [facility]    Report Status 11/26/2014 FINAL  Final   Organism ID, Bacteria PROTEUS MIRABILIS  Final      Susceptibility   Proteus mirabilis - MIC*    AMPICILLIN <=2 SENSITIVE Sensitive     CEFAZOLIN <=4 SENSITIVE Sensitive     CEFTRIAXONE <=1 SENSITIVE Sensitive     CIPROFLOXACIN <=0.25 SENSITIVE Sensitive     GENTAMICIN >=16 RESISTANT Resistant     IMIPENEM 2 SENSITIVE Sensitive     NITROFURANTOIN 128 RESISTANT Resistant     TRIMETH/SULFA <=20 SENSITIVE Sensitive     AMPICILLIN/SULBACTAM <=2 SENSITIVE Sensitive     PIP/TAZO <=4 SENSITIVE Sensitive     * >=100,000 COLONIES/mL PROTEUS MIRABILIS  Urine culture     Status: None   Collection Time: 11/23/14 11:24 PM  Result Value Ref Range Status   Specimen Description URINE, RANDOM  Final   Special Requests NONE  Final   Culture   Final    >=100,000 COLONIES/mL PROTEUS MIRABILIS Performed at [facility]    Report Status 11/26/2014 FINAL  Final   Organism ID, Bacteria PROTEUS MIRABILIS  Final      Susceptibility   Proteus mirabilis - MIC*    AMPICILLIN <=2 SENSITIVE Sensitive     CEFAZOLIN <=4 SENSITIVE Sensitive     CEFTRIAXONE <=1 SENSITIVE Sensitive     CIPROFLOXACIN <=0.25 SENSITIVE Sensitive     GENTAMICIN >=16 RESISTANT Resistant     IMIPENEM 2 SENSITIVE Sensitive      NITROFURANTOIN 128 RESISTANT Resistant     TRIMETH/SULFA <=20 SENSITIVE Sensitive     AMPICILLIN/SULBACTAM <=2 SENSITIVE Sensitive     PIP/TAZO <=4 SENSITIVE Sensitive     * >=100,000 COLONIES/mL PROTEUS MIRABILIS  Blood culture (routine x 2)     Status: None   Collection Time: 11/24/14  1:22 AM  Result Value Ref Range Status   Specimen Description BLOOD LEFT FOREARM  Final   Special Requests BOTTLES DRAWN AEROBIC AND ANAEROBIC 5CC  Final   Culture  Setup Time   Final    GRAM NEGATIVE RODS IN BOTH AEROBIC AND ANAEROBIC BOTTLES CRITICAL RESULT CALLED TO, READ BACK BY AND VERIFIED WITH: Lynnae January RN 2200 11/24/14 A BROWNING CONFIRMED BY M CAMPBELL    Culture   Final    PROTEUS MIRABILIS Performed at [facility]    Report Status 11/27/2014 FINAL  Final   Organism ID, Bacteria PROTEUS MIRABILIS  Final      Susceptibility   Proteus mirabilis - MIC*    AMPICILLIN <=2 SENSITIVE Sensitive     CEFAZOLIN <=4 SENSITIVE Sensitive     CEFEPIME <=1 SENSITIVE Sensitive     CEFTAZIDIME <=1 SENSITIVE Sensitive     CEFTRIAXONE <=1 SENSITIVE Sensitive     CIPROFLOXACIN <=0.25 SENSITIVE Sensitive     GENTAMICIN >=16 RESISTANT Resistant     IMIPENEM 0.5 SENSITIVE Sensitive     TRIMETH/SULFA <=20 SENSITIVE Sensitive     AMPICILLIN/SULBACTAM <=2 SENSITIVE Sensitive     PIP/TAZO <=4 SENSITIVE Sensitive     * PROTEUS MIRABILIS  Blood culture (routine x 2)     Status: None   Collection Time: 11/24/14  1:22 AM  Result Value Ref Range Status   Specimen Description RIGHT ANTECUBITAL  Final   Special Requests BOTTLES DRAWN AEROBIC AND ANAEROBIC 5CC  Final   Culture  Setup Time   Final    GRAM NEGATIVE RODS ANAEROBIC BOTTLE ONLY CRITICAL RESULT CALLED TO, READ BACK BY AND VERIFIED WITH: Lynnae January RN 2200 11/24/14 A BROWNING CONFIRMED BY M CAMPBELL    Culture   Final    PROTEUS MIRABILIS SUSCEPTIBILITIES PERFORMED ON PREVIOUS CULTURE WITHIN THE LAST 5 DAYS. Performed at [facility]      Report Status 11/27/2014 FINAL  Final  Culture, blood (routine x 2)     Status: None (Preliminary result)   Collection Time: 11/25/14 12:32 PM  Result Value Ref Range Status   Specimen Description BLOOD RIGHT ANTECUBITAL  Final   Special Requests BOTTLES DRAWN AEROBIC AND ANAEROBIC 10CC EACH  Final   Culture   Final    NO GROWTH 2 DAYS Performed at [facility]    Report Status PENDING  Incomplete  Culture, blood (routine x 2)     Status: None (Preliminary result)   Collection Time: 11/25/14 12:36 PM  Result Value Ref Range Status   Specimen Description BLOOD RIGHT HAND  Final   Special Requests BOTTLES DRAWN AEROBIC ONLY 3CC  Final   Culture   Final    NO GROWTH 2 DAYS Performed at [facility]    Report Status PENDING  Incomplete    RADIOLOGY STUDIES/RESULTS: Ct Abdomen Pelvis Wo Contrast  11/24/2014   CLINICAL DATA:  Acute onset of fever and weakness. Right lower quadrant tenderness. Recent CT-guided biopsy of right paraspinal mass. Initial encounter.  EXAM: CT ABDOMEN AND PELVIS WITHOUT CONTRAST  TECHNIQUE: Multidetector CT imaging of the abdomen and pelvis was performed following the standard protocol without IV contrast.  COMPARISON:  CT of the abdomen and pelvis performed 06/23/2013, and lumbar spine CT performed 11/08/2014  FINDINGS: Mild bibasilar atelectasis is noted. This is somewhat nodular at the left lung base, measuring 1.0 cm. Diffuse  coronary artery calcifications are seen.  The liver and spleen are unremarkable in appearance. The patient is status post cholecystectomy, with clips noted at the gallbladder fossa. The pancreas and adrenal glands are unremarkable.  There is mild right-sided hydronephrosis, with prominence of the proximal to mid right ureter. This appears to reflect partial encasement of the mid right ureter by the patient's large right paraspinal mass.  Nonspecific perinephric stranding is noted on the right. A few scattered hypodensities and  hyperdensities within the kidneys likely reflect cysts. No renal or ureteral stones are identified. There is mild bilateral renal scarring.  No free fluid is identified. The small bowel is unremarkable in appearance. The stomach is within normal limits. No acute vascular abnormalities are seen. Diffuse calcification is seen along the abdominal aorta and its branches.  The patient is status post appendectomy. Scattered diverticulosis is noted at the splenic flexure of the colon, and along the descending and sigmoid colon, without evidence of diverticulitis.  The bladder is mildly distended and grossly unremarkable. The patient is status post hysterectomy. No suspicious adnexal masses are seen. No inguinal lymphadenopathy is seen.  There is a large 12.0 x 8.1 x 6.0 cm mass arising along the right side of the lower lumbar spine, with diffuse erosion into vertebral bodies at L4, L5 and S1, obscuring the exiting nerve roots on the right at these levels. No definite extension into the spinal canal is yet seen. The mass underlies the right psoas muscle and tracks about the distal IVC and right common iliac vein. There is diffuse encasement of the right internal iliac artery. Mass extends inferiorly medial to the right iliacus muscle.  No additional osseous abnormalities are identified.  IMPRESSION: 1. Large 12.0 x 8.1 x 6.0 cm right paraspinal mass is likely mildly increased in size from recent prior CT, with diffuse erosion into the vertebral bodies at L4, L5 and S1, obscuring the exiting nerve roots on the right at these levels. No definite extension into the spinal canal. Mass underlies the right psoas muscle and tracks about the distal SVC and right common iliac vein. Diffuse encasement of the right internal iliac artery. 2. Mild right-sided hydronephrosis, with prominence of the proximal to mid right ureter. This appears to reflect partial encasement of the mid right ureter by the large right paraspinal mass. 3.  Nodule at the left lung base, measuring 1.0 cm, of uncertain significance. This is apparently new from 89. Mild underlying bibasilar atelectasis noted. 4. Few scattered bilateral renal cysts. Mild bilateral renal scarring noted. 5. Diffuse calcification along the abdominal aorta and its branches. 6. Scattered diverticulosis of the splenic flexure of the colon, and along the descending and sigmoid colon, without evidence of diverticulitis.   Electronically Signed   By: Garald Balding M.D.   On: 11/24/2014 00:31   Ct Lumbar Spine Wo Contrast  11/08/2014   CLINICAL DATA:  Low back pain for 3 weeks. Two falls in 1 day. Severe right hip and right leg pain. Personal history of stage IV non-small cell lung cancer. Prior chemotherapy. No current therapy.  EXAM: CT LUMBAR SPINE WITHOUT CONTRAST  TECHNIQUE: Multidetector CT imaging of the lumbar spine was performed without intravenous contrast administration. Multiplanar CT image reconstructions were also generated.  COMPARISON:  Lumbar spine radiographs 10/25/2014. CT abdomen and pelvis 06/23/2013  FINDINGS: Vertebral alignment is unchanged with grade 1 anterolisthesis of L3 on L4 and L4 on L5 and grade 1 retrolisthesis of L5 on S1. There is slight lumbar levoscoliosis.  Disc space narrowing is mild at L4-5 and moderate to severe at L5-S1 and with associated vacuum disc phenomenon.  There is a new, large right-sided lumbosacral paravertebral soft tissue mass which measures approximately 6.9 x 7.1 x 10.5 cm. This invades the lateral aspects of the L5 greater than L4 vertebral bodies. There is involvement of the right L5 pedicle and transverse process, and there is also prominent involvement of the right sacral ala and right S1 vertebral body. The mass displaces the right psoas muscle laterally and encases the right internal iliac artery as well as right common, internal and external iliac veins. It also partially encircles the lower most IVC. There is a pathologic  compression fracture of the right half of the L5 vertebral body with mild vertebral body height loss. Tumor extends into the superior aspect of the right L5-S1 neural foramen.  Extensive atherosclerotic vascular calcification is present in the abdomen and pelvis. The kidneys are atrophic with similar appearance of small hypodense renal lesions most likely representing cysts. Cholecystectomy clips are noted.  L1-2: Mild disc bulging without significant stenosis.  L2-3: Mild disc bulging and mild facet and ligamentum flavum hypertrophy result in mild right lateral recess stenosis and mild right and minimal left neural foraminal stenosis without significant spinal stenosis.  L3-4: Listhesis with disc uncovering, ligamentum flavum hypertrophy, and severe bilateral facet arthrosis result in mild to moderate right and minimal left neural foraminal narrowing without significant spinal stenosis.  L4-5: Listhesis with disc uncovering and moderate facet arthrosis without significant stenosis. The right L4 nerve root is likely involved by tumor lateral to the foramen.  L5-S1: Listhesis with disc uncovering and mild facet arthrosis without significant stenosis. The right L5 nerve root is likely involved by tumor lateral to the foramen.  IMPRESSION: New 11 cm destructive right-sided paravertebral soft tissue mass at the lumbosacral junction, consistent with metastasis. There is osseous involvement at L4, L5, and S1 with pathologic L5 compression fracture. Right-sided iliac arterial and venous encasement as above.  These results were called by telephone at the time of interpretation on 11/08/2014 at 4:01 pm to Dr. Arther Abbott , who verbally acknowledged these results.   Electronically Signed   By: Logan Bores   On: 11/08/2014 16:05   Mr Brain Wo Contrast  11/28/2014   CLINICAL DATA:  Metastatic lung cancer, staging. No reported neurologic symptoms.  EXAM: MRI HEAD WITHOUT CONTRAST  TECHNIQUE: Multiplanar, multiecho pulse  sequences of the brain and surrounding structures were obtained without intravenous contrast.  COMPARISON:  CT head 07/28/2014.  FINDINGS: The patient was unable to remain motionless for the exam. Small or subtle lesions could be overlooked.  The patient was unable to receive gadolinium because of poor renal function. Sensitivity in the detection of metastatic disease is reduced.  No evidence for acute infarction, hemorrhage, mass lesion, hydrocephalus, or extra-axial fluid. Extensive atrophy. Severe chronic microvascular ischemic change. Prominent perivascular spaces. Tiny foci chronic hemorrhage LEFT lateral thalamus and RIGHT lentiform nucleus, likely hypertensive in nature.  Flow voids are maintained. Partial empty sella. No tonsillar herniation. Negative orbits. No sinus disease. Sclerotic mastoids.  IMPRESSION: Noncontrast motion degraded examination, demonstrating no definite intracranial metastatic disease.  Chronic changes as described with atrophy, small vessel disease, and small chronic microbleeds, probably hypertensive related.   Electronically Signed   By: Staci Righter M.D.   On: 11/28/2014 09:27   Mr Lumbar Spine Wo Contrast  11/28/2014   CLINICAL DATA:  Metastatic lung cancer.  RIGHT-sided pain.  EXAM:  MRI LUMBAR SPINE WITHOUT CONTRAST  TECHNIQUE: Multiplanar, multisequence MR imaging of the lumbar spine was performed. No intravenous contrast was administered.  COMPARISON:  CT abdomen pelvis 11/23/2014.  FINDINGS: Large RIGHT paraspinous mass with osseous invasion centered at L5 having cross-sectional measurements as seen on series 7, image 38 of 78 x 72 mm. Craniocaudal extent as seen on image 3 series 4 of 112 mm. Significant RIGHT-sided tumor at L5 results in mild endplate softening and early compression. No retropulsion. No similar loss of height at L4 or S1. Minor tumor involvement in S2.  Tumor within the extraforaminal compartment on the RIGHT at L4-5 and L5-S1 likely affecting the  RIGHT-sided nerve roots at that location. Tumor also abuts the first sacral nerve root as it exits from the ventral sacrum. No frank epidural tumor.  Vascular encasement of the RIGHT common iliac artery. Vascular displacement of the RIGHT common iliac vein. RIGHT hydronephrosis has been relieved by percutaneous drainage.  There is mild degenerative anterolisthesis L4 on L5 related to facet disease of approximately 2 mm. No significant disc protrusion. Mild stenosis at L4-5 related to slip, facet disease, and annular bulging.  IMPRESSION: Metastatic squamous cell carcinoma with a large RIGHT paraspinous mass and osseous invasion; estimated measurements 78 x 72 x 112 mm.  Within limits of evaluation on noncontrast scan, no definite epidural tumor.  Tumor within the extraforaminal compartment on the RIGHT at L4-5 and L5-S1 as well as along the RIGHT side of the sacrum likely affects the RIGHT L4, L5, and S1 nerve roots.  Mild pathologic endplate compression at L5 on the RIGHT. No retropulsion.   Electronically Signed   By: Staci Righter M.D.   On: 11/28/2014 10:14   US Venous Img Lower Unilateral Right  11/08/2014   CLINICAL DATA:  Right lower extremity pain and edema. History of smoking. History of lung cancer. Evaluate for DVT.  EXAM: RIGHT LOWER EXTREMITY VENOUS DOPPLER ULTRASOUND  TECHNIQUE: Gray-scale sonography with graded compression, as well as color Doppler and duplex ultrasound were performed to evaluate the lower extremity deep venous systems from the level of the common femoral vein and including the common femoral, femoral, profunda femoral, popliteal and calf veins including the posterior tibial, peroneal and gastrocnemius veins when visible. The superficial great saphenous vein was also interrogated. Spectral Doppler was utilized to evaluate flow at rest and with distal augmentation maneuvers in the common femoral, femoral and popliteal veins.  COMPARISON:  None.  FINDINGS: Contralateral Common Femoral  Vein: Respiratory phasicity is normal and symmetric with the symptomatic side. No evidence of thrombus. Normal compressibility.  Common Femoral Vein: No evidence of thrombus. Normal compressibility, respiratory phasicity and response to augmentation.  Saphenofemoral Junction: No evidence of thrombus. Normal compressibility and flow on color Doppler imaging.  Profunda Femoral Vein: No evidence of thrombus. Normal compressibility and flow on color Doppler imaging.  Femoral Vein: No evidence of thrombus. Normal compressibility, respiratory phasicity and response to augmentation.  Popliteal Vein: No evidence of thrombus. Normal compressibility, respiratory phasicity and response to augmentation.  Calf Veins: No evidence of thrombus. Normal compressibility and flow on color Doppler imaging.  Superficial Great Saphenous Vein: No evidence of thrombus. Normal compressibility and flow on color Doppler imaging.  Venous Reflux:  None.  Other Findings: Subcutaneous edema is noted at the level of the calf and ankle (representative images 38, 39 and 40).  IMPRESSION: No evidence of DVT within the right lower extremity.   Electronically Signed   By: Sandi Mariscal  M.D.   On: 11/08/2014 15:41   Ct Biopsy  11/23/2014   INDICATION: History of lung cancer, now with infiltrative right-sided paraspinal mass adjacent to the caudal aspect of the lumbar spine extending to involve the lumbar sacral junction. Please perform CT-guided biopsy for tissue diagnostic purposes  EXAM: CT-GUIDED BIOPSY OF INFILTRATIVE RIGHT PARASPINAL MASS.  COMPARISON:  Lumbar spine CT - 11/08/2014  MEDICATIONS: Fentanyl 25 mcg IV; Versed 1 mg IV  ANESTHESIA/SEDATION: Sedation time  8 minutes  CONTRAST:  None  COMPLICATIONS: None immediate  PROCEDURE: Informed consent was obtained from the patient following an explanation of the procedure, risks, benefits and alternatives. A time out was performed prior to the initiation of the procedure.  The patient was  positioned prone on the CT table and a limited CT was performed for procedural planning demonstrating unchanged appearance of the note expansile paraspinal mass adjacent to the caudal aspect of the lumbar spine measuring at least 5.6 x 7.3 cm in diameter (image 16, series 2). The procedure was planned. The operative site was prepped and draped in the usual sterile fashion. Appropriate trajectory was confirmed with a 22 gauge spinal needle after the adjacent tissues were anesthetized with 1% Lidocaine with epinephrine.  Under intermittent CT guidance, a 17 gauge coaxial needle was advanced into the peripheral aspect of the mass. Appropriate positioning was confirmed and 6 core needle biopsy samples were obtained with an 18 gauge core needle biopsy device. The co-axial needle was removed and hemostasis was achieved with manual compression.  A limited postprocedural CT was negative for hemorrhage or additional complication. A dressing was placed. The patient tolerated the procedure well without immediate postprocedural complication.  IMPRESSION: Technically successful CT guided core needle biopsy of expansile right-sided paraspinal mass adjacent to the caudal aspect of the lumbar spine.   Electronically Signed   By: Sandi Mariscal M.D.   On: 11/23/2014 10:13   Dg Chest Port 1 View  11/24/2014   CLINICAL DATA:  Fever.  Possible sepsis.  Initial encounter.  EXAM: PORTABLE CHEST - 1 VIEW  COMPARISON:  Chest radiograph performed 07/29/2014, and CT of the chest performed 06/27/2014  FINDINGS: The lungs are well-aerated. Vascular congestion is noted, with mild bibasilar scarring. There is no evidence of pleural effusion or pneumothorax. Known left-sided pulmonary nodules are partially characterized on radiograph.  The cardiomediastinal silhouette is mildly enlarged. No acute osseous abnormalities are seen.  IMPRESSION: 1. Vascular congestion and mild cardiomegaly, with mild bibasilar scarring. No definite evidence of  pneumonia. 2. Known left-sided pulmonary nodules are partially characterized on radiograph.   Electronically Signed   By: Garald Balding M.D.   On: 11/24/2014 06:59   Ir Nephrostomy Placement Right  11/24/2014   CLINICAL DATA:  81 year old with a right paraspinal mass, mild right hydronephrosis and urinary infection. Request for percutaneous nephrostomy tube.  EXAM: RIGHT PERCUTANEOUS NEPHROSTOMY TUBE PLACEMENT WITH ULTRASOUND AND FLUOROSCOPIC GUIDANCE  Physician: Stephan Minister. Henn, MD  FLUOROSCOPY TIME:  5 minutes and 6 seconds.  37  mGy  MEDICATIONS: 0.5 mg Versed, 25 mcg fentanyl. A radiology nurse monitored the patient for moderate sedation.  Patient is already receiving antibiotics and additional antibiotics were not given for this procedure.  ANESTHESIA/SEDATION: Moderate sedation time: 26 minutes  PROCEDURE: Informed consent was obtained for a percutaneous nephrostomy tube. Patient was placed prone. The right kidney was identified with ultrasound. The right flank was prepped and draped in sterile fashion. Maximal barrier sterile technique was utilized including caps, mask, sterile gowns,  sterile gloves, sterile drape, hand hygiene and skin antiseptic. Skin was anesthetized with 1% lidocaine. A 22 gauge needle was directed into the right mid/lower pole collecting system with ultrasound guidance. Contrast injection confirmed placement in the renal collecting system. A 0.018 wire was advanced. An Accustick dilator set was placed. Tract was dilated to accommodate a 10.2 [place] multipurpose drain. The catheter was reconstituted in the renal pelvis. Contrast injection confirmed appropriate positioning. Catheter was attached to a gravity bag and sutured in place. Bandage was placed at the catheter site. Fluoroscopic and ultrasound images were taken and saved for documentation.  FINDINGS: Mild dilatation of the right renal collecting system. Access was obtained from the mid/lower pole. Small amount of contrast was  draining distal to the obstructing lesion.  Estimated blood loss: Minimal  COMPLICATIONS: None  IMPRESSION: Successful placement of a right percutaneous nephrostomy tube with ultrasound and fluoroscopic guidance.   Electronically Signed   By: Markus Daft M.D.   On: 11/24/2014 17:11    Oren Binet, MD  [facility] Pager:[phone]  If 7PM-7AM, please contact night-coverage [email or web address] Password [email or web address] 11/28/2014, 10:20 AM   LOS: 4 days

## 2014-11-28 NOTE — Clinical Social Work Note (Signed)
Clinical Social Work Assessment  Patient Details  Name: [name] MRN: [id number] Date of Birth: [birth year]  Date of referral:  11/28/14               Reason for consult:  Discharge Planning                Permission sought to share information with:  Family Supports Permission granted to share information::  Yes, Verbal Permission Granted  Name::     [name]  Agency::     Relationship::  daughter  Contact Information:  [phone]  Housing/Transportation Living arrangements for the past 2 months:  [place] of Information:  Patient, Adult Children Patient Interpreter Needed:  None Criminal Activity/Legal Involvement Pertinent to Current Situation/Hospitalization:  No - Comment as needed Significant Relationships:  Adult Children Lives with:  Adult Children Do you feel safe going back to the place where you live?  No Need for family participation in patient care:  Yes (Comment) (pt daughter at bedside)  Care giving concerns:  Pt admitted from home with pt daughter. Pt requiring assistance and recommended for rehab at SNF prior to return home.    Social Worker assessment / plan:  CSW received referral for New SNF.   CSW met with pt and pt daughter and pt son-in-law. CSW introduced self and explained role. CSW provided support as pt discussed that she has had a busy day today as she had an MRI and participated with occupational therapy. CSW discussed therapy recommendation for short term rehab at [facility] before returning home. Pt and pt daughter agreeable to short term rehab at [facility] CSW inquired with pt daughter regarding understanding about plan for radiation treatment. Pt daughter stated that she is awaiting to speak with radiation oncologist to determine plan for radiation, but anticipates that pt will have radiation treatment. CSW discussed that facilities can be limited with radiation treatment planned. Pt daughter discussed that pt and pt family live in  [place], [place] and preference is for pt to be in [place] area, prefer [facility]. CSW discussed with pt family that pt family may be responsible for pt transportation to radiation or have to pay out of pocket for transportation to radiation if pt is in [place] area. Pt daughter states that pt has a lot of support and pt family can provide transportation to radiation in [place], [place] if pt can be placed in [place] area. CSW discussed that pt options may be limited also due to pt insurance [facility] and pt and pt daughter expressed understanding. Pt and pt daughter agreeable to [facility] and [facility] search.   CSW completed FL2 and initiated SNF search to [facility] and [facility] and included information regarding anticipation for radiation treatment. CSW spoke with [facility] who is reviewing pt information and had questions regarding if pt will be discharged on IV antibiotics and [facility] stated that the IV antibiotic pt is currently on is not something that the facilities pharmacy is able to obtain. CSW paged MD regarding this concern and awaiting response.   CSW to follow up with pt and pt daughter regarding SNF bed offers.   CSW contacted pt insurance Brenda and sent face sheet to insurance company to initiate insurance authorization request.   CSW to continue to follow to provide support and assist with pt disposition needs when pt medical ready for discharge.   Employment status:  Retired Forensic scientist:  Managed Medicare PT Recommendations:  [place] / Referral to community resources:  [place]  Patient/Family's Response to care:  Pt alert and oriented x 4. Pt daughter supportive and actively involved in pt care and reports strong family supports for pt. Pt daughter prefers for pt to be placed closer to home and pt family states that pt family could  provide transportation to radiation in [place].   Patient/Family's Understanding of and Emotional Response to Diagnosis, Current Treatment, and Prognosis:  Pt and pt daughter display a good understanding about pt diagnosis and treatment plan. Pt had some questions surrounding radiation treatment and CSW clarified pt questions. Pt and pt daughter coping appropriately with diagnosis and treatment plans and hopeful that radiation will assist pt in being able to participate more with therapy.   Emotional Assessment Appearance:  Appears older than stated age Attitude/Demeanor/Rapport:  Other (pt appropriate ) Affect (typically observed):  Adaptable, Pleasant Orientation:  Oriented to Self, Oriented to Place, Oriented to  Time, Oriented to Situation Alcohol / Substance use:  Not Applicable Psych involvement (Current and /or in the community):  No (Comment)  Discharge Needs  Concerns to be addressed:  Discharge Planning Concerns Readmission within the last 30 days:  No Current discharge risk:  Physical Impairment Barriers to Discharge:  Continued Medical Work up   [name] Pier, LCSW 11/28/2014, 4:00 PM  [phone]

## 2014-11-28 NOTE — Progress Notes (Signed)
ANTICOAGULATION CONSULT NOTE - Follow-Up  Pharmacy Consult for Lovenox Indication:  VTE treatment   Allergies  Allergen Reactions  . Amlodipine Swelling  . Ciprofloxacin Hives and Other (See Comments)    REACTION: weakness  . Statins Other (See Comments)  . Aspirin Swelling    Mouth swelling, tongue  . Cephalexin Hives  . Hydralazine Nausea And Vomiting  . Iron Nausea And Vomiting  . Sulfonamide Derivatives Hives  . Penicillins Other (See Comments)    Reaction is unknown    Patient Measurements: Height: 5' 8.5" (174 cm) Weight: 154 lb 5.2 oz (70 kg) IBW/kg (Calculated) : 65.05  Vital Signs: Temp: 98.3 F (36.8 C) (06/27 2036) Temp Source: Oral (06/27 2036) BP: 141/76 mmHg (06/27 2036) Pulse Rate: 76 (06/27 2036)  Labs:  Recent Labs  11/26/14 0137 11/26/14 1006 11/27/14 0426 11/28/14 0425  HGB 8.1*  --  8.6*  --   HCT 25.3*  --  27.0*  --   PLT 247  --  272  --   LABPROT 15.9*  --   --   --   INR 1.25  --   --   --   HEPARINUNFRC 0.33 0.58  --   --   CREATININE 2.94*  --  2.80* 2.56*    Estimated Creatinine Clearance: 18.3 mL/min (by C-G formula based on Cr of 2.56).   Medical History: Past Medical History  Diagnosis Date  . Hypertension   . Hypercholesterolemia   . CKD (chronic kidney disease)   . Coronary artery disease     a.  LHC (06/04/05): LHC done in [place] with high grade RCA => s/p BMS to RCA;  b.  Nuclear (09/14/09): Lexiscan; Inf infarct with mild peri-infarct ishemia, EF 52%; Low Risk.  Marland Kitchen GERD (gastroesophageal reflux disease)   . Chronic anemia   . Ischemic cardiomyopathy     a. Echo (07/26/13): Mild LVH, EF 35-40%, diff HK, inf AK, Gr 2 DD, Tr AI, mildly dilated Ao root, MAC, mild MR, mild LAE, mod reduced RVSF.  . Non-small cell carcinoma of lung     Stage IV   Assessment: 41 yoF with stage 4 metastatic lung cancer and R paraspinal mass (s/p biopsy on 6/22). She also has a new right hydronephrosis -- s/p R perc nephrostomy  placement on 6/23. She c/o RLE edema on admission and Doppler doppler showed new BL DVT. Heparin drip started 6 hours after perc nephrostomy drain placement for thrombosis. Bridge to warfarin therapy started 6/24.  MD prefers for patient to be on Lovenox due to cancer history.  TRH and hematology aware of CKD-IV and wish to switch to Lovenox if not contraindicated.  PharmD had discussion with MD on 6/25 and although Lovenox use is not ideal in severe CKD, use is not contraindicated and think we can safely administer if we monitor anti-Xa levels.  Today, 11/28/2014:  Hgb low, but stable. Platelets WNL.  No bleeding noted.  CKD-IV.  CrCl~18 ml/min  Anti-Xa level at 2042 = 0.38 units/ml (drawn ~ 4.5 hours after lovenox dose given today)  Goal of Therapy:  4-hour Anti-Xa level 0.6-1 unit/ml Monitor platelets by anticoagulation protocol: Yes   Plan:  1.  Increase Lovenox to 80 mg SQ q24h, as peak level is below goal. 2.  Check anti-Xa level after next dose on 6/28 and make adjustments to dose as necessary. 3.  F/u CBC q72h. 4.  Monitor for s/s of bleeding.   Lindell Spar, PharmD, BCPS Pager: [phone] 11/28/2014 9:42  PM

## 2014-11-28 NOTE — Progress Notes (Signed)
Occupational Therapy Treatment Patient Details Name: [name] MRN: [id number] DOB: 1933/08/14 Today's Date: 11/28/2014    History of present illness [name] is a 81 y.o. female with history of stage IV lung cancer who was recently diagnosed with a right paraspinal mass and had undergone biopsy 11/23/14 following which patient was discharged home and patient was experiencing fever chills. Since the fever chills persisted patient came back to the ER. Patient also is complaining of some right sided flank pain. CT abdomen and pelvis shows mild hydronephrosis with tumor encasing the ureter. UA was showing features consistent with UTI. Patient has been admitted for further management of UTI with possible developing sepsis. Patient has been having increasing right lower extremity edema. positive for  bilateral DVT's      Follow Up Recommendations  SNF    Equipment Recommendations  None recommended by OT    Recommendations for Other Services      Precautions / Restrictions Precautions Precautions: Fall Precaution Comments: RLE weakness       Mobility Bed Mobility Overal bed mobility: Needs Assistance Bed Mobility: Supine to Sit     Supine to sit: Mod assist        Transfers                 General transfer comment: did not perform        ADL       Grooming: Set up;Sitting                                 General ADL Comments: added goal for BUE exercise as pt agreeable to this this day                Cognition   Behavior During Therapy: [facility] for tasks assessed/performed Overall Cognitive Status: Within Functional Limits for tasks assessed                         Exercises General Exercises - Upper Extremity Shoulder Flexion: AROM;Seated;10 reps;Both;Other (comment) (3 sets) Elbow Flexion: AROM;Both;20 reps Elbow Extension: AROM;Both;20 reps   Shoulder Instructions       General Comments      Pertinent  Vitals/ Pain       Pain Score: 4  Pain Location:  r leg Pain Descriptors / Indicators: Discomfort Pain Intervention(s): Limited activity within patient's tolerance;Repositioned  Home Living                                          Prior Functioning/Environment              Frequency Min 2X/week     Progress Toward Goals  OT Goals(current goals can now be found in the care plan section)     ADL Goals Pt/caregiver will Perform Home Exercise Program: Increased strength;Both right and left upper extremity;With theraband;With written HEP provided  Plan      Co-evaluation                 End of Session     Activity Tolerance Patient tolerated treatment well   Patient Left  in bed with call bell within reach   Nurse Communication Mobility status        Time: 1257-1310 OT Time Calculation (min): 13 min  Charges: OT  General Charges $OT Visit: 1 Procedure OT Treatments $Therapeutic Exercise: 8-22 mins  Psalm Arman, Thereasa Parkin 11/28/2014, 1:39 PM

## 2014-11-28 NOTE — Progress Notes (Signed)
PT Cancellation Note  Patient Details Name: [name] MRN: [id number] DOB: 1934/02/03   Cancelled Treatment:    Reason Eval/Treat Not Completed: Medical issues which prohibited therapy (patient reports that she is exhausted from testing.)will return 11/29/14   Claretha Cooper 11/28/2014, 4:17 PM Tresa Endo PT [phone]

## 2014-11-28 NOTE — Consult Note (Signed)
[facility] NEW PATIENT EVALUATION  Name: [name] MRN: [id number]  Date:   11/23/2014           DOB: 1933-09-06  Status: inpatient   CC: [name] Cahill, MD     REFERRING PHYSICIAN: Dr. Oren Binet  DIAGNOSIS:  Metastatic squamous cell carcinoma of the lung to the lumbar spine/right paraspinal region  HISTORY OF PRESENT ILLNESS:  [name] is a 81 y.o. female who is seen today through the courtesy of Dr. Sloan [name] for evaluation and consideration of palliative radiotherapy in the management of her metastatic squamous cell carcinoma of the lung to the lumbar spine/right paraspinal region.  She was diagnosed with stage IV squamous cell lung cancer in March 2007.  She received 6 cycles of systemic therapy with carboplatin and docetaxel with the last dose given in August 2007.  She is been on observation since then.  The patient tells me that she first started having right foot discomfort approximately 5-6 years ago and was told that she had gout.  Approximately 1 month ago she noted worsening right lower extremity swelling and weakness of her right foot to the point that she was dragging her right foot secondary to a foot drop.  More recently, she developed with a urinary tract infection with a CT scan of the abdomen/pelvis showing mild hydronephrosis and also a right lower paraspinal tumor which was biopsied on 11/23/2014 and found to be diagnostic for squamous cell carcinoma.  On CT the mass measured 12.0 x 8.1 x 6.0 cm.  There was erosion into the vertebral bodies at L4, L5, and S1 obscuring the exiting nerve roots on the right at these levels.  There was also diffuse encasement of the right internal iliac artery.  There is no evidence for metastatic disease to her liver.  Her chest x-ray appears to be stable.  A MRI scan today shows again a right paraspinous mass with osseous invasion measuring 7.8 x 7.2 x 11.2 cm.  Tumor was seen within the extra  foraminal compartment on the right at L4-5 and L5-S1 as well as along the right side of the sacrum likely affecting the right L4, L5, and S1 nerve roots.  Interestingly, this mass was not seen on a CT scan of the abdomen/pelvis in January 2015.  She underwent right nephrostomy tube placement by interventional radiology.  She states that she does have pain that radiates to her right lower extremity and this has been present for almost one month.  No difficulty with bladder or bowel control.  PREVIOUS RADIATION THERAPY: No   PAST MEDICAL HISTORY:  has a past medical history of Hypertension; Hypercholesterolemia; CKD (chronic kidney disease); Coronary artery disease; GERD (gastroesophageal reflux disease); Chronic anemia; Ischemic cardiomyopathy; and Non-small cell carcinoma of lung.     PAST SURGICAL HISTORY:  Past Surgical History  Procedure Laterality Date  . Hematoma evacuation  December 2006    groin  . Appendectomy    . Cataract extraction    . Hemorroidectomy    . Laminectomy       FAMILY HISTORY: family history includes Heart failure in her mother; Lung cancer in her brother and sister; Stomach cancer in her brother. There is no history of Heart attack or Stroke.  Her father died from a ruptured gallbladder and 13, and her mother died from kidney failure at 70.   SOCIAL HISTORY:  reports that she has quit smoking. Her smoking use included Cigarettes. She does not  have any smokeless tobacco history on file. She reports that she does not drink alcohol.  Widowed for the past 23 years, 2 daughters.  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   ALLERGIES: Amlodipine; Ciprofloxacin; Statins; Aspirin; Cephalexin; Hydralazine; Iron; Sulfonamide derivatives; and Penicillins   MEDICATIONS:  Current Facility-Administered Medications  Medication Dose Route Frequency Provider Last Rate Last Dose  . acetaminophen (TYLENOL) tablet 650 mg  650 mg Oral Q6H PRN Rise Patience, MD   650 mg  at 11/28/14 1826   Or  . acetaminophen (TYLENOL) suppository 650 mg  650 mg Rectal Q6H PRN Rise Patience, MD      . cloNIDine (CATAPRES) tablet 0.1 mg  0.1 mg Oral TID Jonetta Osgood, MD   0.1 mg at 11/28/14 1827  . enoxaparin (LOVENOX) injection 70 mg  1 mg/kg Subcutaneous Q24H Donald Prose Runyon, RPH   70 mg at 11/28/14 1613  . enoxaparin (LOVENOX) patient education kit   Does not apply Once Jonetta Osgood, MD      . imipenem-cilastatin (PRIMAXIN) 250 mg in sodium chloride 0.9 % 100 mL IVPB  250 mg Intravenous Q12H Alphonzo Severance, RPH   250 mg at 11/28/14 1826  . LORazepam (ATIVAN) tablet 0.5 mg  0.5 mg Oral Q6H PRN Domenic Polite, MD   0.5 mg at 11/28/14 1826  . metoprolol succinate (TOPROL-XL) 24 hr tablet 25 mg  25 mg Oral Daily Jonetta Osgood, MD   25 mg at 11/28/14 1014  . morphine 2 MG/ML injection 1 mg  1 mg Intravenous Q4H PRN Rise Patience, MD   1 mg at 11/27/14 1508  . ondansetron (ZOFRAN) tablet 4 mg  4 mg Oral Q6H PRN Rise Patience, MD       Or  . ondansetron [facility]) injection 4 mg  4 mg Intravenous Q6H PRN Rise Patience, MD   4 mg at 11/25/14 2007  . pantoprazole (PROTONIX) EC tablet 40 mg  40 mg Oral Daily Rise Patience, MD   40 mg at 11/28/14 1014  . polyethylene glycol (MIRALAX / GLYCOLAX) packet 17 g  17 g Oral Daily Jonetta Osgood, MD   17 g at 11/27/14 1019  . senna-docusate (Senokot-S) tablet 1 tablet  1 tablet Oral BID Jonetta Osgood, MD   1 tablet at 11/28/14 1014  . sorbitol 70 % solution 30 mL  30 mL Oral Daily PRN Jonetta Osgood, MD   30 mL at 11/27/14 1013     REVIEW OF SYSTEMS:  Pertinent items are noted in HPI.    PHYSICAL EXAM:  height is 5' 8.5" (1.74 m) and weight is 154 lb 5.2 oz (70 kg). Her oral temperature is 98.3 F (36.8 C). Her blood pressure is 151/60 and her pulse is 70. Her respiration is 18 and oxygen saturation is 99%.   Alert and oriented.  Head and neck examination: Grossly unremarkable.  Nodes:  There is no palpable cervical or supraclavicular lymphadenopathy.  Chest: Lungs clear.  Abdomen: Without masses organomegaly.  Back: There is pain described along the right lower lumbar spine.  Extremities: There is 2+ distal right lower extremity edema.  Neurologic examination remarkable for a right foot drop with diminished sensation along the right L5 and S1 dermatomes.   LABORATORY DATA:  Lab Results  Component Value Date   WBC 9.3 11/27/2014   HGB 8.6* 11/27/2014   HCT 27.0* 11/27/2014   MCV 81.8 11/27/2014   PLT 272  11/27/2014   Lab Results  Component Value Date   NA 134* 11/28/2014   K 4.9 11/28/2014   CL 102 11/28/2014   CO2 22 11/28/2014   Lab Results  Component Value Date   ALT 10* 11/24/2014   AST 21 11/24/2014   ALKPHOS 103 11/24/2014   BILITOT 0.7 11/24/2014   CMP     Component Value Date/Time   NA 134* 11/28/2014 0425   NA 137 11/15/2014 1445   NA 143 12/23/2011 0830   K 4.9 11/28/2014 0425   K 3.9 11/15/2014 1445   K 4.7 12/23/2011 0830   CL 102 11/28/2014 0425   CL 104 06/19/2012 0958   CL 101 12/23/2011 0830   CO2 22 11/28/2014 0425   CO2 24 11/15/2014 1445   CO2 27 12/23/2011 0830   GLUCOSE 87 11/28/2014 0425   GLUCOSE 125 11/15/2014 1445   GLUCOSE 95 06/19/2012 0958   GLUCOSE 108 12/23/2011 0830   BUN 55* 11/28/2014 0425   BUN 54.2* 11/15/2014 1445   BUN 25* 12/23/2011 0830   CREATININE 2.56* 11/28/2014 0425   CREATININE 2.8* 11/15/2014 1445   CREATININE 2.09* 02/01/2014 1545   CALCIUM 8.6* 11/28/2014 0425   CALCIUM 9.5 11/15/2014 1445   CALCIUM 9.0 12/23/2011 0830   PROT 6.4* 11/24/2014 0555   PROT 7.5 11/15/2014 1445   PROT 7.1 12/23/2011 0830   ALBUMIN 2.5* 11/24/2014 0555   ALBUMIN 2.9* 11/15/2014 1445   AST 21 11/24/2014 0555   AST 21 11/15/2014 1445   AST 16 12/23/2011 0830   ALT 10* 11/24/2014 0555   ALT 12 11/15/2014 1445   ALT 16 12/23/2011 0830   ALKPHOS 103 11/24/2014 0555   ALKPHOS 137 11/15/2014 1445   ALKPHOS 76  12/23/2011 0830   BILITOT 0.7 11/24/2014 0555   BILITOT 0.68 11/15/2014 1445   BILITOT 0.60 12/23/2011 0830   GFRNONAA 17* 11/28/2014 0425   GFRAA 19* 11/28/2014 0425       IMPRESSION: Metastatic squamous cell carcinoma of the lung to the right lower paraspinous region/spine.  Her clinical course is obviously quite unusual for non-small cell lung cancer.  I'm not sure if we can rule out a squamous cell GYN primary.  In any case, it would not change my recommendation for palliative radiation therapy to her right paraspinous/spine mass.  I recommend 14 sessions of fractionated radiation therapy (35 Gy) which should be reasonably well tolerated.  She is tentatively scheduled for CT simulation tomorrow morning at 9:30.  I can speak with her family at that time.  I hope we can begin her radiation therapy by the end of the week.  This can be done as an outpatient.  From a technical standpoint, she will be treated with 3-dimensional treatment which will take 1-2 days to plan.   PLAN: As discussed above.  Consent for treatment will be signed tomorrow morning.  I spent 40 minutes face to face with the patient and more than 50% of that time was spent in counseling and/or coordination of care.

## 2014-11-29 ENCOUNTER — Inpatient Hospital Stay
Admission: RE | Admit: 2014-11-29 | Discharge: 2014-12-19 | Disposition: A | Discharge status: Home / Self Care | Payer: Medicare Other | Source: Ambulatory Visit | Attending: Internal Medicine | Admitting: Internal Medicine

## 2014-11-29 ENCOUNTER — Ambulatory Visit: Payer: Medicare Other | Admitting: Internal Medicine

## 2014-11-29 ENCOUNTER — Other Ambulatory Visit: Payer: Medicare Other

## 2014-11-29 ENCOUNTER — Ambulatory Visit
Admission: RE | Admit: 2014-11-29 | Discharge: 2014-11-29 | Disposition: A | Discharge status: Home / Self Care | Payer: Medicare Other | Source: Ambulatory Visit | Attending: Radiation Oncology | Admitting: Radiation Oncology

## 2014-11-29 DIAGNOSIS — Z510 Encounter for antineoplastic radiation therapy: Secondary | ICD-10-CM | POA: Insufficient documentation

## 2014-11-29 DIAGNOSIS — C3490 Malignant neoplasm of unspecified part of unspecified bronchus or lung: Secondary | ICD-10-CM | POA: Insufficient documentation

## 2014-11-29 DIAGNOSIS — C7951 Secondary malignant neoplasm of bone: Secondary | ICD-10-CM

## 2014-11-29 LAB — BASIC METABOLIC PANEL
Anion gap: 10 (ref 5–15)
BUN: 52 mg/dL — ABNORMAL HIGH (ref 6–20)
CO2: 22 mmol/L (ref 22–32)
Calcium: 8.4 mg/dL — ABNORMAL LOW (ref 8.9–10.3)
Chloride: 102 mmol/L (ref 101–111)
Creatinine, Ser: 2.57 mg/dL — ABNORMAL HIGH (ref 0.44–1.00)
GFR calc non Af Amer: 17 mL/min — ABNORMAL LOW (ref >60)
GFR, EST AFRICAN AMERICAN: 19 mL/min — AB (ref >60)
Glucose, Bld: 93 mg/dL (ref 65–99)
POTASSIUM: 4.4 mmol/L (ref 3.5–5.1)
SODIUM: 134 mmol/L — AB (ref 135–145)

## 2014-11-29 MED ORDER — HEPARIN SOD (PORK) LOCK FLUSH 100 UNIT/ML IV SOLN
500.0000 [IU] | Freq: Once | INTRAVENOUS | Status: AC
  Administered 2014-11-29: 500 [IU] via INTRAVENOUS
  Filled 2014-11-29: qty 5

## 2014-11-29 MED ORDER — METOPROLOL SUCCINATE ER 25 MG PO TB24
25.0000 mg | ORAL_TABLET | Freq: Every day | ORAL | Status: DC
Start: 2014-11-29 — End: 2015-04-03

## 2014-11-29 MED ORDER — SODIUM CHLORIDE 0.9 % IV SOLN
500.0000 mg | INTRAVENOUS | Status: DC
  Filled 2014-11-29: qty 0.5

## 2014-11-29 MED ORDER — CLONIDINE HCL 0.2 MG PO TABS: 0.2000 mg | ORAL_TABLET | Freq: Three times a day (TID) | ORAL | Status: DC

## 2014-11-29 MED ORDER — ENOXAPARIN SODIUM 80 MG/0.8ML ~~LOC~~ SOLN: 80.0000 mg | SUBCUTANEOUS | Status: DC

## 2014-11-29 MED ORDER — LORAZEPAM 1 MG PO TABS: 1.0000 mg | ORAL_TABLET | Freq: Three times a day (TID) | ORAL | Status: DC

## 2014-11-29 MED ORDER — SODIUM CHLORIDE 0.9 % IJ SOLN
10.0000 mL | Freq: Two times a day (BID) | INTRAMUSCULAR | Status: DC
  Administered 2014-11-29: 30 mL

## 2014-11-29 MED ORDER — HYDROCODONE-ACETAMINOPHEN 5-325 MG PO TABS: 1.0000 | ORAL_TABLET | Freq: Four times a day (QID) | ORAL | Status: DC | PRN

## 2014-11-29 MED ORDER — POLYETHYLENE GLYCOL 3350 17 G PO PACK: 17.0000 g | PACK | Freq: Every day | ORAL | Status: DC

## 2014-11-29 MED ORDER — HYDROCODONE-ACETAMINOPHEN 10-325 MG PO TABS
1.0000 | ORAL_TABLET | Freq: Four times a day (QID) | ORAL | Status: DC | PRN
  Administered 2014-11-29: 2 via ORAL
  Filled 2014-11-29: qty 2

## 2014-11-29 MED ORDER — SODIUM CHLORIDE 0.9 % IV SOLN: 500.0000 mg | INTRAVENOUS | Status: DC

## 2014-11-29 MED ORDER — SODIUM CHLORIDE 0.9 % IJ SOLN: 10.0000 mL | INTRAMUSCULAR | Status: DC | PRN

## 2014-11-29 NOTE — Progress Notes (Signed)
Complex simulation/treatment planning note: The patient was taken to the CT simulator.  She was placed supine.  A Vac lock immobilization device was constructed.  She was then scanned.  I chose an isocenter along the right lumbar paraspinal region.  The CT data set was sent to the planning system where I contoured her CTV 35.  This be expanded by 1.0 cm to create PTV 35 which will receive 3500 cGy in 14 sessions.  I plan to treat the patient with 3-D planning with [place], 6 MV photons.  If this is not satisfactory then we will change her to Tomotherapy 3-D.

## 2014-11-29 NOTE — Discharge Summary (Addendum)
PATIENT DETAILS Name: [name] Age: 81 y.o. Sex: female Date of Birth: May 12, 1934 MRN: [id number]. Admitting Physician: Rise Patience, MD ZDG:LOVF, [facility], MD  Admit Date: 11/23/2014 Discharge date: 11/29/2014  Recommendations for Outpatient Follow-up:  1. Please check factor X A level in the next 3-4 days and adjust dosing of Lovenox accordingly. Lovenox dose increased to 80 mg daily from 6/28 (prior dose was 70 mg daily)  2. Please ensure patient follows with radiation oncology-next appointment on 7/5 see below  3. Please ensure patient follows with urology ([facility])-patient has a nephrostomy tube in place. Per urology this would be kept in place for the next few weeks before it is internalized.  4. Please continue to monitor CBC and chemistries periodically.  PRIMARY DISCHARGE DIAGNOSIS:  Principal Problem:   Fever Active Problems:   CAD, NATIVE VESSEL   Hydronephrosis of right kidney   UTI (lower urinary tract infection)   Renal failure (ARF), acute on chronic   SIRS (systemic inflammatory response syndrome)   Pressure ulcer   Hydronephrosis   Non-small cell lung cancer   Paraspinal mass   Sepsis   Metastatic squamous cell carcinoma to bone      PAST MEDICAL HISTORY: Past Medical History  Diagnosis Date  . Hypertension   . Hypercholesterolemia   . CKD (chronic kidney disease)   . Coronary artery disease     a.  LHC (06/04/05): LHC done in [place] with high grade RCA => s/p BMS to RCA;  b.  Nuclear (09/14/09): Lexiscan; Inf infarct with mild peri-infarct ishemia, EF 52%; Low Risk.  Marland Kitchen GERD (gastroesophageal reflux disease)   . Chronic anemia   . Ischemic cardiomyopathy     a. Echo (07/26/13): Mild LVH, EF 35-40%, diff HK, inf AK, Gr 2 DD, Tr AI, mildly dilated Ao root, MAC, mild MR, mild LAE, mod reduced RVSF.  . Non-small cell carcinoma of lung     Stage IV    DISCHARGE MEDICATIONS: Current Discharge Medication List    START taking  these medications   Details  enoxaparin (LOVENOX) 80 MG/0.8ML injection Inject 0.8 mLs (80 mg total) into the skin daily.    ertapenem 0.5 g in sodium chloride 0.9 % 50 mL Inject 0.5 g into the vein daily. 8 more days from 11/29/14-and then discontinue. Please remove PICC line when antibiotics are discontinued.    polyethylene glycol (MIRALAX / GLYCOLAX) packet Take 17 g by mouth daily.      CONTINUE these medications which have CHANGED   Details  cloNIDine (CATAPRES) 0.2 MG tablet Take 1 tablet (0.2 mg total) by mouth 3 (three) times daily.   Associated Diagnoses: Essential hypertension, benign; Essential hypertension, malignant    HYDROcodone-acetaminophen (NORCO/VICODIN) 5-325 MG per tablet Take 1 tablet by mouth every 6 (six) hours as needed for moderate pain. Qty: 30 tablet, Refills: 0    LORazepam (ATIVAN) 1 MG tablet Take 1 tablet (1 mg total) by mouth every 8 (eight) hours. Qty: 10 tablet, Refills: 0    metoprolol succinate (TOPROL-XL) 25 MG 24 hr tablet Take 1 tablet (25 mg total) by mouth daily.      CONTINUE these medications which have NOT CHANGED   Details  furosemide (LASIX) 40 MG tablet Take 1 tablet (40 mg total) by mouth daily. Qty: 90 tablet, Refills: 3   Associated Diagnoses: Essential hypertension, benign; Chronic kidney disease, stage III (moderate)    loperamide (IMODIUM A-D) 2 MG tablet Take 2 mg by mouth 4 (four)  times daily as needed for diarrhea or loose stools (diarrhea).    pantoprazole (PROTONIX) 40 MG tablet TAKE ONE TABLET BY MOUTH ONCE DAILY. Qty: 90 tablet, Refills: 1      STOP taking these medications     clopidogrel (PLAVIX) 75 MG tablet         ALLERGIES:   Allergies  Allergen Reactions  . Amlodipine Swelling  . Ciprofloxacin Hives and Other (See Comments)    REACTION: weakness  . Statins Other (See Comments)  . Aspirin Swelling    Mouth swelling, tongue  . Cephalexin Hives  . Hydralazine Nausea And Vomiting  . Iron Nausea And  Vomiting  . Sulfonamide Derivatives Hives  . Penicillins Other (See Comments)    Reaction is unknown    BRIEF HPI:  See H&P, Labs, Consult and Test reports for all details in brief, patient is a 81 y.o. female with history of stage IV lung cancer who was recently diagnosed with a right paraspinal mass and had undergone biopsy a day prior to admission who presented with fever and chills. CT of the abdomen showed hydronephrosis with tumor encasing the ureter. UA was consistent with UTI.  CONSULTATIONS:   hematology/oncology, urology and Radiation oncology  PERTINENT RADIOLOGIC STUDIES: Ct Abdomen Pelvis Wo Contrast  11/24/2014   CLINICAL DATA:  Acute onset of fever and weakness. Right lower quadrant tenderness. Recent CT-guided biopsy of right paraspinal mass. Initial encounter.  EXAM: CT ABDOMEN AND PELVIS WITHOUT CONTRAST  TECHNIQUE: Multidetector CT imaging of the abdomen and pelvis was performed following the standard protocol without IV contrast.  COMPARISON:  CT of the abdomen and pelvis performed 06/23/2013, and lumbar spine CT performed 11/08/2014  FINDINGS: Mild bibasilar atelectasis is noted. This is somewhat nodular at the left lung base, measuring 1.0 cm. Diffuse coronary artery calcifications are seen.  The liver and spleen are unremarkable in appearance. The patient is status post cholecystectomy, with clips noted at the gallbladder fossa. The pancreas and adrenal glands are unremarkable.  There is mild right-sided hydronephrosis, with prominence of the proximal to mid right ureter. This appears to reflect partial encasement of the mid right ureter by the patient's large right paraspinal mass.  Nonspecific perinephric stranding is noted on the right. A few scattered hypodensities and hyperdensities within the kidneys likely reflect cysts. No renal or ureteral stones are identified. There is mild bilateral renal scarring.  No free fluid is identified. The small bowel is unremarkable in  appearance. The stomach is within normal limits. No acute vascular abnormalities are seen. Diffuse calcification is seen along the abdominal aorta and its branches.  The patient is status post appendectomy. Scattered diverticulosis is noted at the splenic flexure of the colon, and along the descending and sigmoid colon, without evidence of diverticulitis.  The bladder is mildly distended and grossly unremarkable. The patient is status post hysterectomy. No suspicious adnexal masses are seen. No inguinal lymphadenopathy is seen.  There is a large 12.0 x 8.1 x 6.0 cm mass arising along the right side of the lower lumbar spine, with diffuse erosion into vertebral bodies at L4, L5 and S1, obscuring the exiting nerve roots on the right at these levels. No definite extension into the spinal canal is yet seen. The mass underlies the right psoas muscle and tracks about the distal IVC and right common iliac vein. There is diffuse encasement of the right internal iliac artery. Mass extends inferiorly medial to the right iliacus muscle.  No additional osseous abnormalities are identified.  IMPRESSION: 1. Large 12.0 x 8.1 x 6.0 cm right paraspinal mass is likely mildly increased in size from recent prior CT, with diffuse erosion into the vertebral bodies at L4, L5 and S1, obscuring the exiting nerve roots on the right at these levels. No definite extension into the spinal canal. Mass underlies the right psoas muscle and tracks about the distal SVC and right common iliac vein. Diffuse encasement of the right internal iliac artery. 2. Mild right-sided hydronephrosis, with prominence of the proximal to mid right ureter. This appears to reflect partial encasement of the mid right ureter by the large right paraspinal mass. 3. Nodule at the left lung base, measuring 1.0 cm, of uncertain significance. This is apparently new from 63. Mild underlying bibasilar atelectasis noted. 4. Few scattered bilateral renal cysts. Mild bilateral  renal scarring noted. 5. Diffuse calcification along the abdominal aorta and its branches. 6. Scattered diverticulosis of the splenic flexure of the colon, and along the descending and sigmoid colon, without evidence of diverticulitis.   Electronically Signed   By: Garald Balding M.D.   On: 11/24/2014 00:31   Ct Lumbar Spine Wo Contrast  11/08/2014   CLINICAL DATA:  Low back pain for 3 weeks. Two falls in 1 day. Severe right hip and right leg pain. Personal history of stage IV non-small cell lung cancer. Prior chemotherapy. No current therapy.  EXAM: CT LUMBAR SPINE WITHOUT CONTRAST  TECHNIQUE: Multidetector CT imaging of the lumbar spine was performed without intravenous contrast administration. Multiplanar CT image reconstructions were also generated.  COMPARISON:  Lumbar spine radiographs 10/25/2014. CT abdomen and pelvis 06/23/2013  FINDINGS: Vertebral alignment is unchanged with grade 1 anterolisthesis of L3 on L4 and L4 on L5 and grade 1 retrolisthesis of L5 on S1. There is slight lumbar levoscoliosis. Disc space narrowing is mild at L4-5 and moderate to severe at L5-S1 and with associated vacuum disc phenomenon.  There is a new, large right-sided lumbosacral paravertebral soft tissue mass which measures approximately 6.9 x 7.1 x 10.5 cm. This invades the lateral aspects of the L5 greater than L4 vertebral bodies. There is involvement of the right L5 pedicle and transverse process, and there is also prominent involvement of the right sacral ala and right S1 vertebral body. The mass displaces the right psoas muscle laterally and encases the right internal iliac artery as well as right common, internal and external iliac veins. It also partially encircles the lower most IVC. There is a pathologic compression fracture of the right half of the L5 vertebral body with mild vertebral body height loss. Tumor extends into the superior aspect of the right L5-S1 neural foramen.  Extensive atherosclerotic vascular  calcification is present in the abdomen and pelvis. The kidneys are atrophic with similar appearance of small hypodense renal lesions most likely representing cysts. Cholecystectomy clips are noted.  L1-2: Mild disc bulging without significant stenosis.  L2-3: Mild disc bulging and mild facet and ligamentum flavum hypertrophy result in mild right lateral recess stenosis and mild right and minimal left neural foraminal stenosis without significant spinal stenosis.  L3-4: Listhesis with disc uncovering, ligamentum flavum hypertrophy, and severe bilateral facet arthrosis result in mild to moderate right and minimal left neural foraminal narrowing without significant spinal stenosis.  L4-5: Listhesis with disc uncovering and moderate facet arthrosis without significant stenosis. The right L4 nerve root is likely involved by tumor lateral to the foramen.  L5-S1: Listhesis with disc uncovering and mild facet arthrosis without significant stenosis. The right L5  nerve root is likely involved by tumor lateral to the foramen.  IMPRESSION: New 11 cm destructive right-sided paravertebral soft tissue mass at the lumbosacral junction, consistent with metastasis. There is osseous involvement at L4, L5, and S1 with pathologic L5 compression fracture. Right-sided iliac arterial and venous encasement as above.  These results were called by telephone at the time of interpretation on 11/08/2014 at 4:01 pm to Dr. Arther Abbott , who verbally acknowledged these results.   Electronically Signed   By: Logan Bores   On: 11/08/2014 16:05   Mr Brain Wo Contrast  11/28/2014   CLINICAL DATA:  Metastatic lung cancer, staging. No reported neurologic symptoms.  EXAM: MRI HEAD WITHOUT CONTRAST  TECHNIQUE: Multiplanar, multiecho pulse sequences of the brain and surrounding structures were obtained without intravenous contrast.  COMPARISON:  CT head 07/28/2014.  FINDINGS: The patient was unable to remain motionless for the exam. Small or subtle  lesions could be overlooked.  The patient was unable to receive gadolinium because of poor renal function. Sensitivity in the detection of metastatic disease is reduced.  No evidence for acute infarction, hemorrhage, mass lesion, hydrocephalus, or extra-axial fluid. Extensive atrophy. Severe chronic microvascular ischemic change. Prominent perivascular spaces. Tiny foci chronic hemorrhage LEFT lateral thalamus and RIGHT lentiform nucleus, likely hypertensive in nature.  Flow voids are maintained. Partial empty sella. No tonsillar herniation. Negative orbits. No sinus disease. Sclerotic mastoids.  IMPRESSION: Noncontrast motion degraded examination, demonstrating no definite intracranial metastatic disease.  Chronic changes as described with atrophy, small vessel disease, and small chronic microbleeds, probably hypertensive related.   Electronically Signed   By: Staci Righter M.D.   On: 11/28/2014 09:27   Mr Lumbar Spine Wo Contrast  11/28/2014   CLINICAL DATA:  Metastatic lung cancer.  RIGHT-sided pain.  EXAM: MRI LUMBAR SPINE WITHOUT CONTRAST  TECHNIQUE: Multiplanar, multisequence MR imaging of the lumbar spine was performed. No intravenous contrast was administered.  COMPARISON:  CT abdomen pelvis 11/23/2014.  FINDINGS: Large RIGHT paraspinous mass with osseous invasion centered at L5 having cross-sectional measurements as seen on series 7, image 38 of 78 x 72 mm. Craniocaudal extent as seen on image 3 series 4 of 112 mm. Significant RIGHT-sided tumor at L5 results in mild endplate softening and early compression. No retropulsion. No similar loss of height at L4 or S1. Minor tumor involvement in S2.  Tumor within the extraforaminal compartment on the RIGHT at L4-5 and L5-S1 likely affecting the RIGHT-sided nerve roots at that location. Tumor also abuts the first sacral nerve root as it exits from the ventral sacrum. No frank epidural tumor.  Vascular encasement of the RIGHT common iliac artery. Vascular  displacement of the RIGHT common iliac vein. RIGHT hydronephrosis has been relieved by percutaneous drainage.  There is mild degenerative anterolisthesis L4 on L5 related to facet disease of approximately 2 mm. No significant disc protrusion. Mild stenosis at L4-5 related to slip, facet disease, and annular bulging.  IMPRESSION: Metastatic squamous cell carcinoma with a large RIGHT paraspinous mass and osseous invasion; estimated measurements 78 x 72 x 112 mm.  Within limits of evaluation on noncontrast scan, no definite epidural tumor.  Tumor within the extraforaminal compartment on the RIGHT at L4-5 and L5-S1 as well as along the RIGHT side of the sacrum likely affects the RIGHT L4, L5, and S1 nerve roots.  Mild pathologic endplate compression at L5 on the RIGHT. No retropulsion.   Electronically Signed   By: Staci Righter M.D.   On: 11/28/2014  10:14   US Venous Img Lower Unilateral Right  11/08/2014   CLINICAL DATA:  Right lower extremity pain and edema. History of smoking. History of lung cancer. Evaluate for DVT.  EXAM: RIGHT LOWER EXTREMITY VENOUS DOPPLER ULTRASOUND  TECHNIQUE: Gray-scale sonography with graded compression, as well as color Doppler and duplex ultrasound were performed to evaluate the lower extremity deep venous systems from the level of the common femoral vein and including the common femoral, femoral, profunda femoral, popliteal and calf veins including the posterior tibial, peroneal and gastrocnemius veins when visible. The superficial great saphenous vein was also interrogated. Spectral Doppler was utilized to evaluate flow at rest and with distal augmentation maneuvers in the common femoral, femoral and popliteal veins.  COMPARISON:  None.  FINDINGS: Contralateral Common Femoral Vein: Respiratory phasicity is normal and symmetric with the symptomatic side. No evidence of thrombus. Normal compressibility.  Common Femoral Vein: No evidence of thrombus. Normal compressibility, respiratory  phasicity and response to augmentation.  Saphenofemoral Junction: No evidence of thrombus. Normal compressibility and flow on color Doppler imaging.  Profunda Femoral Vein: No evidence of thrombus. Normal compressibility and flow on color Doppler imaging.  Femoral Vein: No evidence of thrombus. Normal compressibility, respiratory phasicity and response to augmentation.  Popliteal Vein: No evidence of thrombus. Normal compressibility, respiratory phasicity and response to augmentation.  Calf Veins: No evidence of thrombus. Normal compressibility and flow on color Doppler imaging.  Superficial Great Saphenous Vein: No evidence of thrombus. Normal compressibility and flow on color Doppler imaging.  Venous Reflux:  None.  Other Findings: Subcutaneous edema is noted at the level of the calf and ankle (representative images 38, 39 and 40).  IMPRESSION: No evidence of DVT within the right lower extremity.   Electronically Signed   By: Sandi Mariscal M.D.   On: 11/08/2014 15:41   Ct Biopsy  11/23/2014   INDICATION: History of lung cancer, now with infiltrative right-sided paraspinal mass adjacent to the caudal aspect of the lumbar spine extending to involve the lumbar sacral junction. Please perform CT-guided biopsy for tissue diagnostic purposes  EXAM: CT-GUIDED BIOPSY OF INFILTRATIVE RIGHT PARASPINAL MASS.  COMPARISON:  Lumbar spine CT - 11/08/2014  MEDICATIONS: Fentanyl 25 mcg IV; Versed 1 mg IV  ANESTHESIA/SEDATION: Sedation time  8 minutes  CONTRAST:  None  COMPLICATIONS: None immediate  PROCEDURE: Informed consent was obtained from the patient following an explanation of the procedure, risks, benefits and alternatives. A time out was performed prior to the initiation of the procedure.  The patient was positioned prone on the CT table and a limited CT was performed for procedural planning demonstrating unchanged appearance of the note expansile paraspinal mass adjacent to the caudal aspect of the lumbar spine measuring  at least 5.6 x 7.3 cm in diameter (image 16, series 2). The procedure was planned. The operative site was prepped and draped in the usual sterile fashion. Appropriate trajectory was confirmed with a 22 gauge spinal needle after the adjacent tissues were anesthetized with 1% Lidocaine with epinephrine.  Under intermittent CT guidance, a 17 gauge coaxial needle was advanced into the peripheral aspect of the mass. Appropriate positioning was confirmed and 6 core needle biopsy samples were obtained with an 18 gauge core needle biopsy device. The co-axial needle was removed and hemostasis was achieved with manual compression.  A limited postprocedural CT was negative for hemorrhage or additional complication. A dressing was placed. The patient tolerated the procedure well without immediate postprocedural complication.  IMPRESSION: Technically successful CT  guided core needle biopsy of expansile right-sided paraspinal mass adjacent to the caudal aspect of the lumbar spine.   Electronically Signed   By: Sandi Mariscal M.D.   On: 11/23/2014 10:13   Dg Chest Port 1 View  11/24/2014   CLINICAL DATA:  Fever.  Possible sepsis.  Initial encounter.  EXAM: PORTABLE CHEST - 1 VIEW  COMPARISON:  Chest radiograph performed 07/29/2014, and CT of the chest performed 06/27/2014  FINDINGS: The lungs are well-aerated. Vascular congestion is noted, with mild bibasilar scarring. There is no evidence of pleural effusion or pneumothorax. Known left-sided pulmonary nodules are partially characterized on radiograph.  The cardiomediastinal silhouette is mildly enlarged. No acute osseous abnormalities are seen.  IMPRESSION: 1. Vascular congestion and mild cardiomegaly, with mild bibasilar scarring. No definite evidence of pneumonia. 2. Known left-sided pulmonary nodules are partially characterized on radiograph.   Electronically Signed   By: Garald Balding M.D.   On: 11/24/2014 06:59   Ir Nephrostomy Placement Right  11/24/2014   CLINICAL  DATA:  81 year old with a right paraspinal mass, mild right hydronephrosis and urinary infection. Request for percutaneous nephrostomy tube.  EXAM: RIGHT PERCUTANEOUS NEPHROSTOMY TUBE PLACEMENT WITH ULTRASOUND AND FLUOROSCOPIC GUIDANCE  Physician: Stephan Minister. Henn, MD  FLUOROSCOPY TIME:  5 minutes and 6 seconds.  37  mGy  MEDICATIONS: 0.5 mg Versed, 25 mcg fentanyl. A radiology nurse monitored the patient for moderate sedation.  Patient is already receiving antibiotics and additional antibiotics were not given for this procedure.  ANESTHESIA/SEDATION: Moderate sedation time: 26 minutes  PROCEDURE: Informed consent was obtained for a percutaneous nephrostomy tube. Patient was placed prone. The right kidney was identified with ultrasound. The right flank was prepped and draped in sterile fashion. Maximal barrier sterile technique was utilized including caps, mask, sterile gowns, sterile gloves, sterile drape, hand hygiene and skin antiseptic. Skin was anesthetized with 1% lidocaine. A 22 gauge needle was directed into the right mid/lower pole collecting system with ultrasound guidance. Contrast injection confirmed placement in the renal collecting system. A 0.018 wire was advanced. An Accustick dilator set was placed. Tract was dilated to accommodate a 10.2 [place] multipurpose drain. The catheter was reconstituted in the renal pelvis. Contrast injection confirmed appropriate positioning. Catheter was attached to a gravity bag and sutured in place. Bandage was placed at the catheter site. Fluoroscopic and ultrasound images were taken and saved for documentation.  FINDINGS: Mild dilatation of the right renal collecting system. Access was obtained from the mid/lower pole. Small amount of contrast was draining distal to the obstructing lesion.  Estimated blood loss: Minimal  COMPLICATIONS: None  IMPRESSION: Successful placement of a right percutaneous nephrostomy tube with ultrasound and fluoroscopic guidance.    Electronically Signed   By: Markus Daft M.D.   On: 11/24/2014 17:11     PERTINENT LAB RESULTS: CBC:  Recent Labs  11/27/14 0426  WBC 9.3  HGB 8.6*  HCT 27.0*  PLT 272   CMET CMP     Component Value Date/Time   NA 134* 11/29/2014 0403   NA 137 11/15/2014 1445   NA 143 12/23/2011 0830   K 4.4 11/29/2014 0403   K 3.9 11/15/2014 1445   K 4.7 12/23/2011 0830   CL 102 11/29/2014 0403   CL 104 06/19/2012 0958   CL 101 12/23/2011 0830   CO2 22 11/29/2014 0403   CO2 24 11/15/2014 1445   CO2 27 12/23/2011 0830   GLUCOSE 93 11/29/2014 0403   GLUCOSE 125 11/15/2014 1445  GLUCOSE 95 06/19/2012 0958   GLUCOSE 108 12/23/2011 0830   BUN 52* 11/29/2014 0403   BUN 54.2* 11/15/2014 1445   BUN 25* 12/23/2011 0830   CREATININE 2.57* 11/29/2014 0403   CREATININE 2.8* 11/15/2014 1445   CREATININE 2.09* 02/01/2014 1545   CALCIUM 8.4* 11/29/2014 0403   CALCIUM 9.5 11/15/2014 1445   CALCIUM 9.0 12/23/2011 0830   PROT 6.4* 11/24/2014 0555   PROT 7.5 11/15/2014 1445   PROT 7.1 12/23/2011 0830   ALBUMIN 2.5* 11/24/2014 0555   ALBUMIN 2.9* 11/15/2014 1445   AST 21 11/24/2014 0555   AST 21 11/15/2014 1445   AST 16 12/23/2011 0830   ALT 10* 11/24/2014 0555   ALT 12 11/15/2014 1445   ALT 16 12/23/2011 0830   ALKPHOS 103 11/24/2014 0555   ALKPHOS 137 11/15/2014 1445   ALKPHOS 76 12/23/2011 0830   BILITOT 0.7 11/24/2014 0555   BILITOT 0.68 11/15/2014 1445   BILITOT 0.60 12/23/2011 0830   GFRNONAA 17* 11/29/2014 0403   GFRAA 19* 11/29/2014 0403    GFR Estimated Creatinine Clearance: 18.2 mL/min (by C-G formula based on Cr of 2.57). No results for input(s): LIPASE, AMYLASE in the last 72 hours. No results for input(s): CKTOTAL, CKMB, CKMBINDEX, TROPONINI in the last 72 hours. Invalid input(s): POCBNP No results for input(s): DDIMER in the last 72 hours. No results for input(s): HGBA1C in the last 72 hours. No results for input(s): CHOL, HDL, LDLCALC, TRIG, CHOLHDL, LDLDIRECT in the  last 72 hours. No results for input(s): TSH, T4TOTAL, T3FREE, THYROIDAB in the last 72 hours.  Invalid input(s): FREET3 No results for input(s): VITAMINB12, FOLATE, FERRITIN, TIBC, IRON, RETICCTPCT in the last 72 hours. Coags: No results for input(s): INR in the last 72 hours.  Invalid input(s): PT Microbiology: Recent Results (from the past 240 hour(s))  Urine culture     Status: None   Collection Time: 11/23/14  8:22 AM  Result Value Ref Range Status   Specimen Description URINE, CLEAN CATCH  Final   Special Requests NONE  Final   Culture   Final    >=100,000 COLONIES/mL PROTEUS MIRABILIS Performed at [facility]    Report Status 11/26/2014 FINAL  Final   Organism ID, Bacteria PROTEUS MIRABILIS  Final      Susceptibility   Proteus mirabilis - MIC*    AMPICILLIN <=2 SENSITIVE Sensitive     CEFAZOLIN <=4 SENSITIVE Sensitive     CEFTRIAXONE <=1 SENSITIVE Sensitive     CIPROFLOXACIN <=0.25 SENSITIVE Sensitive     GENTAMICIN >=16 RESISTANT Resistant     IMIPENEM 2 SENSITIVE Sensitive     NITROFURANTOIN 128 RESISTANT Resistant     TRIMETH/SULFA <=20 SENSITIVE Sensitive     AMPICILLIN/SULBACTAM <=2 SENSITIVE Sensitive     PIP/TAZO <=4 SENSITIVE Sensitive     * >=100,000 COLONIES/mL PROTEUS MIRABILIS  Urine culture     Status: None   Collection Time: 11/23/14 11:24 PM  Result Value Ref Range Status   Specimen Description URINE, RANDOM  Final   Special Requests NONE  Final   Culture   Final    >=100,000 COLONIES/mL PROTEUS MIRABILIS Performed at [facility]    Report Status 11/26/2014 FINAL  Final   Organism ID, Bacteria PROTEUS MIRABILIS  Final      Susceptibility   Proteus mirabilis - MIC*    AMPICILLIN <=2 SENSITIVE Sensitive     CEFAZOLIN <=4 SENSITIVE Sensitive     CEFTRIAXONE <=1 SENSITIVE Sensitive  CIPROFLOXACIN <=0.25 SENSITIVE Sensitive     GENTAMICIN >=16 RESISTANT Resistant     IMIPENEM 2 SENSITIVE Sensitive     NITROFURANTOIN 128  RESISTANT Resistant     TRIMETH/SULFA <=20 SENSITIVE Sensitive     AMPICILLIN/SULBACTAM <=2 SENSITIVE Sensitive     PIP/TAZO <=4 SENSITIVE Sensitive     * >=100,000 COLONIES/mL PROTEUS MIRABILIS  Blood culture (routine x 2)     Status: None   Collection Time: 11/24/14  1:22 AM  Result Value Ref Range Status   Specimen Description BLOOD LEFT FOREARM  Final   Special Requests BOTTLES DRAWN AEROBIC AND ANAEROBIC 5CC  Final   Culture  Setup Time   Final    GRAM NEGATIVE RODS IN BOTH AEROBIC AND ANAEROBIC BOTTLES CRITICAL RESULT CALLED TO, READ BACK BY AND VERIFIED WITH: Lynnae January RN 2200 11/24/14 A BROWNING CONFIRMED BY M CAMPBELL    Culture   Final    PROTEUS MIRABILIS Performed at [facility]    Report Status 11/27/2014 FINAL  Final   Organism ID, Bacteria PROTEUS MIRABILIS  Final      Susceptibility   Proteus mirabilis - MIC*    AMPICILLIN <=2 SENSITIVE Sensitive     CEFAZOLIN <=4 SENSITIVE Sensitive     CEFEPIME <=1 SENSITIVE Sensitive     CEFTAZIDIME <=1 SENSITIVE Sensitive     CEFTRIAXONE <=1 SENSITIVE Sensitive     CIPROFLOXACIN <=0.25 SENSITIVE Sensitive     GENTAMICIN >=16 RESISTANT Resistant     IMIPENEM 0.5 SENSITIVE Sensitive     TRIMETH/SULFA <=20 SENSITIVE Sensitive     AMPICILLIN/SULBACTAM <=2 SENSITIVE Sensitive     PIP/TAZO <=4 SENSITIVE Sensitive     * PROTEUS MIRABILIS  Blood culture (routine x 2)     Status: None   Collection Time: 11/24/14  1:22 AM  Result Value Ref Range Status   Specimen Description RIGHT ANTECUBITAL  Final   Special Requests BOTTLES DRAWN AEROBIC AND ANAEROBIC 5CC  Final   Culture  Setup Time   Final    GRAM NEGATIVE RODS ANAEROBIC BOTTLE ONLY CRITICAL RESULT CALLED TO, READ BACK BY AND VERIFIED WITH: Lynnae January RN 2200 11/24/14 A BROWNING CONFIRMED BY M CAMPBELL    Culture   Final    PROTEUS MIRABILIS SUSCEPTIBILITIES PERFORMED ON PREVIOUS CULTURE WITHIN THE LAST 5 DAYS. Performed at [facility]    Report Status  11/27/2014 FINAL  Final  Culture, blood (routine x 2)     Status: None (Preliminary result)   Collection Time: 11/25/14 12:32 PM  Result Value Ref Range Status   Specimen Description BLOOD RIGHT ANTECUBITAL  Final   Special Requests BOTTLES DRAWN AEROBIC AND ANAEROBIC 10CC EACH  Final   Culture   Final    NO GROWTH 3 DAYS Performed at [facility]    Report Status PENDING  Incomplete  Culture, blood (routine x 2)     Status: None (Preliminary result)   Collection Time: 11/25/14 12:36 PM  Result Value Ref Range Status   Specimen Description BLOOD RIGHT HAND  Final   Special Requests BOTTLES DRAWN AEROBIC ONLY 3CC  Final   Culture   Final    NO GROWTH 3 DAYS Performed at [facility]    Report Status PENDING  Incomplete     BRIEF HOSPITAL COURSE:  Brief narrative:  81 year old female with known history of stage IV lung cancer on observation admitted with worsening back pain and right lower leg swelling.Further evaluation with a CT scan  showed a large right paraspinal mass encasing the right ureter and causing hydronephrosis. She was also found to have a right lower extremity DVT. Patient subsequently underwent CT-guided biopsy on 6/22 which shows metastatic squamous cell cancer. Patient also underwent right percutaneous nephrostomy, further workup demonstrated Proteus UTI and bacteremia.  Hospital course by problem list: Proteus bacteremia with sepsis: Likely etiology UTI. Managed with intravenous Primaxin-has numerous allergies-mostly generalized hives-therefore will need to place midline PICC and continue with IV Abx for a total of 2 weeks. CT abdomen does not show any other foci of infection. Spoke with pharmacy, will switch to Colbert Ewing and continue for 8 more days from 6/28 to complete a 14 day course.  Please note: This M.D. discussed with patient in front of RN-explained that we need a PICC line, explained rationale and underlying CKD. Patient does not want a midline  PICC, she wants a regular PICC line placed. She knows that she has chronic kidney disease, and if and when she develops ESRD-she claims she will not go dialysis. Hence will place PICC line, patient and daughter fully aware of and accepting poor venous access risk if and when patient ends up on dialysis.  Active Problems: Large right paraspinal mass: Known stage IV lung cancer and was on observation with close follow-up with oncology. Patient underwent CT-guided biopsy on 6/22, which shows metastatic squamous carcinoma. Evaluated by Dr. Julien Nordmann, recommended radiation oncology evaluation. Radiation oncology evaluation was completed, patient will undergo outpatient radiation treatment beginning 7/5. Please ensure patient follows up with radiation oncology  Right Foot Drop:occurred 5 weeks prior to admission (per patient)-likely secondary to right parapsinal mass. MRI of her lumbar spine showed a large right paraspinal mass with osseous infusion, with it affecting the right L4, L5 and S1 no bruit. Will need radiation oncology-see above  Bilateral lower extremity DVT: Likely from above mass encasing vessels. Started on IV heparin and overlapping Coumadin-this M.D. spoke with Dr. Mohamed-recommendations were to see if pharmacy could dose Lovenox given active cancer. After discussion with pharmacy, patient was started on Lovenox. Factor X a levels were low, Lovenox was increased to 80 mg daily on 6/28 (prior dose was 70 mg daily). Please recheck factor X a levels in the next 3-4 days and adjust dosing of Lovenox accordingly.  Acute on chronic kidney disease stage IV: Acute renal failure likely secondary to hydronephrosis/obstruction. Improving and now close to usual baseline. Continue to follow closely.  Hydronephrosis of the right kidney: Secondary to a large right paraspinal mass encasing the right ureter. Subsequently underwent right percutaneous nephrostomy tube placement. Urology was consulted, please  ensure patient follows up with [facility] for further care and perhaps internalization of the nephrostomy tube in the next few weeks.  Anemia: Likely secondary to acute acute illness-has anemia of chronic kidney disease/malignancy. Hb remains stable. Follow closely in the outpatient setting  Chronic diastolic heart failure: Compensated. Resume Lasix  History of CAD: Currently without chest pain or shortness of breath. Has history of bare metal stent to RCA in 2007-and was on Plavix prior to admission-Held for biopsy.Spoke with Dr. Burt Knack over the phone on 6/27, he reviewed the chart, since patient now with worsening cancer burden, worsening anemia-and since patient actually had a bare metal stent-okay to leave off Plavix as patient will be on Lovenox. I subsequently discussed this with both patient and her daughter at bedside on 6/28, they were agreeable with Dr. Antionette Char recommendation. T  Hypertension: Continue clonidine and metoprolol. Resume Lasix. Follow and titrate  accordingly  TODAY-DAY OF DISCHARGE:  Subjective:   Kynzli Rease today has no headache,no chest abdominal pain,no new weakness tingling or numbness. Did use to have right-sided back pain and right leg pain.  Objective:   Blood pressure 150/78, pulse 88, temperature 98.1 F (36.7 C), temperature source Oral, resp. rate 16, height 5' 8.5" (1.74 m), weight 70 kg (154 lb 5.2 oz), SpO2 99 %.  Intake/Output Summary (Last 24 hours) at 11/29/14 1146 Last data filed at 11/29/14 0619  Gross per 24 hour  Intake    360 ml  Output    300 ml  Net     60 ml   Filed Weights   11/24/14 0440 11/25/14 1203  Weight: 65.772 kg (145 lb) 70 kg (154 lb 5.2 oz)    Exam Awake Alert, Oriented *3, No new F.N deficits, Normal affect [place].AT,PERRAL Supple Neck,No JVD, No cervical lymphadenopathy appriciated.  Symmetrical Chest wall movement, Good air movement bilaterally, CTAB RRR,No Gallops,Rubs or new Murmurs, No Parasternal  Heave +ve B.Sounds, Abd Soft, Non tender, No organomegaly appriciated, No rebound -guarding or rigidity. No Cyanosis, Clubbing or edema, No new Rash or bruise. Has right-sided lower extremity swelling.  DISCHARGE CONDITION: Stable  DISPOSITION: SNF  DISCHARGE INSTRUCTIONS:    Activity:  As tolerated with Full fall precautions use walker/cane & assistance as needed  Diet recommendation: Heart Healthy diet  Discharge Instructions    Call MD for:  difficulty breathing, headache or visual disturbances    Complete by:  As directed      Call MD for:  persistant nausea and vomiting    Complete by:  As directed      Call MD for:  severe uncontrolled pain    Complete by:  As directed      Diet - low sodium heart healthy    Complete by:  As directed      Increase activity slowly    Complete by:  As directed            Follow-up Information    Follow up with Delphina Cahill, MD. Schedule an appointment as soon as possible for a visit in 2 weeks.   Specialty:  Internal Medicine   Contact information:    [place] [address] [phone]       Follow up with Jorja Loa, MD. Schedule an appointment as soon as possible for a visit in 2 weeks.   Specialty:  Urology   Contact information:   [place] [address] [phone]       Follow up with Eilleen Kempf., MD. Schedule an appointment as soon as possible for a visit in 1 week.   Specialty:  Oncology   Contact information:   [address] [place] [address] [phone]       Follow up with Arloa Koh, MD On 12/06/2014.   Specialty:  Radiation Oncology   Why:  appointment at 11:40 am to begin Radiation treatment   Contact information:   [place] [id number] [phone]      Total Time spent on discharge equals  45 minutes.  SignedOren Binet 11/29/2014 11:46 AM

## 2014-11-29 NOTE — Progress Notes (Signed)
CSW followed up with pt and pt daughter regarding SNF bed offers. [facility] was able to offer pt a bed and pt and pt daughter want to accept bed offer from [facility].  Per MD, pt medically ready for discharge today.   CSW contacted [facility] and confirmed facility has bed available today.   CSW facilitated pt discharge needs including contacting facility, faxing pt discharge information to [facility], discussing with pt and pt daughter at bedside, providing RN phone number to call report, and arranging ambulance transport for pt to [facility].   Pt and pt daughter grateful for support and assistance and eager to get to [facility] for continued rehabilitation.   No further social work needs identified at this time.  CSW signing off.   Alison Murray, MSW, [place] [facility] [phone]

## 2014-11-29 NOTE — Care Management (Signed)
Important Message  Patient Details  Name: [name] MRN: [id number] Date of Birth: 12-20-1933   Medicare Important Message Given:       Camillo Flaming, Friend 11/29/2014, 3:28 PM

## 2014-11-29 NOTE — Progress Notes (Signed)
Peripherally Inserted Central Catheter/Midline Placement  The IV Nurse has discussed with the patient and/or persons authorized to consent for the patient, the purpose of this procedure and the potential benefits and risks involved with this procedure.  The benefits include less needle sticks, lab draws from the catheter and patient may be discharged home with the catheter.  Risks include, but not limited to, infection, bleeding, blood clot (thrombus formation), and puncture of an artery; nerve damage and irregular heat beat.  Alternatives to this procedure were also discussed.  PICC/Midline Placement Documentation        [name] 11/29/2014, 2:57 PM

## 2014-11-29 NOTE — Progress Notes (Signed)
Pt leaving via EMS to be transported to [facility]

## 2014-11-29 NOTE — Progress Notes (Signed)
Patient ID: [name], female   DOB: 06-30-1933, 81 y.o.   MRN: [id number]    Referring Physician(s): Dahlstedt,S  Subjective:  Pt doing better; has some occ nausea but no sig flank pain  Allergies: Amlodipine; Ciprofloxacin; Statins; Aspirin; Cephalexin; Hydralazine; Iron; Sulfonamide derivatives; and Penicillins  Medications: Prior to Admission medications   Medication Sig Start Date End Date Taking? Authorizing Provider  cloNIDine (CATAPRES) 0.2 MG tablet Take 1 tablet (0.2 mg total) by mouth 2 (two) times daily. 05/16/14  Yes Liliane Shi, PA-C  clopidogrel (PLAVIX) 75 MG tablet TAKE ONE TABLET DAILY. 04/06/14  Yes Sherren Mocha, MD  furosemide (LASIX) 40 MG tablet Take 1 tablet (40 mg total) by mouth daily. 02/03/14  Yes Sherren Mocha, MD  HYDROcodone-acetaminophen (NORCO/VICODIN) 5-325 MG per tablet Take 1 tablet by mouth every 6 (six) hours as needed. 11/15/14  Yes Maryanna Shape, NP  loperamide (IMODIUM A-D) 2 MG tablet Take 2 mg by mouth 4 (four) times daily as needed for diarrhea or loose stools (diarrhea).   Yes Historical Provider, MD  metoprolol succinate (TOPROL-XL) 25 MG 24 hr tablet TAKE 1/2 TABLET BY MOUTH AT BEDTIME. 08/26/14  Yes Sherren Mocha, MD  pantoprazole (PROTONIX) 40 MG tablet TAKE ONE TABLET BY MOUTH ONCE DAILY. 10/14/14  Yes Sherren Mocha, MD  LORazepam (ATIVAN) 1 MG tablet Take 1 tablet (1 mg total) by mouth every 8 (eight) hours. 11/16/14   Maryanna Shape, NP     Vital Signs: BP 150/78 mmHg  Pulse 88  Temp(Src) 98.1 F (36.7 C) (Oral)  Resp 16  Ht 5' 8.5" (1.74 m)  Wt 154 lb 5.2 oz (70 kg)  BMI 23.12 kg/m2  SpO2 99%  Physical Exam right PCN intact, insertion site ok, NT, output 300 cc's clear light yellow urine; cx's- proteus  Imaging: Mr Brain Wo Contrast  11/28/2014   CLINICAL DATA:  Metastatic lung cancer, staging. No reported neurologic symptoms.  EXAM: MRI HEAD WITHOUT CONTRAST  TECHNIQUE: Multiplanar, multiecho pulse sequences of  the brain and surrounding structures were obtained without intravenous contrast.  COMPARISON:  CT head 07/28/2014.  FINDINGS: The patient was unable to remain motionless for the exam. Small or subtle lesions could be overlooked.  The patient was unable to receive gadolinium because of poor renal function. Sensitivity in the detection of metastatic disease is reduced.  No evidence for acute infarction, hemorrhage, mass lesion, hydrocephalus, or extra-axial fluid. Extensive atrophy. Severe chronic microvascular ischemic change. Prominent perivascular spaces. Tiny foci chronic hemorrhage LEFT lateral thalamus and RIGHT lentiform nucleus, likely hypertensive in nature.  Flow voids are maintained. Partial empty sella. No tonsillar herniation. Negative orbits. No sinus disease. Sclerotic mastoids.  IMPRESSION: Noncontrast motion degraded examination, demonstrating no definite intracranial metastatic disease.  Chronic changes as described with atrophy, small vessel disease, and small chronic microbleeds, probably hypertensive related.   Electronically Signed   By: Staci Righter M.D.   On: 11/28/2014 09:27   Mr Lumbar Spine Wo Contrast  11/28/2014   CLINICAL DATA:  Metastatic lung cancer.  RIGHT-sided pain.  EXAM: MRI LUMBAR SPINE WITHOUT CONTRAST  TECHNIQUE: Multiplanar, multisequence MR imaging of the lumbar spine was performed. No intravenous contrast was administered.  COMPARISON:  CT abdomen pelvis 11/23/2014.  FINDINGS: Large RIGHT paraspinous mass with osseous invasion centered at L5 having cross-sectional measurements as seen on series 7, image 38 of 78 x 72 mm. Craniocaudal extent as seen on image 3 series 4 of 112 mm. Significant RIGHT-sided tumor at L5  results in mild endplate softening and early compression. No retropulsion. No similar loss of height at L4 or S1. Minor tumor involvement in S2.  Tumor within the extraforaminal compartment on the RIGHT at L4-5 and L5-S1 likely affecting the RIGHT-sided nerve  roots at that location. Tumor also abuts the first sacral nerve root as it exits from the ventral sacrum. No frank epidural tumor.  Vascular encasement of the RIGHT common iliac artery. Vascular displacement of the RIGHT common iliac vein. RIGHT hydronephrosis has been relieved by percutaneous drainage.  There is mild degenerative anterolisthesis L4 on L5 related to facet disease of approximately 2 mm. No significant disc protrusion. Mild stenosis at L4-5 related to slip, facet disease, and annular bulging.  IMPRESSION: Metastatic squamous cell carcinoma with a large RIGHT paraspinous mass and osseous invasion; estimated measurements 78 x 72 x 112 mm.  Within limits of evaluation on noncontrast scan, no definite epidural tumor.  Tumor within the extraforaminal compartment on the RIGHT at L4-5 and L5-S1 as well as along the RIGHT side of the sacrum likely affects the RIGHT L4, L5, and S1 nerve roots.  Mild pathologic endplate compression at L5 on the RIGHT. No retropulsion.   Electronically Signed   By: Staci Righter M.D.   On: 11/28/2014 10:14    Labs:  CBC:  Recent Labs  11/24/14 0555 11/25/14 0650 11/26/14 0137 11/27/14 0426  WBC 14.3* 10.5 9.9 9.3  HGB 9.2* 8.5* 8.1* 8.6*  HCT 29.1* 26.5* 25.3* 27.0*  PLT 278 240 247 272    COAGS:  Recent Labs  11/23/14 0730 11/24/14 0700 11/26/14 0137  INR 1.22 1.35 1.25  APTT 36 35  --     BMP:  Recent Labs  11/26/14 0137 11/27/14 0426 11/28/14 0425 11/29/14 0403  NA 133* 134* 134* 134*  K 4.3 4.4 4.9 4.4  CL 102 101 102 102  CO2 _0 GLUCOSE 105* 100* 87 93  BUN 62* 57* 55* 52*  CALCIUM 8.1* 8.6* 8.6* 8.4*  CREATININE 2.94* 2.80* 2.56* 2.57*  GFRNONAA 14* 15* 17* 17*  GFRAA 16* 17* 19* 19*    LIVER FUNCTION TESTS:  Recent Labs  06/27/14 1400 11/15/14 1445 11/23/14 2148 11/24/14 0555  BILITOT 0.51 0.68 0.6 0.7  AST _1 ALT 7 12 11* 10*  ALKPHOS 125 137 117 103  PROT 7.2 7.5 7.3 6.4*  ALBUMIN 3.3*  2.9* 3.0* 2.5*    Assessment and Plan: S/p right paraspinal mass bx 6/22- path revealed met sq cell ca from lung; s/p right PCN 6/23; urine cx- proteus pan sensitive- on primaxin; afebrile; last WBC nl; hgb 8.6; creat 2.57 (2.56); cont PCN for now as outlined by urology- f/u with Dr. Diona Fanti in 2-3 weeks as OP;other plans as per IM/oncology   Signed: D. Rowe Robert 11/29/2014, 9:14 AM   I spent a total of 15 minutes in face to face in clinical consultation/evaluation, greater than 50% of which was counseling/coordinating care for right perc nephrostomy

## 2014-11-29 NOTE — Clinical Social Work Placement (Addendum)
   CLINICAL SOCIAL WORK PLACEMENT  NOTE  Date:  11/29/2014  Patient Details  Name: [name] MRN: [id number] Date of Birth: 15-Sep-1933  Clinical Social Work is seeking post-discharge placement for this patient at the [place] level of care (*CSW will initial, date and re-position this form in  chart as items are completed):  Yes   Patient/family provided with [place] Work Department's list of facilities offering this level of care within the geographic area requested by the patient (or if unable, by the patient's family).  Yes   Patient/family informed of their freedom to choose among providers that offer the needed level of care, that participate in Medicare, Medicaid or managed care program needed by the patient, have an available bed and are willing to accept the patient.  Yes   Patient/family informed of [facility]'s ownership interest in [facility] and [facility], as well as of the fact that they are under no obligation to receive care at these facilities.  PASRR submitted to EDS on 11/29/14     PASRR number received on 11/29/14     Existing PASRR number confirmed on       FL2 transmitted to all facilities in geographic area requested by pt/family on 11/29/14     FL2 transmitted to all facilities within larger geographic area on       Patient informed that his/her managed care company has contracts with or will negotiate with certain facilities, including the following:        Yes   Patient/family informed of bed offers received.  Patient chooses bed at [facility]     Physician recommends and patient chooses bed at      Patient to be transferred to [facility] on 11/29/14.  Patient to be transferred to facility by ambulance [facility])     Patient family notified on 11/29/14 of transfer.  Name of family member notified:  Pt and pt daughter, [name] notified at bedside  PHYSICIAN Please sign FL2      Additional Comment:    _______________________________________________ Ladell Pier, LCSW 11/29/2014, 3:28 PM

## 2014-11-29 NOTE — Progress Notes (Signed)
Report called to RN named Davonna Belling at [facility]. No current questions or concerns. PICC line just placed for IV antibiotic treatment after discharge from the hospital. Pt stable at the moment and will continue to monitor until transportation arrives

## 2014-11-29 NOTE — Progress Notes (Signed)
ANTICOAGULATION CONSULT NOTE - Follow-Up  Pharmacy Consult for Lovenox Indication:  VTE treatment   Allergies  Allergen Reactions  . Amlodipine Swelling  . Ciprofloxacin Hives and Other (See Comments)    REACTION: weakness  . Statins Other (See Comments)  . Aspirin Swelling    Mouth swelling, tongue  . Cephalexin Hives  . Hydralazine Nausea And Vomiting  . Iron Nausea And Vomiting  . Sulfonamide Derivatives Hives  . Penicillins Other (See Comments)    Reaction is unknown    Patient Measurements: Height: 5' 8.5" (174 cm) Weight: 154 lb 5.2 oz (70 kg) IBW/kg (Calculated) : 65.05  Vital Signs: Temp: 98.1 F (36.7 C) (06/28 0500) Temp Source: Oral (06/28 0500) BP: 150/78 mmHg (06/28 0500) Pulse Rate: 88 (06/28 0500)  Labs:  Recent Labs  11/26/14 1006 11/27/14 0426 11/28/14 0425 11/29/14 0403  HGB  --  8.6*  --   --   HCT  --  27.0*  --   --   PLT  --  272  --   --   HEPARINUNFRC 0.58  --   --   --   CREATININE  --  2.80* 2.56* 2.57*    Estimated Creatinine Clearance: 18.2 mL/min (by C-G formula based on Cr of 2.57).   Medical History: Past Medical History  Diagnosis Date  . Hypertension   . Hypercholesterolemia   . CKD (chronic kidney disease)   . Coronary artery disease     a.  LHC (06/04/05): LHC done in [place] with high grade RCA => s/p BMS to RCA;  b.  Nuclear (09/14/09): Lexiscan; Inf infarct with mild peri-infarct ishemia, EF 52%; Low Risk.  Marland Kitchen GERD (gastroesophageal reflux disease)   . Chronic anemia   . Ischemic cardiomyopathy     a. Echo (07/26/13): Mild LVH, EF 35-40%, diff HK, inf AK, Gr 2 DD, Tr AI, mildly dilated Ao root, MAC, mild MR, mild LAE, mod reduced RVSF.  . Non-small cell carcinoma of lung     Stage IV   Assessment: 67 yoF with stage 4 metastatic lung cancer and R paraspinal mass (s/p biopsy on 6/22). She also has a new right hydronephrosis -- s/p R perc nephrostomy placement on 6/23. She c/o RLE edema on admission and  Doppler doppler showed new BL DVT. Heparin drip started 6 hours after perc nephrostomy drain placement for thrombosis. Bridge to warfarin therapy started 6/24.  MD prefers for patient to be on Lovenox due to cancer history.  TRH and hematology aware of CKD-IV and wish to switch to Lovenox if not contraindicated.  PharmD had discussion with MD on 6/25 and although Lovenox use is not ideal in severe CKD, use is not contraindicated and think we can safely administer if we monitor anti-Xa levels.  Today, 11/29/2014:  Hgb low, but stable. Platelets WNL.  No bleeding noted.  CKD-IV.  CrCl~18 ml/min  Anti-Xa level on 6/27 = 0.38 units/ml (drawn ~ 4.5 hours after lovenox dose given today)  Note PTA plavix for hx BMS, cardiology ok per notes with stopping plavix as pt now on lovenox  Goal of Therapy:  4-hour Anti-Xa level 0.6-1 unit/ml Monitor platelets by anticoagulation protocol: Yes   Plan:  1.  Continue Lovenox 80 mg SQ q24h 2.  Check anti-Xa level tonight and make adjustments to dose as necessary. 3.  F/u CBC q72h. 4.  Monitor for s/s of bleeding.  Ralene Bathe, PharmD, BCPS 11/29/2014, 8:16 AM  Pager: [phone]

## 2014-11-30 ENCOUNTER — Non-Acute Institutional Stay (SKILLED_NURSING_FACILITY): Payer: Medicare HMO | Admitting: Internal Medicine

## 2014-11-30 ENCOUNTER — Other Ambulatory Visit: Payer: Self-pay | Admitting: *Deleted

## 2014-11-30 ENCOUNTER — Encounter: Payer: Self-pay | Admitting: Radiation Oncology

## 2014-11-30 ENCOUNTER — Telehealth: Payer: Self-pay | Admitting: Internal Medicine

## 2014-11-30 DIAGNOSIS — C799 Secondary malignant neoplasm of unspecified site: Secondary | ICD-10-CM | POA: Diagnosis not present

## 2014-11-30 DIAGNOSIS — I82401 Acute embolism and thrombosis of unspecified deep veins of right lower extremity: Secondary | ICD-10-CM

## 2014-11-30 DIAGNOSIS — G549 Nerve root and plexus disorder, unspecified: Secondary | ICD-10-CM | POA: Diagnosis not present

## 2014-11-30 DIAGNOSIS — IMO0002 Reserved for concepts with insufficient information to code with codable children: Secondary | ICD-10-CM

## 2014-11-30 DIAGNOSIS — Z510 Encounter for antineoplastic radiation therapy: Secondary | ICD-10-CM | POA: Diagnosis not present

## 2014-11-30 DIAGNOSIS — C801 Malignant (primary) neoplasm, unspecified: Secondary | ICD-10-CM | POA: Diagnosis not present

## 2014-11-30 DIAGNOSIS — D499 Neoplasm of unspecified behavior of unspecified site: Secondary | ICD-10-CM

## 2014-11-30 DIAGNOSIS — C3490 Malignant neoplasm of unspecified part of unspecified bronchus or lung: Secondary | ICD-10-CM | POA: Diagnosis not present

## 2014-11-30 LAB — CULTURE, BLOOD (ROUTINE X 2)
CULTURE: NO GROWTH
Culture: NO GROWTH

## 2014-11-30 MED ORDER — HYDROCODONE-ACETAMINOPHEN 5-325 MG PO TABS: 1.0000 | ORAL_TABLET | Freq: Four times a day (QID) | ORAL | Status: DC | PRN

## 2014-11-30 MED ORDER — LORAZEPAM 1 MG PO TABS: ORAL_TABLET | ORAL | Status: DC

## 2014-11-30 NOTE — Telephone Encounter (Signed)
[facility]  

## 2014-11-30 NOTE — Progress Notes (Signed)
Patient ID: [name], female   DOB: 12/28/1933, 81 y.o.   MRN: [id number]  Facility; [facility] Chief complaint; admission to SNF post admit to [facility] from 6/22 to 6/28  History; this is a 81 year old woman who has a history of lung cancer 9 years ago treated with chemotherapy only she did not have surgery or radiation. She did well up until recently. She started to develop right hip pain. She was seen by both her primary physician and orthopedics. The thought was she had trochanteric bursitis hour she did not respond to this therapy. She then developed increasing swelling of the leg and the right and back pain. Further evaluation showed a large right para's final mass encasing the right ureter and causing hydronephrosis. She was also found to have a right lower extremity DVT. She underwent placement of a right percutaneous nephrostomy. Biopsy of the mass was done and shows squamous cell carcinoma she then developed a Proteus UTI and bacteremia. She was treated with IV Primaxin which completed on 6/28.  I have not researched her old records if they exist on the cancer however the lung cancer was initially stage IV her biopsy shows metastatic squamous cell carcinoma she is seen Dr. Valere Dross of radiation oncology and is due to start radiation next week. The paraspinal mass is affecting right L4-L5 and S1 nerve roots.  Apparently the DVT was bilateral. The patient was initially on IV heparin to Coumadin however the recommendation from oncology was Lovenox with follow-up factor X A levels. Lovenox was recently increased on to 80 mg on 6/28 from 70 mg  Past Medical History  Diagnosis Date  . Hypertension   . Hypercholesterolemia   . CKD (chronic kidney disease)   . Coronary artery disease     a.  LHC (06/04/05): LHC done in [place] with high grade RCA => s/p BMS to RCA;  b.  Nuclear (09/14/09): Lexiscan; Inf infarct with mild peri-infarct ishemia, EF 52%; Low Risk.  Marland Kitchen GERD (gastroesophageal  reflux disease)   . Chronic anemia   . Ischemic cardiomyopathy     a. Echo (07/26/13): Mild LVH, EF 35-40%, diff HK, inf AK, Gr 2 DD, Tr AI, mildly dilated Ao root, MAC, mild MR, mild LAE, mod reduced RVSF.  . Non-small cell carcinoma of lung     Stage IV    Past Surgical History  Procedure Laterality Date  . Hematoma evacuation  December 2006    groin  . Appendectomy    . Cataract extraction    . Hemorroidectomy    . Laminectomy      Current Outpatient Prescriptions on File Prior to Visit  Medication Sig Dispense Refill  . cloNIDine (CATAPRES) 0.2 MG tablet Take 1 tablet (0.2 mg total) by mouth 3 (three) times daily.    Marland Kitchen enoxaparin (LOVENOX) 80 MG/0.8ML injection Inject 0.8 mLs (80 mg total) into the skin daily.    . ertapenem 0.5 g in sodium chloride 0.9 % 50 mL Inject 0.5 g into the vein daily. 8 more days from 11/29/14-and then discontinue. Please remove PICC line when antibiotics are discontinued.    . furosemide (LASIX) 40 MG tablet Take 1 tablet (40 mg total) by mouth daily. 90 tablet 3  . HYDROcodone-acetaminophen (NORCO/VICODIN) 5-325 MG per tablet Take 1 tablet by mouth every 6 (six) hours as needed for moderate pain. 30 tablet 0  . loperamide (IMODIUM A-D) 2 MG tablet Take 2 mg by mouth 4 (four) times daily as needed for diarrhea  or loose stools (diarrhea).    . LORazepam (ATIVAN) 1 MG tablet Take one tablet by mouth every 8 hours for anxiety 90 tablet 5  . metoprolol succinate (TOPROL-XL) 25 MG 24 hr tablet Take 1 tablet (25 mg total) by mouth daily.    . pantoprazole (PROTONIX) 40 MG tablet TAKE ONE TABLET BY MOUTH ONCE DAILY. 90 tablet 1  . polyethylene glycol (MIRALAX / GLYCOLAX) packet Take 17 g by mouth daily.      Social; the patient lives in [place]. Patient's daughter is nearby. The patient was independent the with ADLs and IDL septal a short time ago.  reports that she has quit smoking. Her smoking use included Cigarettes. She does not have any  smokeless tobacco history on file. She reports that she does not drink alcohol.  family history includes Heart failure in her mother; Lung cancer in her brother and sister; Stomach cancer in her brother. There is no history of Heart attack or Stroke.  Review of systems Respiratory no shortness of breath Cardiac no chest pain Abdomen no abdominal pain no diarrhea GU nephrostomy tube in place Musculoskeletal right hip and low back pain which seems to be responding to the small dose of Norco 5/325 that she has ordered Extremities; complaining of pain in her right foot over the metatarsal heads and both heels  Physical examination Gen. patient is awake conversational alert Respiratory clear entry bilaterally Cardiac heart sounds are normal no murmurs Abdomen no liver no spleen no tenderness no masses. Extremities; there is no edema in the left leg there is extensive edema right up to above the hip on the right. Both heels have stage I pressure ulcerations and I am assuming this is where the pain is coming from. She also has pain across the metatarsal heads on the right foot and this could be gout or pseudogout in this setting. GU; nephrostomy tube on the right appears to be draining well. Neurologic; she has very little use in the right leg she has some distal movement of her toes and ankle. She does not have antigravity strength in the right leg at all. Reflexes are present at both knee jerks. Toes are equivocal. She has decreased sensation in the right foot and right lower leg. Above-the-knee sensory sensation was intact there was no perianal sensory loss Skin; 2 small areas over her bilateral buttocks at roughly the lower sacral area. These are probably stage II I agree with Santyl and foam. Also likely has stage I pressure areas over both heels accounting for some of her pain  Impression/plan #1 extensive right paraspinal mass involving several nerve roots and her right ureter for which she  now has a nephrostomy tube. Biopsy of the mass shows squamous cell carcinoma. She is to start radiation next week. Her daughter says she will be transporting her I'm not sure if this is feasible #2 massive edema of the right leg. I think this is likely reflecting tumor encased in the right iliac artery as well as the right common, internal and external iliac veins. It also partially encircles the lowermost IVC. I am not completely certain whether this by itself is an indication for Lovenox, I don't see any other of her tests that show venous clot #3 extreme right leg weakness. There is no doubt reflects multilevel lumbar nerve root involvement by tumor. #4 pain in the right hip and low back which seems well-controlled by a small dose of hydrocodone 5 mg. Her daughter was complaining  that this seems to make her more confused #5 UTI with Proteus pyelonephritis and apparently bacteremia. 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 #6 hydronephrosis of the kidney secondary to a large right paraspinal mass encasing the right ureter now with a nephrostomy tube in place I am not sure who is following this. Next history of coronary artery disease with chronic diastolic heart failure. She has stents in the right coronary artery. The Plavix was put on hold after consultation with cardiology. #7 pressure ulcers as described. We'll need Santyl to the wounds on her upper bilateral buttock's and careful attention to pressure-relief for both heels #8 renal failure she seems to be running a creatinine in the mid to upper 2 range. At this point I'm not certain how much of this is acute versus chronic.          Study Result       CLINICAL DATA:  Metastatic lung cancer.  RIGHT-sided pain.   EXAM: MRI LUMBAR SPINE WITHOUT CONTRAST   TECHNIQUE: Multiplanar, multisequence MR imaging of the lumbar spine was performed. No intravenous contrast was administered.   COMPARISON:  CT abdomen pelvis 11/23/2014.    FINDINGS: Large RIGHT paraspinous mass with osseous invasion centered at L5 having cross-sectional measurements as seen on series 7, image 38 of 78 x 72 mm. Craniocaudal extent as seen on image 3 series 4 of 112 mm. Significant RIGHT-sided tumor at L5 results in mild endplate softening and early compression. No retropulsion. No similar loss of height at L4 or S1. Minor tumor involvement in S2.   Tumor within the extraforaminal compartment on the RIGHT at L4-5 and L5-S1 likely affecting the RIGHT-sided nerve roots at that location. Tumor also abuts the first sacral nerve root as it exits from the ventral sacrum. No frank epidural tumor.   Vascular encasement of the RIGHT common iliac artery. Vascular displacement of the RIGHT common iliac vein. RIGHT hydronephrosis has been relieved by percutaneous drainage.   There is mild degenerative anterolisthesis L4 on L5 related to facet disease of approximately 2 mm. No significant disc protrusion. Mild stenosis at L4-5 related to slip, facet disease, and annular bulging.   IMPRESSION: Metastatic squamous cell carcinoma with a large RIGHT paraspinous mass and osseous invasion; estimated measurements 78 x 72 x 112 mm.   Within limits of evaluation on noncontrast scan, no definite epidural tumor.   Tumor within the extraforaminal compartment on the RIGHT at L4-5 and L5-S1 as well as along the RIGHT side of the sacrum likely affects the RIGHT L4, L5, and S1 nerve roots.   Mild pathologic endplate compression at L5 on the RIGHT. No retropulsion.     Electronically Signed   By: Staci Righter M.D.   On: 11/28/2014 10:14       Results for MADIHA, BAMBRICK (MRN [id number]) as of 11/30/2014 11:34  Ref. Range 11/26/2014 01:37 11/26/2014 10:06 11/27/2014 04:26 11/28/2014 04:25 11/28/2014 09:08 11/28/2014 09:34 11/28/2014 20:42 11/29/2014 04:03  Sodium Latest Ref Range: 135-145 mmol/L 133 (L)  134 (L) 134 (L)    134 (L)  Potassium Latest Ref Range:  3.5-5.1 mmol/L 4.3  4.4 4.9    4.4  Chloride Latest Ref Range: 101-111 mmol/L 102  101 102    102  CO2 Latest Ref Range: 22-32 mmol/L $RemoveBefo'22  23 22    22  'arcTpKAuHUv$ BUN Latest Ref Range: 6-20 mg/dL 62 (H)  57 (H) 55 (H)    52 (H)  Creatinine Latest Ref Range: 0.44-1.00 mg/dL 2.94 (  H)  2.80 (H) 2.56 (H)    2.57 (H)  Calcium Latest Ref Range: 8.9-10.3 mg/dL 8.1 (L)  8.6 (L) 8.6 (L)    8.4 (L)  EGFR (Non-African Amer.) Latest Ref Range: >60 mL/min 14 (L)  15 (L) 17 (L)    17 (L)  EGFR (African American) Latest Ref Range: >60 mL/min 16 (L)  17 (L) 19 (L)    19 (L)  Glucose Latest Ref Range: 65-99 mg/dL 105 (H)  100 (H) 87    93  Anion gap Latest Ref Range: 5-$RemoveBefo'15  9  10 10    10  'EfZsBVOwYah$ WBC Latest Ref Range: 4.0-10.5 K/uL 9.9  9.3       RBC Latest Ref Range: 3.87-5.11 MIL/uL 3.16 (L)  3.30 (L)       Hemoglobin Latest Ref Range: 12.0-15.0 g/dL 8.1 (L)  8.6 (L)       HCT Latest Ref Range: 36.0-46.0 % 25.3 (L)  27.0 (L)       MCV Latest Ref Range: 78.0-100.0 fL 80.1  81.8       MCH Latest Ref Range: 26.0-34.0 pg 25.6 (L)  26.1       MCHC Latest Ref Range: 30.0-36.0 g/dL 32.0  31.9       RDW Latest Ref Range: 11.5-15.5 % 14.3  14.6       Platelets Latest Ref Range: 150-400 K/uL 247  272

## 2014-11-30 NOTE — Telephone Encounter (Signed)
toya from pt living facility called to sched pt for hospital followup....done....she is aware of his appt

## 2014-11-30 NOTE — Progress Notes (Signed)
3-D simulation note: The patient completed 3-D simulation for treatment to her right paraspinal/LS spine metastasis.  She was set up with 4 dynamic conformal arcs utilizing both 6 MV and 15 MV photons (2 each).  We did shave some of the PTV away from the posterior aspect of the field to decrease dose to the posterior skin surface.  Based on the 4 arcs she has 4 complex treatment devices.  Dose volume histograms were obtained for the bowel, skin, cauda equina, and kidneys.  We met our departmental guidelines.  I'm prescribing 3005 cGy in 14 sessions.

## 2014-11-30 NOTE — Telephone Encounter (Signed)
[facility] 

## 2014-12-05 ENCOUNTER — Encounter (HOSPITAL_COMMUNITY)
Admission: RE | Admit: 2014-12-05 | Discharge: 2014-12-05 | Disposition: A | Discharge status: Home / Self Care | Payer: Medicare Other | Source: Skilled Nursing Facility | Attending: Internal Medicine | Admitting: Internal Medicine

## 2014-12-05 ENCOUNTER — Non-Acute Institutional Stay (SKILLED_NURSING_FACILITY): Payer: Medicare HMO | Admitting: Internal Medicine

## 2014-12-05 DIAGNOSIS — E8809 Other disorders of plasma-protein metabolism, not elsewhere classified: Secondary | ICD-10-CM

## 2014-12-05 DIAGNOSIS — G549 Nerve root and plexus disorder, unspecified: Secondary | ICD-10-CM | POA: Diagnosis not present

## 2014-12-05 DIAGNOSIS — E46 Unspecified protein-calorie malnutrition: Secondary | ICD-10-CM

## 2014-12-05 DIAGNOSIS — G55 Nerve root and plexus compressions in diseases classified elsewhere: Secondary | ICD-10-CM

## 2014-12-05 LAB — COMPREHENSIVE METABOLIC PANEL
ALT: 6 U/L — ABNORMAL LOW (ref 14–54)
AST: 16 U/L (ref 15–41)
Albumin: 2.1 g/dL — ABNORMAL LOW (ref 3.5–5.0)
Alkaline Phosphatase: 87 U/L (ref 38–126)
Anion gap: 10 (ref 5–15)
BUN: 43 mg/dL — ABNORMAL HIGH (ref 6–20)
CHLORIDE: 98 mmol/L — AB (ref 101–111)
CO2: 26 mmol/L (ref 22–32)
CREATININE: 2.43 mg/dL — AB (ref 0.44–1.00)
Calcium: 8.3 mg/dL — ABNORMAL LOW (ref 8.9–10.3)
GFR, EST AFRICAN AMERICAN: 21 mL/min — AB (ref >60)
GFR, EST NON AFRICAN AMERICAN: 18 mL/min — AB (ref >60)
Glucose, Bld: 96 mg/dL (ref 65–99)
POTASSIUM: 4.4 mmol/L (ref 3.5–5.1)
Sodium: 134 mmol/L — ABNORMAL LOW (ref 135–145)
TOTAL PROTEIN: 6 g/dL — AB (ref 6.5–8.1)
Total Bilirubin: 0.6 mg/dL (ref 0.3–1.2)

## 2014-12-05 LAB — CBC WITH DIFFERENTIAL/PLATELET
BASOS ABS: 0 10*3/uL (ref 0.0–0.1)
Basophils Relative: 0 % (ref 0–1)
EOS PCT: 2 % (ref 0–5)
Eosinophils Absolute: 0.2 10*3/uL (ref 0.0–0.7)
HCT: 27.3 % — ABNORMAL LOW (ref 36.0–46.0)
Hemoglobin: 8.5 g/dL — ABNORMAL LOW (ref 12.0–15.0)
LYMPHS PCT: 6 % — AB (ref 12–46)
Lymphs Abs: 0.7 10*3/uL (ref 0.7–4.0)
MCH: 25.8 pg — AB (ref 26.0–34.0)
MCHC: 31.1 g/dL (ref 30.0–36.0)
MCV: 83 fL (ref 78.0–100.0)
MONOS PCT: 8 % (ref 3–12)
Monocytes Absolute: 0.9 10*3/uL (ref 0.1–1.0)
NEUTROS ABS: 9.6 10*3/uL — AB (ref 1.7–7.7)
Neutrophils Relative %: 84 % — ABNORMAL HIGH (ref 43–77)
Platelets: 413 10*3/uL — ABNORMAL HIGH (ref 150–400)
RBC: 3.29 MIL/uL — AB (ref 3.87–5.11)
RDW: 14.8 % (ref 11.5–15.5)
WBC: 11.5 10*3/uL — AB (ref 4.0–10.5)

## 2014-12-06 ENCOUNTER — Ambulatory Visit
Admit: 2014-12-06 | Discharge: 2014-12-06 | Disposition: A | Discharge status: Home / Self Care | Payer: Medicare Other | Attending: Radiation Oncology | Admitting: Radiation Oncology

## 2014-12-06 ENCOUNTER — Ambulatory Visit (HOSPITAL_BASED_OUTPATIENT_CLINIC_OR_DEPARTMENT_OTHER): Payer: Medicare Other | Admitting: Internal Medicine

## 2014-12-06 ENCOUNTER — Encounter: Payer: Self-pay | Admitting: Radiation Oncology

## 2014-12-06 ENCOUNTER — Encounter (HOSPITAL_COMMUNITY): Payer: Self-pay

## 2014-12-06 ENCOUNTER — Telehealth: Payer: Self-pay | Admitting: Internal Medicine

## 2014-12-06 ENCOUNTER — Encounter: Payer: Self-pay | Admitting: Internal Medicine

## 2014-12-06 VITALS — BP 104/45 | HR 74 | Temp 98.7°F | Resp 18 | Ht 68.5 in

## 2014-12-06 VITALS — BP 118/60 | HR 77 | Temp 97.8°F | Resp 20

## 2014-12-06 DIAGNOSIS — C7951 Secondary malignant neoplasm of bone: Secondary | ICD-10-CM

## 2014-12-06 DIAGNOSIS — Z85118 Personal history of other malignant neoplasm of bronchus and lung: Secondary | ICD-10-CM | POA: Diagnosis not present

## 2014-12-06 DIAGNOSIS — C3490 Malignant neoplasm of unspecified part of unspecified bronchus or lung: Secondary | ICD-10-CM | POA: Diagnosis not present

## 2014-12-06 LAB — FACTOR 10 ASSAY: Factor X Activity: 67 % (ref 65–140)

## 2014-12-06 LAB — CHROMOGENIC FACTOR X (DUKE LAB): CHROM XA: 96 % (ref 86–146)

## 2014-12-06 MED ORDER — HYDROCODONE-ACETAMINOPHEN 5-325 MG PO TABS
1.0000 | ORAL_TABLET | Freq: Once | ORAL | Status: AC
  Administered 2014-12-06: 1 via ORAL
  Filled 2014-12-06: qty 1

## 2014-12-06 NOTE — Addendum Note (Signed)
Encounter addended by: Doreen Beam, RN on: 12/06/2014 12:50 PM<BR>     Documentation filed: Notes Section

## 2014-12-06 NOTE — Addendum Note (Signed)
Encounter addended by: Doreen Beam, RN on: 12/06/2014  1:29 PM<BR>     Documentation filed: Medications

## 2014-12-06 NOTE — Telephone Encounter (Signed)
Gave adn prionted appt sched and avs for pt for July

## 2014-12-06 NOTE — Progress Notes (Signed)
Rec'd FMLA paperwork for [name], child of [name]. This is given to the nurse to complete and return.

## 2014-12-06 NOTE — Addendum Note (Signed)
Encounter addended by: Doreen Beam, RN on: 12/06/2014 12:48 PM<BR>     Documentation filed: Notes Section, Dx Association, Inpatient MAR, Orders

## 2014-12-06 NOTE — Progress Notes (Addendum)
wekly rad tx, 1st /147 rt L-spine.patient in w/c, pt education, radiation book, radiaplex given, discussed ways to   Manage symtoms, fati8gue, diarrhea, skin iritation, increase protein in diet, drink plenty water, at [place] [facility], on regular diet no salt,  Pain right hip and leg, swelling in right calf/foot, to get imodium prn for diarrhea,  Unable to stand for weight, poor appetite, fatgued 12:23 PM BP 118/60 mmHg  Pulse 77  Temp(Src) 97.8 F (36.6 C) (Oral)  Resp 20  Wt   Wt Readings from Last 3 Encounters:  11/25/14 154 lb 5.2 oz (70 kg)  11/15/14 145 lb 8 oz (65.998 kg)  10/25/14 144 lb (65.318 kg)

## 2014-12-06 NOTE — Progress Notes (Signed)
Unable to reassess pain ,patient left via w/c with family once pain med and water given 12:50 PM'

## 2014-12-06 NOTE — Progress Notes (Signed)
Hydrocodone/apap 5/'325mg'$  1 tablet given as ordered verbal and written by Dr. Valere Dross, emptied patient's urostomy bag 125cc,yellow amber  Colored, patient has appt with Dr. Julien Nordmann at 315pm, cannot be sen any sooner, per daughter"They are booked up" "I called" infomred patient to keep right leg elevated 12:46 PM

## 2014-12-06 NOTE — Progress Notes (Signed)
Weekly Management Note:  Site: LS spine/ R paraspinal region Current Dose:  250  cGy Projected Dose: 3500  cGy  Narrative: The patient is seen today for routine under treatment assessment. CBCT/MVCT images/port films were reviewed. The chart was reviewed.   She is having worsening distal right lower extremity pain today related to her travel from [place].  Her pain is worse when her leg is dependent.  She is at the [facility] in [place].  She takes hydrocodone/APAP (5/325) as needed.   Physical Examination:  Filed Vitals:   12/06/14 1218  BP: 118/60  Pulse: 77  Temp: 97.8 F (36.6 C)  Resp: 20  .  Weight:  . No change.  She does have 1-2+ distal right lower extremity edema. Impression: Tolerating radiation therapy well, , she is having pain and we will give her hydrocodone/APAP in our clinic today.  Plan: Continue radiation therapy as planned. She will see Dr. Julien Nordmann today.

## 2014-12-06 NOTE — Progress Notes (Signed)
[place] Telephone:([phone]) [phone]   Fax:([phone]) [name] Ann Maricopa, MD  [place] [address]  PRINCIPAL DIAGNOSIS: Stage IV non-small cell lung cancer diagnosed in March 2007, with disease recurrence in June 2016.   PRIOR THERAPY: Status post 6 cycles of systemic chemotherapy with carboplatin and docetaxel. Last dose was given January 16, 2006.   CURRENT THERAPY: Palliative radiotherapy to the large right paraspinous mass with osseous invasion centered at L5 under the care of Dr. Valere Dross.  DISEASE STAGE: Stage IV non-small cell lung cancer diagnosed in March of 2007.  CHEMOTHERAPY INTENT: Palliative  CURRENT # OF CHEMOTHERAPY CYCLES: 0  CURRENT ANTIEMETICS: None  CURRENT SMOKING STATUS: Nonsmoker  ORAL CHEMOTHERAPY AND CONSENT: None  CURRENT BISPHOSPHONATES USE: None  LIVING WILL AND CODE STATUS: Full code  INTERVAL HISTORY: [name] 81 y.o. female returns to the clinic today for hospital followup visit accompanied by her daughter and grandson. The patient was recently discharged from [facility] after being admitted for severe back pain as well as renal insufficiency secondary to right hydronephrosis status post right percutaneous nephrostomy tube placement. During her admission she had MRI of the brain that was unremarkable for metastatic disease to the brain. She also had MRI of the lumbar spine that showed metastatic squamous cell carcinoma with large right paraspinous mass and osseous invasion measuring 7.8 x 7.2 x 11.2 cm. CT-guided core biopsy of the large mass was consistent with metastatic squamous cell carcinoma. The patient is currently a resident of [facility] and facility for rehabilitation. She was seen by Dr. Valere Dross during her hospitalization and she started the first fraction of radiotherapy today. She denied having any significant chest pain, shortness of breath, cough or hemoptysis. The  patient denied having any weight loss or night sweats. She is here today for evaluation and discussion of her treatment options.  MEDICAL HISTORY: Past Medical History  Diagnosis Date  . Hypertension   . Hypercholesterolemia   . CKD (chronic kidney disease)   . Coronary artery disease     a.  LHC (06/04/05): LHC done in [place] with high grade RCA => s/p BMS to RCA;  b.  Nuclear (09/14/09): Lexiscan; Inf infarct with mild peri-infarct ishemia, EF 52%; Low Risk.  [place] Kitchen GERD (gastroesophageal reflux disease)   . Chronic anemia   . Ischemic cardiomyopathy     a. Echo (07/26/13): Mild LVH, EF 35-40%, diff HK, inf AK, Gr 2 DD, Tr AI, mildly dilated Ao root, MAC, mild MR, mild LAE, mod reduced RVSF.  . Non-small cell carcinoma of lung     Stage IV    ALLERGIES:  is allergic to amlodipine; ciprofloxacin; statins; aspirin; cephalexin; hydralazine; iron; sulfonamide derivatives; and penicillins.  MEDICATIONS:  Current Outpatient Prescriptions  Medication Sig Dispense Refill  . HYDROcodone-acetaminophen (NORCO/VICODIN) 5-325 MG per tablet Take 1 tablet by mouth every 6 (six) hours as needed for moderate pain. Max APAP 3gm/24 hrs from all sources 120 tablet 0  . LORazepam (ATIVAN) 1 MG tablet Take one tablet by mouth every 8 hours for anxiety 90 tablet 5   No current facility-administered medications for this visit.    SURGICAL HISTORY:  Past Surgical History  Procedure Laterality Date  . Hematoma evacuation  December 2006    groin  . Appendectomy    . Cataract extraction    . Hemorroidectomy    . Laminectomy      REVIEW OF  SYSTEMS:  A comprehensive review of systems was negative except for: Constitutional: positive for anorexia, fatigue and weight loss Musculoskeletal: positive for back pain and muscle weakness Neurological: positive for weakness   PHYSICAL EXAMINATION: General appearance: alert, cooperative and no distress Head: Normocephalic, without obvious abnormality,  atraumatic Neck: no adenopathy Lymph nodes: Cervical, supraclavicular, and axillary nodes normal. Resp: clear to auscultation bilaterally Cardio: regular rate and rhythm, S1, S2 normal, no murmur, click, rub or gallop GI: soft, non-tender; bowel sounds normal; no masses,  no organomegaly Extremities: extremities normal, atraumatic, no cyanosis or edema  ECOG PERFORMANCE STATUS: 2 - Symptomatic, <50% confined to bed  Blood pressure 104/45, pulse 74, temperature 98.7 F (37.1 C), temperature source Oral, resp. rate 18, height 5' 8.5" (1.74 m), SpO2 96 %.  LABORATORY DATA: Lab Results  Component Value Date   WBC 11.5* 12/05/2014   HGB 8.5* 12/05/2014   HCT 27.3* 12/05/2014   MCV 83.0 12/05/2014   PLT 413* 12/05/2014      Chemistry      Component Value Date/Time   NA 134* 12/05/2014 0440   NA 137 11/15/2014 1445   NA 143 12/23/2011 0830   K 4.4 12/05/2014 0440   K 3.9 11/15/2014 1445   K 4.7 12/23/2011 0830   CL 98* 12/05/2014 0440   CL 104 06/19/2012 0958   CL 101 12/23/2011 0830   CO2 26 12/05/2014 0440   CO2 24 11/15/2014 1445   CO2 27 12/23/2011 0830   BUN 43* 12/05/2014 0440   BUN 54.2* 11/15/2014 1445   BUN 25* 12/23/2011 0830   CREATININE 2.43* 12/05/2014 0440   CREATININE 2.8* 11/15/2014 1445   CREATININE 2.09* 02/01/2014 1545      Component Value Date/Time   CALCIUM 8.3* 12/05/2014 0440   CALCIUM 9.5 11/15/2014 1445   CALCIUM 9.0 12/23/2011 0830   ALKPHOS 87 12/05/2014 0440   ALKPHOS 137 11/15/2014 1445   ALKPHOS 76 12/23/2011 0830   AST 16 12/05/2014 0440   AST 21 11/15/2014 1445   AST 16 12/23/2011 0830   ALT 6* 12/05/2014 0440   ALT 12 11/15/2014 1445   ALT 16 12/23/2011 0830   BILITOT 0.6 12/05/2014 0440   BILITOT 0.68 11/15/2014 1445   BILITOT 0.60 12/23/2011 0830       RADIOGRAPHIC STUDIES: Ct Abdomen Pelvis Wo Contrast  11/24/2014   CLINICAL DATA:  Acute onset of fever and weakness. Right lower quadrant tenderness. Recent CT-guided biopsy  of right paraspinal mass. Initial encounter.  EXAM: CT ABDOMEN AND PELVIS WITHOUT CONTRAST  TECHNIQUE: Multidetector CT imaging of the abdomen and pelvis was performed following the standard protocol without IV contrast.  COMPARISON:  CT of the abdomen and pelvis performed 06/23/2013, and lumbar spine CT performed 11/08/2014  FINDINGS: Mild bibasilar atelectasis is noted. This is somewhat nodular at the left lung base, measuring 1.0 cm. Diffuse coronary artery calcifications are seen.  The liver and spleen are unremarkable in appearance. The patient is status post cholecystectomy, with clips noted at the gallbladder fossa. The pancreas and adrenal glands are unremarkable.  There is mild right-sided hydronephrosis, with prominence of the proximal to mid right ureter. This appears to reflect partial encasement of the mid right ureter by the patient's large right paraspinal mass.  Nonspecific perinephric stranding is noted on the right. A few scattered hypodensities and hyperdensities within the kidneys likely reflect cysts. No renal or ureteral stones are identified. There is mild bilateral renal scarring.  No free fluid is identified. The  small bowel is unremarkable in appearance. The stomach is within normal limits. No acute vascular abnormalities are seen. Diffuse calcification is seen along the abdominal aorta and its branches.  The patient is status post appendectomy. Scattered diverticulosis is noted at the splenic flexure of the colon, and along the descending and sigmoid colon, without evidence of diverticulitis.  The bladder is mildly distended and grossly unremarkable. The patient is status post hysterectomy. No suspicious adnexal masses are seen. No inguinal lymphadenopathy is seen.  There is a large 12.0 x 8.1 x 6.0 cm mass arising along the right side of the lower lumbar spine, with diffuse erosion into vertebral bodies at L4, L5 and S1, obscuring the exiting nerve roots on the right at these levels. No  definite extension into the spinal canal is yet seen. The mass underlies the right psoas muscle and tracks about the distal IVC and right common iliac vein. There is diffuse encasement of the right internal iliac artery. Mass extends inferiorly medial to the right iliacus muscle.  No additional osseous abnormalities are identified.  IMPRESSION: 1. Large 12.0 x 8.1 x 6.0 cm right paraspinal mass is likely mildly increased in size from recent prior CT, with diffuse erosion into the vertebral bodies at L4, L5 and S1, obscuring the exiting nerve roots on the right at these levels. No definite extension into the spinal canal. Mass underlies the right psoas muscle and tracks about the distal SVC and right common iliac vein. Diffuse encasement of the right internal iliac artery. 2. Mild right-sided hydronephrosis, with prominence of the proximal to mid right ureter. This appears to reflect partial encasement of the mid right ureter by the large right paraspinal mass. 3. Nodule at the left lung base, measuring 1.0 cm, of uncertain significance. This is apparently new from 67. Mild underlying bibasilar atelectasis noted. 4. Few scattered bilateral renal cysts. Mild bilateral renal scarring noted. 5. Diffuse calcification along the abdominal aorta and its branches. 6. Scattered diverticulosis of the splenic flexure of the colon, and along the descending and sigmoid colon, without evidence of diverticulitis.   Electronically Signed   By: Garald Balding M.D.   On: 11/24/2014 00:31   Ct Lumbar Spine Wo Contrast  11/08/2014   CLINICAL DATA:  Low back pain for 3 weeks. Two falls in 1 day. Severe right hip and right leg pain. Personal history of stage IV non-small cell lung cancer. Prior chemotherapy. No current therapy.  EXAM: CT LUMBAR SPINE WITHOUT CONTRAST  TECHNIQUE: Multidetector CT imaging of the lumbar spine was performed without intravenous contrast administration. Multiplanar CT image reconstructions were also  generated.  COMPARISON:  Lumbar spine radiographs 10/25/2014. CT abdomen and pelvis 06/23/2013  FINDINGS: Vertebral alignment is unchanged with grade 1 anterolisthesis of L3 on L4 and L4 on L5 and grade 1 retrolisthesis of L5 on S1. There is slight lumbar levoscoliosis. Disc space narrowing is mild at L4-5 and moderate to severe at L5-S1 and with associated vacuum disc phenomenon.  There is a new, large right-sided lumbosacral paravertebral soft tissue mass which measures approximately 6.9 x 7.1 x 10.5 cm. This invades the lateral aspects of the L5 greater than L4 vertebral bodies. There is involvement of the right L5 pedicle and transverse process, and there is also prominent involvement of the right sacral ala and right S1 vertebral body. The mass displaces the right psoas muscle laterally and encases the right internal iliac artery as well as right common, internal and external iliac veins. It also partially  encircles the lower most IVC. There is a pathologic compression fracture of the right half of the L5 vertebral body with mild vertebral body height loss. Tumor extends into the superior aspect of the right L5-S1 neural foramen.  Extensive atherosclerotic vascular calcification is present in the abdomen and pelvis. The kidneys are atrophic with similar appearance of small hypodense renal lesions most likely representing cysts. Cholecystectomy clips are noted.  L1-2: Mild disc bulging without significant stenosis.  L2-3: Mild disc bulging and mild facet and ligamentum flavum hypertrophy result in mild right lateral recess stenosis and mild right and minimal left neural foraminal stenosis without significant spinal stenosis.  L3-4: Listhesis with disc uncovering, ligamentum flavum hypertrophy, and severe bilateral facet arthrosis result in mild to moderate right and minimal left neural foraminal narrowing without significant spinal stenosis.  L4-5: Listhesis with disc uncovering and moderate facet arthrosis  without significant stenosis. The right L4 nerve root is likely involved by tumor lateral to the foramen.  L5-S1: Listhesis with disc uncovering and mild facet arthrosis without significant stenosis. The right L5 nerve root is likely involved by tumor lateral to the foramen.  IMPRESSION: New 11 cm destructive right-sided paravertebral soft tissue mass at the lumbosacral junction, consistent with metastasis. There is osseous involvement at L4, L5, and S1 with pathologic L5 compression fracture. Right-sided iliac arterial and venous encasement as above.  These results were called by telephone at the time of interpretation on 11/08/2014 at 4:01 pm to Dr. Arther Abbott , who verbally acknowledged these results.   Electronically Signed   By: Logan Bores   On: 11/08/2014 16:05   Mr Brain Wo Contrast  11/28/2014   CLINICAL DATA:  Metastatic lung cancer, staging. No reported neurologic symptoms.  EXAM: MRI HEAD WITHOUT CONTRAST  TECHNIQUE: Multiplanar, multiecho pulse sequences of the brain and surrounding structures were obtained without intravenous contrast.  COMPARISON:  CT head 07/28/2014.  FINDINGS: The patient was unable to remain motionless for the exam. Small or subtle lesions could be overlooked.  The patient was unable to receive gadolinium because of poor renal function. Sensitivity in the detection of metastatic disease is reduced.  No evidence for acute infarction, hemorrhage, mass lesion, hydrocephalus, or extra-axial fluid. Extensive atrophy. Severe chronic microvascular ischemic change. Prominent perivascular spaces. Tiny foci chronic hemorrhage LEFT lateral thalamus and RIGHT lentiform nucleus, likely hypertensive in nature.  Flow voids are maintained. Partial empty sella. No tonsillar herniation. Negative orbits. No sinus disease. Sclerotic mastoids.  IMPRESSION: Noncontrast motion degraded examination, demonstrating no definite intracranial metastatic disease.  Chronic changes as described with  atrophy, small vessel disease, and small chronic microbleeds, probably hypertensive related.   Electronically Signed   By: Staci Righter M.D.   On: 11/28/2014 09:27   Mr Lumbar Spine Wo Contrast  11/28/2014   CLINICAL DATA:  Metastatic lung cancer.  RIGHT-sided pain.  EXAM: MRI LUMBAR SPINE WITHOUT CONTRAST  TECHNIQUE: Multiplanar, multisequence MR imaging of the lumbar spine was performed. No intravenous contrast was administered.  COMPARISON:  CT abdomen pelvis 11/23/2014.  FINDINGS: Large RIGHT paraspinous mass with osseous invasion centered at L5 having cross-sectional measurements as seen on series 7, image 38 of 78 x 72 mm. Craniocaudal extent as seen on image 3 series 4 of 112 mm. Significant RIGHT-sided tumor at L5 results in mild endplate softening and early compression. No retropulsion. No similar loss of height at L4 or S1. Minor tumor involvement in S2.  Tumor within the extraforaminal compartment on the RIGHT at L4-5  and L5-S1 likely affecting the RIGHT-sided nerve roots at that location. Tumor also abuts the first sacral nerve root as it exits from the ventral sacrum. No frank epidural tumor.  Vascular encasement of the RIGHT common iliac artery. Vascular displacement of the RIGHT common iliac vein. RIGHT hydronephrosis has been relieved by percutaneous drainage.  There is mild degenerative anterolisthesis L4 on L5 related to facet disease of approximately 2 mm. No significant disc protrusion. Mild stenosis at L4-5 related to slip, facet disease, and annular bulging.  IMPRESSION: Metastatic squamous cell carcinoma with a large RIGHT paraspinous mass and osseous invasion; estimated measurements 78 x 72 x 112 mm.  Within limits of evaluation on noncontrast scan, no definite epidural tumor.  Tumor within the extraforaminal compartment on the RIGHT at L4-5 and L5-S1 as well as along the RIGHT side of the sacrum likely affects the RIGHT L4, L5, and S1 nerve roots.  Mild pathologic endplate compression at  L5 on the RIGHT. No retropulsion.   Electronically Signed   By: Staci Righter M.D.   On: 11/28/2014 10:14   US Venous Img Lower Unilateral Right  11/08/2014   CLINICAL DATA:  Right lower extremity pain and edema. History of smoking. History of lung cancer. Evaluate for DVT.  EXAM: RIGHT LOWER EXTREMITY VENOUS DOPPLER ULTRASOUND  TECHNIQUE: Gray-scale sonography with graded compression, as well as color Doppler and duplex ultrasound were performed to evaluate the lower extremity deep venous systems from the level of the common femoral vein and including the common femoral, femoral, profunda femoral, popliteal and calf veins including the posterior tibial, peroneal and gastrocnemius veins when visible. The superficial great saphenous vein was also interrogated. Spectral Doppler was utilized to evaluate flow at rest and with distal augmentation maneuvers in the common femoral, femoral and popliteal veins.  COMPARISON:  None.  FINDINGS: Contralateral Common Femoral Vein: Respiratory phasicity is normal and symmetric with the symptomatic side. No evidence of thrombus. Normal compressibility.  Common Femoral Vein: No evidence of thrombus. Normal compressibility, respiratory phasicity and response to augmentation.  Saphenofemoral Junction: No evidence of thrombus. Normal compressibility and flow on color Doppler imaging.  Profunda Femoral Vein: No evidence of thrombus. Normal compressibility and flow on color Doppler imaging.  Femoral Vein: No evidence of thrombus. Normal compressibility, respiratory phasicity and response to augmentation.  Popliteal Vein: No evidence of thrombus. Normal compressibility, respiratory phasicity and response to augmentation.  Calf Veins: No evidence of thrombus. Normal compressibility and flow on color Doppler imaging.  Superficial Great Saphenous Vein: No evidence of thrombus. Normal compressibility and flow on color Doppler imaging.  Venous Reflux:  None.  Other Findings: Subcutaneous  edema is noted at the level of the calf and ankle (representative images 38, 39 and 40).  IMPRESSION: No evidence of DVT within the right lower extremity.   Electronically Signed   By: Sandi Mariscal M.D.   On: 11/08/2014 15:41   Ct Biopsy  11/23/2014   INDICATION: History of lung cancer, now with infiltrative right-sided paraspinal mass adjacent to the caudal aspect of the lumbar spine extending to involve the lumbar sacral junction. Please perform CT-guided biopsy for tissue diagnostic purposes  EXAM: CT-GUIDED BIOPSY OF INFILTRATIVE RIGHT PARASPINAL MASS.  COMPARISON:  Lumbar spine CT - 11/08/2014  MEDICATIONS: Fentanyl 25 mcg IV; Versed 1 mg IV  ANESTHESIA/SEDATION: Sedation time  8 minutes  CONTRAST:  None  COMPLICATIONS: None immediate  PROCEDURE: Informed consent was obtained from the patient following an explanation of the procedure, risks, benefits  and alternatives. A time out was performed prior to the initiation of the procedure.  The patient was positioned prone on the CT table and a limited CT was performed for procedural planning demonstrating unchanged appearance of the note expansile paraspinal mass adjacent to the caudal aspect of the lumbar spine measuring at least 5.6 x 7.3 cm in diameter (image 16, series 2). The procedure was planned. The operative site was prepped and draped in the usual sterile fashion. Appropriate trajectory was confirmed with a 22 gauge spinal needle after the adjacent tissues were anesthetized with 1% Lidocaine with epinephrine.  Under intermittent CT guidance, a 17 gauge coaxial needle was advanced into the peripheral aspect of the mass. Appropriate positioning was confirmed and 6 core needle biopsy samples were obtained with an 18 gauge core needle biopsy device. The co-axial needle was removed and hemostasis was achieved with manual compression.  A limited postprocedural CT was negative for hemorrhage or additional complication. A dressing was placed. The patient  tolerated the procedure well without immediate postprocedural complication.  IMPRESSION: Technically successful CT guided core needle biopsy of expansile right-sided paraspinal mass adjacent to the caudal aspect of the lumbar spine.   Electronically Signed   By: Sandi Mariscal M.D.   On: 11/23/2014 10:13   Dg Chest Port 1 View  11/24/2014   CLINICAL DATA:  Fever.  Possible sepsis.  Initial encounter.  EXAM: PORTABLE CHEST - 1 VIEW  COMPARISON:  Chest radiograph performed 07/29/2014, and CT of the chest performed 06/27/2014  FINDINGS: The lungs are well-aerated. Vascular congestion is noted, with mild bibasilar scarring. There is no evidence of pleural effusion or pneumothorax. Known left-sided pulmonary nodules are partially characterized on radiograph.  The cardiomediastinal silhouette is mildly enlarged. No acute osseous abnormalities are seen.  IMPRESSION: 1. Vascular congestion and mild cardiomegaly, with mild bibasilar scarring. No definite evidence of pneumonia. 2. Known left-sided pulmonary nodules are partially characterized on radiograph.   Electronically Signed   By: Garald Balding M.D.   On: 11/24/2014 06:59   Ir Nephrostomy Placement Right  11/24/2014   CLINICAL DATA:  81 year old with a right paraspinal mass, mild right hydronephrosis and urinary infection. Request for percutaneous nephrostomy tube.  EXAM: RIGHT PERCUTANEOUS NEPHROSTOMY TUBE PLACEMENT WITH ULTRASOUND AND FLUOROSCOPIC GUIDANCE  Physician: Stephan Minister. Henn, MD  FLUOROSCOPY TIME:  5 minutes and 6 seconds.  37  mGy  MEDICATIONS: 0.5 mg Versed, 25 mcg fentanyl. A radiology nurse monitored the patient for moderate sedation.  Patient is already receiving antibiotics and additional antibiotics were not given for this procedure.  ANESTHESIA/SEDATION: Moderate sedation time: 26 minutes  PROCEDURE: Informed consent was obtained for a percutaneous nephrostomy tube. Patient was placed prone. The right kidney was identified with ultrasound. The right  flank was prepped and draped in sterile fashion. Maximal barrier sterile technique was utilized including caps, mask, sterile gowns, sterile gloves, sterile drape, hand hygiene and skin antiseptic. Skin was anesthetized with 1% lidocaine. A 22 gauge needle was directed into the right mid/lower pole collecting system with ultrasound guidance. Contrast injection confirmed placement in the renal collecting system. A 0.018 wire was advanced. An Accustick dilator set was placed. Tract was dilated to accommodate a 10.2 [place] multipurpose drain. The catheter was reconstituted in the renal pelvis. Contrast injection confirmed appropriate positioning. Catheter was attached to a gravity bag and sutured in place. Bandage was placed at the catheter site. Fluoroscopic and ultrasound images were taken and saved for documentation.  FINDINGS: Mild dilatation of the  right renal collecting system. Access was obtained from the mid/lower pole. Small amount of contrast was draining distal to the obstructing lesion.  Estimated blood loss: Minimal  COMPLICATIONS: None  IMPRESSION: Successful placement of a right percutaneous nephrostomy tube with ultrasound and fluoroscopic guidance.   Electronically Signed   By: Markus Daft M.D.   On: 11/24/2014 17:11    ASSESSMENT AND PLAN: This is a very pleasant 81 years old white female with history of stage IV non-small cell lung cancer diagnosed in March of 2007 status post 6 cycles of systemic chemotherapy with carboplatin and docetaxel and has been observation since that time with no evidence for disease progression for almost 9 years.  She was found recently to have evidence for disease recurrence with large lumbar paraspinous mass with osseous invasion and the core biopsy was consistent with metastatic squamous cell carcinoma.  I recommended for the patient to continue with the palliative radiotherapy to the large mass as scheduled by Dr. Valere Dross. She will continue with her current pain  medication. The patient would come back for follow-up visit in 3 weeks for evaluation and discussion of her systemic treatment options after completion of the palliative radiotherapy. I will reschedule her PET scan to be performed next week. The patient was advised to call immediately if she has any concerning symptoms in the interval.  All questions were answered. The patient knows to call the clinic with any problems, questions or concerns. We can certainly see the patient much sooner if necessary.  Disclaimer: This note was dictated with voice recognition software. Similar sounding words can inadvertently be transcribed and may not be corrected upon review.

## 2014-12-07 ENCOUNTER — Encounter: Payer: Self-pay | Admitting: *Deleted

## 2014-12-07 ENCOUNTER — Ambulatory Visit
Admit: 2014-12-07 | Discharge: 2014-12-07 | Disposition: A | Discharge status: Home / Self Care | Payer: Medicare Other | Attending: Radiation Oncology | Admitting: Radiation Oncology

## 2014-12-07 DIAGNOSIS — C3490 Malignant neoplasm of unspecified part of unspecified bronchus or lung: Secondary | ICD-10-CM | POA: Diagnosis not present

## 2014-12-08 ENCOUNTER — Encounter: Payer: Self-pay | Admitting: Radiation Oncology

## 2014-12-08 ENCOUNTER — Ambulatory Visit
Admit: 2014-12-08 | Discharge: 2014-12-08 | Disposition: A | Discharge status: Home / Self Care | Payer: Medicare Other | Attending: Radiation Oncology | Admitting: Radiation Oncology

## 2014-12-08 DIAGNOSIS — C3490 Malignant neoplasm of unspecified part of unspecified bronchus or lung: Secondary | ICD-10-CM | POA: Diagnosis not present

## 2014-12-08 NOTE — Progress Notes (Addendum)
Patient ID: [name], female   DOB: [birth year], 81 y.o.   MRN: [id number]                PROGRESS NOTE  DATE:  12/05/2014            FACILITY: [facility]                       LEVEL OF CARE:   SNF   Acute Visit                   CHIEF COMPLAINT:  Follow up albumin of 2.1.       HISTORY OF PRESENT ILLNESS:  This is an unfortunate woman who has a large malignant mass in the right paraspinous area at L4-L5, L5/S1.  She has weakness in the right leg.  The tumor also encases and displaces her right common iliac vein.  Biopsy of this showed metastatic squamous cell carcinoma, presumably from lung, dating back several years ago.  She has a nephrostomy tube related to obstruction of her ureter.  This is on the right, also.    LABORATORY DATA:   Her lab work from today shows:    Sodium 134.  BUN 43, creatinine 2.43.  This is stable to improved from the hospital.       Albumin level 2.1.    Liver function tests normal.    I think her corrected calcium levels would be within the normal range.    White count 11.5, hemoglobin 8.5, platelet count 413.    REVIEW OF SYSTEMS:    CHEST/RESPIRATORY:  No shortness of breath.   CARDIAC:  No chest pain.   GI:  She does not complain of nausea, vomiting, or abdominal pain.  Said she was given something for constipation.  Now, she has had several large incontinent stools.  I will check with the nurses about this.  I do not believe she is having diarrhea although I would want to clarify that, as well.   MUSCULOSKELETAL:  Having pain in the right knee going down her leg.  She tells me that she has had in the past a significant history of gout, although I do not see this on her problem list.  In fact, she avoids red meat because of this.    PHYSICAL EXAMINATION:   GENERAL APPEARANCE:  The patient does not look unwell.   GASTROINTESTINAL:   LIVER/SPLEEN/KIDNEYS:  No liver, no spleen.  No tenderness.   MUSCULOSKELETAL:   EXTREMITIES:     RIGHT LOWER EXTREMITY:  There is fluid on her right knee with some warmth.    ASSESSMENT/PLAN:                      Right knee effusion and pain.  Some of her pain could be radicular.  I wonder whether this is gout or pseudogout, for that matter.  Consider apsiration  Apparent diarrhea which may be caused by MiraLAX, of which she is taking 17 g a day.  I will change this to p.r.n.    Profound hypoalbuminemia, which is probably nutritionally-related.  I have discussed this with her.  Her daughter reports she is not eating.    History of a DVT, although I was not able to see documentation of this.  She has large areas of tumor around her pelvic vessels.  She remains on Lovenox 80 mg a day.

## 2014-12-08 NOTE — Progress Notes (Signed)
Received FMLA paperwork back from Dr. Valere Dross. I have faxed information to the [employer] on behalf of daughter, [name]. I will be giving originals back to the patient and copies are scanned into Epic.

## 2014-12-09 ENCOUNTER — Ambulatory Visit
Admit: 2014-12-09 | Discharge: 2014-12-09 | Disposition: A | Discharge status: Home / Self Care | Payer: Medicare Other | Attending: Radiation Oncology | Admitting: Radiation Oncology

## 2014-12-09 DIAGNOSIS — C3490 Malignant neoplasm of unspecified part of unspecified bronchus or lung: Secondary | ICD-10-CM | POA: Diagnosis not present

## 2014-12-12 ENCOUNTER — Ambulatory Visit
Admission: RE | Admit: 2014-12-12 | Discharge: 2014-12-12 | Disposition: A | Discharge status: Home / Self Care | Payer: Medicare Other | Source: Ambulatory Visit | Attending: Radiation Oncology | Admitting: Radiation Oncology

## 2014-12-12 ENCOUNTER — Ambulatory Visit
Admit: 2014-12-12 | Discharge: 2014-12-12 | Disposition: A | Discharge status: Home / Self Care | Payer: Medicare Other | Attending: Radiation Oncology | Admitting: Radiation Oncology

## 2014-12-12 ENCOUNTER — Encounter: Payer: Self-pay | Admitting: Radiation Oncology

## 2014-12-12 VITALS — BP 97/49 | HR 64 | Temp 98.6°F | Ht 68.5 in

## 2014-12-12 DIAGNOSIS — C3490 Malignant neoplasm of unspecified part of unspecified bronchus or lung: Secondary | ICD-10-CM | POA: Diagnosis not present

## 2014-12-12 DIAGNOSIS — R609 Edema, unspecified: Secondary | ICD-10-CM | POA: Diagnosis not present

## 2014-12-12 DIAGNOSIS — C7951 Secondary malignant neoplasm of bone: Secondary | ICD-10-CM | POA: Insufficient documentation

## 2014-12-12 DIAGNOSIS — Z510 Encounter for antineoplastic radiation therapy: Secondary | ICD-10-CM | POA: Diagnosis not present

## 2014-12-12 LAB — CBC WITH DIFFERENTIAL/PLATELET
Basophils Absolute: 0 10*3/uL (ref 0.0–0.1)
Basophils Relative: 0 % (ref 0–1)
EOS ABS: 0.2 10*3/uL (ref 0.0–0.7)
EOS PCT: 2 % (ref 0–5)
HCT: 22.3 % — ABNORMAL LOW (ref 36.0–46.0)
Hemoglobin: 7.6 g/dL — ABNORMAL LOW (ref 12.0–15.0)
LYMPHS ABS: 0.5 10*3/uL — AB (ref 0.7–4.0)
LYMPHS PCT: 7 % — AB (ref 12–46)
MCH: 27.5 pg (ref 26.0–34.0)
MCHC: 34.1 g/dL (ref 30.0–36.0)
MCV: 80.8 fL (ref 78.0–100.0)
Monocytes Absolute: 0.7 10*3/uL (ref 0.1–1.0)
Monocytes Relative: 9 % (ref 3–12)
Neutro Abs: 6.1 10*3/uL (ref 1.7–7.7)
Neutrophils Relative %: 82 % — ABNORMAL HIGH (ref 43–77)
Platelets: 292 10*3/uL (ref 150–400)
RBC: 2.76 MIL/uL — ABNORMAL LOW (ref 3.87–5.11)
RDW: 14.5 % (ref 11.5–15.5)
WBC: 7.4 10*3/uL (ref 4.0–10.5)

## 2014-12-12 LAB — BASIC METABOLIC PANEL
ANION GAP: 11 (ref 5–15)
BUN: 76 mg/dL — ABNORMAL HIGH (ref 6–20)
CALCIUM: 8.1 mg/dL — AB (ref 8.9–10.3)
CHLORIDE: 96 mmol/L — AB (ref 101–111)
CO2: 26 mmol/L (ref 22–32)
CREATININE: 2.72 mg/dL — AB (ref 0.44–1.00)
GFR, EST AFRICAN AMERICAN: 18 mL/min — AB (ref >60)
GFR, EST NON AFRICAN AMERICAN: 16 mL/min — AB (ref >60)
GLUCOSE: 116 mg/dL — AB (ref 65–99)
Potassium: 4.7 mmol/L (ref 3.5–5.1)
Sodium: 133 mmol/L — ABNORMAL LOW (ref 135–145)

## 2014-12-12 LAB — CK: CK TOTAL: 7 U/L — AB (ref 38–234)

## 2014-12-12 LAB — SEDIMENTATION RATE: Sed Rate: 114 mm/hr — ABNORMAL HIGH (ref 0–22)

## 2014-12-12 MED ORDER — HYDROCODONE-ACETAMINOPHEN 5-325 MG PO TABS
1.0000 | ORAL_TABLET | Freq: Once | ORAL | Status: AC
  Administered 2014-12-12: 1 via ORAL
  Filled 2014-12-12: qty 1

## 2014-12-12 NOTE — Progress Notes (Signed)
Given Hydrocodone 5/'325mg'$  po as ordered by Dr. Eppie Gibson at 12:35pm for level 3/10 pain.  Patient has a~ 40-50 min hour car ride to home.  Left for home with family after medication given.

## 2014-12-12 NOTE — Progress Notes (Signed)
Ms. Ord has received 5 fractions to her R L Spine.  Up to wheelchair. Unable to stand to weigh.  Edema of feet.  Grades pain in her right leg and foot as a level 3/10.

## 2014-12-12 NOTE — Progress Notes (Signed)
   Weekly Management Note:  Outpatient    ICD-9-CM ICD-10-CM   1. Metastatic squamous cell carcinoma to bone 198.5 C79.51     Current Dose:  12.5 Gy  Projected Dose: 35 Gy   Narrative:  The patient presents for routine under treatment assessment.  CBCT/MVCT images/Port film x-rays were reviewed.  The chart was checked.c/o continued Right foot drop, right leg pain, DVT (on Lovenox blood thinner). No current back pain. Requests hydrocodone pill.  Physical Findings:  height is 5' 8.5" (1.74 m). Her temperature is 98.6 F (37 C). Her blood pressure is 97/49 and her pulse is 64. Her oxygen saturation is 98%.   Wt Readings from Last 3 Encounters:  11/25/14 154 lb 5.2 oz (70 kg)  11/15/14 145 lb 8 oz (65.998 kg)  10/25/14 144 lb (65.318 kg)   NAD, RLE edema, in Wheelchair.  Impression:  The patient is tolerating radiotherapy.  Plan:  Continue radiotherapy as planned.  Hydrocodone/Acet 5/325 given before ride home  ________________________________   Eppie Gibson, M.D.

## 2014-12-13 ENCOUNTER — Non-Acute Institutional Stay (SKILLED_NURSING_FACILITY): Payer: Medicare Other | Admitting: Internal Medicine

## 2014-12-13 ENCOUNTER — Ambulatory Visit (INDEPENDENT_AMBULATORY_CARE_PROVIDER_SITE_OTHER): Payer: Medicare Other | Admitting: Urology

## 2014-12-13 ENCOUNTER — Encounter: Payer: Self-pay | Admitting: Internal Medicine

## 2014-12-13 ENCOUNTER — Ambulatory Visit
Admit: 2014-12-13 | Discharge: 2014-12-13 | Disposition: A | Discharge status: Home / Self Care | Payer: Medicare Other | Attending: Radiation Oncology | Admitting: Radiation Oncology

## 2014-12-13 DIAGNOSIS — N183 Chronic kidney disease, stage 3 unspecified: Secondary | ICD-10-CM

## 2014-12-13 DIAGNOSIS — K1379 Other lesions of oral mucosa: Secondary | ICD-10-CM

## 2014-12-13 DIAGNOSIS — C7951 Secondary malignant neoplasm of bone: Secondary | ICD-10-CM | POA: Diagnosis not present

## 2014-12-13 DIAGNOSIS — I10 Essential (primary) hypertension: Secondary | ICD-10-CM | POA: Diagnosis not present

## 2014-12-13 DIAGNOSIS — R1900 Intra-abdominal and pelvic swelling, mass and lump, unspecified site: Secondary | ICD-10-CM | POA: Diagnosis not present

## 2014-12-13 DIAGNOSIS — C3490 Malignant neoplasm of unspecified part of unspecified bronchus or lung: Secondary | ICD-10-CM | POA: Diagnosis not present

## 2014-12-13 DIAGNOSIS — N1330 Unspecified hydronephrosis: Secondary | ICD-10-CM

## 2014-12-13 DIAGNOSIS — D638 Anemia in other chronic diseases classified elsewhere: Secondary | ICD-10-CM

## 2014-12-13 NOTE — Progress Notes (Signed)
Patient ID: [name], female   DOB: [birth year], 81 y.o.   MRN: [id number]   Facility; [facility] This is an acute visit  Chief complaint--acute visit follow-up renal insufficiency-anemia-mouth discomfort  HPI-- this is a 81 year old woman who has a history of lung cancer 9 years ago treated with chemotherapy only she did not have surgery or radiation. She did well up until recently. She started to develop right hip pain.   Further evaluation showed a large right para's final mass encasing the right ureter and causing hydronephrosis. She was also found to have a right lower extremity DVT. She underwent placement of a right percutaneous nephrostomy. Biopsy of the mass was done and shows squamous cell carcinoma she then developed a Proteus UTI and bacteremia. She was treated with IV Primaxin which completed on 6/28.  I  the lung cancer was initially stage IV her biopsy shows metastatic squamous cell carcinoma she is seen Dr. Valere Dross of radiation and she has started radiation treatments. The paraspinal mass is affecting right L4-L5 and S1 nerve roots.  Apparently the DVT was bilateral. The patient was initially on IV heparin to Coumadin however the recommendation from oncology was Lovenox with follow-up factor X A levels. Lovenox was recently increased on to 80 mg on 6/28 from 70 mg  She does have a history of chronic kidney disease lab done yesterday shows her creatinine is 2.72 BUN of 76 this appears to be relatively baseline I do see previous creatinines ranging from 2.43 up to 2.8.  She also has a history of anemia most likely chronic her hemoglobin is 7.6 this is slightly down from her baseline which seems to be in the eights.  Clinically she says she feels okay she is weak but does not complain of increased weakness from baseline syncopal-type feelings palpitations or chest pain.      Past Medical History  Diagnosis Date  . Hypertension   . Hypercholesterolemia   . CKD (chronic  kidney disease)   . Coronary artery disease     a.  LHC (06/04/05): LHC done in [place] with high grade RCA => s/p BMS to RCA;  b.  Nuclear (09/14/09): Lexiscan; Inf infarct with mild peri-infarct ishemia, EF 52%; Low Risk.  Marland Kitchen GERD (gastroesophageal reflux disease)   . Chronic anemia   . Ischemic cardiomyopathy     a. Echo (07/26/13): Mild LVH, EF 35-40%, diff HK, inf AK, Gr 2 DD, Tr AI, mildly dilated Ao root, MAC, mild MR, mild LAE, mod reduced RVSF.  . Non-small cell carcinoma of lung     Stage IV    Past Surgical History  Procedure Laterality Date  . Hematoma evacuation  December 2006    groin  . Appendectomy    . Cataract extraction    . Hemorroidectomy    . Laminectomy      Current Outpatient Prescriptions on File Prior to Visit  Medication Sig Dispense Refill  . cloNIDine (CATAPRES) 0.2 MG tablet Take 1 tablet (0.2 mg total) by mouth 3 (three) times daily.    Marland Kitchen enoxaparin (LOVENOX) 80 MG/0.8ML injection Inject 0.8 mLs (80 mg total) into the skin daily.    . ertapenem 0.5 g in sodium chloride 0.9 % 50 mL Inject 0.5 g into the vein daily. 8 more days from 11/29/14-and then discontinue. Please remove PICC line when antibiotics are discontinued.    . furosemide (LASIX) 40 MG tablet Take 1 tablet (40 mg total) by mouth daily. 90 tablet 3  .  HYDROcodone-acetaminophen (NORCO/VICODIN) 5-325 MG per tablet Take 1 tablet by mouth every 6 (six) hours as needed for moderate pain. 30 tablet 0  . loperamide (IMODIUM A-D) 2 MG tablet Take 2 mg by mouth 4 (four) times daily as needed for diarrhea or loose stools (diarrhea).    . LORazepam (ATIVAN) 1 MG tablet Take one tablet by mouth every 8 hours for anxiety 90 tablet 5  . metoprolol succinate (TOPROL-XL) 25 MG 24 hr tablet Take 1 tablet (25 mg total) by mouth daily.    . pantoprazole (PROTONIX) 40 MG tablet TAKE ONE TABLET BY MOUTH ONCE DAILY. 90 tablet 1  . polyethylene glycol (MIRALAX / GLYCOLAX) packet Take 17 g by mouth daily.       Social; the patient lives in [place]. Patient's daughter is nearby. The patient was independent the with ADLs and IDL septal a short time ago.  reports that she has quit smoking. Her smoking use included Cigarettes. She does not have any smokeless tobacco history on file. She reports that she does not drink alcohol.  family history includes Heart failure in her mother; Lung cancer in her brother and sister; Stomach cancer in her brother. There is no history of Heart attack or Stroke.  Review of systems Gen. no complaints except she says her mouth feels sore  Head ears eyes nose mouth and throat-as noted other than complaining of mouth discomfort does not complain of visual changes Respiratory no shortness of breath Cardiac no chest pain Abdomen no abdominal pain no diarrhea  Musculoskeletal right hip and low back pain  Which appears to be stabilized she does receive Norco Neurologic does not complain of dizziness headache or syncopal-type feelings.  Psych does not complaining of anxiety or depression  Physical examination  Temperature 98.1 pulse 68 respirations 20 blood pressure 97/75-122/43-109/53 most recently--I obtained 118/48 manually Gen. patient is awake conversational alert Her skin is warm and dry Mouth-her tongue appears to be somewhat reddish irritated looking-otherwise oropharynx is clear nursing reports occasional film on the tongue although I do not really see that today this will have to be monitored Mucous membranes are moist Respiratory clear entry bilaterally Cardiac regular rate and rhythm l no murmurs Abdomen no liver no spleen no tenderness no masses. Extremities; t Has some mild edema this is greater on the right versus the left  appears this has improved.  . Neurologic; Has weakness right lower extremity which is not new cranial nerves are intact her speech is clear.  Psych she is alert and oriented pleasant and  appropriate.  Labs.  12/12/2014.  Sodium 133 potassium 4.7 BUN 76 creatinine 2.72.  WBC 7.4 hemoglobin 7.6 platelets 292  Impression/plan  #1 anemia most likely chronic renal disease hemoglobin is 7.6 which is a bit under her baseline-will have oncology notified-clinically she appears to be stable I do note her platelets are within normal range at 292.  #2 history of chronic kidney disease this appears to be relatively baseline with a creatinine of 2.72 at this point will monitor.  Number #3 extensive right paraspinal mass involving several nerve roots and her right ureter  . Biopsy of the mass shows squamous cell carcinoma.  She has started radiation treatment appears to be tolerating this fairly well  #4-mouth discomfort-one would wonder if this is possibly side effect of the radiation-will treat with Magic mouthwash 4 times a day for 5 days to see if this will give some relief  --#5 hypertension-this appears to be stable  she is on clonidine 0.2 mg 3 times a day as well as Toprol-XL 25 mg daily I see systolics largely running in the lower 100s generally---   [id number]    c.

## 2014-12-14 ENCOUNTER — Ambulatory Visit
Admit: 2014-12-14 | Discharge: 2014-12-14 | Disposition: A | Discharge status: Home / Self Care | Payer: Medicare Other | Attending: Radiation Oncology | Admitting: Radiation Oncology

## 2014-12-14 ENCOUNTER — Non-Acute Institutional Stay (SKILLED_NURSING_FACILITY): Payer: Medicare Other | Admitting: Internal Medicine

## 2014-12-14 DIAGNOSIS — I82401 Acute embolism and thrombosis of unspecified deep veins of right lower extremity: Secondary | ICD-10-CM

## 2014-12-14 DIAGNOSIS — C7951 Secondary malignant neoplasm of bone: Secondary | ICD-10-CM | POA: Diagnosis not present

## 2014-12-14 DIAGNOSIS — E46 Unspecified protein-calorie malnutrition: Secondary | ICD-10-CM | POA: Diagnosis not present

## 2014-12-14 DIAGNOSIS — G55 Nerve root and plexus compressions in diseases classified elsewhere: Secondary | ICD-10-CM

## 2014-12-14 DIAGNOSIS — G549 Nerve root and plexus disorder, unspecified: Secondary | ICD-10-CM

## 2014-12-14 DIAGNOSIS — E8809 Other disorders of plasma-protein metabolism, not elsewhere classified: Secondary | ICD-10-CM

## 2014-12-14 DIAGNOSIS — D638 Anemia in other chronic diseases classified elsewhere: Secondary | ICD-10-CM

## 2014-12-14 DIAGNOSIS — C3490 Malignant neoplasm of unspecified part of unspecified bronchus or lung: Secondary | ICD-10-CM | POA: Diagnosis not present

## 2014-12-15 ENCOUNTER — Ambulatory Visit
Admit: 2014-12-15 | Discharge: 2014-12-15 | Disposition: A | Discharge status: Home / Self Care | Payer: Medicare Other | Attending: Radiation Oncology | Admitting: Radiation Oncology

## 2014-12-15 DIAGNOSIS — C3490 Malignant neoplasm of unspecified part of unspecified bronchus or lung: Secondary | ICD-10-CM | POA: Diagnosis not present

## 2014-12-16 ENCOUNTER — Ambulatory Visit
Admit: 2014-12-16 | Discharge: 2014-12-16 | Disposition: A | Discharge status: Home / Self Care | Payer: Medicare Other | Attending: Radiation Oncology | Admitting: Radiation Oncology

## 2014-12-16 DIAGNOSIS — C3490 Malignant neoplasm of unspecified part of unspecified bronchus or lung: Secondary | ICD-10-CM | POA: Diagnosis not present

## 2014-12-19 ENCOUNTER — Ambulatory Visit
Admit: 2014-12-19 | Discharge: 2014-12-19 | Disposition: A | Discharge status: Home / Self Care | Payer: Medicare Other | Attending: Radiation Oncology | Admitting: Radiation Oncology

## 2014-12-19 ENCOUNTER — Non-Acute Institutional Stay: Payer: Medicare Other | Admitting: Internal Medicine

## 2014-12-19 ENCOUNTER — Encounter: Payer: Self-pay | Admitting: Radiation Oncology

## 2014-12-19 VITALS — BP 115/42 | HR 85 | Temp 98.2°F | Ht 68.5 in | Wt 146.0 lb

## 2014-12-19 DIAGNOSIS — IMO0002 Reserved for concepts with insufficient information to code with codable children: Secondary | ICD-10-CM

## 2014-12-19 DIAGNOSIS — N183 Chronic kidney disease, stage 3 unspecified: Secondary | ICD-10-CM

## 2014-12-19 DIAGNOSIS — C801 Malignant (primary) neoplasm, unspecified: Secondary | ICD-10-CM | POA: Diagnosis not present

## 2014-12-19 DIAGNOSIS — E43 Unspecified severe protein-calorie malnutrition: Secondary | ICD-10-CM | POA: Diagnosis not present

## 2014-12-19 DIAGNOSIS — I82401 Acute embolism and thrombosis of unspecified deep veins of right lower extremity: Secondary | ICD-10-CM | POA: Diagnosis not present

## 2014-12-19 DIAGNOSIS — C799 Secondary malignant neoplasm of unspecified site: Secondary | ICD-10-CM

## 2014-12-19 DIAGNOSIS — C7951 Secondary malignant neoplasm of bone: Secondary | ICD-10-CM

## 2014-12-19 DIAGNOSIS — C3490 Malignant neoplasm of unspecified part of unspecified bronchus or lung: Secondary | ICD-10-CM | POA: Diagnosis not present

## 2014-12-19 LAB — COMPREHENSIVE METABOLIC PANEL
ALBUMIN: 2 g/dL — AB (ref 3.5–5.0)
ALK PHOS: 91 U/L (ref 38–126)
ALT: 7 U/L — ABNORMAL LOW (ref 14–54)
ANION GAP: 13 (ref 5–15)
AST: 11 U/L — ABNORMAL LOW (ref 15–41)
BUN: 86 mg/dL — ABNORMAL HIGH (ref 6–20)
CO2: 24 mmol/L (ref 22–32)
Calcium: 8.6 mg/dL — ABNORMAL LOW (ref 8.9–10.3)
Chloride: 101 mmol/L (ref 101–111)
Creatinine, Ser: 2.92 mg/dL — ABNORMAL HIGH (ref 0.44–1.00)
GFR calc Af Amer: 17 mL/min — ABNORMAL LOW (ref >60)
GFR calc non Af Amer: 14 mL/min — ABNORMAL LOW (ref >60)
Glucose, Bld: 109 mg/dL — ABNORMAL HIGH (ref 65–99)
Potassium: 4 mmol/L (ref 3.5–5.1)
SODIUM: 138 mmol/L (ref 135–145)
TOTAL PROTEIN: 6.7 g/dL (ref 6.5–8.1)
Total Bilirubin: 0.6 mg/dL (ref 0.3–1.2)

## 2014-12-19 LAB — CBC
HCT: 24.2 % — ABNORMAL LOW (ref 36.0–46.0)
HEMOGLOBIN: 7.7 g/dL — AB (ref 12.0–15.0)
MCH: 25.8 pg — AB (ref 26.0–34.0)
MCHC: 31.8 g/dL (ref 30.0–36.0)
MCV: 81.2 fL (ref 78.0–100.0)
Platelets: 445 10*3/uL — ABNORMAL HIGH (ref 150–400)
RBC: 2.98 MIL/uL — AB (ref 3.87–5.11)
RDW: 14.9 % (ref 11.5–15.5)
WBC: 6.2 10*3/uL (ref 4.0–10.5)

## 2014-12-19 NOTE — Progress Notes (Signed)
Weekly Management Note:  Site: Lumbar spine/right paraspinal region Current Dose:  2500  cGy Projected Dose: 3500  cGy  Narrative: The patient is seen today for routine under treatment assessment. CBCT/MVCT images/port films were reviewed. The chart was reviewed.   She states that her pain is improved.  She is taking one hydrocodone tablet every 6 hours.  She is much more alert.  She is entertaining chemotherapy following completion of radiation therapy.  She is being discharged home from the [facility] today.  Physical Examination:  Filed Vitals:   12/19/14 1355  BP: 115/42  Pulse: 85  Temp: 98.2 F (36.8 C)  .  Weight: 146 lb (66.225 kg).  On inspection of the right lower back she does have a nephrostomy tube with some surrounding erythema related to adhesive bandages but not radiation therapy.  Neurologic examination is unchanged.  Impression: Tolerating radiation therapy well.  She will finish her radiation therapy this Friday.  Plan: Continue radiation therapy as planned.  One-month follow-up visit after completion of radiation therapy.

## 2014-12-19 NOTE — Progress Notes (Signed)
Patient ID: [name], female   DOB: 07-31-1933, 81 y.o.   MRN: [id number] Facility; [facility] Chief complaint; predischarge review History; [name] is a 81 year old woman who was discovered to have a large malignant mass in the right paraspinous area at level LIV-L5 L5-S1. So encased and displaced her right common iliac vein. Biopsy showed metastatic squamous cell carcinoma. She had a nephrostomy tube placed to related to obstruction of her ureter. He is been going for daily radiation treatments I've as directed by Dr. Adela Lank. I don't believe there is been any further imaging tests, probably waiting for completion of her radiation. I believe she only has 5 more treatments. However the edema in her right leg is totally resolved. She also has developed some improvement in neurologic function. She has antigravity dorsi and plantar flexion at the ankle but she does not have antigravity hip flexor. She has developed some sensation in the leg as well.  From our point of view the major issue here continues to be progressive protein calorie malnutrition. At the best of times she eats 25-50% according to the nurses looks after her during the day. Most of the time it is much less than this. Lab work from 12/19/14 shows a BUN of 86 a creatinine of 2.96 [slightly worse] as well as a hemoglobin of 7.7 white count of 6.2 her platelets are 445  She will be going home to her home. She already has a wheelchair. They are building a ramp. She is a 2 person assist transfer bed to chair. Did not need a [employer] fast streamer. They do apparently need a hospital bed which seems perfectly reasonable to me  CBC Latest Ref Rng 12/19/2014 12/12/2014 12/05/2014  WBC 4.0 - 10.5 K/uL 6.2 7.4 11.5(H)  Hemoglobin 12.0 - 15.0 g/dL 7.7(L) 7.6(L) 8.5(L)  Hematocrit 36.0 - 46.0 % 24.2(L) 22.3(L) 27.3(L)  Platelets 150 - 400 K/uL 445(H) 292 413(H)    Lab Results  Component Value Date   CREATININE 2.92* 12/19/2014   CREATININE  2.72* 12/12/2014   CREATININE 2.43* 12/05/2014    Current medications Catapres 0.23 times a day at 9/2/9 Lasix 40 mg a day MiraLAX 17 g daily Norco 5/325 every 6 when necessary Protonix 40 daily Toprol-XL 25 daily Lovenox 80 mg subcutaneously daily  Review of systems Respiratory; patient does not complain of shortness of breath Cardiac no chest pain GI she has not been complaining of this phage a nor does she really complain of anorexia or abdominal pain. In fact she states she eats well's never really excepted the thing else. GU she has a nephrostomy in place  Physical examination Gen. the patient looks as though she has lost weight Vitals; the patient is last 6 pounds since her arrival here. 129/52, pulse 62 respirations 20 temperature 98.1 Respiratory shallow but otherwise clear entry. Cardiac; she looks perhaps mildly dehydrated. Heart sounds are otherwise normal Abdomen; not distended no liver no spleen no masses no tenderness. GU nephrostomy tube in place. Bladder not distended Skin; she had stage I pressure ulcers on her heels that have resolved. She has wound over her coccyx area with some surrounding erythema. I gave her some doxycycline on 7/13 out of concern for sialitis around the wound although I think most of this is a contact dermatitis from the foam-based dressing. Otherwise she is receiving Santyl  Impression/plan #1 chronic renal failure which is slightly worse. I'm going to reduce the Lasix to 20 mg a day. #2 severe protein  calorie malnutrition with a marginal oral intake. I had really thought that she would lost more weight than what our records show area nevertheless I think this is likely to become a problem onto itself. Her daughter asked me today about the the pluses and minuses of a feeding tube. I think a proper discussion of this probably should be left to Dr. Earlie Server who I'm sure we'll have a better overall idea of the prognosis here from a purely oncologic  point of view. #3 history of venous thromboembolism. With DVTs during her last hospitalization. She will likely remain on Lovenox indefinitely. #4 I think she is likely had some response to radiation to the tumor. She has return of neurologic function albeit marginally. #5 unstageable pressure area over her coccyx. I think this or go home with a Santyl-based dressing and something else other than a foam cover  She will need home health PT OT and  I think the hospital bed is indicated. They already have a wheelchair. She has a nephrostomy tube in place.  Code this visit [id number] well over 30 minutes spent

## 2014-12-19 NOTE — Progress Notes (Addendum)
Patient ID: [name], female   DOB: 1933/12/08, 81 y.o.   MRN: [id number]                PROGRESS NOTE  DATE:  12/14/2014         FACILITY: [facility]                 LEVEL OF CARE:   SNF   Acute Visit             CHIEF COMPLAINT:  Follow up medical issues including severe protein calorie malnutrition, anemia.      HISTORY OF PRESENT ILLNESS:  This is a patient who is currently undergoing radiation treatment for a large right-sided lumbosacral paravertebral soft tissue mass which, on initial CT scan of the lumbar spine, measured 6.9 x 7.1 x 10.5.  This had neurologic involvement of several nerve roots including L5/S1.  She also had severe bilateral facet arthrosis at L3-L4 and the L4 nerve root was probably also involved by tumor.  This is felt to be metastatic squamous cell carcinoma from previous lung cancer.  This was proven by a biopsy.    Other issues include the following:    She is eating less than 25% of any meal.  This apparently even includes tremendous assistance at mealtimes from family.   On 12/05/2014, her albumin was 2.1 and I suspect she has been working on this since.  Her liver function tests are normal.  I have discussed this with her.  She is certainly not a candidate for Megace given her history of tumor, DVTs bilaterally.    History of bilateral DVTs.  When I first looked at this patient, I looked over her [facility] records and did not see any documented lower extremity venous thrombosis.  Her venous Doppler ultrasound on 11/24/2014 showed acute thrombus at the right saphenofemoral junction, acute thrombus at the left profundus femoral junction, acute thrombus in the left posterior tibial and left peroneal veins.  This is obviously the reason that she was on ongoing Lovenox.  She also had tumor, I think, encasing her abdominal veins.  In any case, this is probably an indication for ongoing longstanding Lovenox, even if and when she leaves the facility.      Anemia.  Her last hemoglobin on 12/12/2014 was 7.6.  Recent hemoglobins have been in the mid 8 range.  On 11/23/2014, she was 9.8.  This was down from 12.6 earlier this year.    Chronic renal insufficiency.  Looking back to 2015, this appears to be fairly stable currently.  Her most recent BUN was 76, creatinine 2.72.  She now has a chronic Foley catheter.       CURRENT MEDICATIONS:  Medication list is reviewed.              Norco 5/325 q.4 p.r.n.       Clonidine 0.2 three times a day.    Lovenox 80 daily.    Protonix 40 q.d.      Magic Mouthwash 5 cc q.i.d.       Prostat b.i.d.      REVIEW OF SYSTEMS:    CHEST/RESPIRATORY:  No shortness of breath.   CARDIAC:  No chest pain.   GI:  She is not complaining of abdominal pain.  States her mouth hurts, but no clear dysphagia.   SKIN:  She apparently has a worsening decubitus ulcer over her coccyx.    PHYSICAL EXAMINATION:   VITAL SIGNS:  WEIGHT:  Her weight is 146.8 pounds.  This is a six-pound weight loss from 11/30/2014.    CHEST/RESPIRATORY:  Exam is clear.        CARDIOVASCULAR:   CARDIAC:  Heart sounds are normal.  She appears to be euvolemic.     GASTROINTESTINAL:   ABDOMEN:  No distention.   LIVER/SPLEEN/KIDNEYS:  No liver is palpable.  No spleen.   GENITOURINARY:   BLADDER:  Has a Foley catheter in place.   CIRCULATION:   EDEMA/VARICOSITIES:  Extremities:  Much less edema in the right leg, really quite a difference.   SKIN:   INSPECTION:  The area over her coccyx and proximal buttocks is worrisome.  This looks to be a stage II. There is erythema and some duskiness around this.  This is bad enough that I will consider empiric antibiotics.     NEUROLOGICAL:    SENSATION/STRENGTH:  She is now able to wiggle her toes.  She has probably 3+/5 plantar flexion, 2/5 dorsiflexion, 2/5 hip flexors.  All of this is considerably better in the right leg than on admission when she literally had no movement.    ASSESSMENT/PLAN:                     Spinal mass.   Currently undergoing radiation for squamous cell cancer.  There has been improvement in both the edema and neurologic function in her right leg.    Worsening protein calorie malnutrition.  I do not see an urgent point in doing another albumin on her, which was 2.1 on 12/05/2014.  I will start her on Remeron as an appetite stimulant.    Anemia.  This is probably multifactorial chronic disease, including stage IV chronic renal failure and anemia of malnutrition.    Chronic renal failure.  This does not appear to be unstable.  She appears to be euvolemic.  Foley catheter in place.    Stage II pressure ulcer with concomitant cellulitis.  I am going to start her on antibiotics.     Apparent plans for discharge next week. I will look into this. Somebody will need training for the Lovenox which I suspect will be a long-standing issue

## 2014-12-19 NOTE — Progress Notes (Signed)
[name] has received 10 fractions to her R-L spine. She denies any pain today.  Able to stand with assistance today.  Travel by wheelchair.  She will be released from the [facility] today.

## 2014-12-20 ENCOUNTER — Ambulatory Visit
Admit: 2014-12-20 | Discharge: 2014-12-20 | Disposition: A | Discharge status: Home / Self Care | Payer: Medicare Other | Attending: Radiation Oncology | Admitting: Radiation Oncology

## 2014-12-20 DIAGNOSIS — N189 Chronic kidney disease, unspecified: Secondary | ICD-10-CM | POA: Diagnosis not present

## 2014-12-20 DIAGNOSIS — R6510 Systemic inflammatory response syndrome (SIRS) of non-infectious origin without acute organ dysfunction: Secondary | ICD-10-CM | POA: Diagnosis not present

## 2014-12-20 DIAGNOSIS — I129 Hypertensive chronic kidney disease with stage 1 through stage 4 chronic kidney disease, or unspecified chronic kidney disease: Secondary | ICD-10-CM | POA: Diagnosis not present

## 2014-12-20 DIAGNOSIS — C3490 Malignant neoplasm of unspecified part of unspecified bronchus or lung: Secondary | ICD-10-CM | POA: Diagnosis not present

## 2014-12-20 DIAGNOSIS — N179 Acute kidney failure, unspecified: Secondary | ICD-10-CM | POA: Diagnosis not present

## 2014-12-21 ENCOUNTER — Ambulatory Visit
Admit: 2014-12-21 | Discharge: 2014-12-21 | Disposition: A | Discharge status: Home / Self Care | Payer: Medicare Other | Attending: Radiation Oncology | Admitting: Radiation Oncology

## 2014-12-21 DIAGNOSIS — C3490 Malignant neoplasm of unspecified part of unspecified bronchus or lung: Secondary | ICD-10-CM | POA: Diagnosis not present

## 2014-12-22 ENCOUNTER — Ambulatory Visit
Admit: 2014-12-22 | Discharge: 2014-12-22 | Disposition: A | Discharge status: Home / Self Care | Payer: Medicare Other | Attending: Radiation Oncology | Admitting: Radiation Oncology

## 2014-12-22 DIAGNOSIS — C3490 Malignant neoplasm of unspecified part of unspecified bronchus or lung: Secondary | ICD-10-CM | POA: Diagnosis not present

## 2014-12-23 ENCOUNTER — Ambulatory Visit
Admit: 2014-12-23 | Discharge: 2014-12-23 | Disposition: A | Discharge status: Home / Self Care | Payer: Medicare Other | Attending: Radiation Oncology | Admitting: Radiation Oncology

## 2014-12-23 DIAGNOSIS — C3490 Malignant neoplasm of unspecified part of unspecified bronchus or lung: Secondary | ICD-10-CM | POA: Diagnosis not present

## 2014-12-24 ENCOUNTER — Encounter: Payer: Self-pay | Admitting: Radiation Oncology

## 2014-12-24 NOTE — Progress Notes (Signed)
[facility] End of Treatment Note  Name:[name]  Date: 12/24/2014 UPJ:[id number] DOB:07-09-1933   Status:outpatient    CC: Delphina Cahill, MD  Dr. Curt Bears  REFERRING PHYSICIAN: Dr. Oren Binet    DIAGNOSIS: Metastatic squamous cell carcinoma of the lung to lumbar spine/right paraspinal region   INDICATION FOR TREATMENT: Palliative   TREATMENT DATES: 12/06/2014 through 12/23/2014                          SITE/DOSE:   Lumbar spine/right paraspinal mass 3500 cGy in 14 sessions                         BEAMS/ENERGY:   Three-dimensional dynamic conformal arcs (4) with mixed 6 MV/15 MV photons                NARRATIVE: [name] tolerated treatment well with improvement of her pain by completion of therapy.  She still had a foot drop.                            PLAN: Routine followup in one month. Patient instructed to call if questions or worsening complaints in interim.

## 2014-12-24 NOTE — Progress Notes (Signed)
12/21/2014   Weekly Management Note:  Site: Lumbar spine and right paraspinal region Current Dose:  3000  cGy Projected Dose: 3500  cGy  Narrative: The patient is seen today for routine under treatment assessment. CBCT/MVCT images/port films were reviewed. The chart was reviewed.   The patient was seen on July 24 for weekly management in the treatment area.  She was without new complaints.  Inspection of the right lower back showed mild erythema but no desquamation.  Nephrostomy tube intact.  Physical Examination: There were no vitals filed for this visit..  Weight:    Inspection of the right lower back showed mild erythema but no desquamation.  Nephrostomy tube intact.  Impression: Tolerating radiation therapy well.  Plan: Follow-up visit in one month following completion of radiation therapy.

## 2014-12-29 ENCOUNTER — Other Ambulatory Visit: Payer: Self-pay | Admitting: Urology

## 2014-12-29 ENCOUNTER — Other Ambulatory Visit: Payer: Medicare Other

## 2014-12-29 ENCOUNTER — Ambulatory Visit: Payer: Medicare Other | Admitting: Internal Medicine

## 2014-12-29 ENCOUNTER — Telehealth: Payer: Self-pay | Admitting: Medical Oncology

## 2014-12-29 DIAGNOSIS — N1330 Unspecified hydronephrosis: Secondary | ICD-10-CM

## 2014-12-29 NOTE — Telephone Encounter (Signed)
Pt and daughter, Suanne Marker stated "I did not know about appt". Pt is seeing "kidney doctor today . The tube is draining and it smells bad" . i told her we will r/s pt.

## 2014-12-29 NOTE — Telephone Encounter (Addendum)
[name] daughter called ( she brings pt to her appts). Can pt wait until aug 8 to see [facility]? Pt missed appt today.Note to mohamed

## 2015-01-02 ENCOUNTER — Other Ambulatory Visit: Payer: Self-pay | Admitting: Radiology

## 2015-01-02 ENCOUNTER — Ambulatory Visit (HOSPITAL_COMMUNITY)
Admission: RE | Admit: 2015-01-02 | Discharge: 2015-01-02 | Disposition: A | Discharge status: Home / Self Care | Payer: Medicare Other | Source: Ambulatory Visit | Attending: Internal Medicine | Admitting: Internal Medicine

## 2015-01-02 ENCOUNTER — Other Ambulatory Visit (HOSPITAL_BASED_OUTPATIENT_CLINIC_OR_DEPARTMENT_OTHER): Payer: Medicare Other

## 2015-01-02 ENCOUNTER — Other Ambulatory Visit: Payer: Self-pay | Admitting: Internal Medicine

## 2015-01-02 ENCOUNTER — Ambulatory Visit: Payer: Medicare Other | Admitting: Internal Medicine

## 2015-01-02 DIAGNOSIS — C3491 Malignant neoplasm of unspecified part of right bronchus or lung: Secondary | ICD-10-CM

## 2015-01-02 DIAGNOSIS — C7951 Secondary malignant neoplasm of bone: Secondary | ICD-10-CM | POA: Diagnosis not present

## 2015-01-02 DIAGNOSIS — I2510 Atherosclerotic heart disease of native coronary artery without angina pectoris: Secondary | ICD-10-CM | POA: Diagnosis not present

## 2015-01-02 DIAGNOSIS — R918 Other nonspecific abnormal finding of lung field: Secondary | ICD-10-CM | POA: Insufficient documentation

## 2015-01-02 DIAGNOSIS — C3490 Malignant neoplasm of unspecified part of unspecified bronchus or lung: Secondary | ICD-10-CM

## 2015-01-02 DIAGNOSIS — I700 Atherosclerosis of aorta: Secondary | ICD-10-CM | POA: Insufficient documentation

## 2015-01-02 LAB — CBC WITH DIFFERENTIAL/PLATELET
BASO%: 0.1 % (ref 0.0–2.0)
Basophils Absolute: 0 10*3/uL (ref 0.0–0.1)
EOS ABS: 0.5 10*3/uL (ref 0.0–0.5)
EOS%: 7.4 % — ABNORMAL HIGH (ref 0.0–7.0)
HCT: 25.6 % — ABNORMAL LOW (ref 34.8–46.6)
HEMOGLOBIN: 7.9 g/dL — AB (ref 11.6–15.9)
LYMPH#: 0.3 10*3/uL — AB (ref 0.9–3.3)
LYMPH%: 4.5 % — AB (ref 14.0–49.7)
MCH: 24.8 pg — AB (ref 25.1–34.0)
MCHC: 30.9 g/dL — AB (ref 31.5–36.0)
MCV: 80.3 fL (ref 79.5–101.0)
MONO#: 0.6 10*3/uL (ref 0.1–0.9)
MONO%: 8.5 % (ref 0.0–14.0)
NEUT%: 79.5 % — AB (ref 38.4–76.8)
NEUTROS ABS: 5.4 10*3/uL (ref 1.5–6.5)
Platelets: 280 10*3/uL (ref 145–400)
RBC: 3.19 10*6/uL — ABNORMAL LOW (ref 3.70–5.45)
RDW: 15.4 % — ABNORMAL HIGH (ref 11.2–14.5)
WBC: 6.9 10*3/uL (ref 3.9–10.3)

## 2015-01-02 LAB — COMPREHENSIVE METABOLIC PANEL (CC13)
ALBUMIN: 1.8 g/dL — AB (ref 3.5–5.0)
ALK PHOS: 102 U/L (ref 40–150)
AST: 8 U/L (ref 5–34)
Anion Gap: 11 mEq/L (ref 3–11)
BUN: 54.9 mg/dL — AB (ref 7.0–26.0)
CALCIUM: 8.3 mg/dL — AB (ref 8.4–10.4)
CHLORIDE: 105 meq/L (ref 98–109)
CO2: 20 meq/L — AB (ref 22–29)
Creatinine: 3 mg/dL (ref 0.6–1.1)
EGFR: 14 mL/min/{1.73_m2} — ABNORMAL LOW (ref >90)
GLUCOSE: 138 mg/dL (ref 70–140)
Potassium: 3.1 mEq/L — ABNORMAL LOW (ref 3.5–5.1)
SODIUM: 136 meq/L (ref 136–145)
TOTAL PROTEIN: 6.3 g/dL — AB (ref 6.4–8.3)
Total Bilirubin: 0.29 mg/dL (ref 0.20–1.20)

## 2015-01-03 ENCOUNTER — Other Ambulatory Visit (HOSPITAL_COMMUNITY): Payer: Self-pay | Admitting: Interventional Radiology

## 2015-01-03 ENCOUNTER — Ambulatory Visit (HOSPITAL_COMMUNITY)
Admission: RE | Admit: 2015-01-03 | Discharge: 2015-01-03 | Disposition: A | Discharge status: Home / Self Care | Payer: Medicare Other | Source: Ambulatory Visit | Attending: Urology | Admitting: Urology

## 2015-01-03 ENCOUNTER — Other Ambulatory Visit: Payer: Self-pay | Admitting: Urology

## 2015-01-03 ENCOUNTER — Ambulatory Visit (HOSPITAL_COMMUNITY)
Admission: RE | Admit: 2015-01-03 | Discharge: 2015-01-03 | Disposition: A | Discharge status: Home / Self Care | Payer: Medicare Other | Source: Ambulatory Visit | Attending: Radiology | Admitting: Radiology

## 2015-01-03 ENCOUNTER — Encounter (HOSPITAL_COMMUNITY): Payer: Self-pay

## 2015-01-03 DIAGNOSIS — I2510 Atherosclerotic heart disease of native coronary artery without angina pectoris: Secondary | ICD-10-CM | POA: Insufficient documentation

## 2015-01-03 DIAGNOSIS — N1330 Unspecified hydronephrosis: Secondary | ICD-10-CM | POA: Diagnosis not present

## 2015-01-03 DIAGNOSIS — Z7902 Long term (current) use of antithrombotics/antiplatelets: Secondary | ICD-10-CM | POA: Diagnosis not present

## 2015-01-03 DIAGNOSIS — N189 Chronic kidney disease, unspecified: Secondary | ICD-10-CM | POA: Insufficient documentation

## 2015-01-03 DIAGNOSIS — I129 Hypertensive chronic kidney disease with stage 1 through stage 4 chronic kidney disease, or unspecified chronic kidney disease: Secondary | ICD-10-CM | POA: Insufficient documentation

## 2015-01-03 DIAGNOSIS — C3491 Malignant neoplasm of unspecified part of right bronchus or lung: Secondary | ICD-10-CM | POA: Insufficient documentation

## 2015-01-03 DIAGNOSIS — Z9181 History of falling: Secondary | ICD-10-CM | POA: Diagnosis not present

## 2015-01-03 DIAGNOSIS — I700 Atherosclerosis of aorta: Secondary | ICD-10-CM | POA: Diagnosis not present

## 2015-01-03 DIAGNOSIS — I255 Ischemic cardiomyopathy: Secondary | ICD-10-CM | POA: Insufficient documentation

## 2015-01-03 DIAGNOSIS — Z436 Encounter for attention to other artificial openings of urinary tract: Secondary | ICD-10-CM | POA: Insufficient documentation

## 2015-01-03 DIAGNOSIS — Z880 Allergy status to penicillin: Secondary | ICD-10-CM | POA: Insufficient documentation

## 2015-01-03 DIAGNOSIS — K219 Gastro-esophageal reflux disease without esophagitis: Secondary | ICD-10-CM | POA: Diagnosis not present

## 2015-01-03 DIAGNOSIS — Z882 Allergy status to sulfonamides status: Secondary | ICD-10-CM | POA: Insufficient documentation

## 2015-01-03 DIAGNOSIS — Z86718 Personal history of other venous thrombosis and embolism: Secondary | ICD-10-CM | POA: Diagnosis not present

## 2015-01-03 LAB — CBC WITH DIFFERENTIAL/PLATELET
BASOS PCT: 0 % (ref 0–1)
Basophils Absolute: 0 10*3/uL (ref 0.0–0.1)
EOS ABS: 0.2 10*3/uL (ref 0.0–0.7)
Eosinophils Relative: 3 % (ref 0–5)
HCT: 24.7 % — ABNORMAL LOW (ref 36.0–46.0)
HEMOGLOBIN: 7.7 g/dL — AB (ref 12.0–15.0)
Lymphocytes Relative: 3 % — ABNORMAL LOW (ref 12–46)
Lymphs Abs: 0.2 10*3/uL — ABNORMAL LOW (ref 0.7–4.0)
MCH: 25 pg — ABNORMAL LOW (ref 26.0–34.0)
MCHC: 31.2 g/dL (ref 30.0–36.0)
MCV: 80.2 fL (ref 78.0–100.0)
Monocytes Absolute: 0.6 10*3/uL (ref 0.1–1.0)
Monocytes Relative: 10 % (ref 3–12)
NEUTROS PCT: 84 % — AB (ref 43–77)
Neutro Abs: 5.7 10*3/uL (ref 1.7–7.7)
Platelets: 292 10*3/uL (ref 150–400)
RBC: 3.08 MIL/uL — ABNORMAL LOW (ref 3.87–5.11)
RDW: 15.7 % — ABNORMAL HIGH (ref 11.5–15.5)
WBC: 6.7 10*3/uL (ref 4.0–10.5)

## 2015-01-03 LAB — BASIC METABOLIC PANEL
Anion gap: 12 (ref 5–15)
BUN: 60 mg/dL — ABNORMAL HIGH (ref 6–20)
CO2: 20 mmol/L — ABNORMAL LOW (ref 22–32)
CREATININE: 3.15 mg/dL — AB (ref 0.44–1.00)
Calcium: 8 mg/dL — ABNORMAL LOW (ref 8.9–10.3)
Chloride: 103 mmol/L (ref 101–111)
GFR calc Af Amer: 15 mL/min — ABNORMAL LOW (ref >60)
GFR calc non Af Amer: 13 mL/min — ABNORMAL LOW (ref >60)
Glucose, Bld: 134 mg/dL — ABNORMAL HIGH (ref 65–99)
Potassium: 3 mmol/L — ABNORMAL LOW (ref 3.5–5.1)
Sodium: 135 mmol/L (ref 135–145)

## 2015-01-03 LAB — PROTIME-INR
INR: 1.31 (ref 0.00–1.49)
Prothrombin Time: 16.4 seconds — ABNORMAL HIGH (ref 11.6–15.2)

## 2015-01-03 LAB — APTT: APTT: 48 s — AB (ref 24–37)

## 2015-01-03 MED ORDER — SODIUM CHLORIDE 0.9 % IV SOLN
INTRAVENOUS | Status: DC
  Administered 2015-01-03: 08:00:00 via INTRAVENOUS

## 2015-01-03 MED ORDER — FENTANYL CITRATE (PF) 100 MCG/2ML IJ SOLN
25.0000 ug | Freq: Once | INTRAMUSCULAR | Status: AC
  Administered 2015-01-03: 25 ug via INTRAVENOUS

## 2015-01-03 MED ORDER — MIDAZOLAM HCL 2 MG/2ML IJ SOLN
INTRAMUSCULAR | Status: AC | PRN
  Administered 2015-01-03: 0.5 mg via INTRAVENOUS

## 2015-01-03 MED ORDER — FENTANYL CITRATE (PF) 100 MCG/2ML IJ SOLN
INTRAMUSCULAR | Status: AC
  Filled 2015-01-03: qty 2

## 2015-01-03 MED ORDER — VANCOMYCIN HCL IN DEXTROSE 1-5 GM/200ML-% IV SOLN
1000.0000 mg | Freq: Once | INTRAVENOUS | Status: AC
  Administered 2015-01-03: 1000 mg via INTRAVENOUS
  Filled 2015-01-03: qty 200

## 2015-01-03 MED ORDER — LIDOCAINE HCL 1 % IJ SOLN
INTRAMUSCULAR | Status: AC
  Filled 2015-01-03: qty 20

## 2015-01-03 MED ORDER — IOHEXOL 300 MG/ML  SOLN
20.0000 mL | Freq: Once | INTRAMUSCULAR | Status: AC | PRN
  Administered 2015-01-03: 18 mL

## 2015-01-03 MED ORDER — MIDAZOLAM HCL 2 MG/2ML IJ SOLN
INTRAMUSCULAR | Status: AC
  Filled 2015-01-03: qty 2

## 2015-01-03 NOTE — Discharge Instructions (Signed)
Percutaneous Nephrostomy Percutaneous nephrostomy is the insertion of a flexible tube into your kidney through your back. This is done to provide access to an obstructed kidney. The goal of this procedure is to allow the urine that is produced in the kidney to drain, which will relieve pressure or infection from damaging your kidney. This will allow your health care provider to identify the cause of the obstruction and plan appropriate treatment. LET [facility] CARE PROVIDER KNOW ABOUT:  Any allergies you have.  All medicines you are taking, including vitamins, herbs, eye drops, creams, and over-the-counter medicines.  Previous problems you or members of your family have had with the use of anesthetics.  Any blood disorders you have.  Previous surgeries you have had.  Medical conditions you have.  Possibility of pregnancy, if this applies. RISKS AND COMPLICATIONS Generally, this is a safe procedure. However, as with any procedure, problems can occur. Possible problems include:  Infection.  Damage to the organs surrounding your kidney. BEFORE THE PROCEDURE Your health care provider may want you to have blood tests. These tests can help tell how well your kidneys and liver are working. They can also show how well your blood clots. If you take anticoagulant medicine, sometimes called blood thinners, ask your health care provider when you should stop taking them. Make arrangements for someone to take you home after the procedure, if needed. PROCEDURE The procedure is performed as follows:  An intravenous IV catheter will be inserted into one of the veins in your arm. Medicine will be able to flow directly into your body through this catheter. You may be given medicines through this tube to help prevent nausea and pain, and antibiotics to help prevent infection.   You will be placed on your stomach and given medicine that numbs the site (local anesthetic) where the percutaneous  nephrostomy tube will be inserted.  You will be given a medicine that makes you go to sleep (general anesthetic).  The percutaneous nephrostomy tube, which is thin and flexible, will be inserted into a needle.  The needle will be inserted into your body and guided to your kidney with the help of an imaging method that uses X-ray images (fluoroscopy).  A dye will be injected through the nephrostomy tube. Then, X-ray images that highlight your kidney will be taken.  The needle is then removed, but the nephrostomy tube will be left in your kidney. The tube will drain urine from your kidney to a collection bag outside your body. The tube is usually secured to your skin with stitches (sutures). AFTER THE PROCEDURE   You will stay in a recovery area until the sedation has worn off. Your blood pressure and pulse will be checked.  You will need to remain lying down for several hours. Document Released: 03/10/2013 Document Revised: 10/04/2013 Document Reviewed: 03/10/2013 [facility] Patient Information 2015 [place], [place]. This information is not intended to replace advice given to you by your health care provider. Make sure you discuss any questions you have with your health care provider. Ureteral Stent Implantation, Care After Refer to this sheet in the next few weeks. These instructions provide you with information on caring for yourself after your procedure. Your health care provider may also give you more specific instructions. Your treatment has been planned according to current medical practices, but problems sometimes occur. Call your health care provider if you have any problems or questions after your procedure. WHAT TO EXPECT AFTER THE PROCEDURE You should be back to  normal activity within 48 hours after the procedure. Nausea and vomiting may occur and are commonly the result of anesthesia. It is common to experience sharp pain in the back or lower abdomen and penis with voiding. This is  caused by movement of the ends of the stent with the act of urinating.It usually goes away within minutes after you have stopped urinating. HOME CARE INSTRUCTIONS Make sure to drink plenty of fluids. You may have small amounts of bleeding, causing your urine to be red. This is normal. Certain movements may trigger pain or a feeling that you need to urinate. You may be given medicines to prevent infection or bladder spasms. Be sure to take all medicines as directed. Only take over-the-counter or prescription medicines for pain, discomfort, or fever as directed by your health care provider. Do not take aspirin, as this can make bleeding worse. Your stent will be left in until the blockage is resolved. This may take 2 weeks or longer, depending on the reason for stent implantation. You may have an X-ray exam to make sure your ureter is open and that the stent has not moved out of position (migrated). The stent can be removed by your health care provider in the office. Medicines may be given for comfort while the stent is being removed. Be sure to keep all follow-up appointments so your health care provider can check that you are healing properly. SEEK MEDICAL CARE IF:  You experience increasing pain.  Your pain medicine is not working. SEEK IMMEDIATE MEDICAL CARE IF:  Your urine is dark red or has blood clots.  You are leaking urine (incontinent).  You have a fever, chills, feeling sick to your stomach (nausea), or vomiting.  Your pain is not relieved by pain medicine.  The end of the stent comes out of the urethra.  You are unable to urinate. Document Released: 01/20/2013 Document Revised: 05/25/2013 Document Reviewed: 01/20/2013 [facility] Patient Information 2015 [place], [place]. This information is not intended to replace advice given to you by your health care provider. Make sure you discuss any questions you have with your health care provider. Ureteral Stent Implantation Ureteral stent  implantation is the implantation of a soft plastic tube with multiple holes into the tube that drains urine from your kidney to your bladder (ureter). The stent helps drain your kidney when there is a blockage of the flow of urine in your ureter. The stent has a coil on each end to keep it from falling out. One end stays in the kidney. The other end stays in the bladder. It is most often taken out after any blockage has been removed or your ureter has healed. Short-term stents have a string attached to make removal quite easy. Removal of a short-term stent can be done in your health care provider's office or by you at home. Long-term stents need to be changed every few months. LET [employer] CARE PROVIDER KNOW ABOUT:  Any allergies you have.  All medicines you are taking, including vitamins, herbs, eye drops, creams, and over-the-counter medicines.  Previous problems you or members of your family have had with the use of anesthetics.  Any blood disorders you have.  Previous surgeries you have had.  Medical conditions you have. RISKS AND COMPLICATIONS Generally, ureteral stent implantation is a safe procedure. However, as with any procedure, complications can occur. Possible complications include:  Movement of the stent away from where it was originally placed (migration). This may affect the ability of the stent  to properly drain your kidney. If migration of the stent occurs, the stent may need to be replaced or repositioned.  Perforation of the ureter.  Infection. BEFORE THE PROCEDURE  You may be asked to wash your genital area with sterile soap the morning of your procedure.  You may be given an oral antibiotic which you should take with a sip of water as prescribed by your health care provider.  You may be asked to not eat or drink for 8 hours before the surgery. PROCEDURE  First you will be given an anesthetic so you do not feel pain during the procedure.  Your health care  provider will insert a special lighted instrument called a cystoscope into your bladder. This allows your health care provider to see the opening to your ureter.  A thin wire is carefully threaded into your bladder and up the ureter. The stent is inserted over the wire and the wire is then removed.  Your bladder will be emptied of urine. AFTER THE PROCEDURE You will be taken to a recovery room until it is okay for you to go home. Document Released: 05/17/2000 Document Revised: 05/25/2013 Document Reviewed: 10/27/2012 [facility] Patient Information 2015 [place], [place]. This information is not intended to replace advice given to you by your health care provider. Make sure you discuss any questions you have with your health care provider. Conscious Sedation, Adult, Care After Refer to this sheet in the next few weeks. These instructions provide you with information on caring for yourself after your procedure. Your health care provider may also give you more specific instructions. Your treatment has been planned according to current medical practices, but problems sometimes occur. Call your health care provider if you have any problems or questions after your procedure. WHAT TO EXPECT AFTER THE PROCEDURE  After your procedure:  You may feel sleepy, clumsy, and have poor balance for several hours.  Vomiting may occur if you eat too soon after the procedure. HOME CARE INSTRUCTIONS  Do not participate in any activities where you could become injured for at least 24 hours. Do not:  Drive.  Swim.  Ride a bicycle.  Operate heavy machinery.  Cook.  Use power tools.  Climb ladders.  Work from a high place.  Do not make important decisions or sign legal documents until you are improved.  If you vomit, drink water, juice, or soup when you can drink without vomiting. Make sure you have little or no nausea before eating solid foods.  Only take over-the-counter or prescription medicines for  pain, discomfort, or fever as directed by your health care provider.  Make sure you and your family fully understand everything about the medicines given to you, including what side effects may occur.  You should not drink alcohol, take sleeping pills, or take medicines that cause drowsiness for at least 24 hours.  If you smoke, do not smoke without supervision.  If you are feeling better, you may resume normal activities 24 hours after you were sedated.  Keep all appointments with your health care provider. SEEK MEDICAL CARE IF:  Your skin is pale or bluish in color.  You continue to feel nauseous or vomit.  Your pain is getting worse and is not helped by medicine.  You have bleeding or swelling.  You are still sleepy or feeling clumsy after 24 hours. SEEK IMMEDIATE MEDICAL CARE IF:  You develop a rash.  You have difficulty breathing.  You develop any type of allergic problem.  You  have a fever. MAKE SURE YOU:  Understand these instructions.  Will watch your condition.  Will get help right away if you are not doing well or get worse. Document Released: 03/10/2013 Document Reviewed: 03/10/2013 [facility] Patient Information 2015 [place], [place]. This information is not intended to replace advice given to you by your health care provider. Make sure you discuss any questions you have with your health care provider.

## 2015-01-03 NOTE — Procedures (Signed)
Successful fluoroscopic guided recanalization of right sided PCN track and placement of a right sided ureteral stent. Successful right sided PCN exchange.   No immediate complications.   Ronny Bacon, MD Pager #: [phone]

## 2015-01-03 NOTE — Progress Notes (Signed)
Patient ID: [name], female   DOB: 1933-10-12, 81 y.o.   MRN: [id number]    Referring Physician(s): [place]  Chief Complaint: Metastatic lung cancer with prior mild obstructive right hydronephrosis   Subjective: Pt familiar to IR service from recent CT biopsy of infiltrative rt paraspinal mass(met SCC) as well as rt PCN. She has known metastatic squamous cell carcinoma of lung with recently treated Proteus UTI/ bacteremia and bilateral LE DVT's. PMH also sig for CRF with latest creatinine of 3.15, PCM, ICM, GERD, CAD,HTN. Pt's daughter reports that pt's rt PCN fell out on 7/30 and was told by urology to keep originally scheduled appt for left JJ for today. Pt is afebrile but does c/o occ cough/congestion, back pain, occ nausea, anorexia and weakness.    Allergies: Amlodipine; Ciprofloxacin; Statins; Aspirin; Cephalexin; Hydralazine; Iron; Mirtazapine; Sulfonamide derivatives; Penicillins; and Tape  Medications: Prior to Admission medications   Medication Sig Start Date End Date Taking? Authorizing Provider  cloNIDine (CATAPRES) 0.2 MG tablet Take 1 tablet (0.2 mg total) by mouth 3 (three) times daily. 11/29/14  Yes Shanker Kristeen Mans, MD  furosemide (LASIX) 20 MG tablet Take 20 mg by mouth daily. 12/19/14  Yes Historical Provider, MD  HYDROcodone-acetaminophen (NORCO/VICODIN) 5-325 MG per tablet Take 1 tablet by mouth every 6 (six) hours as needed for moderate pain. Max APAP 3gm/24 hrs from all sources 11/30/14  Yes Estill Dooms, MD  loperamide (IMODIUM A-D) 2 MG tablet Take 2 mg by mouth 4 (four) times daily as needed for diarrhea or loose stools (diarrhea).   Yes Historical Provider, MD  LORazepam (ATIVAN) 1 MG tablet Take one tablet by mouth every 8 hours for anxiety 11/30/14  Yes Gildardo Cranker, DO  metoprolol succinate (TOPROL-XL) 25 MG 24 hr tablet Take 1 tablet (25 mg total) by mouth daily. 11/29/14  Yes Shanker Kristeen Mans, MD  pantoprazole (PROTONIX) 40 MG tablet TAKE  ONE TABLET BY MOUTH ONCE DAILY. 10/14/14  Yes Sherren Mocha, MD  SANTYL ointment Apply 1 application topically at bedtime as needed. For bed sores 12/27/14  Yes Historical Provider, MD  enoxaparin (LOVENOX) 80 MG/0.8ML injection Inject 0.8 mLs (80 mg total) into the skin daily. Patient not taking: Reported on 01/03/2015 11/29/14   Jonetta Osgood, MD  furosemide (LASIX) 40 MG tablet Take 1 tablet (40 mg total) by mouth daily. Patient not taking: Reported on 01/03/2015 02/03/14   Sherren Mocha, MD  polyethylene glycol [facility] / Floria Raveling) packet Take 17 g by mouth daily. 11/29/14   Shanker Kristeen Mans, MD     Vital Signs: BP 95/51 mmHg  Pulse 110  Temp(Src) 98.3 F (36.8 C) (Oral)  Resp 18  SpO2 97%  Physical Exam pt awake/alert; chest- few scatt rhonchi noted; heart- RRR; abd- soft,+BS, mild- mod rt CVA tenderness; prior PCN site with small amt light green drainage on gauze; ext- no sig edema  Imaging: Ct Chest Wo Contrast  01/02/2015   CLINICAL DATA:  Restaging right lung cancer.  EXAM: CT CHEST WITHOUT CONTRAST  TECHNIQUE: Multidetector CT imaging of the chest was performed following the standard protocol without IV contrast.  COMPARISON:  11/24/2014  FINDINGS: Mediastinum: The heart size appears normal. There is no pericardial effusion identified. Aortic atherosclerosis noted. Calcifications within the RCA, LAD and left circumflex coronary arteries. The trachea appears patent and is midline. Normal appearance of the esophagus. No mediastinal or hilar adenopathy.  Lungs/Pleura: No pleural effusion. Scarring noted within the posterior right lower lobe. Lingular scarring also noted  index nodule within the left upper lobe measures 1.4 by 1.0 cm, image 21/ series 4. Previously 1.2 x 1.1 cm. The left lower lobe pulmonary nodule measures 1.2 x 0.8 cm, image 56/series 4. Previously 1.0 x 0.8 cm.  Upper Abdomen: The visualized portions of the liver are normal. The adrenal glands are unremarkable. Normal  appearance of the visualized spleen. Left periaortic lymph node is again noted measuring 0.9 cm, image 70/series 2.  Musculoskeletal: No evidence for thoracic bone metastases.  IMPRESSION: 1. The index nodules within the left lung are stable to slightly increased in size in the interval. 2. Aortic atherosclerosis.   Electronically Signed   By: Kerby Moors M.D.   On: 01/02/2015 09:47    Labs:  CBC:  Recent Labs  12/12/14 0630 12/19/14 0630 01/02/15 0813 01/03/15 0706  WBC 7.4 6.2 6.9 6.7  HGB 7.6* 7.7* 7.9* 7.7*  HCT 22.3* 24.2* 25.6* 24.7*  PLT 292 445* 280 292    COAGS:  Recent Labs  11/23/14 0730 11/24/14 0700 11/26/14 0137 01/03/15 0706  INR 1.22 1.35 1.25 1.31  APTT 36 35  --  48*    BMP:  Recent Labs  11/29/14 0403 12/05/14 0440 12/12/14 0630 12/19/14 0630 01/02/15 0813  NA 134* 134* 133* 138 136  K 4.4 4.4 4.7 4.0 3.1*  CL 102 98* 96* 101  --   CO2 _0 20*  GLUCOSE 93 96 116* 109* 138  BUN 52* 43* 76* 86* 54.9*  CALCIUM 8.4* 8.3* 8.1* 8.6* 8.3*  CREATININE 2.57* 2.43* 2.72* 2.92* 3.0*  GFRNONAA 17* 18* 16* 14*  --   GFRAA 19* 21* 18* 17*  --     LIVER FUNCTION TESTS:  Recent Labs  11/24/14 0555 12/05/14 0440 12/19/14 0630 01/02/15 0813  BILITOT 0.7 0.6 0.6 0.29  AST 21 16 11* 8  ALT 10* 6* 7* <6  ALKPHOS 103 87 91 102  PROT 6.4* 6.0* 6.7 6.3*  ALBUMIN 2.5* 2.1* 2.0* 1.8*    Assessment and Plan: Pt with known metastatic squamous cell carcinoma of lung with recently treated Proteus UTI/ bacteremia and bilateral LE DVT's, bx of infiltrative rt paraspinal mass (SCC) on 11/23/14, right PCN secondary to obst hydro on 11/24/14.  PMH also sig for CRF with creatinine today of 3.15 , PCM, ICM, GERD, CAD,HTN. Pt's daughter reports that pt's rt PCN fell out on 7/30 and was told by urology to keep originally scheduled appt for left JJ for today. Pt is afebrile but does c/o occ cough/congestion, back pain, occ nausea, anorexia and weakness. Plan  is for noncontrasted CT abd to reassess degree of hydronephrosis with possible rt PCN/JJ placement. Details/risks of procedure, including but not limited to, internal bleeding, infection, injury to adjacent structures, inability to place nephrostomy d/w pt/daughter with their understanding and consent.  K today 3.0- will need replacement.    Signed: D. Rowe Robert 01/03/2015, 8:43 AM   I spent a total of 25 minutes in face to face in clinical consultation/evaluation, greater than 50% of which was counseling/coordinating care for right nephrostomy/possible ureteral stent placement

## 2015-01-09 ENCOUNTER — Ambulatory Visit (HOSPITAL_BASED_OUTPATIENT_CLINIC_OR_DEPARTMENT_OTHER): Payer: Medicare Other | Admitting: Internal Medicine

## 2015-01-09 ENCOUNTER — Encounter: Payer: Self-pay | Admitting: Internal Medicine

## 2015-01-09 ENCOUNTER — Telehealth: Payer: Self-pay | Admitting: Internal Medicine

## 2015-01-09 ENCOUNTER — Encounter: Payer: Self-pay | Admitting: *Deleted

## 2015-01-09 ENCOUNTER — Other Ambulatory Visit: Payer: Self-pay | Admitting: Urology

## 2015-01-09 ENCOUNTER — Ambulatory Visit (HOSPITAL_COMMUNITY)
Admission: RE | Admit: 2015-01-09 | Discharge: 2015-01-09 | Disposition: A | Discharge status: Home / Self Care | Payer: Medicare Other | Source: Ambulatory Visit | Attending: Interventional Radiology | Admitting: Interventional Radiology

## 2015-01-09 VITALS — BP 87/45 | HR 67 | Temp 97.8°F | Resp 16

## 2015-01-09 DIAGNOSIS — N1330 Unspecified hydronephrosis: Secondary | ICD-10-CM

## 2015-01-09 DIAGNOSIS — Z85118 Personal history of other malignant neoplasm of bronchus and lung: Secondary | ICD-10-CM | POA: Diagnosis not present

## 2015-01-09 DIAGNOSIS — F329 Major depressive disorder, single episode, unspecified: Secondary | ICD-10-CM | POA: Diagnosis not present

## 2015-01-09 DIAGNOSIS — Z436 Encounter for attention to other artificial openings of urinary tract: Secondary | ICD-10-CM | POA: Diagnosis present

## 2015-01-09 DIAGNOSIS — L89899 Pressure ulcer of other site, unspecified stage: Secondary | ICD-10-CM | POA: Diagnosis not present

## 2015-01-09 DIAGNOSIS — C7951 Secondary malignant neoplasm of bone: Secondary | ICD-10-CM

## 2015-01-09 DIAGNOSIS — N135 Crossing vessel and stricture of ureter without hydronephrosis: Secondary | ICD-10-CM | POA: Insufficient documentation

## 2015-01-09 DIAGNOSIS — C3490 Malignant neoplasm of unspecified part of unspecified bronchus or lung: Secondary | ICD-10-CM

## 2015-01-09 MED ORDER — MIRTAZAPINE 30 MG PO TABS: 30.0000 mg | ORAL_TABLET | Freq: Every day | ORAL | Status: DC

## 2015-01-09 MED ORDER — IOHEXOL 300 MG/ML  SOLN
10.0000 mL | Freq: Once | INTRAMUSCULAR | Status: AC | PRN
  Administered 2015-01-09: 10 mL

## 2015-01-09 NOTE — Procedures (Signed)
Antegrade nephrostogram demonstrates wide patency of the right double J ureteral stent. Successful fluoroscopic guided removal of R PCN. No immediate post procedural.

## 2015-01-09 NOTE — Progress Notes (Signed)
[place] Telephone:([phone]   Fax:([phone]) [place], MD  [place] [address]  PRINCIPAL DIAGNOSIS: Stage IV non-small cell lung cancer diagnosed in March 2007, with disease recurrence in June 2016 with positive PDL 1 expression.   PRIOR THERAPY:  1) Status post 6 cycles of systemic chemotherapy with carboplatin and docetaxel. Last dose was given January 16, 2006.  2) Palliative radiotherapy to the large right paraspinous mass with osseous invasion centered at L5 under the care of Dr. Valere Dross.  CURRENT THERAPY: Observation.  DISEASE STAGE: Stage IV non-small cell lung cancer diagnosed in March of 2007.  CHEMOTHERAPY INTENT: Palliative  CURRENT # OF CHEMOTHERAPY CYCLES: 0  CURRENT ANTIEMETICS: None  CURRENT SMOKING STATUS: Nonsmoker  ORAL CHEMOTHERAPY AND CONSENT: None  CURRENT BISPHOSPHONATES USE: None  LIVING WILL AND CODE STATUS: Full code  INTERVAL HISTORY: [name] 81 y.o. female returns to the clinic today for hospital followup visit accompanied by her daughter and grandson. The patient continues to complain of increasing fatigue and weakness as well as weight loss. She has some problem with the nephrostomy tube and she is referred to interventional radiology for evaluation. She is followed by urology. She also has a nonhealing decubitus ulcer and she would like referral to a wound clinic. Her pain has much improved after the palliative radiotherapy to the paraspinal mass. She denied having any significant chest pain, shortness of breath, cough or hemoptysis. She has no nausea or vomiting. She denied having any significant fever or chills. She is here today for evaluation and discussion of her treatment options.  MEDICAL HISTORY: Past Medical History  Diagnosis Date  . Hypertension   . Hypercholesterolemia   . CKD (chronic kidney disease)   . Coronary artery disease     a.  LHC (06/04/05):  LHC done in [place] with high grade RCA => s/p BMS to RCA;  b.  Nuclear (09/14/09): Lexiscan; Inf infarct with mild peri-infarct ishemia, EF 52%; Low Risk.  Marland Kitchen GERD (gastroesophageal reflux disease)   . Chronic anemia   . Ischemic cardiomyopathy     a. Echo (07/26/13): Mild LVH, EF 35-40%, diff HK, inf AK, Gr 2 DD, Tr AI, mildly dilated Ao root, MAC, mild MR, mild LAE, mod reduced RVSF.  . Non-small cell carcinoma of lung     Stage IV    ALLERGIES:  is allergic to amlodipine; ciprofloxacin; statins; aspirin; cephalexin; hydralazine; iron; mirtazapine; sulfonamide derivatives; penicillins; and tape.  MEDICATIONS:  Current Outpatient Prescriptions  Medication Sig Dispense Refill  . cloNIDine (CATAPRES) 0.2 MG tablet Take 1 tablet (0.2 mg total) by mouth 3 (three) times daily.    Marland Kitchen enoxaparin (LOVENOX) 80 MG/0.8ML injection Inject 0.8 mLs (80 mg total) into the skin daily.    . furosemide (LASIX) 40 MG tablet Take 1 tablet (40 mg total) by mouth daily. 90 tablet 3  . HYDROcodone-acetaminophen (NORCO/VICODIN) 5-325 MG per tablet Take 1 tablet by mouth every 6 (six) hours as needed for moderate pain. Max APAP 3gm/24 hrs from all sources 120 tablet 0  . loperamide (IMODIUM A-D) 2 MG tablet Take 2 mg by mouth 4 (four) times daily as needed for diarrhea or loose stools (diarrhea).    . LORazepam (ATIVAN) 1 MG tablet Take one tablet by mouth every 8 hours for anxiety 90 tablet 5  . metoprolol succinate (TOPROL-XL) 25 MG 24 hr tablet Take 1 tablet (25 mg  total) by mouth daily.    . pantoprazole (PROTONIX) 40 MG tablet TAKE ONE TABLET BY MOUTH ONCE DAILY. 90 tablet 1  . polyethylene glycol (MIRALAX / GLYCOLAX) packet Take 17 g by mouth daily.    Marland Kitchen SANTYL ointment Apply 1 application topically at bedtime as needed. For bed sores    . mirtazapine (REMERON) 30 MG tablet Take 1 tablet (30 mg total) by mouth at bedtime. 30 tablet 2   No current facility-administered medications for this visit.     SURGICAL HISTORY:  Past Surgical History  Procedure Laterality Date  . Hematoma evacuation  December 2006    groin  . Appendectomy    . Cataract extraction    . Hemorroidectomy    . Laminectomy      REVIEW OF SYSTEMS:  A comprehensive review of systems was negative except for: Constitutional: positive for anorexia, fatigue and weight loss Musculoskeletal: positive for muscle weakness Neurological: positive for weakness Sacral decubitus ulcer   PHYSICAL EXAMINATION: General appearance: alert, cooperative and no distress Head: Normocephalic, without obvious abnormality, atraumatic Neck: no adenopathy Lymph nodes: Cervical, supraclavicular, and axillary nodes normal. Resp: clear to auscultation bilaterally Back: symmetric, no curvature. ROM normal. No CVA tenderness., Decubitus ulcer in the lower back Cardio: regular rate and rhythm, S1, S2 normal, no murmur, click, rub or gallop GI: soft, non-tender; bowel sounds normal; no masses,  no organomegaly Extremities: extremities normal, atraumatic, no cyanosis or edema Neurologic: Alert and oriented X 3, normal strength and tone. Normal symmetric reflexes. Normal coordination and gait  ECOG PERFORMANCE STATUS: 2 - Symptomatic, <50% confined to bed  Blood pressure 87/45, pulse 67, temperature 97.8 F (36.6 C), temperature source Oral, resp. rate 16.  LABORATORY DATA: Lab Results  Component Value Date   WBC 6.7 01/03/2015   HGB 7.7* 01/03/2015   HCT 24.7* 01/03/2015   MCV 80.2 01/03/2015   PLT 292 01/03/2015      Chemistry      Component Value Date/Time   NA 135 01/03/2015 0750   NA 136 01/02/2015 0813   NA 143 12/23/2011 0830   K 3.0* 01/03/2015 0750   K 3.1* 01/02/2015 0813   K 4.7 12/23/2011 0830   CL 103 01/03/2015 0750   CL 104 06/19/2012 0958   CL 101 12/23/2011 0830   CO2 20* 01/03/2015 0750   CO2 20* 01/02/2015 0813   CO2 27 12/23/2011 0830   BUN 60* 01/03/2015 0750   BUN 54.9* 01/02/2015 0813   BUN 25*  12/23/2011 0830   CREATININE 3.15* 01/03/2015 0750   CREATININE 3.0* 01/02/2015 0813   CREATININE 2.09* 02/01/2014 1545      Component Value Date/Time   CALCIUM 8.0* 01/03/2015 0750   CALCIUM 8.3* 01/02/2015 0813   CALCIUM 9.0 12/23/2011 0830   ALKPHOS 102 01/02/2015 0813   ALKPHOS 91 12/19/2014 0630   ALKPHOS 76 12/23/2011 0830   AST 8 01/02/2015 0813   AST 11* 12/19/2014 0630   AST 16 12/23/2011 0830   ALT <6 01/02/2015 0813   ALT 7* 12/19/2014 0630   ALT 16 12/23/2011 0830   BILITOT 0.29 01/02/2015 0813   BILITOT 0.6 12/19/2014 0630   BILITOT 0.60 12/23/2011 0830       RADIOGRAPHIC STUDIES: Ct Abdomen Pelvis Wo Contrast  01/03/2015   CLINICAL DATA:  History of metastatic lung cancer to the right sacral ala resulting in obstruction of the right ureter with subsequent ultrasound fluoroscopic guided right-sided percutaneous nephrostomy catheter placement on 11/24/2014.  Patient's nephrostomy  catheter fell out on 07/30 however the patient did not seek immediate attention at that time and returns to the Interventional Radiology department today for attempted conversion of the right-sided nephrostomy catheter to a double-J ureteral stent.  Above was discussed with referring urologist, Dr. Diona Fanti who elected to pursue a noncontrast abdominal CT to evaluate for residual hydronephrosis.  The patient is currently asymptomatic. No flank pain. No fever or chills.  EXAM: CT ABDOMEN AND PELVIS WITHOUT CONTRAST  TECHNIQUE: Multidetector CT imaging of the abdomen and pelvis was performed following the standard protocol without IV contrast.  COMPARISON:  Chest CT - 01/02/2015 ; CT abdomen pelvis - 11/08/2014; ultrasound fluoroscopic guided right-sided percutaneous nephrostomy catheter placement - 11/24/2014  FINDINGS: Grossly unchanged appearance of known infiltrative mass involving the right-sided the sacral ala extending across the right SI joint and cranially to involve both the L4 and L5 vertebral  bodies (representative sagittal image 54, series 603). The mass appears grossly unchanged in size measuring approximately 5.6 x 8.5 cm (image 50, series 2, though there is apparent less mass effect upon the right ureter as there is only a very minimal amount of ureterectasis involving the superior aspect of the right ureter without any significant right-sided pelvicaliectasis.  The previously placed right-sided percutaneous nephrostomy catheter has been removed compatible with provided history.  Grossly unchanged appearance of the approximately 1.7 x 1.6 cm hypo attenuating (-3 Hounsfield unit) partially exophytic lesion arising from the anterior superior aspect of the right kidney (image 19, series 2), incompletely evaluated without intravenous contrast are favored to represent a renal cyst. Unchanged appearance of the approximately 1.0 x 1.4 cm hyper attenuating (51 Hounsfield unit) partially exophytic lesion arising from the superior pole the right kidney which is incompletely characterized though unchanged in favored to represent a hyperdense renal cyst. There is unchanged asymmetric atrophy involving the posterior aspects of the mid and inferior poles of the left kidney. No nephrolithiasis.  Normal hepatic contour post cholecystectomy.  No ascites.  Normal noncontrast appearance of the bilateral adrenal glands, pancreas and spleen incidental note is made of a small splenule.  The bowel is normal in course and caliber without wall thickening. There is mild fluid distention of the cecum and ascending colon without evidence of enteric obstruction. No pneumoperitoneum, pneumatosis or portal venous gas.  Large amount of eccentric calcified atherosclerotic plaque including multiple crescentic intraluminal calcification within the mid aspect of the abdominal aorta (image 31, series 2), unchanged. No abdominal aortic aneurysm.  Isolated enlarged left-sided retroperitoneal lymph node is unchanged, measuring approximate  1.1 cm in greatest short axis diameter (image 26, series 2). No definitive bulky retroperitoneal, mesenteric, pelvic or inguinal lymphadenopathy. The lack of intravenous contrast limits the ability to evaluate solid abdominal organs.  Limited visualization of lower thorax demonstrates minimal dependent subpleural ground-glass atelectasis, right greater than left. Grossly unchanged approximately 1.2 x 0.8 cm nodule within the imaged left lower lobe (image 8, series 4). No pleural effusion.  Normal heart size. Coronary artery calcifications. There is diffuse decreased attenuation of the intracranial blood pool suggestive of anemia.  Re- demonstrated ill-defined subcutaneous thickening about the sacral ala compatible with known decubitus ulcer (representative images 60 and 66, series 2). No subcutaneous edema or radiopaque foreign body. No discrete areas of osteolysis to suggest osteomyelitis though this is not excluded on the basis of this noncontrast abdominal CT.  IMPRESSION: 1. Sequela of inadvertent removal of the right-sided nephrostomy. The patient subsequently underwent fluoroscopic guided nephrostomy catheter replacement (please refer  to separate dictation) 2. Grossly unchanged appearance of known infiltrative mass primarily involving the right sacral ala extending to involve both the L4 and L5 vertebral bodies, however has been interval decrease in mass effect/invasion upon the distal aspect of the right ureter as there is only very minimal right-sided ureterectasis. 3. Stable sequela of asymmetric left-sided renal atrophy. No evidence of left-sided urinary obstruction. 4. Unchanged retroperitoneal adenopathy and presumed metastatic nodule within the imaged left lower lobe. 5. Advanced atherosclerotic disease as detailed above. 6. Grossly unchanged findings of known sacral decubitus ulcer.   Electronically Signed   By: Sandi Mariscal M.D.   On: 01/03/2015 11:05   Ct Chest Wo Contrast  01/02/2015   CLINICAL  DATA:  Restaging right lung cancer.  EXAM: CT CHEST WITHOUT CONTRAST  TECHNIQUE: Multidetector CT imaging of the chest was performed following the standard protocol without IV contrast.  COMPARISON:  11/24/2014  FINDINGS: Mediastinum: The heart size appears normal. There is no pericardial effusion identified. Aortic atherosclerosis noted. Calcifications within the RCA, LAD and left circumflex coronary arteries. The trachea appears patent and is midline. Normal appearance of the esophagus. No mediastinal or hilar adenopathy.  Lungs/Pleura: No pleural effusion. Scarring noted within the posterior right lower lobe. Lingular scarring also noted index nodule within the left upper lobe measures 1.4 by 1.0 cm, image 21/ series 4. Previously 1.2 x 1.1 cm. The left lower lobe pulmonary nodule measures 1.2 x 0.8 cm, image 56/series 4. Previously 1.0 x 0.8 cm.  Upper Abdomen: The visualized portions of the liver are normal. The adrenal glands are unremarkable. Normal appearance of the visualized spleen. Left periaortic lymph node is again noted measuring 0.9 cm, image 70/series 2.  Musculoskeletal: No evidence for thoracic bone metastases.  IMPRESSION: 1. The index nodules within the left lung are stable to slightly increased in size in the interval. 2. Aortic atherosclerosis.   Electronically Signed   By: Kerby Moors M.D.   On: 01/02/2015 09:47   Ir Ureteral Stent Right New Access W/o Sep Nephrostomy Cath  01/03/2015   INDICATION: History of metastatic lung cancer to the right sacral ala resulting in obstruction of the right ureter with subsequent ultrasound and fluoroscopic guided percutaneous nephrostomy catheter placement on 11/04/2014.  The patient's nephrostomy catheter fell out on 07/30, however the patient did not seek immediate attention and returns to the Interventional Radiology department today for attempted conversion of the right-sided nephrostomy catheter to a double-J ureteral stent.  Above was discussed  with referring urologist, Dr. Diona Fanti where it was decided to perform a preprocedural noncontrast abdominal CT to evaluate for any residual or recurrent right-sided hydronephrosis post recent right-sided percutaneous nephrostomy catheter removal.  Review of CT scan performed earlier same day demonstrates a very minimal amount of residual right-sided ureterectasis without recurrence of right-sided pelvicaliectasis, however preprocedural laboratories demonstrate mild increase in creatinine, currently 3. As such, the decision was made to attempt to fluoroscopically re-cannulate the percutaneous nephrostomy catheter track and if successful, to attend placement of a double-J ureteral stent. If the tract does not appear patent, additional attempts at percutaneous nephrostomy catheter placement will not be performed at this time.  EXAM: 1. FLUOROSCOPIC GUIDED RE- CANNULIZATION OF RIGHT PERCUTANEOUS NEPHROSTOMY CATHETER TRACT 2. RIGHT-SIDED ANTEGRADE NEPHROSTOGRAM 3. FLUOROSCOPIC GUIDED CONVERSION OF RIGHT-SIDED PERCUTANEOUS NEPHROSTOMY CATHETER TO A URETERAL STENT. 4. FLUOROSCOPIC GUIDED RIGHT-SIDED PERCUTANEOUS NEPHROSTOMY CATHETER EXCHANGE  COMPARISON:  CT abdomen pelvis- earlier same day ; 11/23/2014; CT-guided right sacral mass biopsy -11/23/2014  CONTRAST:  20  mL Omnipaque 300 administered into the collecting system  FLUOROSCOPY TIME:  4 minutes 48 seconds (24 mGy)  COMPLICATIONS: None immediate  TECHNIQUE: Informed written consent was obtained from the patient after a discussion of the risks, benefits and alternatives to treatment. Questions regarding the procedure were encouraged and answered. A timeout was performed prior to the initiation of the procedure.  The right flank and site of the prior recently removed right nephrostomy catheter ostomy was prepped and draped in the usual sterile fashion. A sterile drape was applied covering the operative field. Maximum barrier sterile technique with sterile gowns and  gloves were used for the procedure. A timeout was performed prior to the initiation of the procedure.  The exit site of the recently removed right nephrostomy catheter ostomy was cannulated with a Christmas tree adapter. Contrast was injected demonstrated patency of the prior nephrostomy catheter track with opacification of the right renal collecting system.  As such, with the use of a regular glidewire, a Kumpe catheter was manipulated through the percutaneous nephrostomy catheter tract to the level of the right renal pelvis. Contrast injection confirmed appropriate positioning.  An antegrade nephrostogram was performed via the Kumpe catheter demonstrating patency of the right ureter to the level of the urinary bladder. As such, the decision was made to attempt placement of a right-sided double-J ureteral stent.  Under intermittent fluoroscopic guidance, the Kumpe catheter was exchanged for a 8-French vascular sheath which was advanced into the renal pelvis and utilized to advance an additional Benson wire into the renal collecting system to be used as a Chiropodist.  The Kumpe catheter was then utilized to estimate to appropriate length of the ureteral stent. An 8-French, 22 cm ureteral stent was then advanced over an Amplatz superstiff guidewire. The distal portion was formed at the level of the bladder. Proximal portion was then formed at the level of the renal collecting system.  At this point, the safety wire was utilized to replace an existing 10.2 [place] percutaneous nephrostomy catheter within the renal collecting system. Postprocedural images were obtained in various obliquities.  The nephrostomy catheter was capped and a dressing was placed. The patient tolerated the procedure well without immediate postprocedural complication.  FINDINGS: Successful fluoroscopic guided re- cannulization of prior right-sided percutaneous nephrostomy catheter tract.  Right-sided antegrade nephrostogram demonstrate patency  of the right ureter to the level of the urinary bladder. No residual right-sided ureterectasis or pelvicaliectasis.  Successful fluoroscopic guided placement of a right-sided double-J ureteral stent with superior coil within the renal pelvis and inferior coil within the urinary bladder.  After successful fluoroscopic guided percutaneous nephrostomy exchange, with the new nephrostomy catheter coiled and locked within the right renal pelvis.  IMPRESSION: 1. Successful fluoroscopic guided recannulization of right-sided percutaneous nephrostomy catheter track. 2. Successful fluoroscopic guided placement of a 8 French, 22 cm ureteral stent with superior coil within the right renal pelvis and inferior coil within the urinary bladder. 3. Successful fluoroscopic guided exchange of right-sided 10.2 French percutaneous nephrostomy catheter.  PLAN: The patient's right-sided percutaneous nephrostomy catheter was capped for a trial of internalization.  The patient was given a gravity bag and instructed to reconnect the nephrostomy catheter to the gravity bag if she were develop recurrent right-sided flank pain and obstructive symptoms. The patient and the patient's family demonstrated good understanding of these instructions.  The patient will return to the interventional radiology department on Monday, 8/8, for repeat right-sided antegrade nephrostogram and potential fluoroscopic guided nephrostomy catheter removal.   Electronically  Signed   By: Sandi Mariscal M.D.   On: 01/03/2015 15:34    ASSESSMENT AND PLAN: This is a very pleasant 81 years old white female with history of stage IV non-small cell lung cancer diagnosed in March of 2007 status post 6 cycles of systemic chemotherapy with carboplatin and docetaxel and has been observation since that time with no evidence for disease progression for almost 9 years.  She was found recently to have evidence for disease recurrence with large lumbar paraspinous mass with osseous  invasion and the core biopsy was consistent with metastatic squamous cell carcinoma.  She is status post palliative radiotherapy to the soft tissue mass in the lumbar area. I had a lengthy discussion with the patient and her family today about her current condition and treatment options. I would consider the patient for treatment with systemic chemotherapy was carboplatin and paclitaxel after improvement of the decubitus ulcer and management of the hydronephrosis and clearance of her infection. For the lumbar decubitus ulcer, I referred the patient to the [facility] for evaluation. She will continue with her current pain medication. For depression and lack of appetite, I started the patient on Remeron 30 mg by mouth daily at bedtime The patient would come back for follow-up visit in 3 weeks for evaluation and discussion of her systemic treatment options after completion of the palliative radiotherapy. The patient was advised to call immediately if she has any concerning symptoms in the interval.  All questions were answered. The patient knows to call the clinic with any problems, questions or concerns. We can certainly see the patient much sooner if necessary.  Disclaimer: This note was dictated with voice recognition software. Similar sounding words can inadvertently be transcribed and may not be corrected upon review.

## 2015-01-09 NOTE — Progress Notes (Signed)
Oncology Nurse Navigator Documentation  Oncology Nurse Navigator Flowsheets 01/09/2015  Navigator Encounter Type Other  Patient Visit Type Medonc  Treatment Phase Other  Barriers/Navigation Needs Financial/I received FMLA papers from daughter.  I gave to Kary Kos in managed care to complete.   Interventions Coordination of Care  Coordination of Care Other  Time Spent with Patient 15

## 2015-01-09 NOTE — Telephone Encounter (Signed)
Gave adn printed appt sched and avs for pt for Aug

## 2015-01-10 ENCOUNTER — Encounter: Payer: Self-pay | Admitting: Internal Medicine

## 2015-01-10 NOTE — Progress Notes (Signed)
I placed fmla forms for sondra-daughter on desk of nurse for dr. Julien Nordmann

## 2015-01-11 ENCOUNTER — Telehealth: Payer: Self-pay | Admitting: *Deleted

## 2015-01-11 ENCOUNTER — Encounter (HOSPITAL_COMMUNITY): Payer: Self-pay | Admitting: Emergency Medicine

## 2015-01-11 ENCOUNTER — Other Ambulatory Visit: Payer: Self-pay | Admitting: *Deleted

## 2015-01-11 ENCOUNTER — Emergency Department (HOSPITAL_COMMUNITY)
Admission: EM | Admit: 2015-01-11 | Discharge: 2015-01-11 | Disposition: A | Discharge status: Home / Self Care | Payer: Medicare Other | Attending: Emergency Medicine | Admitting: Emergency Medicine

## 2015-01-11 DIAGNOSIS — Z8639 Personal history of other endocrine, nutritional and metabolic disease: Secondary | ICD-10-CM | POA: Diagnosis not present

## 2015-01-11 DIAGNOSIS — Z7901 Long term (current) use of anticoagulants: Secondary | ICD-10-CM | POA: Diagnosis not present

## 2015-01-11 DIAGNOSIS — Z79899 Other long term (current) drug therapy: Secondary | ICD-10-CM | POA: Diagnosis not present

## 2015-01-11 DIAGNOSIS — I2510 Atherosclerotic heart disease of native coronary artery without angina pectoris: Secondary | ICD-10-CM | POA: Diagnosis not present

## 2015-01-11 DIAGNOSIS — N189 Chronic kidney disease, unspecified: Secondary | ICD-10-CM | POA: Insufficient documentation

## 2015-01-11 DIAGNOSIS — L89159 Pressure ulcer of sacral region, unspecified stage: Secondary | ICD-10-CM | POA: Diagnosis present

## 2015-01-11 DIAGNOSIS — Z87891 Personal history of nicotine dependence: Secondary | ICD-10-CM | POA: Diagnosis not present

## 2015-01-11 DIAGNOSIS — Z85118 Personal history of other malignant neoplasm of bronchus and lung: Secondary | ICD-10-CM | POA: Diagnosis not present

## 2015-01-11 DIAGNOSIS — Z880 Allergy status to penicillin: Secondary | ICD-10-CM | POA: Insufficient documentation

## 2015-01-11 DIAGNOSIS — I129 Hypertensive chronic kidney disease with stage 1 through stage 4 chronic kidney disease, or unspecified chronic kidney disease: Secondary | ICD-10-CM | POA: Insufficient documentation

## 2015-01-11 DIAGNOSIS — Z862 Personal history of diseases of the blood and blood-forming organs and certain disorders involving the immune mechanism: Secondary | ICD-10-CM | POA: Diagnosis not present

## 2015-01-11 DIAGNOSIS — L89154 Pressure ulcer of sacral region, stage 4: Secondary | ICD-10-CM | POA: Diagnosis not present

## 2015-01-11 DIAGNOSIS — K219 Gastro-esophageal reflux disease without esophagitis: Secondary | ICD-10-CM | POA: Diagnosis not present

## 2015-01-11 LAB — CBC WITH DIFFERENTIAL/PLATELET
Basophils Absolute: 0 10*3/uL (ref 0.0–0.1)
Basophils Relative: 0 % (ref 0–1)
EOS PCT: 1 % (ref 0–5)
Eosinophils Absolute: 0.1 10*3/uL (ref 0.0–0.7)
HCT: 26.4 % — ABNORMAL LOW (ref 36.0–46.0)
Hemoglobin: 8.1 g/dL — ABNORMAL LOW (ref 12.0–15.0)
LYMPHS ABS: 0.4 10*3/uL — AB (ref 0.7–4.0)
LYMPHS PCT: 6 % — AB (ref 12–46)
MCH: 24.8 pg — ABNORMAL LOW (ref 26.0–34.0)
MCHC: 30.7 g/dL (ref 30.0–36.0)
MCV: 80.7 fL (ref 78.0–100.0)
MONOS PCT: 6 % (ref 3–12)
Monocytes Absolute: 0.4 10*3/uL (ref 0.1–1.0)
Neutro Abs: 5.9 10*3/uL (ref 1.7–7.7)
Neutrophils Relative %: 87 % — ABNORMAL HIGH (ref 43–77)
Platelets: 331 10*3/uL (ref 150–400)
RBC: 3.27 MIL/uL — AB (ref 3.87–5.11)
RDW: 16.8 % — AB (ref 11.5–15.5)
WBC: 6.9 10*3/uL (ref 4.0–10.5)

## 2015-01-11 LAB — BASIC METABOLIC PANEL
ANION GAP: 11 (ref 5–15)
BUN: 62 mg/dL — ABNORMAL HIGH (ref 6–20)
CHLORIDE: 101 mmol/L (ref 101–111)
CO2: 23 mmol/L (ref 22–32)
Calcium: 8.2 mg/dL — ABNORMAL LOW (ref 8.9–10.3)
Creatinine, Ser: 3.13 mg/dL — ABNORMAL HIGH (ref 0.44–1.00)
GFR calc Af Amer: 15 mL/min — ABNORMAL LOW (ref >60)
GFR calc non Af Amer: 13 mL/min — ABNORMAL LOW (ref >60)
Glucose, Bld: 118 mg/dL — ABNORMAL HIGH (ref 65–99)
POTASSIUM: 3.5 mmol/L (ref 3.5–5.1)
SODIUM: 135 mmol/L (ref 135–145)

## 2015-01-11 LAB — SEDIMENTATION RATE: SED RATE: 104 mm/h — AB (ref 0–22)

## 2015-01-11 LAB — C-REACTIVE PROTEIN: CRP: 9.7 mg/dL — AB (ref <1.0)

## 2015-01-11 LAB — I-STAT CG4 LACTIC ACID, ED: Lactic Acid, Venous: 1.13 mmol/L (ref 0.5–2.0)

## 2015-01-11 MED ORDER — VENLAFAXINE HCL 37.5 MG PO TABS: 37.5000 mg | ORAL_TABLET | Freq: Two times a day (BID) | ORAL | Status: DC

## 2015-01-11 MED ORDER — HYDROMORPHONE HCL 1 MG/ML IJ SOLN
1.0000 mg | Freq: Once | INTRAMUSCULAR | Status: AC
Start: 2015-01-11 — End: 2015-01-11
  Administered 2015-01-11: 0.5 mg via INTRAVENOUS
  Filled 2015-01-11: qty 1

## 2015-01-11 MED ORDER — SODIUM CHLORIDE 0.9 % IV BOLUS (SEPSIS)
1000.0000 mL | Freq: Once | INTRAVENOUS | Status: AC
  Administered 2015-01-11: 1000 mL via INTRAVENOUS

## 2015-01-11 NOTE — ED Notes (Signed)
Offered ambulance transportation services to family but daughter stated if we could get her in wheelchair and car that she and her husband could get her in the house. No acute distress. Stable upon discharge.

## 2015-01-11 NOTE — ED Notes (Signed)
Daughter states she was advised to bring the patient here by the home health nurse. Complaining of a sacral ulcer that has been getting increasingly worse over the last three months. They state it's been monitored by PCP who was attempting to get them in at the wound clinic, but has not had an appointment there yet. Unable to visualize wound in triage because patient too weak to stand without multiple staff for assistance.

## 2015-01-11 NOTE — Discharge Instructions (Signed)
Pressure Ulcer Follow-up with wound care. Return for fever, chills, surrounding redness or drainage from the site. A pressure ulcer is a sore that has formed from the breakdown of skin and exposure of deeper layers of tissue. It develops in areas of the body where there is unrelieved pressure. Pressure ulcers are usually found over a bony area, such as the shoulder blades, spine, lower back, hips, knees, ankles, and heels. Pressure ulcers vary in severity. Your health care provider may determine the severity (stage) of your pressure ulcer. The stages include:  Stage I--The skin is red, and when the skin is pressed, it stays red.  Stage II--The top layer of skin is gone, and there is a shallow, pink ulcer.  Stage III--The ulcer becomes deeper, and it is more difficult to see the whole wound. Also, there may be yellow or brown parts, as well as pink and red parts.  Stage IV--The ulcer may be deep and red, pink, brown, white, or yellow. Bone or muscle may be seen.  Unstageable pressure ulcer--The ulcer is covered almost completely with black, brown, or yellow tissue. It is not known how deep the ulcer is or what stage it is until this covering comes off.  Suspected deep tissue injury--A person's skin can be injured from pressure or pulling on the skin when his or her position is changed. The skin appears purple or maroon. There may not be an opening in the skin, but there could be a blood-filled blister. This deep tissue injury is often difficult to see in people with darker skin tones. The site may open and become deeper in time. However, early interventions will help the area heal and may prevent the area from opening. CAUSES  Pressure ulcers are caused by pressure against the skin that limits the flow of blood to the skin and nearby tissues. There are many risk factors that can lead to pressure sores. RISK FACTORS  Decreased ability to move.  Decreased ability to feel pain or  discomfort.  Excessive skin moisture from urine, stool, sweat, or secretions.  Poor nutrition.  Dehydration.  Tobacco, drug, or alcohol abuse.  Having someone pull on bedsheets that are under you, such as when health care workers are changing your position in a hospital bed.  Obesity.  Increased adult age.  Hospitalization in a critical care unit for longer than 4 days with use of medical devices.  Prolonged use of medical devices.  Critical illness.  Anemia.  Traumatic brain injury.  Spinal cord injury.  Stroke.  Diabetes.  Poor blood glucose control.  Low blood pressure (hypotension).  Low oxygen levels.  Medicines that reduce blood flow.  Infection. DIAGNOSIS  Your health care provider will diagnose your pressure ulcer based on its appearance. The health care provider may determine the stage of your pressure ulcer as well. Tests may be done to check for infection, to assess your circulation, or to check for other diseases, such as diabetes. TREATMENT  Treatment of your pressure ulcer begins with determining what stage the ulcer is in. Your treatment team may include your health care provider, a wound care specialist, a nutritionist, a physical therapist, and a Psychologist, sport and exercise. Possible treatments may include:   Moving or repositioning every 1-2 hours.  Using beds or mattresses to shift your body weight and pressure points frequently.  Improving your diet.  Cleaning and bandaging (dressing) the open wound.  Giving antibiotic medicines.  Removing damaged tissue.  Surgery and sometimes skin grafts. HOME CARE INSTRUCTIONS  If you were hospitalized, follow the care plan that was started in the hospital.  Avoid staying in the same position for more than 2 hours. Use padding, devices, or mattresses to cushion your pressure points as directed by your health care provider.  Eat a well-balanced diet. Take nutritional supplements and vitamins as directed by your  health care provider.  Keep all follow-up appointments.  Only take over-the-counter or prescription medicines for pain, fever, or discomfort as directed by your health care provider. SEEK MEDICAL CARE IF:   Your pressure ulcer is not improving.  You do not know how to care for your pressure ulcer.  You notice other areas of redness on your skin.  You have a fever. SEEK IMMEDIATE MEDICAL CARE IF:   You have increasing redness, swelling, or pain in your pressure ulcer.  You notice pus coming from your pressure ulcer.  You notice a bad smell coming from the wound or dressing.  Your pressure ulcer opens up again. Document Released: 05/20/2005 Document Revised: 05/25/2013 Document Reviewed: 01/25/2013 [facility] Patient Information 2015 [place], [place]. This information is not intended to replace advice given to you by your health care provider. Make sure you discuss any questions you have with your health care provider.

## 2015-01-11 NOTE — ED Provider Notes (Signed)
CSN: [id number]     Arrival date & time 01/11/15  1257 History   First MD Initiated Contact with Patient 01/11/15 1314     Chief Complaint  Patient presents with  . Skin Ulcer     (Consider location/radiation/quality/duration/timing/severity/associated sxs/prior Treatment) The history is provided by the patient and a relative. No language interpreter was used.  [name] is a 81 year old female with a history of HTN, hyperlipidemia, CKD, CAD, GERD, and lung cancer who presents for decubitus ulcer over the sacrum which has been worsening over the past 3 months. Daughter states that they were seen by Dr. Inda Merlin 2 days ago for the ulcer and he was to refer them for wound care but no one has called from the office. Home health nurse came by today and stated that the patient needed to go to the hospital. Patient's pain is 8/10 now. Last radiation for lung cancer that has spread to the spine was on 12/22/2014. She is currently not on chemotherapy but is scheduled for chemotherapy after wound has healed. Patient states she called Dr. Arvilla Market office who stated they were still working on wound management referral. She denies any fever, chills. Oncology: Dr. Inda Merlin Past Medical History  Diagnosis Date  . Hypertension   . Hypercholesterolemia   . CKD (chronic kidney disease)   . Coronary artery disease     a.  LHC (06/04/05): LHC done in [place] with high grade RCA => s/p BMS to RCA;  b.  Nuclear (09/14/09): Lexiscan; Inf infarct with mild peri-infarct ishemia, EF 52%; Low Risk.  Marland Kitchen GERD (gastroesophageal reflux disease)   . Chronic anemia   . Ischemic cardiomyopathy     a. Echo (07/26/13): Mild LVH, EF 35-40%, diff HK, inf AK, Gr 2 DD, Tr AI, mildly dilated Ao root, MAC, mild MR, mild LAE, mod reduced RVSF.  . Non-small cell carcinoma of lung     Stage IV   Past Surgical History  Procedure Laterality Date  . Hematoma evacuation  December 2006    groin  . Appendectomy    . Cataract  extraction    . Hemorroidectomy    . Laminectomy     Family History  Problem Relation Age of Onset  . Heart failure Mother   . Lung cancer Sister   . Lung cancer Brother   . Stomach cancer Brother   . Heart attack Neg Hx   . Stroke Neg Hx    Social History  Substance Use Topics  . Smoking status: Former Smoker    Types: Cigarettes  . Smokeless tobacco: None  . Alcohol Use: No   OB History    No data available     Review of Systems  Constitutional: Negative for fever.  Skin: Positive for wound.  All other systems reviewed and are negative.     Allergies  Amlodipine; Ciprofloxacin; Mirtazapine; Statins; Aspirin; Cephalexin; Hydralazine; Iron; Sulfonamide derivatives; Penicillins; and Tape  Home Medications   Prior to Admission medications   Medication Sig Start Date End Date Taking? Authorizing Provider  cloNIDine (CATAPRES) 0.2 MG tablet Take 1 tablet (0.2 mg total) by mouth 3 (three) times daily. 11/29/14  Yes Shanker Kristeen Mans, MD  enoxaparin (LOVENOX) 80 MG/0.8ML injection Inject 0.8 mLs (80 mg total) into the skin daily. 11/29/14  Yes Shanker Kristeen Mans, MD  furosemide (LASIX) 40 MG tablet Take 1 tablet (40 mg total) by mouth daily. 02/03/14  Yes Sherren Mocha, MD  HYDROcodone-acetaminophen (NORCO/VICODIN) 5-325 MG per tablet Take 1 tablet  by mouth every 6 (six) hours as needed for moderate pain. Max APAP 3gm/24 hrs from all sources 11/30/14  Yes Estill Dooms, MD  loperamide (IMODIUM A-D) 2 MG tablet Take 2 mg by mouth 4 (four) times daily as needed for diarrhea or loose stools (diarrhea).   Yes Historical Provider, MD  LORazepam (ATIVAN) 1 MG tablet Take one tablet by mouth every 8 hours for anxiety Patient taking differently: Take 1 mg by mouth every 8 (eight) hours as needed for anxiety. Take one tablet by mouth every 8 hours for anxiety 11/30/14  Yes Gildardo Cranker, DO  metoprolol succinate (TOPROL-XL) 25 MG 24 hr tablet Take 1 tablet (25 mg total) by mouth daily.  11/29/14  Yes Shanker Kristeen Mans, MD  pantoprazole (PROTONIX) 40 MG tablet TAKE ONE TABLET BY MOUTH ONCE DAILY. 10/14/14  Yes Sherren Mocha, MD  polyethylene glycol [facility] / GLYCOLAX) packet Take 17 g by mouth daily. Patient taking differently: Take 17 g by mouth daily as needed for moderate constipation.  11/29/14  Yes Shanker Kristeen Mans, MD  SANTYL ointment Apply 1 application topically at bedtime as needed. For bed sores 12/27/14  Yes Historical Provider, MD  venlafaxine (EFFEXOR) 37.5 MG tablet Take 1 tablet (37.5 mg total) by mouth 2 (two) times daily. 01/11/15   Curt Bears, MD   BP 133/44 mmHg  Pulse 65  Temp(Src) 97.5 F (36.4 C) (Oral)  Resp 18  SpO2 100% Physical Exam  Constitutional: She is oriented to person, place, and time. She appears well-developed and well-nourished. No distress.  HENT:  Head: Normocephalic and atraumatic.  Eyes: Conjunctivae are normal.  Neck: Neck supple.  Cardiovascular: Normal rate, regular rhythm and normal heart sounds.   Pulmonary/Chest: Effort normal and breath sounds normal.  Abdominal: Soft. There is no tenderness.  Musculoskeletal:  Nonambulatory at baseline.  Neurological: She is alert and oriented to person, place, and time.  Skin: Skin is dry. She is not diaphoretic.  Full-thickness ulceration over the sacrum down to the bone. No drainage or surrounding erythema.  Psychiatric: She has a normal mood and affect. Her behavior is normal.    ED Course  Procedures (including critical care time) Labs Review Labs Reviewed  CBC WITH DIFFERENTIAL/PLATELET - Abnormal; Notable for the following:    RBC 3.27 (*)    Hemoglobin 8.1 (*)    HCT 26.4 (*)    MCH 24.8 (*)    RDW 16.8 (*)    Neutrophils Relative % 87 (*)    Lymphocytes Relative 6 (*)    Lymphs Abs 0.4 (*)    All other components within normal limits  BASIC METABOLIC PANEL - Abnormal; Notable for the following:    Glucose, Bld 118 (*)    BUN 62 (*)    Creatinine, Ser 3.13 (*)     Calcium 8.2 (*)    GFR calc non Af Amer 13 (*)    GFR calc Af Amer 15 (*)    All other components within normal limits  C-REACTIVE PROTEIN - Abnormal; Notable for the following:    CRP 9.7 (*)    All other components within normal limits  SEDIMENTATION RATE - Abnormal; Notable for the following:    Sed Rate 104 (*)    All other components within normal limits  CULTURE, BLOOD (ROUTINE X 2)  CULTURE, BLOOD (ROUTINE X 2)  I-STAT CG4 LACTIC ACID, ED    Imaging Review No results found.   EKG Interpretation None  MDM   Final diagnoses:  Decubitus ulcer of sacral area, stage IV   Patient presents for chronic skin ulcer for the past 3 months. She has wound care follow-up by Dr. Earlie Server, her oncologist, who is aware of the wound. Vital stable. Lactic acid is normal. She does not have leukocytosis. She is well-appearing and in no acute distress.  CT abdomen done 5 days ago shows no osteomyelitis.  I am not concerned about infection at this time. I discussed calling the wound care center or Dr. Arvilla Market office tomorrow.  I also discussed return precautions and patient/daughter both agree with the plan.      Ottie Glazier, PA-C 01/12/15 0559  Forde Dandy, MD 01/12/15 [phone]

## 2015-01-11 NOTE — Telephone Encounter (Signed)
VM message received from patient regarding new prescription and wound care center referral. Pt states she does not feel well at all. Attempted call back x2 - phone line busy. Unable to leave message. Will keep trying to call patient.

## 2015-01-11 NOTE — Telephone Encounter (Signed)
Called pt's daughter discussed Wound care consult was put in by MD. Remeron d/c by MD as this caused pt to hallucinate, Rx for effexor 37.'5mg'$  called into pt pharmacy. Advised daughter to call Urologist concerning request for antibiotic for wound. Daughter states " ok thank youl, we are leaving the ED now." No further concerns.

## 2015-01-11 NOTE — Telephone Encounter (Signed)
TC to patient -spoke with pt's daughter and the patient. She states she cannot take the medicine ordered for appetite (Remeron). It is listed as an allergy. Apparently she hallucinated when taking it and becomes verbally aggressive. Daughter confirmed this.  She will need a different medication for her appetite.  Also pt is asking if she needs an antibiotic for 'infection'. She is concerned that there is still an active infection related to her nephrostomy and her stents.   Patient states she is in awful pain d/t decubitus ulcer and wants to know when she will be seen at the [facility]. Pt has home health nurse and she is present in the home at this time. The nurse states that sacral ulcer is quite deep and needs attention and can we please expedite the [facility] referral.

## 2015-01-12 ENCOUNTER — Encounter: Payer: Self-pay | Admitting: Internal Medicine

## 2015-01-12 NOTE — Telephone Encounter (Signed)
TC to [facility] @ [id number], spoke with Shanon Brow regarding referral for pt's sacral wound. He will call pt/family to set up appt.

## 2015-01-12 NOTE — Progress Notes (Signed)
I called and spoke with [name] and she said to fax to [facility] and mail copy to patient's address.  I faxed to [phone]

## 2015-01-16 LAB — CULTURE, BLOOD (ROUTINE X 2)
CULTURE: NO GROWTH
Culture: NO GROWTH

## 2015-01-17 ENCOUNTER — Ambulatory Visit (INDEPENDENT_AMBULATORY_CARE_PROVIDER_SITE_OTHER): Payer: Medicare Other | Admitting: Urology

## 2015-01-17 DIAGNOSIS — N1330 Unspecified hydronephrosis: Secondary | ICD-10-CM

## 2015-01-17 DIAGNOSIS — N3020 Other chronic cystitis without hematuria: Secondary | ICD-10-CM | POA: Diagnosis not present

## 2015-01-17 DIAGNOSIS — R1900 Intra-abdominal and pelvic swelling, mass and lump, unspecified site: Secondary | ICD-10-CM

## 2015-01-25 ENCOUNTER — Ambulatory Visit (HOSPITAL_BASED_OUTPATIENT_CLINIC_OR_DEPARTMENT_OTHER): Payer: Medicare Other | Admitting: Internal Medicine

## 2015-01-25 ENCOUNTER — Other Ambulatory Visit (HOSPITAL_BASED_OUTPATIENT_CLINIC_OR_DEPARTMENT_OTHER): Payer: Medicare Other

## 2015-01-25 ENCOUNTER — Encounter: Payer: Self-pay | Admitting: Internal Medicine

## 2015-01-25 VITALS — BP 134/82 | HR 89 | Temp 98.4°F | Resp 18 | Ht 68.5 in | Wt 154.0 lb

## 2015-01-25 DIAGNOSIS — Z85118 Personal history of other malignant neoplasm of bronchus and lung: Secondary | ICD-10-CM

## 2015-01-25 DIAGNOSIS — F329 Major depressive disorder, single episode, unspecified: Secondary | ICD-10-CM | POA: Diagnosis not present

## 2015-01-25 DIAGNOSIS — L89899 Pressure ulcer of other site, unspecified stage: Secondary | ICD-10-CM | POA: Diagnosis not present

## 2015-01-25 DIAGNOSIS — C7951 Secondary malignant neoplasm of bone: Secondary | ICD-10-CM

## 2015-01-25 DIAGNOSIS — C3490 Malignant neoplasm of unspecified part of unspecified bronchus or lung: Secondary | ICD-10-CM

## 2015-01-25 DIAGNOSIS — C3492 Malignant neoplasm of unspecified part of left bronchus or lung: Secondary | ICD-10-CM

## 2015-01-25 DIAGNOSIS — R630 Anorexia: Secondary | ICD-10-CM

## 2015-01-25 LAB — CBC WITH DIFFERENTIAL/PLATELET
BASO%: 1 % (ref 0.0–2.0)
Basophils Absolute: 0.1 10*3/uL (ref 0.0–0.1)
EOS%: 1.5 % (ref 0.0–7.0)
Eosinophils Absolute: 0.1 10*3/uL (ref 0.0–0.5)
HEMATOCRIT: 27.9 % — AB (ref 34.8–46.6)
HEMOGLOBIN: 8.7 g/dL — AB (ref 11.6–15.9)
LYMPH#: 0.6 10*3/uL — AB (ref 0.9–3.3)
LYMPH%: 11 % — ABNORMAL LOW (ref 14.0–49.7)
MCH: 24.9 pg — ABNORMAL LOW (ref 25.1–34.0)
MCHC: 31.3 g/dL — ABNORMAL LOW (ref 31.5–36.0)
MCV: 79.5 fL (ref 79.5–101.0)
MONO#: 0.5 10*3/uL (ref 0.1–0.9)
MONO%: 8 % (ref 0.0–14.0)
NEUT%: 78.5 % — ABNORMAL HIGH (ref 38.4–76.8)
NEUTROS ABS: 4.6 10*3/uL (ref 1.5–6.5)
PLATELETS: 317 10*3/uL (ref 145–400)
RBC: 3.5 10*6/uL — ABNORMAL LOW (ref 3.70–5.45)
RDW: 19.3 % — AB (ref 11.2–14.5)
WBC: 5.8 10*3/uL (ref 3.9–10.3)

## 2015-01-25 LAB — COMPREHENSIVE METABOLIC PANEL (CC13)
ALBUMIN: 2.2 g/dL — AB (ref 3.5–5.0)
ALT: 8 U/L (ref 0–55)
ANION GAP: 13 meq/L — AB (ref 3–11)
AST: 10 U/L (ref 5–34)
Alkaline Phosphatase: 111 U/L (ref 40–150)
BILIRUBIN TOTAL: 0.34 mg/dL (ref 0.20–1.20)
BUN: 44.1 mg/dL — AB (ref 7.0–26.0)
CALCIUM: 8.2 mg/dL — AB (ref 8.4–10.4)
CHLORIDE: 102 meq/L (ref 98–109)
CO2: 25 mEq/L (ref 22–29)
CREATININE: 3.1 mg/dL — AB (ref 0.6–1.1)
EGFR: 14 mL/min/{1.73_m2} — ABNORMAL LOW (ref >90)
Glucose: 93 mg/dl (ref 70–140)
Potassium: 4 mEq/L (ref 3.5–5.1)
Sodium: 139 mEq/L (ref 136–145)
TOTAL PROTEIN: 6.3 g/dL — AB (ref 6.4–8.3)

## 2015-01-25 MED ORDER — PROCHLORPERAZINE MALEATE 10 MG PO TABS: 10.0000 mg | ORAL_TABLET | Freq: Four times a day (QID) | ORAL | Status: DC | PRN

## 2015-01-25 NOTE — Progress Notes (Signed)
[facility] [phone]   Fax:([phone]) Byron Muscoda, MD  [place] [address]  PRINCIPAL DIAGNOSIS: Stage IV non-small cell lung cancer diagnosed in March 2007, with disease recurrence in June 2016 with positive PDL 1 expression.   PRIOR THERAPY:  1) Status post 6 cycles of systemic chemotherapy with carboplatin and docetaxel. Last dose was given January 16, 2006.  2) Palliative radiotherapy to the large right paraspinous mass with osseous invasion centered at L5 under the care of Dr. Valere Dross.  CURRENT THERAPY: Systemic chemotherapy with carboplatin for AUC of 5 and paclitaxel 175 MG/M2 every 3 weeks with Neulasta support. First dose expected on 02/02/2015.  DISEASE STAGE: Stage IV non-small cell lung cancer diagnosed in March of 2007.  CHEMOTHERAPY INTENT: Palliative  CURRENT # OF CHEMOTHERAPY CYCLES: 1  CURRENT ANTIEMETICS: None  CURRENT SMOKING STATUS: Nonsmoker  ORAL CHEMOTHERAPY AND CONSENT: None  CURRENT BISPHOSPHONATES USE: None  LIVING WILL AND CODE STATUS: Full code  INTERVAL HISTORY: [name] 81 y.o. female returns to the clinic today for followup visit accompanied by her daughter and grandson. The patient continues to complain of increasing fatigue and weakness as well as weight loss but she gained few pounds recently. Her nephrostomy tube was removed and the patient is currently on Macrobid for urinary tract infection prophylaxis. She is followed by urology. She also has a nonhealing decubitus ulcer and she would like referral to a wound clinic and scheduled to see them tomorrow. She denied having any significant pain in the back or lower extremities. She denied having any significant chest pain, shortness of breath, cough or hemoptysis. She has no nausea or vomiting. She denied having any significant fever or chills. She is here today for evaluation and discussion of her treatment  options.  MEDICAL HISTORY: Past Medical History  Diagnosis Date  . Hypertension   . Hypercholesterolemia   . CKD (chronic kidney disease)   . Coronary artery disease     a.  LHC (06/04/05): LHC done in [place] with high grade RCA => s/p BMS to RCA;  b.  Nuclear (09/14/09): Lexiscan; Inf infarct with mild peri-infarct ishemia, EF 52%; Low Risk.  Marland Kitchen GERD (gastroesophageal reflux disease)   . Chronic anemia   . Ischemic cardiomyopathy     a. Echo (07/26/13): Mild LVH, EF 35-40%, diff HK, inf AK, Gr 2 DD, Tr AI, mildly dilated Ao root, MAC, mild MR, mild LAE, mod reduced RVSF.  . Non-small cell carcinoma of lung     Stage IV    ALLERGIES:  is allergic to amlodipine; ciprofloxacin; mirtazapine; statins; aspirin; cephalexin; hydralazine; iron; sulfonamide derivatives; penicillins; and tape.  MEDICATIONS:  Current Outpatient Prescriptions  Medication Sig Dispense Refill  . cloNIDine (CATAPRES) 0.2 MG tablet Take 1 tablet (0.2 mg total) by mouth 3 (three) times daily.    Marland Kitchen enoxaparin (LOVENOX) 80 MG/0.8ML injection Inject 0.8 mLs (80 mg total) into the skin daily.    . furosemide (LASIX) 40 MG tablet Take 1 tablet (40 mg total) by mouth daily. 90 tablet 3  . HYDROcodone-acetaminophen (NORCO/VICODIN) 5-325 MG per tablet Take 1 tablet by mouth every 6 (six) hours as needed for moderate pain. Max APAP 3gm/24 hrs from all sources 120 tablet 0  . loperamide (IMODIUM A-D) 2 MG tablet Take 2 mg by mouth 4 (four) times daily as needed for diarrhea or loose stools (diarrhea).    . LORazepam (  ATIVAN) 1 MG tablet Take one tablet by mouth every 8 hours for anxiety (Patient taking differently: Take 1 mg by mouth every 8 (eight) hours as needed for anxiety. Take one tablet by mouth every 8 hours for anxiety) 90 tablet 5  . metoprolol succinate (TOPROL-XL) 25 MG 24 hr tablet Take 1 tablet (25 mg total) by mouth daily.    . pantoprazole (PROTONIX) 40 MG tablet TAKE ONE TABLET BY MOUTH ONCE DAILY. 90  tablet 1  . polyethylene glycol (MIRALAX / GLYCOLAX) packet Take 17 g by mouth daily. (Patient taking differently: Take 17 g by mouth daily as needed for moderate constipation. )    . SANTYL ointment Apply 1 application topically at bedtime as needed. For bed sores    . venlafaxine (EFFEXOR) 37.5 MG tablet Take 1 tablet (37.5 mg total) by mouth 2 (two) times daily. 60 tablet 0   No current facility-administered medications for this visit.    SURGICAL HISTORY:  Past Surgical History  Procedure Laterality Date  . Hematoma evacuation  December 2006    groin  . Appendectomy    . Cataract extraction    . Hemorroidectomy    . Laminectomy      REVIEW OF SYSTEMS:  Constitutional: positive for fatigue and weight loss Eyes: negative Ears, nose, mouth, throat, and face: negative Respiratory: negative Cardiovascular: negative Gastrointestinal: negative Genitourinary:positive for frequency Integument/breast: negative Hematologic/lymphatic: negative Musculoskeletal:positive for muscle weakness Neurological: negative Behavioral/Psych: negative Endocrine: negative Allergic/Immunologic: negative   PHYSICAL EXAMINATION: General appearance: alert, cooperative and no distress Head: Normocephalic, without obvious abnormality, atraumatic Neck: no adenopathy Lymph nodes: Cervical, supraclavicular, and axillary nodes normal. Resp: clear to auscultation bilaterally Back: symmetric, no curvature. ROM normal. No CVA tenderness., Decubitus ulcer in the lower back Cardio: regular rate and rhythm, S1, S2 normal, no murmur, click, rub or gallop GI: soft, non-tender; bowel sounds normal; no masses,  no organomegaly Extremities: extremities normal, atraumatic, no cyanosis or edema Neurologic: Alert and oriented X 3, normal strength and tone. Normal symmetric reflexes. Normal coordination and gait  ECOG PERFORMANCE STATUS: 2 - Symptomatic, <50% confined to bed  There were no vitals taken for this  visit.  LABORATORY DATA: Lab Results  Component Value Date   WBC 5.8 01/25/2015   HGB 8.7* 01/25/2015   HCT 27.9* 01/25/2015   MCV 79.5 01/25/2015   PLT 317 01/25/2015      Chemistry      Component Value Date/Time   NA 139 01/25/2015 1511   NA 135 01/11/2015 1401   NA 143 12/23/2011 0830   K 4.0 01/25/2015 1511   K 3.5 01/11/2015 1401   K 4.7 12/23/2011 0830   CL 101 01/11/2015 1401   CL 104 06/19/2012 0958   CL 101 12/23/2011 0830   CO2 25 01/25/2015 1511   CO2 23 01/11/2015 1401   CO2 27 12/23/2011 0830   BUN 44.1* 01/25/2015 1511   BUN 62* 01/11/2015 1401   BUN 25* 12/23/2011 0830   CREATININE 3.1* 01/25/2015 1511   CREATININE 3.13* 01/11/2015 1401   CREATININE 2.09* 02/01/2014 1545      Component Value Date/Time   CALCIUM 8.2* 01/25/2015 1511   CALCIUM 8.2* 01/11/2015 1401   CALCIUM 9.0 12/23/2011 0830   ALKPHOS 111 01/25/2015 1511   ALKPHOS 91 12/19/2014 0630   ALKPHOS 76 12/23/2011 0830   AST 10 01/25/2015 1511   AST 11* 12/19/2014 0630   AST 16 12/23/2011 0830   ALT 8 01/25/2015 1511   ALT  7* 12/19/2014 0630   ALT 16 12/23/2011 0830   BILITOT 0.34 01/25/2015 1511   BILITOT 0.6 12/19/2014 0630   BILITOT 0.60 12/23/2011 0830       RADIOGRAPHIC STUDIES: Ct Abdomen Pelvis Wo Contrast  01/03/2015   CLINICAL DATA:  History of metastatic lung cancer to the right sacral ala resulting in obstruction of the right ureter with subsequent ultrasound fluoroscopic guided right-sided percutaneous nephrostomy catheter placement on 11/24/2014.  Patient's nephrostomy catheter fell out on 07/30 however the patient did not seek immediate attention at that time and returns to the Interventional Radiology department today for attempted conversion of the right-sided nephrostomy catheter to a double-J ureteral stent.  Above was discussed with referring urologist, Dr. Diona Fanti who elected to pursue a noncontrast abdominal CT to evaluate for residual hydronephrosis.  The patient  is currently asymptomatic. No flank pain. No fever or chills.  EXAM: CT ABDOMEN AND PELVIS WITHOUT CONTRAST  TECHNIQUE: Multidetector CT imaging of the abdomen and pelvis was performed following the standard protocol without IV contrast.  COMPARISON:  Chest CT - 01/02/2015 ; CT abdomen pelvis - 11/08/2014; ultrasound fluoroscopic guided right-sided percutaneous nephrostomy catheter placement - 11/24/2014  FINDINGS: Grossly unchanged appearance of known infiltrative mass involving the right-sided the sacral ala extending across the right SI joint and cranially to involve both the L4 and L5 vertebral bodies (representative sagittal image 54, series 603). The mass appears grossly unchanged in size measuring approximately 5.6 x 8.5 cm (image 50, series 2, though there is apparent less mass effect upon the right ureter as there is only a very minimal amount of ureterectasis involving the superior aspect of the right ureter without any significant right-sided pelvicaliectasis.  The previously placed right-sided percutaneous nephrostomy catheter has been removed compatible with provided history.  Grossly unchanged appearance of the approximately 1.7 x 1.6 cm hypo attenuating (-3 Hounsfield unit) partially exophytic lesion arising from the anterior superior aspect of the right kidney (image 19, series 2), incompletely evaluated without intravenous contrast are favored to represent a renal cyst. Unchanged appearance of the approximately 1.0 x 1.4 cm hyper attenuating (51 Hounsfield unit) partially exophytic lesion arising from the superior pole the right kidney which is incompletely characterized though unchanged in favored to represent a hyperdense renal cyst. There is unchanged asymmetric atrophy involving the posterior aspects of the mid and inferior poles of the left kidney. No nephrolithiasis.  Normal hepatic contour post cholecystectomy.  No ascites.  Normal noncontrast appearance of the bilateral adrenal glands,  pancreas and spleen incidental note is made of a small splenule.  The bowel is normal in course and caliber without wall thickening. There is mild fluid distention of the cecum and ascending colon without evidence of enteric obstruction. No pneumoperitoneum, pneumatosis or portal venous gas.  Large amount of eccentric calcified atherosclerotic plaque including multiple crescentic intraluminal calcification within the mid aspect of the abdominal aorta (image 31, series 2), unchanged. No abdominal aortic aneurysm.  Isolated enlarged left-sided retroperitoneal lymph node is unchanged, measuring approximate 1.1 cm in greatest short axis diameter (image 26, series 2). No definitive bulky retroperitoneal, mesenteric, pelvic or inguinal lymphadenopathy. The lack of intravenous contrast limits the ability to evaluate solid abdominal organs.  Limited visualization of lower thorax demonstrates minimal dependent subpleural ground-glass atelectasis, right greater than left. Grossly unchanged approximately 1.2 x 0.8 cm nodule within the imaged left lower lobe (image 8, series 4). No pleural effusion.  Normal heart size. Coronary artery calcifications. There is diffuse decreased attenuation of  the intracranial blood pool suggestive of anemia.  Re- demonstrated ill-defined subcutaneous thickening about the sacral ala compatible with known decubitus ulcer (representative images 60 and 66, series 2). No subcutaneous edema or radiopaque foreign body. No discrete areas of osteolysis to suggest osteomyelitis though this is not excluded on the basis of this noncontrast abdominal CT.  IMPRESSION: 1. Sequela of inadvertent removal of the right-sided nephrostomy. The patient subsequently underwent fluoroscopic guided nephrostomy catheter replacement (please refer to separate dictation) 2. Grossly unchanged appearance of known infiltrative mass primarily involving the right sacral ala extending to involve both the L4 and L5 vertebral  bodies, however has been interval decrease in mass effect/invasion upon the distal aspect of the right ureter as there is only very minimal right-sided ureterectasis. 3. Stable sequela of asymmetric left-sided renal atrophy. No evidence of left-sided urinary obstruction. 4. Unchanged retroperitoneal adenopathy and presumed metastatic nodule within the imaged left lower lobe. 5. Advanced atherosclerotic disease as detailed above. 6. Grossly unchanged findings of known sacral decubitus ulcer.   Electronically Signed   By: Sandi Mariscal M.D.   On: 01/03/2015 11:05   Ct Chest Wo Contrast  01/02/2015   CLINICAL DATA:  Restaging right lung cancer.  EXAM: CT CHEST WITHOUT CONTRAST  TECHNIQUE: Multidetector CT imaging of the chest was performed following the standard protocol without IV contrast.  COMPARISON:  11/24/2014  FINDINGS: Mediastinum: The heart size appears normal. There is no pericardial effusion identified. Aortic atherosclerosis noted. Calcifications within the RCA, LAD and left circumflex coronary arteries. The trachea appears patent and is midline. Normal appearance of the esophagus. No mediastinal or hilar adenopathy.  Lungs/Pleura: No pleural effusion. Scarring noted within the posterior right lower lobe. Lingular scarring also noted index nodule within the left upper lobe measures 1.4 by 1.0 cm, image 21/ series 4. Previously 1.2 x 1.1 cm. The left lower lobe pulmonary nodule measures 1.2 x 0.8 cm, image 56/series 4. Previously 1.0 x 0.8 cm.  Upper Abdomen: The visualized portions of the liver are normal. The adrenal glands are unremarkable. Normal appearance of the visualized spleen. Left periaortic lymph node is again noted measuring 0.9 cm, image 70/series 2.  Musculoskeletal: No evidence for thoracic bone metastases.  IMPRESSION: 1. The index nodules within the left lung are stable to slightly increased in size in the interval. 2. Aortic atherosclerosis.   Electronically Signed   By: Kerby Moors  M.D.   On: 01/02/2015 09:47   Ir Nephrostogram Right Thru Existing Access  01/09/2015   CLINICAL DATA:  History of metastatic lung cancer to the right sacral ala resulting in obstruction of the right ureter, post ultrasound fluoroscopic guided percutaneous nephrostomy catheter placement on 11/04/2014.  The patient subsequent underwent successful fluoroscopic guided conversion of the right-sided nephrostomy to a right-sided double-J ureteral stent on 01/23/2015 with subsequent capping of the right-sided percutaneous nephrostomy catheter.  The patient has passed the trial of internalization and returns today for repeat right-sided antegrade nephrostogram and fluoroscopic guided removal of the right-sided nephrostomy catheter.  EXAM: IR NEPHROSTOGRAM EXISTING ACCESS RIGHT  CONTRAST:  10 cc Omnipaque 300, administered into the right renal collecting system.  FLUOROSCOPY TIME:  30 seconds (5 mGy).  COMPARISON:  CT abdomen pelvis - 11/23/2014; 8/ 07/2014; ultrasound fluoroscopic guided right-sided percutaneous nephrostomy catheter placement - 01/24/2015; fluoroscopic guided conversion of right-sided percutaneous nephrostomy catheter to a right-sided double-J ureteral stent -01/23/2015  TECHNIQUE: The patient was positioned prone on the fluoroscopy table. A spot fluoroscopic image was obtained of the  existing right-sided percutaneous nephrostomy catheter and the right-sided double-J ureteral stent.  Multiple spot fluoroscopic images were obtained following the injection of a small amount of contrast via the right-sided percutaneous nephrostomy catheter.  The external portion of the right-sided percutaneous nephrostomy catheter was cut and cannulated with a short Amplatz wire. Under intermittent fluoroscopic guidance, the percutaneous nephrostomy catheter was removed. A post removal spot fluoroscopic image was obtained demonstrating unchanged positioning of the right-sided double-J ureteral stent. A dressing was placed.  The patient tolerated the procedure well and immediate postprocedural complication.  FINDINGS: Preprocedural spot fluoroscopic image demonstrates unchanged positioning of the right-sided percutaneous nephrostomy catheter as well as the right-sided double-J ureteral stent with superior coil overlying the right renal pelvis and inferior coil overlying the expected location of the urinary bladder.  Contrast injection demonstrates appropriate positioning and functionality of right-sided percutaneous nephrostomy catheter as well as wide patency of the right-sided double-J ureteral stent with brisk passage of contrast through the stent to the level of the urinary bladder. No evidence of right-sided pelvicaliectasis.  Successful fluoroscopic guided removal of the percutaneous nephrostomy catheter with unchanged positioning of the right-sided double-J ureteral stent.  IMPRESSION: 1. Successful fluoroscopic guided removal of right-sided nephrostomy catheter. 2. Appropriate positioning and functionality of the right-sided double-J ureteral stent.  PLAN: The patient should follow-up with Dr. Diona Fanti (Urology) regarding subsequent right-sided double-J ureteral stent exchanges.   Electronically Signed   By: Sandi Mariscal M.D.   On: 01/09/2015 11:46   Ir Ureteral Stent Placement Existing Access Right  01/03/2015   INDICATION: History of metastatic lung cancer to the right sacral ala resulting in obstruction of the right ureter with subsequent ultrasound and fluoroscopic guided percutaneous nephrostomy catheter placement on 11/04/2014.  The patient's nephrostomy catheter fell out on 07/30, however the patient did not seek immediate attention and returns to the Interventional Radiology department today for attempted conversion of the right-sided nephrostomy catheter to a double-J ureteral stent.  Above was discussed with referring urologist, Dr. Diona Fanti where it was decided to perform a preprocedural noncontrast abdominal CT to  evaluate for any residual or recurrent right-sided hydronephrosis post recent right-sided percutaneous nephrostomy catheter removal.  Review of CT scan performed earlier same day demonstrates a very minimal amount of residual right-sided ureterectasis without recurrence of right-sided pelvicaliectasis, however preprocedural laboratories demonstrate mild increase in creatinine, currently 3. As such, the decision was made to attempt to fluoroscopically re-cannulate the percutaneous nephrostomy catheter track and if successful, to attend placement of a double-J ureteral stent. If the tract does not appear patent, additional attempts at percutaneous nephrostomy catheter placement will not be performed at this time.  EXAM: 1. FLUOROSCOPIC GUIDED RE- CANNULIZATION OF RIGHT PERCUTANEOUS NEPHROSTOMY CATHETER TRACT 2. RIGHT-SIDED ANTEGRADE NEPHROSTOGRAM 3. FLUOROSCOPIC GUIDED CONVERSION OF RIGHT-SIDED PERCUTANEOUS NEPHROSTOMY CATHETER TO A URETERAL STENT. 4. FLUOROSCOPIC GUIDED RIGHT-SIDED PERCUTANEOUS NEPHROSTOMY CATHETER EXCHANGE  COMPARISON:  CT abdomen pelvis- earlier same day ; 11/23/2014; CT-guided right sacral mass biopsy -11/23/2014  CONTRAST:  20 mL Omnipaque 300 administered into the collecting system  FLUOROSCOPY TIME:  4 minutes 48 seconds (24 mGy)  COMPLICATIONS: None immediate  TECHNIQUE: Informed written consent was obtained from the patient after a discussion of the risks, benefits and alternatives to treatment. Questions regarding the procedure were encouraged and answered. A timeout was performed prior to the initiation of the procedure.  The right flank and site of the prior recently removed right nephrostomy catheter ostomy was prepped and draped in the usual sterile fashion. A sterile  drape was applied covering the operative field. Maximum barrier sterile technique with sterile gowns and gloves were used for the procedure. A timeout was performed prior to the initiation of the procedure.  The exit site  of the recently removed right nephrostomy catheter ostomy was cannulated with a Christmas tree adapter. Contrast was injected demonstrated patency of the prior nephrostomy catheter track with opacification of the right renal collecting system.  As such, with the use of a regular glidewire, a Kumpe catheter was manipulated through the percutaneous nephrostomy catheter tract to the level of the right renal pelvis. Contrast injection confirmed appropriate positioning.  An antegrade nephrostogram was performed via the Kumpe catheter demonstrating patency of the right ureter to the level of the urinary bladder. As such, the decision was made to attempt placement of a right-sided double-J ureteral stent.  Under intermittent fluoroscopic guidance, the Kumpe catheter was exchanged for a 8-French vascular sheath which was advanced into the renal pelvis and utilized to advance an additional Benson wire into the renal collecting system to be used as a Chiropodist.  The Kumpe catheter was then utilized to estimate to appropriate length of the ureteral stent. An 8-French, 22 cm ureteral stent was then advanced over an Amplatz superstiff guidewire. The distal portion was formed at the level of the bladder. Proximal portion was then formed at the level of the renal collecting system.  At this point, the safety wire was utilized to replace an existing 10.2 [place] percutaneous nephrostomy catheter within the renal collecting system. Postprocedural images were obtained in various obliquities.  The nephrostomy catheter was capped and a dressing was placed. The patient tolerated the procedure well without immediate postprocedural complication.  FINDINGS: Successful fluoroscopic guided re- cannulization of prior right-sided percutaneous nephrostomy catheter tract.  Right-sided antegrade nephrostogram demonstrate patency of the right ureter to the level of the urinary bladder. No residual right-sided ureterectasis or pelvicaliectasis.   Successful fluoroscopic guided placement of a right-sided double-J ureteral stent with superior coil within the renal pelvis and inferior coil within the urinary bladder.  After successful fluoroscopic guided percutaneous nephrostomy exchange, with the new nephrostomy catheter coiled and locked within the right renal pelvis.  IMPRESSION: 1. Successful fluoroscopic guided recannulization of right-sided percutaneous nephrostomy catheter track. 2. Successful fluoroscopic guided placement of a 8 French, 22 cm ureteral stent with superior coil within the right renal pelvis and inferior coil within the urinary bladder. 3. Successful fluoroscopic guided exchange of right-sided 10.2 French percutaneous nephrostomy catheter.  PLAN: The patient's right-sided percutaneous nephrostomy catheter was capped for a trial of internalization.  The patient was given a gravity bag and instructed to reconnect the nephrostomy catheter to the gravity bag if she were develop recurrent right-sided flank pain and obstructive symptoms. The patient and the patient's family demonstrated good understanding of these instructions.  The patient will return to the interventional radiology department on Monday, 8/8, for repeat right-sided antegrade nephrostogram and potential fluoroscopic guided nephrostomy catheter removal.   Electronically Signed   By: Sandi Mariscal M.D.   On: 01/03/2015 15:34    ASSESSMENT AND PLAN: This is a very pleasant 81 years old white female with history of stage IV non-small cell lung cancer diagnosed in March of 2007 status post 6 cycles of systemic chemotherapy with carboplatin and docetaxel and has been observation since that time with no evidence for disease progression for almost 9 years.  She was found recently to have evidence for disease recurrence with large lumbar paraspinous mass with  osseous invasion and the core biopsy was consistent with metastatic squamous cell carcinoma.  She is status post palliative  radiotherapy to the soft tissue mass in the lumbar area. The patient is feeling much better today and she is ready to consider systemic treatment. I discussed with the patient again her treatment options including palliative care and hospice referral versus consideration of systemic chemotherapy with carboplatin for AUC of 5 and paclitaxel 175 MG/M2 every 3 weeks with Neulasta support. I discussed with the patient adverse effect of this treatment including but not limited to alopecia, myelosuppression, nausea and vomiting, peripheral neuropathy, liver or renal dysfunction. The patient would like to proceed with the treatment as planned and she is expected to start the first dose of this treatment next week. If she has intolerance to systemic chemotherapy or evidence for disease progression, the patient would be considered for treatment with immunotherapy. For the lumbar decubitus ulcer, I referred the patient to the [place]. for evaluation. She is expected to see them tomorrow. For the urinary tract prophylaxis, the patient will continue on Macrobid as ordered by her neurologist. For depression and lack of appetite, I started the patient on Remeron 30 mg by mouth daily at bedtime. She discontinued this medication secondary to intolerance. The patient would come back for follow-up visit in 2 weeks for evaluation and management of any adverse effect of her chemotherapy. I will call her pharmacy with prescription for Compazine 10 mg by mouth every 6 hours as needed for nausea. The patient was advised to call immediately if she has any concerning symptoms in the interval.  All questions were answered. The patient knows to call the clinic with any problems, questions or concerns. We can certainly see the patient much sooner if necessary.  Disclaimer: This note was dictated with voice recognition software. Similar sounding words can inadvertently be transcribed and may not be corrected upon review.

## 2015-01-26 ENCOUNTER — Telehealth: Payer: Self-pay | Admitting: *Deleted

## 2015-01-26 ENCOUNTER — Encounter (HOSPITAL_BASED_OUTPATIENT_CLINIC_OR_DEPARTMENT_OTHER): Payer: Medicare Other | Attending: Internal Medicine

## 2015-01-26 DIAGNOSIS — I2510 Atherosclerotic heart disease of native coronary artery without angina pectoris: Secondary | ICD-10-CM | POA: Diagnosis not present

## 2015-01-26 DIAGNOSIS — L89154 Pressure ulcer of sacral region, stage 4: Secondary | ICD-10-CM | POA: Diagnosis present

## 2015-01-26 DIAGNOSIS — M109 Gout, unspecified: Secondary | ICD-10-CM | POA: Insufficient documentation

## 2015-01-26 DIAGNOSIS — L89512 Pressure ulcer of right ankle, stage 2: Secondary | ICD-10-CM | POA: Diagnosis not present

## 2015-01-26 DIAGNOSIS — I10 Essential (primary) hypertension: Secondary | ICD-10-CM | POA: Insufficient documentation

## 2015-01-26 DIAGNOSIS — N186 End stage renal disease: Secondary | ICD-10-CM | POA: Diagnosis not present

## 2015-01-26 DIAGNOSIS — G629 Polyneuropathy, unspecified: Secondary | ICD-10-CM | POA: Diagnosis not present

## 2015-01-26 NOTE — Telephone Encounter (Signed)
Per staff message and POF I have scheduled appts. Advised scheduler of appts. JMW  

## 2015-01-27 ENCOUNTER — Telehealth: Payer: Self-pay | Admitting: Internal Medicine

## 2015-01-27 NOTE — Telephone Encounter (Signed)
returned call and s.w pt and confirmed appts....pt ok and aware °

## 2015-01-31 ENCOUNTER — Encounter: Payer: Self-pay | Admitting: Pharmacist

## 2015-02-03 ENCOUNTER — Ambulatory Visit: Payer: Medicare Other

## 2015-02-03 ENCOUNTER — Other Ambulatory Visit: Payer: Self-pay | Admitting: Medical Oncology

## 2015-02-03 ENCOUNTER — Ambulatory Visit (HOSPITAL_BASED_OUTPATIENT_CLINIC_OR_DEPARTMENT_OTHER): Payer: Medicare Other

## 2015-02-03 ENCOUNTER — Other Ambulatory Visit (HOSPITAL_BASED_OUTPATIENT_CLINIC_OR_DEPARTMENT_OTHER): Payer: Medicare Other

## 2015-02-03 ENCOUNTER — Telehealth: Payer: Self-pay | Admitting: *Deleted

## 2015-02-03 VITALS — BP 137/72 | HR 80 | Temp 97.5°F | Resp 18

## 2015-02-03 DIAGNOSIS — Z5111 Encounter for antineoplastic chemotherapy: Secondary | ICD-10-CM

## 2015-02-03 DIAGNOSIS — C3491 Malignant neoplasm of unspecified part of right bronchus or lung: Secondary | ICD-10-CM

## 2015-02-03 DIAGNOSIS — C7951 Secondary malignant neoplasm of bone: Secondary | ICD-10-CM | POA: Diagnosis not present

## 2015-02-03 DIAGNOSIS — Z85118 Personal history of other malignant neoplasm of bronchus and lung: Secondary | ICD-10-CM

## 2015-02-03 DIAGNOSIS — Z5189 Encounter for other specified aftercare: Secondary | ICD-10-CM | POA: Diagnosis not present

## 2015-02-03 DIAGNOSIS — C3492 Malignant neoplasm of unspecified part of left bronchus or lung: Secondary | ICD-10-CM

## 2015-02-03 LAB — CBC WITH DIFFERENTIAL/PLATELET
BASO%: 0.2 % (ref 0.0–2.0)
Basophils Absolute: 0 10*3/uL (ref 0.0–0.1)
EOS ABS: 0.2 10*3/uL (ref 0.0–0.5)
EOS%: 2.4 % (ref 0.0–7.0)
HCT: 28.8 % — ABNORMAL LOW (ref 34.8–46.6)
HGB: 8.7 g/dL — ABNORMAL LOW (ref 11.6–15.9)
LYMPH%: 7.9 % — AB (ref 14.0–49.7)
MCH: 25.1 pg (ref 25.1–34.0)
MCHC: 30.2 g/dL — AB (ref 31.5–36.0)
MCV: 83.2 fL (ref 79.5–101.0)
MONO#: 0.6 10*3/uL (ref 0.1–0.9)
MONO%: 10.2 % (ref 0.0–14.0)
NEUT#: 4.9 10*3/uL (ref 1.5–6.5)
NEUT%: 79.3 % — AB (ref 38.4–76.8)
PLATELETS: 278 10*3/uL (ref 145–400)
RBC: 3.46 10*6/uL — AB (ref 3.70–5.45)
RDW: 18.1 % — ABNORMAL HIGH (ref 11.2–14.5)
WBC: 6.2 10*3/uL (ref 3.9–10.3)
lymph#: 0.5 10*3/uL — ABNORMAL LOW (ref 0.9–3.3)

## 2015-02-03 LAB — COMPREHENSIVE METABOLIC PANEL (CC13)
ALBUMIN: 2.1 g/dL — AB (ref 3.5–5.0)
ALK PHOS: 108 U/L (ref 40–150)
AST: 10 U/L (ref 5–34)
Anion Gap: 11 mEq/L (ref 3–11)
BUN: 34 mg/dL — AB (ref 7.0–26.0)
CALCIUM: 8.2 mg/dL — AB (ref 8.4–10.4)
CO2: 22 mEq/L (ref 22–29)
CREATININE: 2.4 mg/dL — AB (ref 0.6–1.1)
Chloride: 106 mEq/L (ref 98–109)
EGFR: 19 mL/min/{1.73_m2} — ABNORMAL LOW (ref >90)
GLUCOSE: 92 mg/dL (ref 70–140)
POTASSIUM: 3.4 meq/L — AB (ref 3.5–5.1)
SODIUM: 139 meq/L (ref 136–145)
TOTAL PROTEIN: 6.1 g/dL — AB (ref 6.4–8.3)
Total Bilirubin: 0.31 mg/dL (ref 0.20–1.20)

## 2015-02-03 MED ORDER — PACLITAXEL CHEMO INJECTION 300 MG/50ML
175.0000 mg/m2 | Freq: Once | INTRAVENOUS | Status: AC
  Administered 2015-02-03: 324 mg via INTRAVENOUS
  Filled 2015-02-03: qty 54

## 2015-02-03 MED ORDER — HEPARIN SOD (PORK) LOCK FLUSH 100 UNIT/ML IV SOLN
500.0000 [IU] | Freq: Once | INTRAVENOUS | Status: DC | PRN
  Filled 2015-02-03: qty 5

## 2015-02-03 MED ORDER — PEGFILGRASTIM 6 MG/0.6ML ~~LOC~~ PSKT
6.0000 mg | PREFILLED_SYRINGE | Freq: Once | SUBCUTANEOUS | Status: AC
  Administered 2015-02-03: 6 mg via SUBCUTANEOUS
  Filled 2015-02-03: qty 0.6

## 2015-02-03 MED ORDER — CARBOPLATIN CHEMO INTRADERMAL TEST DOSE 100MCG/0.02ML
100.0000 ug | Freq: Once | INTRADERMAL | Status: AC
  Administered 2015-02-03: 100 ug via INTRADERMAL
  Filled 2015-02-03: qty 0.01

## 2015-02-03 MED ORDER — SODIUM CHLORIDE 0.9 % IV SOLN
Freq: Once | INTRAVENOUS | Status: AC
  Administered 2015-02-03: 12:00:00 via INTRAVENOUS
  Filled 2015-02-03: qty 8

## 2015-02-03 MED ORDER — FAMOTIDINE IN NACL 20-0.9 MG/50ML-% IV SOLN
INTRAVENOUS | Status: AC
  Filled 2015-02-03: qty 50

## 2015-02-03 MED ORDER — DIPHENHYDRAMINE HCL 50 MG/ML IJ SOLN
50.0000 mg | Freq: Once | INTRAMUSCULAR | Status: AC
  Administered 2015-02-03: 50 mg via INTRAVENOUS

## 2015-02-03 MED ORDER — FAMOTIDINE IN NACL 20-0.9 MG/50ML-% IV SOLN
20.0000 mg | Freq: Once | INTRAVENOUS | Status: AC
  Administered 2015-02-03: 20 mg via INTRAVENOUS

## 2015-02-03 MED ORDER — DIPHENHYDRAMINE HCL 50 MG/ML IJ SOLN
INTRAMUSCULAR | Status: AC
  Filled 2015-02-03: qty 1

## 2015-02-03 MED ORDER — SODIUM CHLORIDE 0.9 % IV SOLN
Freq: Once | INTRAVENOUS | Status: AC
  Administered 2015-02-03: 10:00:00 via INTRAVENOUS

## 2015-02-03 MED ORDER — SODIUM CHLORIDE 0.9 % IJ SOLN
10.0000 mL | INTRAMUSCULAR | Status: DC | PRN
  Filled 2015-02-03: qty 10

## 2015-02-03 MED ORDER — SODIUM CHLORIDE 0.9 % IV SOLN
206.0000 mg | Freq: Once | INTRAVENOUS | Status: AC
  Administered 2015-02-03: 210 mg via INTRAVENOUS
  Filled 2015-02-03: qty 21

## 2015-02-03 NOTE — Progress Notes (Signed)
OK to treat with creatinine of 2.4 per Dr. Julien Nordmann.

## 2015-02-03 NOTE — Patient Instructions (Addendum)
Paclitaxel injection What is this medicine? PACLITAXEL (PAK li TAX el) is a chemotherapy drug. It targets fast dividing cells, like cancer cells, and causes these cells to die. This medicine is used to treat ovarian cancer, breast cancer, and other cancers. This medicine may be used for other purposes; ask your health care provider or pharmacist if you have questions. COMMON BRAND NAME(S): Onxol, Taxol What should I tell my health care provider before I take this medicine? They need to know if you have any of these conditions: -blood disorders -irregular heartbeat -infection (especially a virus infection such as chickenpox, cold sores, or herpes) -liver disease -previous or ongoing radiation therapy -an unusual or allergic reaction to paclitaxel, alcohol, polyoxyethylated castor oil, other chemotherapy agents, other medicines, foods, dyes, or preservatives -pregnant or trying to get pregnant -breast-feeding How should I use this medicine? This drug is given as an infusion into a vein. It is administered in a hospital or clinic by a specially trained health care professional. Talk to your pediatrician regarding the use of this medicine in children. Special care may be needed. Overdosage: If you think you have taken too much of this medicine contact a poison control center or emergency room at once. NOTE: This medicine is only for you. Do not share this medicine with others. What if I miss a dose? It is important not to miss your dose. Call your doctor or health care professional if you are unable to keep an appointment. What may interact with this medicine? Do not take this medicine with any of the following medications: -disulfiram -metronidazole This medicine may also interact with the following medications: -cyclosporine -diazepam -ketoconazole -medicines to increase blood counts like filgrastim, pegfilgrastim, sargramostim -other chemotherapy drugs like cisplatin, doxorubicin,  epirubicin, etoposide, teniposide, vincristine -quinidine -testosterone -vaccines -verapamil Talk to your doctor or health care professional before taking any of these medicines: -acetaminophen -aspirin -ibuprofen -ketoprofen -naproxen This list may not describe all possible interactions. Give your health care provider a list of all the medicines, herbs, non-prescription drugs, or dietary supplements you use. Also tell them if you smoke, drink alcohol, or use illegal drugs. Some items may interact with your medicine. What should I watch for while using this medicine? Your condition will be monitored carefully while you are receiving this medicine. You will need important blood work done while you are taking this medicine. This drug may make you feel generally unwell. This is not uncommon, as chemotherapy can affect healthy cells as well as cancer cells. Report any side effects. Continue your course of treatment even though you feel ill unless your doctor tells you to stop. In some cases, you may be given additional medicines to help with side effects. Follow all directions for their use. Call your doctor or health care professional for advice if you get a fever, chills or sore throat, or other symptoms of a cold or flu. Do not treat yourself. This drug decreases your body's ability to fight infections. Try to avoid being around people who are sick. This medicine may increase your risk to bruise or bleed. Call your doctor or health care professional if you notice any unusual bleeding. Be careful brushing and flossing your teeth or using a toothpick because you may get an infection or bleed more easily. If you have any dental work done, tell your dentist you are receiving this medicine. Avoid taking products that contain aspirin, acetaminophen, ibuprofen, naproxen, or ketoprofen unless instructed by your doctor. These medicines may hide a fever.   Do not become pregnant while taking this medicine.  Women should inform their doctor if they wish to become pregnant or think they might be pregnant. There is a potential for serious side effects to an unborn child. Talk to your health care professional or pharmacist for more information. Do not breast-feed an infant while taking this medicine. Men are advised not to father a child while receiving this medicine. What side effects may I notice from receiving this medicine? Side effects that you should report to your doctor or health care professional as soon as possible: -allergic reactions like skin rash, itching or hives, swelling of the face, lips, or tongue -low blood counts - This drug may decrease the number of white blood cells, red blood cells and platelets. You may be at increased risk for infections and bleeding. -signs of infection - fever or chills, cough, sore throat, pain or difficulty passing urine -signs of decreased platelets or bleeding - bruising, pinpoint red spots on the skin, black, tarry stools, nosebleeds -signs of decreased red blood cells - unusually weak or tired, fainting spells, lightheadedness -breathing problems -chest pain -high or low blood pressure -mouth sores -nausea and vomiting -pain, swelling, redness or irritation at the injection site -pain, tingling, numbness in the hands or feet -slow or irregular heartbeat -swelling of the ankle, feet, hands Side effects that usually do not require medical attention (report to your doctor or health care professional if they continue or are bothersome): -bone pain -complete hair loss including hair on your head, underarms, pubic hair, eyebrows, and eyelashes -changes in the color of fingernails -diarrhea -loosening of the fingernails -loss of appetite -muscle or joint pain -red flush to skin -sweating This list may not describe all possible side effects. Call your doctor for medical advice about side effects. You may report side effects to FDA at  1-800-FDA-1088. Where should I keep my medicine? This drug is given in a hospital or clinic and will not be stored at home. NOTE: This sheet is a summary. It may not cover all possible information. If you have questions about this medicine, talk to your doctor, pharmacist, or health care provider.  2015, [employer]vier/[employer] Standard. (2012-07-13 16:41:21)  Carboplatin injection What is this medicine? CARBOPLATIN (KAR boe pla tin) is a chemotherapy drug. It targets fast dividing cells, like cancer cells, and causes these cells to die. This medicine is used to treat ovarian cancer and many other cancers. This medicine may be used for other purposes; ask your health care provider or pharmacist if you have questions. COMMON BRAND NAME(S): Paraplatin What should I tell my health care provider before I take this medicine? They need to know if you have any of these conditions: -blood disorders -hearing problems -kidney disease -recent or ongoing radiation therapy -an unusual or allergic reaction to carboplatin, cisplatin, other chemotherapy, other medicines, foods, dyes, or preservatives -pregnant or trying to get pregnant -breast-feeding How should I use this medicine? This drug is usually given as an infusion into a vein. It is administered in a hospital or clinic by a specially trained health care professional. Talk to your pediatrician regarding the use of this medicine in children. Special care may be needed. Overdosage: If you think you have taken too much of this medicine contact a poison control center or emergency room at once. NOTE: This medicine is only for you. Do not share this medicine with others. What if I miss a dose? It is important not to miss a dose. Call your   doctor or health care professional if you are unable to keep an appointment. What may interact with this medicine? -medicines for seizures -medicines to increase blood counts like filgrastim, pegfilgrastim,  sargramostim -some antibiotics like amikacin, gentamicin, neomycin, streptomycin, tobramycin -vaccines Talk to your doctor or health care professional before taking any of these medicines: -acetaminophen -aspirin -ibuprofen -ketoprofen -naproxen This list may not describe all possible interactions. Give your health care provider a list of all the medicines, herbs, non-prescription drugs, or dietary supplements you use. Also tell them if you smoke, drink alcohol, or use illegal drugs. Some items may interact with your medicine. What should I watch for while using this medicine? Your condition will be monitored carefully while you are receiving this medicine. You will need important blood work done while you are taking this medicine. This drug may make you feel generally unwell. This is not uncommon, as chemotherapy can affect healthy cells as well as cancer cells. Report any side effects. Continue your course of treatment even though you feel ill unless your doctor tells you to stop. In some cases, you may be given additional medicines to help with side effects. Follow all directions for their use. Call your doctor or health care professional for advice if you get a fever, chills or sore throat, or other symptoms of a cold or flu. Do not treat yourself. This drug decreases your body's ability to fight infections. Try to avoid being around people who are sick. This medicine may increase your risk to bruise or bleed. Call your doctor or health care professional if you notice any unusual bleeding. Be careful brushing and flossing your teeth or using a toothpick because you may get an infection or bleed more easily. If you have any dental work done, tell your dentist you are receiving this medicine. Avoid taking products that contain aspirin, acetaminophen, ibuprofen, naproxen, or ketoprofen unless instructed by your doctor. These medicines may hide a fever. Do not become pregnant while taking this  medicine. Women should inform their doctor if they wish to become pregnant or think they might be pregnant. There is a potential for serious side effects to an unborn child. Talk to your health care professional or pharmacist for more information. Do not breast-feed an infant while taking this medicine. What side effects may I notice from receiving this medicine? Side effects that you should report to your doctor or health care professional as soon as possible: -allergic reactions like skin rash, itching or hives, swelling of the face, lips, or tongue -signs of infection - fever or chills, cough, sore throat, pain or difficulty passing urine -signs of decreased platelets or bleeding - bruising, pinpoint red spots on the skin, black, tarry stools, nosebleeds -signs of decreased red blood cells - unusually weak or tired, fainting spells, lightheadedness -breathing problems -changes in hearing -changes in vision -chest pain -high blood pressure -low blood counts - This drug may decrease the number of white blood cells, red blood cells and platelets. You may be at increased risk for infections and bleeding. -nausea and vomiting -pain, swelling, redness or irritation at the injection site -pain, tingling, numbness in the hands or feet -problems with balance, talking, walking -trouble passing urine or change in the amount of urine Side effects that usually do not require medical attention (report to your doctor or health care professional if they continue or are bothersome): -hair loss -loss of appetite -metallic taste in the mouth or changes in taste This list may not describe all   possible side effects. Call your doctor for medical advice about side effects. You may report side effects to FDA at 1-800-FDA-1088. Where should I keep my medicine? This drug is given in a hospital or clinic and will not be stored at home. NOTE: This sheet is a summary. It may not cover all possible information. If you  have questions about this medicine, talk to your doctor, pharmacist, or health care provider.  2015, [employer]vier/Gold Standard. (2007-08-25 14:38:05)  Pegfilgrastim injection What is this medicine? PEGFILGRASTIM (peg fil GRA stim) is a long-acting granulocyte colony-stimulating factor that stimulates the growth of neutrophils, a type of white blood cell important in the body's fight against infection. It is used to reduce the incidence of fever and infection in patients with certain types of cancer who are receiving chemotherapy that affects the bone marrow. This medicine may be used for other purposes; ask your health care provider or pharmacist if you have questions. COMMON BRAND NAME(S): Neulasta What should I tell my health care provider before I take this medicine? They need to know if you have any of these conditions: -latex allergy -ongoing radiation therapy -sickle cell disease -skin reactions to acrylic adhesives (On-Body Injector only) -an unusual or allergic reaction to pegfilgrastim, filgrastim, other medicines, foods, dyes, or preservatives -pregnant or trying to get pregnant -breast-feeding How should I use this medicine? This medicine is for injection under the skin. If you get this medicine at home, you will be taught how to prepare and give the pre-filled syringe or how to use the On-body Injector. Refer to the patient Instructions for Use for detailed instructions. Use exactly as directed. Take your medicine at regular intervals. Do not take your medicine more often than directed. It is important that you put your used needles and syringes in a special sharps container. Do not put them in a trash can. If you do not have a sharps container, call your pharmacist or healthcare provider to get one. Talk to your pediatrician regarding the use of this medicine in children. Special care may be needed. Overdosage: If you think you have taken too much of this medicine contact a poison  control center or emergency room at once. NOTE: This medicine is only for you. Do not share this medicine with others. What if I miss a dose? It is important not to miss your dose. Call your doctor or health care professional if you miss your dose. If you miss a dose due to an On-body Injector failure or leakage, a new dose should be administered as soon as possible using a single prefilled syringe for manual use. What may interact with this medicine? Interactions have not been studied. Give your health care provider a list of all the medicines, herbs, non-prescription drugs, or dietary supplements you use. Also tell them if you smoke, drink alcohol, or use illegal drugs. Some items may interact with your medicine. This list may not describe all possible interactions. Give your health care provider a list of all the medicines, herbs, non-prescription drugs, or dietary supplements you use. Also tell them if you smoke, drink alcohol, or use illegal drugs. Some items may interact with your medicine. What should I watch for while using this medicine? You may need blood work done while you are taking this medicine. If you are going to need a MRI, CT scan, or other procedure, tell your doctor that you are using this medicine (On-Body Injector only). What side effects may I notice from receiving this medicine? Side  effects that you should report to your doctor or health care professional as soon as possible: -allergic reactions like skin rash, itching or hives, swelling of the face, lips, or tongue -dizziness -fever -pain, redness, or irritation at site where injected -pinpoint red spots on the skin -shortness of breath or breathing problems -stomach or side pain, or pain at the shoulder -swelling -tiredness -trouble passing urine Side effects that usually do not require medical attention (report to your doctor or health care professional if they continue or are bothersome): -bone pain -muscle  pain This list may not describe all possible side effects. Call your doctor for medical advice about side effects. You may report side effects to FDA at 1-800-FDA-1088. Where should I keep my medicine? Keep out of the reach of children. Store pre-filled syringes in a refrigerator between 2 and 8 degrees C (36 and 46 degrees F). Do not freeze. Keep in carton to protect from light. Throw away this medicine if it is left out of the refrigerator for more than 48 hours. Throw away any unused medicine after the expiration date. NOTE: This sheet is a summary. It may not cover all possible information. If you have questions about this medicine, talk to your doctor, pharmacist, or health care provider.  2015, [employer]/Gold Standard. (2013-08-19 16:14:05)    [facility] Discharge Instructions for Patients Receiving Chemotherapy  Today you received the following chemotherapy agents Taxol/Carboplatin.  To help prevent nausea and vomiting after your treatment, we encourage you to take your nausea medication as directed.   If you develop nausea and vomiting that is not controlled by your nausea medication, call the clinic.   BELOW ARE SYMPTOMS THAT SHOULD BE REPORTED IMMEDIATELY:  *FEVER GREATER THAN 100.5 F  *CHILLS WITH OR WITHOUT FEVER  NAUSEA AND VOMITING THAT IS NOT CONTROLLED WITH YOUR NAUSEA MEDICATION  *UNUSUAL SHORTNESS OF BREATH  *UNUSUAL BRUISING OR BLEEDING  TENDERNESS IN MOUTH AND THROAT WITH OR WITHOUT PRESENCE OF ULCERS  *URINARY PROBLEMS  *BOWEL PROBLEMS  UNUSUAL RASH Items with * indicate a potential emergency and should be followed up as soon as possible.  Feel free to call the clinic you have any questions or concerns. The clinic phone number is [phone].  Please show the [place] at check-in to the Emergency Department and triage nurse.

## 2015-02-03 NOTE — Telephone Encounter (Signed)
I have discussed with the family and moved her appts. Family given new schedule.

## 2015-02-04 ENCOUNTER — Ambulatory Visit: Payer: Medicare Other

## 2015-02-07 ENCOUNTER — Other Ambulatory Visit: Payer: Self-pay | Admitting: Cardiovascular Disease

## 2015-02-08 ENCOUNTER — Telehealth: Payer: Self-pay | Admitting: Internal Medicine

## 2015-02-08 NOTE — Telephone Encounter (Signed)
9pt dtr called to r/s appt...done....she is aware of new d.t

## 2015-02-08 NOTE — Telephone Encounter (Signed)
returned call and lvm for pt dtr per pt request to call back to r/s 9.8 appt....[phone]

## 2015-02-09 ENCOUNTER — Other Ambulatory Visit: Payer: Medicare Other

## 2015-02-09 ENCOUNTER — Ambulatory Visit: Payer: Medicare Other | Admitting: Physician Assistant

## 2015-02-10 ENCOUNTER — Other Ambulatory Visit: Payer: Self-pay | Admitting: Internal Medicine

## 2015-02-10 ENCOUNTER — Telehealth: Payer: Self-pay | Admitting: Physician Assistant

## 2015-02-10 ENCOUNTER — Ambulatory Visit (HOSPITAL_BASED_OUTPATIENT_CLINIC_OR_DEPARTMENT_OTHER): Payer: Medicare Other | Admitting: Physician Assistant

## 2015-02-10 ENCOUNTER — Ambulatory Visit: Payer: Medicare Other

## 2015-02-10 ENCOUNTER — Other Ambulatory Visit: Payer: Self-pay | Admitting: *Deleted

## 2015-02-10 ENCOUNTER — Other Ambulatory Visit: Payer: Self-pay | Admitting: Medical Oncology

## 2015-02-10 ENCOUNTER — Telehealth: Payer: Self-pay | Admitting: *Deleted

## 2015-02-10 ENCOUNTER — Other Ambulatory Visit (HOSPITAL_BASED_OUTPATIENT_CLINIC_OR_DEPARTMENT_OTHER): Payer: Medicare Other

## 2015-02-10 ENCOUNTER — Ambulatory Visit (HOSPITAL_BASED_OUTPATIENT_CLINIC_OR_DEPARTMENT_OTHER): Payer: Medicare Other

## 2015-02-10 ENCOUNTER — Encounter: Payer: Self-pay | Admitting: Physician Assistant

## 2015-02-10 ENCOUNTER — Ambulatory Visit (HOSPITAL_COMMUNITY)
Admission: RE | Admit: 2015-02-10 | Discharge: 2015-02-10 | Disposition: A | Discharge status: Home / Self Care | Payer: Medicare Other | Source: Ambulatory Visit | Attending: Internal Medicine | Admitting: Internal Medicine

## 2015-02-10 VITALS — BP 118/40 | HR 74 | Temp 98.5°F | Resp 18

## 2015-02-10 VITALS — BP 91/59 | HR 90 | Temp 98.3°F | Resp 18 | Ht 68.5 in

## 2015-02-10 DIAGNOSIS — I2510 Atherosclerotic heart disease of native coronary artery without angina pectoris: Secondary | ICD-10-CM

## 2015-02-10 DIAGNOSIS — E876 Hypokalemia: Secondary | ICD-10-CM

## 2015-02-10 DIAGNOSIS — N189 Chronic kidney disease, unspecified: Secondary | ICD-10-CM | POA: Diagnosis not present

## 2015-02-10 DIAGNOSIS — C7951 Secondary malignant neoplasm of bone: Secondary | ICD-10-CM

## 2015-02-10 DIAGNOSIS — T451X5A Adverse effect of antineoplastic and immunosuppressive drugs, initial encounter: Secondary | ICD-10-CM

## 2015-02-10 DIAGNOSIS — D701 Agranulocytosis secondary to cancer chemotherapy: Secondary | ICD-10-CM

## 2015-02-10 DIAGNOSIS — D649 Anemia, unspecified: Secondary | ICD-10-CM | POA: Insufficient documentation

## 2015-02-10 DIAGNOSIS — D6481 Anemia due to antineoplastic chemotherapy: Secondary | ICD-10-CM | POA: Diagnosis not present

## 2015-02-10 DIAGNOSIS — C3492 Malignant neoplasm of unspecified part of left bronchus or lung: Secondary | ICD-10-CM

## 2015-02-10 DIAGNOSIS — C3490 Malignant neoplasm of unspecified part of unspecified bronchus or lung: Secondary | ICD-10-CM

## 2015-02-10 DIAGNOSIS — Z5189 Encounter for other specified aftercare: Secondary | ICD-10-CM

## 2015-02-10 LAB — CBC WITH DIFFERENTIAL/PLATELET
BASO%: 0.9 % (ref 0.0–2.0)
Basophils Absolute: 0 10*3/uL (ref 0.0–0.1)
EOS%: 12.3 % — AB (ref 0.0–7.0)
Eosinophils Absolute: 0.1 10*3/uL (ref 0.0–0.5)
HEMATOCRIT: 23.2 % — AB (ref 34.8–46.6)
HEMOGLOBIN: 7.3 g/dL — AB (ref 11.6–15.9)
LYMPH#: 0.2 10*3/uL — AB (ref 0.9–3.3)
LYMPH%: 29.4 % (ref 14.0–49.7)
MCH: 25.3 pg (ref 25.1–34.0)
MCHC: 31.4 g/dL — ABNORMAL LOW (ref 31.5–36.0)
MCV: 80.6 fL (ref 79.5–101.0)
MONO#: 0.2 10*3/uL (ref 0.1–0.9)
MONO%: 22.6 % — ABNORMAL HIGH (ref 0.0–14.0)
NEUT#: 0.2 10*3/uL — CL (ref 1.5–6.5)
NEUT%: 34.8 % — AB (ref 38.4–76.8)
PLATELETS: 91 10*3/uL — AB (ref 145–400)
RBC: 2.88 10*6/uL — ABNORMAL LOW (ref 3.70–5.45)
RDW: 18.4 % — AB (ref 11.2–14.5)
WBC: 0.7 10*3/uL — CL (ref 3.9–10.3)

## 2015-02-10 LAB — COMPREHENSIVE METABOLIC PANEL (CC13)
ALBUMIN: 2 g/dL — AB (ref 3.5–5.0)
ALT: 7 U/L (ref 0–55)
ANION GAP: 12 meq/L — AB (ref 3–11)
AST: 7 U/L (ref 5–34)
Alkaline Phosphatase: 105 U/L (ref 40–150)
BILIRUBIN TOTAL: 0.65 mg/dL (ref 0.20–1.20)
BUN: 44.7 mg/dL — AB (ref 7.0–26.0)
CALCIUM: 7.6 mg/dL — AB (ref 8.4–10.4)
CO2: 21 mEq/L — ABNORMAL LOW (ref 22–29)
CREATININE: 2.9 mg/dL — AB (ref 0.6–1.1)
Chloride: 100 mEq/L (ref 98–109)
EGFR: 15 mL/min/{1.73_m2} — ABNORMAL LOW (ref >90)
Glucose: 127 mg/dl (ref 70–140)
Potassium: 3.1 mEq/L — ABNORMAL LOW (ref 3.5–5.1)
Sodium: 133 mEq/L — ABNORMAL LOW (ref 136–145)
TOTAL PROTEIN: 5.6 g/dL — AB (ref 6.4–8.3)

## 2015-02-10 LAB — PREPARE RBC (CROSSMATCH)

## 2015-02-10 LAB — ABO/RH: ABO/RH(D): O POS

## 2015-02-10 MED ORDER — SODIUM CHLORIDE 0.9 % IV SOLN
250.0000 mL | Freq: Once | INTRAVENOUS | Status: AC
  Administered 2015-02-10: 250 mL via INTRAVENOUS

## 2015-02-10 MED ORDER — DIPHENHYDRAMINE HCL 25 MG PO TABS
12.5000 mg | ORAL_TABLET | Freq: Once | ORAL | Status: DC
  Filled 2015-02-10: qty 0.5

## 2015-02-10 MED ORDER — TBO-FILGRASTIM 300 MCG/0.5ML ~~LOC~~ SOSY
300.0000 ug | PREFILLED_SYRINGE | Freq: Once | SUBCUTANEOUS | Status: AC
  Administered 2015-02-10: 300 ug via SUBCUTANEOUS
  Filled 2015-02-10: qty 0.5

## 2015-02-10 MED ORDER — ACETAMINOPHEN 325 MG PO TABS
650.0000 mg | ORAL_TABLET | Freq: Once | ORAL | Status: AC
  Administered 2015-02-10: 650 mg via ORAL

## 2015-02-10 MED ORDER — ACETAMINOPHEN 325 MG PO TABS
ORAL_TABLET | ORAL | Status: AC
  Filled 2015-02-10: qty 2

## 2015-02-10 MED ORDER — POTASSIUM CHLORIDE CRYS ER 20 MEQ PO TBCR: 20.0000 meq | EXTENDED_RELEASE_TABLET | Freq: Every day | ORAL | Status: DC

## 2015-02-10 NOTE — Telephone Encounter (Signed)
I have adjusted 9/22

## 2015-02-10 NOTE — Progress Notes (Signed)
[place] Telephone:([phone]) [phone]   Fax:([phone]) [place], [place]  [place] [address]  PRINCIPAL DIAGNOSIS: Stage IV non-small cell lung cancer diagnosed in March 2007, with disease recurrence in June 2016 with positive PDL 1 expression.   PRIOR THERAPY:  1) Status post 6 cycles of systemic chemotherapy with carboplatin and docetaxel. Last dose was given January 16, 2006.  2) Palliative radiotherapy to the large right paraspinous mass with osseous invasion centered at L5 under the care of Dr. Valere Dross.  CURRENT THERAPY: Systemic chemotherapy with carboplatin for AUC of 5 and paclitaxel 175 MG/M2 every 3 weeks with Neulasta support. First dose expected on 02/02/2015.  DISEASE STAGE: Stage IV non-small cell lung cancer diagnosed in March of 2007.  CHEMOTHERAPY INTENT: Palliative  CURRENT # OF CHEMOTHERAPY CYCLES: 1  CURRENT ANTIEMETICS: None  CURRENT SMOKING STATUS: Nonsmoker  ORAL CHEMOTHERAPY AND CONSENT: None  CURRENT BISPHOSPHONATES USE: None  LIVING WILL AND CODE STATUS: Full code  INTERVAL HISTORY: [name] 81 y.o. female returns to the clinic today for followup visit accompanied by her grandson. She reports she was diagnosed with a DVT of the left lower extremity and is on Lovenox. She has had difficulty with getting her Lovenox refilled. She tolerated her first cycle of chemotherapy relatively well with the exception of excessive jerking with the Benadryl. The patient continues to complain of increasing fatigue and weakness as well as weight loss.  She denied having any significant pain in the back or lower extremities. She denied having any significant chest pain, shortness of breath, cough or hemoptysis. She has no nausea or vomiting. She denied having any significant fever or chills. She reports some looser than normal bowel movements but no frank diarrhea. Her blood pressure is been running low and  she decreased her Catapres from 3 times a day to once daily as a result.  MEDICAL HISTORY: Past Medical History  Diagnosis Date  . Hypertension   . Hypercholesterolemia   . CKD (chronic kidney disease)   . Coronary artery disease     a.  LHC (06/04/05): LHC done in [place] with high grade RCA => s/p BMS to RCA;  b.  Nuclear (09/14/09): Lexiscan; Inf infarct with mild peri-infarct ishemia, EF 52%; Low Risk.  Marland Kitchen GERD (gastroesophageal reflux disease)   . Chronic anemia   . Ischemic cardiomyopathy     a. Echo (07/26/13): Mild LVH, EF 35-40%, diff HK, inf AK, Gr 2 DD, Tr AI, mildly dilated Ao root, MAC, mild MR, mild LAE, mod reduced RVSF.  . Non-small cell carcinoma of lung     Stage IV    ALLERGIES:  is allergic to amlodipine; ciprofloxacin; mirtazapine; statins; aspirin; cephalexin; hydralazine; iron; sulfonamide derivatives; penicillins; and tape.  MEDICATIONS:  Current Outpatient Prescriptions  Medication Sig Dispense Refill  . cloNIDine (CATAPRES) 0.2 MG tablet Take 1 tablet (0.2 mg total) by mouth 3 (three) times daily.    Marland Kitchen enoxaparin (LOVENOX) 80 MG/0.8ML injection Inject 0.8 mLs (80 mg total) into the skin daily.    . furosemide (LASIX) 40 MG tablet Take 1 tablet (40 mg total) by mouth daily. 90 tablet 3  . HYDROcodone-acetaminophen (NORCO/VICODIN) 5-325 MG per tablet Take 1 tablet by mouth every 6 (six) hours as needed for moderate pain. Max APAP 3gm/24 hrs from all sources 120 tablet 0  . loperamide (IMODIUM A-D) 2 MG tablet Take 2 mg by mouth 4 (  four) times daily as needed for diarrhea or loose stools (diarrhea).    . LORazepam (ATIVAN) 1 MG tablet Take one tablet by mouth every 8 hours for anxiety (Patient taking differently: Take 1 mg by mouth every 8 (eight) hours as needed for anxiety. Take one tablet by mouth every 8 hours for anxiety) 90 tablet 5  . metoprolol succinate (TOPROL-XL) 25 MG 24 hr tablet Take 1 tablet (25 mg total) by mouth daily.    . pantoprazole  (PROTONIX) 40 MG tablet TAKE ONE TABLET BY MOUTH ONCE DAILY. 90 tablet 0  . polyethylene glycol (MIRALAX / GLYCOLAX) packet Take 17 g by mouth daily. (Patient taking differently: Take 17 g by mouth daily as needed for moderate constipation. )    . prochlorperazine (COMPAZINE) 10 MG tablet Take 1 tablet (10 mg total) by mouth every 6 (six) hours as needed for nausea or vomiting. 30 tablet 0  . SANTYL ointment Apply 1 application topically at bedtime as needed. For bed sores    . venlafaxine (EFFEXOR) 37.5 MG tablet Take 1 tablet (37.5 mg total) by mouth 2 (two) times daily. 60 tablet 0   No current facility-administered medications for this visit.   Facility-Administered Medications Ordered in Other Visits  Medication Dose Route Frequency Provider Last Rate Last Dose  . diphenhydrAMINE (BENADRYL) tablet 12.5 mg  12.5 mg Oral Once Carlton Adam, PA-C        SURGICAL HISTORY:  Past Surgical History  Procedure Laterality Date  . Hematoma evacuation  December 2006    groin  . Appendectomy    . Cataract extraction    . Hemorroidectomy    . Laminectomy      REVIEW OF SYSTEMS:  Constitutional: positive for fatigue and weight loss Eyes: negative Ears, nose, mouth, throat, and face: negative Respiratory: negative Cardiovascular: negative Gastrointestinal: negative Genitourinary:positive for frequency Integument/breast: negative Hematologic/lymphatic: negative Musculoskeletal:positive for muscle weakness Neurological: negative Behavioral/Psych: negative Endocrine: negative Allergic/Immunologic: negative   PHYSICAL EXAMINATION: General appearance: alert, cooperative and no distress Head: Normocephalic, without obvious abnormality, atraumatic Neck: no adenopathy Lymph nodes: Cervical, supraclavicular, and axillary nodes normal. Resp: clear to auscultation bilaterally Back: symmetric, no curvature. ROM normal. No CVA tenderness., Decubitus ulcer in the lower back Cardio: regular  rate and rhythm, S1, S2 normal, no murmur, click, rub or gallop GI: soft, non-tender; bowel sounds normal; no masses,  no organomegaly Extremities: extremities normal, atraumatic, no cyanosis or edema Neurologic: Alert and oriented X 3, normal strength and tone. Normal symmetric reflexes. Normal coordination and gait  ECOG PERFORMANCE STATUS: 2 - Symptomatic, <50% confined to bed  Blood pressure 91/59, pulse 90, temperature 98.3 F (36.8 C), temperature source Oral, resp. rate 18, height 5' 8.5" (1.74 m), SpO2 100 %.  LABORATORY DATA: Lab Results  Component Value Date   WBC 0.7* 02/10/2015   HGB 7.3* 02/10/2015   HCT 23.2* 02/10/2015   MCV 80.6 02/10/2015   PLT 91* 02/10/2015      Chemistry      Component Value Date/Time   NA 133* 02/10/2015 0847   NA 135 01/11/2015 1401   NA 143 12/23/2011 0830   K 3.1* 02/10/2015 0847   K 3.5 01/11/2015 1401   K 4.7 12/23/2011 0830   CL 101 01/11/2015 1401   CL 104 06/19/2012 0958   CL 101 12/23/2011 0830   CO2 21* 02/10/2015 0847   CO2 23 01/11/2015 1401   CO2 27 12/23/2011 0830   BUN 44.7* 02/10/2015 0847   BUN  62* 01/11/2015 1401   BUN 25* 12/23/2011 0830   CREATININE 2.9* 02/10/2015 0847   CREATININE 3.13* 01/11/2015 1401   CREATININE 2.09* 02/01/2014 1545      Component Value Date/Time   CALCIUM 7.6* 02/10/2015 0847   CALCIUM 8.2* 01/11/2015 1401   CALCIUM 9.0 12/23/2011 0830   ALKPHOS 105 02/10/2015 0847   ALKPHOS 91 12/19/2014 0630   ALKPHOS 76 12/23/2011 0830   AST 7 02/10/2015 0847   AST 11* 12/19/2014 0630   AST 16 12/23/2011 0830   ALT 7 02/10/2015 0847   ALT 7* 12/19/2014 0630   ALT 16 12/23/2011 0830   BILITOT 0.65 02/10/2015 0847   BILITOT 0.6 12/19/2014 0630   BILITOT 0.60 12/23/2011 0830       RADIOGRAPHIC STUDIES: No results found.  ASSESSMENT AND PLAN: This is a very pleasant 81 years old white female with history of stage IV non-small cell lung cancer diagnosed in March of 2007 status post 6 cycles  of systemic chemotherapy with carboplatin and docetaxel and has been observation since that time with no evidence for disease progression for almost 9 years.  She was found recently to have evidence for disease recurrence with large lumbar paraspinous mass with osseous invasion and the core biopsy was consistent with metastatic squamous cell carcinoma.  She is status post palliative radiotherapy to the soft tissue mass in the lumbar area. She is status post 1 cycle of chemotherapy with carboplatin and paclitaxel and Neulasta support. I have authorized a 30 day supply of her Lovenox with 1 refill until she is able to get things straightened out with her primary care physician regarding this medication. She will continue with weekly labs as scheduled and follow-up in 2 weeks prior to the start of cycle #2. She was given neutropenic precautions and both she and her family members voiced understanding. We will decrease the amount of Benadryl premedication in subsequent cycles of chemotherapy. She also has chemotherapy-induced anemia and I'll arrange to transfuse her total of 2 units of packed red blood cells. If she has intolerance to systemic chemotherapy or evidence for disease progression, the patient would be considered for treatment with immunotherapy For the lumbar decubitus ulcer, she will continue with care under the [facility].  The patient was advised to call immediately if she has any concerning symptoms in the interval.  All questions were answered. The patient knows to call the clinic with any problems, questions or concerns. We can certainly see the patient much sooner if necessary.  Carlton Adam, PA-C 02/10/2015   Disclaimer: This note was dictated with voice recognition software. Similar sounding words can inadvertently be transcribed and may not be corrected upon review.

## 2015-02-10 NOTE — Progress Notes (Signed)
HAR done

## 2015-02-10 NOTE — Patient Instructions (Signed)

## 2015-02-10 NOTE — Telephone Encounter (Signed)
per pof to sch pt appt-sent back to lab per pof-per pof blood trans afterwards

## 2015-02-10 NOTE — Progress Notes (Signed)
Hold Benadryl 12.5 mg tablet per Awilda Metro PA. Informed of 99.3 temp after first unit. Proceed with 2nd unit per Rollene Rotunda PA

## 2015-02-11 ENCOUNTER — Other Ambulatory Visit: Payer: Self-pay | Admitting: Oncology

## 2015-02-11 ENCOUNTER — Encounter (HOSPITAL_COMMUNITY): Payer: Self-pay | Admitting: Emergency Medicine

## 2015-02-11 ENCOUNTER — Emergency Department (HOSPITAL_COMMUNITY)
Admission: EM | Admit: 2015-02-11 | Discharge: 2015-02-12 | Disposition: A | Discharge status: Home / Self Care | Payer: Medicare Other | Attending: Emergency Medicine | Admitting: Emergency Medicine

## 2015-02-11 ENCOUNTER — Ambulatory Visit (HOSPITAL_BASED_OUTPATIENT_CLINIC_OR_DEPARTMENT_OTHER): Payer: Medicare Other

## 2015-02-11 VITALS — BP 119/68 | HR 106 | Temp 97.8°F | Resp 18

## 2015-02-11 DIAGNOSIS — R197 Diarrhea, unspecified: Secondary | ICD-10-CM | POA: Insufficient documentation

## 2015-02-11 DIAGNOSIS — C3492 Malignant neoplasm of unspecified part of left bronchus or lung: Secondary | ICD-10-CM | POA: Insufficient documentation

## 2015-02-11 DIAGNOSIS — D509 Iron deficiency anemia, unspecified: Secondary | ICD-10-CM | POA: Diagnosis not present

## 2015-02-11 DIAGNOSIS — Z79899 Other long term (current) drug therapy: Secondary | ICD-10-CM | POA: Insufficient documentation

## 2015-02-11 DIAGNOSIS — Z87891 Personal history of nicotine dependence: Secondary | ICD-10-CM | POA: Diagnosis not present

## 2015-02-11 DIAGNOSIS — K219 Gastro-esophageal reflux disease without esophagitis: Secondary | ICD-10-CM | POA: Diagnosis not present

## 2015-02-11 DIAGNOSIS — I2510 Atherosclerotic heart disease of native coronary artery without angina pectoris: Secondary | ICD-10-CM | POA: Diagnosis not present

## 2015-02-11 DIAGNOSIS — N189 Chronic kidney disease, unspecified: Secondary | ICD-10-CM | POA: Insufficient documentation

## 2015-02-11 DIAGNOSIS — Z8639 Personal history of other endocrine, nutritional and metabolic disease: Secondary | ICD-10-CM | POA: Insufficient documentation

## 2015-02-11 DIAGNOSIS — I129 Hypertensive chronic kidney disease with stage 1 through stage 4 chronic kidney disease, or unspecified chronic kidney disease: Secondary | ICD-10-CM | POA: Insufficient documentation

## 2015-02-11 DIAGNOSIS — C3400 Malignant neoplasm of unspecified main bronchus: Secondary | ICD-10-CM

## 2015-02-11 DIAGNOSIS — D649 Anemia, unspecified: Secondary | ICD-10-CM

## 2015-02-11 DIAGNOSIS — N289 Disorder of kidney and ureter, unspecified: Secondary | ICD-10-CM

## 2015-02-11 DIAGNOSIS — D701 Agranulocytosis secondary to cancer chemotherapy: Secondary | ICD-10-CM | POA: Diagnosis not present

## 2015-02-11 DIAGNOSIS — T451X5A Adverse effect of antineoplastic and immunosuppressive drugs, initial encounter: Principal | ICD-10-CM

## 2015-02-11 DIAGNOSIS — Z880 Allergy status to penicillin: Secondary | ICD-10-CM | POA: Insufficient documentation

## 2015-02-11 LAB — TYPE AND SCREEN
ABO/RH(D): O POS
Antibody Screen: NEGATIVE
UNIT DIVISION: 0
Unit division: 0

## 2015-02-11 MED ORDER — TBO-FILGRASTIM 300 MCG/0.5ML ~~LOC~~ SOSY
300.0000 ug | PREFILLED_SYRINGE | Freq: Once | SUBCUTANEOUS | Status: AC
  Administered 2015-02-11: 300 ug via SUBCUTANEOUS

## 2015-02-11 MED ORDER — DIPHENOXYLATE-ATROPINE 2.5-0.025 MG PO TABS: 1.0000 | ORAL_TABLET | Freq: Four times a day (QID) | ORAL | Status: DC | PRN

## 2015-02-11 NOTE — ED Notes (Signed)
Pt from home c/o diarrhea and vomiting. First chemo treatment was last Friday. Yesterday she was seen at Cancer center and given 2 pints of blood. Per daughter she has been giving pt imodium without relief. Pt reports left sided abdominal pain.

## 2015-02-11 NOTE — Progress Notes (Signed)
Pt caregiver reports pt has had explosive diarrhea since last evening. Pt has also had numerous episodes of vomiting. Pt denies any appetite or desire to drink with concern she will vomit immediatly after. Pt has taken 1 dose of imodium this morning. Pt caregiver requested a call to the the on-call dr for further instruction or a stronger antiemetic. Dr. Alen Blew was paged with report. Pt instructed to increase imodium to be taken after each loose stool.  Lomotil was given to the patient to bring to her pharmacy and take if increase in Imodium does not resolve diarrhea. Pt verbalized an understanding of the medication directions. Pt caregiver instructed to call office on Monday to give a status update, go to ER precautions given.

## 2015-02-11 NOTE — Patient Instructions (Signed)

## 2015-02-12 LAB — COMPREHENSIVE METABOLIC PANEL
ALK PHOS: 106 U/L (ref 38–126)
ALT: 8 U/L — AB (ref 14–54)
AST: 16 U/L (ref 15–41)
Albumin: 2.4 g/dL — ABNORMAL LOW (ref 3.5–5.0)
Anion gap: 11 (ref 5–15)
BILIRUBIN TOTAL: 0.5 mg/dL (ref 0.3–1.2)
BUN: 55 mg/dL — AB (ref 6–20)
CHLORIDE: 102 mmol/L (ref 101–111)
CO2: 20 mmol/L — AB (ref 22–32)
Calcium: 8.2 mg/dL — ABNORMAL LOW (ref 8.9–10.3)
Creatinine, Ser: 3.19 mg/dL — ABNORMAL HIGH (ref 0.44–1.00)
GFR calc Af Amer: 15 mL/min — ABNORMAL LOW (ref >60)
GFR, EST NON AFRICAN AMERICAN: 13 mL/min — AB (ref >60)
GLUCOSE: 125 mg/dL — AB (ref 65–99)
POTASSIUM: 3.5 mmol/L (ref 3.5–5.1)
Sodium: 133 mmol/L — ABNORMAL LOW (ref 135–145)
Total Protein: 6.5 g/dL (ref 6.5–8.1)

## 2015-02-12 LAB — CBC
HEMATOCRIT: 33.3 % — AB (ref 36.0–46.0)
Hemoglobin: 11.4 g/dL — ABNORMAL LOW (ref 12.0–15.0)
MCH: 27.7 pg (ref 26.0–34.0)
MCHC: 34.2 g/dL (ref 30.0–36.0)
MCV: 80.8 fL (ref 78.0–100.0)
Platelets: 84 10*3/uL — ABNORMAL LOW (ref 150–400)
RBC: 4.12 MIL/uL (ref 3.87–5.11)
RDW: 16.4 % — AB (ref 11.5–15.5)
WBC: 5 10*3/uL (ref 4.0–10.5)

## 2015-02-12 LAB — C DIFFICILE QUICK SCREEN W PCR REFLEX
C DIFFICILE (CDIFF) INTERP: NEGATIVE
C Diff antigen: NEGATIVE
C Diff toxin: NEGATIVE

## 2015-02-12 LAB — LIPASE, BLOOD: Lipase: <10 U/L — ABNORMAL LOW (ref 22–51)

## 2015-02-12 MED ORDER — SODIUM CHLORIDE 0.9 % IV SOLN
1000.0000 mL | INTRAVENOUS | Status: DC
  Administered 2015-02-12: 1000 mL via INTRAVENOUS

## 2015-02-12 MED ORDER — SODIUM CHLORIDE 0.9 % IV SOLN
1000.0000 mL | Freq: Once | INTRAVENOUS | Status: AC
  Administered 2015-02-12: 1000 mL via INTRAVENOUS

## 2015-02-12 MED ORDER — CHOLESTYRAMINE LIGHT 4 G PO PACK: 4.0000 g | PACK | Freq: Four times a day (QID) | ORAL | Status: DC

## 2015-02-12 MED ORDER — ONDANSETRON HCL 4 MG/2ML IJ SOLN
4.0000 mg | Freq: Once | INTRAMUSCULAR | Status: AC
  Administered 2015-02-12: 4 mg via INTRAVENOUS
  Filled 2015-02-12: qty 2

## 2015-02-12 NOTE — Discharge Instructions (Signed)
Continue to use loperamide (Imodium AD) or diphenoxylate (Lomotil). Try adding cholestyramine to the regimen. Follow-up with your PCP or your oncologist. If these treatments did not improve the diarrhea, he may need referral to a gastroenterologist.  Diarrhea Diarrhea is frequent loose and watery bowel movements. It can cause you to feel weak and dehydrated. Dehydration can cause you to become tired and thirsty, have a dry mouth, and have decreased urination that often is dark yellow. Diarrhea is a sign of another problem, most often an infection that will not last long. In most cases, diarrhea typically lasts 2-3 days. However, it can last longer if it is a sign of something more serious. It is important to treat your diarrhea as directed by your caregiver to lessen or prevent future episodes of diarrhea. CAUSES  Some common causes include:  Gastrointestinal infections caused by viruses, bacteria, or parasites.  Food poisoning or food allergies.  Certain medicines, such as antibiotics, chemotherapy, and laxatives.  Artificial sweeteners and fructose.  Digestive disorders. HOME CARE INSTRUCTIONS  Ensure adequate fluid intake (hydration): Have 1 cup (8 oz) of fluid for each diarrhea episode. Avoid fluids that contain simple sugars or sports drinks, fruit juices, whole milk products, and sodas. Your urine should be clear or pale yellow if you are drinking enough fluids. Hydrate with an oral rehydration solution that you can purchase at pharmacies, retail stores, and online. You can prepare an oral rehydration solution at home by mixing the following ingredients together:   - tsp table salt.   tsp baking soda.   tsp salt substitute containing potassium chloride.  1  tablespoons sugar.  1 L (34 oz) of water.  Certain foods and beverages may increase the speed at which food moves through the gastrointestinal (GI) tract. These foods and beverages should be avoided and include:  Caffeinated  and alcoholic beverages.  High-fiber foods, such as raw fruits and vegetables, nuts, seeds, and whole grain breads and cereals.  Foods and beverages sweetened with sugar alcohols, such as xylitol, sorbitol, and mannitol.  Some foods may be well tolerated and may help thicken stool including:  Starchy foods, such as rice, toast, pasta, low-sugar cereal, oatmeal, grits, baked potatoes, crackers, and bagels.  Bananas.  Applesauce.  Add probiotic-rich foods to help increase healthy bacteria in the GI tract, such as yogurt and fermented milk products.  Wash your hands well after each diarrhea episode.  Only take over-the-counter or prescription medicines as directed by your caregiver.  Take a warm bath to relieve any burning or pain from frequent diarrhea episodes. SEEK IMMEDIATE MEDICAL CARE IF:   You are unable to keep fluids down.  You have persistent vomiting.  You have blood in your stool, or your stools are black and tarry.  You do not urinate in 6-8 hours, or there is only a small amount of very dark urine.  You have abdominal pain that increases or localizes.  You have weakness, dizziness, confusion, or light-headedness.  You have a severe headache.  Your diarrhea gets worse or does not get better.  You have a fever or persistent symptoms for more than 2-3 days.  You have a fever and your symptoms suddenly get worse. MAKE SURE YOU:   Understand these instructions.  Will watch your condition.  Will get help right away if you are not doing well or get worse. Document Released: 05/10/2002 Document Revised: 10/04/2013 Document Reviewed: 01/26/2012 [facility] Patient Information 2015 [place], [place]. This information is not intended to replace advice  given to you by your health care provider. Make sure you discuss any questions you have with your health care provider.  Cholestyramine powder for oral suspension What is this medicine? CHOLESTYRAMINE (koe LESS tir a  meen) is used to lower cholesterol in patients who are at risk of heart disease or stroke. This medicine is only for patients whose cholesterol level is not controlled by diet. This medicine may be used for other purposes; ask your health care provider or pharmacist if you have questions. COMMON BRAND NAME(S): Locholest, Locholest Light, Prevalite, Questran, Questran Light What should I tell my health care provider before I take this medicine? They need to know if you have any of these conditions: -blocked bile duct -an unusual or allergic reaction to cholestyramine, other medicines, foods, dyes, or preservatives -pregnant or trying to get pregnant -breast-feeding How should I use this medicine? Do not take this medicine in the dry form. It must be mixed with a liquid before swallowing. Follow the directions on the prescription label. Place the powder in a glass or cup. Add between 2 and 6 ounces of fluid. This can be water, milk, pulpy fruit juice, fluid soup, or other liquid. Mix well and drink all of the liquid. Take your doses at regular intervals. Do not take your medicine more often than directed. Talk to your pediatrician regarding the use of this medicine in children. Special care may be needed. Overdosage: If you think you have taken too much of this medicine contact a poison control center or emergency room at once. NOTE: This medicine is only for you. Do not share this medicine with others. What if I miss a dose? If you miss a dose, take it as soon as you can. If it is almost time for your next dose, take only that dose. Do not take double or extra doses. What may interact with this medicine? -diuretics -female hormones, like estrogens or progestins and birth control pills -heart medicines such as digoxin or digitoxin -penicillin G -phenobarbital -phenylbutazone -phytonadione -propranolol -tetracycline antibiotics -thyroid hormones -vitamin A -vitamin D -vitamin  E -warfarin Take other drugs at least 1 hour before or 4 hours after this medicine, to avoid decreasing their absorption. This list may not describe all possible interactions. Give your health care provider a list of all the medicines, herbs, non-prescription drugs, or dietary supplements you use. Also tell them if you smoke, drink alcohol, or use illegal drugs. Some items may interact with your medicine. What should I watch for while using this medicine? Visit your doctor or health care professional for regular checks on your progress. Your blood fats and other tests will be measured from time to time. This medicine is only part of a total cholesterol-lowering program. Your health care professional or dietician can suggest a low-cholesterol and low-fat diet that will reduce your risk of getting heart and blood vessel disease. Avoid alcohol and smoking, and keep a proper exercise schedule. To reduce the chance of getting constipated, drink plenty of water and increase the amount of fiber in your diet. Ask your doctor or health care professional for advice if you are constipated. What side effects may I notice from receiving this medicine? Side effects that you should report to your doctor or health care professional as soon as possible: -allergic reactions like skin rash, itching or hives, swelling of the face, lips, or tongue -bloody or black, tarry stools -severe stomach pain with nausea and vomiting -unusual bleeding Side effects that usually do not  require medical attention (report to your doctor or health care professional if they continue or are bothersome): -constipation or diarrhea -dizziness -headache -heartburn, indigestion -nausea, vomiting -perianal irritation This list may not describe all possible side effects. Call your doctor for medical advice about side effects. You may report side effects to FDA at 1-800-FDA-1088. Where should I keep my medicine? Keep out of the reach of  children. Store at room temperature between 15 and 30 degrees C (59 and 86 degrees F). Throw away any unused medicine after the expiration date. NOTE: This sheet is a summary. It may not cover all possible information. If you have questions about this medicine, talk to your doctor, pharmacist, or health care provider.  2015, [employer] Standard. (2007-08-25 15:33:42)

## 2015-02-12 NOTE — ED Provider Notes (Signed)
CSN: [id number]     Arrival date & time 02/11/15  2338 History  This chart was scribed for [name], MD by Julien Nordmann, ED Scribe. This patient was seen in room WA11/WA11 and the patient's care was started at 3:17 AM.    Chief Complaint  Patient presents with  . Diarrhea  . Emesis      The history is provided by the patient. No language interpreter was used.   HPI Comments: [name] is a 81 y.o. female with a hx of HTN, hypercholesterolemia, ischemic cardiomyopathy and CAD who presents to the Emergency Department complaining of constant, gradual worsening diarrhea onset 2 days ago. Pt has been vomiting green sputum that has started this morning. Per daughter, pt has received Imodium to alleviate the diarrhea with no relief. She received antibiotics for a UTI within the past month. Pt had her first chemo treatment last Friday. Pt was seen at the cancer center and was given 2 pints of blood. Pt denies abdominal pain.  Past Medical History  Diagnosis Date  . Hypertension   . Hypercholesterolemia   . CKD (chronic kidney disease)   . Coronary artery disease     a.  LHC (06/04/05): LHC done in [place] with high grade RCA => s/p BMS to RCA;  b.  Nuclear (09/14/09): Lexiscan; Inf infarct with mild peri-infarct ishemia, EF 52%; Low Risk.  Marland Kitchen GERD (gastroesophageal reflux disease)   . Chronic anemia   . Ischemic cardiomyopathy     a. Echo (07/26/13): Mild LVH, EF 35-40%, diff HK, inf AK, Gr 2 DD, Tr AI, mildly dilated Ao root, MAC, mild MR, mild LAE, mod reduced RVSF.  . Non-small cell carcinoma of lung     Stage IV   Past Surgical History  Procedure Laterality Date  . Hematoma evacuation  December 2006    groin  . Appendectomy    . Cataract extraction    . Hemorroidectomy    . Laminectomy     Family History  Problem Relation Age of Onset  . Heart failure Mother   . Lung cancer Sister   . Lung cancer Brother   . Stomach cancer Brother   . Heart attack Neg Hx   .  Stroke Neg Hx    Social History  Substance Use Topics  . Smoking status: Former Smoker    Types: Cigarettes  . Smokeless tobacco: None  . Alcohol Use: No   OB History    No data available     Review of Systems  Gastrointestinal: Positive for vomiting and diarrhea. Negative for abdominal pain.  Musculoskeletal: Positive for joint swelling.  All other systems reviewed and are negative.     Allergies  Amlodipine; Ciprofloxacin; Mirtazapine; Statins; Aspirin; Cephalexin; Hydralazine; Iron; Sulfonamide derivatives; Penicillins; and Tape  Home Medications   Prior to Admission medications   Medication Sig Start Date End Date Taking? Authorizing Provider  cloNIDine (CATAPRES) 0.2 MG tablet Take 1 tablet (0.2 mg total) by mouth 3 (three) times daily. 11/29/14  Yes Shanker Kristeen Mans, MD  enoxaparin (LOVENOX) 80 MG/0.8ML injection Inject 0.8 mLs (80 mg total) into the skin daily. 11/29/14  Yes Shanker Kristeen Mans, MD  furosemide (LASIX) 40 MG tablet Take 1 tablet (40 mg total) by mouth daily. 02/03/14  Yes Sherren Mocha, MD  loperamide (IMODIUM A-D) 2 MG tablet Take 2 mg by mouth 4 (four) times daily as needed for diarrhea or loose stools (diarrhea).   Yes Historical Provider, MD  metoprolol succinate (  TOPROL-XL) 25 MG 24 hr tablet Take 1 tablet (25 mg total) by mouth daily. 11/29/14  Yes Shanker Kristeen Mans, MD  pantoprazole (PROTONIX) 40 MG tablet TAKE ONE TABLET BY MOUTH ONCE DAILY. 02/07/15  Yes Sherren Mocha, MD  polyethylene glycol [facility] / GLYCOLAX) packet Take 17 g by mouth daily. Patient taking differently: Take 17 g by mouth daily as needed for moderate constipation.  11/29/14  Yes Shanker Kristeen Mans, MD  potassium chloride SA (K-DUR,KLOR-CON) 20 MEQ tablet Take 1 tablet (20 mEq total) by mouth daily. 02/10/15  Yes Curt Bears, MD  prochlorperazine (COMPAZINE) 10 MG tablet Take 1 tablet (10 mg total) by mouth every 6 (six) hours as needed for nausea or vomiting. 01/25/15  Yes Curt Bears, MD  SANTYL ointment Apply 1 application topically at bedtime as needed. For bed sores 12/27/14  Yes Historical Provider, MD  diphenoxylate-atropine (LOMOTIL) 2.5-0.025 MG per tablet Take 1 tablet by mouth 4 (four) times daily as needed for diarrhea or loose stools. Patient not taking: Reported on 02/12/2015 02/11/15   Wyatt Portela, MD  HYDROcodone-acetaminophen (NORCO/VICODIN) 5-325 MG per tablet Take 1 tablet by mouth every 6 (six) hours as needed for moderate pain. Max APAP 3gm/24 hrs from all sources Patient not taking: Reported on 02/12/2015 11/30/14   Estill Dooms, MD  LORazepam (ATIVAN) 1 MG tablet Take one tablet by mouth every 8 hours for anxiety Patient not taking: Reported on 02/12/2015 11/30/14   Gildardo Cranker, DO  venlafaxine (EFFEXOR) 37.5 MG tablet Take 1 tablet (37.5 mg total) by mouth 2 (two) times daily. Patient not taking: Reported on 02/12/2015 01/11/15   Curt Bears, MD   Triage vitals: BP 108/55 mmHg  Pulse 126  Temp(Src) 97.7 F (36.5 C) (Oral)  Resp 20  SpO2 97% Physical Exam  Constitutional: She is oriented to person, place, and time. She appears well-developed and well-nourished. No distress.  HENT:  Head: Normocephalic and atraumatic.  Eyes: EOM are normal.  Neck: Normal range of motion.  Cardiovascular: Normal rate, regular rhythm and normal heart sounds.   Pulmonary/Chest: Effort normal and breath sounds normal.  Abdominal: Soft. She exhibits no distension. There is no tenderness.  Genitourinary:  Rectal decreased sphincter tone, no impaction  Musculoskeletal: Normal range of motion.  Right leg swollen with 3+ pitting edema Left leg has 1+ pitting edema  Neurological: She is alert and oriented to person, place, and time.  Skin: Skin is warm and dry.  Psychiatric: She has a normal mood and affect. Judgment normal.  Nursing note and vitals reviewed.   ED Course  Procedures  DIAGNOSTIC STUDIES: Oxygen Saturation is 97% on RA, normal by my  interpretation.  COORDINATION OF CARE:  3:21 AM Discussed treatment plan with pt's daughter at bedside and she agreed to plan.  Labs Review Results for orders placed or performed during the hospital encounter of 02/11/15  C difficile quick scan w PCR reflex  Result Value Ref Range   C Diff antigen NEGATIVE NEGATIVE   C Diff toxin NEGATIVE NEGATIVE   C Diff interpretation Negative for toxigenic C. difficile   Lipase, blood  Result Value Ref Range   Lipase <10 (L) 22 - 51 U/L  Comprehensive metabolic panel  Result Value Ref Range   Sodium 133 (L) 135 - 145 mmol/L   Potassium 3.5 3.5 - 5.1 mmol/L   Chloride 102 101 - 111 mmol/L   CO2 20 (L) 22 - 32 mmol/L   Glucose, Bld 125 (H) 65 -  99 mg/dL   BUN 55 (H) 6 - 20 mg/dL   Creatinine, Ser 3.19 (H) 0.44 - 1.00 mg/dL   Calcium 8.2 (L) 8.9 - 10.3 mg/dL   Total Protein 6.5 6.5 - 8.1 g/dL   Albumin 2.4 (L) 3.5 - 5.0 g/dL   AST 16 15 - 41 U/L   ALT 8 (L) 14 - 54 U/L   Alkaline Phosphatase 106 38 - 126 U/L   Total Bilirubin 0.5 0.3 - 1.2 mg/dL   GFR calc non Af Amer 13 (L) >60 mL/min   GFR calc Af Amer 15 (L) >60 mL/min   Anion gap 11 5 - 15  CBC  Result Value Ref Range   WBC 5.0 4.0 - 10.5 K/uL   RBC 4.12 3.87 - 5.11 MIL/uL   Hemoglobin 11.4 (L) 12.0 - 15.0 g/dL   HCT 33.3 (L) 36.0 - 46.0 %   MCV 80.8 78.0 - 100.0 fL   MCH 27.7 26.0 - 34.0 pg   MCHC 34.2 30.0 - 36.0 g/dL   RDW 16.4 (H) 11.5 - 15.5 %   Platelets 84 (L) 150 - 400 K/uL   I have personally reviewed and evaluated these images and lab results as part of my medical decision-making.  MDM   Final diagnoses:  Diarrhea  Renal insufficiency  Non-small cell lung cancer, left  Normochromic normocytic anemia    Diarrhea of uncertain cause which is not improved in spite of use of loperamide. Rectal exam shows no evidence of impaction. Specimen is sent for clustered in difficile testing and has come back negative. She was given IV hydration in the ED but has continued to  have episodes of diarrhea. Laboratory testing shows renal insufficiency which is not significantly changed from baseline. Hemoglobin has increased with recent blood transfusion. Old records are reviewed and she has been getting chemotherapy for lung cancer. A prescription has been called into the pharmacy for diphenoxylate-atropine. I am suspicious that this will not be any more efficacious loperamide. She does not have indication for admission with laboratory showing no worsening of chronic renal insufficiency. She will be tried empirically on cholestyramine resin and is given prescription for same. Family is concerned that she does not tolerate oral medications. I recommended that they try splitting the upper-outer on applesauce. Follow-up with PCP in 3 days for reevaluation. Consider consultation with gastroenterology if not improving.  I personally performed the services described in this documentation, which was scribed in my presence. The recorded information has been reviewed and is accurate.    [name], MD 15/18/34 [id number]

## 2015-02-13 ENCOUNTER — Telehealth: Payer: Self-pay | Admitting: Medical Oncology

## 2015-02-13 ENCOUNTER — Other Ambulatory Visit: Payer: Self-pay | Admitting: Medical Oncology

## 2015-02-13 DIAGNOSIS — C3490 Malignant neoplasm of unspecified part of unspecified bronchus or lung: Secondary | ICD-10-CM

## 2015-02-13 NOTE — Telephone Encounter (Signed)
Spoke to pt -her voice is strong , she has not had any diarrhea today , took lomotil yesterday.

## 2015-02-13 NOTE — Progress Notes (Signed)
Lomotil ordered by ED provder.

## 2015-02-16 ENCOUNTER — Encounter (HOSPITAL_COMMUNITY): Payer: Self-pay

## 2015-02-16 ENCOUNTER — Emergency Department (HOSPITAL_COMMUNITY): Payer: Medicare Other

## 2015-02-16 ENCOUNTER — Emergency Department (HOSPITAL_COMMUNITY)
Admission: EM | Admit: 2015-02-16 | Discharge: 2015-02-16 | Disposition: A | Discharge status: Home / Self Care | Payer: Medicare Other | Attending: Emergency Medicine | Admitting: Emergency Medicine

## 2015-02-16 ENCOUNTER — Other Ambulatory Visit: Payer: Medicare Other

## 2015-02-16 DIAGNOSIS — Z8639 Personal history of other endocrine, nutritional and metabolic disease: Secondary | ICD-10-CM | POA: Diagnosis not present

## 2015-02-16 DIAGNOSIS — K219 Gastro-esophageal reflux disease without esophagitis: Secondary | ICD-10-CM | POA: Diagnosis not present

## 2015-02-16 DIAGNOSIS — Z85118 Personal history of other malignant neoplasm of bronchus and lung: Secondary | ICD-10-CM | POA: Insufficient documentation

## 2015-02-16 DIAGNOSIS — Z862 Personal history of diseases of the blood and blood-forming organs and certain disorders involving the immune mechanism: Secondary | ICD-10-CM | POA: Diagnosis not present

## 2015-02-16 DIAGNOSIS — Z880 Allergy status to penicillin: Secondary | ICD-10-CM | POA: Insufficient documentation

## 2015-02-16 DIAGNOSIS — M7989 Other specified soft tissue disorders: Secondary | ICD-10-CM | POA: Insufficient documentation

## 2015-02-16 DIAGNOSIS — R600 Localized edema: Secondary | ICD-10-CM | POA: Diagnosis not present

## 2015-02-16 DIAGNOSIS — M79605 Pain in left leg: Secondary | ICD-10-CM | POA: Insufficient documentation

## 2015-02-16 DIAGNOSIS — N390 Urinary tract infection, site not specified: Secondary | ICD-10-CM | POA: Diagnosis not present

## 2015-02-16 DIAGNOSIS — M79675 Pain in left toe(s): Secondary | ICD-10-CM | POA: Diagnosis not present

## 2015-02-16 DIAGNOSIS — I2510 Atherosclerotic heart disease of native coronary artery without angina pectoris: Secondary | ICD-10-CM | POA: Insufficient documentation

## 2015-02-16 DIAGNOSIS — Z86718 Personal history of other venous thrombosis and embolism: Secondary | ICD-10-CM | POA: Diagnosis not present

## 2015-02-16 DIAGNOSIS — N189 Chronic kidney disease, unspecified: Secondary | ICD-10-CM | POA: Diagnosis not present

## 2015-02-16 DIAGNOSIS — M25562 Pain in left knee: Secondary | ICD-10-CM | POA: Diagnosis not present

## 2015-02-16 DIAGNOSIS — Z87891 Personal history of nicotine dependence: Secondary | ICD-10-CM | POA: Diagnosis not present

## 2015-02-16 DIAGNOSIS — I129 Hypertensive chronic kidney disease with stage 1 through stage 4 chronic kidney disease, or unspecified chronic kidney disease: Secondary | ICD-10-CM | POA: Diagnosis not present

## 2015-02-16 LAB — CBC WITH DIFFERENTIAL/PLATELET
Basophils Absolute: 0 10*3/uL (ref 0.0–0.1)
Basophils Relative: 0 %
EOS ABS: 0 10*3/uL (ref 0.0–0.7)
EOS PCT: 0 %
HEMATOCRIT: 33.5 % — AB (ref 36.0–46.0)
Hemoglobin: 11 g/dL — ABNORMAL LOW (ref 12.0–15.0)
LYMPHS ABS: 0.4 10*3/uL — AB (ref 0.7–4.0)
Lymphocytes Relative: 3 %
MCH: 27 pg (ref 26.0–34.0)
MCHC: 32.8 g/dL (ref 30.0–36.0)
MCV: 82.1 fL (ref 78.0–100.0)
MONO ABS: 0.8 10*3/uL (ref 0.1–1.0)
Monocytes Relative: 6 %
Neutro Abs: 11.8 10*3/uL — ABNORMAL HIGH (ref 1.7–7.7)
Neutrophils Relative %: 90 %
PLATELETS: 80 10*3/uL — AB (ref 150–400)
RBC: 4.08 MIL/uL (ref 3.87–5.11)
RDW: 16.5 % — AB (ref 11.5–15.5)
WBC: 13.1 10*3/uL — AB (ref 4.0–10.5)

## 2015-02-16 LAB — URINE MICROSCOPIC-ADD ON

## 2015-02-16 LAB — PROTIME-INR
INR: 1.1 (ref 0.00–1.49)
Prothrombin Time: 14.3 seconds (ref 11.6–15.2)

## 2015-02-16 LAB — BASIC METABOLIC PANEL
ANION GAP: 8 (ref 5–15)
BUN: 47 mg/dL — AB (ref 6–20)
CALCIUM: 7.4 mg/dL — AB (ref 8.9–10.3)
CO2: 21 mmol/L — AB (ref 22–32)
CREATININE: 2.93 mg/dL — AB (ref 0.44–1.00)
Chloride: 103 mmol/L (ref 101–111)
GFR calc Af Amer: 16 mL/min — ABNORMAL LOW (ref >60)
GFR, EST NON AFRICAN AMERICAN: 14 mL/min — AB (ref >60)
GLUCOSE: 106 mg/dL — AB (ref 65–99)
Potassium: 4.9 mmol/L (ref 3.5–5.1)
Sodium: 132 mmol/L — ABNORMAL LOW (ref 135–145)

## 2015-02-16 LAB — URINALYSIS, ROUTINE W REFLEX MICROSCOPIC
Bilirubin Urine: NEGATIVE
GLUCOSE, UA: NEGATIVE mg/dL
KETONES UR: NEGATIVE mg/dL
NITRITE: POSITIVE — AB
PH: 8.5 — AB (ref 5.0–8.0)
Protein, ur: 30 mg/dL — AB
SPECIFIC GRAVITY, URINE: 1.015 (ref 1.005–1.030)
Urobilinogen, UA: 0.2 mg/dL (ref 0.0–1.0)

## 2015-02-16 LAB — APTT: APTT: 44 s — AB (ref 24–37)

## 2015-02-16 MED ORDER — FOSFOMYCIN TROMETHAMINE 3 G PO PACK
3.0000 g | PACK | Freq: Once | ORAL | Status: AC
  Administered 2015-02-16: 3 g via ORAL
  Filled 2015-02-16: qty 3

## 2015-02-16 NOTE — ED Notes (Signed)
Pt daughter called for d/c transport 

## 2015-02-16 NOTE — Discharge Instructions (Signed)

## 2015-02-16 NOTE — Patient Instructions (Signed)
Continue weekly labs as scheduled Follow up in 2 weeks, prior to your next scheduled cycle of chemotherapy 

## 2015-02-16 NOTE — ED Notes (Signed)
Pt daughter-Sondra [phone].

## 2015-02-16 NOTE — ED Notes (Addendum)
Per daugther, Pt presents with  swelling in arms X2dys. Complains of pain in legs. Has 3 blood clots in one leg and 1 in the other, unsure in which legs. Takes levonox shots. Currently on chemo treatment

## 2015-02-16 NOTE — ED Provider Notes (Addendum)
CSN: [id number]     Arrival date & time 02/16/15  1023 History  This chart was scribed for [name] Chapel, MD by Irene Pap, ED Scribe. This patient was seen in room APA08/APA08 and patient care was started at 12:26 PM.   Chief Complaint  Patient presents with  . Arm Swelling   The history is provided by a relative. No language interpreter was used.    HPI Comments: [name] is a 81 y.o. female with hx of lung cancer that has metastasized to spine and who presents to the Emergency Department complaining of left leg pain, knee to toes, onset one day ago. Daughter states that pt has a hx of bilateral DVT, 3 in right leg, 1 in left. She states that the pt began to complain of pain last night and rates pain 10/10. Reports that pt took hydrocodone last night. Daughter states pt is paralyzed from the waist down due to her tumor and has a bed sore on her back. Pt has an appointment at the wound center tomorrow and takes Lovenox daily. Daughter states that pt is only able to move with assistance. Pt was recently seen at [facility] ED for vomiting and diarrhea on Saturday and Sunday after having chemotherapy on 02/03/15.   Past Medical History  Diagnosis Date  . Hypertension   . Hypercholesterolemia   . CKD (chronic kidney disease)   . Coronary artery disease     a.  LHC (06/04/05): LHC done in [place] with high grade RCA => s/p BMS to RCA;  b.  Nuclear (09/14/09): Lexiscan; Inf infarct with mild peri-infarct ishemia, EF 52%; Low Risk.  Marland Kitchen GERD (gastroesophageal reflux disease)   . Chronic anemia   . Ischemic cardiomyopathy     a. Echo (07/26/13): Mild LVH, EF 35-40%, diff HK, inf AK, Gr 2 DD, Tr AI, mildly dilated Ao root, MAC, mild MR, mild LAE, mod reduced RVSF.  . Non-small cell carcinoma of lung     Stage IV   Past Surgical History  Procedure Laterality Date  . Hematoma evacuation  December 2006    groin  . Appendectomy    . Cataract extraction    . Hemorroidectomy    .  Laminectomy     Family History  Problem Relation Age of Onset  . Heart failure Mother   . Lung cancer Sister   . Lung cancer Brother   . Stomach cancer Brother   . Heart attack Neg Hx   . Stroke Neg Hx    Social History  Substance Use Topics  . Smoking status: Former Smoker    Types: Cigarettes  . Smokeless tobacco: None  . Alcohol Use: No   OB History    No data available     Review of Systems  Cardiovascular: Positive for leg swelling.  Musculoskeletal: Positive for arthralgias.  All other systems reviewed and are negative.  Allergies  Amlodipine; Ciprofloxacin; Mirtazapine; Statins; Aspirin; Cephalexin; Hydralazine; Iron; Sulfonamide derivatives; Penicillins; and Tape  Home Medications   Prior to Admission medications   Medication Sig Start Date End Date Taking? Authorizing Provider  cholestyramine light (PREVALITE) 4 G packet Take 1 packet (4 g total) by mouth 4 (four) times daily. 4/40/34  Yes [name], MD  cloNIDine (CATAPRES) 0.2 MG tablet Take 1 tablet (0.2 mg total) by mouth 3 (three) times daily. 11/29/14  Yes Shanker Kristeen Mans, MD  diphenoxylate-atropine (LOMOTIL) 2.5-0.025 MG per tablet Take 1 tablet by mouth 4 (four) times daily as  needed for diarrhea or loose stools. 02/11/15  Yes Wyatt Portela, MD  enoxaparin (LOVENOX) 80 MG/0.8ML injection Inject 0.8 mLs (80 mg total) into the skin daily. 11/29/14  Yes Shanker Kristeen Mans, MD  furosemide (LASIX) 40 MG tablet Take 1 tablet (40 mg total) by mouth daily. 02/03/14  Yes Sherren Mocha, MD  HYDROcodone-acetaminophen (NORCO/VICODIN) 5-325 MG per tablet Take 1 tablet by mouth every 6 (six) hours as needed for moderate pain. Max APAP 3gm/24 hrs from all sources 11/30/14  Yes Estill Dooms, MD  loperamide (IMODIUM A-D) 2 MG tablet Take 2 mg by mouth 4 (four) times daily as needed for diarrhea or loose stools (diarrhea).   Yes Historical Provider, MD  metoprolol succinate (TOPROL-XL) 25 MG 24 hr tablet Take 1 tablet (25 mg  total) by mouth daily. 11/29/14  Yes Shanker Kristeen Mans, MD  pantoprazole (PROTONIX) 40 MG tablet TAKE ONE TABLET BY MOUTH ONCE DAILY. 02/07/15  Yes Sherren Mocha, MD  polyethylene glycol [facility] / GLYCOLAX) packet Take 17 g by mouth daily. Patient taking differently: Take 17 g by mouth daily as needed for moderate constipation.  11/29/14  Yes Shanker Kristeen Mans, MD  potassium chloride SA (K-DUR,KLOR-CON) 20 MEQ tablet Take 1 tablet (20 mEq total) by mouth daily. 02/10/15  Yes Curt Bears, MD  prochlorperazine (COMPAZINE) 10 MG tablet Take 1 tablet (10 mg total) by mouth every 6 (six) hours as needed for nausea or vomiting. 01/25/15  Yes Curt Bears, MD  SANTYL ointment Apply 1 application topically at bedtime as needed. For bed sores 12/27/14  Yes Historical Provider, MD  LORazepam (ATIVAN) 1 MG tablet Take one tablet by mouth every 8 hours for anxiety Patient not taking: Reported on 02/12/2015 11/30/14   Gildardo Cranker, DO  venlafaxine (EFFEXOR) 37.5 MG tablet Take 1 tablet (37.5 mg total) by mouth 2 (two) times daily. Patient not taking: Reported on 02/12/2015 01/11/15   Curt Bears, MD   BP 104/57 mmHg  Pulse 97  Temp(Src) 97.9 F (36.6 C) (Oral)  Resp 14  Ht '5\' 8"'$  (1.727 m)  Wt 120 lb (54.432 kg)  BMI 18.25 kg/m2  SpO2 99% Physical Exam  Constitutional: She appears well-developed and well-nourished. No distress.  HENT:  Head: Normocephalic and atraumatic.  Mouth/Throat: Oropharynx is clear and moist. No oropharyngeal exudate.  Eyes: Conjunctivae and EOM are normal. Pupils are equal, round, and reactive to light. Right eye exhibits no discharge. Left eye exhibits no discharge. No scleral icterus.  Neck: Normal range of motion. Neck supple. No JVD present. No thyromegaly present.  Cardiovascular: Normal rate, regular rhythm, normal heart sounds and intact distal pulses.  Exam reveals no gallop and no friction rub.   No murmur heard. Pulmonary/Chest: Effort normal and breath sounds  normal. No respiratory distress. She has no wheezes. She has no rales.  Abdominal: Soft. Bowel sounds are normal. She exhibits no distension and no mass. There is no tenderness.  Musculoskeletal: Normal range of motion. She exhibits no edema or tenderness.  Right leg has 2+ pitting edema; good pulses in left leg with no edema. Tenderness in left calf which is reproducible. Right lateral ankle has poorly healing wound (family states that this is unchanged)  Lymphadenopathy:    She has no cervical adenopathy.  Neurological: She is alert. Coordination normal.  Skin: Skin is warm and dry. No rash noted. No erythema.  Psychiatric: She has a normal mood and affect. Her behavior is normal.  Nursing note and vitals reviewed.  ED Course  Procedures (including critical care time) DIAGNOSTIC STUDIES: Oxygen Saturation is 99% on RA, normal by my interpretation.    COORDINATION OF CARE: 12:31 PM-Discussed treatment plan which includes [place] of leg and labs with pt at bedside and pt agreed to plan.   Labs Review Labs Reviewed  CBC WITH DIFFERENTIAL/PLATELET - Abnormal; Notable for the following:    WBC 13.1 (*)    Hemoglobin 11.0 (*)    HCT 33.5 (*)    RDW 16.5 (*)    Platelets 80 (*)    Neutro Abs 11.8 (*)    Lymphs Abs 0.4 (*)    All other components within normal limits  BASIC METABOLIC PANEL - Abnormal; Notable for the following:    Sodium 132 (*)    CO2 21 (*)    Glucose, Bld 106 (*)    BUN 47 (*)    Creatinine, Ser 2.93 (*)    Calcium 7.4 (*)    GFR calc non Af Amer 14 (*)    GFR calc Af Amer 16 (*)    All other components within normal limits  APTT - Abnormal; Notable for the following:    aPTT 44 (*)    All other components within normal limits  URINALYSIS, ROUTINE W REFLEX MICROSCOPIC (NOT AT [facility]) - Abnormal; Notable for the following:    APPearance CLOUDY (*)    pH 8.5 (*)    Hgb urine dipstick LARGE (*)    Protein, ur 30 (*)    Nitrite POSITIVE (*)    Leukocytes, UA  MODERATE (*)    All other components within normal limits  URINE MICROSCOPIC-ADD ON - Abnormal; Notable for the following:    Squamous Epithelial / LPF MANY (*)    Bacteria, UA MANY (*)    All other components within normal limits  PROTIME-INR    Imaging Review US Venous Img Lower Unilateral Left  02/16/2015   CLINICAL DATA:  Left lower extremity pain and edema. History of smoking and prior DVT. Evaluate for acute or chronic DVT.  EXAM: LEFT LOWER EXTREMITY VENOUS DOPPLER ULTRASOUND  TECHNIQUE: Gray-scale sonography with graded compression, as well as color Doppler and duplex ultrasound were performed to evaluate the lower extremity deep venous systems from the level of the common femoral vein and including the common femoral, femoral, profunda femoral, popliteal and calf veins including the posterior tibial, peroneal and gastrocnemius veins when visible. The superficial great saphenous vein was also interrogated. Spectral Doppler was utilized to evaluate flow at rest and with distal augmentation maneuvers in the common femoral, femoral and popliteal veins.  COMPARISON:  None.  FINDINGS: Contralateral Common Femoral Vein: Respiratory phasicity is normal and symmetric with the symptomatic side. No evidence of thrombus. Normal compressibility.  Common Femoral Vein: No evidence of thrombus. Normal compressibility, respiratory phasicity and response to augmentation.  Saphenofemoral Junction: No evidence of thrombus. Normal compressibility and flow on color Doppler imaging.  Profunda Femoral Vein: No evidence of thrombus. Normal compressibility and flow on color Doppler imaging.  Femoral Vein: No evidence of thrombus. Normal compressibility, respiratory phasicity and response to augmentation.  Popliteal Vein: No evidence of thrombus. Normal compressibility, respiratory phasicity and response to augmentation.  Calf Veins: No evidence of thrombus. Normal compressibility and flow on color Doppler imaging.   Superficial Great Saphenous Vein: No evidence of thrombus. Normal compressibility and flow on color Doppler imaging.  Venous Reflux:  None.  Other Findings:  None.  IMPRESSION: No evidence of acute or chronic DVT  within the left lower extremity.   Electronically Signed   By: Sandi Mariscal M.D.   On: 02/16/2015 15:02      MDM   Final diagnoses:  None    The pt has baseline renal function - the [place] is neg for DVT - labs show UTI - slightly WBC - will give fosfomycin due to her other allergies. - d/w pharmacy - agreeable to this choice.  I personally performed the services described in this documentation, which was scribed in my presence. The recorded information has been reviewed and is accurate.    Meds given in ED:  Medications  fosfomycin (MONUROL) packet 3 g (not administered)    New Prescriptions   No medications on file         [name] Chapel, MD 02/16/15 [place], MD 02/16/15 [phone]

## 2015-02-17 ENCOUNTER — Encounter (HOSPITAL_BASED_OUTPATIENT_CLINIC_OR_DEPARTMENT_OTHER): Payer: Medicare Other | Attending: Internal Medicine

## 2015-02-17 ENCOUNTER — Other Ambulatory Visit (HOSPITAL_BASED_OUTPATIENT_CLINIC_OR_DEPARTMENT_OTHER): Payer: Medicare Other

## 2015-02-17 ENCOUNTER — Telehealth: Payer: Self-pay | Admitting: Medical Oncology

## 2015-02-17 ENCOUNTER — Other Ambulatory Visit: Payer: Self-pay | Admitting: Cardiovascular Disease

## 2015-02-17 ENCOUNTER — Other Ambulatory Visit: Payer: Medicare Other

## 2015-02-17 DIAGNOSIS — Z85830 Personal history of malignant neoplasm of bone: Secondary | ICD-10-CM | POA: Insufficient documentation

## 2015-02-17 DIAGNOSIS — C7951 Secondary malignant neoplasm of bone: Secondary | ICD-10-CM | POA: Diagnosis not present

## 2015-02-17 DIAGNOSIS — I120 Hypertensive chronic kidney disease with stage 5 chronic kidney disease or end stage renal disease: Secondary | ICD-10-CM | POA: Insufficient documentation

## 2015-02-17 DIAGNOSIS — G629 Polyneuropathy, unspecified: Secondary | ICD-10-CM | POA: Diagnosis not present

## 2015-02-17 DIAGNOSIS — Z85118 Personal history of other malignant neoplasm of bronchus and lung: Secondary | ICD-10-CM | POA: Diagnosis not present

## 2015-02-17 DIAGNOSIS — Z923 Personal history of irradiation: Secondary | ICD-10-CM | POA: Diagnosis not present

## 2015-02-17 DIAGNOSIS — N186 End stage renal disease: Secondary | ICD-10-CM | POA: Insufficient documentation

## 2015-02-17 DIAGNOSIS — F419 Anxiety disorder, unspecified: Secondary | ICD-10-CM | POA: Insufficient documentation

## 2015-02-17 DIAGNOSIS — M109 Gout, unspecified: Secondary | ICD-10-CM | POA: Insufficient documentation

## 2015-02-17 DIAGNOSIS — I2510 Atherosclerotic heart disease of native coronary artery without angina pectoris: Secondary | ICD-10-CM | POA: Diagnosis not present

## 2015-02-17 DIAGNOSIS — C3492 Malignant neoplasm of unspecified part of left bronchus or lung: Secondary | ICD-10-CM

## 2015-02-17 DIAGNOSIS — C3490 Malignant neoplasm of unspecified part of unspecified bronchus or lung: Secondary | ICD-10-CM

## 2015-02-17 DIAGNOSIS — Z86718 Personal history of other venous thrombosis and embolism: Secondary | ICD-10-CM | POA: Insufficient documentation

## 2015-02-17 DIAGNOSIS — L89154 Pressure ulcer of sacral region, stage 4: Secondary | ICD-10-CM | POA: Diagnosis not present

## 2015-02-17 DIAGNOSIS — L89512 Pressure ulcer of right ankle, stage 2: Secondary | ICD-10-CM | POA: Insufficient documentation

## 2015-02-17 LAB — COMPREHENSIVE METABOLIC PANEL (CC13)
ALBUMIN: 1.9 g/dL — AB (ref 3.5–5.0)
ALK PHOS: 165 U/L — AB (ref 40–150)
ALT: <6 U/L (ref 0–55)
ANION GAP: 11 meq/L (ref 3–11)
AST: <7 U/L (ref 5–34)
BILIRUBIN TOTAL: 0.42 mg/dL (ref 0.20–1.20)
BUN: 47.5 mg/dL — ABNORMAL HIGH (ref 7.0–26.0)
CALCIUM: 8.4 mg/dL (ref 8.4–10.4)
CO2: 19 meq/L — AB (ref 22–29)
Chloride: 106 mEq/L (ref 98–109)
Creatinine: 2.9 mg/dL — ABNORMAL HIGH (ref 0.6–1.1)
EGFR: 15 mL/min/{1.73_m2} — AB (ref >90)
Glucose: 109 mg/dl (ref 70–140)
Potassium: 4.2 mEq/L (ref 3.5–5.1)
Sodium: 136 mEq/L (ref 136–145)
TOTAL PROTEIN: 6.1 g/dL — AB (ref 6.4–8.3)

## 2015-02-17 LAB — CBC WITH DIFFERENTIAL/PLATELET
BASO%: 0.1 % (ref 0.0–2.0)
BASOS ABS: 0 10*3/uL (ref 0.0–0.1)
EOS ABS: 0 10*3/uL (ref 0.0–0.5)
EOS%: 0 % (ref 0.0–7.0)
HCT: 34.8 % (ref 34.8–46.6)
HEMOGLOBIN: 11.1 g/dL — AB (ref 11.6–15.9)
LYMPH%: 3 % — ABNORMAL LOW (ref 14.0–49.7)
MCH: 26.4 pg (ref 25.1–34.0)
MCHC: 32 g/dL (ref 31.5–36.0)
MCV: 82.7 fL (ref 79.5–101.0)
MONO#: 0.7 10*3/uL (ref 0.1–0.9)
MONO%: 4.9 % (ref 0.0–14.0)
NEUT#: 13.1 10*3/uL — ABNORMAL HIGH (ref 1.5–6.5)
NEUT%: 92 % — ABNORMAL HIGH (ref 38.4–76.8)
PLATELETS: 130 10*3/uL — AB (ref 145–400)
RBC: 4.21 10*6/uL (ref 3.70–5.45)
RDW: 17.4 % — AB (ref 11.2–14.5)
WBC: 14.2 10*3/uL — ABNORMAL HIGH (ref 3.9–10.3)
lymph#: 0.4 10*3/uL — ABNORMAL LOW (ref 0.9–3.3)

## 2015-02-17 LAB — HOLD TUBE, BLOOD BANK

## 2015-02-17 NOTE — Telephone Encounter (Signed)
Home health referral made to [facility] home health and hospice just for home health assessment.

## 2015-02-22 ENCOUNTER — Encounter: Payer: Self-pay | Admitting: Internal Medicine

## 2015-02-22 NOTE — Progress Notes (Signed)
I placed [employer] [employer] form on desk of nurse for dr. Mckinley Jewel

## 2015-02-23 ENCOUNTER — Telehealth: Payer: Self-pay | Admitting: Internal Medicine

## 2015-02-23 ENCOUNTER — Encounter: Payer: Self-pay | Admitting: Internal Medicine

## 2015-02-23 ENCOUNTER — Other Ambulatory Visit: Payer: Medicare Other

## 2015-02-23 ENCOUNTER — Ambulatory Visit: Payer: Medicare Other

## 2015-02-23 ENCOUNTER — Ambulatory Visit (HOSPITAL_BASED_OUTPATIENT_CLINIC_OR_DEPARTMENT_OTHER): Payer: Medicare Other

## 2015-02-23 ENCOUNTER — Other Ambulatory Visit (HOSPITAL_BASED_OUTPATIENT_CLINIC_OR_DEPARTMENT_OTHER): Payer: Medicare Other

## 2015-02-23 ENCOUNTER — Ambulatory Visit (HOSPITAL_BASED_OUTPATIENT_CLINIC_OR_DEPARTMENT_OTHER): Payer: Medicare Other | Admitting: Internal Medicine

## 2015-02-23 VITALS — BP 88/40 | HR 80 | Temp 97.6°F | Resp 17

## 2015-02-23 DIAGNOSIS — D638 Anemia in other chronic diseases classified elsewhere: Secondary | ICD-10-CM | POA: Diagnosis not present

## 2015-02-23 DIAGNOSIS — C7951 Secondary malignant neoplasm of bone: Secondary | ICD-10-CM

## 2015-02-23 DIAGNOSIS — Z5189 Encounter for other specified aftercare: Secondary | ICD-10-CM

## 2015-02-23 DIAGNOSIS — C3492 Malignant neoplasm of unspecified part of left bronchus or lung: Secondary | ICD-10-CM

## 2015-02-23 DIAGNOSIS — Z5111 Encounter for antineoplastic chemotherapy: Secondary | ICD-10-CM | POA: Diagnosis not present

## 2015-02-23 DIAGNOSIS — I959 Hypotension, unspecified: Secondary | ICD-10-CM | POA: Insufficient documentation

## 2015-02-23 DIAGNOSIS — I951 Orthostatic hypotension: Secondary | ICD-10-CM

## 2015-02-23 DIAGNOSIS — C3491 Malignant neoplasm of unspecified part of right bronchus or lung: Secondary | ICD-10-CM

## 2015-02-23 LAB — COMPREHENSIVE METABOLIC PANEL (CC13)
AST: 7 U/L (ref 5–34)
Albumin: 1.7 g/dL — ABNORMAL LOW (ref 3.5–5.0)
Alkaline Phosphatase: 133 U/L (ref 40–150)
Anion Gap: 8 mEq/L (ref 3–11)
BUN: 45.8 mg/dL — AB (ref 7.0–26.0)
CALCIUM: 7.9 mg/dL — AB (ref 8.4–10.4)
CHLORIDE: 105 meq/L (ref 98–109)
CO2: 22 meq/L (ref 22–29)
CREATININE: 2.3 mg/dL — AB (ref 0.6–1.1)
EGFR: 19 mL/min/{1.73_m2} — ABNORMAL LOW (ref >90)
GLUCOSE: 101 mg/dL (ref 70–140)
POTASSIUM: 4.4 meq/L (ref 3.5–5.1)
SODIUM: 135 meq/L — AB (ref 136–145)
Total Bilirubin: 0.33 mg/dL (ref 0.20–1.20)
Total Protein: 5.7 g/dL — ABNORMAL LOW (ref 6.4–8.3)

## 2015-02-23 LAB — CBC WITH DIFFERENTIAL/PLATELET
BASO%: 0.6 % (ref 0.0–2.0)
Basophils Absolute: 0 10*3/uL (ref 0.0–0.1)
EOS%: 0 % (ref 0.0–7.0)
Eosinophils Absolute: 0 10*3/uL (ref 0.0–0.5)
HEMATOCRIT: 28.5 % — AB (ref 34.8–46.6)
HGB: 9.3 g/dL — ABNORMAL LOW (ref 11.6–15.9)
LYMPH#: 0.5 10*3/uL — AB (ref 0.9–3.3)
LYMPH%: 5.4 % — ABNORMAL LOW (ref 14.0–49.7)
MCH: 26.8 pg (ref 25.1–34.0)
MCHC: 32.7 g/dL (ref 31.5–36.0)
MCV: 82.1 fL (ref 79.5–101.0)
MONO#: 0.6 10*3/uL (ref 0.1–0.9)
MONO%: 6.5 % (ref 0.0–14.0)
NEUT#: 7.5 10*3/uL — ABNORMAL HIGH (ref 1.5–6.5)
NEUT%: 87.5 % — AB (ref 38.4–76.8)
Platelets: 289 10*3/uL (ref 145–400)
RBC: 3.47 10*6/uL — ABNORMAL LOW (ref 3.70–5.45)
RDW: 17.2 % — AB (ref 11.2–14.5)
WBC: 8.6 10*3/uL (ref 3.9–10.3)

## 2015-02-23 MED ORDER — FAMOTIDINE IN NACL 20-0.9 MG/50ML-% IV SOLN
20.0000 mg | Freq: Once | INTRAVENOUS | Status: AC
  Administered 2015-02-23: 20 mg via INTRAVENOUS

## 2015-02-23 MED ORDER — FAMOTIDINE IN NACL 20-0.9 MG/50ML-% IV SOLN
INTRAVENOUS | Status: AC
  Filled 2015-02-23: qty 50

## 2015-02-23 MED ORDER — SODIUM CHLORIDE 0.9 % IV SOLN
INTRAVENOUS | Status: DC
  Administered 2015-02-23: 12:00:00 via INTRAVENOUS

## 2015-02-23 MED ORDER — SODIUM CHLORIDE 0.9 % IV SOLN
Freq: Once | INTRAVENOUS | Status: AC
  Administered 2015-02-23: 14:00:00 via INTRAVENOUS
  Filled 2015-02-23: qty 8

## 2015-02-23 MED ORDER — PEGFILGRASTIM 6 MG/0.6ML ~~LOC~~ PSKT
6.0000 mg | PREFILLED_SYRINGE | Freq: Once | SUBCUTANEOUS | Status: AC
  Administered 2015-02-23: 6 mg via SUBCUTANEOUS
  Filled 2015-02-23: qty 0.6

## 2015-02-23 MED ORDER — CARBOPLATIN CHEMO INJECTION 450 MG/45ML
212.0000 mg | Freq: Once | INTRAVENOUS | Status: AC
  Administered 2015-02-23: 210 mg via INTRAVENOUS
  Filled 2015-02-23: qty 21

## 2015-02-23 MED ORDER — DIPHENHYDRAMINE HCL 50 MG/ML IJ SOLN
50.0000 mg | Freq: Once | INTRAMUSCULAR | Status: AC
  Administered 2015-02-23: 50 mg via INTRAVENOUS

## 2015-02-23 MED ORDER — PACLITAXEL CHEMO INJECTION 300 MG/50ML
175.0000 mg/m2 | Freq: Once | INTRAVENOUS | Status: AC
  Administered 2015-02-23: 282 mg via INTRAVENOUS
  Filled 2015-02-23: qty 47

## 2015-02-23 MED ORDER — CARBOPLATIN CHEMO INTRADERMAL TEST DOSE 100MCG/0.02ML
100.0000 ug | Freq: Once | INTRADERMAL | Status: AC
  Administered 2015-02-23: 100 ug via INTRADERMAL
  Filled 2015-02-23: qty 0.01

## 2015-02-23 MED ORDER — PACLITAXEL CHEMO INJECTION 300 MG/50ML: 175.0000 mg/m2 | Freq: Once | INTRAVENOUS | Status: DC

## 2015-02-23 MED ORDER — DIPHENHYDRAMINE HCL 50 MG/ML IJ SOLN
INTRAMUSCULAR | Status: AC
  Filled 2015-02-23: qty 1

## 2015-02-23 MED ORDER — SODIUM CHLORIDE 0.9 % IV SOLN
Freq: Once | INTRAVENOUS | Status: AC
  Administered 2015-02-23: 13:00:00 via INTRAVENOUS

## 2015-02-23 NOTE — Progress Notes (Signed)
I called and left a message for daughter sondra [phone] to let her know forms are ready and I have faxed them to [phone] and made a copy for their records

## 2015-02-23 NOTE — Patient Instructions (Signed)
Dehydration, Adult Dehydration is when you lose more fluids from the body than you take in. Vital organs like the kidneys, brain, and heart cannot function without a proper amount of fluids and salt. Any loss of fluids from the body can cause dehydration.  CAUSES  1. Vomiting. 2. Diarrhea. 3. Excessive sweating. 4. Excessive urine output. 5. Fever. SYMPTOMS  Mild dehydration 1. Thirst. 2. Dry lips. 3. Slightly dry mouth. Moderate dehydration  Very dry mouth.  Sunken eyes.  Skin does not bounce back quickly when lightly pinched and released.  Dark urine and decreased urine production.  Decreased tear production.  Headache. Severe dehydration  Very dry mouth.  Extreme thirst.  Rapid, weak pulse (more than 100 beats per minute at rest).  Cold hands and feet.  Not able to sweat in spite of heat and temperature.  Rapid breathing.  Blue lips.  Confusion and lethargy.  Difficulty being awakened.  Minimal urine production.  No tears. DIAGNOSIS  Your caregiver will diagnose dehydration based on your symptoms and your exam. Blood and urine tests will help confirm the diagnosis. The diagnostic evaluation should also identify the cause of dehydration. TREATMENT  Treatment of mild or moderate dehydration can often be done at home by increasing the amount of fluids that you drink. It is best to drink small amounts of fluid more often. Drinking too much at one time can make vomiting worse. Refer to the home care instructions below. Severe dehydration needs to be treated at the hospital where you will probably be given intravenous (IV) fluids that contain water and electrolytes. HOME CARE INSTRUCTIONS   Ask your caregiver about specific rehydration instructions.  Drink enough fluids to keep your urine clear or pale yellow.  Drink small amounts frequently if you have nausea and vomiting.  Eat as you normally do.  Avoid:  Foods or drinks high in sugar.  Carbonated  drinks.  Juice.  Extremely hot or cold fluids.  Drinks with caffeine.  Fatty, greasy foods.  Alcohol.  Tobacco.  Overeating.  Gelatin desserts.  Wash your hands well to avoid spreading bacteria and viruses.  Only take over-the-counter or prescription medicines for pain, discomfort, or fever as directed by your caregiver.  Ask your caregiver if you should continue all prescribed and over-the-counter medicines.  Keep all follow-up appointments with your caregiver. SEEK MEDICAL CARE IF:  You have abdominal pain and it increases or stays in one area (localizes).  You have a rash, stiff neck, or severe headache.  You are irritable, sleepy, or difficult to awaken.  You are weak, dizzy, or extremely thirsty. SEEK IMMEDIATE MEDICAL CARE IF:   You are unable to keep fluids down or you get worse despite treatment.  You have frequent episodes of vomiting or diarrhea.  You have blood or green matter (bile) in your vomit.  You have blood in your stool or your stool looks black and tarry.  You have not urinated in 6 to 8 hours, or you have only urinated a small amount of very dark urine.  You have a fever.  You faint. MAKE SURE YOU:   Understand these instructions.  Will watch your condition.  Will get help right away if you are not doing well or get worse. Document Released: 05/20/2005 Document Revised: 08/12/2011 Document Reviewed: 01/07/2011 [facility] Patient Information 2015 [place], [place]. This information is not intended to replace advice given to you by your health care provider. Make sure you discuss any questions you have with your health care  provider.  [facility] Discharge Instructions for Patients Receiving Chemotherapy  Today you received the following chemotherapy agents Taxol/Carboplatin.  To help prevent nausea and vomiting after your treatment, we encourage you to take your nausea medication as directed.   If you develop nausea and  vomiting that is not controlled by your nausea medication, call the clinic.   BELOW ARE SYMPTOMS THAT SHOULD BE REPORTED IMMEDIATELY:  *FEVER GREATER THAN 100.5 F  *CHILLS WITH OR WITHOUT FEVER  NAUSEA AND VOMITING THAT IS NOT CONTROLLED WITH YOUR NAUSEA MEDICATION  *UNUSUAL SHORTNESS OF BREATH  *UNUSUAL BRUISING OR BLEEDING  TENDERNESS IN MOUTH AND THROAT WITH OR WITHOUT PRESENCE OF ULCERS  *URINARY PROBLEMS  *BOWEL PROBLEMS  UNUSUAL RASH Items with * indicate a potential emergency and should be followed up as soon as possible.  Feel free to call the clinic you have any questions or concerns. The clinic phone number is [phone].  Please show the [facility] at check-in to the Emergency Department and triage nurse.

## 2015-02-23 NOTE — Telephone Encounter (Signed)
Pt confirmed labs/ov per 09/22 POF, gave pt AVS and Calendar... KJ °

## 2015-02-23 NOTE — Progress Notes (Signed)
Per Dr. Julien Nordmann, okay to treat with today's creatinine.  Pt to receive 566m bolus prior to chemotherapy.

## 2015-02-23 NOTE — Progress Notes (Signed)
[facility] Telephone:([phone]   Fax:([phone]  OFFICE PROGRESS NOTE  Wende Neighbors, MD [place] [address]  PRINCIPAL DIAGNOSIS: Stage IV non-small cell lung cancer diagnosed in March 2007, with disease recurrence in June 2016 with positive PDL 1 expression.   PRIOR THERAPY:  1) Status post 6 cycles of systemic chemotherapy with carboplatin and docetaxel. Last dose was given January 16, 2006.  2) Palliative radiotherapy to the large right paraspinous mass with osseous invasion centered at L5 under the care of Dr. Valere Dross.  CURRENT THERAPY: Systemic chemotherapy with carboplatin for AUC of 5 and paclitaxel 175 MG/M2 every 3 weeks with Neulasta support. First dose expected on 02/02/2015. Status post one cycle.  DISEASE STAGE: Stage IV non-small cell lung cancer diagnosed in March of 2007.  CHEMOTHERAPY INTENT: Palliative  CURRENT # OF CHEMOTHERAPY CYCLES: 1  CURRENT ANTIEMETICS: None  CURRENT SMOKING STATUS: Nonsmoker  ORAL CHEMOTHERAPY AND CONSENT: None  CURRENT BISPHOSPHONATES USE: None  LIVING WILL AND CODE STATUS: Full code  INTERVAL HISTORY: [name] 81 y.o. female returns to the clinic today for followup visit accompanied by her daughter and grandson. The patient continues to complain of increasing fatigue and weakness as well as weight loss. She tolerated the first cycle of her systemic chemotherapy fairly well except for chemotherapy-induced anemia and the patient required 2 units of PRBCs transfusion. She denied having any significant chest pain, shortness of breath, cough or hemoptysis. She has no nausea or vomiting. She denied having any significant fever or chills. She is here today to start cycle #2.  MEDICAL HISTORY: Past Medical History  Diagnosis Date  . Hypertension   . Hypercholesterolemia   . CKD (chronic kidney disease)   . Coronary artery disease     a.  LHC (06/04/05): LHC done in [place] with  high grade RCA => s/p BMS to RCA;  b.  Nuclear (09/14/09): Lexiscan; Inf infarct with mild peri-infarct ishemia, EF 52%; Low Risk.  [facility] GERD (gastroesophageal reflux disease)   . Chronic anemia   . Ischemic cardiomyopathy     a. Echo (07/26/13): Mild LVH, EF 35-40%, diff HK, inf AK, Gr 2 DD, Tr AI, mildly dilated Ao root, MAC, mild MR, mild LAE, mod reduced RVSF.  . Non-small cell carcinoma of lung     Stage IV    ALLERGIES:  is allergic to amlodipine; ciprofloxacin; mirtazapine; statins; aspirin; cephalexin; hydralazine; iron; sulfonamide derivatives; penicillins; and tape.  MEDICATIONS:  Current Outpatient Prescriptions  Medication Sig Dispense Refill  . cholestyramine light (PREVALITE) 4 G packet Take 1 packet (4 g total) by mouth 4 (four) times daily. 40 packet 0  . cloNIDine (CATAPRES) 0.2 MG tablet Take 1 tablet (0.2 mg total) by mouth 3 (three) times daily.    . diphenoxylate-atropine (LOMOTIL) 2.5-0.025 MG per tablet Take 1 tablet by mouth 4 (four) times daily as needed for diarrhea or loose stools. 30 tablet 0  . enoxaparin (LOVENOX) 80 MG/0.8ML injection Inject 0.8 mLs (80 mg total) into the skin daily.    . furosemide (LASIX) 40 MG tablet TAKE ONE TABLET BY MOUTH DAILY. 30 tablet 1  . HYDROcodone-acetaminophen (NORCO/VICODIN) 5-325 MG per tablet Take 1 tablet by mouth every 6 (six) hours as needed for moderate pain. Max APAP 3gm/24 hrs from all sources 120 tablet 0  . loperamide (IMODIUM A-D) 2 MG tablet Take 2 mg by mouth 4 (four) times daily as needed for diarrhea or loose  stools (diarrhea).    . LORazepam (ATIVAN) 1 MG tablet Take one tablet by mouth every 8 hours for anxiety (Patient not taking: Reported on 02/12/2015) 90 tablet 5  . metoprolol succinate (TOPROL-XL) 25 MG 24 hr tablet Take 1 tablet (25 mg total) by mouth daily.    . pantoprazole (PROTONIX) 40 MG tablet TAKE ONE TABLET BY MOUTH ONCE DAILY. 90 tablet 0  . polyethylene glycol (MIRALAX / GLYCOLAX) packet Take 17 g  by mouth daily. (Patient taking differently: Take 17 g by mouth daily as needed for moderate constipation. )    . potassium chloride SA (K-DUR,KLOR-CON) 20 MEQ tablet Take 1 tablet (20 mEq total) by mouth daily. 7 tablet 0  . prochlorperazine (COMPAZINE) 10 MG tablet Take 1 tablet (10 mg total) by mouth every 6 (six) hours as needed for nausea or vomiting. 30 tablet 0  . SANTYL ointment Apply 1 application topically at bedtime as needed. For bed sores    . venlafaxine (EFFEXOR) 37.5 MG tablet Take 1 tablet (37.5 mg total) by mouth 2 (two) times daily. (Patient not taking: Reported on 02/12/2015) 60 tablet 0   No current facility-administered medications for this visit.    SURGICAL HISTORY:  Past Surgical History  Procedure Laterality Date  . Hematoma evacuation  December 2006    groin  . Appendectomy    . Cataract extraction    . Hemorroidectomy    . Laminectomy      REVIEW OF SYSTEMS:  A comprehensive review of systems was negative except for: Constitutional: positive for anorexia, fatigue and weight loss Musculoskeletal: positive for arthralgias, back pain and muscle weakness   PHYSICAL EXAMINATION: General appearance: alert, cooperative and no distress Head: Normocephalic, without obvious abnormality, atraumatic Neck: no adenopathy Lymph nodes: Cervical, supraclavicular, and axillary nodes normal. Resp: clear to auscultation bilaterally Back: symmetric, no curvature. ROM normal. No CVA tenderness., Decubitus ulcer in the lower back Cardio: regular rate and rhythm, S1, S2 normal, no murmur, click, rub or gallop GI: soft, non-tender; bowel sounds normal; no masses,  no organomegaly Extremities: extremities normal, atraumatic, no cyanosis or edema Neurologic: Alert and oriented X 3, normal strength and tone. Normal symmetric reflexes. Normal coordination and gait  ECOG PERFORMANCE STATUS: 2 - Symptomatic, <50% confined to bed  Blood pressure 88/40, pulse 80, temperature 97.6 F  (36.4 C), temperature source Oral, resp. rate 17, SpO2 100 %.  LABORATORY DATA: Lab Results  Component Value Date   WBC 8.6 02/23/2015   HGB 9.3* 02/23/2015   HCT 28.5* 02/23/2015   MCV 82.1 02/23/2015   PLT 289 02/23/2015      Chemistry      Component Value Date/Time   NA 136 02/17/2015 0903   NA 132* 02/16/2015 1226   NA 143 12/23/2011 0830   K 4.2 02/17/2015 0903   K 4.9 02/16/2015 1226   K 4.7 12/23/2011 0830   CL 103 02/16/2015 1226   CL 104 06/19/2012 0958   CL 101 12/23/2011 0830   CO2 19* 02/17/2015 0903   CO2 21* 02/16/2015 1226   CO2 27 12/23/2011 0830   BUN 47.5* 02/17/2015 0903   BUN 47* 02/16/2015 1226   BUN 25* 12/23/2011 0830   CREATININE 2.9* 02/17/2015 0903   CREATININE 2.93* 02/16/2015 1226   CREATININE 2.09* 02/01/2014 1545      Component Value Date/Time   CALCIUM 8.4 02/17/2015 0903   CALCIUM 7.4* 02/16/2015 1226   CALCIUM 9.0 12/23/2011 0830   ALKPHOS 165* 02/17/2015 0903  ALKPHOS 106 02/12/2015 0009   ALKPHOS 76 12/23/2011 0830   AST <7 02/17/2015 0903   AST 16 02/12/2015 0009   AST 16 12/23/2011 0830   ALT <6 02/17/2015 0903   ALT 8* 02/12/2015 0009   ALT 16 12/23/2011 0830   BILITOT 0.42 02/17/2015 0903   BILITOT 0.5 02/12/2015 0009   BILITOT 0.60 12/23/2011 0830       RADIOGRAPHIC STUDIES: US Venous Img Lower Unilateral Left  02/16/2015   CLINICAL DATA:  Left lower extremity pain and edema. History of smoking and prior DVT. Evaluate for acute or chronic DVT.  EXAM: LEFT LOWER EXTREMITY VENOUS DOPPLER ULTRASOUND  TECHNIQUE: Gray-scale sonography with graded compression, as well as color Doppler and duplex ultrasound were performed to evaluate the lower extremity deep venous systems from the level of the common femoral vein and including the common femoral, femoral, profunda femoral, popliteal and calf veins including the posterior tibial, peroneal and gastrocnemius veins when visible. The superficial great saphenous vein was also  interrogated. Spectral Doppler was utilized to evaluate flow at rest and with distal augmentation maneuvers in the common femoral, femoral and popliteal veins.  COMPARISON:  None.  FINDINGS: Contralateral Common Femoral Vein: Respiratory phasicity is normal and symmetric with the symptomatic side. No evidence of thrombus. Normal compressibility.  Common Femoral Vein: No evidence of thrombus. Normal compressibility, respiratory phasicity and response to augmentation.  Saphenofemoral Junction: No evidence of thrombus. Normal compressibility and flow on color Doppler imaging.  Profunda Femoral Vein: No evidence of thrombus. Normal compressibility and flow on color Doppler imaging.  Femoral Vein: No evidence of thrombus. Normal compressibility, respiratory phasicity and response to augmentation.  Popliteal Vein: No evidence of thrombus. Normal compressibility, respiratory phasicity and response to augmentation.  Calf Veins: No evidence of thrombus. Normal compressibility and flow on color Doppler imaging.  Superficial Great Saphenous Vein: No evidence of thrombus. Normal compressibility and flow on color Doppler imaging.  Venous Reflux:  None.  Other Findings:  None.  IMPRESSION: No evidence of acute or chronic DVT within the left lower extremity.   Electronically Signed   By: Sandi Mariscal M.D.   On: 02/16/2015 15:02    ASSESSMENT AND PLAN: This is a very pleasant 81 years old white female with history of stage IV non-small cell lung cancer diagnosed in March of 2007 status post 6 cycles of systemic chemotherapy with carboplatin and docetaxel and has been observation since that time with no evidence for disease progression for almost 9 years.  She was found recently to have evidence for disease recurrence with large lumbar paraspinous mass with osseous invasion and the core biopsy was consistent with metastatic squamous cell carcinoma.  She is status post palliative radiotherapy to the soft tissue mass in the lumbar  area. The patient is currently undergoing systemic chemotherapy with carboplatin and paclitaxel is status post 1 cycle and tolerated the first cycle well except for the fatigue secondary to chemotherapy-induced anemia and she received 2 units of PRBCs transfusion. I recommended for her to proceed with cycle #2 today as scheduled. For the lumbar decubitus ulcer, I referred the patient to the [facility]. for evaluation. She is expected to see them tomorrow. The patient would come back for follow-up visit in 3 weeks for evaluation and management of any adverse effect of her chemotherapy before starting cycle #3. The patient was advised to call immediately if she has any concerning symptoms in the interval.  All questions were answered. The patient knows  to call the clinic with any problems, questions or concerns. We can certainly see the patient much sooner if necessary.  Disclaimer: This note was dictated with voice recognition software. Similar sounding words can inadvertently be transcribed and may not be corrected upon review.

## 2015-02-25 ENCOUNTER — Ambulatory Visit: Payer: Medicare Other

## 2015-02-27 ENCOUNTER — Telehealth: Payer: Self-pay | Admitting: *Deleted

## 2015-02-27 DIAGNOSIS — C3490 Malignant neoplasm of unspecified part of unspecified bronchus or lung: Secondary | ICD-10-CM

## 2015-02-27 NOTE — Telephone Encounter (Signed)
Called pt' home#,spoke with pt's grandson as pt was asleep.  Left message for pt PAC will be ordered and to expect a call regarding appt date/time. Called pt's daughter [name], discussed she will receive a call from scheduling regarding PAC placement.  No further concerns.

## 2015-02-27 NOTE — Therapy (Addendum)
[facility] [address] [place], [place], [address] Phone: [phone]   Fax:  [phone]  Patient Details  Name: [name] MRN: [id number] Date of Birth: 04/03/1934 Referring Provider:  Memon, Jehanzeb, MD  Encounter Date: 08/08/2014  PHYSICAL THERAPY DISCHARGE SUMMARY  Visits from Start of Care: 1  Current functional level related to goals / functional outcomes: Patient has not returned since last appointment.    Remaining deficits: Unable to assess    Education / Equipment: N/A  Plan: Patient agrees to discharge.  Patient goals were not met. Patient is being discharged due to not returning since the last visit.  ?????       Kristen Unger PT, DPT [phone]  [facility] [address] [place], [place], [address] Phone: [phone]   Fax:  [phone] 

## 2015-02-27 NOTE — Telephone Encounter (Signed)
-----   Message from Curt Bears, MD sent at 02/25/2015 12:07 PM EDT ----- Regarding: RE: PAC Ok to order [employer] ----- Message -----    From: Lucile Crater, RN    Sent: 02/23/2015   6:16 PM      To: Lucile Crater, RN, Curt Bears, MD, # Subject: [facility]                                            Dr. Julien Nordmann,  Pt was stuck 4 times today for IV access, she ( daughter ) asked if [facility] was a possibility. Also mentioned she only had 4 more treatments so wasn't sure if PAC was an option.  Please advise, Thanks  Stanton Kidney

## 2015-03-02 ENCOUNTER — Encounter: Payer: Self-pay | Admitting: *Deleted

## 2015-03-02 NOTE — Progress Notes (Signed)
Pt to hold Lovenox x1 dose prior to [facility] placement.

## 2015-03-03 ENCOUNTER — Other Ambulatory Visit (HOSPITAL_BASED_OUTPATIENT_CLINIC_OR_DEPARTMENT_OTHER): Payer: Medicare Other

## 2015-03-03 ENCOUNTER — Telehealth: Payer: Self-pay | Admitting: *Deleted

## 2015-03-03 ENCOUNTER — Telehealth: Payer: Self-pay | Admitting: Internal Medicine

## 2015-03-03 DIAGNOSIS — C7951 Secondary malignant neoplasm of bone: Secondary | ICD-10-CM

## 2015-03-03 DIAGNOSIS — C3492 Malignant neoplasm of unspecified part of left bronchus or lung: Secondary | ICD-10-CM

## 2015-03-03 LAB — COMPREHENSIVE METABOLIC PANEL (CC13)
ALBUMIN: 2.1 g/dL — AB (ref 3.5–5.0)
ALK PHOS: 112 U/L (ref 40–150)
ALT: <9 U/L (ref 0–55)
ANION GAP: 14 meq/L — AB (ref 3–11)
AST: 10 U/L (ref 5–34)
BILIRUBIN TOTAL: 0.52 mg/dL (ref 0.20–1.20)
BUN: 48.3 mg/dL — ABNORMAL HIGH (ref 7.0–26.0)
CO2: 19 mEq/L — ABNORMAL LOW (ref 22–29)
Calcium: 8.2 mg/dL — ABNORMAL LOW (ref 8.4–10.4)
Chloride: 103 mEq/L (ref 98–109)
Creatinine: 2.4 mg/dL — ABNORMAL HIGH (ref 0.6–1.1)
EGFR: 18 mL/min/{1.73_m2} — AB (ref >90)
Glucose: 128 mg/dl (ref 70–140)
Potassium: 3.8 mEq/L (ref 3.5–5.1)
Sodium: 136 mEq/L (ref 136–145)
TOTAL PROTEIN: 6.1 g/dL — AB (ref 6.4–8.3)

## 2015-03-03 LAB — CBC WITH DIFFERENTIAL/PLATELET
BASO%: 1.1 % (ref 0.0–2.0)
Basophils Absolute: 0 10*3/uL (ref 0.0–0.1)
EOS ABS: 0 10*3/uL (ref 0.0–0.5)
EOS%: 3.3 % (ref 0.0–7.0)
HCT: 30.3 % — ABNORMAL LOW (ref 34.8–46.6)
HGB: 9.9 g/dL — ABNORMAL LOW (ref 11.6–15.9)
LYMPH%: 23.1 % (ref 14.0–49.7)
MCH: 26.8 pg (ref 25.1–34.0)
MCHC: 32.7 g/dL (ref 31.5–36.0)
MCV: 82.1 fL (ref 79.5–101.0)
MONO#: 0.2 10*3/uL (ref 0.1–0.9)
MONO%: 24.2 % — AB (ref 0.0–14.0)
NEUT#: 0.4 10*3/uL — CL (ref 1.5–6.5)
NEUT%: 48.3 % (ref 38.4–76.8)
PLATELETS: 90 10*3/uL — AB (ref 145–400)
RBC: 3.69 10*6/uL — AB (ref 3.70–5.45)
RDW: 15.8 % — ABNORMAL HIGH (ref 11.2–14.5)
WBC: 0.9 10*3/uL — CL (ref 3.9–10.3)
lymph#: 0.2 10*3/uL — ABNORMAL LOW (ref 0.9–3.3)

## 2015-03-03 LAB — HOLD TUBE, BLOOD BANK

## 2015-03-03 NOTE — Telephone Encounter (Signed)
lvm for AP to sched Lab will contact pt to advised on appts

## 2015-03-03 NOTE — Progress Notes (Signed)
Quick Note:  Call patient with the result and provide neutropenic precautions and cipro 250 mg po bid X 5 days. ______

## 2015-03-03 NOTE — Telephone Encounter (Signed)
-----   Message from Curt Bears, MD sent at 03/03/2015  9:29 AM EDT ----- Call patient with the result and provide neutropenic precautions and cipro 250 mg po bid X 5 days.

## 2015-03-03 NOTE — Telephone Encounter (Signed)
s.w. pt dtr and confirm AP appt...ok and aware

## 2015-03-03 NOTE — Telephone Encounter (Signed)
Reviewed instructions below with MD, pt has allergy to Cipro. Per MD- neutropenic precauctions, instruct pt to go to ED if fever.  Spoke to pt and [name] ( daughter ) in waiting room, discussed neutropenic precautions, instructed her to take pt to ED if fever 100.5 or greater, encouraged fluids, boost or ensure for protein intake if unable to eat, mask for pt provided. Sondra verbalized understanding, request for lab only appts be done in [place] at [facility] if possible as pt tires easily and it is closer to house. POF to scheduling.

## 2015-03-06 ENCOUNTER — Other Ambulatory Visit: Payer: Self-pay | Admitting: Radiology

## 2015-03-06 ENCOUNTER — Other Ambulatory Visit: Payer: Self-pay | Admitting: Medical Oncology

## 2015-03-06 ENCOUNTER — Ambulatory Visit: Payer: Medicare Other | Admitting: Internal Medicine

## 2015-03-07 ENCOUNTER — Encounter (HOSPITAL_COMMUNITY): Payer: Self-pay

## 2015-03-07 ENCOUNTER — Encounter (HOSPITAL_BASED_OUTPATIENT_CLINIC_OR_DEPARTMENT_OTHER): Payer: Medicare Other | Attending: General Surgery

## 2015-03-07 ENCOUNTER — Other Ambulatory Visit: Payer: Self-pay | Admitting: Internal Medicine

## 2015-03-07 ENCOUNTER — Telehealth: Payer: Self-pay | Admitting: Internal Medicine

## 2015-03-07 ENCOUNTER — Ambulatory Visit (HOSPITAL_COMMUNITY)
Admission: RE | Admit: 2015-03-07 | Discharge: 2015-03-07 | Disposition: A | Discharge status: Home / Self Care | Payer: Medicare Other | Source: Ambulatory Visit | Attending: Internal Medicine | Admitting: Internal Medicine

## 2015-03-07 DIAGNOSIS — C3490 Malignant neoplasm of unspecified part of unspecified bronchus or lung: Secondary | ICD-10-CM | POA: Diagnosis not present

## 2015-03-07 DIAGNOSIS — L89154 Pressure ulcer of sacral region, stage 4: Secondary | ICD-10-CM | POA: Insufficient documentation

## 2015-03-07 DIAGNOSIS — Z85118 Personal history of other malignant neoplasm of bronchus and lung: Secondary | ICD-10-CM | POA: Diagnosis not present

## 2015-03-07 DIAGNOSIS — N186 End stage renal disease: Secondary | ICD-10-CM | POA: Insufficient documentation

## 2015-03-07 DIAGNOSIS — Z923 Personal history of irradiation: Secondary | ICD-10-CM | POA: Diagnosis not present

## 2015-03-07 DIAGNOSIS — L89510 Pressure ulcer of right ankle, unstageable: Secondary | ICD-10-CM | POA: Insufficient documentation

## 2015-03-07 DIAGNOSIS — M109 Gout, unspecified: Secondary | ICD-10-CM | POA: Diagnosis not present

## 2015-03-07 DIAGNOSIS — I2510 Atherosclerotic heart disease of native coronary artery without angina pectoris: Secondary | ICD-10-CM | POA: Insufficient documentation

## 2015-03-07 DIAGNOSIS — L7611 Accidental puncture and laceration of skin and subcutaneous tissue during a dermatologic procedure: Secondary | ICD-10-CM | POA: Diagnosis not present

## 2015-03-07 DIAGNOSIS — G629 Polyneuropathy, unspecified: Secondary | ICD-10-CM | POA: Insufficient documentation

## 2015-03-07 DIAGNOSIS — I739 Peripheral vascular disease, unspecified: Secondary | ICD-10-CM | POA: Insufficient documentation

## 2015-03-07 DIAGNOSIS — Y838 Other surgical procedures as the cause of abnormal reaction of the patient, or of later complication, without mention of misadventure at the time of the procedure: Secondary | ICD-10-CM | POA: Diagnosis not present

## 2015-03-07 DIAGNOSIS — Z86718 Personal history of other venous thrombosis and embolism: Secondary | ICD-10-CM | POA: Insufficient documentation

## 2015-03-07 DIAGNOSIS — Y813 Surgical instruments, materials and general- and plastic-surgery devices (including sutures) associated with adverse incidents: Secondary | ICD-10-CM | POA: Diagnosis not present

## 2015-03-07 DIAGNOSIS — I120 Hypertensive chronic kidney disease with stage 5 chronic kidney disease or end stage renal disease: Secondary | ICD-10-CM | POA: Insufficient documentation

## 2015-03-07 LAB — CBC WITH DIFFERENTIAL/PLATELET
BASOS ABS: 0 10*3/uL (ref 0.0–0.1)
Basophils Relative: 0 %
EOS ABS: 0 10*3/uL (ref 0.0–0.7)
Eosinophils Relative: 0 %
HEMATOCRIT: 32.1 % — AB (ref 36.0–46.0)
Hemoglobin: 10.4 g/dL — ABNORMAL LOW (ref 12.0–15.0)
LYMPHS ABS: 0.4 10*3/uL — AB (ref 0.7–4.0)
Lymphocytes Relative: 6 %
MCH: 26.9 pg (ref 26.0–34.0)
MCHC: 32.4 g/dL (ref 30.0–36.0)
MCV: 83.2 fL (ref 78.0–100.0)
MONO ABS: 0.7 10*3/uL (ref 0.1–1.0)
Monocytes Relative: 12 %
NEUTROS ABS: 5 10*3/uL (ref 1.7–7.7)
Neutrophils Relative %: 82 %
PLATELETS: 53 10*3/uL — AB (ref 150–400)
RBC: 3.86 MIL/uL — ABNORMAL LOW (ref 3.87–5.11)
RDW: 15.7 % — AB (ref 11.5–15.5)
WBC: 6.1 10*3/uL (ref 4.0–10.5)

## 2015-03-07 LAB — PROTIME-INR
INR: 0.99 (ref 0.00–1.49)
PROTHROMBIN TIME: 13.3 s (ref 11.6–15.2)

## 2015-03-07 LAB — BASIC METABOLIC PANEL
Anion gap: 14 (ref 5–15)
BUN: 59 mg/dL — AB (ref 6–20)
CALCIUM: 7.9 mg/dL — AB (ref 8.9–10.3)
CO2: 18 mmol/L — ABNORMAL LOW (ref 22–32)
CREATININE: 3.3 mg/dL — AB (ref 0.44–1.00)
Chloride: 101 mmol/L (ref 101–111)
GFR calc Af Amer: 14 mL/min — ABNORMAL LOW (ref >60)
GFR, EST NON AFRICAN AMERICAN: 12 mL/min — AB (ref >60)
GLUCOSE: 90 mg/dL (ref 65–99)
POTASSIUM: 3.9 mmol/L (ref 3.5–5.1)
SODIUM: 133 mmol/L — AB (ref 135–145)

## 2015-03-07 MED ORDER — SODIUM CHLORIDE 0.9 % IV SOLN
INTRAVENOUS | Status: DC
  Administered 2015-03-07: 14:00:00 via INTRAVENOUS

## 2015-03-07 MED ORDER — VANCOMYCIN HCL IN DEXTROSE 1-5 GM/200ML-% IV SOLN
1000.0000 mg | INTRAVENOUS | Status: AC
  Administered 2015-03-07: 1000 mg via INTRAVENOUS
  Filled 2015-03-07: qty 200

## 2015-03-07 MED ORDER — FENTANYL CITRATE (PF) 100 MCG/2ML IJ SOLN
INTRAMUSCULAR | Status: AC | PRN
  Administered 2015-03-07: 25 ug via INTRAVENOUS

## 2015-03-07 MED ORDER — MIDAZOLAM HCL 2 MG/2ML IJ SOLN
INTRAMUSCULAR | Status: AC | PRN
  Administered 2015-03-07: 0.5 mg via INTRAVENOUS

## 2015-03-07 MED ORDER — HEPARIN SOD (PORK) LOCK FLUSH 100 UNIT/ML IV SOLN
INTRAVENOUS | Status: DC
Start: 2015-03-07 — End: 2015-03-08
  Filled 2015-03-07: qty 5

## 2015-03-07 MED ORDER — FENTANYL CITRATE (PF) 100 MCG/2ML IJ SOLN
INTRAMUSCULAR | Status: DC
Start: 2015-03-07 — End: 2015-03-08
  Filled 2015-03-07: qty 2

## 2015-03-07 MED ORDER — HEPARIN SOD (PORK) LOCK FLUSH 100 UNIT/ML IV SOLN
INTRAVENOUS | Status: AC | PRN
  Administered 2015-03-07: 500 [IU]

## 2015-03-07 MED ORDER — LIDOCAINE-EPINEPHRINE 2 %-1:100000 IJ SOLN
INTRAMUSCULAR | Status: AC
  Filled 2015-03-07: qty 1

## 2015-03-07 MED ORDER — MIDAZOLAM HCL 2 MG/2ML IJ SOLN
INTRAMUSCULAR | Status: AC
  Filled 2015-03-07: qty 2

## 2015-03-07 NOTE — Procedures (Signed)
Successful placement of right IJ approach port-a-cath with tip at the superior caval atrial junction. The catheter is ready for immediate use. No immediate post procedural complications.  Ronny Bacon, MD Pager #: [phone]

## 2015-03-07 NOTE — H&P (Signed)
HPI: Patient with metastatic squamous cell carcinoma. She has been seen by Dr. Julien Nordmann on 02/23/15 and scheduled today for image guided port a catheter placement for ongoing chemotherapy. She denies any recent fever or chills.   The patient has had a H&P performed within the last 30 days, all history, medications, and exam have been reviewed. The patient denies any interval changes since the H&P.  Medications: Prior to Admission medications   Medication Sig Start Date End Date Taking? Authorizing Provider  cholestyramine light (PREVALITE) 4 G packet Take 1 packet (4 g total) by mouth 4 (four) times daily. 07/14/92   [name], MD  cloNIDine (CATAPRES) 0.2 MG tablet Take 1 tablet (0.2 mg total) by mouth 3 (three) times daily. 11/29/14   Shanker Kristeen Mans, MD  diphenoxylate-atropine (LOMOTIL) 2.5-0.025 MG per tablet Take 1 tablet by mouth 4 (four) times daily as needed for diarrhea or loose stools. 02/11/15   Wyatt Portela, MD  enoxaparin (LOVENOX) 80 MG/0.8ML injection Inject 0.8 mLs (80 mg total) into the skin daily. 11/29/14   Shanker Kristeen Mans, MD  furosemide (LASIX) 40 MG tablet TAKE ONE TABLET BY MOUTH DAILY. 02/17/15   Sherren Mocha, MD  HYDROcodone-acetaminophen (NORCO/VICODIN) 5-325 MG per tablet Take 1 tablet by mouth every 6 (six) hours as needed for moderate pain. Max APAP 3gm/24 hrs from all sources 11/30/14   Estill Dooms, MD  loperamide (IMODIUM A-D) 2 MG tablet Take 2 mg by mouth 4 (four) times daily as needed for diarrhea or loose stools (diarrhea).    Historical Provider, MD  LORazepam (ATIVAN) 1 MG tablet Take one tablet by mouth every 8 hours for anxiety Patient not taking: Reported on 02/12/2015 11/30/14   Gildardo Cranker, DO  metoprolol succinate (TOPROL-XL) 25 MG 24 hr tablet Take 1 tablet (25 mg total) by mouth daily. 11/29/14   Shanker Kristeen Mans, MD  pantoprazole (PROTONIX) 40 MG tablet TAKE ONE TABLET BY MOUTH ONCE DAILY. 02/07/15   Sherren Mocha, MD  polyethylene glycol  [facility] / Floria Raveling) packet Take 17 g by mouth daily. Patient not taking: Reported on 02/23/2015 11/29/14   Jonetta Osgood, MD  potassium chloride SA (K-DUR,KLOR-CON) 20 MEQ tablet Take 1 tablet (20 mEq total) by mouth daily. 02/10/15   Curt Bears, MD  prochlorperazine (COMPAZINE) 10 MG tablet Take 1 tablet (10 mg total) by mouth every 6 (six) hours as needed for nausea or vomiting. Patient not taking: Reported on 02/23/2015 01/25/15   Curt Bears, MD  SANTYL ointment Apply 1 application topically at bedtime as needed. For bed sores 12/27/14   Historical Provider, MD  venlafaxine (EFFEXOR) 37.5 MG tablet Take 1 tablet (37.5 mg total) by mouth 2 (two) times daily. Patient not taking: Reported on 02/12/2015 01/11/15   Curt Bears, MD    Vital Signs: BP 94/42 mmHg  Pulse 87  Temp(Src) 97.3 F (36.3 C) (Oral)  Resp 16  SpO2 98%  Physical Exam  Constitutional: She is oriented to person, place, and time. No distress.  HENT:  Head: Normocephalic and atraumatic.  Cardiovascular: Normal rate and regular rhythm.  Exam reveals no gallop and no friction rub.   No murmur heard. Pulmonary/Chest: Effort normal and breath sounds normal. No respiratory distress. She has no wheezes. She has no rales.  Abdominal: Soft. Bowel sounds are normal. She exhibits no distension. There is no tenderness.  Neurological: She is alert and oriented to person, place, and time.  Skin: She is not diaphoretic.  Mallampati Score:  MD Evaluation Airway: WNL Heart: WNL Abdomen: WNL Chest/ Lungs: WNL ASA  Classification: 3 Mallampati/Airway Score: Two  Labs:  CBC:  Recent Labs  02/17/15 0903 02/23/15 0917 03/03/15 0847 03/07/15 1400  WBC 14.2* 8.6 0.9* 6.1  HGB 11.1* 9.3* 9.9* 10.4*  HCT 34.8 28.5* 30.3* 32.1*  PLT 130* 289 90* 53*    COAGS:  Recent Labs  11/23/14 0730 11/24/14 0700 11/26/14 0137 01/03/15 0706 02/16/15 1226 03/07/15 1400  INR 1.22 1.35 1.25 1.31 1.10 0.99  APTT 36  35  --  48* 44*  --     BMP:  Recent Labs  01/11/15 1401  02/12/15 0009 02/16/15 1226 02/17/15 0903 02/23/15 0917 03/03/15 0847 03/07/15 1400  NA 135  < > 133* 132* 136 135* 136 133*  K 3.5  < > 3.5 4.9 4.2 4.4 3.8 3.9  CL 101  --  102 103  --   --   --  101  CO2 23  < > 20* 21* 19* 22 19* 18*  GLUCOSE 118*  < > 125* 106* 109 101 128 90  BUN 62*  < > 55* 47* 47.5* 45.8* 48.3* 59*  CALCIUM 8.2*  < > 8.2* 7.4* 8.4 7.9* 8.2* 7.9*  CREATININE 3.13*  < > 3.19* 2.93* 2.9* 2.3* 2.4* 3.30*  GFRNONAA 13*  --  13* 14*  --   --   --  12*  GFRAA 15*  --  15* 16*  --   --   --  14*  < > = values in this interval not displayed.  LIVER FUNCTION TESTS:  Recent Labs  02/12/15 0009 02/17/15 0903 02/23/15 0917 03/03/15 0847  BILITOT 0.5 0.42 0.33 0.52  AST 16 '7 7 10  '$ ALT 8* <6 <9 <9  ALKPHOS 106 165* 133 112  PROT 6.5 6.1* 5.7* 6.1*  ALBUMIN 2.4* 1.9* 1.7* 2.1*    Assessment/Plan:  Metastatic squamous cell carcinoma Seen by Dr. Julien Nordmann 02/23/15 Scheduled today for image guided port a catheter placement with sedation for chemotherapy  The patient has been NPO, no blood thinners taken, labs and vitals have been reviewed. Risks and Benefits discussed with the patient including, but not limited to bleeding, infection, pneumothorax, or fibrin sheath development and need for additional procedures. All of the patient's questions were answered, patient is agreeable to proceed. Consent signed and in chart.   SignedHedy Jacob 03/07/2015, 3:06 PM

## 2015-03-07 NOTE — Discharge Instructions (Signed)

## 2015-03-07 NOTE — Telephone Encounter (Signed)
Pt's daughter came in to r/s labs, can't get anyone to help her at [facility] wants to come get labs here. Advise to call [facility] to cancel all labs.Marland KitchenMarland KitchenMarland Kitchen

## 2015-03-09 ENCOUNTER — Other Ambulatory Visit: Payer: Medicare Other

## 2015-03-09 ENCOUNTER — Ambulatory Visit (HOSPITAL_BASED_OUTPATIENT_CLINIC_OR_DEPARTMENT_OTHER): Payer: Medicare Other

## 2015-03-09 ENCOUNTER — Other Ambulatory Visit (HOSPITAL_COMMUNITY): Payer: Medicare Other

## 2015-03-09 ENCOUNTER — Other Ambulatory Visit (HOSPITAL_BASED_OUTPATIENT_CLINIC_OR_DEPARTMENT_OTHER): Payer: Medicare Other

## 2015-03-09 DIAGNOSIS — C7951 Secondary malignant neoplasm of bone: Secondary | ICD-10-CM | POA: Diagnosis not present

## 2015-03-09 DIAGNOSIS — C3492 Malignant neoplasm of unspecified part of left bronchus or lung: Secondary | ICD-10-CM

## 2015-03-09 DIAGNOSIS — Z95828 Presence of other vascular implants and grafts: Secondary | ICD-10-CM

## 2015-03-09 LAB — CBC WITH DIFFERENTIAL/PLATELET
BASO%: 0.4 % (ref 0.0–2.0)
Basophils Absolute: 0 10*3/uL (ref 0.0–0.1)
EOS%: 0.1 % (ref 0.0–7.0)
Eosinophils Absolute: 0 10*3/uL (ref 0.0–0.5)
HCT: 30.7 % — ABNORMAL LOW (ref 34.8–46.6)
HEMOGLOBIN: 10.3 g/dL — AB (ref 11.6–15.9)
LYMPH#: 0.4 10*3/uL — AB (ref 0.9–3.3)
LYMPH%: 5.2 % — ABNORMAL LOW (ref 14.0–49.7)
MCH: 26.8 pg (ref 25.1–34.0)
MCHC: 33.6 g/dL (ref 31.5–36.0)
MCV: 79.7 fL (ref 79.5–101.0)
MONO#: 0.7 10*3/uL (ref 0.1–0.9)
MONO%: 8.6 % (ref 0.0–14.0)
NEUT%: 85.7 % — AB (ref 38.4–76.8)
NEUTROS ABS: 6.9 10*3/uL — AB (ref 1.5–6.5)
Platelets: 54 10*3/uL — ABNORMAL LOW (ref 145–400)
RBC: 3.85 10*6/uL (ref 3.70–5.45)
RDW: 15.6 % — ABNORMAL HIGH (ref 11.2–14.5)
WBC: 8 10*3/uL (ref 3.9–10.3)
nRBC: 0 % (ref 0–0)

## 2015-03-09 LAB — COMPREHENSIVE METABOLIC PANEL (CC13)
ALBUMIN: 2 g/dL — AB (ref 3.5–5.0)
ALT: <9 U/L (ref 0–55)
AST: 10 U/L (ref 5–34)
Alkaline Phosphatase: 121 U/L (ref 40–150)
Anion Gap: 13 mEq/L — ABNORMAL HIGH (ref 3–11)
BUN: 58.7 mg/dL — AB (ref 7.0–26.0)
CHLORIDE: 100 meq/L (ref 98–109)
CO2: 19 mEq/L — ABNORMAL LOW (ref 22–29)
Calcium: 8.1 mg/dL — ABNORMAL LOW (ref 8.4–10.4)
Creatinine: 3.6 mg/dL (ref 0.6–1.1)
EGFR: 11 mL/min/{1.73_m2} — ABNORMAL LOW (ref >90)
GLUCOSE: 98 mg/dL (ref 70–140)
POTASSIUM: 3.4 meq/L — AB (ref 3.5–5.1)
SODIUM: 133 meq/L — AB (ref 136–145)
Total Bilirubin: 0.41 mg/dL (ref 0.20–1.20)
Total Protein: 5.9 g/dL — ABNORMAL LOW (ref 6.4–8.3)

## 2015-03-09 MED ORDER — HEPARIN SOD (PORK) LOCK FLUSH 100 UNIT/ML IV SOLN
500.0000 [IU] | Freq: Once | INTRAVENOUS | Status: AC
Start: 2015-03-09 — End: 2015-03-09
  Administered 2015-03-09: 500 [IU] via INTRAVENOUS
  Filled 2015-03-09: qty 5

## 2015-03-09 MED ORDER — SODIUM CHLORIDE 0.9 % IJ SOLN
10.0000 mL | INTRAMUSCULAR | Status: DC | PRN
  Administered 2015-03-09: 10 mL via INTRAVENOUS
  Filled 2015-03-09: qty 10

## 2015-03-09 NOTE — Patient Instructions (Signed)

## 2015-03-10 ENCOUNTER — Other Ambulatory Visit (HOSPITAL_COMMUNITY): Payer: Medicare Other

## 2015-03-15 ENCOUNTER — Other Ambulatory Visit: Payer: Self-pay | Admitting: Cardiovascular Disease

## 2015-03-16 ENCOUNTER — Other Ambulatory Visit: Payer: Self-pay | Admitting: Internal Medicine

## 2015-03-17 ENCOUNTER — Ambulatory Visit: Payer: Medicare Other

## 2015-03-17 ENCOUNTER — Encounter (HOSPITAL_COMMUNITY): Payer: Self-pay | Admitting: *Deleted

## 2015-03-17 ENCOUNTER — Telehealth: Payer: Self-pay | Admitting: Internal Medicine

## 2015-03-17 ENCOUNTER — Observation Stay (HOSPITAL_COMMUNITY): Payer: Medicare Other

## 2015-03-17 ENCOUNTER — Other Ambulatory Visit: Payer: Self-pay | Admitting: Nurse Practitioner

## 2015-03-17 ENCOUNTER — Observation Stay (HOSPITAL_COMMUNITY)
Admission: AD | Admit: 2015-03-17 | Discharge: 2015-03-20 | Disposition: A | Discharge status: Home / Self Care | Payer: Medicare Other | Source: Ambulatory Visit | Attending: Internal Medicine | Admitting: Internal Medicine

## 2015-03-17 ENCOUNTER — Other Ambulatory Visit: Payer: Self-pay | Admitting: Neurology

## 2015-03-17 ENCOUNTER — Ambulatory Visit (HOSPITAL_BASED_OUTPATIENT_CLINIC_OR_DEPARTMENT_OTHER): Payer: Medicare Other

## 2015-03-17 ENCOUNTER — Ambulatory Visit (HOSPITAL_BASED_OUTPATIENT_CLINIC_OR_DEPARTMENT_OTHER): Payer: Medicare Other | Admitting: Nurse Practitioner

## 2015-03-17 ENCOUNTER — Other Ambulatory Visit (HOSPITAL_BASED_OUTPATIENT_CLINIC_OR_DEPARTMENT_OTHER): Payer: Medicare Other

## 2015-03-17 VITALS — BP 80/53 | HR 91 | Temp 97.7°F | Resp 17 | Ht 68.0 in

## 2015-03-17 DIAGNOSIS — C3490 Malignant neoplasm of unspecified part of unspecified bronchus or lung: Secondary | ICD-10-CM | POA: Insufficient documentation

## 2015-03-17 DIAGNOSIS — I255 Ischemic cardiomyopathy: Secondary | ICD-10-CM | POA: Insufficient documentation

## 2015-03-17 DIAGNOSIS — Z87891 Personal history of nicotine dependence: Secondary | ICD-10-CM | POA: Diagnosis not present

## 2015-03-17 DIAGNOSIS — C7951 Secondary malignant neoplasm of bone: Secondary | ICD-10-CM

## 2015-03-17 DIAGNOSIS — E7800 Pure hypercholesterolemia, unspecified: Secondary | ICD-10-CM | POA: Diagnosis not present

## 2015-03-17 DIAGNOSIS — N179 Acute kidney failure, unspecified: Secondary | ICD-10-CM | POA: Diagnosis not present

## 2015-03-17 DIAGNOSIS — R52 Pain, unspecified: Secondary | ICD-10-CM

## 2015-03-17 DIAGNOSIS — D638 Anemia in other chronic diseases classified elsewhere: Secondary | ICD-10-CM | POA: Diagnosis not present

## 2015-03-17 DIAGNOSIS — Z79891 Long term (current) use of opiate analgesic: Secondary | ICD-10-CM | POA: Diagnosis not present

## 2015-03-17 DIAGNOSIS — L89609 Pressure ulcer of unspecified heel, unspecified stage: Secondary | ICD-10-CM | POA: Diagnosis not present

## 2015-03-17 DIAGNOSIS — M25532 Pain in left wrist: Secondary | ICD-10-CM | POA: Diagnosis not present

## 2015-03-17 DIAGNOSIS — Z681 Body mass index (BMI) 19 or less, adult: Secondary | ICD-10-CM | POA: Insufficient documentation

## 2015-03-17 DIAGNOSIS — L8990 Pressure ulcer of unspecified site, unspecified stage: Secondary | ICD-10-CM | POA: Diagnosis present

## 2015-03-17 DIAGNOSIS — Z7901 Long term (current) use of anticoagulants: Secondary | ICD-10-CM | POA: Diagnosis not present

## 2015-03-17 DIAGNOSIS — L89159 Pressure ulcer of sacral region, unspecified stage: Secondary | ICD-10-CM | POA: Insufficient documentation

## 2015-03-17 DIAGNOSIS — N183 Chronic kidney disease, stage 3 unspecified: Secondary | ICD-10-CM | POA: Diagnosis present

## 2015-03-17 DIAGNOSIS — I950 Idiopathic hypotension: Secondary | ICD-10-CM | POA: Diagnosis not present

## 2015-03-17 DIAGNOSIS — C3400 Malignant neoplasm of unspecified main bronchus: Secondary | ICD-10-CM

## 2015-03-17 DIAGNOSIS — Z79899 Other long term (current) drug therapy: Secondary | ICD-10-CM | POA: Diagnosis not present

## 2015-03-17 DIAGNOSIS — C3492 Malignant neoplasm of unspecified part of left bronchus or lung: Secondary | ICD-10-CM

## 2015-03-17 DIAGNOSIS — E43 Unspecified severe protein-calorie malnutrition: Secondary | ICD-10-CM | POA: Diagnosis not present

## 2015-03-17 DIAGNOSIS — R627 Adult failure to thrive: Secondary | ICD-10-CM | POA: Insufficient documentation

## 2015-03-17 DIAGNOSIS — I2510 Atherosclerotic heart disease of native coronary artery without angina pectoris: Secondary | ICD-10-CM | POA: Insufficient documentation

## 2015-03-17 DIAGNOSIS — K219 Gastro-esophageal reflux disease without esophagitis: Secondary | ICD-10-CM | POA: Diagnosis not present

## 2015-03-17 DIAGNOSIS — I129 Hypertensive chronic kidney disease with stage 1 through stage 4 chronic kidney disease, or unspecified chronic kidney disease: Secondary | ICD-10-CM | POA: Diagnosis not present

## 2015-03-17 DIAGNOSIS — I959 Hypotension, unspecified: Secondary | ICD-10-CM

## 2015-03-17 DIAGNOSIS — I951 Orthostatic hypotension: Secondary | ICD-10-CM | POA: Diagnosis not present

## 2015-03-17 DIAGNOSIS — E876 Hypokalemia: Secondary | ICD-10-CM | POA: Diagnosis present

## 2015-03-17 DIAGNOSIS — E871 Hypo-osmolality and hyponatremia: Secondary | ICD-10-CM | POA: Diagnosis not present

## 2015-03-17 DIAGNOSIS — E860 Dehydration: Secondary | ICD-10-CM

## 2015-03-17 DIAGNOSIS — R0600 Dyspnea, unspecified: Secondary | ICD-10-CM

## 2015-03-17 LAB — COMPREHENSIVE METABOLIC PANEL (CC13)
ALBUMIN: 1.9 g/dL — AB (ref 3.5–5.0)
ALK PHOS: 127 U/L (ref 40–150)
ALT: <9 U/L (ref 0–55)
ANION GAP: 14 meq/L — AB (ref 3–11)
AST: 7 U/L (ref 5–34)
BILIRUBIN TOTAL: 0.31 mg/dL (ref 0.20–1.20)
BUN: 67 mg/dL — ABNORMAL HIGH (ref 7.0–26.0)
CO2: 22 mEq/L (ref 22–29)
Calcium: 8.3 mg/dL — ABNORMAL LOW (ref 8.4–10.4)
Chloride: 100 mEq/L (ref 98–109)
Creatinine: 3.5 mg/dL (ref 0.6–1.1)
EGFR: 12 mL/min/{1.73_m2} — AB (ref >90)
GLUCOSE: 156 mg/dL — AB (ref 70–140)
POTASSIUM: 3.2 meq/L — AB (ref 3.5–5.1)
SODIUM: 136 meq/L (ref 136–145)
Total Protein: 6.1 g/dL — ABNORMAL LOW (ref 6.4–8.3)

## 2015-03-17 LAB — COMPREHENSIVE METABOLIC PANEL
ALBUMIN: 2 g/dL — AB (ref 3.5–5.0)
ALT: 6 U/L — AB (ref 14–54)
AST: 11 U/L — AB (ref 15–41)
Alkaline Phosphatase: 100 U/L (ref 38–126)
Anion gap: 9 (ref 5–15)
BUN: 66 mg/dL — AB (ref 6–20)
CHLORIDE: 103 mmol/L (ref 101–111)
CO2: 24 mmol/L (ref 22–32)
CREATININE: 3.25 mg/dL — AB (ref 0.44–1.00)
Calcium: 7.7 mg/dL — ABNORMAL LOW (ref 8.9–10.3)
GFR calc Af Amer: 15 mL/min — ABNORMAL LOW (ref >60)
GFR calc non Af Amer: 13 mL/min — ABNORMAL LOW (ref >60)
Glucose, Bld: 109 mg/dL — ABNORMAL HIGH (ref 65–99)
Potassium: 3.3 mmol/L — ABNORMAL LOW (ref 3.5–5.1)
SODIUM: 136 mmol/L (ref 135–145)
Total Bilirubin: 0.5 mg/dL (ref 0.3–1.2)
Total Protein: 5.6 g/dL — ABNORMAL LOW (ref 6.5–8.1)

## 2015-03-17 LAB — CBC WITH DIFFERENTIAL/PLATELET
BASO%: 1.1 % (ref 0.0–2.0)
BASOS ABS: 0.1 10*3/uL (ref 0.0–0.1)
EOS ABS: 0 10*3/uL (ref 0.0–0.5)
EOS%: 0.1 % (ref 0.0–7.0)
HCT: 26.1 % — ABNORMAL LOW (ref 34.8–46.6)
HEMOGLOBIN: 8.5 g/dL — AB (ref 11.6–15.9)
LYMPH%: 7.8 % — AB (ref 14.0–49.7)
MCH: 26.5 pg (ref 25.1–34.0)
MCHC: 32.6 g/dL (ref 31.5–36.0)
MCV: 81.4 fL (ref 79.5–101.0)
MONO#: 0.7 10*3/uL (ref 0.1–0.9)
MONO%: 9.1 % (ref 0.0–14.0)
NEUT#: 5.9 10*3/uL (ref 1.5–6.5)
NEUT%: 81.9 % — ABNORMAL HIGH (ref 38.4–76.8)
PLATELETS: 304 10*3/uL (ref 145–400)
RBC: 3.21 10*6/uL — AB (ref 3.70–5.45)
RDW: 16.4 % — ABNORMAL HIGH (ref 11.2–14.5)
WBC: 7.2 10*3/uL (ref 3.9–10.3)
lymph#: 0.6 10*3/uL — ABNORMAL LOW (ref 0.9–3.3)

## 2015-03-17 LAB — MAGNESIUM: Magnesium: 1.5 mg/dL — ABNORMAL LOW (ref 1.7–2.4)

## 2015-03-17 LAB — CBC
HCT: 23.9 % — ABNORMAL LOW (ref 36.0–46.0)
Hemoglobin: 7.8 g/dL — ABNORMAL LOW (ref 12.0–15.0)
MCH: 27 pg (ref 26.0–34.0)
MCHC: 32.6 g/dL (ref 30.0–36.0)
MCV: 82.7 fL (ref 78.0–100.0)
PLATELETS: 240 10*3/uL (ref 150–400)
RBC: 2.89 MIL/uL — AB (ref 3.87–5.11)
RDW: 16 % — ABNORMAL HIGH (ref 11.5–15.5)
WBC: 6.2 10*3/uL (ref 4.0–10.5)

## 2015-03-17 LAB — MRSA PCR SCREENING: MRSA BY PCR: NEGATIVE

## 2015-03-17 LAB — PHOSPHORUS: PHOSPHORUS: 3.7 mg/dL (ref 2.5–4.6)

## 2015-03-17 MED ORDER — ENSURE ENLIVE PO LIQD: 237.0000 mL | Freq: Two times a day (BID) | ORAL | Status: DC

## 2015-03-17 MED ORDER — POTASSIUM CHLORIDE CRYS ER 20 MEQ PO TBCR: 20.0000 meq | EXTENDED_RELEASE_TABLET | Freq: Every day | ORAL | Status: DC

## 2015-03-17 MED ORDER — CETYLPYRIDINIUM CHLORIDE 0.05 % MT LIQD
7.0000 mL | Freq: Two times a day (BID) | OROMUCOSAL | Status: DC
  Administered 2015-03-18 (×2): 7 mL via OROMUCOSAL

## 2015-03-17 MED ORDER — HEPARIN SODIUM (PORCINE) 5000 UNIT/ML IJ SOLN
5000.0000 [IU] | Freq: Three times a day (TID) | INTRAMUSCULAR | Status: DC
  Administered 2015-03-17 – 2015-03-18 (×2): 5000 [IU] via SUBCUTANEOUS
  Filled 2015-03-17 (×2): qty 1

## 2015-03-17 MED ORDER — POTASSIUM CHLORIDE CRYS ER 20 MEQ PO TBCR: 40.0000 meq | EXTENDED_RELEASE_TABLET | Freq: Once | ORAL | Status: DC

## 2015-03-17 MED ORDER — CHLORHEXIDINE GLUCONATE 0.12 % MT SOLN
15.0000 mL | Freq: Two times a day (BID) | OROMUCOSAL | Status: DC
  Administered 2015-03-18: 15 mL via OROMUCOSAL
  Filled 2015-03-17: qty 15

## 2015-03-17 MED ORDER — ONDANSETRON HCL 4 MG/2ML IJ SOLN: 4.0000 mg | Freq: Four times a day (QID) | INTRAMUSCULAR | Status: DC | PRN

## 2015-03-17 MED ORDER — SODIUM CHLORIDE 0.9 % IV SOLN
1000.0000 mL | Freq: Once | INTRAVENOUS | Status: AC
  Administered 2015-03-17: 1000 mL via INTRAVENOUS

## 2015-03-17 MED ORDER — ONDANSETRON HCL 4 MG PO TABS: 4.0000 mg | ORAL_TABLET | Freq: Four times a day (QID) | ORAL | Status: DC | PRN

## 2015-03-17 MED ORDER — LIDOCAINE-PRILOCAINE 2.5-2.5 % EX CREA: 1.0000 "application " | TOPICAL_CREAM | CUTANEOUS | Status: DC | PRN

## 2015-03-17 MED ORDER — METHYLPREDNISOLONE 4 MG PO TBPK: ORAL_TABLET | ORAL | Status: DC

## 2015-03-17 MED ORDER — HYDROCODONE-ACETAMINOPHEN 5-325 MG PO TABS
1.0000 | ORAL_TABLET | ORAL | Status: DC | PRN
  Administered 2015-03-18 – 2015-03-19 (×4): 1 via ORAL
  Filled 2015-03-17 (×4): qty 1

## 2015-03-17 MED ORDER — SODIUM CHLORIDE 0.9 % IV SOLN
INTRAVENOUS | Status: DC
  Administered 2015-03-17: 18:00:00 via INTRAVENOUS
  Administered 2015-03-18 – 2015-03-19 (×2): 1000 mL via INTRAVENOUS

## 2015-03-17 MED ORDER — ENOXAPARIN SODIUM 40 MG/0.4ML ~~LOC~~ SOLN: 40.0000 mg | SUBCUTANEOUS | Status: DC

## 2015-03-17 MED ORDER — SODIUM CHLORIDE 0.9 % IJ SOLN
10.0000 mL | INTRAMUSCULAR | Status: DC | PRN
Start: 2015-03-17 — End: 2015-03-17
  Administered 2015-03-17: 10 mL via INTRAVENOUS
  Filled 2015-03-17: qty 10

## 2015-03-17 NOTE — Patient Instructions (Signed)

## 2015-03-17 NOTE — Telephone Encounter (Signed)
per pof to sch pt appt-gave pt copy of avs-[facility] will call to sch scans

## 2015-03-17 NOTE — H&P (Addendum)
[facility] History and Physical  TIFINI [name] JSE:[id number] DOB: [birth year] DOA: 03/17/2015  Referring physician: from Cancer center, NP Lattie Haw  PCP: Wende Neighbors, MD   Chief Complaint: poor oral intake, FTT   HPI:  Pt is very pleasant 81 yo female with known stage IV non-small cell lung cancer diagnosed in March 2007, with disease recurrence in June 2016 with positive PDL 1 expression, follows with Dr. Julien Nordmann, status post 6 cycles of systemic chemotherapy with carboplatin and docetaxel. Last dose was given January 16, 2006 and palliative radiotherapy to large right paraspinous mass with osseous invasion centered at L5 under the care of Dr. Valere Dross. Currently on systemic chemotherapy with carboplatin for AUC of 5 and paclitaxel 175 MG/M2 every 3 weeks with Neulasta support. First dose 02/03/2015. Status post 2 cycles. Pt presented to cancer center earlier in the day with weakness and FTT. No specific symptoms such as chest pain or shortness of breath, no fevers or chills, no abd or urinary concerns. [facility] asked to admit for evaluation and hydration and her BP at the clinic was as low as 77/49. Of note pt is followed at the wound clinic for a sacral wound and right ankle wound.   Assessment and Plan:  Principal Problem:   Hypotension - likely related to poor oral intake and FTT - provide IVF, monitor in SDU - allow to advance diet as pt able to tolerate   Active Problems:   Non-small cell lung cancer [facility]) - oncologist aware of pt's admission    Severe protein-calorie malnutrition ([place]) - nutritionist consulted - continue nutritional supplements     Acute renal failure superimposed on stage 3 chronic kidney disease ([place]) - likely pre renal in etiology - provide IVF and repeat BMP In AM    Pressure ulcer - wound care tram consulted     Anemia, chronic disease, malignancy and chemotx - transfuse for Hg < 7.5 - CBC in AM    Hypokalemia - supplement and repeat BMP in  AM  DVT prophylaxis - Heparin SQ   Radiological Exams on Admission: No results found.   Code Status: Full Family Communication: Pt and daughter at bedside Disposition Plan: Admit for further evaluation    Mart Piggs [facility] [phone]   Review of Systems:  Constitutional: Negative for fever, chills. Negative for diaphoresis.  HENT: Negative for hearing loss, ear pain, nosebleeds, congestion, sore throat, neck pain, tinnitus and ear discharge.   Eyes: Negative for blurred vision, double vision, photophobia, pain, discharge and redness.  Respiratory: Negative for cough, hemoptysis, sputum production, shortness of breath, wheezing and stridor.   Cardiovascular: Negative for chest pain, palpitations, orthopnea, claudication and leg swelling.  Gastrointestinal: Negative for heartburn, constipation, blood in stool and melena.  Genitourinary: Negative for dysuria, urgency, frequency, hematuria and flank pain.  Musculoskeletal: Negative for myalgias, back pain, joint pain and falls.  Skin: Negative for itching and rash.  Neurological: Negative for dizziness, sensory change, speech change, focal weakness, loss of consciousness and headaches.  Endo/Heme/Allergies: Negative for environmental allergies and polydipsia. Does not bruise/bleed easily.  Psychiatric/Behavioral: Negative for suicidal ideas. The patient is not nervous/anxious.      Past Medical History  Diagnosis Date  . Hypertension   . Hypercholesterolemia   . CKD (chronic kidney disease)   . Coronary artery disease     a.  LHC (06/04/05): LHC done in [place] with high grade RCA => s/p BMS to RCA;  b.  Nuclear (09/14/09): Lexiscan; Inf infarct with mild peri-infarct  ishemia, EF 52%; Low Risk.  Marland Kitchen GERD (gastroesophageal reflux disease)   . Chronic anemia   . Ischemic cardiomyopathy     a. Echo (07/26/13): Mild LVH, EF 35-40%, diff HK, inf AK, Gr 2 DD, Tr AI, mildly dilated Ao root, MAC, mild MR, mild LAE, mod reduced RVSF.   . Non-small cell carcinoma of lung ([place])     Stage IV    Past Surgical History  Procedure Laterality Date  . Hematoma evacuation  December 2006    groin  . Appendectomy    . Cataract extraction    . Hemorroidectomy    . Laminectomy      Social History:  reports that she has quit smoking. Her smoking use included Cigarettes. She does not have any smokeless tobacco history on file. She reports that she does not drink alcohol. Her drug history is not on file.  Allergies  Allergen Reactions  . Amlodipine Swelling  . Ciprofloxacin Hives and Other (See Comments)    REACTION: weakness  . Mirtazapine Other (See Comments)    Hallucinations and nightmares, verbally aggressive   . Statins Other (See Comments)  . Aspirin Swelling    Mouth swelling, tongue  . Cephalexin Hives  . Hydralazine Nausea And Vomiting  . Iron Nausea And Vomiting  . Sulfonamide Derivatives Hives  . Penicillins Other (See Comments)    Has patient had a PCN reaction causing immediate rash, facial/tongue/throat swelling, SOB or lightheadedness with hypotension: Yes Has patient had a PCN reaction causing severe rash involving mucus membranes or skin necrosis: Yes Has patient had a PCN reaction that required hospitalization No Has patient had a PCN reaction occurring within the last 10 years: No If all of the above answers are "NO", then may proceed with Cephalosporin use.   . Shellfish Allergy Hives and Rash  . Tape Rash    Family History  Problem Relation Age of Onset  . Heart failure Mother   . Lung cancer Sister   . Lung cancer Brother   . Stomach cancer Brother   . Heart attack Neg Hx   . Stroke Neg Hx     Prior to Admission medications   Medication Sig Start Date End Date Taking? Authorizing Provider  cholestyramine light (PREVALITE) 4 G packet Take 1 packet (4 g total) by mouth 4 (four) times daily. Patient not taking: Reported on 03/07/2015 1/69/67   [name], MD  cloNIDine (CATAPRES) 0.2 MG  tablet Take 1 tablet (0.2 mg total) by mouth 3 (three) times daily. Patient not taking: Reported on 03/07/2015 11/29/14   Jonetta Osgood, MD  diphenoxylate-atropine (LOMOTIL) 2.5-0.025 MG per tablet Take 1 tablet by mouth 4 (four) times daily as needed for diarrhea or loose stools. 02/11/15   Wyatt Portela, MD  enoxaparin (LOVENOX) 80 MG/0.8ML injection Inject 0.8 mLs (80 mg total) into the skin daily. 11/29/14   Shanker Kristeen Mans, MD  furosemide (LASIX) 40 MG tablet TAKE ONE TABLET BY MOUTH DAILY. 03/15/15   Sherren Mocha, MD  HYDROcodone-acetaminophen (NORCO/VICODIN) 5-325 MG per tablet Take 1 tablet by mouth every 6 (six) hours as needed for moderate pain. Max APAP 3gm/24 hrs from all sources 11/30/14   Estill Dooms, MD  lidocaine-prilocaine (EMLA) cream Apply 1 application topically as needed. 03/17/15   Curt Bears, MD  loperamide (IMODIUM A-D) 2 MG tablet Take 2 mg by mouth 4 (four) times daily as needed for diarrhea or loose stools (diarrhea).    Historical Provider, MD  LORazepam (  ATIVAN) 1 MG tablet Take one tablet by mouth every 8 hours for anxiety Patient not taking: Reported on 02/12/2015 11/30/14   Gildardo Cranker, DO  methylPREDNISolone (MEDROL DOSEPAK) 4 MG TBPK tablet Take as directed 03/17/15   Owens Shark, NP  metoprolol succinate (TOPROL-XL) 25 MG 24 hr tablet Take 1 tablet (25 mg total) by mouth daily. 11/29/14   Shanker Kristeen Mans, MD  pantoprazole (PROTONIX) 40 MG tablet TAKE ONE TABLET BY MOUTH ONCE DAILY. 02/07/15   Sherren Mocha, MD  polyethylene glycol [facility] / Floria Raveling) packet Take 17 g by mouth daily. Patient not taking: Reported on 02/23/2015 11/29/14   Jonetta Osgood, MD  potassium chloride SA (K-DUR,KLOR-CON) 20 MEQ tablet Take 1 tablet (20 mEq total) by mouth daily. 03/17/15   Owens Shark, NP  prochlorperazine (COMPAZINE) 10 MG tablet TAKE (1) TABLET BY MOUTH EVERY SIX HOURS AS NEEDED FOR NAUSEA AND VOMITING. 03/16/15   Curt Bears, MD  SANTYL ointment Apply  1 application topically at bedtime as needed. For bed sores 12/27/14   Historical Provider, MD  venlafaxine (EFFEXOR) 37.5 MG tablet Take 1 tablet (37.5 mg total) by mouth 2 (two) times daily. Patient not taking: Reported on 02/12/2015 01/11/15   Curt Bears, MD    Physical Exam: Filed Vitals:   03/17/15 1600 03/17/15 1617  BP:  100/51  Pulse:  82  Resp:  11  Height: '5\' 8"'$  (1.727 m)   Weight: 49.6 kg (109 lb 5.6 oz)   SpO2:  97%    Physical Exam  Constitutional: Appears stable, NAD, frail and weak  HENT: Normocephalic. External right and left ear normal. Dry MM  Eyes: Conjunctivae and EOM are normal. PERRLA, no scleral icterus.  Neck: Normal ROM. Neck supple. No JVD. No tracheal deviation. No thyromegaly.  CVS: RRR, S1/S2 +, no gallops, no carotid bruit.  Pulmonary: Effort and breath sounds normal, no stridor, rhonchi, wheezes, rales.  Abdominal: Soft. BS +,  no distension, tenderness, rebound or guarding.  Musculoskeletal: Normal range of motion.  Lymphadenopathy: No lymphadenopathy noted, cervical, inguinal. Neuro: Alert. Normal reflexes, muscle tone coordination. No cranial nerve deficit. Skin: Skin is warm and dry. No rash noted. Not diaphoretic. No erythema. No pallor.  Psychiatric: Normal mood and affect.   Labs on Admission:  Basic Metabolic Panel:  Recent Labs Lab 03/17/15 0836  NA 136  K 3.2*  CO2 22  GLUCOSE 156*  BUN 67.0*  CREATININE 3.5*  CALCIUM 8.3*   Liver Function Tests:  Recent Labs Lab 03/17/15 0836  AST 7  ALT <9  ALKPHOS 127  BILITOT 0.31  PROT 6.1*  ALBUMIN 1.9*   CBC:  Recent Labs Lab 03/17/15 0836  WBC 7.2  NEUTROABS 5.9  HGB 8.5*  HCT 26.1*  MCV 81.4  PLT 304   EKG: pending    If 7PM-7AM, please contact night-coverage [email or web address] Password [email or web address] 03/17/2015, 4:37 PM

## 2015-03-17 NOTE — Progress Notes (Signed)
1200 BP half way through infusion of 85/37. This writer informed Ned Card NP, per NP pt to continue to receive the entire liter of fluid. Per NP pt to stop taking lasix and to start taking potassium. Prescription for potassium to be sent to pt's pharmacy. Pt and pt's daughter instructed and they both expressed understanding. Pt daughter requested prescription for emla cream. Order placed and instructed daughter on how to apply.  1350 BP still low at 87/35. Ned Card NP notified. Awaiting orders.  [place] NP came back to see pt. BP now 77/40. Decision was made to admit patient.  1545 Report given to stepdown RN. Hinda Lenis RN brought patient to stepdown/ICU.

## 2015-03-17 NOTE — Patient Instructions (Signed)

## 2015-03-17 NOTE — Progress Notes (Addendum)
[place] OFFICE PROGRESS NOTE   PRINCIPAL DIAGNOSIS: Stage IV non-small cell lung cancer diagnosed in March 2007, with disease recurrence in June 2016 with positive PDL 1 expression.   PRIOR THERAPY:  1) Status post 6 cycles of systemic chemotherapy with carboplatin and docetaxel. Last dose was given January 16, 2006.  2) Palliative radiotherapy to large right paraspinous mass with osseous invasion centered at L5 under [name] care of Dr. Valere Dross.  CURRENT THERAPY: Systemic chemotherapy with carboplatin for AUC of 5 and paclitaxel 175 MG/M2 every 3 weeks with Neulasta support. First dose 02/03/2015. Status post 2 cycles.    INTERVAL HISTORY:   [name] returns as scheduled. She completed cycle 2 carboplatin/Taxol 02/23/2015. She had nausea for several days after [name] chemotherapy. No mouth sores. She continues to have loose stools estimating 1-3 per day. Her appetite is poor. Her family notes she is sleeping more. She is significantly less active. She denies shortness of breath, cough and fever. She is followed at [name] wound clinic for a sacral wound and right ankle wound. Her family estimates 16 ounces of fluid intake over [name] past 24 hours. No bleeding.  Objective:  Vital signs in last 24 hours:  Blood pressure 80/53, pulse 91, temperature 97.7 F (36.5 C), temperature source Axillary, resp. rate 17, height '5\' 8"'$  (1.727 m), SpO2 99 %.    HEENT: No thrush or ulcers. Oral mucosa appears moist. Resp: Lungs clear bilaterally. Cardio: Regular rate and rhythm. GI: Abdomen soft and nontender. No hepatomegaly. Vascular: No leg edema. Skin: Decreased skin turgor.  Port-A-Cath site is bandaged.  Lab Results:  Lab Results  Component Value Date   WBC 7.2 03/17/2015   HGB 8.5* 03/17/2015   HCT 26.1* 03/17/2015   MCV 81.4 03/17/2015   PLT 304 03/17/2015   NEUTROABS 5.9 03/17/2015    Imaging:  No results found.  Medications: I have reviewed [name]'s current  medications.  Assessment/Plan: 1. Stage IV non-small cell lung cancer diagnosed March 2007 status post 6 cycles of systemic chemotherapy with carboplatin and docetaxel on observation until recently when she was found to have evidence for disease recurrence with a large lumbar paraspinous mass with osseous invasion. Core biopsy was consistent with metastatic squamous cell carcinoma. She completed palliative radiation to [name] soft tissue mass in [name] lumbar area. She is currently undergoing systemic chemotherapy with carboplatin/Taxol. She completed cycle 2 02/23/2015.   Disposition: [name] has completed 2 cycles of carboplatin/Taxol. Her performance status is poor. Dr. Julien Nordmann recommends holding today's treatment.  She is hypotensive. This may be due to dehydration. She will receive IV fluids today.   We are referring her for restaging CT scans in 2 weeks. Dr. Julien Nordmann will see her in follow-up 2-3 days after [name] scans to review [name] results. Her family understands to contact [name] office in [name] interim with further problems.  [name] seen with Dr. Julien Nordmann.    Ned Card ANP/GNP-BC   03/17/2015  9:59 AM  ADDENDUM: Hematology/Oncology Attending: I had a face to face encounter with [name] today. I recommended her care plan. This is a very pleasant 81 years old white female with a stage IV non-small cell lung cancer, squamous cell carcinoma initially diagnosed in March 2007 and [name] had disease recurrence in June 2016 with positive PDL 1 expression. She is currently undergoing systemic chemotherapy with reduced dose carboplatin and paclitaxel is status post 2 cycles. [name] has rough time tolerating her current treatment with increasing fatigue  and weakness as well as lack of appetite and dehydration. She also required PRBCs transfusion after her systemic chemotherapy secondary to chemotherapy-induced anemia. I recommended for [name] to hold cycle #3 for now. We will arrange  for [name] to receive IV hydration with normal saline. For [name] lack of appetite, will start [name] on Medrol Dosepak. I will arrange for [name] to have repeat CT scan of [name] chest, abdomen and pelvis in 2 weeks for restaging of her disease. I may consider [name] for treatment with immunotherapy because of her poor tolerance to [name] systemic chemotherapy. [name] and her family agreed to [name] current plan. She was advised to call immediately if she has any concerning symptoms in [name] interval.  Disclaimer: This note was dictated with voice recognition software. Similar sounding words can inadvertently be transcribed and may be missed upon review. MOHAMED,MOHAMED K., MD 03/17/2015   Addendum 03/17/2015 at 3:20 PM  [name]'s blood pressure did not improve with [name] IV fluids. I reviewed her case with Dr. Benay Spice in Dr. Worthy Flank absence. We feel that she would be best served by hospital admission. We contacted Dr. Doyle Askew, hospitalist at [facility]. [name] will be admitted to [name] hospitalist service.

## 2015-03-17 NOTE — Progress Notes (Signed)
Admission request from cancer center. Pt with failure to thrive, hypotension, ARF and Cr 3.5. Frail and week. Pt with known stage IV lung cancer with disease recurrence in 2016 on chemo and palliative radiation. Admission to SDU requested.   Faye Ramsay, MD  [facility] Pager [phone] If 7PM-7AM, please contact night-coverage [email or web address] Password [email or web address]

## 2015-03-18 ENCOUNTER — Observation Stay (HOSPITAL_COMMUNITY): Payer: Medicare Other

## 2015-03-18 DIAGNOSIS — I959 Hypotension, unspecified: Secondary | ICD-10-CM | POA: Diagnosis not present

## 2015-03-18 DIAGNOSIS — D638 Anemia in other chronic diseases classified elsewhere: Secondary | ICD-10-CM | POA: Diagnosis not present

## 2015-03-18 DIAGNOSIS — C3490 Malignant neoplasm of unspecified part of unspecified bronchus or lung: Secondary | ICD-10-CM

## 2015-03-18 DIAGNOSIS — E876 Hypokalemia: Secondary | ICD-10-CM | POA: Diagnosis not present

## 2015-03-18 DIAGNOSIS — I950 Idiopathic hypotension: Secondary | ICD-10-CM | POA: Diagnosis not present

## 2015-03-18 DIAGNOSIS — N179 Acute kidney failure, unspecified: Secondary | ICD-10-CM | POA: Diagnosis not present

## 2015-03-18 LAB — URINE MICROSCOPIC-ADD ON

## 2015-03-18 LAB — URINALYSIS, ROUTINE W REFLEX MICROSCOPIC
Bilirubin Urine: NEGATIVE
Glucose, UA: NEGATIVE mg/dL
Ketones, ur: NEGATIVE mg/dL
Nitrite: NEGATIVE
Protein, ur: 30 mg/dL — AB
Specific Gravity, Urine: 1.013 (ref 1.005–1.030)
Urobilinogen, UA: 0.2 mg/dL (ref 0.0–1.0)
pH: 8 (ref 5.0–8.0)

## 2015-03-18 LAB — CBC
HCT: 21.5 % — ABNORMAL LOW (ref 36.0–46.0)
Hemoglobin: 7 g/dL — ABNORMAL LOW (ref 12.0–15.0)
MCH: 26.6 pg (ref 26.0–34.0)
MCHC: 32.6 g/dL (ref 30.0–36.0)
MCV: 81.7 fL (ref 78.0–100.0)
Platelets: 232 10*3/uL (ref 150–400)
RBC: 2.63 MIL/uL — ABNORMAL LOW (ref 3.87–5.11)
RDW: 15.9 % — ABNORMAL HIGH (ref 11.5–15.5)
WBC: 5.1 10*3/uL (ref 4.0–10.5)

## 2015-03-18 LAB — BASIC METABOLIC PANEL WITH GFR
Anion gap: 9 (ref 5–15)
BUN: 60 mg/dL — ABNORMAL HIGH (ref 6–20)
CO2: 22 mmol/L (ref 22–32)
Calcium: 7.4 mg/dL — ABNORMAL LOW (ref 8.9–10.3)
Chloride: 103 mmol/L (ref 101–111)
Creatinine, Ser: 3.12 mg/dL — ABNORMAL HIGH (ref 0.44–1.00)
GFR calc Af Amer: 15 mL/min — ABNORMAL LOW
GFR calc non Af Amer: 13 mL/min — ABNORMAL LOW
Glucose, Bld: 83 mg/dL (ref 65–99)
Potassium: 3.1 mmol/L — ABNORMAL LOW (ref 3.5–5.1)
Sodium: 134 mmol/L — ABNORMAL LOW (ref 135–145)

## 2015-03-18 LAB — PREPARE RBC (CROSSMATCH)

## 2015-03-18 MED ORDER — SODIUM CHLORIDE 0.9 % IJ SOLN: 10.0000 mL | INTRAMUSCULAR | Status: DC | PRN

## 2015-03-18 MED ORDER — POTASSIUM CHLORIDE CRYS ER 20 MEQ PO TBCR
40.0000 meq | EXTENDED_RELEASE_TABLET | Freq: Once | ORAL | Status: AC
  Administered 2015-03-18: 40 meq via ORAL
  Filled 2015-03-18: qty 2

## 2015-03-18 MED ORDER — MAGNESIUM SULFATE 2 GM/50ML IV SOLN
2.0000 g | Freq: Once | INTRAVENOUS | Status: AC
  Administered 2015-03-18: 2 g via INTRAVENOUS
  Filled 2015-03-18: qty 50

## 2015-03-18 MED ORDER — SODIUM CHLORIDE 0.9 % IV BOLUS (SEPSIS)
500.0000 mL | Freq: Once | INTRAVENOUS | Status: AC
  Administered 2015-03-18: 500 mL via INTRAVENOUS

## 2015-03-18 MED ORDER — SODIUM CHLORIDE 0.9 % IV SOLN
Freq: Once | INTRAVENOUS | Status: AC
  Administered 2015-03-18: 13:00:00 via INTRAVENOUS

## 2015-03-18 MED ORDER — ENOXAPARIN SODIUM 80 MG/0.8ML ~~LOC~~ SOLN
80.0000 mg | SUBCUTANEOUS | Status: DC
  Administered 2015-03-18 – 2015-03-19 (×2): 80 mg via SUBCUTANEOUS
  Filled 2015-03-18 (×3): qty 0.8

## 2015-03-18 NOTE — Progress Notes (Signed)
Patient ID: [name], female   DOB: 1933-06-23, 81 y.o.   MRN: [id number]  [facility] PROGRESS NOTE  [name] HDQ:[id number] DOB: 1933/06/08 DOA: 03/17/2015 PCP: Wende Neighbors, MD   Brief narrative:    Pt is very pleasant 81 yo female with known stage IV non-small cell lung cancer diagnosed in March 2007, with disease recurrence in June 2016 with positive PDL 1 expression, follows with Dr. Julien Nordmann, status post 6 cycles of systemic chemotherapy with carboplatin and docetaxel. Last dose was given January 16, 2006 and palliative radiotherapy to large right paraspinous mass with osseous invasion centered at L5 under the care of Dr. Valere Dross. Currently on systemic chemotherapy with carboplatin for AUC of 5 and paclitaxel 175 MG/M2 every 3 weeks with Neulasta support. First dose 02/03/2015. Status post 2 cycles. Pt presented to cancer center earlier in the day with weakness and FTT. No specific symptoms such as chest pain or shortness of breath, no fevers or chills, no abd or urinary concerns. [facility] asked to admit for evaluation and hydration and her BP at the clinic was as low as 77/49. Of note pt is followed at the wound clinic for a sacral wound and right ankle wound.   Assessment/Plan:    Principal Problem:  Hypotension - likely related to poor oral intake and FTT - improved with NS, continue IVF for now  - keep in SDU today and possible to transfer in 24 hours if SBP stable   Active Problems:  Non-small cell lung cancer [facility]) - oncologist aware of pt's admission   Severe protein-calorie malnutrition ([place]) - nutritionist consulted - continue nutritional supplements  - pt tolerating diet well    Acute renal failure superimposed on stage 3 chronic kidney disease ([place]) - likely pre renal in etiology - responding to IVF, Cr is trending down  - continue to provide IVF and repeat BMP In AM    Right hand and wrist pain - XRAY requested for evaluation  - provide analgesia as  needed    Pressure ulcer - wound care tram consulted    Acute on chronic blood loss anemia, chronic disease, malignancy and chemotx - Hg drop to 7 this AM, transfuse blood this AM, 2 U PRBC  - CBC in AM   Hypokalemia with hypomagnesemia, hyponatremia  - supplement both electrolytes and repeat BMP, Mg in AM - NA should improve with IVF  DVT prophylaxis - Heparin SQ  Code Status: Full.  Family Communication:  plan of care discussed with the patient Disposition Plan: Home when stable.   IV access:  Peripheral IV  Procedures and diagnostic studies:    Dg Chest 2 View 03/17/2015  No acute cardiopulmonary disease. 2. COPD.   Medical Consultants:  None  Other Consultants:  None  IAnti-Infectives:   None   Faye Ramsay, MD  [facility] Pager [phone]  If 7PM-7AM, please contact night-coverage [email or web address] Password [email or web address] 03/18/2015, 7:43 AM   LOS: 1 day   HPI/Subjective: No events overnight. Right wrist and hand pain, 3/10 in severity.   Objective: Filed Vitals:   03/18/15 0313 03/18/15 0400 03/18/15 0500 03/18/15 0600  BP: 90/29 95/35 110/60 117/51  Pulse: 72 74 79 80  Temp:      TempSrc:      Resp: '10 15 13 9  '$ Height:      Weight:      SpO2: 96% 95% 99% 95%    Intake/Output Summary (Last 24 hours) at 03/18/15 0743 Last data filed at 03/18/15  0600  Gross per 24 hour  Intake   1400 ml  Output      0 ml  Net   1400 ml    Exam:   General:  Pt is alert, follows commands appropriately, not in acute distress  Cardiovascular: Regular rate and rhythm, no rubs, no gallops  Respiratory: Clear to auscultation bilaterally, no wheezing, no crackles, no rhonchi  Abdomen: Soft, non tender, non distended, bowel sounds present, no guarding  Extremities: right writs pain with palpation with swelling   Data Reviewed: Basic Metabolic Panel:  Recent Labs Lab 03/17/15 0836 03/17/15 1700 03/18/15 0554  NA 136 136 134*  K 3.2* 3.3* 3.1*  CL  --  103 103   CO2 '22 24 22  '$ GLUCOSE 156* 109* 83  BUN 67.0* 66* 60*  CREATININE 3.5* 3.25* 3.12*  CALCIUM 8.3* 7.7* 7.4*  MG  --  1.5*  --   PHOS  --  3.7  --    Liver Function Tests:  Recent Labs Lab 03/17/15 0836 03/17/15 1700  AST 7 11*  ALT <9 6*  ALKPHOS 127 100  BILITOT 0.31 0.5  PROT 6.1* 5.6*  ALBUMIN 1.9* 2.0*   CBC:  Recent Labs Lab 03/17/15 0836 03/17/15 1700 03/18/15 0554  WBC 7.2 6.2 5.1  NEUTROABS 5.9  --   --   HGB 8.5* 7.8* 7.0*  HCT 26.1* 23.9* 21.5*  MCV 81.4 82.7 81.7  PLT 304 240 232   Recent Results (from the past 240 hour(s))  MRSA PCR Screening     Status: None   Collection Time: 03/17/15  4:20 PM  Result Value Ref Range Status   MRSA by PCR NEGATIVE NEGATIVE Final    Comment:        The GeneXpert MRSA Assay (FDA approved for NASAL specimens only), is one component of a comprehensive MRSA colonization surveillance program. It is not intended to diagnose MRSA infection nor to guide or monitor treatment for MRSA infections.      Scheduled Meds: . sodium chloride   Intravenous Once  . antiseptic oral rinse  7 mL Mouth Rinse q12n4p  . chlorhexidine  15 mL Mouth Rinse BID  . feeding supplement (ENSURE ENLIVE)  237 mL Oral BID BM  . heparin subcutaneous  5,000 Units Subcutaneous 3 times per day  . potassium chloride  40 mEq Oral Once  . potassium chloride  40 mEq Oral Once   Continuous Infusions: . sodium chloride 75 mL/hr at 03/17/15 1800

## 2015-03-19 ENCOUNTER — Other Ambulatory Visit: Payer: Self-pay

## 2015-03-19 DIAGNOSIS — I951 Orthostatic hypotension: Secondary | ICD-10-CM | POA: Diagnosis not present

## 2015-03-19 DIAGNOSIS — L8990 Pressure ulcer of unspecified site, unspecified stage: Secondary | ICD-10-CM

## 2015-03-19 DIAGNOSIS — E43 Unspecified severe protein-calorie malnutrition: Secondary | ICD-10-CM | POA: Diagnosis not present

## 2015-03-19 DIAGNOSIS — E876 Hypokalemia: Secondary | ICD-10-CM | POA: Diagnosis not present

## 2015-03-19 DIAGNOSIS — R627 Adult failure to thrive: Secondary | ICD-10-CM | POA: Diagnosis not present

## 2015-03-19 DIAGNOSIS — I959 Hypotension, unspecified: Secondary | ICD-10-CM | POA: Diagnosis not present

## 2015-03-19 DIAGNOSIS — C3490 Malignant neoplasm of unspecified part of unspecified bronchus or lung: Secondary | ICD-10-CM | POA: Diagnosis not present

## 2015-03-19 DIAGNOSIS — D638 Anemia in other chronic diseases classified elsewhere: Secondary | ICD-10-CM | POA: Diagnosis not present

## 2015-03-19 DIAGNOSIS — N179 Acute kidney failure, unspecified: Secondary | ICD-10-CM | POA: Diagnosis not present

## 2015-03-19 LAB — MAGNESIUM: Magnesium: 2 mg/dL (ref 1.7–2.4)

## 2015-03-19 LAB — BASIC METABOLIC PANEL WITH GFR
Anion gap: 7 (ref 5–15)
BUN: 54 mg/dL — ABNORMAL HIGH (ref 6–20)
CO2: 21 mmol/L — ABNORMAL LOW (ref 22–32)
Calcium: 7.9 mg/dL — ABNORMAL LOW (ref 8.9–10.3)
Chloride: 109 mmol/L (ref 101–111)
Creatinine, Ser: 2.64 mg/dL — ABNORMAL HIGH (ref 0.44–1.00)
GFR calc Af Amer: 19 mL/min — ABNORMAL LOW
GFR calc non Af Amer: 16 mL/min — ABNORMAL LOW
Glucose, Bld: 101 mg/dL — ABNORMAL HIGH (ref 65–99)
Potassium: 3.8 mmol/L (ref 3.5–5.1)
Sodium: 137 mmol/L (ref 135–145)

## 2015-03-19 LAB — TYPE AND SCREEN
ABO/RH(D): O POS
ANTIBODY SCREEN: NEGATIVE
UNIT DIVISION: 0
UNIT DIVISION: 0

## 2015-03-19 LAB — CBC
HCT: 30.8 % — ABNORMAL LOW (ref 36.0–46.0)
Hemoglobin: 10.3 g/dL — ABNORMAL LOW (ref 12.0–15.0)
MCH: 28.3 pg (ref 26.0–34.0)
MCHC: 33.4 g/dL (ref 30.0–36.0)
MCV: 84.6 fL (ref 78.0–100.0)
Platelets: 231 10*3/uL (ref 150–400)
RBC: 3.64 MIL/uL — ABNORMAL LOW (ref 3.87–5.11)
RDW: 15.6 % — ABNORMAL HIGH (ref 11.5–15.5)
WBC: 7 10*3/uL (ref 4.0–10.5)

## 2015-03-19 MED ORDER — PANTOPRAZOLE SODIUM 40 MG PO TBEC
40.0000 mg | DELAYED_RELEASE_TABLET | Freq: Every day | ORAL | Status: DC
  Administered 2015-03-19 – 2015-03-20 (×2): 40 mg via ORAL
  Filled 2015-03-19 (×2): qty 1

## 2015-03-19 MED ORDER — DIPHENHYDRAMINE-APAP (SLEEP) 25-500 MG PO TABS: 0.5000 | ORAL_TABLET | Freq: Every evening | ORAL | Status: DC | PRN

## 2015-03-19 MED ORDER — HYDROCODONE-ACETAMINOPHEN 5-325 MG PO TABS: 1.0000 | ORAL_TABLET | Freq: Four times a day (QID) | ORAL | Status: DC | PRN

## 2015-03-19 MED ORDER — COLLAGENASE 250 UNIT/GM EX OINT
1.0000 "application " | TOPICAL_OINTMENT | Freq: Every evening | CUTANEOUS | Status: DC | PRN
  Administered 2015-03-19: 1 via TOPICAL
  Filled 2015-03-19: qty 30

## 2015-03-19 MED ORDER — ZOLPIDEM TARTRATE 5 MG PO TABS: 5.0000 mg | ORAL_TABLET | Freq: Every evening | ORAL | Status: DC | PRN

## 2015-03-19 MED ORDER — ADULT MULTIVITAMIN W/MINERALS CH
1.0000 | ORAL_TABLET | Freq: Every day | ORAL | Status: DC
  Administered 2015-03-19 – 2015-03-20 (×2): 1 via ORAL
  Filled 2015-03-19 (×2): qty 1

## 2015-03-19 MED ORDER — METOPROLOL SUCCINATE ER 25 MG PO TB24
25.0000 mg | ORAL_TABLET | Freq: Every day | ORAL | Status: DC
  Administered 2015-03-19: 25 mg via ORAL
  Filled 2015-03-19: qty 1

## 2015-03-19 MED ORDER — POTASSIUM CHLORIDE CRYS ER 20 MEQ PO TBCR
20.0000 meq | EXTENDED_RELEASE_TABLET | Freq: Every day | ORAL | Status: DC
  Administered 2015-03-19: 20 meq via ORAL
  Filled 2015-03-19 (×2): qty 1

## 2015-03-19 NOTE — Evaluation (Signed)
Physical Therapy Evaluation Patient Details Name: [name] MRN: [id number] DOB: 1934-01-03 Today's Date: 03/19/2015   History of Present Illness  Pt is very pleasant 81 yo female adm 03/17/15 from cancer center earlier with weakness and FTT, dehydration. with known stage IV non-small cell lung cancer diagnosed in March 2007, with disease recurrence in June 2016, s/p  chemotherapy and palliative radiotherapy to large right paraspinous mass with osseous invasion centered at L5;     Clinical Impression  Pt admitted with above diagnosis. Pt currently with functional limitations due to the deficits listed below (see PT Problem List).  Pt will benefit from skilled PT to increase their independence and safety with mobility to allow discharge to the venue listed below.  Pt is very pleasant, cooperative and willing to work with PT; VSS during session today, pt position on her R side after PT/OT evals with pillows placed appropriately for pressure relief  BP 132/54, HR 90--128--100, O2 sats on RA 100%--96%--100%, RR 11--22--16     Follow Up Recommendations Home health PT    Equipment Recommendations       Recommendations for Other Services       Precautions / Restrictions Precautions Precautions: Fall;Other (comment) (sacral/ankle wounds)      Mobility  Bed Mobility Overal bed mobility: Needs Assistance;+2 for physical assistance Bed Mobility: Rolling;Supine to Sit;Sit to Supine Rolling: Min assist   Supine to sit: +2 for physical assistance;Min assist Sit to supine: Mod assist;+2 for physical assistance   General bed mobility comments: cues for use of UEs to self assist, incr time  Transfers                 General transfer comment: not attempted, pt does not tol sitting well  d/t pain  Ambulation/Gait                Stairs            Wheelchair Mobility    Modified Rankin (Stroke Patients Only)       Balance Overall balance assessment: Needs  assistance Sitting-balance support: No upper extremity supported;Feet supported Sitting balance-Leahy Scale: Fair         Standing balance comment: NT                             Pertinent Vitals/Pain Pain Assessment: 0-10 Pain Score: 4  Pain Location: back and RLE Pain Descriptors / Indicators: Tender Pain Intervention(s): Limited activity within patient's tolerance;Monitored during session    Home Living Family/patient expects to be discharged to:: Private residence Living Arrangements: Alone Available Help at Discharge: Family;Available PRN/intermittently;Friend(s) Type of Home: Mobile home Home Access: Ramped entrance     Home Layout: One level Home Equipment: Cane - single point;Walker - standard;Transport chair;Bedside commode;Hospital bed Additional Comments: dtr comes before work, son-in-law and nbor assist during day    Prior Function Level of Independence: Needs assistance   Gait / Transfers Assistance Needed: mostly bedbound; grandsons "pick her up" out of bed and in/out of car for appointments.  ADL's / Homemaking Assistance Needed: daughter gives her sponge baths in bed, changes diaper, and gets her dressed. Neighbor and son-in-law assist with repositioning in bed.   Comments: also has HHRN for wound care     Hand Dominance   Dominant Hand: Right    Extremity/Trunk Assessment   Upper Extremity Assessment: Defer to OT evaluation RUE Deficits / Details: AROM shoulder limited to 90 degrees flexion; >  3/5 strength     LUE Deficits / Details: AROM shoulder limited to 90 degrees flexion; >3/5 strength   Lower Extremity Assessment: RLE deficits/detail;LLE deficits/detail RLE Deficits / Details: grossly 3/5 knee and hip; ankle PROM WFL, df 1/5 LLE Deficits / Details: grossly 3+/5     Communication   Communication: No difficulties  Cognition Arousal/Alertness: Awake/alert Behavior During Therapy: WFL for tasks assessed/performed Overall  Cognitive Status: Within Functional Limits for tasks assessed                      General Comments General comments (skin integrity, edema, etc.): wounds R ankle and sacrum with dressings covering them    Exercises        Assessment/Plan    PT Assessment Patient needs continued PT services  PT Diagnosis Generalized weakness   PT Problem List Decreased strength;Decreased activity tolerance;Decreased mobility  PT Treatment Interventions Therapeutic activities;Therapeutic exercise;Functional mobility training;Patient/family education   PT Goals (Current goals can be found in the Care Plan section) Acute Rehab PT Goals PT Goal Formulation: With patient Time For Goal Achievement: 03/26/15 Potential to Achieve Goals: Good    Frequency Min 3X/week   Barriers to discharge        Co-evaluation PT/OT/SLP Co-Evaluation/Treatment: Yes Reason for Co-Treatment: Complexity of the patient's impairments (multi-system involvement);For patient/therapist safety PT goals addressed during session: Mobility/safety with mobility OT goals addressed during session: ADL's and self-care;Strengthening/ROM       End of Session   Activity Tolerance: Patient limited by pain Patient left: in bed;with call bell/phone within reach Nurse Communication: Mobility status    Functional Assessment Tool Used: clinical judgement Functional Limitation: Changing and maintaining body position Changing and Maintaining Body Position Current Status (K8206): At least 40 percent but less than 60 percent impaired, limited or restricted Changing and Maintaining Body Position Goal Status (O1561): At least 20 percent but less than 40 percent impaired, limited or restricted    Time: 5379-4327 PT Time Calculation (min) (ACUTE ONLY): 21 min   Charges:   PT Evaluation $Initial PT Evaluation Tier I: 1 Procedure     PT G Codes:   PT G-Codes **NOT FOR INPATIENT CLASS** Functional Assessment Tool Used: clinical  judgement Functional Limitation: Changing and maintaining body position Changing and Maintaining Body Position Current Status (M1470): At least 40 percent but less than 60 percent impaired, limited or restricted Changing and Maintaining Body Position Goal Status (L2957): At least 20 percent but less than 40 percent impaired, limited or restricted    [facility] 03/19/2015, 1:53 PM

## 2015-03-19 NOTE — Progress Notes (Signed)
Occupational Therapy Evaluation Patient Details Name: CARRIEANN SPIELBERG MRN: [id number] DOB: 1934-04-16 Today's Date: 03/19/2015    History of Present Illness Pt is very pleasant 81 yo female with known stage IV non-small cell lung cancer diagnosed in March 2007, with disease recurrence in June 2016 with positive PDL 1 expression, follows with Dr. Julien Nordmann, status post 6 cycles of systemic chemotherapy with carboplatin and docetaxel. Last dose was given January 16, 2006 and palliative radiotherapy to large right paraspinous mass with osseous invasion centered at L5 under the care of Dr. Valere Dross. Currently on systemic chemotherapy with carboplatin for AUC of 5 and paclitaxel 175 MG/M2 every 3 weeks with Neulasta support. First dose 02/03/2015. Status post 2 cycles. Pt presented to cancer center earlier in the day with weakness and FTT. No specific symptoms such as chest pain or shortness of breath, no fevers or chills, no abd or urinary concerns. [facility] asked to admit for evaluation and hydration and her BP at the clinic was as low as 77/49. Of note pt is followed at the wound clinic for a sacral wound and right ankle wound.    Clinical Impression   Patient presents to OT with decreased ADL independence and safety due to the functional deficits listed below. She will benefit from skilled OT to maximize independence and to facilitate a safe discharge.    Follow Up Recommendations  Home health OT;Supervision/Assistance - 24 hour;Other (comment) (Home Health aide)    Equipment Recommendations  None recommended by OT    Recommendations for Other Services       Precautions / Restrictions Precautions Precautions: Fall;Other (comment) (wounds)      Mobility Bed Mobility Overal bed mobility: Needs Assistance;+2 for physical assistance Bed Mobility: Rolling;Supine to Sit;Sit to Supine Rolling: Min assist   Supine to sit: +2 for physical assistance;Min assist Sit to supine: Mod assist;+2 for  physical assistance      Transfers                      Balance                                            ADL Overall ADL's : Needs assistance/impaired Eating/Feeding: Set up;Bed level   Grooming: Set up;Bed level                                 General ADL Comments: Patient reports mostly total care with ADLs at home by family. Has HHRN for wound care. Daughter sponge bathes patient in bed and changes her diaper/helps her toilet before and after work. Son-in-law and neighbor stay with her during day and reposition her. When she has to get to an appointment, her grandsons lift her out of bed into car and they have a transport chair. Discussed HHPT and aide with patient and she was agreeable.       Vision     Perception     Praxis      Pertinent Vitals/Pain Pain Assessment: 0-10 Pain Score: 4  Pain Location: back and right leg Pain Descriptors / Indicators: Tender     Hand Dominance Right   Extremity/Trunk Assessment Upper Extremity Assessment Upper Extremity Assessment: Generalized weakness;RUE deficits/detail;LUE deficits/detail RUE Deficits / Details: AROM shoulder limited to 90 degrees flexion; >3/5 strength LUE Deficits /  Details: AROM shoulder limited to 90 degrees flexion; >3/5 strength   Lower Extremity Assessment Lower Extremity Assessment: Defer to PT evaluation       Communication Communication Communication: No difficulties   Cognition Arousal/Alertness: Awake/alert Behavior During Therapy: WFL for tasks assessed/performed Overall Cognitive Status: Within Functional Limits for tasks assessed                     General Comments       Exercises       Shoulder Instructions      Home Living Family/patient expects to be discharged to:: Private residence Living Arrangements: Alone Available Help at Discharge: Family;Available PRN/intermittently;Friend(s) (dtr works; also has son-in-law and  neighbor also assist) Type of Home: Mobile home Home Access: Ramped entrance     Home Layout: One level     Bathroom Shower/Tub: Other (comment) (only sponge bathes)   Bathroom Toilet: Standard     Home Equipment: Cane - single point;Walker - standard;Transport chair;Bedside commode;Hospital bed          Prior Functioning/Environment Level of Independence: Needs assistance  Gait / Transfers Assistance Needed: mostly bedbound; grandsons "pick her up" out of bed and in/out of car for appointments. ADL's / Homemaking Assistance Needed: daughter gives her sponge baths in bed, changes diaper, and gets her dressed. Neighbor and son-in-law assist with repositioning in bed.    Comments: also has HHRN for wound care    OT Diagnosis: Generalized weakness   OT Problem List: Decreased strength;Decreased activity tolerance;Impaired balance (sitting and/or standing);Decreased knowledge of use of DME or AE;Pain   OT Treatment/Interventions: Self-care/ADL training;Therapeutic exercise;DME and/or AE instruction;Therapeutic activities;Patient/family education    OT Goals(Current goals can be found in the care plan section) Acute Rehab OT Goals OT Goal Formulation: With patient Time For Goal Achievement: 04/02/15 Potential to Achieve Goals: Fair  OT Frequency: Min 2X/week   Barriers to D/C:            Co-evaluation PT/OT/SLP Co-Evaluation/Treatment: Yes Reason for Co-Treatment: Complexity of the patient's impairments (multi-system involvement);For patient/therapist safety PT goals addressed during session: Mobility/safety with mobility OT goals addressed during session: ADL's and self-care;Strengthening/ROM      End of Session Nurse Communication: Mobility status  Activity Tolerance: Patient tolerated treatment well Patient left: in bed;with call bell/phone within reach   Time: 1101-1122 OT Time Calculation (min): 21 min Charges:  OT General Charges $OT Visit: 1 Procedure OT  Evaluation $Initial OT Evaluation Tier I: 1 Procedure G-Codes: OT G-codes **NOT FOR INPATIENT CLASS** Functional Assessment Tool Used: clinical judgment Functional Limitation: Self care Self Care Current Status (B0962): At least 60 percent but less than 80 percent impaired, limited or restricted Self Care Goal Status (E3662): At least 20 percent but less than 40 percent impaired, limited or restricted  Sumeet Geter A 03/19/2015, 11:57 AM

## 2015-03-19 NOTE — Progress Notes (Signed)
[name] 1231 TRANSFERRED TO 1412 IN BED- NO APPARENT CHANGE. REPORT GIVEN TO JESSICA RN.

## 2015-03-19 NOTE — Progress Notes (Addendum)
Patient ID: [name], female   DOB: Jan 24, 1934, 81 y.o.   MRN: [id number]  [facility] PROGRESS NOTE  [name] GGE:[id number] DOB: 29-May-1934 DOA: 03/17/2015 PCP: Wende Neighbors, MD   Brief narrative:    Pt is very pleasant 81 yo female with known stage IV non-small cell lung cancer diagnosed in March 2007, with disease recurrence in June 2016 with positive PDL 1 expression, follows with Dr. Julien Nordmann, status post 6 cycles of systemic chemotherapy with carboplatin and docetaxel. Last dose was given January 16, 2006 and palliative radiotherapy to large right paraspinous mass with osseous invasion centered at L5 under the care of Dr. Valere Dross. Currently on systemic chemotherapy with carboplatin for AUC of 5 and paclitaxel 175 MG/M2 every 3 weeks with Neulasta support. First dose 02/03/2015. Status post 2 cycles. Pt presented to cancer center earlier in the day with weakness and FTT. No specific symptoms such as chest pain or shortness of breath, no fevers or chills, no abd or urinary concerns. [facility] asked to admit for evaluation and hydration and her BP at the clinic was as low as 77/49. Of note pt is followed at the wound clinic for a sacral wound and right ankle wound.   Assessment/Plan:    Principal Problem:  Hypotension - likely related to poor oral intake and FTT - improved with NS, reasonably stable in the past 24 hours  - stable to transfer to tele bed today   Active Problems:   LLE DVT - continue with Lovenox per pharmacy    Non-small cell lung cancer [facility]) - oncologist aware of pt's admission   Severe protein-calorie malnutrition ([place]) - nutritionist consulted - pt tolerating diet well  - continue nutritional supplements    Acute renal failure superimposed on stage 3 chronic kidney disease ([place]) - likely pre renal in etiology - responding to IVF, Cr is trending down  - will stop IVF and allow pt to orally hydrate herself - repeat BMP in AM    Right hand and  wrist pain - XRAY requested for evaluation, no acute findings - now better this AM - provide analgesia as needed    Pressure ulcer - wound care tram consulted    Acute on chronic blood loss anemia, chronic disease, malignancy and chemotx - Hg drop to 7 10/15, appropriate increase in Hg post transfusion to 10.3 this AM - CBC in AM   Hypokalemia with hypomagnesemia, hyponatremia  - supplemented, WNL this AM  DVT prophylaxis - Heparin SQ  Code Status: Full.  Family Communication:  plan of care discussed with the patient Disposition Plan: Home in AM  IV access:  Peripheral IV  Procedures and diagnostic studies:    Dg Chest 2 View 03/17/2015  No acute cardiopulmonary disease. 2. COPD.   Medical Consultants:  None  Other Consultants:  None  IAnti-Infectives:   None   Faye Ramsay, MD  [facility] Pager [phone]  If 7PM-7AM, please contact night-coverage [email or web address] Password [email or web address] 03/19/2015, 10:35 AM   LOS: 2 days   HPI/Subjective: No events overnight. Right wrist and hand pain resolved.   Objective: Filed Vitals:   03/19/15 0600 03/19/15 0700 03/19/15 0800 03/19/15 0900  BP: 91/50 110/64 110/62 123/72  Pulse: 88 94 96 94  Temp:      TempSrc:      Resp: '11 11 15 11  '$ Height:      Weight:      SpO2: 98% 96% 97% 98%    Intake/Output Summary (Last 24 hours)  at 03/19/15 1035 Last data filed at 03/19/15 0700  Gross per 24 hour  Intake 2261.25 ml  Output    410 ml  Net 1851.25 ml    Exam:   General:  Pt is alert, follows commands appropriately, not in acute distress  Cardiovascular: Regular rate and rhythm, no rubs, no gallops  Respiratory: Clear to auscultation bilaterally, no wheezing, no crackles, no rhonchi  Abdomen: Soft, non tender, non distended, bowel sounds present, no guarding  Extremities: right writs pain with palpation with swelling   Data Reviewed: Basic Metabolic Panel:  Recent Labs Lab 03/17/15 0836 03/17/15 1700  03/18/15 0554 03/19/15 0546  NA 136 136 134* 137  K 3.2* 3.3* 3.1* 3.8  CL  --  103 103 109  CO2 '22 24 22 '$ 21*  GLUCOSE 156* 109* 83 101*  BUN 67.0* 66* 60* 54*  CREATININE 3.5* 3.25* 3.12* 2.64*  CALCIUM 8.3* 7.7* 7.4* 7.9*  MG  --  1.5*  --  2.0  PHOS  --  3.7  --   --    Liver Function Tests:  Recent Labs Lab 03/17/15 0836 03/17/15 1700  AST 7 11*  ALT <9 6*  ALKPHOS 127 100  BILITOT 0.31 0.5  PROT 6.1* 5.6*  ALBUMIN 1.9* 2.0*   CBC:  Recent Labs Lab 03/17/15 0836 03/17/15 1700 03/18/15 0554 03/19/15 0546  WBC 7.2 6.2 5.1 7.0  NEUTROABS 5.9  --   --   --   HGB 8.5* 7.8* 7.0* 10.3*  HCT 26.1* 23.9* 21.5* 30.8*  MCV 81.4 82.7 81.7 84.6  PLT 304 240 232 231   Recent Results (from the past 240 hour(s))  MRSA PCR Screening     Status: None   Collection Time: 03/17/15  4:20 PM  Result Value Ref Range Status   MRSA by PCR NEGATIVE NEGATIVE Final    Comment:        The GeneXpert MRSA Assay (FDA approved for NASAL specimens only), is one component of a comprehensive MRSA colonization surveillance program. It is not intended to diagnose MRSA infection nor to guide or monitor treatment for MRSA infections.   Urine culture     Status: None (Preliminary result)   Collection Time: 03/18/15  1:30 PM  Result Value Ref Range Status   Specimen Description URINE, CLEAN CATCH  Final   Special Requests NONE  Final   Culture   Final    TOO YOUNG TO READ Performed at [facility]    Report Status PENDING  Incomplete     Scheduled Meds: . enoxaparin (LOVENOX) injection  80 mg Subcutaneous Q24H  . feeding supplement (ENSURE ENLIVE)  237 mL Oral BID BM  . potassium chloride  40 mEq Oral Once   Continuous Infusions: . sodium chloride 1,000 mL (03/19/15 0925)

## 2015-03-19 NOTE — Progress Notes (Signed)
Initial Nutrition Assessment  DOCUMENTATION CODES:   Severe malnutrition in context of chronic illness, Underweight  INTERVENTION:   -If poor PO intake persists, may need to consider appetite stimulant. -D/C Ensure per pt request -Provide Magic cup TID with meals, each supplement provides 290 kcal and 9 grams of protein  -Multivitamin with minerals daily -Encourage PO intake -RD to continue to monitor  NUTRITION DIAGNOSIS:   Malnutrition related to chronic illness, cancer and cancer related treatments as evidenced by percent weight loss, energy intake < or equal to 75% for > or equal to 1 month, mild depletion of body fat, moderate depletions of muscle mass.  GOAL:   Patient will meet greater than or equal to 90% of their needs  MONITOR:   PO intake, Supplement acceptance, Labs, Weight trends, Skin, I & O's  REASON FOR ASSESSMENT:   Consult Poor PO  ASSESSMENT:   81 yo female with known stage IV non-small cell lung cancer diagnosed in March 2007, with disease recurrence in June 2016 with positive PDL 1 expression, status post 6 cycles of systemic chemotherapy with carboplatin and docetaxel.   Pt in room with breakfast tray in front of her. Pt states she has not eaten for over a month. Her diet mainly consisted of buttermilk, lemonade and yogurt. Pt states she cannot tolerate ensure/boost supplements as they cause loose stools. RD to d/c ensure order. Pt is willing to try Magic Cups. Pt would benefit from a multivitamin with minerals. RD to order. If poor PO intake persists, may need to consider appetite stimulant.  Per weight history, pt has lost 45 lb since 8/24 (29% weight loss x <2 months, significant for time frame).  Nutrition-Focused physical exam completed. Findings are mild fat depletion, moderate muscle depletion, and no edema.   Labs reviewed: Elevated BUN & Creatinine  Diet Order:  Diet regular Room service appropriate?: Yes; Fluid consistency:: Thin  Skin:   Wound (see comment) (sacral pressure ulcer, deep tissue ulcer on heel)  Last BM:  10/15  Height:   Ht Readings from Last 1 Encounters:  03/17/15 '5\' 8"'$  (1.727 m)    Weight:   Wt Readings from Last 1 Encounters:  03/17/15 109 lb 5.6 oz (49.6 kg)    Ideal Body Weight:  63.6 kg  BMI:  Body mass index is 16.63 kg/(m^2).  Estimated Nutritional Needs:   Kcal:  1500-1700  Protein:  70-80g  Fluid:  1.6L/day  EDUCATION NEEDS:   No education needs identified at this time  Clayton Bibles, MS, RD, LDN Pager: [phone] After Hours Pager: [phone]

## 2015-03-20 DIAGNOSIS — N179 Acute kidney failure, unspecified: Secondary | ICD-10-CM | POA: Diagnosis not present

## 2015-03-20 DIAGNOSIS — I951 Orthostatic hypotension: Secondary | ICD-10-CM | POA: Diagnosis not present

## 2015-03-20 DIAGNOSIS — I959 Hypotension, unspecified: Secondary | ICD-10-CM | POA: Diagnosis not present

## 2015-03-20 DIAGNOSIS — D638 Anemia in other chronic diseases classified elsewhere: Secondary | ICD-10-CM | POA: Diagnosis not present

## 2015-03-20 DIAGNOSIS — E876 Hypokalemia: Secondary | ICD-10-CM | POA: Diagnosis not present

## 2015-03-20 LAB — CBC
HCT: 30.6 % — ABNORMAL LOW (ref 36.0–46.0)
HEMOGLOBIN: 10 g/dL — AB (ref 12.0–15.0)
MCH: 27.3 pg (ref 26.0–34.0)
MCHC: 32.7 g/dL (ref 30.0–36.0)
MCV: 83.6 fL (ref 78.0–100.0)
Platelets: 235 10*3/uL (ref 150–400)
RBC: 3.66 MIL/uL — ABNORMAL LOW (ref 3.87–5.11)
RDW: 15.9 % — ABNORMAL HIGH (ref 11.5–15.5)
WBC: 5.7 10*3/uL (ref 4.0–10.5)

## 2015-03-20 LAB — BASIC METABOLIC PANEL
Anion gap: 5 (ref 5–15)
BUN: 53 mg/dL — ABNORMAL HIGH (ref 6–20)
CALCIUM: 7.9 mg/dL — AB (ref 8.9–10.3)
CHLORIDE: 112 mmol/L — AB (ref 101–111)
CO2: 20 mmol/L — ABNORMAL LOW (ref 22–32)
CREATININE: 2.51 mg/dL — AB (ref 0.44–1.00)
GFR calc non Af Amer: 17 mL/min — ABNORMAL LOW (ref >60)
GFR, EST AFRICAN AMERICAN: 20 mL/min — AB (ref >60)
Glucose, Bld: 82 mg/dL (ref 65–99)
Potassium: 4.3 mmol/L (ref 3.5–5.1)
SODIUM: 137 mmol/L (ref 135–145)

## 2015-03-20 LAB — MAGNESIUM: MAGNESIUM: 1.9 mg/dL (ref 1.7–2.4)

## 2015-03-20 MED ORDER — HEPARIN SOD (PORK) LOCK FLUSH 100 UNIT/ML IV SOLN
500.0000 [IU] | INTRAVENOUS | Status: AC | PRN
  Administered 2015-03-20: 500 [IU]

## 2015-03-20 NOTE — Discharge Summary (Signed)
Physician Discharge Summary  [name] LHT:[id number] DOB: 1934/04/03 DOA: 03/17/2015  PCP: [name] Neighbors, MD  Admit date: 03/17/2015 Discharge date: 03/20/2015  Recommendations for Outpatient Follow-up:  1. Pt will need to follow up with PCP in 2-3 weeks post discharge 2. Please obtain BMP to evaluate electrolytes and kidney function 3. Please also check CBC to evaluate Hg and Hct levels  Discharge Diagnoses:  Principal Problem:   Hypotension Active Problems:   Non-small cell lung cancer (HCC)   Severe protein-calorie malnutrition (HCC)   Acute renal failure superimposed on stage 3 chronic kidney disease (HCC)   Pressure ulcer   Anemia, chronic disease   Anemia of chronic disease   Hypokalemia  Discharge Condition: Stable  Diet recommendation: Heart healthy diet discussed in details    Brief narrative:    Pt is very pleasant 80 yo female with known stage IV non-small cell lung cancer diagnosed in March 2007, with disease recurrence in June 2016 with positive PDL 1 expression, follows with Dr. Julien Nordmann, status post 6 cycles of systemic chemotherapy with carboplatin and docetaxel. Last dose was given January 16, 2006 and palliative radiotherapy to large right paraspinous mass with osseous invasion centered at L5 under the care of Dr. Valere Dross. Currently on systemic chemotherapy with carboplatin for AUC of 5 and paclitaxel 175 MG/M2 every 3 weeks with Neulasta support. First dose 02/03/2015. Status post 2 cycles. Pt presented to cancer center earlier in the day with weakness and FTT. No specific symptoms such as chest pain or shortness of breath, no fevers or chills, no abd or urinary concerns. TRH asked to admit for evaluation and hydration and her BP at the clinic was as low as 77/49. Of note pt is followed at the wound clinic for a sacral wound and right ankle wound.   Assessment/Plan:    Principal Problem:  Hypotension - likely related to poor oral intake and FTT -  improved with NS, reasonably stable in the past 48 hours  - pt wants to go home and she is stable for discharge   Active Problems:  LLE DVT - continue with Lovenox per home regimen    Non-small cell lung cancer [facility]) - oncologist aware of pt's admission   Severe protein-calorie malnutrition ([place]) - nutritionist consulted - pt tolerating diet well  - continue nutritional supplements    Acute renal failure superimposed on stage 3 chronic kidney disease ([place]) - likely pre renal in etiology - responding to IVF, Cr is trending down    Right hand and wrist pain - XRAY requested for evaluation, no acute findings - now better this AM - provide analgesia as needed    Pressure ulcer - wound care tram consulted    Acute on chronic blood loss anemia, chronic disease, malignancy and chemotx - Hg drop to 7 10/15, appropriate increase in Hg post transfusion to 10.3 this AM   Hypokalemia with hypomagnesemia, hyponatremia  - supplemented, WNL this AM  Code Status: Full.  Family Communication: plan of care discussed with the patient Disposition Plan: Home   IV access:  Peripheral IV  Procedures and diagnostic studies:   Dg Chest 2 View 03/17/2015 No acute cardiopulmonary disease. 2. COPD.   Medical Consultants:  None  Other Consultants:  None  IAnti-Infectives:   None        Discharge Exam: Filed Vitals:   03/20/15 0603  BP: 97/54  Pulse: 94  Temp: 98.1 F (36.7 C)  Resp: 16   Filed Vitals:  03/19/15 1616 03/19/15 2117 03/20/15 0251 03/20/15 0603  BP: 111/70 89/46 112/66 97/54  Pulse: 90 86  94  Temp: 98.6 F (37 C) 98 F (36.7 C)  98.1 F (36.7 C)  TempSrc: Oral Oral  Oral  Resp: '16 16  16  '$ Height:      Weight:      SpO2: 99% 99%  100%    General: Pt is alert, follows commands appropriately, not in acute distress Cardiovascular: Regular rate and rhythm, no rubs, no gallops Respiratory: Clear to auscultation bilaterally,  no wheezing, no crackles, no rhonchi Abdominal: Soft, non tender, non distended, bowel sounds +, no guarding  Discharge Instructions  Discharge Instructions    Diet - low sodium heart healthy    Complete by:  As directed      Increase activity slowly    Complete by:  As directed             Medication List    STOP taking these medications        cholestyramine light 4 G packet  Commonly known as:  PREVALITE     cloNIDine 0.2 MG tablet  Commonly known as:  CATAPRES     furosemide 40 MG tablet  Commonly known as:  LASIX     LORazepam 1 MG tablet  Commonly known as:  ATIVAN     methylPREDNISolone 4 MG Tbpk tablet  Commonly known as:  MEDROL DOSEPAK     polyethylene glycol packet  Commonly known as:  MIRALAX / GLYCOLAX     venlafaxine 37.5 MG tablet  Commonly known as:  EFFEXOR      TAKE these medications        diphenhydramine-acetaminophen 25-500 MG Tabs tablet  Commonly known as:  TYLENOL PM  Take 0.5-1 tablets by mouth at bedtime as needed (pain/sleep).     diphenoxylate-atropine 2.5-0.025 MG tablet  Commonly known as:  LOMOTIL  Take 1 tablet by mouth 4 (four) times daily as needed for diarrhea or loose stools.     enoxaparin 80 MG/0.8ML injection  Commonly known as:  LOVENOX  Inject 0.8 mLs (80 mg total) into the skin daily.     HYDROcodone-acetaminophen 5-325 MG tablet  Commonly known as:  NORCO/VICODIN  Take 1 tablet by mouth every 6 (six) hours as needed for moderate pain. Max APAP 3gm/24 hrs from all sources     lidocaine-prilocaine cream  Commonly known as:  EMLA  Apply 1 application topically as needed.     metoprolol succinate 25 MG 24 hr tablet  Commonly known as:  TOPROL-XL  Take 1 tablet (25 mg total) by mouth daily.     pantoprazole 40 MG tablet  Commonly known as:  PROTONIX  TAKE ONE TABLET BY MOUTH ONCE DAILY.     potassium chloride SA 20 MEQ tablet  Commonly known as:  K-DUR,KLOR-CON  Take 1 tablet (20 mEq total) by mouth daily.      prochlorperazine 10 MG tablet  Commonly known as:  COMPAZINE  TAKE (1) TABLET BY MOUTH EVERY SIX HOURS AS NEEDED FOR NAUSEA AND VOMITING.     SANTYL ointment  Generic drug:  collagenase  Apply 1 application topically at bedtime as needed. For bed sores            Follow-up Information    Follow up with [name] Neighbors, MD.   Specialty:  Internal Medicine   Contact information:   [address] [place] [address] [phone]  Call Faye Ramsay, MD.   Specialty:  Internal Medicine   Why:  As needed call my cell phone [phone]   Contact information:   [address] [place] [address] [phone]        The results of significant diagnostics from this hospitalization (including imaging, microbiology, ancillary and laboratory) are listed below for reference.     Microbiology: Recent Results (from the past 240 hour(s))  MRSA PCR Screening     Status: None   Collection Time: 03/17/15  4:20 PM  Result Value Ref Range Status   MRSA by PCR NEGATIVE NEGATIVE Final    Comment:        The GeneXpert MRSA Assay (FDA approved for NASAL specimens only), is one component of a comprehensive MRSA colonization surveillance program. It is not intended to diagnose MRSA infection nor to guide or monitor treatment for MRSA infections.   Urine culture     Status: None (Preliminary result)   Collection Time: 03/18/15  1:30 PM  Result Value Ref Range Status   Specimen Description URINE, CLEAN CATCH  Final   Special Requests NONE  Final   Culture   Final    TOO YOUNG TO READ Performed at [facility]    Report Status PENDING  Incomplete     Labs: Basic Metabolic Panel:  Recent Labs Lab 03/17/15 0836 03/17/15 1700 03/18/15 0554 03/19/15 0546 03/20/15 0455  NA 136 136 134* 137 137  K 3.2* 3.3* 3.1* 3.8 4.3  CL  --  103 103 109 112*  CO2 '22 24 22 '$ 21* 20*  GLUCOSE 156* 109* 83 101* 82  BUN 67.0* 66* 60* 54* 53*   CREATININE 3.5* 3.25* 3.12* 2.64* 2.51*  CALCIUM 8.3* 7.7* 7.4* 7.9* 7.9*  MG  --  1.5*  --  2.0 1.9  PHOS  --  3.7  --   --   --    Liver Function Tests:  Recent Labs Lab 03/17/15 0836 03/17/15 1700  AST 7 11*  ALT <9 6*  ALKPHOS 127 100  BILITOT 0.31 0.5  PROT 6.1* 5.6*  ALBUMIN 1.9* 2.0*   CBC:  Recent Labs Lab 03/17/15 0836 03/17/15 1700 03/18/15 0554 03/19/15 0546 03/20/15 0455  WBC 7.2 6.2 5.1 7.0 5.7  NEUTROABS 5.9  --   --   --   --   HGB 8.5* 7.8* 7.0* 10.3* 10.0*  HCT 26.1* 23.9* 21.5* 30.8* 30.6*  MCV 81.4 82.7 81.7 84.6 83.6  PLT 304 240 232 231 235   SIGNED: Time coordinating discharge: 30 minutes  MAGICK-MYERS, ISKRA, MD  [facility] 03/20/2015, 9:56 AM Pager [phone]  If 7PM-7AM, please contact night-coverage [email or web address] Password [email or web address]

## 2015-03-20 NOTE — Consult Note (Signed)
WOC wound consult note Reason for Consult: Patient seen for evaluation and provision of acute care orders of chronic wounds.  Patient is seen by [facility]) and outpatient wound care center. There orders will supercede mine upon discharge and follow up in that setting is recommended. Wound type:Chronic pressure injuries Pressure Ulcer POA: Yes Measurement:left medial malleolus:  2cm round x 0.5cm depth. Red with yellow slough in wound bed. Scant exudate (serous to light yellow on old dressings). Sacral:  7cm x 4.5cm x 0.4cm with undermining from 6 o'clock to 12 o'clock measuring 3cm at deepest point, but consistently 2cm. Pale, smooth, pink wound bed with moderate amount of light yellow exudate.There is a 1cm round x 0.2cm area of medical adhesive related skin injury (MARSI) located on the left buttock at 7 o'clock from the sacral ulcer. Wound bed:As described above. Drainage (amount, consistency, odor) As described above. Periwound:intact, dry. Dressing procedure/placement/frequency: I am providing today bilateral pressure redistribution boots as the patient cannot recall if she has these or not at home.  She reports having a gel mattress of some sort for pressure redistribution. Patient will be following up with the outpatient wound care center, but with the exudate and undermining today, I have provided provisional orders for absorption of exudate and moisture retentive dressings performed once daily. Of course, the cornerstone of her care plan should be turning and repositioning to avoid the supine position except for meals and her son and caregiver (as well as the patient) are taught this today. Both indicate understanding. [facility] nursing team will not follow, but will remain available to this patient, the nursing and medical teams.  Please re-consult if needed. Thanks, Maudie Flakes, MSN, RN, [place], [place], [place] [phone])

## 2015-03-20 NOTE — Discharge Instructions (Signed)
Hypotension  As your heart beats, it forces blood through your arteries. This force is your blood pressure. If your blood pressure is too low for you to go about your normal activities or to support the organs of your body, you have hypotension. Hypotension is also referred to as low blood pressure. When your blood pressure becomes too low, you may not get enough blood to your brain. As a result, you may feel weak, feel lightheaded, or develop a rapid heart rate. In a more severe case, you may faint.  CAUSES  Various conditions can cause hypotension. These include:  · Blood loss.  · Dehydration.  · Heart or endocrine problems.  · Pregnancy.  · Severe infection.  · Not having a well-balanced diet filled with needed nutrients.  · Severe allergic reactions (anaphylaxis).  Some medicines, such as blood pressure medicine or water pills (diuretics), may lower your blood pressure below normal. Sometimes taking too much medicine or taking medicine not as directed can cause hypotension.  TREATMENT   Hospitalization is sometimes required for hypotension if fluid or blood replacement is needed, if time is needed for medicines to wear off, or if further monitoring is needed. Treatment might include changing your diet, changing your medicines (including medicines aimed at raising your blood pressure), and use of support stockings.  HOME CARE INSTRUCTIONS   · Drink enough fluids to keep your urine clear or pale yellow.  · Take your medicines as directed by your health care provider.  · Get up slowly from reclining or sitting positions. This gives your blood pressure a chance to adjust.  · Wear support stockings as directed by your health care provider.  · Maintain a healthy diet by including nutritious food, such as fruits, vegetables, nuts, whole grains, and lean meats.  SEEK MEDICAL CARE IF:  · You have vomiting or diarrhea.  · You have a fever for more than 2-3 days.  · You feel more thirsty than usual.  · You feel weak and  tired.  SEEK IMMEDIATE MEDICAL CARE IF:   · You have chest pain or a fast or irregular heartbeat.  · You have a loss of feeling in some part of your body, or you lose movement in your arms or legs.  · You have trouble speaking.  · You become sweaty or feel lightheaded.  · You faint.  MAKE SURE YOU:   · Understand these instructions.  · Will watch your condition.  · Will get help right away if you are not doing well or get worse.     This information is not intended to replace advice given to you by your health care provider. Make sure you discuss any questions you have with your health care provider.     Document Released: 05/20/2005 Document Revised: 03/10/2013 Document Reviewed: 11/20/2012  Elsevier Interactive Patient Education ©2016 Elsevier Inc.

## 2015-03-20 NOTE — Care Management Note (Signed)
Case Management Note  Patient Details  Name: NUSAIBA GUALLPA MRN: [id number] Date of Birth: May 04, 1934  Subjective/Objective: Spoke to patient/Grandson Ryan in rm,while dtr [name] on phone to offer [facility] choice-they initially agreed to [facility], but afterwards as I kept describing services, they said we have [facility] for the wound care.  Confirmed that [place] active rep Tim aware of d/c & HHC orders. Patient goes to wound care clinic.Dtr/Grandson-Sondra/Ryan to support @ home.                   Action/Plan:d/c home w/HHC.   Expected Discharge Date:   (unknown)               Expected Discharge Plan:  [place]  In-House Referral:     Discharge planning Services  CM Consult  Post Acute Care Choice:  [place] (Active w/[facility]) Choice offered to:  Patient  DME Arranged:    DME Agency:     HH Arranged:  RN, PT, OT, Nurse's Aide HH Agency:  [place]  Status of Service:  Completed, signed off  Medicare Important Message Given:    Date Medicare IM Given:    Medicare IM give by:    Date Additional Medicare IM Given:    Additional Medicare Important Message give by:     If discussed at [place] of Stay Meetings, dates discussed:    Additional Comments:  Dessa Phi, RN 03/20/2015, 10:47 AM

## 2015-03-21 ENCOUNTER — Ambulatory Visit (INDEPENDENT_AMBULATORY_CARE_PROVIDER_SITE_OTHER): Payer: Medicare Other | Admitting: Urology

## 2015-03-21 DIAGNOSIS — N3020 Other chronic cystitis without hematuria: Secondary | ICD-10-CM

## 2015-03-21 LAB — URINE CULTURE: Culture: >=100000

## 2015-03-24 ENCOUNTER — Other Ambulatory Visit (HOSPITAL_BASED_OUTPATIENT_CLINIC_OR_DEPARTMENT_OTHER): Payer: Medicare Other

## 2015-03-24 ENCOUNTER — Other Ambulatory Visit (HOSPITAL_COMMUNITY): Payer: Medicare Other

## 2015-03-24 ENCOUNTER — Other Ambulatory Visit: Payer: Medicare Other

## 2015-03-24 ENCOUNTER — Ambulatory Visit (HOSPITAL_BASED_OUTPATIENT_CLINIC_OR_DEPARTMENT_OTHER): Payer: Medicare Other

## 2015-03-24 DIAGNOSIS — Z452 Encounter for adjustment and management of vascular access device: Secondary | ICD-10-CM | POA: Diagnosis not present

## 2015-03-24 DIAGNOSIS — C7951 Secondary malignant neoplasm of bone: Secondary | ICD-10-CM | POA: Diagnosis not present

## 2015-03-24 DIAGNOSIS — C3492 Malignant neoplasm of unspecified part of left bronchus or lung: Secondary | ICD-10-CM

## 2015-03-24 DIAGNOSIS — Z95828 Presence of other vascular implants and grafts: Secondary | ICD-10-CM

## 2015-03-24 LAB — CBC WITH DIFFERENTIAL/PLATELET
BASO%: 0.4 % (ref 0.0–2.0)
BASOS ABS: 0 10*3/uL (ref 0.0–0.1)
EOS%: 0 % (ref 0.0–7.0)
Eosinophils Absolute: 0 10*3/uL (ref 0.0–0.5)
HEMATOCRIT: 33.6 % — AB (ref 34.8–46.6)
HGB: 10.9 g/dL — ABNORMAL LOW (ref 11.6–15.9)
LYMPH#: 0.4 10*3/uL — AB (ref 0.9–3.3)
LYMPH%: 8.2 % — ABNORMAL LOW (ref 14.0–49.7)
MCH: 27.5 pg (ref 25.1–34.0)
MCHC: 32.4 g/dL (ref 31.5–36.0)
MCV: 84.9 fL (ref 79.5–101.0)
MONO#: 0.3 10*3/uL (ref 0.1–0.9)
MONO%: 6.6 % (ref 0.0–14.0)
NEUT#: 4 10*3/uL (ref 1.5–6.5)
NEUT%: 84.8 % — AB (ref 38.4–76.8)
Platelets: 288 10*3/uL (ref 145–400)
RBC: 3.96 10*6/uL (ref 3.70–5.45)
RDW: 17.3 % — AB (ref 11.2–14.5)
WBC: 4.8 10*3/uL (ref 3.9–10.3)

## 2015-03-24 LAB — COMPREHENSIVE METABOLIC PANEL (CC13)
AST: 10 U/L (ref 5–34)
Albumin: 1.7 g/dL — ABNORMAL LOW (ref 3.5–5.0)
Alkaline Phosphatase: 114 U/L (ref 40–150)
Anion Gap: 8 mEq/L (ref 3–11)
BUN: 54.3 mg/dL — AB (ref 7.0–26.0)
CHLORIDE: 113 meq/L — AB (ref 98–109)
CO2: 17 meq/L — AB (ref 22–29)
CREATININE: 2.3 mg/dL — AB (ref 0.6–1.1)
Calcium: 7.9 mg/dL — ABNORMAL LOW (ref 8.4–10.4)
EGFR: 20 mL/min/{1.73_m2} — ABNORMAL LOW (ref >90)
GLUCOSE: 110 mg/dL (ref 70–140)
POTASSIUM: 4.4 meq/L (ref 3.5–5.1)
SODIUM: 139 meq/L (ref 136–145)
Total Bilirubin: <0.3 mg/dL (ref 0.20–1.20)
Total Protein: 5.2 g/dL — ABNORMAL LOW (ref 6.4–8.3)

## 2015-03-24 MED ORDER — SODIUM CHLORIDE 0.9 % IJ SOLN
10.0000 mL | INTRAMUSCULAR | Status: DC | PRN
  Administered 2015-03-24: 10 mL via INTRAVENOUS
  Filled 2015-03-24: qty 10

## 2015-03-24 MED ORDER — HEPARIN SOD (PORK) LOCK FLUSH 100 UNIT/ML IV SOLN
500.0000 [IU] | Freq: Once | INTRAVENOUS | Status: AC
  Administered 2015-03-24: 500 [IU] via INTRAVENOUS
  Filled 2015-03-24: qty 5

## 2015-03-29 ENCOUNTER — Ambulatory Visit (HOSPITAL_COMMUNITY)
Admission: RE | Admit: 2015-03-29 | Discharge: 2015-03-29 | Disposition: A | Discharge status: Home / Self Care | Payer: Medicare Other | Source: Ambulatory Visit | Attending: Nurse Practitioner | Admitting: Nurse Practitioner

## 2015-03-29 DIAGNOSIS — C7951 Secondary malignant neoplasm of bone: Secondary | ICD-10-CM | POA: Insufficient documentation

## 2015-03-29 DIAGNOSIS — Z08 Encounter for follow-up examination after completed treatment for malignant neoplasm: Secondary | ICD-10-CM | POA: Diagnosis present

## 2015-03-29 DIAGNOSIS — R590 Localized enlarged lymph nodes: Secondary | ICD-10-CM | POA: Insufficient documentation

## 2015-03-29 DIAGNOSIS — C3490 Malignant neoplasm of unspecified part of unspecified bronchus or lung: Secondary | ICD-10-CM | POA: Diagnosis present

## 2015-03-29 DIAGNOSIS — C7800 Secondary malignant neoplasm of unspecified lung: Secondary | ICD-10-CM | POA: Diagnosis not present

## 2015-03-30 ENCOUNTER — Other Ambulatory Visit: Payer: Self-pay | Admitting: *Deleted

## 2015-03-30 DIAGNOSIS — C3490 Malignant neoplasm of unspecified part of unspecified bronchus or lung: Secondary | ICD-10-CM

## 2015-03-31 ENCOUNTER — Ambulatory Visit (HOSPITAL_BASED_OUTPATIENT_CLINIC_OR_DEPARTMENT_OTHER): Payer: Medicare Other

## 2015-03-31 ENCOUNTER — Other Ambulatory Visit (HOSPITAL_BASED_OUTPATIENT_CLINIC_OR_DEPARTMENT_OTHER): Payer: Medicare Other

## 2015-03-31 ENCOUNTER — Other Ambulatory Visit (HOSPITAL_COMMUNITY): Payer: Medicare Other

## 2015-03-31 ENCOUNTER — Other Ambulatory Visit: Payer: Medicare Other

## 2015-03-31 VITALS — BP 91/55 | HR 71 | Temp 96.9°F | Resp 16

## 2015-03-31 DIAGNOSIS — C7951 Secondary malignant neoplasm of bone: Secondary | ICD-10-CM

## 2015-03-31 DIAGNOSIS — C3490 Malignant neoplasm of unspecified part of unspecified bronchus or lung: Secondary | ICD-10-CM

## 2015-03-31 LAB — CBC WITH DIFFERENTIAL/PLATELET
BASO%: 0.5 % (ref 0.0–2.0)
Basophils Absolute: 0 10*3/uL (ref 0.0–0.1)
EOS%: 0.1 % (ref 0.0–7.0)
Eosinophils Absolute: 0 10*3/uL (ref 0.0–0.5)
HCT: 32.5 % — ABNORMAL LOW (ref 34.8–46.6)
HGB: 10.7 g/dL — ABNORMAL LOW (ref 11.6–15.9)
LYMPH%: 6 % — AB (ref 14.0–49.7)
MCH: 27.5 pg (ref 25.1–34.0)
MCHC: 32.8 g/dL (ref 31.5–36.0)
MCV: 83.8 fL (ref 79.5–101.0)
MONO#: 0.6 10*3/uL (ref 0.1–0.9)
MONO%: 7.8 % (ref 0.0–14.0)
NEUT%: 85.6 % — ABNORMAL HIGH (ref 38.4–76.8)
NEUTROS ABS: 6.7 10*3/uL — AB (ref 1.5–6.5)
PLATELETS: 213 10*3/uL (ref 145–400)
RBC: 3.88 10*6/uL (ref 3.70–5.45)
RDW: 17.5 % — ABNORMAL HIGH (ref 11.2–14.5)
WBC: 7.8 10*3/uL (ref 3.9–10.3)
lymph#: 0.5 10*3/uL — ABNORMAL LOW (ref 0.9–3.3)

## 2015-03-31 LAB — COMPREHENSIVE METABOLIC PANEL (CC13)
ALT: 9 U/L (ref 0–55)
AST: 13 U/L (ref 5–34)
Albumin: 1.6 g/dL — ABNORMAL LOW (ref 3.5–5.0)
Alkaline Phosphatase: 148 U/L (ref 40–150)
Anion Gap: 9 mEq/L (ref 3–11)
BILIRUBIN TOTAL: 0.5 mg/dL (ref 0.20–1.20)
BUN: 67.8 mg/dL — ABNORMAL HIGH (ref 7.0–26.0)
CO2: 18 meq/L — AB (ref 22–29)
CREATININE: 2.4 mg/dL — AB (ref 0.6–1.1)
Calcium: 7.6 mg/dL — ABNORMAL LOW (ref 8.4–10.4)
Chloride: 108 mEq/L (ref 98–109)
EGFR: 18 mL/min/{1.73_m2} — ABNORMAL LOW (ref >90)
GLUCOSE: 81 mg/dL (ref 70–140)
Potassium: 4.1 mEq/L (ref 3.5–5.1)
SODIUM: 135 meq/L — AB (ref 136–145)
TOTAL PROTEIN: 4.8 g/dL — AB (ref 6.4–8.3)

## 2015-03-31 MED ORDER — HEPARIN SOD (PORK) LOCK FLUSH 100 UNIT/ML IV SOLN
500.0000 [IU] | Freq: Once | INTRAVENOUS | Status: AC
  Administered 2015-03-31: 500 [IU]
  Filled 2015-03-31: qty 5

## 2015-03-31 MED ORDER — SODIUM CHLORIDE 0.9 % IJ SOLN
10.0000 mL | Freq: Once | INTRAMUSCULAR | Status: AC
  Administered 2015-03-31: 10 mL
  Filled 2015-03-31: qty 10

## 2015-04-03 ENCOUNTER — Encounter: Payer: Self-pay | Admitting: Cardiovascular Disease

## 2015-04-03 ENCOUNTER — Ambulatory Visit (INDEPENDENT_AMBULATORY_CARE_PROVIDER_SITE_OTHER): Payer: Medicare Other | Admitting: Cardiovascular Disease

## 2015-04-03 ENCOUNTER — Other Ambulatory Visit: Payer: Self-pay | Admitting: Internal Medicine

## 2015-04-03 VITALS — BP 92/50 | HR 82 | Ht 68.0 in

## 2015-04-03 DIAGNOSIS — N184 Chronic kidney disease, stage 4 (severe): Secondary | ICD-10-CM | POA: Diagnosis not present

## 2015-04-03 DIAGNOSIS — I129 Hypertensive chronic kidney disease with stage 1 through stage 4 chronic kidney disease, or unspecified chronic kidney disease: Secondary | ICD-10-CM

## 2015-04-03 MED ORDER — HYDROCODONE-ACETAMINOPHEN 5-325 MG PO TABS: 1.0000 | ORAL_TABLET | Freq: Four times a day (QID) | ORAL | Status: DC | PRN

## 2015-04-03 NOTE — Progress Notes (Signed)
Cardiology Office Note Date:  04/03/2015   ID:  [name], DOB 10-May-1934, MRN [id number]  PCP:  Wende Neighbors, MD  Cardiologist:  Sherren Mocha, MD    Chief Complaint  Patient presents with  . Fatigue    History of Present Illness: [name] is a 81 y.o. female who presents for follow-up evaluation. The patient has coronary artery disease and she has undergone coronary stenting in 2007 when she was treated with a bare-metal stent in the right coronary artery. She also has had difficult to control hypertension and chronic kidney disease.  The patient has been diagnosed with recurrence of non-small cell lung cancer initially diagnosed in 2007. She is now undergoing chemotherapy. She's had increasing fatigue and weakness. She has come off of blood pressure medication with the exception of Toprol-XL because of low blood pressures. She was diagnosed with a large right paraspinous mass with osseous invasion. She complains of pain in her right leg. She denies chest pain or shortness of breath at rest. She is in a wheelchair today.  Past Medical History  Diagnosis Date  . Hypertension   . Hypercholesterolemia   . CKD (chronic kidney disease)   . Coronary artery disease     a.  LHC (06/04/05): LHC done in [place] with high grade RCA => s/p BMS to RCA;  b.  Nuclear (09/14/09): Lexiscan; Inf infarct with mild peri-infarct ishemia, EF 52%; Low Risk.  [place] GERD (gastroesophageal reflux disease)   . Chronic anemia   . Ischemic cardiomyopathy     a. Echo (07/26/13): Mild LVH, EF 35-40%, diff HK, inf AK, Gr 2 DD, Tr AI, mildly dilated Ao root, MAC, mild MR, mild LAE, mod reduced RVSF.  . Non-small cell carcinoma of lung ([place])     Stage IV    Past Surgical History  Procedure Laterality Date  . Hematoma evacuation  December 2006    groin  . Appendectomy    . Cataract extraction    . Hemorroidectomy    . Laminectomy      Current Outpatient Prescriptions  Medication Sig  Dispense Refill  . diphenhydramine-acetaminophen (TYLENOL PM) 25-500 MG TABS tablet Take 0.5-1 tablets by mouth at bedtime as needed (pain/sleep).    . diphenoxylate-atropine (LOMOTIL) 2.5-0.025 MG per tablet Take 1 tablet by mouth 4 (four) times daily as needed for diarrhea or loose stools. 30 tablet 0  . HYDROcodone-acetaminophen (NORCO/VICODIN) 5-325 MG tablet Take 1 tablet by mouth every 6 (six) hours as needed for moderate pain. Max APAP 3gm/24 hrs from all sources 30 tablet 0  . lidocaine-prilocaine (EMLA) cream Apply 1 application topically as needed. 30 g 2  . pantoprazole (PROTONIX) 40 MG tablet TAKE ONE TABLET BY MOUTH ONCE DAILY. 90 tablet 0  . potassium chloride SA (K-DUR,KLOR-CON) 20 MEQ tablet Take 1 tablet (20 mEq total) by mouth daily. 5 tablet 0  . SANTYL ointment Apply 1 application topically at bedtime as needed. For bed sores    . enoxaparin (LOVENOX) 80 MG/0.8ML injection INJECT 1 SYRINGE SUBCUTANEOUS ONCE DAILY. 24 mL 0   No current facility-administered medications for this visit.    Allergies:   Amlodipine; Ciprofloxacin; Mirtazapine; Statins; Aspirin; Cephalexin; Hydralazine; Iron; Lorazepam; Sulfonamide derivatives; Penicillins; Shellfish allergy; and Tape   Social History:  The patient  reports that she has quit smoking. Her smoking use included Cigarettes. She has never used smokeless tobacco. She reports that she does not drink alcohol or use illicit drugs.   Family History:  The patient's  family history includes Heart failure in her mother; Lung cancer in her brother and sister; Stomach cancer in her brother. There is no history of Heart attack or Stroke.   ROS:  Please see the history of present illness.  Otherwise, review of systems is positive for weight loss, decreased appetite, leg swelling, hearing loss, diarrhea, easy bruising, leg pain, nausea, vomiting.  All other systems are reviewed and negative.   PHYSICAL EXAM: VS:  BP 92/50 mmHg  Pulse 82  Ht '5\' 8"'$   (1.727 m)  SpO2 99% , BMI There is no weight on file to calculate BMI. GEN: chronically ill-appearing elderly woman, in no acute distress HEENT: normal Neck: no JVD, no masses.  Cardiac: RRR without murmur or gallop                Respiratory:  clear to auscultation bilaterally, normal work of breathing GI: soft, nontender, nondistended, + BS MS: no deformity or atrophy Ext: 2+ right pretibial edema Skin: warm and dry, no rash Neuro:  Strength and sensation are intact Psych: euthymic mood, full affect  EKG:  EKG is not ordered today.  Recent Labs: 03/20/2015: Magnesium 1.9 03/31/2015: ALT 9; BUN 67.8*; Creatinine 2.4*; HGB 10.7*; Platelets 213; Potassium 4.1; Sodium 135*   Lipid Panel     Component Value Date/Time   CHOL 248* 07/29/2014 0650   TRIG 179* 07/29/2014 0650   HDL 32* 07/29/2014 0650   CHOLHDL 7.8 07/29/2014 0650   VLDL 36 07/29/2014 0650   LDLCALC 180* 07/29/2014 0650   LDLDIRECT 209.4 05/19/2008 1024      Wt Readings from Last 3 Encounters:  03/17/15 109 lb 5.6 oz (49.6 kg)  02/16/15 120 lb (54.432 kg)  01/25/15 154 lb (69.854 kg)    ASSESSMENT AND PLAN: 1.  CAD, native vessel, without angina 2. Essential hypertension with CKD 3. Chronic kidney disease stage IV 4. Non-small cell lung cancer, metastatic  The patient's primary issue now relates to her lung cancer. She's had marked decline since I have last seen her. Her blood pressure is running too low, and I have given her instructions to wean off of metoprolol succinate. She will take 12.5 mg for 3 days then stop this medicine. She is written a prescription for Vicodin 5/325 mg to be used as needed for pain every 6 hours, #30 with no refills. I asked her to obtain this from either her oncologist or primary physician in the future, but she requested a prescription today she is in significant pain in her leg. I will see her back in 4 months.  Current medicines are reviewed with the patient today.  The patient  does not have concerns regarding medicines.  Labs/ tests ordered today include:  No orders of the defined types were placed in this encounter.    Disposition:   FU 4 months  Signed, Sherren Mocha, MD  04/03/2015 1:38 PM    [facility] [address], [place], [place]  [address] Phone: [phone]; Fax: [phone]

## 2015-04-03 NOTE — Patient Instructions (Signed)
Medication Instructions:  Your physician has recommended you make the following change in your medication:  1. DECREASE Metoprolol Succinate to one-half tablet by mouth for 3 days and then STOP 2. One time Vicodin refill given from Dr Burt Knack 3.  Please use over the counter stool softener for constipation--Colace   Labwork: No new orders.   Testing/Procedures: No new orders.   Follow-Up: Your physician recommends that you schedule a follow-up appointment in: 4 MONTHS With Dr Burt Knack   Any Other Special Instructions Will Be Listed Below (If Applicable).     If you need a refill on your cardiac medications before your next appointment, please call your pharmacy.

## 2015-04-04 ENCOUNTER — Telehealth: Payer: Self-pay | Admitting: Internal Medicine

## 2015-04-04 ENCOUNTER — Ambulatory Visit (HOSPITAL_BASED_OUTPATIENT_CLINIC_OR_DEPARTMENT_OTHER): Payer: Medicare Other | Admitting: Internal Medicine

## 2015-04-04 ENCOUNTER — Encounter: Payer: Self-pay | Admitting: Internal Medicine

## 2015-04-04 VITALS — BP 89/48 | HR 86 | Temp 97.8°F | Resp 17 | Ht 68.0 in | Wt 125.0 lb

## 2015-04-04 DIAGNOSIS — N179 Acute kidney failure, unspecified: Secondary | ICD-10-CM

## 2015-04-04 DIAGNOSIS — C3490 Malignant neoplasm of unspecified part of unspecified bronchus or lung: Secondary | ICD-10-CM | POA: Diagnosis not present

## 2015-04-04 DIAGNOSIS — N189 Chronic kidney disease, unspecified: Secondary | ICD-10-CM | POA: Diagnosis not present

## 2015-04-04 DIAGNOSIS — E46 Unspecified protein-calorie malnutrition: Secondary | ICD-10-CM | POA: Diagnosis not present

## 2015-04-04 DIAGNOSIS — E43 Unspecified severe protein-calorie malnutrition: Secondary | ICD-10-CM

## 2015-04-04 DIAGNOSIS — C7951 Secondary malignant neoplasm of bone: Secondary | ICD-10-CM | POA: Diagnosis not present

## 2015-04-04 DIAGNOSIS — R5382 Chronic fatigue, unspecified: Secondary | ICD-10-CM

## 2015-04-04 DIAGNOSIS — N183 Chronic kidney disease, stage 3 unspecified: Secondary | ICD-10-CM

## 2015-04-04 DIAGNOSIS — C3492 Malignant neoplasm of unspecified part of left bronchus or lung: Secondary | ICD-10-CM

## 2015-04-04 DIAGNOSIS — D638 Anemia in other chronic diseases classified elsewhere: Secondary | ICD-10-CM

## 2015-04-04 HISTORY — DX: Chronic fatigue, unspecified: R53.82

## 2015-04-04 MED ORDER — METHYLPREDNISOLONE 4 MG PO TBPK: ORAL_TABLET | ORAL | Status: DC

## 2015-04-04 NOTE — Telephone Encounter (Signed)
lvm for pt regarding to NOV appt.. °

## 2015-04-04 NOTE — Progress Notes (Signed)
[place] [phone]   [phone]  OFFICE PROGRESS NOTE  Wende Neighbors, MD [place] [address]  PRINCIPAL DIAGNOSIS: Stage IV non-small cell lung cancer diagnosed in March 2007, with disease recurrence in June 2016 with positive PDL 1 expression (50%).   PRIOR THERAPY:  1) Status post 6 cycles of systemic chemotherapy with carboplatin and docetaxel. Last dose was given January 16, 2006.  2) Palliative radiotherapy to the large right paraspinous mass with osseous invasion centered at L5 under the care of Dr. Valere Dross. 3) Systemic chemotherapy with carboplatin for AUC of 5 and paclitaxel 175 MG/M2 every 3 weeks with Neulasta support. First dose expected on 02/02/2015. Status post 2 cycles, last dose was given 02/23/2015 discontinued secondary to intolerance and mild disease progression.  CURRENT THERAPY: Ketruda 200 mg IV every 3 weeks. First dose 04/14/2015.  DISEASE STAGE: Stage IV non-small cell lung cancer diagnosed in March of 2007.  CHEMOTHERAPY INTENT: Palliative  CURRENT # OF CHEMOTHERAPY CYCLES: 2  CURRENT ANTIEMETICS: None  CURRENT SMOKING STATUS: Nonsmoker  ORAL CHEMOTHERAPY AND CONSENT: None  CURRENT BISPHOSPHONATES USE: None  LIVING WILL AND CODE STATUS: Full code  INTERVAL HISTORY: [name] 81 y.o. female returns to the clinic today for followup visit accompanied by her daughter and grandson. The patient has been off treatment since 02/23/2015 secondary to intolerance of her systemic chemotherapy was carboplatin and paclitaxel. She had significant fatigue and weakness as well as pancytopenia with her previous treatment. She felt much better after starting her on Medrol Dosepak. Her appetite is declining again and the patient continues to have significant malnutrition. She continues to have mild low back pain and she is currently on Vicodin by her primary care physician. She denied having any significant  chest pain, shortness of breath, cough or hemoptysis. She has no nausea or vomiting. She denied having any significant fever or chills. She had repeat CT scan of the chest, abdomen and pelvis recently and she is here for evaluation and discussion of her scan results.  MEDICAL HISTORY: Past Medical History  Diagnosis Date  . Hypertension   . Hypercholesterolemia   . CKD (chronic kidney disease)   . Coronary artery disease     a.  LHC (06/04/05): LHC done in [place] with high grade RCA => s/p BMS to RCA;  b.  Nuclear (09/14/09): Lexiscan; Inf infarct with mild peri-infarct ishemia, EF 52%; Low Risk.  Marland Kitchen GERD (gastroesophageal reflux disease)   . Chronic anemia   . Ischemic cardiomyopathy     a. Echo (07/26/13): Mild LVH, EF 35-40%, diff HK, inf AK, Gr 2 DD, Tr AI, mildly dilated Ao root, MAC, mild MR, mild LAE, mod reduced RVSF.  . Non-small cell carcinoma of lung (HCC)     Stage IV    ALLERGIES:  is allergic to amlodipine; ciprofloxacin; mirtazapine; statins; aspirin; cephalexin; hydralazine; iron; lorazepam; sulfonamide derivatives; penicillins; shellfish allergy; and tape.  MEDICATIONS:  Current Outpatient Prescriptions  Medication Sig Dispense Refill  . diphenhydramine-acetaminophen (TYLENOL PM) 25-500 MG TABS tablet Take 0.5-1 tablets by mouth at bedtime as needed (pain/sleep).    . diphenoxylate-atropine (LOMOTIL) 2.5-0.025 MG per tablet Take 1 tablet by mouth 4 (four) times daily as needed for diarrhea or loose stools. 30 tablet 0  . enoxaparin (LOVENOX) 80 MG/0.8ML injection INJECT 1 SYRINGE SUBCUTANEOUS ONCE DAILY. 24 mL 0  . HYDROcodone-acetaminophen (NORCO/VICODIN) 5-325 MG tablet Take 1 tablet by mouth every 6 (six) hours  as needed for moderate pain. Max APAP 3gm/24 hrs from all sources 30 tablet 0  . lidocaine-prilocaine (EMLA) cream Apply 1 application topically as needed. 30 g 2  . pantoprazole (PROTONIX) 40 MG tablet TAKE ONE TABLET BY MOUTH ONCE DAILY. 90 tablet 0  .  potassium chloride SA (K-DUR,KLOR-CON) 20 MEQ tablet Take 1 tablet (20 mEq total) by mouth daily. 5 tablet 0  . SANTYL ointment Apply 1 application topically at bedtime as needed. For bed sores     No current facility-administered medications for this visit.    SURGICAL HISTORY:  Past Surgical History  Procedure Laterality Date  . Hematoma evacuation  December 2006    groin  . Appendectomy    . Cataract extraction    . Hemorroidectomy    . Laminectomy      REVIEW OF SYSTEMS:  Constitutional: positive for anorexia, fatigue and weight loss Eyes: negative Ears, nose, mouth, throat, and face: negative Respiratory: negative Cardiovascular: negative Gastrointestinal: negative Genitourinary:negative Integument/breast: negative Hematologic/lymphatic: negative Musculoskeletal:positive for arthralgias, back pain and muscle weakness Neurological: negative Behavioral/Psych: negative Endocrine: negative Allergic/Immunologic: negative   PHYSICAL EXAMINATION: General appearance: alert, cooperative and no distress Head: Normocephalic, without obvious abnormality, atraumatic Neck: no adenopathy Lymph nodes: Cervical, supraclavicular, and axillary nodes normal. Resp: clear to auscultation bilaterally Back: symmetric, no curvature. ROM normal. No CVA tenderness., Decubitus ulcer in the lower back Cardio: regular rate and rhythm, S1, S2 normal, no murmur, click, rub or gallop GI: soft, non-tender; bowel sounds normal; no masses,  no organomegaly Extremities: extremities normal, atraumatic, no cyanosis or edema Neurologic: Alert and oriented X 3, normal strength and tone. Normal symmetric reflexes. Normal coordination and gait  ECOG PERFORMANCE STATUS: 2 - Symptomatic, <50% confined to bed  There were no vitals taken for this visit.  LABORATORY DATA: Lab Results  Component Value Date   WBC 7.8 03/31/2015   HGB 10.7* 03/31/2015   HCT 32.5* 03/31/2015   MCV 83.8 03/31/2015   PLT 213  03/31/2015      Chemistry      Component Value Date/Time   NA 135* 03/31/2015 1016   NA 137 03/20/2015 0455   NA 143 12/23/2011 0830   K 4.1 03/31/2015 1016   K 4.3 03/20/2015 0455   K 4.7 12/23/2011 0830   CL 112* 03/20/2015 0455   CL 104 06/19/2012 0958   CL 101 12/23/2011 0830   CO2 18* 03/31/2015 1016   CO2 20* 03/20/2015 0455   CO2 27 12/23/2011 0830   BUN 67.8* 03/31/2015 1016   BUN 53* 03/20/2015 0455   BUN 25* 12/23/2011 0830   CREATININE 2.4* 03/31/2015 1016   CREATININE 2.51* 03/20/2015 0455   CREATININE 2.09* 02/01/2014 1545      Component Value Date/Time   CALCIUM 7.6* 03/31/2015 1016   CALCIUM 7.9* 03/20/2015 0455   CALCIUM 9.0 12/23/2011 0830   ALKPHOS 148 03/31/2015 1016   ALKPHOS 100 03/17/2015 1700   ALKPHOS 76 12/23/2011 0830   AST 13 03/31/2015 1016   AST 11* 03/17/2015 1700   AST 16 12/23/2011 0830   ALT 9 03/31/2015 1016   ALT 6* 03/17/2015 1700   ALT 16 12/23/2011 0830   BILITOT 0.50 03/31/2015 1016   BILITOT 0.5 03/17/2015 1700   BILITOT 0.60 12/23/2011 0830       RADIOGRAPHIC STUDIES: Ct Abdomen Pelvis Wo Contrast  03/29/2015  CLINICAL DATA:  Subsequent treatment strategy for lung carcinoma. Last chemotherapy October 2016. EXAM: CT CHEST, ABDOMEN AND PELVIS WITHOUT CONTRAST  TECHNIQUE: Multidetector CT imaging of the chest, abdomen and pelvis was performed following the standard protocol without IV contrast. COMPARISON:  CT chest 01/02/2015 CT abdomen 01/03/2015 FINDINGS: CT CHEST FINDINGS Mediastinum/Nodes: No axillary or supraclavicular lymphadenopathy. No mediastinal hilar lymphadenopathy. Ascending aorta measures 40 mm. No pericardial fluid. Intimal calcification aorta noted. Lungs/Pleura: Bilateral pulmonary nodules again demonstrated. LEFT upper lobe nodule measures 12 mm on image 22, series 4 compared to 14 mm. LEFT lower lobe nodule measures 7 mm on image 55 decreased from 12 mm. Peripheral RIGHT upper lobe nodule along the pleural  surface measures 5 mm image 25 series 4 more prominent than 3 mm on prior. RIGHT upper lobe nodule measuring 3 mm is increased from 2 mm (image 26, series 4). Another RIGHT lower lobe nodule upper lobe nodule is stable 3 mm on image 39. CT ABDOMEN AND PELVIS FINDINGS Hepatobiliary: No focal hepatic lesion. No biliary duct dilatation. Gallbladder is normal. Common bile duct is normal. Pancreas: The duodenum all diverticulum adjacent head of the pancreas. No pancreatic duct dilatation or mass. Spleen: Normal spleen Adrenals/urinary tract: Adrenal glands are normal. There is a double-J ureteral stent on the RIGHT. Mixed density renal cortical lesions on the RIGHT are unchanged. Cortical thinning of the LEFT kidney. Bladder is normal. No evidence of obstruction. Stomach/Bowel: Stomach, small bowel, appendix, and cecum are normal. The colon and rectosigmoid colon are normal. Vascular/Lymphatic: Abdominal aorta is normal caliber with atherosclerotic calcification. There is no retroperitoneal or periportal lymphadenopathy. No pelvic lymphadenopathy. Improvement in LEFT periaortic adenopathy seen on comparison exam Reproductive: Post hysterectomy Musculoskeletal: The destructive mass centered within the RIGHT sacral ala measures 5.1 x 7.0 cm decreased from 5.6 x 8.5 cm. Involvement of the L5 vertebral body on the RIGHT with soft tissue expansion measuring 3.5 x 3.1 cm on image 85, series 2 is decreased from 5.0 x 3.8 cm. Other: No free fluid. IMPRESSION: Chest Impression: 1. Overall stable pulmonary metastasis. Some nodules are increased in size and some decreased in size. 2. No evidence of new pulmonary nodules. 3. Stable dilatation of the ascending aorta at 4 cm. Abdomen / Pelvis Impression: 1. Interval decrease in size of the soft tissue mass of the metastatic lesions in the RIGHT sacrum and L5 vertebral body. 2. Interval decrease in size of LEFT periaortic adenopathy. 3. No evidence disease progression. 4. RIGHT  ureteral stent in place without hydronephrosis Electronically Signed   By: Suzy Bouchard M.D.   On: 03/29/2015 09:46   Dg Chest 2 View  03/17/2015  CLINICAL DATA:  Dyspnea. History of hypertension, coronary artery disease and non-small-cell carcinoma of the lungs. EXAM: CHEST  2 VIEW COMPARISON:  11/24/2014 FINDINGS: Cardiac silhouette is mildly enlarged. No mediastinal or hilar masses or evidence of adenopathy. Focal nodular opacity in the left upper lobe, which may be scarring, is stable. There is no lung consolidation and no evidence of edema. Lungs are hyperexpanded. No pleural effusion or pneumothorax. Right anterior chest wall Port-A-Cath has its catheter tip in the lower superior vena cava. Bony thorax is demineralized but grossly intact. IMPRESSION: 1. No acute cardiopulmonary disease. 2. COPD. Electronically Signed   By: Lajean Manes M.D.   On: 03/17/2015 19:36   Dg Wrist Complete Right  03/18/2015  CLINICAL DATA:  Right wrist pain for 3 days.  No known injury. EXAM: RIGHT WRIST - COMPLETE 3+ VIEW COMPARISON:  None. FINDINGS: No fracture or dislocation. There is joint space narrowing, subchondral sclerosis and marginal osteophytes at the trapezium, first metacarpal  articulation consistent with osteoarthritis. Remaining joints are normally spaced and aligned. Bones are demineralized. Soft tissues are unremarkable. IMPRESSION: 1. No fracture or acute finding. 2. Osteoarthritis at the first carpal metacarpal articulation. Electronically Signed   By: Lajean Manes M.D.   On: 03/18/2015 15:42   Ct Chest Wo Contrast  03/29/2015  CLINICAL DATA:  Subsequent treatment strategy for lung carcinoma. Last chemotherapy October 2016. EXAM: CT CHEST, ABDOMEN AND PELVIS WITHOUT CONTRAST TECHNIQUE: Multidetector CT imaging of the chest, abdomen and pelvis was performed following the standard protocol without IV contrast. COMPARISON:  CT chest 01/02/2015 CT abdomen 01/03/2015 FINDINGS: CT CHEST FINDINGS  Mediastinum/Nodes: No axillary or supraclavicular lymphadenopathy. No mediastinal hilar lymphadenopathy. Ascending aorta measures 40 mm. No pericardial fluid. Intimal calcification aorta noted. Lungs/Pleura: Bilateral pulmonary nodules again demonstrated. LEFT upper lobe nodule measures 12 mm on image 22, series 4 compared to 14 mm. LEFT lower lobe nodule measures 7 mm on image 55 decreased from 12 mm. Peripheral RIGHT upper lobe nodule along the pleural surface measures 5 mm image 25 series 4 more prominent than 3 mm on prior. RIGHT upper lobe nodule measuring 3 mm is increased from 2 mm (image 26, series 4). Another RIGHT lower lobe nodule upper lobe nodule is stable 3 mm on image 39. CT ABDOMEN AND PELVIS FINDINGS Hepatobiliary: No focal hepatic lesion. No biliary duct dilatation. Gallbladder is normal. Common bile duct is normal. Pancreas: The duodenum all diverticulum adjacent head of the pancreas. No pancreatic duct dilatation or mass. Spleen: Normal spleen Adrenals/urinary tract: Adrenal glands are normal. There is a double-J ureteral stent on the RIGHT. Mixed density renal cortical lesions on the RIGHT are unchanged. Cortical thinning of the LEFT kidney. Bladder is normal. No evidence of obstruction. Stomach/Bowel: Stomach, small bowel, appendix, and cecum are normal. The colon and rectosigmoid colon are normal. Vascular/Lymphatic: Abdominal aorta is normal caliber with atherosclerotic calcification. There is no retroperitoneal or periportal lymphadenopathy. No pelvic lymphadenopathy. Improvement in LEFT periaortic adenopathy seen on comparison exam Reproductive: Post hysterectomy Musculoskeletal: The destructive mass centered within the RIGHT sacral ala measures 5.1 x 7.0 cm decreased from 5.6 x 8.5 cm. Involvement of the L5 vertebral body on the RIGHT with soft tissue expansion measuring 3.5 x 3.1 cm on image 85, series 2 is decreased from 5.0 x 3.8 cm. Other: No free fluid. IMPRESSION: Chest Impression:  1. Overall stable pulmonary metastasis. Some nodules are increased in size and some decreased in size. 2. No evidence of new pulmonary nodules. 3. Stable dilatation of the ascending aorta at 4 cm. Abdomen / Pelvis Impression: 1. Interval decrease in size of the soft tissue mass of the metastatic lesions in the RIGHT sacrum and L5 vertebral body. 2. Interval decrease in size of LEFT periaortic adenopathy. 3. No evidence disease progression. 4. RIGHT ureteral stent in place without hydronephrosis Electronically Signed   By: Suzy Bouchard M.D.   On: 03/29/2015 09:46   Ir Fluoro Guide Cv Line Right  03/07/2015  INDICATION: History of lung cancer. In need of durable venous access for chemotherapy administration. EXAM: IMPLANTED PORT A CATH PLACEMENT WITH ULTRASOUND AND FLUOROSCOPIC GUIDANCE COMPARISON:  Chest CT - 01/02/2015 MEDICATIONS: Vancomycin 1 g IV; The antibiotic was administered within an appropriate time interval prior to skin puncture. ANESTHESIA/SEDATION: Conscious sedation was achieved with intravenous Versed and Fentanyl. Total Moderate Sedation Time 20  minutes. CONTRAST:  None FLUOROSCOPY TIME:  30 seconds (1.5 mGy) COMPLICATIONS: SIR level A: No therapy, no consequence. The procedure was complicated  by development of a skin tear adjacent to the [facility] a Catheter reservoir site. PROCEDURE: The procedure, risks, benefits, and alternatives were explained to the patient. Questions regarding the procedure were encouraged and answered. The patient understands and consents to the procedure. The right neck and chest were prepped with chlorhexidine in a sterile fashion, and a sterile drape was applied covering the operative field. Maximum barrier sterile technique with sterile gowns and gloves were used for the procedure. A timeout was performed prior to the initiation of the procedure. Local anesthesia was provided with 1% lidocaine with epinephrine. After creating a small venotomy incision, a micropuncture  kit was utilized to access the internal jugular vein under direct, real-time ultrasound guidance. Ultrasound image documentation was performed. The microwire was kinked to measure appropriate catheter length. A subcutaneous port pocket was then created along the upper chest wall utilizing a combination of sharp and blunt dissection. Patient developed a fairly sizable skin tear adjacent to the cranial medial aspect of the Port reservoir incision site due to her extremely frail skin. The pocket was irrigated with sterile saline. A single lumen thin power injectable port was chosen for placement. The 8 Fr catheter was tunneled from the port pocket site to the venotomy incision. The port was placed in the pocket. The external catheter was trimmed to appropriate length. At the venotomy, an 8 Fr peel-away sheath was placed over a guidewire under fluoroscopic guidance. The catheter was then placed through the sheath and the sheath was removed. Final catheter positioning was confirmed and documented with a fluoroscopic spot radiograph. The port was accessed with a Huber needle, aspirated and flushed with heparinized saline. The port pocket incision was closed with interrupted 2-0 Vicryl suture. Skin apposition was performed at both the port pocket incision and venotomy sites with Dermabond and Steri-strips. Dressings were placed. The patient tolerated the procedure well without immediate post procedural complication. FINDINGS: After catheter placement, the tip lies within the superior cavoatrial junction. The catheter aspirates and flushes normally and is ready for immediate use. IMPRESSION: 1. Successful placement of a right internal jugular approach power injectable Port-A-Cath. The catheter is ready for immediate use. 2. The procedure was complicated by development of a fairly sizable skin tear adjacent to the cranial-medial aspect of the Port reservoir incision site due to her extremely frail skin. Electronically Signed    By: Sandi Mariscal M.D.   On: 03/07/2015 17:09   Ir US Guide Vasc Access Right  03/07/2015  INDICATION: History of lung cancer. In need of durable venous access for chemotherapy administration. EXAM: IMPLANTED PORT A CATH PLACEMENT WITH ULTRASOUND AND FLUOROSCOPIC GUIDANCE COMPARISON:  Chest CT - 01/02/2015 MEDICATIONS: Vancomycin 1 g IV; The antibiotic was administered within an appropriate time interval prior to skin puncture. ANESTHESIA/SEDATION: Conscious sedation was achieved with intravenous Versed and Fentanyl. Total Moderate Sedation Time 20  minutes. CONTRAST:  None FLUOROSCOPY TIME:  30 seconds (1.5 mGy) COMPLICATIONS: SIR level A: No therapy, no consequence. The procedure was complicated by development of a skin tear adjacent to the [facility] a Catheter reservoir site. PROCEDURE: The procedure, risks, benefits, and alternatives were explained to the patient. Questions regarding the procedure were encouraged and answered. The patient understands and consents to the procedure. The right neck and chest were prepped with chlorhexidine in a sterile fashion, and a sterile drape was applied covering the operative field. Maximum barrier sterile technique with sterile gowns and gloves were used for the procedure. A timeout was performed prior to the  initiation of the procedure. Local anesthesia was provided with 1% lidocaine with epinephrine. After creating a small venotomy incision, a micropuncture kit was utilized to access the internal jugular vein under direct, real-time ultrasound guidance. Ultrasound image documentation was performed. The microwire was kinked to measure appropriate catheter length. A subcutaneous port pocket was then created along the upper chest wall utilizing a combination of sharp and blunt dissection. Patient developed a fairly sizable skin tear adjacent to the cranial medial aspect of the Port reservoir incision site due to her extremely frail skin. The pocket was irrigated with sterile  saline. A single lumen thin power injectable port was chosen for placement. The 8 Fr catheter was tunneled from the port pocket site to the venotomy incision. The port was placed in the pocket. The external catheter was trimmed to appropriate length. At the venotomy, an 8 Fr peel-away sheath was placed over a guidewire under fluoroscopic guidance. The catheter was then placed through the sheath and the sheath was removed. Final catheter positioning was confirmed and documented with a fluoroscopic spot radiograph. The port was accessed with a Huber needle, aspirated and flushed with heparinized saline. The port pocket incision was closed with interrupted 2-0 Vicryl suture. Skin apposition was performed at both the port pocket incision and venotomy sites with Dermabond and Steri-strips. Dressings were placed. The patient tolerated the procedure well without immediate post procedural complication. FINDINGS: After catheter placement, the tip lies within the superior cavoatrial junction. The catheter aspirates and flushes normally and is ready for immediate use. IMPRESSION: 1. Successful placement of a right internal jugular approach power injectable Port-A-Cath. The catheter is ready for immediate use. 2. The procedure was complicated by development of a fairly sizable skin tear adjacent to the cranial-medial aspect of the Port reservoir incision site due to her extremely frail skin. Electronically Signed   By: Sandi Mariscal M.D.   On: 03/07/2015 17:09   Dg Hand Complete Right  03/18/2015  CLINICAL DATA:  Right hand pain for 3 days, no known injury EXAM: RIGHT HAND - COMPLETE 3+ VIEW COMPARISON:  None. FINDINGS: Three views of the right hand submitted. No acute fracture or subluxation. Narrowing of radiocarpal joint space. Significant degenerative changes first carpometacarpal joint. Diffuse osteopenia. IMPRESSION: No acute fracture or subluxation. Degenerative changes as described above. Electronically Signed   By:  Lahoma Crocker M.D.   On: 03/18/2015 15:41    ASSESSMENT AND PLAN: This is a very pleasant 81 years old white female with history of stage IV non-small cell lung cancer diagnosed in March of 2007 status post 6 cycles of systemic chemotherapy with carboplatin and docetaxel and has been observation since that time with no evidence for disease progression for almost 9 years.  She was found recently to have evidence for disease recurrence with large lumbar paraspinous mass with osseous invasion and the core biopsy was consistent with metastatic squamous cell carcinoma.  She is status post palliative radiotherapy to the soft tissue mass in the lumbar area. The patient underwent systemic chemotherapy with carboplatin and paclitaxel is status post 2 cycle but she has rough time tolerating her systemic chemotherapy with significant pancytopenia, fatigue and weakness.  Her recent CT scan of the chest, abdomen and pelvis showed improvement in the soft tissue mass in the lumbar area but there was some mild increase in some of the pulmonary nodules. I discussed the scan results with the patient and her family. I recommended for her to discontinue her current treatment with systemic  chemotherapy with carboplatin and paclitaxel secondary to intolerance and mild disease progression. The patient has 50%  PDL 1 expression on her tumor tissue. I recommended for her treatment with Ketruda (pembrolizumab) at fixed dose of 200 mg IV every 3 weeks. I discussed with the patient adverse effect of this treatment including but not limited to immune mediated skin rash, diarrhea, liver, renal, thyroid and other endocrine dysfunction in addition to pneumonitis. The patient and her family are interested in proceeding with immunotherapy.  She is expected to start the first dose of this treatment on 04/14/2015. She will come back for follow-up visit in 4 weeks with the start of cycle #2. For the lack of appetite, I started the patient on  Medrol Dosepak to be completed before starting the first dose of immunotherapy. For the malnutrition, I referred the patient to the dietitian at the [facility] for evaluation of her condition. For pain management, the patient will continue on Vicodin as prescribed by her primary care physician. The patient was advised to call immediately if she has any concerning symptoms in the interval.  All questions were answered. The patient knows to call the clinic with any problems, questions or concerns. We can certainly see the patient much sooner if necessary.  Disclaimer: This note was dictated with voice recognition software. Similar sounding words can inadvertently be transcribed and may not be corrected upon review.

## 2015-04-07 ENCOUNTER — Encounter: Payer: Medicare Other | Admitting: Nutrition

## 2015-04-07 ENCOUNTER — Ambulatory Visit: Payer: Medicare Other

## 2015-04-07 ENCOUNTER — Other Ambulatory Visit: Payer: Medicare Other

## 2015-04-07 ENCOUNTER — Ambulatory Visit: Payer: Medicare Other | Admitting: Physician Assistant

## 2015-04-13 ENCOUNTER — Other Ambulatory Visit: Payer: Self-pay | Admitting: *Deleted

## 2015-04-14 ENCOUNTER — Ambulatory Visit (HOSPITAL_BASED_OUTPATIENT_CLINIC_OR_DEPARTMENT_OTHER): Payer: Medicare Other

## 2015-04-14 ENCOUNTER — Other Ambulatory Visit (HOSPITAL_BASED_OUTPATIENT_CLINIC_OR_DEPARTMENT_OTHER): Payer: Medicare Other

## 2015-04-14 ENCOUNTER — Ambulatory Visit: Payer: Medicare Other

## 2015-04-14 VITALS — BP 120/72 | HR 99 | Temp 97.1°F | Resp 16

## 2015-04-14 DIAGNOSIS — C3490 Malignant neoplasm of unspecified part of unspecified bronchus or lung: Secondary | ICD-10-CM | POA: Diagnosis not present

## 2015-04-14 DIAGNOSIS — N183 Chronic kidney disease, stage 3 unspecified: Secondary | ICD-10-CM

## 2015-04-14 DIAGNOSIS — C3492 Malignant neoplasm of unspecified part of left bronchus or lung: Secondary | ICD-10-CM

## 2015-04-14 DIAGNOSIS — Z5112 Encounter for antineoplastic immunotherapy: Secondary | ICD-10-CM | POA: Diagnosis not present

## 2015-04-14 DIAGNOSIS — R5382 Chronic fatigue, unspecified: Secondary | ICD-10-CM

## 2015-04-14 DIAGNOSIS — Z79899 Other long term (current) drug therapy: Secondary | ICD-10-CM

## 2015-04-14 DIAGNOSIS — E43 Unspecified severe protein-calorie malnutrition: Secondary | ICD-10-CM

## 2015-04-14 DIAGNOSIS — C7951 Secondary malignant neoplasm of bone: Secondary | ICD-10-CM | POA: Diagnosis not present

## 2015-04-14 DIAGNOSIS — Z95828 Presence of other vascular implants and grafts: Secondary | ICD-10-CM

## 2015-04-14 DIAGNOSIS — N179 Acute kidney failure, unspecified: Secondary | ICD-10-CM

## 2015-04-14 DIAGNOSIS — D638 Anemia in other chronic diseases classified elsewhere: Secondary | ICD-10-CM

## 2015-04-14 LAB — CBC WITH DIFFERENTIAL/PLATELET
BASO%: 0.4 % (ref 0.0–2.0)
BASOS ABS: 0 10*3/uL (ref 0.0–0.1)
EOS ABS: 0.1 10*3/uL (ref 0.0–0.5)
EOS%: 1.2 % (ref 0.0–7.0)
HEMATOCRIT: 27.7 % — AB (ref 34.8–46.6)
HGB: 9 g/dL — ABNORMAL LOW (ref 11.6–15.9)
LYMPH%: 7 % — AB (ref 14.0–49.7)
MCH: 27.6 pg (ref 25.1–34.0)
MCHC: 32.5 g/dL (ref 31.5–36.0)
MCV: 85 fL (ref 79.5–101.0)
MONO#: 0.6 10*3/uL (ref 0.1–0.9)
MONO%: 12.7 % (ref 0.0–14.0)
NEUT#: 3.9 10*3/uL (ref 1.5–6.5)
NEUT%: 78.7 % — AB (ref 38.4–76.8)
PLATELETS: 196 10*3/uL (ref 145–400)
RBC: 3.26 10*6/uL — ABNORMAL LOW (ref 3.70–5.45)
RDW: 17.2 % — ABNORMAL HIGH (ref 11.2–14.5)
WBC: 5 10*3/uL (ref 3.9–10.3)
lymph#: 0.4 10*3/uL — ABNORMAL LOW (ref 0.9–3.3)
nRBC: 0 % (ref 0–0)

## 2015-04-14 LAB — COMPREHENSIVE METABOLIC PANEL (CC13)
ALBUMIN: 1.6 g/dL — AB (ref 3.5–5.0)
ALT: <9 U/L (ref 0–55)
AST: 10 U/L (ref 5–34)
Alkaline Phosphatase: 117 U/L (ref 40–150)
Anion Gap: 10 mEq/L (ref 3–11)
BUN: 29.9 mg/dL — ABNORMAL HIGH (ref 7.0–26.0)
CHLORIDE: 104 meq/L (ref 98–109)
CO2: 20 meq/L — AB (ref 22–29)
Calcium: 7.6 mg/dL — ABNORMAL LOW (ref 8.4–10.4)
Creatinine: 1.8 mg/dL — ABNORMAL HIGH (ref 0.6–1.1)
EGFR: 26 mL/min/{1.73_m2} — AB (ref >90)
GLUCOSE: 75 mg/dL (ref 70–140)
POTASSIUM: 4.3 meq/L (ref 3.5–5.1)
Sodium: 134 mEq/L — ABNORMAL LOW (ref 136–145)
TOTAL PROTEIN: 4.9 g/dL — AB (ref 6.4–8.3)
Total Bilirubin: 0.58 mg/dL (ref 0.20–1.20)

## 2015-04-14 LAB — TSH CHCC: TSH: 3.904 m(IU)/L (ref 0.308–3.960)

## 2015-04-14 MED ORDER — SODIUM CHLORIDE 0.9 % IJ SOLN
10.0000 mL | INTRAMUSCULAR | Status: DC | PRN
  Administered 2015-04-14: 10 mL via INTRAVENOUS
  Filled 2015-04-14: qty 10

## 2015-04-14 MED ORDER — SODIUM CHLORIDE 0.9 % IJ SOLN
10.0000 mL | INTRAMUSCULAR | Status: DC | PRN
  Administered 2015-04-14: 10 mL
  Filled 2015-04-14: qty 10

## 2015-04-14 MED ORDER — SODIUM CHLORIDE 0.9 % IV SOLN
200.0000 mg | Freq: Once | INTRAVENOUS | Status: AC
  Administered 2015-04-14: 200 mg via INTRAVENOUS
  Filled 2015-04-14: qty 8

## 2015-04-14 MED ORDER — HEPARIN SOD (PORK) LOCK FLUSH 100 UNIT/ML IV SOLN
500.0000 [IU] | Freq: Once | INTRAVENOUS | Status: AC | PRN
  Administered 2015-04-14: 500 [IU]
  Filled 2015-04-14: qty 5

## 2015-04-14 MED ORDER — SODIUM CHLORIDE 0.9 % IV SOLN
Freq: Once | INTRAVENOUS | Status: AC
  Administered 2015-04-14: 10:00:00 via INTRAVENOUS

## 2015-04-14 MED ORDER — PROCHLORPERAZINE MALEATE 10 MG PO TABS
ORAL_TABLET | ORAL | Status: AC
  Filled 2015-04-14: qty 1

## 2015-04-14 MED ORDER — PROCHLORPERAZINE MALEATE 10 MG PO TABS
10.0000 mg | ORAL_TABLET | Freq: Once | ORAL | Status: AC
  Administered 2015-04-14: 10 mg via ORAL

## 2015-04-14 NOTE — Patient Instructions (Addendum)
[facility] Discharge Instructions for Patients Receiving Chemotherapy  Today you received the following chemotherapy agents :  Keytruda.  To help prevent nausea and vomiting after your treatment, we encourage you to take your nausea medication as prescribed.   If you develop nausea and vomiting that is not controlled by your nausea medication, call the clinic.   BELOW ARE SYMPTOMS THAT SHOULD BE REPORTED IMMEDIATELY:  *FEVER GREATER THAN 100.5 F  *CHILLS WITH OR WITHOUT FEVER  NAUSEA AND VOMITING THAT IS NOT CONTROLLED WITH YOUR NAUSEA MEDICATION  *UNUSUAL SHORTNESS OF BREATH  *UNUSUAL BRUISING OR BLEEDING  TENDERNESS IN MOUTH AND THROAT WITH OR WITHOUT PRESENCE OF ULCERS  *URINARY PROBLEMS  *BOWEL PROBLEMS  UNUSUAL RASH Items with * indicate a potential emergency and should be followed up as soon as possible.  Feel free to call the clinic you have any questions or concerns. The clinic phone number is [phone].  Please show the [facility] at check-in to the Emergency Department and triage nurse.  Pembrolizumab injection What is this medicine? PEMBROLIZUMAB (pem broe liz ue mab) is a monoclonal antibody. It is used to treat melanoma and non-small cell lung cancer. This medicine may be used for other purposes; ask your health care provider or pharmacist if you have questions. What should I tell my health care provider before I take this medicine? They need to know if you have any of these conditions: -diabetes -immune system problems -inflammatory bowel disease -liver disease -lung or breathing disease -lupus -an unusual or allergic reaction to pembrolizumab, other medicines, foods, dyes, or preservatives -pregnant or trying to get pregnant -breast-feeding How should I use this medicine? This medicine is for infusion into a vein. It is given by a health care professional in a hospital or clinic setting. A special MedGuide will be given to  you before each treatment. Be sure to read this information carefully each time. Talk to your pediatrician regarding the use of this medicine in children. Special care may be needed. Overdosage: If you think you have taken too much of this medicine contact a poison control center or emergency room at once. NOTE: This medicine is only for you. Do not share this medicine with others. What if I miss a dose? It is important not to miss your dose. Call your doctor or health care professional if you are unable to keep an appointment. What may interact with this medicine? Interactions have not been studied. Give your health care provider a list of all the medicines, herbs, non-prescription drugs, or dietary supplements you use. Also tell them if you smoke, drink alcohol, or use illegal drugs. Some items may interact with your medicine. This list may not describe all possible interactions. Give your health care provider a list of all the medicines, herbs, non-prescription drugs, or dietary supplements you use. Also tell them if you smoke, drink alcohol, or use illegal drugs. Some items may interact with your medicine. What should I watch for while using this medicine? Your condition will be monitored carefully while you are receiving this medicine. You may need blood work done while you are taking this medicine. Do not become pregnant while taking this medicine or for 4 months after stopping it. Women should inform their doctor if they wish to become pregnant or think they might be pregnant. There is a potential for serious side effects to an unborn child. Talk to your health care professional or pharmacist for more information. Do not breast-feed an  infant while taking this medicine or for 4 months after the last dose. What side effects may I notice from receiving this medicine? Side effects that you should report to your doctor or health care professional as soon as possible: -allergic reactions like skin  rash, itching or hives, swelling of the face, lips, or tongue -bloody or black, tarry stools -breathing problems -change in the amount of urine -changes in vision -chest pain -chills -dark urine -dizziness or feeling faint or lightheaded -fast or irregular heartbeat -fever -flushing -hair loss -muscle pain -muscle weakness -persistent headache -signs and symptoms of high blood sugar such as dizziness; dry mouth; dry skin; fruity breath; nausea; stomach pain; increased hunger or thirst; increased urination -signs and symptoms of liver injury like dark urine, light-colored stools, loss of appetite, nausea, right upper belly pain, yellowing of the eyes or skin -stomach pain -weight loss Side effects that usually do not require medical attention (Report these to your doctor or health care professional if they continue or are bothersome.):constipation -cough -diarrhea -joint pain -tiredness This list may not describe all possible side effects. Call your doctor for medical advice about side effects. You may report side effects to FDA at 1-800-FDA-1088. Where should I keep my medicine? This drug is given in a hospital or clinic and will not be stored at home. NOTE: This sheet is a summary. It may not cover all possible information. If you have questions about this medicine, talk to your doctor, pharmacist, or health care provider.    2016, Elsevier/Gold Standard. (2014-07-19 17:24:19)

## 2015-04-14 NOTE — Patient Instructions (Signed)

## 2015-04-14 NOTE — Progress Notes (Signed)
Per Dr. Julien Nordmann, [place] to treat with Crea of 1.8 today.

## 2015-04-20 ENCOUNTER — Encounter (HOSPITAL_BASED_OUTPATIENT_CLINIC_OR_DEPARTMENT_OTHER): Payer: Medicare Other | Attending: Internal Medicine

## 2015-04-20 DIAGNOSIS — L89312 Pressure ulcer of right buttock, stage 2: Secondary | ICD-10-CM | POA: Insufficient documentation

## 2015-04-20 DIAGNOSIS — L89892 Pressure ulcer of other site, stage 2: Secondary | ICD-10-CM | POA: Insufficient documentation

## 2015-04-20 DIAGNOSIS — Z86718 Personal history of other venous thrombosis and embolism: Secondary | ICD-10-CM | POA: Diagnosis not present

## 2015-04-20 DIAGNOSIS — L89154 Pressure ulcer of sacral region, stage 4: Secondary | ICD-10-CM | POA: Diagnosis not present

## 2015-04-20 DIAGNOSIS — I120 Hypertensive chronic kidney disease with stage 5 chronic kidney disease or end stage renal disease: Secondary | ICD-10-CM | POA: Insufficient documentation

## 2015-04-20 DIAGNOSIS — G629 Polyneuropathy, unspecified: Secondary | ICD-10-CM | POA: Insufficient documentation

## 2015-04-20 DIAGNOSIS — I252 Old myocardial infarction: Secondary | ICD-10-CM | POA: Insufficient documentation

## 2015-04-20 DIAGNOSIS — H269 Unspecified cataract: Secondary | ICD-10-CM | POA: Insufficient documentation

## 2015-04-20 DIAGNOSIS — L89510 Pressure ulcer of right ankle, unstageable: Secondary | ICD-10-CM | POA: Insufficient documentation

## 2015-04-20 DIAGNOSIS — N186 End stage renal disease: Secondary | ICD-10-CM | POA: Insufficient documentation

## 2015-04-20 DIAGNOSIS — Z923 Personal history of irradiation: Secondary | ICD-10-CM | POA: Insufficient documentation

## 2015-04-21 ENCOUNTER — Telehealth: Payer: Self-pay | Admitting: Medical Oncology

## 2015-04-21 NOTE — Telephone Encounter (Signed)
Doing well after chemotherapy. She is asking for a foley catheter for convenience  because she is wetting her bed. "When I have to go , I have to go" She is wearing pull-ups,  depends and it is wetting around them-"[name] has tried everything. " She denies any back pain, dysuria. I told her a foley catheter would increase her risk for infection. She cannot get up by herself or walk . Note to [place].

## 2015-04-24 ENCOUNTER — Telehealth: Payer: Self-pay | Admitting: *Deleted

## 2015-04-24 NOTE — Telephone Encounter (Signed)
error 

## 2015-04-24 NOTE — Telephone Encounter (Signed)
Chemotherapy follow up call to pt.Spoke with pt daughter who advised pt is doing fine. No concerns regarding treatment at this time.

## 2015-04-24 NOTE — Telephone Encounter (Signed)
-----   Message from Ardeen Garland, RN sent at 04/24/2015  2:37 PM EST ----- Regarding: FW: wants foley catheter   ----- Message -----    From: Curt Bears, MD    Sent: 04/22/2015  11:21 AM      To: Ardeen Garland, RN Subject: RE: wants foley catheter                       She will need to discuss this with the urologist. ----- Message -----    From: Ardeen Garland, RN    Sent: 04/21/2015   9:54 AM      To: Curt Bears, MD Subject: wants foley catheter                           Doing well after chemotherapy. She is asking for a foley catheter for convenience  because she is wetting her bed. "When I have to go , I have to go" She is wearing pull-ups,  depends and it is wetting around them-"Dottie has tried everything. " She denies any back pain, dysuria. I told her a foley catheter would increase her risk for infection. She cannot get up by herself or walk .

## 2015-04-24 NOTE — Telephone Encounter (Signed)
Called pt spoke to Duaghter who advised " Momma hasnt said anything else about a catheter, so maybe she has changed her mind.. Discussed the request would need to be discussed with Urology. She verbalized understanding no further concerns.

## 2015-04-28 ENCOUNTER — Other Ambulatory Visit: Payer: Medicare Other

## 2015-04-28 ENCOUNTER — Ambulatory Visit: Payer: Medicare Other | Admitting: Physician Assistant

## 2015-04-28 ENCOUNTER — Ambulatory Visit: Payer: Medicare Other

## 2015-05-02 ENCOUNTER — Ambulatory Visit: Payer: Medicare Other | Admitting: Urology

## 2015-05-04 ENCOUNTER — Ambulatory Visit: Payer: Medicare Other | Admitting: Internal Medicine

## 2015-05-05 ENCOUNTER — Ambulatory Visit (HOSPITAL_COMMUNITY)
Admission: RE | Admit: 2015-05-05 | Discharge: 2015-05-05 | Disposition: A | Discharge status: Home / Self Care | Payer: Medicare Other | Source: Ambulatory Visit | Attending: Internal Medicine | Admitting: Internal Medicine

## 2015-05-05 ENCOUNTER — Ambulatory Visit (HOSPITAL_BASED_OUTPATIENT_CLINIC_OR_DEPARTMENT_OTHER): Payer: Medicare Other

## 2015-05-05 ENCOUNTER — Other Ambulatory Visit (HOSPITAL_BASED_OUTPATIENT_CLINIC_OR_DEPARTMENT_OTHER): Payer: Medicare Other

## 2015-05-05 ENCOUNTER — Ambulatory Visit (HOSPITAL_BASED_OUTPATIENT_CLINIC_OR_DEPARTMENT_OTHER): Payer: Medicare Other | Admitting: Physician Assistant

## 2015-05-05 VITALS — BP 97/51 | HR 78 | Temp 97.7°F | Resp 18

## 2015-05-05 DIAGNOSIS — C3492 Malignant neoplasm of unspecified part of left bronchus or lung: Secondary | ICD-10-CM

## 2015-05-05 DIAGNOSIS — C7951 Secondary malignant neoplasm of bone: Secondary | ICD-10-CM | POA: Diagnosis not present

## 2015-05-05 DIAGNOSIS — Z85118 Personal history of other malignant neoplasm of bronchus and lung: Secondary | ICD-10-CM | POA: Diagnosis not present

## 2015-05-05 DIAGNOSIS — Z79899 Other long term (current) drug therapy: Secondary | ICD-10-CM

## 2015-05-05 DIAGNOSIS — C3490 Malignant neoplasm of unspecified part of unspecified bronchus or lung: Secondary | ICD-10-CM

## 2015-05-05 DIAGNOSIS — D638 Anemia in other chronic diseases classified elsewhere: Secondary | ICD-10-CM

## 2015-05-05 DIAGNOSIS — N183 Chronic kidney disease, stage 3 unspecified: Secondary | ICD-10-CM

## 2015-05-05 DIAGNOSIS — N179 Acute kidney failure, unspecified: Secondary | ICD-10-CM

## 2015-05-05 DIAGNOSIS — Z5112 Encounter for antineoplastic immunotherapy: Secondary | ICD-10-CM | POA: Diagnosis not present

## 2015-05-05 DIAGNOSIS — E43 Unspecified severe protein-calorie malnutrition: Secondary | ICD-10-CM

## 2015-05-05 DIAGNOSIS — R5382 Chronic fatigue, unspecified: Secondary | ICD-10-CM

## 2015-05-05 LAB — CBC WITH DIFFERENTIAL/PLATELET
BASO%: 0.5 % (ref 0.0–2.0)
BASOS ABS: 0 10*3/uL (ref 0.0–0.1)
EOS ABS: 0 10*3/uL (ref 0.0–0.5)
EOS%: 0.3 % (ref 0.0–7.0)
HCT: 22.7 % — ABNORMAL LOW (ref 34.8–46.6)
HEMOGLOBIN: 7.4 g/dL — AB (ref 11.6–15.9)
LYMPH%: 7.3 % — AB (ref 14.0–49.7)
MCH: 27.3 pg (ref 25.1–34.0)
MCHC: 32.7 g/dL (ref 31.5–36.0)
MCV: 83.6 fL (ref 79.5–101.0)
MONO#: 0.6 10*3/uL (ref 0.1–0.9)
MONO%: 9.3 % (ref 0.0–14.0)
NEUT#: 4.9 10*3/uL (ref 1.5–6.5)
NEUT%: 82.6 % — ABNORMAL HIGH (ref 38.4–76.8)
PLATELETS: 218 10*3/uL (ref 145–400)
RBC: 2.71 10*6/uL — ABNORMAL LOW (ref 3.70–5.45)
RDW: 17.3 % — AB (ref 11.2–14.5)
WBC: 5.9 10*3/uL (ref 3.9–10.3)
lymph#: 0.4 10*3/uL — ABNORMAL LOW (ref 0.9–3.3)

## 2015-05-05 LAB — COMPREHENSIVE METABOLIC PANEL
ALBUMIN: 1.7 g/dL — AB (ref 3.5–5.0)
ALK PHOS: 117 U/L (ref 40–150)
ALT: <9 U/L (ref 0–55)
ANION GAP: 9 meq/L (ref 3–11)
AST: 10 U/L (ref 5–34)
BILIRUBIN TOTAL: 0.38 mg/dL (ref 0.20–1.20)
BUN: 25.9 mg/dL (ref 7.0–26.0)
CO2: 22 mEq/L (ref 22–29)
Calcium: 7.6 mg/dL — ABNORMAL LOW (ref 8.4–10.4)
Chloride: 104 mEq/L (ref 98–109)
Creatinine: 2 mg/dL — ABNORMAL HIGH (ref 0.6–1.1)
EGFR: 23 mL/min/{1.73_m2} — AB (ref >90)
GLUCOSE: 77 mg/dL (ref 70–140)
POTASSIUM: 4 meq/L (ref 3.5–5.1)
SODIUM: 135 meq/L — AB (ref 136–145)
TOTAL PROTEIN: 4.8 g/dL — AB (ref 6.4–8.3)

## 2015-05-05 LAB — TSH: TSH: 2.458 m[IU]/L (ref 0.308–3.960)

## 2015-05-05 MED ORDER — SODIUM CHLORIDE 0.9 % IV SOLN
200.0000 mg | Freq: Once | INTRAVENOUS | Status: AC
  Administered 2015-05-05: 200 mg via INTRAVENOUS
  Filled 2015-05-05: qty 8

## 2015-05-05 MED ORDER — PROCHLORPERAZINE MALEATE 10 MG PO TABS
ORAL_TABLET | ORAL | Status: AC
  Filled 2015-05-05: qty 1

## 2015-05-05 MED ORDER — SODIUM CHLORIDE 0.9 % IV SOLN
Freq: Once | INTRAVENOUS | Status: AC
  Administered 2015-05-05: 09:00:00 via INTRAVENOUS

## 2015-05-05 MED ORDER — ACETAMINOPHEN 325 MG PO TABS
ORAL_TABLET | ORAL | Status: AC
  Filled 2015-05-05: qty 2

## 2015-05-05 MED ORDER — HEPARIN SOD (PORK) LOCK FLUSH 100 UNIT/ML IV SOLN
500.0000 [IU] | Freq: Once | INTRAVENOUS | Status: AC | PRN
  Administered 2015-05-05: 500 [IU]
  Filled 2015-05-05: qty 5

## 2015-05-05 MED ORDER — PROCHLORPERAZINE MALEATE 10 MG PO TABS
10.0000 mg | ORAL_TABLET | Freq: Once | ORAL | Status: AC
  Administered 2015-05-05: 10 mg via ORAL

## 2015-05-05 MED ORDER — SODIUM CHLORIDE 0.9 % IJ SOLN
10.0000 mL | INTRAMUSCULAR | Status: DC | PRN
  Administered 2015-05-05: 10 mL
  Filled 2015-05-05: qty 10

## 2015-05-05 MED ORDER — ACETAMINOPHEN 325 MG PO TABS
650.0000 mg | ORAL_TABLET | Freq: Once | ORAL | Status: AC
  Administered 2015-05-05: 650 mg via ORAL

## 2015-05-05 NOTE — Patient Instructions (Addendum)
[facility] Discharge Instructions for Patients Receiving Chemotherapy  Today you received the following chemotherapy agents Keytruda   To help prevent nausea and vomiting after your treatment, we encourage you to take your nausea medication as prescribed.  If you develop nausea and vomiting that is not controlled by your nausea medication, call the clinic.   BELOW ARE SYMPTOMS THAT SHOULD BE REPORTED IMMEDIATELY:  *FEVER GREATER THAN 100.5 F  *CHILLS WITH OR WITHOUT FEVER  NAUSEA AND VOMITING THAT IS NOT CONTROLLED WITH YOUR NAUSEA MEDICATION  *UNUSUAL SHORTNESS OF BREATH  *UNUSUAL BRUISING OR BLEEDING  TENDERNESS IN MOUTH AND THROAT WITH OR WITHOUT PRESENCE OF ULCERS  *URINARY PROBLEMS  *BOWEL PROBLEMS  UNUSUAL RASH Items with * indicate a potential emergency and should be followed up as soon as possible.  Feel free to call the clinic you have any questions or concerns. The clinic phone number is [phone].  Please show the [facility] at check-in to the Emergency Department and triage nurse.   Blood Transfusion  A blood transfusion is a procedure in which you receive donated blood through an IV tube. You may need a blood transfusion because of illness, surgery, or injury. The blood may come from a donor, or it may be your own blood that you donated previously. The blood given in a transfusion is made up of different types of cells. You may receive:  Red blood cells. These carry oxygen and replace lost blood.  Platelets. These control bleeding.  Plasma. Thishelps blood to clot. If you have hemophilia or another clotting disorder, you may also receive other types of blood products. LET [facility] CARE PROVIDER KNOW ABOUT:  Any allergies you have.  All medicines you are taking, including vitamins, herbs, eye drops, creams, and over-the-counter medicines.  Previous problems you or members of your family have had with the use of  anesthetics.  Any blood disorders you have.  Previous surgeries you have had.  Any medical conditions you may have.  Any previous reactions you have had during a blood transfusion.  RISKS AND COMPLICATIONS Generally, this is a safe procedure. However, problems may occur, including:  Having an allergic reaction to something in the donated blood.  Fever. This may be a reaction to the white blood cells in the transfused blood.  Iron overload. This can happen from having many transfusions.  Transfusion-related acute lung injury (TRALI). This is a rare reaction that causes lung damage. The cause is not known.TRALI can occur within hours of a transfusion or several days later.  Sudden (acute) or delayed hemolytic reactions. This happens if your blood does not match the cells in your transfusion. Your body's defense system (immune system) may try to attack the new cells. This complication is rare.  Infection. This is rare. BEFORE THE PROCEDURE  You may have a blood test to determine your blood type. This is necessary to know what kind of blood your body will accept.  If you are going to have a planned surgery, you may donate your own blood. This may be done in case you need to have a transfusion.  If you have had an allergic reaction to a transfusion in the past, you may be given medicine to help prevent a reaction. Take this medicine only as directed by your health care provider.  You will have your temperature, blood pressure, and pulse monitored before the transfusion. PROCEDURE   An IV will be started in your hand or arm.  The bag of donated blood will be attached to your IV tube and given into your vein.  Your temperature, blood pressure, and pulse will be monitored regularly during the transfusion. This monitoring is done to detect early signs of a transfusion reaction.  If you have any signs or symptoms of a reaction, your transfusion will be stopped and you may be given  medicine.  When the transfusion is over, your IV will be removed.  Pressure may be applied to the IV site for a few minutes.  A bandage (dressing) will be applied. The procedure may vary among health care providers and hospitals. AFTER THE PROCEDURE  Your blood pressure, temperature, and pulse will be monitored regularly.   This information is not intended to replace advice given to you by your health care provider. Make sure you discuss any questions you have with your health care provider.   Document Released: 05/17/2000 Document Revised: 06/10/2014 Document Reviewed: 03/30/2014 Elsevier Interactive Patient Education [employer].

## 2015-05-05 NOTE — Progress Notes (Signed)
[facility] [phone]   [phone]  OFFICE PROGRESS NOTE  Wende Neighbors, MD [place] [address]  PRINCIPAL DIAGNOSIS: Stage IV non-small cell lung cancer diagnosed in March 2007, with disease recurrence in June 2016 with positive PDL 1 expression (50%).   PRIOR THERAPY:  1) Status post 6 cycles of systemic chemotherapy with carboplatin and docetaxel. Last dose was given January 16, 2006.  2) Palliative radiotherapy to the large right paraspinous mass with osseous invasion centered at L5 under the care of Dr. Valere Dross. 3) Systemic chemotherapy with carboplatin for AUC of 5 and paclitaxel 175 MG/M2 every 3 weeks with Neulasta support. First dose expected on 02/02/2015. Status post 2 cycles, last dose was given 02/23/2015 discontinued secondary to intolerance and mild disease progression.  CURRENT THERAPY: Ketruda 200 mg IV every 3 weeks. First dose 04/14/2015.  DISEASE STAGE: Stage IV non-small cell lung cancer diagnosed in March of 2007.  CHEMOTHERAPY INTENT: Palliative  CURRENT # OF CHEMOTHERAPY CYCLES: 2  CURRENT ANTIEMETICS: None  CURRENT SMOKING STATUS: Nonsmoker  ORAL CHEMOTHERAPY AND CONSENT: None  CURRENT BISPHOSPHONATES USE: None  LIVING WILL AND CODE STATUS: Full code  INTERVAL HISTORY: [name] 81 y.o. female returns to the clinic today for followup visit accompanied by her daughter and grandson.She reports feeling overall well. Her appetite is much improved. She continues to have mild low back pain and she is currently on Vicodin by her primary care physician. She denied having any significant chest pain, shortness of breath, cough or hemoptysis. She has no nausea or vomiting. She denied having any significant fever or chills. She is here for cycle 2 of Keytruda.  MEDICAL HISTORY: Past Medical History  Diagnosis Date  . Hypertension   . Hypercholesterolemia   . CKD (chronic kidney disease)   .  Coronary artery disease     a.  LHC (06/04/05): LHC done in [place] with high grade RCA => s/p BMS to RCA;  b.  Nuclear (09/14/09): Lexiscan; Inf infarct with mild peri-infarct ishemia, EF 52%; Low Risk.  [place] GERD (gastroesophageal reflux disease)   . Chronic anemia   . Ischemic cardiomyopathy     a. Echo (07/26/13): Mild LVH, EF 35-40%, diff HK, inf AK, Gr 2 DD, Tr AI, mildly dilated Ao root, MAC, mild MR, mild LAE, mod reduced RVSF.  . Non-small cell carcinoma of lung ([facility])     Stage IV  . Chronic fatigue 04/04/2015  . Chronic fatigue 04/04/2015    ALLERGIES:  is allergic to amlodipine; ciprofloxacin; mirtazapine; statins; aspirin; cephalexin; hydralazine; iron; lorazepam; sulfonamide derivatives; penicillins; shellfish allergy; and tape.  MEDICATIONS:  Current Outpatient Prescriptions  Medication Sig Dispense Refill  . diphenhydramine-acetaminophen (TYLENOL PM) 25-500 MG TABS tablet Take 0.5-1 tablets by mouth at bedtime as needed (pain/sleep).    . diphenoxylate-atropine (LOMOTIL) 2.5-0.025 MG per tablet Take 1 tablet by mouth 4 (four) times daily as needed for diarrhea or loose stools. (Patient not taking: Reported on 04/04/2015) 30 tablet 0  . enoxaparin (LOVENOX) 80 MG/0.8ML injection INJECT 1 SYRINGE SUBCUTANEOUS ONCE DAILY. 24 mL 0  . HYDROcodone-acetaminophen (NORCO/VICODIN) 5-325 MG tablet Take 1 tablet by mouth every 6 (six) hours as needed for moderate pain. Max APAP 3gm/24 hrs from all sources 30 tablet 0  . lidocaine-prilocaine (EMLA) cream Apply 1 application topically as needed. 30 g 2  . methylPREDNISolone (MEDROL DOSEPAK) 4 MG TBPK tablet Use as instructed 21 tablet 0  . pantoprazole (  PROTONIX) 40 MG tablet TAKE ONE TABLET BY MOUTH ONCE DAILY. 90 tablet 0  . potassium chloride SA (K-DUR,KLOR-CON) 20 MEQ tablet Take 1 tablet (20 mEq total) by mouth daily. (Patient not taking: Reported on 04/04/2015) 5 tablet 0  . SANTYL ointment Apply 1 application topically at bedtime as  needed. For bed sores     No current facility-administered medications for this visit.   Facility-Administered Medications Ordered in Other Visits  Medication Dose Route Frequency Provider Last Rate Last Dose  . heparin lock flush 100 unit/mL  500 Units Intracatheter Once PRN Curt Bears, MD      . pembrolizumab [facility]) 200 mg in sodium chloride 0.9 % 50 mL chemo infusion  200 mg Intravenous Once Curt Bears, MD 116 mL/hr at 05/05/15 1045 200 mg at 05/05/15 1045  . sodium chloride 0.9 % injection 10 mL  10 mL Intracatheter PRN Curt Bears, MD        SURGICAL HISTORY:  Past Surgical History  Procedure Laterality Date  . Hematoma evacuation  December 2006    groin  . Appendectomy    . Cataract extraction    . Hemorroidectomy    . Laminectomy      REVIEW OF SYSTEMS:  Constitutional: She denies any anorexia or weight loss. Appetite is much improved. Eyes: negative Ears, nose, mouth, throat, and face: negative Respiratory: negative Cardiovascular: negative Gastrointestinal: negative Genitourinary:Remarkable for chronic urinary incontinence requiring diapers Integument/breast: negative Hematologic/lymphatic: negative Musculoskeletal:positive for arthralgias, back pain and muscle weakness Neurological: negative Behavioral/Psych: negative Endocrine: negative Allergic/Immunologic: negative   PHYSICAL EXAMINATION: General appearance: alert, cooperative and no distress Head: Normocephalic, without obvious abnormality, atraumatic Neck: no adenopathy Lymph nodes: Cervical, supraclavicular, and axillary nodes normal. Resp: clear to auscultation bilaterally Back: symmetric, no curvature. ROM normal. No CVA tenderness., Decubitus ulcer in the lower back Cardio: regular rate and rhythm, S1, S2 normal, no murmur, click, rub or gallop. Right port shows an area of granulation tissue, but no infection is noted. GI: soft, non-tender; bowel sounds normal; no masses,  no  organomegaly Extremities: extremities normal, atraumatic, no cyanosis or edema Neurologic: Alert and oriented X 3, normal strength and tone. Normal symmetric reflexes. Normal coordination and gait  ECOG PERFORMANCE STATUS: 2 - Symptomatic, <50% confined to bed  There were no vitals taken for this visit.  LABORATORY DATA: CBC Latest Ref Rng 05/05/2015 04/14/2015 03/31/2015  WBC 3.9 - 10.3 10e3/uL 5.9 5.0 7.8  Hemoglobin 11.6 - 15.9 g/dL 7.4(L) 9.0(L) 10.7(L)  Hematocrit 34.8 - 46.6 % 22.7(L) 27.7(L) 32.5(L)  Platelets 145 - 400 10e3/uL 218 196 213   CMP Latest Ref Rng 05/05/2015 04/14/2015 03/31/2015  Glucose 70 - 140 mg/dl 77 75 81  BUN 7.0 - 26.0 mg/dL 25.9 29.9(H) 67.8(H)  Creatinine 0.6 - 1.1 mg/dL 2.0(H) 1.8(H) 2.4(H)  Sodium 136 - 145 mEq/L 135(L) 134(L) 135(L)  Potassium 3.5 - 5.1 mEq/L 4.0 4.3 4.1  Chloride 101 - 111 mmol/L - - -  CO2 22 - 29 mEq/L 22 20(L) 18(L)  Calcium 8.4 - 10.4 mg/dL 7.6(L) 7.6(L) 7.6(L)  Total Protein 6.4 - 8.3 g/dL 4.8(L) 4.9(L) 4.8(L)  Total Bilirubin 0.20 - 1.20 mg/dL 0.38 0.58 0.50  Alkaline Phos 40 - 150 U/L 117 117 148  AST 5 - 34 U/L '10 10 13  '$ ALT 0 - 55 U/L <9 <9 9       RADIOGRAPHIC STUDIES: No results found.  ASSESSMENT AND PLAN: This is a very pleasant 81 years old white female with history  of stage IV non-small cell lung cancer diagnosed in March of 2007 status post 6 cycles of systemic chemotherapy with carboplatin and docetaxel and has been observation since that time with no evidence for disease progression for almost 9 years.  She was found recently to have evidence for disease recurrence with large lumbar paraspinous mass with osseous invasion and the core biopsy was consistent with metastatic squamous cell carcinoma.  She is status post palliative radiotherapy to the soft tissue mass in the lumbar area. The patient underwent systemic chemotherapy with carboplatin and paclitaxel is status post 2 cycle but she has rough time  tolerating her systemic chemotherapy with significant pancytopenia, fatigue and weakness.  Her recent CT scan of the chest, abdomen and pelvis showed improvement in the soft tissue mass in the lumbar area but there was some mild increase in some of the pulmonary nodules. Her systemic chemotherapy with carboplatin and paclitaxel was discontinued secondary to intolerance and mild disease progression. The patient has 50% PDL 1 expression on her tumor tissue. She began her first treatment with Ketruda (pembrolizumab) at fixed dose of 200 mg IV every 3 weeks and the first dose of this treatment was on 04/14/2015. Her Hb today Is 7.4 without any bleeding issues. Thus, she would receive 1 unit of blood while in treatment. Will proceed with cycle #2. For pain management, the patient will continue on Vicodin as prescribed by her primary care physician. She will return in 3 weeks for the next treatment, with labs, that is on 12/23. The patient was advised to call immediately if she has any concerning symptoms in the interval.  All questions were answered. The patient knows to call the clinic with any problems, questions or concerns. We can certainly see the patient much sooner if necessary.   ADDENDUM: Hematology/Oncology Attending: I had a face to face encounter with the patient. I recommended her care plan. This is a very pleasant 81 years old white female with recurrent non-small cell lung cancer, squamous cell carcinoma who is currently undergoing treatment with immunotherapy with Ketruda (pembrolizumab) status post 1 cycle. The patient is tolerating her treatment well with no significant adverse effects. She continues to have mild fatigue secondary to anemia of chronic disease. We will arrange for the patient to receive 1 unit of PRBCs transfusion today. I recommended for the patient to proceed with cycle #2 of her immunotherapy as a scheduled. She will come back for follow-up visit in 3 weeks for  reevaluation before starting cycle #3. The patient was advised to call immediately if she has any concerning symptoms in the interval.  Disclaimer: This note was dictated with voice recognition software. Similar sounding words can inadvertently be transcribed and may be missed upon review.  Eilleen Kempf., MD 05/06/2015

## 2015-05-06 LAB — TYPE AND SCREEN
ABO/RH(D): O POS
Antibody Screen: NEGATIVE
Unit division: 0

## 2015-05-25 ENCOUNTER — Telehealth: Payer: Self-pay | Admitting: *Deleted

## 2015-05-25 NOTE — Telephone Encounter (Signed)
TC from patient asking about hair loss with her current chemotherapy regime. She started on Keytruda in [place] and did not think she would lose her hair, but is experiencing hair loss. Pt also with issues of malnutrition. Advised pt that there is a possibility of hair loss with her treatment but that it should grow back.. Pt states "I don't want to be baldheaded the rest of my life".  Encouraged her to discuss with Md at next appt which is tomorrow, 05/26/15.  Pt voiced understanding.

## 2015-05-26 ENCOUNTER — Ambulatory Visit (HOSPITAL_BASED_OUTPATIENT_CLINIC_OR_DEPARTMENT_OTHER): Payer: Medicare Other

## 2015-05-26 ENCOUNTER — Ambulatory Visit (HOSPITAL_BASED_OUTPATIENT_CLINIC_OR_DEPARTMENT_OTHER): Payer: Medicare Other | Admitting: Physician Assistant

## 2015-05-26 ENCOUNTER — Other Ambulatory Visit (HOSPITAL_BASED_OUTPATIENT_CLINIC_OR_DEPARTMENT_OTHER): Payer: Medicare Other

## 2015-05-26 ENCOUNTER — Other Ambulatory Visit (HOSPITAL_COMMUNITY)
Admission: RE | Admit: 2015-05-26 | Discharge: 2015-05-26 | Disposition: A | Discharge status: Home / Self Care | Payer: Medicare Other | Source: Ambulatory Visit | Attending: Internal Medicine | Admitting: Internal Medicine

## 2015-05-26 ENCOUNTER — Telehealth: Payer: Self-pay | Admitting: Internal Medicine

## 2015-05-26 ENCOUNTER — Ambulatory Visit: Payer: Medicare Other

## 2015-05-26 ENCOUNTER — Other Ambulatory Visit: Payer: Self-pay | Admitting: Medical Oncology

## 2015-05-26 VITALS — BP 142/77 | HR 110 | Resp 16

## 2015-05-26 VITALS — BP 86/46 | HR 105 | Temp 97.8°F | Resp 16

## 2015-05-26 DIAGNOSIS — N183 Chronic kidney disease, stage 3 unspecified: Secondary | ICD-10-CM

## 2015-05-26 DIAGNOSIS — E43 Unspecified severe protein-calorie malnutrition: Secondary | ICD-10-CM

## 2015-05-26 DIAGNOSIS — C7951 Secondary malignant neoplasm of bone: Secondary | ICD-10-CM

## 2015-05-26 DIAGNOSIS — Z5112 Encounter for antineoplastic immunotherapy: Secondary | ICD-10-CM

## 2015-05-26 DIAGNOSIS — R5382 Chronic fatigue, unspecified: Secondary | ICD-10-CM

## 2015-05-26 DIAGNOSIS — E860 Dehydration: Secondary | ICD-10-CM | POA: Diagnosis not present

## 2015-05-26 DIAGNOSIS — C3490 Malignant neoplasm of unspecified part of unspecified bronchus or lung: Secondary | ICD-10-CM

## 2015-05-26 DIAGNOSIS — Z95828 Presence of other vascular implants and grafts: Secondary | ICD-10-CM

## 2015-05-26 DIAGNOSIS — N179 Acute kidney failure, unspecified: Secondary | ICD-10-CM

## 2015-05-26 DIAGNOSIS — R110 Nausea: Secondary | ICD-10-CM

## 2015-05-26 DIAGNOSIS — C3492 Malignant neoplasm of unspecified part of left bronchus or lung: Secondary | ICD-10-CM

## 2015-05-26 DIAGNOSIS — E876 Hypokalemia: Secondary | ICD-10-CM

## 2015-05-26 DIAGNOSIS — D638 Anemia in other chronic diseases classified elsewhere: Secondary | ICD-10-CM

## 2015-05-26 LAB — COMPREHENSIVE METABOLIC PANEL
ALBUMIN: 1.7 g/dL — AB (ref 3.5–5.0)
ALT: 7 U/L — AB (ref 14–54)
AST: 11 U/L — AB (ref 15–41)
Alkaline Phosphatase: 132 U/L — ABNORMAL HIGH (ref 38–126)
Anion gap: 7 (ref 5–15)
BUN: 27 mg/dL — AB (ref 6–20)
CHLORIDE: 104 mmol/L (ref 101–111)
CO2: 23 mmol/L (ref 22–32)
CREATININE: 1.89 mg/dL — AB (ref 0.44–1.00)
Calcium: 7.6 mg/dL — ABNORMAL LOW (ref 8.9–10.3)
GFR calc Af Amer: 28 mL/min — ABNORMAL LOW (ref >60)
GFR calc non Af Amer: 24 mL/min — ABNORMAL LOW (ref >60)
GLUCOSE: 85 mg/dL (ref 65–99)
POTASSIUM: 3.1 mmol/L — AB (ref 3.5–5.1)
Sodium: 134 mmol/L — ABNORMAL LOW (ref 135–145)
Total Bilirubin: 0.6 mg/dL (ref 0.3–1.2)
Total Protein: 4.5 g/dL — ABNORMAL LOW (ref 6.5–8.1)

## 2015-05-26 LAB — CBC WITH DIFFERENTIAL/PLATELET
BASO%: 1.1 % (ref 0.0–2.0)
Basophils Absolute: 0.1 10*3/uL (ref 0.0–0.1)
EOS ABS: 0 10*3/uL (ref 0.0–0.5)
EOS%: 0.8 % (ref 0.0–7.0)
HCT: 27.2 % — ABNORMAL LOW (ref 34.8–46.6)
HEMOGLOBIN: 8.6 g/dL — AB (ref 11.6–15.9)
LYMPH%: 8.5 % — AB (ref 14.0–49.7)
MCH: 27.2 pg (ref 25.1–34.0)
MCHC: 31.8 g/dL (ref 31.5–36.0)
MCV: 85.6 fL (ref 79.5–101.0)
MONO#: 0.5 10*3/uL (ref 0.1–0.9)
MONO%: 8.4 % (ref 0.0–14.0)
NEUT%: 81.2 % — ABNORMAL HIGH (ref 38.4–76.8)
NEUTROS ABS: 5.1 10*3/uL (ref 1.5–6.5)
Platelets: 194 10*3/uL (ref 145–400)
RBC: 3.18 10*6/uL — AB (ref 3.70–5.45)
RDW: 15 % — AB (ref 11.2–14.5)
WBC: 6.2 10*3/uL (ref 3.9–10.3)
lymph#: 0.5 10*3/uL — ABNORMAL LOW (ref 0.9–3.3)

## 2015-05-26 LAB — TSH: TSH: 2.551 m[IU]/L (ref 0.308–3.960)

## 2015-05-26 MED ORDER — PROCHLORPERAZINE MALEATE 10 MG PO TABS
ORAL_TABLET | ORAL | Status: AC
  Filled 2015-05-26: qty 1

## 2015-05-26 MED ORDER — POTASSIUM CHLORIDE CRYS ER 20 MEQ PO TBCR: 20.0000 meq | EXTENDED_RELEASE_TABLET | Freq: Every day | ORAL | Status: DC

## 2015-05-26 MED ORDER — SODIUM CHLORIDE 0.9 % IJ SOLN
10.0000 mL | INTRAMUSCULAR | Status: DC | PRN
  Administered 2015-05-26: 10 mL
  Filled 2015-05-26: qty 10

## 2015-05-26 MED ORDER — SODIUM CHLORIDE 0.9 % IV SOLN
Freq: Once | INTRAVENOUS | Status: AC
  Administered 2015-05-26: 10:00:00 via INTRAVENOUS

## 2015-05-26 MED ORDER — HEPARIN SOD (PORK) LOCK FLUSH 100 UNIT/ML IV SOLN
500.0000 [IU] | Freq: Once | INTRAVENOUS | Status: AC | PRN
  Administered 2015-05-26: 500 [IU]
  Filled 2015-05-26: qty 5

## 2015-05-26 MED ORDER — SODIUM CHLORIDE 0.9 % IJ SOLN
10.0000 mL | INTRAMUSCULAR | Status: DC | PRN
  Administered 2015-05-26: 10 mL via INTRAVENOUS
  Filled 2015-05-26: qty 10

## 2015-05-26 MED ORDER — PROCHLORPERAZINE MALEATE 10 MG PO TABS: 10.0000 mg | ORAL_TABLET | Freq: Once | ORAL | Status: DC

## 2015-05-26 MED ORDER — SODIUM CHLORIDE 0.9 % IV SOLN
200.0000 mg | Freq: Once | INTRAVENOUS | Status: AC
  Administered 2015-05-26: 200 mg via INTRAVENOUS
  Filled 2015-05-26: qty 8

## 2015-05-26 MED ORDER — ONDANSETRON HCL 40 MG/20ML IJ SOLN
Freq: Once | INTRAMUSCULAR | Status: AC
  Administered 2015-05-26: 10:00:00 via INTRAVENOUS
  Filled 2015-05-26: qty 4

## 2015-05-26 NOTE — Progress Notes (Signed)
Quick Note:  Call patient with the result and order K Dur 20 meq po qd X 7 days ______ 

## 2015-05-26 NOTE — Telephone Encounter (Signed)
per pof to sch pt appt-gave pt copy of avs-adv [facility] will call to sch pt scan

## 2015-05-26 NOTE — Progress Notes (Signed)
Pt. Nauseated after drinking water. Discussed with Dr Julien Nordmann & compazine not given & zofran ordered & given & pt was better upon d/c.

## 2015-05-26 NOTE — Patient Instructions (Signed)

## 2015-05-26 NOTE — Progress Notes (Signed)
[place] [phone]   [phone]  OFFICE PROGRESS NOTE  [name] Neighbors, MD [place] [address]  PRINCIPAL DIAGNOSIS: Stage IV non-small cell lung cancer diagnosed in March 2007, with disease recurrence in June 2016 with positive PDL 1 expression (50%).   PRIOR THERAPY:  1) Status post 6 cycles of systemic chemotherapy with carboplatin and docetaxel. Last dose was given January 16, 2006.  2) Palliative radiotherapy to the large right paraspinous mass with osseous invasion centered at L5 under the care of Dr. Valere Dross. 3) Systemic chemotherapy with carboplatin for AUC of 5 and paclitaxel 175 MG/M2 every 3 weeks with Neulasta support. First dose expected on 02/02/2015. Status post 2 cycles, last dose was given 02/23/2015 discontinued secondary to intolerance and mild disease progression.  CURRENT THERAPY: Keytruda 200 mg IV every 3 weeks. First dose 04/14/2015.  DISEASE STAGE: Stage IV non-small cell lung cancer diagnosed in March of 2007.  CHEMOTHERAPY INTENT: Palliative  CURRENT # OF CHEMOTHERAPY CYCLES: 2  CURRENT ANTIEMETICS: None  CURRENT SMOKING STATUS: Nonsmoker  ORAL CHEMOTHERAPY AND CONSENT: None  CURRENT BISPHOSPHONATES USE: None  LIVING WILL AND CODE STATUS: Full code  INTERVAL HISTORY: [name] 81 y.o. female returns to the clinic today for followup visit accompanied by her daughter and grandson. She does not reports new events since last seen. Her appetite is not adequate. She is not taking nutritional supplements as instructed, per daughter's report. She continues to have mild low back pain and she is currently on Vicodin by her primary care physician. She denied having any significant chest pain, shortness of breath, cough or hemoptysis. She has no nausea or vomiting. She reports voice hoarseness. She denied having any significant fever or chills. She is here for cycle 3 of Keytruda.  MEDICAL  HISTORY: Past Medical History  Diagnosis Date  . Hypertension   . Hypercholesterolemia   . CKD (chronic kidney disease)   . Coronary artery disease     a.  LHC (06/04/05): LHC done in [place] with high grade RCA => s/p BMS to RCA;  b.  Nuclear (09/14/09): Lexiscan; Inf infarct with mild peri-infarct ishemia, EF 52%; Low Risk.  [place] GERD (gastroesophageal reflux disease)   . Chronic anemia   . Ischemic cardiomyopathy     a. Echo (07/26/13): Mild LVH, EF 35-40%, diff HK, inf AK, Gr 2 DD, Tr AI, mildly dilated Ao root, MAC, mild MR, mild LAE, mod reduced RVSF.  . Non-small cell carcinoma of lung ([place])     Stage IV  . Chronic fatigue 04/04/2015  . Chronic fatigue 04/04/2015    ALLERGIES:  is allergic to amlodipine; ciprofloxacin; mirtazapine; statins; aspirin; cephalexin; hydralazine; iron; lorazepam; sulfonamide derivatives; penicillins; shellfish allergy; and tape.  MEDICATIONS:  Current Outpatient Prescriptions  Medication Sig Dispense Refill  . diphenhydramine-acetaminophen (TYLENOL PM) 25-500 MG TABS tablet Take 0.5-1 tablets by mouth at bedtime as needed (pain/sleep).    . diphenoxylate-atropine (LOMOTIL) 2.5-0.025 MG per tablet Take 1 tablet by mouth 4 (four) times daily as needed for diarrhea or loose stools. (Patient not taking: Reported on 04/04/2015) 30 tablet 0  . enoxaparin (LOVENOX) 80 MG/0.8ML injection INJECT 1 SYRINGE SUBCUTANEOUS ONCE DAILY. 24 mL 0  . HYDROcodone-acetaminophen (NORCO/VICODIN) 5-325 MG tablet Take 1 tablet by mouth every 6 (six) hours as needed for moderate pain. Max APAP 3gm/24 hrs from all sources 30 tablet 0  . lidocaine-prilocaine (EMLA) cream Apply 1 application topically as needed. [place]  g 2  . methylPREDNISolone (MEDROL DOSEPAK) 4 MG TBPK tablet Use as instructed 21 tablet 0  . pantoprazole (PROTONIX) 40 MG tablet TAKE ONE TABLET BY MOUTH ONCE DAILY. 90 tablet 0  . potassium chloride SA (K-DUR,KLOR-CON) 20 MEQ tablet Take 1 tablet (20 mEq total) by  mouth daily. (Patient not taking: Reported on 04/04/2015) 5 tablet 0  . SANTYL ointment Apply 1 application topically at bedtime as needed. For bed sores     No current facility-administered medications for this visit.   Facility-Administered Medications Ordered in Other Visits  Medication Dose Route Frequency Provider Last Rate Last Dose  . sodium chloride 0.9 % injection 10 mL  10 mL Intravenous PRN Rondel Jumbo, PA-C   10 mL at 05/26/15 2458    SURGICAL HISTORY:  Past Surgical History  Procedure Laterality Date  . Hematoma evacuation  December 2006    groin  . Appendectomy    . Cataract extraction    . Hemorroidectomy    . Laminectomy      REVIEW OF SYSTEMS:  Constitutional: She denies any anorexia or weight loss. Appetite is not adequateEyes: negative Ears, nose, mouth, throat, and face: negative Respiratory: negative Cardiovascular: negative Gastrointestinal: negative Genitourinary:Remarkable for chronic urinary incontinence requiring diapers Integument/breast: negative Hematologic/lymphatic: negative Musculoskeletal:positive for arthralgias, back pain and muscle weakness Neurological: negative Behavioral/Psych: negative Endocrine: negative Allergic/Immunologic: negative   PHYSICAL EXAMINATION: General appearance: alert, cooperative and no distress Head: Normocephalic, without obvious abnormality, atraumatic Neck: no adenopathy Lymph nodes: Cervical, supraclavicular, and axillary nodes normal. Resp: clear to auscultation bilaterally Back: symmetric, no curvature. ROM normal. No CVA tenderness., Decubitus ulcer in the lower back Cardio: regular rate and rhythm, S1, S2 normal, no murmur, click, rub or gallop. Right port shows an area of granulation tissue, but no infection is noted. GI: soft, non-tender; bowel sounds normal; no masses,  no organomegaly Extremities: extremities normal, atraumatic, no cyanosis or edema Neurologic: Alert and oriented X 3, normal strength  and tone. Normal symmetric reflexes. Normal coordination and gait  ECOG PERFORMANCE STATUS: 2 - Symptomatic, <50% confined to bed  Blood pressure 86/46, pulse 105, temperature 97.8 F (36.6 C), temperature source Oral, resp. rate 16, SpO2 100 %.  LABORATORY DATA: CBC Latest Ref Rng 05/26/2015 05/05/2015 04/14/2015  WBC 3.9 - 10.3 10e3/uL 6.2 5.9 5.0  Hemoglobin 11.6 - 15.9 g/dL 8.6(L) 7.4(L) 9.0(L)  Hematocrit 34.8 - 46.6 % 27.2(L) 22.7(L) 27.7(L)  Platelets 145 - 400 10e3/uL 194 218 196   CMP Latest Ref Rng 05/05/2015 04/14/2015 03/31/2015  Glucose 70 - 140 mg/dl 77 75 81  BUN 7.0 - 26.0 mg/dL 25.9 29.9(H) 67.8(H)  Creatinine 0.6 - 1.1 mg/dL 2.0(H) 1.8(H) 2.4(H)  Sodium 136 - 145 mEq/L 135(L) 134(L) 135(L)  Potassium 3.5 - 5.1 mEq/L 4.0 4.3 4.1  Chloride 101 - 111 mmol/L - - -  CO2 22 - 29 mEq/L 22 20(L) 18(L)  Calcium 8.4 - 10.4 mg/dL 7.6(L) 7.6(L) 7.6(L)  Total Protein 6.4 - 8.3 g/dL 4.8(L) 4.9(L) 4.8(L)  Total Bilirubin 0.20 - 1.20 mg/dL 0.38 0.58 0.50  Alkaline Phos 40 - 150 U/L 117 117 148  AST 5 - 34 U/L '10 10 13  '$ ALT 0 - 55 U/L <9 <9 9       RADIOGRAPHIC STUDIES: No results found.  ASSESSMENT AND PLAN: This is a very pleasant 81 years old white female with history of stage IV non-small cell lung cancer diagnosed in March of 2007 status post 6 cycles of systemic chemotherapy  with carboplatin and docetaxel and has been observation since that time with no evidence for disease progression for almost 9 years.  She was found recently to have evidence for disease recurrence with large lumbar paraspinous mass with osseous invasion and the core biopsy was consistent with metastatic squamous cell carcinoma.  She is status post palliative radiotherapy to the soft tissue mass in the lumbar area. The patient underwent systemic chemotherapy with carboplatin and paclitaxel is status post 2 cycle but she has rough time tolerating her systemic chemotherapy with significant pancytopenia,  fatigue and weakness.  Her recent CT scan of the chest, abdomen and pelvis showed improvement in the soft tissue mass in the lumbar area but there was some mild increase in some of the pulmonary nodules. Her systemic chemotherapy with carboplatin and paclitaxel was discontinued secondary to intolerance and mild disease progression. The patient has 50% PDL 1 expression on her tumor tissue. She began her first treatment with Ketruda (pembrolizumab) at fixed dose of 200 mg IV every 3 weeks and the first dose of this treatment was on 04/14/2015. Her cycle 2 on 12/2 was complemented with 1 unit of blood transfusion for a Hb 7.4 as she was symptomatic  Will proceed with cycle 3 today as scheduled. For pain management, the patient will continue on Vicodin as prescribed by her primary care physician. She will come back for follow-up visit in 3 weeks for reevaluation before starting cycle #3. She was instructed to increase her nutritional supplements.  For her voice hoarseness, will await TSH results.  The patient was advised to call immediately if she has any concerning symptoms in the interval.  All questions were answered. The patient knows to call the clinic with any problems, questions or concerns. We can certainly see the patient much sooner if necessary.   Rondel Jumbo, PA-C 05/26/2015   ADDENDUM: Hematology/Oncology Attending: I had a face to face encounter with the patient. I recommended her care plan. This is a very pleasant 81 years old white female with metastatic non-small cell lung cancer currently undergoing treatment with immunotherapy with Ketruda (pembrolizumab) status post 2 cycles. The patient has been tolerating her treatment well with no significant adverse effects. She continues to have lack of appetite and weight loss. I strongly encouraged the patient to increase her oral intake and take nutritional supplements. We will proceed with cycle #3 of her treatment today as a  scheduled. She will come back for follow-up visit in 3 weeks for reevaluation and repeat CT scan of the chest, abdomen and pelvis without contrast. For the dehydration and nausea, we will give the patient 500 ML of normal saline with Zofran IV in the infusion room today. The patient was advised to call immediately if she has any concerning symptoms in the interval.  Disclaimer: This note was dictated with voice recognition software. Similar sounding words can inadvertently be transcribed and may be missed upon review.  Eilleen Kempf., MD 05/26/2015

## 2015-05-26 NOTE — Progress Notes (Signed)
CMET machine is down in lab, reviewed with MD, ok to treat without CMET, pt's creatine usually runs high continue to monitor for results, VO OK to treat without Cmet.

## 2015-05-30 ENCOUNTER — Ambulatory Visit (INDEPENDENT_AMBULATORY_CARE_PROVIDER_SITE_OTHER): Payer: Medicare Other | Admitting: Urology

## 2015-05-30 DIAGNOSIS — N1330 Unspecified hydronephrosis: Secondary | ICD-10-CM

## 2015-05-30 DIAGNOSIS — R1900 Intra-abdominal and pelvic swelling, mass and lump, unspecified site: Secondary | ICD-10-CM

## 2015-06-01 ENCOUNTER — Telehealth: Payer: Self-pay | Admitting: Internal Medicine

## 2015-06-01 NOTE — Telephone Encounter (Signed)
MOVED 1/13 F/U FROM SW TO LT DUE TO SW'S DEPARTURE. PER MM SCHEDULE WITH ANOTHER APP DUE TO PATIENT NEEDS TO KEEP HER APPOINTMENTS ON Friday. SPOKE WITH DTR RE CHANGE AND NEW TIME FOR 1/13 @ 9:15 AM.

## 2015-06-05 DIAGNOSIS — C7951 Secondary malignant neoplasm of bone: Secondary | ICD-10-CM | POA: Diagnosis not present

## 2015-06-05 DIAGNOSIS — G8220 Paraplegia, unspecified: Secondary | ICD-10-CM | POA: Diagnosis not present

## 2015-06-05 DIAGNOSIS — L89154 Pressure ulcer of sacral region, stage 4: Secondary | ICD-10-CM | POA: Diagnosis not present

## 2015-06-05 DIAGNOSIS — L89514 Pressure ulcer of right ankle, stage 4: Secondary | ICD-10-CM | POA: Diagnosis not present

## 2015-06-07 DIAGNOSIS — L89154 Pressure ulcer of sacral region, stage 4: Secondary | ICD-10-CM | POA: Diagnosis not present

## 2015-06-07 DIAGNOSIS — C7951 Secondary malignant neoplasm of bone: Secondary | ICD-10-CM | POA: Diagnosis not present

## 2015-06-07 DIAGNOSIS — L89514 Pressure ulcer of right ankle, stage 4: Secondary | ICD-10-CM | POA: Diagnosis not present

## 2015-06-07 DIAGNOSIS — G8220 Paraplegia, unspecified: Secondary | ICD-10-CM | POA: Diagnosis not present

## 2015-06-09 DIAGNOSIS — C7951 Secondary malignant neoplasm of bone: Secondary | ICD-10-CM | POA: Diagnosis not present

## 2015-06-09 DIAGNOSIS — L89154 Pressure ulcer of sacral region, stage 4: Secondary | ICD-10-CM | POA: Diagnosis not present

## 2015-06-09 DIAGNOSIS — G8220 Paraplegia, unspecified: Secondary | ICD-10-CM | POA: Diagnosis not present

## 2015-06-09 DIAGNOSIS — L89514 Pressure ulcer of right ankle, stage 4: Secondary | ICD-10-CM | POA: Diagnosis not present

## 2015-06-13 DIAGNOSIS — L89159 Pressure ulcer of sacral region, unspecified stage: Secondary | ICD-10-CM | POA: Diagnosis not present

## 2015-06-14 ENCOUNTER — Encounter (HOSPITAL_COMMUNITY): Payer: Self-pay

## 2015-06-14 ENCOUNTER — Ambulatory Visit (HOSPITAL_COMMUNITY)
Admission: RE | Admit: 2015-06-14 | Discharge: 2015-06-14 | Disposition: A | Discharge status: Home / Self Care | Payer: Medicare Other | Source: Ambulatory Visit | Attending: Physician Assistant | Admitting: Physician Assistant

## 2015-06-14 DIAGNOSIS — L89514 Pressure ulcer of right ankle, stage 4: Secondary | ICD-10-CM | POA: Diagnosis not present

## 2015-06-14 DIAGNOSIS — C3490 Malignant neoplasm of unspecified part of unspecified bronchus or lung: Secondary | ICD-10-CM

## 2015-06-14 DIAGNOSIS — L89154 Pressure ulcer of sacral region, stage 4: Secondary | ICD-10-CM | POA: Diagnosis not present

## 2015-06-14 DIAGNOSIS — N183 Chronic kidney disease, stage 3 (moderate): Secondary | ICD-10-CM | POA: Insufficient documentation

## 2015-06-14 DIAGNOSIS — C7951 Secondary malignant neoplasm of bone: Secondary | ICD-10-CM

## 2015-06-14 DIAGNOSIS — G8220 Paraplegia, unspecified: Secondary | ICD-10-CM | POA: Diagnosis not present

## 2015-06-14 DIAGNOSIS — I714 Abdominal aortic aneurysm, without rupture: Secondary | ICD-10-CM

## 2015-06-14 DIAGNOSIS — I712 Thoracic aortic aneurysm, without rupture: Secondary | ICD-10-CM

## 2015-06-14 DIAGNOSIS — R188 Other ascites: Secondary | ICD-10-CM | POA: Insufficient documentation

## 2015-06-14 DIAGNOSIS — N289 Disorder of kidney and ureter, unspecified: Secondary | ICD-10-CM

## 2015-06-14 DIAGNOSIS — N179 Acute kidney failure, unspecified: Secondary | ICD-10-CM

## 2015-06-14 DIAGNOSIS — J90 Pleural effusion, not elsewhere classified: Secondary | ICD-10-CM | POA: Diagnosis not present

## 2015-06-15 DIAGNOSIS — G8220 Paraplegia, unspecified: Secondary | ICD-10-CM | POA: Diagnosis not present

## 2015-06-15 DIAGNOSIS — C7951 Secondary malignant neoplasm of bone: Secondary | ICD-10-CM | POA: Diagnosis not present

## 2015-06-15 DIAGNOSIS — L89154 Pressure ulcer of sacral region, stage 4: Secondary | ICD-10-CM | POA: Diagnosis not present

## 2015-06-15 DIAGNOSIS — L89514 Pressure ulcer of right ankle, stage 4: Secondary | ICD-10-CM | POA: Diagnosis not present

## 2015-06-16 ENCOUNTER — Inpatient Hospital Stay (HOSPITAL_COMMUNITY): Payer: Medicare Other

## 2015-06-16 ENCOUNTER — Ambulatory Visit (HOSPITAL_BASED_OUTPATIENT_CLINIC_OR_DEPARTMENT_OTHER): Payer: Medicare Other

## 2015-06-16 ENCOUNTER — Ambulatory Visit (HOSPITAL_BASED_OUTPATIENT_CLINIC_OR_DEPARTMENT_OTHER): Payer: Medicare Other | Admitting: Nurse Practitioner

## 2015-06-16 ENCOUNTER — Other Ambulatory Visit: Payer: Medicare Other

## 2015-06-16 ENCOUNTER — Telehealth: Payer: Self-pay

## 2015-06-16 ENCOUNTER — Other Ambulatory Visit (HOSPITAL_BASED_OUTPATIENT_CLINIC_OR_DEPARTMENT_OTHER): Payer: Medicare Other

## 2015-06-16 ENCOUNTER — Encounter (HOSPITAL_COMMUNITY): Payer: Self-pay

## 2015-06-16 ENCOUNTER — Ambulatory Visit: Payer: Medicare Other

## 2015-06-16 ENCOUNTER — Inpatient Hospital Stay (HOSPITAL_COMMUNITY)
Admission: AD | Admit: 2015-06-16 | Discharge: 2015-07-03 | DRG: 871 | Disposition: A | Discharge status: Skilled Nursing Facility | Payer: Medicare Other | Source: Ambulatory Visit | Attending: Internal Medicine | Admitting: Internal Medicine

## 2015-06-16 VITALS — BP 83/66 | HR 87 | Temp 94.0°F | Resp 16 | Ht 68.0 in

## 2015-06-16 DIAGNOSIS — I5033 Acute on chronic diastolic (congestive) heart failure: Secondary | ICD-10-CM | POA: Diagnosis not present

## 2015-06-16 DIAGNOSIS — C7951 Secondary malignant neoplasm of bone: Secondary | ICD-10-CM | POA: Diagnosis not present

## 2015-06-16 DIAGNOSIS — R600 Localized edema: Secondary | ICD-10-CM | POA: Diagnosis not present

## 2015-06-16 DIAGNOSIS — T68XXXA Hypothermia, initial encounter: Secondary | ICD-10-CM | POA: Diagnosis present

## 2015-06-16 DIAGNOSIS — R5382 Chronic fatigue, unspecified: Secondary | ICD-10-CM

## 2015-06-16 DIAGNOSIS — C3490 Malignant neoplasm of unspecified part of unspecified bronchus or lung: Secondary | ICD-10-CM | POA: Diagnosis not present

## 2015-06-16 DIAGNOSIS — I9589 Other hypotension: Secondary | ICD-10-CM | POA: Diagnosis not present

## 2015-06-16 DIAGNOSIS — R52 Pain, unspecified: Secondary | ICD-10-CM

## 2015-06-16 DIAGNOSIS — N390 Urinary tract infection, site not specified: Secondary | ICD-10-CM | POA: Diagnosis present

## 2015-06-16 DIAGNOSIS — L89213 Pressure ulcer of right hip, stage 3: Secondary | ICD-10-CM | POA: Diagnosis present

## 2015-06-16 DIAGNOSIS — I255 Ischemic cardiomyopathy: Secondary | ICD-10-CM | POA: Diagnosis present

## 2015-06-16 DIAGNOSIS — E872 Acidosis: Secondary | ICD-10-CM | POA: Diagnosis not present

## 2015-06-16 DIAGNOSIS — N179 Acute kidney failure, unspecified: Secondary | ICD-10-CM | POA: Diagnosis not present

## 2015-06-16 DIAGNOSIS — Z79899 Other long term (current) drug therapy: Secondary | ICD-10-CM | POA: Diagnosis not present

## 2015-06-16 DIAGNOSIS — M85871 Other specified disorders of bone density and structure, right ankle and foot: Secondary | ICD-10-CM | POA: Diagnosis not present

## 2015-06-16 DIAGNOSIS — J90 Pleural effusion, not elsewhere classified: Secondary | ICD-10-CM | POA: Diagnosis not present

## 2015-06-16 DIAGNOSIS — I4891 Unspecified atrial fibrillation: Secondary | ICD-10-CM | POA: Diagnosis not present

## 2015-06-16 DIAGNOSIS — A419 Sepsis, unspecified organism: Principal | ICD-10-CM | POA: Diagnosis present

## 2015-06-16 DIAGNOSIS — I2510 Atherosclerotic heart disease of native coronary artery without angina pectoris: Secondary | ICD-10-CM | POA: Diagnosis present

## 2015-06-16 DIAGNOSIS — M7989 Other specified soft tissue disorders: Secondary | ICD-10-CM | POA: Diagnosis not present

## 2015-06-16 DIAGNOSIS — Z9221 Personal history of antineoplastic chemotherapy: Secondary | ICD-10-CM | POA: Diagnosis not present

## 2015-06-16 DIAGNOSIS — E7800 Pure hypercholesterolemia, unspecified: Secondary | ICD-10-CM | POA: Diagnosis present

## 2015-06-16 DIAGNOSIS — N184 Chronic kidney disease, stage 4 (severe): Secondary | ICD-10-CM | POA: Diagnosis not present

## 2015-06-16 DIAGNOSIS — N183 Chronic kidney disease, stage 3 unspecified: Secondary | ICD-10-CM | POA: Diagnosis present

## 2015-06-16 DIAGNOSIS — I129 Hypertensive chronic kidney disease with stage 1 through stage 4 chronic kidney disease, or unspecified chronic kidney disease: Secondary | ICD-10-CM | POA: Diagnosis not present

## 2015-06-16 DIAGNOSIS — Z7401 Bed confinement status: Secondary | ICD-10-CM

## 2015-06-16 DIAGNOSIS — G061 Intraspinal abscess and granuloma: Secondary | ICD-10-CM | POA: Diagnosis not present

## 2015-06-16 DIAGNOSIS — I5022 Chronic systolic (congestive) heart failure: Secondary | ICD-10-CM | POA: Diagnosis not present

## 2015-06-16 DIAGNOSIS — E875 Hyperkalemia: Secondary | ICD-10-CM | POA: Diagnosis not present

## 2015-06-16 DIAGNOSIS — D630 Anemia in neoplastic disease: Secondary | ICD-10-CM | POA: Diagnosis not present

## 2015-06-16 DIAGNOSIS — E43 Unspecified severe protein-calorie malnutrition: Secondary | ICD-10-CM

## 2015-06-16 DIAGNOSIS — L0291 Cutaneous abscess, unspecified: Secondary | ICD-10-CM | POA: Diagnosis not present

## 2015-06-16 DIAGNOSIS — L02818 Cutaneous abscess of other sites: Secondary | ICD-10-CM | POA: Diagnosis present

## 2015-06-16 DIAGNOSIS — B9689 Other specified bacterial agents as the cause of diseases classified elsewhere: Secondary | ICD-10-CM | POA: Diagnosis not present

## 2015-06-16 DIAGNOSIS — I82C11 Acute embolism and thrombosis of right internal jugular vein: Secondary | ICD-10-CM | POA: Diagnosis not present

## 2015-06-16 DIAGNOSIS — Z86718 Personal history of other venous thrombosis and embolism: Secondary | ICD-10-CM | POA: Diagnosis not present

## 2015-06-16 DIAGNOSIS — L02212 Cutaneous abscess of back [any part, except buttock]: Secondary | ICD-10-CM | POA: Diagnosis not present

## 2015-06-16 DIAGNOSIS — I959 Hypotension, unspecified: Secondary | ICD-10-CM | POA: Diagnosis not present

## 2015-06-16 DIAGNOSIS — C3480 Malignant neoplasm of overlapping sites of unspecified bronchus and lung: Secondary | ICD-10-CM | POA: Diagnosis not present

## 2015-06-16 DIAGNOSIS — L89154 Pressure ulcer of sacral region, stage 4: Secondary | ICD-10-CM | POA: Diagnosis present

## 2015-06-16 DIAGNOSIS — R197 Diarrhea, unspecified: Secondary | ICD-10-CM | POA: Diagnosis not present

## 2015-06-16 DIAGNOSIS — D649 Anemia, unspecified: Secondary | ICD-10-CM | POA: Diagnosis not present

## 2015-06-16 DIAGNOSIS — R627 Adult failure to thrive: Secondary | ICD-10-CM | POA: Diagnosis not present

## 2015-06-16 DIAGNOSIS — I950 Idiopathic hypotension: Secondary | ICD-10-CM | POA: Diagnosis not present

## 2015-06-16 DIAGNOSIS — L89813 Pressure ulcer of head, stage 3: Secondary | ICD-10-CM | POA: Diagnosis not present

## 2015-06-16 DIAGNOSIS — Z22321 Carrier or suspected carrier of Methicillin susceptible Staphylococcus aureus: Secondary | ICD-10-CM

## 2015-06-16 DIAGNOSIS — R609 Edema, unspecified: Secondary | ICD-10-CM | POA: Diagnosis not present

## 2015-06-16 DIAGNOSIS — Z681 Body mass index (BMI) 19 or less, adult: Secondary | ICD-10-CM

## 2015-06-16 DIAGNOSIS — M4620 Osteomyelitis of vertebra, site unspecified: Secondary | ICD-10-CM | POA: Diagnosis not present

## 2015-06-16 DIAGNOSIS — R7881 Bacteremia: Secondary | ICD-10-CM | POA: Diagnosis not present

## 2015-06-16 DIAGNOSIS — T83511A Infection and inflammatory reaction due to indwelling urethral catheter, initial encounter: Secondary | ICD-10-CM | POA: Diagnosis not present

## 2015-06-16 DIAGNOSIS — M6281 Muscle weakness (generalized): Secondary | ICD-10-CM | POA: Diagnosis not present

## 2015-06-16 DIAGNOSIS — Z923 Personal history of irradiation: Secondary | ICD-10-CM

## 2015-06-16 DIAGNOSIS — R279 Unspecified lack of coordination: Secondary | ICD-10-CM | POA: Diagnosis not present

## 2015-06-16 DIAGNOSIS — I82409 Acute embolism and thrombosis of unspecified deep veins of unspecified lower extremity: Secondary | ICD-10-CM | POA: Diagnosis not present

## 2015-06-16 DIAGNOSIS — R1319 Other dysphagia: Secondary | ICD-10-CM | POA: Diagnosis not present

## 2015-06-16 DIAGNOSIS — L8990 Pressure ulcer of unspecified site, unspecified stage: Secondary | ICD-10-CM | POA: Diagnosis present

## 2015-06-16 DIAGNOSIS — R278 Other lack of coordination: Secondary | ICD-10-CM | POA: Diagnosis not present

## 2015-06-16 DIAGNOSIS — Z87891 Personal history of nicotine dependence: Secondary | ICD-10-CM | POA: Diagnosis not present

## 2015-06-16 DIAGNOSIS — L89814 Pressure ulcer of head, stage 4: Secondary | ICD-10-CM | POA: Diagnosis not present

## 2015-06-16 DIAGNOSIS — L97519 Non-pressure chronic ulcer of other part of right foot with unspecified severity: Secondary | ICD-10-CM | POA: Insufficient documentation

## 2015-06-16 DIAGNOSIS — I480 Paroxysmal atrial fibrillation: Secondary | ICD-10-CM | POA: Diagnosis not present

## 2015-06-16 DIAGNOSIS — E440 Moderate protein-calorie malnutrition: Secondary | ICD-10-CM | POA: Diagnosis present

## 2015-06-16 DIAGNOSIS — E876 Hypokalemia: Secondary | ICD-10-CM | POA: Diagnosis not present

## 2015-06-16 DIAGNOSIS — A408 Other streptococcal sepsis: Secondary | ICD-10-CM | POA: Diagnosis not present

## 2015-06-16 DIAGNOSIS — M19041 Primary osteoarthritis, right hand: Secondary | ICD-10-CM | POA: Diagnosis not present

## 2015-06-16 DIAGNOSIS — D638 Anemia in other chronic diseases classified elsewhere: Secondary | ICD-10-CM | POA: Diagnosis present

## 2015-06-16 DIAGNOSIS — I82401 Acute embolism and thrombosis of unspecified deep veins of right lower extremity: Secondary | ICD-10-CM | POA: Diagnosis not present

## 2015-06-16 DIAGNOSIS — L89513 Pressure ulcer of right ankle, stage 3: Secondary | ICD-10-CM | POA: Diagnosis present

## 2015-06-16 DIAGNOSIS — B9561 Methicillin susceptible Staphylococcus aureus infection as the cause of diseases classified elsewhere: Secondary | ICD-10-CM | POA: Diagnosis not present

## 2015-06-16 DIAGNOSIS — I509 Heart failure, unspecified: Secondary | ICD-10-CM | POA: Diagnosis not present

## 2015-06-16 DIAGNOSIS — A4189 Other specified sepsis: Secondary | ICD-10-CM | POA: Diagnosis not present

## 2015-06-16 DIAGNOSIS — Z955 Presence of coronary angioplasty implant and graft: Secondary | ICD-10-CM

## 2015-06-16 DIAGNOSIS — C3492 Malignant neoplasm of unspecified part of left bronchus or lung: Secondary | ICD-10-CM

## 2015-06-16 DIAGNOSIS — Z95828 Presence of other vascular implants and grafts: Secondary | ICD-10-CM

## 2015-06-16 DIAGNOSIS — T8851XA Hypothermia following anesthesia, initial encounter: Secondary | ICD-10-CM | POA: Diagnosis not present

## 2015-06-16 LAB — CBC WITH DIFFERENTIAL/PLATELET
BASO%: 0.3 % (ref 0.0–2.0)
Basophils Absolute: 0 10*3/uL (ref 0.0–0.1)
Basophils Absolute: 0 10*3/uL (ref 0.0–0.1)
Basophils Relative: 0 %
EOS ABS: 0.1 10*3/uL (ref 0.0–0.5)
EOS%: 1.3 % (ref 0.0–7.0)
Eosinophils Absolute: 0 10*3/uL (ref 0.0–0.7)
Eosinophils Relative: 0 %
HCT: 28.6 % — ABNORMAL LOW (ref 34.8–46.6)
HEMATOCRIT: 27.8 % — AB (ref 36.0–46.0)
HEMOGLOBIN: 9.1 g/dL — AB (ref 11.6–15.9)
HEMOGLOBIN: 8.7 g/dL — AB (ref 12.0–15.0)
LYMPH%: 19.1 % (ref 14.0–49.7)
LYMPHS ABS: 0.2 10*3/uL — AB (ref 0.7–4.0)
Lymphocytes Relative: 3 %
MCH: 26.9 pg (ref 25.1–34.0)
MCH: 27.2 pg (ref 26.0–34.0)
MCHC: 31.7 g/dL (ref 31.5–36.0)
MCHC: 31.3 g/dL (ref 30.0–36.0)
MCV: 85 fL (ref 79.5–101.0)
MCV: 86.9 fL (ref 78.0–100.0)
MONO ABS: 0.4 10*3/uL (ref 0.1–1.0)
MONO#: 0.5 10*3/uL (ref 0.1–0.9)
MONO%: 10.5 % (ref 0.0–14.0)
MONOS PCT: 4 %
NEUT%: 68.8 % (ref 38.4–76.8)
NEUTROS ABS: 3.5 10*3/uL (ref 1.5–6.5)
NEUTROS ABS: 9.1 10*3/uL — AB (ref 1.7–7.7)
NEUTROS PCT: 93 %
Platelets: 215 10*3/uL (ref 145–400)
Platelets: 173 10*3/uL (ref 150–400)
RBC: 3.37 10*6/uL — AB (ref 3.70–5.45)
RBC: 3.2 MIL/uL — ABNORMAL LOW (ref 3.87–5.11)
RDW: 14.6 % — AB (ref 11.2–14.5)
RDW: 14.9 % (ref 11.5–15.5)
WBC: 5.1 10*3/uL (ref 3.9–10.3)
WBC: 9.7 10*3/uL (ref 4.0–10.5)
lymph#: 1 10*3/uL (ref 0.9–3.3)

## 2015-06-16 LAB — COMPREHENSIVE METABOLIC PANEL
ALBUMIN: 1.5 g/dL — AB (ref 3.5–5.0)
ALK PHOS: 164 U/L — AB (ref 40–150)
ALK PHOS: 152 U/L — AB (ref 38–126)
ALT: 11 U/L — ABNORMAL LOW (ref 14–54)
AST: 15 U/L (ref 5–34)
AST: 20 U/L (ref 15–41)
Albumin: 1.5 g/dL — ABNORMAL LOW (ref 3.5–5.0)
Anion Gap: 9 mEq/L (ref 3–11)
Anion gap: 10 (ref 5–15)
BILIRUBIN TOTAL: 0.38 mg/dL (ref 0.20–1.20)
BILIRUBIN TOTAL: 0.7 mg/dL (ref 0.3–1.2)
BUN: 32.3 mg/dL — AB (ref 7.0–26.0)
BUN: 32 mg/dL — AB (ref 6–20)
CHLORIDE: 104 mmol/L (ref 101–111)
CO2: 20 meq/L — AB (ref 22–29)
CO2: 18 mmol/L — ABNORMAL LOW (ref 22–32)
CREATININE: 2 mg/dL — AB (ref 0.6–1.1)
CREATININE: 2.08 mg/dL — AB (ref 0.44–1.00)
Calcium: 7.2 mg/dL — ABNORMAL LOW (ref 8.4–10.4)
Calcium: 6.9 mg/dL — ABNORMAL LOW (ref 8.9–10.3)
Chloride: 109 mEq/L (ref 98–109)
EGFR: 22 mL/min/{1.73_m2} — AB (ref >90)
GFR, EST AFRICAN AMERICAN: 25 mL/min — AB (ref >60)
GFR, EST NON AFRICAN AMERICAN: 22 mL/min — AB (ref >60)
GLUCOSE: 83 mg/dL (ref 70–140)
Glucose, Bld: 173 mg/dL — ABNORMAL HIGH (ref 65–99)
POTASSIUM: 3.8 mmol/L (ref 3.5–5.1)
Potassium: 3.7 mEq/L (ref 3.5–5.1)
SODIUM: 138 meq/L (ref 136–145)
Sodium: 132 mmol/L — ABNORMAL LOW (ref 135–145)
TOTAL PROTEIN: 4.5 g/dL — AB (ref 6.4–8.3)
TOTAL PROTEIN: 4.3 g/dL — AB (ref 6.5–8.1)

## 2015-06-16 LAB — PROTIME-INR
INR: 1.19 (ref 0.00–1.49)
Prothrombin Time: 15.3 seconds — ABNORMAL HIGH (ref 11.6–15.2)

## 2015-06-16 LAB — PROCALCITONIN: Procalcitonin: 7.53 ng/mL

## 2015-06-16 LAB — TSH: TSH: 3.249 m(IU)/L (ref 0.308–3.960)

## 2015-06-16 LAB — C-REACTIVE PROTEIN: CRP: 11 mg/dL — AB (ref <1.0)

## 2015-06-16 LAB — LACTIC ACID, PLASMA
LACTIC ACID, VENOUS: 2.2 mmol/L — AB (ref 0.5–2.0)
LACTIC ACID, VENOUS: 4.1 mmol/L — AB (ref 0.5–2.0)
Lactic Acid, Venous: 2.2 mmol/L (ref 0.5–2.0)

## 2015-06-16 LAB — SEDIMENTATION RATE: Sed Rate: 17 mm/hr (ref 0–22)

## 2015-06-16 LAB — APTT: aPTT: 42 seconds — ABNORMAL HIGH (ref 24–37)

## 2015-06-16 MED ORDER — SODIUM CHLORIDE 0.9 % IV BOLUS (SEPSIS)
250.0000 mL | Freq: Once | INTRAVENOUS | Status: AC
  Administered 2015-06-17: 250 mL via INTRAVENOUS

## 2015-06-16 MED ORDER — SODIUM CHLORIDE 0.9 % IV BOLUS (SEPSIS)
500.0000 mL | Freq: Once | INTRAVENOUS | Status: AC
  Administered 2015-06-16: 500 mL via INTRAVENOUS

## 2015-06-16 MED ORDER — DEXTROSE 5 % IV SOLN
1.0000 g | Freq: Three times a day (TID) | INTRAVENOUS | Status: DC
  Administered 2015-06-16 – 2015-06-19 (×9): 1 g via INTRAVENOUS
  Filled 2015-06-16 (×10): qty 1

## 2015-06-16 MED ORDER — SODIUM CHLORIDE 0.9 % IV SOLN
INTRAVENOUS | Status: DC
  Administered 2015-06-16 – 2015-06-18 (×3): via INTRAVENOUS

## 2015-06-16 MED ORDER — HEPARIN SOD (PORK) LOCK FLUSH 100 UNIT/ML IV SOLN
500.0000 [IU] | Freq: Once | INTRAVENOUS | Status: AC
  Administered 2015-06-16: 500 [IU] via INTRAVENOUS
  Filled 2015-06-16: qty 5

## 2015-06-16 MED ORDER — VANCOMYCIN HCL IN DEXTROSE 1-5 GM/200ML-% IV SOLN
1000.0000 mg | Freq: Once | INTRAVENOUS | Status: AC
  Administered 2015-06-16: 1000 mg via INTRAVENOUS
  Filled 2015-06-16: qty 200

## 2015-06-16 MED ORDER — SODIUM CHLORIDE 0.9 % IJ SOLN
10.0000 mL | INTRAMUSCULAR | Status: DC | PRN
  Administered 2015-06-16: 10 mL via INTRAVENOUS
  Filled 2015-06-16: qty 10

## 2015-06-16 MED ORDER — VANCOMYCIN HCL 500 MG IV SOLR
500.0000 mg | INTRAVENOUS | Status: DC
  Administered 2015-06-17 – 2015-06-18 (×2): 500 mg via INTRAVENOUS
  Filled 2015-06-16 (×3): qty 500

## 2015-06-16 MED ORDER — PANTOPRAZOLE SODIUM 40 MG PO TBEC
40.0000 mg | DELAYED_RELEASE_TABLET | Freq: Every day | ORAL | Status: DC
  Administered 2015-06-16 – 2015-07-03 (×18): 40 mg via ORAL
  Filled 2015-06-16 (×20): qty 1

## 2015-06-16 MED ORDER — COLLAGENASE 250 UNIT/GM EX OINT
1.0000 "application " | TOPICAL_OINTMENT | Freq: Every day | CUTANEOUS | Status: DC
  Administered 2015-06-16 – 2015-06-21 (×4): 1 via TOPICAL
  Filled 2015-06-16 (×2): qty 30

## 2015-06-16 MED ORDER — LIDOCAINE-PRILOCAINE 2.5-2.5 % EX CREA: 1.0000 "application " | TOPICAL_CREAM | CUTANEOUS | Status: DC | PRN

## 2015-06-16 MED ORDER — SODIUM CHLORIDE 0.9 % IJ SOLN
3.0000 mL | Freq: Two times a day (BID) | INTRAMUSCULAR | Status: DC
Start: 2015-06-16 — End: 2015-07-03
  Administered 2015-06-16 – 2015-06-29 (×11): 3 mL via INTRAVENOUS

## 2015-06-16 MED ORDER — HYDROCODONE-ACETAMINOPHEN 5-325 MG PO TABS
1.0000 | ORAL_TABLET | Freq: Four times a day (QID) | ORAL | Status: DC | PRN
  Filled 2015-06-16: qty 1

## 2015-06-16 MED ORDER — VANCOMYCIN 50 MG/ML ORAL SOLUTION
250.0000 mg | Freq: Four times a day (QID) | ORAL | Status: DC
  Administered 2015-06-16 – 2015-06-19 (×10): 250 mg via ORAL
  Filled 2015-06-16 (×17): qty 5

## 2015-06-16 NOTE — Progress Notes (Addendum)
[place] OFFICE PROGRESS NOTE   PRINCIPAL DIAGNOSIS: Stage IV non-small cell lung cancer diagnosed in March 2007, with disease recurrence in June 2016 with positive PDL 1 expression (50%).   PRIOR THERAPY:  1) Status post 6 cycles of systemic chemotherapy with carboplatin and docetaxel. Last dose was given January 16, 2006.  2) Palliative radiotherapy to the large right paraspinous mass with osseous invasion centered at L5 under the care of Dr. Valere Dross. 3) Systemic chemotherapy with carboplatin for AUC of 5 and paclitaxel 175 MG/M2 every 3 weeks with Neulasta support. First dose expected on 02/02/2015. Status post 2 cycles, last dose was given 02/23/2015 discontinued secondary to intolerance and mild disease progression.  CURRENT THERAPY: Keytruda 200 mg IV every 3 weeks. First dose 04/14/2015. Cycle 3 completed 05/26/2015.  INTERVAL HISTORY:   [name] returns as scheduled. She completed cycle 3 Keytruda 05/26/2015. She has been having diarrhea over the past 2 weeks. She estimates 2+ loose stools a day. No nausea or vomiting at present. She thinks her appetite is somewhat improved. No rash. She feels more short of breath than usual. She has increased back pain with prolonged sitting. The pain is relieved when she lays flat. She has a persistent wound at the sacrum. Her daughter reports the wound has drainage that is foul smelling. [name] denies fever, shaking chills.  Objective:  Vital signs in last 24 hours:  Blood pressure 83/66, pulse 87, temperature 94 F (34.4 C), resp. rate 16, height '5\' 8"'$  (1.727 m), SpO2 98 %.    HEENT: No thrush or ulcers. Resp: Lungs are clear. Cardio: Distant heart sounds. Regular. GI: Abdomen soft and nontender. No hepatomegaly. Vascular: Bilateral leg edema right greater than left. The right lower leg is wrapped with an Ace bandage. Neuro: She is alert and oriented. Skin: Sacral decubitus ulcer with serosanguineous drainage.    Port-A-Cath without erythema.  Lab Results:  Lab Results  Component Value Date   WBC 5.1 06/16/2015   HGB 9.1* 06/16/2015   HCT 28.6* 06/16/2015   MCV 85.0 06/16/2015   PLT 215 06/16/2015   NEUTROABS 3.5 06/16/2015    Imaging:  No results found.  Medications: I have reviewed the patient's current medications.  Assessment/Plan: 1. Stage IV non-small cell lung cancer diagnosed March 2007 status post 6 cycles of systemic chemotherapy with carboplatin and docetaxel on observation until recently when she was found to have evidence for disease recurrence with a large lumbar paraspinous mass with osseous invasion. Core biopsy was consistent with metastatic squamous cell carcinoma. She completed palliative radiation to the soft tissue mass in the lumbar area. She completed 2 cycles of carboplatin/Taxol. Cycle 2 was given on 02/23/2015. The treatment was discontinued secondary to intolerance and mild disease progression. She began [facility] 04/14/2015. She completed cycle 3 05/26/2015. 2. Diarrhea. Question secondary to [facility]. Question C. Difficile. 3. Sacral decubitus ulcer previously followed at the wound clinic. 4. Restaging CT scans 06/14/2015 with new small to moderate bilateral effusions; stable left upper lobe nodule; stable metastatic disease involving the bones; fluid and air collection paraspinous musculature overlying the right aspect of the lumbar spine.   Disposition: [name] has completed 3 cycles of Keytruda. The recent restaging CT scan shows stable bone disease and stable left lung nodule. There are new bilateral pleural effusions. At some point she may need a diagnostic thoracentesis.  [name] does not appear well. She is hypotensive and experiencing diarrhea of unclear etiology. The diarrhea might be treatment  related. Stool should be checked for C. difficile. She is likely dehydrated. The CT scan from 06/14/2015 shows a new fluid collection in the paraspinous  musculature overlying the right aspect of the lumbar spine. The radiologist felt this may be due to an abscess. Of note, she has a large sacral decubitus ulcer.   Patient was seen with Dr. Benay Spice at today's visit. We feel she would be best served by hospitalization. Dr. Benay Spice spoke with Dr. Doyle Askew. She will be admitted to the hospitalist service. We will make Dr. Julien Nordmann aware of the hospitalization.  45 minutes were spent face-to-face at today's visit with the majority of that time involved in counseling/coordination of care.  Ned Card ANP/GNP-BC   06/16/2015  10:26 AM  This was a shared visit with Ned Card. [name] was interviewed and examined. I discussed the case with the hospitalist service.  She presents with multiple complaints including diarrhea, a painful decubitus ulcer, and leg swelling. The restaging CT reveals stable bone lesions and a left lung nodule. There are new pleural effusions, and fluid collections in the paraspinous musculature.  I am concerned she may have a systemic infection. She will be admitted for intravenous hydration, antibiotics, and further diagnostic evaluation. The pleural effusions may be related to cardiac disease, and infection, or malignancy.  Pembrolizumab was held today.  Julieanne Manson, M.D.

## 2015-06-16 NOTE — Telephone Encounter (Signed)
Report given to [facility] RN,Nurse receiving Ms. Mates on 3W.

## 2015-06-16 NOTE — H&P (Addendum)
History and Physical  [name] HKV:[id number] DOB: 26-Jun-1933 DOA: 06/16/2015  Referring physician: EDP PCP: Wende Neighbors, MD   Chief Complaint: hypothermia, hypotension  HPI: [name] is a 82 y.o. female   With h/o htn, cad s/o stent in 2007, h/o chronic systolic chf, not a cnadidate for ace/arb due to ckd IV, she has been taken off blood pressure meds in 03/2015 due to progressive weakness and low bp, h/o stage IV NSCLC initially diagnosed in 2007, with disease recurrent in 11/2014, s/p XRT to right paraspinous mass with osseous invasion at l5 , finished on 12/23/2014, she was then treated with systemic chemo with [place] and paclitaxel which was stopped after two cycles due to intolerance, and mild disease progression, she is currently uncer immunotherapy with PD1 inhibitor, last on 12/23. Today she is sent from oncology clinic  Due to hypothermia and hypotension, reported has been having diarrhea for the last two weeks, denies ab pain, no n/v, she has a chronic sacral decubitus ulcer and chronic right lower leg wound which are care by home health RN.   Review of Systems:  Detail per HPI, Review of systems are otherwise negative  Past Medical History  Diagnosis Date  . Hypertension   . Hypercholesterolemia   . CKD (chronic kidney disease)   . Coronary artery disease     a.  LHC (06/04/05): LHC done in [place] with high grade RCA => s/p BMS to RCA;  b.  Nuclear (09/14/09): Lexiscan; Inf infarct with mild peri-infarct ishemia, EF 52%; Low Risk.  Marland Kitchen GERD (gastroesophageal reflux disease)   . Chronic anemia   . Ischemic cardiomyopathy     a. Echo (07/26/13): Mild LVH, EF 35-40%, diff HK, inf AK, Gr 2 DD, Tr AI, mildly dilated Ao root, MAC, mild MR, mild LAE, mod reduced RVSF.  . Non-small cell carcinoma of lung ([place])     Stage IV  . Chronic fatigue 04/04/2015  . Chronic fatigue 04/04/2015   Past Surgical History  Procedure Laterality Date  . Hematoma evacuation  December  2006    groin  . Appendectomy    . Cataract extraction    . Hemorroidectomy    . Laminectomy     Social History:  reports that she has quit smoking. Her smoking use included Cigarettes. She has never used smokeless tobacco. She reports that she does not drink alcohol or use illicit drugs. Patient lives at home & is able to participate in activities of daily living with assistance  Allergies  Allergen Reactions  . Amlodipine Swelling  . Ciprofloxacin Hives and Other (See Comments)    REACTION: weakness  . Mirtazapine Other (See Comments)    Hallucinations and nightmares, verbally aggressive   . Statins Other (See Comments)  . Aspirin Swelling    Mouth swelling, tongue  . Cephalexin Hives  . Hydralazine Nausea And Vomiting  . Iron Nausea And Vomiting  . Lorazepam Other (See Comments)    Hallucinations, verbally aggressive  . Sulfonamide Derivatives Hives  . Penicillins Other (See Comments)    Has patient had a PCN reaction causing immediate rash, facial/tongue/throat swelling, SOB or lightheadedness with hypotension: Yes Has patient had a PCN reaction causing severe rash involving mucus membranes or skin necrosis: Yes Has patient had a PCN reaction that required hospitalization No Has patient had a PCN reaction occurring within the last 10 years: No If all of the above answers are "NO", then may proceed with Cephalosporin use.   Marland Kitchen Shellfish Allergy  Hives and Rash  . Tape Rash    Family History  Problem Relation Age of Onset  . Heart failure Mother   . Lung cancer Sister   . Lung cancer Brother   . Stomach cancer Brother   . Heart attack Neg Hx   . Stroke Neg Hx       Prior to Admission medications   Medication Sig Start Date End Date Taking? Authorizing Provider  acetaminophen (TYLENOL) 500 MG tablet Take 500 mg by mouth daily as needed for mild pain.   Yes Historical Provider, MD  diphenoxylate-atropine (LOMOTIL) 2.5-0.025 MG per tablet Take 1 tablet by mouth 4  (four) times daily as needed for diarrhea or loose stools. 02/11/15  Yes Wyatt Portela, MD  HYDROcodone-acetaminophen (NORCO/VICODIN) 5-325 MG tablet Take 1 tablet by mouth every 6 (six) hours as needed for moderate pain. Max APAP 3gm/24 hrs from all sources 04/03/15  Yes Sherren Mocha, MD  lidocaine-prilocaine (EMLA) cream Apply 1 application topically as needed. 03/17/15  Yes Curt Bears, MD  pantoprazole (PROTONIX) 40 MG tablet TAKE ONE TABLET BY MOUTH ONCE DAILY. 02/07/15  Yes Sherren Mocha, MD  potassium chloride SA (K-DUR,KLOR-CON) 20 MEQ tablet Take 1 tablet (20 mEq total) by mouth daily. 03/17/15  Yes Owens Shark, NP  SANTYL ointment Apply 1 application topically at bedtime as needed. For bed sores 12/27/14  Yes Historical Provider, MD  enoxaparin (LOVENOX) 80 MG/0.8ML injection INJECT 1 SYRINGE SUBCUTANEOUS ONCE DAILY. 04/03/15   Curt Bears, MD  methylPREDNISolone (MEDROL DOSEPAK) 4 MG TBPK tablet Use as instructed 04/04/15   Curt Bears, MD    Physical Exam: BP 82/45 mmHg  Pulse 106  Temp(Src) 97.2 F (36.2 C) (Oral)  Resp 16  Ht '5\' 8"'$  (1.727 m)  Wt 55.9 kg (123 lb 3.8 oz)  BMI 18.74 kg/m2  SpO2 99%  General:  Frail, hard of hearing, NAD Eyes: PERRL ENT: unremarkable Neck: supple, no JVD Cardiovascular: sinus tachycardia Respiratory: diminished at basis, no rales, no wheezing, no rhonchi, Abdomen: soft/ND/ND, positive bowel sounds Skin: chronic sacral wound with scant clear drainage, unstageable, no apparent erythema, right lower leg wrapped with una boot, chronic right ankle/heal wound Musculoskeletal:  Bilateral pedal edema Psychiatric: calm/cooperative Neurologic: overall frail, decreased muscle tone, but no apparent focal deficit          Labs on Admission:  Basic Metabolic Panel:  Recent Labs Lab 06/16/15 0904  NA 138  K 3.7  CO2 20*  GLUCOSE 83  BUN 32.3*  CREATININE 2.0*  CALCIUM 7.2*   Liver Function Tests:  Recent Labs Lab  06/16/15 0904  AST 15  ALT <9  ALKPHOS 164*  BILITOT 0.38  PROT 4.5*  ALBUMIN 1.5*   No results for input(s): LIPASE, AMYLASE in the last 168 hours. No results for input(s): AMMONIA in the last 168 hours. CBC:  Recent Labs Lab 06/16/15 0904  WBC 5.1  NEUTROABS 3.5  HGB 9.1*  HCT 28.6*  MCV 85.0  PLT 215   Cardiac Enzymes: No results for input(s): CKTOTAL, CKMB, CKMBINDEX, TROPONINI in the last 168 hours.  BNP (last 3 results) No results for input(s): BNP in the last 8760 hours.  ProBNP (last 3 results) No results for input(s): PROBNP in the last 8760 hours.  CBG: No results for input(s): GLUCAP in the last 168 hours.  Radiological Exams on Admission: No results found.   Assessment/Plan Present on Admission:  . Hypotension  Hypothermia/hypotension , concerning of sepsis, with multiple possible source,  sacral wound, right leg wound, paraspinal fluids collection, diarrhea. Sepsis order set placed, culture obtained, empirically start on vanc/aztreonam and oral vanc for now. ivf fluids.  Paraspinal fluids collection: i have talked to IR Dr Kathlene Cote, patient will be npo after midnight, imaging guided drain with culture/ cell count, cytology if possible  Bilateral pleural effusion: currently on room air, denies sob, will check echocardiogram, monitor volume status, currently on ivf due to hypotension.  Sacral wound/right leg wound: wound culture, wound care.  Diarrhea: check c diff, empirically start on oral vanc  CKD iii/IV; at baseline, renal dosing meds  Stage Iv lung cancer: defer to oncology.   Severe malnutrition: nutrition consult.  FTT: over all poor prognosis, patient want to be full code.    DVT prophylaxis: scd's, family states patient not able to tolerate lovenox injection  Consultants: oncology dr Benay Spice IR for paraspinous fluids collection drain ( talked  To Dr. Kathlene Cote)  Code Status: full , confirmed with patient and daughter  Family  Communication:  Patient and daughter  Disposition Plan: patient was sent to med onc floor from onc clinic, but needed higher level of care due to hypothermia and hypotension, transfer to stepdown  Time spent: 63mns  Ibn Stief MD, PhD [facility] Pager [phone] 7PM-7AM, please contact night-coverage at [email or web address], password [email or web address]

## 2015-06-16 NOTE — Progress Notes (Signed)
Patient ID: [name], female   DOB: 06/25/1933, 82 y.o.   MRN: [id number]    Referring Physician(s): Xu,F  Chief Complaint:  Lumbar paraspinous air/fluid collection/stage IV lung cancer  Subjective: Pt familiar to IR service from prior port a cath placements 2007 and 2016, right nephrostomy /JJ stenting(01/09/15) as well as CT biopsy of a right paraspinal mass which revealed met squamous cell carcinoma on 11/23/14.She has known stage IV NSC lung cancer and has undergone chemotherapy as well as palliative radiotherapy to the rt paraspinal metastasis. She was admitted today with hypotension, diarrhea, hypothermia, gen weakness, malnutrition. Recent restaging CT has revealed new bil pleural effusions, ascites, and thick walled collection of fluid/air in rt lumbar paraspinous musculature suspicious for abscess (?secondarily infected tumor). Request received for CT guided aspiration of the above collection. Additional hx as below.  Past Medical History  Diagnosis Date  . Hypertension   . Hypercholesterolemia   . CKD (chronic kidney disease)   . Coronary artery disease     a.  LHC (06/04/05): LHC done in [place] with high grade RCA => s/p BMS to RCA;  b.  Nuclear (09/14/09): Lexiscan; Inf infarct with mild peri-infarct ishemia, EF 52%; Low Risk.  Marland Kitchen GERD (gastroesophageal reflux disease)   . Chronic anemia   . Ischemic cardiomyopathy     a. Echo (07/26/13): Mild LVH, EF 35-40%, diff HK, inf AK, Gr 2 DD, Tr AI, mildly dilated Ao root, MAC, mild MR, mild LAE, mod reduced RVSF.  . Non-small cell carcinoma of lung ([place])     Stage IV  . Chronic fatigue 04/04/2015  . Chronic fatigue 04/04/2015   Past Surgical History  Procedure Laterality Date  . Hematoma evacuation  December 2006    groin  . Appendectomy    . Cataract extraction    . Hemorroidectomy    . Laminectomy        Allergies: Amlodipine; Ciprofloxacin; Mirtazapine; Statins; Aspirin; Cephalexin; Hydralazine; Iron;  Lorazepam; Sulfonamide derivatives; Penicillins; Shellfish allergy; and Tape  Medications: Prior to Admission medications   Medication Sig Start Date End Date Taking? Authorizing Provider  acetaminophen (TYLENOL) 500 MG tablet Take 500 mg by mouth daily as needed for mild pain.   Yes Historical Provider, MD  diphenoxylate-atropine (LOMOTIL) 2.5-0.025 MG per tablet Take 1 tablet by mouth 4 (four) times daily as needed for diarrhea or loose stools. 02/11/15  Yes Wyatt Portela, MD  HYDROcodone-acetaminophen (NORCO/VICODIN) 5-325 MG tablet Take 1 tablet by mouth every 6 (six) hours as needed for moderate pain. Max APAP 3gm/24 hrs from all sources 04/03/15  Yes Sherren Mocha, MD  lidocaine-prilocaine (EMLA) cream Apply 1 application topically as needed. 03/17/15  Yes Curt Bears, MD  pantoprazole (PROTONIX) 40 MG tablet TAKE ONE TABLET BY MOUTH ONCE DAILY. 02/07/15  Yes Sherren Mocha, MD  potassium chloride SA (K-DUR,KLOR-CON) 20 MEQ tablet Take 1 tablet (20 mEq total) by mouth daily. 03/17/15  Yes Owens Shark, NP  SANTYL ointment Apply 1 application topically at bedtime as needed. For bed sores 12/27/14  Yes Historical Provider, MD  enoxaparin (LOVENOX) 80 MG/0.8ML injection INJECT 1 SYRINGE SUBCUTANEOUS ONCE DAILY. Patient not taking: Reported on 06/16/2015 04/03/15   Curt Bears, MD  methylPREDNISolone (MEDROL DOSEPAK) 4 MG TBPK tablet Use as instructed Patient not taking: Reported on 06/16/2015 04/04/15   Curt Bears, MD     Vital Signs: BP 82/45 mmHg  Pulse 106  Temp(Src) 97.2 F (36.2 C) (Oral)  Resp 16  Ht '5\' 8"'$  (1.727  m)  Wt 123 lb 3.8 oz (55.9 kg)  BMI 18.74 kg/m2  SpO2 99%  Physical Exam awake/alert, frail/weak appearing; chest- dim BS bases; rt chest wall PAC intact/site ok; heart- sl tachy/reg rhythm; abd- soft,+BS, NT; ext- bil LE edema, RLE bandaged; sacral decubitus ulcer  Imaging: Ct Abdomen Pelvis Wo Contrast  06/14/2015  CLINICAL DATA:  Lung cancer,  chemotherapy in progress. Radiation therapy complete. Nausea. EXAM: CT CHEST, ABDOMEN AND PELVIS WITHOUT CONTRAST TECHNIQUE: Multidetector CT imaging of the chest, abdomen and pelvis was performed following the standard protocol without IV contrast. COMPARISON:  03/29/2015. FINDINGS: CT CHEST FINDINGS Mediastinum/Lymph Nodes: Right IJ Port-A-Cath terminates in the low SVC. Ascending aorta measures 4.2 cm. Three-vessel coronary artery calcification. Heart size normal. No pericardial effusion. Mediastinal lymph nodes are not enlarged by CT size criteria. Hilar regions are difficult to definitively evaluate without IV contrast. No axillary adenopathy. Lungs/Pleura: Small to moderate bilateral effusions are new. Collapse/consolidation, with internal calcification, in the right lower lobe, new. Mild compressive atelectasis in the left lower lobe. Moderate centrilobular emphysema. Spiculated left upper lobe nodule measures 10 mm, previously 12 mm. Minimal volume loss in the lingula. Nodularity seen in the posterior right lung on the prior exam is less well visualized, due to adjacent pleural fluid and collapse/consolidation. Musculoskeletal: No worrisome lytic or sclerotic lesions. Healed anterior right rib fracture. CT ABDOMEN PELVIS FINDINGS Hepatobiliary: Liver is unremarkable. Cholecystectomy. No biliary ductal dilatation. Pancreas: Negative. Spleen: Negative. Adrenals/Urinary Tract: Adrenal glands are unremarkable. A somewhat heterogeneous low-attenuation lesion in the upper pole right kidney measures approximately 1.6 cm, stable in size but indeterminate without post-contrast imaging. Double-J right ureteral stent in satisfactory position. Bilateral renal atrophy. Left ureter is decompressed. Bladder is otherwise unremarkable. Stomach/Bowel: Stomach is unremarkable. Duodenal diverticula incidentally noted. Small bowel and colon are otherwise unremarkable. Appendectomy. Vascular/Lymphatic: Atherosclerotic  calcification of the arterial vasculature. Suprarenal aorta measures 3.0 cm, stable. No pathologically enlarged lymph nodes. Reproductive: Hysterectomy. Air is seen in the vaginal cuff. Ovaries are not well-visualized. Other: Scattered free fluid. Minimal presacral edema, stable. Mesenteries and peritoneum are otherwise unremarkable. A thick walled collection of fluid and air is seen in the right paraspinous musculature, overlying the right L3 transverse process, measuring 2.4 cm in greatest transaxial dimension. Fluid extends superiorly to the level of L1-2, and inferiorly overlying the right sacrum (series 2, images 64 to 99). This is largely new from 03/29/2015. Musculoskeletal: Right sacral soft tissue mass measures 4.6 x 7.0 cm, stable. Lytic lesions are also seen in the L4 and L5 vertebral bodies, as before. Associated L5 compression fracture, stable. Mild compression of the T11 superior endplate, stable. IMPRESSION: 1. New small to moderate bilateral effusions. Associated collapse/consolidation in the right lower lobe, possibly due to pneumonia. Underlying disease recurrence cannot be excluded. 2. Spiculated left upper lobe nodule is stable to minimally decreased in size. Additional scattered pulmonary nodules in the right lung are better seen on the prior exam. 3. Stable osseous metastatic disease. 4. Thick walled collection of fluid and air in the paraspinous musculature overlying the right aspect of the lumbar spine, largely new from 03/29/2015. Finding may be due to an abscess. Please correlate clinically. 5. Heterogeneous low-attenuation lesion in the upper pole right kidney, stable but indeterminate. 6. Ascending aortic aneurysm. Small suprarenal abdominal aortic aneurysm. 7. Scattered ascites. Electronically Signed   By: Lorin Picket M.D.   On: 06/14/2015 09:05   Ct Chest Wo Contrast  06/14/2015  CLINICAL DATA:  Lung cancer, chemotherapy in progress.  Radiation therapy complete. Nausea. EXAM: CT  CHEST, ABDOMEN AND PELVIS WITHOUT CONTRAST TECHNIQUE: Multidetector CT imaging of the chest, abdomen and pelvis was performed following the standard protocol without IV contrast. COMPARISON:  03/29/2015. FINDINGS: CT CHEST FINDINGS Mediastinum/Lymph Nodes: Right IJ Port-A-Cath terminates in the low SVC. Ascending aorta measures 4.2 cm. Three-vessel coronary artery calcification. Heart size normal. No pericardial effusion. Mediastinal lymph nodes are not enlarged by CT size criteria. Hilar regions are difficult to definitively evaluate without IV contrast. No axillary adenopathy. Lungs/Pleura: Small to moderate bilateral effusions are new. Collapse/consolidation, with internal calcification, in the right lower lobe, new. Mild compressive atelectasis in the left lower lobe. Moderate centrilobular emphysema. Spiculated left upper lobe nodule measures 10 mm, previously 12 mm. Minimal volume loss in the lingula. Nodularity seen in the posterior right lung on the prior exam is less well visualized, due to adjacent pleural fluid and collapse/consolidation. Musculoskeletal: No worrisome lytic or sclerotic lesions. Healed anterior right rib fracture. CT ABDOMEN PELVIS FINDINGS Hepatobiliary: Liver is unremarkable. Cholecystectomy. No biliary ductal dilatation. Pancreas: Negative. Spleen: Negative. Adrenals/Urinary Tract: Adrenal glands are unremarkable. A somewhat heterogeneous low-attenuation lesion in the upper pole right kidney measures approximately 1.6 cm, stable in size but indeterminate without post-contrast imaging. Double-J right ureteral stent in satisfactory position. Bilateral renal atrophy. Left ureter is decompressed. Bladder is otherwise unremarkable. Stomach/Bowel: Stomach is unremarkable. Duodenal diverticula incidentally noted. Small bowel and colon are otherwise unremarkable. Appendectomy. Vascular/Lymphatic: Atherosclerotic calcification of the arterial vasculature. Suprarenal aorta measures 3.0 cm,  stable. No pathologically enlarged lymph nodes. Reproductive: Hysterectomy. Air is seen in the vaginal cuff. Ovaries are not well-visualized. Other: Scattered free fluid. Minimal presacral edema, stable. Mesenteries and peritoneum are otherwise unremarkable. A thick walled collection of fluid and air is seen in the right paraspinous musculature, overlying the right L3 transverse process, measuring 2.4 cm in greatest transaxial dimension. Fluid extends superiorly to the level of L1-2, and inferiorly overlying the right sacrum (series 2, images 64 to 99). This is largely new from 03/29/2015. Musculoskeletal: Right sacral soft tissue mass measures 4.6 x 7.0 cm, stable. Lytic lesions are also seen in the L4 and L5 vertebral bodies, as before. Associated L5 compression fracture, stable. Mild compression of the T11 superior endplate, stable. IMPRESSION: 1. New small to moderate bilateral effusions. Associated collapse/consolidation in the right lower lobe, possibly due to pneumonia. Underlying disease recurrence cannot be excluded. 2. Spiculated left upper lobe nodule is stable to minimally decreased in size. Additional scattered pulmonary nodules in the right lung are better seen on the prior exam. 3. Stable osseous metastatic disease. 4. Thick walled collection of fluid and air in the paraspinous musculature overlying the right aspect of the lumbar spine, largely new from 03/29/2015. Finding may be due to an abscess. Please correlate clinically. 5. Heterogeneous low-attenuation lesion in the upper pole right kidney, stable but indeterminate. 6. Ascending aortic aneurysm. Small suprarenal abdominal aortic aneurysm. 7. Scattered ascites. Electronically Signed   By: Leanna Battles M.D.   On: 06/14/2015 09:05   Dg Chest Port 1 View  06/16/2015  CLINICAL DATA:  Sepsis. History of hypertension, coronary artery disease ischemic cardiomyopathy. History of carcinoma of the lung. EXAM: PORTABLE CHEST 1 VIEW COMPARISON:  Chest  CT, 06/14/2015.  Chest radiographs, 03/17/2015. FINDINGS: Opacity at the lung bases, obscuring hemidiaphragms, reflects the bilateral effusions and associated atelectasis noted on recent chest CT. No convincing pulmonary edema. Small spiculated nodule in the left upper lobe, measuring 1 cm on the prior CT, is  again noted. No convincing pneumonia. No pulmonary edema. No pneumothorax. Right anterior chest wall Port-A-Cath is stable with its tip in the lower superior vena cava. Cardiac silhouette is normal in size. No convincing mediastinal or hilar masses or enlarged lymph nodes. IMPRESSION: 1. No significant change when compared to the recent chest CT. 2. Bilateral pleural effusions with associated lung base atelectasis. No convincing pneumonia or edema. 1 cm left upper lobe spiculated nodule. Electronically Signed   By: Lajean Manes M.D.   On: 06/16/2015 16:01   Dg Foot 2 Views Right  06/16/2015  CLINICAL DATA:  Ulcer to the right lateral aspect of the foot and ankle. Lung cancer. EXAM: RIGHT FOOT - 2 VIEW COMPARISON:  CT of the abdomen pelvis. FINDINGS: Marked osteopenia is present. There is diffuse heterogeneity of the bone likely reflecting metastases. Is extensive soft tissue swelling is present over the distal foot. No focal osseous destruction is present. The ulceration is not discretely visualized. IMPRESSION: 1. Moderate osteopenia with diffuse heterogeneity of the marrow. This likely represents osseous metastases. Similar appearance could be seen with severe osteopenia. 2. Extensive soft tissue swelling. This may represent edema or infection. There is no underlying bone destruction. Electronically Signed   By: San Morelle M.D.   On: 06/16/2015 16:04    Labs:  CBC:  Recent Labs  05/05/15 0844 05/26/15 0808 06/16/15 0904 06/16/15 1710  WBC 5.9 6.2 5.1 9.7  HGB 7.4* 8.6* 9.1* 8.7*  HCT 22.7* 27.2* 28.6* 27.8*  PLT 218 194 215 173    COAGS:  Recent Labs  11/24/14 0700   01/03/15 0706 02/16/15 1226 03/07/15 1400 06/16/15 1710  INR 1.35  < > 1.31 1.10 0.99 1.19  APTT 35  --  48* 44*  --  42*  < > = values in this interval not displayed.  BMP:  Recent Labs  03/19/15 0546 03/20/15 0455  05/05/15 0844 05/26/15 0944 06/16/15 0904 06/16/15 1710  NA 137 137  < > 135* 134* 138 132*  K 3.8 4.3  < > 4.0 3.1* 3.7 3.8  CL 109 112*  --   --  104  --  104  CO2 21* 20*  < > 22 23 20* 18*  GLUCOSE 101* 82  < > 77 85 83 173*  BUN 54* 53*  < > 25.9 27* 32.3* 32*  CALCIUM 7.9* 7.9*  < > 7.6* 7.6* 7.2* 6.9*  CREATININE 2.64* 2.51*  < > 2.0* 1.89* 2.0* 2.08*  GFRNONAA 16* 17*  --   --  24*  --  22*  GFRAA 19* 20*  --   --  28*  --  25*  < > = values in this interval not displayed.  LIVER FUNCTION TESTS:  Recent Labs  05/05/15 0844 05/26/15 0944 06/16/15 0904 06/16/15 1710  BILITOT 0.38 0.6 0.38 0.7  AST 10 11* 15 20  ALT <9 7* <9 11*  ALKPHOS 117 132* 164* 152*  PROT 4.8* 4.5* 4.5* 4.3*  ALBUMIN 1.7* 1.7* 1.5* 1.5*    Assessment and Plan: Pt with hx stage IV lung cancer with known metastasis to rt paraspinous lumbar region, s/p chemoradiation; now with weakness, hypotension, elevated lactic acid, diarrhea, LE edema and CT finding of thick walled air/fluid collection in rt paraspinous lumbar musculature,?infected tumor/abscess. Request received for CT guided aspiration of the fluid collection. Imaging studies were reviewed by Dr. Kathlene Cote. Details/risks of procedure, incl but not limited to, bleeding, worsening infection d/w pt with her understanding and consent. Procedure  tent scheduled for 1/14 am.    Electronically Signed: D. Rowe Robert 06/16/2015, 6:43 PM   I spent a total of 20 minutes at the the patient's bedside AND on the patient's hospital floor or unit, greater than 50% of which was counseling/coordinating care for CT guided aspiration of rt paraspinous air/fluid collection

## 2015-06-16 NOTE — Progress Notes (Addendum)
ANTIBIOTIC CONSULT NOTE - INITIAL  Pharmacy Consult for Vancomycin, Aztreonam Indication: r/o sepsis  Allergies  Allergen Reactions  . Amlodipine Swelling  . Ciprofloxacin Hives and Other (See Comments)    REACTION: weakness  . Mirtazapine Other (See Comments)    Hallucinations and nightmares, verbally aggressive   . Statins Other (See Comments)  . Aspirin Swelling    Mouth swelling, tongue  . Cephalexin Hives  . Hydralazine Nausea And Vomiting  . Iron Nausea And Vomiting  . Lorazepam Other (See Comments)    Hallucinations, verbally aggressive  . Sulfonamide Derivatives Hives  . Penicillins Other (See Comments)    Has patient had a PCN reaction causing immediate rash, facial/tongue/throat swelling, SOB or lightheadedness with hypotension: Yes Has patient had a PCN reaction causing severe rash involving mucus membranes or skin necrosis: Yes Has patient had a PCN reaction that required hospitalization No Has patient had a PCN reaction occurring within the last 10 years: No If all of the above answers are "NO", then may proceed with Cephalosporin use.   . Shellfish Allergy Hives and Rash  . Tape Rash    Patient Measurements: Height: '5\' 8"'$  (172.7 cm) Weight: 123 lb 3.8 oz (55.9 kg) IBW/kg (Calculated) : 63.9  Vital Signs: Temp: 97.2 F (36.2 C) (01/13 1440) Temp Source: Oral (01/13 1440) BP: 82/45 mmHg (01/13 1440) Pulse Rate: 106 (01/13 1440) Intake/Output from previous day:   Intake/Output from this shift:    Labs:  Recent Labs  06/16/15 0904  WBC 5.1  HGB 9.1*  PLT 215  CREATININE 2.0*   Estimated Creatinine Clearance: 20.1 mL/min (by C-G formula based on Cr of 2). No results for input(s): VANCOTROUGH, VANCOPEAK, VANCORANDOM, GENTTROUGH, GENTPEAK, GENTRANDOM, TOBRATROUGH, TOBRAPEAK, TOBRARND, AMIKACINPEAK, AMIKACINTROU, AMIKACIN in the last 72 hours.   Microbiology: No results found for this or any previous visit (from the past 720 hour(s)).  Medical  History: Past Medical History  Diagnosis Date  . Hypertension   . Hypercholesterolemia   . CKD (chronic kidney disease)   . Coronary artery disease     a.  LHC (06/04/05): LHC done in [place] with high grade RCA => s/p BMS to RCA;  b.  Nuclear (09/14/09): Lexiscan; Inf infarct with mild peri-infarct ishemia, EF 52%; Low Risk.  Marland Kitchen GERD (gastroesophageal reflux disease)   . Chronic anemia   . Ischemic cardiomyopathy     a. Echo (07/26/13): Mild LVH, EF 35-40%, diff HK, inf AK, Gr 2 DD, Tr AI, mildly dilated Ao root, MAC, mild MR, mild LAE, mod reduced RVSF.  . Non-small cell carcinoma of lung ([place])     Stage IV  . Chronic fatigue 04/04/2015  . Chronic fatigue 04/04/2015    Medications:  Anti-infectives    Start     Dose/Rate Route Frequency Ordered Stop   06/17/15 1600  vancomycin (VANCOCIN) 500 mg in sodium chloride 0.9 % 100 mL IVPB     500 mg 100 mL/hr over 60 Minutes Intravenous Every 24 hours 06/16/15 1647     06/16/15 1800  vancomycin (VANCOCIN) 50 mg/mL oral solution 250 mg     250 mg Oral 4 times per day 06/16/15 1621     06/16/15 1700  vancomycin (VANCOCIN) IVPB 1000 mg/200 mL premix     1,000 mg 200 mL/hr over 60 Minutes Intravenous  Once 06/16/15 1647     06/16/15 1700  aztreonam (AZACTAM) 1 g in dextrose 5 % 50 mL IVPB     1 g 100 mL/hr over 30  Minutes Intravenous Every 8 hours 06/16/15 1647       Assessment: 82 y.o. female w/ PMH of CAD s/p stent, CHF, HTN, Stage IV NSCLC  currently on Keytruda, admitted 06/16/2015 with hypotension, hypothermia; diarrhea x 2 wks.  Also w/ chronic sacral decub ulcer and chronic RLE wound.  Pharmacy to dose vancomycin and aztreonam for sepsis with possible source paraspinous abscess.  Also on vancomycin PO for r/o C diff.  1/13 >> vancomycin >> 1/13 >> aztreonam >>   1/13 >> vancomycin PO >>  1/13 blood: IP  Tmax Afebrile WBC wnl Renal: SCr appears to be at baseline; CrCl 20 CG  1/11 CTabd: RLL consolidation, possibly PNA;  thick walled fluid/air collection in paraspinous muscle overlying R lumbar spine 1/13 XR foot: No osteo 1/13 CXR stable, no active infection   Goal of Therapy:  Vancomycin trough level 15-20 mcg/ml  Eradication of infection Appropriate antibiotic dosing for indication and renal function  Plan:  Day 1 antibiotics Vancomycin 1000 mg IV now, then 500 mg IV q24 hr Measure vancomycin trough levels at steady state as indicated Aztreonam 1g IV q8 hr. Opted for higher dosing d/t sepsis  Follow clinical course, renal function, culture results as available  Follow for de-escalation of antibiotics and LOT   Reuel Boom, PharmD, BCPS Pager: [phone] 06/16/2015, 4:48 PM

## 2015-06-17 ENCOUNTER — Inpatient Hospital Stay (HOSPITAL_COMMUNITY): Payer: Medicare Other

## 2015-06-17 ENCOUNTER — Encounter (HOSPITAL_COMMUNITY): Payer: Self-pay | Admitting: Radiology

## 2015-06-17 DIAGNOSIS — L8990 Pressure ulcer of unspecified site, unspecified stage: Secondary | ICD-10-CM

## 2015-06-17 DIAGNOSIS — E43 Unspecified severe protein-calorie malnutrition: Secondary | ICD-10-CM

## 2015-06-17 DIAGNOSIS — R609 Edema, unspecified: Secondary | ICD-10-CM

## 2015-06-17 LAB — URINE MICROSCOPIC-ADD ON

## 2015-06-17 LAB — COMPREHENSIVE METABOLIC PANEL
ALBUMIN: 1.3 g/dL — AB (ref 3.5–5.0)
ALT: 10 U/L — AB (ref 14–54)
AST: 16 U/L (ref 15–41)
Alkaline Phosphatase: 134 U/L — ABNORMAL HIGH (ref 38–126)
Anion gap: 9 (ref 5–15)
BUN: 32 mg/dL — AB (ref 6–20)
CHLORIDE: 105 mmol/L (ref 101–111)
CO2: 18 mmol/L — ABNORMAL LOW (ref 22–32)
CREATININE: 2 mg/dL — AB (ref 0.44–1.00)
Calcium: 6.8 mg/dL — ABNORMAL LOW (ref 8.9–10.3)
GFR calc Af Amer: 26 mL/min — ABNORMAL LOW (ref >60)
GFR, EST NON AFRICAN AMERICAN: 23 mL/min — AB (ref >60)
GLUCOSE: 98 mg/dL (ref 65–99)
POTASSIUM: 3.7 mmol/L (ref 3.5–5.1)
Sodium: 132 mmol/L — ABNORMAL LOW (ref 135–145)
Total Bilirubin: 0.6 mg/dL (ref 0.3–1.2)
Total Protein: 3.8 g/dL — ABNORMAL LOW (ref 6.5–8.1)

## 2015-06-17 LAB — CBC
HEMATOCRIT: 23.2 % — AB (ref 36.0–46.0)
Hemoglobin: 7.4 g/dL — ABNORMAL LOW (ref 12.0–15.0)
MCH: 27.5 pg (ref 26.0–34.0)
MCHC: 31.9 g/dL (ref 30.0–36.0)
MCV: 86.2 fL (ref 78.0–100.0)
PLATELETS: 128 10*3/uL — AB (ref 150–400)
RBC: 2.69 MIL/uL — AB (ref 3.87–5.11)
RDW: 15 % (ref 11.5–15.5)
WBC: 6.5 10*3/uL (ref 4.0–10.5)

## 2015-06-17 LAB — URINALYSIS, ROUTINE W REFLEX MICROSCOPIC
BILIRUBIN URINE: NEGATIVE
GLUCOSE, UA: NEGATIVE mg/dL
KETONES UR: NEGATIVE mg/dL
NITRITE: NEGATIVE
PROTEIN: NEGATIVE mg/dL
Specific Gravity, Urine: 1.011 (ref 1.005–1.030)
pH: 6 (ref 5.0–8.0)

## 2015-06-17 LAB — MRSA PCR SCREENING: MRSA BY PCR: NEGATIVE

## 2015-06-17 LAB — LACTIC ACID, PLASMA: LACTIC ACID, VENOUS: 1.4 mmol/L (ref 0.5–2.0)

## 2015-06-17 LAB — CORTISOL: Cortisol, Plasma: 15.8 ug/dL

## 2015-06-17 MED ORDER — FENTANYL CITRATE (PF) 100 MCG/2ML IJ SOLN
INTRAMUSCULAR | Status: AC | PRN
  Administered 2015-06-17: 50 ug via INTRAVENOUS

## 2015-06-17 MED ORDER — MIDAZOLAM HCL 2 MG/2ML IJ SOLN
INTRAMUSCULAR | Status: AC
  Filled 2015-06-17: qty 6

## 2015-06-17 MED ORDER — FENTANYL CITRATE (PF) 100 MCG/2ML IJ SOLN
INTRAMUSCULAR | Status: AC
  Filled 2015-06-17: qty 4

## 2015-06-17 MED ORDER — HEPARIN (PORCINE) IN NACL 100-0.45 UNIT/ML-% IJ SOLN
950.0000 [IU]/h | INTRAMUSCULAR | Status: DC
  Administered 2015-06-17: 950 [IU]/h via INTRAVENOUS
  Filled 2015-06-17 (×3): qty 250

## 2015-06-17 MED ORDER — MIDAZOLAM HCL 2 MG/2ML IJ SOLN
INTRAMUSCULAR | Status: AC | PRN
  Administered 2015-06-17: 1 mg via INTRAVENOUS

## 2015-06-17 MED ORDER — SODIUM BICARBONATE 650 MG PO TABS
650.0000 mg | ORAL_TABLET | Freq: Two times a day (BID) | ORAL | Status: DC
  Administered 2015-06-17 – 2015-07-03 (×33): 650 mg via ORAL
  Filled 2015-06-17 (×34): qty 1

## 2015-06-17 NOTE — Progress Notes (Signed)
PT Cancellation Note  Patient Details Name: [name] MRN: [id number] DOB: 02-18-1934   Cancelled Treatment:    Reason Eval/Treat Not Completed: Medical issues which prohibited therapy Pt found to have R LE DVTs.  Awaiting anticoagulation vs IVC filter plan.   Demaree Liberto,KATHrine E 06/17/2015, 3:59 PM Carmelia Bake, PT, DPT 06/17/2015 Pager: [phone]

## 2015-06-17 NOTE — Progress Notes (Signed)
ANTICOAGULATION CONSULT NOTE - Initial Consult  Pharmacy Consult for heparin Indication: DVT  Allergies  Allergen Reactions  . Amlodipine Swelling  . Ciprofloxacin Hives and Other (See Comments)    REACTION: weakness  . Mirtazapine Other (See Comments)    Hallucinations and nightmares, verbally aggressive   . Statins Other (See Comments)  . Aspirin Swelling    Mouth swelling, tongue  . Cephalexin Hives  . Hydralazine Nausea And Vomiting  . Iron Nausea And Vomiting  . Lorazepam Other (See Comments)    Hallucinations, verbally aggressive  . Sulfonamide Derivatives Hives  . Penicillins Other (See Comments)    Has patient had a PCN reaction causing immediate rash, facial/tongue/throat swelling, SOB or lightheadedness with hypotension: Yes Has patient had a PCN reaction causing severe rash involving mucus membranes or skin necrosis: Yes Has patient had a PCN reaction that required hospitalization No Has patient had a PCN reaction occurring within the last 10 years: No If all of the above answers are "NO", then may proceed with Cephalosporin use.   . Shellfish Allergy Hives and Rash  . Tape Rash    Patient Measurements: Height: '5\' 8"'$  (172.7 cm) Weight: 132 lb 4.4 oz (60 kg) IBW/kg (Calculated) : 63.9 Heparin Dosing Weight:   Vital Signs: Temp: 94.3 F (34.6 C) (01/14 1600) Temp Source: Axillary (01/14 1600) BP: 121/47 mmHg (01/14 1800) Pulse Rate: 71 (01/14 1800)  Labs:  Recent Labs  06/16/15 0904 06/16/15 1710 06/17/15 0315  HGB 9.1* 8.7* 7.4*  HCT 28.6* 27.8* 23.2*  PLT 215 173 128*  APTT  --  42*  --   LABPROT  --  15.3*  --   INR  --  1.19  --   CREATININE 2.0* 2.08* 2.00*    Estimated Creatinine Clearance: 21.6 mL/min (by C-G formula based on Cr of 2).   Medical History: Past Medical History  Diagnosis Date  . Hypertension   . Hypercholesterolemia   . CKD (chronic kidney disease)   . Coronary artery disease     a.  LHC (06/04/05): LHC done in  [place] with high grade RCA => s/p BMS to RCA;  b.  Nuclear (09/14/09): Lexiscan; Inf infarct with mild peri-infarct ishemia, EF 52%; Low Risk.  Marland Kitchen GERD (gastroesophageal reflux disease)   . Chronic anemia   . Ischemic cardiomyopathy     a. Echo (07/26/13): Mild LVH, EF 35-40%, diff HK, inf AK, Gr 2 DD, Tr AI, mildly dilated Ao root, MAC, mild MR, mild LAE, mod reduced RVSF.  . Non-small cell carcinoma of lung ([place])     Stage IV  . Chronic fatigue 04/04/2015  . Chronic fatigue 04/04/2015    Medications:  Scheduled:  . aztreonam  1 g Intravenous Q8H  . collagenase  1 application Topical Daily  . pantoprazole  40 mg Oral Daily  . sodium bicarbonate  650 mg Oral BID  . sodium chloride  3 mL Intravenous Q12H  . vancomycin  250 mg Oral 4 times per day  . vancomycin  500 mg Intravenous Q24H    Assessment: Pt is a 82 yo female diagnosed with DVT noted in the right common femoral vein, right femoral vein, right profunda femoral vein, right popliteal vein, and right proximal peroneal veins. Pt was previously on enoxaparin PTA but has not been on medication for greater than 30 days.    On consultation with Dr. Erlinda Hong, due to concern of dropping Hgb levels, omit heparin bolus. Also, MD would like a lower threshold for  anticoagulation level. Therefore, target anti-Xa is to be 0.3 to 0.5.    Goal of Therapy:  Heparin level 0.3 to 0.5 Monitor platelets by anticoagulation protocol: Yes   Plan:  Starting heparin drip at 1000 units/hr. Anti-Xa level due with AM labs  Royetta Asal, PharmD, BCPS Pager [phone] 06/17/2015 8:13 PM

## 2015-06-17 NOTE — Consult Note (Signed)
WOC wound consult note Reason for Consult:Chronic, non-healing Stage 4 wound to sacrum, Stage 3 to right hip and right lateral malleolus Wound type:pressure Pressure Ulcer POA: Yes Measurement: Sacral Stage 4:  5.5cm x 5.5cm x 5.5cm in center tunnel and 0.4cm overall depth.  Right hip:  4cm x 2cm area with two Stage 3 pressure injuries within.  The distal wound is the largest and measures 2cm x 2xm x 0.2cm.  Right lateral malleolus, Stage 3:  0.5cm round x 0.2cm deep. Wound UKR:CVKF pink, moist Drainage (amount, consistency, odor) Large amount of light yellow exudate from sacrum; small amounts of light yellow exudate from right hip and right lateral malleolus Periwound:Intact, dry Dressing procedure/placement/frequency: Orders are provided for Nursing to pad, protect the right hip and right lateral malleolus.  For the sacral wound, we will fill the defect with a ribbon of silver hydrofiber (Aquacel Ag+), top the wound will silver hydrofiber and cover all with a soft silicone foam dressing.  A therapeutic mattress will be provided upon transfer to the floor; the patient is currently on a therapeutic mattress with low air loss feature in ICU but this bed must stay here upon transfer.  Bilateral pressure redistribution heel boots are provided. [facility] nursing team will not follow, but will remain available to this patient, the nursing and medical teams.  Please re-consult if needed. Thanks, Maudie Flakes, MSN, RN, [place], Arther Abbott  Pager# [phone]

## 2015-06-17 NOTE — Progress Notes (Signed)
CRITICAL VALUE ALERT  Critical value received:positive blood culture with gram negative rods  Date of notification:  06/17/2015  Time of notification: 0742  Critical value read back:Yes.    Nurse who received alert:  Hale Bogus.  MD notified (1st page):  Dr. Florencia Reasons  Time of first page: [phone]  MD notified (2nd page):  Time of second page:  Responding MD: Dr. Florencia Reasons  Time MD responded:  [phone]. MD on unit.

## 2015-06-17 NOTE — Progress Notes (Signed)
This is a 82 year old woman with advanced non-small cell lung cancer currently receiving palliative therapy with Keytruda and therapy was supposed to be given on 06/16/2015 and was held due to hypotension, diarrhea and possible sepsis. Patient was hospitalized for intravenous antibiotics and hydration. She was found to have fluid collection around the paraspinous lumbar area concerning for abscess. She is scheduled to have CT-guided aspiration tentatively for 06/17/2015.  She was found to have lower extremity edema and Dopplers revealed deep vein thrombosis in the right common femoral vein, right popliteal vein among others.  I was asked to comment about anticoagulation regarding this woman. Her chart was reviewed and laboratory testing were also reviewed today.  I see no contraindication for full dose anticoagulation at this time. Options of anticoagulation would include low molecular weight heparin if it could be done with her kidney function. Her creatinine clearance is around 25-30 mL/m. If this can be done per pharmacy then that would be my preference.  If renal function is prohibitive, then intravenous heparin can be used with a transition to warfarin at a later date. I would not start full dose anticoagulation unless her procedures complete.  If multiple procedures are planned, consideration for IVC filter at that time would be reasonable. His GI bleeding is suspected, that would also be a consideration for IVC filter.

## 2015-06-17 NOTE — Progress Notes (Signed)
*  PRELIMINARY RESULTS* Vascular Ultrasound Lower extremity venous duplex has been completed.  Preliminary findings: DVT noted in the right common femoral vein, right femoral vein, right profunda femoral vein, right popliteal vein, and right proximal peroneal veins. No DVT LLE.   Landry Mellow, RDMS, RVT  06/17/2015, 9:22 AM

## 2015-06-17 NOTE — Sedation Documentation (Signed)
Patient denies pain and is resting comfortably.  

## 2015-06-17 NOTE — Procedures (Signed)
CT aspiration paraspinal collection returning 59m purulent, sent for GS, C&S No complication No blood loss. [facility]

## 2015-06-17 NOTE — Progress Notes (Addendum)
PROGRESS NOTE  [name] RSW:[id number] DOB: [birth year] DOA: 06/16/2015 PCP: Wende Neighbors, MD  HPI/Recap of past 24 hours:  Feeling better, smiling denies sob, reported chronic back pain and leg pain, npo , Awaiting for Ct guided paraspinal fluids aspiration, family not in room  Assessment/Plan: Active Problems:   Pressure ulcer   Non-small cell lung cancer ([place])   Paraspinal mass   Metastatic squamous cell carcinoma to bone ([facility])   Hypotension   Severe protein-calorie malnutrition ([place])   Hypothermia   Paraspinal abscess ([place])  Hypothermia/hypotension ,  1/13 on admission: concerning of sepsis, with multiple possible source including sacral wound, right leg wound, paraspinal fluids collection, diarrhea. Sepsis order set placed, blood culture and wound culture obtained, awaiting cdiff/urine/mrsa screening sample collection, empirically start on vanc/aztreonam (h/o pcn,keflex,cipro and sulfa allergy)and oral vanc for now. ivf fluids. 1/14: bp and body temperature normalized  g- rod bacteremia: on azetreonam.   Paraspinal fluids collection: i have talked to IR Dr Kathlene Cote, CT guided aspiration of the paraspinal fluids with culture /gram stain/ cell count, cytology if possible  Sacral wound: chronic, per daughter, patient was recently released from the wound care center, has been getting wound care by home health RN. On visual inspection, wound appear to be deep, but with clean base, minimal clear scant drainage, no associated erythema, I have discussed Ct ab/pel obtained on 1/1with radiology Dr Kathlene Cote, he state the sacral wound seems to be superficial on CT scan, but there is a concern of the wound extend to the paraspinal fluids collection. Wound culture obtained, wound care consulted.  right leg wound: right leg xray with extensive soft tissue swelling, edema vs infection, marked osteropenia? Osseous metastases.  wound culture, wound care. Lower extremity venous US  ordered.  Diarrhea: check c diff, empirically start on oral vanc  Bilateral pleural effusion: currently on room air, denies sob, echocardiogram pending, monitor volume status, currently on ivf due to hypotension. CKD iii/IV; at baseline, renal dosing meds  Bilateral pedal edema, right lower extremity edema: venous US pending ,H/o bilateral lower extremity DVT: was on lovenox , per daughter lovenox discontinued  two months ago due to " patient is not able to tolerate injection".  Addendum:  prelim ultrasound result:  DVT noted in the right common femoral vein, right femoral vein, right profunda femoral vein, right popliteal vein, and right proximal peroneal veins. No DVT LLE. Consider heparin drip after paraspinal aspiration, will need to follow up on final ultrasound result to determined acute vs chronic clot, heparin drip vs ivc filter,  i have discussed with dr Alen Blew from hematoloyg/oncology, he will see patient today and leave recommendation.   Start heparin drip for now.  CKD IV: stable at baseline. Renal dosing meds. Will add sodium bicarb.  Stage Iv lung cancer: defer to oncology.   Anemia, likely anemia of chronic disease: close to baseline, no appatent blood loss, stool guaiac pending, urine looks clear. Monitor, transfuse prn to keep hgb>7.  CAD s/p stent in 2007, h/o chronic systolic chf: echo pending, no chest pain, no sob. Sinus rhythm  H/o HTN: has been taken off bp meds since 03/2015 due to progressive weakness and low bp.  Severe malnutrition: check prealbumin, nutrition consult.  FTT: over all poor prognosis, patient wants to be full code.    DVT prophylaxis: scd's, family states patient not able to tolerate lovenox injection  Consultants: oncology dr Benay Spice IR for paraspinous fluids collection drain ( talked To Dr. Kathlene Cote)  Code Status:  full , confirmed with patient and daughter  Family Communication: Patient and daughter   Disposition Plan: remain  in stepdown    Procedures: 1/14 CT guided aspiration of paraspinal fluid collection (lumbar region)  Antibiotics:  vanc/azetreonam   Objective: BP 111/52 mmHg  Pulse 78  Temp(Src) 97.5 F (36.4 C) (Oral)  Resp 11  Ht '5\' 8"'$  (1.727 m)  Wt 60 kg (132 lb 4.4 oz)  BMI 20.12 kg/m2  SpO2 99%  Intake/Output Summary (Last 24 hours) at 06/17/15 0803 Last data filed at 06/17/15 0645  Gross per 24 hour  Intake    600 ml  Output    475 ml  Net    125 ml   Filed Weights   06/16/15 1440 06/16/15 1940  Weight: 55.9 kg (123 lb 3.8 oz) 60 kg (132 lb 4.4 oz)    Exam:   General: Frail, hard of hearing, NAD  Eyes: PERRL  ENT: unremarkable  Neck: supple, no JVD  Cardiovascular: sinus tachycardia resolved, now NSR  Respiratory: diminished at basis, no rales, no wheezing, no rhonchi,  Abdomen: soft/ND/ND, positive bowel sounds  Skin: chronic sacral wound with scant clear drainage, unstageable, no apparent erythema, right lower leg pitting edema but no erythema, chronic right ankle/heal wound, scattered eccymosis  Musculoskeletal:right lower leg edema,  Bilateral pitting pedal edema  Psychiatric: calm/cooperative  Neurologic: overall frail, decreased muscle tone, but no apparent focal deficit   Data Reviewed: Basic Metabolic Panel:  Recent Labs Lab 06/16/15 0904 06/16/15 1710 06/17/15 0315  NA 138 132* 132*  K 3.7 3.8 3.7  CL  --  104 105  CO2 20* 18* 18*  GLUCOSE 83 173* 98  BUN 32.3* 32* 32*  CREATININE 2.0* 2.08* 2.00*  CALCIUM 7.2* 6.9* 6.8*   Liver Function Tests:  Recent Labs Lab 06/16/15 0904 06/16/15 1710 06/17/15 0315  AST '15 20 16  '$ ALT <9 11* 10*  ALKPHOS 164* 152* 134*  BILITOT 0.38 0.7 0.6  PROT 4.5* 4.3* 3.8*  ALBUMIN 1.5* 1.5* 1.3*   No results for input(s): LIPASE, AMYLASE in the last 168 hours. No results for input(s): AMMONIA in the last 168 hours. CBC:  Recent Labs Lab 06/16/15 0904 06/16/15 1710 06/17/15 0315  WBC 5.1  9.7 6.5  NEUTROABS 3.5 9.1*  --   HGB 9.1* 8.7* 7.4*  HCT 28.6* 27.8* 23.2*  MCV 85.0 86.9 86.2  PLT 215 173 128*   Cardiac Enzymes:   No results for input(s): CKTOTAL, CKMB, CKMBINDEX, TROPONINI in the last 168 hours. BNP (last 3 results) No results for input(s): BNP in the last 8760 hours.  ProBNP (last 3 results) No results for input(s): PROBNP in the last 8760 hours.  CBG: No results for input(s): GLUCAP in the last 168 hours.  Recent Results (from the past 240 hour(s))  Culture, blood (x 2)     Status: None (Preliminary result)   Collection Time: 06/16/15  4:25 PM  Result Value Ref Range Status   Specimen Description BLOOD RIGHT ARM  Final   Special Requests BOTTLES DRAWN AEROBIC AND ANAEROBIC  5CC, 4CC  Final   Culture  Setup Time   Final    GRAM NEGATIVE RODS IN BOTH AEROBIC AND ANAEROBIC BOTTLES CRITICAL RESULT CALLED TO, READ BACK BY AND VERIFIED WITH: Windell Norfolk AT [id number] ON [id number] BY Rhea Bleacher    Culture   Final    TOO YOUNG TO READ Performed at [facility]    Report Status PENDING  Incomplete  Culture, blood (x 2)     Status: None (Preliminary result)   Collection Time: 06/16/15  4:40 PM  Result Value Ref Range Status   Specimen Description BLOOD LEFT ARM  Final   Special Requests IN PEDIATRIC BOTTLE 0 .5CC  Final   Culture PENDING  Incomplete   Report Status PENDING  Incomplete  Wound culture     Status: None (Preliminary result)   Collection Time: 06/16/15  5:42 PM  Result Value Ref Range Status   Specimen Description FOOT RIGHT  Final   Special Requests NONE  Final   Gram Stain PENDING  Incomplete   Culture   Final    Culture reincubated for better growth Performed at [facility]    Report Status PENDING  Incomplete     Studies: Dg Chest Port 1 View  06/16/2015  CLINICAL DATA:  Sepsis. History of hypertension, coronary artery disease ischemic cardiomyopathy. History of carcinoma of the lung. EXAM: PORTABLE CHEST 1 VIEW  COMPARISON:  Chest CT, 06/14/2015.  Chest radiographs, 03/17/2015. FINDINGS: Opacity at the lung bases, obscuring hemidiaphragms, reflects the bilateral effusions and associated atelectasis noted on recent chest CT. No convincing pulmonary edema. Small spiculated nodule in the left upper lobe, measuring 1 cm on the prior CT, is again noted. No convincing pneumonia. No pulmonary edema. No pneumothorax. Right anterior chest wall Port-A-Cath is stable with its tip in the lower superior vena cava. Cardiac silhouette is normal in size. No convincing mediastinal or hilar masses or enlarged lymph nodes. IMPRESSION: 1. No significant change when compared to the recent chest CT. 2. Bilateral pleural effusions with associated lung base atelectasis. No convincing pneumonia or edema. 1 cm left upper lobe spiculated nodule. Electronically Signed   By: Lajean Manes M.D.   On: 06/16/2015 16:01   Dg Foot 2 Views Right  06/16/2015  CLINICAL DATA:  Ulcer to the right lateral aspect of the foot and ankle. Lung cancer. EXAM: RIGHT FOOT - 2 VIEW COMPARISON:  CT of the abdomen pelvis. FINDINGS: Marked osteopenia is present. There is diffuse heterogeneity of the bone likely reflecting metastases. Is extensive soft tissue swelling is present over the distal foot. No focal osseous destruction is present. The ulceration is not discretely visualized. IMPRESSION: 1. Moderate osteopenia with diffuse heterogeneity of the marrow. This likely represents osseous metastases. Similar appearance could be seen with severe osteopenia. 2. Extensive soft tissue swelling. This may represent edema or infection. There is no underlying bone destruction. Electronically Signed   By: San Morelle M.D.   On: 06/16/2015 16:04    Scheduled Meds: . aztreonam  1 g Intravenous Q8H  . collagenase  1 application Topical Daily  . pantoprazole  40 mg Oral Daily  . sodium chloride  3 mL Intravenous Q12H  . vancomycin  250 mg Oral 4 times per day  .  vancomycin  500 mg Intravenous Q24H    Continuous Infusions: . sodium chloride 50 mL/hr at 06/16/15 2000     Time spent: 35mns  Nakia Remmers MD, PhD  [facility] Pager [phone] If 7PM-7AM, please contact night-coverage at [email or web address], password [facility]1/14/2017, 8:03 AM  LOS: 1 day

## 2015-06-18 ENCOUNTER — Inpatient Hospital Stay (HOSPITAL_COMMUNITY): Payer: Medicare Other

## 2015-06-18 DIAGNOSIS — E440 Moderate protein-calorie malnutrition: Secondary | ICD-10-CM | POA: Diagnosis present

## 2015-06-18 DIAGNOSIS — I509 Heart failure, unspecified: Secondary | ICD-10-CM

## 2015-06-18 LAB — CBC
HEMATOCRIT: 23.7 % — AB (ref 36.0–46.0)
HEMOGLOBIN: 7.5 g/dL — AB (ref 12.0–15.0)
MCH: 27.3 pg (ref 26.0–34.0)
MCHC: 31.6 g/dL (ref 30.0–36.0)
MCV: 86.2 fL (ref 78.0–100.0)
Platelets: 148 10*3/uL — ABNORMAL LOW (ref 150–400)
RBC: 2.75 MIL/uL — ABNORMAL LOW (ref 3.87–5.11)
RDW: 14.9 % (ref 11.5–15.5)
WBC: 4.6 10*3/uL (ref 4.0–10.5)

## 2015-06-18 LAB — BASIC METABOLIC PANEL WITH GFR
Anion gap: 10 (ref 5–15)
BUN: 31 mg/dL — ABNORMAL HIGH (ref 6–20)
CO2: 19 mmol/L — ABNORMAL LOW (ref 22–32)
Calcium: 6.8 mg/dL — ABNORMAL LOW (ref 8.9–10.3)
Chloride: 107 mmol/L (ref 101–111)
Creatinine, Ser: 1.96 mg/dL — ABNORMAL HIGH (ref 0.44–1.00)
GFR calc Af Amer: 27 mL/min — ABNORMAL LOW
GFR calc non Af Amer: 23 mL/min — ABNORMAL LOW
Glucose, Bld: 74 mg/dL (ref 65–99)
Potassium: 3.9 mmol/L (ref 3.5–5.1)
Sodium: 136 mmol/L (ref 135–145)

## 2015-06-18 LAB — HEPARIN LEVEL (UNFRACTIONATED)
Heparin Unfractionated: 0.34 IU/mL (ref 0.30–0.70)
Heparin Unfractionated: 0.59 [IU]/mL (ref 0.30–0.70)
Heparin Unfractionated: 0.59 [IU]/mL (ref 0.30–0.70)

## 2015-06-18 LAB — PREALBUMIN: Prealbumin: <2 mg/dL — ABNORMAL LOW (ref 18–38)

## 2015-06-18 MED ORDER — HEPARIN (PORCINE) IN NACL 100-0.45 UNIT/ML-% IJ SOLN
800.0000 [IU]/h | INTRAMUSCULAR | Status: DC
  Administered 2015-06-18: 800 [IU]/h via INTRAVENOUS
  Filled 2015-06-18: qty 250

## 2015-06-18 MED ORDER — PRO-STAT SUGAR FREE PO LIQD
30.0000 mL | Freq: Two times a day (BID) | ORAL | Status: DC
  Administered 2015-06-18 – 2015-07-03 (×31): 30 mL via ORAL
  Filled 2015-06-18 (×32): qty 30

## 2015-06-18 MED ORDER — METOPROLOL TARTRATE 12.5 MG HALF TABLET
12.5000 mg | ORAL_TABLET | Freq: Two times a day (BID) | ORAL | Status: DC
  Administered 2015-06-18 – 2015-07-03 (×25): 12.5 mg via ORAL
  Filled 2015-06-18 (×32): qty 1

## 2015-06-18 MED ORDER — ZOLPIDEM TARTRATE 5 MG PO TABS
5.0000 mg | ORAL_TABLET | Freq: Every evening | ORAL | Status: DC | PRN
  Filled 2015-06-18 (×2): qty 1

## 2015-06-18 MED ORDER — GABAPENTIN 100 MG PO CAPS
100.0000 mg | ORAL_CAPSULE | Freq: Every day | ORAL | Status: DC
  Administered 2015-06-18 – 2015-07-02 (×15): 100 mg via ORAL
  Filled 2015-06-18 (×16): qty 1

## 2015-06-18 MED ORDER — HEPARIN (PORCINE) IN NACL 100-0.45 UNIT/ML-% IJ SOLN
900.0000 [IU]/h | INTRAMUSCULAR | Status: DC
  Filled 2015-06-18 (×2): qty 250

## 2015-06-18 NOTE — Progress Notes (Addendum)
PROGRESS NOTE  [name] BHA:[id number] DOB: Oct 24, 1933 DOA: 06/16/2015 PCP: Wende Neighbors, MD  HPI/Recap of past 24 hours:  Feeling better, bp started to be elevated, body temperature normalized,  smiling denies sob, reported chronic back pain and leg pain, , family not in room  Assessment/Plan: Active Problems:   Pressure ulcer   Non-small cell lung cancer ([place])   Paraspinal mass   Metastatic squamous cell carcinoma to bone (HCC)   Hypotension   Severe protein-calorie malnutrition ([place])   Hypothermia   Paraspinal abscess ([place])  Hypothermia/hypotension/sepsis on presentation ,  1/13 on admission: concerning of sepsis, with multiple possible source including sacral wound, right leg wound, paraspinal fluids collection, diarrhea. Sepsis order set placed, blood culture and wound culture obtained, awaiting cdiff/urine/mrsa screening sample collection, empirically start on vanc/aztreonam (h/o pcn,keflex,cipro and sulfa allergy)and oral vanc for now. ivf fluids. 1/14: bp and body temperature normalized  g- rod bacteremia: on azetreonam.   Paraspinal fluids collection: i have talked to IR Dr Kathlene Cote, CT guided aspiration of the paraspinal fluids with culture /gram stain/ cell count, cytology if possible  Sacral wound: chronic, per daughter, patient was recently released from the wound care center, has been getting wound care by home health RN. On visual inspection, wound appear to be deep, but with clean base, minimal clear scant drainage, no associated erythema, I have discussed Ct ab/pel obtained on 1/1with radiology Dr Kathlene Cote, he state the sacral wound seems to be superficial on CT scan, but there is a concern of the wound extend to the paraspinal fluids collection. I have discussed the case with general surgery Dr. Hassell Done on 1/15, he recommended conservative management for now, if patient become septic again, may consider formal general surgery consult.  Wound culture obtained,  wound care consulted.  right leg wound: right leg xray with extensive soft tissue swelling, edema vs infection, marked osteropenia? Osseous metastases.  wound culture, wound care. Lower extremity venous US ordered.  Diarrhea: check c diff, empirically start on oral vanc  Bilateral pleural effusion: currently on room air, denies sob, echocardiogram pending, monitor volume status, currently on ivf due to hypotension. CKD iii/IV; at baseline, renal dosing meds  Bilateral pedal edema, right lower extremity edema: venous US pending ,H/o bilateral lower extremity DVT: was on lovenox , per daughter lovenox discontinued  two months ago due to " patient is not able to tolerate injection".   Acute DVT:  right common femoral vein, right femoral vein, right profunda femoral vein, right popliteal vein, and right proximal peroneal veins. No DVT LLE. 1/14 Consider heparin drip after paraspinal aspiration, will need to follow up on final ultrasound result to determined acute vs chronic clot, heparin drip vs ivc filter,  i have discussed with dr Alen Blew from hematoloyg/oncology, he will see patient today and leave recommendation.  1/14 started heparin drip for now, monitor hgb and sign.    CKD IV: stable at baseline. Renal dosing meds. Will add sodium bicarb.  Stage Iv lung cancer: defer to oncology.   Anemia, likely anemia of chronic disease: close to baseline, no appatent blood loss, stool guaiac pending, urine looks clear. Monitor, transfuse prn to keep hgb>7.  CAD s/p stent in 2007, h/o chronic systolic chf: echo pending, no chest pain, no sob. Sinus rhythm  H/o HTN: has been taken off bp meds since 03/2015 due to progressive weakness and low bp. Now blood pressure started to be elevated, started low dose lopressor on 1/15  Severe malnutrition:  Prealbumin less  than 2, nutrition consult.  FTT: over all poor prognosis, patient wants to be full code.    DVT prophylaxis: scd's, family states  patient not able to tolerate lovenox injection  Consultants:  oncology Dr Benay Spice ( over the phone on 1/13) , Dr Alen Blew on 1/14, primary oncologist Dr. Julien Nordmann will return on Monday. IR for paraspinous fluids collection drain ( talked To Dr. Kathlene Cote)  Code Status: full , confirmed with patient and daughter  Family Communication: Patient and daughter   Disposition Plan: remain in stepdown, in continue to be stable, may transfer to floor on monday    Procedures: 1/14 CT guided aspiration of paraspinal fluid collection (lumbar region)  Antibiotics:  vanc/azetreonam   Objective: BP 160/78 mmHg  Pulse 80  Temp(Src) 97.7 F (36.5 C) (Oral)  Resp 19  Ht '5\' 8"'$  (1.727 m)  Wt 60 kg (132 lb 4.4 oz)  BMI 20.12 kg/m2  SpO2 96%  Intake/Output Summary (Last 24 hours) at 06/18/15 0806 Last data filed at 06/18/15 7673  Gross per 24 hour  Intake 1269.98 ml  Output    600 ml  Net 669.98 ml   Filed Weights   06/16/15 1440 06/16/15 1940  Weight: 55.9 kg (123 lb 3.8 oz) 60 kg (132 lb 4.4 oz)    Exam:   General: Frail, hard of hearing, NAD  Eyes: PERRL  ENT: unremarkable  Neck: supple, no JVD  Cardiovascular: sinus tachycardia resolved, now NSR  Respiratory: diminished at basis, no rales, no wheezing, no rhonchi,  Abdomen: soft/ND/ND, positive bowel sounds  Skin: chronic sacral wound with scant clear drainage, unstageable, no apparent erythema, right lower leg pitting edema but no erythema, chronic right ankle/heal wound, scattered eccymosis  Musculoskeletal:right lower leg edema,  Bilateral pitting pedal edema  Psychiatric: calm/cooperative  Neurologic: overall frail, decreased muscle tone, but no apparent focal deficit   Data Reviewed: Basic Metabolic Panel:  Recent Labs Lab 06/16/15 0904 06/16/15 1710 06/17/15 0315 06/18/15 0430  NA 138 132* 132* 136  K 3.7 3.8 3.7 3.9  CL  --  104 105 107  CO2 20* 18* 18* 19*  GLUCOSE 83 173* 98 74  BUN 32.3*  32* 32* 31*  CREATININE 2.0* 2.08* 2.00* 1.96*  CALCIUM 7.2* 6.9* 6.8* 6.8*   Liver Function Tests:  Recent Labs Lab 06/16/15 0904 06/16/15 1710 06/17/15 0315  AST '15 20 16  '$ ALT <9 11* 10*  ALKPHOS 164* 152* 134*  BILITOT 0.38 0.7 0.6  PROT 4.5* 4.3* 3.8*  ALBUMIN 1.5* 1.5* 1.3*   No results for input(s): LIPASE, AMYLASE in the last 168 hours. No results for input(s): AMMONIA in the last 168 hours. CBC:  Recent Labs Lab 06/16/15 0904 06/16/15 1710 06/17/15 0315 06/18/15 0430  WBC 5.1 9.7 6.5 4.6  NEUTROABS 3.5 9.1*  --   --   HGB 9.1* 8.7* 7.4* 7.5*  HCT 28.6* 27.8* 23.2* 23.7*  MCV 85.0 86.9 86.2 86.2  PLT 215 173 128* 148*   Cardiac Enzymes:   No results for input(s): CKTOTAL, CKMB, CKMBINDEX, TROPONINI in the last 168 hours. BNP (last 3 results) No results for input(s): BNP in the last 8760 hours.  ProBNP (last 3 results) No results for input(s): PROBNP in the last 8760 hours.  CBG: No results for input(s): GLUCAP in the last 168 hours.  Recent Results (from the past 240 hour(s))  Culture, blood (x 2)     Status: None (Preliminary result)   Collection Time: 06/16/15  4:25 PM  Result  Value Ref Range Status   Specimen Description BLOOD RIGHT ARM  Final   Special Requests BOTTLES DRAWN AEROBIC AND ANAEROBIC  5CC, 4CC  Final   Culture  Setup Time   Final    GRAM NEGATIVE RODS IN BOTH AEROBIC AND ANAEROBIC BOTTLES CRITICAL RESULT CALLED TO, READ BACK BY AND VERIFIED WITH: Windell Norfolk AT [id number] ON [id number] BY Rhea Bleacher    Culture   Final    TOO YOUNG TO READ Performed at [facility]    Report Status PENDING  Incomplete  Culture, blood (x 2)     Status: None (Preliminary result)   Collection Time: 06/16/15  4:40 PM  Result Value Ref Range Status   Specimen Description BLOOD LEFT ARM  Final   Special Requests IN PEDIATRIC BOTTLE 0 .5CC  Final   Culture   Final    NO GROWTH < 24 HOURS Performed at [facility]    Report Status PENDING   Incomplete  Wound culture     Status: None (Preliminary result)   Collection Time: 06/16/15  5:40 PM  Result Value Ref Range Status   Specimen Description SACRAL  Final   Special Requests NONE  Final   Gram Stain PENDING  Incomplete   Culture   Final    FEW STAPHYLOCOCCUS AUREUS Note: RIFAMPIN AND GENTAMICIN SHOULD NOT BE USED AS SINGLE DRUGS FOR TREATMENT OF STAPH INFECTIONS. Performed at [facility]    Report Status PENDING  Incomplete  Wound culture     Status: None (Preliminary result)   Collection Time: 06/16/15  5:42 PM  Result Value Ref Range Status   Specimen Description FOOT RIGHT  Final   Special Requests NONE  Final   Gram Stain PENDING  Incomplete   Culture   Final    Culture reincubated for better growth Performed at [facility]    Report Status PENDING  Incomplete  MRSA PCR Screening     Status: None   Collection Time: 06/17/15 10:10 AM  Result Value Ref Range Status   MRSA by PCR NEGATIVE NEGATIVE Final    Comment:        The GeneXpert MRSA Assay (FDA approved for NASAL specimens only), is one component of a comprehensive MRSA colonization surveillance program. It is not intended to diagnose MRSA infection nor to guide or monitor treatment for MRSA infections.      Studies: Ct Aspiration  06/17/2015  CLINICAL DATA:  Metastatic lung carcinoma post chemotherapy and radiation therapy. Recent CT demonstrated new small right paraspinal collection containing gas and fluid. EXAM: CT GUIDED ASPIRATION BIOPSY OF RIGHT PARASPINAL COLLECTION ANESTHESIA/SEDATION: Intravenous Fentanyl and Versed were administered as conscious sedation during continuous cardiorespiratory monitoring by the radiology RN, with a total moderate sedation time of 6 minutes. PROCEDURE: The procedure risks, benefits, and alternatives were explained to the patient. Questions regarding the procedure were encouraged and answered. The patient understands and consents to the  procedure. Patient placed in left lateral decubitus positioning. Select axial scans through the lower abdomen performed. The collection was localized and an appropriate skin entry site was determined and marked. The operative field was prepped with Betadinein a sterile fashion, and a sterile drape was applied covering the operative field. A sterile gown and sterile gloves were used for the procedure. Local anesthesia was provided with 1% Lidocaine. Under CT fluoroscopic guidance, 18 gauge percutaneous entry needle was advanced to the collection. Once needle tip position was confirmed, aspiration returned to mild purulent  material. This was sent for routine Gram stain, culture and sensitivity. Postprocedure scan shows no hemorrhage or other apparent complication. The patient tolerated the procedure well. COMPLICATIONS: None immediate FINDINGS: Limited CT again demonstrates right paraspinal the collection containing gas and fluid. CT-guided aspiration was performed, the aspirate sent for Gram stain and culture. IMPRESSION: 1. Technically successful CT-guided aspiration of right paraspinal collection. Electronically Signed   By: Lucrezia Europe M.D.   On: 06/17/2015 13:55    Scheduled Meds: . aztreonam  1 g Intravenous Q8H  . collagenase  1 application Topical Daily  . pantoprazole  40 mg Oral Daily  . sodium bicarbonate  650 mg Oral BID  . sodium chloride  3 mL Intravenous Q12H  . vancomycin  250 mg Oral 4 times per day  . vancomycin  500 mg Intravenous Q24H    Continuous Infusions: . sodium chloride 50 mL/hr at 06/17/15 1433  . heparin 950 Units/hr (06/17/15 2138)     Time spent: 2mns  Kassidee Narciso MD, PhD  [facility] Pager [phone] If 7PM-7AM, please contact night-coverage at [email or web address], password [email or web address]1/15/2017, 8:06 AM  LOS: 2 days

## 2015-06-18 NOTE — Progress Notes (Signed)
ANTICOAGULATION CONSULT NOTE - F/u Consult  Pharmacy Consult for heparin Indication: DVT  Allergies  Allergen Reactions  . Amlodipine Swelling  . Ciprofloxacin Hives and Other (See Comments)    REACTION: weakness  . Mirtazapine Other (See Comments)    Hallucinations and nightmares, verbally aggressive   . Statins Other (See Comments)  . Aspirin Swelling    Mouth swelling, tongue  . Cephalexin Hives  . Hydralazine Nausea And Vomiting  . Iron Nausea And Vomiting  . Lorazepam Other (See Comments)    Hallucinations, verbally aggressive  . Sulfonamide Derivatives Hives  . Penicillins Other (See Comments)    Has patient had a PCN reaction causing immediate rash, facial/tongue/throat swelling, SOB or lightheadedness with hypotension: Yes Has patient had a PCN reaction causing severe rash involving mucus membranes or skin necrosis: Yes Has patient had a PCN reaction that required hospitalization No Has patient had a PCN reaction occurring within the last 10 years: No If all of the above answers are "NO", then may proceed with Cephalosporin use.   . Shellfish Allergy Hives and Rash  . Tape Rash    Patient Measurements: Height: '5\' 8"'$  (172.7 cm) Weight: 132 lb 4.4 oz (60 kg) IBW/kg (Calculated) : 63.9 Heparin Dosing Weight:   Vital Signs: Temp: 97.5 F (36.4 C) (01/15 0000) Temp Source: Oral (01/15 0000) BP: 160/78 mmHg (01/15 0400) Pulse Rate: 80 (01/15 0500)  Labs:  Recent Labs  06/16/15 1710 06/17/15 0315 06/18/15 0430 06/18/15 0438  HGB 8.7* 7.4* 7.5*  --   HCT 27.8* 23.2* 23.7*  --   PLT 173 128* 148*  --   APTT 42*  --   --   --   LABPROT 15.3*  --   --   --   INR 1.19  --   --   --   HEPARINUNFRC  --   --   --  0.34  CREATININE 2.08* 2.00* 1.96*  --     Estimated Creatinine Clearance: 22 mL/min (by C-G formula based on Cr of 1.96).   Medical History: Past Medical History  Diagnosis Date  . Hypertension   . Hypercholesterolemia   . CKD (chronic kidney  disease)   . Coronary artery disease     a.  LHC (06/04/05): LHC done in [place] with high grade RCA => s/p BMS to RCA;  b.  Nuclear (09/14/09): Lexiscan; Inf infarct with mild peri-infarct ishemia, EF 52%; Low Risk.  Marland Kitchen GERD (gastroesophageal reflux disease)   . Chronic anemia   . Ischemic cardiomyopathy     a. Echo (07/26/13): Mild LVH, EF 35-40%, diff HK, inf AK, Gr 2 DD, Tr AI, mildly dilated Ao root, MAC, mild MR, mild LAE, mod reduced RVSF.  . Non-small cell carcinoma of lung ([place])     Stage IV  . Chronic fatigue 04/04/2015  . Chronic fatigue 04/04/2015    Medications:  Scheduled:  . aztreonam  1 g Intravenous Q8H  . collagenase  1 application Topical Daily  . pantoprazole  40 mg Oral Daily  . sodium bicarbonate  650 mg Oral BID  . sodium chloride  3 mL Intravenous Q12H  . vancomycin  250 mg Oral 4 times per day  . vancomycin  500 mg Intravenous Q24H    Assessment: Pt is a 82 yo female diagnosed with DVT noted in the right common femoral vein, right femoral vein, right profunda femoral vein, right popliteal vein, and right proximal peroneal veins. Pt was previously on enoxaparin PTA but has  not been on medication for greater than 30 days.    On consultation with Dr. Erlinda Hong, due to concern of dropping Hgb levels, omit heparin bolus. Also, MD would like a lower threshold for anticoagulation level. Therefore, target anti-Xa is to be 0.3 to 0.5.   Today, 06/18/15 0438 HL=0.34, no problems reported per RN ( at desired goal)  Goal of Therapy:  Heparin level 0.3 to 0.5 Monitor platelets by anticoagulation protocol: Yes   Plan:  Continue heparin drip @ 950 units/hr Recheck HL @ 1300 today   Dorrene German 06/18/2015 5:40 AM

## 2015-06-18 NOTE — Progress Notes (Signed)
Initial Nutrition Assessment  DOCUMENTATION CODES:   Non-severe (moderate) malnutrition in context of chronic illness  INTERVENTION:   Provide Prostat liquid protein PO 30 ml BID with meals, each supplement provides 100 kcal, 15 grams protein. Encourage PO intake RD to continue to monitor  NUTRITION DIAGNOSIS:   Increased nutrient needs related to wound healing as evidenced by estimated needs.  GOAL:   Patient will meet greater than or equal to 90% of their needs  MONITOR:   PO intake, Supplement acceptance, Labs, Weight trends, Skin, I & O's  REASON FOR ASSESSMENT:   Consult Assessment of nutrition requirement/status  ASSESSMENT:   82 y.o . patient With h/o htn, cad s/o stent in 2007, h/o chronic systolic chf, not a cnadidate for ace/arb due to ckd IV, she has been taken off blood pressure meds in 03/2015 due to progressive weakness and low bp, h/o stage IV NSCLC initially diagnosed in 2007, with disease recurrent in 11/2014, s/p XRT to right paraspinous mass with osseous invasion at l5 , finished on 12/23/2014, she was then treated with systemic chemo with [place] and paclitaxel which was stopped after two cycles due to intolerance, and mild disease progression, she is currently uncer immunotherapy with PD1 inhibitor, last on 12/23. Today she is sent from oncology clinic Due to hypothermia and hypotension, reported has been having diarrhea for the last two weeks, denies ab pain, no n/v, she has a chronic sacral decubitus ulcer and chronic right lower leg wound which are care by home health RN.   Pt in room with no family present. Per pt, she has been eating better than previous admission in October. Pt ate all of her omelette this morning. Pt does have multiple wounds and is still treating her cancer which increases her needs. Pt has gained back some weight over the last couple of months (unsure if this is d/t her improved PO or fluid gain). Pt reports not tolerating Ensure  supplements which she does like but it causes loose stools. She is willing to try Prostat supplements. RD to order.  Nutrition-Focused physical exam completed. Findings are mild fat depletion, moderate muscle depletion, and moderate-severe edema. Edema may be masking some depletion in extremities.  Labs reviewed: Elevated BUN & Creatinine  Diet Order:  Diet regular Room service appropriate?: Yes; Fluid consistency:: Thin  Skin:  Wound (see comment) (Stg IV sacral ulcer, Stg III rt hip ulcer, deep tissue injury on ankle)  Last BM:  1/13  Height:   Ht Readings from Last 1 Encounters:  06/16/15 '5\' 8"'$  (1.727 m)    Weight:   Wt Readings from Last 1 Encounters:  06/16/15 132 lb 4.4 oz (60 kg)    Ideal Body Weight:  63.6 kg  BMI:  Body mass index is 20.12 kg/(m^2).  Estimated Nutritional Needs:   Kcal:  1800-2000  Protein:  85-95g  Fluid:  2L/day  EDUCATION NEEDS:   Education needs addressed  Clayton Bibles, MS, RD, LDN Pager: [phone] After Hours Pager: [phone]

## 2015-06-18 NOTE — Evaluation (Signed)
Physical Therapy Evaluation Patient Details Name: [name] MRN: [id number] DOB: 15-Mar-1934 Today's Date: 06/18/2015   History of Present Illness   82 yo female adm with abd pain  on  06/16/15 with  PMHx: stage IV NSCLC initially diagnosed in 2007, with disease recurrent in 11/2014, s/p XRT to right paraspinous mass with osseous invasion at L5, HTN , cad and  stent in 4627, chronic systolic CHF,  sacral ulcer and right  lateral mallelous wound   Clinical Impression  Pt admitted with above diagnosis. Pt currently with functional limitations due to the deficits listed below (see PT Problem List).  Pt will benefit from skilled PT to increase their independence and safety with mobility to allow discharge to the venue listed below.   Pt initially agreed and then declined sitting EOB; pt reports that she assists with transfers at home however last time I saw her she reported her grandsons "picked me up"; will follow for 1 wk trial     Follow Up Recommendations Home health PT;Other (comment)    Equipment Recommendations   [facility] for w/c seating eval/w/c cushion for pressure relief)    Recommendations for Other Services       Precautions / Restrictions Precautions Precautions: Fall      Mobility  Bed Mobility               General bed mobility comments: pt declined after initally agreeing to sit EOB  Transfers                    Ambulation/Gait                Stairs            Wheelchair Mobility    Modified Rankin (Stroke Patients Only)       Balance                                             Pertinent Vitals/Pain Pain Assessment: No/denies pain    Home Living Family/patient expects to be discharged to:: Private residence Living Arrangements: Children;Other (Comment) Available Help at Discharge: Family;Friend(s);Available 24 hours/day;Personal care attendant (pt reports she has nursing care 24/7)   Home Access: Ramped  entrance     [place]: One level        Prior Function Level of Independence: Needs assistance   Gait / Transfers Assistance Needed: pt reports she assisted with stand pivot transfers but cannot do them I'ly     Comments: also has HHRN for wound care; pt reports she was getting HHPT working on LE strength and transfers (?)      Hand Dominance        Extremity/Trunk Assessment   Upper Extremity Assessment: RUE deficits/detail;LUE deficits/detail RUE Deficits / Details: grossly WFL     LUE Deficits / Details: grossly 3/5    Lower Extremity Assessment: RLE deficits/detail;LLE deficits/detail RLE Deficits / Details: ankle pf contracture, knee A/AROM to 0 to 60* LLE Deficits / Details: AROM grossly WFL, generalized weakness     Communication   Communication: No difficulties  Cognition Arousal/Alertness: Awake/alert Behavior During Therapy: WFL for tasks assessed/performed Overall Cognitive Status: Within Functional Limits for tasks assessed                      General Comments      Exercises  Assessment/Plan    PT Assessment Patient needs continued PT services  PT Diagnosis Difficulty walking   PT Problem List Decreased activity tolerance;Decreased mobility;Decreased strength  PT Treatment Interventions Functional mobility training;Therapeutic activities;Therapeutic exercise   PT Goals (Current goals can be found in the Care Plan section) Acute Rehab PT Goals Patient Stated Goal: none stated PT Goal Formulation: With patient Time For Goal Achievement: 06/25/15 Potential to Achieve Goals: Fair    Frequency Min 2X/week   Barriers to discharge        Co-evaluation               End of Session   Activity Tolerance: Other (comment) (pt declined mobility after initally agreeing to sit up) Patient left: in bed;with call bell/phone within reach Nurse Communication: Mobility status         Time: 6834-1962 PT Time Calculation  (min) (ACUTE ONLY): 10 min   Charges:   PT Evaluation $PT Eval Low Complexity: 1 Procedure     PT G Codes:        [facility] 01-Jul-2015, 12:48 PM

## 2015-06-18 NOTE — Progress Notes (Signed)
ANTICOAGULATION CONSULT NOTE - F/u Consult  Pharmacy Consult for heparin Indication: DVT  Allergies  Allergen Reactions  . Amlodipine Swelling  . Ciprofloxacin Hives and Other (See Comments)    REACTION: weakness  . Mirtazapine Other (See Comments)    Hallucinations and nightmares, verbally aggressive   . Statins Other (See Comments)  . Aspirin Swelling    Mouth swelling, tongue  . Cephalexin Hives  . Hydralazine Nausea And Vomiting  . Iron Nausea And Vomiting  . Lorazepam Other (See Comments)    Hallucinations, verbally aggressive  . Sulfonamide Derivatives Hives  . Penicillins Other (See Comments)    Has patient had a PCN reaction causing immediate rash, facial/tongue/throat swelling, SOB or lightheadedness with hypotension: Yes Has patient had a PCN reaction causing severe rash involving mucus membranes or skin necrosis: Yes Has patient had a PCN reaction that required hospitalization No Has patient had a PCN reaction occurring within the last 10 years: No If all of the above answers are "NO", then may proceed with Cephalosporin use.   . Shellfish Allergy Hives and Rash  . Tape Rash    Patient Measurements: Height: '5\' 8"'$  (172.7 cm) Weight: 132 lb 4.4 oz (60 kg) IBW/kg (Calculated) : 63.9 Heparin Dosing Weight: using actual body weight of 60 kg  Vital Signs: Temp: 98.8 F (37.1 C) (01/15 2030) Temp Source: Axillary (01/15 2030) BP: 126/48 mmHg (01/15 2000) Pulse Rate: 79 (01/15 2000)  Labs:  Recent Labs  06/16/15 1710 06/17/15 0315 06/18/15 0430 06/18/15 0438 06/18/15 1255 06/18/15 2230  HGB 8.7* 7.4* 7.5*  --   --   --   HCT 27.8* 23.2* 23.7*  --   --   --   PLT 173 128* 148*  --   --   --   APTT 42*  --   --   --   --   --   LABPROT 15.3*  --   --   --   --   --   INR 1.19  --   --   --   --   --   HEPARINUNFRC  --   --   --  0.34 0.59 0.59  CREATININE 2.08* 2.00* 1.96*  --   --   --     Estimated Creatinine Clearance: 22 mL/min (by C-G formula  based on Cr of 1.96).   Medical History: Past Medical History  Diagnosis Date  . Hypertension   . Hypercholesterolemia   . CKD (chronic kidney disease)   . Coronary artery disease     a.  LHC (06/04/05): LHC done in [place] with high grade RCA => s/p BMS to RCA;  b.  Nuclear (09/14/09): Lexiscan; Inf infarct with mild peri-infarct ishemia, EF 52%; Low Risk.  Marland Kitchen GERD (gastroesophageal reflux disease)   . Chronic anemia   . Ischemic cardiomyopathy     a. Echo (07/26/13): Mild LVH, EF 35-40%, diff HK, inf AK, Gr 2 DD, Tr AI, mildly dilated Ao root, MAC, mild MR, mild LAE, mod reduced RVSF.  . Non-small cell carcinoma of lung ([place])     Stage IV  . Chronic fatigue 04/04/2015  . Chronic fatigue 04/04/2015    Medications:  Scheduled:  . aztreonam  1 g Intravenous Q8H  . collagenase  1 application Topical Daily  . feeding supplement (PRO-STAT SUGAR FREE 64)  30 mL Oral BID  . gabapentin  100 mg Oral QHS  . metoprolol tartrate  12.5 mg Oral BID  . pantoprazole  40 mg Oral Daily  . sodium bicarbonate  650 mg Oral BID  . sodium chloride  3 mL Intravenous Q12H  . vancomycin  250 mg Oral 4 times per day  . vancomycin  500 mg Intravenous Q24H    Assessment: Pt is a 82 yo female diagnosed with DVT noted in the right common femoral vein, right femoral vein, right profunda femoral vein, right popliteal vein, and right proximal peroneal veins. Pt was previously on enoxaparin PTA but has not been on medication for greater than 30 days. Pharmacy consulted to being heparin infusion 1/14 PM.  Significant Events: 1/14 am paraspinal fluid aspiration by IR 1/14 pm heparin infusion initiated  On consultation with Dr. Erlinda Hong, due to concern of dropping Hgb levels, omit heparin bolus. Also, MD would like a lower threshold for anticoagulation level. Therefore, target anti-Xa is to be 0.3 to 0.5.   Today, 06/18/2015   0438 HL=0.34, no problems reported per RN (at desired goal) with infusion at 950  units/hr  1255 HL=0.59, increased despite no adjustments to infusion rate  CBC: Hgb 7.5, Platelets 148. No bleeding/complications reported.   SCr 1.96, CrCl~22 ml/min  2230 HL=0.59 units/ml on 900 units/hr, infusion was changed/stopped briefly due to IV line leaking and no bleeding per RN  Goal of Therapy:  Heparin level 0.3 to 0.5 Monitor platelets by anticoagulation protocol: Yes   Plan:  1.  Reduce heparin infusion to 800 units/hr to keep within more narrow therapeutic window of 0.3-0.5 units/ml. 2.  Check heparin level 8 hours after rate change. 3.  Daily heparin level and CBC while on heparin infusion. 4.  F/u plan for long term anticoagulation vs IVC filter placement.   Dorrene German 06/18/2015 11:11 PM

## 2015-06-18 NOTE — Progress Notes (Signed)
Echocardiogram 2D Echocardiogram has been performed.  Aggie Cosier 06/18/2015, 8:38 AM

## 2015-06-18 NOTE — Progress Notes (Signed)
ANTICOAGULATION CONSULT NOTE - F/u Consult  Pharmacy Consult for heparin Indication: DVT  Allergies  Allergen Reactions  . Amlodipine Swelling  . Ciprofloxacin Hives and Other (See Comments)    REACTION: weakness  . Mirtazapine Other (See Comments)    Hallucinations and nightmares, verbally aggressive   . Statins Other (See Comments)  . Aspirin Swelling    Mouth swelling, tongue  . Cephalexin Hives  . Hydralazine Nausea And Vomiting  . Iron Nausea And Vomiting  . Lorazepam Other (See Comments)    Hallucinations, verbally aggressive  . Sulfonamide Derivatives Hives  . Penicillins Other (See Comments)    Has patient had a PCN reaction causing immediate rash, facial/tongue/throat swelling, SOB or lightheadedness with hypotension: Yes Has patient had a PCN reaction causing severe rash involving mucus membranes or skin necrosis: Yes Has patient had a PCN reaction that required hospitalization No Has patient had a PCN reaction occurring within the last 10 years: No If all of the above answers are "NO", then may proceed with Cephalosporin use.   . Shellfish Allergy Hives and Rash  . Tape Rash    Patient Measurements: Height: '5\' 8"'$  (172.7 cm) Weight: 132 lb 4.4 oz (60 kg) IBW/kg (Calculated) : 63.9 Heparin Dosing Weight: using actual body weight of 60 kg  Vital Signs: Temp: 97.4 F (36.3 C) (01/15 0800) Temp Source: Oral (01/15 0800) BP: 107/51 mmHg (01/15 1200) Pulse Rate: 65 (01/15 1200)  Labs:  Recent Labs  06/16/15 1710 06/17/15 0315 06/18/15 0430 06/18/15 0438 06/18/15 1255  HGB 8.7* 7.4* 7.5*  --   --   HCT 27.8* 23.2* 23.7*  --   --   PLT 173 128* 148*  --   --   APTT 42*  --   --   --   --   LABPROT 15.3*  --   --   --   --   INR 1.19  --   --   --   --   HEPARINUNFRC  --   --   --  0.34 0.59  CREATININE 2.08* 2.00* 1.96*  --   --     Estimated Creatinine Clearance: 22 mL/min (by C-G formula based on Cr of 1.96).   Medical History: Past Medical  History  Diagnosis Date  . Hypertension   . Hypercholesterolemia   . CKD (chronic kidney disease)   . Coronary artery disease     a.  LHC (06/04/05): LHC done in [place] with high grade RCA => s/p BMS to RCA;  b.  Nuclear (09/14/09): Lexiscan; Inf infarct with mild peri-infarct ishemia, EF 52%; Low Risk.  Marland Kitchen GERD (gastroesophageal reflux disease)   . Chronic anemia   . Ischemic cardiomyopathy     a. Echo (07/26/13): Mild LVH, EF 35-40%, diff HK, inf AK, Gr 2 DD, Tr AI, mildly dilated Ao root, MAC, mild MR, mild LAE, mod reduced RVSF.  . Non-small cell carcinoma of lung ([place])     Stage IV  . Chronic fatigue 04/04/2015  . Chronic fatigue 04/04/2015    Medications:  Scheduled:  . aztreonam  1 g Intravenous Q8H  . collagenase  1 application Topical Daily  . feeding supplement (PRO-STAT SUGAR FREE 64)  30 mL Oral BID  . metoprolol tartrate  12.5 mg Oral BID  . pantoprazole  40 mg Oral Daily  . sodium bicarbonate  650 mg Oral BID  . sodium chloride  3 mL Intravenous Q12H  . vancomycin  250 mg Oral 4 times per  day  . vancomycin  500 mg Intravenous Q24H    Assessment: Pt is a 82 yo female diagnosed with DVT noted in the right common femoral vein, right femoral vein, right profunda femoral vein, right popliteal vein, and right proximal peroneal veins. Pt was previously on enoxaparin PTA but has not been on medication for greater than 30 days. Pharmacy consulted to being heparin infusion 1/14 PM.  Significant Events: 1/14 am paraspinal fluid aspiration by IR 1/14 pm heparin infusion initiated  On consultation with Dr. Erlinda Hong, due to concern of dropping Hgb levels, omit heparin bolus. Also, MD would like a lower threshold for anticoagulation level. Therefore, target anti-Xa is to be 0.3 to 0.5.   Today, 06/18/2015   0438 HL=0.34, no problems reported per RN (at desired goal) with infusion at 950 units/hr  1255 HL=0.59, increased despite no adjustments to infusion rate  CBC: Hgb 7.5,  Platelets 148. No bleeding/complications reported.   SCr 1.96, CrCl~22 ml/min  Goal of Therapy:  Heparin level 0.3 to 0.5 Monitor platelets by anticoagulation protocol: Yes   Plan:  1.  Reduce heparin infusion to 900 units/hr to keep within more narrow therapeutic window of 0.5-0.5 units/ml. 2.  Check heparin level 8 hours after rate change. 3.  Daily heparin level and CBC while on heparin infusion. 4.  F/u plan for long term anticoagulation vs IVC filter placement.   Hershal Coria 06/18/2015 2:06 PM

## 2015-06-19 ENCOUNTER — Other Ambulatory Visit: Payer: Self-pay

## 2015-06-19 DIAGNOSIS — C7951 Secondary malignant neoplasm of bone: Secondary | ICD-10-CM

## 2015-06-19 DIAGNOSIS — E440 Moderate protein-calorie malnutrition: Secondary | ICD-10-CM

## 2015-06-19 DIAGNOSIS — C3490 Malignant neoplasm of unspecified part of unspecified bronchus or lung: Secondary | ICD-10-CM

## 2015-06-19 DIAGNOSIS — N189 Chronic kidney disease, unspecified: Secondary | ICD-10-CM

## 2015-06-19 DIAGNOSIS — Z86718 Personal history of other venous thrombosis and embolism: Secondary | ICD-10-CM

## 2015-06-19 DIAGNOSIS — E46 Unspecified protein-calorie malnutrition: Secondary | ICD-10-CM

## 2015-06-19 DIAGNOSIS — L02212 Cutaneous abscess of back [any part, except buttock]: Secondary | ICD-10-CM

## 2015-06-19 DIAGNOSIS — R609 Edema, unspecified: Secondary | ICD-10-CM

## 2015-06-19 DIAGNOSIS — I959 Hypotension, unspecified: Secondary | ICD-10-CM

## 2015-06-19 DIAGNOSIS — A419 Sepsis, unspecified organism: Secondary | ICD-10-CM | POA: Diagnosis present

## 2015-06-19 LAB — CBC
HCT: 22.7 % — ABNORMAL LOW (ref 36.0–46.0)
Hemoglobin: 7.2 g/dL — ABNORMAL LOW (ref 12.0–15.0)
MCH: 27.4 pg (ref 26.0–34.0)
MCHC: 31.7 g/dL (ref 30.0–36.0)
MCV: 86.3 fL (ref 78.0–100.0)
PLATELETS: 157 10*3/uL (ref 150–400)
RBC: 2.63 MIL/uL — ABNORMAL LOW (ref 3.87–5.11)
RDW: 15.2 % (ref 11.5–15.5)
WBC: 4.7 10*3/uL (ref 4.0–10.5)

## 2015-06-19 LAB — URINE CULTURE: Culture: >=100000

## 2015-06-19 LAB — BASIC METABOLIC PANEL
Anion gap: 6 (ref 5–15)
BUN: 35 mg/dL — ABNORMAL HIGH (ref 6–20)
CALCIUM: 7 mg/dL — AB (ref 8.9–10.3)
CHLORIDE: 113 mmol/L — AB (ref 101–111)
CO2: 20 mmol/L — ABNORMAL LOW (ref 22–32)
CREATININE: 1.84 mg/dL — AB (ref 0.44–1.00)
GFR calc Af Amer: 29 mL/min — ABNORMAL LOW (ref >60)
GFR calc non Af Amer: 25 mL/min — ABNORMAL LOW (ref >60)
Glucose, Bld: 82 mg/dL (ref 65–99)
Potassium: 3.7 mmol/L (ref 3.5–5.1)
SODIUM: 139 mmol/L (ref 135–145)

## 2015-06-19 LAB — C DIFFICILE QUICK SCREEN W PCR REFLEX
C Diff antigen: NEGATIVE
C Diff interpretation: NEGATIVE
C Diff toxin: NEGATIVE

## 2015-06-19 LAB — CULTURE, BLOOD (ROUTINE X 2)

## 2015-06-19 LAB — WOUND CULTURE: Gram Stain: NONE SEEN

## 2015-06-19 LAB — HEPARIN LEVEL (UNFRACTIONATED)
HEPARIN UNFRACTIONATED: 0.52 [IU]/mL (ref 0.30–0.70)
HEPARIN UNFRACTIONATED: 0.49 [IU]/mL (ref 0.30–0.70)

## 2015-06-19 LAB — OCCULT BLOOD X 1 CARD TO LAB, STOOL: Fecal Occult Bld: POSITIVE — AB

## 2015-06-19 MED ORDER — FUROSEMIDE 10 MG/ML IJ SOLN
40.0000 mg | Freq: Once | INTRAMUSCULAR | Status: AC
  Administered 2015-06-19: 40 mg via INTRAVENOUS
  Filled 2015-06-19: qty 4

## 2015-06-19 MED ORDER — SODIUM CHLORIDE 0.9 % IV SOLN
INTRAVENOUS | Status: DC
  Administered 2015-06-19: 10 mL/h via INTRAVENOUS
  Administered 2015-06-23: 10:00:00 via INTRAVENOUS

## 2015-06-19 MED ORDER — SODIUM CHLORIDE 0.9 % IV SOLN
250.0000 mg | Freq: Three times a day (TID) | INTRAVENOUS | Status: DC
  Administered 2015-06-19 – 2015-06-26 (×20): 250 mg via INTRAVENOUS
  Filled 2015-06-19 (×21): qty 250

## 2015-06-19 MED ORDER — SODIUM CHLORIDE 0.9 % IJ SOLN
10.0000 mL | Freq: Two times a day (BID) | INTRAMUSCULAR | Status: DC
  Administered 2015-06-19 – 2015-06-29 (×9): 10 mL

## 2015-06-19 MED ORDER — DIPHENHYDRAMINE HCL 50 MG PO CAPS
50.0000 mg | ORAL_CAPSULE | Freq: Once | ORAL | Status: DC
  Filled 2015-06-19: qty 1

## 2015-06-19 MED ORDER — SODIUM CHLORIDE 0.9 % IJ SOLN
10.0000 mL | INTRAMUSCULAR | Status: DC | PRN
  Administered 2015-06-20 – 2015-07-03 (×10): 10 mL
  Filled 2015-06-19 (×10): qty 40

## 2015-06-19 MED ORDER — HEPARIN (PORCINE) IN NACL 100-0.45 UNIT/ML-% IJ SOLN
1000.0000 [IU]/h | INTRAMUSCULAR | Status: DC
  Administered 2015-06-21 – 2015-06-23 (×2): 750 [IU]/h via INTRAVENOUS
  Administered 2015-06-24: 1000 [IU]/h via INTRAVENOUS
  Filled 2015-06-19 (×8): qty 250

## 2015-06-19 NOTE — Progress Notes (Signed)
Spoke with Lanelle Bal, RN from infection control who agreed that pt could be taken off of enteric precautions at this point because the patient has not had loose stool or diarrhea since admission and the C Diff order for collection had expired 17 hr 45 min ago. Will continue to monitor patient and follow up with MD.

## 2015-06-19 NOTE — Care Management Note (Signed)
Case Management Note  Patient Details  Name: [name] MRN: [id number] Date of Birth: 02/19/1934  Subjective/Objective:         Sepsis with elevated lactic acid            Action/Plan: Date: June 19, 2015 Chart reviewed for concurrent status and case management needs. Will continue to follow patient for changes and needs: Velva Harman, RN, BSN, [place]   [phone]   Expected Discharge Date:   (unknown)               Expected Discharge Plan:  Home/Self Care  In-House Referral:  NA  Discharge planning Services  CM Consult  Post Acute Care Choice:  NA Choice offered to:  NA  DME Arranged:    DME Agency:     HH Arranged:    HH Agency:     Status of Service:  In process, will continue to follow  Medicare Important Message Given:    Date Medicare IM Given:    Medicare IM give by:    Date Additional Medicare IM Given:    Additional Medicare Important Message give by:     If discussed at [facility] of Stay Meetings, dates discussed:    Additional Comments:  Leeroy Cha, RN 06/19/2015, 11:18 AM

## 2015-06-19 NOTE — Progress Notes (Signed)
ANTICOAGULATION CONSULT NOTE - Brief Follow Up  Pharmacy Consult for heparin Indication: DVT  Heparin level therapeutic this evening at 0.49 units/ml with infusion at 750 units/hr.  Note patient has narrower goals of therapy = Heparin level 0.3-0.5 units/ml.  Plan: - Continue heparin infusion at 750 units/hr. - F/u HL in AM.  Hershal Coria, PharmD, BCPS Pager: [phone] 06/19/2015 7:42 PM

## 2015-06-19 NOTE — Progress Notes (Addendum)
Pharmacy Antibiotic Follow-up Note  [name] is a 82 y.o. year-old female admitted on 06/16/2015.  The patient is currently on day 4 of vancomycin/aztreonam for sepsis d/t paraspinal abscess and multiple chronic wounds.  Assessment/Plan:  After speaking with Dr. Erlinda Hong, antibiotics will be changed to Primaxin 250 mg IV q8 hr.  PCN/cephalosporin allergies noted, but has tolerated both imipenem and ertapenem previously.   Temp (24hrs), Avg:97.9 F (36.6 C), Min:97.5 F (36.4 C), Max:98.8 F (37.1 C)   Recent Labs Lab 06/16/15 0904 06/16/15 1710 06/17/15 0315 06/18/15 0430 06/19/15 0555  WBC 5.1 9.7 6.5 4.6 4.7    Recent Labs Lab 06/16/15 0904 06/16/15 1710 06/17/15 0315 06/18/15 0430 06/19/15 0555  CREATININE 2.0* 2.08* 2.00* 1.96* 1.84*   Estimated Creatinine Clearance: 23.5 mL/min (by C-G formula based on Cr of 1.84).    Allergies  Allergen Reactions  . Amlodipine Swelling  . Ciprofloxacin Hives and Other (See Comments)    REACTION: weakness  . Mirtazapine Other (See Comments)    Hallucinations and nightmares, verbally aggressive   . Statins Other (See Comments)  . Aspirin Swelling    Mouth swelling, tongue  . Cephalexin Hives  . Hydralazine Nausea And Vomiting  . Iron Nausea And Vomiting  . Lorazepam Other (See Comments)    Hallucinations, verbally aggressive  . Sulfonamide Derivatives Hives  . Penicillins Other (See Comments)    Has patient had a PCN reaction causing immediate rash, facial/tongue/throat swelling, SOB or lightheadedness with hypotension: Yes Has patient had a PCN reaction causing severe rash involving mucus membranes or skin necrosis: Yes Has patient had a PCN reaction that required hospitalization No Has patient had a PCN reaction occurring within the last 10 years: No If all of the above answers are "NO", then may proceed with Cephalosporin use.   . Shellfish Allergy Hives and Rash  . Tape Rash    Antimicrobials this admission: 1/13  >> vancomycin >> 1/13 >> aztreonam >>    1/13 >> vancomycin PO (empiric) >>  Levels/dose changes this admission: none  Microbiology results: 1/13 blood: 1/2 GNR 1/13 wound (sacral wound): few S aureus (R-tetracyc only) 1/13 wound (R foot): reincubating 1/14 MRSA screen neg 1/14 abscess cx: multiple org present, none predom 1/14 urine: IP Cdiff ordered, not collected???  Today, 06/19/2015: Temp: afebrile; previously low temps WBC: stable wnl Renal: SCr stable; CrCl 24 ml/min CG   Thank you for allowing pharmacy to be a part of this patient's care.  Reuel Boom, PharmD, BCPS Pager: [phone] 06/19/2015, 9:46 AM

## 2015-06-19 NOTE — Progress Notes (Signed)
PROGRESS NOTE  [name]:[id number] DOB: 01-11-1934 DOA: 06/16/2015 PCP: Wende Neighbors, MD  HPI/Recap of past 24 hours:  improving smiling denies sob, reported chronic back pain and leg pain, , family not in room  Assessment/Plan: Active Problems:   Pressure ulcer   Non-small cell lung cancer ([place])   Paraspinal mass   Metastatic squamous cell carcinoma to bone (HCC)   Hypotension   Severe protein-calorie malnutrition ([place])   Hypothermia   Paraspinal abscess (H[facility])   Malnutrition of moderate degree  Hypothermia/hypotension/sepsis on presentation ,  1/13 on admission: concerning of sepsis, with multiple possible source including sacral wound, right leg wound, paraspinal fluids collection, diarrhea. Sepsis order set placed, blood culture and wound culture obtained, awaiting cdiff/urine/mrsa screening sample collection, empirically start on vanc/aztreonam (h/o pcn,keflex,cipro and sulfa allergy)and oral vanc for now. ivf fluids. 1/14: bp and body temperature normalized 1/16 better, d/c iv vanc and oral vanc ( no diarrhea since admitted)  g- rod bacteremia: on azetreonam.   Paraspinal fluids collection: i have talked to IR Dr Kathlene Cote, CT guided aspiration of the paraspinal fluids with culture /gram stain/ cell count, cytology if possible  Sacral wound: chronic, per daughter, patient was recently released from the wound care center, has been getting wound care by home health RN. On visual inspection, wound appear to be deep, but with clean base, minimal clear scant drainage, no associated erythema, I have discussed Ct ab/pel obtained on 1/1with radiology Dr Kathlene Cote, he state the sacral wound seems to be superficial on CT scan, but there is a concern of the wound extend to the paraspinal fluids collection. I have discussed the case with general surgery Dr. Hassell Done on 1/15, he recommended conservative management for now, if patient become septic again, may consider formal general  surgery consult.  Wound culture MSSA, wound care consulted.  right leg wound: right leg xray with extensive soft tissue swelling, edema vs infection, marked osteropenia? Osseous metastases.  wound culture, wound care. Lower extremity venous US ordered.  Diarrhea: was empirically start on oral vanc from admission, better, no diarrhea since admitted, c diff not collected, d/c oral vanc. Addendum: patient started to have watery diarrhea again this morning  Bilateral pleural effusion:  on room air, denies sob,  s/p  ivf due to hypotension. echocardiogram lvef 50-55% with grade i diastolic dysfunction, Now off ivf. monitor volume status, trial of lasix '40mg'$  iv on 1/16, monitor urine output.  CKD iii/IV with history of right sided hydronephrosis s/p percutaneous nephrostomy tube,in 11/2014 which later removed ; at baseline, renal dosing meds, sodium bicarb added due to mild acidosis.    UTI/ urine culture pending, on abx  Bilateral pedal edema, right lower extremity edema: venous US pending ,H/o bilateral lower extremity DVT: was on lovenox , per daughter lovenox discontinued  two months ago due to " patient is not able to tolerate injection".   Acute DVT:  right common femoral vein, right femoral vein, right profunda femoral vein, right popliteal vein, and right proximal peroneal veins. No DVT LLE.  heparin drip vs ivc filter? Patient was on lovenox for previous DVt, but was taken off lovenox two months ago for unclear reason. 1/14 started heparin drip for now, monitor hgb and sign.  Primary hematology/oncologist Dr Julien Nordmann consulted, awaiting for recommendations.  Stage IV lung cancer: Dr Julien Nordmann consulted.  Anemia, likely anemia of chronic disease: close to baseline, no appatent blood loss, stool guaiac pending, urine looks clear. Monitor, transfuse prn to keep hgb>7.  CAD s/p stent in 2007, h/o chronic systolic chf: echo pending, no chest pain, no sob. Sinus rhythm  H/o HTN: has been  taken off bp meds since 03/2015 due to progressive weakness and low bp. Now blood pressure started to be elevated, started low dose lopressor on 1/15 1/16 bp remain elevated on lopressor, trial of lasix on 1/16    Moderate to Severe malnutrition:  bedbound status, very low amount of subc fat on CT scan, recent weight loss, nutrition consult.  FTT: over all poor prognosis, patient wants to be full code.    DVT prophylaxis: scd's, family states patient not able to tolerate lovenox injection? Now on heparin drip due to acute DVt, awaiting hemotology/oncology rec's for choice of anticoagulation.  Consultants:  oncology Dr Benay Spice ( over the phone on 1/13) , Dr Alen Blew on 1/14, primary oncologist Dr. Julien Nordmann notified on Monday 1/16. IR for paraspinous fluids collection drain ( talked To Dr. Kathlene Cote) Talked to general surgery Dr Hassell Done on 1/15, please see detail above.  Code Status: full , confirmed with patient and daughter  Family Communication: Patient and daughter   Disposition Plan: out of stepdown to med tele on 1/16    Procedures: 1/14 CT guided aspiration of paraspinal fluid collection (lumbar region)  Antibiotics:  vanc from admission to 1/16  azetreonam from admission -  Oral vanc from admission to 1/16   Objective: BP 150/71 mmHg  Pulse 81  Temp(Src) 97.5 F (36.4 C) (Oral)  Resp 14  Ht '5\' 8"'$  (1.727 m)  Wt 60 kg (132 lb 4.4 oz)  BMI 20.12 kg/m2  SpO2 99%  Intake/Output Summary (Last 24 hours) at 06/19/15 1031 Last data filed at 06/19/15 0700  Gross per 24 hour  Intake 1093.12 ml  Output   1026 ml  Net  67.12 ml   Filed Weights   06/16/15 1440 06/16/15 1940  Weight: 55.9 kg (123 lb 3.8 oz) 60 kg (132 lb 4.4 oz)    Exam:   General: Frail, hard of hearing, NAD  Eyes: PERRL  ENT: unremarkable  Neck: supple, no JVD  Cardiovascular: sinus tachycardia resolved, now NSR  Respiratory: diminished at basis, no rales, no wheezing, no  rhonchi,  Abdomen: soft/ND/ND, positive bowel sounds  Skin: chronic sacral wound with scant clear drainage, unstageable, no apparent erythema, right lower leg pitting edema but no erythema, chronic right ankle/heal wound, scattered eccymosis  Musculoskeletal:right lower leg edema,  Bilateral pitting pedal edema  Psychiatric: calm/cooperative  Neurologic: overall frail, decreased muscle tone, but no apparent focal deficit   Data Reviewed: Basic Metabolic Panel:  Recent Labs Lab 06/16/15 0904 06/16/15 1710 06/17/15 0315 06/18/15 0430 06/19/15 0555  NA 138 132* 132* 136 139  K 3.7 3.8 3.7 3.9 3.7  CL  --  104 105 107 113*  CO2 20* 18* 18* 19* 20*  GLUCOSE 83 173* 98 74 82  BUN 32.3* 32* 32* 31* 35*  CREATININE 2.0* 2.08* 2.00* 1.96* 1.84*  CALCIUM 7.2* 6.9* 6.8* 6.8* 7.0*   Liver Function Tests:  Recent Labs Lab 06/16/15 0904 06/16/15 1710 06/17/15 0315  AST '15 20 16  '$ ALT <9 11* 10*  ALKPHOS 164* 152* 134*  BILITOT 0.38 0.7 0.6  PROT 4.5* 4.3* 3.8*  ALBUMIN 1.5* 1.5* 1.3*   No results for input(s): LIPASE, AMYLASE in the last 168 hours. No results for input(s): AMMONIA in the last 168 hours. CBC:  Recent Labs Lab 06/16/15 0904 06/16/15 1710 06/17/15 0315 06/18/15 0430 06/19/15 0555  WBC  5.1 9.7 6.5 4.6 4.7  NEUTROABS 3.5 9.1*  --   --   --   HGB 9.1* 8.7* 7.4* 7.5* 7.2*  HCT 28.6* 27.8* 23.2* 23.7* 22.7*  MCV 85.0 86.9 86.2 86.2 86.3  PLT 215 173 128* 148* 157   Cardiac Enzymes:   No results for input(s): CKTOTAL, CKMB, CKMBINDEX, TROPONINI in the last 168 hours. BNP (last 3 results) No results for input(s): BNP in the last 8760 hours.  ProBNP (last 3 results) No results for input(s): PROBNP in the last 8760 hours.  CBG: No results for input(s): GLUCAP in the last 168 hours.  Recent Results (from the past 240 hour(s))  Culture, blood (x 2)     Status: None   Collection Time: 06/16/15  4:25 PM  Result Value Ref Range Status   Specimen  Description BLOOD RIGHT ARM  Final   Special Requests BOTTLES DRAWN AEROBIC AND ANAEROBIC  5CC, 4CC  Final   Culture  Setup Time   Final    GRAM NEGATIVE RODS IN BOTH AEROBIC AND ANAEROBIC BOTTLES CRITICAL RESULT CALLED TO, READ BACK BY AND VERIFIED WITH: Windell Norfolk AT [id number] ON [id number] BY Rhea Bleacher    Culture   Final    KLEBSIELLA OXYTOCA ENTEROBACTER CLOACAE Performed at [facility]    Report Status 06/19/2015 FINAL  Final   Organism ID, Bacteria KLEBSIELLA OXYTOCA  Final   Organism ID, Bacteria ENTEROBACTER CLOACAE  Final      Susceptibility   Enterobacter cloacae - MIC*    CEFAZOLIN >=64 RESISTANT Resistant     CEFEPIME <=1 SENSITIVE Sensitive     CEFTAZIDIME <=1 SENSITIVE Sensitive     CEFTRIAXONE <=1 SENSITIVE Sensitive     CIPROFLOXACIN <=0.25 SENSITIVE Sensitive     GENTAMICIN <=1 SENSITIVE Sensitive     IMIPENEM 0.5 SENSITIVE Sensitive     TRIMETH/SULFA >=320 RESISTANT Resistant     PIP/TAZO <=4 SENSITIVE Sensitive     * ENTEROBACTER CLOACAE   Klebsiella oxytoca - MIC*    AMPICILLIN >=32 RESISTANT Resistant     CEFAZOLIN >=64 RESISTANT Resistant     CEFEPIME <=1 SENSITIVE Sensitive     CEFTAZIDIME <=1 SENSITIVE Sensitive     CEFTRIAXONE <=1 SENSITIVE Sensitive     CIPROFLOXACIN <=0.25 SENSITIVE Sensitive     GENTAMICIN <=1 SENSITIVE Sensitive     IMIPENEM <=0.25 SENSITIVE Sensitive     TRIMETH/SULFA <=20 SENSITIVE Sensitive     AMPICILLIN/SULBACTAM 4 SENSITIVE Sensitive     PIP/TAZO <=4 SENSITIVE Sensitive     * KLEBSIELLA OXYTOCA  Culture, blood (x 2)     Status: None (Preliminary result)   Collection Time: 06/16/15  4:40 PM  Result Value Ref Range Status   Specimen Description BLOOD LEFT ARM  Final   Special Requests IN PEDIATRIC BOTTLE 0 .5CC  Final   Culture   Final    NO GROWTH 2 DAYS Performed at [facility]    Report Status PENDING  Incomplete  Wound culture     Status: None   Collection Time: 06/16/15  5:40 PM  Result Value Ref  Range Status   Specimen Description SACRAL  Final   Special Requests NONE  Final   Gram Stain   Final    NO WBC SEEN NO SQUAMOUS EPITHELIAL CELLS SEEN RARE GRAM POSITIVE COCCI IN PAIRS IN CLUSTERS Performed at [facility]    Culture   Final    FEW STAPHYLOCOCCUS AUREUS Note: RIFAMPIN AND GENTAMICIN SHOULD NOT  BE USED AS SINGLE DRUGS FOR TREATMENT OF STAPH INFECTIONS. Performed at [facility]    Report Status 06/19/2015 FINAL  Final   Organism ID, Bacteria STAPHYLOCOCCUS AUREUS  Final      Susceptibility   Staphylococcus aureus - MIC*    CLINDAMYCIN <=0.25 SENSITIVE Sensitive     ERYTHROMYCIN <=0.25 SENSITIVE Sensitive     GENTAMICIN <=0.5 SENSITIVE Sensitive     LEVOFLOXACIN <=0.12 SENSITIVE Sensitive     OXACILLIN 0.5 SENSITIVE Sensitive     RIFAMPIN <=0.5 SENSITIVE Sensitive     TRIMETH/SULFA <=10 SENSITIVE Sensitive     VANCOMYCIN <=0.5 SENSITIVE Sensitive     TETRACYCLINE >=16 RESISTANT Resistant     MOXIFLOXACIN <=0.25 SENSITIVE Sensitive     * FEW STAPHYLOCOCCUS AUREUS  Wound culture     Status: None (Preliminary result)   Collection Time: 06/16/15  5:42 PM  Result Value Ref Range Status   Specimen Description FOOT RIGHT  Final   Special Requests NONE  Final   Gram Stain PENDING  Incomplete   Culture   Final    Culture reincubated for better growth Performed at [facility]    Report Status PENDING  Incomplete  MRSA PCR Screening     Status: None   Collection Time: 06/17/15 10:10 AM  Result Value Ref Range Status   MRSA by PCR NEGATIVE NEGATIVE Final    Comment:        The GeneXpert MRSA Assay (FDA approved for NASAL specimens only), is one component of a comprehensive MRSA colonization surveillance program. It is not intended to diagnose MRSA infection nor to guide or monitor treatment for MRSA infections.   Culture, Urine     Status: None (Preliminary result)   Collection Time: 06/17/15 11:53 AM  Result Value Ref Range Status    Specimen Description URINE, RANDOM  Final   Special Requests NONE  Final   Culture   Final    TOO YOUNG TO READ Performed at [facility]    Report Status PENDING  Incomplete  Culture, routine-abscess     Status: None (Preliminary result)   Collection Time: 06/17/15  1:16 PM  Result Value Ref Range Status   Specimen Description ABSCESS RIGHT PARASPINAL LUMBAR UNDER CT  Final   Special Requests Normal  Final   Gram Stain   Final    ABUNDANT WBC PRESENT, PREDOMINANTLY PMN NO SQUAMOUS EPITHELIAL CELLS SEEN NO ORGANISMS SEEN Performed at [facility]    Culture   Final    MULTIPLE ORGANISMS PRESENT, NONE PREDOMINANT Performed at [facility]    Report Status PENDING  Incomplete     Studies: No results found.  Scheduled Meds: . aztreonam  1 g Intravenous Q8H  . collagenase  1 application Topical Daily  . feeding supplement (PRO-STAT SUGAR FREE 64)  30 mL Oral BID  . gabapentin  100 mg Oral QHS  . metoprolol tartrate  12.5 mg Oral BID  . pantoprazole  40 mg Oral Daily  . sodium bicarbonate  650 mg Oral BID  . sodium chloride  3 mL Intravenous Q12H    Continuous Infusions: . sodium chloride 10 mL/hr (06/19/15 0541)  . heparin 750 Units/hr (06/19/15 0900)     Time spent: 37mns  Kayal Mula MD, PhD  [facility] Pager [phone] If 7PM-7AM, please contact night-coverage at [email or web address], password [facility]/16/2017, 10:31 AM  LOS: 3 days

## 2015-06-19 NOTE — Progress Notes (Signed)
PRINCIPAL DIAGNOSIS: Stage IV non-small cell lung cancer diagnosed in March 2007, with disease recurrence in June 2016 with positive PDL 1 expression (50%).   PRIOR THERAPY:  1) Status post 6 cycles of systemic chemotherapy with carboplatin and docetaxel. Last dose was given January 16, 2006.  2) Palliative radiotherapy to the large right paraspinous mass with osseous invasion centered at L5 under the care of Dr. Valere Dross. 3) Systemic chemotherapy with carboplatin for AUC of 5 and paclitaxel 175 MG/M2 every 3 weeks with Neulasta support. First dose expected on 02/02/2015. Status post 2 cycles, last dose was given 02/23/2015 discontinued secondary to intolerance and mild disease progression.  CURRENT THERAPY: Keytruda 200 mg IV every 3 weeks. First dose 04/14/2015.  DISEASE STAGE: Stage IV non-small cell lung cancer diagnosed in March of 2007.  CHEMOTHERAPY INTENT: Palliative  CURRENT # OF CHEMOTHERAPY CYCLES: 3  CURRENT ANTIEMETICS: None  CURRENT SMOKING STATUS: Nonsmoker  ORAL CHEMOTHERAPY AND CONSENT: None  CURRENT BISPHOSPHONATES USE: None  LIVING WILL AND CODE STATUS: Full code  Subjective: The patient is seen and examined today. She is feeling a little bit better after admission to the hospital. She was recently seen in the clinic on 06/16/2015 and was complaining of diarrhea for the previous 2 weeks. She also had significant fatigue and weakness and increased back pain. She was found also to be hypotensive with blood pressure 83/66. She was admitted to [facility] stepdown unit and started on aggressive IV hydration and antibiotic treatment for questionable back abscess. She underwent CT-guided aspiration of the right paraspinal collection by interventional radiology. The wound culture showed few staph aureus. Urine culture performed on 06/17/2015 showed >=100,000 COLONIES/mL GROUP B STREP(S.AGALACTIAE). The patient is currently on Primaxin and feeling much better. She  still have malnutrition and lack of appetite.  Objective: Vital signs in last 24 hours: Temp:  [97.5 F (36.4 C)-98.8 F (37.1 C)] 97.5 F (36.4 C) (01/16 0800) Pulse Rate:  [61-81] 61 (01/16 0400) Resp:  [10-16] 11 (01/16 0400) BP: (107-152)/(48-74) 141/58 mmHg (01/16 0544) SpO2:  [97 %-100 %] 98 % (01/16 0400)  Intake/Output from previous day: 01/15 0701 - 01/16 0700 In: 1243.1 [P.O.:180; I.V.:913.1; IV Piggyback:150] Out: 1026 [Urine:1025; Stool:1] Intake/Output this shift:    General appearance: alert, cooperative, fatigued and no distress Resp: clear to auscultation bilaterally Cardio: regular rate and rhythm, S1, S2 normal, no murmur, click, rub or gallop GI: soft, non-tender; bowel sounds normal; no masses,  no organomegaly Extremities: extremities normal, atraumatic, no cyanosis or edema  Lab Results:   Recent Labs  06/18/15 0430 06/19/15 0555  WBC 4.6 4.7  HGB 7.5* 7.2*  HCT 23.7* 22.7*  PLT 148* 157   BMET  Recent Labs  06/18/15 0430 06/19/15 0555  NA 136 139  K 3.9 3.7  CL 107 113*  CO2 19* 20*  GLUCOSE 74 82  BUN 31* 35*  CREATININE 1.96* 1.84*  CALCIUM 6.8* 7.0*    Studies/Results: Ct Aspiration  06/17/2015  CLINICAL DATA:  Metastatic lung carcinoma post chemotherapy and radiation therapy. Recent CT demonstrated new small right paraspinal collection containing gas and fluid. EXAM: CT GUIDED ASPIRATION BIOPSY OF RIGHT PARASPINAL COLLECTION ANESTHESIA/SEDATION: Intravenous Fentanyl and Versed were administered as conscious sedation during continuous cardiorespiratory monitoring by the radiology RN, with a total moderate sedation time of 6 minutes. PROCEDURE: The procedure risks, benefits, and alternatives were explained to the patient. Questions regarding the procedure were encouraged and answered. The patient understands and consents to the procedure.  Patient placed in left lateral decubitus positioning. Select axial scans through the lower abdomen  performed. The collection was localized and an appropriate skin entry site was determined and marked. The operative field was prepped with Betadinein a sterile fashion, and a sterile drape was applied covering the operative field. A sterile gown and sterile gloves were used for the procedure. Local anesthesia was provided with 1% Lidocaine. Under CT fluoroscopic guidance, 18 gauge percutaneous entry needle was advanced to the collection. Once needle tip position was confirmed, aspiration returned to mild purulent material. This was sent for routine Gram stain, culture and sensitivity. Postprocedure scan shows no hemorrhage or other apparent complication. The patient tolerated the procedure well. COMPLICATIONS: None immediate FINDINGS: Limited CT again demonstrates right paraspinal the collection containing gas and fluid. CT-guided aspiration was performed, the aspirate sent for Gram stain and culture. IMPRESSION: 1. Technically successful CT-guided aspiration of right paraspinal collection. Electronically Signed   By: Lucrezia Europe M.D.   On: 06/17/2015 13:55    Medications: I have reviewed the patient's current medications.   Assessment/Plan: 1) metastatic non-small cell lung cancer currently undergoing treatment with immunotherapy with Ketruda 200 mg IV every 3 weeks status post 3 cycles. The recent CT scan of the chest showed no clear evidence for disease progression. The patient is expected to resume her treatment an outpatient after improvement of her condition. 2) back Abscess at the site of the previous paraspinal mass status post radiotherapy: Continue current antibiotics with Primaxin. 3) history of right hydronephrosis secondary to encasement of the ureter with retroperitoneal soft tissue mass and urinary tract infection status post percutaneous nephrostomy tube. Resolved. 4) chronic renal insufficiency: Stable. 5) history of anticoagulation: The patient has no clear evidence for DVT thrombosis or  pulmonary embolism. I would continue her on prophylactic anticoagulation for now. No need for long-term anticoagulation in the absence of any clear evidence of deep venous thrombosis or pulmonary. Embolism. 6) malnutrition: Nutritional consult. Thank you for taking good care of Ms. Sabra Heck, I will continue to follow up the patient with you and assist in her management an as-needed basis.     LOS: 3 days    Seeley Southgate K. 06/19/2015

## 2015-06-19 NOTE — Progress Notes (Signed)
ANTICOAGULATION CONSULT NOTE - F/u Consult  Pharmacy Consult for heparin Indication: DVT  Allergies  Allergen Reactions  . Amlodipine Swelling  . Ciprofloxacin Hives and Other (See Comments)    REACTION: weakness  . Mirtazapine Other (See Comments)    Hallucinations and nightmares, verbally aggressive   . Statins Other (See Comments)  . Aspirin Swelling    Mouth swelling, tongue  . Cephalexin Hives  . Hydralazine Nausea And Vomiting  . Iron Nausea And Vomiting  . Lorazepam Other (See Comments)    Hallucinations, verbally aggressive  . Sulfonamide Derivatives Hives  . Penicillins Other (See Comments)    Has patient had a PCN reaction causing immediate rash, facial/tongue/throat swelling, SOB or lightheadedness with hypotension: Yes Has patient had a PCN reaction causing severe rash involving mucus membranes or skin necrosis: Yes Has patient had a PCN reaction that required hospitalization No Has patient had a PCN reaction occurring within the last 10 years: No If all of the above answers are "NO", then may proceed with Cephalosporin use.   . Shellfish Allergy Hives and Rash  . Tape Rash    Patient Measurements: Height: '5\' 8"'$  (172.7 cm) Weight: 132 lb 4.4 oz (60 kg) IBW/kg (Calculated) : 63.9 Heparin Dosing Weight: using actual body weight of 60 kg  Vital Signs: Temp: 97.5 F (36.4 C) (01/16 0800) Temp Source: Oral (01/16 0800) BP: 141/58 mmHg (01/16 0544) Pulse Rate: 61 (01/16 0400)  Labs:  Recent Labs  06/16/15 1710 06/17/15 0315 06/18/15 0430  06/18/15 1255 06/18/15 2230 06/19/15 0555  HGB 8.7* 7.4* 7.5*  --   --   --  7.2*  HCT 27.8* 23.2* 23.7*  --   --   --  22.7*  PLT 173 128* 148*  --   --   --  157  APTT 42*  --   --   --   --   --   --   LABPROT 15.3*  --   --   --   --   --   --   INR 1.19  --   --   --   --   --   --   HEPARINUNFRC  --   --   --   < > 0.59 0.59 0.52  CREATININE 2.08* 2.00* 1.96*  --   --   --  1.84*  < > = values in this  interval not displayed.  Estimated Creatinine Clearance: 23.5 mL/min (by C-G formula based on Cr of 1.84).   Medical History: Past Medical History  Diagnosis Date  . Hypertension   . Hypercholesterolemia   . CKD (chronic kidney disease)   . Coronary artery disease     a.  LHC (06/04/05): LHC done in [place] with high grade RCA => s/p BMS to RCA;  b.  Nuclear (09/14/09): Lexiscan; Inf infarct with mild peri-infarct ishemia, EF 52%; Low Risk.  Marland Kitchen GERD (gastroesophageal reflux disease)   . Chronic anemia   . Ischemic cardiomyopathy     a. Echo (07/26/13): Mild LVH, EF 35-40%, diff HK, inf AK, Gr 2 DD, Tr AI, mildly dilated Ao root, MAC, mild MR, mild LAE, mod reduced RVSF.  . Non-small cell carcinoma of lung ([place])     Stage IV  . Chronic fatigue 04/04/2015  . Chronic fatigue 04/04/2015    Medications:  Scheduled:  . aztreonam  1 g Intravenous Q8H  . collagenase  1 application Topical Daily  . feeding supplement (PRO-STAT SUGAR FREE 64)  30 mL Oral BID  . gabapentin  100 mg Oral QHS  . metoprolol tartrate  12.5 mg Oral BID  . pantoprazole  40 mg Oral Daily  . sodium bicarbonate  650 mg Oral BID  . sodium chloride  3 mL Intravenous Q12H  . vancomycin  250 mg Oral 4 times per day  . vancomycin  500 mg Intravenous Q24H    Assessment: Pt is a 82 yo female diagnosed with DVT noted in the right common femoral vein, right femoral vein, right profunda femoral vein, right popliteal vein, and right proximal peroneal veins. Pt was previously on enoxaparin PTA but has not been on medication for greater than 30 days. Pharmacy consulted to being heparin infusion 1/14 PM.  D/t concern of dropping Hgb levels, omitted heparin bolus & use lower threshold for anticoagulation level (anti-Xa 0.3 to 0.5).   Significant Events: 1/14 am paraspinal fluid aspiration by IR 1/14 pm heparin infusion initiated   Today, 06/19/2015:  CBC: Hgb still dropping slowly, but Plt improved  Most recent  heparin level sl supratherapeutic on 800 units/hr  No bleeding or infusion issues per nursing  CrCl: 24 ml/min  Goal of Therapy: Heparin level 0.3-0.5 units/ml Monitor platelets by anticoagulation protocol: Yes  Plan:  Decrease heparin IV infusion to 750 units/hr  Check heparin level 8 hrs after rate change  Daily CBC, daily heparin level once stable  Monitor for signs of bleeding or thrombosis   Reuel Boom, PharmD, BCPS Pager: [phone] 06/19/2015, 8:50 AM

## 2015-06-20 DIAGNOSIS — G061 Intraspinal abscess and granuloma: Secondary | ICD-10-CM

## 2015-06-20 DIAGNOSIS — L89513 Pressure ulcer of right ankle, stage 3: Secondary | ICD-10-CM

## 2015-06-20 DIAGNOSIS — L89213 Pressure ulcer of right hip, stage 3: Secondary | ICD-10-CM

## 2015-06-20 DIAGNOSIS — A419 Sepsis, unspecified organism: Principal | ICD-10-CM

## 2015-06-20 DIAGNOSIS — L89154 Pressure ulcer of sacral region, stage 4: Secondary | ICD-10-CM

## 2015-06-20 DIAGNOSIS — B9689 Other specified bacterial agents as the cause of diseases classified elsewhere: Secondary | ICD-10-CM

## 2015-06-20 DIAGNOSIS — R197 Diarrhea, unspecified: Secondary | ICD-10-CM

## 2015-06-20 DIAGNOSIS — B9561 Methicillin susceptible Staphylococcus aureus infection as the cause of diseases classified elsewhere: Secondary | ICD-10-CM

## 2015-06-20 DIAGNOSIS — Z9889 Other specified postprocedural states: Secondary | ICD-10-CM

## 2015-06-20 LAB — BASIC METABOLIC PANEL
ANION GAP: 8 (ref 5–15)
BUN: 38 mg/dL — ABNORMAL HIGH (ref 6–20)
CHLORIDE: 111 mmol/L (ref 101–111)
CO2: 19 mmol/L — AB (ref 22–32)
Calcium: 6.9 mg/dL — ABNORMAL LOW (ref 8.9–10.3)
Creatinine, Ser: 1.79 mg/dL — ABNORMAL HIGH (ref 0.44–1.00)
GFR calc non Af Amer: 26 mL/min — ABNORMAL LOW (ref >60)
GFR, EST AFRICAN AMERICAN: 30 mL/min — AB (ref >60)
Glucose, Bld: 68 mg/dL (ref 65–99)
Potassium: 3.2 mmol/L — ABNORMAL LOW (ref 3.5–5.1)
Sodium: 138 mmol/L (ref 135–145)

## 2015-06-20 LAB — CBC
HCT: 28.9 % — ABNORMAL LOW (ref 36.0–46.0)
HCT: 26.8 % — ABNORMAL LOW (ref 36.0–46.0)
HEMATOCRIT: 21.9 % — AB (ref 36.0–46.0)
HEMOGLOBIN: 6.9 g/dL — AB (ref 12.0–15.0)
HEMOGLOBIN: 8.6 g/dL — AB (ref 12.0–15.0)
Hemoglobin: 9.3 g/dL — ABNORMAL LOW (ref 12.0–15.0)
MCH: 27.4 pg (ref 26.0–34.0)
MCH: 28.2 pg (ref 26.0–34.0)
MCH: 28.2 pg (ref 26.0–34.0)
MCHC: 31.5 g/dL (ref 30.0–36.0)
MCHC: 32.2 g/dL (ref 30.0–36.0)
MCHC: 32.1 g/dL (ref 30.0–36.0)
MCV: 86.9 fL (ref 78.0–100.0)
MCV: 87.6 fL (ref 78.0–100.0)
MCV: 87.9 fL (ref 78.0–100.0)
PLATELETS: 163 10*3/uL (ref 150–400)
PLATELETS: 145 10*3/uL — AB (ref 150–400)
Platelets: 146 10*3/uL — ABNORMAL LOW (ref 150–400)
RBC: 2.52 MIL/uL — ABNORMAL LOW (ref 3.87–5.11)
RBC: 3.3 MIL/uL — ABNORMAL LOW (ref 3.87–5.11)
RBC: 3.05 MIL/uL — AB (ref 3.87–5.11)
RDW: 15.2 % (ref 11.5–15.5)
RDW: 14.6 % (ref 11.5–15.5)
RDW: 14.8 % (ref 11.5–15.5)
WBC: 4.1 10*3/uL (ref 4.0–10.5)
WBC: 6.6 10*3/uL (ref 4.0–10.5)
WBC: 4.2 10*3/uL (ref 4.0–10.5)

## 2015-06-20 LAB — CULTURE, ROUTINE-ABSCESS: SPECIAL REQUESTS: NORMAL

## 2015-06-20 LAB — PREPARE RBC (CROSSMATCH)

## 2015-06-20 LAB — HEPARIN LEVEL (UNFRACTIONATED): Heparin Unfractionated: 0.42 IU/mL (ref 0.30–0.70)

## 2015-06-20 LAB — TROPONIN I: Troponin I: <0.03 ng/mL (ref <0.031)

## 2015-06-20 MED ORDER — SODIUM CHLORIDE 0.9 % IV SOLN
Freq: Once | INTRAVENOUS | Status: AC
  Administered 2015-06-20: 10:00:00 via INTRAVENOUS

## 2015-06-20 MED ORDER — LOPERAMIDE HCL 2 MG PO CAPS
2.0000 mg | ORAL_CAPSULE | Freq: Four times a day (QID) | ORAL | Status: DC | PRN
  Administered 2015-06-23: 2 mg via ORAL
  Filled 2015-06-20 (×2): qty 1

## 2015-06-20 MED ORDER — POTASSIUM CHLORIDE CRYS ER 20 MEQ PO TBCR
40.0000 meq | EXTENDED_RELEASE_TABLET | Freq: Once | ORAL | Status: AC
  Administered 2015-06-20: 40 meq via ORAL
  Filled 2015-06-20 (×2): qty 2

## 2015-06-20 NOTE — Progress Notes (Signed)
OT Cancellation Note  Patient Details Name: [name] MRN: [id number] DOB: 06-26-1933   Cancelled Treatment:    Reason Eval/Treat Not Completed: Medical issues which prohibited therapy (Patient with Hemoglobin 6.9. OT will follow up for evaluation as indicated.)  Ozzie Remmers A 06/20/2015, 8:25 AM

## 2015-06-20 NOTE — Care Management Important Message (Signed)
Important Message  Patient Details IM Letter given to Kathy/Case Manager to present to Patient Name: [name] MRN: [id number] Date of Birth: 1933-11-07   Medicare Important Message Given:  Yes    Camillo Flaming 06/20/2015, 10:14 AMImportant Message  Patient Details  Name: [name] MRN: [id number] Date of Birth: 06-07-1933   Medicare Important Message Given:  Yes    Camillo Flaming 06/20/2015, 10:14 AM

## 2015-06-20 NOTE — Progress Notes (Addendum)
PROGRESS NOTE  [name] PNT:[id number] DOB: 10-31-1933 DOA: 06/16/2015 PCP: Wende Neighbors, MD  HPI/Recap of past 24 hours:   smiling denies sob, reported chronic back pain and leg pain, , family not in room  Assessment/Plan: Active Problems:   Pressure ulcer   Non-small cell lung cancer ([place])   Paraspinal mass   Metastatic squamous cell carcinoma to bone ([place])   Hypotension   Severe protein-calorie malnutrition ([place])   Hypothermia   Paraspinal abscess ([place])   Malnutrition of moderate degree   Abscess   Edema   Sepsis ([place])    Hypothermia/hypotension/sepsis on presentation ,  1/13 on admission: concerning of sepsis, with multiple possible source including sacral wound, right leg wound, paraspinal fluids collection, uti. Sepsis order set placed, culture obtained, empirically start on vanc/aztreonam (h/o pcn,keflex,cipro and sulfa allergy)and oral vanc for now. ivf fluids. 1/14: bp and body temperature normalized 1/16 better, d/c iv vanc (mrsa screen negative), d/c oral vanc ( c diff negative), d/c aztreonam, abx changed to imipenem on 1/16 to cover mssa in the wound, g- in blood and strep b in urine, 1/17 infectious disease consulted.  g- rod bacteremia: on azetreonam from admission to 1/16, changed to imipenem.   Paraspinal fluids collection/abscess: CT guided aspiration of the paraspinal fluids on 1/14, f/u on culture result.  Sacral wound: chronic,  per daughter, patient was recently released from the wound care center, has been getting wound care by home health RN. On visual inspection, wound appear to be deep, but with clean base, minimal clear scant drainage, no associated erythema, I have discussed Ct ab/pel obtained on 1/1with radiology Dr Kathlene Cote on 1/14, he state the sacral wound seems to be superficial on CT scan, but there is a concern of the wound extend to the paraspinal fluids collection. I have discussed the case with general surgery Dr. Hassell Done on 1/15, he  recommended conservative management for now, if patient become septic again, may consider formal general surgery consult.  Wound culture MSSA, wound care consulted.  right leg wound: right leg xray with extensive soft tissue swelling, edema vs infection, marked osteropenia? Osseous metastases.  wound culture, wound care. Lower extremity venous US + acute DVT.  Diarrhea:  was empirically start on oral vanc from admission,  c diff negative, d/c oral vanc. Prn immodium  Bilateral pleural effusion:   on room air, denies sob,  s/p  ivf due to hypotension. echocardiogram lvef 50-55% with grade i diastolic dysfunction, Now off ivf. monitor volume status, trial of lasix '40mg'$  iv on 1/16, with good urine output. Lasix held on 1/17 due to borderline bp.  CKD iii/IV with history of right sided hydronephrosis s/p percutaneous nephrostomy tube,in 11/2014 which later removed ; at baseline, renal dosing meds, sodium bicarb added due to mild acidosis.    UTI/ urine culture group b strep, on abx  Bilateral pedal edema, right lower extremity edema: venous US pending ,H/o bilateral lower extremity DVT: was on lovenox , per daughter lovenox discontinued  two months ago due to " patient is not able to tolerate injection, due to easy bruising, denies nose bleed, hematemesis, blood in the urine, denies blood in stool in the past"  Acute DVT:  right common femoral vein, right femoral vein, right profunda femoral vein, right popliteal vein, and right proximal peroneal veins. No DVT LLE.  heparin drip vs ivc filter? Patient was on lovenox for previous DVt, but was taken off lovenox two months ago for unclear reason. 1/14 started heparin  drip for now, monitor hgb and sign.  Primary hematology/oncologist Dr Julien Nordmann consulted, he recommended lovenox, will switch to lovenox, need to monitor sign of bleed, may need ivc filter, but currently are being treated for bacteremia, may consider palliative consult.  Stage IV  lung cancer: Dr Julien Nordmann consulted.  Anemia, likely anemia of chronic disease: close to baseline, no appatent blood loss, stool guaiac + on 1/16, urine looks clear. Monitor, transfuse prn to keep hgb>7.  For now continue anticoagulation, no ivf filter in the setting of bacteremia, if hgb drop significant from baseline, may need palliative care consult  CAD s/p stent in 2007, h/o chronic systolic chf: echo pending, no chest pain, no sob. Sinus rhythm  H/o HTN: has been taken off bp meds since 03/2015 due to progressive weakness and low bp. Now blood pressure started to be elevated, started low dose lopressor on 1/15 1/16 bp remain elevated on lopressor, trial of lasix on 1/16  Report new onset of afib on night of 1/16 to 1/17: i have reviewed the EKG, it seems more like sinus rhythm with frequent pac's, first degree av block, patient denies chest pain, no sob, no dizzy, bp stable, already on heparin drip, continue betablocker, keep k>4, mag >2. Echo result as mentioned above.  Moderate to Severe malnutrition:  bedbound status, very low amount of subc fat on CT scan, recent weight loss, nutrition consult.  FTT: over all poor prognosis, patient wants to be full code.  Overall poor prognosis, may need palliative care consult.  DVT prophylaxis: scd's, family states patient not able to tolerate lovenox injection? Now on heparin drip due to acute DVT,   Consultants:  oncology Dr Benay Spice ( over the phone on 1/13) , Dr Alen Blew on 1/14, primary oncologist Dr. Julien Nordmann notified on Monday 1/16. IR for paraspinous fluids collection drain ( talked To Dr. Kathlene Cote) Talked to general surgery Dr Hassell Done on 1/15, please see detail above. Infectious disease   Code Status: full , confirmed with patient and daughter  Family Communication: Patient and daughter   Disposition Plan: out of stepdown to med tele on 1/16    Procedures: 1/14 CT guided aspiration of paraspinal fluid collection (lumbar  region)  Antibiotics:  vanc from admission to 1/16  azetreonam from admission -1/16  Oral vanc from admission to 1/16  Imipenem from 1/16   Objective: BP 98/44 mmHg  Pulse 71  Temp(Src) 97.5 F (36.4 C) (Oral)  Resp 14  Ht '5\' 8"'$  (1.727 m)  Wt 60 kg (132 lb 4.4 oz)  BMI 20.12 kg/m2  SpO2 98%  Intake/Output Summary (Last 24 hours) at 06/20/15 1118 Last data filed at 06/20/15 0959  Gross per 24 hour  Intake 950.25 ml  Output   2575 ml  Net -1624.75 ml   Filed Weights   06/16/15 1440 06/16/15 1940  Weight: 55.9 kg (123 lb 3.8 oz) 60 kg (132 lb 4.4 oz)    Exam:   General: Frail, hard of hearing, NAD  Eyes: PERRL  ENT: unremarkable  Neck: supple, no JVD  Cardiovascular: sinus tachycardia resolved, now NSR  Respiratory: diminished at basis, no rales, no wheezing, no rhonchi,  Abdomen: soft/ND/ND, positive bowel sounds  Skin: chronic sacral wound with scant clear drainage, unstageable, no apparent erythema, right lower leg pitting edema but no erythema, chronic right ankle/heal wound, scattered ecchymosis  Musculoskeletal:right lower leg edema,  Bilateral pitting pedal edema  Psychiatric: calm/cooperative  Neurologic: overall frail, decreased muscle tone, but no apparent focal deficit  Data Reviewed: Basic Metabolic Panel:  Recent Labs Lab 06/16/15 1710 06/17/15 0315 06/18/15 0430 06/19/15 0555 06/20/15 0515  NA 132* 132* 136 139 138  K 3.8 3.7 3.9 3.7 3.2*  CL 104 105 107 113* 111  CO2 18* 18* 19* 20* 19*  GLUCOSE 173* 98 74 82 68  BUN 32* 32* 31* 35* 38*  CREATININE 2.08* 2.00* 1.96* 1.84* 1.79*  CALCIUM 6.9* 6.8* 6.8* 7.0* 6.9*   Liver Function Tests:  Recent Labs Lab 06/16/15 0904 06/16/15 1710 06/17/15 0315  AST '15 20 16  '$ ALT <9 11* 10*  ALKPHOS 164* 152* 134*  BILITOT 0.38 0.7 0.6  PROT 4.5* 4.3* 3.8*  ALBUMIN 1.5* 1.5* 1.3*   No results for input(s): LIPASE, AMYLASE in the last 168 hours. No results for input(s):  AMMONIA in the last 168 hours. CBC:  Recent Labs Lab 06/16/15 0904 06/16/15 1710 06/17/15 0315 06/18/15 0430 06/19/15 0555 06/20/15 0515  WBC 5.1 9.7 6.5 4.6 4.7 4.1  NEUTROABS 3.5 9.1*  --   --   --   --   HGB 9.1* 8.7* 7.4* 7.5* 7.2* 6.9*  HCT 28.6* 27.8* 23.2* 23.7* 22.7* 21.9*  MCV 85.0 86.9 86.2 86.2 86.3 86.9  PLT 215 173 128* 148* 157 146*   Cardiac Enzymes:    Recent Labs Lab 06/20/15 0515  TROPONINI <0.03   BNP (last 3 results) No results for input(s): BNP in the last 8760 hours.  ProBNP (last 3 results) No results for input(s): PROBNP in the last 8760 hours.  CBG: No results for input(s): GLUCAP in the last 168 hours.  Recent Results (from the past 240 hour(s))  Culture, blood (x 2)     Status: None   Collection Time: 06/16/15  4:25 PM  Result Value Ref Range Status   Specimen Description BLOOD RIGHT ARM  Final   Special Requests BOTTLES DRAWN AEROBIC AND ANAEROBIC  5CC, 4CC  Final   Culture  Setup Time   Final    GRAM NEGATIVE RODS IN BOTH AEROBIC AND ANAEROBIC BOTTLES CRITICAL RESULT CALLED TO, READ BACK BY AND VERIFIED WITH: Windell Norfolk AT [id number] ON [id number] BY Rhea Bleacher    Culture   Final    KLEBSIELLA OXYTOCA ENTEROBACTER CLOACAE Performed at [facility]    Report Status 06/19/2015 FINAL  Final   Organism ID, Bacteria KLEBSIELLA OXYTOCA  Final   Organism ID, Bacteria ENTEROBACTER CLOACAE  Final      Susceptibility   Enterobacter cloacae - MIC*    CEFAZOLIN >=64 RESISTANT Resistant     CEFEPIME <=1 SENSITIVE Sensitive     CEFTAZIDIME <=1 SENSITIVE Sensitive     CEFTRIAXONE <=1 SENSITIVE Sensitive     CIPROFLOXACIN <=0.25 SENSITIVE Sensitive     GENTAMICIN <=1 SENSITIVE Sensitive     IMIPENEM 0.5 SENSITIVE Sensitive     TRIMETH/SULFA >=320 RESISTANT Resistant     PIP/TAZO <=4 SENSITIVE Sensitive     * ENTEROBACTER CLOACAE   Klebsiella oxytoca - MIC*    AMPICILLIN >=32 RESISTANT Resistant     CEFAZOLIN >=64 RESISTANT Resistant      CEFEPIME <=1 SENSITIVE Sensitive     CEFTAZIDIME <=1 SENSITIVE Sensitive     CEFTRIAXONE <=1 SENSITIVE Sensitive     CIPROFLOXACIN <=0.25 SENSITIVE Sensitive     GENTAMICIN <=1 SENSITIVE Sensitive     IMIPENEM <=0.25 SENSITIVE Sensitive     TRIMETH/SULFA <=20 SENSITIVE Sensitive     AMPICILLIN/SULBACTAM 4 SENSITIVE Sensitive     PIP/TAZO <=4 SENSITIVE  Sensitive     * KLEBSIELLA OXYTOCA  Culture, blood (x 2)     Status: None (Preliminary result)   Collection Time: 06/16/15  4:40 PM  Result Value Ref Range Status   Specimen Description BLOOD LEFT ARM  Final   Special Requests IN PEDIATRIC BOTTLE 0 .5CC  Final   Culture   Final    NO GROWTH 3 DAYS Performed at [facility]    Report Status PENDING  Incomplete  Wound culture     Status: None   Collection Time: 06/16/15  5:40 PM  Result Value Ref Range Status   Specimen Description SACRAL  Final   Special Requests NONE  Final   Gram Stain   Final    NO WBC SEEN NO SQUAMOUS EPITHELIAL CELLS SEEN RARE GRAM POSITIVE COCCI IN PAIRS IN CLUSTERS Performed at [facility]    Culture   Final    FEW STAPHYLOCOCCUS AUREUS Note: RIFAMPIN AND GENTAMICIN SHOULD NOT BE USED AS SINGLE DRUGS FOR TREATMENT OF STAPH INFECTIONS. Performed at [facility]    Report Status 06/19/2015 FINAL  Final   Organism ID, Bacteria STAPHYLOCOCCUS AUREUS  Final      Susceptibility   Staphylococcus aureus - MIC*    CLINDAMYCIN <=0.25 SENSITIVE Sensitive     ERYTHROMYCIN <=0.25 SENSITIVE Sensitive     GENTAMICIN <=0.5 SENSITIVE Sensitive     LEVOFLOXACIN <=0.12 SENSITIVE Sensitive     OXACILLIN 0.5 SENSITIVE Sensitive     RIFAMPIN <=0.5 SENSITIVE Sensitive     TRIMETH/SULFA <=10 SENSITIVE Sensitive     VANCOMYCIN <=0.5 SENSITIVE Sensitive     TETRACYCLINE >=16 RESISTANT Resistant     MOXIFLOXACIN <=0.25 SENSITIVE Sensitive     * FEW STAPHYLOCOCCUS AUREUS  Wound culture     Status: None (Preliminary result)   Collection Time:  06/16/15  5:42 PM  Result Value Ref Range Status   Specimen Description FOOT RIGHT  Final   Special Requests NONE  Final   Gram Stain   Final    FEW WBC PRESENT,BOTH PMN AND MONONUCLEAR FEW SQUAMOUS EPITHELIAL CELLS PRESENT MODERATE GRAM NEGATIVE RODS FEW GRAM POSITIVE COCCI IN PAIRS Performed at [facility]    Culture   Final    ABUNDANT STAPHYLOCOCCUS AUREUS Note: RIFAMPIN AND GENTAMICIN SHOULD NOT BE USED AS SINGLE DRUGS FOR TREATMENT OF STAPH INFECTIONS. Performed at [facility]    Report Status PENDING  Incomplete  MRSA PCR Screening     Status: None   Collection Time: 06/17/15 10:10 AM  Result Value Ref Range Status   MRSA by PCR NEGATIVE NEGATIVE Final    Comment:        The GeneXpert MRSA Assay (FDA approved for NASAL specimens only), is one component of a comprehensive MRSA colonization surveillance program. It is not intended to diagnose MRSA infection nor to guide or monitor treatment for MRSA infections.   Culture, Urine     Status: None   Collection Time: 06/17/15 11:53 AM  Result Value Ref Range Status   Specimen Description URINE, RANDOM  Final   Special Requests NONE  Final   Culture   Final    >=100,000 COLONIES/mL GROUP B STREP(S.AGALACTIAE)ISOLATED TESTING AGAINST S. AGALACTIAE NOT ROUTINELY PERFORMED DUE TO PREDICTABILITY OF AMP/PEN/VAN SUSCEPTIBILITY. Performed at [facility]    Report Status 06/19/2015 FINAL  Final  Culture, routine-abscess     Status: None   Collection Time: 06/17/15  1:16 PM  Result Value Ref Range Status  Specimen Description ABSCESS RIGHT PARASPINAL LUMBAR UNDER CT  Final   Special Requests Normal  Final   Gram Stain   Final    ABUNDANT WBC PRESENT, PREDOMINANTLY PMN NO SQUAMOUS EPITHELIAL CELLS SEEN NO ORGANISMS SEEN Performed at [facility]    Culture   Final    MULTIPLE ORGANISMS PRESENT, NONE PREDOMINANT Note: NO STAPHYLOCOCCUS AUREUS ISOLATED NO GROUP A STREP (S.PYOGENES)  ISOLATED Performed at [facility]    Report Status 06/20/2015 FINAL  Final  C difficile quick scan w PCR reflex     Status: None   Collection Time: 06/19/15  7:47 PM  Result Value Ref Range Status   C Diff antigen NEGATIVE NEGATIVE Final   C Diff toxin NEGATIVE NEGATIVE Final   C Diff interpretation Negative for toxigenic C. difficile  Final     Studies: No results found.  Scheduled Meds: . collagenase  1 application Topical Daily  . diphenhydrAMINE  50 mg Oral Once  . feeding supplement (PRO-STAT SUGAR FREE 64)  30 mL Oral BID  . gabapentin  100 mg Oral QHS  . imipenem-cilastatin  250 mg Intravenous 3 times per day  . metoprolol tartrate  12.5 mg Oral BID  . pantoprazole  40 mg Oral Daily  . potassium chloride  40 mEq Oral Once  . sodium bicarbonate  650 mg Oral BID  . sodium chloride  10-40 mL Intracatheter Q12H  . sodium chloride  3 mL Intravenous Q12H    Continuous Infusions: . sodium chloride 10 mL/hr (06/19/15 0541)  . heparin 750 Units/hr (06/19/15 0900)     Time spent: 48mns  Kinzee Happel MD, PhD  [facility] Pager [phone] If 7PM-7AM, please contact night-coverage at [email or web address], password [email or web address]1/17/2017, 11:18 AM  LOS: 4 days

## 2015-06-20 NOTE — Progress Notes (Signed)
ANTICOAGULATION CONSULT NOTE - F/u Consult  Pharmacy Consult for heparin Indication: DVT  Allergies  Allergen Reactions  . Amlodipine Swelling  . Ciprofloxacin Hives and Other (See Comments)    REACTION: weakness  . Mirtazapine Other (See Comments)    Hallucinations and nightmares, verbally aggressive   . Statins Other (See Comments)  . Aspirin Swelling    Mouth swelling, tongue  . Cephalexin Hives  . Hydralazine Nausea And Vomiting  . Iron Nausea And Vomiting  . Lorazepam Other (See Comments)    Hallucinations, verbally aggressive  . Sulfonamide Derivatives Hives  . Penicillins Other (See Comments)    Has patient had a PCN reaction causing immediate rash, facial/tongue/throat swelling, SOB or lightheadedness with hypotension: Yes Has patient had a PCN reaction causing severe rash involving mucus membranes or skin necrosis: Yes Has patient had a PCN reaction that required hospitalization No Has patient had a PCN reaction occurring within the last 10 years: No If all of the above answers are "NO", then may proceed with Cephalosporin use.   . Shellfish Allergy Hives and Rash  . Tape Rash    Patient Measurements: Height: '5\' 8"'$  (172.7 cm) Weight: 132 lb 4.4 oz (60 kg) IBW/kg (Calculated) : 63.9 Heparin Dosing Weight: using actual body weight of 60 kg  Vital Signs: Temp: 97.5 F (36.4 C) (01/17 1014) Temp Source: Oral (01/17 1014) BP: 98/44 mmHg (01/17 1014) Pulse Rate: 71 (01/17 1014)  Labs:  Recent Labs  06/18/15 0430  06/19/15 0555 06/19/15 1859 06/20/15 0515  HGB 7.5*  --  7.2*  --  6.9*  HCT 23.7*  --  22.7*  --  21.9*  PLT 148*  --  157  --  146*  HEPARINUNFRC  --   < > 0.52 0.49 0.42  CREATININE 1.96*  --  1.84*  --  1.79*  TROPONINI  --   --   --   --  <0.03  < > = values in this interval not displayed.  Estimated Creatinine Clearance: 24.1 mL/min (by C-G formula based on Cr of 1.79).   Medical History: Past Medical History  Diagnosis Date  .  Hypertension   . Hypercholesterolemia   . CKD (chronic kidney disease)   . Coronary artery disease     a.  LHC (06/04/05): LHC done in [place] with high grade RCA => s/p BMS to RCA;  b.  Nuclear (09/14/09): Lexiscan; Inf infarct with mild peri-infarct ishemia, EF 52%; Low Risk.  [place] Kitchen GERD (gastroesophageal reflux disease)   . Chronic anemia   . Ischemic cardiomyopathy     a. Echo (07/26/13): Mild LVH, EF 35-40%, diff HK, inf AK, Gr 2 DD, Tr AI, mildly dilated Ao root, MAC, mild MR, mild LAE, mod reduced RVSF.  . Non-small cell carcinoma of lung ([place])     Stage IV  . Chronic fatigue 04/04/2015  . Chronic fatigue 04/04/2015    Medications:  Scheduled:  . collagenase  1 application Topical Daily  . diphenhydrAMINE  50 mg Oral Once  . feeding supplement (PRO-STAT SUGAR FREE 64)  30 mL Oral BID  . gabapentin  100 mg Oral QHS  . imipenem-cilastatin  250 mg Intravenous 3 times per day  . metoprolol tartrate  12.5 mg Oral BID  . pantoprazole  40 mg Oral Daily  . sodium bicarbonate  650 mg Oral BID  . sodium chloride  10-40 mL Intracatheter Q12H  . sodium chloride  3 mL Intravenous Q12H    Assessment: Pt is a  82 yo female diagnosed with DVT noted in the right common femoral vein, right femoral vein, right profunda femoral vein, right popliteal vein, and right proximal peroneal veins. Pt was previously on enoxaparin PTA but has not been on medication for greater than 30 days. Pharmacy consulted to being heparin infusion 1/14 PM.  D/t concern of dropping Hgb levels, omitted heparin bolus & use lower threshold for anticoagulation level (anti-Xa 0.3 to 0.5).   Significant Events: 1/14 am paraspinal fluid aspiration by IR 1/14 pm heparin infusion initiated   Today, 06/20/2015:  CBC: Hgb still dropping slowly, but Plt pretty stable around 150k - patient getting PRBCs today  Heparin level therapeutic on current rate of 750 units/hr - rate confirmed in room  No bleeding or infusion  issues  Goal of Therapy: Heparin level 0.3-0.5 units/ml Monitor platelets by anticoagulation protocol: Yes  Plan:  Continue heparin IV infusion at 750 units/hr  Daily CBC, daily heparin level once stable  Monitor for signs of bleeding or thrombosis   Adrian Saran, PharmD, BCPS Pager [phone] 06/20/2015 10:37 AM

## 2015-06-20 NOTE — Care Management Note (Signed)
Case Management Note  Patient Details  Name: [name] MRN: [id number] Date of Birth: August 07, 1933  Subjective/Objective:   PT-HH. Spoke to dtr on phone-[facility] already active w/HHRn/HHPT. [facility] rep Tim to confirm.  Dtr says able to transp home.               Action/Plan:d/c plan home w/HHC.   Expected Discharge Date:   (unknown)               Expected Discharge Plan:  [place]  In-House Referral:  NA  Discharge planning Services  CM Consult  Post Acute Care Choice:  Home Health (Active w/[facility]-HHRN/HHPT) Choice offered to:     DME Arranged:    DME Agency:     HH Arranged:    HH Agency:     Status of Service:  In process, will continue to follow  Medicare Important Message Given:  Yes Date Medicare IM Given:    Medicare IM give by:    Date Additional Medicare IM Given:    Additional Medicare Important Message give by:     If discussed at [place] of Stay Meetings, dates discussed:    Additional Comments:  Dessa Phi, RN 06/20/2015, 1:57 PM

## 2015-06-20 NOTE — Consult Note (Signed)
[facility] for Infectious Disease  Total days of antibiotics 5        Day 2 imipenemn               Reason for Consult: numerous infections, including GNR bacteremia   Referring Physician: Erlinda Hong  Active Problems:   Pressure ulcer   Non-small cell lung cancer ([place])   Paraspinal mass   Metastatic squamous cell carcinoma to bone (HCC)   Hypotension   Severe protein-calorie malnutrition ([place])   Hypothermia   Paraspinal abscess (HCC)   Malnutrition of moderate degree   Abscess   Edema   Sepsis ([place])    HPI: [name] is a 82 y.o. female with hx of HTN, CKD, CAD, GERD, and stage IV NSCLC initially diagnosed in 2007, with disease recurrent in 11/2014, s/p XRT to right paraspinous mass with osseous invasion  , finished on 12/23/2014, she was then treated with systemic chemo with [place] and paclitaxel which was stopped after two cycles due to intolerance. She is now getting immunotherapy with PD1 inhibitor, last on 12/23. She went to oncology clinic for follow up on 1/13 but ws found to be  hypothermia and hypotension, in setting of having diarrhea for the last two weeks, denies ab pain, no n/v, she has a chronic sacral decubitus ulcer and chronic right lower leg wound which are care by home healthcare. In ED, her WBC was 9.7 with 90%N.   She was admitted on 1/13 for evaluation of hypothermia, possibly sepsis due to hypotension. She was found to have polymicrobial bacteremia with pansensitvie klebsiella oxytoca nad enterobacter cloacae (possibly gi source) in 1 of 2 sets of blood cx. Sacral wound cx shows MSSA. Right foot cx MSSA. Urine cx GBS with reports of mild dysuria. On 1/14 she had paraspinal lumbar abscess aspirate that was found to be polymicrobial. The patient is now on imipenem to cover her cultures. She has portacath in place as well as ureter J-stent  She has been seen by wound care RN on 1/14 to assess non-healing Stage 4 wound to sacrum, Stage 3 to right hip and right  lateral malleolus She measured Sacral Stage 4: 5.5cm x 5.5cm x 5.5cm in center tunnel and 0.4cm overall depth. Right hip: 4cm x 2cm area with two Stage 3 pressure injuries within. The distal wound is the largest and measures 2cm x 2xm x 0.2cm. Right lateral malleolus, Stage 3: 0.5cm round x 0.2cm deep.  Large amount of light yellow exudate from sacrum; small amounts of light yellow exudate from right hip and right lateral malleolus Orders to pad, protect the right hip and right lateral malleolus. For the sacral wound, we will fill the defect with a ribbon of silver hydrofiber (Aquacel Ag+), top the wound will silver hydrofiber and cover all with a soft silicone foam dressing. A therapeutic mattress will be provided upon transfer to the floor; the patient is currently on a therapeutic mattress with low air loss feature in ICU but this bed must stay here upon transfer.  Patient is quite frail, easily bruises per her daughter.  Patient reports angio edema like symptoms with penicillin and possibly keflex, though tolerating imipenem  Past Medical History  Diagnosis Date  . Hypertension   . Hypercholesterolemia   . CKD (chronic kidney disease)   . Coronary artery disease     a.  LHC (06/04/05): LHC done in [place] with high grade RCA => s/p BMS to RCA;  b.  Nuclear (09/14/09): Lexiscan;  Inf infarct with mild peri-infarct ishemia, EF 52%; Low Risk.  Marland Kitchen GERD (gastroesophageal reflux disease)   . Chronic anemia   . Ischemic cardiomyopathy     a. Echo (07/26/13): Mild LVH, EF 35-40%, diff HK, inf AK, Gr 2 DD, Tr AI, mildly dilated Ao root, MAC, mild MR, mild LAE, mod reduced RVSF.  . Non-small cell carcinoma of lung ([place])     Stage IV  . Chronic fatigue 04/04/2015  . Chronic fatigue 04/04/2015    Allergies:  Allergies  Allergen Reactions  . Amlodipine Swelling  . Ciprofloxacin Hives and Other (See Comments)    REACTION: weakness  . Mirtazapine Other (See Comments)    Hallucinations  and nightmares, verbally aggressive   . Statins Other (See Comments)  . Aspirin Swelling    Mouth swelling, tongue  . Cephalexin Hives  . Hydralazine Nausea And Vomiting  . Iron Nausea And Vomiting  . Lorazepam Other (See Comments)    Hallucinations, verbally aggressive  . Sulfonamide Derivatives Hives  . Penicillins Other (See Comments)    Has patient had a PCN reaction causing immediate rash, facial/tongue/throat swelling, SOB or lightheadedness with hypotension: Yes Has patient had a PCN reaction causing severe rash involving mucus membranes or skin necrosis: Yes Has patient had a PCN reaction that required hospitalization No Has patient had a PCN reaction occurring within the last 10 years: No If all of the above answers are "NO", then may proceed with Cephalosporin use.   . Shellfish Allergy Hives and Rash  . Tape Rash    MEDICATIONS: . collagenase  1 application Topical Daily  . diphenhydrAMINE  50 mg Oral Once  . feeding supplement (PRO-STAT SUGAR FREE 64)  30 mL Oral BID  . gabapentin  100 mg Oral QHS  . imipenem-cilastatin  250 mg Intravenous 3 times per day  . metoprolol tartrate  12.5 mg Oral BID  . pantoprazole  40 mg Oral Daily  . sodium bicarbonate  650 mg Oral BID  . sodium chloride  10-40 mL Intracatheter Q12H  . sodium chloride  3 mL Intravenous Q12H    Social History  Substance Use Topics  . Smoking status: Former Smoker    Types: Cigarettes  . Smokeless tobacco: Never Used  . Alcohol Use: No    Family History  Problem Relation Age of Onset  . Heart failure Mother   . Lung cancer Sister   . Lung cancer Brother   . Stomach cancer Brother   . Heart attack Neg Hx   . Stroke Neg Hx     Review of Systems -  Constitutional: Negative for fever, chills, diaphoresis, activity change, appetite change, fatigue and unexpected weight change.  HENT: Negative for congestion, sore throat, rhinorrhea, sneezing, trouble swallowing and sinus pressure.  Eyes:  Negative for photophobia and visual disturbance.  Respiratory: Negative for cough, chest tightness, shortness of breath, wheezing and stridor.  Cardiovascular: Negative for chest pain, palpitations and leg swelling.  Gastrointestinal: positive for diarrhea that goes into wound at home. Negative for nausea, vomiting, abdominal pain, constipation, blood in stool, abdominal distention and anal bleeding.  Genitourinary: Negative for dysuria, hematuria, flank pain and difficulty urinating.  Musculoskeletal: Negative for myalgias, back pain, joint swelling, arthralgias and gait problem.  Skin: Negative for color change, pallor, rash and wound.  Neurological: Negative for dizziness, tremors, weakness and light-headedness.  Hematological:  bruise/bleed easily.  Psychiatric/Behavioral: Negative for behavioral problems, confusion, sleep disturbance, dysphoric mood, decreased concentration and agitation.  OBJECTIVE: Temp:  [97.3 F (36.3 C)-97.7 F (36.5 C)] 97.4 F (36.3 C) (01/17 1249) Pulse Rate:  [68-80] 77 (01/17 1249) Resp:  [14-18] 18 (01/17 1249) BP: (98-134)/(44-78) 123/78 mmHg (01/17 1249) SpO2:  [98 %-100 %] 100 % (01/17 1249) Physical Exam  Constitutional:  oriented to person, place,  appears frail, age appropriate, thin/emaciate. No distress.  HENT: [place]/AT, PERRLA, no scleral icterus Mouth/Throat: Oropharynx is clear and moist. No oropharyngeal exudate.  Cardiovascular: Normal rate, regular rhythm and normal heart sounds. Exam reveals no gallop and no friction rub.  No murmur heard.  Pulmonary/Chest: Effort normal and breath sounds normal. No respiratory distress.  has no wheezes.  Neck = supple, no nuchal rigidity Abdominal: Soft. Bowel sounds are normal.  exhibits no distension. There is no tenderness.  Lymphadenopathy: no cervical adenopathy. No axillary adenopathy Neurological: alert and oriented to person, place, and time.  Skin: numerous echymosis throughout torso and  extremities with skin tear to left fore arm, left shoulder  Sacrum = large wound stage 4 with fibrinous debris at base  Ext: left heel pressure ulcer, as described above Psychiatric: a normal mood and affect.  behavior is normal.    LABS: Results for orders placed or performed during the hospital encounter of 06/16/15 (from the past 48 hour(s))  Heparin level (unfractionated)     Status: None   Collection Time: 06/18/15 10:30 PM  Result Value Ref Range   Heparin Unfractionated 0.59 0.30 - 0.70 IU/mL    Comment:        IF HEPARIN RESULTS ARE BELOW EXPECTED VALUES, AND PATIENT DOSAGE HAS BEEN CONFIRMED, SUGGEST FOLLOW UP TESTING OF ANTITHROMBIN III LEVELS.   CBC     Status: Abnormal   Collection Time: 06/19/15  5:55 AM  Result Value Ref Range   WBC 4.7 4.0 - 10.5 K/uL   RBC 2.63 (L) 3.87 - 5.11 MIL/uL   Hemoglobin 7.2 (L) 12.0 - 15.0 g/dL   HCT 22.7 (L) 36.0 - 46.0 %   MCV 86.3 78.0 - 100.0 fL   MCH 27.4 26.0 - 34.0 pg   MCHC 31.7 30.0 - 36.0 g/dL   RDW 15.2 11.5 - 15.5 %   Platelets 157 150 - 400 K/uL  Basic metabolic panel     Status: Abnormal   Collection Time: 06/19/15  5:55 AM  Result Value Ref Range   Sodium 139 135 - 145 mmol/L   Potassium 3.7 3.5 - 5.1 mmol/L   Chloride 113 (H) 101 - 111 mmol/L   CO2 20 (L) 22 - 32 mmol/L   Glucose, Bld 82 65 - 99 mg/dL   BUN 35 (H) 6 - 20 mg/dL   Creatinine, Ser 1.84 (H) 0.44 - 1.00 mg/dL   Calcium 7.0 (L) 8.9 - 10.3 mg/dL   GFR calc non Af Amer 25 (L) >60 mL/min   GFR calc Af Amer 29 (L) >60 mL/min    Comment: (NOTE) The eGFR has been calculated using the CKD EPI equation. This calculation has not been validated in all clinical situations. eGFR's persistently <60 mL/min signify possible Chronic Kidney Disease.    Anion gap 6 5 - 15  Heparin level (unfractionated)     Status: None   Collection Time: 06/19/15  5:55 AM  Result Value Ref Range   Heparin Unfractionated 0.52 0.30 - 0.70 IU/mL    Comment:        IF HEPARIN  RESULTS ARE BELOW EXPECTED VALUES, AND PATIENT DOSAGE HAS BEEN CONFIRMED, SUGGEST FOLLOW UP  TESTING OF ANTITHROMBIN III LEVELS.   Heparin level (unfractionated)     Status: None   Collection Time: 06/19/15  6:59 PM  Result Value Ref Range   Heparin Unfractionated 0.49 0.30 - 0.70 IU/mL    Comment:        IF HEPARIN RESULTS ARE BELOW EXPECTED VALUES, AND PATIENT DOSAGE HAS BEEN CONFIRMED, SUGGEST FOLLOW UP TESTING OF ANTITHROMBIN III LEVELS.   C difficile quick scan w PCR reflex     Status: None   Collection Time: 06/19/15  7:47 PM  Result Value Ref Range   C Diff antigen NEGATIVE NEGATIVE   C Diff toxin NEGATIVE NEGATIVE   C Diff interpretation Negative for toxigenic C. difficile   Occult blood card to lab, stool     Status: Abnormal   Collection Time: 06/19/15  7:49 PM  Result Value Ref Range   Fecal Occult Bld POSITIVE (A) NEGATIVE  CBC     Status: Abnormal   Collection Time: 06/20/15  5:15 AM  Result Value Ref Range   WBC 4.1 4.0 - 10.5 K/uL   RBC 2.52 (L) 3.87 - 5.11 MIL/uL   Hemoglobin 6.9 (LL) 12.0 - 15.0 g/dL    Comment: REPEATED TO VERIFY CRITICAL RESULT CALLED TO, READ BACK BY AND VERIFIED WITH: T. RYAN RN AT 0640 ON 1.17.17 BY SHUEA    HCT 21.9 (L) 36.0 - 46.0 %   MCV 86.9 78.0 - 100.0 fL   MCH 27.4 26.0 - 34.0 pg   MCHC 31.5 30.0 - 36.0 g/dL   RDW 15.2 11.5 - 15.5 %   Platelets 146 (L) 150 - 400 K/uL  Basic metabolic panel     Status: Abnormal   Collection Time: 06/20/15  5:15 AM  Result Value Ref Range   Sodium 138 135 - 145 mmol/L   Potassium 3.2 (L) 3.5 - 5.1 mmol/L   Chloride 111 101 - 111 mmol/L   CO2 19 (L) 22 - 32 mmol/L   Glucose, Bld 68 65 - 99 mg/dL   BUN 38 (H) 6 - 20 mg/dL   Creatinine, Ser 1.79 (H) 0.44 - 1.00 mg/dL   Calcium 6.9 (L) 8.9 - 10.3 mg/dL   GFR calc non Af Amer 26 (L) >60 mL/min   GFR calc Af Amer 30 (L) >60 mL/min    Comment: (NOTE) The eGFR has been calculated using the CKD EPI equation. This calculation has not been  validated in all clinical situations. eGFR's persistently <60 mL/min signify possible Chronic Kidney Disease.    Anion gap 8 5 - 15  Heparin level (unfractionated)     Status: None   Collection Time: 06/20/15  5:15 AM  Result Value Ref Range   Heparin Unfractionated 0.42 0.30 - 0.70 IU/mL    Comment:        IF HEPARIN RESULTS ARE BELOW EXPECTED VALUES, AND PATIENT DOSAGE HAS BEEN CONFIRMED, SUGGEST FOLLOW UP TESTING OF ANTITHROMBIN III LEVELS.   Troponin I     Status: None   Collection Time: 06/20/15  5:15 AM  Result Value Ref Range   Troponin I <0.03 <0.031 ng/mL    Comment:        NO INDICATION OF MYOCARDIAL INJURY.   Prepare RBC     Status: None   Collection Time: 06/20/15  7:30 AM  Result Value Ref Range   Order Confirmation ORDER PROCESSED BY BLOOD BANK   Type and screen [place]     Status: None (Preliminary result)  Collection Time: 06/20/15  8:30 AM  Result Value Ref Range   ABO/RH(D) O POS    Antibody Screen NEG    Sample Expiration 06/23/2015    Unit Number [id number]    Blood Component Type RED CELLS,LR    Unit division 00    Status of Unit ISSUED    Transfusion Status OK TO TRANSFUSE    Crossmatch Result Compatible     MICRO: Listed above IMAGING: Reviewed CT imaging that showed lumbar paraspinal abscess  Assessment/Plan: 82yo F with  HTN, CKD, CAD, GERD, and stage IV NSCLC involving lumbar spine, debilitated who has sacral, right hip, and left heel pressure ulcers presents with sepsis (hypotension, hypothermia) found to have polymicrobial bacteremia in setting of 2 wk fo watery diarrhea as well as polymicrobial paraspinal abscess s/p aspiration. Wound as colonized with MSSA. +/- GBS uti. She has portacath but only had 1 of 2 blood cx with GNR.  Polymicrobial bacteremia with secondary paraspinal abscess = would suspect that she possibly had gram negative pathogen translocate to bloodstream and causes 2.4 cm paraspinal abscess.  Recommend to treat for minimum of 2 wk possibly 4 wk. Continue with imipenem due to her pcn and cephalosporin allergies.  i am concerned that her portacath may also be seeded with klebsiella and enterobacter, though only 1 of 2 sets +. Abtx recommendation is to treat with 2-4 wk of abtx, adn repeat blood cx off of abtx to see if need to pull portacath. Will discuss with dr Erlinda Hong weighing pros/cons to pulling portacath. She is very frail and not sure if she would fair ok with procedure  Decub ulcers stage 4 and stage 3 = would need to ensure she has the proper home health to care for wounds, proper air mattress for home hospital bed to help with improvement possibly. She is colonized with MSSA and would get some benefit from tx bacteremia. Wound care is key especially to keep feculant material out of the wound  Diarrhea = cdiff is ruled out. Can do a trial of immodium or lomotil to see if it helps her symptoms   NSCLC = defer to oncology for further [place]. [facility] for Infectious Diseases [phone]

## 2015-06-21 LAB — CBC
HCT: 27.4 % — ABNORMAL LOW (ref 36.0–46.0)
HEMOGLOBIN: 8.7 g/dL — AB (ref 12.0–15.0)
MCH: 27.9 pg (ref 26.0–34.0)
MCHC: 31.8 g/dL (ref 30.0–36.0)
MCV: 87.8 fL (ref 78.0–100.0)
Platelets: 161 10*3/uL (ref 150–400)
RBC: 3.12 MIL/uL — ABNORMAL LOW (ref 3.87–5.11)
RDW: 14.9 % (ref 11.5–15.5)
WBC: 5 10*3/uL (ref 4.0–10.5)

## 2015-06-21 LAB — WOUND CULTURE

## 2015-06-21 LAB — TYPE AND SCREEN
ABO/RH(D): O POS
Antibody Screen: NEGATIVE
Unit division: 0

## 2015-06-21 LAB — BASIC METABOLIC PANEL
Anion gap: 6 (ref 5–15)
BUN: 41 mg/dL — AB (ref 6–20)
CHLORIDE: 114 mmol/L — AB (ref 101–111)
CO2: 21 mmol/L — ABNORMAL LOW (ref 22–32)
Calcium: 7.5 mg/dL — ABNORMAL LOW (ref 8.9–10.3)
Creatinine, Ser: 1.89 mg/dL — ABNORMAL HIGH (ref 0.44–1.00)
GFR calc Af Amer: 28 mL/min — ABNORMAL LOW (ref >60)
GFR calc non Af Amer: 24 mL/min — ABNORMAL LOW (ref >60)
GLUCOSE: 78 mg/dL (ref 65–99)
POTASSIUM: 3.5 mmol/L (ref 3.5–5.1)
Sodium: 141 mmol/L (ref 135–145)

## 2015-06-21 LAB — CULTURE, BLOOD (ROUTINE X 2): CULTURE: NO GROWTH

## 2015-06-21 LAB — HEPARIN LEVEL (UNFRACTIONATED): Heparin Unfractionated: 0.33 IU/mL (ref 0.30–0.70)

## 2015-06-21 MED ORDER — LOPERAMIDE HCL 2 MG PO CAPS
2.0000 mg | ORAL_CAPSULE | Freq: Every day | ORAL | Status: DC
  Administered 2015-06-21 – 2015-06-23 (×3): 2 mg via ORAL
  Filled 2015-06-21 (×3): qty 1

## 2015-06-21 NOTE — Progress Notes (Signed)
ANTICOAGULATION CONSULT NOTE - F/u Consult  Pharmacy Consult for heparin Indication: DVT  Allergies  Allergen Reactions  . Amlodipine Swelling  . Ciprofloxacin Hives and Other (See Comments)    REACTION: weakness  . Mirtazapine Other (See Comments)    Hallucinations and nightmares, verbally aggressive   . Statins Other (See Comments)  . Aspirin Swelling    Mouth swelling, tongue  . Cephalexin Hives  . Hydralazine Nausea And Vomiting  . Iron Nausea And Vomiting  . Lorazepam Other (See Comments)    Hallucinations, verbally aggressive  . Sulfonamide Derivatives Hives  . Penicillins Other (See Comments)    Has patient had a PCN reaction causing immediate rash, facial/tongue/throat swelling, SOB or lightheadedness with hypotension: Yes Has patient had a PCN reaction causing severe rash involving mucus membranes or skin necrosis: Yes Has patient had a PCN reaction that required hospitalization No Has patient had a PCN reaction occurring within the last 10 years: No If all of the above answers are "NO", then may proceed with Cephalosporin use.   . Shellfish Allergy Hives and Rash  . Tape Rash    Patient Measurements: Height: '5\' 8"'$  (172.7 cm) Weight: 132 lb 4.4 oz (60 kg) IBW/kg (Calculated) : 63.9 Heparin Dosing Weight: using actual body weight of 60 kg  Vital Signs: Temp: 98.4 F (36.9 C) (01/18 0515) Temp Source: Oral (01/18 0515) BP: 118/55 mmHg (01/18 0515) Pulse Rate: 70 (01/18 0515)  Labs:  Recent Labs  06/19/15 0555 06/19/15 1859 06/20/15 0515 06/20/15 1704 06/20/15 2315 06/21/15 0537  HGB 7.2*  --  6.9* 9.3* 8.6* 8.7*  HCT 22.7*  --  21.9* 28.9* 26.8* 27.4*  PLT 157  --  146* 163 145* 161  HEPARINUNFRC 0.52 0.49 0.42  --   --  0.33  CREATININE 1.84*  --  1.79*  --   --  1.89*  TROPONINI  --   --  <0.03  --   --   --     Estimated Creatinine Clearance: 22.9 mL/min (by C-G formula based on Cr of 1.89).   Medical History: Past Medical History   Diagnosis Date  . Hypertension   . Hypercholesterolemia   . CKD (chronic kidney disease)   . Coronary artery disease     a.  LHC (06/04/05): LHC done in [place] with high grade RCA => s/p BMS to RCA;  b.  Nuclear (09/14/09): Lexiscan; Inf infarct with mild peri-infarct ishemia, EF 52%; Low Risk.  Marland Kitchen GERD (gastroesophageal reflux disease)   . Chronic anemia   . Ischemic cardiomyopathy     a. Echo (07/26/13): Mild LVH, EF 35-40%, diff HK, inf AK, Gr 2 DD, Tr AI, mildly dilated Ao root, MAC, mild MR, mild LAE, mod reduced RVSF.  . Non-small cell carcinoma of lung ([place])     Stage IV  . Chronic fatigue 04/04/2015  . Chronic fatigue 04/04/2015    Medications:  Scheduled:  . collagenase  1 application Topical Daily  . diphenhydrAMINE  50 mg Oral Once  . feeding supplement (PRO-STAT SUGAR FREE 64)  30 mL Oral BID  . gabapentin  100 mg Oral QHS  . imipenem-cilastatin  250 mg Intravenous 3 times per day  . metoprolol tartrate  12.5 mg Oral BID  . pantoprazole  40 mg Oral Daily  . sodium bicarbonate  650 mg Oral BID  . sodium chloride  10-40 mL Intracatheter Q12H  . sodium chloride  3 mL Intravenous Q12H    Assessment: Pt is a 82  yo female diagnosed with DVT noted in the right common femoral vein, right femoral vein, right profunda femoral vein, right popliteal vein, and right proximal peroneal veins. Pt was previously on enoxaparin PTA but has not been on medication for greater than 30 days. Pharmacy consulted to being heparin infusion 1/14 PM.  D/t concern of dropping Hgb levels, omitted heparin bolus & use lower threshold for anticoagulation level (anti-Xa 0.3 to 0.5).   Significant Events: 1/14 am paraspinal fluid aspiration by IR 1/14 pm heparin infusion initiated   Today, 06/21/2015:  CBC: Hgb 8.7, Plt pretty stable 161k - patient received PRBCs yesterday  Heparin level therapeutic on current rate of 750 units/hr   No bleeding or infusion issues  Goal of  Therapy: Heparin level 0.3-0.5 units/ml Monitor platelets by anticoagulation protocol: Yes  Plan:  Continue heparin IV infusion at 750 units/hr  Daily CBC, daily heparin level   Monitor for signs of bleeding or thrombosis   Dolly Rias RPh 06/21/2015, 7:21 AM Pager [phone]

## 2015-06-21 NOTE — Progress Notes (Signed)
[facility] for Infectious Disease    Date of Admission:  06/16/2015   Total days of antibiotics 6        Day 3 imipenem           ID: [name] is a 82 y.o. female with  HTN, CKD, CAD, GERD, and stage IV NSCLC involving lumbar spine, debilitated who has sacral, right hip, and left heel pressure ulcers presents with sepsis (hypotension, hypothermia) found to have polymicrobial bacteremia in setting of 2 wk fo watery diarrhea as well as polymicrobial paraspinal abscess s/p aspiration. Wound as colonized with MSSA. +/- GBS uti. She has portacath but only had 1 of 2 blood cx with GNR. Active Problems:   Pressure ulcer   Non-small cell lung cancer (HCC)   Paraspinal mass   Metastatic squamous cell carcinoma to bone (HCC)   Hypotension   Severe protein-calorie malnutrition (HCC)   Hypothermia   Paraspinal abscess (HCC)   Malnutrition of moderate degree   Abscess   Edema   Sepsis (HCC)    Subjective: Feeling better, though have frequent BM, had 5 last night per her report  Medications:  . collagenase  1 application Topical Daily  . diphenhydrAMINE  50 mg Oral Once  . feeding supplement (PRO-STAT SUGAR FREE 64)  30 mL Oral BID  . gabapentin  100 mg Oral QHS  . imipenem-cilastatin  250 mg Intravenous 3 times per day  . metoprolol tartrate  12.5 mg Oral BID  . pantoprazole  40 mg Oral Daily  . sodium bicarbonate  650 mg Oral BID  . sodium chloride  10-40 mL Intracatheter Q12H  . sodium chloride  3 mL Intravenous Q12H    Objective: Vital signs in last 24 hours: Temp:  [97.4 F (36.3 C)-98.4 F (36.9 C)] 97.4 F (36.3 C) (01/18 0955) Pulse Rate:  [70-80] 80 (01/18 0955) Resp:  [17-18] 17 (01/18 0955) BP: (118-120)/(55-63) 119/63 mmHg (01/18 0955) SpO2:  [98 %-100 %] 100 % (01/18 0955) Constitutional: oriented to person, place, appears frail, age appropriate, thin/emaciate. No distress.  HENT: [place]/AT, PERRLA, no scleral icterus Mouth/Throat: Oropharynx is clear  and moist. No oropharyngeal exudate.  Cardiovascular: Normal rate, regular rhythm and normal heart sounds. Exam reveals no gallop and no friction rub.  No murmur heard.  Pulmonary/Chest: Effort normal and breath sounds normal. No respiratory distress. has no wheezes.  Neck = supple, no nuchal rigidity Abdominal: Soft. Bowel sounds are normal. exhibits no distension. There is no tenderness.  Lymphadenopathy: no cervical adenopathy. No axillary adenopathy Neurological: alert and oriented to person, place, and time.  Skin: numerous echymosis throughout torso and extremities with skin tear to left fore arm, left shoulder  Sacrum = large wound stage 4 with fibrinous debris at base  Ext: left heel pressure ulcer, as described above Psychiatric: a normal mood and affect. behavior is normal.     Lab Results  Recent Labs  06/20/15 0515  06/20/15 2315 06/21/15 0537  WBC 4.1  < > 4.2 5.0  HGB 6.9*  < > 8.6* 8.7*  HCT 21.9*  < > 26.8* 27.4*  NA 138  --   --  141  K 3.2*  --   --  3.5  CL 111  --   --  114*  CO2 19*  --   --  21*  BUN 38*  --   --  41*  CREATININE 1.79*  --   --  1.89*  < > = values  in this interval not displayed. Lab Results  Component Value Date   ESRSEDRATE 17 06/16/2015   Lab Results  Component Value Date   CRP 11.0* 06/16/2015    Assessment/Plan: 82yo F with HTN, CKD, CAD, GERD, and stage IV NSCLC involving lumbar spine, debilitated who has sacral, right hip, and left heel pressure ulcers presents with sepsis (hypotension, hypothermia) found to have polymicrobial bacteremia in setting of 2 wk fo watery diarrhea as well as polymicrobial paraspinal abscess s/p aspiration. Wound as colonized with MSSA. +/- GBS uti. She has portacath but only had 1 of 2 blood cx with GNR. Complains of ongoing diarrhea  Diarrhea = cdiff is ruled out. Recommend to schedule immodium 1-2 x per day to see if it helps her symptoms  Polymicrobial bacteremia with secondary  paraspinal abscess = would suspect that she possibly had gram negative pathogen translocate to bloodstream and causes 2.4 cm paraspinal abscess. Recommend to treat for minimum of 2 wk possibly 4 wk. Continue with imipenem due to her pcn and cephalosporin allergies.   her portacath may also be seeded with klebsiella and enterobacter, though only 1 of 2 sets +. Abtx recommendation is to treat with 2-4 wk of abtx, adn repeat blood cx off of abtx to see if need to pull portacath.   Decub ulcers stage 4 and stage 3 = would need to ensure she has the proper home health to care for wounds, proper air mattress for home hospital bed to help with improvement possibly. She is colonized with MSSA and would get some benefit from tx bacteremia. Wound care is key especially to keep feculant material out of the wound  NSCLC = defer to oncology for further [facility] for Infectious Diseases Cell: [phone] Pager: [phone]  06/21/2015, 1:27 PM

## 2015-06-21 NOTE — Progress Notes (Signed)
Patient ID: [name], female   DOB: [birth year], 82 y.o.   MRN: [id number]  [facility] PROGRESS NOTE  [name] MOQ:[id number] DOB: August 16, 1933 DOA: 06/16/2015 PCP: Wende Neighbors, MD   Brief narrative:    Pt is 82 yo female with HT, CAD s/p stent in 2007, h/o chronic systolic chf, not a cnadidate for ace/arb due to ckd IV, she has been taken off blood pressure meds in 03/2015 due to progressive weakness and low bp, h/o stage IV NSCLC initially diagnosed in 2007, with disease recurrent in 11/2014, s/p XRT to right paraspinous mass with osseous invasion at L5 , finished on 12/23/2014, she was then treated with systemic chemo with [place] and paclitaxel which was stopped after two cycles due to intolerance, and mild disease progression, she is currently under immunotherapy with PD1 inhibitor, last on 12/23. She was sent from oncology clinic for evaluation of hypothermia and hypotension, diarrhea for the last two weeks.  Assessment/Plan:    Hypothermia/hypotension/sepsis - present on admission 1/13 with multiple possible source including sacral wound, right hip wound, paraspinal abscess, UTI - polymicrobial paraspinal abscess, s/p aspiration, wound colonized with MSSA, per ID recommend to treat for 2-4 weeks - GBS UTI, GBS ruled out . Sepsis order set placed, culture obtained, empirically start on vanc/aztreonam, IV vanc d/c 1/16,  - ABX changed to Primaxin per ID  - appreciate assistance of ID team  - per ID, portacath may also be seeded with klebsiella and enterobacter, though only 1 of 2 sets +. - Abtx recommendation is to treat with 2-4 wk of abtx, and repeat blood cx off of abtx to see if need to pull portacath.   Paraspinal fluids collection/abscess - CT guided aspiration of the paraspinal fluids on 1/14, ABX as noted above per ID team  Sacral wound: chronic  - per daughter, patient was recently released from the wound care center, has been getting wound care by home health  RN.  - per surgery, Dr. Hassell Done on 1/15, continue conservative management for now, if patient become septic again, may consider formal general surgery consult.  Right leg wound - right leg xray with extensive soft tissue swelling, edema vs infection, marked osteropenia? Osseous metastases - continue with wound care  - Lower extremity venous US + acute DVT.  Diarrhea - imodium as needed   Bilateral pleural effusion - echocardiogram lvef 50-55% with grade i diastolic dysfunction - monitor volume status   CKD III/IV with history of right sided hydronephrosis - s/p percutaneous nephrostomy tube,in 11/2014 which later removed  - BMP in AM  Bilateral pedal edema - right lower extremity edema, H/o bilateral lower extremity DVT: was on lovenox , per daughter lovenox discontinued  Acute DVT - right common femoral vein, right femoral vein, right profunda femoral vein, right popliteal vein, and right proximal peroneal veins. - heparin drip vs ivc filter? Patient was on lovenox for previous DVt, but was taken off lovenox two months ago for unclear reason. - 1/14 started heparin drip for now, monitor hgb and sign.  - Primary hematology/oncologist Dr Julien Nordmann consulted, he recommended lovenox  Stage IV lung cancer - Dr Julien Nordmann consulted.  Anemia, likely anemia of chronic disease - Hg stable in the past 24 hours   CAD s/p stent in 2007 - h/o chronic systolic chf: echo pending  , H/o HTN, essential  - has been taken off bp meds since 03/2015 due to progressive weakness and low bp - started low dose lopressor on 1/15  Report new onset of afib  - on night of 1/16 to 1/17 - on heparin drip, continue betablocker, keep k>4, mag >2. Echo result as mentioned above.  Moderate to Severe malnutrition - bedbound status, very low amount of subc fat on CT scan, recent weight loss, nutrition consulted   DVT prophylaxis - on heparin drip   Code Status: Full.  Family Communication:  plan of care  discussed with the patient Disposition Plan: Home when infection resolved and cleared by ID  IV access:  Peripheral IV  Procedures and diagnostic studies:    Ct Abdomen Pelvis Chest Wo Contrast 06/14/2015  New small to moderate bilateral effusions. Associated collapse/consolidation in the right lower lobe, possibly due to pneumonia. Underlying disease recurrence cannot be excluded. 2. Spiculated left upper lobe nodule is stable to minimally decreased in size. Additional scattered pulmonary nodules in the right lung are better seen on the prior exam. 3. Stable osseous metastatic disease. 4. Thick walled collection of fluid and air in the paraspinous musculature overlying the right aspect of the lumbar spine, largely new from 03/29/2015. Finding may be due to an abscess. Please correlate clinically. 5. Heterogeneous low-attenuation lesion in the upper pole right kidney, stable but indeterminate. 6. Ascending aortic aneurysm. Small suprarenal abdominal aortic aneurysm.   Ct Aspiration 06/17/2015 Technically successful CT-guided aspiration of right paraspinal collection. E  Dg Chest Port 1 View 06/16/2015  No significant change when compared to the recent chest CT. 2. Bilateral pleural effusions with associated lung base atelectasis. No convincing pneumonia or edema. 1 cm left upper lobe spiculated nodule.   Dg Foot 2 Views Right 06/16/2015 Moderate osteopenia with diffuse heterogeneity of the marrow. This likely represents osseous metastases. Similar appearance could be seen with severe osteopenia. 2. Extensive soft tissue swelling. This may represent edema or infection. There is no underlying bone destruction.   Medical Consultants:  oncology Dr Benay Spice ( over the phone on 1/13) , Dr Alen Blew on 1/14, primary oncologist Dr. Julien Nordmann notified on Monday 1/16. IR for paraspinous fluids collection drain ( talked To Dr. Kathlene Cote) general surgery Dr Hassell Done on 1/15, please see detail above. Infectious  disease   Faye Ramsay, MD  [facility] Pager [phone]  If 7PM-7AM, please contact night-coverage [email or web address] Password [facility] 06/21/2015, 6:58 PM   LOS: 5 days   HPI/Subjective: No events overnight.   Objective: Filed Vitals:   06/20/15 2100 06/21/15 0515 06/21/15 0955 06/21/15 1400  BP: 120/55 118/55 119/63 120/58  Pulse: 80 70 80 79  Temp: 98.1 F (36.7 C) 98.4 F (36.9 C) 97.4 F (36.3 C) 97.3 F (36.3 C)  TempSrc: Oral Oral Oral Oral  Resp: '18 18 17 18  '$ Height:      Weight:      SpO2: 99% 98% 100% 99%    Intake/Output Summary (Last 24 hours) at 06/21/15 1858 Last data filed at 06/21/15 1041  Gross per 24 hour  Intake 118.25 ml  Output    651 ml  Net -532.75 ml    Exam:   General:  Pt is alert, follows commands appropriately, not in acute distress  Cardiovascular: Regular rate and rhythm, S1/S2, no murmurs, no rubs, no gallops  Respiratory: Clear to auscultation bilaterally, no wheezing, no crackles, no rhonchi  Data Reviewed: Basic Metabolic Panel:  Recent Labs Lab 06/17/15 0315 06/18/15 0430 06/19/15 0555 06/20/15 0515 06/21/15 0537  NA 132* 136 139 138 141  K 3.7 3.9 3.7 3.2* 3.5  CL 105 107 113* 111 114*  CO2 18*  19* 20* 19* 21*  GLUCOSE 98 74 82 68 78  BUN 32* 31* 35* 38* 41*  CREATININE 2.00* 1.96* 1.84* 1.79* 1.89*  CALCIUM 6.8* 6.8* 7.0* 6.9* 7.5*   Liver Function Tests:  Recent Labs Lab 06/16/15 0904 06/16/15 1710 06/17/15 0315  AST '15 20 16  '$ ALT <9 11* 10*  ALKPHOS 164* 152* 134*  BILITOT 0.38 0.7 0.6  PROT 4.5* 4.3* 3.8*  ALBUMIN 1.5* 1.5* 1.3*   No results for input(s): LIPASE, AMYLASE in the last 168 hours. No results for input(s): AMMONIA in the last 168 hours. CBC:  Recent Labs Lab 06/16/15 0904 06/16/15 1710  06/19/15 0555 06/20/15 0515 06/20/15 1704 06/20/15 2315 06/21/15 0537  WBC 5.1 9.7  < > 4.7 4.1 6.6 4.2 5.0  NEUTROABS 3.5 9.1*  --   --   --   --   --   --   HGB 9.1* 8.7*  < > 7.2* 6.9* 9.3* 8.6*  8.7*  HCT 28.6* 27.8*  < > 22.7* 21.9* 28.9* 26.8* 27.4*  MCV 85.0 86.9  < > 86.3 86.9 87.6 87.9 87.8  PLT 215 173  < > 157 146* 163 145* 161  < > = values in this interval not displayed. Cardiac Enzymes:  Recent Labs Lab 06/20/15 0515  TROPONINI <0.03   BNP: Invalid input(s): POCBNP CBG: No results for input(s): GLUCAP in the last 168 hours.  Recent Results (from the past 240 hour(s))  Culture, blood (x 2)     Status: None   Collection Time: 06/16/15  4:25 PM  Result Value Ref Range Status   Specimen Description BLOOD RIGHT ARM  Final   Special Requests BOTTLES DRAWN AEROBIC AND ANAEROBIC  5CC, 4CC  Final   Culture  Setup Time   Final    GRAM NEGATIVE RODS IN BOTH AEROBIC AND ANAEROBIC BOTTLES CRITICAL RESULT CALLED TO, READ BACK BY AND VERIFIED WITH: Windell Norfolk AT [phone] ON [id number] BY Rhea Bleacher    Culture   Final    KLEBSIELLA OXYTOCA ENTEROBACTER CLOACAE Performed at [facility]    Report Status 06/19/2015 FINAL  Final   Organism ID, Bacteria KLEBSIELLA OXYTOCA  Final   Organism ID, Bacteria ENTEROBACTER CLOACAE  Final      Susceptibility   Enterobacter cloacae - MIC*    CEFAZOLIN >=64 RESISTANT Resistant     CEFEPIME <=1 SENSITIVE Sensitive     CEFTAZIDIME <=1 SENSITIVE Sensitive     CEFTRIAXONE <=1 SENSITIVE Sensitive     CIPROFLOXACIN <=0.25 SENSITIVE Sensitive     GENTAMICIN <=1 SENSITIVE Sensitive     IMIPENEM 0.5 SENSITIVE Sensitive     TRIMETH/SULFA >=320 RESISTANT Resistant     PIP/TAZO <=4 SENSITIVE Sensitive     * ENTEROBACTER CLOACAE   Klebsiella oxytoca - MIC*    AMPICILLIN >=32 RESISTANT Resistant     CEFAZOLIN >=64 RESISTANT Resistant     CEFEPIME <=1 SENSITIVE Sensitive     CEFTAZIDIME <=1 SENSITIVE Sensitive     CEFTRIAXONE <=1 SENSITIVE Sensitive     CIPROFLOXACIN <=0.25 SENSITIVE Sensitive     GENTAMICIN <=1 SENSITIVE Sensitive     IMIPENEM <=0.25 SENSITIVE Sensitive     TRIMETH/SULFA <=20 SENSITIVE Sensitive      AMPICILLIN/SULBACTAM 4 SENSITIVE Sensitive     PIP/TAZO <=4 SENSITIVE Sensitive     * KLEBSIELLA OXYTOCA  Culture, blood (x 2)     Status: None   Collection Time: 06/16/15  4:40 PM  Result Value Ref Range Status  Specimen Description BLOOD LEFT ARM  Final   Special Requests IN PEDIATRIC BOTTLE 0 .5CC  Final   Culture   Final    NO GROWTH 5 DAYS Performed at [facility]    Report Status 06/21/2015 FINAL  Final  Wound culture     Status: None   Collection Time: 06/16/15  5:40 PM  Result Value Ref Range Status   Specimen Description SACRAL  Final   Special Requests NONE  Final   Gram Stain   Final    NO WBC SEEN NO SQUAMOUS EPITHELIAL CELLS SEEN RARE GRAM POSITIVE COCCI IN PAIRS IN CLUSTERS Performed at [facility]    Culture   Final    FEW STAPHYLOCOCCUS AUREUS Note: RIFAMPIN AND GENTAMICIN SHOULD NOT BE USED AS SINGLE DRUGS FOR TREATMENT OF STAPH INFECTIONS. Performed at [facility]    Report Status 06/19/2015 FINAL  Final   Organism ID, Bacteria STAPHYLOCOCCUS AUREUS  Final      Susceptibility   Staphylococcus aureus - MIC*    CLINDAMYCIN <=0.25 SENSITIVE Sensitive     ERYTHROMYCIN <=0.25 SENSITIVE Sensitive     GENTAMICIN <=0.5 SENSITIVE Sensitive     LEVOFLOXACIN <=0.12 SENSITIVE Sensitive     OXACILLIN 0.5 SENSITIVE Sensitive     RIFAMPIN <=0.5 SENSITIVE Sensitive     TRIMETH/SULFA <=10 SENSITIVE Sensitive     VANCOMYCIN <=0.5 SENSITIVE Sensitive     TETRACYCLINE >=16 RESISTANT Resistant     MOXIFLOXACIN <=0.25 SENSITIVE Sensitive     * FEW STAPHYLOCOCCUS AUREUS  Wound culture     Status: None   Collection Time: 06/16/15  5:42 PM  Result Value Ref Range Status   Specimen Description FOOT RIGHT  Final   Special Requests NONE  Final   Gram Stain   Final    FEW WBC PRESENT,BOTH PMN AND MONONUCLEAR FEW SQUAMOUS EPITHELIAL CELLS PRESENT MODERATE GRAM NEGATIVE RODS FEW GRAM POSITIVE COCCI IN PAIRS Performed at [facility]     Culture   Final    ABUNDANT STAPHYLOCOCCUS AUREUS Note: RIFAMPIN AND GENTAMICIN SHOULD NOT BE USED AS SINGLE DRUGS FOR TREATMENT OF STAPH INFECTIONS. Performed at [facility]    Report Status 06/21/2015 FINAL  Final   Organism ID, Bacteria STAPHYLOCOCCUS AUREUS  Final      Susceptibility   Staphylococcus aureus - MIC*    CLINDAMYCIN <=0.25 SENSITIVE Sensitive     ERYTHROMYCIN <=0.25 SENSITIVE Sensitive     GENTAMICIN <=0.5 SENSITIVE Sensitive     LEVOFLOXACIN 0.25 SENSITIVE Sensitive     OXACILLIN 0.5 SENSITIVE Sensitive     RIFAMPIN <=0.5 SENSITIVE Sensitive     TRIMETH/SULFA <=10 SENSITIVE Sensitive     VANCOMYCIN <=0.5 SENSITIVE Sensitive     TETRACYCLINE >=16 RESISTANT Resistant     MOXIFLOXACIN <=0.25 SENSITIVE Sensitive     * ABUNDANT STAPHYLOCOCCUS AUREUS  MRSA PCR Screening     Status: None   Collection Time: 06/17/15 10:10 AM  Result Value Ref Range Status   MRSA by PCR NEGATIVE NEGATIVE Final    Comment:        The GeneXpert MRSA Assay (FDA approved for NASAL specimens only), is one component of a comprehensive MRSA colonization surveillance program. It is not intended to diagnose MRSA infection nor to guide or monitor treatment for MRSA infections.   Culture, Urine     Status: None   Collection Time: 06/17/15 11:53 AM  Result Value Ref Range Status   Specimen Description URINE, RANDOM  Final  Special Requests NONE  Final   Culture   Final    >=100,000 COLONIES/mL GROUP B STREP(S.AGALACTIAE)ISOLATED TESTING AGAINST S. AGALACTIAE NOT ROUTINELY PERFORMED DUE TO PREDICTABILITY OF AMP/PEN/VAN SUSCEPTIBILITY. Performed at [facility]    Report Status 06/19/2015 FINAL  Final  Culture, routine-abscess     Status: None   Collection Time: 06/17/15  1:16 PM  Result Value Ref Range Status   Specimen Description ABSCESS RIGHT PARASPINAL LUMBAR UNDER CT  Final   Special Requests Normal  Final   Gram Stain   Final    ABUNDANT WBC PRESENT,  PREDOMINANTLY PMN NO SQUAMOUS EPITHELIAL CELLS SEEN NO ORGANISMS SEEN Performed at [facility]    Culture   Final    MULTIPLE ORGANISMS PRESENT, NONE PREDOMINANT Note: NO STAPHYLOCOCCUS AUREUS ISOLATED NO GROUP A STREP (S.PYOGENES) ISOLATED Performed at [facility]    Report Status 06/20/2015 FINAL  Final  C difficile quick scan w PCR reflex     Status: None   Collection Time: 06/19/15  7:47 PM  Result Value Ref Range Status   C Diff antigen NEGATIVE NEGATIVE Final   C Diff toxin NEGATIVE NEGATIVE Final   C Diff interpretation Negative for toxigenic C. difficile  Final     Scheduled Meds: . collagenase  1 application Topical Daily  . diphenhydrAMINE  50 mg Oral Once  . feeding supplement (PRO-STAT SUGAR FREE 64)  30 mL Oral BID  . gabapentin  100 mg Oral QHS  . imipenem-cilastatin  250 mg Intravenous 3 times per [name]  . loperamide  2 mg Oral Daily  . metoprolol tartrate  12.5 mg Oral BID  . pantoprazole  40 mg Oral Daily  . sodium bicarbonate  650 mg Oral BID  . sodium chloride  10-40 mL Intracatheter Q12H  . sodium chloride  3 mL Intravenous Q12H   Continuous Infusions: . sodium chloride 10 mL/hr (06/19/15 0541)  . heparin 750 Units/hr (06/19/15 0900)

## 2015-06-21 NOTE — Progress Notes (Signed)
Had a long discussion with pt's daughter.  Pt has assist with all toileting, bathing, dressing and mobility needs.  Pt is independent feeding self and remains independent.  Pt assists with cleaning dentures and currently can do that.  Feel pt is at baseline with her adls/self care status since her family has done so much for her for sometime now. Pt has been nonambulatory for sometime now and gets assist with transfers to w/c. Will sign off of acute OT at this time. Jinger Neighbors, [place] [phone]

## 2015-06-21 NOTE — Care Management Note (Signed)
Case Management Note  Patient Details  Name: [name] MRN: [id number] Date of Birth: 12-Sep-1933  Subjective/Objective:  Noted per ID-iv abx 2-4 weeks. If long term iv abx needed can arrange if ordered. [facility] confirmed active w/HHRN-wound care/HHPT. Spoke to dtr-patient already has hospital bed, noted she may need air mattress @ home-dtr in agreement.Will need orders.[facility] iv therapy will follow along.                 Action/Plan:d/c plan home.   Expected Discharge Date:   (unknown)               Expected Discharge Plan:  [place]  In-House Referral:  NA  Discharge planning Services  CM Consult  Post Acute Care Choice:  Home Health, Durable Medical Equipment (Active w/[facility]-HHRN/HHPT;[facility] dme-hospital bed.) Choice offered to:     DME Arranged:    DME Agency:     HH Arranged:    HH Agency:     Status of Service:  In process, will continue to follow  Medicare Important Message Given:  Yes Date Medicare IM Given:    Medicare IM give by:    Date Additional Medicare IM Given:    Additional Medicare Important Message give by:     If discussed at [place] of Stay Meetings, dates discussed:    Additional Comments:  Dessa Phi, RN 06/21/2015, 2:39 PM

## 2015-06-22 DIAGNOSIS — I950 Idiopathic hypotension: Secondary | ICD-10-CM

## 2015-06-22 DIAGNOSIS — R600 Localized edema: Secondary | ICD-10-CM

## 2015-06-22 LAB — BASIC METABOLIC PANEL
ANION GAP: 9 (ref 5–15)
BUN: 42 mg/dL — AB (ref 6–20)
CHLORIDE: 112 mmol/L — AB (ref 101–111)
CO2: 20 mmol/L — ABNORMAL LOW (ref 22–32)
Calcium: 7.6 mg/dL — ABNORMAL LOW (ref 8.9–10.3)
Creatinine, Ser: 1.7 mg/dL — ABNORMAL HIGH (ref 0.44–1.00)
GFR, EST AFRICAN AMERICAN: 32 mL/min — AB (ref >60)
GFR, EST NON AFRICAN AMERICAN: 27 mL/min — AB (ref >60)
Glucose, Bld: 67 mg/dL (ref 65–99)
POTASSIUM: 3.5 mmol/L (ref 3.5–5.1)
SODIUM: 141 mmol/L (ref 135–145)

## 2015-06-22 LAB — CBC
HCT: 27.5 % — ABNORMAL LOW (ref 36.0–46.0)
HEMOGLOBIN: 8.6 g/dL — AB (ref 12.0–15.0)
MCH: 27.8 pg (ref 26.0–34.0)
MCHC: 31.3 g/dL (ref 30.0–36.0)
MCV: 89 fL (ref 78.0–100.0)
Platelets: 161 10*3/uL (ref 150–400)
RBC: 3.09 MIL/uL — AB (ref 3.87–5.11)
RDW: 15.3 % (ref 11.5–15.5)
WBC: 5.6 10*3/uL (ref 4.0–10.5)

## 2015-06-22 LAB — HEPARIN LEVEL (UNFRACTIONATED): HEPARIN UNFRACTIONATED: 0.32 [IU]/mL (ref 0.30–0.70)

## 2015-06-22 LAB — OCCULT BLOOD X 1 CARD TO LAB, STOOL
Fecal Occult Bld: NEGATIVE
Fecal Occult Bld: NEGATIVE

## 2015-06-22 MED ORDER — LOPERAMIDE HCL 2 MG PO CAPS
2.0000 mg | ORAL_CAPSULE | Freq: Once | ORAL | Status: AC
  Administered 2015-06-22: 2 mg via ORAL
  Filled 2015-06-22: qty 1

## 2015-06-22 NOTE — Progress Notes (Signed)
Nutrition Follow-up  DOCUMENTATION CODES:   Non-severe (moderate) malnutrition in context of chronic illness  INTERVENTION:  - Continue Prostat BID - RD will continue to monitor for needs  NUTRITION DIAGNOSIS:   Increased nutrient needs related to wound healing as evidenced by estimated needs. -ongoing  GOAL:   Patient will meet greater than or equal to 90% of their needs -variably met  MONITOR:   PO intake, Supplement acceptance, Labs, Weight trends, Skin, I & O's  ASSESSMENT:   82 y.o . patient With h/o htn, cad s/o stent in 2007, h/o chronic systolic chf, not a cnadidate for ace/arb due to ckd IV, she has been taken off blood pressure meds in 03/2015 due to progressive weakness and low bp, h/o stage IV NSCLC initially diagnosed in 2007, with disease recurrent in 11/2014, s/p XRT to right paraspinous mass with osseous invasion at l5 , finished on 12/23/2014, she was then treated with systemic chemo with [place] and paclitaxel which was stopped after two cycles due to intolerance, and mild disease progression, she is currently uncer immunotherapy with PD1 inhibitor, last on 12/23. Today she is sent from oncology clinic Due to hypothermia and hypotension, reported has been having diarrhea for the last two weeks, denies ab pain, no n/v, she has a chronic sacral decubitus ulcer and chronic right lower leg wound which are care by home health RN.   1/19 Per chart review, pt ate 25% of breakfast this AM, 75% of breakfast and 20% of lunch yesterday (1/18), and 100% of breakfast and 25% of both lunch and dinner 1/17. Pt reports that her appetite is improving and that she is eating better. For breakfast this AM she had 2 hardboiled eggs and a slice of [place] toast. Pt states some abdominal pain after meals yesterday but otherwise no abdominal discomfort or nausea with PO intakes.  Variably meeting needs. She denies questions or concerns at this time. Medications reviewed. Labs reviewed; Cl: 112  mmol/L, BUN/creatinine elevated, Ca: 7.6 mg/dL, GFR: 27.   1/15 - Pt in room with no family present. - Per pt, she has been eating better than previous admission in October. - Pt ate all of her omelette this morning. - Pt does have multiple wounds and is still receiving cancer treatments which increases her needs. - Pt has gained back some weight over the last couple of months (unsure if this is d/t her improved PO or fluid gain). - Pt reports not tolerating Ensure supplements which she does like but it causes loose stools.  - She is willing to try Prostat supplements. - Nutrition-Focused physical exam completed.  - Findings are mild fat depletion, moderate muscle depletion, and moderate-severe edema. - Edema may be masking some depletion in extremities.  Diet Order:  Diet regular Room service appropriate?: Yes; Fluid consistency:: Thin  Skin:  Wound (see comment) (Stg IV sacral ulcer, Stg III rt hip ulcer, deep tissue injury on ankle)  Last BM:  1/19  Height:   Ht Readings from Last 1 Encounters:  06/16/15 '5\' 8"'$  (1.727 m)    Weight:   Wt Readings from Last 1 Encounters:  06/16/15 132 lb 4.4 oz (60 kg)    Ideal Body Weight:  63.6 kg  BMI:  Body mass index is 20.12 kg/(m^2).  Estimated Nutritional Needs:   Kcal:  1800-2000  Protein:  85-95g  Fluid:  2L/day  EDUCATION NEEDS:   Education needs addressed     Jarome Matin, RD, LDN Inpatient Clinical Dietitian Pager # [phone]  After hours/weekend pager # [phone]  

## 2015-06-22 NOTE — Progress Notes (Signed)
ANTICOAGULATION CONSULT NOTE - F/u Consult  Pharmacy Consult for heparin Indication: DVT  Allergies  Allergen Reactions  . Amlodipine Swelling  . Ciprofloxacin Hives and Other (See Comments)    REACTION: weakness  . Mirtazapine Other (See Comments)    Hallucinations and nightmares, verbally aggressive   . Statins Other (See Comments)  . Aspirin Swelling    Mouth swelling, tongue  . Cephalexin Hives  . Hydralazine Nausea And Vomiting  . Iron Nausea And Vomiting  . Lorazepam Other (See Comments)    Hallucinations, verbally aggressive  . Sulfonamide Derivatives Hives  . Penicillins Other (See Comments)    Has patient had a PCN reaction causing immediate rash, facial/tongue/throat swelling, SOB or lightheadedness with hypotension: Yes Has patient had a PCN reaction causing severe rash involving mucus membranes or skin necrosis: Yes Has patient had a PCN reaction that required hospitalization No Has patient had a PCN reaction occurring within the last 10 years: No If all of the above answers are "NO", then may proceed with Cephalosporin use.   . Shellfish Allergy Hives and Rash  . Tape Rash    Patient Measurements: Height: '5\' 8"'$  (172.7 cm) Weight: 132 lb 4.4 oz (60 kg) IBW/kg (Calculated) : 63.9 Heparin Dosing Weight: using actual body weight of 60 kg  Vital Signs: Temp: 98.1 F (36.7 C) (01/19 0341) Temp Source: Oral (01/18 2123) BP: 116/50 mmHg (01/19 0341) Pulse Rate: 79 (01/19 0341)  Labs:  Recent Labs  06/20/15 0515  06/20/15 2315 06/21/15 0537 06/22/15 0445  HGB 6.9*  < > 8.6* 8.7* 8.6*  HCT 21.9*  < > 26.8* 27.4* 27.5*  PLT 146*  < > 145* 161 161  HEPARINUNFRC 0.42  --   --  0.33 0.32  CREATININE 1.79*  --   --  1.89* 1.70*  TROPONINI <0.03  --   --   --   --   < > = values in this interval not displayed.  Estimated Creatinine Clearance: 25.4 mL/min (by C-G formula based on Cr of 1.7).   Medical History: Past Medical History  Diagnosis Date  .  Hypertension   . Hypercholesterolemia   . CKD (chronic kidney disease)   . Coronary artery disease     a.  LHC (06/04/05): LHC done in [place] with high grade RCA => s/p BMS to RCA;  b.  Nuclear (09/14/09): Lexiscan; Inf infarct with mild peri-infarct ishemia, EF 52%; Low Risk.  Marland Kitchen GERD (gastroesophageal reflux disease)   . Chronic anemia   . Ischemic cardiomyopathy     a. Echo (07/26/13): Mild LVH, EF 35-40%, diff HK, inf AK, Gr 2 DD, Tr AI, mildly dilated Ao root, MAC, mild MR, mild LAE, mod reduced RVSF.  . Non-small cell carcinoma of lung ([place])     Stage IV  . Chronic fatigue 04/04/2015  . Chronic fatigue 04/04/2015    Medications:  Scheduled:  . collagenase  1 application Topical Daily  . diphenhydrAMINE  50 mg Oral Once  . feeding supplement (PRO-STAT SUGAR FREE 64)  30 mL Oral BID  . gabapentin  100 mg Oral QHS  . imipenem-cilastatin  250 mg Intravenous 3 times per day  . loperamide  2 mg Oral Daily  . metoprolol tartrate  12.5 mg Oral BID  . pantoprazole  40 mg Oral Daily  . sodium bicarbonate  650 mg Oral BID  . sodium chloride  10-40 mL Intracatheter Q12H  . sodium chloride  3 mL Intravenous Q12H    Assessment: Pt  is a 82 yo female diagnosed with DVT noted in the right common femoral vein, right femoral vein, right profunda femoral vein, right popliteal vein, and right proximal peroneal veins. Pt was previously on enoxaparin PTA but has not been on medication for greater than 30 days. Pharmacy consulted to being heparin infusion 1/14 PM.  D/t concern of dropping Hgb levels, omitted heparin bolus & use lower threshold for anticoagulation level (anti-Xa 0.3 to 0.5).   Significant Events: 1/14 am paraspinal fluid aspiration by IR 1/14 pm heparin infusion initiated 1/17: pt received PRBC   Today, 06/22/2015:  CBC: Hgb 8.6, Plt stable 161k   Heparin level therapeutic on current rate of 750 units/hr   No bleeding or infusion issues  Goal of Therapy: Heparin  level 0.3-0.5 units/ml Monitor platelets by anticoagulation protocol: Yes  Plan:  Continue heparin IV infusion at 750 units/hr  Daily CBC, daily heparin level   Monitor for signs of bleeding or thrombosis  Awaiting plan for long term anticoagulation   Dolly Rias RPh 06/22/2015, 7:28 AM Pager [phone]

## 2015-06-22 NOTE — Progress Notes (Signed)
Physical Therapy Discharge Patient Details Name: [name] MRN: [id number] DOB: 1934-03-25 Today's Date: 06/22/2015 Time:  -     Patient discharged from PT services secondary to :Chart reviewed, noted plans for DC to home with continued [facility] services. Patient total care PTA. DME has been recommended. No further acute PT needs. .  Please see latest therapy progress note for current level of functioning and progress toward goals.     GP     Marcelino Freestone PT [phone]  06/22/2015, 7:17 AM

## 2015-06-22 NOTE — Progress Notes (Signed)
Patient ID: [name], female   DOB: 06-May-1934, 82 y.o.   MRN: [id number]  [facility] PROGRESS NOTE  [name]:[id number] DOB: [birth year] DOA: 06/16/2015 PCP: Wende Neighbors, MD   Brief narrative:    Pt is 82 yo female with HT, CAD s/p stent in 2007, h/o chronic systolic chf, not a cnadidate for ace/arb due to ckd IV, she has been taken off blood pressure meds in 03/2015 due to progressive weakness and low bp, h/o stage IV NSCLC initially diagnosed in 2007, with disease recurrent in 11/2014, s/p XRT to right paraspinous mass with osseous invasion at L5 , finished on 12/23/2014, she was then treated with systemic chemo with [place] and paclitaxel which was stopped after two cycles due to intolerance, and mild disease progression, she is currently under immunotherapy with PD1 inhibitor, last on 12/23. She was sent from oncology clinic for evaluation of hypothermia and hypotension, diarrhea for the last two weeks.  Assessment/Plan:    Hypothermia/hypotension/sepsis - present on admission 1/13 with multiple possible source including sacral wound, right hip wound, paraspinal abscess, UTI - polymicrobial paraspinal abscess, s/p aspiration, wound colonized with MSSA, per ID recommend to treat for 2-4 weeks - GBS UTI, GBS ruled out . Sepsis order set placed, culture obtained, empirically start on vanc/aztreonam, IV vanc d/c 1/16,  - ABX changed to Primaxin per ID  - appreciate assistance of ID team  - per ID, portacath may also be seeded with klebsiella and enterobacter, though only 1 of 2 sets +. - Abtx recommendation is to treat with 2-4 wk of abtx, and repeat blood cx off of abtx to see if need to pull portacath.  - pt tolerating well so far, continue to follow up recommendations of ID team   Paraspinal fluids collection/abscess - CT guided aspiration of the paraspinal fluids on 1/14, ABX as noted above per ID team - stable for now   Sacral wound: chronic  - per daughter,  patient was recently released from the wound care center, has been getting wound care by home health RN.  - per surgery, Dr. Hassell Done on 1/15, continue conservative management for now, if patient become septic again, may consider formal general surgery consult.  Right leg wound - right leg xray with extensive soft tissue swelling, edema vs infection, marked osteropenia? Osseous metastases - continue with wound care  - Lower extremity venous US + acute DVT.  Diarrhea - imodium as needed   Bilateral pleural effusion - echocardiogram lvef 50-55% with grade I diastolic dysfunction - monitor volume status   CKD III/IV with history of right sided hydronephrosis - s/p percutaneous nephrostomy tube,in 11/2014 which later removed  - Cr stable  - BMP in AM  Bilateral pedal edema - right lower extremity edema, H/o bilateral lower extremity DVT: was on lovenox , per daughter lovenox discontinued  Acute DVT - right common femoral vein, right femoral vein, right profunda femoral vein, right popliteal vein, and right proximal peroneal veins. - heparin drip vs ivc filter? Patient was on lovenox for previous DVt, but was taken off lovenox two months ago for unclear reason. - 1/14 started heparin drip for now, monitor hgb and sign.  - Primary hematology/oncologist Dr Julien Nordmann consulted, he recommended lovenox  Stage IV lung cancer - Dr Julien Nordmann consulted.  Anemia, likely anemia of chronic disease - Hg stable in the past 24 hours  - CBC in AM  CAD s/p stent in 2007 - h/o chronic systolic chf: echo pending  ,  H/o HTN, essential  - has been taken off bp meds since 03/2015 due to progressive weakness and low bp - started low dose lopressor on 1/15  Report new onset of afib  - on night of 1/16 to 1/17 - on heparin drip, continue betablocker, keep k>4, mag >2. Echo result as mentioned above.  Moderate to Severe malnutrition - bedbound status, very low amount of subc fat on CT scan, recent weight  loss, nutrition consulted   DVT prophylaxis - on heparin drip   Code Status: Full.  Family Communication:  plan of care discussed with the patient Disposition Plan: Home when infection resolved and cleared by ID  IV access:  Peripheral IV  Procedures and diagnostic studies:    Ct Abdomen Pelvis Chest Wo Contrast 06/14/2015  New small to moderate bilateral effusions. Associated collapse/consolidation in the right lower lobe, possibly due to pneumonia. Underlying disease recurrence cannot be excluded. 2. Spiculated left upper lobe nodule is stable to minimally decreased in size. Additional scattered pulmonary nodules in the right lung are better seen on the prior exam. 3. Stable osseous metastatic disease. 4. Thick walled collection of fluid and air in the paraspinous musculature overlying the right aspect of the lumbar spine, largely new from 03/29/2015. Finding may be due to an abscess. Please correlate clinically. 5. Heterogeneous low-attenuation lesion in the upper pole right kidney, stable but indeterminate. 6. Ascending aortic aneurysm. Small suprarenal abdominal aortic aneurysm.   Ct Aspiration 06/17/2015 Technically successful CT-guided aspiration of right paraspinal collection. E  Dg Chest Port 1 View 06/16/2015  No significant change when compared to the recent chest CT. 2. Bilateral pleural effusions with associated lung base atelectasis. No convincing pneumonia or edema. 1 cm left upper lobe spiculated nodule.   Dg Foot 2 Views Right 06/16/2015 Moderate osteopenia with diffuse heterogeneity of the marrow. This likely represents osseous metastases. Similar appearance could be seen with severe osteopenia. 2. Extensive soft tissue swelling. This may represent edema or infection. There is no underlying bone destruction.   Medical Consultants:  oncology Dr Benay Spice ( over the phone on 1/13) , Dr Alen Blew on 1/14, primary oncologist Dr. Julien Nordmann notified on Monday 1/16. IR for paraspinous  fluids collection drain ( talked To Dr. Kathlene Cote) general surgery Dr Hassell Done on 1/15, please see detail above. Infectious disease   Faye Ramsay, MD  [facility] Pager [phone]  If 7PM-7AM, please contact night-coverage [email or web address] Password [email or web address] 06/22/2015, 5:50 PM   LOS: 6 days   HPI/Subjective: No events overnight.   Objective: Filed Vitals:   06/21/15 1400 06/21/15 2123 06/22/15 0341 06/22/15 1435  BP: 120/58 113/65 116/50 116/54  Pulse: 79 83 79 83  Temp: 97.3 F (36.3 C) 97.5 F (36.4 C) 98.1 F (36.7 C) 97.2 F (36.2 C)  TempSrc: Oral Oral  Oral  Resp: '18 16 16 16  '$ Height:      Weight:      SpO2: 99% 100% 100% 100%    Intake/Output Summary (Last 24 hours) at 06/22/15 1750 Last data filed at 06/22/15 1256  Gross per 24 hour  Intake  562.5 ml  Output    725 ml  Net -162.5 ml    Exam:   General:  Pt is alert, follows commands appropriately, not in acute distress  Cardiovascular: Regular rate and rhythm, S1/S2, no murmurs, no rubs, no gallops  Respiratory: Clear to auscultation bilaterally, no wheezing, no crackles, no rhonchi  Data Reviewed: Basic Metabolic Panel:  Recent Labs Lab 06/18/15 0430 06/19/15 0555 06/20/15  0515 06/21/15 0537 06/22/15 0445  NA 136 139 138 141 141  K 3.9 3.7 3.2* 3.5 3.5  CL 107 113* 111 114* 112*  CO2 19* 20* 19* 21* 20*  GLUCOSE 74 82 68 78 67  BUN 31* 35* 38* 41* 42*  CREATININE 1.96* 1.84* 1.79* 1.89* 1.70*  CALCIUM 6.8* 7.0* 6.9* 7.5* 7.6*   Liver Function Tests:  Recent Labs Lab 06/16/15 0904 06/16/15 1710 06/17/15 0315  AST '15 20 16  '$ ALT <9 11* 10*  ALKPHOS 164* 152* 134*  BILITOT 0.38 0.7 0.6  PROT 4.5* 4.3* 3.8*  ALBUMIN 1.5* 1.5* 1.3*   No results for input(s): LIPASE, AMYLASE in the last 168 hours. No results for input(s): AMMONIA in the last 168 hours. CBC:  Recent Labs Lab 06/16/15 0904 06/16/15 1710  06/20/15 0515 06/20/15 1704 06/20/15 2315 06/21/15 0537 06/22/15 0445  WBC 5.1  9.7  < > 4.1 6.6 4.2 5.0 5.6  NEUTROABS 3.5 9.1*  --   --   --   --   --   --   HGB 9.1* 8.7*  < > 6.9* 9.3* 8.6* 8.7* 8.6*  HCT 28.6* 27.8*  < > 21.9* 28.9* 26.8* 27.4* 27.5*  MCV 85.0 86.9  < > 86.9 87.6 87.9 87.8 89.0  PLT 215 173  < > 146* 163 145* 161 161  < > = values in this interval not displayed. Cardiac Enzymes:  Recent Labs Lab 06/20/15 0515  TROPONINI <0.03   BNP: Invalid input(s): POCBNP CBG: No results for input(s): GLUCAP in the last 168 hours.  Recent Results (from the past 240 hour(s))  Culture, blood (x 2)     Status: None   Collection Time: 06/16/15  4:25 PM  Result Value Ref Range Status   Specimen Description BLOOD RIGHT ARM  Final   Special Requests BOTTLES DRAWN AEROBIC AND ANAEROBIC  5CC, 4CC  Final   Culture  Setup Time   Final    GRAM NEGATIVE RODS IN BOTH AEROBIC AND ANAEROBIC BOTTLES CRITICAL RESULT CALLED TO, READ BACK BY AND VERIFIED WITH: Windell Norfolk AT [id number] ON [id number] BY Rhea Bleacher    Culture   Final    KLEBSIELLA OXYTOCA ENTEROBACTER CLOACAE Performed at [facility]    Report Status 06/19/2015 FINAL  Final   Organism ID, Bacteria KLEBSIELLA OXYTOCA  Final   Organism ID, Bacteria ENTEROBACTER CLOACAE  Final      Susceptibility   Enterobacter cloacae - MIC*    CEFAZOLIN >=64 RESISTANT Resistant     CEFEPIME <=1 SENSITIVE Sensitive     CEFTAZIDIME <=1 SENSITIVE Sensitive     CEFTRIAXONE <=1 SENSITIVE Sensitive     CIPROFLOXACIN <=0.25 SENSITIVE Sensitive     GENTAMICIN <=1 SENSITIVE Sensitive     IMIPENEM 0.5 SENSITIVE Sensitive     TRIMETH/SULFA >=320 RESISTANT Resistant     PIP/TAZO <=4 SENSITIVE Sensitive     * ENTEROBACTER CLOACAE   Klebsiella oxytoca - MIC*    AMPICILLIN >=32 RESISTANT Resistant     CEFAZOLIN >=64 RESISTANT Resistant     CEFEPIME <=1 SENSITIVE Sensitive     CEFTAZIDIME <=1 SENSITIVE Sensitive     CEFTRIAXONE <=1 SENSITIVE Sensitive     CIPROFLOXACIN <=0.25 SENSITIVE Sensitive     GENTAMICIN <=1  SENSITIVE Sensitive     IMIPENEM <=0.25 SENSITIVE Sensitive     TRIMETH/SULFA <=20 SENSITIVE Sensitive     AMPICILLIN/SULBACTAM 4 SENSITIVE Sensitive     PIP/TAZO <=4 SENSITIVE Sensitive     *  KLEBSIELLA OXYTOCA  Culture, blood (x 2)     Status: None   Collection Time: 06/16/15  4:40 PM  Result Value Ref Range Status   Specimen Description BLOOD LEFT ARM  Final   Special Requests IN PEDIATRIC BOTTLE 0 .5CC  Final   Culture   Final    NO GROWTH 5 DAYS Performed at [facility]    Report Status 06/21/2015 FINAL  Final  Wound culture     Status: None   Collection Time: 06/16/15  5:40 PM  Result Value Ref Range Status   Specimen Description SACRAL  Final   Special Requests NONE  Final   Gram Stain   Final    NO WBC SEEN NO SQUAMOUS EPITHELIAL CELLS SEEN RARE GRAM POSITIVE COCCI IN PAIRS IN CLUSTERS Performed at [facility]    Culture   Final    FEW STAPHYLOCOCCUS AUREUS Note: RIFAMPIN AND GENTAMICIN SHOULD NOT BE USED AS SINGLE DRUGS FOR TREATMENT OF STAPH INFECTIONS. Performed at [facility]    Report Status 06/19/2015 FINAL  Final   Organism ID, Bacteria STAPHYLOCOCCUS AUREUS  Final      Susceptibility   Staphylococcus aureus - MIC*    CLINDAMYCIN <=0.25 SENSITIVE Sensitive     ERYTHROMYCIN <=0.25 SENSITIVE Sensitive     GENTAMICIN <=0.5 SENSITIVE Sensitive     LEVOFLOXACIN <=0.12 SENSITIVE Sensitive     OXACILLIN 0.5 SENSITIVE Sensitive     RIFAMPIN <=0.5 SENSITIVE Sensitive     TRIMETH/SULFA <=10 SENSITIVE Sensitive     VANCOMYCIN <=0.5 SENSITIVE Sensitive     TETRACYCLINE >=16 RESISTANT Resistant     MOXIFLOXACIN <=0.25 SENSITIVE Sensitive     * FEW STAPHYLOCOCCUS AUREUS  Wound culture     Status: None   Collection Time: 06/16/15  5:42 PM  Result Value Ref Range Status   Specimen Description FOOT RIGHT  Final   Special Requests NONE  Final   Gram Stain   Final    FEW WBC PRESENT,BOTH PMN AND MONONUCLEAR FEW SQUAMOUS EPITHELIAL CELLS  PRESENT MODERATE GRAM NEGATIVE RODS FEW GRAM POSITIVE COCCI IN PAIRS Performed at [facility]    Culture   Final    ABUNDANT STAPHYLOCOCCUS AUREUS Note: RIFAMPIN AND GENTAMICIN SHOULD NOT BE USED AS SINGLE DRUGS FOR TREATMENT OF STAPH INFECTIONS. Performed at [facility]    Report Status 06/21/2015 FINAL  Final   Organism ID, Bacteria STAPHYLOCOCCUS AUREUS  Final      Susceptibility   Staphylococcus aureus - MIC*    CLINDAMYCIN <=0.25 SENSITIVE Sensitive     ERYTHROMYCIN <=0.25 SENSITIVE Sensitive     GENTAMICIN <=0.5 SENSITIVE Sensitive     LEVOFLOXACIN 0.25 SENSITIVE Sensitive     OXACILLIN 0.5 SENSITIVE Sensitive     RIFAMPIN <=0.5 SENSITIVE Sensitive     TRIMETH/SULFA <=10 SENSITIVE Sensitive     VANCOMYCIN <=0.5 SENSITIVE Sensitive     TETRACYCLINE >=16 RESISTANT Resistant     MOXIFLOXACIN <=0.25 SENSITIVE Sensitive     * ABUNDANT STAPHYLOCOCCUS AUREUS  MRSA PCR Screening     Status: None   Collection Time: 06/17/15 10:10 AM  Result Value Ref Range Status   MRSA by PCR NEGATIVE NEGATIVE Final    Comment:        The GeneXpert MRSA Assay (FDA approved for NASAL specimens only), is one component of a comprehensive MRSA colonization surveillance program. It is not intended to diagnose MRSA infection nor to guide or monitor treatment for MRSA infections.  Culture, Urine     Status: None   Collection Time: 06/17/15 11:53 AM  Result Value Ref Range Status   Specimen Description URINE, RANDOM  Final   Special Requests NONE  Final   Culture   Final    >=100,000 COLONIES/mL GROUP B STREP(S.AGALACTIAE)ISOLATED TESTING AGAINST S. AGALACTIAE NOT ROUTINELY PERFORMED DUE TO PREDICTABILITY OF AMP/PEN/VAN SUSCEPTIBILITY. Performed at [facility]    Report Status 06/19/2015 FINAL  Final  Culture, routine-abscess     Status: None   Collection Time: 06/17/15  1:16 PM  Result Value Ref Range Status   Specimen Description ABSCESS RIGHT PARASPINAL LUMBAR  UNDER CT  Final   Special Requests Normal  Final   Gram Stain   Final    ABUNDANT WBC PRESENT, PREDOMINANTLY PMN NO SQUAMOUS EPITHELIAL CELLS SEEN NO ORGANISMS SEEN Performed at [facility]    Culture   Final    MULTIPLE ORGANISMS PRESENT, NONE PREDOMINANT Note: NO STAPHYLOCOCCUS AUREUS ISOLATED NO GROUP A STREP (S.PYOGENES) ISOLATED Performed at [facility]    Report Status 06/20/2015 FINAL  Final  C difficile quick scan w PCR reflex     Status: None   Collection Time: 06/19/15  7:47 PM  Result Value Ref Range Status   C Diff antigen NEGATIVE NEGATIVE Final   C Diff toxin NEGATIVE NEGATIVE Final   C Diff interpretation Negative for toxigenic C. difficile  Final     Scheduled Meds: . collagenase  1 application Topical Daily  . diphenhydrAMINE  50 mg Oral Once  . feeding supplement (PRO-STAT SUGAR FREE 64)  30 mL Oral BID  . gabapentin  100 mg Oral QHS  . imipenem-cilastatin  250 mg Intravenous 3 times per day  . loperamide  2 mg Oral Daily  . loperamide  2 mg Oral Once  . metoprolol tartrate  12.5 mg Oral BID  . pantoprazole  40 mg Oral Daily  . sodium bicarbonate  650 mg Oral BID  . sodium chloride  10-40 mL Intracatheter Q12H  . sodium chloride  3 mL Intravenous Q12H   Continuous Infusions: . sodium chloride 10 mL/hr (06/19/15 0541)  . heparin 750 Units/hr (06/21/15 1938)

## 2015-06-22 NOTE — Progress Notes (Signed)
[facility] for Infectious Disease    Date of Admission:  06/16/2015   Total days of antibiotics 7        Day 4 imipenem           ID: [name] is a 82 y.o. female with  HTN, CKD, CAD, GERD, and stage IV NSCLC involving lumbar spine, debilitated who has sacral, right hip, and left heel pressure ulcers presents with sepsis (hypotension, hypothermia) found to have polymicrobial bacteremia in setting of 2 wk fo watery diarrhea as well as polymicrobial paraspinal abscess s/p aspiration. Wound as colonized with MSSA. +/- GBS uti. She has portacath but only had 1 of 2 blood cx with enterobacter/klebsiella. Active Problems:   Pressure ulcer   Non-small cell lung cancer (HCC)   Paraspinal mass   Metastatic squamous cell carcinoma to bone (HCC)   Hypotension   Severe protein-calorie malnutrition (HCC)   Hypothermia   Paraspinal abscess (HCC)   Malnutrition of moderate degree   Abscess   Edema   Sepsis (HCC)    Subjective: Feeling better, only had 3 BM throughout the day  Medications:  . collagenase  1 application Topical Daily  . diphenhydrAMINE  50 mg Oral Once  . feeding supplement (PRO-STAT SUGAR FREE 64)  30 mL Oral BID  . gabapentin  100 mg Oral QHS  . imipenem-cilastatin  250 mg Intravenous 3 times per day  . loperamide  2 mg Oral Daily  . metoprolol tartrate  12.5 mg Oral BID  . pantoprazole  40 mg Oral Daily  . sodium bicarbonate  650 mg Oral BID  . sodium chloride  10-40 mL Intracatheter Q12H  . sodium chloride  3 mL Intravenous Q12H    Objective: Vital signs in last 24 hours: Temp:  [97.2 F (36.2 C)-98.1 F (36.7 C)] 97.2 F (36.2 C) (01/19 1435) Pulse Rate:  [79-83] 83 (01/19 1435) Resp:  [16] 16 (01/19 1435) BP: (113-116)/(50-65) 116/54 mmHg (01/19 1435) SpO2:  [100 %] 100 % (01/19 1435) Constitutional: oriented to person, place, appears frail, age appropriate, thin/emaciate. No distress.  HENT: [place]/AT, PERRLA, no scleral icterus Mouth/Throat:  Oropharynx is clear and moist. No oropharyngeal exudate.  Cardiovascular: Normal rate, regular rhythm and normal heart sounds. Exam reveals no gallop and no friction rub.  No murmur heard.  Pulmonary/Chest: Effort normal and breath sounds normal. No respiratory distress. has no wheezes.  Neck = supple, no nuchal rigidity Abdominal: Soft. Bowel sounds are normal. exhibits no distension. There is no tenderness.  Lymphadenopathy: no cervical adenopathy. No axillary adenopathy Neurological: alert and oriented to person, place, and time.  Skin: numerous echymosis throughout torso and extremities with skin tear to left fore arm, left shoulder  Sacrum = large wound stage 4 with fibrinous debris at base  Ext: left heel pressure ulcer, as described above Psychiatric: a normal mood and affect. behavior is normal.     Lab Results  Recent Labs  06/21/15 0537 06/22/15 0445  WBC 5.0 5.6  HGB 8.7* 8.6*  HCT 27.4* 27.5*  NA 141 141  K 3.5 3.5  CL 114* 112*  CO2 21* 20*  BUN 41* 42*  CREATININE 1.89* 1.70*   Lab Results  Component Value Date   ESRSEDRATE 17 06/16/2015   Lab Results  Component Value Date   CRP 11.0* 06/16/2015    Assessment/Plan: 82yo F with HTN, CKD, CAD, GERD, and stage IV NSCLC involving lumbar spine, debilitated who has sacral, right hip, and left heel  pressure ulcers presents with sepsis (hypotension, hypothermia) found to have polymicrobial bacteremia in setting of 2 wk fo watery diarrhea as well as polymicrobial paraspinal abscess s/p aspiration. Wound as colonized with MSSA. +/- GBS uti. She has portacath but only had 1 of 2 blood cx with GNR. Complains of ongoing diarrhea  Diarrhea = cdiff is ruled out. Recommend to schedule immodium 1-2 x per day to see if it helps her symptoms. Will give one dose tonight to see if she has continued improvement.  Polymicrobial bacteremia with secondary paraspinal abscess = would suspect that she possibly had gram  negative pathogen translocate to bloodstream and causes 2.4 cm paraspinal abscess. Recommend to treat for minimum of 2 wk possibly 3 wk. Can discharge on ertapenem for once a day dosing, currently on day 8 of 21 days   her portacath may also be seeded with klebsiella and enterobacter, though only 1 of 2 sets +.  repeat blood cx off of abtx to see if need to pull portacath.   Decub ulcers stage 4 and stage 3 = would need to ensure she has the proper home health to care for wounds, proper air mattress for home hospital bed to help with improvement possibly. She is colonized with MSSA and would get some benefit from tx bacteremia. Wound care is key especially to keep feculant material out of the wound  NSCLC = defer to oncology for further [facility] for Infectious Diseases Cell: [phone] Pager: [phone]  06/22/2015, 3:33 PM

## 2015-06-22 NOTE — Progress Notes (Signed)
Pharmacy Antibiotic Follow-up Note  [name] is a 82 y.o. year-old female admitted on 06/16/2015.  The patient is currently on primaxin for sepsis d/t paraspinal abscess and multiple chronic wounds.  Assessment/Plan:  Continue Primaxin 250 mg IV q8 hr.  Per ID will need 2-4 weeks of therapy. PCN/cephalosporin allergies noted, but has tolerated both imipenem and ertapenem previously.   Temp (24hrs), Avg:97.6 F (36.4 C), Min:97.3 F (36.3 C), Max:98.1 F (36.7 C)   Recent Labs Lab 06/20/15 0515 06/20/15 1704 06/20/15 2315 06/21/15 0537 06/22/15 0445  WBC 4.1 6.6 4.2 5.0 5.6     Recent Labs Lab 06/18/15 0430 06/19/15 0555 06/20/15 0515 06/21/15 0537 06/22/15 0445  CREATININE 1.96* 1.84* 1.79* 1.89* 1.70*   Estimated Creatinine Clearance: 25.4 mL/min (by C-G formula based on Cr of 1.7).    Allergies  Allergen Reactions  . Amlodipine Swelling  . Ciprofloxacin Hives and Other (See Comments)    REACTION: weakness  . Mirtazapine Other (See Comments)    Hallucinations and nightmares, verbally aggressive   . Statins Other (See Comments)  . Aspirin Swelling    Mouth swelling, tongue  . Cephalexin Hives  . Hydralazine Nausea And Vomiting  . Iron Nausea And Vomiting  . Lorazepam Other (See Comments)    Hallucinations, verbally aggressive  . Sulfonamide Derivatives Hives  . Penicillins Other (See Comments)    Has patient had a PCN reaction causing immediate rash, facial/tongue/throat swelling, SOB or lightheadedness with hypotension: Yes Has patient had a PCN reaction causing severe rash involving mucus membranes or skin necrosis: Yes Has patient had a PCN reaction that required hospitalization No Has patient had a PCN reaction occurring within the last 10 years: No If all of the above answers are "NO", then may proceed with Cephalosporin use.   . Shellfish Allergy Hives and Rash  . Tape Rash    Antimicrobials this admission: 1/13 >> vancomycin >> 1/16 1/13  >> aztreonam >>   1/16 1/13 >> vancomycin PO (empiric) >> 1/16 1/16 >> primaxin >>  Levels/dose changes this admission: none  Microbiology results: 1/13 blood: 1/2 GNR 1/13 wound (sacral wound): few S aureus (R-tetracyc only) 1/13 wound (R foot): staph aureus 1/14 MRSA screen neg 1/14 abscess cx: multiple org present, none predom 1/14 urine: IP Cdiff negative  Today, 06/22/2015: Temp: afebrile; previously low temps WBC: stable wnl Renal: SCr stable; CrCl 25 ml/min CG   Thank you for allowing pharmacy to be a part of this patient's care.  Dolly Rias RPh 06/22/2015, 7:40 AM Pager [phone]

## 2015-06-23 DIAGNOSIS — R29818 Other symptoms and signs involving the nervous system: Secondary | ICD-10-CM

## 2015-06-23 LAB — BASIC METABOLIC PANEL
Anion gap: 6 (ref 5–15)
BUN: 45 mg/dL — AB (ref 6–20)
CHLORIDE: 113 mmol/L — AB (ref 101–111)
CO2: 22 mmol/L (ref 22–32)
CREATININE: 1.67 mg/dL — AB (ref 0.44–1.00)
Calcium: 7.6 mg/dL — ABNORMAL LOW (ref 8.9–10.3)
GFR calc Af Amer: 33 mL/min — ABNORMAL LOW (ref >60)
GFR calc non Af Amer: 28 mL/min — ABNORMAL LOW (ref >60)
GLUCOSE: 74 mg/dL (ref 65–99)
Potassium: 3.5 mmol/L (ref 3.5–5.1)
SODIUM: 141 mmol/L (ref 135–145)

## 2015-06-23 LAB — HEPARIN LEVEL (UNFRACTIONATED)
HEPARIN UNFRACTIONATED: 0.28 [IU]/mL — AB (ref 0.30–0.70)
Heparin Unfractionated: 0.13 IU/mL — ABNORMAL LOW (ref 0.30–0.70)

## 2015-06-23 LAB — CBC
HCT: 26.2 % — ABNORMAL LOW (ref 36.0–46.0)
Hemoglobin: 8.2 g/dL — ABNORMAL LOW (ref 12.0–15.0)
MCH: 27.9 pg (ref 26.0–34.0)
MCHC: 31.3 g/dL (ref 30.0–36.0)
MCV: 89.1 fL (ref 78.0–100.0)
PLATELETS: 155 10*3/uL (ref 150–400)
RBC: 2.94 MIL/uL — ABNORMAL LOW (ref 3.87–5.11)
RDW: 15.3 % (ref 11.5–15.5)
WBC: 5.9 10*3/uL (ref 4.0–10.5)

## 2015-06-23 MED ORDER — LOPERAMIDE HCL 2 MG PO CAPS
2.0000 mg | ORAL_CAPSULE | Freq: Two times a day (BID) | ORAL | Status: DC
  Administered 2015-06-23 – 2015-07-03 (×20): 2 mg via ORAL
  Filled 2015-06-23 (×23): qty 1

## 2015-06-23 MED ORDER — GUAIFENESIN-DM 100-10 MG/5ML PO SYRP: 5.0000 mL | ORAL_SOLUTION | ORAL | Status: DC | PRN

## 2015-06-23 NOTE — Progress Notes (Signed)
Patient ID: [name], female   DOB: [birth year], 82 y.o.   MRN: [id number]  [facility] PROGRESS NOTE  [name] YQM:[id number] DOB: Oct 28, 1933 DOA: 06/16/2015 PCP: Wende Neighbors, MD   Brief narrative:    Pt is 82 yo female with HT, CAD s/p stent in 2007, h/o chronic systolic chf, not a cnadidate for ace/arb due to ckd IV, she has been taken off blood pressure meds in 03/2015 due to progressive weakness and low bp, h/o stage IV NSCLC initially diagnosed in 2007, with disease recurrent in 11/2014, s/p XRT to right paraspinous mass with osseous invasion at L5 , finished on 12/23/2014, she was then treated with systemic chemo with [place] and paclitaxel which was stopped after two cycles due to intolerance, and mild disease progression, she is currently under immunotherapy with PD1 inhibitor, last on 12/23. She was sent from oncology clinic for evaluation of hypothermia and hypotension, diarrhea for the last two weeks.  Assessment/Plan:    Hypothermia/hypotension/sepsis - present on admission 1/13 with multiple possible source including sacral wound, right hip wound, paraspinal abscess, UTI - polymicrobial paraspinal abscess, s/p aspiration, wound colonized with MSSA, per ID recommend to treat for 2-4 weeks - GBS UTI, GBS ruled out . Sepsis order set placed, culture obtained, empirically start on vanc/aztreonam, IV vanc d/c 1/16,  - ABX changed to Primaxin per ID  - appreciate assistance of ID team  - per ID, portacath may also be seeded with klebsiella and enterobacter, though only 1 of 2 sets +. - Abtx recommendation is to treat with 2-4 wk of abtx, and repeat blood cx off of abtx to see if need to pull portacath.  - pt tolerating well so far, continue to follow up recommendations of ID team   Paraspinal fluids collection/abscess - CT guided aspiration of the paraspinal fluids on 1/14, ABX as noted above per ID team - stable for now   Sacral wound: chronic  - per daughter,  patient was recently released from the wound care center, has been getting wound care by home health RN.  - per surgery, Dr. Hassell Done on 1/15, continue conservative management for now, if patient become septic again, may consider formal general surgery consult.  Right leg wound - right leg xray with extensive soft tissue swelling, edema vs infection, marked osteropenia? Osseous metastases - continue with wound care  - Lower extremity venous US + acute DVT - keep extremity elevated   Diarrhea - imodium as needed as it seems like it helps   Bilateral pleural effusion - echocardiogram lvef 50-55% with grade I diastolic dysfunction - volume status stable   CKD III/IV with history of right sided hydronephrosis - s/p percutaneous nephrostomy tube,in 11/2014 which later removed  - Cr stable  - BMP in AM  Bilateral pedal edema - right lower extremity edema, H/o bilateral lower extremity DVT: was on lovenox , per daughter lovenox discontinued  Acute DVT - right common femoral vein, right femoral vein, right profunda femoral vein, right popliteal vein, and right proximal peroneal veins. - heparin drip vs ivc filter? Patient was on lovenox for previous DVt, but was taken off lovenox two months ago for unclear reason. - 1/14 started heparin drip for now, monitor hgb and sign.  - Primary hematology/oncologist Dr Julien Nordmann consulted, he recommended lovenox - resume Lovenox in AM  Stage IV lung cancer - Dr Julien Nordmann consulted, appreciate recommendations, pt will have outpatient follow up  Anemia, likely anemia of chronic disease - Hg stable in  the past 24 hours  - CBC in AM  CAD s/p stent in 2007 - h/o chronic systolic chf: echo pending  , H/o HTN, essential  - has been taken off bp meds since 03/2015 due to progressive weakness and low bp - started low dose lopressor on 1/15  Report new onset of afib  - on night of 1/16 to 1/17 - on heparin drip, continue betablocker, keep k>4, mag >2.  Echo result as mentioned above.  Moderate to Severe malnutrition - bedbound status, very low amount of subc fat on CT scan, recent weight loss, nutrition consulted   DVT prophylaxis - on heparin drip   Code Status: Full.  Family Communication:  plan of care discussed with the patient Disposition Plan: Home when infection resolved and cleared by ID  IV access:  Peripheral IV  Procedures and diagnostic studies:    Ct Abdomen Pelvis Chest Wo Contrast 06/14/2015  New small to moderate bilateral effusions. Associated collapse/consolidation in the right lower lobe, possibly due to pneumonia. Underlying disease recurrence cannot be excluded. 2. Spiculated left upper lobe nodule is stable to minimally decreased in size. Additional scattered pulmonary nodules in the right lung are better seen on the prior exam. 3. Stable osseous metastatic disease. 4. Thick walled collection of fluid and air in the paraspinous musculature overlying the right aspect of the lumbar spine, largely new from 03/29/2015. Finding may be due to an abscess. Please correlate clinically. 5. Heterogeneous low-attenuation lesion in the upper pole right kidney, stable but indeterminate. 6. Ascending aortic aneurysm. Small suprarenal abdominal aortic aneurysm.   Ct Aspiration 06/17/2015 Technically successful CT-guided aspiration of right paraspinal collection. E  Dg Chest Port 1 View 06/16/2015  No significant change when compared to the recent chest CT. 2. Bilateral pleural effusions with associated lung base atelectasis. No convincing pneumonia or edema. 1 cm left upper lobe spiculated nodule.   Dg Foot 2 Views Right 06/16/2015 Moderate osteopenia with diffuse heterogeneity of the marrow. This likely represents osseous metastases. Similar appearance could be seen with severe osteopenia. 2. Extensive soft tissue swelling. This may represent edema or infection. There is no underlying bone destruction.   Medical Consultants:   Oncology Dr Benay Spice ( over the phone on 1/13) , Dr Alen Blew on 1/14, primary oncologist Dr. Julien Nordmann notified on Monday 1/16. IR for paraspinous fluids collection drain ( talked To Dr. Kathlene Cote) General surgery Dr Hassell Done on 1/15, please see detail above. Infectious disease   Faye Ramsay, MD  [facility] Pager [phone]  If 7PM-7AM, please contact night-coverage [email or web address] Password [email or web address] 06/23/2015, 9:35 AM   LOS: 7 days   HPI/Subjective: No events overnight.   Objective: Filed Vitals:   06/22/15 1435 06/22/15 2054 06/23/15 0629 06/23/15 0920  BP: 116/54 126/64 103/55 109/56  Pulse: 83 83 89 83  Temp: 97.2 F (36.2 C) 97.7 F (36.5 C) 98.2 F (36.8 C) 98.1 F (36.7 C)  TempSrc: Oral Oral Oral Oral  Resp: '16 16 18 18  '$ Height:      Weight:      SpO2: 100% 99% 97% 98%    Intake/Output Summary (Last 24 hours) at 06/23/15 0935 Last data filed at 06/23/15 7858  Gross per 24 hour  Intake   1420 ml  Output    900 ml  Net    520 ml    Exam:   General:  Pt is alert, follows commands appropriately, not in acute distress  Cardiovascular: Regular rate and rhythm, S1/S2, no murmurs, no rubs, no  gallops  Respiratory: Clear to auscultation bilaterally, no wheezing, no crackles, no rhonchi  Data Reviewed: Basic Metabolic Panel:  Recent Labs Lab 06/19/15 0555 06/20/15 0515 06/21/15 0537 06/22/15 0445 06/23/15 0450  NA 139 138 141 141 141  K 3.7 3.2* 3.5 3.5 3.5  CL 113* 111 114* 112* 113*  CO2 20* 19* 21* 20* 22  GLUCOSE 82 68 78 67 74  BUN 35* 38* 41* 42* 45*  CREATININE 1.84* 1.79* 1.89* 1.70* 1.67*  CALCIUM 7.0* 6.9* 7.5* 7.6* 7.6*   Liver Function Tests:  Recent Labs Lab 06/16/15 1710 06/17/15 0315  AST 20 16  ALT 11* 10*  ALKPHOS 152* 134*  BILITOT 0.7 0.6  PROT 4.3* 3.8*  ALBUMIN 1.5* 1.3*   CBC:  Recent Labs Lab 06/16/15 1710  06/20/15 1704 06/20/15 2315 06/21/15 0537 06/22/15 0445 06/23/15 0450  WBC 9.7  < > 6.6 4.2 5.0 5.6 5.9   NEUTROABS 9.1*  --   --   --   --   --   --   HGB 8.7*  < > 9.3* 8.6* 8.7* 8.6* 8.2*  HCT 27.8*  < > 28.9* 26.8* 27.4* 27.5* 26.2*  MCV 86.9  < > 87.6 87.9 87.8 89.0 89.1  PLT 173  < > 163 145* 161 161 155  < > = values in this interval not displayed. Cardiac Enzymes:  Recent Labs Lab 06/20/15 0515  TROPONINI <0.03   Recent Results (from the past 240 hour(s))  Culture, blood (x 2)     Status: None   Collection Time: 06/16/15  4:25 PM  Result Value Ref Range Status   Specimen Description BLOOD RIGHT ARM  Final   Special Requests BOTTLES DRAWN AEROBIC AND ANAEROBIC  5CC, 4CC  Final   Culture  Setup Time   Final    GRAM NEGATIVE RODS IN BOTH AEROBIC AND ANAEROBIC BOTTLES CRITICAL RESULT CALLED TO, READ BACK BY AND VERIFIED WITH: Windell Norfolk AT [id number] ON [id number] BY Rhea Bleacher    Culture   Final    KLEBSIELLA OXYTOCA ENTEROBACTER CLOACAE Performed at [facility]    Report Status 06/19/2015 FINAL  Final   Organism ID, Bacteria KLEBSIELLA OXYTOCA  Final   Organism ID, Bacteria ENTEROBACTER CLOACAE  Final      Susceptibility   Enterobacter cloacae - MIC*    CEFAZOLIN >=64 RESISTANT Resistant     CEFEPIME <=1 SENSITIVE Sensitive     CEFTAZIDIME <=1 SENSITIVE Sensitive     CEFTRIAXONE <=1 SENSITIVE Sensitive     CIPROFLOXACIN <=0.25 SENSITIVE Sensitive     GENTAMICIN <=1 SENSITIVE Sensitive     IMIPENEM 0.5 SENSITIVE Sensitive     TRIMETH/SULFA >=320 RESISTANT Resistant     PIP/TAZO <=4 SENSITIVE Sensitive     * ENTEROBACTER CLOACAE   Klebsiella oxytoca - MIC*    AMPICILLIN >=32 RESISTANT Resistant     CEFAZOLIN >=64 RESISTANT Resistant     CEFEPIME <=1 SENSITIVE Sensitive     CEFTAZIDIME <=1 SENSITIVE Sensitive     CEFTRIAXONE <=1 SENSITIVE Sensitive     CIPROFLOXACIN <=0.25 SENSITIVE Sensitive     GENTAMICIN <=1 SENSITIVE Sensitive     IMIPENEM <=0.25 SENSITIVE Sensitive     TRIMETH/SULFA <=20 SENSITIVE Sensitive     AMPICILLIN/SULBACTAM 4 SENSITIVE Sensitive      PIP/TAZO <=4 SENSITIVE Sensitive     * KLEBSIELLA OXYTOCA  Culture, blood (x 2)     Status: None   Collection Time: 06/16/15  4:40 PM  Result Value  Ref Range Status   Specimen Description BLOOD LEFT ARM  Final   Special Requests IN PEDIATRIC BOTTLE 0 .5CC  Final   Culture   Final    NO GROWTH 5 DAYS Performed at [facility]    Report Status 06/21/2015 FINAL  Final  Wound culture     Status: None   Collection Time: 06/16/15  5:40 PM  Result Value Ref Range Status   Specimen Description SACRAL  Final   Special Requests NONE  Final   Gram Stain   Final    NO WBC SEEN NO SQUAMOUS EPITHELIAL CELLS SEEN RARE GRAM POSITIVE COCCI IN PAIRS IN CLUSTERS Performed at [facility]    Culture   Final    FEW STAPHYLOCOCCUS AUREUS Note: RIFAMPIN AND GENTAMICIN SHOULD NOT BE USED AS SINGLE DRUGS FOR TREATMENT OF STAPH INFECTIONS. Performed at [facility]    Report Status 06/19/2015 FINAL  Final   Organism ID, Bacteria STAPHYLOCOCCUS AUREUS  Final      Susceptibility   Staphylococcus aureus - MIC*    CLINDAMYCIN <=0.25 SENSITIVE Sensitive     ERYTHROMYCIN <=0.25 SENSITIVE Sensitive     GENTAMICIN <=0.5 SENSITIVE Sensitive     LEVOFLOXACIN <=0.12 SENSITIVE Sensitive     OXACILLIN 0.5 SENSITIVE Sensitive     RIFAMPIN <=0.5 SENSITIVE Sensitive     TRIMETH/SULFA <=10 SENSITIVE Sensitive     VANCOMYCIN <=0.5 SENSITIVE Sensitive     TETRACYCLINE >=16 RESISTANT Resistant     MOXIFLOXACIN <=0.25 SENSITIVE Sensitive     * FEW STAPHYLOCOCCUS AUREUS  Wound culture     Status: None   Collection Time: 06/16/15  5:42 PM  Result Value Ref Range Status   Specimen Description FOOT RIGHT  Final   Special Requests NONE  Final   Gram Stain   Final    FEW WBC PRESENT,BOTH PMN AND MONONUCLEAR FEW SQUAMOUS EPITHELIAL CELLS PRESENT MODERATE GRAM NEGATIVE RODS FEW GRAM POSITIVE COCCI IN PAIRS Performed at [facility]    Culture   Final    ABUNDANT STAPHYLOCOCCUS  AUREUS Note: RIFAMPIN AND GENTAMICIN SHOULD NOT BE USED AS SINGLE DRUGS FOR TREATMENT OF STAPH INFECTIONS. Performed at [facility]    Report Status 06/21/2015 FINAL  Final   Organism ID, Bacteria STAPHYLOCOCCUS AUREUS  Final      Susceptibility   Staphylococcus aureus - MIC*    CLINDAMYCIN <=0.25 SENSITIVE Sensitive     ERYTHROMYCIN <=0.25 SENSITIVE Sensitive     GENTAMICIN <=0.5 SENSITIVE Sensitive     LEVOFLOXACIN 0.25 SENSITIVE Sensitive     OXACILLIN 0.5 SENSITIVE Sensitive     RIFAMPIN <=0.5 SENSITIVE Sensitive     TRIMETH/SULFA <=10 SENSITIVE Sensitive     VANCOMYCIN <=0.5 SENSITIVE Sensitive     TETRACYCLINE >=16 RESISTANT Resistant     MOXIFLOXACIN <=0.25 SENSITIVE Sensitive     * ABUNDANT STAPHYLOCOCCUS AUREUS  MRSA PCR Screening     Status: None   Collection Time: 06/17/15 10:10 AM  Result Value Ref Range Status   MRSA by PCR NEGATIVE NEGATIVE Final    Comment:        The GeneXpert MRSA Assay (FDA approved for NASAL specimens only), is one component of a comprehensive MRSA colonization surveillance program. It is not intended to diagnose MRSA infection nor to guide or monitor treatment for MRSA infections.   Culture, Urine     Status: None   Collection Time: 06/17/15 11:53 AM  Result Value Ref Range Status   Specimen  Description URINE, RANDOM  Final   Special Requests NONE  Final   Culture   Final    >=100,000 COLONIES/mL GROUP B STREP(S.AGALACTIAE)ISOLATED TESTING AGAINST S. AGALACTIAE NOT ROUTINELY PERFORMED DUE TO PREDICTABILITY OF AMP/PEN/VAN SUSCEPTIBILITY. Performed at [facility]    Report Status 06/19/2015 FINAL  Final  Culture, routine-abscess     Status: None   Collection Time: 06/17/15  1:16 PM  Result Value Ref Range Status   Specimen Description ABSCESS RIGHT PARASPINAL LUMBAR UNDER CT  Final   Special Requests Normal  Final   Gram Stain   Final    ABUNDANT WBC PRESENT, PREDOMINANTLY PMN NO SQUAMOUS EPITHELIAL CELLS SEEN NO  ORGANISMS SEEN Performed at [facility]    Culture   Final    MULTIPLE ORGANISMS PRESENT, NONE PREDOMINANT Note: NO STAPHYLOCOCCUS AUREUS ISOLATED NO GROUP A STREP (S.PYOGENES) ISOLATED Performed at [facility]    Report Status 06/20/2015 FINAL  Final  C difficile quick scan w PCR reflex     Status: None   Collection Time: 06/19/15  7:47 PM  Result Value Ref Range Status   C Diff antigen NEGATIVE NEGATIVE Final   C Diff toxin NEGATIVE NEGATIVE Final   C Diff interpretation Negative for toxigenic C. difficile  Final     Scheduled Meds: . diphenhydrAMINE  50 mg Oral Once  . feeding supplement (PRO-STAT SUGAR FREE 64)  30 mL Oral BID  . gabapentin  100 mg Oral QHS  . imipenem-cilastatin  250 mg Intravenous 3 times per day  . loperamide  2 mg Oral Daily  . metoprolol tartrate  12.5 mg Oral BID  . pantoprazole  40 mg Oral Daily  . sodium bicarbonate  650 mg Oral BID  . sodium chloride  10-40 mL Intracatheter Q12H  . sodium chloride  3 mL Intravenous Q12H   Continuous Infusions: . sodium chloride 10 mL/hr (06/19/15 0541)  . heparin 950 Units/hr (06/23/15 0645)

## 2015-06-23 NOTE — Progress Notes (Signed)
Protocol: Heparin Indication: DVT  Assessment: Pt is currently on Heparin at 950 units/hr. HL =0.28 (goal 0.3-0.5) which is below goal. No bleeding per RN.  Plan: Will increase heparin drip to 1000 units/hr Will check 8 hr HL   Thanks, Garnet Sierras, PharmD 06/23/2015

## 2015-06-23 NOTE — Progress Notes (Signed)
[facility]    Date of Admission:  06/16/2015   Total days of antibiotics 8        Day 5 imipenem           ID: [name] is a 82 y.o. female with  HTN, CKD, CAD, GERD, and stage IV NSCLC involving lumbar spine, debilitated who has sacral, right hip, and left heel pressure ulcers presents with sepsis (hypotension, hypothermia) found to have polymicrobial bacteremia in setting of 2 wk fo watery diarrhea as well as polymicrobial paraspinal abscess s/p aspiration. Wound as colonized with MSSA. +/- GBS uti. She has portacath but only had 1 of 2 blood cx with enterobacter/klebsiella. Also found to have DVT noted in the right common femoral vein, right femoral vein, right profunda femoral vein, right popliteal vein, and right proximal peroneal veins. Active Problems:   Pressure ulcer   Non-small cell lung cancer (HCC)   Paraspinal mass   Metastatic squamous cell carcinoma to bone (HCC)   Hypotension   Severe protein-calorie malnutrition (HCC)   Hypothermia   Paraspinal abscess (HCC)   Malnutrition of moderate degree   Abscess   Edema   Sepsis (HCC)    Subjective: Feeling better, only had 2 BM throughout the day, a total of 5 BM yesterday (after receiving 1 tab immodium bid)  Medications:  . diphenhydrAMINE  50 mg Oral Once  . feeding supplement (PRO-STAT SUGAR FREE 64)  30 mL Oral BID  . gabapentin  100 mg Oral QHS  . imipenem-cilastatin  250 mg Intravenous 3 times per day  . loperamide  2 mg Oral Daily  . metoprolol tartrate  12.5 mg Oral BID  . pantoprazole  40 mg Oral Daily  . sodium bicarbonate  650 mg Oral BID  . sodium chloride  10-40 mL Intracatheter Q12H  . sodium chloride  3 mL Intravenous Q12H    Objective: Vital signs in last 24 hours: Temp:  [97.2 F (36.2 C)-98.2 F (36.8 C)] 98.1 F (36.7 C) (01/20 0920) Pulse Rate:  [83-89] 83 (01/20 0920) Resp:  [16-18] 18 (01/20 0920) BP: (103-126)/(54-64) 109/56 mmHg (01/20 0920) SpO2:   [97 %-100 %] 98 % (01/20 0920) Constitutional: oriented to person, place, appears frail, age appropriate, thin/emaciate. No distress.  HENT: [place]/AT, PERRLA, no scleral icterus Mouth/Throat: Oropharynx is clear and moist. No oropharyngeal exudate.  Cardiovascular: Normal rate, regular rhythm and normal heart sounds. Exam reveals no gallop and no friction rub.  No murmur heard.  Pulmonary/Chest: Effort normal and breath sounds normal. No respiratory distress. has no wheezes.  Neck = supple, no nuchal rigidity Abdominal: Soft. Bowel sounds are normal. exhibits no distension. There is no tenderness.  Lymphadenopathy: no cervical adenopathy. No axillary adenopathy Neurological: alert and oriented to person, place, and time.  Skin: numerous echymosis throughout torso and extremities with skin tear to left fore arm, left shoulder  Sacrum = large wound stage 4 with fibrinous debris at base  Ext: left heel pressure ulcer, as described above. +2 pitting edema due to anasarca. Right leg greater than left (also r leg is + DVT) Psychiatric: a normal mood and affect. behavior is normal.     Lab Results  Recent Labs  06/22/15 0445 06/23/15 0450  WBC 5.6 5.9  HGB 8.6* 8.2*  HCT 27.5* 26.2*  NA 141 141  K 3.5 3.5  CL 112* 113*  CO2 20* 22  BUN 42* 45*  CREATININE 1.70* 1.67*   Lab Results  Component Value Date   ESRSEDRATE 17 06/16/2015   Lab Results  Component Value Date   CRP 11.0* 06/16/2015    Assessment/Plan: 82yo F with HTN, CKD, CAD, GERD, and stage IV NSCLC involving lumbar spine, debilitated who has sacral, right hip, and left heel pressure ulcers presents with sepsis (hypotension, hypothermia) found to have polymicrobial bacteremia in setting of 2 wk fo watery diarrhea as well as polymicrobial paraspinal abscess s/p aspiration. Wound as colonized with MSSA. +/- GBS uti. She has portacath but only had 1 of 2 blood cx with GNR. Complains of ongoing diarrhea  Diarrhea  = cdiff is ruled out. Recommend to schedule immodium 2 x per day to see if it helps her symptoms.   Polymicrobial bacteremia with secondary paraspinal abscess = would suspect that she possibly had gram negative pathogen translocate to bloodstream and causes 2.4 cm paraspinal abscess. Recommend to treat for minimum of 3 wk. Can discharge on ertapenem for once a day dosing, currently on day 9 of 21 days   her portacath may also be seeded with klebsiella and enterobacter, though only 1 of 2 sets +.  repeat blood cx off of abtx to see if need to pull portacath.   Decub ulcers stage 4 and stage 3 = would need to ensure she has the proper home health to care for wounds, proper air mattress for home hospital bed to help with improvement possibly. She is colonized with MSSA and would get some benefit from tx bacteremia. Wound care is key especially to keep feculant material out of the wound  NSCLC = defer to oncology for further managmenet  DVT of right leg = currently on heparin  Will check back on Monday. If questions, please contact Dr. Illa Level, [facility] Cell: [phone] Pager: [phone]  06/23/2015, 2:04 PM

## 2015-06-23 NOTE — Progress Notes (Signed)
ANTICOAGULATION CONSULT NOTE - F/u Consult  Pharmacy Consult for heparin Indication: DVT  Allergies  Allergen Reactions  . Amlodipine Swelling  . Ciprofloxacin Hives and Other (See Comments)    REACTION: weakness  . Mirtazapine Other (See Comments)    Hallucinations and nightmares, verbally aggressive   . Statins Other (See Comments)  . Aspirin Swelling    Mouth swelling, tongue  . Cephalexin Hives  . Hydralazine Nausea And Vomiting  . Iron Nausea And Vomiting  . Lorazepam Other (See Comments)    Hallucinations, verbally aggressive  . Sulfonamide Derivatives Hives  . Penicillins Other (See Comments)    Has patient had a PCN reaction causing immediate rash, facial/tongue/throat swelling, SOB or lightheadedness with hypotension: Yes Has patient had a PCN reaction causing severe rash involving mucus membranes or skin necrosis: Yes Has patient had a PCN reaction that required hospitalization No Has patient had a PCN reaction occurring within the last 10 years: No If all of the above answers are "NO", then may proceed with Cephalosporin use.   . Shellfish Allergy Hives and Rash  . Tape Rash    Patient Measurements: Height: '5\' 8"'$  (172.7 cm) Weight: 132 lb 4.4 oz (60 kg) IBW/kg (Calculated) : 63.9 Heparin Dosing Weight: using actual body weight of 60 kg  Vital Signs: Temp: 98.2 F (36.8 C) (01/20 0629) Temp Source: Oral (01/20 0629) BP: 103/55 mmHg (01/20 0629) Pulse Rate: 89 (01/20 0629)  Labs:  Recent Labs  06/21/15 0537 06/22/15 0445 06/23/15 0450  HGB 8.7* 8.6* 8.2*  HCT 27.4* 27.5* 26.2*  PLT 161 161 155  HEPARINUNFRC 0.33 0.32 0.13*  CREATININE 1.89* 1.70* 1.67*    Estimated Creatinine Clearance: 25.9 mL/min (by C-G formula based on Cr of 1.67).   Medical History: Past Medical History  Diagnosis Date  . Hypertension   . Hypercholesterolemia   . CKD (chronic kidney disease)   . Coronary artery disease     a.  LHC (06/04/05): LHC done in [place]  with high grade RCA => s/p BMS to RCA;  b.  Nuclear (09/14/09): Lexiscan; Inf infarct with mild peri-infarct ishemia, EF 52%; Low Risk.  Marland Kitchen GERD (gastroesophageal reflux disease)   . Chronic anemia   . Ischemic cardiomyopathy     a. Echo (07/26/13): Mild LVH, EF 35-40%, diff HK, inf AK, Gr 2 DD, Tr AI, mildly dilated Ao root, MAC, mild MR, mild LAE, mod reduced RVSF.  . Non-small cell carcinoma of lung ([place])     Stage IV  . Chronic fatigue 04/04/2015  . Chronic fatigue 04/04/2015    Medications:  Scheduled:  . diphenhydrAMINE  50 mg Oral Once  . feeding supplement (PRO-STAT SUGAR FREE 64)  30 mL Oral BID  . gabapentin  100 mg Oral QHS  . imipenem-cilastatin  250 mg Intravenous 3 times per day  . loperamide  2 mg Oral Daily  . metoprolol tartrate  12.5 mg Oral BID  . pantoprazole  40 mg Oral Daily  . sodium bicarbonate  650 mg Oral BID  . sodium chloride  10-40 mL Intracatheter Q12H  . sodium chloride  3 mL Intravenous Q12H    Assessment: Pt is a 82 yo female diagnosed with DVT noted in the right common femoral vein, right femoral vein, right profunda femoral vein, right popliteal vein, and right proximal peroneal veins. Pt was previously on enoxaparin PTA but has not been on medication for greater than 30 days. Pharmacy consulted to being heparin infusion 1/14 PM.  D/t concern  of dropping Hgb levels, omitted heparin bolus & use lower threshold for anticoagulation level (anti-Xa 0.3 to 0.5).   Significant Events: 1/14 am paraspinal fluid aspiration by IR 1/14 pm heparin infusion initiated 1/17: pt received PRBC   Today, 06/23/2015:  Heparin level subtherapeutic on 750 units/hr infusion.  Rate confirmed and RN reports no interruptions in the infusion.  No bleeding reported.   Hgb slowly drifting down.  Pltc remains WNL.  . Goal of Therapy: Heparin level 0.3-0.5 units/ml Monitor platelets by anticoagulation protocol: Yes  Plan:  Increase heparin to 950  units/hr  Recheck heparin level at 3pm  Daily CBC, daily heparin level   Monitor for evidence of bleeding  Await plan for long term anticoagulation  Clayburn Pert, PharmD, BCPS Pager: [phone] 06/23/2015  6:50 AM    .

## 2015-06-23 NOTE — Progress Notes (Signed)
[facility]  Patient Status:   New pt this admission for [facility]  [facility] is providing the following services: [facility] will provide [facility] and home care services.  [facility] home infusion pharmacy team will partner with [facility] to provide home IV ABX.  [facility] will need script for IV Invanz as recommended by Dr. Baxter Flattery for home. We will follow to support transition home.   If patient discharges after hours, please call [phone].   Larry Sierras 06/23/2015, 3:26 PM

## 2015-06-24 DIAGNOSIS — R197 Diarrhea, unspecified: Secondary | ICD-10-CM | POA: Diagnosis present

## 2015-06-24 DIAGNOSIS — I82401 Acute embolism and thrombosis of unspecified deep veins of right lower extremity: Secondary | ICD-10-CM | POA: Diagnosis not present

## 2015-06-24 LAB — CBC
HCT: 25.2 % — ABNORMAL LOW (ref 36.0–46.0)
Hemoglobin: 8 g/dL — ABNORMAL LOW (ref 12.0–15.0)
MCH: 27.5 pg (ref 26.0–34.0)
MCHC: 31.7 g/dL (ref 30.0–36.0)
MCV: 86.6 fL (ref 78.0–100.0)
PLATELETS: 155 10*3/uL (ref 150–400)
RBC: 2.91 MIL/uL — ABNORMAL LOW (ref 3.87–5.11)
RDW: 15.3 % (ref 11.5–15.5)
WBC: 6.1 10*3/uL (ref 4.0–10.5)

## 2015-06-24 LAB — HEPARIN LEVEL (UNFRACTIONATED)
HEPARIN UNFRACTIONATED: 0.51 [IU]/mL (ref 0.30–0.70)
Heparin Unfractionated: 0.39 IU/mL (ref 0.30–0.70)
Heparin Unfractionated: 0.58 IU/mL (ref 0.30–0.70)

## 2015-06-24 LAB — BASIC METABOLIC PANEL
Anion gap: 7 (ref 5–15)
BUN: 44 mg/dL — AB (ref 6–20)
CALCIUM: 7.6 mg/dL — AB (ref 8.9–10.3)
CO2: 21 mmol/L — ABNORMAL LOW (ref 22–32)
CREATININE: 1.79 mg/dL — AB (ref 0.44–1.00)
Chloride: 111 mmol/L (ref 101–111)
GFR calc Af Amer: 30 mL/min — ABNORMAL LOW (ref >60)
GFR, EST NON AFRICAN AMERICAN: 26 mL/min — AB (ref >60)
GLUCOSE: 68 mg/dL (ref 65–99)
Potassium: 3.1 mmol/L — ABNORMAL LOW (ref 3.5–5.1)
Sodium: 139 mmol/L (ref 135–145)

## 2015-06-24 MED ORDER — POTASSIUM CHLORIDE CRYS ER 20 MEQ PO TBCR
40.0000 meq | EXTENDED_RELEASE_TABLET | Freq: Once | ORAL | Status: AC
  Administered 2015-06-24: 40 meq via ORAL
  Filled 2015-06-24: qty 2

## 2015-06-24 MED ORDER — HEPARIN (PORCINE) IN NACL 100-0.45 UNIT/ML-% IJ SOLN
950.0000 [IU]/h | INTRAMUSCULAR | Status: DC
  Filled 2015-06-24: qty 250

## 2015-06-24 MED ORDER — ENOXAPARIN SODIUM 60 MG/0.6ML ~~LOC~~ SOLN
1.0000 mg/kg | SUBCUTANEOUS | Status: DC
  Administered 2015-06-25 – 2015-07-02 (×8): 60 mg via SUBCUTANEOUS
  Filled 2015-06-24 (×9): qty 0.6

## 2015-06-24 MED ORDER — ENOXAPARIN SODIUM 60 MG/0.6ML ~~LOC~~ SOLN
1.0000 mg/kg | Freq: Once | SUBCUTANEOUS | Status: AC
Start: 2015-06-24 — End: 2015-06-24
  Administered 2015-06-24: 60 mg via SUBCUTANEOUS
  Filled 2015-06-24: qty 0.6

## 2015-06-24 NOTE — Progress Notes (Signed)
ANTICOAGULATION CONSULT NOTE - F/u Consult  Pharmacy Consult for heparin - change to Lovenox  Indication: DVT  Allergies  Allergen Reactions  . Amlodipine Swelling  . Ciprofloxacin Hives and Other (See Comments)    REACTION: weakness  . Mirtazapine Other (See Comments)    Hallucinations and nightmares, verbally aggressive   . Statins Other (See Comments)  . Aspirin Swelling    Mouth swelling, tongue  . Cephalexin Hives  . Hydralazine Nausea And Vomiting  . Iron Nausea And Vomiting  . Ambien [Zolpidem Tartrate] Other (See Comments)    confusion  . Lorazepam Other (See Comments)    Hallucinations, verbally aggressive  . Sulfonamide Derivatives Hives  . Zolpidem     Confusion  . Penicillins Other (See Comments)    Has patient had a PCN reaction causing immediate rash, facial/tongue/throat swelling, SOB or lightheadedness with hypotension: Yes Has patient had a PCN reaction causing severe rash involving mucus membranes or skin necrosis: Yes Has patient had a PCN reaction that required hospitalization No Has patient had a PCN reaction occurring within the last 10 years: No If all of the above answers are "NO", then may proceed with Cephalosporin use.   . Shellfish Allergy Hives and Rash  . Tape Rash    Patient Measurements: Height: '5\' 8"'$  (172.7 cm) Weight: 132 lb 4.4 oz (60 kg) IBW/kg (Calculated) : 63.9 Heparin Dosing Weight: using actual body weight of 60 kg  Vital Signs: Temp: 97.3 F (36.3 C) (01/21 1400) Temp Source: Oral (01/21 1400) BP: 100/48 mmHg (01/21 1400) Pulse Rate: 78 (01/21 1400)  Labs:  Recent Labs  06/22/15 0445 06/23/15 0450  06/24/15 06/24/15 0545 06/24/15 0855 06/24/15 1710  HGB 8.6* 8.2*  --   --  8.0*  --   --   HCT 27.5* 26.2*  --   --  25.2*  --   --   PLT 161 155  --   --  155  --   --   HEPARINUNFRC 0.32 0.13*  < > 0.39  --  0.58 0.51  CREATININE 1.70* 1.67*  --   --  1.79*  --   --   < > = values in this interval not  displayed.  Estimated Creatinine Clearance: 24.1 mL/min (by C-G formula based on Cr of 1.79).   Medical History: Past Medical History  Diagnosis Date  . Hypertension   . Hypercholesterolemia   . CKD (chronic kidney disease)   . Coronary artery disease     a.  LHC (06/04/05): LHC done in [place] with high grade RCA => s/p BMS to RCA;  b.  Nuclear (09/14/09): Lexiscan; Inf infarct with mild peri-infarct ishemia, EF 52%; Low Risk.  Marland Kitchen GERD (gastroesophageal reflux disease)   . Chronic anemia   . Ischemic cardiomyopathy     a. Echo (07/26/13): Mild LVH, EF 35-40%, diff HK, inf AK, Gr 2 DD, Tr AI, mildly dilated Ao root, MAC, mild MR, mild LAE, mod reduced RVSF.  . Non-small cell carcinoma of lung ([place])     Stage IV  . Chronic fatigue 04/04/2015  . Chronic fatigue 04/04/2015    Medications:  Scheduled:  . diphenhydrAMINE  50 mg Oral Once  . enoxaparin (LOVENOX) injection  1 mg/kg Subcutaneous Once  . [START ON 06/25/2015] enoxaparin (LOVENOX) injection  1 mg/kg Subcutaneous Q24H  . feeding supplement (PRO-STAT SUGAR FREE 64)  30 mL Oral BID  . gabapentin  100 mg Oral QHS  . imipenem-cilastatin  250 mg Intravenous 3  times per day  . loperamide  2 mg Oral BID  . metoprolol tartrate  12.5 mg Oral BID  . pantoprazole  40 mg Oral Daily  . potassium chloride  40 mEq Oral Once  . sodium bicarbonate  650 mg Oral BID  . sodium chloride  10-40 mL Intracatheter Q12H  . sodium chloride  3 mL Intravenous Q12H    Assessment: Pt is a 82 yo female diagnosed with DVT noted in the right common femoral vein, right femoral vein, right profunda femoral vein, right popliteal vein, and right proximal peroneal veins. Pt was previously on enoxaparin PTA but has not been on medication for greater than 30 days. Pharmacy consulted to being heparin infusion 1/14 PM.  D/t concern of dropping Hgb levels, omitted heparin bolus & use lower threshold for anticoagulation level (anti-Xa 0.3 to 0.5).    Significant Events: 1/14 am paraspinal fluid aspiration by IR 1/14 pm heparin infusion initiated 1/17: pt received PRBC   Today, 06/24/2015:  Changing IV heparin to full dose Lovenox  No bleeding reported.   Hgb slowly drifting down.  Pltc remains WNL.   Elevated SCr - CrCl 24 ml/min . Goal of Therapy: Heparin level 0.6-1 units/ml   Monitor platelets by anticoagulation protocol: Yes  Plan:  Discontinue IV heparin now  Start SQ Lovenox '1mg'$ /kg ('60mg'$ ) SQ q24 due to CrCl < 30 ml/min 1 hr after heparin stopped  Monitor for evidence of bleeding   Adrian Saran, PharmD, BCPS Pager [phone] 06/24/2015 6:00 PM    .

## 2015-06-24 NOTE — Progress Notes (Addendum)
Patient ID: [name], female   DOB: [birth year], 82 y.o.   MRN: [id number]  [facility] PROGRESS NOTE  [name] VFI:[id number] DOB: [birth year] DOA: 06/16/2015 PCP: Wende Neighbors, MD   Brief narrative:    Pt is 82 yo female with HT, CAD s/p stent in 2007, h/o chronic systolic chf, not a cnadidate for ace/arb due to ckd IV, she has been taken off blood pressure meds in 03/2015 due to progressive weakness and low bp, h/o stage IV NSCLC initially diagnosed in 2007, with disease recurrent in 11/2014, s/p XRT to right paraspinous mass with osseous invasion at L5 , finished on 12/23/2014, she was then treated with systemic chemo with [place] and paclitaxel which was stopped after two cycles due to intolerance, and mild disease progression, she is currently under immunotherapy with PD1 inhibitor, last on 12/23. She was sent from oncology clinic for evaluation of hypothermia and hypotension, diarrhea for the last two weeks.  Assessment/Plan:    Hypothermia/hypotension/sepsis - present on admission 1/13 with multiple possible source including sacral wound, right hip wound, paraspinal abscess, UTI - polymicrobial paraspinal abscess, s/p aspiration, wound colonized with MSSA, per ID recommend to treat for total 21 days, today is day #10 out of 21 days, can be discharged on primaxin daily dosing  - GBS UTI, GBS ruled out . Sepsis order set placed, culture obtained, empirically start on vanc/aztreonam, IV vanc d/c 1/16,  - ABX changed to Primaxin per ID  - appreciate assistance of ID team  - per ID, portacath may also be seeded with klebsiella and enterobacter, though only 1 of 2 sets +. - Also  recommendation is to repeat blood cx off of abtx to see if need to pull portacath.  - pt tolerating well so far  Paraspinal fluids collection/abscess - CT guided aspiration of the paraspinal fluids on 1/14, ABX as noted above per ID team - stable for now   Sacral wound: chronic, stage IV present  prior to this admission  - per daughter, patient was recently released from the wound care center, has been getting wound care by home health RN.  - per surgery, Dr. Hassell Done on 1/15, continue conservative management for now, if patient become septic again, may consider formal general surgery consult.  Right leg wound - right leg xray with extensive soft tissue swelling, edema vs infection, marked osteropenia? Osseous metastases - continue with wound care  - Lower extremity venous US + acute DVT - keep extremity elevated   Diarrhea - imodium as needed as it is helping, less diarrhea overnight   Bilateral pleural effusion - echocardiogram lvef 50-55% with grade I diastolic dysfunction - volume status stable   Acute on chronic kidney disease, stage III/IV with history of right sided hydronephrosis - s/p percutaneous nephrostomy tube,in 11/2014 which later removed  - Cr up from yesterday  - BMP in AM  Bilateral pedal edema - right lower extremity edema, H/o bilateral lower extremity DVT: was on lovenox , per daughter lovenox discontinued  Acute DVT - right common femoral vein, right femoral vein, right profunda femoral vein, right popliteal vein, and right proximal peroneal veins. - heparin drip vs ivc filter? Patient was on lovenox for previous DVt, but was taken off lovenox two months ago for unclear reason. - 1/14 started heparin drip for now, monitor hgb and sign.  - Primary hematology/oncologist Dr Julien Nordmann consulted, he recommended lovenox - resume Lovenox  Stage IV lung cancer, metastatic  - Dr Julien Nordmann consulted, appreciate recommendations,  pt will have outpatient follow up  Anemia, likely anemia of chronic disease, malignancy  - Hg stable in the past 24 hours  - CBC in AM  Hypokalemia from diarrhea  - supplement and repeat BMP In AM  CAD s/p stent in 2007 - h/o chronic systolic chf: echo pending  HTN, essential  - has been taken off bp meds since 03/2015 due to  progressive weakness and low bp - started low dose lopressor on 1/15 - reasonable inpatient control for now   Report new onset of afib  - on night of 1/16 to 1/17 - on heparin drip, continue betablocker, keep k>4, mag >2.  - transition to Lovenox   Moderate to Severe malnutrition - bedbound status, very low amount of subc fat on CT scan, recent weight loss, nutrition consulted   DVT prophylaxis - on heparin drip   Code Status: Full.  Family Communication:  plan of care discussed with the patient Disposition Plan: Home by 1/23  IV access:  Peripheral IV  Procedures and diagnostic studies:    Ct Abdomen Pelvis Chest Wo Contrast 06/14/2015  New small to moderate bilateral effusions. Associated collapse/consolidation in the right lower lobe, possibly due to pneumonia. Underlying disease recurrence cannot be excluded. 2. Spiculated left upper lobe nodule is stable to minimally decreased in size. Additional scattered pulmonary nodules in the right lung are better seen on the prior exam. 3. Stable osseous metastatic disease. 4. Thick walled collection of fluid and air in the paraspinous musculature overlying the right aspect of the lumbar spine, largely new from 03/29/2015. Finding may be due to an abscess. Please correlate clinically. 5. Heterogeneous low-attenuation lesion in the upper pole right kidney, stable but indeterminate. 6. Ascending aortic aneurysm. Small suprarenal abdominal aortic aneurysm.   Ct Aspiration 06/17/2015 Technically successful CT-guided aspiration of right paraspinal collection. E  Dg Chest Port 1 View 06/16/2015  No significant change when compared to the recent chest CT. 2. Bilateral pleural effusions with associated lung base atelectasis. No convincing pneumonia or edema. 1 cm left upper lobe spiculated nodule.   Dg Foot 2 Views Right 06/16/2015 Moderate osteopenia with diffuse heterogeneity of the marrow. This likely represents osseous metastases. Similar  appearance could be seen with severe osteopenia. 2. Extensive soft tissue swelling. This may represent edema or infection. There is no underlying bone destruction.   Medical Consultants:  Oncology Dr Benay Spice ( over the phone on 1/13) , Dr Alen Blew on 1/14, primary oncologist Dr. Julien Nordmann notified on Monday 1/16. IR for paraspinous fluids collection drain ( talked To Dr. Kathlene Cote) General surgery Dr Hassell Done on 1/15, please see detail above. Infectious disease   Faye Ramsay, MD  [facility] Pager [phone]  If 7PM-7AM, please contact night-coverage [email or web address] Password [email or web address] 06/24/2015, 5:43 PM   LOS: 8 days   HPI/Subjective: No events overnight.   Objective: Filed Vitals:   06/23/15 1409 06/23/15 2237 06/24/15 0610 06/24/15 1400  BP: 105/46 106/50 114/57 100/48  Pulse: 82 85 79 78  Temp: 97.9 F (36.6 C) 98 F (36.7 C) 97.7 F (36.5 C) 97.3 F (36.3 C)  TempSrc: Oral Oral Oral Oral  Resp: '18 18 18 18  '$ Height:      Weight:      SpO2: 98% 100% 97% 99%    Intake/Output Summary (Last 24 hours) at 06/24/15 1743 Last data filed at 06/24/15 1400  Gross per 24 hour  Intake 1732.88 ml  Output    500 ml  Net 1232.88 ml  Exam:   General:  Pt is alert, follows commands appropriately, not in acute distress  Cardiovascular: Irregular rate and rhythm, no rubs, no gallops  Respiratory: Clear to auscultation bilaterally, no wheezing, no crackles, no rhonchi  Data Reviewed: Basic Metabolic Panel:  Recent Labs Lab 06/20/15 0515 06/21/15 0537 06/22/15 0445 06/23/15 0450 06/24/15 0545  NA 138 141 141 141 139  K 3.2* 3.5 3.5 3.5 3.1*  CL 111 114* 112* 113* 111  CO2 19* 21* 20* 22 21*  GLUCOSE 68 78 67 74 68  BUN 38* 41* 42* 45* 44*  CREATININE 1.79* 1.89* 1.70* 1.67* 1.79*  CALCIUM 6.9* 7.5* 7.6* 7.6* 7.6*   CBC:  Recent Labs Lab 06/20/15 2315 06/21/15 0537 06/22/15 0445 06/23/15 0450 06/24/15 0545  WBC 4.2 5.0 5.6 5.9 6.1  HGB 8.6* 8.7* 8.6* 8.2* 8.0*   HCT 26.8* 27.4* 27.5* 26.2* 25.2*  MCV 87.9 87.8 89.0 89.1 86.6  PLT 145* 161 161 155 155   Cardiac Enzymes:  Recent Labs Lab 06/20/15 0515  TROPONINI <0.03   Recent Results (from the past 240 hour(s))  Culture, blood (x 2)     Status: None   Collection Time: 06/16/15  4:25 PM  Result Value Ref Range Status   Specimen Description BLOOD RIGHT ARM  Final   Special Requests BOTTLES DRAWN AEROBIC AND ANAEROBIC  5CC, 4CC  Final   Culture  Setup Time   Final    GRAM NEGATIVE RODS IN BOTH AEROBIC AND ANAEROBIC BOTTLES CRITICAL RESULT CALLED TO, READ BACK BY AND VERIFIED WITH: Windell Norfolk AT [id number] ON [id number] BY Rhea Bleacher    Culture   Final    KLEBSIELLA OXYTOCA ENTEROBACTER CLOACAE Performed at [facility]    Report Status 06/19/2015 FINAL  Final   Organism ID, Bacteria KLEBSIELLA OXYTOCA  Final   Organism ID, Bacteria ENTEROBACTER CLOACAE  Final      Susceptibility   Enterobacter cloacae - MIC*    CEFAZOLIN >=64 RESISTANT Resistant     CEFEPIME <=1 SENSITIVE Sensitive     CEFTAZIDIME <=1 SENSITIVE Sensitive     CEFTRIAXONE <=1 SENSITIVE Sensitive     CIPROFLOXACIN <=0.25 SENSITIVE Sensitive     GENTAMICIN <=1 SENSITIVE Sensitive     IMIPENEM 0.5 SENSITIVE Sensitive     TRIMETH/SULFA >=320 RESISTANT Resistant     PIP/TAZO <=4 SENSITIVE Sensitive     * ENTEROBACTER CLOACAE   Klebsiella oxytoca - MIC*    AMPICILLIN >=32 RESISTANT Resistant     CEFAZOLIN >=64 RESISTANT Resistant     CEFEPIME <=1 SENSITIVE Sensitive     CEFTAZIDIME <=1 SENSITIVE Sensitive     CEFTRIAXONE <=1 SENSITIVE Sensitive     CIPROFLOXACIN <=0.25 SENSITIVE Sensitive     GENTAMICIN <=1 SENSITIVE Sensitive     IMIPENEM <=0.25 SENSITIVE Sensitive     TRIMETH/SULFA <=20 SENSITIVE Sensitive     AMPICILLIN/SULBACTAM 4 SENSITIVE Sensitive     PIP/TAZO <=4 SENSITIVE Sensitive     * KLEBSIELLA OXYTOCA  Culture, blood (x 2)     Status: None   Collection Time: 06/16/15  4:40 PM  Result Value Ref  Range Status   Specimen Description BLOOD LEFT ARM  Final   Special Requests IN PEDIATRIC BOTTLE 0 .5CC  Final   Culture   Final    NO GROWTH 5 DAYS Performed at [facility]    Report Status 06/21/2015 FINAL  Final  Wound culture     Status: None   Collection Time: 06/16/15  5:40 PM  Result Value Ref Range Status   Specimen Description SACRAL  Final   Special Requests NONE  Final   Gram Stain   Final    NO WBC SEEN NO SQUAMOUS EPITHELIAL CELLS SEEN RARE GRAM POSITIVE COCCI IN PAIRS IN CLUSTERS Performed at [facility]    Culture   Final    FEW STAPHYLOCOCCUS AUREUS Note: RIFAMPIN AND GENTAMICIN SHOULD NOT BE USED AS SINGLE DRUGS FOR TREATMENT OF STAPH INFECTIONS. Performed at [facility]    Report Status 06/19/2015 FINAL  Final   Organism ID, Bacteria STAPHYLOCOCCUS AUREUS  Final      Susceptibility   Staphylococcus aureus - MIC*    CLINDAMYCIN <=0.25 SENSITIVE Sensitive     ERYTHROMYCIN <=0.25 SENSITIVE Sensitive     GENTAMICIN <=0.5 SENSITIVE Sensitive     LEVOFLOXACIN <=0.12 SENSITIVE Sensitive     OXACILLIN 0.5 SENSITIVE Sensitive     RIFAMPIN <=0.5 SENSITIVE Sensitive     TRIMETH/SULFA <=10 SENSITIVE Sensitive     VANCOMYCIN <=0.5 SENSITIVE Sensitive     TETRACYCLINE >=16 RESISTANT Resistant     MOXIFLOXACIN <=0.25 SENSITIVE Sensitive     * FEW STAPHYLOCOCCUS AUREUS  Wound culture     Status: None   Collection Time: 06/16/15  5:42 PM  Result Value Ref Range Status   Specimen Description FOOT RIGHT  Final   Special Requests NONE  Final   Gram Stain   Final    FEW WBC PRESENT,BOTH PMN AND MONONUCLEAR FEW SQUAMOUS EPITHELIAL CELLS PRESENT MODERATE GRAM NEGATIVE RODS FEW GRAM POSITIVE COCCI IN PAIRS Performed at [facility]    Culture   Final    ABUNDANT STAPHYLOCOCCUS AUREUS Note: RIFAMPIN AND GENTAMICIN SHOULD NOT BE USED AS SINGLE DRUGS FOR TREATMENT OF STAPH INFECTIONS. Performed at [facility]    Report Status  06/21/2015 FINAL  Final   Organism ID, Bacteria STAPHYLOCOCCUS AUREUS  Final      Susceptibility   Staphylococcus aureus - MIC*    CLINDAMYCIN <=0.25 SENSITIVE Sensitive     ERYTHROMYCIN <=0.25 SENSITIVE Sensitive     GENTAMICIN <=0.5 SENSITIVE Sensitive     LEVOFLOXACIN 0.25 SENSITIVE Sensitive     OXACILLIN 0.5 SENSITIVE Sensitive     RIFAMPIN <=0.5 SENSITIVE Sensitive     TRIMETH/SULFA <=10 SENSITIVE Sensitive     VANCOMYCIN <=0.5 SENSITIVE Sensitive     TETRACYCLINE >=16 RESISTANT Resistant     MOXIFLOXACIN <=0.25 SENSITIVE Sensitive     * ABUNDANT STAPHYLOCOCCUS AUREUS  MRSA PCR Screening     Status: None   Collection Time: 06/17/15 10:10 AM  Result Value Ref Range Status   MRSA by PCR NEGATIVE NEGATIVE Final    Comment:        The GeneXpert MRSA Assay (FDA approved for NASAL specimens only), is one component of a comprehensive MRSA colonization surveillance program. It is not intended to diagnose MRSA infection nor to guide or monitor treatment for MRSA infections.   Culture, Urine     Status: None   Collection Time: 06/17/15 11:53 AM  Result Value Ref Range Status   Specimen Description URINE, RANDOM  Final   Special Requests NONE  Final   Culture   Final    >=100,000 COLONIES/mL GROUP B STREP(S.AGALACTIAE)ISOLATED TESTING AGAINST S. AGALACTIAE NOT ROUTINELY PERFORMED DUE TO PREDICTABILITY OF AMP/PEN/VAN SUSCEPTIBILITY. Performed at [facility]    Report Status 06/19/2015 FINAL  Final  Culture, routine-abscess     Status: None   Collection  Time: 06/17/15  1:16 PM  Result Value Ref Range Status   Specimen Description ABSCESS RIGHT PARASPINAL LUMBAR UNDER CT  Final   Special Requests Normal  Final   Gram Stain   Final    ABUNDANT WBC PRESENT, PREDOMINANTLY PMN NO SQUAMOUS EPITHELIAL CELLS SEEN NO ORGANISMS SEEN Performed at [facility]    Culture   Final    MULTIPLE ORGANISMS PRESENT, NONE PREDOMINANT Note: NO STAPHYLOCOCCUS AUREUS ISOLATED  NO GROUP A STREP (S.PYOGENES) ISOLATED Performed at [facility]    Report Status 06/20/2015 FINAL  Final  C difficile quick scan w PCR reflex     Status: None   Collection Time: 06/19/15  7:47 PM  Result Value Ref Range Status   C Diff antigen NEGATIVE NEGATIVE Final   C Diff toxin NEGATIVE NEGATIVE Final   C Diff interpretation Negative for toxigenic C. difficile  Final     Scheduled Meds: . diphenhydrAMINE  50 mg Oral Once  . feeding supplement (PRO-STAT SUGAR FREE 64)  30 mL Oral BID  . gabapentin  100 mg Oral QHS  . imipenem-cilastatin  250 mg Intravenous 3 times per day  . loperamide  2 mg Oral BID  . metoprolol tartrate  12.5 mg Oral BID  . pantoprazole  40 mg Oral Daily  . sodium bicarbonate  650 mg Oral BID  . sodium chloride  10-40 mL Intracatheter Q12H  . sodium chloride  3 mL Intravenous Q12H   Continuous Infusions: . sodium chloride 10 mL/hr at 06/23/15 1013  . heparin 950 Units/hr (06/24/15 1100)

## 2015-06-24 NOTE — Progress Notes (Signed)
ANTICOAGULATION CONSULT NOTE - F/u Consult  Pharmacy Consult for heparin Indication: DVT  Allergies  Allergen Reactions  . Amlodipine Swelling  . Ciprofloxacin Hives and Other (See Comments)    REACTION: weakness  . Mirtazapine Other (See Comments)    Hallucinations and nightmares, verbally aggressive   . Statins Other (See Comments)  . Aspirin Swelling    Mouth swelling, tongue  . Cephalexin Hives  . Hydralazine Nausea And Vomiting  . Iron Nausea And Vomiting  . Ambien [Zolpidem Tartrate] Other (See Comments)    confusion  . Lorazepam Other (See Comments)    Hallucinations, verbally aggressive  . Sulfonamide Derivatives Hives  . Zolpidem     Confusion  . Penicillins Other (See Comments)    Has patient had a PCN reaction causing immediate rash, facial/tongue/throat swelling, SOB or lightheadedness with hypotension: Yes Has patient had a PCN reaction causing severe rash involving mucus membranes or skin necrosis: Yes Has patient had a PCN reaction that required hospitalization No Has patient had a PCN reaction occurring within the last 10 years: No If all of the above answers are "NO", then may proceed with Cephalosporin use.   . Shellfish Allergy Hives and Rash  . Tape Rash    Patient Measurements: Height: '5\' 8"'$  (172.7 cm) Weight: 132 lb 4.4 oz (60 kg) IBW/kg (Calculated) : 63.9 Heparin Dosing Weight: using actual body weight of 60 kg  Vital Signs: Temp: 97.7 F (36.5 C) (01/21 0610) Temp Source: Oral (01/21 0610) BP: 114/57 mmHg (01/21 0610) Pulse Rate: 79 (01/21 0610)  Labs:  Recent Labs  06/22/15 0445 06/23/15 0450 06/23/15 1400 06/24/15 06/24/15 0545 06/24/15 0855  HGB 8.6* 8.2*  --   --  8.0*  --   HCT 27.5* 26.2*  --   --  25.2*  --   PLT 161 155  --   --  155  --   HEPARINUNFRC 0.32 0.13* 0.28* 0.39  --  0.58  CREATININE 1.70* 1.67*  --   --  1.79*  --     Estimated Creatinine Clearance: 24.1 mL/min (by C-G formula based on Cr of  1.79).   Medical History: Past Medical History  Diagnosis Date  . Hypertension   . Hypercholesterolemia   . CKD (chronic kidney disease)   . Coronary artery disease     a.  LHC (06/04/05): LHC done in [place] with high grade RCA => s/p BMS to RCA;  b.  Nuclear (09/14/09): Lexiscan; Inf infarct with mild peri-infarct ishemia, EF 52%; Low Risk.  Marland Kitchen GERD (gastroesophageal reflux disease)   . Chronic anemia   . Ischemic cardiomyopathy     a. Echo (07/26/13): Mild LVH, EF 35-40%, diff HK, inf AK, Gr 2 DD, Tr AI, mildly dilated Ao root, MAC, mild MR, mild LAE, mod reduced RVSF.  . Non-small cell carcinoma of lung ([place])     Stage IV  . Chronic fatigue 04/04/2015  . Chronic fatigue 04/04/2015    Medications:  Scheduled:  . diphenhydrAMINE  50 mg Oral Once  . feeding supplement (PRO-STAT SUGAR FREE 64)  30 mL Oral BID  . gabapentin  100 mg Oral QHS  . imipenem-cilastatin  250 mg Intravenous 3 times per day  . loperamide  2 mg Oral BID  . metoprolol tartrate  12.5 mg Oral BID  . pantoprazole  40 mg Oral Daily  . sodium bicarbonate  650 mg Oral BID  . sodium chloride  10-40 mL Intracatheter Q12H  . sodium chloride  3  mL Intravenous Q12H    Assessment: Pt is a 82 yo female diagnosed with DVT noted in the right common femoral vein, right femoral vein, right profunda femoral vein, right popliteal vein, and right proximal peroneal veins. Pt was previously on enoxaparin PTA but has not been on medication for greater than 30 days. Pharmacy consulted to being heparin infusion 1/14 PM.  D/t concern of dropping Hgb levels, omitted heparin bolus & use lower threshold for anticoagulation level (anti-Xa 0.3 to 0.5).   Significant Events: 1/14 am paraspinal fluid aspiration by IR 1/14 pm heparin infusion initiated 1/17: pt received PRBC   Today, 06/24/2015:  Heparin level slightly supratherapeutic now on current rate of 1000 units/hr infusion.  Rate confirmed and RN reports no interruptions  in the infusion.  No bleeding reported.   Hgb slowly drifting down.  Pltc remains WNL.  . Goal of Therapy: Heparin level 0.3-0.5 units/ml as stated above Monitor platelets by anticoagulation protocol: Yes  Plan:  Decrease heparin to 950 units/hr  Recheck heparin level at 5pm  Daily CBC, daily heparin level   Monitor for evidence of bleeding  Await plan for long term anticoagulation   Adrian Saran, PharmD, BCPS Pager [phone] 06/24/2015 10:28 AM    .

## 2015-06-24 NOTE — Progress Notes (Signed)
Protocol: Heparin Indication: DVT  Assessment:  0000 HL=0.39, on 1000 units/hr (at goal of 0.3-0.5)  No problems reported by RN  Plan:  Continue heparin drip @ 1000 units/hr  Recheck HL @ 0800 1/21   Thanks, Dorrene German 06/24/2015

## 2015-06-25 DIAGNOSIS — N183 Chronic kidney disease, stage 3 (moderate): Secondary | ICD-10-CM

## 2015-06-25 DIAGNOSIS — N179 Acute kidney failure, unspecified: Secondary | ICD-10-CM

## 2015-06-25 LAB — BASIC METABOLIC PANEL
Anion gap: 6 (ref 5–15)
BUN: 46 mg/dL — AB (ref 6–20)
CO2: 21 mmol/L — ABNORMAL LOW (ref 22–32)
Calcium: 7.7 mg/dL — ABNORMAL LOW (ref 8.9–10.3)
Chloride: 111 mmol/L (ref 101–111)
Creatinine, Ser: 1.86 mg/dL — ABNORMAL HIGH (ref 0.44–1.00)
GFR calc non Af Amer: 25 mL/min — ABNORMAL LOW (ref >60)
GFR, EST AFRICAN AMERICAN: 29 mL/min — AB (ref >60)
GLUCOSE: 84 mg/dL (ref 65–99)
Potassium: 4.1 mmol/L (ref 3.5–5.1)
SODIUM: 138 mmol/L (ref 135–145)

## 2015-06-25 LAB — CBC
HCT: 25.3 % — ABNORMAL LOW (ref 36.0–46.0)
HEMOGLOBIN: 7.9 g/dL — AB (ref 12.0–15.0)
MCH: 28 pg (ref 26.0–34.0)
MCHC: 31.2 g/dL (ref 30.0–36.0)
MCV: 89.7 fL (ref 78.0–100.0)
Platelets: 157 10*3/uL (ref 150–400)
RBC: 2.82 MIL/uL — ABNORMAL LOW (ref 3.87–5.11)
RDW: 15.6 % — AB (ref 11.5–15.5)
WBC: 7.5 10*3/uL (ref 4.0–10.5)

## 2015-06-25 LAB — HEPARIN LEVEL (UNFRACTIONATED): HEPARIN UNFRACTIONATED: 0.3 [IU]/mL (ref 0.30–0.70)

## 2015-06-25 NOTE — Progress Notes (Signed)
Pharmacy Antibiotic Follow-up Note  [name] is a 82 y.o. year-old female admitted on 06/16/2015.  The patient is currently on day 10 total abx's and day 7 of primaxin for sepsis d/t bacteremia with secondary paraspinal abscess and multiple chronic wounds.  Assessment/Plan:  Continue Primaxin 250 mg IV q8 hr per current renal function.  Per ID will need 2-4 weeks of therapy. PCN/cephalosporin allergies noted, but has tolerated both imipenem and ertapenem previously - plan per ID to transition to Invanz at discharge   Temp (24hrs), Avg:97.8 F (36.6 C), Min:97.3 F (36.3 C), Max:98.2 F (36.8 C)   Recent Labs Lab 06/21/15 0537 06/22/15 0445 06/23/15 0450 06/24/15 0545 06/25/15 0430  WBC 5.0 5.6 5.9 6.1 7.5     Recent Labs Lab 06/21/15 0537 06/22/15 0445 06/23/15 0450 06/24/15 0545 06/25/15 0430  CREATININE 1.89* 1.70* 1.67* 1.79* 1.86*   Estimated Creatinine Clearance: 23.2 mL/min (by C-G formula based on Cr of 1.86).    Allergies  Allergen Reactions  . Amlodipine Swelling  . Ciprofloxacin Hives and Other (See Comments)    REACTION: weakness  . Mirtazapine Other (See Comments)    Hallucinations and nightmares, verbally aggressive   . Statins Other (See Comments)  . Aspirin Swelling    Mouth swelling, tongue  . Cephalexin Hives  . Hydralazine Nausea And Vomiting  . Iron Nausea And Vomiting  . Ambien [Zolpidem Tartrate] Other (See Comments)    confusion  . Lorazepam Other (See Comments)    Hallucinations, verbally aggressive  . Sulfonamide Derivatives Hives  . Zolpidem     Confusion  . Penicillins Other (See Comments)    Has patient had a PCN reaction causing immediate rash, facial/tongue/throat swelling, SOB or lightheadedness with hypotension: Yes Has patient had a PCN reaction causing severe rash involving mucus membranes or skin necrosis: Yes Has patient had a PCN reaction that required hospitalization No Has patient had a PCN reaction occurring  within the last 10 years: No If all of the above answers are "NO", then may proceed with Cephalosporin use.   . Shellfish Allergy Hives and Rash  . Tape Rash    Antimicrobials this admission: 1/13 >> vancomycin >> 1/16 1/13 >> aztreonam >>   1/16 1/13 >> vancomycin PO (empiric) >> 1/16 1/16 >> primaxin >>  Levels/dose changes this admission: none  Microbiology results: 1/13 blood 1 of 2: 1) klebsiella oxytoca - resistant to amp, 2) enterobacter - resistant to Ancef, Bactrim 1/13 wound (sacral wound): few MSSA (R-tetracyc only) 1/13 wound (R foot): abundant MSSA 1/14 MRSA screen neg 1/14 abscess cx: multiple org present, none predom 1/14 urine: >100k group B strep Cdiff 1/16: negative   Adrian Saran, PharmD, BCPS Pager [phone] 06/25/2015 10:58 AM

## 2015-06-25 NOTE — Progress Notes (Signed)
Patient ID: [name], female   DOB: 05-Mar-1934, 82 y.o.   MRN: [id number]  [facility] PROGRESS NOTE  [name] WUJ:[id number] DOB: Mar 16, 1934 DOA: 06/16/2015 PCP: Wende Neighbors, MD   Brief narrative:    Pt is 82 yo female with HT, CAD s/p stent in 2007, h/o chronic systolic chf, not a cnadidate for ace/arb due to ckd IV, she has been taken off blood pressure meds in 03/2015 due to progressive weakness and low bp, h/o stage IV NSCLC initially diagnosed in 2007, with disease recurrent in 11/2014, s/p XRT to right paraspinous mass with osseous invasion at L5 , finished on 12/23/2014, she was then treated with systemic chemo with [place] and paclitaxel which was stopped after two cycles due to intolerance, and mild disease progression, she is currently under immunotherapy with PD1 inhibitor, last on 12/23. She was sent from oncology clinic for evaluation of hypothermia and hypotension, diarrhea for the last two weeks.  Assessment/Plan:    Hypothermia/hypotension/sepsis - present on admission 1/13 with multiple possible source including sacral wound, right hip wound, paraspinal abscess, UTI - polymicrobial paraspinal abscess, s/p aspiration, wound colonized with MSSA, per ID recommend to treat for total 21 days, today is day #10 out of 21 days, can be discharged on primaxin daily dosing  - GBS UTI, GBS ruled out . Sepsis order set placed, culture obtained, empirically start on vanc/aztreonam, IV vanc d/c 1/16,  - ABX changed to Primaxin per ID  - appreciate assistance of ID team  - per ID, portacath may also be seeded with klebsiella and enterobacter, though only 1 of 2 sets +. - Also  recommendation is to repeat blood cx off of abtx to see if need to pull portacath.  - pt tolerating well so far  Paraspinal fluids collection/abscess - CT guided aspiration of the paraspinal fluids on 1/14, ABX as noted above per ID team - stable for now   Sacral wound: chronic, stage IV present  prior to this admission  - per daughter, patient was recently released from the wound care center, has been getting wound care by home health RN.  - per surgery, Dr. Hassell Done on 1/15, continue conservative management for now  Right leg wound - right leg xray with extensive soft tissue swelling, edema vs infection, marked osteropenia? Osseous metastases - continue with wound care  - Lower extremity venous US + acute DVT - keep extremity elevated   Diarrhea - imodium as needed as it is helping, less diarrhea overnight   Bilateral pleural effusion - echocardiogram lvef 50-55% with grade I diastolic dysfunction - volume status stable   Acute on chronic kidney disease, stage III/IV with history of right sided hydronephrosis - s/p percutaneous nephrostomy tube,in 11/2014 which later removed  - Cr up from yesterday, needs close monitoring - BMP in AM  Bilateral pedal edema - right lower extremity edema, H/o bilateral lower extremity DVT: was on lovenox , per daughter lovenox discontinued  Acute DVT - right common femoral vein, right femoral vein, right profunda femoral vein, right popliteal vein, and right proximal peroneal veins. - heparin drip vs ivc filter? Patient was on lovenox for previous DVt, but was taken off lovenox two months ago for unclear reason. - 1/14 started heparin drip for now, monitor hgb and sign.  - Primary hematology/oncologist Dr Julien Nordmann consulted, he recommended lovenox - started Lovenox per pharmacy   Stage IV lung cancer, metastatic  - Dr Julien Nordmann consulted, appreciate recommendations, pt will have outpatient follow up  Anemia of chronic disease, malignancy  - Hg stable in the past 24 hours  - CBC in AM  Hypokalemia from diarrhea  - supplemented and WNL   CAD s/p stent in 2007 - h/o chronic systolic chf: echo pending  HTN, essential  - has been taken off bp meds since 03/2015 due to progressive weakness and low bp - started low dose lopressor on  1/15 - reasonable inpatient control for now   Report new onset of afib  - on night of 1/16 to 1/17 - on heparin drip, continue betablocker, keep k>4, mag >2.  - transition to Lovenox   Moderate to Severe malnutrition - bedbound status, very low amount of subc fat on CT scan, recent weight loss, nutrition consulted   DVT prophylaxis - on heparin drip   Code Status: Full.  Family Communication:  plan of care discussed with the patient Disposition Plan: Home by 1/23  IV access:  Peripheral IV  Procedures and diagnostic studies:    Ct Abdomen Pelvis Chest Wo Contrast 06/14/2015  New small to moderate bilateral effusions. Associated collapse/consolidation in the right lower lobe, possibly due to pneumonia. Underlying disease recurrence cannot be excluded. 2. Spiculated left upper lobe nodule is stable to minimally decreased in size. Additional scattered pulmonary nodules in the right lung are better seen on the prior exam. 3. Stable osseous metastatic disease. 4. Thick walled collection of fluid and air in the paraspinous musculature overlying the right aspect of the lumbar spine, largely new from 03/29/2015. Finding may be due to an abscess. Please correlate clinically. 5. Heterogeneous low-attenuation lesion in the upper pole right kidney, stable but indeterminate. 6. Ascending aortic aneurysm. Small suprarenal abdominal aortic aneurysm.   Ct Aspiration 06/17/2015 Technically successful CT-guided aspiration of right paraspinal collection. E  Dg Chest Port 1 View 06/16/2015  No significant change when compared to the recent chest CT. 2. Bilateral pleural effusions with associated lung base atelectasis. No convincing pneumonia or edema. 1 cm left upper lobe spiculated nodule.   Dg Foot 2 Views Right 06/16/2015 Moderate osteopenia with diffuse heterogeneity of the marrow. This likely represents osseous metastases. Similar appearance could be seen with severe osteopenia. 2. Extensive soft tissue  swelling. This may represent edema or infection. There is no underlying bone destruction.   Medical Consultants:  Oncology Dr Benay Spice ( over the phone on 1/13) , Dr Alen Blew on 1/14, primary oncologist Dr. Julien Nordmann notified on Monday 1/16. IR for paraspinous fluids collection drain ( talked To Dr. Kathlene Cote) General surgery Dr Hassell Done on 1/15, please see detail above. Infectious disease   Faye Ramsay, MD  [facility] Pager [phone]  If 7PM-7AM, please contact night-coverage [email or web address] Password [facility] 06/25/2015, 5:33 PM   LOS: 9 days   HPI/Subjective: No events overnight.   Objective: Filed Vitals:   06/24/15 1952 06/24/15 2312 06/25/15 0445 06/25/15 1301  BP: 115/49 101/50 111/52 108/44  Pulse:  77 88 83  Temp:  98.2 F (36.8 C) 97.8 F (36.6 C) 97.8 F (36.6 C)  TempSrc:  Oral Oral Oral  Resp:  18  18  Height:      Weight:      SpO2:  99% 97% 98%    Intake/Output Summary (Last 24 hours) at 06/25/15 1733 Last data filed at 06/25/15 1716  Gross per 24 hour  Intake    240 ml  Output    825 ml  Net   -585 ml    Exam:   General:  Pt is alert, follows  commands appropriately, not in acute distress  Cardiovascular: Irregular rate and rhythm, no rubs, no gallops  Respiratory: Clear to auscultation bilaterally, no wheezing, no crackles, no rhonchi  Data Reviewed: Basic Metabolic Panel:  Recent Labs Lab 06/21/15 0537 06/22/15 0445 06/23/15 0450 06/24/15 0545 06/25/15 0430  NA 141 141 141 139 138  K 3.5 3.5 3.5 3.1* 4.1  CL 114* 112* 113* 111 111  CO2 21* 20* 22 21* 21*  GLUCOSE 78 67 74 68 84  BUN 41* 42* 45* 44* 46*  CREATININE 1.89* 1.70* 1.67* 1.79* 1.86*  CALCIUM 7.5* 7.6* 7.6* 7.6* 7.7*   CBC:  Recent Labs Lab 06/21/15 0537 06/22/15 0445 06/23/15 0450 06/24/15 0545 06/25/15 0430  WBC 5.0 5.6 5.9 6.1 7.5  HGB 8.7* 8.6* 8.2* 8.0* 7.9*  HCT 27.4* 27.5* 26.2* 25.2* 25.3*  MCV 87.8 89.0 89.1 86.6 89.7  PLT 161 161 155 155 157   Cardiac  Enzymes:  Recent Labs Lab 06/20/15 0515  TROPONINI <0.03   Recent Results (from the past 240 hour(s))  Culture, blood (x 2)     Status: None   Collection Time: 06/16/15  4:25 PM  Result Value Ref Range Status   Specimen Description BLOOD RIGHT ARM  Final   Special Requests BOTTLES DRAWN AEROBIC AND ANAEROBIC  5CC, 4CC  Final   Culture  Setup Time   Final    GRAM NEGATIVE RODS IN BOTH AEROBIC AND ANAEROBIC BOTTLES CRITICAL RESULT CALLED TO, READ BACK BY AND VERIFIED WITH: Windell Norfolk AT [id number] ON [id number] BY Rhea Bleacher    Culture   Final    KLEBSIELLA OXYTOCA ENTEROBACTER CLOACAE Performed at [facility]    Report Status 06/19/2015 FINAL  Final   Organism ID, Bacteria KLEBSIELLA OXYTOCA  Final   Organism ID, Bacteria ENTEROBACTER CLOACAE  Final      Susceptibility   Enterobacter cloacae - MIC*    CEFAZOLIN >=64 RESISTANT Resistant     CEFEPIME <=1 SENSITIVE Sensitive     CEFTAZIDIME <=1 SENSITIVE Sensitive     CEFTRIAXONE <=1 SENSITIVE Sensitive     CIPROFLOXACIN <=0.25 SENSITIVE Sensitive     GENTAMICIN <=1 SENSITIVE Sensitive     IMIPENEM 0.5 SENSITIVE Sensitive     TRIMETH/SULFA >=320 RESISTANT Resistant     PIP/TAZO <=4 SENSITIVE Sensitive     * ENTEROBACTER CLOACAE   Klebsiella oxytoca - MIC*    AMPICILLIN >=32 RESISTANT Resistant     CEFAZOLIN >=64 RESISTANT Resistant     CEFEPIME <=1 SENSITIVE Sensitive     CEFTAZIDIME <=1 SENSITIVE Sensitive     CEFTRIAXONE <=1 SENSITIVE Sensitive     CIPROFLOXACIN <=0.25 SENSITIVE Sensitive     GENTAMICIN <=1 SENSITIVE Sensitive     IMIPENEM <=0.25 SENSITIVE Sensitive     TRIMETH/SULFA <=20 SENSITIVE Sensitive     AMPICILLIN/SULBACTAM 4 SENSITIVE Sensitive     PIP/TAZO <=4 SENSITIVE Sensitive     * KLEBSIELLA OXYTOCA  Culture, blood (x 2)     Status: None   Collection Time: 06/16/15  4:40 PM  Result Value Ref Range Status   Specimen Description BLOOD LEFT ARM  Final   Special Requests IN PEDIATRIC BOTTLE 0 .5CC   Final   Culture   Final    NO GROWTH 5 DAYS Performed at [facility]    Report Status 06/21/2015 FINAL  Final  Wound culture     Status: None   Collection Time: 06/16/15  5:40 PM  Result Value Ref Range Status  Specimen Description SACRAL  Final   Special Requests NONE  Final   Gram Stain   Final    NO WBC SEEN NO SQUAMOUS EPITHELIAL CELLS SEEN RARE GRAM POSITIVE COCCI IN PAIRS IN CLUSTERS Performed at [facility]    Culture   Final    FEW STAPHYLOCOCCUS AUREUS Note: RIFAMPIN AND GENTAMICIN SHOULD NOT BE USED AS SINGLE DRUGS FOR TREATMENT OF STAPH INFECTIONS. Performed at [facility]    Report Status 06/19/2015 FINAL  Final   Organism ID, Bacteria STAPHYLOCOCCUS AUREUS  Final      Susceptibility   Staphylococcus aureus - MIC*    CLINDAMYCIN <=0.25 SENSITIVE Sensitive     ERYTHROMYCIN <=0.25 SENSITIVE Sensitive     GENTAMICIN <=0.5 SENSITIVE Sensitive     LEVOFLOXACIN <=0.12 SENSITIVE Sensitive     OXACILLIN 0.5 SENSITIVE Sensitive     RIFAMPIN <=0.5 SENSITIVE Sensitive     TRIMETH/SULFA <=10 SENSITIVE Sensitive     VANCOMYCIN <=0.5 SENSITIVE Sensitive     TETRACYCLINE >=16 RESISTANT Resistant     MOXIFLOXACIN <=0.25 SENSITIVE Sensitive     * FEW STAPHYLOCOCCUS AUREUS  Wound culture     Status: None   Collection Time: 06/16/15  5:42 PM  Result Value Ref Range Status   Specimen Description FOOT RIGHT  Final   Special Requests NONE  Final   Gram Stain   Final    FEW WBC PRESENT,BOTH PMN AND MONONUCLEAR FEW SQUAMOUS EPITHELIAL CELLS PRESENT MODERATE GRAM NEGATIVE RODS FEW GRAM POSITIVE COCCI IN PAIRS Performed at [facility]    Culture   Final    ABUNDANT STAPHYLOCOCCUS AUREUS Note: RIFAMPIN AND GENTAMICIN SHOULD NOT BE USED AS SINGLE DRUGS FOR TREATMENT OF STAPH INFECTIONS. Performed at [facility]    Report Status 06/21/2015 FINAL  Final   Organism ID, Bacteria STAPHYLOCOCCUS AUREUS  Final      Susceptibility    Staphylococcus aureus - MIC*    CLINDAMYCIN <=0.25 SENSITIVE Sensitive     ERYTHROMYCIN <=0.25 SENSITIVE Sensitive     GENTAMICIN <=0.5 SENSITIVE Sensitive     LEVOFLOXACIN 0.25 SENSITIVE Sensitive     OXACILLIN 0.5 SENSITIVE Sensitive     RIFAMPIN <=0.5 SENSITIVE Sensitive     TRIMETH/SULFA <=10 SENSITIVE Sensitive     VANCOMYCIN <=0.5 SENSITIVE Sensitive     TETRACYCLINE >=16 RESISTANT Resistant     MOXIFLOXACIN <=0.25 SENSITIVE Sensitive     * ABUNDANT STAPHYLOCOCCUS AUREUS  MRSA PCR Screening     Status: None   Collection Time: 06/17/15 10:10 AM  Result Value Ref Range Status   MRSA by PCR NEGATIVE NEGATIVE Final    Comment:        The GeneXpert MRSA Assay (FDA approved for NASAL specimens only), is one component of a comprehensive MRSA colonization surveillance program. It is not intended to diagnose MRSA infection nor to guide or monitor treatment for MRSA infections.   Culture, Urine     Status: None   Collection Time: 06/17/15 11:53 AM  Result Value Ref Range Status   Specimen Description URINE, RANDOM  Final   Special Requests NONE  Final   Culture   Final    >=100,000 COLONIES/mL GROUP B STREP(S.AGALACTIAE)ISOLATED TESTING AGAINST S. AGALACTIAE NOT ROUTINELY PERFORMED DUE TO PREDICTABILITY OF AMP/PEN/VAN SUSCEPTIBILITY. Performed at [facility]    Report Status 06/19/2015 FINAL  Final  Culture, routine-abscess     Status: None   Collection Time: 06/17/15  1:16 PM  Result Value Ref Range  Status   Specimen Description ABSCESS RIGHT PARASPINAL LUMBAR UNDER CT  Final   Special Requests Normal  Final   Gram Stain   Final    ABUNDANT WBC PRESENT, PREDOMINANTLY PMN NO SQUAMOUS EPITHELIAL CELLS SEEN NO ORGANISMS SEEN Performed at [facility]    Culture   Final    MULTIPLE ORGANISMS PRESENT, NONE PREDOMINANT Note: NO STAPHYLOCOCCUS AUREUS ISOLATED NO GROUP A STREP (S.PYOGENES) ISOLATED Performed at [facility]    Report Status  06/20/2015 FINAL  Final  C difficile quick scan w PCR reflex     Status: None   Collection Time: 06/19/15  7:47 PM  Result Value Ref Range Status   C Diff antigen NEGATIVE NEGATIVE Final   C Diff toxin NEGATIVE NEGATIVE Final   C Diff interpretation Negative for toxigenic C. difficile  Final     Scheduled Meds: . diphenhydrAMINE  50 mg Oral Once  . enoxaparin (LOVENOX) injection  1 mg/kg Subcutaneous Q24H  . feeding supplement (PRO-STAT SUGAR FREE 64)  30 mL Oral BID  . gabapentin  100 mg Oral QHS  . imipenem-cilastatin  250 mg Intravenous 3 times per day  . loperamide  2 mg Oral BID  . metoprolol tartrate  12.5 mg Oral BID  . pantoprazole  40 mg Oral Daily  . sodium bicarbonate  650 mg Oral BID  . sodium chloride  10-40 mL Intracatheter Q12H  . sodium chloride  3 mL Intravenous Q12H   Continuous Infusions: . sodium chloride 10 mL/hr at 06/23/15 1013

## 2015-06-26 DIAGNOSIS — L0291 Cutaneous abscess, unspecified: Secondary | ICD-10-CM | POA: Diagnosis present

## 2015-06-26 LAB — BASIC METABOLIC PANEL
Anion gap: 8 (ref 5–15)
BUN: 46 mg/dL — ABNORMAL HIGH (ref 6–20)
CHLORIDE: 109 mmol/L (ref 101–111)
CO2: 20 mmol/L — AB (ref 22–32)
CREATININE: 1.72 mg/dL — AB (ref 0.44–1.00)
Calcium: 7.7 mg/dL — ABNORMAL LOW (ref 8.9–10.3)
GFR calc non Af Amer: 27 mL/min — ABNORMAL LOW (ref >60)
GFR, EST AFRICAN AMERICAN: 31 mL/min — AB (ref >60)
Glucose, Bld: 91 mg/dL (ref 65–99)
POTASSIUM: 4 mmol/L (ref 3.5–5.1)
Sodium: 137 mmol/L (ref 135–145)

## 2015-06-26 LAB — CBC
HEMATOCRIT: 23.9 % — AB (ref 36.0–46.0)
HEMOGLOBIN: 7.8 g/dL — AB (ref 12.0–15.0)
MCH: 28.4 pg (ref 26.0–34.0)
MCHC: 32.6 g/dL (ref 30.0–36.0)
MCV: 86.9 fL (ref 78.0–100.0)
Platelets: 158 10*3/uL (ref 150–400)
RBC: 2.75 MIL/uL — AB (ref 3.87–5.11)
RDW: 15.6 % — ABNORMAL HIGH (ref 11.5–15.5)
WBC: 6.7 10*3/uL (ref 4.0–10.5)

## 2015-06-26 MED ORDER — ACETAMINOPHEN 325 MG PO TABS
650.0000 mg | ORAL_TABLET | Freq: Four times a day (QID) | ORAL | Status: DC | PRN
  Administered 2015-06-26 – 2015-07-02 (×7): 650 mg via ORAL
  Filled 2015-06-26 (×7): qty 2

## 2015-06-26 MED ORDER — SODIUM CHLORIDE 0.9 % IV SOLN
500.0000 mg | INTRAVENOUS | Status: DC
  Administered 2015-06-26 – 2015-07-03 (×8): 0.5 g via INTRAVENOUS
  Filled 2015-06-26 (×8): qty 0.5

## 2015-06-26 NOTE — Care Management Important Message (Signed)
Important Message Important Message  Patient Details IM Letter given toImportant Message  Patient Details IM Letter given to Kathy/Case Manager to present to Patient Name: RILEYANN FLORANCE MRN: [id number] Date of Birth: 1933-09-23   Medicare Important Message Given:  Yes    Camillo Flaming 06/26/2015, 2:30 PM Name: ATLANTIS DELONG MRN: [id number] Date of Birth: 1933-07-28   Medicare Important Message Given:  Yes    Camillo Flaming 06/26/2015, 2:29 PM Patient Details  Name: KASAUNDRA FAHRNEY MRN: [id number] Date of Birth: [birth year]   Medicare Important Message Given:  Yes    Camillo Flaming 06/26/2015, 2:29 PM

## 2015-06-26 NOTE — Progress Notes (Addendum)
Patient ID: [name], female   DOB: 06-09-1933, 82 y.o.   MRN: [id number]  [facility] PROGRESS NOTE  [name] CZY:[id number] DOB: 05-22-1934 DOA: 06/16/2015 PCP: Wende Neighbors, MD   Brief narrative:    Pt is 82 yo female with HT, CAD s/p stent in 2007, h/o chronic systolic chf, not a cnadidate for ace/arb due to ckd IV, she has been taken off blood pressure meds in 03/2015 due to progressive weakness and low bp, h/o stage IV NSCLC initially diagnosed in 2007, with disease recurrent in 11/2014, s/p XRT to right paraspinous mass with osseous invasion at L5 , finished on 12/23/2014, she was then treated with systemic chemo with [place] and paclitaxel which was stopped after two cycles due to intolerance, and mild disease progression, she is currently under immunotherapy with PD1 inhibitor, last on 12/23. She was sent from oncology clinic for evaluation of hypothermia and hypotension, diarrhea for the last two weeks.  Assessment/Plan:    Hypothermia/hypotension/sepsis - present on admission 1/13 with multiple possible source including sacral wound, right hip wound, paraspinal abscess, UTI - polymicrobial paraspinal abscess, s/p aspiration, wound colonized with MSSA, per ID recommend to treat for total 21 days, today is day #12 out of 21 days, can be discharged on primaxin daily dosing  - GBS UTI, GBS ruled out . Sepsis order set placed, culture obtained, empirically start on vanc/aztreonam, IV vanc d/c 1/16,  - ABX changed to Primaxin per ID  - appreciate assistance of ID team  - per ID, portacath may also be seeded with klebsiella and enterobacter, though only 1 of 2 sets +. - Also  recommendation is to repeat blood cx off of abtx to see if need to pull portacath.  - pt tolerating well so far  Paraspinal fluids collection/abscess - CT guided aspiration of the paraspinal fluids on 1/14, ABX as noted above per ID team  Sacral wound: chronic, stage IV present prior to this admission   - per daughter, patient was recently released from the wound care center, has been getting wound care by home health RN.  - per surgery, Dr. Hassell Done on 1/15, continue conservative management for now  Right leg wound - right leg xray with extensive soft tissue swelling, edema vs infection, marked osteropenia? Osseous metastases - continue with wound care  - Lower extremity venous US + acute DVT - keep extremity elevated   Diarrhea - imodium as needed as it is helping, less diarrhea overnight   Bilateral pleural effusion - echocardiogram lvef 50-55% with grade I diastolic dysfunction - volume status stable   Acute on chronic kidney disease, stage III/IV with history of right sided hydronephrosis - s/p percutaneous nephrostomy tube,in 11/2014 which later removed  - Cr overall stable  - BMP in AM  Bilateral pedal edema - right lower extremity edema, H/o bilateral lower extremity DVT: was on lovenox , per daughter lovenox discontinued  Acute DVT - right common femoral vein, right femoral vein, right profunda femoral vein, right popliteal vein, and right proximal peroneal veins. - heparin drip vs ivc filter? Patient was on lovenox for previous DVt, but was taken off lovenox two months ago for unclear reason. - 1/14 started heparin drip for now, monitor hgb and sign.  - Primary hematology/oncologist Dr Julien Nordmann consulted, he recommended lovenox - continue Lovenox per pharmacy   Stage IV lung cancer, metastatic  - Dr Julien Nordmann consulted, appreciate recommendations, pt will have outpatient follow up  Anemia of chronic disease, malignancy  -  Hg stable in the past 24 hours  - CBC in AM  Hypokalemia from diarrhea  - supplemented and remains WNL   CAD s/p stent in 2007 - h/o chronic systolic chf: echo pending  HTN, essential  - has been taken off bp meds since 03/2015 due to progressive weakness and low bp - started low dose lopressor on 1/15 - reasonable inpatient control for now    Report new onset of afib  - on night of 1/16 to 1/17 - on heparin drip, continue betablocker, keep k>4, mag >2.  - transition to Lovenox   Moderate to Severe malnutrition - bedbound status, very low amount of subc fat on CT scan, recent weight loss, nutrition consulted   DVT prophylaxis - on heparin drip   Code Status: Full.  Family Communication:  plan of care discussed with the patient Disposition Plan: Home by 1/24  IV access:  Peripheral IV  Procedures and diagnostic studies:    Ct Abdomen Pelvis Chest Wo Contrast 06/14/2015  New small to moderate bilateral effusions. Associated collapse/consolidation in the right lower lobe, possibly due to pneumonia. Underlying disease recurrence cannot be excluded. 2. Spiculated left upper lobe nodule is stable to minimally decreased in size. Additional scattered pulmonary nodules in the right lung are better seen on the prior exam. 3. Stable osseous metastatic disease. 4. Thick walled collection of fluid and air in the paraspinous musculature overlying the right aspect of the lumbar spine, largely new from 03/29/2015. Finding may be due to an abscess. Please correlate clinically. 5. Heterogeneous low-attenuation lesion in the upper pole right kidney, stable but indeterminate. 6. Ascending aortic aneurysm. Small suprarenal abdominal aortic aneurysm.   Ct Aspiration 06/17/2015 Technically successful CT-guided aspiration of right paraspinal collection. E  Dg Chest Port 1 View 06/16/2015  No significant change when compared to the recent chest CT. 2. Bilateral pleural effusions with associated lung base atelectasis. No convincing pneumonia or edema. 1 cm left upper lobe spiculated nodule.   Dg Foot 2 Views Right 06/16/2015 Moderate osteopenia with diffuse heterogeneity of the marrow. This likely represents osseous metastases. Similar appearance could be seen with severe osteopenia. 2. Extensive soft tissue swelling. This may represent edema or  infection. There is no underlying bone destruction.   Medical Consultants:  Oncology Dr Benay Spice ( over the phone on 1/13) , Dr Alen Blew on 1/14, primary oncologist Dr. Julien Nordmann notified on Monday 1/16. IR for paraspinous fluids collection drain ( talked To Dr. Kathlene Cote) General surgery Dr Hassell Done on 1/15, please see detail above. Infectious disease   Faye Ramsay, MD  [facility] Pager [phone]  If 7PM-7AM, please contact night-coverage [email or web address] Password [email or web address] 06/26/2015, 2:00 PM   LOS: 10 days   HPI/Subjective: No events overnight.   Objective: Filed Vitals:   06/25/15 0445 06/25/15 1301 06/25/15 2129 06/26/15 0456  BP: 111/52 108/44 102/58 100/53  Pulse: 88 83 97 84  Temp: 97.8 F (36.6 C) 97.8 F (36.6 C) 98.3 F (36.8 C) 97.8 F (36.6 C)  TempSrc: Oral Oral Oral Oral  Resp:  '18 20 18  '$ Height:      Weight:      SpO2: 97% 98% 96% 94%    Intake/Output Summary (Last 24 hours) at 06/26/15 1400 Last data filed at 06/26/15 1036  Gross per 24 hour  Intake    120 ml  Output   1225 ml  Net  -1105 ml    Exam:   General:  Pt is alert, follows commands appropriately, not in acute  distress  Cardiovascular: Irregular rate and rhythm, no rubs, no gallops  Respiratory: Clear to auscultation bilaterally, no wheezing, no crackles, no rhonchi  Data Reviewed: Basic Metabolic Panel:  Recent Labs Lab 06/22/15 0445 06/23/15 0450 06/24/15 0545 06/25/15 0430 06/26/15 0337  NA 141 141 139 138 137  K 3.5 3.5 3.1* 4.1 4.0  CL 112* 113* 111 111 109  CO2 20* 22 21* 21* 20*  GLUCOSE 67 74 68 84 91  BUN 42* 45* 44* 46* 46*  CREATININE 1.70* 1.67* 1.79* 1.86* 1.72*  CALCIUM 7.6* 7.6* 7.6* 7.7* 7.7*   CBC:  Recent Labs Lab 06/22/15 0445 06/23/15 0450 06/24/15 0545 06/25/15 0430 06/26/15 0337  WBC 5.6 5.9 6.1 7.5 6.7  HGB 8.6* 8.2* 8.0* 7.9* 7.8*  HCT 27.5* 26.2* 25.2* 25.3* 23.9*  MCV 89.0 89.1 86.6 89.7 86.9  PLT 161 155 155 157 158   Cardiac  Enzymes:  Recent Labs Lab 06/20/15 0515  TROPONINI <0.03   Recent Results (from the past 240 hour(s))  Culture, blood (x 2)     Status: None   Collection Time: 06/16/15  4:25 PM  Result Value Ref Range Status   Specimen Description BLOOD RIGHT ARM  Final   Special Requests BOTTLES DRAWN AEROBIC AND ANAEROBIC  5CC, 4CC  Final   Culture  Setup Time   Final    GRAM NEGATIVE RODS IN BOTH AEROBIC AND ANAEROBIC BOTTLES CRITICAL RESULT CALLED TO, READ BACK BY AND VERIFIED WITH: Windell Norfolk AT [id number] ON [id number] BY Rhea Bleacher    Culture   Final    KLEBSIELLA OXYTOCA ENTEROBACTER CLOACAE Performed at [facility]    Report Status 06/19/2015 FINAL  Final   Organism ID, Bacteria KLEBSIELLA OXYTOCA  Final   Organism ID, Bacteria ENTEROBACTER CLOACAE  Final      Susceptibility   Enterobacter cloacae - MIC*    CEFAZOLIN >=64 RESISTANT Resistant     CEFEPIME <=1 SENSITIVE Sensitive     CEFTAZIDIME <=1 SENSITIVE Sensitive     CEFTRIAXONE <=1 SENSITIVE Sensitive     CIPROFLOXACIN <=0.25 SENSITIVE Sensitive     GENTAMICIN <=1 SENSITIVE Sensitive     IMIPENEM 0.5 SENSITIVE Sensitive     TRIMETH/SULFA >=320 RESISTANT Resistant     PIP/TAZO <=4 SENSITIVE Sensitive     * ENTEROBACTER CLOACAE   Klebsiella oxytoca - MIC*    AMPICILLIN >=32 RESISTANT Resistant     CEFAZOLIN >=64 RESISTANT Resistant     CEFEPIME <=1 SENSITIVE Sensitive     CEFTAZIDIME <=1 SENSITIVE Sensitive     CEFTRIAXONE <=1 SENSITIVE Sensitive     CIPROFLOXACIN <=0.25 SENSITIVE Sensitive     GENTAMICIN <=1 SENSITIVE Sensitive     IMIPENEM <=0.25 SENSITIVE Sensitive     TRIMETH/SULFA <=20 SENSITIVE Sensitive     AMPICILLIN/SULBACTAM 4 SENSITIVE Sensitive     PIP/TAZO <=4 SENSITIVE Sensitive     * KLEBSIELLA OXYTOCA  Culture, blood (x 2)     Status: None   Collection Time: 06/16/15  4:40 PM  Result Value Ref Range Status   Specimen Description BLOOD LEFT ARM  Final   Special Requests IN PEDIATRIC BOTTLE 0 .5CC   Final   Culture   Final    NO GROWTH 5 DAYS Performed at [facility]    Report Status 06/21/2015 FINAL  Final  Wound culture     Status: None   Collection Time: 06/16/15  5:40 PM  Result Value Ref Range Status   Specimen Description SACRAL  Final   Special Requests NONE  Final   Gram Stain   Final    NO WBC SEEN NO SQUAMOUS EPITHELIAL CELLS SEEN RARE GRAM POSITIVE COCCI IN PAIRS IN CLUSTERS Performed at [facility]    Culture   Final    FEW STAPHYLOCOCCUS AUREUS Note: RIFAMPIN AND GENTAMICIN SHOULD NOT BE USED AS SINGLE DRUGS FOR TREATMENT OF STAPH INFECTIONS. Performed at [facility]    Report Status 06/19/2015 FINAL  Final   Organism ID, Bacteria STAPHYLOCOCCUS AUREUS  Final      Susceptibility   Staphylococcus aureus - MIC*    CLINDAMYCIN <=0.25 SENSITIVE Sensitive     ERYTHROMYCIN <=0.25 SENSITIVE Sensitive     GENTAMICIN <=0.5 SENSITIVE Sensitive     LEVOFLOXACIN <=0.12 SENSITIVE Sensitive     OXACILLIN 0.5 SENSITIVE Sensitive     RIFAMPIN <=0.5 SENSITIVE Sensitive     TRIMETH/SULFA <=10 SENSITIVE Sensitive     VANCOMYCIN <=0.5 SENSITIVE Sensitive     TETRACYCLINE >=16 RESISTANT Resistant     MOXIFLOXACIN <=0.25 SENSITIVE Sensitive     * FEW STAPHYLOCOCCUS AUREUS  Wound culture     Status: None   Collection Time: 06/16/15  5:42 PM  Result Value Ref Range Status   Specimen Description FOOT RIGHT  Final   Special Requests NONE  Final   Gram Stain   Final    FEW WBC PRESENT,BOTH PMN AND MONONUCLEAR FEW SQUAMOUS EPITHELIAL CELLS PRESENT MODERATE GRAM NEGATIVE RODS FEW GRAM POSITIVE COCCI IN PAIRS Performed at [facility]    Culture   Final    ABUNDANT STAPHYLOCOCCUS AUREUS Note: RIFAMPIN AND GENTAMICIN SHOULD NOT BE USED AS SINGLE DRUGS FOR TREATMENT OF STAPH INFECTIONS. Performed at [facility]    Report Status 06/21/2015 FINAL  Final   Organism ID, Bacteria STAPHYLOCOCCUS AUREUS  Final      Susceptibility    Staphylococcus aureus - MIC*    CLINDAMYCIN <=0.25 SENSITIVE Sensitive     ERYTHROMYCIN <=0.25 SENSITIVE Sensitive     GENTAMICIN <=0.5 SENSITIVE Sensitive     LEVOFLOXACIN 0.25 SENSITIVE Sensitive     OXACILLIN 0.5 SENSITIVE Sensitive     RIFAMPIN <=0.5 SENSITIVE Sensitive     TRIMETH/SULFA <=10 SENSITIVE Sensitive     VANCOMYCIN <=0.5 SENSITIVE Sensitive     TETRACYCLINE >=16 RESISTANT Resistant     MOXIFLOXACIN <=0.25 SENSITIVE Sensitive     * ABUNDANT STAPHYLOCOCCUS AUREUS  MRSA PCR Screening     Status: None   Collection Time: 06/17/15 10:10 AM  Result Value Ref Range Status   MRSA by PCR NEGATIVE NEGATIVE Final    Comment:        The GeneXpert MRSA Assay (FDA approved for NASAL specimens only), is one component of a comprehensive MRSA colonization surveillance program. It is not intended to diagnose MRSA infection nor to guide or monitor treatment for MRSA infections.   Culture, Urine     Status: None   Collection Time: 06/17/15 11:53 AM  Result Value Ref Range Status   Specimen Description URINE, RANDOM  Final   Special Requests NONE  Final   Culture   Final    >=100,000 COLONIES/mL GROUP B STREP(S.AGALACTIAE)ISOLATED TESTING AGAINST S. AGALACTIAE NOT ROUTINELY PERFORMED DUE TO PREDICTABILITY OF AMP/PEN/VAN SUSCEPTIBILITY. Performed at [facility]    Report Status 06/19/2015 FINAL  Final  Culture, routine-abscess     Status: None   Collection Time: 06/17/15  1:16 PM  Result Value Ref Range Status   Specimen  Description ABSCESS RIGHT PARASPINAL LUMBAR UNDER CT  Final   Special Requests Normal  Final   Gram Stain   Final    ABUNDANT WBC PRESENT, PREDOMINANTLY PMN NO SQUAMOUS EPITHELIAL CELLS SEEN NO ORGANISMS SEEN Performed at [facility]    Culture   Final    MULTIPLE ORGANISMS PRESENT, NONE PREDOMINANT Note: NO STAPHYLOCOCCUS AUREUS ISOLATED NO GROUP A STREP (S.PYOGENES) ISOLATED Performed at [facility]    Report Status  06/20/2015 FINAL  Final  C difficile quick scan w PCR reflex     Status: None   Collection Time: 06/19/15  7:47 PM  Result Value Ref Range Status   C Diff antigen NEGATIVE NEGATIVE Final   C Diff toxin NEGATIVE NEGATIVE Final   C Diff interpretation Negative for toxigenic C. difficile  Final     Scheduled Meds: . diphenhydrAMINE  50 mg Oral Once  . enoxaparin (LOVENOX) injection  1 mg/kg Subcutaneous Q24H  . ertapenem  500 mg Intravenous Q24H  . feeding supplement (PRO-STAT SUGAR FREE 64)  30 mL Oral BID  . gabapentin  100 mg Oral QHS  . loperamide  2 mg Oral BID  . metoprolol tartrate  12.5 mg Oral BID  . pantoprazole  40 mg Oral Daily  . sodium bicarbonate  650 mg Oral BID  . sodium chloride  10-40 mL Intracatheter Q12H  . sodium chloride  3 mL Intravenous Q12H   Continuous Infusions: . sodium chloride 10 mL/hr at 06/23/15 1013

## 2015-06-27 ENCOUNTER — Inpatient Hospital Stay (HOSPITAL_COMMUNITY): Payer: Medicare Other

## 2015-06-27 DIAGNOSIS — L97519 Non-pressure chronic ulcer of other part of right foot with unspecified severity: Secondary | ICD-10-CM

## 2015-06-27 DIAGNOSIS — K591 Functional diarrhea: Secondary | ICD-10-CM

## 2015-06-27 LAB — BASIC METABOLIC PANEL
ANION GAP: 6 (ref 5–15)
BUN: 51 mg/dL — ABNORMAL HIGH (ref 6–20)
CO2: 21 mmol/L — AB (ref 22–32)
Calcium: 7.9 mg/dL — ABNORMAL LOW (ref 8.9–10.3)
Chloride: 110 mmol/L (ref 101–111)
Creatinine, Ser: 1.92 mg/dL — ABNORMAL HIGH (ref 0.44–1.00)
GFR calc Af Amer: 27 mL/min — ABNORMAL LOW (ref >60)
GFR calc non Af Amer: 24 mL/min — ABNORMAL LOW (ref >60)
GLUCOSE: 86 mg/dL (ref 65–99)
POTASSIUM: 4.3 mmol/L (ref 3.5–5.1)
Sodium: 137 mmol/L (ref 135–145)

## 2015-06-27 LAB — CBC
HEMATOCRIT: 26.2 % — AB (ref 36.0–46.0)
Hemoglobin: 8.3 g/dL — ABNORMAL LOW (ref 12.0–15.0)
MCH: 28 pg (ref 26.0–34.0)
MCHC: 31.7 g/dL (ref 30.0–36.0)
MCV: 88.5 fL (ref 78.0–100.0)
Platelets: 202 10*3/uL (ref 150–400)
RBC: 2.96 MIL/uL — AB (ref 3.87–5.11)
RDW: 16.1 % — ABNORMAL HIGH (ref 11.5–15.5)
WBC: 7.3 10*3/uL (ref 4.0–10.5)

## 2015-06-27 MED ORDER — KETOROLAC TROMETHAMINE 30 MG/ML IJ SOLN: 30.0000 mg | Freq: Four times a day (QID) | INTRAMUSCULAR | Status: DC | PRN

## 2015-06-27 NOTE — Progress Notes (Signed)
[facility] for Infectious Disease    Date of Admission:  06/16/2015   Total days of antibiotics 12        Day 2 ertapenem           ID: [name] is a 82 y.o. female with  HTN, CKD, CAD, GERD, and stage IV NSCLC involving lumbar spine, debilitated who has sacral, right hip, and left heel pressure ulcers presents with sepsis (hypotension, hypothermia) found to have polymicrobial bacteremia in setting of 2 wk fo watery diarrhea as well as polymicrobial paraspinal abscess s/p aspiration. Wound as colonized with MSSA. +/- GBS uti. She has portacath but only had 1 of 2 blood cx with enterobacter/klebsiella. Also found to have DVT noted in the right common femoral vein, right femoral vein, right profunda femoral vein, right popliteal vein, and right proximal peroneal veins. Principal Problem:   Sepsis ([place]) Active Problems:   Pressure ulcer   Lung cancer stage IV with Metastatic squamous cell carcinoma to bone (HCC)   Hypotension   Acute renal failure superimposed on stage 3 chronic kidney disease (HCC)   Anemia of chronic disease   Hypokalemia   Hypothermia   Paraspinal abscess (HCC)   Malnutrition of moderate degree   Edema   Right leg DVT (HCC)   Diarrhea   Abscess    Subjective: Feeling better, only had 3 semiformed BM. Wound healing better per daughter report. Discussed that they do not have specialized air mattress for hte patient at home. Only simple hospital bed  Medications:  . diphenhydrAMINE  50 mg Oral Once  . enoxaparin (LOVENOX) injection  1 mg/kg Subcutaneous Q24H  . ertapenem  500 mg Intravenous Q24H  . feeding supplement (PRO-STAT SUGAR FREE 64)  30 mL Oral BID  . gabapentin  100 mg Oral QHS  . loperamide  2 mg Oral BID  . metoprolol tartrate  12.5 mg Oral BID  . pantoprazole  40 mg Oral Daily  . sodium bicarbonate  650 mg Oral BID  . sodium chloride  10-40 mL Intracatheter Q12H  . sodium chloride  3 mL Intravenous Q12H    Objective: Vital signs  in last 24 hours: Temp:  [97.8 F (36.6 C)] 97.8 F (36.6 C) (01/23 2219) Pulse Rate:  [86] 86 (01/23 2219) Resp:  [18] 18 (01/23 2219) BP: (112)/(53) 112/53 mmHg (01/23 2219) SpO2:  [98 %] 98 % (01/23 2219) Constitutional: oriented to person, place, appears frail, age appropriate, thin/emaciate. No distress.  HENT: [place]/AT, PERRLA, no scleral icterus Mouth/Throat: Oropharynx is clear and moist. No oropharyngeal exudate.  Cardiovascular: Normal rate, regular rhythm and normal heart sounds. Exam reveals no gallop and no friction rub.  No murmur heard.  Pulmonary/Chest: Effort normal and breath sounds normal. No respiratory distress. has no wheezes.  Neck = supple, no nuchal rigidity Abdominal: Soft. Bowel sounds are normal. exhibits no distension. There is no tenderness.  Lymphadenopathy: no cervical adenopathy. No axillary adenopathy Neurological: alert and oriented to person, place, and time.  Skin: numerous echymosis throughout torso and extremities with skin tear to left fore arm, left shoulder. Multiple skin tears Sacrum = large wound stage 4 with fibrinous debris at base  Ext: left heel pressure ulcer, as described above. +2 pitting edema due to anasarca. Right leg greater than left (also r leg is + DVT). Psychiatric: a normal mood and affect. behavior is normal.     Lab Results  Recent Labs  06/26/15 0337 06/27/15 0524  WBC 6.7 7.3  HGB 7.8* 8.3*  HCT 23.9* 26.2*  NA 137 137  K 4.0 4.3  CL 109 110  CO2 20* 21*  BUN 46* 51*  CREATININE 1.72* 1.92*   Lab Results  Component Value Date   ESRSEDRATE 17 06/16/2015   Lab Results  Component Value Date   CRP 11.0* 06/16/2015    Assessment/Plan: 82yo F with HTN, CKD, CAD, GERD, and stage IV NSCLC involving lumbar spine, debilitated who has sacral, right hip, and left heel pressure ulcers presents with sepsis (hypotension, hypothermia) found to have polymicrobial bacteremia in setting of 2 wk fo watery  diarrhea as well as polymicrobial paraspinal abscess s/p aspiration. Wound as colonized with MSSA. +/- GBS uti. She has portacath but only had 1 of 2 blood cx with GNR. Complains of ongoing diarrhea  Diarrhea = cdiff is ruled out. Recommend to schedule immodium 2 x per day to see if it helps her symptoms.   Polymicrobial bacteremia with secondary paraspinal abscess = plan to treat for 4 wk. Can discharge on ertapenem for once a day dosing, currently on day 12 of 28 days   her portacath may also be seeded with klebsiella and enterobacter, though only 1 of 2 sets +.  repeat blood cx off of abtx to see if need to pull portacath.   Decub ulcers stage 4 and stage 3 = would need to ensure she has the proper home health to care for wounds, proper air mattress for home hospital bed to help with improvement possibly. She is colonized with MSSA and would get some benefit from tx bacteremia. Wound care is key especially to keep feculant material out of the wound  PLEASE ask wound care team which type of specialized mattress the patient needs for home so that it can be ordered and minimize worsening decub ulcers.  NSCLC = defer to oncology for further managmenet  DVT of right leg = currently on anticoagulation  [name], [facility] for Infectious Diseases Cell: [phone] Pager: [phone]  06/27/2015, 5:11 PM

## 2015-06-27 NOTE — Progress Notes (Signed)
Nutrition Follow-up  DOCUMENTATION CODES:   Non-severe (moderate) malnutrition in context of chronic illness  INTERVENTION:  - Continue Prostat BID - Encourage PO intakes at meals - RD will continue to monitor for needs  NUTRITION DIAGNOSIS:   Increased nutrient needs related to wound healing as evidenced by estimated needs. -ongoing  GOAL:   Patient will meet greater than or equal to 90% of their needs -variably met  MONITOR:   PO intake, Supplement acceptance, Labs, Weight trends, Skin, I & O's  ASSESSMENT:   82 y.o . patient With h/o htn, cad s/o stent in 2007, h/o chronic systolic chf, not a cnadidate for ace/arb due to ckd IV, she has been taken off blood pressure meds in 03/2015 due to progressive weakness and low bp, h/o stage IV NSCLC initially diagnosed in 2007, with disease recurrent in 11/2014, s/p XRT to right paraspinous mass with osseous invasion at l5 , finished on 12/23/2014, she was then treated with systemic chemo with [place] and paclitaxel which was stopped after two cycles due to intolerance, and mild disease progression, she is currently uncer immunotherapy with PD1 inhibitor, last on 12/23. Today she is sent from oncology clinic Due to hypothermia and hypotension, reported has been having diarrhea for the last two weeks, denies ab pain, no n/v, she has a chronic sacral decubitus ulcer and chronic right lower leg wound which are care by home health RN.   1/24 Per chart review, pt ate 25% of breakfast and 75% of lunch 1/23 and 25% of lunch 1/22. Pt reports that her appetite continues to be good. She states that she ate 2 pieces of toast, a bowl of grits, and attempted to drink coffee this AM. She also received a hard boiled egg during this meal which she is saving for later. Pt states that the Prostat is "okay" but that she does take it each time it is given. She states she likes Ensure very much but that it causes her to have diarrhea so she would like to stick with  Prostat for now. Pt denies any abdominal discomfort or nausea with PO intakes.   Pt variably meeting needs. Medications reviewed. Labs reviewed; BUN/creatinine elevated and trending up, Ca: 7.9 mg/dL, GFR: 24.   1/19 - Per chart review, pt ate 25% of breakfast this AM, 75% of breakfast and 20% of lunch yesterday (1/18), and 100% of breakfast and 25% of both lunch and dinner 1/17. - Pt reports that her appetite is improving and that she is eating better.  - For breakfast this AM she had 2 hardboiled eggs and a slice of [place] toast. - Pt states some abdominal pain after meals yesterday but otherwise no abdominal discomfort or nausea with PO intakes.  1/15 - Pt in room with no family present. - Per pt, she has been eating better than previous admission in October. - Pt ate all of her omelette this morning. - Pt does have multiple wounds and is still receiving cancer treatments which increases her needs. - Pt has gained back some weight over the last couple of months (unsure if this is d/t her improved PO or fluid gain). - Pt reports not tolerating Ensure supplements which she does like but it causes loose stools.  - She is willing to try Prostat supplements. - Nutrition-Focused physical exam completed.  - Findings are mild fat depletion, moderate muscle depletion, and moderate-severe edema. - Edema may be masking some depletion in extremities.   Diet Order:  Diet regular Room  service appropriate?: Yes; Fluid consistency:: Thin  Skin:  Wound (see comment) (Stg IV sacral ulcer, Stg III rt hip ulcer, deep tissue injury on ankle)  Last BM:  1/23  Height:   Ht Readings from Last 1 Encounters:  06/16/15 _0  (1.727 m)    Weight:   Wt Readings from Last 1 Encounters:  06/16/15 132 lb 4.4 oz (60 kg)    Ideal Body Weight:  63.6 kg  BMI:  Body mass index is 20.12 kg/(m^2).  Estimated Nutritional Needs:   Kcal:  1800-2000  Protein:  85-95g  Fluid:  2L/day  EDUCATION NEEDS:    Education needs addressed     Jarome Matin, RD, LDN Inpatient Clinical Dietitian Pager # [phone] After hours/weekend pager # [phone]

## 2015-06-27 NOTE — Progress Notes (Addendum)
Patient ID: [name], female   DOB: 07-Jan-1934, 82 y.o.   MRN: [id number]  [facility] PROGRESS NOTE  [name] FGH:[id number] DOB: 1933/12/09 DOA: 06/16/2015 PCP: Wende Neighbors, MD   Brief narrative:    Pt is 82 yo female with HT, CAD s/p stent in 2007, h/o chronic systolic chf, not a cnadidate for ace/arb due to ckd IV, she has been taken off blood pressure meds in 03/2015 due to progressive weakness and low bp, h/o stage IV NSCLC initially diagnosed in 2007, with disease recurrent in 11/2014, s/p XRT to right paraspinous mass with osseous invasion at L5 , finished on 12/23/2014, she was then treated with systemic chemo with [place] and paclitaxel which was stopped after two cycles due to intolerance, and mild disease progression, she is currently under immunotherapy with PD1 inhibitor, last on 12/23. She was sent from oncology clinic for evaluation of hypothermia and hypotension, diarrhea for the last two weeks.  Assessment/Plan:    Hypothermia/hypotension/sepsis - present on admission 1/13 with multiple possible source including sacral wound, right hip wound, paraspinal abscess, UTI - polymicrobial paraspinal abscess, s/p aspiration, wound colonized with MSSA, per ID recommend to treat for total 21 days, today is day #13 out of 21 days, can be discharged on primaxin daily dosing  - appreciate assistance of ID team  - per ID, portacath may also be seeded with klebsiella and enterobacter, though only 1 of 2 sets + - Also  recommendation is to repeat blood cx off of abtx to see if need to pull portacath.  - pt tolerating well so far  Paraspinal fluids collection/abscess - CT guided aspiration of the paraspinal fluids on 1/14, ABX as noted above per ID team  Sacral wound: chronic, stage IV present prior to this admission  - per daughter, patient was recently released from the wound care center, has been getting wound care by home health RN.  - per surgery, Dr. Hassell Done on 1/15,  continue conservative management for now - will check on air matress  Right leg wound - right leg xray with extensive soft tissue swelling, edema vs infection, marked osteropenia? Osseous metastases - continue with wound care  - Lower extremity venous US + acute DVT - keep extremity elevated   Diarrhea - imodium as needed as it is helping, less diarrhea overnight   Bilateral pleural effusion - echocardiogram lvef 50-55% with grade I diastolic dysfunction - volume status stable   Acute on chronic kidney disease, stage III/IV with history of right sided hydronephrosis - s/p percutaneous nephrostomy tube,in 11/2014 which later removed  - Cr overall stable  - BMP in AM  Bilateral pedal edema - right lower extremity edema, H/o bilateral lower extremity DVT: on AC   Acute DVT - right common femoral vein, right femoral vein, right profunda femoral vein, right popliteal vein, and right proximal peroneal veins. - heparin drip vs ivc filter? Patient was on lovenox for previous DVt, but was taken off lovenox two months ago for unclear reason. - 1/14 started heparin drip and transitioned to Lovenox per Dr. Julien Nordmann recommendations   Stage IV lung cancer, metastatic  - Dr Julien Nordmann consulted, appreciate recommendations, pt will have outpatient follow up  Anemia of chronic disease, malignancy  - Hg stable in the past 24 hours  - CBC in AM  Hypokalemia from diarrhea  - supplemented and remains WNL   CAD s/p stent in 2007 - h/o chronic systolic chf: echo pending  HTN, essential  - has  been taken off bp meds since 03/2015 due to progressive weakness and low bp - started low dose lopressor on 1/15 - reasonable inpatient control for now   Report new onset of afib  - on night of 1/16 to 1/17 - on heparin drip, continue betablocker, keep k>4, mag >2.  - transitioned to Lovenox   Moderate to Severe malnutrition - bedbound status, very low amount of subc fat on CT scan, recent weight loss,  nutrition consulted   RUE pain and swelling - from the site were IV access was  - XRAY with no acute findings  - allow analgesia as needed, re evaluate in AM  DVT prophylaxis - on heparin drip   Code Status: Full.  Family Communication:  plan of care discussed with the patient Disposition Plan: Home by 1/26  IV access:  Peripheral IV  Procedures and diagnostic studies:    Ct Abdomen Pelvis Chest Wo Contrast 06/14/2015  New small to moderate bilateral effusions. Associated collapse/consolidation in the right lower lobe, possibly due to pneumonia. Underlying disease recurrence cannot be excluded. 2. Spiculated left upper lobe nodule is stable to minimally decreased in size. Additional scattered pulmonary nodules in the right lung are better seen on the prior exam. 3. Stable osseous metastatic disease. 4. Thick walled collection of fluid and air in the paraspinous musculature overlying the right aspect of the lumbar spine, largely new from 03/29/2015. Finding may be due to an abscess. Please correlate clinically. 5. Heterogeneous low-attenuation lesion in the upper pole right kidney, stable but indeterminate. 6. Ascending aortic aneurysm. Small suprarenal abdominal aortic aneurysm.   Ct Aspiration 06/17/2015 Technically successful CT-guided aspiration of right paraspinal collection. E  Dg Chest Port 1 View 06/16/2015  No significant change when compared to the recent chest CT. 2. Bilateral pleural effusions with associated lung base atelectasis. No convincing pneumonia or edema. 1 cm left upper lobe spiculated nodule.   Dg Foot 2 Views Right 06/16/2015 Moderate osteopenia with diffuse heterogeneity of the marrow. This likely represents osseous metastases. Similar appearance could be seen with severe osteopenia. 2. Extensive soft tissue swelling. This may represent edema or infection. There is no underlying bone destruction.   Medical Consultants:  Oncology Dr Benay Spice ( over the phone on 1/13)  , Dr Alen Blew on 1/14, primary oncologist Dr. Julien Nordmann notified on Monday 1/16. IR for paraspinous fluids collection drain ( talked To Dr. Kathlene Cote) General surgery Dr Hassell Done on 1/15, please see detail above. Infectious disease   Faye Ramsay, MD  [facility] Pager [phone]  If 7PM-7AM, please contact night-coverage [email or web address] Password [email or web address] 06/27/2015, 7:23 PM   LOS: 11 days   HPI/Subjective: No events overnight.   Objective: Filed Vitals:   06/25/15 2129 06/26/15 0456 06/26/15 1434 06/26/15 2219  BP: 102/58 100/53 107/44 112/53  Pulse: 97 84 72 86  Temp: 98.3 F (36.8 C) 97.8 F (36.6 C) 97.5 F (36.4 C) 97.8 F (36.6 C)  TempSrc: Oral Oral Oral Oral  Resp: '20 18 18 18  '$ Height:      Weight:      SpO2: 96% 94% 100% 98%    Intake/Output Summary (Last 24 hours) at 06/27/15 1923 Last data filed at 06/27/15 1400  Gross per 24 hour  Intake    490 ml  Output      0 ml  Net    490 ml    Exam:   General:  Pt is alert, follows commands appropriately, not in acute distress  Cardiovascular: Irregular rate and rhythm,  no rubs, no gallops  Respiratory: Clear to auscultation bilaterally, no wheezing, no crackles, no rhonchi  Data Reviewed: Basic Metabolic Panel:  Recent Labs Lab 06/23/15 0450 06/24/15 0545 06/25/15 0430 06/26/15 0337 06/27/15 0524  NA 141 139 138 137 137  K 3.5 3.1* 4.1 4.0 4.3  CL 113* 111 111 109 110  CO2 22 21* 21* 20* 21*  GLUCOSE 74 68 84 91 86  BUN 45* 44* 46* 46* 51*  CREATININE 1.67* 1.79* 1.86* 1.72* 1.92*  CALCIUM 7.6* 7.6* 7.7* 7.7* 7.9*   CBC:  Recent Labs Lab 06/23/15 0450 06/24/15 0545 06/25/15 0430 06/26/15 0337 06/27/15 0524  WBC 5.9 6.1 7.5 6.7 7.3  HGB 8.2* 8.0* 7.9* 7.8* 8.3*  HCT 26.2* 25.2* 25.3* 23.9* 26.2*  MCV 89.1 86.6 89.7 86.9 88.5  PLT 155 155 157 158 202   Cardiac Enzymes: No results for input(s): CKTOTAL, CKMB, CKMBINDEX, TROPONINI in the last 168 hours. Recent Results (from the past 240 hour(s))   C difficile quick scan w PCR reflex     Status: None   Collection Time: 06/19/15  7:47 PM  Result Value Ref Range Status   C Diff antigen NEGATIVE NEGATIVE Final   C Diff toxin NEGATIVE NEGATIVE Final   C Diff interpretation Negative for toxigenic C. difficile  Final     Scheduled Meds: . diphenhydrAMINE  50 mg Oral Once  . enoxaparin (LOVENOX) injection  1 mg/kg Subcutaneous Q24H  . ertapenem  500 mg Intravenous Q24H  . feeding supplement (PRO-STAT SUGAR FREE 64)  30 mL Oral BID  . gabapentin  100 mg Oral QHS  . loperamide  2 mg Oral BID  . metoprolol tartrate  12.5 mg Oral BID  . pantoprazole  40 mg Oral Daily  . sodium bicarbonate  650 mg Oral BID  . sodium chloride  10-40 mL Intracatheter Q12H  . sodium chloride  3 mL Intravenous Q12H   Continuous Infusions: . sodium chloride 10 mL/hr at 06/23/15 1013

## 2015-06-28 LAB — BASIC METABOLIC PANEL
ANION GAP: 6 (ref 5–15)
BUN: 54 mg/dL — ABNORMAL HIGH (ref 6–20)
CALCIUM: 7.4 mg/dL — AB (ref 8.9–10.3)
CHLORIDE: 109 mmol/L (ref 101–111)
CO2: 21 mmol/L — AB (ref 22–32)
Creatinine, Ser: 1.83 mg/dL — ABNORMAL HIGH (ref 0.44–1.00)
GFR calc non Af Amer: 25 mL/min — ABNORMAL LOW (ref >60)
GFR, EST AFRICAN AMERICAN: 29 mL/min — AB (ref >60)
Glucose, Bld: 77 mg/dL (ref 65–99)
Potassium: 3.9 mmol/L (ref 3.5–5.1)
Sodium: 136 mmol/L (ref 135–145)

## 2015-06-28 LAB — CBC
HEMATOCRIT: 24.9 % — AB (ref 36.0–46.0)
HEMOGLOBIN: 7.8 g/dL — AB (ref 12.0–15.0)
MCH: 27.9 pg (ref 26.0–34.0)
MCHC: 31.3 g/dL (ref 30.0–36.0)
MCV: 88.9 fL (ref 78.0–100.0)
Platelets: 209 10*3/uL (ref 150–400)
RBC: 2.8 MIL/uL — ABNORMAL LOW (ref 3.87–5.11)
RDW: 16.4 % — AB (ref 11.5–15.5)
WBC: 6.2 10*3/uL (ref 4.0–10.5)

## 2015-06-28 NOTE — Progress Notes (Addendum)
Patient ID: [name], female   DOB: 1934/01/16, 82 y.o.   MRN: [id number]  [facility] PROGRESS NOTE  GEARLDENE FIORENZA DDU:[id number] DOB: Mar 27, 1934 DOA: 06/16/2015 PCP: Wende Neighbors, MD   Brief narrative:    Pt is 82 yo female with HT, CAD s/p stent in 2007, h/o chronic systolic chf, not a cnadidate for ace/arb due to ckd IV, she has been taken off blood pressure meds in 03/2015 due to progressive weakness and low bp, h/o stage IV NSCLC initially diagnosed in 2007, with disease recurrent in 11/2014, s/p XRT to right paraspinous mass with osseous invasion at L5 , finished on 12/23/2014, she was then treated with systemic chemo with [place] and paclitaxel which was stopped after two cycles due to intolerance, and mild disease progression, she is currently under immunotherapy with PD1 inhibitor, last on 12/23. She was sent from oncology clinic for evaluation of hypothermia and hypotension, diarrhea for the last two weeks.  Assessment/Plan:    Hypothermia/hypotension/sepsis - present on admission 1/13 with multiple possible source including sacral wound, right hip wound, paraspinal abscess, UTI - polymicrobial paraspinal abscess, s/p aspiration, wound colonized with MSSA, per ID recommend to treat for total 21 days, today is day #14 out of 21 days, can be discharged on primaxin daily dosing  - appreciate assistance of ID team  - per ID, portacath may also be seeded with klebsiella and enterobacter, though only 1 of 2 sets + - Also  recommendation is to repeat blood cx off of abtx to see if need to pull portacath.  - pt tolerating well so far  Paraspinal fluids collection/abscess - CT guided aspiration of the paraspinal fluids on 1/14, ABX as noted above per ID team  Sacral wound: chronic, stage IV present prior to this admission  - per daughter, patient was recently released from the wound care center, has been getting wound care by home health RN.  - per surgery, Dr. Hassell Done on 1/15,  continue conservative management for now - will check on air matress  Right leg wound - right leg xray with extensive soft tissue swelling, edema vs infection, marked osteropenia? Osseous metastases - continue with wound care  - Lower extremity venous US + acute DVT - keep extremity elevated   Diarrhea - imodium as needed as it is helping, less diarrhea overnight   Bilateral pleural effusion - echocardiogram lvef 50-55% with grade I diastolic dysfunction - volume status stable   Acute on chronic kidney disease, stage III/IV with history of right sided hydronephrosis - s/p percutaneous nephrostomy tube,in 11/2014 which later removed  - Cr overall trending down - BMP in AM  Bilateral pedal edema - right lower extremity edema, H/o bilateral lower extremity DVT: on AC   Acute DVT - right common femoral vein, right femoral vein, right profunda femoral vein, right popliteal vein, and right proximal peroneal veins. - heparin drip vs ivc filter? Patient was on lovenox for previous DVt, but was taken off lovenox two months ago for unclear reason. - 1/14 started heparin drip and transitioned to Lovenox per Dr. Julien Nordmann recommendations   Stage IV lung cancer, metastatic  - Dr Julien Nordmann consulted, appreciate recommendations, pt will have outpatient follow up  Anemia of chronic disease, malignancy  - Hg stable in the past 48 hours  - CBC in AM  Hypokalemia from diarrhea  - supplemented and remains WNL   CAD s/p stent in 2007 - h/o chronic systolic chf: echo pending  HTN, essential  - has  been taken off bp meds since 03/2015 due to progressive weakness and low bp - started low dose lopressor on 1/15 - reasonable inpatient control for now   Report new onset of afib  - on night of 1/16 to 1/17 - on heparin drip, continue betablocker, keep k>4, mag >2.  - transitioned to Lovenox   Moderate to Severe malnutrition - bedbound status, very low amount of subc fat on CT scan, recent weight  loss, nutrition consulted   RUE pain and swelling - from the site were IV access was  - XRAY with no acute findings  - allow analgesia as needed, re evaluate in AM  DVT prophylaxis - on heparin drip   Code Status: Full.  Family Communication:  plan of care discussed with the patient Disposition Plan: Home by 1/26  IV access:  Peripheral IV  Procedures and diagnostic studies:    Ct Abdomen Pelvis Chest Wo Contrast 06/14/2015  New small to moderate bilateral effusions. Associated collapse/consolidation in the right lower lobe, possibly due to pneumonia. Underlying disease recurrence cannot be excluded. 2. Spiculated left upper lobe nodule is stable to minimally decreased in size. Additional scattered pulmonary nodules in the right lung are better seen on the prior exam. 3. Stable osseous metastatic disease. 4. Thick walled collection of fluid and air in the paraspinous musculature overlying the right aspect of the lumbar spine, largely new from 03/29/2015. Finding may be due to an abscess. Please correlate clinically. 5. Heterogeneous low-attenuation lesion in the upper pole right kidney, stable but indeterminate. 6. Ascending aortic aneurysm. Small suprarenal abdominal aortic aneurysm.   Ct Aspiration 06/17/2015 Technically successful CT-guided aspiration of right paraspinal collection. E  Dg Chest Port 1 View 06/16/2015  No significant change when compared to the recent chest CT. 2. Bilateral pleural effusions with associated lung base atelectasis. No convincing pneumonia or edema. 1 cm left upper lobe spiculated nodule.   Dg Foot 2 Views Right 06/16/2015 Moderate osteopenia with diffuse heterogeneity of the marrow. This likely represents osseous metastases. Similar appearance could be seen with severe osteopenia. 2. Extensive soft tissue swelling. This may represent edema or infection. There is no underlying bone destruction.   Medical Consultants:  Oncology Dr Benay Spice ( over the phone on  1/13) , Dr Alen Blew on 1/14, primary oncologist Dr. Julien Nordmann notified on Monday 1/16. IR for paraspinous fluids collection drain ( talked To Dr. Kathlene Cote) General surgery Dr Hassell Done on 1/15, please see detail above. Infectious disease   Faye Ramsay, MD  [facility] Pager [phone]  If 7PM-7AM, please contact night-coverage [email or web address] Password [email or web address] 06/28/2015, 6:04 PM   LOS: 12 days   HPI/Subjective: No events overnight.   Objective: Filed Vitals:   06/27/15 2140 06/28/15 0525 06/28/15 0853 06/28/15 1416  BP: 105/55 108/52 106/55 95/57  Pulse: 85 80 80 74  Temp: 98.3 F (36.8 C) 98 F (36.7 C) 97.5 F (36.4 C) 98 F (36.7 C)  TempSrc: Oral Oral Oral Oral  Resp: '18 18 16 15  '$ Height:      Weight:      SpO2: 96% 97% 98% 98%    Intake/Output Summary (Last 24 hours) at 06/28/15 1804 Last data filed at 06/28/15 0711  Gross per 24 hour  Intake     10 ml  Output    800 ml  Net   -790 ml    Exam:   General:  Pt is alert, follows commands appropriately, not in acute distress  Cardiovascular: Irregular rate and rhythm, no rubs,  no gallops  Respiratory: Clear to auscultation bilaterally, no wheezing, no crackles, no rhonchi  Data Reviewed: Basic Metabolic Panel:  Recent Labs Lab 06/24/15 0545 06/25/15 0430 06/26/15 0337 06/27/15 0524 06/28/15 0450  NA 139 138 137 137 136  K 3.1* 4.1 4.0 4.3 3.9  CL 111 111 109 110 109  CO2 21* 21* 20* 21* 21*  GLUCOSE 68 84 91 86 77  BUN 44* 46* 46* 51* 54*  CREATININE 1.79* 1.86* 1.72* 1.92* 1.83*  CALCIUM 7.6* 7.7* 7.7* 7.9* 7.4*   CBC:  Recent Labs Lab 06/24/15 0545 06/25/15 0430 06/26/15 0337 06/27/15 0524 06/28/15 0450  WBC 6.1 7.5 6.7 7.3 6.2  HGB 8.0* 7.9* 7.8* 8.3* 7.8*  HCT 25.2* 25.3* 23.9* 26.2* 24.9*  MCV 86.6 89.7 86.9 88.5 88.9  PLT 155 157 158 202 209   Cardiac Enzymes: No results for input(s): CKTOTAL, CKMB, CKMBINDEX, TROPONINI in the last 168 hours. Recent Results (from the past 240 hour(s))   C difficile quick scan w PCR reflex     Status: None   Collection Time: 06/19/15  7:47 PM  Result Value Ref Range Status   C Diff antigen NEGATIVE NEGATIVE Final   C Diff toxin NEGATIVE NEGATIVE Final   C Diff interpretation Negative for toxigenic C. difficile  Final     Scheduled Meds: . diphenhydrAMINE  50 mg Oral Once  . enoxaparin (LOVENOX) injection  1 mg/kg Subcutaneous Q24H  . ertapenem  500 mg Intravenous Q24H  . feeding supplement (PRO-STAT SUGAR FREE 64)  30 mL Oral BID  . gabapentin  100 mg Oral QHS  . loperamide  2 mg Oral BID  . metoprolol tartrate  12.5 mg Oral BID  . pantoprazole  40 mg Oral Daily  . sodium bicarbonate  650 mg Oral BID  . sodium chloride  10-40 mL Intracatheter Q12H  . sodium chloride  3 mL Intravenous Q12H   Continuous Infusions: . sodium chloride 10 mL/hr at 06/23/15 1013

## 2015-06-28 NOTE — Progress Notes (Signed)
Pharmacy Antibiotic Follow-up Note  [name] is a 82 y.o. year-old female admitted on 06/16/2015.  The patient is currently on day 13/28 antibiotics (currently ertapenem 500 mg IV q24h) for treatment of sepsis due to polymicrobial bacteremia with secondary paraspinal abscess and multiple chronic wounds.  Assessment: Current ertapenem dosage remains appropriate (estimated CrCl < 30 mL/min) Plan: Continue ertapenem 500 mg IV q24h to complete 4 weeks of therapy as per ID recommendation.  Temp (24hrs), Avg:98.2 F (36.8 C), Min:98 F (36.7 C), Max:98.3 F (36.8 C)   Recent Labs Lab 06/24/15 0545 06/25/15 0430 06/26/15 0337 06/27/15 0524 06/28/15 0450  WBC 6.1 7.5 6.7 7.3 6.2     Recent Labs Lab 06/24/15 0545 06/25/15 0430 06/26/15 0337 06/27/15 0524 06/28/15 0450  CREATININE 1.79* 1.86* 1.72* 1.92* 1.83*   Estimated Creatinine Clearance: 23.6 mL/min (by C-G formula based on Cr of 1.83).    Allergies  Allergen Reactions  . Amlodipine Swelling  . Ciprofloxacin Hives and Other (See Comments)    REACTION: weakness  . Mirtazapine Other (See Comments)    Hallucinations and nightmares, verbally aggressive   . Statins Other (See Comments)  . Aspirin Swelling    Mouth swelling, tongue Patient reports that she tolerates ibuprofen without problem  . Cephalexin Hives  . Hydralazine Nausea And Vomiting  . Iron Nausea And Vomiting  . Ambien [Zolpidem Tartrate] Other (See Comments)    confusion  . Lorazepam Other (See Comments)    Hallucinations, verbally aggressive  . Sulfonamide Derivatives Hives  . Zolpidem     Confusion  . Penicillins Other (See Comments)    Has patient had a PCN reaction causing immediate rash, facial/tongue/throat swelling, SOB or lightheadedness with hypotension: Yes Has patient had a PCN reaction causing severe rash involving mucus membranes or skin necrosis: Yes Has patient had a PCN reaction that required hospitalization No Has patient had a  PCN reaction occurring within the last 10 years: No If all of the above answers are "NO", then may proceed with Cephalosporin use.   . Shellfish Allergy Hives and Rash  . Tape Rash    Antimicrobials this admission: 1/13 >> vancomycin >> 1/16 1/13 >> aztreonam >>   1/16 1/13 >> vancomycin PO (empiric) >> 1/16 1/16 >> primaxin >>  Levels/dose changes this admission: none  Microbiology results: 1/13 blood 1 of 2: 1) Klebsiella oxytoca - resistant to Ancef, ampicillin  2) Enterobacter cloacae- resistant to Ancef, Bactrim 1/13 wound (sacral wound): few MSSA (R-tetracyc only) 1/13 wound (R foot): abundant MSSA 1/14 MRSA screen neg 1/14 abscess cx: multiple org present, none predom 1/14 urine: >100k group B strep Cdiff 1/16: negative  Thank you for allowing Pharmacy to participate in this patient's care.  Clayburn Pert, PharmD, BCPS Pager: [phone] 06/28/2015  7:48 AM

## 2015-06-28 NOTE — Progress Notes (Signed)
ANTICOAGULATION CONSULT NOTE - F/u Consult  Pharmacy Consult for Lovenox  Indication: DVT  Allergies  Allergen Reactions  . Amlodipine Swelling  . Ciprofloxacin Hives and Other (See Comments)    REACTION: weakness  . Mirtazapine Other (See Comments)    Hallucinations and nightmares, verbally aggressive   . Statins Other (See Comments)  . Aspirin Swelling    Mouth swelling, tongue Patient reports that she tolerates ibuprofen without problem  . Cephalexin Hives  . Hydralazine Nausea And Vomiting  . Iron Nausea And Vomiting  . Ambien [Zolpidem Tartrate] Other (See Comments)    confusion  . Lorazepam Other (See Comments)    Hallucinations, verbally aggressive  . Sulfonamide Derivatives Hives  . Zolpidem     Confusion  . Penicillins Other (See Comments)    Has patient had a PCN reaction causing immediate rash, facial/tongue/throat swelling, SOB or lightheadedness with hypotension: Yes Has patient had a PCN reaction causing severe rash involving mucus membranes or skin necrosis: Yes Has patient had a PCN reaction that required hospitalization No Has patient had a PCN reaction occurring within the last 10 years: No If all of the above answers are "NO", then may proceed with Cephalosporin use.   . Shellfish Allergy Hives and Rash  . Tape Rash    Patient Measurements: Height: '5\' 8"'$  (172.7 cm) Weight: 132 lb 4.4 oz (60 kg) IBW/kg (Calculated) : 63.9 Heparin Dosing Weight: using actual body weight of 60 kg  Vital Signs: Temp: 98 F (36.7 C) (01/25 0525) Temp Source: Oral (01/25 0525) BP: 108/52 mmHg (01/25 0525) Pulse Rate: 80 (01/25 0525)  Labs:  Recent Labs  06/26/15 0337 06/27/15 0524 06/28/15 0450  HGB 7.8* 8.3* 7.8*  HCT 23.9* 26.2* 24.9*  PLT 158 202 209  CREATININE 1.72* 1.92* 1.83*    Estimated Creatinine Clearance: 23.6 mL/min (by C-G formula based on Cr of 1.83).   Medical History: Past Medical History  Diagnosis Date  . Hypertension   .  Hypercholesterolemia   . CKD (chronic kidney disease)   . Coronary artery disease     a.  LHC (06/04/05): LHC done in [place] with high grade RCA => s/p BMS to RCA;  b.  Nuclear (09/14/09): Lexiscan; Inf infarct with mild peri-infarct ishemia, EF 52%; Low Risk.  Marland Kitchen GERD (gastroesophageal reflux disease)   . Chronic anemia   . Ischemic cardiomyopathy     a. Echo (07/26/13): Mild LVH, EF 35-40%, diff HK, inf AK, Gr 2 DD, Tr AI, mildly dilated Ao root, MAC, mild MR, mild LAE, mod reduced RVSF.  . Non-small cell carcinoma of lung ([place])     Stage IV  . Chronic fatigue 04/04/2015  . Chronic fatigue 04/04/2015    Medications:  Scheduled:  . diphenhydrAMINE  50 mg Oral Once  . enoxaparin (LOVENOX) injection  1 mg/kg Subcutaneous Q24H  . ertapenem  500 mg Intravenous Q24H  . feeding supplement (PRO-STAT SUGAR FREE 64)  30 mL Oral BID  . gabapentin  100 mg Oral QHS  . loperamide  2 mg Oral BID  . metoprolol tartrate  12.5 mg Oral BID  . pantoprazole  40 mg Oral Daily  . sodium bicarbonate  650 mg Oral BID  . sodium chloride  10-40 mL Intracatheter Q12H  . sodium chloride  3 mL Intravenous Q12H    Assessment: Pt is a 82 yo female with metastatic NSCLC, now diagnosed with DVT noted in the right common femoral vein, right femoral vein, right profunda femoral vein, right  popliteal vein, and right proximal peroneal veins. Pt was previously on enoxaparin PTA but reportedly has not been on medication for greater than 30 days. Pharmacy was consulted to being heparin infusion 1/14 PM, then transitioned to Lovenox.  Hx of CKD noted.   Significant Events: 1/14 am paraspinal fluid aspiration by IR 1/14 pm heparin infusion initiated 1/17: pt received PRBC 1/21: transitioned from IV heparin to full-dose Lovenox ('1mg'$ /kg SQ q24h due to CrCl < 30 mL/min)   Today, 06/28/2015:  Lovenox 60 mg ('1mg'$ /kg) SQ q24h  No bleeding reported in most recent chart note.  Hgb remains low.   Pltc remains WNL.    SCr elevated, stable; CrCl remains < 30 mL/min . Goal of Therapy: Full dose Lovenox with dosage adjusted for renal function Monitor platelets by anticoagulation protocol: Yes  Plan:  Continue Lovenox 60 mg ('1mg'$ /kg) SQ q24h  Monitor renal function, H/H, pltc, clinical course.  Clayburn Pert, PharmD, BCPS Pager: [phone] 06/28/2015  7:55 AM       .

## 2015-06-28 NOTE — Progress Notes (Signed)
Date: June 28, 2015 Chart reviewed for concurrent status and case management needs. Will continue to follow patient for changes and needs: Velva Harman, BSN, [place], [employer],   [phone]

## 2015-06-29 ENCOUNTER — Inpatient Hospital Stay (HOSPITAL_COMMUNITY): Payer: Medicare Other

## 2015-06-29 DIAGNOSIS — R609 Edema, unspecified: Secondary | ICD-10-CM

## 2015-06-29 LAB — BASIC METABOLIC PANEL
ANION GAP: 7 (ref 5–15)
BUN: 51 mg/dL — AB (ref 6–20)
CO2: 22 mmol/L (ref 22–32)
Calcium: 7.3 mg/dL — ABNORMAL LOW (ref 8.9–10.3)
Chloride: 110 mmol/L (ref 101–111)
Creatinine, Ser: 1.84 mg/dL — ABNORMAL HIGH (ref 0.44–1.00)
GFR calc Af Amer: 29 mL/min — ABNORMAL LOW (ref >60)
GFR, EST NON AFRICAN AMERICAN: 25 mL/min — AB (ref >60)
GLUCOSE: 72 mg/dL (ref 65–99)
POTASSIUM: 3.9 mmol/L (ref 3.5–5.1)
Sodium: 139 mmol/L (ref 135–145)

## 2015-06-29 LAB — CBC
HEMATOCRIT: 23.6 % — AB (ref 36.0–46.0)
Hemoglobin: 7.3 g/dL — ABNORMAL LOW (ref 12.0–15.0)
MCH: 27.7 pg (ref 26.0–34.0)
MCHC: 30.9 g/dL (ref 30.0–36.0)
MCV: 89.4 fL (ref 78.0–100.0)
PLATELETS: 194 10*3/uL (ref 150–400)
RBC: 2.64 MIL/uL — AB (ref 3.87–5.11)
RDW: 16.7 % — AB (ref 11.5–15.5)
WBC: 4.9 10*3/uL (ref 4.0–10.5)

## 2015-06-29 LAB — PREPARE RBC (CROSSMATCH)

## 2015-06-29 LAB — URIC ACID: Uric Acid, Serum: 7.5 mg/dL — ABNORMAL HIGH (ref 2.3–6.6)

## 2015-06-29 MED ORDER — SODIUM CHLORIDE 0.9 % IV SOLN
Freq: Once | INTRAVENOUS | Status: AC
  Administered 2015-06-29: 14:00:00 via INTRAVENOUS

## 2015-06-29 MED ORDER — FUROSEMIDE 10 MG/ML IJ SOLN
20.0000 mg | Freq: Every day | INTRAMUSCULAR | Status: DC
  Administered 2015-06-29 – 2015-07-03 (×5): 20 mg via INTRAVENOUS
  Filled 2015-06-29 (×6): qty 2

## 2015-06-29 NOTE — Progress Notes (Addendum)
Patient ID: KENADIE ROYCE, female   DOB: 02-Sep-1933, 82 y.o.   MRN: [id number]  [facility] PROGRESS NOTE  MYRTIE LEUTHOLD OZD:[id number] DOB: [birth year] DOA: 06/16/2015 PCP: Wende Neighbors, MD   Brief narrative:    Pt is 82 yo female with HT, CAD s/p stent in 2007, h/o chronic systolic chf, not a cnadidate for ace/arb due to ckd IV, she has been taken off blood pressure meds in 03/2015 due to progressive weakness and low bp, h/o stage IV NSCLC initially diagnosed in 2007, with disease recurrent in 11/2014, s/p XRT to right paraspinous mass with osseous invasion at L5 , finished on 12/23/2014, she was then treated with systemic chemo with [place] and paclitaxel which was stopped after two cycles due to intolerance, and mild disease progression, she is currently under immunotherapy with PD1 inhibitor, last on 12/23. She was sent from oncology clinic for evaluation of hypothermia and hypotension, diarrhea for the last two weeks.  Assessment/Plan:    Hypothermia/hypotension/sepsis - present on admission 1/13 with multiple possible source including sacral wound, right hip wound, paraspinal abscess, UTI - polymicrobial paraspinal abscess, s/p aspiration, wound colonized with MSSA, per ID recommend to treat for total 21 days, today is day #15 out of 21 days, can be discharged on primaxin daily dosing  - appreciate assistance of ID team  - per ID, portacath may also be seeded with klebsiella and enterobacter, though only 1 of 2 sets + - Also  recommendation is to repeat blood cx off of abtx to see if need to pull portacath.  - pt tolerating well so far  Paraspinal fluids collection/abscess - CT guided aspiration of the paraspinal fluids on 1/14, ABX as noted above per ID team  Sacral wound: chronic, stage IV present prior to this admission  - per daughter, patient was recently released from the wound care center, has been getting wound care by home health RN.  - per surgery, Dr. Hassell Done on 1/15,  continue conservative management for now - provide air matress  Right leg wound - right leg xray with extensive soft tissue swelling, edema vs infection, marked osteropenia? Osseous metastases - continue with wound care  - Lower extremity venous US + acute DVT - keep extremity elevated   Diarrhea - imodium as needed as it is helping, less diarrhea  Bilateral pleural effusion - echocardiogram lvef 50-55% with grade I diastolic dysfunction - volume status stable   Acute on chronic kidney disease, stage III/IV with history of right sided hydronephrosis - s/p percutaneous nephrostomy tube,in 11/2014 which later removed  - Cr overall trending down - BMP in AM  Bilateral pedal edema - right lower extremity edema, H/o bilateral lower extremity DVT: on AC  - provide Lasix 20 mg IV QD and monitor clinical response   Acute DVT - right common femoral vein, right femoral vein, right profunda femoral vein, right popliteal vein, and right proximal peroneal veins. - now with R extremity DVT as well 1/26 - heparin drip vs ivc filter? Patient was on lovenox for previous DVt, but was taken off lovenox two months ago for unclear reason. - 1/14 started heparin drip and transitioned to Lovenox per Dr. Julien Nordmann recommendations   Stage IV lung cancer, metastatic  - Dr Julien Nordmann consulted, appreciate recommendations, pt will have outpatient follow up  Anemia of chronic disease, malignancy  - Hg down in the past 24 hours, transfuse one unit of blood today 1/26 - unclear if this is related to Lovenox but needs monitoring  -  CBC in AM  Hypokalemia from diarrhea  - supplemented and remains WNL   CAD s/p stent in 2007 - h/o chronic systolic chf: echo pending  HTN, essential  - has been taken off bp meds since 03/2015 due to progressive weakness and low bp - started low dose lopressor on 1/15 - reasonable inpatient control for now   Report new onset of afib  - on night of 1/16 to 1/17 - keep k>4,  mag >2.  - continue Lovenox   Moderate to Severe malnutrition - bedbound status, very low amount of subc fat on CT scan, recent weight loss, nutrition consulted   RUE pain and swelling - DVT noted on [place], already on Lovenox    Code Status: Full.  Family Communication:  plan of care discussed with the patient and daughter  Disposition Plan: Home by 1/27  IV access:  Peripheral IV  Procedures and diagnostic studies:    Ct Abdomen Pelvis Chest Wo Contrast 06/14/2015  New small to moderate bilateral effusions. Associated collapse/consolidation in the right lower lobe, possibly due to pneumonia. Underlying disease recurrence cannot be excluded. 2. Spiculated left upper lobe nodule is stable to minimally decreased in size. Additional scattered pulmonary nodules in the right lung are better seen on the prior exam. 3. Stable osseous metastatic disease. 4. Thick walled collection of fluid and air in the paraspinous musculature overlying the right aspect of the lumbar spine, largely new from 03/29/2015. Finding may be due to an abscess. Please correlate clinically. 5. Heterogeneous low-attenuation lesion in the upper pole right kidney, stable but indeterminate. 6. Ascending aortic aneurysm. Small suprarenal abdominal aortic aneurysm.   Ct Aspiration 06/17/2015 Technically successful CT-guided aspiration of right paraspinal collection. E  Dg Chest Port 1 View 06/16/2015  No significant change when compared to the recent chest CT. 2. Bilateral pleural effusions with associated lung base atelectasis. No convincing pneumonia or edema. 1 cm left upper lobe spiculated nodule.   Dg Foot 2 Views Right 06/16/2015 Moderate osteopenia with diffuse heterogeneity of the marrow. This likely represents osseous metastases. Similar appearance could be seen with severe osteopenia. 2. Extensive soft tissue swelling. This may represent edema or infection. There is no underlying bone destruction.   Medical Consultants:   Oncology Dr Benay Spice ( over the phone on 1/13) , Dr Alen Blew on 1/14, primary oncologist Dr. Julien Nordmann notified on Monday 1/16. IR for paraspinous fluids collection drain ( talked To Dr. Kathlene Cote) General surgery Dr Hassell Done on 1/15, please see detail above. Infectious disease   Faye Ramsay, MD  [facility] Pager [phone]  If 7PM-7AM, please contact night-coverage [email or web address] Password [facility] 06/29/2015, 3:16 PM   LOS: 13 days   HPI/Subjective: No events overnight.   Objective: Filed Vitals:   06/28/15 2114 06/29/15 1330 06/29/15 1356 06/29/15 1437  BP: 106/52 106/40 89/39 111/50  Pulse: 83 72 72 73  Temp: 97.5 F (36.4 C) 97.4 F (36.3 C) 97.6 F (36.4 C) 97.4 F (36.3 C)  TempSrc: Oral Oral Oral Axillary  Resp: '15 16 16 16  '$ Height:      Weight:      SpO2: 98% 98% 98% 100%    Intake/Output Summary (Last 24 hours) at 06/29/15 1516 Last data filed at 06/29/15 1341  Gross per 24 hour  Intake     30 ml  Output      0 ml  Net     30 ml    Exam:   General:  Pt is alert, follows commands appropriately, not  in acute distress  Cardiovascular: Irregular rate and rhythm, no rubs, no gallops  Respiratory: Clear to auscultation bilaterally, no wheezing, no crackles, no rhonchi  Data Reviewed: Basic Metabolic Panel:  Recent Labs Lab 06/25/15 0430 06/26/15 0337 06/27/15 0524 06/28/15 0450 06/29/15 0500  NA 138 137 137 136 139  K 4.1 4.0 4.3 3.9 3.9  CL 111 109 110 109 110  CO2 21* 20* 21* 21* 22  GLUCOSE 84 91 86 77 72  BUN 46* 46* 51* 54* 51*  CREATININE 1.86* 1.72* 1.92* 1.83* 1.84*  CALCIUM 7.7* 7.7* 7.9* 7.4* 7.3*   CBC:  Recent Labs Lab 06/25/15 0430 06/26/15 0337 06/27/15 0524 06/28/15 0450 06/29/15 0500  WBC 7.5 6.7 7.3 6.2 4.9  HGB 7.9* 7.8* 8.3* 7.8* 7.3*  HCT 25.3* 23.9* 26.2* 24.9* 23.6*  MCV 89.7 86.9 88.5 88.9 89.4  PLT 157 158 202 209 194   Cardiac Enzymes: No results for input(s): CKTOTAL, CKMB, CKMBINDEX, TROPONINI in the last 168  hours. Recent Results (from the past 240 hour(s))  C difficile quick scan w PCR reflex     Status: None   Collection Time: 06/19/15  7:47 PM  Result Value Ref Range Status   C Diff antigen NEGATIVE NEGATIVE Final   C Diff toxin NEGATIVE NEGATIVE Final   C Diff interpretation Negative for toxigenic C. difficile  Final     Scheduled Meds: . diphenhydrAMINE  50 mg Oral Once  . enoxaparin (LOVENOX) injection  1 mg/kg Subcutaneous Q24H  . ertapenem  500 mg Intravenous Q24H  . feeding supplement (PRO-STAT SUGAR FREE 64)  30 mL Oral BID  . gabapentin  100 mg Oral QHS  . loperamide  2 mg Oral BID  . metoprolol tartrate  12.5 mg Oral BID  . pantoprazole  40 mg Oral Daily  . sodium bicarbonate  650 mg Oral BID  . sodium chloride  10-40 mL Intracatheter Q12H  . sodium chloride  3 mL Intravenous Q12H   Continuous Infusions: . sodium chloride 10 mL/hr at 06/23/15 1013

## 2015-06-29 NOTE — Progress Notes (Signed)
*  PRELIMINARY RESULTS* Vascular Ultrasound Right upper extremity venous duplex has been completed.  Preliminary findings: DVT noted in the right IJV.    Landry Mellow, RDMS, RVT  06/29/2015, 11:05 AM

## 2015-06-30 DIAGNOSIS — I82401 Acute embolism and thrombosis of unspecified deep veins of right lower extremity: Secondary | ICD-10-CM

## 2015-06-30 DIAGNOSIS — L97519 Non-pressure chronic ulcer of other part of right foot with unspecified severity: Secondary | ICD-10-CM | POA: Insufficient documentation

## 2015-06-30 LAB — TYPE AND SCREEN
ABO/RH(D): O POS
ANTIBODY SCREEN: NEGATIVE
UNIT DIVISION: 0

## 2015-06-30 LAB — BASIC METABOLIC PANEL
Anion gap: 7 (ref 5–15)
BUN: 50 mg/dL — AB (ref 6–20)
CALCIUM: 7.6 mg/dL — AB (ref 8.9–10.3)
CO2: 23 mmol/L (ref 22–32)
CREATININE: 1.97 mg/dL — AB (ref 0.44–1.00)
Chloride: 109 mmol/L (ref 101–111)
GFR calc Af Amer: 27 mL/min — ABNORMAL LOW (ref >60)
GFR, EST NON AFRICAN AMERICAN: 23 mL/min — AB (ref >60)
GLUCOSE: 72 mg/dL (ref 65–99)
Potassium: 3.8 mmol/L (ref 3.5–5.1)
SODIUM: 139 mmol/L (ref 135–145)

## 2015-06-30 LAB — CBC
HEMATOCRIT: 28.2 % — AB (ref 36.0–46.0)
Hemoglobin: 9.1 g/dL — ABNORMAL LOW (ref 12.0–15.0)
MCH: 28.4 pg (ref 26.0–34.0)
MCHC: 32.3 g/dL (ref 30.0–36.0)
MCV: 88.1 fL (ref 78.0–100.0)
PLATELETS: 214 10*3/uL (ref 150–400)
RBC: 3.2 MIL/uL — ABNORMAL LOW (ref 3.87–5.11)
RDW: 16 % — AB (ref 11.5–15.5)
WBC: 4.3 10*3/uL (ref 4.0–10.5)

## 2015-06-30 MED ORDER — KETOROLAC TROMETHAMINE 30 MG/ML IJ SOLN: 15.0000 mg | Freq: Four times a day (QID) | INTRAMUSCULAR | Status: DC | PRN

## 2015-06-30 NOTE — Care Management Important Message (Signed)
Important Message  Patient Details IM Letter given to Nora/Case Manager to present to Patient Name: [name] MRN: [id number] Date of Birth: 1934/01/04   Medicare Important Message Given:  Yes    Camillo Flaming 06/30/2015, 11:57 AMImportant Message  Patient Details  Name: [name] MRN: [id number] Date of Birth: 05/27/1934   Medicare Important Message Given:  Yes    Camillo Flaming 06/30/2015, 11:57 AM

## 2015-06-30 NOTE — Progress Notes (Signed)
[facility] for Infectious Disease    Date of Admission:  06/16/2015   Total days of antibiotics 15        Day 5 ertapenem           [name] is a 82 y.o. female with  HTN, CKD, CAD, GERD, and stage IV NSCLC involving lumbar spine, debilitated who has sacral, right hip, and left heel pressure ulcers presents with sepsis (hypotension, hypothermia) found to have polymicrobial bacteremia in setting of 2 wk fo watery diarrhea as well as polymicrobial paraspinal abscess s/p aspiration. Wound as colonized with MSSA. +/- GBS uti. She has portacath but only had 1 of 2 blood cx with enterobacter/klebsiella. Also found to have DVT noted in the right common femoral vein, right femoral vein, right profunda femoral vein, right popliteal vein, and right proximal peroneal veins. Principal Problem:   Sepsis ([place]) Active Problems:   Pressure ulcer   Lung cancer stage IV with Metastatic squamous cell carcinoma to bone (HCC)   Hypotension   Acute renal failure superimposed on stage 3 chronic kidney disease (HCC)   Anemia of chronic disease   Hypokalemia   Hypothermia   Paraspinal abscess (HCC)   Malnutrition of moderate degree   Edema   Right leg DVT (HCC)   Diarrhea   Abscess    Subjective: Feeling better, had 2 semiformed BM. Wound healing better per daughter report.  Interval hx: yesterday found to have R IJ DVT  Medications:  . diphenhydrAMINE  50 mg Oral Once  . enoxaparin (LOVENOX) injection  1 mg/kg Subcutaneous Q24H  . ertapenem  500 mg Intravenous Q24H  . feeding supplement (PRO-STAT SUGAR FREE 64)  30 mL Oral BID  . furosemide  20 mg Intravenous Daily  . gabapentin  100 mg Oral QHS  . loperamide  2 mg Oral BID  . metoprolol tartrate  12.5 mg Oral BID  . pantoprazole  40 mg Oral Daily  . sodium bicarbonate  650 mg Oral BID  . sodium chloride  10-40 mL Intracatheter Q12H  . sodium chloride  3 mL Intravenous Q12H    Objective: Vital signs in last 24  hours: Temp:  [94.4 F (34.7 C)-98 F (36.7 C)] 94.6 F (34.8 C) (01/27 1000) Pulse Rate:  [62-83] 62 (01/27 1130) Resp:  [9-16] 14 (01/27 1020) BP: (95-133)/(47-57) 95/52 mmHg (01/27 1130) SpO2:  [96 %-100 %] 99 % (01/27 1000) Constitutional: oriented to person, place, appears frail, age appropriate, thin/emaciate. No distress. sleeping but easily arousable HENT: [place]/AT, PERRLA, no scleral icterus Mouth/Throat: Oropharynx is clear and moist. No oropharyngeal exudate.  Cardiovascular: Normal rate, regular rhythm and normal heart sounds. Exam reveals no gallop and no friction rub.  No murmur heard.  Pulmonary/Chest: Effort normal and breath sounds normal. No respiratory distress. has no wheezes.  Neck = supple, no nuchal rigidity Abdominal: Soft. Bowel sounds are normal. exhibits no distension. There is no tenderness.  Lymphadenopathy: no cervical adenopathy. No axillary adenopathy Neurological: alert and oriented to person, place, and time.  Skin: numerous echymosis throughout torso and extremities with skin tear to left fore arm, left shoulder. Multiple skin tears Sacrum = large wound stage 4 with fibrinous debris at base  Ext: left heel pressure ulcer, as described above. +2 pitting edema due to anasarca. Right leg greater than left (also r leg is + DVT). Psychiatric: a normal mood and affect. behavior is normal.     Lab Results  Recent Labs  06/29/15 0500  06/30/15 0650  WBC 4.9 4.3  HGB 7.3* 9.1*  HCT 23.6* 28.2*  NA 139 139  K 3.9 3.8  CL 110 109  CO2 22 23  BUN 51* 50*  CREATININE 1.84* 1.97*   Lab Results  Component Value Date   ESRSEDRATE 17 06/16/2015   Lab Results  Component Value Date   CRP 11.0* 06/16/2015    Assessment/Plan: 82yo F with HTN, CKD, CAD, GERD, and stage IV NSCLC involving lumbar spine, debilitated who has sacral, right hip, and left heel pressure ulcers presents with sepsis (hypotension, hypothermia) found to have polymicrobial  bacteremia in setting of 2 wk fo watery diarrhea as well as polymicrobial paraspinal abscess s/p aspiration. Wound as colonized with MSSA. +/- GBS uti. She has portacath but only had 1 of 2 blood cx with GNR. Complains of ongoing diarrhea. Now found to have multiple clots (likely related to malignancy)  Diarrhea = cdiff is ruled out. Recommend to schedule immodium 2 x per day to see if it helps her symptoms.   Polymicrobial bacteremia with secondary paraspinal abscess = plan to treat for 4 wk she is currently on day 12 of 28. Can discharge on ertapenem for once a day dosing, currently on day 12 of 28 days   her portacath may also be seeded with klebsiella and enterobacter, though only 1 of 2 sets +.  repeat blood cx off of abtx to see if need to pull portacath.   Decub ulcers stage 4 and stage 3 = would need to ensure she has the proper home health to care for wounds, proper air mattress for home hospital bed to help with improvement possibly. She is colonized with MSSA and would get some benefit from tx bacteremia. Wound care is key especially to keep feculant material out of the wound  NSCLC = defer to oncology for further managmenet  DVT of right leg, right IJ = currently on anticoagulation, defer to heme and primary team for management  Will see back on Monday. If you have questions ,Dr Linus Salmons available for questions  [facility] for Infectious Diseases Cell: [phone] Pager: [phone]  06/30/2015, 2:10 PM

## 2015-06-30 NOTE — Progress Notes (Addendum)
Patient ID: [name], female   DOB: 1933/12/24, 82 y.o.   MRN: [id number]  [facility] PROGRESS NOTE  CHAMEKA MCMULLEN CBJ:[id number] DOB: 06/13/1933 DOA: 06/16/2015 PCP: Wende Neighbors, MD   Brief narrative:    Pt is 82 yo female with HT, CAD s/p stent in 2007, h/o chronic systolic chf, not a cnadidate for ace/arb due to ckd IV, she has been taken off blood pressure meds in 03/2015 due to progressive weakness and low bp, h/o stage IV NSCLC initially diagnosed in 2007, with disease recurrent in 11/2014, s/p XRT to right paraspinous mass with osseous invasion at L5 , finished on 12/23/2014, she was then treated with systemic chemo with [place] and paclitaxel which was stopped after two cycles due to intolerance, and mild disease progression, she is currently under immunotherapy with PD1 inhibitor, last on 12/23. She was sent from oncology clinic for evaluation of hypothermia and hypotension, diarrhea for the last two weeks.  Barrier to discharge: was doing better overall and was going to discharge 1/27 but suddenly became hypothermic with T in 95 - 51F, asymptomatic. Keep for additional 24 hours in the hospital.   Assessment/Plan:    Hypothermia/hypotension/sepsis - present on admission 1/13 with multiple possible source including sacral wound, right hip wound, paraspinal abscess, UTI - polymicrobial paraspinal abscess, s/p aspiration, wound colonized with MSSA, per ID recommend to treat for total 28 days, today is day #16 out of 28 days, can be discharged on invanz daily dosing  - appreciate assistance of ID team  - per ID, portacath may also be seeded with klebsiella and enterobacter, though only 1 of 2 sets + - Also  recommendation is to repeat blood cx off of abtx to see if need to pull portacath.  - pt tolerating well so far  Paraspinal fluids collection/abscess - CT guided aspiration of the paraspinal fluids on 1/14, ABX as noted above per ID team  Sacral wound: chronic, stage IV  present prior to this admission  - per daughter, patient was recently released from the wound care center, has been getting wound care by home health RN.  - per surgery, Dr. Hassell Done on 1/15, continue conservative management for now - provide air matress  Right leg wound - right leg xray with extensive soft tissue swelling, edema vs infection, marked osteropenia? Osseous metastases - continue with wound care  - Lower extremity venous US + acute DVT - keep extremity elevated   Diarrhea - imodium as needed as it is helping, less diarrhea  Bilateral pleural effusion - echocardiogram lvef 50-55% with grade I diastolic dysfunction - volume status stable   Acute on chronic kidney disease, stage III/IV with history of right sided hydronephrosis - s/p percutaneous nephrostomy tube,in 11/2014 which later removed  - Cr up slightly in the past 48 hours  - BMP in AM  Bilateral pedal edema - right lower extremity edema, H/o bilateral lower extremity DVT: on AC  - continue to provide Lasix 20 mg IV QD and monitor clinical response   Acute DVT - right common femoral vein, right femoral vein, right profunda femoral vein, right popliteal vein, and right proximal peroneal veins. - now with R extremity DVT as well 1/26 - heparin drip vs ivc filter? Patient was on lovenox for previous DVt, but was taken off lovenox two months ago for unclear reason. - 1/14 started heparin drip and transitioned to Lovenox per Dr. Julien Nordmann recommendations   Stage IV lung cancer, metastatic  - Dr Julien Nordmann consulted,  appreciate recommendations, pt will have outpatient follow up  Anemia of chronic disease, malignancy  - transfused one unit of blood 1/26, appropriate increase in post transfusion Hg - unclear if this is related to Lovenox but needs monitoring  - CBC in AM  Hypokalemia from diarrhea  - supplemented and remains WNL   CAD s/p stent in 2007 - h/o chronic systolic chf: echo pending  HTN, essential  - has  been taken off bp meds since 03/2015 due to progressive weakness and low bp - started low dose lopressor on 1/15 - reasonable inpatient control for now   Report new onset of afib  - on night of 1/16 to 1/17 - keep k~4, mag ~2.  - continue Lovenox   Moderate to Severe malnutrition - bedbound status, very low amount of subc fat on CT scan, recent weight loss, nutrition consulted   RUE pain and swelling 1/26 - DVT noted on [place], already on Lovenox  - no other interventions, continue Lovenox   Code Status: Full.  Family Communication:  plan of care discussed with the patient and daughter  Disposition Plan: ? D/c by 1/28  IV access:  Peripheral IV  Procedures and diagnostic studies:    Ct Abdomen Pelvis Chest Wo Contrast 06/14/2015  New small to moderate bilateral effusions. Associated collapse/consolidation in the right lower lobe, possibly due to pneumonia. Underlying disease recurrence cannot be excluded. 2. Spiculated left upper lobe nodule is stable to minimally decreased in size. Additional scattered pulmonary nodules in the right lung are better seen on the prior exam. 3. Stable osseous metastatic disease. 4. Thick walled collection of fluid and air in the paraspinous musculature overlying the right aspect of the lumbar spine, largely new from 03/29/2015. Finding may be due to an abscess. Please correlate clinically. 5. Heterogeneous low-attenuation lesion in the upper pole right kidney, stable but indeterminate. 6. Ascending aortic aneurysm. Small suprarenal abdominal aortic aneurysm.   Ct Aspiration 06/17/2015 Technically successful CT-guided aspiration of right paraspinal collection. E  Dg Chest Port 1 View 06/16/2015  No significant change when compared to the recent chest CT. 2. Bilateral pleural effusions with associated lung base atelectasis. No convincing pneumonia or edema. 1 cm left upper lobe spiculated nodule.   Dg Foot 2 Views Right 06/16/2015 Moderate osteopenia with  diffuse heterogeneity of the marrow. This likely represents osseous metastases. Similar appearance could be seen with severe osteopenia. 2. Extensive soft tissue swelling. This may represent edema or infection. There is no underlying bone destruction.   Medical Consultants:  Oncology Dr Benay Spice ( over the phone on 1/13) , Dr Alen Blew on 1/14, primary oncologist Dr. Julien Nordmann notified on Monday 1/16. IR for paraspinous fluids collection drain ( talked To Dr. Kathlene Cote) General surgery Dr Hassell Done on 1/15, please see detail above. Infectious disease   Faye Ramsay, MD  [facility] Pager [phone]  If 7PM-7AM, please contact night-coverage [email or web address] Password [email or web address] 06/30/2015, 8:40 AM   LOS: 14 days   HPI/Subjective: No events overnight.   Objective: Filed Vitals:   06/29/15 2127 06/30/15 0639 06/30/15 0706 06/30/15 0737  BP: 109/51 133/55    Pulse: 83 67    Temp: 98 F (36.7 C)  94.9 F (34.9 C) 94.4 F (34.7 C)  TempSrc: Oral   Oral  Resp: 16 16    Height:      Weight:      SpO2: 96% 99%      Intake/Output Summary (Last 24 hours) at 06/30/15 0840 Last data filed at 06/30/15  0644  Gross per 24 hour  Intake    330 ml  Output    600 ml  Net   -270 ml    Exam:   General:  Pt is alert, follows commands appropriately, not in acute distress  Cardiovascular: Irregular rate and rhythm, no rubs, no gallops  Respiratory: Clear to auscultation bilaterally, no wheezing, no crackles, no rhonchi  Data Reviewed: Basic Metabolic Panel:  Recent Labs Lab 06/26/15 0337 06/27/15 0524 06/28/15 0450 06/29/15 0500 06/30/15 0650  NA 137 137 136 139 139  K 4.0 4.3 3.9 3.9 3.8  CL 109 110 109 110 109  CO2 20* 21* 21* 22 23  GLUCOSE 91 86 77 72 72  BUN 46* 51* 54* 51* 50*  CREATININE 1.72* 1.92* 1.83* 1.84* 1.97*  CALCIUM 7.7* 7.9* 7.4* 7.3* 7.6*   CBC:  Recent Labs Lab 06/26/15 0337 06/27/15 0524 06/28/15 0450 06/29/15 0500 06/30/15 0650  WBC 6.7 7.3 6.2 4.9 4.3  HGB  7.8* 8.3* 7.8* 7.3* 9.1*  HCT 23.9* 26.2* 24.9* 23.6* 28.2*  MCV 86.9 88.5 88.9 89.4 88.1  PLT 158 202 209 194 214   Cardiac Enzymes: No results for input(s): CKTOTAL, CKMB, CKMBINDEX, TROPONINI in the last 168 hours. No results found for this or any previous visit (from the past 240 hour(s)).   Scheduled Meds: . diphenhydrAMINE  50 mg Oral Once  . enoxaparin (LOVENOX) injection  1 mg/kg Subcutaneous Q24H  . ertapenem  500 mg Intravenous Q24H  . feeding supplement (PRO-STAT SUGAR FREE 64)  30 mL Oral BID  . furosemide  20 mg Intravenous Daily  . gabapentin  100 mg Oral QHS  . loperamide  2 mg Oral BID  . metoprolol tartrate  12.5 mg Oral BID  . pantoprazole  40 mg Oral Daily  . sodium bicarbonate  650 mg Oral BID  . sodium chloride  10-40 mL Intracatheter Q12H  . sodium chloride  3 mL Intravenous Q12H   Continuous Infusions:

## 2015-07-01 DIAGNOSIS — B961 Klebsiella pneumoniae [K. pneumoniae] as the cause of diseases classified elsewhere: Secondary | ICD-10-CM

## 2015-07-01 DIAGNOSIS — A408 Other streptococcal sepsis: Secondary | ICD-10-CM

## 2015-07-01 DIAGNOSIS — D638 Anemia in other chronic diseases classified elsewhere: Secondary | ICD-10-CM

## 2015-07-01 DIAGNOSIS — E876 Hypokalemia: Secondary | ICD-10-CM

## 2015-07-01 DIAGNOSIS — R7881 Bacteremia: Secondary | ICD-10-CM

## 2015-07-01 LAB — BASIC METABOLIC PANEL
Anion gap: 8 (ref 5–15)
BUN: 56 mg/dL — AB (ref 6–20)
CALCIUM: 7.3 mg/dL — AB (ref 8.9–10.3)
CHLORIDE: 108 mmol/L (ref 101–111)
CO2: 23 mmol/L (ref 22–32)
CREATININE: 1.83 mg/dL — AB (ref 0.44–1.00)
GFR calc non Af Amer: 25 mL/min — ABNORMAL LOW (ref >60)
GFR, EST AFRICAN AMERICAN: 29 mL/min — AB (ref >60)
Glucose, Bld: 70 mg/dL (ref 65–99)
Potassium: 3.7 mmol/L (ref 3.5–5.1)
SODIUM: 139 mmol/L (ref 135–145)

## 2015-07-01 LAB — CBC
HCT: 30.4 % — ABNORMAL LOW (ref 36.0–46.0)
Hemoglobin: 9.7 g/dL — ABNORMAL LOW (ref 12.0–15.0)
MCH: 28.4 pg (ref 26.0–34.0)
MCHC: 31.9 g/dL (ref 30.0–36.0)
MCV: 88.9 fL (ref 78.0–100.0)
Platelets: 224 10*3/uL (ref 150–400)
RBC: 3.42 MIL/uL — ABNORMAL LOW (ref 3.87–5.11)
RDW: 16.2 % — AB (ref 11.5–15.5)
WBC: 4.3 10*3/uL (ref 4.0–10.5)

## 2015-07-01 NOTE — Progress Notes (Addendum)
Patient ID: [name], female   DOB: 1934-05-31, 82 y.o.   MRN: [id number] [facility] PROGRESS NOTE  ARIANA JUUL OLS:[id number] DOB: 03-18-1934 DOA: 06/16/2015 PCP: Dwana Melena, MD  Brief narrative:    82 year old female with past medical history of hypertension, CAD s/p stent in 2007, h/o chronic systolic chf, not a cnadidate for ace/arb due to CKD stage IV, she has been taken off blood pressure meds in 03/2015 due to progressive weakness and low blood pressure, h/o stage IV NSCLC initially diagnosed in 2007, with disease recurrent in 11/2014, s/p XRT to right paraspinous mass with osseous invasion at L5 , finished on 12/23/2014. Pt was then treated with systemic chemotherapy with [place] and paclitaxel which was stopped after two cycles due to intolerance and mild disease progression. Currently she is on immunotherapy with PD1 inhibitor, last on 12/23. She was sent from oncology clinic for evaluation of hypothermia and hypotension and diarrhea for the last two weeks prior to the admission.  Barrier to discharge: still hypothermic with T 94.4 F. Continue to monitor for next 24 hours.   Assessment/Plan:    Principal Problem: Sepsis due to infected sacral wound / Paraspinal abscess / Streptococcus UTI / hypothermia - Sepsis criteria met on admission with multiple sources of infections as a possibility: sacral wound, right hip wound, paraspinal abscess, UTI. Urine culture showed strep agalactiae. - Pt has polymicrobial paraspinal abscess, s/p aspiration, wound colonized with MSSA, per ID recommend plan is to treat for total 28 days, today is day #17 out of 28 days, can be discharged on invanz daily dosing  - Per ID, portacath may also be seeded with klebsiella and enterobacter, though only 1 of 2 sets + - Also recommendation is to repeat blood cx off of abx to see if need to pull portacath.   Active Problems: Klebsiella and enterococcus bacteremia - Seen on Blood culture  06/15/2014 - Repeat blood cultures today  Right leg wound - Right leg xray with extensive soft tissue swelling, edema vs infection, marked osteropenia? Osseous metastases - Continue with wound care   Diarrhea - 1 BM in past 24 hours  - Imodium as needed  Bilateral pleural effusion - 2 D ECHO LV EF 50-55% with grade I diastolic dysfunction - Stable resp status   Acute on chronic kidney disease, stage III/IV with history of right sided hydronephrosis - S/p percutaneous nephrostomy tube in 11/2014 which later was removed  - Cr little better this am  Bilateral pedal edema - Pt on lasix 20 mg IV daily   DVT history  - DVT in right common femoral vein, right femoral vein, right profunda femoral vein, right popliteal vein, and right proximal peroneal veins.. - 1/14 started heparin drip and transitioned to Lovenox subsequently per Dr. Arbutus Ped recommendations   RUE DVT, jugular vein - Noted on US doppler 06/29/2015 - Continue Lovenox   Stage IV lung cancer, metastatic  - Dr Arbutus Ped consulted, appreciate recommendations - Otpt follow up  Anemia of chronic disease - Secondary to history of malignancy - Has required 1 Unit PRBC 06/29/2015  Hypokalemia - Due to GI losses, diarrhea and lasix  - Supplemented   CAD s/p stent in 2007 - Stable - No chest pain   Essential hypertension - On metoprolol 12.5 mg PO BID  New onset of afib  - CHADS vasc score 4 - Noted on 1/16 and 1/17 - On AC with Lovenox - Rate controlled with metoprolol   Moderate protein calorie  malnutrition - In the context of chronic illnesses - Diet as tolerated   DVT Prophylaxis  - Lovenox subQ   Code Status: Full.  Family Communication:  plan of care discussed with the patient Disposition Plan: Home probably by 07/03/2015.  IV access:  Peripheral IV  Procedures and diagnostic studies:    UE doppler 06/29/2015 - RUE DVT in right internal jugular vein   Dg Wrist Complete Right 06/27/2015 No  evidence of fracture or osteomyelitis radiographically. Osteoarthritic changes of the radiocarpal and first carpometacarpal joints. Diffuse soft tissue swelling. Electronically Signed   By: Fidela Salisbury M.D.   On: 06/27/2015 12:31   Dg Hand Complete Right 06/27/2015  No radiographic evidence fracture or osteomyelitis. Osteoarthritic changes of the radiocarpal and first carpometacarpal joints. Electronically Signed   By: Fidela Salisbury M.D.   On: 06/27/2015 12:28   Ct Aspiration 06/17/2015 Technically successful CT-guided aspiration of right paraspinal collection. E  Dg Chest Port 1 View 113/2017 No significant change when compared to the recent chest CT. 2. Bilateral pleural effusions with associated lung base atelectasis. No convincing pneumonia or edema. 1 cm left upper lobe spiculated nodule.   Dg Foot 2 Views Right 06/16/2015 Moderate osteopenia with diffuse heterogeneity of the marrow. This likely represents osseous metastases. Similar appearance could be seen with severe osteopenia. 2. Extensive soft tissue swelling. This may represent edema or infection. There is no underlying bone destruction.   Medical Consultants:  Oncology Dr Benay Spice ( over the phone on 1/13) , Dr Alen Blew on 1/14, primary oncologist Dr. Julien Nordmann notified on Monday 1/16. IR for paraspinous fluids collection drain (Dr. Kathlene Cote) General surgery Dr Hassell Done on 1/15 Infectious disease, Dr. Baxter Flattery   Other Consultants:  PT  IAnti-Infectives:   Florentina Jenny, MD  [facility] Pager [phone]  Time spent in minutes: 25 minutes  If 7PM-7AM, please contact night-coverage [email or web address] Password [email or web address] 07/01/2015, 12:04 PM   LOS: 15 days    HPI/Subjective: No acute overnight events. Patient reports feeling better.  Objective: Filed Vitals:   06/30/15 1130 06/30/15 1400 06/30/15 2119 07/01/15 0526  BP: 95/52 122/57 117/60 119/57  Pulse: 62 73 75 65  Temp:  94.8 F (34.9 C) 97.4 F (36.3  C) 94.4 F (34.7 C)  TempSrc:  Rectal Oral Axillary  Resp:  '11 12 15  '$ Height:      Weight:      SpO2:  99% 96% 98%    Intake/Output Summary (Last 24 hours) at 07/01/15 1204 Last data filed at 07/01/15 0659  Gross per 24 hour  Intake      0 ml  Output   1300 ml  Net  -1300 ml    Exam:   General:  Pt is alert, follows commands appropriately, not in acute distress  Cardiovascular: Regular rate and rhythm, S1/S2 (+)  Respiratory: Clear to auscultation bilaterally, no wheezing, no crackles, no rhonchi  Abdomen: Soft, non tender, non distended, bowel sounds present  Extremities: (+1) LE edema, pulses DP and PT palpable bilaterally  Neuro: Grossly nonfocal  Data Reviewed: Basic Metabolic Panel:  Recent Labs Lab 06/27/15 0524 06/28/15 0450 06/29/15 0500 06/30/15 0650 07/01/15 0336  NA 137 136 139 139 139  K 4.3 3.9 3.9 3.8 3.7  CL 110 109 110 109 108  CO2 21* 21* '22 23 23  '$ GLUCOSE 86 77 72 72 70  BUN 51* 54* 51* 50* 56*  CREATININE 1.92* 1.83* 1.84* 1.97* 1.83*  CALCIUM 7.9* 7.4* 7.3* 7.6* 7.3*  Liver Function Tests: No results for input(s): AST, ALT, ALKPHOS, BILITOT, PROT, ALBUMIN in the last 168 hours. No results for input(s): LIPASE, AMYLASE in the last 168 hours. No results for input(s): AMMONIA in the last 168 hours. CBC:  Recent Labs Lab 06/27/15 0524 06/28/15 0450 06/29/15 0500 06/30/15 0650 07/01/15 0336  WBC 7.3 6.2 4.9 4.3 4.3  HGB 8.3* 7.8* 7.3* 9.1* 9.7*  HCT 26.2* 24.9* 23.6* 28.2* 30.4*  MCV 88.5 88.9 89.4 88.1 88.9  PLT 202 209 194 214 224   Cardiac Enzymes: No results for input(s): CKTOTAL, CKMB, CKMBINDEX, TROPONINI in the last 168 hours. BNP: Invalid input(s): POCBNP CBG: No results for input(s): GLUCAP in the last 168 hours.  No results found for this or any previous visit (from the past 240 hour(s)).    Results for orders placed or performed during the hospital encounter of 06/16/15  Culture, blood (x 2)     Status: None    Collection Time: 06/16/15  4:25 PM  Result Value Ref Range Status   Specimen Description BLOOD RIGHT ARM  Final   Special Requests BOTTLES DRAWN AEROBIC AND ANAEROBIC  5CC, 4CC  Final   Culture  Setup Time   Final    GRAM NEGATIVE RODS IN BOTH AEROBIC AND ANAEROBIC BOTTLES CRITICAL RESULT CALLED TO, READ BACK BY AND VERIFIED WITH: Clarene Reamer AT [id number] ON [id number] BY Lucienne Capers    Culture   Final    KLEBSIELLA OXYTOCA ENTEROBACTER CLOACAE Performed at [facility]    Report Status 06/19/2015 FINAL  Final   Organism ID, Bacteria KLEBSIELLA OXYTOCA  Final   Organism ID, Bacteria ENTEROBACTER CLOACAE  Final      Susceptibility   Enterobacter cloacae - MIC*    CEFAZOLIN >=64 RESISTANT Resistant     CEFEPIME <=1 SENSITIVE Sensitive     CEFTAZIDIME <=1 SENSITIVE Sensitive     CEFTRIAXONE <=1 SENSITIVE Sensitive     CIPROFLOXACIN <=0.25 SENSITIVE Sensitive     GENTAMICIN <=1 SENSITIVE Sensitive     IMIPENEM 0.5 SENSITIVE Sensitive     TRIMETH/SULFA >=320 RESISTANT Resistant     PIP/TAZO <=4 SENSITIVE Sensitive     * ENTEROBACTER CLOACAE   Klebsiella oxytoca - MIC*    AMPICILLIN >=32 RESISTANT Resistant     CEFAZOLIN >=64 RESISTANT Resistant     CEFEPIME <=1 SENSITIVE Sensitive     CEFTAZIDIME <=1 SENSITIVE Sensitive     CEFTRIAXONE <=1 SENSITIVE Sensitive     CIPROFLOXACIN <=0.25 SENSITIVE Sensitive     GENTAMICIN <=1 SENSITIVE Sensitive     IMIPENEM <=0.25 SENSITIVE Sensitive     TRIMETH/SULFA <=20 SENSITIVE Sensitive     AMPICILLIN/SULBACTAM 4 SENSITIVE Sensitive     PIP/TAZO <=4 SENSITIVE Sensitive     * KLEBSIELLA OXYTOCA  Culture, blood (x 2)     Status: None   Collection Time: 06/16/15  4:40 PM  Result Value Ref Range Status   Specimen Description BLOOD LEFT ARM  Final   Special Requests IN PEDIATRIC BOTTLE 0 .5CC  Final   Culture   Final    NO GROWTH 5 DAYS Performed at [facility]    Report Status 06/21/2015 FINAL  Final  Wound culture      Status: None   Collection Time: 06/16/15  5:40 PM  Result Value Ref Range Status   Specimen Description SACRAL  Final   Special Requests NONE  Final   Gram Stain   Final    NO WBC SEEN NO  SQUAMOUS EPITHELIAL CELLS SEEN RARE GRAM POSITIVE COCCI IN PAIRS IN CLUSTERS Performed at [facility]    Culture   Final    FEW STAPHYLOCOCCUS AUREUS Note: RIFAMPIN AND GENTAMICIN SHOULD NOT BE USED AS SINGLE DRUGS FOR TREATMENT OF STAPH INFECTIONS. Performed at [facility]    Report Status 06/19/2015 FINAL  Final   Organism ID, Bacteria STAPHYLOCOCCUS AUREUS  Final      Susceptibility   Staphylococcus aureus - MIC*    CLINDAMYCIN <=0.25 SENSITIVE Sensitive     ERYTHROMYCIN <=0.25 SENSITIVE Sensitive     GENTAMICIN <=0.5 SENSITIVE Sensitive     LEVOFLOXACIN <=0.12 SENSITIVE Sensitive     OXACILLIN 0.5 SENSITIVE Sensitive     RIFAMPIN <=0.5 SENSITIVE Sensitive     TRIMETH/SULFA <=10 SENSITIVE Sensitive     VANCOMYCIN <=0.5 SENSITIVE Sensitive     TETRACYCLINE >=16 RESISTANT Resistant     MOXIFLOXACIN <=0.25 SENSITIVE Sensitive     * FEW STAPHYLOCOCCUS AUREUS  Wound culture     Status: None   Collection Time: 06/16/15  5:42 PM  Result Value Ref Range Status   Specimen Description FOOT RIGHT  Final   Special Requests NONE  Final   Gram Stain   Final    FEW WBC PRESENT,BOTH PMN AND MONONUCLEAR FEW SQUAMOUS EPITHELIAL CELLS PRESENT MODERATE GRAM NEGATIVE RODS FEW GRAM POSITIVE COCCI IN PAIRS Performed at [facility]    Culture   Final    ABUNDANT STAPHYLOCOCCUS AUREUS Note: RIFAMPIN AND GENTAMICIN SHOULD NOT BE USED AS SINGLE DRUGS FOR TREATMENT OF STAPH INFECTIONS. Performed at [facility]    Report Status 06/21/2015 FINAL  Final   Organism ID, Bacteria STAPHYLOCOCCUS AUREUS  Final      Susceptibility   Staphylococcus aureus - MIC*    CLINDAMYCIN <=0.25 SENSITIVE Sensitive     ERYTHROMYCIN <=0.25 SENSITIVE Sensitive     GENTAMICIN <=0.5 SENSITIVE  Sensitive     LEVOFLOXACIN 0.25 SENSITIVE Sensitive     OXACILLIN 0.5 SENSITIVE Sensitive     RIFAMPIN <=0.5 SENSITIVE Sensitive     TRIMETH/SULFA <=10 SENSITIVE Sensitive     VANCOMYCIN <=0.5 SENSITIVE Sensitive     TETRACYCLINE >=16 RESISTANT Resistant     MOXIFLOXACIN <=0.25 SENSITIVE Sensitive     * ABUNDANT STAPHYLOCOCCUS AUREUS  MRSA PCR Screening     Status: None   Collection Time: 06/17/15 10:10 AM  Result Value Ref Range Status   MRSA by PCR NEGATIVE NEGATIVE Final    Comment:        The GeneXpert MRSA Assay (FDA approved for NASAL specimens only), is one component of a comprehensive MRSA colonization surveillance program. It is not intended to diagnose MRSA infection nor to guide or monitor treatment for MRSA infections.   Culture, Urine     Status: None   Collection Time: 06/17/15 11:53 AM  Result Value Ref Range Status   Specimen Description URINE, RANDOM  Final   Special Requests NONE  Final   Culture   Final    >=100,000 COLONIES/mL GROUP B STREP(S.AGALACTIAE)ISOLATED TESTING AGAINST S. AGALACTIAE NOT ROUTINELY PERFORMED DUE TO PREDICTABILITY OF AMP/PEN/VAN SUSCEPTIBILITY. Performed at [facility]    Report Status 06/19/2015 FINAL  Final  Culture, routine-abscess     Status: None   Collection Time: 06/17/15  1:16 PM  Result Value Ref Range Status   Specimen Description ABSCESS RIGHT PARASPINAL LUMBAR UNDER CT  Final   Special Requests Normal  Final   Gram Stain  Final    ABUNDANT WBC PRESENT, PREDOMINANTLY PMN NO SQUAMOUS EPITHELIAL CELLS SEEN NO ORGANISMS SEEN Performed at [facility]    Culture   Final    MULTIPLE ORGANISMS PRESENT, NONE PREDOMINANT Note: NO STAPHYLOCOCCUS AUREUS ISOLATED NO GROUP A STREP (S.PYOGENES) ISOLATED Performed at [facility]    Report Status 06/20/2015 FINAL  Final  C difficile quick scan w PCR reflex     Status: None   Collection Time: 06/19/15  7:47 PM  Result Value Ref Range Status   C Diff  antigen NEGATIVE NEGATIVE Final   C Diff toxin NEGATIVE NEGATIVE Final   C Diff interpretation Negative for toxigenic C. difficile  Final     Scheduled Meds: . diphenhydrAMINE  50 mg Oral Once  . enoxaparin (LOVENOX) injection  1 mg/kg Subcutaneous Q24H  . ertapenem  500 mg Intravenous Q24H  . feeding supplement (PRO-STAT SUGAR FREE 64)  30 mL Oral BID  . furosemide  20 mg Intravenous Daily  . gabapentin  100 mg Oral QHS  . loperamide  2 mg Oral BID  . metoprolol tartrate  12.5 mg Oral BID  . pantoprazole  40 mg Oral Daily  . sodium bicarbonate  650 mg Oral BID

## 2015-07-01 NOTE — Progress Notes (Signed)
ANTICOAGULATION CONSULT NOTE - F/u Consult  Pharmacy Consult for Lovenox  Indication: DVT  Allergies  Allergen Reactions  . Amlodipine Swelling  . Ciprofloxacin Hives and Other (See Comments)    REACTION: weakness  . Mirtazapine Other (See Comments)    Hallucinations and nightmares, verbally aggressive   . Statins Other (See Comments)  . Aspirin Swelling    Mouth swelling, tongue Patient reports that she tolerates ibuprofen without problem  . Cephalexin Hives  . Hydralazine Nausea And Vomiting  . Iron Nausea And Vomiting  . Ambien [Zolpidem Tartrate] Other (See Comments)    confusion  . Lorazepam Other (See Comments)    Hallucinations, verbally aggressive  . Sulfonamide Derivatives Hives  . Zolpidem     Confusion  . Penicillins Other (See Comments)    Has patient had a PCN reaction causing immediate rash, facial/tongue/throat swelling, SOB or lightheadedness with hypotension: Yes Has patient had a PCN reaction causing severe rash involving mucus membranes or skin necrosis: Yes Has patient had a PCN reaction that required hospitalization No Has patient had a PCN reaction occurring within the last 10 years: No If all of the above answers are "NO", then may proceed with Cephalosporin use.   . Shellfish Allergy Hives and Rash  . Tape Rash    Patient Measurements: Height: '5\' 8"'$  (172.7 cm) Weight: 132 lb 4.4 oz (60 kg) IBW/kg (Calculated) : 63.9 Heparin Dosing Weight: using actual body weight of 60 kg  Vital Signs: Temp: 94.4 F (34.7 C) (01/28 0526) Temp Source: Axillary (01/28 0526) BP: 119/57 mmHg (01/28 0526) Pulse Rate: 65 (01/28 0526)  Labs:  Recent Labs  06/29/15 0500 06/30/15 0650 07/01/15 0336  HGB 7.3* 9.1* 9.7*  HCT 23.6* 28.2* 30.4*  PLT 194 214 224  CREATININE 1.84* 1.97* 1.83*    Estimated Creatinine Clearance: 23.6 mL/min (by C-G formula based on Cr of 1.83).   Medical History: Past Medical History  Diagnosis Date  . Hypertension   .  Hypercholesterolemia   . CKD (chronic kidney disease)   . Coronary artery disease     a.  LHC (06/04/05): LHC done in [place] with high grade RCA => s/p BMS to RCA;  b.  Nuclear (09/14/09): Lexiscan; Inf infarct with mild peri-infarct ishemia, EF 52%; Low Risk.  Marland Kitchen GERD (gastroesophageal reflux disease)   . Chronic anemia   . Ischemic cardiomyopathy     a. Echo (07/26/13): Mild LVH, EF 35-40%, diff HK, inf AK, Gr 2 DD, Tr AI, mildly dilated Ao root, MAC, mild MR, mild LAE, mod reduced RVSF.  . Non-small cell carcinoma of lung ([place])     Stage IV  . Chronic fatigue 04/04/2015  . Chronic fatigue 04/04/2015    Medications:  Scheduled:  . diphenhydrAMINE  50 mg Oral Once  . enoxaparin (LOVENOX) injection  1 mg/kg Subcutaneous Q24H  . ertapenem  500 mg Intravenous Q24H  . feeding supplement (PRO-STAT SUGAR FREE 64)  30 mL Oral BID  . furosemide  20 mg Intravenous Daily  . gabapentin  100 mg Oral QHS  . loperamide  2 mg Oral BID  . metoprolol tartrate  12.5 mg Oral BID  . pantoprazole  40 mg Oral Daily  . sodium bicarbonate  650 mg Oral BID  . sodium chloride  10-40 mL Intracatheter Q12H  . sodium chloride  3 mL Intravenous Q12H    Assessment: Pt is a 82 yo female with metastatic NSCLC, now diagnosed with DVT noted in the right common femoral vein,  right femoral vein, right profunda femoral vein, right popliteal vein, right proximal peroneal veins as well as R upper extremity . Pt was previously on enoxaparin PTA but reportedly has not been on medication for greater than 30 days. Patient's currently on anticoagulation for treatment of thrombi.  Hx of CKD noted.   Significant Events: 1/14 am paraspinal fluid aspiration by IR 1/14 pm heparin infusion initiated 1/17: pt received PRBC 1/21: transitioned from IV heparin to full-dose Lovenox ('1mg'$ /kg SQ q24h due to CrCl < 30 mL/min)   Today, 07/01/2015:  Lovenox 60 mg ('1mg'$ /kg) SQ q24h  No bleeding reported in most recent chart  note.  Hgb remains low but stable  Pltc remains WNL.   SCr elevated, stable; CrCl remains < 30 mL/min . Goal of Therapy: Full dose Lovenox with dosage adjusted for renal function Monitor platelets by anticoagulation protocol: Yes  Plan:  Continue Lovenox 60 mg ('1mg'$ /kg) SQ q24h  Monitor renal function, H/H, pltc, clinical course.  Dia Sitter, PharmD, BCPS 07/01/2015 9:26 AM      .

## 2015-07-02 DIAGNOSIS — L0291 Cutaneous abscess, unspecified: Secondary | ICD-10-CM

## 2015-07-02 DIAGNOSIS — M4620 Osteomyelitis of vertebra, site unspecified: Secondary | ICD-10-CM

## 2015-07-02 MED ORDER — FUROSEMIDE 20 MG PO TABS: 20.0000 mg | ORAL_TABLET | Freq: Every day | ORAL | Status: DC

## 2015-07-02 MED ORDER — ENOXAPARIN SODIUM 60 MG/0.6ML ~~LOC~~ SOLN: 1.0000 mg/kg | SUBCUTANEOUS | Status: DC

## 2015-07-02 MED ORDER — SODIUM CHLORIDE 0.9 % IV SOLN: 500.0000 mg | INTRAVENOUS | Status: DC

## 2015-07-02 MED ORDER — LOPERAMIDE HCL 2 MG PO CAPS: 2.0000 mg | ORAL_CAPSULE | Freq: Four times a day (QID) | ORAL | Status: DC | PRN

## 2015-07-02 MED ORDER — GABAPENTIN 100 MG PO CAPS
100.0000 mg | ORAL_CAPSULE | Freq: Every day | ORAL | Status: DC
Start: 2015-07-02 — End: 2015-12-07

## 2015-07-02 MED ORDER — SODIUM BICARBONATE 650 MG PO TABS: 650.0000 mg | ORAL_TABLET | Freq: Every day | ORAL | Status: DC

## 2015-07-02 MED ORDER — METOPROLOL TARTRATE 25 MG PO TABS: 12.5000 mg | ORAL_TABLET | Freq: Two times a day (BID) | ORAL | Status: DC

## 2015-07-02 MED ORDER — PRO-STAT SUGAR FREE PO LIQD: 30.0000 mL | Freq: Two times a day (BID) | ORAL | Status: DC

## 2015-07-02 NOTE — Discharge Summary (Addendum)
Physician Discharge Summary  YURI FLENER YIR:[id number] DOB: 1933/11/27 DOA: 06/16/2015  PCP: Wende Neighbors, MD  Admit date: 06/16/2015 Discharge date: 07/03/2015  Recommendations for Outpatient Follow-up:  Continue ertapenem for 14 days starting 07/02/2015 through 07/16/2015 Pt needs repeat blood cultures after completion of antibiotic to make sure bacteremia cleared. If still bacteremic then port-a-cath needs to come out.  Recheck kidney function in next week to make sure creatinine stable. Creatinine 1.8 prior to discharge. Continue Lovenox for DVT.   Addendum: for lip swelling we ordered prednisone 50 mg taper. Taper down prednisone starting from 50 mg a day, taper down by 5 mg a day down to 0 mg. for ex, today 50 mg, tomorrow 45 mg, then 40 mg the following day and etc... Use Pepcid 20 mg daily for 10 days while on prednisone. Use benadryl as needed.  Wound care  Chronic, non-healing Stage 4 wound to sacrum, Stage 3 to right hip and right lateral malleolus Wound type:pressure Pressure Ulcer POA: Yes Measurement: Sacral Stage 4: 5.5cm x 5.5cm x 5.5cm in center tunnel and 0.4cm overall depth. Right hip: 4cm x 2cm area with two Stage 3 pressure injuries within. The distal wound is the largest and measures 2cm x 2xm x 0.2cm. Right lateral malleolus, Stage 3: 0.5cm round x 0.2cm deep. Wound JKK:XFGH pink, moist Drainage (amount, consistency, odor) Large amount of light yellow exudate from sacrum; small amounts of light yellow exudate from right hip and right lateral malleolus Periwound:Intact, dry Dressing procedure/placement/frequency: Orders are provided for Nursing to pad, protect the right hip and right lateral malleolus. For the sacral wound, fill the defect with a ribbon of silver hydrofiber (Aquacel Ag+), top the wound will silver hydrofiber and cover all with a soft silicone foam dressing. Pt has therapeutic mattress in hospital, please continue this in SNF.Bilateral pressure  redistribution heel boots are provided.   Discharge Diagnoses:  Principal Problem:   Sepsis ([place]) Active Problems:   Pressure ulcer   Lung cancer stage IV with Metastatic squamous cell carcinoma to bone (HCC)   Hypotension   Acute renal failure superimposed on stage 3 chronic kidney disease (HCC)   Anemia of chronic disease   Hypokalemia   Hypothermia   Paraspinal abscess (HCC)   Malnutrition of moderate degree   Edema   Right leg DVT (HCC)   Diarrhea   Abscess   Right foot ulcer ([place])   Discharge Condition: stable   Diet recommendation: as tolerated   History of present illness:  82 year old female with past medical history of hypertension, CAD s/p stent in 2007, h/o chronic systolic chf, not a cnadidate for ace/arb due to CKD stage IV, she has been taken off blood pressure meds in 03/2015 due to progressive weakness and low blood pressure, h/o stage IV NSCLC initially diagnosed in 2007, with disease recurrent in 11/2014, s/p XRT to right paraspinous mass with osseous invasion at L5 , finished on 12/23/2014. Pt was then treated with systemic chemotherapy with [place] and paclitaxel which was stopped after two cycles due to intolerance and mild disease progression. Currently she is on immunotherapy with PD1 inhibitor, last on 12/23. She was sent from oncology clinic for evaluation of hypothermia and hypotension and diarrhea for the last two weeks prior to the admission.  Hospital Course:   Assessment/Plan:    Principal Problem: Sepsis due to infected sacral wound / Paraspinal abscess / Streptococcus UTI / hypothermia - Sepsis criteria met on admission with multiple sources of infections as a  possibility: sacral wound, right hip wound, paraspinal abscess, UTI. Urine culture showed strep agalactiae. - Pt has polymicrobial paraspinal abscess, s/p aspiration, wound colonized with MSSA, per ID recommend plan is to treat for total 28 days so starting 07/02/2015 she needs 14 more days of  ertapenem - Per ID, portacath may also be seeded with klebsiella and enterobacter, though only 1 of 2 sets +. Repeat blood cultures once abx treatment completed. If blood cultures positive then PAC needs to come out    Active Problems: Klebsiella and enterococcus bacteremia - Seen on Blood culture 06/15/2014 - Repeat blood cultures 1/28 and pending, will have to follow up in SNF  Right leg wound - Right leg xray with extensive soft tissue swelling, edema vs infection, marked osteropenia? Osseous metastases - Continue with wound care   Diarrhea - Improved with imodium   Bilateral pleural effusion - 2 D ECHO LV EF 50-55% with grade I diastolic dysfunction - Stable resp status   Acute on chronic kidney disease, stage III/IV with history of right sided hydronephrosis - S/p percutaneous nephrostomy tube in 11/2014 which later was removed  - Cr 1.8 prior to discharge   Bilateral pedal edema - Pt on lasix 20 mg IV daily but will change to po lasix 20 mg daily   DVT history  - DVT in right common femoral vein, right femoral vein, right profunda femoral vein, right popliteal vein, and right proximal peroneal veins.. - 1/14 started heparin drip and transitioned to Lovenox subsequently per Dr. Julien Nordmann recommendations  - Continue Lovenox   RUE DVT, jugular vein - Noted on US doppler 06/29/2015 - Continue Lovenox   Stage IV lung cancer, metastatic  - Dr Julien Nordmann consulted, appreciate recommendations - Otpt follow up  Anemia of chronic disease - Secondary to history of malignancy - Has required 1 Unit PRBC 06/29/2015  Hypokalemia - Due to GI losses, diarrhea and lasix  - Supplemented   CAD s/p stent in 2007 - Stable - No chest pain   Essential hypertension - On metoprolol 12.5 mg PO BID  New onset of afib  - CHADS vasc score 4 - Noted on 1/16 and 1/17 - On AC with Lovenox - Rate controlled with metoprolol   Moderate protein calorie malnutrition - In the context of  chronic illnesses  DVT Prophylaxis  - Lovenox subQ   Code Status: Full.  Family Communication: plan of care discussed with the patient   IV access:  Peripheral IV  Procedures and diagnostic studies:   UE doppler 06/29/2015 - RUE DVT in right internal jugular vein   Dg Wrist Complete Right 06/27/2015 No evidence of fracture or osteomyelitis radiographically. Osteoarthritic changes of the radiocarpal and first carpometacarpal joints. Diffuse soft tissue swelling. Electronically Signed By: Fidela Salisbury M.D. On: 06/27/2015 12:31   Dg Hand Complete Right 06/27/2015 No radiographic evidence fracture or osteomyelitis. Osteoarthritic changes of the radiocarpal and first carpometacarpal joints. Electronically Signed By: Fidela Salisbury M.D. On: 06/27/2015 12:28   Ct Aspiration 06/17/2015 Technically successful CT-guided aspiration of right paraspinal collection. E  Dg Chest Port 1 View 113/2017 No significant change when compared to the recent chest CT. 2. Bilateral pleural effusions with associated lung base atelectasis. No convincing pneumonia or edema. 1 cm left upper lobe spiculated nodule.   Dg Foot 2 Views Right 06/16/2015 Moderate osteopenia with diffuse heterogeneity of the marrow. This likely represents osseous metastases. Similar appearance could be seen with severe osteopenia. 2. Extensive soft tissue swelling. This may represent edema or  infection. There is no underlying bone destruction.   Medical Consultants:  Oncology Dr Benay Spice ( over the phone on 1/13) , Dr Alen Blew on 1/14, primary oncologist Dr. Julien Nordmann notified on Monday 1/16. IR for paraspinous fluids collection drain (Dr. Kathlene Cote) General surgery Dr Hassell Done on 1/15 Infectious disease, Dr. Baxter Flattery   Other Consultants:  PT  IAnti-Infectives:   Colbert Ewing    Signed:  Leisa Lenz, MD  [facility] 07/02/2015, 1:24 PM  Pager #: [phone]  Time spent in minutes: more than  30 minutes  Discharge Exam: Filed Vitals:   07/02/15 0257 07/02/15 0540  BP: 91/51 104/51  Pulse: 63 65  Temp:  97.5 F (36.4 C)  Resp:  17   Filed Vitals:   07/01/15 1421 07/01/15 2216 07/02/15 0257 07/02/15 0540  BP: 114/55 123/43 91/51 104/51  Pulse: 60 69 63 65  Temp:  97.5 F (36.4 C)  97.5 F (36.4 C)  TempSrc:  Oral  Oral  Resp: '18 18  17  '$ Height:      Weight:      SpO2: 100% 96%  98%    General: Pt is alert, follows commands appropriately, not in acute distress Cardiovascular: Regular rate and rhythm, S1/S2 + Respiratory: Clear to auscultation bilaterally, no wheezing, no crackles, no rhonchi Abdominal: Soft, non tender, non distended, bowel sounds +, no guarding Extremities:  no cyanosis, pulses palpable bilaterally DP and PT Neuro: Grossly nonfocal  Discharge Instructions  Discharge Instructions    Call MD for:  difficulty breathing, headache or visual disturbances    Complete by:  As directed      Call MD for:  persistant dizziness or light-headedness    Complete by:  As directed      Call MD for:  persistant nausea and vomiting    Complete by:  As directed      Call MD for:  severe uncontrolled pain    Complete by:  As directed      Diet - low sodium heart healthy    Complete by:  As directed      Discharge instructions    Complete by:  As directed   Continue ertapenem for 14 days starting 07/02/2015. Recheck kidney function in next week to make sure creatinine stable. Creatinine 1.8 prior to discharge.     Increase activity slowly    Complete by:  As directed             Medication List    STOP taking these medications        HYDROcodone-acetaminophen 5-325 MG tablet  Commonly known as:  NORCO/VICODIN     methylPREDNISolone 4 MG Tbpk tablet  Commonly known as:  MEDROL DOSEPAK      TAKE these medications        acetaminophen 500 MG tablet  Commonly known as:  TYLENOL  Take 500 mg by mouth daily as needed for mild pain.      diphenhydrAMINE 25 mg capsule  Commonly known as:  BENADRYL  Take 1 capsule (25 mg total) by mouth every 6 (six) hours as needed.     diphenoxylate-atropine 2.5-0.025 MG tablet  Commonly known as:  LOMOTIL  Take 1 tablet by mouth 4 (four) times daily as needed for diarrhea or loose stools.     enoxaparin 60 MG/0.6ML injection  Commonly known as:  LOVENOX  Inject 0.6 mLs (60 mg total) into the skin daily.     ertapenem 0.5 g in sodium chloride 0.9 % 50 mL  Inject 0.5 g into the vein daily.     famotidine 20 MG tablet  Commonly known as:  PEPCID  Take 1 tablet (20 mg total) by mouth daily.     feeding supplement (PRO-STAT SUGAR FREE 64) Liqd  Take 30 mLs by mouth 2 (two) times daily.     furosemide 20 MG tablet  Commonly known as:  LASIX  Take 1 tablet (20 mg total) by mouth daily.     gabapentin 100 MG capsule  Commonly known as:  NEURONTIN  Take 1 capsule (100 mg total) by mouth at bedtime.     lidocaine-prilocaine cream  Commonly known as:  EMLA  Apply 1 application topically as needed.     loperamide 2 MG capsule  Commonly known as:  IMODIUM  Take 1 capsule (2 mg total) by mouth every 6 (six) hours as needed for diarrhea or loose stools.     metoprolol tartrate 25 MG tablet  Commonly known as:  LOPRESSOR  Take 0.5 tablets (12.5 mg total) by mouth 2 (two) times daily.     pantoprazole 40 MG tablet  Commonly known as:  PROTONIX  TAKE ONE TABLET BY MOUTH ONCE DAILY.     potassium chloride SA 20 MEQ tablet  Commonly known as:  K-DUR,KLOR-CON  Take 1 tablet (20 mEq total) by mouth daily.     predniSONE 5 MG tablet  Commonly known as:  DELTASONE  Take 10 tablets (50 mg total) by mouth daily with breakfast.     SANTYL ointment  Generic drug:  collagenase  Apply 1 application topically at bedtime as needed. For bed sores     sodium bicarbonate 650 MG tablet  Take 1 tablet (650 mg total) by mouth daily.            Follow-up Information    Follow up with  Wende Neighbors, MD. Schedule an appointment as soon as possible for a visit in 1 week.   Specialty:  Internal Medicine   Why:  Follow up appt after recent hospitalization   Contact information:   [place] [address] [phone]        The results of significant diagnostics from this hospitalization (including imaging, microbiology, ancillary and laboratory) are listed below for reference.    Significant Diagnostic Studies: Ct Abdomen Pelvis Wo Contrast  06/14/2015  CLINICAL DATA:  Lung cancer, chemotherapy in progress. Radiation therapy complete. Nausea. EXAM: CT CHEST, ABDOMEN AND PELVIS WITHOUT CONTRAST TECHNIQUE: Multidetector CT imaging of the chest, abdomen and pelvis was performed following the standard protocol without IV contrast. COMPARISON:  03/29/2015. FINDINGS: CT CHEST FINDINGS Mediastinum/Lymph Nodes: Right IJ Port-A-Cath terminates in the low SVC. Ascending aorta measures 4.2 cm. Three-vessel coronary artery calcification. Heart size normal. No pericardial effusion. Mediastinal lymph nodes are not enlarged by CT size criteria. Hilar regions are difficult to definitively evaluate without IV contrast. No axillary adenopathy. Lungs/Pleura: Small to moderate bilateral effusions are new. Collapse/consolidation, with internal calcification, in the right lower lobe, new. Mild compressive atelectasis in the left lower lobe. Moderate centrilobular emphysema. Spiculated left upper lobe nodule measures 10 mm, previously 12 mm. Minimal volume loss in the lingula. Nodularity seen in the posterior right lung on the prior exam is less well visualized, due to adjacent pleural fluid and collapse/consolidation. Musculoskeletal: No worrisome lytic or sclerotic lesions. Healed anterior right rib fracture. CT ABDOMEN PELVIS FINDINGS Hepatobiliary: Liver is unremarkable. Cholecystectomy. No biliary ductal dilatation. Pancreas: Negative. Spleen: Negative. Adrenals/Urinary Tract: Adrenal  glands  are unremarkable. A somewhat heterogeneous low-attenuation lesion in the upper pole right kidney measures approximately 1.6 cm, stable in size but indeterminate without post-contrast imaging. Double-J right ureteral stent in satisfactory position. Bilateral renal atrophy. Left ureter is decompressed. Bladder is otherwise unremarkable. Stomach/Bowel: Stomach is unremarkable. Duodenal diverticula incidentally noted. Small bowel and colon are otherwise unremarkable. Appendectomy. Vascular/Lymphatic: Atherosclerotic calcification of the arterial vasculature. Suprarenal aorta measures 3.0 cm, stable. No pathologically enlarged lymph nodes. Reproductive: Hysterectomy. Air is seen in the vaginal cuff. Ovaries are not well-visualized. Other: Scattered free fluid. Minimal presacral edema, stable. Mesenteries and peritoneum are otherwise unremarkable. A thick walled collection of fluid and air is seen in the right paraspinous musculature, overlying the right L3 transverse process, measuring 2.4 cm in greatest transaxial dimension. Fluid extends superiorly to the level of L1-2, and inferiorly overlying the right sacrum (series 2, images 64 to 99). This is largely new from 03/29/2015. Musculoskeletal: Right sacral soft tissue mass measures 4.6 x 7.0 cm, stable. Lytic lesions are also seen in the L4 and L5 vertebral bodies, as before. Associated L5 compression fracture, stable. Mild compression of the T11 superior endplate, stable. IMPRESSION: 1. New small to moderate bilateral effusions. Associated collapse/consolidation in the right lower lobe, possibly due to pneumonia. Underlying disease recurrence cannot be excluded. 2. Spiculated left upper lobe nodule is stable to minimally decreased in size. Additional scattered pulmonary nodules in the right lung are better seen on the prior exam. 3. Stable osseous metastatic disease. 4. Thick walled collection of fluid and air in the paraspinous musculature overlying the right  aspect of the lumbar spine, largely new from 03/29/2015. Finding may be due to an abscess. Please correlate clinically. 5. Heterogeneous low-attenuation lesion in the upper pole right kidney, stable but indeterminate. 6. Ascending aortic aneurysm. Small suprarenal abdominal aortic aneurysm. 7. Scattered ascites. Electronically Signed   By: Leanna Battles M.D.   On: 06/14/2015 09:05   Dg Wrist Complete Right  06/27/2015  CLINICAL DATA:  Swelling and tenderness of the right hand and wrist. EXAM: RIGHT WRIST - COMPLETE 3+ VIEW COMPARISON:  None. FINDINGS: There is no evidence of fracture or dislocation. There are osteoarthritic changes of the radiocarpal and first carpometacarpal joints with joint space narrowing, subchondral sclerosis and osteophyte formation. No evidence of osteolytic changes. Osseous mineralization is normal. There is diffuse soft tissue swelling. IMPRESSION: No evidence of fracture or osteomyelitis radiographically. Osteoarthritic changes of the radiocarpal and first carpometacarpal joints. Diffuse soft tissue swelling. Electronically Signed   By: Ted Mcalpine M.D.   On: 06/27/2015 12:31   Ct Chest Wo Contrast  06/14/2015  CLINICAL DATA:  Lung cancer, chemotherapy in progress. Radiation therapy complete. Nausea. EXAM: CT CHEST, ABDOMEN AND PELVIS WITHOUT CONTRAST TECHNIQUE: Multidetector CT imaging of the chest, abdomen and pelvis was performed following the standard protocol without IV contrast. COMPARISON:  03/29/2015. FINDINGS: CT CHEST FINDINGS Mediastinum/Lymph Nodes: Right IJ Port-A-Cath terminates in the low SVC. Ascending aorta measures 4.2 cm. Three-vessel coronary artery calcification. Heart size normal. No pericardial effusion. Mediastinal lymph nodes are not enlarged by CT size criteria. Hilar regions are difficult to definitively evaluate without IV contrast. No axillary adenopathy. Lungs/Pleura: Small to moderate bilateral effusions are new. Collapse/consolidation, with  internal calcification, in the right lower lobe, new. Mild compressive atelectasis in the left lower lobe. Moderate centrilobular emphysema. Spiculated left upper lobe nodule measures 10 mm, previously 12 mm. Minimal volume loss in the lingula. Nodularity seen in the posterior right lung on the prior exam  is less well visualized, due to adjacent pleural fluid and collapse/consolidation. Musculoskeletal: No worrisome lytic or sclerotic lesions. Healed anterior right rib fracture. CT ABDOMEN PELVIS FINDINGS Hepatobiliary: Liver is unremarkable. Cholecystectomy. No biliary ductal dilatation. Pancreas: Negative. Spleen: Negative. Adrenals/Urinary Tract: Adrenal glands are unremarkable. A somewhat heterogeneous low-attenuation lesion in the upper pole right kidney measures approximately 1.6 cm, stable in size but indeterminate without post-contrast imaging. Double-J right ureteral stent in satisfactory position. Bilateral renal atrophy. Left ureter is decompressed. Bladder is otherwise unremarkable. Stomach/Bowel: Stomach is unremarkable. Duodenal diverticula incidentally noted. Small bowel and colon are otherwise unremarkable. Appendectomy. Vascular/Lymphatic: Atherosclerotic calcification of the arterial vasculature. Suprarenal aorta measures 3.0 cm, stable. No pathologically enlarged lymph nodes. Reproductive: Hysterectomy. Air is seen in the vaginal cuff. Ovaries are not well-visualized. Other: Scattered free fluid. Minimal presacral edema, stable. Mesenteries and peritoneum are otherwise unremarkable. A thick walled collection of fluid and air is seen in the right paraspinous musculature, overlying the right L3 transverse process, measuring 2.4 cm in greatest transaxial dimension. Fluid extends superiorly to the level of L1-2, and inferiorly overlying the right sacrum (series 2, images 64 to 99). This is largely new from 03/29/2015. Musculoskeletal: Right sacral soft tissue mass measures 4.6 x 7.0 cm, stable. Lytic  lesions are also seen in the L4 and L5 vertebral bodies, as before. Associated L5 compression fracture, stable. Mild compression of the T11 superior endplate, stable. IMPRESSION: 1. New small to moderate bilateral effusions. Associated collapse/consolidation in the right lower lobe, possibly due to pneumonia. Underlying disease recurrence cannot be excluded. 2. Spiculated left upper lobe nodule is stable to minimally decreased in size. Additional scattered pulmonary nodules in the right lung are better seen on the prior exam. 3. Stable osseous metastatic disease. 4. Thick walled collection of fluid and air in the paraspinous musculature overlying the right aspect of the lumbar spine, largely new from 03/29/2015. Finding may be due to an abscess. Please correlate clinically. 5. Heterogeneous low-attenuation lesion in the upper pole right kidney, stable but indeterminate. 6. Ascending aortic aneurysm. Small suprarenal abdominal aortic aneurysm. 7. Scattered ascites. Electronically Signed   By: Lorin Picket M.D.   On: 06/14/2015 09:05   Ct Aspiration  06/17/2015  CLINICAL DATA:  Metastatic lung carcinoma post chemotherapy and radiation therapy. Recent CT demonstrated new small right paraspinal collection containing gas and fluid. EXAM: CT GUIDED ASPIRATION BIOPSY OF RIGHT PARASPINAL COLLECTION ANESTHESIA/SEDATION: Intravenous Fentanyl and Versed were administered as conscious sedation during continuous cardiorespiratory monitoring by the radiology RN, with a total moderate sedation time of 6 minutes. PROCEDURE: The procedure risks, benefits, and alternatives were explained to the patient. Questions regarding the procedure were encouraged and answered. The patient understands and consents to the procedure. Patient placed in left lateral decubitus positioning. Select axial scans through the lower abdomen performed. The collection was localized and an appropriate skin entry site was determined and marked. The  operative field was prepped with Betadinein a sterile fashion, and a sterile drape was applied covering the operative field. A sterile gown and sterile gloves were used for the procedure. Local anesthesia was provided with 1% Lidocaine. Under CT fluoroscopic guidance, 18 gauge percutaneous entry needle was advanced to the collection. Once needle tip position was confirmed, aspiration returned to mild purulent material. This was sent for routine Gram stain, culture and sensitivity. Postprocedure scan shows no hemorrhage or other apparent complication. The patient tolerated the procedure well. COMPLICATIONS: None immediate FINDINGS: Limited CT again demonstrates right paraspinal the collection containing gas  and fluid. CT-guided aspiration was performed, the aspirate sent for Gram stain and culture. IMPRESSION: 1. Technically successful CT-guided aspiration of right paraspinal collection. Electronically Signed   By: Lucrezia Europe M.D.   On: 06/17/2015 13:55   Dg Chest Port 1 View  06/16/2015  CLINICAL DATA:  Sepsis. History of hypertension, coronary artery disease ischemic cardiomyopathy. History of carcinoma of the lung. EXAM: PORTABLE CHEST 1 VIEW COMPARISON:  Chest CT, 06/14/2015.  Chest radiographs, 03/17/2015. FINDINGS: Opacity at the lung bases, obscuring hemidiaphragms, reflects the bilateral effusions and associated atelectasis noted on recent chest CT. No convincing pulmonary edema. Small spiculated nodule in the left upper lobe, measuring 1 cm on the prior CT, is again noted. No convincing pneumonia. No pulmonary edema. No pneumothorax. Right anterior chest wall Port-A-Cath is stable with its tip in the lower superior vena cava. Cardiac silhouette is normal in size. No convincing mediastinal or hilar masses or enlarged lymph nodes. IMPRESSION: 1. No significant change when compared to the recent chest CT. 2. Bilateral pleural effusions with associated lung base atelectasis. No convincing pneumonia or edema.  1 cm left upper lobe spiculated nodule. Electronically Signed   By: Lajean Manes M.D.   On: 06/16/2015 16:01   Dg Hand Complete Right  06/27/2015  CLINICAL DATA:  Pain and swelling of the right hand and wrist for about 2 weeks. EXAM: RIGHT HAND - COMPLETE 3+ VIEW COMPARISON:  03/17/2014 FINDINGS: There is no evidence of fracture or dislocation. Again seen osteoarthritic changes with narrowing of the radiocarpal joint space and first carpometacarpal joint, with subchondral sclerosis and osteophyte formation. The findings are chronic. No evidence of cortical destruction to suggest osteomyelitis. There is diffuse soft tissue swelling. IMPRESSION: No radiographic evidence fracture or osteomyelitis. Osteoarthritic changes of the radiocarpal and first carpometacarpal joints. Electronically Signed   By: Fidela Salisbury M.D.   On: 06/27/2015 12:28   Dg Foot 2 Views Right  06/16/2015  CLINICAL DATA:  Ulcer to the right lateral aspect of the foot and ankle. Lung cancer. EXAM: RIGHT FOOT - 2 VIEW COMPARISON:  CT of the abdomen pelvis. FINDINGS: Marked osteopenia is present. There is diffuse heterogeneity of the bone likely reflecting metastases. Is extensive soft tissue swelling is present over the distal foot. No focal osseous destruction is present. The ulceration is not discretely visualized. IMPRESSION: 1. Moderate osteopenia with diffuse heterogeneity of the marrow. This likely represents osseous metastases. Similar appearance could be seen with severe osteopenia. 2. Extensive soft tissue swelling. This may represent edema or infection. There is no underlying bone destruction. Electronically Signed   By: San Morelle M.D.   On: 06/16/2015 16:04    Microbiology: Recent Results (from the past 240 hour(s))  Culture, blood (routine x 2)     Status: None (Preliminary result)   Collection Time: 07/01/15  1:45 PM  Result Value Ref Range Status   Specimen Description BLOOD LEFT ANTECUBITAL  Final    Special Requests   Final    BOTTLES DRAWN AEROBIC AND ANAEROBIC 10 CC Performed at [facility]    Culture PENDING  Incomplete   Report Status PENDING  Incomplete  Culture, blood (routine x 2)     Status: None (Preliminary result)   Collection Time: 07/01/15  1:55 PM  Result Value Ref Range Status   Specimen Description BLOOD LEFT ANTECUBITAL  Final   Special Requests   Final    BOTTLES DRAWN AEROBIC AND ANAEROBIC 10CC Performed at [facility]  Culture PENDING  Incomplete   Report Status PENDING  Incomplete     Labs: Basic Metabolic Panel:  Recent Labs Lab 06/27/15 0524 06/28/15 0450 06/29/15 0500 06/30/15 0650 07/01/15 0336  NA 137 136 139 139 139  K 4.3 3.9 3.9 3.8 3.7  CL 110 109 110 109 108  CO2 21* 21* '22 23 23  '$ GLUCOSE 86 77 72 72 70  BUN 51* 54* 51* 50* 56*  CREATININE 1.92* 1.83* 1.84* 1.97* 1.83*  CALCIUM 7.9* 7.4* 7.3* 7.6* 7.3*   Liver Function Tests: No results for input(s): AST, ALT, ALKPHOS, BILITOT, PROT, ALBUMIN in the last 168 hours. No results for input(s): LIPASE, AMYLASE in the last 168 hours. No results for input(s): AMMONIA in the last 168 hours. CBC:  Recent Labs Lab 06/27/15 0524 06/28/15 0450 06/29/15 0500 06/30/15 0650 07/01/15 0336  WBC 7.3 6.2 4.9 4.3 4.3  HGB 8.3* 7.8* 7.3* 9.1* 9.7*  HCT 26.2* 24.9* 23.6* 28.2* 30.4*  MCV 88.5 88.9 89.4 88.1 88.9  PLT 202 209 194 214 224   Cardiac Enzymes: No results for input(s): CKTOTAL, CKMB, CKMBINDEX, TROPONINI in the last 168 hours. BNP: BNP (last 3 results) No results for input(s): BNP in the last 8760 hours.  ProBNP (last 3 results) No results for input(s): PROBNP in the last 8760 hours.  CBG: No results for input(s): GLUCAP in the last 168 hours.

## 2015-07-02 NOTE — Progress Notes (Signed)
Spoke with pt- pt agreeable to short term SNF if daughter thinks this is best course of action- CSW left messages on dtrs cell and home phones- awaiting return call  Bed search initiated since pt is agreeable  CSW will continue to follow.   Domenica Reamer, [place] Social Worker [phone]

## 2015-07-02 NOTE — Clinical Social Work Placement (Signed)
   CLINICAL SOCIAL WORK PLACEMENT  NOTE  Date:  07/02/2015  Patient Details  Name: [name] MRN: [id number] Date of Birth: 03/28/1934  Clinical Social Work is seeking post-discharge placement for this patient at the [place] level of care (*CSW will initial, date and re-position this form in  chart as items are completed):  Yes   Patient/family provided with [place] Work Department's list of facilities offering this level of care within the geographic area requested by the patient (or if unable, by the patient's family).  Yes   Patient/family informed of their freedom to choose among providers that offer the needed level of care, that participate in Medicare, Medicaid or managed care program needed by the patient, have an available bed and are willing to accept the patient.  Yes   Patient/family informed of [facility]'s ownership interest in [facility] and [facility], as well as of the fact that they are under no obligation to receive care at these facilities.  PASRR submitted to EDS on       PASRR number received on       Existing PASRR number confirmed on 07/02/15     FL2 transmitted to all facilities in geographic area requested by pt/family on 07/02/15     FL2 transmitted to all facilities within larger geographic area on       Patient informed that his/her managed care company has contracts with or will negotiate with certain facilities, including the following:            Patient/family informed of bed offers received.  Patient chooses bed at       Physician recommends and patient chooses bed at      Patient to be transferred to   on  .  Patient to be transferred to facility by       Patient family notified on   of transfer.  Name of family member notified:        PHYSICIAN Please sign FL2     Additional Comment:    _______________________________________________ Cranford Mon, LCSW 07/02/2015, 3:19  PM

## 2015-07-02 NOTE — Progress Notes (Signed)
Please be aware that pt and family do NOT want pt placed at [facility] in [place]. Also, that pt should not be given Ativan unless at HS for sleep.

## 2015-07-02 NOTE — NC FL2 (Signed)
[place] LEVEL OF CARE SCREENING TOOL     IDENTIFICATION  Patient Name: [name] Birthdate: Apr 10, 1934 Sex: female Admission Date (Current Location): 06/16/2015  [facility] and [place] Number:  Engineer, manufacturing systems and Address:  [facility],  [place] [address] [place]      Provider Number: [id number]  Attending Physician Name and Address:  Robbie Lis, MD  Relative Name and Phone Number:       Current Level of Care: Hospital Recommended Level of Care: [place] Prior Approval Number:    Date Approved/Denied:   PASRR Number: [id number]  Discharge Plan: SNF    Current Diagnoses: Patient Active Problem List   Diagnosis Date Noted  . Right foot ulcer ([place])   . Abscess   . Right leg DVT ([place]) 06/24/2015  . Diarrhea 06/24/2015  . Edema   . Sepsis ([place])   . Malnutrition of moderate degree 06/18/2015  . Hypothermia 06/16/2015  . Paraspinal abscess ([place])   . Acute renal failure superimposed on stage 3 chronic kidney disease ([place]) 03/17/2015  . Anemia of chronic disease 03/17/2015  . Hypokalemia 03/17/2015  . Hypotension 02/23/2015  . Lung cancer stage IV with Metastatic squamous cell carcinoma to bone ([place]) 11/28/2014  . Pressure ulcer 11/24/2014    Orientation RESPIRATION BLADDER Height & Weight    Self, Time, Situation, Place  Normal Incontinent, Indwelling catheter '5\' 8"'$  (172.7 cm) 132 lbs.  BEHAVIORAL SYMPTOMS/MOOD NEUROLOGICAL BOWEL NUTRITION STATUS      Incontinent Diet (regular)  AMBULATORY STATUS COMMUNICATION OF NEEDS Skin   Limited Assist Verbally PU Stage and Appropriate Care, Surgical wounds, Other (Comment) (PU on sacrum unspecified level dressing change daily, Deep tissue injury on ankle)   PU Stage 2 Dressing:  (PRN)                   Personal Care Assistance Level of Assistance  Bathing, Dressing Bathing Assistance: Limited assistance   Dressing Assistance: Limited assistance      Functional Limitations Info             SPECIAL CARE FACTORS FREQUENCY  PT (By licensed PT)     PT Frequency: 5/wk              Contractures      Additional Factors Info  Code Status, Allergies, Isolation Precautions Code Status Info: FULL Allergies Info: Amlodipine, Ciprofloxacin, Mirtazapine, Statins, Aspirin, Cephalexin, Hydralazine, Iron, Ambien, Lorazepam, Sulfonamide Derivatives, Zolpidem, Penicillins, Shellfish Allergy, Tape           Current Medications (07/02/2015):  This is the current hospital active medication list Current Facility-Administered Medications  Medication Dose Route Frequency Provider Last Rate Last Dose  . acetaminophen (TYLENOL) tablet 650 mg  650 mg Oral Q6H PRN Theodis Blaze, MD   650 mg at 07/02/15 0125  . diphenhydrAMINE (BENADRYL) capsule 50 mg  50 mg Oral Once Jeryl Columbia, NP   50 mg at 06/19/15 2330  . enoxaparin (LOVENOX) injection 60 mg  1 mg/kg Subcutaneous Q24H Adrian Saran, RPH   60 mg at 07/01/15 1847  . ertapenem (INVANZ) 0.5 g in sodium chloride 0.9 % 50 mL IVPB  500 mg Intravenous Q24H Theodis Blaze, MD   0.5 g at 07/01/15 1255  . feeding supplement (PRO-STAT SUGAR FREE 64) liquid 30 mL  30 mL Oral BID Clayton Bibles, RD   30 mL at 07/01/15 2240  . furosemide (LASIX) injection 20 mg  20 mg Intravenous Daily Theodis Blaze, MD   20 mg at 07/01/15 1033  . gabapentin (NEURONTIN) capsule 100 mg  100 mg Oral QHS Florencia Reasons, MD   100 mg at 07/01/15 2241  . guaiFENesin-dextromethorphan (ROBITUSSIN DM) 100-10 MG/5ML syrup 5 mL  5 mL Oral Q4H PRN Theodis Blaze, MD      . HYDROcodone-acetaminophen (NORCO/VICODIN) 5-325 MG per tablet 1 tablet  1 tablet Oral Q6H PRN Florencia Reasons, MD      . lidocaine-prilocaine (EMLA) cream 1 application  1 application Topical PRN Florencia Reasons, MD      . loperamide (IMODIUM) capsule 2 mg  2 mg Oral Q6H PRN Florencia Reasons, MD   2 mg at 06/23/15 1503  . loperamide (IMODIUM) capsule 2 mg  2 mg Oral BID Carlyle Basques, MD   2  mg at 07/01/15 2241  . metoprolol tartrate (LOPRESSOR) tablet 12.5 mg  12.5 mg Oral BID Florencia Reasons, MD   12.5 mg at 07/01/15 2241  . pantoprazole (PROTONIX) EC tablet 40 mg  40 mg Oral Daily Florencia Reasons, MD   40 mg at 07/01/15 1034  . sodium bicarbonate tablet 650 mg  650 mg Oral BID Florencia Reasons, MD   650 mg at 07/01/15 2241  . sodium chloride 0.9 % injection 10-40 mL  10-40 mL Intracatheter Q12H Florencia Reasons, MD   10 mL at 06/29/15 2200  . sodium chloride 0.9 % injection 10-40 mL  10-40 mL Intracatheter PRN Florencia Reasons, MD   10 mL at 07/01/15 0336  . sodium chloride 0.9 % injection 3 mL  3 mL Intravenous Q12H Florencia Reasons, MD   3 mL at 06/29/15 2200     Discharge Medications: Please see discharge summary for a list of discharge medications.  Relevant Imaging Results:  Relevant Lab Results:   Additional Information SS#: [id number]  Cranford Mon, [place]

## 2015-07-02 NOTE — Discharge Instructions (Signed)
Ertapenem injection  What is this medicine?  ERTAPENEM (er ta PEN em) is a carbapenem antibiotic. It is used to treat certain kinds of bacterial infections. It will not work for colds, flu, or other viral infections.  This medicine may be used for other purposes; ask your health care provider or pharmacist if you have questions.  What should I tell my health care provider before I take this medicine?  They need to know if you have any of these conditions:  -brain tumor, lesion  -kidney disease  -seizure disorder  -an unusual or allergic reaction to ertapenem, other antibiotics, amide local anesthetics like lidocaine, or other medicines, foods, dyes, or preservatives  -pregnant or trying to get pregnant  -breast-feeding  How should I use this medicine?  This medicine is infused into a vein or injected deep into a muscle. It is usually given by a health care professional in a hospital or clinic setting.  If you get this medicine at home, you will be taught how to prepare and give this medicine. Use exactly as directed. Take your medicine at regular intervals. Do not take your medicine more often than directed.  It is important that you put your used needles and syringes in a special sharps container. Do not put them in a trash can. If you do not have a sharps container, call your pharmacist or healthcare provider to get one.  Talk to your pediatrician regarding the use of this medicine in children. While this drug may be prescribed for children as young as 3 months old for selected conditions, precautions do apply.  Overdosage: If you think you have taken too much of this medicine contact a poison control center or emergency room at once.  NOTE: This medicine is only for you. Do not share this medicine with others.  What if I miss a dose?  If you miss a dose, take it as soon as you can. If it is almost time for your next dose, take only that dose. Do not take double or extra doses.  What may interact with this  medicine?  -birth control pills  -probenecid  This list may not describe all possible interactions. Give your health care provider a list of all the medicines, herbs, non-prescription drugs, or dietary supplements you use. Also tell them if you smoke, drink alcohol, or use illegal drugs. Some items may interact with your medicine.  What should I watch for while using this medicine?  Tell your doctor or health care professional if your symptoms do not improve or if you get new symptoms. Your doctor will monitor your condition and blood work as needed.  Do not treat diarrhea with over the counter products. Contact your doctor if you have diarrhea that lasts more than 2 days or if it is severe and watery.  What side effects may I notice from receiving this medicine?  Side effects that you should report to your doctor or health care professional as soon as possible:  -allergic reactions like skin rash, itching or hives, swelling of the face, lips, or tongue  -anxiety, confusion, dizzy  -chest pain  -difficulty breathing, wheezing  -edema, swelling  -fever  -irregular heart rate, blood pressure  -pain or difficulty passing urine  -seizures  -unusually weak or tired  -white or red patches in mouth  Side effects that usually do not require medical attention (report to your doctor or health care professional if they continue or are bothersome):  -constipation or diarrhea  -  difficulty sleeping  -headache  -nausea, vomiting  -pain, swelling or irritation where injected  -stomach upset  -vaginal itch, irritation  This list may not describe all possible side effects. Call your doctor for medical advice about side effects. You may report side effects to FDA at 1-800-FDA-1088.  Where should I keep my medicine?  Keep out of the reach of children.  You will be instructed on how to store this medicine. Throw away any unused medicine after the expiration date on the label.  NOTE: This sheet is a summary. It may not cover all possible  information. If you have questions about this medicine, talk to your doctor, pharmacist, or health care provider.      2016, Elsevier/Gold Standard. (2012-12-25 07:36:44)

## 2015-07-02 NOTE — Progress Notes (Signed)
PT Cancellation Note  Patient Details Name: [name] MRN: [id number] DOB: 22-Dec-1933   Cancelled Treatment:    Reason Eval/Treat Not Completed: PT screened, no needs identified, will sign off; lengthy discussion with pt regarding her mobility status prior to adm, she reports to PT that she required care/total assist and remains in bed most of the time; she does feed herself after set up; pt reports that the only time she would be OOB is for MD visits and her grandsons "lift her OOB to w/c" and then lift her into car; It does no appear that pt has the desire or the potential for rehab, given the wounds pt she presently has  would not recommend that pt  sit up in chair or use a lift to be OOB at this time; PT will sign off--and defer PT to SNF  If it is indicated at that time.   [facility] 07/02/2015, 12:32 PM

## 2015-07-02 NOTE — Clinical Social Work Note (Signed)
Clinical Social Work Assessment  Patient Details  Name: [name] MRN: [id number] Date of Birth: 1933-07-09  Date of referral:  07/02/15               Reason for consult:  Facility Placement                Permission sought to share information with:  Family Supports, Chartered certified accountant granted to share information::  Yes, Verbal Permission Granted  Name::     [place]::  [facility] SN[facility]  Relationship::  dtr  Contact Information:     Housing/Transportation Living arrangements for the past 2 months:  [place] of Information:  Patient Patient Interpreter Needed:  None Criminal Activity/Legal Involvement Pertinent to Current Situation/Hospitalization:  No - Comment as needed Significant Relationships:  Adult Children Lives with:    Do you feel safe going back to the place where you live?  No Need for family participation in patient care:  Yes (Comment)  Care giving concerns:  Pt lives at home with help from dtr, other family, and some personal care attendent time but does not think this is enough given current medical needs (ie IV medications)   Facilities manager / plan:  CSW spoke with pt and pt dtr concerning MD recommendation for SNF for IV medications and wound care  Employment status:  Retired Nurse, adult PT Recommendations:  [place] / Referral to community resources:  [place]  Patient/Family's Response to care:  Pt and pt dtr are agreeable to pt going to SNF for short term rehab so that she gets proper level of medical care for her IV and wounds  Patient/Family's Understanding of and Emotional Response to Diagnosis, Current Treatment, and Prognosis:  No questions or concerns at this time  Emotional Assessment Appearance:  Appears stated age Attitude/Demeanor/Rapport:    Affect (typically observed):  Accepting,  Appropriate Orientation:  Oriented to Self, Oriented to Place, Oriented to  Time, Oriented to Situation Alcohol / Substance use:  Not Applicable Psych involvement (Current and /or in the community):  No (Comment)  Discharge Needs  Concerns to be addressed:  Care Coordination Readmission within the last 30 days:  No Current discharge risk:  Physical Impairment Barriers to Discharge:  Continued Medical Work up   [employer], LCSW 07/02/2015, 3:16 PM

## 2015-07-03 DIAGNOSIS — Z87891 Personal history of nicotine dependence: Secondary | ICD-10-CM | POA: Diagnosis not present

## 2015-07-03 DIAGNOSIS — C3490 Malignant neoplasm of unspecified part of unspecified bronchus or lung: Secondary | ICD-10-CM | POA: Diagnosis not present

## 2015-07-03 DIAGNOSIS — N390 Urinary tract infection, site not specified: Secondary | ICD-10-CM | POA: Diagnosis not present

## 2015-07-03 DIAGNOSIS — L89621 Pressure ulcer of left heel, stage 1: Secondary | ICD-10-CM | POA: Diagnosis present

## 2015-07-03 DIAGNOSIS — E875 Hyperkalemia: Secondary | ICD-10-CM | POA: Diagnosis not present

## 2015-07-03 DIAGNOSIS — T83518A Infection and inflammatory reaction due to other urinary catheter, initial encounter: Secondary | ICD-10-CM | POA: Diagnosis not present

## 2015-07-03 DIAGNOSIS — R278 Other lack of coordination: Secondary | ICD-10-CM | POA: Diagnosis not present

## 2015-07-03 DIAGNOSIS — R1319 Other dysphagia: Secondary | ICD-10-CM | POA: Diagnosis not present

## 2015-07-03 DIAGNOSIS — Z882 Allergy status to sulfonamides status: Secondary | ICD-10-CM | POA: Diagnosis not present

## 2015-07-03 DIAGNOSIS — Z789 Other specified health status: Secondary | ICD-10-CM | POA: Diagnosis not present

## 2015-07-03 DIAGNOSIS — I9589 Other hypotension: Secondary | ICD-10-CM | POA: Diagnosis not present

## 2015-07-03 DIAGNOSIS — Z8249 Family history of ischemic heart disease and other diseases of the circulatory system: Secondary | ICD-10-CM | POA: Diagnosis not present

## 2015-07-03 DIAGNOSIS — Z800 Family history of malignant neoplasm of digestive organs: Secondary | ICD-10-CM | POA: Diagnosis not present

## 2015-07-03 DIAGNOSIS — C7951 Secondary malignant neoplasm of bone: Secondary | ICD-10-CM | POA: Diagnosis not present

## 2015-07-03 DIAGNOSIS — Z86718 Personal history of other venous thrombosis and embolism: Secondary | ICD-10-CM | POA: Diagnosis not present

## 2015-07-03 DIAGNOSIS — T8851XA Hypothermia following anesthesia, initial encounter: Secondary | ICD-10-CM | POA: Diagnosis not present

## 2015-07-03 DIAGNOSIS — J90 Pleural effusion, not elsewhere classified: Secondary | ICD-10-CM | POA: Diagnosis not present

## 2015-07-03 DIAGNOSIS — I2510 Atherosclerotic heart disease of native coronary artery without angina pectoris: Secondary | ICD-10-CM | POA: Diagnosis not present

## 2015-07-03 DIAGNOSIS — I959 Hypotension, unspecified: Secondary | ICD-10-CM | POA: Diagnosis not present

## 2015-07-03 DIAGNOSIS — A4189 Other specified sepsis: Secondary | ICD-10-CM | POA: Diagnosis not present

## 2015-07-03 DIAGNOSIS — L89813 Pressure ulcer of head, stage 3: Secondary | ICD-10-CM | POA: Diagnosis not present

## 2015-07-03 DIAGNOSIS — Z886 Allergy status to analgesic agent status: Secondary | ICD-10-CM | POA: Diagnosis not present

## 2015-07-03 DIAGNOSIS — Y846 Urinary catheterization as the cause of abnormal reaction of the patient, or of later complication, without mention of misadventure at the time of the procedure: Secondary | ICD-10-CM | POA: Diagnosis present

## 2015-07-03 DIAGNOSIS — Z9109 Other allergy status, other than to drugs and biological substances: Secondary | ICD-10-CM | POA: Diagnosis not present

## 2015-07-03 DIAGNOSIS — R279 Unspecified lack of coordination: Secondary | ICD-10-CM | POA: Diagnosis not present

## 2015-07-03 DIAGNOSIS — E7800 Pure hypercholesterolemia, unspecified: Secondary | ICD-10-CM | POA: Diagnosis present

## 2015-07-03 DIAGNOSIS — Z79899 Other long term (current) drug therapy: Secondary | ICD-10-CM | POA: Diagnosis not present

## 2015-07-03 DIAGNOSIS — E440 Moderate protein-calorie malnutrition: Secondary | ICD-10-CM | POA: Diagnosis not present

## 2015-07-03 DIAGNOSIS — T68XXXA Hypothermia, initial encounter: Secondary | ICD-10-CM | POA: Diagnosis not present

## 2015-07-03 DIAGNOSIS — L89154 Pressure ulcer of sacral region, stage 4: Secondary | ICD-10-CM | POA: Diagnosis not present

## 2015-07-03 DIAGNOSIS — L89814 Pressure ulcer of head, stage 4: Secondary | ICD-10-CM | POA: Diagnosis not present

## 2015-07-03 DIAGNOSIS — I5032 Chronic diastolic (congestive) heart failure: Secondary | ICD-10-CM | POA: Diagnosis not present

## 2015-07-03 DIAGNOSIS — Z801 Family history of malignant neoplasm of trachea, bronchus and lung: Secondary | ICD-10-CM | POA: Diagnosis not present

## 2015-07-03 DIAGNOSIS — L89213 Pressure ulcer of right hip, stage 3: Secondary | ICD-10-CM | POA: Diagnosis not present

## 2015-07-03 DIAGNOSIS — E876 Hypokalemia: Secondary | ICD-10-CM | POA: Diagnosis not present

## 2015-07-03 DIAGNOSIS — Z7901 Long term (current) use of anticoagulants: Secondary | ICD-10-CM | POA: Diagnosis not present

## 2015-07-03 DIAGNOSIS — Z7952 Long term (current) use of systemic steroids: Secondary | ICD-10-CM | POA: Diagnosis not present

## 2015-07-03 DIAGNOSIS — Z881 Allergy status to other antibiotic agents status: Secondary | ICD-10-CM | POA: Diagnosis not present

## 2015-07-03 DIAGNOSIS — I82409 Acute embolism and thrombosis of unspecified deep veins of unspecified lower extremity: Secondary | ICD-10-CM | POA: Diagnosis not present

## 2015-07-03 DIAGNOSIS — L97829 Non-pressure chronic ulcer of other part of left lower leg with unspecified severity: Secondary | ICD-10-CM | POA: Diagnosis not present

## 2015-07-03 DIAGNOSIS — G061 Intraspinal abscess and granuloma: Secondary | ICD-10-CM | POA: Diagnosis not present

## 2015-07-03 DIAGNOSIS — R300 Dysuria: Secondary | ICD-10-CM | POA: Diagnosis present

## 2015-07-03 DIAGNOSIS — N179 Acute kidney failure, unspecified: Secondary | ICD-10-CM | POA: Diagnosis not present

## 2015-07-03 DIAGNOSIS — Z955 Presence of coronary angioplasty implant and graft: Secondary | ICD-10-CM | POA: Diagnosis not present

## 2015-07-03 DIAGNOSIS — R609 Edema, unspecified: Secondary | ICD-10-CM | POA: Diagnosis not present

## 2015-07-03 DIAGNOSIS — I4891 Unspecified atrial fibrillation: Secondary | ICD-10-CM | POA: Diagnosis not present

## 2015-07-03 DIAGNOSIS — L89210 Pressure ulcer of right hip, unstageable: Secondary | ICD-10-CM | POA: Diagnosis not present

## 2015-07-03 DIAGNOSIS — I255 Ischemic cardiomyopathy: Secondary | ICD-10-CM | POA: Diagnosis present

## 2015-07-03 DIAGNOSIS — Z880 Allergy status to penicillin: Secondary | ICD-10-CM | POA: Diagnosis not present

## 2015-07-03 DIAGNOSIS — Z1624 Resistance to multiple antibiotics: Secondary | ICD-10-CM | POA: Diagnosis not present

## 2015-07-03 DIAGNOSIS — M6281 Muscle weakness (generalized): Secondary | ICD-10-CM | POA: Diagnosis not present

## 2015-07-03 DIAGNOSIS — L89513 Pressure ulcer of right ankle, stage 3: Secondary | ICD-10-CM | POA: Diagnosis not present

## 2015-07-03 DIAGNOSIS — Z888 Allergy status to other drugs, medicaments and biological substances status: Secondary | ICD-10-CM | POA: Diagnosis not present

## 2015-07-03 DIAGNOSIS — N183 Chronic kidney disease, stage 3 (moderate): Secondary | ICD-10-CM | POA: Diagnosis not present

## 2015-07-03 DIAGNOSIS — B9689 Other specified bacterial agents as the cause of diseases classified elsewhere: Secondary | ICD-10-CM | POA: Diagnosis not present

## 2015-07-03 DIAGNOSIS — I129 Hypertensive chronic kidney disease with stage 1 through stage 4 chronic kidney disease, or unspecified chronic kidney disease: Secondary | ICD-10-CM | POA: Diagnosis not present

## 2015-07-03 DIAGNOSIS — C3480 Malignant neoplasm of overlapping sites of unspecified bronchus and lung: Secondary | ICD-10-CM | POA: Diagnosis not present

## 2015-07-03 DIAGNOSIS — E860 Dehydration: Secondary | ICD-10-CM | POA: Diagnosis not present

## 2015-07-03 DIAGNOSIS — D638 Anemia in other chronic diseases classified elsewhere: Secondary | ICD-10-CM | POA: Diagnosis not present

## 2015-07-03 DIAGNOSIS — Z91013 Allergy to seafood: Secondary | ICD-10-CM | POA: Diagnosis not present

## 2015-07-03 DIAGNOSIS — D649 Anemia, unspecified: Secondary | ICD-10-CM | POA: Diagnosis not present

## 2015-07-03 DIAGNOSIS — K219 Gastro-esophageal reflux disease without esophagitis: Secondary | ICD-10-CM | POA: Diagnosis present

## 2015-07-03 DIAGNOSIS — R339 Retention of urine, unspecified: Secondary | ICD-10-CM | POA: Diagnosis not present

## 2015-07-03 DIAGNOSIS — D630 Anemia in neoplastic disease: Secondary | ICD-10-CM | POA: Diagnosis not present

## 2015-07-03 DIAGNOSIS — I82411 Acute embolism and thrombosis of right femoral vein: Secondary | ICD-10-CM | POA: Diagnosis not present

## 2015-07-03 DIAGNOSIS — N184 Chronic kidney disease, stage 4 (severe): Secondary | ICD-10-CM | POA: Diagnosis not present

## 2015-07-03 MED ORDER — PREDNISONE 5 MG PO TABS: 50.0000 mg | ORAL_TABLET | Freq: Every day | ORAL | Status: DC

## 2015-07-03 MED ORDER — PREDNISONE 50 MG PO TABS
50.0000 mg | ORAL_TABLET | Freq: Once | ORAL | Status: AC
Start: 2015-07-03 — End: 2015-07-03
  Administered 2015-07-03: 50 mg via ORAL
  Filled 2015-07-03: qty 1

## 2015-07-03 MED ORDER — DIPHENHYDRAMINE HCL 25 MG PO CAPS: 25.0000 mg | ORAL_CAPSULE | Freq: Four times a day (QID) | ORAL | Status: DC | PRN

## 2015-07-03 MED ORDER — HEPARIN SOD (PORK) LOCK FLUSH 100 UNIT/ML IV SOLN
500.0000 [IU] | INTRAVENOUS | Status: AC | PRN
  Administered 2015-07-03: 500 [IU]

## 2015-07-03 MED ORDER — FAMOTIDINE 20 MG PO TABS: 20.0000 mg | ORAL_TABLET | Freq: Every day | ORAL | Status: DC

## 2015-07-03 NOTE — Care Management Note (Signed)
Case Management Note  Patient Details  Name: [name] MRN: [id number] Date of Birth: 06-24-1933  Subjective/Objective:                    Action/Plan:d/c SNF.   Expected Discharge Date:   (unknown)               Expected Discharge Plan:  [place]  In-House Referral:  NA  Discharge planning Services  CM Consult  Post Acute Care Choice:  Home Health, Durable Medical Equipment (Active w/[facility]-HHRN/HHPT;[facility] dme-hospital bed.) Choice offered to:     DME Arranged:    DME Agency:     HH Arranged:    HH Agency:     Status of Service:  Completed, signed off  Medicare Important Message Given:  Yes Date Medicare IM Given:    Medicare IM give by:    Date Additional Medicare IM Given:    Additional Medicare Important Message give by:     If discussed at [place] of Stay Meetings, dates discussed:    Additional Comments:  Dessa Phi, RN 07/03/2015, 3:35 PM

## 2015-07-03 NOTE — Progress Notes (Signed)
Gave report to Bethena Roys, Therapist, sports at [facility] in [place], [place]. Left number if she had additional questions.

## 2015-07-03 NOTE — Clinical Social Work Placement (Addendum)
   CLINICAL SOCIAL WORK PLACEMENT  NOTE  Date:  07/03/2015  Patient Details  Name: [name] MRN: [id number] Date of Birth: 10-14-1933  Clinical Social Work is seeking post-discharge placement for this patient at the [place] level of care (*CSW will initial, date and re-position this form in  chart as items are completed):  Yes   Patient/family provided with [place] Work Department's list of facilities offering this level of care within the geographic area requested by the patient (or if unable, by the patient's family).  Yes   Patient/family informed of their freedom to choose among providers that offer the needed level of care, that participate in Medicare, Medicaid or managed care program needed by the patient, have an available bed and are willing to accept the patient.  Yes   Patient/family informed of [facility]'s ownership interest in [facility] and [facility], as well as of the fact that they are under no obligation to receive care at these facilities.  PASRR submitted to EDS on       PASRR number received on       Existing PASRR number confirmed on 07/02/15     FL2 transmitted to all facilities in geographic area requested by pt/family on 07/02/15     FL2 transmitted to all facilities within larger geographic area on       Patient informed that his/her managed care company has contracts with or will negotiate with certain facilities, including the following:        Yes   Patient/family informed of bed offers received.  Patient chooses bed at  ([place])     Physician recommends and patient chooses bed at      Patient to be transferred to  ([place]) on 07/03/15.  Patient to be transferred to facility by  Corey Harold)     Patient family notified on 07/03/15 of transfer.  Name of family member notified:   (Daughter- Sonja )     PHYSICIAN       Additional Comment:     _______________________________________________ Linna Darner, LCSW 07/03/2015, 2:19 PM

## 2015-07-03 NOTE — Progress Notes (Signed)
Patient ID: [name], female   DOB: 07-Jun-1933, 82 y.o.   MRN: [id number] Pt medically stable for discharge to SNF. No changes in medical management since 07/02/2015. Please refer to D/C summary completed 07/02/2015.  Leisa Lenz [facility] [phone]

## 2015-07-03 NOTE — Clinical Social Work Note (Addendum)
9:55am- CSW spoke to admission staff at [facility], SNF in [place] [pt's first choice]. [facility] is rescinding their bed offer due to high cost of pt's antibiotic. CSW completed referral to [facility] in [place], [place] [pt's second choice]. Admission staff will review clinical info and contact CSW directly if they would like to make a bed offer. Pt is ready for d/c today.   12:30pm- [facility] in [place] has a private room for the pt; awaiting insurance authorization currently- per admission staff.   1:50pm- Pt's insurance has been authorized and pt can go to [facility] in [place] today. CSW will facilitate d/c and arrange for transport.   Cindra Presume, LCSW [phone] Hospital psychiatric & 5E, 5W 25-75 Licensed Clinical Social Worker

## 2015-07-04 DIAGNOSIS — L89813 Pressure ulcer of head, stage 3: Secondary | ICD-10-CM | POA: Diagnosis not present

## 2015-07-04 DIAGNOSIS — D649 Anemia, unspecified: Secondary | ICD-10-CM | POA: Diagnosis not present

## 2015-07-04 DIAGNOSIS — L97829 Non-pressure chronic ulcer of other part of left lower leg with unspecified severity: Secondary | ICD-10-CM | POA: Diagnosis not present

## 2015-07-04 DIAGNOSIS — A4189 Other specified sepsis: Secondary | ICD-10-CM | POA: Diagnosis not present

## 2015-07-04 DIAGNOSIS — E876 Hypokalemia: Secondary | ICD-10-CM | POA: Diagnosis not present

## 2015-07-04 DIAGNOSIS — I9589 Other hypotension: Secondary | ICD-10-CM | POA: Diagnosis not present

## 2015-07-04 DIAGNOSIS — L89210 Pressure ulcer of right hip, unstageable: Secondary | ICD-10-CM | POA: Diagnosis not present

## 2015-07-05 DIAGNOSIS — A4189 Other specified sepsis: Secondary | ICD-10-CM | POA: Diagnosis not present

## 2015-07-05 DIAGNOSIS — I9589 Other hypotension: Secondary | ICD-10-CM | POA: Diagnosis not present

## 2015-07-05 DIAGNOSIS — D649 Anemia, unspecified: Secondary | ICD-10-CM | POA: Diagnosis not present

## 2015-07-05 DIAGNOSIS — E876 Hypokalemia: Secondary | ICD-10-CM | POA: Diagnosis not present

## 2015-07-05 DIAGNOSIS — L89813 Pressure ulcer of head, stage 3: Secondary | ICD-10-CM | POA: Diagnosis not present

## 2015-07-06 LAB — CULTURE, BLOOD (ROUTINE X 2)
CULTURE: NO GROWTH
CULTURE: NO GROWTH

## 2015-07-07 DIAGNOSIS — L89813 Pressure ulcer of head, stage 3: Secondary | ICD-10-CM | POA: Diagnosis not present

## 2015-07-07 DIAGNOSIS — D649 Anemia, unspecified: Secondary | ICD-10-CM | POA: Diagnosis not present

## 2015-07-07 DIAGNOSIS — A4189 Other specified sepsis: Secondary | ICD-10-CM | POA: Diagnosis not present

## 2015-07-07 DIAGNOSIS — E876 Hypokalemia: Secondary | ICD-10-CM | POA: Diagnosis not present

## 2015-07-07 DIAGNOSIS — I9589 Other hypotension: Secondary | ICD-10-CM | POA: Diagnosis not present

## 2015-07-11 DIAGNOSIS — L89210 Pressure ulcer of right hip, unstageable: Secondary | ICD-10-CM | POA: Diagnosis not present

## 2015-07-11 DIAGNOSIS — L97829 Non-pressure chronic ulcer of other part of left lower leg with unspecified severity: Secondary | ICD-10-CM | POA: Diagnosis not present

## 2015-07-11 DIAGNOSIS — M6281 Muscle weakness (generalized): Secondary | ICD-10-CM | POA: Diagnosis not present

## 2015-07-13 ENCOUNTER — Telehealth: Payer: Self-pay | Admitting: *Deleted

## 2015-07-13 NOTE — Telephone Encounter (Signed)
Message from Triage regarding next appt for pt. Pt was discharged from Hospital to [facility] in [place].  Call placed to facility spoke with Shirlean Mylar, RN who advised pt is currently on antibiotics, they were unable to use her PAC due to a questionable infection. Upon completion of antibiotics she thinks they will redraw blood cultures, however she does not have an order at this time. No estimated length of stay at this time. RN advised she thinks pt will return home after antibiotics have completed. Will review with MD

## 2015-07-14 DIAGNOSIS — A4189 Other specified sepsis: Secondary | ICD-10-CM | POA: Diagnosis not present

## 2015-07-14 DIAGNOSIS — D649 Anemia, unspecified: Secondary | ICD-10-CM | POA: Diagnosis not present

## 2015-07-14 DIAGNOSIS — E876 Hypokalemia: Secondary | ICD-10-CM | POA: Diagnosis not present

## 2015-07-14 DIAGNOSIS — L89813 Pressure ulcer of head, stage 3: Secondary | ICD-10-CM | POA: Diagnosis not present

## 2015-07-14 DIAGNOSIS — I9589 Other hypotension: Secondary | ICD-10-CM | POA: Diagnosis not present

## 2015-07-14 NOTE — Telephone Encounter (Signed)
Call to pt's Daughter [name], discussed f/u should be 2-3 weeks from the time the pt returns home. Katharine Look advised pt's port is not red, no drainage, no swelling, no pain. I do not think it's infected and mama has not complained about it hurting or hurting her. [name] advised pt is feeling good, and getting stronger eating and drinking fluids. [name] will speak with pt regarding above information and she will call back to schedule a f/u with MD after pt returns home. No further concerns.

## 2015-07-17 ENCOUNTER — Encounter (HOSPITAL_COMMUNITY): Payer: Self-pay | Admitting: Emergency Medicine

## 2015-07-17 ENCOUNTER — Inpatient Hospital Stay (HOSPITAL_COMMUNITY)
Admission: EM | Admit: 2015-07-17 | Discharge: 2015-07-22 | DRG: 698 | Disposition: A | Discharge status: Home Health | Payer: Medicare Other | Attending: Internal Medicine | Admitting: Internal Medicine

## 2015-07-17 DIAGNOSIS — Z886 Allergy status to analgesic agent status: Secondary | ICD-10-CM | POA: Diagnosis not present

## 2015-07-17 DIAGNOSIS — T8851XA Hypothermia following anesthesia, initial encounter: Secondary | ICD-10-CM | POA: Diagnosis not present

## 2015-07-17 DIAGNOSIS — I5032 Chronic diastolic (congestive) heart failure: Secondary | ICD-10-CM | POA: Diagnosis present

## 2015-07-17 DIAGNOSIS — Z87891 Personal history of nicotine dependence: Secondary | ICD-10-CM

## 2015-07-17 DIAGNOSIS — Y846 Urinary catheterization as the cause of abnormal reaction of the patient, or of later complication, without mention of misadventure at the time of the procedure: Secondary | ICD-10-CM | POA: Diagnosis present

## 2015-07-17 DIAGNOSIS — E876 Hypokalemia: Secondary | ICD-10-CM | POA: Diagnosis not present

## 2015-07-17 DIAGNOSIS — L89513 Pressure ulcer of right ankle, stage 3: Secondary | ICD-10-CM | POA: Diagnosis not present

## 2015-07-17 DIAGNOSIS — N179 Acute kidney failure, unspecified: Secondary | ICD-10-CM | POA: Diagnosis not present

## 2015-07-17 DIAGNOSIS — B9689 Other specified bacterial agents as the cause of diseases classified elsewhere: Secondary | ICD-10-CM | POA: Diagnosis present

## 2015-07-17 DIAGNOSIS — Z91013 Allergy to seafood: Secondary | ICD-10-CM | POA: Diagnosis not present

## 2015-07-17 DIAGNOSIS — I4891 Unspecified atrial fibrillation: Secondary | ICD-10-CM | POA: Diagnosis not present

## 2015-07-17 DIAGNOSIS — Z7901 Long term (current) use of anticoagulants: Secondary | ICD-10-CM | POA: Diagnosis not present

## 2015-07-17 DIAGNOSIS — N183 Chronic kidney disease, stage 3 unspecified: Secondary | ICD-10-CM | POA: Diagnosis present

## 2015-07-17 DIAGNOSIS — Z882 Allergy status to sulfonamides status: Secondary | ICD-10-CM | POA: Diagnosis not present

## 2015-07-17 DIAGNOSIS — Z8249 Family history of ischemic heart disease and other diseases of the circulatory system: Secondary | ICD-10-CM | POA: Diagnosis not present

## 2015-07-17 DIAGNOSIS — Z79899 Other long term (current) drug therapy: Secondary | ICD-10-CM

## 2015-07-17 DIAGNOSIS — E875 Hyperkalemia: Secondary | ICD-10-CM

## 2015-07-17 DIAGNOSIS — E440 Moderate protein-calorie malnutrition: Secondary | ICD-10-CM | POA: Diagnosis not present

## 2015-07-17 DIAGNOSIS — E7800 Pure hypercholesterolemia, unspecified: Secondary | ICD-10-CM | POA: Diagnosis present

## 2015-07-17 DIAGNOSIS — I82401 Acute embolism and thrombosis of unspecified deep veins of right lower extremity: Secondary | ICD-10-CM | POA: Diagnosis present

## 2015-07-17 DIAGNOSIS — R1319 Other dysphagia: Secondary | ICD-10-CM | POA: Diagnosis not present

## 2015-07-17 DIAGNOSIS — T83518A Infection and inflammatory reaction due to other urinary catheter, initial encounter: Principal | ICD-10-CM | POA: Diagnosis present

## 2015-07-17 DIAGNOSIS — D649 Anemia, unspecified: Secondary | ICD-10-CM | POA: Diagnosis not present

## 2015-07-17 DIAGNOSIS — Z9109 Other allergy status, other than to drugs and biological substances: Secondary | ICD-10-CM | POA: Diagnosis not present

## 2015-07-17 DIAGNOSIS — L89213 Pressure ulcer of right hip, stage 3: Secondary | ICD-10-CM | POA: Diagnosis not present

## 2015-07-17 DIAGNOSIS — C3490 Malignant neoplasm of unspecified part of unspecified bronchus or lung: Secondary | ICD-10-CM | POA: Diagnosis not present

## 2015-07-17 DIAGNOSIS — C7951 Secondary malignant neoplasm of bone: Secondary | ICD-10-CM | POA: Diagnosis present

## 2015-07-17 DIAGNOSIS — Z955 Presence of coronary angioplasty implant and graft: Secondary | ICD-10-CM

## 2015-07-17 DIAGNOSIS — C3480 Malignant neoplasm of overlapping sites of unspecified bronchus and lung: Secondary | ICD-10-CM | POA: Diagnosis not present

## 2015-07-17 DIAGNOSIS — T83511A Infection and inflammatory reaction due to indwelling urethral catheter, initial encounter: Secondary | ICD-10-CM | POA: Diagnosis not present

## 2015-07-17 DIAGNOSIS — Z1624 Resistance to multiple antibiotics: Secondary | ICD-10-CM | POA: Diagnosis not present

## 2015-07-17 DIAGNOSIS — I129 Hypertensive chronic kidney disease with stage 1 through stage 4 chronic kidney disease, or unspecified chronic kidney disease: Secondary | ICD-10-CM | POA: Diagnosis present

## 2015-07-17 DIAGNOSIS — I82409 Acute embolism and thrombosis of unspecified deep veins of unspecified lower extremity: Secondary | ICD-10-CM | POA: Diagnosis not present

## 2015-07-17 DIAGNOSIS — Z800 Family history of malignant neoplasm of digestive organs: Secondary | ICD-10-CM

## 2015-07-17 DIAGNOSIS — Z888 Allergy status to other drugs, medicaments and biological substances status: Secondary | ICD-10-CM | POA: Diagnosis not present

## 2015-07-17 DIAGNOSIS — J90 Pleural effusion, not elsewhere classified: Secondary | ICD-10-CM | POA: Diagnosis not present

## 2015-07-17 DIAGNOSIS — N390 Urinary tract infection, site not specified: Secondary | ICD-10-CM | POA: Diagnosis not present

## 2015-07-17 DIAGNOSIS — Z881 Allergy status to other antibiotic agents status: Secondary | ICD-10-CM

## 2015-07-17 DIAGNOSIS — I82411 Acute embolism and thrombosis of right femoral vein: Secondary | ICD-10-CM | POA: Diagnosis present

## 2015-07-17 DIAGNOSIS — L89154 Pressure ulcer of sacral region, stage 4: Secondary | ICD-10-CM | POA: Diagnosis not present

## 2015-07-17 DIAGNOSIS — I2510 Atherosclerotic heart disease of native coronary artery without angina pectoris: Secondary | ICD-10-CM | POA: Diagnosis not present

## 2015-07-17 DIAGNOSIS — Z801 Family history of malignant neoplasm of trachea, bronchus and lung: Secondary | ICD-10-CM | POA: Diagnosis not present

## 2015-07-17 DIAGNOSIS — R279 Unspecified lack of coordination: Secondary | ICD-10-CM | POA: Diagnosis not present

## 2015-07-17 DIAGNOSIS — N184 Chronic kidney disease, stage 4 (severe): Secondary | ICD-10-CM | POA: Diagnosis present

## 2015-07-17 DIAGNOSIS — L89813 Pressure ulcer of head, stage 3: Secondary | ICD-10-CM | POA: Diagnosis not present

## 2015-07-17 DIAGNOSIS — R300 Dysuria: Secondary | ICD-10-CM | POA: Diagnosis not present

## 2015-07-17 DIAGNOSIS — I255 Ischemic cardiomyopathy: Secondary | ICD-10-CM | POA: Diagnosis present

## 2015-07-17 DIAGNOSIS — E860 Dehydration: Secondary | ICD-10-CM | POA: Diagnosis present

## 2015-07-17 DIAGNOSIS — D638 Anemia in other chronic diseases classified elsewhere: Secondary | ICD-10-CM | POA: Diagnosis present

## 2015-07-17 DIAGNOSIS — I5033 Acute on chronic diastolic (congestive) heart failure: Secondary | ICD-10-CM | POA: Diagnosis present

## 2015-07-17 DIAGNOSIS — G061 Intraspinal abscess and granuloma: Secondary | ICD-10-CM | POA: Diagnosis not present

## 2015-07-17 DIAGNOSIS — Z7952 Long term (current) use of systemic steroids: Secondary | ICD-10-CM | POA: Diagnosis not present

## 2015-07-17 DIAGNOSIS — L89814 Pressure ulcer of head, stage 4: Secondary | ICD-10-CM | POA: Diagnosis not present

## 2015-07-17 DIAGNOSIS — A4189 Other specified sepsis: Secondary | ICD-10-CM | POA: Diagnosis not present

## 2015-07-17 DIAGNOSIS — R339 Retention of urine, unspecified: Secondary | ICD-10-CM | POA: Diagnosis not present

## 2015-07-17 DIAGNOSIS — M4620 Osteomyelitis of vertebra, site unspecified: Secondary | ICD-10-CM | POA: Diagnosis present

## 2015-07-17 DIAGNOSIS — Z86718 Personal history of other venous thrombosis and embolism: Secondary | ICD-10-CM | POA: Diagnosis not present

## 2015-07-17 DIAGNOSIS — L8990 Pressure ulcer of unspecified site, unspecified stage: Secondary | ICD-10-CM | POA: Diagnosis present

## 2015-07-17 DIAGNOSIS — Z880 Allergy status to penicillin: Secondary | ICD-10-CM

## 2015-07-17 DIAGNOSIS — L97519 Non-pressure chronic ulcer of other part of right foot with unspecified severity: Secondary | ICD-10-CM | POA: Diagnosis present

## 2015-07-17 DIAGNOSIS — K219 Gastro-esophageal reflux disease without esophagitis: Secondary | ICD-10-CM | POA: Diagnosis present

## 2015-07-17 DIAGNOSIS — Z789 Other specified health status: Secondary | ICD-10-CM | POA: Diagnosis not present

## 2015-07-17 DIAGNOSIS — I480 Paroxysmal atrial fibrillation: Secondary | ICD-10-CM | POA: Diagnosis not present

## 2015-07-17 DIAGNOSIS — L89621 Pressure ulcer of left heel, stage 1: Secondary | ICD-10-CM | POA: Diagnosis present

## 2015-07-17 DIAGNOSIS — R609 Edema, unspecified: Secondary | ICD-10-CM | POA: Diagnosis not present

## 2015-07-17 DIAGNOSIS — R278 Other lack of coordination: Secondary | ICD-10-CM | POA: Diagnosis not present

## 2015-07-17 DIAGNOSIS — I959 Hypotension, unspecified: Secondary | ICD-10-CM | POA: Diagnosis not present

## 2015-07-17 DIAGNOSIS — D630 Anemia in neoplastic disease: Secondary | ICD-10-CM | POA: Diagnosis not present

## 2015-07-17 LAB — BASIC METABOLIC PANEL
Anion gap: 7 (ref 5–15)
BUN: 74 mg/dL — AB (ref 6–20)
CHLORIDE: 110 mmol/L (ref 101–111)
CO2: 22 mmol/L (ref 22–32)
CREATININE: 2.02 mg/dL — AB (ref 0.44–1.00)
Calcium: 7.7 mg/dL — ABNORMAL LOW (ref 8.9–10.3)
GFR calc Af Amer: 26 mL/min — ABNORMAL LOW (ref >60)
GFR calc non Af Amer: 22 mL/min — ABNORMAL LOW (ref >60)
Glucose, Bld: 137 mg/dL — ABNORMAL HIGH (ref 65–99)
POTASSIUM: 6.2 mmol/L — AB (ref 3.5–5.1)
Sodium: 139 mmol/L (ref 135–145)

## 2015-07-17 LAB — CBC WITH DIFFERENTIAL/PLATELET
Basophils Absolute: 0 10*3/uL (ref 0.0–0.1)
Basophils Relative: 0 %
Eosinophils Absolute: 0 10*3/uL (ref 0.0–0.7)
Eosinophils Relative: 0 %
HEMATOCRIT: 26.2 % — AB (ref 36.0–46.0)
HEMOGLOBIN: 8.5 g/dL — AB (ref 12.0–15.0)
Lymphocytes Relative: 5 %
Lymphs Abs: 0.4 10*3/uL — ABNORMAL LOW (ref 0.7–4.0)
MCH: 28.8 pg (ref 26.0–34.0)
MCHC: 32.4 g/dL (ref 30.0–36.0)
MCV: 88.8 fL (ref 78.0–100.0)
MONOS PCT: 3 %
Monocytes Absolute: 0.3 10*3/uL (ref 0.1–1.0)
Neutro Abs: 7.5 10*3/uL (ref 1.7–7.7)
Neutrophils Relative %: 92 %
Platelets: 208 10*3/uL (ref 150–400)
RBC: 2.95 MIL/uL — ABNORMAL LOW (ref 3.87–5.11)
RDW: 17.2 % — ABNORMAL HIGH (ref 11.5–15.5)
WBC: 8.1 10*3/uL (ref 4.0–10.5)

## 2015-07-17 LAB — URINALYSIS, ROUTINE W REFLEX MICROSCOPIC
BILIRUBIN URINE: NEGATIVE
GLUCOSE, UA: NEGATIVE mg/dL
KETONES UR: NEGATIVE mg/dL
NITRITE: NEGATIVE
PH: 6.5 (ref 5.0–8.0)
Protein, ur: NEGATIVE mg/dL
Specific Gravity, Urine: 1.011 (ref 1.005–1.030)

## 2015-07-17 LAB — URINE MICROSCOPIC-ADD ON

## 2015-07-17 MED ORDER — DIPHENHYDRAMINE HCL 25 MG PO CAPS: 25.0000 mg | ORAL_CAPSULE | Freq: Four times a day (QID) | ORAL | Status: DC | PRN

## 2015-07-17 MED ORDER — PANTOPRAZOLE SODIUM 40 MG PO TBEC
40.0000 mg | DELAYED_RELEASE_TABLET | Freq: Every day | ORAL | Status: DC
  Administered 2015-07-18 – 2015-07-22 (×6): 40 mg via ORAL
  Filled 2015-07-17 (×6): qty 1

## 2015-07-17 MED ORDER — SENNOSIDES-DOCUSATE SODIUM 8.6-50 MG PO TABS: 1.0000 | ORAL_TABLET | Freq: Every evening | ORAL | Status: DC | PRN

## 2015-07-17 MED ORDER — SODIUM CHLORIDE 0.9 % IV SOLN
1.0000 g | Freq: Once | INTRAVENOUS | Status: AC
  Administered 2015-07-18: 1 g via INTRAVENOUS
  Filled 2015-07-17: qty 10

## 2015-07-17 MED ORDER — METOPROLOL TARTRATE 12.5 MG HALF TABLET
12.5000 mg | ORAL_TABLET | Freq: Two times a day (BID) | ORAL | Status: DC
  Administered 2015-07-18 – 2015-07-22 (×10): 12.5 mg via ORAL
  Filled 2015-07-17 (×11): qty 1

## 2015-07-17 MED ORDER — SODIUM CHLORIDE 0.9 % IV SOLN
INTRAVENOUS | Status: DC
  Administered 2015-07-17: 18:00:00 via INTRAVENOUS

## 2015-07-17 MED ORDER — ENSURE ENLIVE PO LIQD
237.0000 mL | Freq: Two times a day (BID) | ORAL | Status: DC
  Administered 2015-07-18 – 2015-07-22 (×4): 237 mL via ORAL

## 2015-07-17 MED ORDER — SODIUM CHLORIDE 0.9% FLUSH
10.0000 mL | INTRAVENOUS | Status: DC | PRN
  Administered 2015-07-17 – 2015-07-22 (×4): 10 mL
  Filled 2015-07-17 (×3): qty 40

## 2015-07-17 MED ORDER — DILTIAZEM HCL 60 MG PO TABS
60.0000 mg | ORAL_TABLET | Freq: Two times a day (BID) | ORAL | Status: DC
  Administered 2015-07-18 – 2015-07-22 (×10): 60 mg via ORAL
  Filled 2015-07-17 (×12): qty 1

## 2015-07-17 MED ORDER — PREDNISONE 20 MG PO TABS
20.0000 mg | ORAL_TABLET | Freq: Every day | ORAL | Status: AC
  Administered 2015-07-18 – 2015-07-22 (×5): 20 mg via ORAL
  Filled 2015-07-17 (×7): qty 1

## 2015-07-17 MED ORDER — GABAPENTIN 100 MG PO CAPS
100.0000 mg | ORAL_CAPSULE | Freq: Every day | ORAL | Status: DC
  Administered 2015-07-18 – 2015-07-21 (×5): 100 mg via ORAL
  Filled 2015-07-17 (×6): qty 1

## 2015-07-17 MED ORDER — SODIUM POLYSTYRENE SULFONATE 15 GM/60ML PO SUSP
30.0000 g | Freq: Once | ORAL | Status: AC
  Administered 2015-07-17: 30 g via ORAL
  Filled 2015-07-17: qty 120

## 2015-07-17 MED ORDER — ACETAMINOPHEN 500 MG PO TABS: 500.0000 mg | ORAL_TABLET | Freq: Every day | ORAL | Status: DC | PRN

## 2015-07-17 MED ORDER — ENOXAPARIN SODIUM 60 MG/0.6ML ~~LOC~~ SOLN
1.0000 mg/kg | SUBCUTANEOUS | Status: DC
Start: 2015-07-18 — End: 2015-07-22
  Administered 2015-07-18 – 2015-07-22 (×5): 60 mg via SUBCUTANEOUS
  Filled 2015-07-17 (×5): qty 0.6

## 2015-07-17 MED ORDER — HYDROCODONE-ACETAMINOPHEN 5-325 MG PO TABS: 1.0000 | ORAL_TABLET | ORAL | Status: DC | PRN

## 2015-07-17 MED ORDER — PRO-STAT SUGAR FREE PO LIQD
30.0000 mL | Freq: Two times a day (BID) | ORAL | Status: DC
  Administered 2015-07-18 – 2015-07-21 (×7): 30 mL via ORAL
  Filled 2015-07-17 (×11): qty 30

## 2015-07-17 MED ORDER — ONDANSETRON HCL 4 MG PO TABS: 4.0000 mg | ORAL_TABLET | Freq: Four times a day (QID) | ORAL | Status: DC | PRN

## 2015-07-17 MED ORDER — SODIUM CHLORIDE 0.9 % IV SOLN: INTRAVENOUS | Status: AC

## 2015-07-17 MED ORDER — BISACODYL 5 MG PO TBEC: 5.0000 mg | DELAYED_RELEASE_TABLET | Freq: Every day | ORAL | Status: DC | PRN

## 2015-07-17 MED ORDER — SODIUM CHLORIDE 0.9% FLUSH: 3.0000 mL | Freq: Two times a day (BID) | INTRAVENOUS | Status: DC

## 2015-07-17 MED ORDER — ONDANSETRON HCL 4 MG/2ML IJ SOLN: 4.0000 mg | Freq: Four times a day (QID) | INTRAMUSCULAR | Status: DC | PRN

## 2015-07-17 NOTE — ED Provider Notes (Signed)
CSN: [id number]     Arrival date & time 07/17/15  1638 History   First MD Initiated Contact with Patient 07/17/15 1703     Chief Complaint  Patient presents with  . Dysuria     (Consider location/radiation/quality/duration/timing/severity/associated sxs/prior Treatment) HPI Comments: Patient here after reportedly having signs of a urinary tract infection at the nursing home where she standing up. Patient had a recent hospitalization for sacral decubital abscess as well as UTI. Was supposed to complete a 21 day course of medications. The daughter is unclear if this was done. No reported fever, vomiting, diarrhea. Patient denies abdominal chest pain. States she feels at her baseline. Review of the labs from a nursing facility shows that blood work from 3 days ago was positive for UTI. She is not currently taking antibiotics.  Patient is a 82 y.o. female presenting with dysuria. The history is provided by the patient and a relative.  Dysuria   Past Medical History  Diagnosis Date  . Hypertension   . Hypercholesterolemia   . CKD (chronic kidney disease)   . Coronary artery disease     a.  LHC (06/04/05): LHC done in [place] with high grade RCA => s/p BMS to RCA;  b.  Nuclear (09/14/09): Lexiscan; Inf infarct with mild peri-infarct ishemia, EF 52%; Low Risk.  Marland Kitchen GERD (gastroesophageal reflux disease)   . Chronic anemia   . Ischemic cardiomyopathy     a. Echo (07/26/13): Mild LVH, EF 35-40%, diff HK, inf AK, Gr 2 DD, Tr AI, mildly dilated Ao root, MAC, mild MR, mild LAE, mod reduced RVSF.  . Non-small cell carcinoma of lung ([place])     Stage IV  . Chronic fatigue 04/04/2015  . Chronic fatigue 04/04/2015   Past Surgical History  Procedure Laterality Date  . Hematoma evacuation  December 2006    groin  . Appendectomy    . Cataract extraction    . Hemorroidectomy    . Laminectomy     Family History  Problem Relation Age of Onset  . Heart failure Mother   . Lung cancer Sister   .  Lung cancer Brother   . Stomach cancer Brother   . Heart attack Neg Hx   . Stroke Neg Hx    Social History  Substance Use Topics  . Smoking status: Former Smoker    Types: Cigarettes  . Smokeless tobacco: Never Used  . Alcohol Use: No   OB History    No data available     Review of Systems  Genitourinary: Positive for dysuria.  All other systems reviewed and are negative.     Allergies  Amlodipine; Ciprofloxacin; Mirtazapine; Statins; Aspirin; Cephalexin; Hydralazine; Iron; Ambien; Lorazepam; Sulfonamide derivatives; Zolpidem; Penicillins; Shellfish allergy; and Tape  Home Medications   Prior to Admission medications   Medication Sig Start Date End Date Taking? Authorizing Provider  acetaminophen (TYLENOL) 500 MG tablet Take 500 mg by mouth daily as needed for mild pain.    Historical Provider, MD  Amino Acids-Protein Hydrolys (FEEDING SUPPLEMENT, PRO-STAT SUGAR FREE 64,) LIQD Take 30 mLs by mouth 2 (two) times daily. 07/02/15   Robbie Lis, MD  diphenhydrAMINE (BENADRYL) 25 mg capsule Take 1 capsule (25 mg total) by mouth every 6 (six) hours as needed. 07/03/15   Robbie Lis, MD  diphenoxylate-atropine (LOMOTIL) 2.5-0.025 MG per tablet Take 1 tablet by mouth 4 (four) times daily as needed for diarrhea or loose stools. 02/11/15   Wyatt Portela, MD  enoxaparin (  LOVENOX) 60 MG/0.6ML injection Inject 0.6 mLs (60 mg total) into the skin daily. 07/02/15   Robbie Lis, MD  ertapenem 0.5 g in sodium chloride 0.9 % 50 mL Inject 0.5 g into the vein daily. 07/02/15   Robbie Lis, MD  famotidine (PEPCID) 20 MG tablet Take 1 tablet (20 mg total) by mouth daily. 07/03/15   Robbie Lis, MD  furosemide (LASIX) 20 MG tablet Take 1 tablet (20 mg total) by mouth daily. 07/02/15   Robbie Lis, MD  gabapentin (NEURONTIN) 100 MG capsule Take 1 capsule (100 mg total) by mouth at bedtime. 07/02/15   Robbie Lis, MD  lidocaine-prilocaine (EMLA) cream Apply 1 application topically as needed.  03/17/15   Curt Bears, MD  loperamide (IMODIUM) 2 MG capsule Take 1 capsule (2 mg total) by mouth every 6 (six) hours as needed for diarrhea or loose stools. 07/02/15   Robbie Lis, MD  metoprolol tartrate (LOPRESSOR) 25 MG tablet Take 0.5 tablets (12.5 mg total) by mouth 2 (two) times daily. 07/02/15   Robbie Lis, MD  pantoprazole (PROTONIX) 40 MG tablet TAKE ONE TABLET BY MOUTH ONCE DAILY. 02/07/15   Sherren Mocha, MD  potassium chloride SA (K-DUR,KLOR-CON) 20 MEQ tablet Take 1 tablet (20 mEq total) by mouth daily. 03/17/15   Owens Shark, NP  predniSONE (DELTASONE) 5 MG tablet Take 10 tablets (50 mg total) by mouth daily with breakfast. 07/03/15   Robbie Lis, MD  SANTYL ointment Apply 1 application topically at bedtime as needed. For bed sores 12/27/14   Historical Provider, MD  sodium bicarbonate 650 MG tablet Take 1 tablet (650 mg total) by mouth daily. 07/02/15   Robbie Lis, MD   BP 156/78 mmHg  Pulse 74  Temp(Src) 97.8 F (36.6 C) (Oral)  SpO2 98% Physical Exam  Constitutional: She is oriented to person, place, and time. She appears well-developed and well-nourished.  Non-toxic appearance. No distress.  HENT:  Head: Normocephalic and atraumatic.  Eyes: Conjunctivae, EOM and lids are normal. Pupils are equal, round, and reactive to light.  Neck: Normal range of motion. Neck supple. No tracheal deviation present. No thyroid mass present.  Cardiovascular: Normal rate, regular rhythm and normal heart sounds.  Exam reveals no gallop.   No murmur heard. Pulmonary/Chest: Effort normal and breath sounds normal. No stridor. No respiratory distress. She has no decreased breath sounds. She has no wheezes. She has no rhonchi. She has no rales.  Abdominal: Soft. Normal appearance and bowel sounds are normal. She exhibits no distension. There is no tenderness. There is no rebound and no CVA tenderness.  Musculoskeletal: Normal range of motion. She exhibits no edema or tenderness.   Neurological: She is alert and oriented to person, place, and time. She has normal strength. No cranial nerve deficit or sensory deficit. GCS eye subscore is 4. GCS verbal subscore is 5. GCS motor subscore is 6.  Skin: Skin is warm and dry. No abrasion and no rash noted.  Psychiatric: She has a normal mood and affect. Her speech is normal and behavior is normal.  Nursing note and vitals reviewed.   ED Course  Procedures (including critical care time) Labs Review Labs Reviewed  URINE CULTURE  URINALYSIS, ROUTINE W REFLEX MICROSCOPIC (NOT AT [facility])  CBC WITH DIFFERENTIAL/PLATELET  BASIC METABOLIC PANEL    Imaging Review No results found. I have personally reviewed and evaluated these images and lab results as part of my medical decision-making.  EKG Interpretation None      MDM   Final diagnoses:  None   Pt given kaexylate for hyperkalemia  abx given for uti  Admit to medicine       Lacretia Leigh, MD 07/17/15 2024

## 2015-07-17 NOTE — ED Notes (Signed)
Critical K+ - 6.2.  abbie RN and [facility] notified

## 2015-07-17 NOTE — ED Notes (Signed)
Pt was here in mid January for UTI and was transferred after discharge to [facility] in [place] for rehab. Pt was supposed to be getting IV antibiotics but has not been getting them at the facility. Pt has not complaints but daughter is concerned that UTI has reoccurred. Alert and oriented.

## 2015-07-17 NOTE — H&P (Signed)
[facility] History and Physical  [name] LOV:[id number] DOB: [birth year] DOA: 07/17/2015  Referring physician: ED physician PCP: Wende Neighbors, MD  Specialists:  Dr. Julien Nordmann (oncology)   Chief Complaint:  Dysuria   HPI: [name] is a 82 y.o. female with PMH of hypertension, chronic kidney disease stage III, stage IV non-small cell lung cancer with metastasis to the lumbar spine, and pressure sores who presents from her SNF with dysuria. Patient was discharged from this institution on 07/03/2015 following a protracted hospital course which involved treatment of sacral wound with polymicrobial infection and associated abscess, polymicrobial UTI, DVT in the right leg and jugular vein, and acute perioral swelling. Infectious disease consultation was obtained during the recent hospitalization and the patient was discharged to complete a 28 day course of IV ertapenem. Unfortunately, antimicrobial therapy at the SNF was delayed, and per review of SNF notes, she may have been treated with a seven-day course of IM ertapenem. Urinalysis from the SNF, obtained on 07/14/2015, is consistent with infection. The patient was not on antibiotics at that time, or at the time of admission.  One of two cultures drawn from her Port-A-Cath during the last hospitalization was positive for Klebsiella, raising concern for seeding of the catheter. The plan at time of recent discharge was to repeat blood cultures following the prescribed antibiotic course, and if positive, pull the catheter.  In ED, patient was found to be afebrile, saturating well on room air, and with vital signs stable. CBC was obtained and notable for a stable normocytic anemia. BMP is notable for hyperkalemia with potassium of 6.2, and chronic renal insufficiency with serum creatinine of 2.02, up from an apparent baseline of 1.8. Patient was given Kayexalate by mouth and normal saline was infused at 125 cc/hr. patient remained hemodynamically  stable in the emergency department and will be admitted to telemetry unit for ongoing evaluation and management of hyperkalemia, UTI, and concern for possible catheter-related bloodstream infection.  Where does patient live?   SNF      Can patient participate in ADLs?  Some   Review of Systems:   General: no fevers, chills, sweats, weight change, poor appetite, or fatigue HEENT: no blurry vision, hearing changes or sore throat Pulm: no dyspnea, cough, or wheeze CV: no chest pain or palpitations Abd: no nausea, vomiting, abdominal pain, diarrhea, or constipation GU: no hematuria, increased urinary frequency, or urgency. Dysuria  Ext: no leg edema Neuro: no focal weakness, numbness, or tingling, no vision change or hearing loss Skin: no rash. Pressure wounds on sacrum and right hip MSK: No muscle spasm, no deformity, no red, hot, or swollen joint Heme: No easy bruising or bleeding Travel history: No recent long distant travel    Allergy:  Allergies  Allergen Reactions  . Amlodipine Swelling  . Ciprofloxacin Hives and Other (See Comments)    REACTION: weakness  . Mirtazapine Other (See Comments)    Hallucinations and nightmares, verbally aggressive   . Statins Other (See Comments)  . Aspirin Swelling    Mouth swelling, tongue Patient reports that she tolerates ibuprofen without problem  . Cephalexin Hives  . Hydralazine Nausea And Vomiting  . Iron Nausea And Vomiting  . Ambien [Zolpidem Tartrate] Other (See Comments)    confusion  . Lorazepam Other (See Comments)    Hallucinations, verbally aggressive  . Sulfonamide Derivatives Hives  . Zolpidem     Confusion  . Penicillins Other (See Comments)    Has patient had a PCN reaction  causing immediate rash, facial/tongue/throat swelling, SOB or lightheadedness with hypotension: Yes Has patient had a PCN reaction causing severe rash involving mucus membranes or skin necrosis: Yes Has patient had a PCN reaction that required  hospitalization No Has patient had a PCN reaction occurring within the last 10 years: No If all of the above answers are "NO", then may proceed with Cephalosporin use.   . Shellfish Allergy Hives and Rash  . Tape Rash    Past Medical History  Diagnosis Date  . Hypertension   . Hypercholesterolemia   . CKD (chronic kidney disease)   . Coronary artery disease     a.  LHC (06/04/05): LHC done in [place] with high grade RCA => s/p BMS to RCA;  b.  Nuclear (09/14/09): Lexiscan; Inf infarct with mild peri-infarct ishemia, EF 52%; Low Risk.  Marland Kitchen GERD (gastroesophageal reflux disease)   . Chronic anemia   . Ischemic cardiomyopathy     a. Echo (07/26/13): Mild LVH, EF 35-40%, diff HK, inf AK, Gr 2 DD, Tr AI, mildly dilated Ao root, MAC, mild MR, mild LAE, mod reduced RVSF.  . Non-small cell carcinoma of lung ([place])     Stage IV  . Chronic fatigue 04/04/2015  . Chronic fatigue 04/04/2015    Past Surgical History  Procedure Laterality Date  . Hematoma evacuation  December 2006    groin  . Appendectomy    . Cataract extraction    . Hemorroidectomy    . Laminectomy      Social History:  reports that she has quit smoking. Her smoking use included Cigarettes. She has never used smokeless tobacco. She reports that she does not drink alcohol or use illicit drugs.  Family History:  Family History  Problem Relation Age of Onset  . Heart failure Mother   . Lung cancer Sister   . Lung cancer Brother   . Stomach cancer Brother   . Heart attack Neg Hx   . Stroke Neg Hx      Prior to Admission medications   Medication Sig Start Date End Date Taking? Authorizing Provider  acetaminophen (TYLENOL) 500 MG tablet Take 500 mg by mouth daily as needed for mild pain.   Yes Historical Provider, MD  Amino Acids-Protein Hydrolys (FEEDING SUPPLEMENT, PRO-STAT SUGAR FREE 64,) LIQD Take 30 mLs by mouth 2 (two) times daily. 07/02/15  Yes Robbie Lis, MD  diltiazem (CARDIZEM) 60 MG tablet Take 60 mg by  mouth 2 (two) times daily.   Yes Historical Provider, MD  diphenhydrAMINE (BENADRYL) 25 mg capsule Take 1 capsule (25 mg total) by mouth every 6 (six) hours as needed. Patient taking differently: Take 25 mg by mouth every 6 (six) hours as needed for allergies.  07/03/15  Yes Robbie Lis, MD  enoxaparin (LOVENOX) 60 MG/0.6ML injection Inject 0.6 mLs (60 mg total) into the skin daily. 07/02/15  Yes Robbie Lis, MD  famotidine (PEPCID) 20 MG tablet Take 1 tablet (20 mg total) by mouth daily. 07/03/15  Yes Robbie Lis, MD  furosemide (LASIX) 20 MG tablet Take 1 tablet (20 mg total) by mouth daily. Patient taking differently: Take 30 mg by mouth daily.  07/02/15  Yes Robbie Lis, MD  gabapentin (NEURONTIN) 100 MG capsule Take 1 capsule (100 mg total) by mouth at bedtime. 07/02/15  Yes Robbie Lis, MD  lidocaine-prilocaine (EMLA) cream Apply 1 application topically as needed. 03/17/15  Yes Curt Bears, MD  loperamide (IMODIUM) 2 MG capsule Take 1  capsule (2 mg total) by mouth every 6 (six) hours as needed for diarrhea or loose stools. 07/02/15  Yes Robbie Lis, MD  metoprolol tartrate (LOPRESSOR) 25 MG tablet Take 0.5 tablets (12.5 mg total) by mouth 2 (two) times daily. 07/02/15  Yes Robbie Lis, MD  pantoprazole (PROTONIX) 40 MG tablet TAKE ONE TABLET BY MOUTH ONCE DAILY. 02/07/15  Yes Sherren Mocha, MD  potassium chloride SA (K-DUR,KLOR-CON) 20 MEQ tablet Take 1 tablet (20 mEq total) by mouth daily. 03/17/15  Yes Owens Shark, NP  predniSONE (DELTASONE) 10 MG tablet Take 10-50 mg by mouth See admin instructions. Prednisone taper Currently finished '30mg'$  daily on 07/17/15  Start '20mg'$  on 07/18/15 for five days Then take '10mg'$  daily until d/c for lung cancer   Yes Historical Provider, MD  SANTYL ointment Apply 1 application topically at bedtime as needed. For bed sores 12/27/14  Yes Historical Provider, MD  diphenoxylate-atropine (LOMOTIL) 2.5-0.025 MG per tablet Take 1 tablet by mouth 4 (four)  times daily as needed for diarrhea or loose stools. Patient not taking: Reported on 07/17/2015 02/11/15   Wyatt Portela, MD  ertapenem 0.5 g in sodium chloride 0.9 % 50 mL Inject 0.5 g into the vein daily. 07/02/15   Robbie Lis, MD  predniSONE (DELTASONE) 5 MG tablet Take 10 tablets (50 mg total) by mouth daily with breakfast. Patient not taking: Reported on 07/17/2015 07/03/15   Robbie Lis, MD  sodium bicarbonate 650 MG tablet Take 1 tablet (650 mg total) by mouth daily. Patient not taking: Reported on 07/17/2015 07/02/15   Robbie Lis, MD    Physical Exam: Filed Vitals:   07/17/15 1657 07/17/15 1853 07/17/15 2125  BP: 156/78 164/77 169/73  Pulse: 74 83 88  Temp: 97.8 F (36.6 C) 97.7 F (36.5 C) 97.5 F (36.4 C)  TempSrc: Oral Oral Oral  Resp:  17 18  SpO2: 98% 96% 99%   General: Not in acute distress HEENT:       Eyes: PERRL, EOMI, no scleral icterus or conjunctival pallor.       ENT: No discharge from the ears or nose, no pharyngeal ulcers, petechiae or exudate.        Neck: No JVD, no bruit, no appreciable mass Heme: No cervical adenopathy, no pallor Cardiac: S1/S2, RRR, No murmurs, No gallops or rubs. Pulm: Good air movement bilaterally. No rales, wheezing, rhonchi or rubs. Abd: Soft, nondistended, mild tenderness in lower quadrants, no rebound pain or gaurding, no mass or organomegaly, BS present. Ext: No LE edema bilaterally. 2+DP/PT pulse bilaterally. Musculoskeletal: No gross deformity, no red, hot, swollen joints   Skin: No rashes on exposed surfaces. Scattered superficial abrasions. Sacrum and right hip with open sores.  Neuro: Alert, oriented to person and place, not time, cranial nerves II-XII grossly intact. No focal findings Psych: Patient is not overtly psychotic, appropriate mood and affect.  Labs on Admission:  Basic Metabolic Panel:  Recent Labs Lab 07/17/15 1805  NA 139  K 6.2*  CL 110  CO2 22  GLUCOSE 137*  BUN 74*  CREATININE 2.02*  CALCIUM  7.7*   Liver Function Tests: No results for input(s): AST, ALT, ALKPHOS, BILITOT, PROT, ALBUMIN in the last 168 hours. No results for input(s): LIPASE, AMYLASE in the last 168 hours. No results for input(s): AMMONIA in the last 168 hours. CBC:  Recent Labs Lab 07/17/15 1805  WBC 8.1  NEUTROABS 7.5  HGB 8.5*  HCT 26.2*  MCV 88.8  PLT 208   Cardiac Enzymes: No results for input(s): CKTOTAL, CKMB, CKMBINDEX, TROPONINI in the last 168 hours.  BNP (last 3 results) No results for input(s): BNP in the last 8760 hours.  ProBNP (last 3 results) No results for input(s): PROBNP in the last 8760 hours.  CBG: No results for input(s): GLUCAP in the last 168 hours.  Radiological Exams on Admission: No results found.  EKG:  Ordered and pending    Assessment/Plan  1. UTI  - Culture sent, will follow-up - Pt with multiple reported antibiotic allergies  - May need to place back on a carbapenem; pt is stable from ID perspective, will defer abx selection for now  2. Pressure wounds  - Sacrum and right hip involved  - Polymicrobial bony involvement noted at recent admission  - Wound care consultation requested   3. Hyperkalemia - Potassium of 6.2 on arrival  - Suspect multifactorial, including contributions from exogenous potassium supplementation, CKD III, and dehydration  - Stop potassium supplements  - IVF given at 125 cc/hr  - Is hypocalcemic to 7.7 on admission; IV calcium given  - Kayexalate PO x1  - Anticipate resolution with above measures  - Repeat Chem panel in am   4. Stage 4 non-small cell lung cancer  - Managed by Dr. Julien Nordmann, currently on immunotherapy with Keytruda  - Metastasis to L5  - Appears stable    5. Anemia of chronic disease - Hgb 8.5 on admission, consistent with her baseline  - No suggestion of active blood loss  - Monitor    6. DVT, right leg - Will continue treatment with 1 mg/kg Lovenox qD - No suggestion of expansion or embolism to lung      DVT ppx: Continue treatment-dose Lovenox   Code Status: Full code Family Communication:  Yes, patient's daughter and grandson at bed side Disposition Plan: Admit to inpatient   Date of Service 07/17/2015    Vianne Bulls, MD [facility] Pager [phone]  If 7PM-7AM, please contact night-coverage [email or web address] Password [facility] 07/17/2015, 9:31 PM

## 2015-07-17 NOTE — ED Notes (Signed)
MD at bedside. 

## 2015-07-17 NOTE — ED Notes (Signed)
Bed: WA07 Expected date:  Expected time:  Means of arrival:  Comments: EMS- elderly, uti/dementia

## 2015-07-18 DIAGNOSIS — J90 Pleural effusion, not elsewhere classified: Secondary | ICD-10-CM

## 2015-07-18 DIAGNOSIS — I82409 Acute embolism and thrombosis of unspecified deep veins of unspecified lower extremity: Secondary | ICD-10-CM

## 2015-07-18 LAB — CBC WITH DIFFERENTIAL/PLATELET
BASOS PCT: 0 %
Basophils Absolute: 0 10*3/uL (ref 0.0–0.1)
EOS ABS: 0.1 10*3/uL (ref 0.0–0.7)
Eosinophils Relative: 2 %
HEMATOCRIT: 28.5 % — AB (ref 36.0–46.0)
HEMOGLOBIN: 9.1 g/dL — AB (ref 12.0–15.0)
Lymphocytes Relative: 9 %
Lymphs Abs: 0.6 10*3/uL — ABNORMAL LOW (ref 0.7–4.0)
MCH: 29.4 pg (ref 26.0–34.0)
MCHC: 31.9 g/dL (ref 30.0–36.0)
MCV: 91.9 fL (ref 78.0–100.0)
Monocytes Absolute: 0.6 10*3/uL (ref 0.1–1.0)
Monocytes Relative: 8 %
NEUTROS ABS: 5.5 10*3/uL (ref 1.7–7.7)
NEUTROS PCT: 81 %
Platelets: 238 10*3/uL (ref 150–400)
RBC: 3.1 MIL/uL — AB (ref 3.87–5.11)
RDW: 17.5 % — ABNORMAL HIGH (ref 11.5–15.5)
WBC: 6.8 10*3/uL (ref 4.0–10.5)

## 2015-07-18 LAB — BASIC METABOLIC PANEL
ANION GAP: 10 (ref 5–15)
BUN: 65 mg/dL — ABNORMAL HIGH (ref 6–20)
CHLORIDE: 113 mmol/L — AB (ref 101–111)
CO2: 22 mmol/L (ref 22–32)
CREATININE: 1.91 mg/dL — AB (ref 0.44–1.00)
Calcium: 8.8 mg/dL — ABNORMAL LOW (ref 8.9–10.3)
GFR calc non Af Amer: 24 mL/min — ABNORMAL LOW (ref >60)
GFR, EST AFRICAN AMERICAN: 28 mL/min — AB (ref >60)
Glucose, Bld: 105 mg/dL — ABNORMAL HIGH (ref 65–99)
POTASSIUM: 5.3 mmol/L — AB (ref 3.5–5.1)
SODIUM: 145 mmol/L (ref 135–145)

## 2015-07-18 LAB — OSMOLALITY: Osmolality: 318 mOsm/kg — ABNORMAL HIGH (ref 275–295)

## 2015-07-18 LAB — MRSA PCR SCREENING: MRSA by PCR: NEGATIVE

## 2015-07-18 LAB — POTASSIUM
POTASSIUM: 4.9 mmol/L (ref 3.5–5.1)
POTASSIUM: 5.2 mmol/L — AB (ref 3.5–5.1)
Potassium: 5.6 mmol/L — ABNORMAL HIGH (ref 3.5–5.1)
Potassium: 5.3 mmol/L — ABNORMAL HIGH (ref 3.5–5.1)
Potassium: 5.4 mmol/L — ABNORMAL HIGH (ref 3.5–5.1)

## 2015-07-18 LAB — POTASSIUM, URINE RANDOM: POTASSIUM UR: 25 mmol/L

## 2015-07-18 LAB — GLUCOSE, CAPILLARY: GLUCOSE-CAPILLARY: 75 mg/dL (ref 65–99)

## 2015-07-18 LAB — LACTIC ACID, PLASMA: LACTIC ACID, VENOUS: 1.4 mmol/L (ref 0.5–2.0)

## 2015-07-18 LAB — SODIUM, URINE, RANDOM: SODIUM UR: 122 mmol/L

## 2015-07-18 LAB — OSMOLALITY, URINE: OSMOLALITY UR: 410 mosm/kg (ref 300–900)

## 2015-07-18 LAB — CREATININE, URINE, RANDOM: CREATININE, URINE: 17.37 mg/dL

## 2015-07-18 LAB — PROCALCITONIN: Procalcitonin: 1.1 ng/mL

## 2015-07-18 MED ORDER — SODIUM CHLORIDE 0.9 % IV SOLN
1.0000 g | INTRAVENOUS | Status: DC
  Administered 2015-07-18: 1 g via INTRAVENOUS
  Filled 2015-07-18 (×2): qty 1

## 2015-07-18 MED ORDER — SODIUM CHLORIDE 0.9 % IV SOLN
INTRAVENOUS | Status: DC
  Administered 2015-07-18: 13:00:00 via INTRAVENOUS

## 2015-07-18 NOTE — Progress Notes (Signed)
Patient ID: [name], female   DOB: January 25, 1934, 82 y.o.   MRN: [id number] [facility] PROGRESS NOTE  [name] QHU:[id number] DOB: 06-Oct-1933 DOA: 07/17/2015 PCP: Wende Neighbors, MD  Brief narrative:    82 y.o. female with PMH of hypertension, chronic kidney disease stage III, stage IV non-small cell lung cancer with metastasis to the lumbar spine, and pressure sores who presents from her SNF with dysuria. Patient was discharged from this institution on 07/03/2015 following a protracted hospital course which involved treatment of sacral wound with polymicrobial infection and associated abscess, polymicrobial UTI, DVT in the right leg and jugular vein, and acute perioral swelling. Infectious disease consultation was obtained during the recent hospitalization and the patient was discharged to complete additional 14 days of antibiotics however per review he doesn't seem that patient continue full course of the antibiotics. The plan was also to repeat blood cultures and if any bacteremia the catheter should be pulled out.  On this admission, she was hemodynamically stable but on admission was found to have hyperkalemia and required treatment with Kayexalate and IV fluids with subsequent improvement in potassium. Her creatinine was also slightly up from her baseline of 1.8.  Assessment/Plan:    UTI - in pt with ESBL and reports of dysuria it is reasonable to start ertapenem - F/U blood and urine culture results and narrow down if all cultures negative   Hyperkalemia - Now WNL  Bilateral pleural effusion - 2 D ECHO LV EF 50-55% with grade I diastolic dysfunction  Acute on chronic kidney disease, stage III/IV with history of right sided hydronephrosis - S/p percutaneous nephrostomy tube in 11/2014 which later was removed  - Cr 1.8 on last discharge and on this admission 2.02 likely elevated from prerenal etiology, dehydration  DVT history  - Noted on US doppler 06/29/2015  - Noted  also DVT in right common femoral vein, right femoral vein, right profunda femoral vein, right popliteal vein, and right proximal perone al veins.. - Continue Lovenox   Stage IV lung cancer, metastatic  - Follows with Dr. Julien Nordmann   Anemia of chronic disease - Secondary to history of malignancy  CAD s/p stent in 2007 - Stable  Essential hypertension - Continue metoprolol and Cardizem  New onset of afib  - CHADS vasc score 4 - Noted on previous admission in January 2017 - On [facility] with Lovenox - Rate controlled with metoprolol and Cardizem  DVT Prophylaxis  - On anticoagulation with Lovenox   Code Status: Full.  Family Communication:  plan of care discussed with the patient Disposition Plan: To skilled nursing facility probably by 07/20/2015  IV access:  Peripheral IV  Procedures and diagnostic studies:    No results found.  Medical Consultants:  None   Other Consultants:  PT  IAnti-Infectives:   None    Leisa Lenz, MD  [facility] Pager [phone]  Time spent in minutes: 25 minutes  If 7PM-7AM, please contact night-coverage [email or web address] Password [facility] 07/18/2015, 12:23 PM   LOS: 1 day    HPI/Subjective: No acute overnight events. Patient reports she feels better.   Objective: Filed Vitals:   07/17/15 2200 07/17/15 2300 07/18/15 0000 07/18/15 0620  BP: 153/72 156/64  154/82  Pulse: 79 93  88  Temp:  97.7 F (36.5 C)  97.8 F (36.6 C)  TempSrc:  Oral  Oral  Resp: '16 18  18  '$ Height:   '5\' 8"'$  (1.727 m)   SpO2: 98% 98%  98%  Intake/Output Summary (Last 24 hours) at 07/18/15 1223 Last data filed at 07/18/15 0900  Gross per 24 hour  Intake 1072.5 ml  Output    700 ml  Net  372.5 ml    Exam:   General:  Pt is alert, not in acute distress  Cardiovascular: Regular rate and rhythm, S1/S2 (+)  Respiratory: no wheezing, no crackles, no rhonchi  Abdomen: Soft, non tender, non distended, bowel sounds present  Extremities: No edema,  pulses DP and PT palpable bilaterally  Neuro: Grossly nonfocal  Data Reviewed: Basic Metabolic Panel:  Recent Labs Lab 07/17/15 1805 07/17/15 2324 07/18/15 0320 07/18/15 0750  NA 139  --  145  --   K 6.2* 5.6* 5.3* 4.9  CL 110  --  113*  --   CO2 22  --  22  --   GLUCOSE 137*  --  105*  --   BUN 74*  --  65*  --   CREATININE 2.02*  --  1.91*  --   CALCIUM 7.7*  --  8.8*  --    Liver Function Tests: No results for input(s): AST, ALT, ALKPHOS, BILITOT, PROT, ALBUMIN in the last 168 hours. No results for input(s): LIPASE, AMYLASE in the last 168 hours. No results for input(s): AMMONIA in the last 168 hours. CBC:  Recent Labs Lab 07/17/15 1805 07/18/15 0320  WBC 8.1 6.8  NEUTROABS 7.5 5.5  HGB 8.5* 9.1*  HCT 26.2* 28.5*  MCV 88.8 91.9  PLT 208 238   Cardiac Enzymes: No results for input(s): CKTOTAL, CKMB, CKMBINDEX, TROPONINI in the last 168 hours. BNP: Invalid input(s): POCBNP CBG:  Recent Labs Lab 07/18/15 0806  GLUCAP 75    Recent Results (from the past 240 hour(s))  MRSA PCR Screening     Status: None   Collection Time: 07/17/15 11:25 PM  Result Value Ref Range Status   MRSA by PCR NEGATIVE NEGATIVE Final    Comment:        The GeneXpert MRSA Assay (FDA approved for NASAL specimens only), is one component of a comprehensive MRSA colonization surveillance program. It is not intended to diagnose MRSA infection nor to guide or monitor treatment for MRSA infections.      Scheduled Meds: . diltiazem  60 mg Oral BID  . enoxaparin  1 mg/kg Subcutaneous Q24H  . feeding supplement (ENSURE ENLIVE)  237 mL Oral BID BM  . feeding supplement (PRO-STAT SUGAR FREE 64)  30 mL Oral BID  . gabapentin  100 mg Oral QHS  . metoprolol tartrate  12.5 mg Oral BID  . pantoprazole  40 mg Oral Daily  . predniSONE  20 mg Oral Q breakfast  . sodium chloride flush  3 mL Intravenous Q12H

## 2015-07-18 NOTE — Progress Notes (Signed)
Initial Nutrition Assessment  DOCUMENTATION CODES:   Non-severe (moderate) malnutrition in context of chronic illness  INTERVENTION:  -Pro-Stat 8m BID, each supplement provides 100 calories, and 15 grams of protein   NUTRITION DIAGNOSIS:   Malnutrition related to chronic illness as evidenced by severe depletion of muscle mass, severe depletion of body fat.  GOAL:   Patient will meet greater than or equal to 90% of their needs  MONITOR:   PO intake, Supplement acceptance, Labs, Weight trends, Skin, I & O's  REASON FOR ASSESSMENT:   Malnutrition Screening Tool    ASSESSMENT:   [name] a 82y.o. female with PMH of hypertension, chronic kidney disease stage III, stage IV non-small cell lung cancer with metastasis to the lumbar spine, and pressure sores who presents from her SNF with dysuria  Spoke with pt at bedside. Endorses eating well. No complaints of nausea/vomiting/constipation/diarrhea at this time. No evidence of weight loss, though we do not have a new weight on her from her previous admission.  PO intake 50-80% so far. Pt stated she ate most of her breakfast, but just couldn't finish it all.   Nutrition-Focused physical exam completed. Findings are moderate fat depletion, moderate muscle depletion, and mild edema.   She has multiple wounds and cancer that have increased her overall calorie needs. Will provide Pro-stat to help increase protein intake. From previous admits, pt does not like ensure, will not provide.  Labs and medications reviewed.  Diet Order:  Diet regular Room service appropriate?: Yes; Fluid consistency:: Thin  Skin:   Stg II PU to Hip, PU to Sacrum  Last BM:  PTA  Height:   Ht Readings from Last 1 Encounters:  07/18/15 '5\' 8"'$  (1.727 m)    Weight:   Wt Readings from Last 1 Encounters:  06/16/15 132 lb 4.4 oz (60 kg)    Ideal Body Weight:  63.6 kg  BMI:  There is no weight on file to calculate BMI.  Estimated  Nutritional Needs:   Kcal:  1800-2000  Protein:  85-95  Fluid:  >/= 2L  EDUCATION NEEDS:   Education needs addressed  [name] Anis Shardea Cwynar, MS, RD LDN After Hours/Weekend Pager [phone]

## 2015-07-18 NOTE — Care Management Note (Signed)
Case Management Note  Patient Details  Name: [name] MRN: [id number] Date of Birth: 1933/06/19  Subjective/Objective:     82 yo admitted with UTI.               Action/Plan: Pt is currently active with [facility] services.  Will need MD orders for resumption of care at DC.  Expected Discharge Date:   (unknown)               Expected Discharge Plan:  [place]  In-House Referral:     Discharge planning Services  CM Consult  Post Acute Care Choice:    Choice offered to:     DME Arranged:    DME Agency:     HH Arranged:  RN, PT HH Agency:  [place]  Status of Service:  In process, will continue to follow  Medicare Important Message Given:    Date Medicare IM Given:    Medicare IM give by:    Date Additional Medicare IM Given:    Additional Medicare Important Message give by:     If discussed at [place] of Stay Meetings, dates discussed:    Additional Comments:  Lynnell Catalan, RN 07/18/2015, 1:58 PM

## 2015-07-18 NOTE — Consult Note (Signed)
WOC wound consult note Reason for Consult:Chronic Stage 4 on sacrum, acute skin tears on left LE and left arm, resolving Stage 3 PrIs on right hip. Known to me from previous admissions.  Last seen approximately one month ago. Wound type:Pressure, trauma Pressure Ulcer POA: Yes Measurement: Sacrum, Stage 4: 5cm x 5cm x 0.4cm with undermining at 12 o'clock measuring 0.5cm.  Right hip with two ulcers, granulating:  Proximal measures cm x 1cm x 0.2cm and distal measures 1cm x 0.5cm x 0.2cm. Left heel with Stage 1 Pressure Injury measuring 3cm round with no depth.  Skin tears on left LE measures 1.5cm x 1cm x 0.2cm, on left upper arm measures 1.5cm x 3cm x 0.2cm Wound OIT:GPQDIY is pale pink, nongranulating.  Right hip is clean, pink and granulating. Skin tears are avulsions and are moist, and red. Drainage (amount, consistency, odor) Moderate amount of light yellow exudate from sacrum and scant serous from right hip.  Serosanguinous from skin tears occurring yesterday. Periwound:intact. Dressing procedure/placement/frequency:I have provided nursing with guidance for daily wound care to the sacrum, twice weekly care to the sacrum and right hip.Prevalon boots are provided to prevent pressure injury and a mattress with low air loss feature as she has two affected turning surfaces. [place] nursing team will not follow, but will remain available to this patient, the nursing and medical teams.  Please re-consult if needed. Thanks, Maudie Flakes, MSN, RN, [place], Ar[facility] Abbott  Pager# [phone]

## 2015-07-18 NOTE — Progress Notes (Signed)
Pt has a stage 4 pressure ulcer on sacrum.  5cm by 3.5cm, foam applied until [facility] nurse can see today. Has large area of Incontinence associated damage surrounding sacrum.  Pt has healing pressure ulcer on right trochanter 1cm by 1.5cm, foam applied. Healing pressure ulcer on right lateral malleolus, foam applied. Has multiple skin tears with adhesive dsgs causing worsening skin tears.  Has two dsgs on left leg near the knee, left forearm wrapped as well as upper left arm has dried dsg applied over a skin tear.  These dsgs left intact this morning until [place] visit.  Both heels are boggy and reddened. Heels elevated. Pt could benefit from specialty mattress. Fara Olden P

## 2015-07-19 DIAGNOSIS — I480 Paroxysmal atrial fibrillation: Secondary | ICD-10-CM

## 2015-07-19 DIAGNOSIS — I5032 Chronic diastolic (congestive) heart failure: Secondary | ICD-10-CM

## 2015-07-19 DIAGNOSIS — N183 Chronic kidney disease, stage 3 (moderate): Secondary | ICD-10-CM

## 2015-07-19 DIAGNOSIS — I4891 Unspecified atrial fibrillation: Secondary | ICD-10-CM | POA: Diagnosis present

## 2015-07-19 DIAGNOSIS — I5033 Acute on chronic diastolic (congestive) heart failure: Secondary | ICD-10-CM | POA: Diagnosis present

## 2015-07-19 DIAGNOSIS — I82401 Acute embolism and thrombosis of unspecified deep veins of right lower extremity: Secondary | ICD-10-CM

## 2015-07-19 DIAGNOSIS — N179 Acute kidney failure, unspecified: Secondary | ICD-10-CM

## 2015-07-19 LAB — GLUCOSE, CAPILLARY: GLUCOSE-CAPILLARY: 75 mg/dL (ref 65–99)

## 2015-07-19 LAB — COMPREHENSIVE METABOLIC PANEL
ALBUMIN: 2 g/dL — AB (ref 3.5–5.0)
ALT: 75 U/L — ABNORMAL HIGH (ref 14–54)
ANION GAP: 9 (ref 5–15)
AST: 48 U/L — AB (ref 15–41)
Alkaline Phosphatase: 158 U/L — ABNORMAL HIGH (ref 38–126)
BILIRUBIN TOTAL: 0.6 mg/dL (ref 0.3–1.2)
BUN: 64 mg/dL — AB (ref 6–20)
CHLORIDE: 113 mmol/L — AB (ref 101–111)
CO2: 22 mmol/L (ref 22–32)
Calcium: 7.7 mg/dL — ABNORMAL LOW (ref 8.9–10.3)
Creatinine, Ser: 1.88 mg/dL — ABNORMAL HIGH (ref 0.44–1.00)
GFR calc Af Amer: 28 mL/min — ABNORMAL LOW (ref >60)
GFR calc non Af Amer: 24 mL/min — ABNORMAL LOW (ref >60)
GLUCOSE: 132 mg/dL — AB (ref 65–99)
POTASSIUM: 4.7 mmol/L (ref 3.5–5.1)
SODIUM: 144 mmol/L (ref 135–145)
TOTAL PROTEIN: 5.1 g/dL — AB (ref 6.5–8.1)

## 2015-07-19 LAB — POTASSIUM
POTASSIUM: 4.6 mmol/L (ref 3.5–5.1)
Potassium: 4.4 mmol/L (ref 3.5–5.1)

## 2015-07-19 MED ORDER — FOSFOMYCIN TROMETHAMINE 3 G PO PACK
3.0000 g | PACK | Freq: Once | ORAL | Status: AC
  Administered 2015-07-19: 3 g via ORAL
  Filled 2015-07-19: qty 3

## 2015-07-19 NOTE — Progress Notes (Signed)
Rehab Admissions Coordinator Note:  Patient was screened by Retta Diones for appropriateness for an Inpatient Acute Rehab Consult.  At this time, we are recommending [place].  Given current diagnosis, it is very unlikely that [facility] insurance would authorize an acute inpatient rehab stay.  Recommend pursuit of SNF placement post discharge.  Retta Diones 07/19/2015, 10:24 AM  I can be reached at [phone].

## 2015-07-19 NOTE — Progress Notes (Addendum)
[facility] Progress Note  Patient: [name] ZOX:[id number]   PCP: Wende Neighbors, MD DOB: Oct 15, 1933   DOA: 07/17/2015   DOS: 07/19/2015   Date of Service: the patient was seen and examined on 07/19/2015  Subjective: No acute complaint of nausea and vomiting. No burning urination. No fever no chills. Nutrition: Tolerating oral diet Activity: Ambulating with the therapy Last BM: 07/18/2015  Assessment and Plan: 1. UTI (lower urinary tract infection)  Blood culture no growth for 1 day Urine gram-positive rods. Prior history of Escherichia coli as well as Proteus. Patient was initially started on ertapenem, although at present after one discussion with the pharmacist the patient will be transitioned to fosfomycin. Monitor the culture.  2. Acute on chronic kidney disease stage III. Dehydration. Hyperkalemia. Likely due to dehydration. Improving with IV hydration.  3. Paraspinal abscess. Recent admission for paraspinal abscess and the patient was treated with ertapenem for 4 weeks. Last dose 07/16/2015. The leukocytosis is no fever at present.  4. Right leg DVT, new onset A. fib. On Lovenox for therapeutic and decreased regulation. Continue metoprolol and Cardizem at present.  5. Stage IV metastatic squamous cell carcinoma of the lung. Follow-up with oncology as an outpatient. Patient was started on prednisone last admission for lip swelling and as per the H&P has been continued for lung cancer, patient started 20 mg prednisone 07/18/2015 with finish 5 day course and will be transitioned to 10 mg prednisone 07/23/2015  6. Chronic diastolic dysfunction. Currently does not appear to be volume overloaded. Continue close monitoring.  DVT Prophylaxis: on therapeutic anticoagulation. Nutrition: Cardiac diet Advance goals of care discussion: Full code  Brief Summary of Hospitalization:  HPI: As per the H and P dictated on admission, "[name] is a 82 y.o. female with PMH  of hypertension, chronic kidney disease stage III, stage IV non-small cell lung cancer with metastasis to the lumbar spine, and pressure sores who presents from her SNF with dysuria. Patient was discharged from this institution on 07/03/2015 following a protracted hospital course which involved treatment of sacral wound with polymicrobial infection and associated abscess, polymicrobial UTI, DVT in the right leg and jugular vein, and acute perioral swelling. Infectious disease consultation was obtained during the recent hospitalization and the patient was discharged to complete a 28 day course of IV ertapenem. Unfortunately, antimicrobial therapy at the SNF was delayed, and per review of SNF notes, she may have been treated with a seven-day course of IM ertapenem. Urinalysis from the SNF, obtained on 07/14/2015, is consistent with infection. The patient was not on antibiotics at that time, or at the time of admission. One of two cultures drawn from her Port-A-Cath during the last hospitalization was positive for Klebsiella, raising concern for seeding of the catheter. The plan at time of recent discharge was to repeat blood cultures following the prescribed antibiotic course, and if positive, pull the catheter.  In ED, patient was found to be afebrile, saturating well on room air, and with vital signs stable. CBC was obtained and notable for a stable normocytic anemia. BMP is notable for hyperkalemia with potassium of 6.2, and chronic renal insufficiency with serum creatinine of 2.02, up from an apparent baseline of 1.8. Patient was given Kayexalate by mouth and normal saline was infused at 125 cc/hr. patient remained hemodynamically stable in the emergency department and will be admitted to telemetry unit for ongoing evaluation and management of hyperkalemia, UTI, and concern for possible catheter-related bloodstream infection. " Daily update, Procedures:  none Consultants: none Antibiotics: Anti-infectives     Start     Dose/Rate Route Frequency Ordered Stop   07/18/15 1300  ertapenem (INVANZ) 1 g in sodium chloride 0.9 % 50 mL IVPB  Status:  Discontinued     1 g 100 mL/hr over 30 Minutes Intravenous Every 24 hours 07/18/15 1231 07/19/15 1002      Family Communication: no family was present at bedside, at the time of interview.   Disposition:  Expected discharge date: 07/20/2015 Barriers to safe discharge: Urine culture result   Intake/Output Summary (Last 24 hours) at 07/19/15 1336 Last data filed at 07/19/15 0900  Gross per 24 hour  Intake    720 ml  Output   2225 ml  Net  -1505 ml   Filed Weights   07/19/15 0634  Weight: 54.4 kg (119 lb 14.9 oz)    Objective: Physical Exam: Filed Vitals:   07/18/15 2206 07/19/15 0634 07/19/15 0831 07/19/15 0954  BP: 144/73 156/73 148/69 131/60  Pulse: 101 82 83 84  Temp: 97.8 F (36.6 C) 97.9 F (36.6 C) 97.6 F (36.4 C) 97.8 F (36.6 C)  TempSrc: Oral Oral Oral Oral  Resp: '20 16 16 16  '$ Height:      Weight:  54.4 kg (119 lb 14.9 oz)    SpO2: 96% 95% 97% 97%     General: Appear in mild distress, no Rash; Oral Mucosa moist. Cardiovascular: S1 and S2 Present, no Murmur, no JVD Respiratory: Bilateral Air entry present and Clear to Auscultation, no Crackles, no wheezes Abdomen: Bowel Sound present, Soft and no tenderness Extremities: no Pedal edema, no calf tenderness Neurology: Grossly no focal neuro deficit.  Data Reviewed: CBC:  Recent Labs Lab 07/17/15 1805 07/18/15 0320  WBC 8.1 6.8  NEUTROABS 7.5 5.5  HGB 8.5* 9.1*  HCT 26.2* 28.5*  MCV 88.8 91.9  PLT 208 914   Basic Metabolic Panel:  Recent Labs Lab 07/17/15 1805  07/18/15 0320  07/18/15 1200 07/18/15 1530 07/18/15 1930 07/18/15 2330 07/19/15 0310  NA 139  --  145  --   --   --   --   --   --   K 6.2*  < > 5.3*  < > 5.3* 5.4* 5.2* 4.6 4.4  CL 110  --  113*  --   --   --   --   --   --   CO2 22  --  22  --   --   --   --   --   --   GLUCOSE 137*  --   105*  --   --   --   --   --   --   BUN 74*  --  65*  --   --   --   --   --   --   CREATININE 2.02*  --  1.91*  --   --   --   --   --   --   CALCIUM 7.7*  --  8.8*  --   --   --   --   --   --   < > = values in this interval not displayed. Liver Function Tests: No results for input(s): AST, ALT, ALKPHOS, BILITOT, PROT, ALBUMIN in the last 168 hours. No results for input(s): LIPASE, AMYLASE in the last 168 hours. No results for input(s): AMMONIA in the last 168 hours.  Cardiac Enzymes: No results for input(s): CKTOTAL, CKMB, CKMBINDEX, TROPONINI in  the last 168 hours.  BNP (last 3 results) No results for input(s): BNP in the last 8760 hours.  CBG:  Recent Labs Lab 07/18/15 0806 07/19/15 0801  GLUCAP 75 75    Recent Results (from the past 240 hour(s))  Urine culture     Status: None (Preliminary result)   Collection Time: 07/17/15  5:53 PM  Result Value Ref Range Status   Specimen Description URINE, CATHETERIZED  Final   Special Requests NONE  Final   Culture   Final    >=100,000 COLONIES/mL GRAM NEGATIVE RODS Performed at [facility]    Report Status PENDING  Incomplete  Culture, blood (routine x 2)     Status: None (Preliminary result)   Collection Time: 07/17/15  6:00 PM  Result Value Ref Range Status   Specimen Description BLOOD LEFT ANTECUBITAL  Final   Special Requests BOTTLES DRAWN AEROBIC AND ANAEROBIC 5CC  Final   Culture   Final    NO GROWTH 1 DAY Performed at [facility]    Report Status PENDING  Incomplete  Culture, blood (routine x 2)     Status: None (Preliminary result)   Collection Time: 07/17/15 11:24 PM  Result Value Ref Range Status   Specimen Description BLOOD RIGHT ARM  Final   Special Requests BOTTLES DRAWN AEROBIC AND ANAEROBIC [place]  Final   Culture   Final    NO GROWTH 1 DAY Performed at [facility]    Report Status PENDING  Incomplete  MRSA PCR Screening     Status: None   Collection Time: 07/17/15 11:25 PM   Result Value Ref Range Status   MRSA by PCR NEGATIVE NEGATIVE Final    Comment:        The GeneXpert MRSA Assay (FDA approved for NASAL specimens only), is one component of a comprehensive MRSA colonization surveillance program. It is not intended to diagnose MRSA infection nor to guide or monitor treatment for MRSA infections.     Studies: No results found.   Scheduled Meds: . diltiazem  60 mg Oral BID  . enoxaparin  1 mg/kg Subcutaneous Q24H  . feeding supplement (ENSURE ENLIVE)  237 mL Oral BID BM  . feeding supplement (PRO-STAT SUGAR FREE 64)  30 mL Oral BID  . gabapentin  100 mg Oral QHS  . metoprolol tartrate  12.5 mg Oral BID  . pantoprazole  40 mg Oral Daily  . predniSONE  20 mg Oral Q breakfast  . sodium chloride flush  3 mL Intravenous Q12H   Continuous Infusions:  PRN Meds: acetaminophen, bisacodyl, diphenhydrAMINE, HYDROcodone-acetaminophen, ondansetron **OR** ondansetron (ZOFRAN) IV, senna-docusate, sodium chloride flush  Time spent: 30 minutes  Author: Berle Mull, MD [facility] Pager: [phone] 07/19/2015 1:36 PM  If 7PM-7AM, please contact night-coverage at [email or web address], password [facility]

## 2015-07-19 NOTE — Evaluation (Signed)
Physical Therapy Evaluation Patient Details Name: [name] MRN: [id number] DOB: [birth year] Today's Date: 07/19/2015   History of Present Illness   82 yo female adm with UTI  06/16/15- 07/03/15. DC'd to [facility], now re-admitted with UTI.  PMHx: stage IV NSCLC initially diagnosed in 2007, with disease recurrent in 11/2014, s/p XRT to right paraspinous mass with osseous invasion at L5, HTN , cad and  stent in 8366, chronic systolic CHF,  sacral ulcer and right  lateral mallelous wound .     Clinical Impression  Pt admitted with above diagnosis. Pt currently with functional limitations due to the deficits listed below (see PT Problem List). Max assist for supine to sit, mod assist sitting balance, max assist to pivot to recliner. CIR consult recommended.  Pt will benefit from skilled PT to increase their independence and safety with mobility to allow discharge to the venue listed below.       Follow Up Recommendations CIR;Supervision/Assistance - 24 hour    Equipment Recommendations  Wheelchair (measurements PT);Wheelchair cushion (measurements PT)    Recommendations for Other Services OT consult     Precautions / Restrictions Precautions Precautions: Fall Restrictions Weight Bearing Restrictions: No      Mobility  Bed Mobility Overal bed mobility: Needs Assistance Bed Mobility: Supine to Sit     Supine to sit: Max assist     General bed mobility comments: pt 50%, assist to raise trunk and advance BLEs  Transfers Overall transfer level: Needs assistance   Transfers: Sit to/from Stand;Stand Pivot Transfers Sit to Stand: +2 safety/equipment;Max assist Stand pivot transfers: Max assist;+2 safety/equipment       General transfer comment: attempted sit to stand x 2 with RW but BLEs buckled; performed SPT with max A (pt 40%)  Ambulation/Gait                Stairs            Wheelchair Mobility    Modified Rankin (Stroke Patients Only)        Balance Overall balance assessment: Needs assistance Sitting-balance support: Feet supported;Bilateral upper extremity supported Sitting balance-Leahy Scale: Poor Sitting balance - Comments: pt able to briefly hold neutral position, then has posterior lean requiring mod A to return to neutral; sat on EOB x 5 min Postural control: Posterior lean   Standing balance-Leahy Scale: Zero                               Pertinent Vitals/Pain Pain Assessment: 0-10 Pain Score: 5  Pain Location: R hip/buttock Pain Descriptors / Indicators: Sore Pain Intervention(s): Monitored during session;Limited activity within patient's tolerance;Repositioned (pt declined pain medicine)    Home Living Family/patient expects to be discharged to:: Private residence Living Arrangements: Other (Comment);Alone (son in law comes daily, pt also has HHA) Available Help at Discharge: Family;Friend(s);Available 24 hours/day;Personal care attendant (pt reports she has nursing care 24/7)   Home Access: Stairs to enter   Entrance Stairs-Number of Steps: 6 Home Layout: One level Home Equipment: Cane - single point;Walker - standard;Transport chair;Bedside commode;Hospital bed      Prior Function Level of Independence: Needs assistance   Gait / Transfers Assistance Needed: pt reports she assisted with stand pivot transfers but cannot do them independently prior to January hospitalization, at SNF she only got OOB once  ADL's / Homemaking Assistance Needed: Prior to January hospitalization, daughter gives her sponge baths in bed, changes diaper,  and gets her dressed. Neighbor and son-in-law assist with repositioning in bed.   Comments: Prior to SNF, pt had HHRN for wound care; pt reports she was getting HHPT working on LE strength and transfers (?)      Hand Dominance   Dominant Hand: Right    Extremity/Trunk Assessment   Upper Extremity Assessment: Defer to OT evaluation             RLE  Deficits / Details: ankle pf contracture, no active ankle DF, knee A/AROM to 0 to 60*, knee extension 2/5, hip -3/5; pitting edema R ankle/foot LLE Deficits / Details: AROM grossly WFL, generalized weakness, knee ext +3/5  Cervical / Trunk Assessment: Normal  Communication   Communication: No difficulties  Cognition Arousal/Alertness: Awake/alert Behavior During Therapy: WFL for tasks assessed/performed Overall Cognitive Status: Within Functional Limits for tasks assessed                      General Comments      Exercises General Exercises - Lower Extremity Ankle Circles/Pumps: AAROM;10 reps;Left Short Arc Quad: AAROM;Both;10 reps;Supine Heel Slides: AAROM;Both;10 reps;Supine      Assessment/Plan    PT Assessment Patient needs continued PT services  PT Diagnosis Generalized weakness;Acute pain   PT Problem List Decreased activity tolerance;Decreased mobility;Decreased strength;Pain;Decreased balance;Decreased range of motion;Decreased skin integrity  PT Treatment Interventions Functional mobility training;Therapeutic activities;Therapeutic exercise;Patient/family education;Balance training   PT Goals (Current goals can be found in the Care Plan section) Acute Rehab PT Goals Patient Stated Goal: to get stronger PT Goal Formulation: With patient Time For Goal Achievement: 08/02/15 Potential to Achieve Goals: Good    Frequency Min 3X/week   Barriers to discharge        Co-evaluation               End of Session Equipment Utilized During Treatment: Gait belt Activity Tolerance: Patient tolerated treatment well Patient left: in chair;with call bell/phone within reach Nurse Communication: Mobility status         Time: 1859-0931 PT Time Calculation (min) (ACUTE ONLY): 42 min   Charges:   PT Evaluation $PT Eval Moderate Complexity: 1 Procedure PT Treatments $Therapeutic Exercise: 8-22 mins $Therapeutic Activity: 8-22 mins   PT G Codes:         Philomena Doheny 07/19/2015, 9:57 AM [phone]

## 2015-07-20 DIAGNOSIS — N390 Urinary tract infection, site not specified: Secondary | ICD-10-CM | POA: Diagnosis present

## 2015-07-20 DIAGNOSIS — T83511A Infection and inflammatory reaction due to indwelling urethral catheter, initial encounter: Secondary | ICD-10-CM

## 2015-07-20 LAB — CBC WITH DIFFERENTIAL/PLATELET
Basophils Absolute: 0 10*3/uL (ref 0.0–0.1)
Basophils Relative: 0 %
EOS ABS: 0.1 10*3/uL (ref 0.0–0.7)
EOS PCT: 1 %
HCT: 26.7 % — ABNORMAL LOW (ref 36.0–46.0)
Hemoglobin: 8.2 g/dL — ABNORMAL LOW (ref 12.0–15.0)
LYMPHS ABS: 0.6 10*3/uL — AB (ref 0.7–4.0)
LYMPHS PCT: 11 %
MCH: 28.6 pg (ref 26.0–34.0)
MCHC: 30.7 g/dL (ref 30.0–36.0)
MCV: 93 fL (ref 78.0–100.0)
MONO ABS: 0.5 10*3/uL (ref 0.1–1.0)
MONOS PCT: 9 %
Neutro Abs: 4.2 10*3/uL (ref 1.7–7.7)
Neutrophils Relative %: 79 %
PLATELETS: 200 10*3/uL (ref 150–400)
RBC: 2.87 MIL/uL — AB (ref 3.87–5.11)
RDW: 17.6 % — ABNORMAL HIGH (ref 11.5–15.5)
WBC: 5.4 10*3/uL (ref 4.0–10.5)

## 2015-07-20 LAB — COMPREHENSIVE METABOLIC PANEL
ALK PHOS: 139 U/L — AB (ref 38–126)
ALT: 60 U/L — AB (ref 14–54)
ANION GAP: 10 (ref 5–15)
AST: 34 U/L (ref 15–41)
Albumin: 1.9 g/dL — ABNORMAL LOW (ref 3.5–5.0)
BUN: 68 mg/dL — ABNORMAL HIGH (ref 6–20)
CALCIUM: 7.5 mg/dL — AB (ref 8.9–10.3)
CHLORIDE: 114 mmol/L — AB (ref 101–111)
CO2: 20 mmol/L — ABNORMAL LOW (ref 22–32)
CREATININE: 1.73 mg/dL — AB (ref 0.44–1.00)
GFR, EST AFRICAN AMERICAN: 31 mL/min — AB (ref >60)
GFR, EST NON AFRICAN AMERICAN: 27 mL/min — AB (ref >60)
Glucose, Bld: 111 mg/dL — ABNORMAL HIGH (ref 65–99)
Potassium: 4 mmol/L (ref 3.5–5.1)
Sodium: 144 mmol/L (ref 135–145)
Total Bilirubin: 0.3 mg/dL (ref 0.3–1.2)
Total Protein: 4.7 g/dL — ABNORMAL LOW (ref 6.5–8.1)

## 2015-07-20 LAB — GLUCOSE, CAPILLARY: GLUCOSE-CAPILLARY: 85 mg/dL (ref 65–99)

## 2015-07-20 NOTE — Care Management Important Message (Signed)
Important Message  Patient Details  Name: TESSI EUSTACHE MRN: [id number] Date of Birth: 08-25-1933   Medicare Important Message Given:  Yes    Camillo Flaming 07/20/2015, 2:18 [place] Message  Patient Details  Name: EURIKA SANDY MRN: [id number] Date of Birth: Oct 11, 1933   Medicare Important Message Given:  Yes    Camillo Flaming 07/20/2015, 2:18 PM

## 2015-07-20 NOTE — Progress Notes (Signed)
[facility] Progress Note  Patient: [name] QPR:[id number]   PCP: Wende Neighbors, MD DOB: Feb 12, 1934   DOA: 07/17/2015   DOS: 07/20/2015   Date of Service: the patient was seen and examined on 07/20/2015  Subjective: No complaints of nausea vomiting and back pain and abdominal pain. She has increased fluid discharge from her chronic wound.  Nutrition: Tolerating oral diet Activity: Ambulating with the therapy Last BM: 07/20/2015  Assessment and Plan: 1. Catheter-associated urinary tract infection ([place])  Blood culture no growth for 2 day, MRSA PCR negative Urine culture grows ACINETOBACTER BAUMANNII Patient was initially started on ertapenem, although at present after discussion with the pharmacist the patient will be transitioned to fosfomycin. As per microbiology this organism is significantly resistant and therefore the patient is placed in contact isolation. Patient is clinically improving with above mentioned therapy, I briefly discussed with infectious disease who mentions that if the patient is clinically improving with the therapy would recommend to continue current therapy. Probably the current organism is a colonizer. Her Foley catheter has been changed today. Monitor the culture.  2. Acute on chronic kidney disease stage III. Dehydration. Hyperkalemia. Likely due to dehydration. Improving with IV hydration. Currently close to her baseline.  3. Paraspinal abscess. Recent admission for paraspinal abscess and the patient was treated with ertapenem for 4 weeks. Last dose 07/12/2015. Patient did not finish her expected 4 weeks of therapy although at present she does not have any back pain or tenderness and as per my discussion with infectious disease would continue to monitor her clinically.  4. Right leg DVT, new onset A. fib. On Lovenox for therapeutic anticoagulation Continue metoprolol and Cardizem at present.  5. Stage IV metastatic squamous cell carcinoma of the  lung. Follow-up with oncology as an outpatient. Patient was started on prednisone last admission for lip swelling and as per the H&P has been continued for lung cancer, patient started 20 mg prednisone 07/18/2015 with finish 5 day course and will be transitioned to 10 mg prednisone 07/23/2015  6. Chronic diastolic dysfunction. Currently does not appear to be volume overloaded. Continue close monitoring.  DVT Prophylaxis: on therapeutic anticoagulation. Nutrition: Cardiac diet Advance goals of care discussion: Full code  Brief Summary of Hospitalization:  HPI: As per the H and P dictated on admission, "[name] is a 82 y.o. female with PMH of hypertension, chronic kidney disease stage III, stage IV non-small cell lung cancer with metastasis to the lumbar spine, and pressure sores who presents from her SNF with dysuria. Patient was discharged from this institution on 07/03/2015 following a protracted hospital course which involved treatment of sacral wound with polymicrobial infection and associated abscess, polymicrobial UTI, DVT in the right leg and jugular vein, and acute perioral swelling. Infectious disease consultation was obtained during the recent hospitalization and the patient was discharged to complete a 28 day course of IV ertapenem. Unfortunately, antimicrobial therapy at the SNF was delayed, and per review of SNF notes, she may have been treated with a seven-day course of IM ertapenem. Urinalysis from the SNF, obtained on 07/14/2015, is consistent with infection. The patient was not on antibiotics at that time, or at the time of admission. One of two cultures drawn from her Port-A-Cath during the last hospitalization was positive for Klebsiella, raising concern for seeding of the catheter. The plan at time of recent discharge was to repeat blood cultures following the prescribed antibiotic course, and if positive, pull the catheter.  In ED, patient was  found to be afebrile,  saturating well on room air, and with vital signs stable. CBC was obtained and notable for a stable normocytic anemia. BMP is notable for hyperkalemia with potassium of 6.2, and chronic renal insufficiency with serum creatinine of 2.02, up from an apparent baseline of 1.8. Patient was given Kayexalate by mouth and normal saline was infused at 125 cc/hr. patient remained hemodynamically stable in the emergency department and will be admitted to telemetry unit for ongoing evaluation and management of hyperkalemia, UTI, and concern for possible catheter-related bloodstream infection. " Daily update, Procedures: none Consultants: none Antibiotics: Anti-infectives    Start     Dose/Rate Route Frequency Ordered Stop   07/18/15 1300  ertapenem (INVANZ) 1 g in sodium chloride 0.9 % 50 mL IVPB  Status:  Discontinued     1 g 100 mL/hr over 30 Minutes Intravenous Every 24 hours 07/18/15 1231 07/19/15 1002      Family Communication: no family was present at bedside, at the time of interview.   Disposition:  Expected discharge date: 07/21/2015 Barriers to safe discharge: Urine culture result   Intake/Output Summary (Last 24 hours) at 07/20/15 1857 Last data filed at 07/20/15 1821  Gross per 24 hour  Intake    720 ml  Output   1150 ml  Net   -430 ml   Filed Weights   07/19/15 0634  Weight: 54.4 kg (119 lb 14.9 oz)    Objective: Physical Exam: Filed Vitals:   07/19/15 1341 07/19/15 2035 07/20/15 0448 07/20/15 1358  BP: 132/60 141/75 155/72 137/62  Pulse: 78 78 76 68  Temp: 97.4 F (36.3 C) 98 F (36.7 C) 97.5 F (36.4 C) 97.5 F (36.4 C)  TempSrc: Oral Oral Oral Oral  Resp: '16 18 18 16  '$ Height:      Weight:      SpO2: 96% 96% 96% 97%     General: Appear in mild distress, no Rash; Oral Mucosa moist. Cardiovascular: S1 and S2 Present, no Murmur, no JVD Respiratory: Bilateral Air entry present and Clear to Auscultation, no Crackles, no wheezes Abdomen: Bowel Sound present, Soft and  no tenderness Extremities: no Pedal edema, no calf tenderness Neurology: Grossly no focal neuro deficit.  Data Reviewed: CBC:  Recent Labs Lab 07/17/15 1805 07/18/15 0320 07/20/15 0320  WBC 8.1 6.8 5.4  NEUTROABS 7.5 5.5 4.2  HGB 8.5* 9.1* 8.2*  HCT 26.2* 28.5* 26.7*  MCV 88.8 91.9 93.0  PLT 208 238 191   Basic Metabolic Panel:  Recent Labs Lab 07/17/15 1805  07/18/15 0320  07/18/15 1930 07/18/15 2330 07/19/15 0310 07/19/15 0900 07/20/15 0320  NA 139  --  145  --   --   --   --  144 144  K 6.2*  < > 5.3*  < > 5.2* 4.6 4.4 4.7 4.0  CL 110  --  113*  --   --   --   --  113* 114*  CO2 22  --  22  --   --   --   --  22 20*  GLUCOSE 137*  --  105*  --   --   --   --  132* 111*  BUN 74*  --  65*  --   --   --   --  64* 68*  CREATININE 2.02*  --  1.91*  --   --   --   --  1.88* 1.73*  CALCIUM 7.7*  --  8.8*  --   --   --   --  7.7* 7.5*  < > = values in this interval not displayed. Liver Function Tests:  Recent Labs Lab 07/19/15 0900 07/20/15 0320  AST 48* 34  ALT 75* 60*  ALKPHOS 158* 139*  BILITOT 0.6 0.3  PROT 5.1* 4.7*  ALBUMIN 2.0* 1.9*   No results for input(s): LIPASE, AMYLASE in the last 168 hours. No results for input(s): AMMONIA in the last 168 hours.  Cardiac Enzymes: No results for input(s): CKTOTAL, CKMB, CKMBINDEX, TROPONINI in the last 168 hours.  BNP (last 3 results) No results for input(s): BNP in the last 8760 hours.  CBG:  Recent Labs Lab 07/18/15 0806 07/19/15 0801 07/20/15 0810  GLUCAP 75 75 85    Recent Results (from the past 240 hour(s))  Urine culture     Status: None (Preliminary result)   Collection Time: 07/17/15  5:53 PM  Result Value Ref Range Status   Specimen Description URINE, CATHETERIZED  Final   Special Requests NONE  Final   Culture   Final    >=100,000 COLONIES/mL ACINETOBACTER BAUMANNII SUSCEPTIBILITIES TO FOLLOW Performed at [facility]    Report Status PENDING  Incomplete  Culture, blood  (routine x 2)     Status: None (Preliminary result)   Collection Time: 07/17/15  6:00 PM  Result Value Ref Range Status   Specimen Description BLOOD LEFT ANTECUBITAL  Final   Special Requests BOTTLES DRAWN AEROBIC AND ANAEROBIC 5CC  Final   Culture   Final    NO GROWTH 2 DAYS Performed at [facility]    Report Status PENDING  Incomplete  Culture, blood (routine x 2)     Status: None (Preliminary result)   Collection Time: 07/17/15 11:24 PM  Result Value Ref Range Status   Specimen Description BLOOD RIGHT ARM  Final   Special Requests BOTTLES DRAWN AEROBIC AND ANAEROBIC Tat Momoli  Final   Culture   Final    NO GROWTH 2 DAYS Performed at [facility]    Report Status PENDING  Incomplete  MRSA PCR Screening     Status: None   Collection Time: 07/17/15 11:25 PM  Result Value Ref Range Status   MRSA by PCR NEGATIVE NEGATIVE Final    Comment:        The GeneXpert MRSA Assay (FDA approved for NASAL specimens only), is one component of a comprehensive MRSA colonization surveillance program. It is not intended to diagnose MRSA infection nor to guide or monitor treatment for MRSA infections.     Studies: No results found.   Scheduled Meds: . diltiazem  60 mg Oral BID  . enoxaparin  1 mg/kg Subcutaneous Q24H  . feeding supplement (ENSURE ENLIVE)  237 mL Oral BID BM  . feeding supplement (PRO-STAT SUGAR FREE 64)  30 mL Oral BID  . gabapentin  100 mg Oral QHS  . metoprolol tartrate  12.5 mg Oral BID  . pantoprazole  40 mg Oral Daily  . predniSONE  20 mg Oral Q breakfast  . sodium chloride flush  3 mL Intravenous Q12H   Continuous Infusions:  PRN Meds: acetaminophen, bisacodyl, diphenhydrAMINE, HYDROcodone-acetaminophen, ondansetron **OR** ondansetron (ZOFRAN) IV, senna-docusate, sodium chloride flush  Time spent: 30 minutes  Author: Berle Mull, MD [facility] Pager: [phone] 07/20/2015 6:57 PM  If 7PM-7AM, please contact night-coverage at  [email or web address], password [email or web address]

## 2015-07-21 DIAGNOSIS — M4620 Osteomyelitis of vertebra, site unspecified: Secondary | ICD-10-CM

## 2015-07-21 LAB — COMPREHENSIVE METABOLIC PANEL
ALBUMIN: 2 g/dL — AB (ref 3.5–5.0)
ALT: 58 U/L — ABNORMAL HIGH (ref 14–54)
ANION GAP: 10 (ref 5–15)
AST: 37 U/L (ref 15–41)
Alkaline Phosphatase: 133 U/L — ABNORMAL HIGH (ref 38–126)
BILIRUBIN TOTAL: 0.3 mg/dL (ref 0.3–1.2)
BUN: 64 mg/dL — ABNORMAL HIGH (ref 6–20)
CO2: 22 mmol/L (ref 22–32)
Calcium: 7.8 mg/dL — ABNORMAL LOW (ref 8.9–10.3)
Chloride: 114 mmol/L — ABNORMAL HIGH (ref 101–111)
Creatinine, Ser: 1.93 mg/dL — ABNORMAL HIGH (ref 0.44–1.00)
GFR calc Af Amer: 27 mL/min — ABNORMAL LOW (ref >60)
GFR, EST NON AFRICAN AMERICAN: 24 mL/min — AB (ref >60)
GLUCOSE: 95 mg/dL (ref 65–99)
POTASSIUM: 4.3 mmol/L (ref 3.5–5.1)
Sodium: 146 mmol/L — ABNORMAL HIGH (ref 135–145)
TOTAL PROTEIN: 4.8 g/dL — AB (ref 6.5–8.1)

## 2015-07-21 LAB — URINE CULTURE

## 2015-07-21 LAB — BASIC METABOLIC PANEL
Anion gap: 10 (ref 5–15)
BUN: 64 mg/dL — ABNORMAL HIGH (ref 6–20)
CHLORIDE: 112 mmol/L — AB (ref 101–111)
CO2: 21 mmol/L — ABNORMAL LOW (ref 22–32)
Calcium: 7.8 mg/dL — ABNORMAL LOW (ref 8.9–10.3)
Creatinine, Ser: 2.04 mg/dL — ABNORMAL HIGH (ref 0.44–1.00)
GFR, EST AFRICAN AMERICAN: 26 mL/min — AB (ref >60)
GFR, EST NON AFRICAN AMERICAN: 22 mL/min — AB (ref >60)
Glucose, Bld: 132 mg/dL — ABNORMAL HIGH (ref 65–99)
POTASSIUM: 4.6 mmol/L (ref 3.5–5.1)
SODIUM: 143 mmol/L (ref 135–145)

## 2015-07-21 LAB — GLUCOSE, CAPILLARY: GLUCOSE-CAPILLARY: 111 mg/dL — AB (ref 65–99)

## 2015-07-21 MED ORDER — SODIUM CHLORIDE 0.9 % IV SOLN
INTRAVENOUS | Status: DC
  Administered 2015-07-21 – 2015-07-22 (×2): via INTRAVENOUS

## 2015-07-21 NOTE — Progress Notes (Signed)
[facility] Progress Note  Patient: [name] XLK:[id number]   PCP: Wende Neighbors, MD DOB: 1933-07-20   DOA: 07/17/2015   DOS: 07/21/2015   Date of Service: the patient was seen and examined on 07/21/2015  Subjective: No complaints of nausea vomiting and back pain and abdominal pain. She has increased fluid discharge from her chronic wound.  Nutrition: Tolerating oral diet Activity: Ambulating with the therapy Last BM: 07/20/2015  Assessment and Plan: 1. Catheter-associated urinary tract infection (HCC)  Blood culture no growth for 2 day, MRSA PCR negative Urine culture grows ACINETOBACTER BAUMANNII, multidrug resistant Patient was initially started on ertapenem, although at present after discussion with the pharmacist the patient will be transitioned to fosfomycin. Patient is clinically improving with above mentioned therapy, I briefly discussed with infectious disease who mentions that if the patient is clinically improving with the therapy would recommend to continue current therapy. Probably the current organism is a colonizer. Her Foley catheter has been changed 07/20/2015.  2. Acute on chronic kidney disease stage III. Dehydration. Hyperkalemia. Likely due to dehydration. Initially improved with IV hydration, after discontinuation of the IV hydration patient has mild increase in the serum creatinine. We will hydrate her overnight and monitor renal function. Recheck BMP in the morning Currently close to her baseline.  3. Paraspinal abscess. Recent admission for paraspinal abscess and the patient was treated with ertapenem for 4 weeks. Last dose 07/12/2015. Patient did not finish her expected 4 weeks of therapy although at present she does not have any back pain or tenderness and as per my discussion with infectious disease would continue to monitor her clinically.  4. Right leg DVT, new onset A. fib. On Lovenox for therapeutic anticoagulation Continue metoprolol and  Cardizem at present.  5. Stage IV metastatic squamous cell carcinoma of the lung. Follow-up with oncology as an outpatient. Patient was started on prednisone last admission for lip swelling and as per the H&P has been continued for lung cancer, patient started 20 mg prednisone 07/18/2015 with finish 5 day course and will be transitioned to 10 mg prednisone 07/23/2015  6. Chronic diastolic dysfunction. Currently does not appear to be volume overloaded. Continue close monitoring.  DVT Prophylaxis: on therapeutic anticoagulation. Nutrition: Cardiac diet Advance goals of care discussion: Full code  Brief Summary of Hospitalization:  HPI: As per the H and P dictated on admission, "[name] is a 82 y.o. female with PMH of hypertension, chronic kidney disease stage III, stage IV non-small cell lung cancer with metastasis to the lumbar spine, and pressure sores who presents from her SNF with dysuria. Patient was discharged from this institution on 07/03/2015 following a protracted hospital course which involved treatment of sacral wound with polymicrobial infection and associated abscess, polymicrobial UTI, DVT in the right leg and jugular vein, and acute perioral swelling. Infectious disease consultation was obtained during the recent hospitalization and the patient was discharged to complete a 28 day course of IV ertapenem. Unfortunately, antimicrobial therapy at the SNF was delayed, and per review of SNF notes, she may have been treated with a seven-day course of IM ertapenem. Urinalysis from the SNF, obtained on 07/14/2015, is consistent with infection. The patient was not on antibiotics at that time, or at the time of admission. One of two cultures drawn from her Port-A-Cath during the last hospitalization was positive for Klebsiella, raising concern for seeding of the catheter. The plan at time of recent discharge was to repeat blood cultures following the prescribed antibiotic course,  and if  positive, pull the catheter.  In ED, patient was found to be afebrile, saturating well on room air, and with vital signs stable. CBC was obtained and notable for a stable normocytic anemia. BMP is notable for hyperkalemia with potassium of 6.2, and chronic renal insufficiency with serum creatinine of 2.02, up from an apparent baseline of 1.8. Patient was given Kayexalate by mouth and normal saline was infused at 125 cc/hr. patient remained hemodynamically stable in the emergency department and will be admitted to telemetry unit for ongoing evaluation and management of hyperkalemia, UTI, and concern for possible catheter-related bloodstream infection." Daily update, Procedures: none Consultants: none Antibiotics: Anti-infectives    Start     Dose/Rate Route Frequency Ordered Stop   07/18/15 1300  ertapenem (INVANZ) 1 g in sodium chloride 0.9 % 50 mL IVPB  Status:  Discontinued     1 g 100 mL/hr over 30 Minutes Intravenous Every 24 hours 07/18/15 1231 07/19/15 1002     Family Communication: no family was present at bedside, at the time of interview.   Disposition:  Expected discharge date: 07/22/2015 Barriers to safe discharge: Improvement in renal function   Intake/Output Summary (Last 24 hours) at 07/21/15 1555 Last data filed at 07/21/15 1400  Gross per 24 hour  Intake    942 ml  Output   1200 ml  Net   -258 ml   Filed Weights   07/19/15 0634 07/21/15 0510  Weight: 54.4 kg (119 lb 14.9 oz) 55.6 kg (122 lb 9.2 oz)    Objective: Physical Exam: Filed Vitals:   07/20/15 2040 07/21/15 0510 07/21/15 0923 07/21/15 1335  BP: 168/75 144/64 149/69 142/64  Pulse: 80 73 78 70  Temp: 97.8 F (36.6 C) 97.5 F (36.4 C) 97.6 F (36.4 C) 97.7 F (36.5 C)  TempSrc: Oral Oral Oral Oral  Resp: '20 20 20 20  '$ Height:      Weight:  55.6 kg (122 lb 9.2 oz)    SpO2: 97% 96% 95% 96%    General: Appear in mild distress, no Rash; Oral Mucosa moist. Cardiovascular: S1 and S2 Present, no Murmur,  no JVD Respiratory: Bilateral Air entry present and Clear to Auscultation, no Crackles, no wheezes Abdomen: Bowel Sound present, Soft and no tenderness Extremities: no Pedal edema, no calf tenderness  Data Reviewed: CBC:  Recent Labs Lab 07/17/15 1805 07/18/15 0320 07/20/15 0320  WBC 8.1 6.8 5.4  NEUTROABS 7.5 5.5 4.2  HGB 8.5* 9.1* 8.2*  HCT 26.2* 28.5* 26.7*  MCV 88.8 91.9 93.0  PLT 208 238 127   Basic Metabolic Panel:  Recent Labs Lab 07/18/15 0320  07/19/15 0310 07/19/15 0900 07/20/15 0320 07/21/15 0234 07/21/15 1235  NA 145  --   --  144 144 146* 143  K 5.3*  < > 4.4 4.7 4.0 4.3 4.6  CL 113*  --   --  113* 114* 114* 112*  CO2 22  --   --  22 20* 22 21*  GLUCOSE 105*  --   --  132* 111* 95 132*  BUN 65*  --   --  64* 68* 64* 64*  CREATININE 1.91*  --   --  1.88* 1.73* 1.93* 2.04*  CALCIUM 8.8*  --   --  7.7* 7.5* 7.8* 7.8*  < > = values in this interval not displayed. Liver Function Tests:  Recent Labs Lab 07/19/15 0900 07/20/15 0320 07/21/15 0234  AST 48* 34 37  ALT 75* 60* 58*  ALKPHOS  158* 139* 133*  BILITOT 0.6 0.3 0.3  PROT 5.1* 4.7* 4.8*  ALBUMIN 2.0* 1.9* 2.0*   No results for input(s): LIPASE, AMYLASE in the last 168 hours. No results for input(s): AMMONIA in the last 168 hours.  Cardiac Enzymes: No results for input(s): CKTOTAL, CKMB, CKMBINDEX, TROPONINI in the last 168 hours.  BNP (last 3 results) No results for input(s): BNP in the last 8760 hours.  CBG:  Recent Labs Lab 07/18/15 0806 07/19/15 0801 07/20/15 0810 07/21/15 0710  GLUCAP 75 75 85 111*    Recent Results (from the past 240 hour(s))  Urine culture     Status: None   Collection Time: 07/17/15  5:53 PM  Result Value Ref Range Status   Specimen Description URINE, CATHETERIZED  Final   Special Requests NONE  Final   Culture   Final    >=100,000 COLONIES/mL ACINETOBACTER CALCOACETICUS/BAUMANNII COMPLEX MULTI-DRUG RESISTANT ORGANISM RESULT CALLED TO, READ BACK BY  AND VERIFIED WITH: DR Rudolpho Claxton 07/21/15 @ 1100 M VESTAL Performed at [facility]    Report Status 07/21/2015 FINAL  Final   Organism ID, Bacteria ACINETOBACTER CALCOACETICUS/BAUMANNII COMPLEX  Final      Susceptibility   Acinetobacter calcoaceticus/baumannii complex - MIC*    CEFTAZIDIME >=64 RESISTANT Resistant     CEFTRIAXONE >=64 RESISTANT Resistant     CIPROFLOXACIN >=4 RESISTANT Resistant     GENTAMICIN >=16 RESISTANT Resistant     IMIPENEM >=16 RESISTANT Resistant     PIP/TAZO >=128 RESISTANT Resistant     TRIMETH/SULFA >=320 RESISTANT Resistant     AMPICILLIN/SULBACTAM 16 INTERMEDIATE Intermediate     * >=100,000 COLONIES/mL ACINETOBACTER CALCOACETICUS/BAUMANNII COMPLEX  Culture, blood (routine x 2)     Status: None (Preliminary result)   Collection Time: 07/17/15  6:00 PM  Result Value Ref Range Status   Specimen Description BLOOD LEFT ANTECUBITAL  Final   Special Requests BOTTLES DRAWN AEROBIC AND ANAEROBIC 5CC  Final   Culture   Final    NO GROWTH 2 DAYS Performed at [facility]    Report Status PENDING  Incomplete  Culture, blood (routine x 2)     Status: None (Preliminary result)   Collection Time: 07/17/15 11:24 PM  Result Value Ref Range Status   Specimen Description BLOOD RIGHT ARM  Final   Special Requests BOTTLES DRAWN AEROBIC AND ANAEROBIC [place]  Final   Culture   Final    NO GROWTH 2 DAYS Performed at [facility]    Report Status PENDING  Incomplete  MRSA PCR Screening     Status: None   Collection Time: 07/17/15 11:25 PM  Result Value Ref Range Status   MRSA by PCR NEGATIVE NEGATIVE Final    Comment:        The GeneXpert MRSA Assay (FDA approved for NASAL specimens only), is one component of a comprehensive MRSA colonization surveillance program. It is not intended to diagnose MRSA infection nor to guide or monitor treatment for MRSA infections.     Studies: No results found.   Scheduled Meds: . diltiazem  60 mg Oral BID    . enoxaparin  1 mg/kg Subcutaneous Q24H  . feeding supplement (ENSURE ENLIVE)  237 mL Oral BID BM  . feeding supplement (PRO-STAT SUGAR FREE 64)  30 mL Oral BID  . gabapentin  100 mg Oral QHS  . metoprolol tartrate  12.5 mg Oral BID  . pantoprazole  40 mg Oral Daily  . predniSONE  20 mg Oral Q breakfast  .  sodium chloride flush  3 mL Intravenous Q12H   Continuous Infusions: . sodium chloride 100 mL/hr at 07/21/15 1548   PRN Meds: acetaminophen, bisacodyl, diphenhydrAMINE, HYDROcodone-acetaminophen, ondansetron **OR** ondansetron (ZOFRAN) IV, senna-docusate, sodium chloride flush  Time spent: 30 minutes  Author: Berle Mull, MD [facility] Pager: [phone] 07/21/2015 3:55 PM  If 7PM-7AM, please contact night-coverage at [email or web address], password [facility]

## 2015-07-21 NOTE — Care Management Note (Signed)
Case Management Note  Patient Details  Name: CABRIA MICALIZZI MRN: [id number] Date of Birth: Aug 12, 1933    Expected Discharge Date:   (unknown)               Expected Discharge Plan:  [place]  In-House Referral:     Discharge planning Services  CM Consult  Post Acute Care Choice:    Choice offered to:     DME Arranged:  Air overlay mattress DME Agency:  [place]:  RN, PT, Nurse's Aide [place] Agency:  [place]  Status of Service:  In process, will continue to follow  Medicare Important Message Given:  Yes Date Medicare IM Given:    Medicare IM give by:    Date Additional Medicare IM Given:    Additional Medicare Important Message give by:     If discussed at [place] of Stay Meetings, dates discussed:    Additional Comments: Pt continues to refuse SNF. She wants to continue with [facility] services from [place] when she returns home.  This CM called pt daughter [name] per pt's request. [name] is working on getting additional assistance for her mom, to stay with her during the day.  [name] is requesting an overlay mattress for home. Overlay mattress ordered and [facility] DME rep contacted.  No other CM needs communicated at this time.   Lynnell Catalan, RN 07/21/2015, 2:21 PM [phone]

## 2015-07-21 NOTE — Progress Notes (Signed)
CSW received consult for SNF placement. CSW spoke with patient & daughter, [name] via phone - they both are stating that they would prefer to take patient home with home health. Patient's son-in-law sits with her during the day and they have a nurse that comes to the house as well that daughter will try to increase the time for. RNCM, Alinda Sierras aware. Anticipating discharge tomorrow, Sat 2/18.   Patient will need non-emergency ambulance transport home. CSW confirmed home address with patient's daughter. CSW will arrange for [facility]AR once discharge order is in tomorrow.   Raynaldo Opitz, [facility] Clinical Social Worker cell #: [phone]

## 2015-07-21 NOTE — Progress Notes (Signed)
Physical Therapy Treatment Patient Details Name: [name] MRN: [id number] DOB: March 30, 1934 Today's Date: 07/21/2015    History of Present Illness  82 yo female adm with UTI  06/16/15- 07/03/15. DC'd to [facility], now re-admitted with UTI.  PMHx: stage IV NSCLC initially diagnosed in 2007, with disease recurrent in 11/2014, s/p XRT to right paraspinous mass with osseous invasion at L5, HTN , cad and  stent in 8099, chronic systolic CHF,  sacral ulcer and right  lateral mallelous wound .     PT Comments    Max assist for supine to sit, pt sat on EOB x 5 min then returned to supine due to needing clean up from incontinence of bowel. Pt fatigued after sitting 5 min.   Follow Up Recommendations  Supervision/Assistance - 24 hour;Home health PT     Equipment Recommendations  Wheelchair (measurements PT);Wheelchair cushion (measurements PT)    Recommendations for Other Services OT consult     Precautions / Restrictions Precautions Precautions: Fall Restrictions Weight Bearing Restrictions: No    Mobility  Bed Mobility Overal bed mobility: Needs Assistance Bed Mobility: Supine to Sit     Supine to sit: Max assist     General bed mobility comments: pt 50%, assist to raise trunk and advance BLEs; pt sat on EOB x 5 min, noted she was incontinent of bowel so returned pt to supine and notified nursing. Pt was fatigued after sitting on EOB.   Transfers                    Ambulation/Gait                 Stairs            Wheelchair Mobility    Modified Rankin (Stroke Patients Only)       Balance   Sitting-balance support: Feet supported;Bilateral upper extremity supported Sitting balance-Leahy Scale: Poor Sitting balance - Comments: improved sitting balance today, able to maintain neutral  with B feet and BUEs supported, sat on EOB x 5 min, then returned to supine for pericare     Standing balance-Leahy Scale: Zero                       Cognition Arousal/Alertness: Awake/alert Behavior During Therapy: WFL for tasks assessed/performed Overall Cognitive Status: Within Functional Limits for tasks assessed                      Exercises      General Comments        Pertinent Vitals/Pain Pain Assessment: No/denies pain    Home Living                      Prior Function            PT Goals (current goals can now be found in the care plan section) Acute Rehab PT Goals Patient Stated Goal: to get stronger Time For Goal Achievement: 08/02/15 Potential to Achieve Goals: Good Progress towards PT goals: Progressing toward goals    Frequency  Min 3X/week    PT Plan Current plan remains appropriate    Co-evaluation             End of Session Equipment Utilized During Treatment: Gait belt Activity Tolerance: Patient tolerated treatment well;No increased pain Patient left: with call bell/phone within reach;in bed     Time: 8338-2505 PT Time Calculation (min) (ACUTE ONLY):  18 min  Charges:  $Therapeutic Activity: 8-22 mins                    G Codes:      Philomena Doheny 07/21/2015, 1:16 PM [phone]

## 2015-07-22 DIAGNOSIS — L97519 Non-pressure chronic ulcer of other part of right foot with unspecified severity: Secondary | ICD-10-CM

## 2015-07-22 LAB — BASIC METABOLIC PANEL
Anion gap: 6 (ref 5–15)
BUN: 60 mg/dL — AB (ref 6–20)
CALCIUM: 7.7 mg/dL — AB (ref 8.9–10.3)
CO2: 22 mmol/L (ref 22–32)
CREATININE: 1.83 mg/dL — AB (ref 0.44–1.00)
Chloride: 116 mmol/L — ABNORMAL HIGH (ref 101–111)
GFR calc Af Amer: 29 mL/min — ABNORMAL LOW (ref >60)
GFR calc non Af Amer: 25 mL/min — ABNORMAL LOW (ref >60)
GLUCOSE: 90 mg/dL (ref 65–99)
Potassium: 4.7 mmol/L (ref 3.5–5.1)
Sodium: 144 mmol/L (ref 135–145)

## 2015-07-22 LAB — GLUCOSE, CAPILLARY: Glucose-Capillary: 73 mg/dL (ref 65–99)

## 2015-07-22 MED ORDER — DILTIAZEM HCL 60 MG PO TABS
60.0000 mg | ORAL_TABLET | Freq: Two times a day (BID) | ORAL | Status: DC
Start: 2015-07-22 — End: 2015-11-30

## 2015-07-22 MED ORDER — FOSFOMYCIN TROMETHAMINE 3 G PO PACK: 3.0000 g | PACK | Freq: Once | ORAL | Status: DC

## 2015-07-22 MED ORDER — HEPARIN SOD (PORK) LOCK FLUSH 100 UNIT/ML IV SOLN
500.0000 [IU] | Freq: Once | INTRAVENOUS | Status: AC
  Administered 2015-07-22: 500 [IU] via INTRAVENOUS
  Filled 2015-07-22: qty 5

## 2015-07-22 MED ORDER — FOSFOMYCIN TROMETHAMINE 3 G PO PACK
3.0000 g | PACK | Freq: Once | ORAL | Status: AC
  Administered 2015-07-22: 3 g via ORAL
  Filled 2015-07-22: qty 3

## 2015-07-22 MED ORDER — PRO-STAT SUGAR FREE PO LIQD: 30.0000 mL | Freq: Two times a day (BID) | ORAL | Status: DC

## 2015-07-22 MED ORDER — ENSURE ENLIVE PO LIQD: 237.0000 mL | Freq: Two times a day (BID) | ORAL | Status: DC

## 2015-07-22 MED ORDER — METOPROLOL TARTRATE 25 MG PO TABS
12.5000 mg | ORAL_TABLET | Freq: Two times a day (BID) | ORAL | Status: DC
Start: 2015-07-22 — End: 2015-12-07

## 2015-07-22 MED ORDER — PREDNISONE 10 MG PO TABS: 10.0000 mg | ORAL_TABLET | Freq: Every day | ORAL | Status: DC

## 2015-07-22 NOTE — Care Management Note (Addendum)
Case Management Note  Patient Details  Name: [name] MRN: [id number] Date of Birth: Dec 19, 1933  Subjective/Objective:      UTI, CKD              Action/Plan: NCM spoke to dtr, Vladimir Creeks, # [phone]. States pt has gel overlay mattress at home. States the mattress is hard and uncomfortable. And believes the mattress was making her wounds worse. States air mattress used at the hospital seems to be better and she is requesting a version of the air mattress for home. NCM contacted [facility] to follow up on mattess overlay. Notified attending MD and orders modified. Provided [facility] with dtr's contact number to follow up for delivery of DME. Pt is bed bound and will need mattress delivered prior to dc. Contacted [facility] to follow up on referral for [facility]. Pt scheduled for dc home today. Offered choice and dtr states pt had [facility] in the past for [facility].   Expected Discharge Date:  07/22/2015             Expected Discharge Plan:  [place]  In-House Referral:  NA  Discharge planning Services  CM Consult  Post Acute Care Choice:  Home Health Choice offered to:  Adult Children  DME Arranged:  [place] DME Agency:  [place] Arranged:  RN, PT, Nurse's Aide [place] Agency:  [place]  Status of Service:  Completed, signed off  Medicare Important Message Given:  Yes Date Medicare IM Given:    Medicare IM give by:    Date Additional Medicare IM Given:    Additional Medicare Important Message give by:     If discussed at [place] of Stay Meetings, dates discussed:    Additional Comments:  Erenest Rasher, RN 07/22/2015, 11:23 AM

## 2015-07-22 NOTE — Progress Notes (Signed)
Discharge teaching completed with pt. and daughter. Discharge instructions given and reviewed with pt. and daughter. Pt. to call Monday to schedule appointments. Pt. to have overlay mattress from [place] and RN, NT, PT,OT from [facility]. Answered all questions. Extra supplies given to daughter for multiple dressings in case needed. Pt. Family to pick up medications at pharmacy. Non emergent transport notified.

## 2015-07-22 NOTE — Progress Notes (Signed)
[facility] here for transport. Pt. Left via stretcher. No respiratory distress noted. Pt. Discharged to home with family.

## 2015-07-23 LAB — CULTURE, BLOOD (ROUTINE X 2)
CULTURE: NO GROWTH
Culture: NO GROWTH

## 2015-07-24 ENCOUNTER — Ambulatory Visit: Payer: Medicare Other | Admitting: Cardiovascular Disease

## 2015-07-24 DIAGNOSIS — C7951 Secondary malignant neoplasm of bone: Secondary | ICD-10-CM | POA: Diagnosis not present

## 2015-07-24 DIAGNOSIS — G8220 Paraplegia, unspecified: Secondary | ICD-10-CM | POA: Diagnosis not present

## 2015-07-24 DIAGNOSIS — L8990 Pressure ulcer of unspecified site, unspecified stage: Secondary | ICD-10-CM | POA: Diagnosis not present

## 2015-07-24 DIAGNOSIS — L89154 Pressure ulcer of sacral region, stage 4: Secondary | ICD-10-CM | POA: Diagnosis not present

## 2015-07-24 DIAGNOSIS — B964 Proteus (mirabilis) (morganii) as the cause of diseases classified elsewhere: Secondary | ICD-10-CM | POA: Diagnosis not present

## 2015-07-24 DIAGNOSIS — L89514 Pressure ulcer of right ankle, stage 4: Secondary | ICD-10-CM | POA: Diagnosis not present

## 2015-07-24 NOTE — Discharge Summary (Signed)
[facility] Discharge Summary   Patient: [name] WER:[id number]   PCP: Wende Neighbors, MD DOB: 1933/08/01   Date of admission: 07/17/2015   Date of discharge: 07/22/2015    Discharge Diagnoses:  Principal Problem:   Catheter-associated urinary tract infection ([place]) Active Problems:   Pressure ulcer   Lung cancer stage IV with Metastatic squamous cell carcinoma to bone (HCC)   Acute renal failure superimposed on stage 3 chronic kidney disease (HCC)   Anemia of chronic disease   Paraspinal abscess (HCC)   Right leg DVT (HCC)   Right foot ulcer (HCC)   Hyperkalemia   Dehydration   Atrial fibrillation (HCC)   Diastolic dysfunction with chronic heart failure ([place])  Recommendations for Outpatient Follow-up:  1. Please follow-up with PCP in one week   Follow-up Information    Follow up with Wende Neighbors, MD. Schedule an appointment as soon as possible for a visit in 1 week.   Specialty:  Internal Medicine   Contact information:   [place] [address] [phone]       Follow up with [facility].   Why:  Home Health RN, Physical Therapy, aide and Occupational Therapy   Contact information:   [place] [address] [phone]       Follow up with [facility].   Why:  overlay mattress   Contact information:   [address] [place] [address] [phone]       Follow up with Sherren Mocha, MD. Call in 1 week.   Specialty:  Cardiology   Why:  to make appointment    Contact information:   [address] [place] [address] [phone]       Follow up with Eilleen Kempf., MD. Call in 1 week.   Specialty:  Oncology   Why:  for follow up appointment   Contact information:   [place] [address] [phone]       Diet recommendation: Regular diet Activity: The patient is advised to gradually reintroduce usual activities.  Discharge  Condition: fair  History of present illness: As per the H and P dictated on admission, "[name] is a 82 y.o. female with PMH of hypertension, chronic kidney disease stage III, stage IV non-small cell lung cancer with metastasis to the lumbar spine, and pressure sores who presents from her SNF with dysuria. Patient was discharged from this institution on 07/03/2015 following a protracted hospital course which involved treatment of sacral wound with polymicrobial infection and associated abscess, polymicrobial UTI, DVT in the right leg and jugular vein, and acute perioral swelling. Infectious disease consultation was obtained during the recent hospitalization and the patient was discharged to complete a 28 day course of IV ertapenem. Unfortunately, antimicrobial therapy at the SNF was delayed, and per review of SNF notes, she may have been treated with a seven-day course of IM ertapenem. Urinalysis from the SNF, obtained on 07/14/2015, is consistent with infection. The patient was not on antibiotics at that time, or at the time of admission. One of two cultures drawn from her Port-A-Cath during the last hospitalization was positive for Klebsiella, raising concern for seeding of the catheter. The plan at time of recent discharge was to repeat blood cultures following the prescribed antibiotic course, and if positive, pull the catheter.  In ED, patient was found to be afebrile, saturating well on room air, and with vital  signs stable. CBC was obtained and notable for a stable normocytic anemia. BMP is notable for hyperkalemia with potassium of 6.2, and chronic renal insufficiency with serum creatinine of 2.02, up from an apparent baseline of 1.8. Patient was given Kayexalate by mouth and normal saline was infused at 125 cc/hr. patient remained hemodynamically stable in the emergency department and will be admitted to telemetry unit for ongoing evaluation and management of hyperkalemia, UTI, and concern for  possible catheter-related bloodstream infection."  Hospital Course:  Summary of her active problems in the hospital is as following. 1. Catheter-associated urinary tract infection (HCC)  Blood culture no growth, MRSA PCR negative Urine culture grows ACINETOBACTER BAUMANNII, multidrug resistant Patient was initially started on ertapenem empirically, although at present after discussion with the pharmacist the patient will be transitioned to fosfomycin. Patient is clinically improving with fosfomycin, and later on the culture returned with MDRO. I briefly discussed with infectious disease who mentions that if the patient is clinically improving with the therapy would recommend to continue current therapy. Probably the current organism is a colonizer. Her Foley catheter has been changed 07/20/2015.  2. Acute on chronic kidney disease stage III. Dehydration. Hyperkalemia. Likely due to dehydration. Initially improved with IV hydration, after discontinuation of the IV hydration patient has mild increase in the serum creatinine. renal function now close to baseline.  3. Paraspinal abscess. Recent admission for paraspinal abscess and the patient was treated with ertapenem for 4 weeks. Last dose 07/12/2015. Patient did not finish her expected 4 weeks of therapy although at present she does not have any back pain or tenderness and as per my discussion with infectious disease would continue to monitor her clinically. Continue Foley catheter for healing decubitus ulcers.  4. Right leg DVT, new onset A. fib. On Lovenox for therapeutic anticoagulation Continue metoprolol and Cardizem at present.  5. Stage IV metastatic squamous cell carcinoma of the lung. Follow-up with oncology as an outpatient. Patient was started on prednisone last admission for lip swelling and as per the H&P has been continued for lung cancer, patient started 20 mg prednisone 07/18/2015 with finish 5 day course and will be  transitioned to 10 mg pron discharge  6. Chronic diastolic dysfunction. Currently does not appear to be volume overloaded. Continue close monitoring.  All other chronic medical condition were stable during the hospitalization.  Patient was seen by physical therapy, who recommended SNF,  although the patient and the family has refused to go to SNF and would like to go home with home health which was arranged by social worker and case Freight forwarder. On the day of the discharge the patient's vitals remained stable, and no other acute medical condition were reported by patient. the patient was felt safe to be discharge at home with family.  Procedures and Results:  Foley catheter change 07/20/2015   Consultations:  Phone consultation with infectious disease   DISCHARGE MEDICATION: Discharge Medication List as of 07/22/2015 11:38 AM    START taking these medications   Details  feeding supplement, ENSURE ENLIVE, (ENSURE ENLIVE) LIQD Take 237 mLs by mouth 2 (two) times daily between meals., Starting 07/22/2015, Until Discontinued, Normal    fosfomycin (MONUROL) 3 g PACK Take 3 g by mouth once., Starting 07/25/2015, Normal      CONTINUE these medications which have CHANGED   Details  Amino Acids-Protein Hydrolys (FEEDING SUPPLEMENT, PRO-STAT SUGAR FREE 64,) LIQD Take 30 mLs by mouth 2 (two) times daily., Starting 07/22/2015, Until Discontinued, Normal  diltiazem (CARDIZEM) 60 MG tablet Take 1 tablet (60 mg total) by mouth 2 (two) times daily., Starting 07/22/2015, Until Discontinued, Normal    metoprolol tartrate (LOPRESSOR) 25 MG tablet Take 0.5 tablets (12.5 mg total) by mouth 2 (two) times daily., Starting 07/22/2015, Until Discontinued, Normal    predniSONE (DELTASONE) 10 MG tablet Take 1 tablet (10 mg total) by mouth daily with breakfast., Starting 07/23/2015, Until Discontinued, Normal      CONTINUE these medications which have NOT CHANGED   Details  acetaminophen (TYLENOL) 500 MG tablet  Take 500 mg by mouth daily as needed for mild pain., Until Discontinued, Historical Med    diphenhydrAMINE (BENADRYL) 25 mg capsule Take 1 capsule (25 mg total) by mouth every 6 (six) hours as needed., Starting 07/03/2015, Until Discontinued, Normal    enoxaparin (LOVENOX) 60 MG/0.6ML injection Inject 0.6 mLs (60 mg total) into the skin daily., Starting 07/02/2015, Until Discontinued, Normal    famotidine (PEPCID) 20 MG tablet Take 1 tablet (20 mg total) by mouth daily., Starting 07/03/2015, Until Discontinued, Normal    furosemide (LASIX) 20 MG tablet Take 1 tablet (20 mg total) by mouth daily., Starting 07/02/2015, Until Discontinued, Normal    gabapentin (NEURONTIN) 100 MG capsule Take 1 capsule (100 mg total) by mouth at bedtime., Starting 07/02/2015, Until Discontinued, Normal    lidocaine-prilocaine (EMLA) cream Apply 1 application topically as needed., Starting 03/17/2015, Until Discontinued, Normal    loperamide (IMODIUM) 2 MG capsule Take 1 capsule (2 mg total) by mouth every 6 (six) hours as needed for diarrhea or loose stools., Starting 07/02/2015, Until Discontinued, Normal    SANTYL ointment Apply 1 application topically at bedtime as needed. For bed sores, Starting 12/27/2014, Until Discontinued, Historical Med    pantoprazole (PROTONIX) 40 MG tablet TAKE ONE TABLET BY MOUTH ONCE DAILY., Normal      STOP taking these medications     potassium chloride SA (K-DUR,KLOR-CON) 20 MEQ tablet      diphenoxylate-atropine (LOMOTIL) 2.5-0.025 MG per tablet      ertapenem 0.5 g in sodium chloride 0.9 % 50 mL      sodium bicarbonate 650 MG tablet        Allergies  Allergen Reactions  . Amlodipine Swelling  . Ciprofloxacin Hives and Other (See Comments)    REACTION: weakness  . Mirtazapine Other (See Comments)    Hallucinations and nightmares, verbally aggressive   . Statins Other (See Comments)  . Aspirin Swelling    Mouth swelling, tongue Patient reports that she tolerates  ibuprofen without problem  . Cephalexin Hives  . Hydralazine Nausea And Vomiting  . Iron Nausea And Vomiting  . Ambien [Zolpidem Tartrate] Other (See Comments)    confusion  . Lorazepam Other (See Comments)    Hallucinations, verbally aggressive  . Sulfonamide Derivatives Hives  . Zolpidem     Confusion  . Penicillins Other (See Comments)    Has patient had a PCN reaction causing immediate rash, facial/tongue/throat swelling, SOB or lightheadedness with hypotension: Yes Has patient had a PCN reaction causing severe rash involving mucus membranes or skin necrosis: Yes Has patient had a PCN reaction that required hospitalization No Has patient had a PCN reaction occurring within the last 10 years: No If all of the above answers are "NO", then may proceed with Cephalosporin use.   . Shellfish Allergy Hives and Rash  . Tape Rash   Discharge Instructions    Diet general    Complete by:  As directed  Discharge instructions    Complete by:  As directed   It is important that you read following instructions as well as go over your medication list with RN to help you understand your care after this hospitalization.  Discharge Instructions: Please follow-up with PCP in one week  Please request your primary care physician to go over all Hospital Tests and Procedure/Radiological results at the follow up,  Please get all Hospital records sent to your PCP by signing hospital release before you go home.   You were cared for by a hospitalist during your hospital stay. If you have any questions about your discharge medications or the care you received while you were in the hospital after you are discharged, you can call the unit and ask to speak with the hospitalist on call if the hospitalist that took care of you is not available.  Once you are discharged, your primary care physician will handle any further medical issues. Please note that NO REFILLS for any discharge medications will be  authorized once you are discharged, as it is imperative that you return to your primary care physician (or establish a relationship with a primary care physician if you do not have one) for your aftercare needs so that they can reassess your need for medications and monitor your lab values. You Must read complete instructions/literature along with all the possible adverse reactions/side effects for all the Medicines you take and that have been prescribed to you. Take any new Medicines after you have completely understood and accept all the possible adverse reactions/side effects. Wear Seat belts while driving.     Discharge wound care:    Complete by:  As directed   daily wound care to the sacrum, twice weekly care to the sacrum and right hip.Prevalon boots are provided to prevent pressure injury and a mattress with low air loss feature as she has two affected turning surfaces.     Increase activity slowly    Complete by:  As directed           Discharge Exam: Filed Weights   07/19/15 0634 07/21/15 0510  Weight: 54.4 kg (119 lb 14.9 oz) 55.6 kg (122 lb 9.2 oz)   Filed Vitals:   07/21/15 2130 07/22/15 1106  BP: 155/74 165/77  Pulse: 82 79  Temp: 98.2 F (36.8 C)   Resp: 19    General: Appear in mild distress, no Rash; Oral Mucosa moist. Cardiovascular: S1 and S2 Present, no Murmur, no JVD Respiratory: Bilateral Air entry present and Clear to Auscultation, no Crackles, no wheezes Abdomen: Bowel Sound present, Soft and no tenderness Extremities: no Pedal edema, no calf tenderness Neurology: Grossly no focal neuro deficit.  The results of significant diagnostics from this hospitalization (including imaging, microbiology, ancillary and laboratory) are listed below for reference.    Significant Diagnostic Studies: Dg Wrist Complete Right  06/27/2015  CLINICAL DATA:  Swelling and tenderness of the right hand and wrist. EXAM: RIGHT WRIST - COMPLETE 3+ VIEW COMPARISON:  None. FINDINGS:  There is no evidence of fracture or dislocation. There are osteoarthritic changes of the radiocarpal and first carpometacarpal joints with joint space narrowing, subchondral sclerosis and osteophyte formation. No evidence of osteolytic changes. Osseous mineralization is normal. There is diffuse soft tissue swelling. IMPRESSION: No evidence of fracture or osteomyelitis radiographically. Osteoarthritic changes of the radiocarpal and first carpometacarpal joints. Diffuse soft tissue swelling. Electronically Signed   By: Fidela Salisbury M.D.   On: 06/27/2015 12:31   Dg Hand  Complete Right  06/27/2015  CLINICAL DATA:  Pain and swelling of the right hand and wrist for about 2 weeks. EXAM: RIGHT HAND - COMPLETE 3+ VIEW COMPARISON:  03/17/2014 FINDINGS: There is no evidence of fracture or dislocation. Again seen osteoarthritic changes with narrowing of the radiocarpal joint space and first carpometacarpal joint, with subchondral sclerosis and osteophyte formation. The findings are chronic. No evidence of cortical destruction to suggest osteomyelitis. There is diffuse soft tissue swelling. IMPRESSION: No radiographic evidence fracture or osteomyelitis. Osteoarthritic changes of the radiocarpal and first carpometacarpal joints. Electronically Signed   By: Fidela Salisbury M.D.   On: 06/27/2015 12:28    Microbiology: Recent Results (from the past 240 hour(s))  Urine culture     Status: None   Collection Time: 07/17/15  5:53 PM  Result Value Ref Range Status   Specimen Description URINE, CATHETERIZED  Final   Special Requests NONE  Final   Culture   Final    >=100,000 COLONIES/mL ACINETOBACTER CALCOACETICUS/BAUMANNII COMPLEX MULTI-DRUG RESISTANT ORGANISM RESULT CALLED TO, READ BACK BY AND VERIFIED WITH: DR Rheana Casebolt 07/21/15 @ 1100 M VESTAL Performed at [facility]    Report Status 07/21/2015 FINAL  Final   Organism ID, Bacteria ACINETOBACTER CALCOACETICUS/BAUMANNII COMPLEX  Final       Susceptibility   Acinetobacter calcoaceticus/baumannii complex - MIC*    CEFTAZIDIME >=64 RESISTANT Resistant     CEFTRIAXONE >=64 RESISTANT Resistant     CIPROFLOXACIN >=4 RESISTANT Resistant     GENTAMICIN >=16 RESISTANT Resistant     IMIPENEM >=16 RESISTANT Resistant     PIP/TAZO >=128 RESISTANT Resistant     TRIMETH/SULFA >=320 RESISTANT Resistant     AMPICILLIN/SULBACTAM 16 INTERMEDIATE Intermediate     * >=100,000 COLONIES/mL ACINETOBACTER CALCOACETICUS/BAUMANNII COMPLEX  Culture, blood (routine x 2)     Status: None   Collection Time: 07/17/15  6:00 PM  Result Value Ref Range Status   Specimen Description BLOOD LEFT ANTECUBITAL  Final   Special Requests BOTTLES DRAWN AEROBIC AND ANAEROBIC 5CC  Final   Culture   Final    NO GROWTH 5 DAYS Performed at [facility]    Report Status 07/23/2015 FINAL  Final  Culture, blood (routine x 2)     Status: None   Collection Time: 07/17/15 11:24 PM  Result Value Ref Range Status   Specimen Description BLOOD RIGHT ARM  Final   Special Requests BOTTLES DRAWN AEROBIC AND ANAEROBIC [place]  Final   Culture   Final    NO GROWTH 5 DAYS Performed at [facility]    Report Status 07/23/2015 FINAL  Final  MRSA PCR Screening     Status: None   Collection Time: 07/17/15 11:25 PM  Result Value Ref Range Status   MRSA by PCR NEGATIVE NEGATIVE Final    Comment:        The GeneXpert MRSA Assay (FDA approved for NASAL specimens only), is one component of a comprehensive MRSA colonization surveillance program. It is not intended to diagnose MRSA infection nor to guide or monitor treatment for MRSA infections.      Labs: CBC:  Recent Labs Lab 07/17/15 1805 07/18/15 0320 07/20/15 0320  WBC 8.1 6.8 5.4  NEUTROABS 7.5 5.5 4.2  HGB 8.5* 9.1* 8.2*  HCT 26.2* 28.5* 26.7*  MCV 88.8 91.9 93.0  PLT 208 238 789   Basic Metabolic Panel:  Recent Labs Lab 07/19/15 0900 07/20/15 0320 07/21/15 0234 07/21/15 1235 07/22/15 0352   NA 144 144 146*  143 144  K 4.7 4.0 4.3 4.6 4.7  CL 113* 114* 114* 112* 116*  CO2 22 20* 22 21* 22  GLUCOSE 132* 111* 95 132* 90  BUN 64* 68* 64* 64* 60*  CREATININE 1.88* 1.73* 1.93* 2.04* 1.83*  CALCIUM 7.7* 7.5* 7.8* 7.8* 7.7*   Liver Function Tests:  Recent Labs Lab 07/19/15 0900 07/20/15 0320 07/21/15 0234  AST 48* 34 37  ALT 75* 60* 58*  ALKPHOS 158* 139* 133*  BILITOT 0.6 0.3 0.3  PROT 5.1* 4.7* 4.8*  ALBUMIN 2.0* 1.9* 2.0*   No results for input(s): LIPASE, AMYLASE in the last 168 hours. No results for input(s): AMMONIA in the last 168 hours. Cardiac Enzymes: No results for input(s): CKTOTAL, CKMB, CKMBINDEX, TROPONINI in the last 168 hours. BNP (last 3 results) No results for input(s): BNP in the last 8760 hours. CBG:  Recent Labs Lab 07/18/15 0806 07/19/15 0801 07/20/15 0810 07/21/15 0710 07/22/15 0749  GLUCAP 75 75 85 111* 73   Time spent: 30 minutes  Signed:  Alayia Meggison  [facility] 07/22/2015, 8:22 AM

## 2015-07-25 ENCOUNTER — Emergency Department (HOSPITAL_COMMUNITY): Payer: Medicare Other

## 2015-07-25 ENCOUNTER — Encounter (HOSPITAL_COMMUNITY): Payer: Self-pay | Admitting: Family Medicine

## 2015-07-25 ENCOUNTER — Inpatient Hospital Stay (HOSPITAL_COMMUNITY)
Admission: EM | Admit: 2015-07-25 | Discharge: 2015-07-27 | DRG: 291 | Disposition: A | Discharge status: Skilled Nursing Facility | Payer: Medicare Other | Attending: Internal Medicine | Admitting: Internal Medicine

## 2015-07-25 DIAGNOSIS — I509 Heart failure, unspecified: Secondary | ICD-10-CM | POA: Diagnosis not present

## 2015-07-25 DIAGNOSIS — C3480 Malignant neoplasm of overlapping sites of unspecified bronchus and lung: Secondary | ICD-10-CM | POA: Diagnosis not present

## 2015-07-25 DIAGNOSIS — L89814 Pressure ulcer of head, stage 4: Secondary | ICD-10-CM | POA: Diagnosis not present

## 2015-07-25 DIAGNOSIS — E876 Hypokalemia: Secondary | ICD-10-CM | POA: Diagnosis not present

## 2015-07-25 DIAGNOSIS — E440 Moderate protein-calorie malnutrition: Secondary | ICD-10-CM | POA: Diagnosis present

## 2015-07-25 DIAGNOSIS — N179 Acute kidney failure, unspecified: Secondary | ICD-10-CM | POA: Diagnosis present

## 2015-07-25 DIAGNOSIS — L89513 Pressure ulcer of right ankle, stage 3: Secondary | ICD-10-CM | POA: Diagnosis not present

## 2015-07-25 DIAGNOSIS — I482 Chronic atrial fibrillation: Secondary | ICD-10-CM

## 2015-07-25 DIAGNOSIS — I82401 Acute embolism and thrombosis of unspecified deep veins of right lower extremity: Secondary | ICD-10-CM | POA: Diagnosis present

## 2015-07-25 DIAGNOSIS — D649 Anemia, unspecified: Secondary | ICD-10-CM | POA: Diagnosis not present

## 2015-07-25 DIAGNOSIS — Z6821 Body mass index (BMI) 21.0-21.9, adult: Secondary | ICD-10-CM | POA: Diagnosis not present

## 2015-07-25 DIAGNOSIS — I130 Hypertensive heart and chronic kidney disease with heart failure and stage 1 through stage 4 chronic kidney disease, or unspecified chronic kidney disease: Secondary | ICD-10-CM | POA: Diagnosis present

## 2015-07-25 DIAGNOSIS — D638 Anemia in other chronic diseases classified elsewhere: Secondary | ICD-10-CM | POA: Diagnosis not present

## 2015-07-25 DIAGNOSIS — Z87891 Personal history of nicotine dependence: Secondary | ICD-10-CM

## 2015-07-25 DIAGNOSIS — I5033 Acute on chronic diastolic (congestive) heart failure: Principal | ICD-10-CM

## 2015-07-25 DIAGNOSIS — L89213 Pressure ulcer of right hip, stage 3: Secondary | ICD-10-CM | POA: Diagnosis not present

## 2015-07-25 DIAGNOSIS — I2510 Atherosclerotic heart disease of native coronary artery without angina pectoris: Secondary | ICD-10-CM | POA: Diagnosis not present

## 2015-07-25 DIAGNOSIS — T8851XA Hypothermia following anesthesia, initial encounter: Secondary | ICD-10-CM | POA: Diagnosis not present

## 2015-07-25 DIAGNOSIS — C3490 Malignant neoplasm of unspecified part of unspecified bronchus or lung: Secondary | ICD-10-CM | POA: Diagnosis not present

## 2015-07-25 DIAGNOSIS — A4189 Other specified sepsis: Secondary | ICD-10-CM | POA: Diagnosis not present

## 2015-07-25 DIAGNOSIS — R0602 Shortness of breath: Secondary | ICD-10-CM | POA: Diagnosis not present

## 2015-07-25 DIAGNOSIS — Z8249 Family history of ischemic heart disease and other diseases of the circulatory system: Secondary | ICD-10-CM

## 2015-07-25 DIAGNOSIS — Z86718 Personal history of other venous thrombosis and embolism: Secondary | ICD-10-CM | POA: Diagnosis not present

## 2015-07-25 DIAGNOSIS — R278 Other lack of coordination: Secondary | ICD-10-CM | POA: Diagnosis not present

## 2015-07-25 DIAGNOSIS — I4891 Unspecified atrial fibrillation: Secondary | ICD-10-CM | POA: Diagnosis not present

## 2015-07-25 DIAGNOSIS — L89154 Pressure ulcer of sacral region, stage 4: Secondary | ICD-10-CM | POA: Diagnosis present

## 2015-07-25 DIAGNOSIS — R1319 Other dysphagia: Secondary | ICD-10-CM | POA: Diagnosis not present

## 2015-07-25 DIAGNOSIS — N390 Urinary tract infection, site not specified: Secondary | ICD-10-CM | POA: Diagnosis not present

## 2015-07-25 DIAGNOSIS — I959 Hypotension, unspecified: Secondary | ICD-10-CM | POA: Diagnosis not present

## 2015-07-25 DIAGNOSIS — M6281 Muscle weakness (generalized): Secondary | ICD-10-CM | POA: Diagnosis not present

## 2015-07-25 DIAGNOSIS — R609 Edema, unspecified: Secondary | ICD-10-CM | POA: Diagnosis not present

## 2015-07-25 DIAGNOSIS — K219 Gastro-esophageal reflux disease without esophagitis: Secondary | ICD-10-CM | POA: Diagnosis present

## 2015-07-25 DIAGNOSIS — J9620 Acute and chronic respiratory failure, unspecified whether with hypoxia or hypercapnia: Secondary | ICD-10-CM | POA: Diagnosis not present

## 2015-07-25 DIAGNOSIS — N183 Chronic kidney disease, stage 3 unspecified: Secondary | ICD-10-CM | POA: Diagnosis present

## 2015-07-25 DIAGNOSIS — G8310 Monoplegia of lower limb affecting unspecified side: Secondary | ICD-10-CM | POA: Diagnosis not present

## 2015-07-25 DIAGNOSIS — L89813 Pressure ulcer of head, stage 3: Secondary | ICD-10-CM | POA: Diagnosis not present

## 2015-07-25 DIAGNOSIS — G061 Intraspinal abscess and granuloma: Secondary | ICD-10-CM | POA: Diagnosis not present

## 2015-07-25 DIAGNOSIS — C7951 Secondary malignant neoplasm of bone: Secondary | ICD-10-CM | POA: Diagnosis present

## 2015-07-25 DIAGNOSIS — R279 Unspecified lack of coordination: Secondary | ICD-10-CM | POA: Diagnosis not present

## 2015-07-25 DIAGNOSIS — I82409 Acute embolism and thrombosis of unspecified deep veins of unspecified lower extremity: Secondary | ICD-10-CM | POA: Diagnosis not present

## 2015-07-25 DIAGNOSIS — D630 Anemia in neoplastic disease: Secondary | ICD-10-CM | POA: Diagnosis not present

## 2015-07-25 DIAGNOSIS — Z801 Family history of malignant neoplasm of trachea, bronchus and lung: Secondary | ICD-10-CM

## 2015-07-25 HISTORY — DX: Unspecified atrial fibrillation: I48.91

## 2015-07-25 HISTORY — DX: Chronic diastolic (congestive) heart failure: I50.32

## 2015-07-25 LAB — COMPREHENSIVE METABOLIC PANEL
ALBUMIN: 2.4 g/dL — AB (ref 3.5–5.0)
ALT: 52 U/L (ref 14–54)
ANION GAP: 8 (ref 5–15)
AST: 42 U/L — ABNORMAL HIGH (ref 15–41)
Alkaline Phosphatase: 151 U/L — ABNORMAL HIGH (ref 38–126)
BILIRUBIN TOTAL: 0.4 mg/dL (ref 0.3–1.2)
BUN: 46 mg/dL — ABNORMAL HIGH (ref 6–20)
CALCIUM: 8 mg/dL — AB (ref 8.9–10.3)
CO2: 21 mmol/L — ABNORMAL LOW (ref 22–32)
Chloride: 116 mmol/L — ABNORMAL HIGH (ref 101–111)
Creatinine, Ser: 1.87 mg/dL — ABNORMAL HIGH (ref 0.44–1.00)
GFR calc non Af Amer: 24 mL/min — ABNORMAL LOW (ref >60)
GFR, EST AFRICAN AMERICAN: 28 mL/min — AB (ref >60)
GLUCOSE: 134 mg/dL — AB (ref 65–99)
POTASSIUM: 4.7 mmol/L (ref 3.5–5.1)
SODIUM: 145 mmol/L (ref 135–145)
TOTAL PROTEIN: 5.3 g/dL — AB (ref 6.5–8.1)

## 2015-07-25 LAB — CBC WITH DIFFERENTIAL/PLATELET
BASOS ABS: 0 10*3/uL (ref 0.0–0.1)
BASOS PCT: 0 %
Eosinophils Absolute: 0 10*3/uL (ref 0.0–0.7)
Eosinophils Relative: 0 %
HEMATOCRIT: 31.2 % — AB (ref 36.0–46.0)
HEMOGLOBIN: 9.7 g/dL — AB (ref 12.0–15.0)
LYMPHS PCT: 4 %
Lymphs Abs: 0.2 10*3/uL — ABNORMAL LOW (ref 0.7–4.0)
MCH: 29.3 pg (ref 26.0–34.0)
MCHC: 31.1 g/dL (ref 30.0–36.0)
MCV: 94.3 fL (ref 78.0–100.0)
MONO ABS: 0.2 10*3/uL (ref 0.1–1.0)
Monocytes Relative: 4 %
NEUTROS ABS: 5 10*3/uL (ref 1.7–7.7)
NEUTROS PCT: 92 %
Platelets: 186 10*3/uL (ref 150–400)
RBC: 3.31 MIL/uL — AB (ref 3.87–5.11)
RDW: 17.9 % — ABNORMAL HIGH (ref 11.5–15.5)
WBC: 5.4 10*3/uL (ref 4.0–10.5)

## 2015-07-25 LAB — BLOOD GAS, VENOUS
Acid-base deficit: 6.1 mmol/L — ABNORMAL HIGH (ref 0.0–2.0)
Bicarbonate: 20.5 mEq/L (ref 20.0–24.0)
DRAWN BY: 446111
O2 Saturation: 93.1 %
PATIENT TEMPERATURE: 98.6
PCO2 VEN: 48.5 mmHg (ref 45.0–50.0)
PH VEN: 7.248 — AB (ref 7.250–7.300)
TCO2: 19.7 mmol/L (ref 0–100)
pO2, Ven: 75.4 mmHg — ABNORMAL HIGH (ref 30.0–45.0)

## 2015-07-25 LAB — I-STAT TROPONIN, ED: Troponin i, poc: 0.04 ng/mL (ref 0.00–0.08)

## 2015-07-25 LAB — BRAIN NATRIURETIC PEPTIDE: B NATRIURETIC PEPTIDE 5: 1017.4 pg/mL — AB (ref 0.0–100.0)

## 2015-07-25 LAB — APTT: APTT: 34 s (ref 24–37)

## 2015-07-25 LAB — PROTIME-INR
INR: 1.08 (ref 0.00–1.49)
PROTHROMBIN TIME: 13.8 s (ref 11.6–15.2)

## 2015-07-25 LAB — TROPONIN I: TROPONIN I: 0.05 ng/mL — AB (ref <0.031)

## 2015-07-25 MED ORDER — ACETAMINOPHEN 500 MG PO TABS
500.0000 mg | ORAL_TABLET | Freq: Every day | ORAL | Status: DC | PRN
Start: 2015-07-25 — End: 2015-07-27

## 2015-07-25 MED ORDER — SODIUM CHLORIDE 0.9 % IV SOLN: 250.0000 mL | INTRAVENOUS | Status: DC | PRN

## 2015-07-25 MED ORDER — DILTIAZEM HCL 60 MG PO TABS
60.0000 mg | ORAL_TABLET | Freq: Two times a day (BID) | ORAL | Status: DC
  Administered 2015-07-25 – 2015-07-27 (×4): 60 mg via ORAL
  Filled 2015-07-25 (×4): qty 1

## 2015-07-25 MED ORDER — SODIUM CHLORIDE 0.9% FLUSH
3.0000 mL | Freq: Two times a day (BID) | INTRAVENOUS | Status: DC
  Administered 2015-07-26: 3 mL via INTRAVENOUS

## 2015-07-25 MED ORDER — DIPHENHYDRAMINE HCL 25 MG PO CAPS
25.0000 mg | ORAL_CAPSULE | Freq: Four times a day (QID) | ORAL | Status: DC | PRN
Start: 2015-07-25 — End: 2015-07-27

## 2015-07-25 MED ORDER — SODIUM CHLORIDE 0.9% FLUSH
3.0000 mL | INTRAVENOUS | Status: DC | PRN
Start: 2015-07-25 — End: 2015-07-27

## 2015-07-25 MED ORDER — METOPROLOL TARTRATE 25 MG PO TABS
12.5000 mg | ORAL_TABLET | Freq: Two times a day (BID) | ORAL | Status: DC
  Administered 2015-07-25 – 2015-07-27 (×4): 12.5 mg via ORAL
  Filled 2015-07-25 (×4): qty 1

## 2015-07-25 MED ORDER — FUROSEMIDE 10 MG/ML IJ SOLN
40.0000 mg | Freq: Every day | INTRAMUSCULAR | Status: DC
  Administered 2015-07-26: 40 mg via INTRAVENOUS
  Filled 2015-07-25: qty 4

## 2015-07-25 MED ORDER — ENOXAPARIN SODIUM 60 MG/0.6ML ~~LOC~~ SOLN
1.0000 mg/kg | Freq: Every day | SUBCUTANEOUS | Status: DC
  Administered 2015-07-26 (×2): 60 mg via SUBCUTANEOUS
  Filled 2015-07-25 (×2): qty 0.6

## 2015-07-25 MED ORDER — COLLAGENASE 250 UNIT/GM EX OINT
1.0000 "application " | TOPICAL_OINTMENT | Freq: Every day | CUTANEOUS | Status: DC
  Administered 2015-07-26: 1 via TOPICAL
  Filled 2015-07-25: qty 30

## 2015-07-25 MED ORDER — GABAPENTIN 100 MG PO CAPS
100.0000 mg | ORAL_CAPSULE | Freq: Every day | ORAL | Status: DC
  Administered 2015-07-25 – 2015-07-26 (×2): 100 mg via ORAL
  Filled 2015-07-25 (×2): qty 1

## 2015-07-25 MED ORDER — ONDANSETRON HCL 4 MG/2ML IJ SOLN: 4.0000 mg | Freq: Four times a day (QID) | INTRAMUSCULAR | Status: DC | PRN

## 2015-07-25 MED ORDER — PANTOPRAZOLE SODIUM 40 MG PO TBEC
40.0000 mg | DELAYED_RELEASE_TABLET | Freq: Every day | ORAL | Status: DC
  Administered 2015-07-25 – 2015-07-26 (×2): 40 mg via ORAL
  Filled 2015-07-25 (×2): qty 1

## 2015-07-25 MED ORDER — PREDNISONE 10 MG PO TABS
10.0000 mg | ORAL_TABLET | Freq: Every day | ORAL | Status: DC
  Administered 2015-07-26 – 2015-07-27 (×2): 10 mg via ORAL
  Filled 2015-07-25 (×2): qty 1

## 2015-07-25 MED ORDER — FUROSEMIDE 10 MG/ML IJ SOLN
40.0000 mg | Freq: Once | INTRAMUSCULAR | Status: AC
  Administered 2015-07-25: 40 mg via INTRAVENOUS
  Filled 2015-07-25: qty 4

## 2015-07-25 MED ORDER — PRO-STAT SUGAR FREE PO LIQD
30.0000 mL | Freq: Two times a day (BID) | ORAL | Status: DC
  Administered 2015-07-26 – 2015-07-27 (×4): 30 mL via ORAL
  Filled 2015-07-25 (×4): qty 30

## 2015-07-25 MED ORDER — LIDOCAINE-PRILOCAINE 2.5-2.5 % EX CREA: 1.0000 "application " | TOPICAL_CREAM | CUTANEOUS | Status: DC | PRN

## 2015-07-25 MED ORDER — LOPERAMIDE HCL 2 MG PO CAPS: 2.0000 mg | ORAL_CAPSULE | Freq: Four times a day (QID) | ORAL | Status: DC | PRN

## 2015-07-25 MED ORDER — FUROSEMIDE 10 MG/ML IJ SOLN: 20.0000 mg | Freq: Once | INTRAMUSCULAR | Status: DC

## 2015-07-25 NOTE — ED Provider Notes (Signed)
CSN: [id number]     Arrival date & time 07/25/15  1855 History   First MD Initiated Contact with Patient 07/25/15 1933     Chief Complaint  Patient presents with  . Shortness of Breath     Patient is a 82 y.o. female presenting with shortness of breath. The history is provided by the patient and a relative. No language interpreter was used.  Shortness of Breath  [name] is a 82 y.o. female who presents to the Emergency Department complaining of SOB.  History is provided by the patient and her daughter. She has a history of stage IV lung cancer as well as CKD. She underwent a hospitalization from January 13 to January 30 followed by her readmission a week ago. She was discharged 3 days ago. Daughter reports that the patient has had increased shortness of breath and work of breathing since the time of discharge. No reports of fevers, vomiting, pain. The patient has intermittent bilateral lower extremity edema and this is near its baseline. She is currently receiving Lovenox injections for lower extremity DVT as well as internal jugular thrombosis. When EMS arrived the patient had considerable increased work of breathing despite sats 93% and she was started on 4 L nasal cannula. Currently the patient states that her shortness of breath is much improved compared to earlier. She states that the symptoms come and go with no clear alleviating or exacerbating factors.  Past Medical History  Diagnosis Date  . Hypertension   . Hypercholesterolemia   . CKD (chronic kidney disease)   . Coronary artery disease     a.  LHC (06/04/05): LHC done in [place] with high grade RCA => s/p BMS to RCA;  b.  Nuclear (09/14/09): Lexiscan; Inf infarct with mild peri-infarct ishemia, EF 52%; Low Risk.  Marland Kitchen GERD (gastroesophageal reflux disease)   . Chronic anemia   . Ischemic cardiomyopathy     a. Echo (07/26/13): Mild LVH, EF 35-40%, diff HK, inf AK, Gr 2 DD, Tr AI, mildly dilated Ao root, MAC, mild MR, mild  LAE, mod reduced RVSF.  . Non-small cell carcinoma of lung ([place])     Stage IV  . Chronic fatigue 04/04/2015  . Chronic fatigue 04/04/2015  . A-fib ([place])   . DVT (deep vein thrombosis) in pregnancy   . Chronic diastolic (congestive) heart failure [facility])    Past Surgical History  Procedure Laterality Date  . Hematoma evacuation  December 2006    groin  . Appendectomy    . Cataract extraction    . Hemorroidectomy    . Laminectomy     Family History  Problem Relation Age of Onset  . Heart failure Mother   . Lung cancer Sister   . Lung cancer Brother   . Stomach cancer Brother   . Heart attack Neg Hx   . Stroke Neg Hx    Social History  Substance Use Topics  . Smoking status: Former Smoker    Types: Cigarettes  . Smokeless tobacco: Never Used  . Alcohol Use: No   OB History    No data available     Review of Systems  Respiratory: Positive for shortness of breath.   All other systems reviewed and are negative.     Allergies  Amlodipine; Ciprofloxacin; Mirtazapine; Statins; Aspirin; Cephalexin; Hydralazine; Iron; Ambien; Lorazepam; Sulfonamide derivatives; Zolpidem; Penicillins; Shellfish allergy; and Tape  Home Medications   Prior to Admission medications   Medication Sig Start Date End Date Taking? Authorizing  Provider  diltiazem (CARDIZEM) 60 MG tablet Take 1 tablet (60 mg total) by mouth 2 (two) times daily. 07/22/15  Yes Lavina Hamman, MD  enoxaparin (LOVENOX) 60 MG/0.6ML injection Inject 0.6 mLs (60 mg total) into the skin daily. 07/02/15  Yes Robbie Lis, MD  furosemide (LASIX) 20 MG tablet Take 1 tablet (20 mg total) by mouth daily. 07/02/15  Yes Robbie Lis, MD  gabapentin (NEURONTIN) 100 MG capsule Take 1 capsule (100 mg total) by mouth at bedtime. 07/02/15  Yes Robbie Lis, MD  lidocaine-prilocaine (EMLA) cream Apply 1 application topically as needed. 03/17/15  Yes Curt Bears, MD  metoprolol tartrate (LOPRESSOR) 25 MG tablet Take 0.5 tablets (12.5 mg  total) by mouth 2 (two) times daily. 07/22/15  Yes Lavina Hamman, MD  pantoprazole (PROTONIX) 40 MG tablet Take 40 mg by mouth at bedtime.   Yes Historical Provider, MD  predniSONE (DELTASONE) 10 MG tablet Take 1 tablet (10 mg total) by mouth daily with breakfast. 07/23/15  Yes Lavina Hamman, MD  SANTYL ointment Apply 1 application topically at bedtime as needed. For bed sores 12/27/14  Yes Historical Provider, MD  acetaminophen (TYLENOL) 500 MG tablet Take 500 mg by mouth daily as needed for mild pain.    Historical Provider, MD  Amino Acids-Protein Hydrolys (FEEDING SUPPLEMENT, PRO-STAT SUGAR FREE 64,) LIQD Take 30 mLs by mouth 2 (two) times daily. 07/22/15   Lavina Hamman, MD  diphenhydrAMINE (BENADRYL) 25 mg capsule Take 1 capsule (25 mg total) by mouth every 6 (six) hours as needed. Patient taking differently: Take 25 mg by mouth every 6 (six) hours as needed for allergies.  07/03/15   Robbie Lis, MD  famotidine (PEPCID) 20 MG tablet Take 1 tablet (20 mg total) by mouth daily. Patient not taking: Reported on 07/25/2015 07/03/15   Robbie Lis, MD  feeding supplement, ENSURE ENLIVE, (ENSURE ENLIVE) LIQD Take 237 mLs by mouth 2 (two) times daily between meals. Patient not taking: Reported on 07/25/2015 07/22/15   Lavina Hamman, MD  fosfomycin (MONUROL) 3 g PACK Take 3 g by mouth once. Patient taking differently: Take 3 g by mouth once a week.  07/25/15   Lavina Hamman, MD  loperamide (IMODIUM) 2 MG capsule Take 1 capsule (2 mg total) by mouth every 6 (six) hours as needed for diarrhea or loose stools. 07/02/15   Robbie Lis, MD   BP 179/94 mmHg  Pulse 73  Temp(Src) 97.9 F (36.6 C) (Oral)  Resp 25  Ht '5\' 8"'$  (1.727 m)  Wt 140 lb (63.504 kg)  BMI 21.29 kg/m2  SpO2 100% Physical Exam  Constitutional: She is oriented to person, place, and time.  Chronically ill-appearing  HENT:  Head: Normocephalic and atraumatic.  Cardiovascular: Normal rate and regular rhythm.   No murmur  heard. Pulmonary/Chest: Effort normal. No respiratory distress.  Fine crackles diffusely. Port-A-Cath in right anterior chest wall.  Abdominal: Soft. There is no tenderness. There is no rebound and no guarding.  Musculoskeletal: She exhibits no tenderness.  3+ pitting edema bilateral lower extremities, slightly greater in the right compared to the left. Significant weeping skin over the right lower extremity  Neurological: She is alert and oriented to person, place, and time.  Skin: Skin is warm and dry.  Multiple ecchymoses of her bilateral upper extremities  Psychiatric: She has a normal mood and affect. Her behavior is normal.  Nursing note and vitals reviewed.   ED Course  Procedures (including critical care time) Labs Review Labs Reviewed  COMPREHENSIVE METABOLIC PANEL - Abnormal; Notable for the following:    Chloride 116 (*)    CO2 21 (*)    Glucose, Bld 134 (*)    BUN 46 (*)    Creatinine, Ser 1.87 (*)    Calcium 8.0 (*)    Total Protein 5.3 (*)    Albumin 2.4 (*)    AST 42 (*)    Alkaline Phosphatase 151 (*)    GFR calc non Af Amer 24 (*)    GFR calc Af Amer 28 (*)    All other components within normal limits  CBC WITH DIFFERENTIAL/PLATELET - Abnormal; Notable for the following:    RBC 3.31 (*)    Hemoglobin 9.7 (*)    HCT 31.2 (*)    RDW 17.9 (*)    Lymphs Abs 0.2 (*)    All other components within normal limits  BRAIN NATRIURETIC PEPTIDE - Abnormal; Notable for the following:    B Natriuretic Peptide 1017.4 (*)    All other components within normal limits  BLOOD GAS, VENOUS - Abnormal; Notable for the following:    pH, Ven 7.248 (*)    pO2, Ven 75.4 (*)    Acid-base deficit 6.1 (*)    All other components within normal limits  TROPONIN I - Abnormal; Notable for the following:    Troponin I 0.05 (*)    All other components within normal limits  PROTIME-INR  APTT  BASIC METABOLIC PANEL  TROPONIN I  TROPONIN I  Randolm Idol, ED    Imaging  Review Dg Chest Port 1 View  07/25/2015  CLINICAL DATA:  Lung cancer with shortness of breath. EXAM: PORTABLE CHEST 1 VIEW COMPARISON:  06/16/2015 FINDINGS: 2000 hours. Hyperexpansion is consistent with emphysema. Interstitial markings are diffusely coarsened with chronic features. Vascular congestion with probable interstitial edema, progressed in the interval. There is bilateral lower lung airspace disease with pleural effusions, progressed on the left. 1 cm spiculated nodule seen in the left upper lung on the previous study is less evident today. The cardio pericardial silhouette is enlarged. Right Port-A-Cath remains in place with tip overlying the mid SVC level. Bones are diffusely demineralized. Telemetry leads overlie the chest. IMPRESSION: Hyperexpansion consistent with emphysema. Interval development of vascular congestion with increased interstitial disease, likely reflecting pulmonary edema. Persistent bibasilar collapse/consolidation, progressed on the left, with bilateral pleural effusions. Electronically Signed   By: Misty Stanley M.D.   On: 07/25/2015 20:14   I have personally reviewed and evaluated these images and lab results as part of my medical decision-making.   EKG Interpretation   Date/Time:  Tuesday July 25 2015 19:09:18 EST Ventricular Rate:  70 PR Interval:  209 QRS Duration: 79 QT Interval:  521 QTC Calculation: 562 R Axis:   13 Text Interpretation:  Sinus rhythm Low voltage, extremity leads Prolonged  QT interval No significant change since last tracing' \\E'$ \ Confirmed by  Winfred Leeds  MD, SAM [phone]) on 07/25/2015 7:17:18 PM      MDM   Final diagnoses:  Acute congestive heart failure, unspecified congestive heart failure type [facility])    Patient with history of lung cancer here for increased shortness of breath and respiratory distress at home. She is edematous on examination with crackles in her lung fields. Chest x-ray shows pulmonary edema. She has not been  taking her Lasix since hospital discharge. Plan to provide some Lasix in the emergency department and admit for further management.  Quintella Reichert, MD 07/26/15 [phone]

## 2015-07-25 NOTE — ED Notes (Signed)
Hospitalist at bedside 

## 2015-07-25 NOTE — ED Notes (Signed)
PT WANTS PORT ACCESSED. INFORMED RN.

## 2015-07-25 NOTE — ED Notes (Signed)
Bed: EA54 Expected date:  Expected time:  Means of arrival:  Comments: ELDERLY resp distress

## 2015-07-25 NOTE — ED Notes (Signed)
Patient is from home and transported by [facility]. Patient has a history of lung cancer and complaining of shortness of breath. Per EMS, reports right lung is clear and diminished in the left movement. Pt has pedal edema and urinary catheter. Also, paralyzed in right leg.

## 2015-07-25 NOTE — ED Notes (Signed)
MD at bedside. 

## 2015-07-25 NOTE — H&P (Signed)
[facility] History and Physical  [name] XJO:[id number] DOB: 1933-06-15 DOA: 07/25/2015  Referring physician: ED physician PCP: Wende Neighbors, MD  Specialists:   Chief Complaint: SOB  HPI: [name] is a 82 y.o. female with PMH of hypertension, chronic kidney disease stage IV, stage IV non-small cell lung cancer with metastasis to the lumbar spine, and pressure sores, hypertension, CAD, diastolic congestive heart failure, right leg DVT on lovenox, atrial fibrillation, right leg paralysis, who presents with SOB.  Patient reports that she has worsening shortness of breath today. No cough, chest pain, fever or chills. She has worsening leg edema. She states that she stopped taking Lasix 3 days ago. Patient does not have abdominal pain, diarrhea, symptoms of UTI. She has right leg paralysis, but no new unilateral weakness.  In ED, patient was found to have BNP 1017.4, negative troponin, WBC 5.4, temperature normal, no tachycardia, stable renal function. CXR showed emphysema, vascular congestion, persistent bibasilar collapse/consolidation, progressed on the left, with bilateral pleural effusions. Patient's admitted to inpatient for further evaluation treatment.  EKG: Independently reviewed. QTC 562, T-wave flattening, low voltage in limb leads.  Where does patient live?   At home  Can patient participate in ADLs?  None  Review of Systems:   General: no fevers, chills, no changes in body weight, has poor appetite, has fatigue HEENT: no blurry vision, hearing changes or sore throat Pulm: has dyspnea, no coughing, wheezing CV: no chest pain, no palpitations Abd: no nausea, vomiting, abdominal pain, diarrhea, constipation GU: no dysuria, burning on urination, increased urinary frequency, hematuria  Ext: has leg edema Neuro: has right leg paralysis. no vision change or hearing loss Skin: no rash MSK: No muscle spasm, no deformity, no limitation of range of movement in  spin Heme: No easy bruising.  Travel history: No recent long distant travel.  Allergy:  Allergies  Allergen Reactions  . Amlodipine Swelling  . Ciprofloxacin Hives and Other (See Comments)    REACTION: weakness  . Mirtazapine Other (See Comments)    Hallucinations and nightmares, verbally aggressive   . Statins Other (See Comments)  . Aspirin Swelling    Mouth swelling, tongue Patient reports that she tolerates ibuprofen without problem  . Cephalexin Hives  . Hydralazine Nausea And Vomiting  . Iron Nausea And Vomiting  . Ambien [Zolpidem Tartrate] Other (See Comments)    confusion  . Lorazepam Other (See Comments)    Hallucinations, verbally aggressive  . Sulfonamide Derivatives Hives  . Zolpidem     Confusion  . Penicillins Other (See Comments)    Has patient had a PCN reaction causing immediate rash, facial/tongue/throat swelling, SOB or lightheadedness with hypotension: Yes Has patient had a PCN reaction causing severe rash involving mucus membranes or skin necrosis: Yes Has patient had a PCN reaction that required hospitalization No Has patient had a PCN reaction occurring within the last 10 years: No If all of the above answers are "NO", then may proceed with Cephalosporin use.   . Shellfish Allergy Hives and Rash  . Tape Rash    Past Medical History  Diagnosis Date  . Hypertension   . Hypercholesterolemia   . CKD (chronic kidney disease)   . Coronary artery disease     a.  LHC (06/04/05): LHC done in [place] with high grade RCA => s/p BMS to RCA;  b.  Nuclear (09/14/09): Lexiscan; Inf infarct with mild peri-infarct ishemia, EF 52%; Low Risk.  Marland Kitchen GERD (gastroesophageal reflux disease)   . Chronic  anemia   . Ischemic cardiomyopathy     a. Echo (07/26/13): Mild LVH, EF 35-40%, diff HK, inf AK, Gr 2 DD, Tr AI, mildly dilated Ao root, MAC, mild MR, mild LAE, mod reduced RVSF.  . Non-small cell carcinoma of lung ([place])     Stage IV  . Chronic fatigue 04/04/2015   . Chronic fatigue 04/04/2015  . A-fib ([place])   . DVT (deep vein thrombosis) in pregnancy   . Chronic diastolic (congestive) heart failure [facility])     Past Surgical History  Procedure Laterality Date  . Hematoma evacuation  December 2006    groin  . Appendectomy    . Cataract extraction    . Hemorroidectomy    . Laminectomy      Social History:  reports that she has quit smoking. Her smoking use included Cigarettes. She has never used smokeless tobacco. She reports that she does not drink alcohol or use illicit drugs.  Family History:  Family History  Problem Relation Age of Onset  . Heart failure Mother   . Lung cancer Sister   . Lung cancer Brother   . Stomach cancer Brother   . Heart attack Neg Hx   . Stroke Neg Hx      Prior to Admission medications   Medication Sig Start Date End Date Taking? Authorizing Provider  diltiazem (CARDIZEM) 60 MG tablet Take 1 tablet (60 mg total) by mouth 2 (two) times daily. 07/22/15  Yes Lavina Hamman, MD  enoxaparin (LOVENOX) 60 MG/0.6ML injection Inject 0.6 mLs (60 mg total) into the skin daily. 07/02/15  Yes Robbie Lis, MD  furosemide (LASIX) 20 MG tablet Take 1 tablet (20 mg total) by mouth daily. 07/02/15  Yes Robbie Lis, MD  gabapentin (NEURONTIN) 100 MG capsule Take 1 capsule (100 mg total) by mouth at bedtime. 07/02/15  Yes Robbie Lis, MD  lidocaine-prilocaine (EMLA) cream Apply 1 application topically as needed. 03/17/15  Yes Curt Bears, MD  metoprolol tartrate (LOPRESSOR) 25 MG tablet Take 0.5 tablets (12.5 mg total) by mouth 2 (two) times daily. 07/22/15  Yes Lavina Hamman, MD  pantoprazole (PROTONIX) 40 MG tablet Take 40 mg by mouth at bedtime.   Yes Historical Provider, MD  predniSONE (DELTASONE) 10 MG tablet Take 1 tablet (10 mg total) by mouth daily with breakfast. 07/23/15  Yes Lavina Hamman, MD  SANTYL ointment Apply 1 application topically at bedtime as needed. For bed sores 12/27/14  Yes Historical Provider, MD   acetaminophen (TYLENOL) 500 MG tablet Take 500 mg by mouth daily as needed for mild pain.    Historical Provider, MD  Amino Acids-Protein Hydrolys (FEEDING SUPPLEMENT, PRO-STAT SUGAR FREE 64,) LIQD Take 30 mLs by mouth 2 (two) times daily. 07/22/15   Lavina Hamman, MD  diphenhydrAMINE (BENADRYL) 25 mg capsule Take 1 capsule (25 mg total) by mouth every 6 (six) hours as needed. Patient taking differently: Take 25 mg by mouth every 6 (six) hours as needed for allergies.  07/03/15   Robbie Lis, MD  famotidine (PEPCID) 20 MG tablet Take 1 tablet (20 mg total) by mouth daily. Patient not taking: Reported on 07/25/2015 07/03/15   Robbie Lis, MD  feeding supplement, ENSURE ENLIVE, (ENSURE ENLIVE) LIQD Take 237 mLs by mouth 2 (two) times daily between meals. Patient not taking: Reported on 07/25/2015 07/22/15   Lavina Hamman, MD  fosfomycin (MONUROL) 3 g PACK Take 3 g by mouth once. Patient taking differently: Take  3 g by mouth once a week.  07/25/15   Lavina Hamman, MD  loperamide (IMODIUM) 2 MG capsule Take 1 capsule (2 mg total) by mouth every 6 (six) hours as needed for diarrhea or loose stools. 07/02/15   Robbie Lis, MD    Physical Exam: Filed Vitals:   07/25/15 2313 07/25/15 2330 07/25/15 2343 07/26/15 0000  BP: 167/85 167/85 167/85 159/90  Pulse: 83 80 77 73  Temp:      TempSrc:      Resp:  '15 28 15  '$ Height:      Weight:      SpO2:  100% 100% 98%   General: Not in acute distress HEENT:       Eyes: PERRL, EOMI, no scleral icterus.       ENT: No discharge from the ears and nose, no pharynx injection, no tonsillar enlargement.        Neck: Positive JVD, no bruit, no mass felt. Heme: No neck lymph node enlargement. Cardiac: S1/S2, RRR, No murmurs, No gallops or rubs. Pulm: Decreased air movement bilaterally. No rales, wheezing, rhonchi or rubs. Abd: Soft, nondistended, nontender, no rebound pain, no organomegaly, BS present. Ext: 2+ pitting leg edema bilaterally. 2+DP/PT pulse  bilaterally. Musculoskeletal: No joint deformities, No joint redness or warmth, no limitation of ROM in spin. Skin: No rashes.  Neuro: Alert, oriented X3, cranial nerves II-XII grossly intact, has right leg paralysis. Psych: Patient is not psychotic, no suicidal or hemocidal ideation.  Labs on Admission:  Basic Metabolic Panel:  Recent Labs Lab 07/20/15 0320 07/21/15 0234 07/21/15 1235 07/22/15 0352 07/25/15 2030  NA 144 146* 143 144 145  K 4.0 4.3 4.6 4.7 4.7  CL 114* 114* 112* 116* 116*  CO2 20* 22 21* 22 21*  GLUCOSE 111* 95 132* 90 134*  BUN 68* 64* 64* 60* 46*  CREATININE 1.73* 1.93* 2.04* 1.83* 1.87*  CALCIUM 7.5* 7.8* 7.8* 7.7* 8.0*   Liver Function Tests:  Recent Labs Lab 07/19/15 0900 07/20/15 0320 07/21/15 0234 07/25/15 2030  AST 48* 34 37 42*  ALT 75* 60* 58* 52  ALKPHOS 158* 139* 133* 151*  BILITOT 0.6 0.3 0.3 0.4  PROT 5.1* 4.7* 4.8* 5.3*  ALBUMIN 2.0* 1.9* 2.0* 2.4*   No results for input(s): LIPASE, AMYLASE in the last 168 hours. No results for input(s): AMMONIA in the last 168 hours. CBC:  Recent Labs Lab 07/20/15 0320 07/25/15 2030  WBC 5.4 5.4  NEUTROABS 4.2 5.0  HGB 8.2* 9.7*  HCT 26.7* 31.2*  MCV 93.0 94.3  PLT 200 186   Cardiac Enzymes:  Recent Labs Lab 07/25/15 2258  TROPONINI 0.05*    BNP (last 3 results)  Recent Labs  07/25/15 2030  BNP 1017.4*    ProBNP (last 3 results) No results for input(s): PROBNP in the last 8760 hours.  CBG:  Recent Labs Lab 07/19/15 0801 07/20/15 0810 07/21/15 0710 07/22/15 0749  GLUCAP 75 85 111* 73    Radiological Exams on Admission: Dg Chest Port 1 View  07/25/2015  CLINICAL DATA:  Lung cancer with shortness of breath. EXAM: PORTABLE CHEST 1 VIEW COMPARISON:  06/16/2015 FINDINGS: 2000 hours. Hyperexpansion is consistent with emphysema. Interstitial markings are diffusely coarsened with chronic features. Vascular congestion with probable interstitial edema, progressed in the  interval. There is bilateral lower lung airspace disease with pleural effusions, progressed on the left. 1 cm spiculated nodule seen in the left upper lung on the previous study is less evident today.  The cardio pericardial silhouette is enlarged. Right Port-A-Cath remains in place with tip overlying the mid SVC level. Bones are diffusely demineralized. Telemetry leads overlie the chest. IMPRESSION: Hyperexpansion consistent with emphysema. Interval development of vascular congestion with increased interstitial disease, likely reflecting pulmonary edema. Persistent bibasilar collapse/consolidation, progressed on the left, with bilateral pleural effusions. Electronically Signed   By: Misty Stanley M.D.   On: 07/25/2015 20:14    Assessment/Plan Principal Problem:   Acute on chronic respiratory failure (HCC) Active Problems:   Lung cancer stage IV with Metastatic squamous cell carcinoma to bone (HCC)   Acute renal failure superimposed on stage 3 chronic kidney disease (HCC)   Anemia of chronic disease   Malnutrition of moderate degree   Right leg DVT (HCC)   Atrial fibrillation (HCC)   Acute on chronic diastolic (congestive) heart failure (HCC)   SOB (shortness of breath)   Acute on chronic respiratory failure ([place]): most likely due to acute on chronic diastolic (congestive) heart failure. Patient has stopped taking Lasix in the past 3 days, presenting with bilateral leg edema, positive DVT and elevated BNP, consistent with CHF exacerbation. Her bilateral pleural effusion and collapsed along may also partially contributed to her shortness of breath. Patient does not have signs of infection, less likely to have pneumonia.   -will admit to tele bed -Lasix 40 mg daily by IV -trop x 3 -will not repeat 2d echo since pt had one recently on 06/18/15, which showed EF 50-55 percent with grade 1 diastolic dysfunction. -will continue home metoprolol -Daily weights -strict I/O's -Low salt diet -No  lisinopril due to CKD-IV.  Pressure wounds: Sacrum and right hip involved  - Wound care consultation requested   Stage 4 non-small cell lung cancer:  Metastasis to L5. Managed by Dr. Julien Nordmann, her last chemotherapy was on 05/26/15 -Follow-up with Dr. Julien Nordmann -on prednisone 10 mg daily  Anemia of chronic disease: Hgb 9.7 on admission, consistent with her baseline  - Monitorby CBC  DVT, right leg: on Lovenox at home -continue lovenox '1mg'$ /kg  Atrial Fibrillation: CHA2DS2-VASc Score is 5, needs AC. Patient is on lovenox for both DVT and afib at home. Heart rate is well controlled. -continue lovenox and metoprolol and cardizem  Goal of care: I spent lengthy time with patient and her daughter. Her daughter is not realistic. Both patient and her daughter still want pt to be on full code. They refused palliative care consult strongly. This needs to be discussed again and again.   DVT ppx: on SQ Lovenox  Code Status: Full code Family Communication:  Yes, patient's daughter  at bed side Disposition Plan: Admit to inpatient   Date of Service 07/26/2015    Ivor Costa [facility] Pager [phone]  If 7PM-7AM, please contact night-coverage [email or web address] Password [facility] 07/26/2015, 12:47 AM

## 2015-07-26 DIAGNOSIS — J9620 Acute and chronic respiratory failure, unspecified whether with hypoxia or hypercapnia: Secondary | ICD-10-CM

## 2015-07-26 DIAGNOSIS — C7951 Secondary malignant neoplasm of bone: Secondary | ICD-10-CM

## 2015-07-26 DIAGNOSIS — N183 Chronic kidney disease, stage 3 (moderate): Secondary | ICD-10-CM

## 2015-07-26 DIAGNOSIS — N179 Acute kidney failure, unspecified: Secondary | ICD-10-CM

## 2015-07-26 LAB — BASIC METABOLIC PANEL
Anion gap: 8 (ref 5–15)
BUN: 45 mg/dL — AB (ref 6–20)
CALCIUM: 7.9 mg/dL — AB (ref 8.9–10.3)
CHLORIDE: 116 mmol/L — AB (ref 101–111)
CO2: 21 mmol/L — AB (ref 22–32)
CREATININE: 1.76 mg/dL — AB (ref 0.44–1.00)
GFR calc Af Amer: 31 mL/min — ABNORMAL LOW (ref >60)
GFR calc non Af Amer: 26 mL/min — ABNORMAL LOW (ref >60)
GLUCOSE: 82 mg/dL (ref 65–99)
Potassium: 4.2 mmol/L (ref 3.5–5.1)
Sodium: 145 mmol/L (ref 135–145)

## 2015-07-26 LAB — TROPONIN I
TROPONIN I: 0.05 ng/mL — AB (ref <0.031)
Troponin I: 0.05 ng/mL — ABNORMAL HIGH (ref <0.031)

## 2015-07-26 MED ORDER — LIDOCAINE-PRILOCAINE 2.5-2.5 % EX CREA: 1.0000 "application " | TOPICAL_CREAM | CUTANEOUS | Status: DC | PRN

## 2015-07-26 MED ORDER — COLLAGENASE 250 UNIT/GM EX OINT
1.0000 "application " | TOPICAL_OINTMENT | Freq: Every day | CUTANEOUS | Status: DC
  Administered 2015-07-26 – 2015-07-27 (×2): 1 via TOPICAL
  Filled 2015-07-26: qty 30

## 2015-07-26 MED ORDER — FUROSEMIDE 10 MG/ML IJ SOLN
40.0000 mg | Freq: Two times a day (BID) | INTRAMUSCULAR | Status: DC
  Administered 2015-07-26 – 2015-07-27 (×2): 40 mg via INTRAVENOUS
  Filled 2015-07-26 (×2): qty 4

## 2015-07-26 MED ORDER — SODIUM CHLORIDE 0.9% FLUSH
10.0000 mL | INTRAVENOUS | Status: DC | PRN
Start: 2015-07-26 — End: 2015-07-27
  Administered 2015-07-27 (×2): 10 mL
  Filled 2015-07-26 (×2): qty 40

## 2015-07-26 NOTE — Progress Notes (Signed)
ED CM notified [facility] and [facility] staff of pt re admission to be followed for d/c needs

## 2015-07-26 NOTE — Consult Note (Addendum)
WOC wound consult note Pt is familiar to [facility] team from recent admission; refer to progress notes on 2/14. Reason for Consult: Chronic Stage 4 on sacrum, acute skin tears, healed Stage 3 PrIs on right hip. Wound type:Pressure, trauma Pressure Ulcer POA: Yes Measurement: Sacrum, Stage 4: 5cm x 3.5cm x 0.3cm with undermining at 12 o'clock measuring 0.2cm. Right hip with previously noted stage 3 pressure injury which has almost healed; /5X.5X.1cm pink and dry Right heel with Stage 1 Pressure Injury measuring 3cm round with no depth.  Skin tears on right LE measures 1.5cm x 1.5cm x 0.1cm, on right upper arm measures 2cm x 2cm x 0.1cm, on left arm 1X1X.1cm, 2X,.5X.1cm.  All areas are red and moist, partial thickness with small amt pink drainage Wound ZDG:UYQIHK is 80% red and moist, interspersed with 20% yellow. Drainage (amount, consistency, odor) Moderate amount of light yellow exudate from sacrum, no odor Periwound:intact. Dressing procedure/placement/frequencyPatient states has been using Santyl ointment to sacrum; will continue present plan of care.  Air mattress to reduce pressure.  Float heels to reduce pressure.  Vaseline gauze to skin tears with tegaderm to promote healing. Foam dressings to protect right heel and right hip.  Discussed plan of care and she verbalized understanding. Please re-consult if further assistance is needed.  Thank-you,  Julien Girt MSN, [place], [place], [place], [place]

## 2015-07-26 NOTE — Care Management Note (Signed)
Case Management Note  Patient Details  Name: CANNON QUINTON MRN: [id number] Date of Birth: [birth year]  Subjective/Objective:  82 y/o f admitted w/CHF. Readmit. GB:TDVV Ca, sacral/hip wounds.Active w/[facility].Noted MD-poor prognosis.Recc palliative cons.                  Action/Plan:d/c plan home.   Expected Discharge Date:   (unknown)               Expected Discharge Plan:  [place]  In-House Referral:     Discharge planning Services  CM Consult  Post Acute Care Choice:  [facility]) Choice offered to:     DME Arranged:    DME Agency:     HH Arranged:    HH Agency:     Status of Service:  In process, will continue to follow  Medicare Important Message Given:    Date Medicare IM Given:    Medicare IM give by:    Date Additional Medicare IM Given:    Additional Medicare Important Message give by:     If discussed at [place] of Stay Meetings, dates discussed:    Additional Comments:  Dessa Phi, RN 07/26/2015, 3:33 PM

## 2015-07-26 NOTE — Progress Notes (Signed)
PROGRESS NOTE  [name] HWE:[id number] DOB: 11/20/1933 DOA: 07/25/2015 PCP: Wende Neighbors, MD  82 yo female, just discharged from the hospital 2/18.  She says when she was d/c'd, she did not have any of her lasix at home so has not taken any.  Refused palliative care consult as well as rehab.  Poor prognosis and patient got very upset when this was discussed with her.  Assessment/Plan: Acute on chronic respiratory failure ([place]): most likely due to acute on chronic diastolic (congestive) heart failure.  - stopped taking Lasix in the past 3 days -elevated BNP - tele bed -Lasix 40 mg BID- IV -will not repeat 2d echo since pt had one recently on 06/18/15, which showed EF 50-55 percent with grade 1 diastolic dysfunction. -will continue home metoprolol -Daily weights -strict I/O's- down 1.5L total -Low salt diet -No lisinopril due to CKD-IV.  Pressure wounds: Sacrum and right hip involved  - Wound care consultation requested- prevalon boots and low air loss mattress requested  Stage 4 non-small cell lung cancer: Metastasis to L5. Managed by Dr. Julien Nordmann -palliative per notes -on prednisone 10 mg daily  Anemia of chronic disease: Hgb 9.7 on admission, consistent with her baseline  - Monitorby CBC  DVT, right leg: on Lovenox at home -continue lovenox '1mg'$ /kg  Atrial Fibrillation: CHA2DS2-VASc Score is 5, needs AC. Patient is on lovenox for both DVT and afib at home. Heart rate is well controlled. -continue lovenox and metoprolol and cardizem  PT eval  Code Status: full Family Communication: granddaughter at bedside Disposition Plan: poor overall prognosis-- insisted on full code, will not go to rehab--   Consultants:    Procedures:      HPI/Subjective: Angry--- "I will not go to rehab".  "[place]" -ran out of lasix and did not tell anyone   Objective: Filed Vitals:   07/26/15 1200 07/26/15 1230  BP: 105/56 109/51  Pulse: 56 54  Temp:     Resp: 14 14    Intake/Output Summary (Last 24 hours) at 07/26/15 1315 Last data filed at 07/26/15 1027  Gross per 24 hour  Intake    125 ml  Output   1675 ml  Net  -1550 ml   Filed Weights   07/25/15 1907  Weight: 63.504 kg (140 lb)    Exam:   General:  Awake, angry  Cardiovascular: rrr  Respiratory: no wheezing, diminished  Abdomen: +BS, soft  Musculoskeletal: +edema  Data Reviewed: Basic Metabolic Panel:  Recent Labs Lab 07/21/15 0234 07/21/15 1235 07/22/15 0352 07/25/15 2030 07/26/15 0514  NA 146* 143 144 145 145  K 4.3 4.6 4.7 4.7 4.2  CL 114* 112* 116* 116* 116*  CO2 22 21* 22 21* 21*  GLUCOSE 95 132* 90 134* 82  BUN 64* 64* 60* 46* 45*  CREATININE 1.93* 2.04* 1.83* 1.87* 1.76*  CALCIUM 7.8* 7.8* 7.7* 8.0* 7.9*   Liver Function Tests:  Recent Labs Lab 07/20/15 0320 07/21/15 0234 07/25/15 2030  AST 34 37 42*  ALT 60* 58* 52  ALKPHOS 139* 133* 151*  BILITOT 0.3 0.3 0.4  PROT 4.7* 4.8* 5.3*  ALBUMIN 1.9* 2.0* 2.4*   No results for input(s): LIPASE, AMYLASE in the last 168 hours. No results for input(s): AMMONIA in the last 168 hours. CBC:  Recent Labs Lab 07/20/15 0320 07/25/15 2030  WBC 5.4 5.4  NEUTROABS 4.2 5.0  HGB 8.2* 9.7*  HCT 26.7* 31.2*  MCV 93.0 94.3  PLT 200 186  Cardiac Enzymes:  Recent Labs Lab 07/25/15 2258 07/26/15 0514  TROPONINI 0.05* 0.05*   BNP (last 3 results)  Recent Labs  07/25/15 2030  BNP 1017.4*    ProBNP (last 3 results) No results for input(s): PROBNP in the last 8760 hours.  CBG:  Recent Labs Lab 07/20/15 0810 07/21/15 0710 07/22/15 0749  GLUCAP 85 111* 73    Recent Results (from the past 240 hour(s))  Urine culture     Status: None   Collection Time: 07/17/15  5:53 PM  Result Value Ref Range Status   Specimen Description URINE, CATHETERIZED  Final   Special Requests NONE  Final   Culture   Final    >=100,000 COLONIES/mL ACINETOBACTER CALCOACETICUS/BAUMANNII  COMPLEX MULTI-DRUG RESISTANT ORGANISM RESULT CALLED TO, READ BACK BY AND VERIFIED WITH: DR PATEL 07/21/15 @ 1100 M VESTAL Performed at [facility]    Report Status 07/21/2015 FINAL  Final   Organism ID, Bacteria ACINETOBACTER CALCOACETICUS/BAUMANNII COMPLEX  Final      Susceptibility   Acinetobacter calcoaceticus/baumannii complex - MIC*    CEFTAZIDIME >=64 RESISTANT Resistant     CEFTRIAXONE >=64 RESISTANT Resistant     CIPROFLOXACIN >=4 RESISTANT Resistant     GENTAMICIN >=16 RESISTANT Resistant     IMIPENEM >=16 RESISTANT Resistant     PIP/TAZO >=128 RESISTANT Resistant     TRIMETH/SULFA >=320 RESISTANT Resistant     AMPICILLIN/SULBACTAM 16 INTERMEDIATE Intermediate     * >=100,000 COLONIES/mL ACINETOBACTER CALCOACETICUS/BAUMANNII COMPLEX  Culture, blood (routine x 2)     Status: None   Collection Time: 07/17/15  6:00 PM  Result Value Ref Range Status   Specimen Description BLOOD LEFT ANTECUBITAL  Final   Special Requests BOTTLES DRAWN AEROBIC AND ANAEROBIC 5CC  Final   Culture   Final    NO GROWTH 5 DAYS Performed at [facility]    Report Status 07/23/2015 FINAL  Final  Culture, blood (routine x 2)     Status: None   Collection Time: 07/17/15 11:24 PM  Result Value Ref Range Status   Specimen Description BLOOD RIGHT ARM  Final   Special Requests BOTTLES DRAWN AEROBIC AND ANAEROBIC [place]  Final   Culture   Final    NO GROWTH 5 DAYS Performed at [facility]    Report Status 07/23/2015 FINAL  Final  MRSA PCR Screening     Status: None   Collection Time: 07/17/15 11:25 PM  Result Value Ref Range Status   MRSA by PCR NEGATIVE NEGATIVE Final    Comment:        The GeneXpert MRSA Assay (FDA approved for NASAL specimens only), is one component of a comprehensive MRSA colonization surveillance program. It is not intended to diagnose MRSA infection nor to guide or monitor treatment for MRSA infections.      Studies: Dg Chest Port 1  View  07/25/2015  CLINICAL DATA:  Lung cancer with shortness of breath. EXAM: PORTABLE CHEST 1 VIEW COMPARISON:  06/16/2015 FINDINGS: 2000 hours. Hyperexpansion is consistent with emphysema. Interstitial markings are diffusely coarsened with chronic features. Vascular congestion with probable interstitial edema, progressed in the interval. There is bilateral lower lung airspace disease with pleural effusions, progressed on the left. 1 cm spiculated nodule seen in the left upper lung on the previous study is less evident today. The cardio pericardial silhouette is enlarged. Right Port-A-Cath remains in place with tip overlying the mid SVC level. Bones are diffusely demineralized. Telemetry leads overlie the chest. IMPRESSION: Hyperexpansion  consistent with emphysema. Interval development of vascular congestion with increased interstitial disease, likely reflecting pulmonary edema. Persistent bibasilar collapse/consolidation, progressed on the left, with bilateral pleural effusions. Electronically Signed   By: Misty Stanley M.D.   On: 07/25/2015 20:14    Scheduled Meds: . collagenase  1 application Topical Daily  . diltiazem  60 mg Oral BID  . enoxaparin  1 mg/kg Subcutaneous QHS  . feeding supplement (PRO-STAT SUGAR FREE 64)  30 mL Oral BID  . furosemide  40 mg Intravenous Daily  . gabapentin  100 mg Oral QHS  . metoprolol tartrate  12.5 mg Oral BID  . pantoprazole  40 mg Oral QHS  . predniSONE  10 mg Oral Q breakfast  . sodium chloride flush  3 mL Intravenous Q12H   Continuous Infusions: . sodium chloride     Antibiotics Given (last 72 hours)    None      Principal Problem:   Acute on chronic respiratory failure (HCC) Active Problems:   Lung cancer stage IV with Metastatic squamous cell carcinoma to bone (HCC)   Acute renal failure superimposed on stage 3 chronic kidney disease (HCC)   Anemia of chronic disease   Malnutrition of moderate degree   Right leg DVT (HCC)   Atrial  fibrillation (HCC)   Acute on chronic diastolic (congestive) heart failure (HCC)   SOB (shortness of breath)    Time spent: 25 min    [facility] Pager [phone]. If 7PM-7AM, please contact night-coverage at [email or web address], password [facility] 07/26/2015, 1:15 PM  LOS: 1 day

## 2015-07-27 DIAGNOSIS — C7951 Secondary malignant neoplasm of bone: Secondary | ICD-10-CM | POA: Diagnosis not present

## 2015-07-27 DIAGNOSIS — E876 Hypokalemia: Secondary | ICD-10-CM | POA: Diagnosis not present

## 2015-07-27 DIAGNOSIS — D630 Anemia in neoplastic disease: Secondary | ICD-10-CM | POA: Diagnosis not present

## 2015-07-27 DIAGNOSIS — N179 Acute kidney failure, unspecified: Secondary | ICD-10-CM | POA: Diagnosis not present

## 2015-07-27 DIAGNOSIS — R279 Unspecified lack of coordination: Secondary | ICD-10-CM | POA: Diagnosis not present

## 2015-07-27 DIAGNOSIS — N390 Urinary tract infection, site not specified: Secondary | ICD-10-CM | POA: Diagnosis not present

## 2015-07-27 DIAGNOSIS — E440 Moderate protein-calorie malnutrition: Secondary | ICD-10-CM

## 2015-07-27 DIAGNOSIS — L89154 Pressure ulcer of sacral region, stage 4: Secondary | ICD-10-CM | POA: Diagnosis not present

## 2015-07-27 DIAGNOSIS — L89813 Pressure ulcer of head, stage 3: Secondary | ICD-10-CM | POA: Diagnosis not present

## 2015-07-27 DIAGNOSIS — C3480 Malignant neoplasm of overlapping sites of unspecified bronchus and lung: Secondary | ICD-10-CM | POA: Diagnosis not present

## 2015-07-27 DIAGNOSIS — I9589 Other hypotension: Secondary | ICD-10-CM | POA: Diagnosis not present

## 2015-07-27 DIAGNOSIS — I509 Heart failure, unspecified: Secondary | ICD-10-CM | POA: Diagnosis not present

## 2015-07-27 DIAGNOSIS — L89814 Pressure ulcer of head, stage 4: Secondary | ICD-10-CM | POA: Diagnosis not present

## 2015-07-27 DIAGNOSIS — L89153 Pressure ulcer of sacral region, stage 3: Secondary | ICD-10-CM | POA: Diagnosis not present

## 2015-07-27 DIAGNOSIS — D0220 Carcinoma in situ of unspecified bronchus and lung: Secondary | ICD-10-CM | POA: Diagnosis not present

## 2015-07-27 DIAGNOSIS — N183 Chronic kidney disease, stage 3 (moderate): Secondary | ICD-10-CM | POA: Diagnosis not present

## 2015-07-27 DIAGNOSIS — I82409 Acute embolism and thrombosis of unspecified deep veins of unspecified lower extremity: Secondary | ICD-10-CM | POA: Diagnosis not present

## 2015-07-27 DIAGNOSIS — R319 Hematuria, unspecified: Secondary | ICD-10-CM | POA: Diagnosis not present

## 2015-07-27 DIAGNOSIS — Z79899 Other long term (current) drug therapy: Secondary | ICD-10-CM | POA: Diagnosis not present

## 2015-07-27 DIAGNOSIS — G061 Intraspinal abscess and granuloma: Secondary | ICD-10-CM | POA: Diagnosis not present

## 2015-07-27 DIAGNOSIS — I82401 Acute embolism and thrombosis of unspecified deep veins of right lower extremity: Secondary | ICD-10-CM

## 2015-07-27 DIAGNOSIS — R278 Other lack of coordination: Secondary | ICD-10-CM | POA: Diagnosis not present

## 2015-07-27 DIAGNOSIS — R1319 Other dysphagia: Secondary | ICD-10-CM | POA: Diagnosis not present

## 2015-07-27 DIAGNOSIS — R609 Edema, unspecified: Secondary | ICD-10-CM | POA: Diagnosis not present

## 2015-07-27 DIAGNOSIS — D649 Anemia, unspecified: Secondary | ICD-10-CM | POA: Diagnosis not present

## 2015-07-27 DIAGNOSIS — I8291 Chronic embolism and thrombosis of unspecified vein: Secondary | ICD-10-CM | POA: Diagnosis not present

## 2015-07-27 DIAGNOSIS — A4189 Other specified sepsis: Secondary | ICD-10-CM | POA: Diagnosis not present

## 2015-07-27 DIAGNOSIS — L89213 Pressure ulcer of right hip, stage 3: Secondary | ICD-10-CM | POA: Diagnosis not present

## 2015-07-27 DIAGNOSIS — J9620 Acute and chronic respiratory failure, unspecified whether with hypoxia or hypercapnia: Secondary | ICD-10-CM | POA: Diagnosis not present

## 2015-07-27 DIAGNOSIS — L89513 Pressure ulcer of right ankle, stage 3: Secondary | ICD-10-CM | POA: Diagnosis not present

## 2015-07-27 DIAGNOSIS — I959 Hypotension, unspecified: Secondary | ICD-10-CM | POA: Diagnosis not present

## 2015-07-27 DIAGNOSIS — T8851XA Hypothermia following anesthesia, initial encounter: Secondary | ICD-10-CM | POA: Diagnosis not present

## 2015-07-27 LAB — BASIC METABOLIC PANEL
ANION GAP: 8 (ref 5–15)
BUN: 48 mg/dL — ABNORMAL HIGH (ref 6–20)
CHLORIDE: 113 mmol/L — AB (ref 101–111)
CO2: 23 mmol/L (ref 22–32)
Calcium: 7.8 mg/dL — ABNORMAL LOW (ref 8.9–10.3)
Creatinine, Ser: 1.99 mg/dL — ABNORMAL HIGH (ref 0.44–1.00)
GFR calc non Af Amer: 23 mL/min — ABNORMAL LOW (ref >60)
GFR, EST AFRICAN AMERICAN: 26 mL/min — AB (ref >60)
Glucose, Bld: 77 mg/dL (ref 65–99)
POTASSIUM: 4.2 mmol/L (ref 3.5–5.1)
SODIUM: 144 mmol/L (ref 135–145)

## 2015-07-27 LAB — CBC
HEMATOCRIT: 27.2 % — AB (ref 36.0–46.0)
Hemoglobin: 8.3 g/dL — ABNORMAL LOW (ref 12.0–15.0)
MCH: 29 pg (ref 26.0–34.0)
MCHC: 30.5 g/dL (ref 30.0–36.0)
MCV: 95.1 fL (ref 78.0–100.0)
Platelets: 145 10*3/uL — ABNORMAL LOW (ref 150–400)
RBC: 2.86 MIL/uL — AB (ref 3.87–5.11)
RDW: 18.3 % — ABNORMAL HIGH (ref 11.5–15.5)
WBC: 4.1 10*3/uL (ref 4.0–10.5)

## 2015-07-27 MED ORDER — HEPARIN SOD (PORK) LOCK FLUSH 100 UNIT/ML IV SOLN
500.0000 [IU] | INTRAVENOUS | Status: AC | PRN
  Administered 2015-07-27: 500 [IU]

## 2015-07-27 MED ORDER — FUROSEMIDE 40 MG PO TABS: 40.0000 mg | ORAL_TABLET | Freq: Every day | ORAL | Status: DC

## 2015-07-27 MED ORDER — FUROSEMIDE 40 MG PO TABS
40.0000 mg | ORAL_TABLET | Freq: Every day | ORAL | Status: DC
  Administered 2015-07-27: 40 mg via ORAL
  Filled 2015-07-27: qty 1

## 2015-07-27 NOTE — Progress Notes (Signed)
Patient is set to discharge to [facility] SN[facility] today. Patient & daughter, [name] aware. Discharge packet given to RN, Isabell Jarvis called for transport.     Raynaldo Opitz, [facility] Clinical Social Worker cell #: [phone]

## 2015-07-27 NOTE — NC FL2 (Signed)
[place] LEVEL OF CARE SCREENING TOOL     IDENTIFICATION  Patient Name: [name] Birthdate: Apr 01, 1934 Sex: female Admission Date (Current Location): 07/25/2015  [facility] and [place] Number:  Engineer, manufacturing systems and Address:  [facility],  [place], [place]      Provider Number: [id number]  Attending Physician Name and Address:  Geradine Girt, DO  Relative Name and Phone Number:       Current Level of Care: Hospital Recommended Level of Care: [place] Prior Approval Number:    Date Approved/Denied:   PASRR Number: [id number]  Discharge Plan: SNF    Current Diagnoses: Patient Active Problem List   Diagnosis Date Noted  . Acute on chronic respiratory failure ([place]) 07/25/2015  . SOB (shortness of breath) 07/25/2015  . Catheter-associated urinary tract infection ([place]) 07/20/2015  . Atrial fibrillation ([place]) 07/19/2015  . Acute on chronic diastolic (congestive) heart failure ([place]) 07/19/2015  . UTI (lower urinary tract infection) 07/17/2015  . Hyperkalemia 07/17/2015  . Dehydration 07/17/2015  . Right foot ulcer ([place])   . Abscess   . Right leg DVT ([place]) 06/24/2015  . Diarrhea 06/24/2015  . Edema   . Sepsis ([place])   . Malnutrition of moderate degree 06/18/2015  . Hypothermia 06/16/2015  . Paraspinal abscess ([place])   . Acute renal failure superimposed on stage 3 chronic kidney disease ([place]) 03/17/2015  . Anemia of chronic disease 03/17/2015  . Hypokalemia 03/17/2015  . Hypotension 02/23/2015  . Lung cancer stage IV with Metastatic squamous cell carcinoma to bone ([place]) 11/28/2014  . Pressure ulcer 11/24/2014    Orientation RESPIRATION BLADDER Height & Weight     Self, Time, Situation, Place  Normal Incontinent, Indwelling catheter Weight: 134 lb 4.2 oz (60.9 kg) Height:  '5\' 8"'$  (172.7 cm)  BEHAVIORAL SYMPTOMS/MOOD NEUROLOGICAL BOWEL NUTRITION STATUS      Incontinent Diet (2 gram sodium diet)   AMBULATORY STATUS COMMUNICATION OF NEEDS Skin   Extensive Assist Verbally PU Stage and Appropriate Care, Surgical wounds, Other (Comment)                       Personal Care Assistance Level of Assistance  Bathing, Dressing Bathing Assistance: Limited assistance   Dressing Assistance: Limited assistance     Functional Limitations Info             SPECIAL CARE FACTORS FREQUENCY  PT (By licensed PT), OT (By licensed OT)     PT Frequency: 5 OT Frequency: 5            Contractures      Additional Factors Info  Code Status, Allergies, Isolation Precautions Code Status Info: Fullcode Allergies Info: Amlodipine, Ciprofloxacin, Mirtazapine, Statins, Aspirin, Cephalexin, Hydralazine, Iron, Ambien, Lorazepam, Sulfonamide Derivatives, Zolpidem, Penicillins, Shellfish Allergy, Tape     Isolation Precautions Info: Isolation:  Contact precautions     Current Medications (07/27/2015):  This is the current hospital active medication list Current Facility-Administered Medications  Medication Dose Route Frequency Provider Last Rate Last Dose  . 0.9 %  sodium chloride infusion  250 mL Intravenous PRN Ivor Costa, MD      . acetaminophen (TYLENOL) tablet 500 mg  500 mg Oral Daily PRN Ivor Costa, MD      . collagenase (SANTYL) ointment 1 application  1 application Topical Daily Ivor Costa, MD   1 application at 86/57/84 1033  . diltiazem (CARDIZEM) tablet 60 mg  60 mg Oral  BID Ivor Costa, MD   60 mg at 07/27/15 1024  . diphenhydrAMINE (BENADRYL) capsule 25 mg  25 mg Oral Q6H PRN Ivor Costa, MD      . enoxaparin (LOVENOX) injection 60 mg  1 mg/kg Subcutaneous QHS Ivor Costa, MD   60 mg at 07/26/15 2159  . feeding supplement (PRO-STAT SUGAR FREE 64) liquid 30 mL  30 mL Oral BID Ivor Costa, MD   30 mL at 07/27/15 1022  . furosemide (LASIX) tablet 40 mg  40 mg Oral Daily Geradine Girt, DO   40 mg at 07/27/15 1024  . gabapentin (NEURONTIN) capsule 100 mg  100 mg Oral QHS Ivor Costa, MD   100 mg  at 07/26/15 2200  . lidocaine-prilocaine (EMLA) cream 1 application  1 application Topical PRN Ivor Costa, MD      . loperamide (IMODIUM) capsule 2 mg  2 mg Oral Q6H PRN Ivor Costa, MD      . metoprolol tartrate (LOPRESSOR) tablet 12.5 mg  12.5 mg Oral BID Ivor Costa, MD   12.5 mg at 07/27/15 1024  . ondansetron (ZOFRAN) injection 4 mg  4 mg Intravenous Q6H PRN Ivor Costa, MD      . pantoprazole (PROTONIX) EC tablet 40 mg  40 mg Oral QHS Ivor Costa, MD   40 mg at 07/26/15 2200  . predniSONE (DELTASONE) tablet 10 mg  10 mg Oral Q breakfast Ivor Costa, MD   10 mg at 07/27/15 0744  . sodium chloride flush (NS) 0.9 % injection 10-40 mL  10-40 mL Intracatheter PRN Geradine Girt, DO   10 mL at 07/27/15 0457  . sodium chloride flush (NS) 0.9 % injection 3 mL  3 mL Intravenous Q12H Ivor Costa, MD   3 mL at 07/26/15 2200  . sodium chloride flush (NS) 0.9 % injection 3 mL  3 mL Intravenous PRN Ivor Costa, MD         Discharge Medications: Please see discharge summary for a list of discharge medications.  Relevant Imaging Results:  Relevant Lab Results:   Additional Information SS#: [id number]  Standley Brooking, LCSW

## 2015-07-27 NOTE — Care Management Note (Signed)
Case Management Note  Patient Details  Name: [name] MRN: [id number] Date of Birth: 05/10/1934  Subjective/Objective:                    Action/Plan:d/c SNF   Expected Discharge Date:   (unknown)               Expected Discharge Plan:  [place]  In-House Referral:     Discharge planning Services  CM Consult  Post Acute Care Choice:  [place] ([place]) Choice offered to:     DME Arranged:    DME Agency:     HH Arranged:    [place] Agency:     Status of Service:  Completed, signed off  Medicare Important Message Given:    Date Medicare IM Given:    Medicare IM give by:    Date Additional Medicare IM Given:    Additional Medicare Important Message give by:     If discussed at [place] of Stay Meetings, dates discussed:    Additional Comments:  Dessa Phi, RN 07/27/2015, 12:44 PM

## 2015-07-27 NOTE — Clinical Social Work Placement (Signed)
Patient has a bed at [facility]. CSW has completed FL2 & will continue to follow and assist with discharge when ready.    Raynaldo Opitz, [facility] Clinical Social Worker cell #: [phone]       CLINICAL SOCIAL WORK PLACEMENT  NOTE  Date:  07/27/2015  Patient Details  Name: [name] MRN: [id number] Date of Birth: 1934-02-26  Clinical Social Work is seeking post-discharge placement for this patient at the [place] level of care (*CSW will initial, date and re-position this form in  chart as items are completed):  Yes   Patient/family provided with [facility] Work Department's list of facilities offering this level of care within the geographic area requested by the patient (or if unable, by the patient's family).  Yes   Patient/family informed of their freedom to choose among providers that offer the needed level of care, that participate in Medicare, Medicaid or managed care program needed by the patient, have an available bed and are willing to accept the patient.  Yes   Patient/family informed of [facility]'s ownership interest in [facility] and [facility], as well as of the fact that they are under no obligation to receive care at these facilities.  PASRR submitted to EDS on 07/27/15     PASRR number received on 07/27/15     Existing PASRR number confirmed on       FL2 transmitted to all facilities in geographic area requested by pt/family on 07/27/15     FL2 transmitted to all facilities within larger geographic area on       Patient informed that his/her managed care company has contracts with or will negotiate with certain facilities, including the following:        Yes   Patient/family informed of bed offers received.  Patient chooses bed at [facility]     Physician recommends and patient chooses bed at      Patient to be transferred to [facility]  on  .  Patient to be transferred to facility by       Patient family notified on   of transfer.  Name of family member notified:        PHYSICIAN       Additional Comment:    _______________________________________________ Standley Brooking, LCSW 07/27/2015, 10:54 AM

## 2015-07-27 NOTE — Discharge Summary (Addendum)
Physician Discharge Summary  [name] HYI:[id number] DOB: 04/13/1934 DOA: 07/25/2015  PCP: Wende Neighbors, MD  Admit date: 07/25/2015 Discharge date: 07/27/2015  Time spent: 35 minutes  Recommendations for Outpatient Follow-up:  1. Wound care: Santyl ointment to sacrum; will continue present plan of care. Air mattress to reduce pressure. Float heels to reduce pressure. Vaseline gauze to skin tears with tegaderm to promote healing. Foam dressings to protect right heel and right hip. 2. Daily weights- see instructions below 3. Cbc, bmp 1 week 4. PRN O2 if needed   Discharge Diagnoses:  Principal Problem:   Acute on chronic respiratory failure (HCC) Active Problems:   Lung cancer stage IV with Metastatic squamous cell carcinoma to bone (HCC)   Acute renal failure superimposed on stage 3 chronic kidney disease (HCC)   Anemia of chronic disease   Malnutrition of moderate degree   Right leg DVT (HCC)   Atrial fibrillation (HCC)   Acute on chronic diastolic (congestive) heart failure (HCC)   SOB (shortness of breath)   Discharge Condition: improved  Diet recommendation: cardiac  Filed Weights   07/25/15 1907 07/26/15 1509 07/27/15 0538  Weight: 63.504 kg (140 lb) 60.1 kg (132 lb 7.9 oz) 60.9 kg (134 lb 4.2 oz)    History of present illness:  [name] is a 82 y.o. female with PMH of hypertension, chronic kidney disease stage IV, stage IV non-small cell lung cancer with metastasis to the lumbar spine, and pressure sores, hypertension, CAD, diastolic congestive heart failure, right leg DVT on lovenox, atrial fibrillation, right leg paralysis, who presents with SOB.  Patient reports that she has worsening shortness of breath today. No cough, chest pain, fever or chills. She has worsening leg edema. She states that she stopped taking Lasix 3 days ago. Patient does not have abdominal pain, diarrhea, symptoms of UTI. She has right leg paralysis, but no new unilateral  weakness.  In ED, patient was found to have BNP 1017.4, negative troponin, WBC 5.4, temperature normal, no tachycardia, stable renal function. CXR showed emphysema, vascular congestion, persistent bibasilar collapse/consolidation, progressed on the left, with bilateral pleural effusions. Patient's admitted to inpatient for further evaluation treatment.  Hospital Course:  Acute on chronic respiratory failure [facility]): most likely due to acute on chronic diastolic (congestive) heart failure.  - stopped taking Lasix in the past 3 days -elevated BNP -down 3L- off O2 -will not repeat 2d echo since pt had one recently on 06/18/15, which showed EF 50-55 percent with grade 1 diastolic dysfunction. -will continue home metoprolol -Daily weights -Low salt diet -No lisinopril due to CKD-IV.  Pressure wounds: Sacrum, Stage 4: 5cm x 3.5cm x 0.3cm with undermining at 12 o'clock measuring 0.2cm. Right hip with previously noted stage 3 pressure injury which has almost healed; /5X.5X.1cm pink and dry Right heel with Stage 1 Pressure Injury measuring 3cm round with no depth.  Skin tears on right LE measures 1.5cm x 1.5cm x 0.1cm, on right upper arm measures 2cm x 2cm x 0.1cm, on left arm 1X1X.1cm, 2X,.5X.1cm. All areas are red and moist, partial thickness with small amt pink drainage Wound NOM:VEHMCN is 80% red and moist, interspersed with 20% yellow. Leave foley for healing  Stage 4 non-small cell lung cancer: Metastasis to L5. Managed by Dr. Julien Nordmann -palliative per notes -on prednisone 10 mg daily  Anemia of chronic disease: Hgb 9.7 on admission, consistent with her baseline  - Monitorby CBC  DVT, right leg: on Lovenox at home -continue lovenox '1mg'$ /kg  Atrial Fibrillation: CHA2DS2-VASc Score is 5, needs AC. Patient is on lovenox for both DVT and afib at home. Heart rate is well controlled. -continue lovenox and metoprolol and cardizem   Procedures:    Consultations:    Discharge  Exam: Filed Vitals:   07/27/15 0538 07/27/15 1000  BP: 139/58 130/61  Pulse: 69 76  Temp: 98 F (36.7 C)   Resp: 20     General: awake, NAD- Off O2   Discharge Instructions   Discharge Instructions    (HEART FAILURE PATIENTS) Call MD:  Anytime you have any of the following symptoms: 1) 3 pound weight gain in 24 hours or 5 pounds in 1 week 2) shortness of breath, with or without a dry hacking cough 3) swelling in the hands, feet or stomach 4) if you have to sleep on extra pillows at night in order to breathe.    Complete by:  As directed      Diet - low sodium heart healthy    Complete by:  As directed      Discharge instructions    Complete by:  As directed   Foley for wound healing     Increase activity slowly    Complete by:  As directed           Current Discharge Medication List    CONTINUE these medications which have CHANGED   Details  furosemide (LASIX) 40 MG tablet Take 1 tablet (40 mg total) by mouth daily. Qty: 30 tablet      CONTINUE these medications which have NOT CHANGED   Details  diltiazem (CARDIZEM) 60 MG tablet Take 1 tablet (60 mg total) by mouth 2 (two) times daily. Qty: 60 tablet, Refills: 0    enoxaparin (LOVENOX) 60 MG/0.6ML injection Inject 0.6 mLs (60 mg total) into the skin daily. Qty: 30 Syringe, Refills: 0    gabapentin (NEURONTIN) 100 MG capsule Take 1 capsule (100 mg total) by mouth at bedtime. Qty: 30 capsule, Refills: 0    lidocaine-prilocaine (EMLA) cream Apply 1 application topically as needed. Qty: 30 g, Refills: 2    metoprolol tartrate (LOPRESSOR) 25 MG tablet Take 0.5 tablets (12.5 mg total) by mouth 2 (two) times daily. Qty: 60 tablet, Refills: 0    pantoprazole (PROTONIX) 40 MG tablet Take 40 mg by mouth at bedtime.    predniSONE (DELTASONE) 10 MG tablet Take 1 tablet (10 mg total) by mouth daily with breakfast. Qty: 30 tablet, Refills: 0    SANTYL ointment Apply 1 application topically at bedtime as needed. For bed  sores    acetaminophen (TYLENOL) 500 MG tablet Take 500 mg by mouth daily as needed for mild pain.    Amino Acids-Protein Hydrolys (FEEDING SUPPLEMENT, PRO-STAT SUGAR FREE 64,) LIQD Take 30 mLs by mouth 2 (two) times daily. Qty: 900 mL, Refills: 0    diphenhydrAMINE (BENADRYL) 25 mg capsule Take 1 capsule (25 mg total) by mouth every 6 (six) hours as needed. Qty: 30 capsule, Refills: 0    feeding supplement, ENSURE ENLIVE, (ENSURE ENLIVE) LIQD Take 237 mLs by mouth 2 (two) times daily between meals. Qty: 237 mL, Refills: 12    loperamide (IMODIUM) 2 MG capsule Take 1 capsule (2 mg total) by mouth every 6 (six) hours as needed for diarrhea or loose stools. Qty: 30 capsule, Refills: 0      STOP taking these medications     famotidine (PEPCID) 20 MG tablet      fosfomycin (MONUROL) 3 g PACK  Allergies  Allergen Reactions  . Amlodipine Swelling  . Ciprofloxacin Hives and Other (See Comments)    REACTION: weakness  . Mirtazapine Other (See Comments)    Hallucinations and nightmares, verbally aggressive   . Statins Other (See Comments)  . Aspirin Swelling    Mouth swelling, tongue Patient reports that she tolerates ibuprofen without problem  . Cephalexin Hives  . Hydralazine Nausea And Vomiting  . Iron Nausea And Vomiting  . Ambien [Zolpidem Tartrate] Other (See Comments)    confusion  . Lorazepam Other (See Comments)    Hallucinations, verbally aggressive  . Sulfonamide Derivatives Hives  . Zolpidem     Confusion  . Penicillins Other (See Comments)    Has patient had a PCN reaction causing immediate rash, facial/tongue/throat swelling, SOB or lightheadedness with hypotension: Yes Has patient had a PCN reaction causing severe rash involving mucus membranes or skin necrosis: Yes Has patient had a PCN reaction that required hospitalization No Has patient had a PCN reaction occurring within the last 10 years: No If all of the above answers are "NO", then may proceed  with Cephalosporin use.   . Shellfish Allergy Hives and Rash  . Tape Rash      The results of significant diagnostics from this hospitalization (including imaging, microbiology, ancillary and laboratory) are listed below for reference.    Significant Diagnostic Studies: Dg Chest Port 1 View  07/25/2015  CLINICAL DATA:  Lung cancer with shortness of breath. EXAM: PORTABLE CHEST 1 VIEW COMPARISON:  06/16/2015 FINDINGS: 2000 hours. Hyperexpansion is consistent with emphysema. Interstitial markings are diffusely coarsened with chronic features. Vascular congestion with probable interstitial edema, progressed in the interval. There is bilateral lower lung airspace disease with pleural effusions, progressed on the left. 1 cm spiculated nodule seen in the left upper lung on the previous study is less evident today. The cardio pericardial silhouette is enlarged. Right Port-A-Cath remains in place with tip overlying the mid SVC level. Bones are diffusely demineralized. Telemetry leads overlie the chest. IMPRESSION: Hyperexpansion consistent with emphysema. Interval development of vascular congestion with increased interstitial disease, likely reflecting pulmonary edema. Persistent bibasilar collapse/consolidation, progressed on the left, with bilateral pleural effusions. Electronically Signed   By: Misty Stanley M.D.   On: 07/25/2015 20:14    Microbiology: Recent Results (from the past 240 hour(s))  Urine culture     Status: None   Collection Time: 07/17/15  5:53 PM  Result Value Ref Range Status   Specimen Description URINE, CATHETERIZED  Final   Special Requests NONE  Final   Culture   Final    >=100,000 COLONIES/mL ACINETOBACTER CALCOACETICUS/BAUMANNII COMPLEX MULTI-DRUG RESISTANT ORGANISM RESULT CALLED TO, READ BACK BY AND VERIFIED WITH: DR PATEL 07/21/15 @ 1100 M VESTAL Performed at [facility]    Report Status 07/21/2015 FINAL  Final   Organism ID, Bacteria ACINETOBACTER  CALCOACETICUS/BAUMANNII COMPLEX  Final      Susceptibility   Acinetobacter calcoaceticus/baumannii complex - MIC*    CEFTAZIDIME >=64 RESISTANT Resistant     CEFTRIAXONE >=64 RESISTANT Resistant     CIPROFLOXACIN >=4 RESISTANT Resistant     GENTAMICIN >=16 RESISTANT Resistant     IMIPENEM >=16 RESISTANT Resistant     PIP/TAZO >=128 RESISTANT Resistant     TRIMETH/SULFA >=320 RESISTANT Resistant     AMPICILLIN/SULBACTAM 16 INTERMEDIATE Intermediate     * >=100,000 COLONIES/mL ACINETOBACTER CALCOACETICUS/BAUMANNII COMPLEX  Culture, blood (routine x 2)     Status: None   Collection Time: 07/17/15  6:00  PM  Result Value Ref Range Status   Specimen Description BLOOD LEFT ANTECUBITAL  Final   Special Requests BOTTLES DRAWN AEROBIC AND ANAEROBIC 5CC  Final   Culture   Final    NO GROWTH 5 DAYS Performed at [facility]    Report Status 07/23/2015 FINAL  Final  Culture, blood (routine x 2)     Status: None   Collection Time: 07/17/15 11:24 PM  Result Value Ref Range Status   Specimen Description BLOOD RIGHT ARM  Final   Special Requests BOTTLES DRAWN AEROBIC AND ANAEROBIC [place]  Final   Culture   Final    NO GROWTH 5 DAYS Performed at [facility]    Report Status 07/23/2015 FINAL  Final  MRSA PCR Screening     Status: None   Collection Time: 07/17/15 11:25 PM  Result Value Ref Range Status   MRSA by PCR NEGATIVE NEGATIVE Final    Comment:        The GeneXpert MRSA Assay (FDA approved for NASAL specimens only), is one component of a comprehensive MRSA colonization surveillance program. It is not intended to diagnose MRSA infection nor to guide or monitor treatment for MRSA infections.      Labs: Basic Metabolic Panel:  Recent Labs Lab 07/21/15 1235 07/22/15 0352 07/25/15 2030 07/26/15 0514 07/27/15 0457  NA 143 144 145 145 144  K 4.6 4.7 4.7 4.2 4.2  CL 112* 116* 116* 116* 113*  CO2 21* 22 21* 21* 23  GLUCOSE 132* 90 134* 82 77  BUN 64* 60* 46* 45*  48*  CREATININE 2.04* 1.83* 1.87* 1.76* 1.99*  CALCIUM 7.8* 7.7* 8.0* 7.9* 7.8*   Liver Function Tests:  Recent Labs Lab 07/21/15 0234 07/25/15 2030  AST 37 42*  ALT 58* 52  ALKPHOS 133* 151*  BILITOT 0.3 0.4  PROT 4.8* 5.3*  ALBUMIN 2.0* 2.4*   No results for input(s): LIPASE, AMYLASE in the last 168 hours. No results for input(s): AMMONIA in the last 168 hours. CBC:  Recent Labs Lab 07/25/15 2030 07/27/15 0457  WBC 5.4 4.1  NEUTROABS 5.0  --   HGB 9.7* 8.3*  HCT 31.2* 27.2*  MCV 94.3 95.1  PLT 186 145*   Cardiac Enzymes:  Recent Labs Lab 07/25/15 2258 07/26/15 0514 07/26/15 1235  TROPONINI 0.05* 0.05* 0.05*   BNP: BNP (last 3 results)  Recent Labs  07/25/15 2030  BNP 1017.4*    ProBNP (last 3 results) No results for input(s): PROBNP in the last 8760 hours.  CBG:  Recent Labs Lab 07/21/15 0710 07/22/15 0749  GLUCAP 111* 73       Signed:  Temitayo Covalt U Rafel Garde DO.  [facility] 07/27/2015, 12:07 PM

## 2015-07-27 NOTE — Evaluation (Signed)
Physical Therapy Evaluation Patient Details Name: [name] MRN: [id number] DOB: [birth year] Today's Date: 07/27/2015   History of Present Illness   82 yo female admitted for acute on chronic respiratory failure most likely due to acute on chronic diastolic (congestive) heart failure  PMHx: stage IV NSCLC initially diagnosed in 2007, with disease recurrent in 11/2014, s/p XRT to right paraspinous mass with osseous invasion at L5, HTN, CAD and stent in 6294, chronic systolic CHF, multiple pressure ulcers (refer to [facility] RN note for details)   Clinical Impression  Pt admitted with above diagnosis. Pt currently with functional limitations due to the deficits listed below (see PT Problem List).  Pt will benefit from skilled PT to increase their independence and safety with mobility to allow discharge to the venue listed below.   Pt agreeable to short term SNF at this time.     Follow Up Recommendations SNF;Supervision/Assistance - 24 hour    Equipment Recommendations  Wheelchair (measurements PT);Wheelchair cushion (measurements PT) (has transport chair, due to hx of pressure ulcers would need pressure relief cushion)    Recommendations for Other Services       Precautions / Restrictions Precautions Precautions: Fall Precaution Comments: hx of pressure ulcers, weeping LEs      Mobility  Bed Mobility Overal bed mobility: Needs Assistance Bed Mobility: Sidelying to Sit;Sit to Sidelying   Sidelying to sit: Mod assist     Sit to sidelying: Mod assist General bed mobility comments: pt assisted with LEs and trunk upright and then LEs onto bed  Transfers Overall transfer level:  (pt did not wish to attempt today)                  Ambulation/Gait                Stairs            Wheelchair Mobility    Modified Rankin (Stroke Patients Only)       Balance Overall balance assessment: Needs assistance Sitting-balance support: Bilateral upper extremity  supported;Feet supported Sitting balance-Leahy Scale: Poor Sitting balance - Comments: pt able to sit EOB for approx 5 min, requires UE support, external support for any challenge to balance (such as bed air chambers changing) Postural control: Posterior lean                                   Pertinent Vitals/Pain Pain Assessment: No/denies pain Pain Intervention(s): Repositioned    Home Living Family/patient expects to be discharged to:: Private residence Living Arrangements: Other (Comment);Alone (son in law comes daily, pt also has HHA) Available Help at Discharge: Family;Friend(s);Personal care attendant   Home Access: Stairs to enter   Entrance Stairs-Number of Steps: 6 Home Layout: One level Home Equipment: [place] - single point;Walker - standard;Transport chair;Bedside commode;Hospital bed      Prior Function Level of Independence: Needs assistance   Gait / Transfers Assistance Needed: pt reports she requires assist with stand pivot transfers to transport chair however most of the time remains in bed  ADL's / Homemaking Assistance Needed: daughter gives her sponge baths in bed, changes diaper, and gets her dressed        Hand Dominance   Dominant Hand: Right    Extremity/Trunk Assessment   Upper Extremity Assessment: Generalized weakness (grossly at least 3/5)           Lower Extremity Assessment: Generalized weakness;RLE deficits/detail RLE  Deficits / Details: ankle plantar flexor contracture, no active ankle DF, knee flexion AROM 60* in supine, hip grossly 2+/5 LLE Deficits / Details: grossly at least 3/5      Communication   Communication: No difficulties  Cognition Arousal/Alertness: Awake/alert Behavior During Therapy: WFL for tasks assessed/performed Overall Cognitive Status: Within Functional Limits for tasks assessed                      General Comments      Exercises        Assessment/Plan    PT Assessment  Patient needs continued PT services  PT Diagnosis Generalized weakness   PT Problem List Decreased activity tolerance;Decreased mobility;Decreased strength;Decreased balance;Decreased range of motion;Decreased skin integrity  PT Treatment Interventions DME instruction;Functional mobility training;Therapeutic activities;Therapeutic exercise;Patient/family education;Balance training   PT Goals (Current goals can be found in the Care Plan section) Acute Rehab PT Goals PT Goal Formulation: With patient Time For Goal Achievement: 08/03/15 Potential to Achieve Goals: Good    Frequency Min 2X/week   Barriers to discharge        Co-evaluation               End of Session   Activity Tolerance: Patient tolerated treatment well;No increased pain Patient left: with call bell/phone within reach;in bed;with bed alarm set Nurse Communication: Mobility status         Time: 0233-4356 PT Time Calculation (min) (ACUTE ONLY): 19 min   Charges:   PT Evaluation $PT Eval Moderate Complexity: 1 Procedure     PT G Codes:        Frederico Gerling,KATHrine E 07/27/2015, 11:37 AM Carmelia Bake, PT, DPT 07/27/2015 Pager: [phone]

## 2015-07-28 DIAGNOSIS — E876 Hypokalemia: Secondary | ICD-10-CM | POA: Diagnosis not present

## 2015-07-28 DIAGNOSIS — D649 Anemia, unspecified: Secondary | ICD-10-CM | POA: Diagnosis not present

## 2015-07-28 DIAGNOSIS — I9589 Other hypotension: Secondary | ICD-10-CM | POA: Diagnosis not present

## 2015-07-28 DIAGNOSIS — A4189 Other specified sepsis: Secondary | ICD-10-CM | POA: Diagnosis not present

## 2015-07-28 DIAGNOSIS — L89813 Pressure ulcer of head, stage 3: Secondary | ICD-10-CM | POA: Diagnosis not present

## 2015-07-31 DIAGNOSIS — R319 Hematuria, unspecified: Secondary | ICD-10-CM | POA: Diagnosis not present

## 2015-08-02 DIAGNOSIS — R319 Hematuria, unspecified: Secondary | ICD-10-CM | POA: Diagnosis not present

## 2015-08-04 DIAGNOSIS — D0220 Carcinoma in situ of unspecified bronchus and lung: Secondary | ICD-10-CM | POA: Diagnosis not present

## 2015-08-04 DIAGNOSIS — N390 Urinary tract infection, site not specified: Secondary | ICD-10-CM | POA: Diagnosis not present

## 2015-08-04 DIAGNOSIS — L89153 Pressure ulcer of sacral region, stage 3: Secondary | ICD-10-CM | POA: Diagnosis not present

## 2015-08-04 DIAGNOSIS — E876 Hypokalemia: Secondary | ICD-10-CM | POA: Diagnosis not present

## 2015-08-04 DIAGNOSIS — I8291 Chronic embolism and thrombosis of unspecified vein: Secondary | ICD-10-CM | POA: Diagnosis not present

## 2015-08-04 DIAGNOSIS — I509 Heart failure, unspecified: Secondary | ICD-10-CM | POA: Diagnosis not present

## 2015-08-04 DIAGNOSIS — L89813 Pressure ulcer of head, stage 3: Secondary | ICD-10-CM | POA: Diagnosis not present

## 2015-08-04 DIAGNOSIS — A4189 Other specified sepsis: Secondary | ICD-10-CM | POA: Diagnosis not present

## 2015-08-04 DIAGNOSIS — D649 Anemia, unspecified: Secondary | ICD-10-CM | POA: Diagnosis not present

## 2015-08-04 DIAGNOSIS — I9589 Other hypotension: Secondary | ICD-10-CM | POA: Diagnosis not present

## 2015-08-11 ENCOUNTER — Encounter (HOSPITAL_COMMUNITY)
Admission: RE | Admit: 2015-08-11 | Discharge: 2015-08-11 | Disposition: A | Discharge status: Home / Self Care | Payer: Medicare Other | Source: Ambulatory Visit | Attending: Internal Medicine | Admitting: Internal Medicine

## 2015-08-11 DIAGNOSIS — C7951 Secondary malignant neoplasm of bone: Secondary | ICD-10-CM | POA: Diagnosis not present

## 2015-08-11 DIAGNOSIS — N390 Urinary tract infection, site not specified: Secondary | ICD-10-CM | POA: Diagnosis not present

## 2015-08-11 MED ORDER — SODIUM CHLORIDE 0.9 % IV SOLN
500.0000 mg | Freq: Once | INTRAVENOUS | Status: AC
  Administered 2015-08-11: 500 mg via INTRAVENOUS
  Filled 2015-08-11: qty 500

## 2015-08-11 MED ORDER — SODIUM CHLORIDE 0.9 % IV SOLN
Freq: Once | INTRAVENOUS | Status: AC
  Administered 2015-08-11: 10 mL/h via INTRAVENOUS

## 2015-08-11 MED ORDER — HEPARIN SOD (PORK) LOCK FLUSH 100 UNIT/ML IV SOLN
500.0000 [IU] | Freq: Once | INTRAVENOUS | Status: AC
  Administered 2015-08-11: 500 [IU] via INTRAVENOUS
  Filled 2015-08-11: qty 5

## 2015-08-12 DIAGNOSIS — R222 Localized swelling, mass and lump, trunk: Secondary | ICD-10-CM | POA: Diagnosis not present

## 2015-08-12 DIAGNOSIS — N184 Chronic kidney disease, stage 4 (severe): Secondary | ICD-10-CM | POA: Diagnosis not present

## 2015-08-12 DIAGNOSIS — I5032 Chronic diastolic (congestive) heart failure: Secondary | ICD-10-CM | POA: Diagnosis not present

## 2015-08-12 DIAGNOSIS — D0220 Carcinoma in situ of unspecified bronchus and lung: Secondary | ICD-10-CM | POA: Diagnosis not present

## 2015-08-12 DIAGNOSIS — C7951 Secondary malignant neoplasm of bone: Secondary | ICD-10-CM | POA: Diagnosis not present

## 2015-08-12 DIAGNOSIS — I82409 Acute embolism and thrombosis of unspecified deep veins of unspecified lower extremity: Secondary | ICD-10-CM | POA: Diagnosis not present

## 2015-08-12 DIAGNOSIS — I2510 Atherosclerotic heart disease of native coronary artery without angina pectoris: Secondary | ICD-10-CM | POA: Diagnosis not present

## 2015-08-12 DIAGNOSIS — J9611 Chronic respiratory failure with hypoxia: Secondary | ICD-10-CM | POA: Diagnosis not present

## 2015-08-12 DIAGNOSIS — I5033 Acute on chronic diastolic (congestive) heart failure: Secondary | ICD-10-CM | POA: Diagnosis not present

## 2015-08-12 DIAGNOSIS — D649 Anemia, unspecified: Secondary | ICD-10-CM | POA: Diagnosis not present

## 2015-08-12 DIAGNOSIS — N183 Chronic kidney disease, stage 3 (moderate): Secondary | ICD-10-CM | POA: Diagnosis not present

## 2015-08-12 DIAGNOSIS — N390 Urinary tract infection, site not specified: Secondary | ICD-10-CM | POA: Diagnosis not present

## 2015-08-12 DIAGNOSIS — E440 Moderate protein-calorie malnutrition: Secondary | ICD-10-CM | POA: Diagnosis not present

## 2015-08-12 DIAGNOSIS — L89154 Pressure ulcer of sacral region, stage 4: Secondary | ICD-10-CM | POA: Diagnosis not present

## 2015-08-12 DIAGNOSIS — E46 Unspecified protein-calorie malnutrition: Secondary | ICD-10-CM | POA: Diagnosis not present

## 2015-08-12 DIAGNOSIS — I82401 Acute embolism and thrombosis of unspecified deep veins of right lower extremity: Secondary | ICD-10-CM | POA: Diagnosis not present

## 2015-08-12 DIAGNOSIS — M7061 Trochanteric bursitis, right hip: Secondary | ICD-10-CM | POA: Diagnosis not present

## 2015-08-12 DIAGNOSIS — C3490 Malignant neoplasm of unspecified part of unspecified bronchus or lung: Secondary | ICD-10-CM | POA: Diagnosis not present

## 2015-08-12 DIAGNOSIS — L8990 Pressure ulcer of unspecified site, unspecified stage: Secondary | ICD-10-CM | POA: Diagnosis not present

## 2015-08-12 DIAGNOSIS — G061 Intraspinal abscess and granuloma: Secondary | ICD-10-CM | POA: Diagnosis not present

## 2015-08-12 DIAGNOSIS — I130 Hypertensive heart and chronic kidney disease with heart failure and stage 1 through stage 4 chronic kidney disease, or unspecified chronic kidney disease: Secondary | ICD-10-CM | POA: Diagnosis not present

## 2015-08-12 DIAGNOSIS — I4891 Unspecified atrial fibrillation: Secondary | ICD-10-CM | POA: Diagnosis not present

## 2015-08-12 DIAGNOSIS — R000 Tachycardia, unspecified: Secondary | ICD-10-CM | POA: Diagnosis not present

## 2015-08-14 DIAGNOSIS — G061 Intraspinal abscess and granuloma: Secondary | ICD-10-CM | POA: Diagnosis not present

## 2015-08-14 DIAGNOSIS — I82401 Acute embolism and thrombosis of unspecified deep veins of right lower extremity: Secondary | ICD-10-CM | POA: Diagnosis not present

## 2015-08-14 DIAGNOSIS — E440 Moderate protein-calorie malnutrition: Secondary | ICD-10-CM | POA: Diagnosis not present

## 2015-08-14 DIAGNOSIS — C7951 Secondary malignant neoplasm of bone: Secondary | ICD-10-CM | POA: Diagnosis not present

## 2015-08-14 DIAGNOSIS — C3490 Malignant neoplasm of unspecified part of unspecified bronchus or lung: Secondary | ICD-10-CM | POA: Diagnosis not present

## 2015-08-14 DIAGNOSIS — I4891 Unspecified atrial fibrillation: Secondary | ICD-10-CM | POA: Diagnosis not present

## 2015-08-14 DIAGNOSIS — I2510 Atherosclerotic heart disease of native coronary artery without angina pectoris: Secondary | ICD-10-CM | POA: Diagnosis not present

## 2015-08-14 DIAGNOSIS — J9611 Chronic respiratory failure with hypoxia: Secondary | ICD-10-CM | POA: Diagnosis not present

## 2015-08-14 DIAGNOSIS — N184 Chronic kidney disease, stage 4 (severe): Secondary | ICD-10-CM | POA: Diagnosis not present

## 2015-08-14 DIAGNOSIS — I130 Hypertensive heart and chronic kidney disease with heart failure and stage 1 through stage 4 chronic kidney disease, or unspecified chronic kidney disease: Secondary | ICD-10-CM | POA: Diagnosis not present

## 2015-08-14 DIAGNOSIS — I5032 Chronic diastolic (congestive) heart failure: Secondary | ICD-10-CM | POA: Diagnosis not present

## 2015-08-14 DIAGNOSIS — L89154 Pressure ulcer of sacral region, stage 4: Secondary | ICD-10-CM | POA: Diagnosis not present

## 2015-08-14 DIAGNOSIS — D649 Anemia, unspecified: Secondary | ICD-10-CM | POA: Diagnosis not present

## 2015-08-16 DIAGNOSIS — I4891 Unspecified atrial fibrillation: Secondary | ICD-10-CM | POA: Diagnosis not present

## 2015-08-16 DIAGNOSIS — C3490 Malignant neoplasm of unspecified part of unspecified bronchus or lung: Secondary | ICD-10-CM | POA: Diagnosis not present

## 2015-08-16 DIAGNOSIS — I130 Hypertensive heart and chronic kidney disease with heart failure and stage 1 through stage 4 chronic kidney disease, or unspecified chronic kidney disease: Secondary | ICD-10-CM | POA: Diagnosis not present

## 2015-08-16 DIAGNOSIS — D649 Anemia, unspecified: Secondary | ICD-10-CM | POA: Diagnosis not present

## 2015-08-16 DIAGNOSIS — I2510 Atherosclerotic heart disease of native coronary artery without angina pectoris: Secondary | ICD-10-CM | POA: Diagnosis not present

## 2015-08-16 DIAGNOSIS — I5032 Chronic diastolic (congestive) heart failure: Secondary | ICD-10-CM | POA: Diagnosis not present

## 2015-08-16 DIAGNOSIS — L89154 Pressure ulcer of sacral region, stage 4: Secondary | ICD-10-CM | POA: Diagnosis not present

## 2015-08-16 DIAGNOSIS — C7951 Secondary malignant neoplasm of bone: Secondary | ICD-10-CM | POA: Diagnosis not present

## 2015-08-16 DIAGNOSIS — J9611 Chronic respiratory failure with hypoxia: Secondary | ICD-10-CM | POA: Diagnosis not present

## 2015-08-16 DIAGNOSIS — N184 Chronic kidney disease, stage 4 (severe): Secondary | ICD-10-CM | POA: Diagnosis not present

## 2015-08-16 DIAGNOSIS — E440 Moderate protein-calorie malnutrition: Secondary | ICD-10-CM | POA: Diagnosis not present

## 2015-08-16 DIAGNOSIS — I82401 Acute embolism and thrombosis of unspecified deep veins of right lower extremity: Secondary | ICD-10-CM | POA: Diagnosis not present

## 2015-08-16 DIAGNOSIS — G061 Intraspinal abscess and granuloma: Secondary | ICD-10-CM | POA: Diagnosis not present

## 2015-08-18 DIAGNOSIS — L89154 Pressure ulcer of sacral region, stage 4: Secondary | ICD-10-CM | POA: Diagnosis not present

## 2015-08-18 DIAGNOSIS — G061 Intraspinal abscess and granuloma: Secondary | ICD-10-CM | POA: Diagnosis not present

## 2015-08-18 DIAGNOSIS — I82401 Acute embolism and thrombosis of unspecified deep veins of right lower extremity: Secondary | ICD-10-CM | POA: Diagnosis not present

## 2015-08-18 DIAGNOSIS — C7951 Secondary malignant neoplasm of bone: Secondary | ICD-10-CM | POA: Diagnosis not present

## 2015-08-18 DIAGNOSIS — J9611 Chronic respiratory failure with hypoxia: Secondary | ICD-10-CM | POA: Diagnosis not present

## 2015-08-18 DIAGNOSIS — C3490 Malignant neoplasm of unspecified part of unspecified bronchus or lung: Secondary | ICD-10-CM | POA: Diagnosis not present

## 2015-08-18 DIAGNOSIS — D649 Anemia, unspecified: Secondary | ICD-10-CM | POA: Diagnosis not present

## 2015-08-18 DIAGNOSIS — N184 Chronic kidney disease, stage 4 (severe): Secondary | ICD-10-CM | POA: Diagnosis not present

## 2015-08-18 DIAGNOSIS — I130 Hypertensive heart and chronic kidney disease with heart failure and stage 1 through stage 4 chronic kidney disease, or unspecified chronic kidney disease: Secondary | ICD-10-CM | POA: Diagnosis not present

## 2015-08-18 DIAGNOSIS — I4891 Unspecified atrial fibrillation: Secondary | ICD-10-CM | POA: Diagnosis not present

## 2015-08-18 DIAGNOSIS — E440 Moderate protein-calorie malnutrition: Secondary | ICD-10-CM | POA: Diagnosis not present

## 2015-08-18 DIAGNOSIS — I2510 Atherosclerotic heart disease of native coronary artery without angina pectoris: Secondary | ICD-10-CM | POA: Diagnosis not present

## 2015-08-18 DIAGNOSIS — I5032 Chronic diastolic (congestive) heart failure: Secondary | ICD-10-CM | POA: Diagnosis not present

## 2015-08-19 DIAGNOSIS — I4891 Unspecified atrial fibrillation: Secondary | ICD-10-CM | POA: Diagnosis not present

## 2015-08-19 DIAGNOSIS — C7951 Secondary malignant neoplasm of bone: Secondary | ICD-10-CM | POA: Diagnosis not present

## 2015-08-19 DIAGNOSIS — N184 Chronic kidney disease, stage 4 (severe): Secondary | ICD-10-CM | POA: Diagnosis not present

## 2015-08-19 DIAGNOSIS — E440 Moderate protein-calorie malnutrition: Secondary | ICD-10-CM | POA: Diagnosis not present

## 2015-08-19 DIAGNOSIS — I5032 Chronic diastolic (congestive) heart failure: Secondary | ICD-10-CM | POA: Diagnosis not present

## 2015-08-19 DIAGNOSIS — C3490 Malignant neoplasm of unspecified part of unspecified bronchus or lung: Secondary | ICD-10-CM | POA: Diagnosis not present

## 2015-08-19 DIAGNOSIS — G061 Intraspinal abscess and granuloma: Secondary | ICD-10-CM | POA: Diagnosis not present

## 2015-08-19 DIAGNOSIS — L89154 Pressure ulcer of sacral region, stage 4: Secondary | ICD-10-CM | POA: Diagnosis not present

## 2015-08-19 DIAGNOSIS — I130 Hypertensive heart and chronic kidney disease with heart failure and stage 1 through stage 4 chronic kidney disease, or unspecified chronic kidney disease: Secondary | ICD-10-CM | POA: Diagnosis not present

## 2015-08-19 DIAGNOSIS — I82401 Acute embolism and thrombosis of unspecified deep veins of right lower extremity: Secondary | ICD-10-CM | POA: Diagnosis not present

## 2015-08-19 DIAGNOSIS — I2510 Atherosclerotic heart disease of native coronary artery without angina pectoris: Secondary | ICD-10-CM | POA: Diagnosis not present

## 2015-08-19 DIAGNOSIS — J9611 Chronic respiratory failure with hypoxia: Secondary | ICD-10-CM | POA: Diagnosis not present

## 2015-08-19 DIAGNOSIS — D649 Anemia, unspecified: Secondary | ICD-10-CM | POA: Diagnosis not present

## 2015-08-21 DIAGNOSIS — D649 Anemia, unspecified: Secondary | ICD-10-CM | POA: Diagnosis not present

## 2015-08-21 DIAGNOSIS — I5032 Chronic diastolic (congestive) heart failure: Secondary | ICD-10-CM | POA: Diagnosis not present

## 2015-08-21 DIAGNOSIS — L8990 Pressure ulcer of unspecified site, unspecified stage: Secondary | ICD-10-CM | POA: Diagnosis not present

## 2015-08-21 DIAGNOSIS — N184 Chronic kidney disease, stage 4 (severe): Secondary | ICD-10-CM | POA: Diagnosis not present

## 2015-08-21 DIAGNOSIS — I4891 Unspecified atrial fibrillation: Secondary | ICD-10-CM | POA: Diagnosis not present

## 2015-08-21 DIAGNOSIS — B964 Proteus (mirabilis) (morganii) as the cause of diseases classified elsewhere: Secondary | ICD-10-CM | POA: Diagnosis not present

## 2015-08-21 DIAGNOSIS — C3490 Malignant neoplasm of unspecified part of unspecified bronchus or lung: Secondary | ICD-10-CM | POA: Diagnosis not present

## 2015-08-21 DIAGNOSIS — E440 Moderate protein-calorie malnutrition: Secondary | ICD-10-CM | POA: Diagnosis not present

## 2015-08-21 DIAGNOSIS — J9611 Chronic respiratory failure with hypoxia: Secondary | ICD-10-CM | POA: Diagnosis not present

## 2015-08-21 DIAGNOSIS — I82401 Acute embolism and thrombosis of unspecified deep veins of right lower extremity: Secondary | ICD-10-CM | POA: Diagnosis not present

## 2015-08-21 DIAGNOSIS — G061 Intraspinal abscess and granuloma: Secondary | ICD-10-CM | POA: Diagnosis not present

## 2015-08-21 DIAGNOSIS — C7951 Secondary malignant neoplasm of bone: Secondary | ICD-10-CM | POA: Diagnosis not present

## 2015-08-21 DIAGNOSIS — I130 Hypertensive heart and chronic kidney disease with heart failure and stage 1 through stage 4 chronic kidney disease, or unspecified chronic kidney disease: Secondary | ICD-10-CM | POA: Diagnosis not present

## 2015-08-21 DIAGNOSIS — L89154 Pressure ulcer of sacral region, stage 4: Secondary | ICD-10-CM | POA: Diagnosis not present

## 2015-08-21 DIAGNOSIS — I2510 Atherosclerotic heart disease of native coronary artery without angina pectoris: Secondary | ICD-10-CM | POA: Diagnosis not present

## 2015-08-22 ENCOUNTER — Telehealth: Payer: Self-pay | Admitting: Internal Medicine

## 2015-08-22 NOTE — Telephone Encounter (Signed)
returned call and s.w. pt dtr and sched appt....ok and aware of d.t

## 2015-08-23 DIAGNOSIS — I4891 Unspecified atrial fibrillation: Secondary | ICD-10-CM | POA: Diagnosis not present

## 2015-08-23 DIAGNOSIS — L89154 Pressure ulcer of sacral region, stage 4: Secondary | ICD-10-CM | POA: Diagnosis not present

## 2015-08-23 DIAGNOSIS — I2510 Atherosclerotic heart disease of native coronary artery without angina pectoris: Secondary | ICD-10-CM | POA: Diagnosis not present

## 2015-08-23 DIAGNOSIS — I82401 Acute embolism and thrombosis of unspecified deep veins of right lower extremity: Secondary | ICD-10-CM | POA: Diagnosis not present

## 2015-08-23 DIAGNOSIS — N184 Chronic kidney disease, stage 4 (severe): Secondary | ICD-10-CM | POA: Diagnosis not present

## 2015-08-23 DIAGNOSIS — M6281 Muscle weakness (generalized): Secondary | ICD-10-CM | POA: Diagnosis not present

## 2015-08-23 DIAGNOSIS — G061 Intraspinal abscess and granuloma: Secondary | ICD-10-CM | POA: Diagnosis not present

## 2015-08-23 DIAGNOSIS — I5033 Acute on chronic diastolic (congestive) heart failure: Secondary | ICD-10-CM | POA: Diagnosis not present

## 2015-08-23 DIAGNOSIS — E440 Moderate protein-calorie malnutrition: Secondary | ICD-10-CM | POA: Diagnosis not present

## 2015-08-23 DIAGNOSIS — C7951 Secondary malignant neoplasm of bone: Secondary | ICD-10-CM | POA: Diagnosis not present

## 2015-08-23 DIAGNOSIS — C3490 Malignant neoplasm of unspecified part of unspecified bronchus or lung: Secondary | ICD-10-CM | POA: Diagnosis not present

## 2015-08-23 DIAGNOSIS — D649 Anemia, unspecified: Secondary | ICD-10-CM | POA: Diagnosis not present

## 2015-08-23 DIAGNOSIS — I130 Hypertensive heart and chronic kidney disease with heart failure and stage 1 through stage 4 chronic kidney disease, or unspecified chronic kidney disease: Secondary | ICD-10-CM | POA: Diagnosis not present

## 2015-08-23 DIAGNOSIS — I5032 Chronic diastolic (congestive) heart failure: Secondary | ICD-10-CM | POA: Diagnosis not present

## 2015-08-23 DIAGNOSIS — J9611 Chronic respiratory failure with hypoxia: Secondary | ICD-10-CM | POA: Diagnosis not present

## 2015-08-25 DIAGNOSIS — C7951 Secondary malignant neoplasm of bone: Secondary | ICD-10-CM | POA: Diagnosis not present

## 2015-08-25 DIAGNOSIS — D649 Anemia, unspecified: Secondary | ICD-10-CM | POA: Diagnosis not present

## 2015-08-25 DIAGNOSIS — I5032 Chronic diastolic (congestive) heart failure: Secondary | ICD-10-CM | POA: Diagnosis not present

## 2015-08-25 DIAGNOSIS — I82401 Acute embolism and thrombosis of unspecified deep veins of right lower extremity: Secondary | ICD-10-CM | POA: Diagnosis not present

## 2015-08-25 DIAGNOSIS — I4891 Unspecified atrial fibrillation: Secondary | ICD-10-CM | POA: Diagnosis not present

## 2015-08-25 DIAGNOSIS — I130 Hypertensive heart and chronic kidney disease with heart failure and stage 1 through stage 4 chronic kidney disease, or unspecified chronic kidney disease: Secondary | ICD-10-CM | POA: Diagnosis not present

## 2015-08-25 DIAGNOSIS — I2510 Atherosclerotic heart disease of native coronary artery without angina pectoris: Secondary | ICD-10-CM | POA: Diagnosis not present

## 2015-08-25 DIAGNOSIS — E440 Moderate protein-calorie malnutrition: Secondary | ICD-10-CM | POA: Diagnosis not present

## 2015-08-25 DIAGNOSIS — N184 Chronic kidney disease, stage 4 (severe): Secondary | ICD-10-CM | POA: Diagnosis not present

## 2015-08-25 DIAGNOSIS — C3490 Malignant neoplasm of unspecified part of unspecified bronchus or lung: Secondary | ICD-10-CM | POA: Diagnosis not present

## 2015-08-25 DIAGNOSIS — L89154 Pressure ulcer of sacral region, stage 4: Secondary | ICD-10-CM | POA: Diagnosis not present

## 2015-08-25 DIAGNOSIS — J9611 Chronic respiratory failure with hypoxia: Secondary | ICD-10-CM | POA: Diagnosis not present

## 2015-08-25 DIAGNOSIS — G061 Intraspinal abscess and granuloma: Secondary | ICD-10-CM | POA: Diagnosis not present

## 2015-08-28 DIAGNOSIS — I2510 Atherosclerotic heart disease of native coronary artery without angina pectoris: Secondary | ICD-10-CM | POA: Diagnosis not present

## 2015-08-28 DIAGNOSIS — N184 Chronic kidney disease, stage 4 (severe): Secondary | ICD-10-CM | POA: Diagnosis not present

## 2015-08-28 DIAGNOSIS — J9611 Chronic respiratory failure with hypoxia: Secondary | ICD-10-CM | POA: Diagnosis not present

## 2015-08-28 DIAGNOSIS — L89154 Pressure ulcer of sacral region, stage 4: Secondary | ICD-10-CM | POA: Diagnosis not present

## 2015-08-28 DIAGNOSIS — C7951 Secondary malignant neoplasm of bone: Secondary | ICD-10-CM | POA: Diagnosis not present

## 2015-08-28 DIAGNOSIS — I130 Hypertensive heart and chronic kidney disease with heart failure and stage 1 through stage 4 chronic kidney disease, or unspecified chronic kidney disease: Secondary | ICD-10-CM | POA: Diagnosis not present

## 2015-08-28 DIAGNOSIS — E440 Moderate protein-calorie malnutrition: Secondary | ICD-10-CM | POA: Diagnosis not present

## 2015-08-28 DIAGNOSIS — D649 Anemia, unspecified: Secondary | ICD-10-CM | POA: Diagnosis not present

## 2015-08-28 DIAGNOSIS — C3490 Malignant neoplasm of unspecified part of unspecified bronchus or lung: Secondary | ICD-10-CM | POA: Diagnosis not present

## 2015-08-28 DIAGNOSIS — I4891 Unspecified atrial fibrillation: Secondary | ICD-10-CM | POA: Diagnosis not present

## 2015-08-28 DIAGNOSIS — G061 Intraspinal abscess and granuloma: Secondary | ICD-10-CM | POA: Diagnosis not present

## 2015-08-28 DIAGNOSIS — I82401 Acute embolism and thrombosis of unspecified deep veins of right lower extremity: Secondary | ICD-10-CM | POA: Diagnosis not present

## 2015-08-28 DIAGNOSIS — I5032 Chronic diastolic (congestive) heart failure: Secondary | ICD-10-CM | POA: Diagnosis not present

## 2015-08-30 ENCOUNTER — Encounter: Payer: Self-pay | Admitting: Internal Medicine

## 2015-08-30 ENCOUNTER — Ambulatory Visit (HOSPITAL_BASED_OUTPATIENT_CLINIC_OR_DEPARTMENT_OTHER): Payer: Medicare Other | Admitting: Internal Medicine

## 2015-08-30 ENCOUNTER — Telehealth: Payer: Self-pay | Admitting: Internal Medicine

## 2015-08-30 VITALS — BP 129/59 | HR 66 | Temp 98.0°F | Resp 18 | Ht 68.0 in

## 2015-08-30 DIAGNOSIS — C7951 Secondary malignant neoplasm of bone: Secondary | ICD-10-CM | POA: Diagnosis not present

## 2015-08-30 DIAGNOSIS — N189 Chronic kidney disease, unspecified: Secondary | ICD-10-CM | POA: Diagnosis not present

## 2015-08-30 NOTE — Telephone Encounter (Signed)
per pof to sch pt appt-gave pt copy of avs-adv central sch will call to sch trmt °

## 2015-08-30 NOTE — Progress Notes (Signed)
[facility] [phone]   [phone]  OFFICE PROGRESS NOTE  Wende Neighbors, MD [place] [address]  PRINCIPAL DIAGNOSIS: Stage IV non-small cell lung cancer diagnosed in March 2007, with disease recurrence in June 2016 with positive PDL 1 expression (50%).   PRIOR THERAPY:  1) Status post 6 cycles of systemic chemotherapy with carboplatin and docetaxel. Last dose was given January 16, 2006.  2) Palliative radiotherapy to the large right paraspinous mass with osseous invasion centered at L5 under the care of Dr. Valere Dross. 3) Systemic chemotherapy with carboplatin for AUC of 5 and paclitaxel 175 MG/M2 every 3 weeks with Neulasta support. First dose expected on 02/02/2015. Status post 2 cycles, last dose was given 02/23/2015 discontinued secondary to intolerance and mild disease progression.  CURRENT THERAPY: Ketruda 200 mg IV every 3 weeks. First dose 04/14/2015. Status post 3 cycles.  DISEASE STAGE: Stage IV non-small cell lung cancer diagnosed in March of 2007.  CHEMOTHERAPY INTENT: Palliative  CURRENT # OF CHEMOTHERAPY CYCLES: 3  CURRENT ANTIEMETICS: None  CURRENT SMOKING STATUS: Nonsmoker  ORAL CHEMOTHERAPY AND CONSENT: None  CURRENT BISPHOSPHONATES USE: None  LIVING WILL AND CODE STATUS: Full code  INTERVAL HISTORY: [name] 82 y.o. female returns to the clinic today for followup visit accompanied by her daughter and grandson. The patient was treated with 3 cycles of immunotherapy with Nat Math last dose was given 05/26/2015. She has been off treatment for the last 3 months secondary to frequent hospitalization with back abscess, septicemia and a nonhealing wound. She is feeling much better today with significant improvement in her condition and healing of the decubitus ulcer and back abscess. She lost a lot of weight over the last few months. She continues to have weakness of the lower extremities. She denied having  any significant chest pain, shortness of breath, cough or hemoptysis. She has no nausea or vomiting. She denied having any significant fever or chills. She is here today for reevaluation and discussion of her treatment options.  MEDICAL HISTORY: Past Medical History  Diagnosis Date  . Hypertension   . Hypercholesterolemia   . CKD (chronic kidney disease)   . Coronary artery disease     a.  LHC (06/04/05): LHC done in [place] with high grade RCA => s/p BMS to RCA;  b.  Nuclear (09/14/09): Lexiscan; Inf infarct with mild peri-infarct ishemia, EF 52%; Low Risk.  Marland Kitchen GERD (gastroesophageal reflux disease)   . Chronic anemia   . Ischemic cardiomyopathy     a. Echo (07/26/13): Mild LVH, EF 35-40%, diff HK, inf AK, Gr 2 DD, Tr AI, mildly dilated Ao root, MAC, mild MR, mild LAE, mod reduced RVSF.  . Non-small cell carcinoma of lung ([facility])     Stage IV  . Chronic fatigue 04/04/2015  . Chronic fatigue 04/04/2015  . A-fib ([facility])   . DVT (deep vein thrombosis) in pregnancy   . Chronic diastolic (congestive) heart failure ([facility])     ALLERGIES:  is allergic to amlodipine; ciprofloxacin; mirtazapine; statins; aspirin; cephalexin; hydralazine; iron; ambien; lorazepam; sulfonamide derivatives; zolpidem; penicillins; shellfish allergy; and tape.  MEDICATIONS:  Current Outpatient Prescriptions  Medication Sig Dispense Refill  . acetaminophen (TYLENOL) 500 MG tablet Take 500 mg by mouth daily as needed for mild pain.    . Amino Acids-Protein Hydrolys (FEEDING SUPPLEMENT, PRO-STAT SUGAR FREE 64,) LIQD Take 30 mLs by mouth 2 (two) times daily. 900 mL 0  . diltiazem (CARDIZEM) 60  MG tablet Take 1 tablet (60 mg total) by mouth 2 (two) times daily. 60 tablet 0  . diphenhydrAMINE (BENADRYL) 25 mg capsule Take 1 capsule (25 mg total) by mouth every 6 (six) hours as needed. (Patient taking differently: Take 25 mg by mouth every 6 (six) hours as needed for allergies. ) 30 capsule 0  . enoxaparin (LOVENOX) 60  MG/0.6ML injection Inject 0.6 mLs (60 mg total) into the skin daily. 30 Syringe 0  . feeding supplement, ENSURE ENLIVE, (ENSURE ENLIVE) LIQD Take 237 mLs by mouth 2 (two) times daily between meals. (Patient not taking: Reported on 07/25/2015) 237 mL 12  . furosemide (LASIX) 40 MG tablet Take 1 tablet (40 mg total) by mouth daily. 30 tablet   . gabapentin (NEURONTIN) 100 MG capsule Take 1 capsule (100 mg total) by mouth at bedtime. 30 capsule 0  . lidocaine-prilocaine (EMLA) cream Apply 1 application topically as needed. 30 g 2  . loperamide (IMODIUM) 2 MG capsule Take 1 capsule (2 mg total) by mouth every 6 (six) hours as needed for diarrhea or loose stools. 30 capsule 0  . metoprolol tartrate (LOPRESSOR) 25 MG tablet Take 0.5 tablets (12.5 mg total) by mouth 2 (two) times daily. 60 tablet 0  . pantoprazole (PROTONIX) 40 MG tablet Take 40 mg by mouth at bedtime.    . predniSONE (DELTASONE) 10 MG tablet Take 1 tablet (10 mg total) by mouth daily with breakfast. 30 tablet 0  . SANTYL ointment Apply 1 application topically at bedtime as needed. For bed sores     No current facility-administered medications for this visit.    SURGICAL HISTORY:  Past Surgical History  Procedure Laterality Date  . Hematoma evacuation  December 2006    groin  . Appendectomy    . Cataract extraction    . Hemorroidectomy    . Laminectomy      REVIEW OF SYSTEMS:  A comprehensive review of systems was negative except for: Constitutional: positive for fatigue and weight loss Musculoskeletal: positive for back pain and muscle weakness   PHYSICAL EXAMINATION: General appearance: alert, cooperative and no distress Head: Normocephalic, without obvious abnormality, atraumatic Neck: no adenopathy Lymph nodes: Cervical, supraclavicular, and axillary nodes normal. Resp: clear to auscultation bilaterally Back: symmetric, no curvature. ROM normal. No CVA tenderness., Decubitus ulcer in the lower back Cardio: regular rate  and rhythm, S1, S2 normal, no murmur, click, rub or gallop GI: soft, non-tender; bowel sounds normal; no masses,  no organomegaly Extremities: extremities normal, atraumatic, no cyanosis or edema Neurologic: Alert and oriented X 3, normal strength and tone. Normal symmetric reflexes. Normal coordination and gait  ECOG PERFORMANCE STATUS: 2 - Symptomatic, <50% confined to bed  Blood pressure 129/59, pulse 66, temperature 98 F (36.7 C), temperature source Oral, resp. rate 18, height '5\' 8"'$  (1.727 m), SpO2 98 %.  LABORATORY DATA: Lab Results  Component Value Date   WBC 4.1 07/27/2015   HGB 8.3* 07/27/2015   HCT 27.2* 07/27/2015   MCV 95.1 07/27/2015   PLT 145* 07/27/2015      Chemistry      Component Value Date/Time   NA 144 07/27/2015 0457   NA 138 06/16/2015 0904   NA 143 12/23/2011 0830   K 4.2 07/27/2015 0457   K 3.7 06/16/2015 0904   K 4.7 12/23/2011 0830   CL 113* 07/27/2015 0457   CL 104 06/19/2012 0958   CL 101 12/23/2011 0830   CO2 23 07/27/2015 0457   CO2 20* 06/16/2015  0904   CO2 27 12/23/2011 0830   BUN 48* 07/27/2015 0457   BUN 32.3* 06/16/2015 0904   BUN 25* 12/23/2011 0830   CREATININE 1.99* 07/27/2015 0457   CREATININE 2.0* 06/16/2015 0904   CREATININE 2.09* 02/01/2014 1545      Component Value Date/Time   CALCIUM 7.8* 07/27/2015 0457   CALCIUM 7.2* 06/16/2015 0904   CALCIUM 9.0 12/23/2011 0830   ALKPHOS 151* 07/25/2015 2030   ALKPHOS 164* 06/16/2015 0904   ALKPHOS 76 12/23/2011 0830   AST 42* 07/25/2015 2030   AST 15 06/16/2015 0904   AST 16 12/23/2011 0830   ALT 52 07/25/2015 2030   ALT <9 06/16/2015 0904   ALT 16 12/23/2011 0830   BILITOT 0.4 07/25/2015 2030   BILITOT 0.38 06/16/2015 0904   BILITOT 0.60 12/23/2011 0830       RADIOGRAPHIC STUDIES: No results found.  ASSESSMENT AND PLAN: This is a very pleasant 82 years old white female with history of stage IV non-small cell lung cancer diagnosed in March of 2007 status post 6 cycles of  systemic chemotherapy with carboplatin and docetaxel and has been observation since that time with no evidence for disease progression for almost 9 years.  She was found recently to have evidence for disease recurrence with large lumbar paraspinous mass with osseous invasion and the core biopsy was consistent with metastatic squamous cell carcinoma.  She is status post palliative radiotherapy to the soft tissue mass in the lumbar area. The patient underwent systemic chemotherapy with carboplatin and paclitaxel is status post 2 cycle but she has rough time tolerating her systemic chemotherapy with significant pancytopenia, fatigue and weakness.  Her recent CT scan of the chest, abdomen and pelvis showed improvement in the soft tissue mass in the lumbar area but there was some mild increase in some of the pulmonary nodules. I discussed the scan results with the patient and her family. I recommended for her to discontinue her current treatment with systemic chemotherapy with carboplatin and paclitaxel secondary to intolerance and mild disease progression. The patient has 50%  PDL 1 expression on her tumor tissue. She was started on treatment with Nat Math (pembrolizumab) for 3 cycles last dose was given 05/26/2015 but her treatment has been on hold for the last few months secondary to frequent hospitalization for back abscess and septicemia. She is feeling much better today and recovering slowly from her recent hospitalizations. She continues to have weakness in the lower extremity and weight loss. I recommended for the patient to have repeat CT scan of the chest, abdomen and pelvis without contrast for restaging of her disease before discussing the next step in management of her condition. I will see her back for follow-up visit in 3 weeks for reevaluation after the staging scan. For pain management, the patient will continue on Vicodin when necessary. The patient was advised to call immediately if she has  any concerning symptoms in the interval.  All questions were answered. The patient knows to call the clinic with any problems, questions or concerns. We can certainly see the patient much sooner if necessary.  Disclaimer: This note was dictated with voice recognition software. Similar sounding words can inadvertently be transcribed and may not be corrected upon review.

## 2015-08-31 DIAGNOSIS — N184 Chronic kidney disease, stage 4 (severe): Secondary | ICD-10-CM | POA: Diagnosis not present

## 2015-08-31 DIAGNOSIS — I130 Hypertensive heart and chronic kidney disease with heart failure and stage 1 through stage 4 chronic kidney disease, or unspecified chronic kidney disease: Secondary | ICD-10-CM | POA: Diagnosis not present

## 2015-08-31 DIAGNOSIS — I4891 Unspecified atrial fibrillation: Secondary | ICD-10-CM | POA: Diagnosis not present

## 2015-08-31 DIAGNOSIS — G061 Intraspinal abscess and granuloma: Secondary | ICD-10-CM | POA: Diagnosis not present

## 2015-08-31 DIAGNOSIS — E440 Moderate protein-calorie malnutrition: Secondary | ICD-10-CM | POA: Diagnosis not present

## 2015-08-31 DIAGNOSIS — I2510 Atherosclerotic heart disease of native coronary artery without angina pectoris: Secondary | ICD-10-CM | POA: Diagnosis not present

## 2015-08-31 DIAGNOSIS — J9611 Chronic respiratory failure with hypoxia: Secondary | ICD-10-CM | POA: Diagnosis not present

## 2015-08-31 DIAGNOSIS — I82401 Acute embolism and thrombosis of unspecified deep veins of right lower extremity: Secondary | ICD-10-CM | POA: Diagnosis not present

## 2015-08-31 DIAGNOSIS — D649 Anemia, unspecified: Secondary | ICD-10-CM | POA: Diagnosis not present

## 2015-08-31 DIAGNOSIS — C3490 Malignant neoplasm of unspecified part of unspecified bronchus or lung: Secondary | ICD-10-CM | POA: Diagnosis not present

## 2015-08-31 DIAGNOSIS — I5032 Chronic diastolic (congestive) heart failure: Secondary | ICD-10-CM | POA: Diagnosis not present

## 2015-08-31 DIAGNOSIS — C7951 Secondary malignant neoplasm of bone: Secondary | ICD-10-CM | POA: Diagnosis not present

## 2015-08-31 DIAGNOSIS — L89154 Pressure ulcer of sacral region, stage 4: Secondary | ICD-10-CM | POA: Diagnosis not present

## 2015-09-05 DIAGNOSIS — I4891 Unspecified atrial fibrillation: Secondary | ICD-10-CM | POA: Diagnosis not present

## 2015-09-05 DIAGNOSIS — I130 Hypertensive heart and chronic kidney disease with heart failure and stage 1 through stage 4 chronic kidney disease, or unspecified chronic kidney disease: Secondary | ICD-10-CM | POA: Diagnosis not present

## 2015-09-05 DIAGNOSIS — J9611 Chronic respiratory failure with hypoxia: Secondary | ICD-10-CM | POA: Diagnosis not present

## 2015-09-05 DIAGNOSIS — E440 Moderate protein-calorie malnutrition: Secondary | ICD-10-CM | POA: Diagnosis not present

## 2015-09-05 DIAGNOSIS — C3490 Malignant neoplasm of unspecified part of unspecified bronchus or lung: Secondary | ICD-10-CM | POA: Diagnosis not present

## 2015-09-05 DIAGNOSIS — L89154 Pressure ulcer of sacral region, stage 4: Secondary | ICD-10-CM | POA: Diagnosis not present

## 2015-09-05 DIAGNOSIS — D649 Anemia, unspecified: Secondary | ICD-10-CM | POA: Diagnosis not present

## 2015-09-05 DIAGNOSIS — N184 Chronic kidney disease, stage 4 (severe): Secondary | ICD-10-CM | POA: Diagnosis not present

## 2015-09-05 DIAGNOSIS — G061 Intraspinal abscess and granuloma: Secondary | ICD-10-CM | POA: Diagnosis not present

## 2015-09-05 DIAGNOSIS — I5032 Chronic diastolic (congestive) heart failure: Secondary | ICD-10-CM | POA: Diagnosis not present

## 2015-09-05 DIAGNOSIS — C7951 Secondary malignant neoplasm of bone: Secondary | ICD-10-CM | POA: Diagnosis not present

## 2015-09-05 DIAGNOSIS — I2510 Atherosclerotic heart disease of native coronary artery without angina pectoris: Secondary | ICD-10-CM | POA: Diagnosis not present

## 2015-09-05 DIAGNOSIS — I82401 Acute embolism and thrombosis of unspecified deep veins of right lower extremity: Secondary | ICD-10-CM | POA: Diagnosis not present

## 2015-09-08 DIAGNOSIS — I5032 Chronic diastolic (congestive) heart failure: Secondary | ICD-10-CM | POA: Diagnosis not present

## 2015-09-08 DIAGNOSIS — D649 Anemia, unspecified: Secondary | ICD-10-CM | POA: Diagnosis not present

## 2015-09-08 DIAGNOSIS — L89154 Pressure ulcer of sacral region, stage 4: Secondary | ICD-10-CM | POA: Diagnosis not present

## 2015-09-08 DIAGNOSIS — C7951 Secondary malignant neoplasm of bone: Secondary | ICD-10-CM | POA: Diagnosis not present

## 2015-09-08 DIAGNOSIS — N184 Chronic kidney disease, stage 4 (severe): Secondary | ICD-10-CM | POA: Diagnosis not present

## 2015-09-08 DIAGNOSIS — I4891 Unspecified atrial fibrillation: Secondary | ICD-10-CM | POA: Diagnosis not present

## 2015-09-08 DIAGNOSIS — I82401 Acute embolism and thrombosis of unspecified deep veins of right lower extremity: Secondary | ICD-10-CM | POA: Diagnosis not present

## 2015-09-08 DIAGNOSIS — C3490 Malignant neoplasm of unspecified part of unspecified bronchus or lung: Secondary | ICD-10-CM | POA: Diagnosis not present

## 2015-09-08 DIAGNOSIS — G061 Intraspinal abscess and granuloma: Secondary | ICD-10-CM | POA: Diagnosis not present

## 2015-09-08 DIAGNOSIS — E440 Moderate protein-calorie malnutrition: Secondary | ICD-10-CM | POA: Diagnosis not present

## 2015-09-08 DIAGNOSIS — I2510 Atherosclerotic heart disease of native coronary artery without angina pectoris: Secondary | ICD-10-CM | POA: Diagnosis not present

## 2015-09-08 DIAGNOSIS — I130 Hypertensive heart and chronic kidney disease with heart failure and stage 1 through stage 4 chronic kidney disease, or unspecified chronic kidney disease: Secondary | ICD-10-CM | POA: Diagnosis not present

## 2015-09-08 DIAGNOSIS — J9611 Chronic respiratory failure with hypoxia: Secondary | ICD-10-CM | POA: Diagnosis not present

## 2015-09-11 DIAGNOSIS — G061 Intraspinal abscess and granuloma: Secondary | ICD-10-CM | POA: Diagnosis not present

## 2015-09-11 DIAGNOSIS — I130 Hypertensive heart and chronic kidney disease with heart failure and stage 1 through stage 4 chronic kidney disease, or unspecified chronic kidney disease: Secondary | ICD-10-CM | POA: Diagnosis not present

## 2015-09-11 DIAGNOSIS — E440 Moderate protein-calorie malnutrition: Secondary | ICD-10-CM | POA: Diagnosis not present

## 2015-09-11 DIAGNOSIS — N184 Chronic kidney disease, stage 4 (severe): Secondary | ICD-10-CM | POA: Diagnosis not present

## 2015-09-11 DIAGNOSIS — D649 Anemia, unspecified: Secondary | ICD-10-CM | POA: Diagnosis not present

## 2015-09-11 DIAGNOSIS — I4891 Unspecified atrial fibrillation: Secondary | ICD-10-CM | POA: Diagnosis not present

## 2015-09-11 DIAGNOSIS — C7951 Secondary malignant neoplasm of bone: Secondary | ICD-10-CM | POA: Diagnosis not present

## 2015-09-11 DIAGNOSIS — J9611 Chronic respiratory failure with hypoxia: Secondary | ICD-10-CM | POA: Diagnosis not present

## 2015-09-11 DIAGNOSIS — I82401 Acute embolism and thrombosis of unspecified deep veins of right lower extremity: Secondary | ICD-10-CM | POA: Diagnosis not present

## 2015-09-11 DIAGNOSIS — L89154 Pressure ulcer of sacral region, stage 4: Secondary | ICD-10-CM | POA: Diagnosis not present

## 2015-09-11 DIAGNOSIS — C3490 Malignant neoplasm of unspecified part of unspecified bronchus or lung: Secondary | ICD-10-CM | POA: Diagnosis not present

## 2015-09-11 DIAGNOSIS — I2510 Atherosclerotic heart disease of native coronary artery without angina pectoris: Secondary | ICD-10-CM | POA: Diagnosis not present

## 2015-09-11 DIAGNOSIS — I5032 Chronic diastolic (congestive) heart failure: Secondary | ICD-10-CM | POA: Diagnosis not present

## 2015-09-14 DIAGNOSIS — E440 Moderate protein-calorie malnutrition: Secondary | ICD-10-CM | POA: Diagnosis not present

## 2015-09-14 DIAGNOSIS — C3490 Malignant neoplasm of unspecified part of unspecified bronchus or lung: Secondary | ICD-10-CM | POA: Diagnosis not present

## 2015-09-14 DIAGNOSIS — N184 Chronic kidney disease, stage 4 (severe): Secondary | ICD-10-CM | POA: Diagnosis not present

## 2015-09-14 DIAGNOSIS — I130 Hypertensive heart and chronic kidney disease with heart failure and stage 1 through stage 4 chronic kidney disease, or unspecified chronic kidney disease: Secondary | ICD-10-CM | POA: Diagnosis not present

## 2015-09-14 DIAGNOSIS — G061 Intraspinal abscess and granuloma: Secondary | ICD-10-CM | POA: Diagnosis not present

## 2015-09-14 DIAGNOSIS — I2510 Atherosclerotic heart disease of native coronary artery without angina pectoris: Secondary | ICD-10-CM | POA: Diagnosis not present

## 2015-09-14 DIAGNOSIS — L89154 Pressure ulcer of sacral region, stage 4: Secondary | ICD-10-CM | POA: Diagnosis not present

## 2015-09-14 DIAGNOSIS — C7951 Secondary malignant neoplasm of bone: Secondary | ICD-10-CM | POA: Diagnosis not present

## 2015-09-14 DIAGNOSIS — I4891 Unspecified atrial fibrillation: Secondary | ICD-10-CM | POA: Diagnosis not present

## 2015-09-14 DIAGNOSIS — I5032 Chronic diastolic (congestive) heart failure: Secondary | ICD-10-CM | POA: Diagnosis not present

## 2015-09-14 DIAGNOSIS — D649 Anemia, unspecified: Secondary | ICD-10-CM | POA: Diagnosis not present

## 2015-09-14 DIAGNOSIS — I82401 Acute embolism and thrombosis of unspecified deep veins of right lower extremity: Secondary | ICD-10-CM | POA: Diagnosis not present

## 2015-09-14 DIAGNOSIS — J9611 Chronic respiratory failure with hypoxia: Secondary | ICD-10-CM | POA: Diagnosis not present

## 2015-09-19 DIAGNOSIS — I82401 Acute embolism and thrombosis of unspecified deep veins of right lower extremity: Secondary | ICD-10-CM | POA: Diagnosis not present

## 2015-09-19 DIAGNOSIS — C3490 Malignant neoplasm of unspecified part of unspecified bronchus or lung: Secondary | ICD-10-CM | POA: Diagnosis not present

## 2015-09-19 DIAGNOSIS — L89154 Pressure ulcer of sacral region, stage 4: Secondary | ICD-10-CM | POA: Diagnosis not present

## 2015-09-19 DIAGNOSIS — I2510 Atherosclerotic heart disease of native coronary artery without angina pectoris: Secondary | ICD-10-CM | POA: Diagnosis not present

## 2015-09-19 DIAGNOSIS — J9611 Chronic respiratory failure with hypoxia: Secondary | ICD-10-CM | POA: Diagnosis not present

## 2015-09-19 DIAGNOSIS — E440 Moderate protein-calorie malnutrition: Secondary | ICD-10-CM | POA: Diagnosis not present

## 2015-09-19 DIAGNOSIS — C7951 Secondary malignant neoplasm of bone: Secondary | ICD-10-CM | POA: Diagnosis not present

## 2015-09-19 DIAGNOSIS — I130 Hypertensive heart and chronic kidney disease with heart failure and stage 1 through stage 4 chronic kidney disease, or unspecified chronic kidney disease: Secondary | ICD-10-CM | POA: Diagnosis not present

## 2015-09-19 DIAGNOSIS — D649 Anemia, unspecified: Secondary | ICD-10-CM | POA: Diagnosis not present

## 2015-09-19 DIAGNOSIS — G061 Intraspinal abscess and granuloma: Secondary | ICD-10-CM | POA: Diagnosis not present

## 2015-09-19 DIAGNOSIS — I5032 Chronic diastolic (congestive) heart failure: Secondary | ICD-10-CM | POA: Diagnosis not present

## 2015-09-19 DIAGNOSIS — I4891 Unspecified atrial fibrillation: Secondary | ICD-10-CM | POA: Diagnosis not present

## 2015-09-19 DIAGNOSIS — N184 Chronic kidney disease, stage 4 (severe): Secondary | ICD-10-CM | POA: Diagnosis not present

## 2015-09-20 ENCOUNTER — Ambulatory Visit (HOSPITAL_COMMUNITY)
Admission: RE | Admit: 2015-09-20 | Discharge: 2015-09-20 | Disposition: A | Discharge status: Home / Self Care | Payer: Medicare Other | Source: Ambulatory Visit | Attending: Internal Medicine | Admitting: Internal Medicine

## 2015-09-20 ENCOUNTER — Other Ambulatory Visit: Payer: Medicare Other

## 2015-09-20 ENCOUNTER — Telehealth: Payer: Self-pay | Admitting: Internal Medicine

## 2015-09-20 ENCOUNTER — Other Ambulatory Visit (HOSPITAL_BASED_OUTPATIENT_CLINIC_OR_DEPARTMENT_OTHER): Payer: Medicare Other

## 2015-09-20 ENCOUNTER — Ambulatory Visit (HOSPITAL_BASED_OUTPATIENT_CLINIC_OR_DEPARTMENT_OTHER): Payer: Medicare Other

## 2015-09-20 ENCOUNTER — Ambulatory Visit (HOSPITAL_BASED_OUTPATIENT_CLINIC_OR_DEPARTMENT_OTHER): Payer: Medicare Other | Admitting: Internal Medicine

## 2015-09-20 ENCOUNTER — Encounter: Payer: Self-pay | Admitting: Internal Medicine

## 2015-09-20 VITALS — BP 111/53 | HR 67 | Temp 97.5°F | Resp 18 | Ht 68.0 in

## 2015-09-20 DIAGNOSIS — N183 Chronic kidney disease, stage 3 (moderate): Secondary | ICD-10-CM

## 2015-09-20 DIAGNOSIS — E43 Unspecified severe protein-calorie malnutrition: Secondary | ICD-10-CM

## 2015-09-20 DIAGNOSIS — I712 Thoracic aortic aneurysm, without rupture: Secondary | ICD-10-CM | POA: Diagnosis not present

## 2015-09-20 DIAGNOSIS — D638 Anemia in other chronic diseases classified elsewhere: Secondary | ICD-10-CM

## 2015-09-20 DIAGNOSIS — Z95828 Presence of other vascular implants and grafts: Secondary | ICD-10-CM

## 2015-09-20 DIAGNOSIS — C7951 Secondary malignant neoplasm of bone: Secondary | ICD-10-CM | POA: Insufficient documentation

## 2015-09-20 DIAGNOSIS — R918 Other nonspecific abnormal finding of lung field: Secondary | ICD-10-CM | POA: Insufficient documentation

## 2015-09-20 DIAGNOSIS — C3492 Malignant neoplasm of unspecified part of left bronchus or lung: Secondary | ICD-10-CM

## 2015-09-20 DIAGNOSIS — E440 Moderate protein-calorie malnutrition: Secondary | ICD-10-CM | POA: Diagnosis not present

## 2015-09-20 DIAGNOSIS — R5382 Chronic fatigue, unspecified: Secondary | ICD-10-CM

## 2015-09-20 DIAGNOSIS — C3490 Malignant neoplasm of unspecified part of unspecified bronchus or lung: Secondary | ICD-10-CM | POA: Insufficient documentation

## 2015-09-20 DIAGNOSIS — N179 Acute kidney failure, unspecified: Secondary | ICD-10-CM

## 2015-09-20 LAB — CBC WITH DIFFERENTIAL/PLATELET
BASO%: 0.8 % (ref 0.0–2.0)
Basophils Absolute: 0.1 10*3/uL (ref 0.0–0.1)
EOS%: 3.2 % (ref 0.0–7.0)
Eosinophils Absolute: 0.3 10*3/uL (ref 0.0–0.5)
HEMATOCRIT: 34.9 % (ref 34.8–46.6)
HGB: 11.4 g/dL — ABNORMAL LOW (ref 11.6–15.9)
LYMPH#: 0.8 10*3/uL — AB (ref 0.9–3.3)
LYMPH%: 10 % — ABNORMAL LOW (ref 14.0–49.7)
MCH: 29.1 pg (ref 25.1–34.0)
MCHC: 32.6 g/dL (ref 31.5–36.0)
MCV: 89.1 fL (ref 79.5–101.0)
MONO#: 0.9 10*3/uL (ref 0.1–0.9)
MONO%: 10.8 % (ref 0.0–14.0)
NEUT%: 75.2 % (ref 38.4–76.8)
NEUTROS ABS: 6 10*3/uL (ref 1.5–6.5)
PLATELETS: 299 10*3/uL (ref 145–400)
RBC: 3.92 10*6/uL (ref 3.70–5.45)
RDW: 13.7 % (ref 11.2–14.5)
WBC: 8 10*3/uL (ref 3.9–10.3)

## 2015-09-20 LAB — COMPREHENSIVE METABOLIC PANEL
ALT: 12 U/L (ref 0–55)
AST: 23 U/L (ref 5–34)
Albumin: 2.5 g/dL — ABNORMAL LOW (ref 3.5–5.0)
Alkaline Phosphatase: 163 U/L — ABNORMAL HIGH (ref 40–150)
Anion Gap: 11 mEq/L (ref 3–11)
BILIRUBIN TOTAL: 0.32 mg/dL (ref 0.20–1.20)
BUN: 44.4 mg/dL — AB (ref 7.0–26.0)
CALCIUM: 9.1 mg/dL (ref 8.4–10.4)
CHLORIDE: 99 meq/L (ref 98–109)
CO2: 26 meq/L (ref 22–29)
CREATININE: 2.9 mg/dL — AB (ref 0.6–1.1)
EGFR: 15 mL/min/{1.73_m2} — ABNORMAL LOW (ref >90)
Glucose: 120 mg/dl (ref 70–140)
Potassium: 4.8 mEq/L (ref 3.5–5.1)
Sodium: 136 mEq/L (ref 136–145)
TOTAL PROTEIN: 6.4 g/dL (ref 6.4–8.3)

## 2015-09-20 LAB — TSH: TSH: 5.457 m(IU)/L — ABNORMAL HIGH (ref 0.308–3.960)

## 2015-09-20 MED ORDER — HEPARIN SOD (PORK) LOCK FLUSH 100 UNIT/ML IV SOLN
500.0000 [IU] | Freq: Once | INTRAVENOUS | Status: AC
  Administered 2015-09-20: 500 [IU] via INTRAVENOUS
  Filled 2015-09-20: qty 5

## 2015-09-20 MED ORDER — SODIUM CHLORIDE 0.9% FLUSH
10.0000 mL | INTRAVENOUS | Status: DC | PRN
  Administered 2015-09-20: 10 mL via INTRAVENOUS
  Filled 2015-09-20: qty 10

## 2015-09-20 NOTE — Telephone Encounter (Signed)
per pof to sch pt appt-gave pt copy of avs-[facility] will call to [facility] trmt

## 2015-09-20 NOTE — Patient Instructions (Signed)

## 2015-09-20 NOTE — Progress Notes (Signed)
[name] [phone]   [phone]  OFFICE PROGRESS NOTE  [name] Neighbors, MD [place] [address]  PRINCIPAL DIAGNOSIS: Stage IV non-small cell lung cancer diagnosed in March 2007, with disease recurrence in June 2016 with positive PDL 1 expression (50%).   PRIOR THERAPY:  1) Status post 6 cycles of systemic chemotherapy with carboplatin and docetaxel. Last dose was given January 16, 2006.  2) Palliative radiotherapy to the large right paraspinous mass with osseous invasion centered at L5 under the care of Dr. Valere Dross. 3) Systemic chemotherapy with carboplatin for AUC of 5 and paclitaxel 175 MG/M2 every 3 weeks with Neulasta support. First dose expected on 02/02/2015. Status post 2 cycles, last dose was given 02/23/2015 discontinued secondary to intolerance and mild disease progression. 4) Ketruda 200 mg IV every 3 weeks. First dose 04/14/2015. Status post 3 cycles. Treatment was discontinued after the patient had several hospitalization with recurrent infection and back abscess but were unrelated to her treatment.  CURRENT THERAPY: Observation.  DISEASE STAGE: Stage IV non-small cell lung cancer diagnosed in March of 2007.  CHEMOTHERAPY INTENT: Palliative  CURRENT # OF CHEMOTHERAPY CYCLES: 0  CURRENT ANTIEMETICS: None  CURRENT SMOKING STATUS: Nonsmoker  ORAL CHEMOTHERAPY AND CONSENT: None  CURRENT BISPHOSPHONATES USE: None  LIVING WILL AND CODE STATUS: Full code  INTERVAL HISTORY: [name] 82 y.o. female returns to the clinic today for followup visit accompanied by her daughter and grandson. The patient was treated with 3 cycles of immunotherapy with Nat Math last dose was given 05/26/2015. She has been off treatment for the last 4 months secondary to frequent hospitalization with back abscess, septicemia and a nonhealing wound. She is feeling much better today. She gained several pounds since her last visit. She  continues to have weakness of the lower extremities. She would like to start physical therapy and she will contact her primary care physician for orders. She denied having any significant chest pain, shortness of breath, cough or hemoptysis. She has no nausea or vomiting. She denied having any significant fever or chills. She had repeat CT scan of the chest, abdomen and pelvis performed earlier today and she is here for evaluation and discussion of her scan results.  MEDICAL HISTORY: Past Medical History  Diagnosis Date  . Hypertension   . Hypercholesterolemia   . CKD (chronic kidney disease)   . Coronary artery disease     a.  LHC (06/04/05): LHC done in [place] with high grade RCA => s/p BMS to RCA;  b.  Nuclear (09/14/09): Lexiscan; Inf infarct with mild peri-infarct ishemia, EF 52%; Low Risk.  Marland Kitchen GERD (gastroesophageal reflux disease)   . Chronic anemia   . Ischemic cardiomyopathy     a. Echo (07/26/13): Mild LVH, EF 35-40%, diff HK, inf AK, Gr 2 DD, Tr AI, mildly dilated Ao root, MAC, mild MR, mild LAE, mod reduced RVSF.  . Non-small cell carcinoma of lung ([facility])     Stage IV  . Chronic fatigue 04/04/2015  . Chronic fatigue 04/04/2015  . A-fib ([place])   . DVT (deep vein thrombosis) in pregnancy   . Chronic diastolic (congestive) heart failure (H[facility])     ALLERGIES:  is allergic to amlodipine; ciprofloxacin; mirtazapine; statins; aspirin; cephalexin; hydralazine; iron; ambien; lorazepam; sulfonamide derivatives; zolpidem; penicillins; shellfish allergy; and tape.  MEDICATIONS:  Current Outpatient Prescriptions  Medication Sig Dispense Refill  . acetaminophen (TYLENOL) 500 MG tablet Take 500 mg by mouth daily  as needed for mild pain.    . Amino Acids-Protein Hydrolys (FEEDING SUPPLEMENT, PRO-STAT SUGAR FREE 64,) LIQD Take 30 mLs by mouth 2 (two) times daily. 900 mL 0  . diltiazem (CARDIZEM) 60 MG tablet Take 1 tablet (60 mg total) by mouth 2 (two) times daily. 60 tablet 0  .  diphenhydrAMINE (BENADRYL) 25 mg capsule Take 1 capsule (25 mg total) by mouth every 6 (six) hours as needed. (Patient taking differently: Take 25 mg by mouth every 6 (six) hours as needed for allergies. ) 30 capsule 0  . enoxaparin (LOVENOX) 60 MG/0.6ML injection Inject 0.6 mLs (60 mg total) into the skin daily. 30 Syringe 0  . feeding supplement, ENSURE ENLIVE, (ENSURE ENLIVE) LIQD Take 237 mLs by mouth 2 (two) times daily between meals. (Patient not taking: Reported on 07/25/2015) 237 mL 12  . furosemide (LASIX) 40 MG tablet Take 1 tablet (40 mg total) by mouth daily. 30 tablet   . gabapentin (NEURONTIN) 100 MG capsule Take 1 capsule (100 mg total) by mouth at bedtime. 30 capsule 0  . lidocaine-prilocaine (EMLA) cream Apply 1 application topically as needed. 30 g 2  . loperamide (IMODIUM) 2 MG capsule Take 1 capsule (2 mg total) by mouth every 6 (six) hours as needed for diarrhea or loose stools. 30 capsule 0  . metoprolol tartrate (LOPRESSOR) 25 MG tablet Take 0.5 tablets (12.5 mg total) by mouth 2 (two) times daily. 60 tablet 0  . pantoprazole (PROTONIX) 40 MG tablet Take 40 mg by mouth at bedtime.    . predniSONE (DELTASONE) 10 MG tablet Take 1 tablet (10 mg total) by mouth daily with breakfast. 30 tablet 0  . SANTYL ointment Apply 1 application topically at bedtime as needed. For bed sores     No current facility-administered medications for this visit.    SURGICAL HISTORY:  Past Surgical History  Procedure Laterality Date  . Hematoma evacuation  December 2006    groin  . Appendectomy    . Cataract extraction    . Hemorroidectomy    . Laminectomy      REVIEW OF SYSTEMS:  A comprehensive review of systems was negative except for: Constitutional: positive for fatigue Musculoskeletal: positive for muscle weakness   PHYSICAL EXAMINATION: General appearance: alert, cooperative and no distress Head: Normocephalic, without obvious abnormality, atraumatic Neck: no adenopathy Lymph  nodes: Cervical, supraclavicular, and axillary nodes normal. Resp: clear to auscultation bilaterally Back: symmetric, no curvature. ROM normal. No CVA tenderness., Decubitus ulcer in the lower back Cardio: regular rate and rhythm, S1, S2 normal, no murmur, click, rub or gallop GI: soft, non-tender; bowel sounds normal; no masses,  no organomegaly Extremities: extremities normal, atraumatic, no cyanosis or edema Neurologic: Alert and oriented X 3, normal strength and tone. Normal symmetric reflexes. Normal coordination and gait  ECOG PERFORMANCE STATUS: 2 - Symptomatic, <50% confined to bed  Blood pressure 111/53, pulse 67, temperature 97.5 F (36.4 C), temperature source Oral, resp. rate 18, height '5\' 8"'$  (1.727 m), SpO2 100 %.  LABORATORY DATA: Lab Results  Component Value Date   WBC 8.0 09/20/2015   HGB 11.4* 09/20/2015   HCT 34.9 09/20/2015   MCV 89.1 09/20/2015   PLT 299 09/20/2015      Chemistry      Component Value Date/Time   NA 136 09/20/2015 1321   NA 144 07/27/2015 0457   NA 143 12/23/2011 0830   K 4.8 09/20/2015 1321   K 4.2 07/27/2015 0457   K 4.7 12/23/2011  0830   CL 113* 07/27/2015 0457   CL 104 06/19/2012 0958   CL 101 12/23/2011 0830   CO2 26 09/20/2015 1321   CO2 23 07/27/2015 0457   CO2 27 12/23/2011 0830   BUN 44.4* 09/20/2015 1321   BUN 48* 07/27/2015 0457   BUN 25* 12/23/2011 0830   CREATININE 2.9* 09/20/2015 1321   CREATININE 1.99* 07/27/2015 0457   CREATININE 2.09* 02/01/2014 1545      Component Value Date/Time   CALCIUM 9.1 09/20/2015 1321   CALCIUM 7.8* 07/27/2015 0457   CALCIUM 9.0 12/23/2011 0830   ALKPHOS 163* 09/20/2015 1321   ALKPHOS 151* 07/25/2015 2030   ALKPHOS 76 12/23/2011 0830   AST 23 09/20/2015 1321   AST 42* 07/25/2015 2030   AST 16 12/23/2011 0830   ALT 12 09/20/2015 1321   ALT 52 07/25/2015 2030   ALT 16 12/23/2011 0830   BILITOT 0.32 09/20/2015 1321   BILITOT 0.4 07/25/2015 2030   BILITOT 0.60 12/23/2011 0830        RADIOGRAPHIC STUDIES: Ct Abdomen Pelvis Wo Contrast  09/20/2015  CLINICAL DATA:  Restaging lung cancer. EXAM: CT CHEST, ABDOMEN AND PELVIS WITHOUT CONTRAST TECHNIQUE: Multidetector CT imaging of the chest, abdomen and pelvis was performed following the standard protocol without IV contrast. COMPARISON:  06/14/2015 FINDINGS: CT CHEST FINDINGS Mediastinum/Lymph Nodes: The heart size appears normal. There is no pericardial effusion. Aortic atherosclerosis noted. Calcification within the RCA, LAD and left circumflex coronary artery noted. The ascending thoracic aorta measures 4 cm, image 35 of series 2. No mediastinal or hilar adenopathy identified. There has been no axillary or supraclavicular adenopathy. Lungs/Pleura: Resolution of pleural effusions. Moderate changes of centrilobular emphysema identified. Diffuse bronchial wall thickening identified. The left upper lobe lung nodule measures 1.1 cm, image 58 of series 4. Previously 1 cm. Subpleural density within the posterior right lung base is identified measuring 3.1 by 0.9 cm, image 133 of series 4. This is nonspecific but in the setting of recent pleural effusions may reflect rounded atelectasis. Stable 3 mm lung nodule, image 90 of series 4. Tiny nodules within the periphery of the right upper lobe are again noted other small tiny nodules in the right upper lobe are stable. Musculoskeletal: Bones are osteopenic. No aggressive lytic or sclerotic bone lesions. CT ABDOMEN PELVIS FINDINGS Hepatobiliary: No suspicious liver abnormalities identified. Previous cholecystectomy. No biliary dilatation. Pancreas: No mass or inflammatory process identified on this un-enhanced exam. Spleen: Within normal limits in size. Adrenals/Urinary Tract: Normal appearance of the adrenal glands. Bilateral renal atrophy, cortical scarring noted. There are bilateral renal cysts of varying density which are incompletely characterized without IV contrast. Right-sided nephro  ureteral stent is in place. No hydronephrosis. Urinary bladder is collapsed around a Foley catheter balloon. Stomach/Bowel: The stomach is within normal limits. The small bowel loops have a normal course and caliber. No obstruction. Normal appearance of the colon. Vascular/Lymphatic: Calcified atherosclerotic disease involves the abdominal aorta. No aneurysm. No enlarged retroperitoneal or mesenteric adenopathy. No enlarged pelvic or inguinal lymph nodes. Reproductive: Previous hysterectomy.  No adnexal mass. Other:   Paraspinous fluid collection has resolved in the interval. Musculoskeletal: Right sacral soft tissue mass measures 3.6 x 5.3 cm, image 95 of series 2. Previously 4.6 x 7.0 cm. Lytic lesions involving L4 and L5 are again noted and appear unchanged from previous study. Associated L5 compression fracture is again noted, stable. IMPRESSION: 1. Resolution of previous bilateral pleural effusions. A ovoid subpleural area of soft tissue consolidation is noted  in the right base which may represent rounded atelectasis. This would be difficult to distinguish from disease recurrence and attention on follow-up imaging is recommended. 2. Stable spiculated left upper lobe lung nodule and scattered small nodules within the right lung. 3. No change in osseous metastatic disease which predominantly involves the L4, L5 vertebra and right side of sacrum. 4. Resolution of paraspinous fluid collection. 5. Indeterminate, mixed attenuation foci within the upper pole of right kidney are stable and incompletely characterized without IV contrast. 6. Stable appearance of ascending aortic aneurysm. Electronically Signed   By: Kerby Moors M.D.   On: 09/20/2015 13:41   Ct Chest Wo Contrast  09/20/2015  CLINICAL DATA:  Restaging lung cancer. EXAM: CT CHEST, ABDOMEN AND PELVIS WITHOUT CONTRAST TECHNIQUE: Multidetector CT imaging of the chest, abdomen and pelvis was performed following the standard protocol without IV contrast.  COMPARISON:  06/14/2015 FINDINGS: CT CHEST FINDINGS Mediastinum/Lymph Nodes: The heart size appears normal. There is no pericardial effusion. Aortic atherosclerosis noted. Calcification within the RCA, LAD and left circumflex coronary artery noted. The ascending thoracic aorta measures 4 cm, image 35 of series 2. No mediastinal or hilar adenopathy identified. There has been no axillary or supraclavicular adenopathy. Lungs/Pleura: Resolution of pleural effusions. Moderate changes of centrilobular emphysema identified. Diffuse bronchial wall thickening identified. The left upper lobe lung nodule measures 1.1 cm, image 58 of series 4. Previously 1 cm. Subpleural density within the posterior right lung base is identified measuring 3.1 by 0.9 cm, image 133 of series 4. This is nonspecific but in the setting of recent pleural effusions may reflect rounded atelectasis. Stable 3 mm lung nodule, image 90 of series 4. Tiny nodules within the periphery of the right upper lobe are again noted other small tiny nodules in the right upper lobe are stable. Musculoskeletal: Bones are osteopenic. No aggressive lytic or sclerotic bone lesions. CT ABDOMEN PELVIS FINDINGS Hepatobiliary: No suspicious liver abnormalities identified. Previous cholecystectomy. No biliary dilatation. Pancreas: No mass or inflammatory process identified on this un-enhanced exam. Spleen: Within normal limits in size. Adrenals/Urinary Tract: Normal appearance of the adrenal glands. Bilateral renal atrophy, cortical scarring noted. There are bilateral renal cysts of varying density which are incompletely characterized without IV contrast. Right-sided nephro ureteral stent is in place. No hydronephrosis. Urinary bladder is collapsed around a Foley catheter balloon. Stomach/Bowel: The stomach is within normal limits. The small bowel loops have a normal course and caliber. No obstruction. Normal appearance of the colon. Vascular/Lymphatic: Calcified  atherosclerotic disease involves the abdominal aorta. No aneurysm. No enlarged retroperitoneal or mesenteric adenopathy. No enlarged pelvic or inguinal lymph nodes. Reproductive: Previous hysterectomy.  No adnexal mass. Other:   Paraspinous fluid collection has resolved in the interval. Musculoskeletal: Right sacral soft tissue mass measures 3.6 x 5.3 cm, image 95 of series 2. Previously 4.6 x 7.0 cm. Lytic lesions involving L4 and L5 are again noted and appear unchanged from previous study. Associated L5 compression fracture is again noted, stable. IMPRESSION: 1. Resolution of previous bilateral pleural effusions. A ovoid subpleural area of soft tissue consolidation is noted in the right base which may represent rounded atelectasis. This would be difficult to distinguish from disease recurrence and attention on follow-up imaging is recommended. 2. Stable spiculated left upper lobe lung nodule and scattered small nodules within the right lung. 3. No change in osseous metastatic disease which predominantly involves the L4, L5 vertebra and right side of sacrum. 4. Resolution of paraspinous fluid collection. 5. Indeterminate, mixed attenuation foci  within the upper pole of right kidney are stable and incompletely characterized without IV contrast. 6. Stable appearance of ascending aortic aneurysm. Electronically Signed   By: Kerby Moors M.D.   On: 09/20/2015 13:41    ASSESSMENT AND PLAN: This is a very pleasant 82 years old white female with history of stage IV non-small cell lung cancer diagnosed in March of 2007 status post 6 cycles of systemic chemotherapy with carboplatin and docetaxel and has been observation since that time with no evidence for disease progression for almost 9 years.  She was found recently to have evidence for disease recurrence with large lumbar paraspinous mass with osseous invasion and the core biopsy was consistent with metastatic squamous cell carcinoma.  She is status post  palliative radiotherapy to the soft tissue mass in the lumbar area. The patient underwent systemic chemotherapy with carboplatin and paclitaxel is status post 2 cycle but she has rough time tolerating her systemic chemotherapy with significant pancytopenia, fatigue and weakness.  Her recent CT scan of the chest, abdomen and pelvis showed improvement in the soft tissue mass in the lumbar area but there was some mild increase in some of the pulmonary nodules. I discussed the scan results with the patient and her family. I recommended for her to discontinue her current treatment with systemic chemotherapy with carboplatin and paclitaxel secondary to intolerance and mild disease progression. The patient has 50%  PDL 1 expression on her tumor tissue. She was started on treatment with Nat Math (pembrolizumab) for 3 cycles last dose was given 05/26/2015 but her treatment has been on hold for the last few months secondary to frequent hospitalization for back abscess and septicemia.  The CT scan of the chest, abdomen and pelvis performed earlier today showed no evidence for disease progression. I discussed the scan results with the patient and her family. I recommended for her to continue on observation with repeat CT scan of the chest, abdomen and pelvis in 3 months. For the weakness of the lower extremities, the patient will contact her primary care physician for physical therapy orders. For pain management, the patient will continue on Vicodin when necessary. The patient was advised to call immediately if she has any concerning symptoms in the interval.  All questions were answered. The patient knows to call the clinic with any problems, questions or concerns. We can certainly see the patient much sooner if necessary.  Disclaimer: This note was dictated with voice recognition software. Similar sounding words can inadvertently be transcribed and may not be corrected upon review.

## 2015-09-21 DIAGNOSIS — B964 Proteus (mirabilis) (morganii) as the cause of diseases classified elsewhere: Secondary | ICD-10-CM | POA: Diagnosis not present

## 2015-09-21 DIAGNOSIS — L8990 Pressure ulcer of unspecified site, unspecified stage: Secondary | ICD-10-CM | POA: Diagnosis not present

## 2015-09-22 DIAGNOSIS — L89154 Pressure ulcer of sacral region, stage 4: Secondary | ICD-10-CM | POA: Diagnosis not present

## 2015-09-22 DIAGNOSIS — I5032 Chronic diastolic (congestive) heart failure: Secondary | ICD-10-CM | POA: Diagnosis not present

## 2015-09-22 DIAGNOSIS — I2510 Atherosclerotic heart disease of native coronary artery without angina pectoris: Secondary | ICD-10-CM | POA: Diagnosis not present

## 2015-09-22 DIAGNOSIS — D649 Anemia, unspecified: Secondary | ICD-10-CM | POA: Diagnosis not present

## 2015-09-22 DIAGNOSIS — J9611 Chronic respiratory failure with hypoxia: Secondary | ICD-10-CM | POA: Diagnosis not present

## 2015-09-22 DIAGNOSIS — C3490 Malignant neoplasm of unspecified part of unspecified bronchus or lung: Secondary | ICD-10-CM | POA: Diagnosis not present

## 2015-09-22 DIAGNOSIS — E440 Moderate protein-calorie malnutrition: Secondary | ICD-10-CM | POA: Diagnosis not present

## 2015-09-22 DIAGNOSIS — I130 Hypertensive heart and chronic kidney disease with heart failure and stage 1 through stage 4 chronic kidney disease, or unspecified chronic kidney disease: Secondary | ICD-10-CM | POA: Diagnosis not present

## 2015-09-22 DIAGNOSIS — I4891 Unspecified atrial fibrillation: Secondary | ICD-10-CM | POA: Diagnosis not present

## 2015-09-22 DIAGNOSIS — C7951 Secondary malignant neoplasm of bone: Secondary | ICD-10-CM | POA: Diagnosis not present

## 2015-09-22 DIAGNOSIS — I82401 Acute embolism and thrombosis of unspecified deep veins of right lower extremity: Secondary | ICD-10-CM | POA: Diagnosis not present

## 2015-09-22 DIAGNOSIS — N184 Chronic kidney disease, stage 4 (severe): Secondary | ICD-10-CM | POA: Diagnosis not present

## 2015-09-22 DIAGNOSIS — G061 Intraspinal abscess and granuloma: Secondary | ICD-10-CM | POA: Diagnosis not present

## 2015-09-23 DIAGNOSIS — I5033 Acute on chronic diastolic (congestive) heart failure: Secondary | ICD-10-CM | POA: Diagnosis not present

## 2015-09-23 DIAGNOSIS — M6281 Muscle weakness (generalized): Secondary | ICD-10-CM | POA: Diagnosis not present

## 2015-09-26 ENCOUNTER — Ambulatory Visit: Payer: Medicare Other | Admitting: Urology

## 2015-09-26 DIAGNOSIS — I4891 Unspecified atrial fibrillation: Secondary | ICD-10-CM | POA: Diagnosis not present

## 2015-09-26 DIAGNOSIS — C7951 Secondary malignant neoplasm of bone: Secondary | ICD-10-CM | POA: Diagnosis not present

## 2015-09-26 DIAGNOSIS — N184 Chronic kidney disease, stage 4 (severe): Secondary | ICD-10-CM | POA: Diagnosis not present

## 2015-09-26 DIAGNOSIS — I2510 Atherosclerotic heart disease of native coronary artery without angina pectoris: Secondary | ICD-10-CM | POA: Diagnosis not present

## 2015-09-26 DIAGNOSIS — C3490 Malignant neoplasm of unspecified part of unspecified bronchus or lung: Secondary | ICD-10-CM | POA: Diagnosis not present

## 2015-09-26 DIAGNOSIS — G061 Intraspinal abscess and granuloma: Secondary | ICD-10-CM | POA: Diagnosis not present

## 2015-09-26 DIAGNOSIS — I5032 Chronic diastolic (congestive) heart failure: Secondary | ICD-10-CM | POA: Diagnosis not present

## 2015-09-26 DIAGNOSIS — E440 Moderate protein-calorie malnutrition: Secondary | ICD-10-CM | POA: Diagnosis not present

## 2015-09-26 DIAGNOSIS — I130 Hypertensive heart and chronic kidney disease with heart failure and stage 1 through stage 4 chronic kidney disease, or unspecified chronic kidney disease: Secondary | ICD-10-CM | POA: Diagnosis not present

## 2015-09-26 DIAGNOSIS — D649 Anemia, unspecified: Secondary | ICD-10-CM | POA: Diagnosis not present

## 2015-09-26 DIAGNOSIS — I82401 Acute embolism and thrombosis of unspecified deep veins of right lower extremity: Secondary | ICD-10-CM | POA: Diagnosis not present

## 2015-09-26 DIAGNOSIS — L89154 Pressure ulcer of sacral region, stage 4: Secondary | ICD-10-CM | POA: Diagnosis not present

## 2015-09-26 DIAGNOSIS — J9611 Chronic respiratory failure with hypoxia: Secondary | ICD-10-CM | POA: Diagnosis not present

## 2015-09-28 DIAGNOSIS — I2510 Atherosclerotic heart disease of native coronary artery without angina pectoris: Secondary | ICD-10-CM | POA: Diagnosis not present

## 2015-09-28 DIAGNOSIS — I82401 Acute embolism and thrombosis of unspecified deep veins of right lower extremity: Secondary | ICD-10-CM | POA: Diagnosis not present

## 2015-09-28 DIAGNOSIS — E440 Moderate protein-calorie malnutrition: Secondary | ICD-10-CM | POA: Diagnosis not present

## 2015-09-28 DIAGNOSIS — C7951 Secondary malignant neoplasm of bone: Secondary | ICD-10-CM | POA: Diagnosis not present

## 2015-09-28 DIAGNOSIS — L89154 Pressure ulcer of sacral region, stage 4: Secondary | ICD-10-CM | POA: Diagnosis not present

## 2015-09-28 DIAGNOSIS — N184 Chronic kidney disease, stage 4 (severe): Secondary | ICD-10-CM | POA: Diagnosis not present

## 2015-09-28 DIAGNOSIS — C3490 Malignant neoplasm of unspecified part of unspecified bronchus or lung: Secondary | ICD-10-CM | POA: Diagnosis not present

## 2015-09-28 DIAGNOSIS — I5032 Chronic diastolic (congestive) heart failure: Secondary | ICD-10-CM | POA: Diagnosis not present

## 2015-09-28 DIAGNOSIS — J9611 Chronic respiratory failure with hypoxia: Secondary | ICD-10-CM | POA: Diagnosis not present

## 2015-09-28 DIAGNOSIS — D649 Anemia, unspecified: Secondary | ICD-10-CM | POA: Diagnosis not present

## 2015-09-28 DIAGNOSIS — G061 Intraspinal abscess and granuloma: Secondary | ICD-10-CM | POA: Diagnosis not present

## 2015-09-28 DIAGNOSIS — I4891 Unspecified atrial fibrillation: Secondary | ICD-10-CM | POA: Diagnosis not present

## 2015-09-28 DIAGNOSIS — I130 Hypertensive heart and chronic kidney disease with heart failure and stage 1 through stage 4 chronic kidney disease, or unspecified chronic kidney disease: Secondary | ICD-10-CM | POA: Diagnosis not present

## 2015-09-29 DIAGNOSIS — I5032 Chronic diastolic (congestive) heart failure: Secondary | ICD-10-CM | POA: Diagnosis not present

## 2015-09-29 DIAGNOSIS — I82401 Acute embolism and thrombosis of unspecified deep veins of right lower extremity: Secondary | ICD-10-CM | POA: Diagnosis not present

## 2015-09-29 DIAGNOSIS — E440 Moderate protein-calorie malnutrition: Secondary | ICD-10-CM | POA: Diagnosis not present

## 2015-09-29 DIAGNOSIS — I2510 Atherosclerotic heart disease of native coronary artery without angina pectoris: Secondary | ICD-10-CM | POA: Diagnosis not present

## 2015-09-29 DIAGNOSIS — L89154 Pressure ulcer of sacral region, stage 4: Secondary | ICD-10-CM | POA: Diagnosis not present

## 2015-09-29 DIAGNOSIS — C7951 Secondary malignant neoplasm of bone: Secondary | ICD-10-CM | POA: Diagnosis not present

## 2015-09-29 DIAGNOSIS — J9611 Chronic respiratory failure with hypoxia: Secondary | ICD-10-CM | POA: Diagnosis not present

## 2015-09-29 DIAGNOSIS — I130 Hypertensive heart and chronic kidney disease with heart failure and stage 1 through stage 4 chronic kidney disease, or unspecified chronic kidney disease: Secondary | ICD-10-CM | POA: Diagnosis not present

## 2015-09-29 DIAGNOSIS — C3490 Malignant neoplasm of unspecified part of unspecified bronchus or lung: Secondary | ICD-10-CM | POA: Diagnosis not present

## 2015-09-29 DIAGNOSIS — N184 Chronic kidney disease, stage 4 (severe): Secondary | ICD-10-CM | POA: Diagnosis not present

## 2015-09-29 DIAGNOSIS — G061 Intraspinal abscess and granuloma: Secondary | ICD-10-CM | POA: Diagnosis not present

## 2015-09-29 DIAGNOSIS — D649 Anemia, unspecified: Secondary | ICD-10-CM | POA: Diagnosis not present

## 2015-09-29 DIAGNOSIS — I4891 Unspecified atrial fibrillation: Secondary | ICD-10-CM | POA: Diagnosis not present

## 2015-10-02 DIAGNOSIS — I4891 Unspecified atrial fibrillation: Secondary | ICD-10-CM | POA: Diagnosis not present

## 2015-10-02 DIAGNOSIS — N184 Chronic kidney disease, stage 4 (severe): Secondary | ICD-10-CM | POA: Diagnosis not present

## 2015-10-02 DIAGNOSIS — I130 Hypertensive heart and chronic kidney disease with heart failure and stage 1 through stage 4 chronic kidney disease, or unspecified chronic kidney disease: Secondary | ICD-10-CM | POA: Diagnosis not present

## 2015-10-02 DIAGNOSIS — G061 Intraspinal abscess and granuloma: Secondary | ICD-10-CM | POA: Diagnosis not present

## 2015-10-02 DIAGNOSIS — C3490 Malignant neoplasm of unspecified part of unspecified bronchus or lung: Secondary | ICD-10-CM | POA: Diagnosis not present

## 2015-10-02 DIAGNOSIS — L89154 Pressure ulcer of sacral region, stage 4: Secondary | ICD-10-CM | POA: Diagnosis not present

## 2015-10-02 DIAGNOSIS — I5032 Chronic diastolic (congestive) heart failure: Secondary | ICD-10-CM | POA: Diagnosis not present

## 2015-10-02 DIAGNOSIS — I2510 Atherosclerotic heart disease of native coronary artery without angina pectoris: Secondary | ICD-10-CM | POA: Diagnosis not present

## 2015-10-02 DIAGNOSIS — I82401 Acute embolism and thrombosis of unspecified deep veins of right lower extremity: Secondary | ICD-10-CM | POA: Diagnosis not present

## 2015-10-02 DIAGNOSIS — C7951 Secondary malignant neoplasm of bone: Secondary | ICD-10-CM | POA: Diagnosis not present

## 2015-10-02 DIAGNOSIS — J9611 Chronic respiratory failure with hypoxia: Secondary | ICD-10-CM | POA: Diagnosis not present

## 2015-10-02 DIAGNOSIS — E440 Moderate protein-calorie malnutrition: Secondary | ICD-10-CM | POA: Diagnosis not present

## 2015-10-02 DIAGNOSIS — D649 Anemia, unspecified: Secondary | ICD-10-CM | POA: Diagnosis not present

## 2015-10-04 DIAGNOSIS — L89154 Pressure ulcer of sacral region, stage 4: Secondary | ICD-10-CM | POA: Diagnosis not present

## 2015-10-04 DIAGNOSIS — N184 Chronic kidney disease, stage 4 (severe): Secondary | ICD-10-CM | POA: Diagnosis not present

## 2015-10-04 DIAGNOSIS — I2510 Atherosclerotic heart disease of native coronary artery without angina pectoris: Secondary | ICD-10-CM | POA: Diagnosis not present

## 2015-10-04 DIAGNOSIS — G061 Intraspinal abscess and granuloma: Secondary | ICD-10-CM | POA: Diagnosis not present

## 2015-10-04 DIAGNOSIS — D649 Anemia, unspecified: Secondary | ICD-10-CM | POA: Diagnosis not present

## 2015-10-04 DIAGNOSIS — I5032 Chronic diastolic (congestive) heart failure: Secondary | ICD-10-CM | POA: Diagnosis not present

## 2015-10-04 DIAGNOSIS — I4891 Unspecified atrial fibrillation: Secondary | ICD-10-CM | POA: Diagnosis not present

## 2015-10-04 DIAGNOSIS — C3490 Malignant neoplasm of unspecified part of unspecified bronchus or lung: Secondary | ICD-10-CM | POA: Diagnosis not present

## 2015-10-04 DIAGNOSIS — E440 Moderate protein-calorie malnutrition: Secondary | ICD-10-CM | POA: Diagnosis not present

## 2015-10-04 DIAGNOSIS — C7951 Secondary malignant neoplasm of bone: Secondary | ICD-10-CM | POA: Diagnosis not present

## 2015-10-04 DIAGNOSIS — J9611 Chronic respiratory failure with hypoxia: Secondary | ICD-10-CM | POA: Diagnosis not present

## 2015-10-04 DIAGNOSIS — I130 Hypertensive heart and chronic kidney disease with heart failure and stage 1 through stage 4 chronic kidney disease, or unspecified chronic kidney disease: Secondary | ICD-10-CM | POA: Diagnosis not present

## 2015-10-04 DIAGNOSIS — I82401 Acute embolism and thrombosis of unspecified deep veins of right lower extremity: Secondary | ICD-10-CM | POA: Diagnosis not present

## 2015-10-05 DIAGNOSIS — N184 Chronic kidney disease, stage 4 (severe): Secondary | ICD-10-CM | POA: Diagnosis not present

## 2015-10-05 DIAGNOSIS — J9611 Chronic respiratory failure with hypoxia: Secondary | ICD-10-CM | POA: Diagnosis not present

## 2015-10-05 DIAGNOSIS — G061 Intraspinal abscess and granuloma: Secondary | ICD-10-CM | POA: Diagnosis not present

## 2015-10-05 DIAGNOSIS — L89154 Pressure ulcer of sacral region, stage 4: Secondary | ICD-10-CM | POA: Diagnosis not present

## 2015-10-05 DIAGNOSIS — E440 Moderate protein-calorie malnutrition: Secondary | ICD-10-CM | POA: Diagnosis not present

## 2015-10-05 DIAGNOSIS — C7951 Secondary malignant neoplasm of bone: Secondary | ICD-10-CM | POA: Diagnosis not present

## 2015-10-05 DIAGNOSIS — I4891 Unspecified atrial fibrillation: Secondary | ICD-10-CM | POA: Diagnosis not present

## 2015-10-05 DIAGNOSIS — I5032 Chronic diastolic (congestive) heart failure: Secondary | ICD-10-CM | POA: Diagnosis not present

## 2015-10-05 DIAGNOSIS — D649 Anemia, unspecified: Secondary | ICD-10-CM | POA: Diagnosis not present

## 2015-10-05 DIAGNOSIS — I2510 Atherosclerotic heart disease of native coronary artery without angina pectoris: Secondary | ICD-10-CM | POA: Diagnosis not present

## 2015-10-05 DIAGNOSIS — I130 Hypertensive heart and chronic kidney disease with heart failure and stage 1 through stage 4 chronic kidney disease, or unspecified chronic kidney disease: Secondary | ICD-10-CM | POA: Diagnosis not present

## 2015-10-05 DIAGNOSIS — C3490 Malignant neoplasm of unspecified part of unspecified bronchus or lung: Secondary | ICD-10-CM | POA: Diagnosis not present

## 2015-10-05 DIAGNOSIS — I82401 Acute embolism and thrombosis of unspecified deep veins of right lower extremity: Secondary | ICD-10-CM | POA: Diagnosis not present

## 2015-10-06 DIAGNOSIS — I130 Hypertensive heart and chronic kidney disease with heart failure and stage 1 through stage 4 chronic kidney disease, or unspecified chronic kidney disease: Secondary | ICD-10-CM | POA: Diagnosis not present

## 2015-10-06 DIAGNOSIS — J9611 Chronic respiratory failure with hypoxia: Secondary | ICD-10-CM | POA: Diagnosis not present

## 2015-10-06 DIAGNOSIS — G061 Intraspinal abscess and granuloma: Secondary | ICD-10-CM | POA: Diagnosis not present

## 2015-10-06 DIAGNOSIS — C3490 Malignant neoplasm of unspecified part of unspecified bronchus or lung: Secondary | ICD-10-CM | POA: Diagnosis not present

## 2015-10-06 DIAGNOSIS — I2510 Atherosclerotic heart disease of native coronary artery without angina pectoris: Secondary | ICD-10-CM | POA: Diagnosis not present

## 2015-10-06 DIAGNOSIS — D649 Anemia, unspecified: Secondary | ICD-10-CM | POA: Diagnosis not present

## 2015-10-06 DIAGNOSIS — L89154 Pressure ulcer of sacral region, stage 4: Secondary | ICD-10-CM | POA: Diagnosis not present

## 2015-10-06 DIAGNOSIS — I4891 Unspecified atrial fibrillation: Secondary | ICD-10-CM | POA: Diagnosis not present

## 2015-10-06 DIAGNOSIS — I82401 Acute embolism and thrombosis of unspecified deep veins of right lower extremity: Secondary | ICD-10-CM | POA: Diagnosis not present

## 2015-10-06 DIAGNOSIS — N184 Chronic kidney disease, stage 4 (severe): Secondary | ICD-10-CM | POA: Diagnosis not present

## 2015-10-06 DIAGNOSIS — C7951 Secondary malignant neoplasm of bone: Secondary | ICD-10-CM | POA: Diagnosis not present

## 2015-10-06 DIAGNOSIS — E440 Moderate protein-calorie malnutrition: Secondary | ICD-10-CM | POA: Diagnosis not present

## 2015-10-06 DIAGNOSIS — I5032 Chronic diastolic (congestive) heart failure: Secondary | ICD-10-CM | POA: Diagnosis not present

## 2015-10-09 DIAGNOSIS — I130 Hypertensive heart and chronic kidney disease with heart failure and stage 1 through stage 4 chronic kidney disease, or unspecified chronic kidney disease: Secondary | ICD-10-CM | POA: Diagnosis not present

## 2015-10-09 DIAGNOSIS — C3490 Malignant neoplasm of unspecified part of unspecified bronchus or lung: Secondary | ICD-10-CM | POA: Diagnosis not present

## 2015-10-09 DIAGNOSIS — I82401 Acute embolism and thrombosis of unspecified deep veins of right lower extremity: Secondary | ICD-10-CM | POA: Diagnosis not present

## 2015-10-09 DIAGNOSIS — D638 Anemia in other chronic diseases classified elsewhere: Secondary | ICD-10-CM | POA: Diagnosis not present

## 2015-10-09 DIAGNOSIS — I2510 Atherosclerotic heart disease of native coronary artery without angina pectoris: Secondary | ICD-10-CM | POA: Diagnosis not present

## 2015-10-09 DIAGNOSIS — I5032 Chronic diastolic (congestive) heart failure: Secondary | ICD-10-CM | POA: Diagnosis not present

## 2015-10-09 DIAGNOSIS — C7951 Secondary malignant neoplasm of bone: Secondary | ICD-10-CM | POA: Diagnosis not present

## 2015-10-09 DIAGNOSIS — N183 Chronic kidney disease, stage 3 (moderate): Secondary | ICD-10-CM | POA: Diagnosis not present

## 2015-10-09 DIAGNOSIS — G8311 Monoplegia of lower limb affecting right dominant side: Secondary | ICD-10-CM | POA: Diagnosis not present

## 2015-10-09 DIAGNOSIS — E440 Moderate protein-calorie malnutrition: Secondary | ICD-10-CM | POA: Diagnosis not present

## 2015-10-09 DIAGNOSIS — L89154 Pressure ulcer of sacral region, stage 4: Secondary | ICD-10-CM | POA: Diagnosis not present

## 2015-10-09 DIAGNOSIS — I4891 Unspecified atrial fibrillation: Secondary | ICD-10-CM | POA: Diagnosis not present

## 2015-10-10 DIAGNOSIS — C3490 Malignant neoplasm of unspecified part of unspecified bronchus or lung: Secondary | ICD-10-CM | POA: Diagnosis not present

## 2015-10-10 DIAGNOSIS — I4891 Unspecified atrial fibrillation: Secondary | ICD-10-CM | POA: Diagnosis not present

## 2015-10-10 DIAGNOSIS — N183 Chronic kidney disease, stage 3 (moderate): Secondary | ICD-10-CM | POA: Diagnosis not present

## 2015-10-10 DIAGNOSIS — I5032 Chronic diastolic (congestive) heart failure: Secondary | ICD-10-CM | POA: Diagnosis not present

## 2015-10-10 DIAGNOSIS — D638 Anemia in other chronic diseases classified elsewhere: Secondary | ICD-10-CM | POA: Diagnosis not present

## 2015-10-10 DIAGNOSIS — G8311 Monoplegia of lower limb affecting right dominant side: Secondary | ICD-10-CM | POA: Diagnosis not present

## 2015-10-10 DIAGNOSIS — I130 Hypertensive heart and chronic kidney disease with heart failure and stage 1 through stage 4 chronic kidney disease, or unspecified chronic kidney disease: Secondary | ICD-10-CM | POA: Diagnosis not present

## 2015-10-10 DIAGNOSIS — I2510 Atherosclerotic heart disease of native coronary artery without angina pectoris: Secondary | ICD-10-CM | POA: Diagnosis not present

## 2015-10-10 DIAGNOSIS — C7951 Secondary malignant neoplasm of bone: Secondary | ICD-10-CM | POA: Diagnosis not present

## 2015-10-10 DIAGNOSIS — L89154 Pressure ulcer of sacral region, stage 4: Secondary | ICD-10-CM | POA: Diagnosis not present

## 2015-10-10 DIAGNOSIS — E440 Moderate protein-calorie malnutrition: Secondary | ICD-10-CM | POA: Diagnosis not present

## 2015-10-10 DIAGNOSIS — I82401 Acute embolism and thrombosis of unspecified deep veins of right lower extremity: Secondary | ICD-10-CM | POA: Diagnosis not present

## 2015-10-12 DIAGNOSIS — I130 Hypertensive heart and chronic kidney disease with heart failure and stage 1 through stage 4 chronic kidney disease, or unspecified chronic kidney disease: Secondary | ICD-10-CM | POA: Diagnosis not present

## 2015-10-12 DIAGNOSIS — I82401 Acute embolism and thrombosis of unspecified deep veins of right lower extremity: Secondary | ICD-10-CM | POA: Diagnosis not present

## 2015-10-12 DIAGNOSIS — C3490 Malignant neoplasm of unspecified part of unspecified bronchus or lung: Secondary | ICD-10-CM | POA: Diagnosis not present

## 2015-10-12 DIAGNOSIS — C7951 Secondary malignant neoplasm of bone: Secondary | ICD-10-CM | POA: Diagnosis not present

## 2015-10-12 DIAGNOSIS — E440 Moderate protein-calorie malnutrition: Secondary | ICD-10-CM | POA: Diagnosis not present

## 2015-10-12 DIAGNOSIS — I4891 Unspecified atrial fibrillation: Secondary | ICD-10-CM | POA: Diagnosis not present

## 2015-10-12 DIAGNOSIS — G8311 Monoplegia of lower limb affecting right dominant side: Secondary | ICD-10-CM | POA: Diagnosis not present

## 2015-10-12 DIAGNOSIS — N183 Chronic kidney disease, stage 3 (moderate): Secondary | ICD-10-CM | POA: Diagnosis not present

## 2015-10-12 DIAGNOSIS — D638 Anemia in other chronic diseases classified elsewhere: Secondary | ICD-10-CM | POA: Diagnosis not present

## 2015-10-12 DIAGNOSIS — L89154 Pressure ulcer of sacral region, stage 4: Secondary | ICD-10-CM | POA: Diagnosis not present

## 2015-10-12 DIAGNOSIS — I2510 Atherosclerotic heart disease of native coronary artery without angina pectoris: Secondary | ICD-10-CM | POA: Diagnosis not present

## 2015-10-12 DIAGNOSIS — I5032 Chronic diastolic (congestive) heart failure: Secondary | ICD-10-CM | POA: Diagnosis not present

## 2015-10-13 DIAGNOSIS — D638 Anemia in other chronic diseases classified elsewhere: Secondary | ICD-10-CM | POA: Diagnosis not present

## 2015-10-13 DIAGNOSIS — I2510 Atherosclerotic heart disease of native coronary artery without angina pectoris: Secondary | ICD-10-CM | POA: Diagnosis not present

## 2015-10-13 DIAGNOSIS — I4891 Unspecified atrial fibrillation: Secondary | ICD-10-CM | POA: Diagnosis not present

## 2015-10-13 DIAGNOSIS — C7951 Secondary malignant neoplasm of bone: Secondary | ICD-10-CM | POA: Diagnosis not present

## 2015-10-13 DIAGNOSIS — I82401 Acute embolism and thrombosis of unspecified deep veins of right lower extremity: Secondary | ICD-10-CM | POA: Diagnosis not present

## 2015-10-13 DIAGNOSIS — G8311 Monoplegia of lower limb affecting right dominant side: Secondary | ICD-10-CM | POA: Diagnosis not present

## 2015-10-13 DIAGNOSIS — I130 Hypertensive heart and chronic kidney disease with heart failure and stage 1 through stage 4 chronic kidney disease, or unspecified chronic kidney disease: Secondary | ICD-10-CM | POA: Diagnosis not present

## 2015-10-13 DIAGNOSIS — C3490 Malignant neoplasm of unspecified part of unspecified bronchus or lung: Secondary | ICD-10-CM | POA: Diagnosis not present

## 2015-10-13 DIAGNOSIS — N183 Chronic kidney disease, stage 3 (moderate): Secondary | ICD-10-CM | POA: Diagnosis not present

## 2015-10-13 DIAGNOSIS — E440 Moderate protein-calorie malnutrition: Secondary | ICD-10-CM | POA: Diagnosis not present

## 2015-10-13 DIAGNOSIS — I5032 Chronic diastolic (congestive) heart failure: Secondary | ICD-10-CM | POA: Diagnosis not present

## 2015-10-13 DIAGNOSIS — L89154 Pressure ulcer of sacral region, stage 4: Secondary | ICD-10-CM | POA: Diagnosis not present

## 2015-10-17 DIAGNOSIS — D638 Anemia in other chronic diseases classified elsewhere: Secondary | ICD-10-CM | POA: Diagnosis not present

## 2015-10-17 DIAGNOSIS — L89154 Pressure ulcer of sacral region, stage 4: Secondary | ICD-10-CM | POA: Diagnosis not present

## 2015-10-17 DIAGNOSIS — I4891 Unspecified atrial fibrillation: Secondary | ICD-10-CM | POA: Diagnosis not present

## 2015-10-17 DIAGNOSIS — I82401 Acute embolism and thrombosis of unspecified deep veins of right lower extremity: Secondary | ICD-10-CM | POA: Diagnosis not present

## 2015-10-17 DIAGNOSIS — C7951 Secondary malignant neoplasm of bone: Secondary | ICD-10-CM | POA: Diagnosis not present

## 2015-10-17 DIAGNOSIS — I5032 Chronic diastolic (congestive) heart failure: Secondary | ICD-10-CM | POA: Diagnosis not present

## 2015-10-17 DIAGNOSIS — N183 Chronic kidney disease, stage 3 (moderate): Secondary | ICD-10-CM | POA: Diagnosis not present

## 2015-10-17 DIAGNOSIS — I130 Hypertensive heart and chronic kidney disease with heart failure and stage 1 through stage 4 chronic kidney disease, or unspecified chronic kidney disease: Secondary | ICD-10-CM | POA: Diagnosis not present

## 2015-10-17 DIAGNOSIS — G8311 Monoplegia of lower limb affecting right dominant side: Secondary | ICD-10-CM | POA: Diagnosis not present

## 2015-10-17 DIAGNOSIS — C3490 Malignant neoplasm of unspecified part of unspecified bronchus or lung: Secondary | ICD-10-CM | POA: Diagnosis not present

## 2015-10-17 DIAGNOSIS — I2510 Atherosclerotic heart disease of native coronary artery without angina pectoris: Secondary | ICD-10-CM | POA: Diagnosis not present

## 2015-10-17 DIAGNOSIS — E440 Moderate protein-calorie malnutrition: Secondary | ICD-10-CM | POA: Diagnosis not present

## 2015-10-19 DIAGNOSIS — I5032 Chronic diastolic (congestive) heart failure: Secondary | ICD-10-CM | POA: Diagnosis not present

## 2015-10-19 DIAGNOSIS — I2510 Atherosclerotic heart disease of native coronary artery without angina pectoris: Secondary | ICD-10-CM | POA: Diagnosis not present

## 2015-10-19 DIAGNOSIS — L89154 Pressure ulcer of sacral region, stage 4: Secondary | ICD-10-CM | POA: Diagnosis not present

## 2015-10-19 DIAGNOSIS — D638 Anemia in other chronic diseases classified elsewhere: Secondary | ICD-10-CM | POA: Diagnosis not present

## 2015-10-19 DIAGNOSIS — I130 Hypertensive heart and chronic kidney disease with heart failure and stage 1 through stage 4 chronic kidney disease, or unspecified chronic kidney disease: Secondary | ICD-10-CM | POA: Diagnosis not present

## 2015-10-19 DIAGNOSIS — C3490 Malignant neoplasm of unspecified part of unspecified bronchus or lung: Secondary | ICD-10-CM | POA: Diagnosis not present

## 2015-10-19 DIAGNOSIS — C7951 Secondary malignant neoplasm of bone: Secondary | ICD-10-CM | POA: Diagnosis not present

## 2015-10-19 DIAGNOSIS — I82401 Acute embolism and thrombosis of unspecified deep veins of right lower extremity: Secondary | ICD-10-CM | POA: Diagnosis not present

## 2015-10-19 DIAGNOSIS — I4891 Unspecified atrial fibrillation: Secondary | ICD-10-CM | POA: Diagnosis not present

## 2015-10-19 DIAGNOSIS — G8311 Monoplegia of lower limb affecting right dominant side: Secondary | ICD-10-CM | POA: Diagnosis not present

## 2015-10-19 DIAGNOSIS — E440 Moderate protein-calorie malnutrition: Secondary | ICD-10-CM | POA: Diagnosis not present

## 2015-10-19 DIAGNOSIS — N183 Chronic kidney disease, stage 3 (moderate): Secondary | ICD-10-CM | POA: Diagnosis not present

## 2015-10-20 DIAGNOSIS — L89154 Pressure ulcer of sacral region, stage 4: Secondary | ICD-10-CM | POA: Diagnosis not present

## 2015-10-20 DIAGNOSIS — I130 Hypertensive heart and chronic kidney disease with heart failure and stage 1 through stage 4 chronic kidney disease, or unspecified chronic kidney disease: Secondary | ICD-10-CM | POA: Diagnosis not present

## 2015-10-20 DIAGNOSIS — C7951 Secondary malignant neoplasm of bone: Secondary | ICD-10-CM | POA: Diagnosis not present

## 2015-10-20 DIAGNOSIS — I4891 Unspecified atrial fibrillation: Secondary | ICD-10-CM | POA: Diagnosis not present

## 2015-10-20 DIAGNOSIS — I2510 Atherosclerotic heart disease of native coronary artery without angina pectoris: Secondary | ICD-10-CM | POA: Diagnosis not present

## 2015-10-20 DIAGNOSIS — D638 Anemia in other chronic diseases classified elsewhere: Secondary | ICD-10-CM | POA: Diagnosis not present

## 2015-10-20 DIAGNOSIS — E440 Moderate protein-calorie malnutrition: Secondary | ICD-10-CM | POA: Diagnosis not present

## 2015-10-20 DIAGNOSIS — I5032 Chronic diastolic (congestive) heart failure: Secondary | ICD-10-CM | POA: Diagnosis not present

## 2015-10-20 DIAGNOSIS — C3490 Malignant neoplasm of unspecified part of unspecified bronchus or lung: Secondary | ICD-10-CM | POA: Diagnosis not present

## 2015-10-20 DIAGNOSIS — N183 Chronic kidney disease, stage 3 (moderate): Secondary | ICD-10-CM | POA: Diagnosis not present

## 2015-10-20 DIAGNOSIS — I82401 Acute embolism and thrombosis of unspecified deep veins of right lower extremity: Secondary | ICD-10-CM | POA: Diagnosis not present

## 2015-10-20 DIAGNOSIS — G8311 Monoplegia of lower limb affecting right dominant side: Secondary | ICD-10-CM | POA: Diagnosis not present

## 2015-10-21 DIAGNOSIS — R222 Localized swelling, mass and lump, trunk: Secondary | ICD-10-CM | POA: Diagnosis not present

## 2015-10-21 DIAGNOSIS — L89154 Pressure ulcer of sacral region, stage 4: Secondary | ICD-10-CM | POA: Diagnosis not present

## 2015-10-23 ENCOUNTER — Telehealth: Payer: Self-pay | Admitting: Cardiovascular Disease

## 2015-10-23 DIAGNOSIS — G8311 Monoplegia of lower limb affecting right dominant side: Secondary | ICD-10-CM | POA: Diagnosis not present

## 2015-10-23 DIAGNOSIS — C7951 Secondary malignant neoplasm of bone: Secondary | ICD-10-CM | POA: Diagnosis not present

## 2015-10-23 DIAGNOSIS — N183 Chronic kidney disease, stage 3 (moderate): Secondary | ICD-10-CM | POA: Diagnosis not present

## 2015-10-23 DIAGNOSIS — I4891 Unspecified atrial fibrillation: Secondary | ICD-10-CM | POA: Diagnosis not present

## 2015-10-23 DIAGNOSIS — I5032 Chronic diastolic (congestive) heart failure: Secondary | ICD-10-CM | POA: Diagnosis not present

## 2015-10-23 DIAGNOSIS — L89154 Pressure ulcer of sacral region, stage 4: Secondary | ICD-10-CM | POA: Diagnosis not present

## 2015-10-23 DIAGNOSIS — E440 Moderate protein-calorie malnutrition: Secondary | ICD-10-CM | POA: Diagnosis not present

## 2015-10-23 DIAGNOSIS — I2510 Atherosclerotic heart disease of native coronary artery without angina pectoris: Secondary | ICD-10-CM | POA: Diagnosis not present

## 2015-10-23 DIAGNOSIS — I82401 Acute embolism and thrombosis of unspecified deep veins of right lower extremity: Secondary | ICD-10-CM | POA: Diagnosis not present

## 2015-10-23 DIAGNOSIS — D638 Anemia in other chronic diseases classified elsewhere: Secondary | ICD-10-CM | POA: Diagnosis not present

## 2015-10-23 DIAGNOSIS — M6281 Muscle weakness (generalized): Secondary | ICD-10-CM | POA: Diagnosis not present

## 2015-10-23 DIAGNOSIS — I130 Hypertensive heart and chronic kidney disease with heart failure and stage 1 through stage 4 chronic kidney disease, or unspecified chronic kidney disease: Secondary | ICD-10-CM | POA: Diagnosis not present

## 2015-10-23 DIAGNOSIS — I5033 Acute on chronic diastolic (congestive) heart failure: Secondary | ICD-10-CM | POA: Diagnosis not present

## 2015-10-23 DIAGNOSIS — C3490 Malignant neoplasm of unspecified part of unspecified bronchus or lung: Secondary | ICD-10-CM | POA: Diagnosis not present

## 2015-10-23 NOTE — Telephone Encounter (Signed)
Appears that patient is no longer to be taking plavix. Please advise on request. Thanks, MI

## 2015-10-23 NOTE — Telephone Encounter (Signed)
°*  STAT* If patient is at the pharmacy, call can be transferred to refill team.   1. Which medications need to be refilled? (please list name of each medication and dose if known) Plavix  2. Which pharmacy/location (including street and city if local pharmacy) is medication to be sent to?[facility]-[phone]  3. Do they need a 30 day or 90 day supply? [place]

## 2015-10-24 ENCOUNTER — Other Ambulatory Visit: Payer: Self-pay | Admitting: Cardiovascular Disease

## 2015-10-24 DIAGNOSIS — N183 Chronic kidney disease, stage 3 (moderate): Secondary | ICD-10-CM | POA: Diagnosis not present

## 2015-10-24 DIAGNOSIS — E440 Moderate protein-calorie malnutrition: Secondary | ICD-10-CM | POA: Diagnosis not present

## 2015-10-24 DIAGNOSIS — L89154 Pressure ulcer of sacral region, stage 4: Secondary | ICD-10-CM | POA: Diagnosis not present

## 2015-10-24 DIAGNOSIS — I5032 Chronic diastolic (congestive) heart failure: Secondary | ICD-10-CM | POA: Diagnosis not present

## 2015-10-24 DIAGNOSIS — D638 Anemia in other chronic diseases classified elsewhere: Secondary | ICD-10-CM | POA: Diagnosis not present

## 2015-10-24 DIAGNOSIS — I2510 Atherosclerotic heart disease of native coronary artery without angina pectoris: Secondary | ICD-10-CM | POA: Diagnosis not present

## 2015-10-24 DIAGNOSIS — G8311 Monoplegia of lower limb affecting right dominant side: Secondary | ICD-10-CM | POA: Diagnosis not present

## 2015-10-24 DIAGNOSIS — C7951 Secondary malignant neoplasm of bone: Secondary | ICD-10-CM | POA: Diagnosis not present

## 2015-10-24 DIAGNOSIS — I4891 Unspecified atrial fibrillation: Secondary | ICD-10-CM | POA: Diagnosis not present

## 2015-10-24 DIAGNOSIS — C3490 Malignant neoplasm of unspecified part of unspecified bronchus or lung: Secondary | ICD-10-CM | POA: Diagnosis not present

## 2015-10-24 DIAGNOSIS — I130 Hypertensive heart and chronic kidney disease with heart failure and stage 1 through stage 4 chronic kidney disease, or unspecified chronic kidney disease: Secondary | ICD-10-CM | POA: Diagnosis not present

## 2015-10-24 DIAGNOSIS — I82401 Acute embolism and thrombosis of unspecified deep veins of right lower extremity: Secondary | ICD-10-CM | POA: Diagnosis not present

## 2015-10-25 NOTE — Telephone Encounter (Signed)
Plavix was not on the pt's list when she was seen by Dr Burt Knack 04/03/15.  Per 11/29/14 hospital discharge plavix was stopped due to the pt starting lovenox.  Lovenox remains on the pt's medication list.  I will forward this message to Dr Burt Knack to review and determine if the pt should be taking plavix from a cardiac stand point.

## 2015-10-26 DIAGNOSIS — C3490 Malignant neoplasm of unspecified part of unspecified bronchus or lung: Secondary | ICD-10-CM | POA: Diagnosis not present

## 2015-10-26 DIAGNOSIS — C7951 Secondary malignant neoplasm of bone: Secondary | ICD-10-CM | POA: Diagnosis not present

## 2015-10-26 DIAGNOSIS — E440 Moderate protein-calorie malnutrition: Secondary | ICD-10-CM | POA: Diagnosis not present

## 2015-10-26 DIAGNOSIS — N183 Chronic kidney disease, stage 3 (moderate): Secondary | ICD-10-CM | POA: Diagnosis not present

## 2015-10-26 DIAGNOSIS — I2510 Atherosclerotic heart disease of native coronary artery without angina pectoris: Secondary | ICD-10-CM | POA: Diagnosis not present

## 2015-10-26 DIAGNOSIS — I130 Hypertensive heart and chronic kidney disease with heart failure and stage 1 through stage 4 chronic kidney disease, or unspecified chronic kidney disease: Secondary | ICD-10-CM | POA: Diagnosis not present

## 2015-10-26 DIAGNOSIS — I4891 Unspecified atrial fibrillation: Secondary | ICD-10-CM | POA: Diagnosis not present

## 2015-10-26 DIAGNOSIS — L89154 Pressure ulcer of sacral region, stage 4: Secondary | ICD-10-CM | POA: Diagnosis not present

## 2015-10-26 DIAGNOSIS — D638 Anemia in other chronic diseases classified elsewhere: Secondary | ICD-10-CM | POA: Diagnosis not present

## 2015-10-26 DIAGNOSIS — I5032 Chronic diastolic (congestive) heart failure: Secondary | ICD-10-CM | POA: Diagnosis not present

## 2015-10-26 DIAGNOSIS — G8311 Monoplegia of lower limb affecting right dominant side: Secondary | ICD-10-CM | POA: Diagnosis not present

## 2015-10-26 DIAGNOSIS — I82401 Acute embolism and thrombosis of unspecified deep veins of right lower extremity: Secondary | ICD-10-CM | POA: Diagnosis not present

## 2015-10-31 ENCOUNTER — Other Ambulatory Visit: Payer: Self-pay | Admitting: Cardiovascular Disease

## 2015-10-31 DIAGNOSIS — I82401 Acute embolism and thrombosis of unspecified deep veins of right lower extremity: Secondary | ICD-10-CM | POA: Diagnosis not present

## 2015-10-31 DIAGNOSIS — I2510 Atherosclerotic heart disease of native coronary artery without angina pectoris: Secondary | ICD-10-CM | POA: Diagnosis not present

## 2015-10-31 DIAGNOSIS — I4891 Unspecified atrial fibrillation: Secondary | ICD-10-CM | POA: Diagnosis not present

## 2015-10-31 DIAGNOSIS — D638 Anemia in other chronic diseases classified elsewhere: Secondary | ICD-10-CM | POA: Diagnosis not present

## 2015-10-31 DIAGNOSIS — C7951 Secondary malignant neoplasm of bone: Secondary | ICD-10-CM | POA: Diagnosis not present

## 2015-10-31 DIAGNOSIS — L89154 Pressure ulcer of sacral region, stage 4: Secondary | ICD-10-CM | POA: Diagnosis not present

## 2015-10-31 DIAGNOSIS — I5032 Chronic diastolic (congestive) heart failure: Secondary | ICD-10-CM | POA: Diagnosis not present

## 2015-10-31 DIAGNOSIS — I130 Hypertensive heart and chronic kidney disease with heart failure and stage 1 through stage 4 chronic kidney disease, or unspecified chronic kidney disease: Secondary | ICD-10-CM | POA: Diagnosis not present

## 2015-10-31 DIAGNOSIS — N183 Chronic kidney disease, stage 3 (moderate): Secondary | ICD-10-CM | POA: Diagnosis not present

## 2015-10-31 DIAGNOSIS — C3490 Malignant neoplasm of unspecified part of unspecified bronchus or lung: Secondary | ICD-10-CM | POA: Diagnosis not present

## 2015-10-31 DIAGNOSIS — G8311 Monoplegia of lower limb affecting right dominant side: Secondary | ICD-10-CM | POA: Diagnosis not present

## 2015-10-31 DIAGNOSIS — E440 Moderate protein-calorie malnutrition: Secondary | ICD-10-CM | POA: Diagnosis not present

## 2015-10-31 NOTE — Telephone Encounter (Signed)
Chart reviewed. With chronic anticoagulation, plavix should be discontinued.

## 2015-11-01 ENCOUNTER — Telehealth: Payer: Self-pay | Admitting: Cardiovascular Disease

## 2015-11-01 NOTE — Telephone Encounter (Signed)
I spoke with the pt and she has been taking Lovenox due to blood clots.  The pt has taken Xarelto in the past but this caused bleeding in her kidneys.  The pt said the pharmacist recommended she try plavix.  I  made her aware that plavix is not indicated for treatment of blood clots. I advised her that she will needs to contact Dr Juel Burrow office to discuss alternative anticoagulant drugs and to have lovenox refilled.  I also scheduled the pt for ROV with Dr Burt Knack. Pt agreed with plan.

## 2015-11-01 NOTE — Telephone Encounter (Signed)
°*  STAT* If patient is at the pharmacy, call can be transferred to refill team.   1. Which medications need to be refilled? (please list name of each medication and dose if known) Lovenox  2. Which pharmacy/location (including street and city if local pharmacy) is medication to be sent to?[facility]  3. Do they need a 30 day or 90 day supply? [place]

## 2015-11-01 NOTE — Telephone Encounter (Signed)
This Rx needs to be filled by the pt's PCP who is treating the pt.  I called Dr Juel Burrow office and they still have a message on answering machine that they are closed for [facility] Day and will reopen 10/31/15 at 8 AM and then the line cuts off. I spoke with the pt's daughter and made her aware of this information and she will continue to attempt to reach Dr Juel Burrow office in regards Lovenox refill.

## 2015-11-03 DIAGNOSIS — L89154 Pressure ulcer of sacral region, stage 4: Secondary | ICD-10-CM | POA: Diagnosis not present

## 2015-11-03 DIAGNOSIS — I130 Hypertensive heart and chronic kidney disease with heart failure and stage 1 through stage 4 chronic kidney disease, or unspecified chronic kidney disease: Secondary | ICD-10-CM | POA: Diagnosis not present

## 2015-11-03 DIAGNOSIS — D638 Anemia in other chronic diseases classified elsewhere: Secondary | ICD-10-CM | POA: Diagnosis not present

## 2015-11-03 DIAGNOSIS — C7951 Secondary malignant neoplasm of bone: Secondary | ICD-10-CM | POA: Diagnosis not present

## 2015-11-03 DIAGNOSIS — E440 Moderate protein-calorie malnutrition: Secondary | ICD-10-CM | POA: Diagnosis not present

## 2015-11-03 DIAGNOSIS — C3490 Malignant neoplasm of unspecified part of unspecified bronchus or lung: Secondary | ICD-10-CM | POA: Diagnosis not present

## 2015-11-03 DIAGNOSIS — I5032 Chronic diastolic (congestive) heart failure: Secondary | ICD-10-CM | POA: Diagnosis not present

## 2015-11-03 DIAGNOSIS — N183 Chronic kidney disease, stage 3 (moderate): Secondary | ICD-10-CM | POA: Diagnosis not present

## 2015-11-03 DIAGNOSIS — G8311 Monoplegia of lower limb affecting right dominant side: Secondary | ICD-10-CM | POA: Diagnosis not present

## 2015-11-03 DIAGNOSIS — I82401 Acute embolism and thrombosis of unspecified deep veins of right lower extremity: Secondary | ICD-10-CM | POA: Diagnosis not present

## 2015-11-03 DIAGNOSIS — I2510 Atherosclerotic heart disease of native coronary artery without angina pectoris: Secondary | ICD-10-CM | POA: Diagnosis not present

## 2015-11-03 DIAGNOSIS — I4891 Unspecified atrial fibrillation: Secondary | ICD-10-CM | POA: Diagnosis not present

## 2015-11-05 DIAGNOSIS — I5032 Chronic diastolic (congestive) heart failure: Secondary | ICD-10-CM | POA: Diagnosis not present

## 2015-11-05 DIAGNOSIS — L89154 Pressure ulcer of sacral region, stage 4: Secondary | ICD-10-CM | POA: Diagnosis not present

## 2015-11-05 DIAGNOSIS — I82401 Acute embolism and thrombosis of unspecified deep veins of right lower extremity: Secondary | ICD-10-CM | POA: Diagnosis not present

## 2015-11-05 DIAGNOSIS — C3490 Malignant neoplasm of unspecified part of unspecified bronchus or lung: Secondary | ICD-10-CM | POA: Diagnosis not present

## 2015-11-05 DIAGNOSIS — I130 Hypertensive heart and chronic kidney disease with heart failure and stage 1 through stage 4 chronic kidney disease, or unspecified chronic kidney disease: Secondary | ICD-10-CM | POA: Diagnosis not present

## 2015-11-05 DIAGNOSIS — E440 Moderate protein-calorie malnutrition: Secondary | ICD-10-CM | POA: Diagnosis not present

## 2015-11-05 DIAGNOSIS — N183 Chronic kidney disease, stage 3 (moderate): Secondary | ICD-10-CM | POA: Diagnosis not present

## 2015-11-05 DIAGNOSIS — D638 Anemia in other chronic diseases classified elsewhere: Secondary | ICD-10-CM | POA: Diagnosis not present

## 2015-11-05 DIAGNOSIS — C7951 Secondary malignant neoplasm of bone: Secondary | ICD-10-CM | POA: Diagnosis not present

## 2015-11-05 DIAGNOSIS — G8311 Monoplegia of lower limb affecting right dominant side: Secondary | ICD-10-CM | POA: Diagnosis not present

## 2015-11-05 DIAGNOSIS — I2510 Atherosclerotic heart disease of native coronary artery without angina pectoris: Secondary | ICD-10-CM | POA: Diagnosis not present

## 2015-11-05 DIAGNOSIS — I4891 Unspecified atrial fibrillation: Secondary | ICD-10-CM | POA: Diagnosis not present

## 2015-11-06 DIAGNOSIS — I509 Heart failure, unspecified: Secondary | ICD-10-CM | POA: Diagnosis not present

## 2015-11-06 DIAGNOSIS — D0220 Carcinoma in situ of unspecified bronchus and lung: Secondary | ICD-10-CM | POA: Diagnosis not present

## 2015-11-06 DIAGNOSIS — I8291 Chronic embolism and thrombosis of unspecified vein: Secondary | ICD-10-CM | POA: Diagnosis not present

## 2015-11-06 DIAGNOSIS — L89153 Pressure ulcer of sacral region, stage 3: Secondary | ICD-10-CM | POA: Diagnosis not present

## 2015-11-07 DIAGNOSIS — C3490 Malignant neoplasm of unspecified part of unspecified bronchus or lung: Secondary | ICD-10-CM | POA: Diagnosis not present

## 2015-11-07 DIAGNOSIS — D638 Anemia in other chronic diseases classified elsewhere: Secondary | ICD-10-CM | POA: Diagnosis not present

## 2015-11-07 DIAGNOSIS — N183 Chronic kidney disease, stage 3 (moderate): Secondary | ICD-10-CM | POA: Diagnosis not present

## 2015-11-07 DIAGNOSIS — C7951 Secondary malignant neoplasm of bone: Secondary | ICD-10-CM | POA: Diagnosis not present

## 2015-11-07 DIAGNOSIS — I2510 Atherosclerotic heart disease of native coronary artery without angina pectoris: Secondary | ICD-10-CM | POA: Diagnosis not present

## 2015-11-07 DIAGNOSIS — E440 Moderate protein-calorie malnutrition: Secondary | ICD-10-CM | POA: Diagnosis not present

## 2015-11-07 DIAGNOSIS — I82401 Acute embolism and thrombosis of unspecified deep veins of right lower extremity: Secondary | ICD-10-CM | POA: Diagnosis not present

## 2015-11-07 DIAGNOSIS — I130 Hypertensive heart and chronic kidney disease with heart failure and stage 1 through stage 4 chronic kidney disease, or unspecified chronic kidney disease: Secondary | ICD-10-CM | POA: Diagnosis not present

## 2015-11-07 DIAGNOSIS — L89154 Pressure ulcer of sacral region, stage 4: Secondary | ICD-10-CM | POA: Diagnosis not present

## 2015-11-07 DIAGNOSIS — I4891 Unspecified atrial fibrillation: Secondary | ICD-10-CM | POA: Diagnosis not present

## 2015-11-07 DIAGNOSIS — G8311 Monoplegia of lower limb affecting right dominant side: Secondary | ICD-10-CM | POA: Diagnosis not present

## 2015-11-07 DIAGNOSIS — I5032 Chronic diastolic (congestive) heart failure: Secondary | ICD-10-CM | POA: Diagnosis not present

## 2015-11-09 DIAGNOSIS — L89154 Pressure ulcer of sacral region, stage 4: Secondary | ICD-10-CM | POA: Diagnosis not present

## 2015-11-09 DIAGNOSIS — N183 Chronic kidney disease, stage 3 (moderate): Secondary | ICD-10-CM | POA: Diagnosis not present

## 2015-11-09 DIAGNOSIS — C3490 Malignant neoplasm of unspecified part of unspecified bronchus or lung: Secondary | ICD-10-CM | POA: Diagnosis not present

## 2015-11-09 DIAGNOSIS — D638 Anemia in other chronic diseases classified elsewhere: Secondary | ICD-10-CM | POA: Diagnosis not present

## 2015-11-09 DIAGNOSIS — I4891 Unspecified atrial fibrillation: Secondary | ICD-10-CM | POA: Diagnosis not present

## 2015-11-09 DIAGNOSIS — G8311 Monoplegia of lower limb affecting right dominant side: Secondary | ICD-10-CM | POA: Diagnosis not present

## 2015-11-09 DIAGNOSIS — I2510 Atherosclerotic heart disease of native coronary artery without angina pectoris: Secondary | ICD-10-CM | POA: Diagnosis not present

## 2015-11-09 DIAGNOSIS — C7951 Secondary malignant neoplasm of bone: Secondary | ICD-10-CM | POA: Diagnosis not present

## 2015-11-09 DIAGNOSIS — E440 Moderate protein-calorie malnutrition: Secondary | ICD-10-CM | POA: Diagnosis not present

## 2015-11-09 DIAGNOSIS — I130 Hypertensive heart and chronic kidney disease with heart failure and stage 1 through stage 4 chronic kidney disease, or unspecified chronic kidney disease: Secondary | ICD-10-CM | POA: Diagnosis not present

## 2015-11-09 DIAGNOSIS — I82401 Acute embolism and thrombosis of unspecified deep veins of right lower extremity: Secondary | ICD-10-CM | POA: Diagnosis not present

## 2015-11-09 DIAGNOSIS — I5032 Chronic diastolic (congestive) heart failure: Secondary | ICD-10-CM | POA: Diagnosis not present

## 2015-11-13 DIAGNOSIS — L89154 Pressure ulcer of sacral region, stage 4: Secondary | ICD-10-CM | POA: Diagnosis not present

## 2015-11-13 DIAGNOSIS — N183 Chronic kidney disease, stage 3 (moderate): Secondary | ICD-10-CM | POA: Diagnosis not present

## 2015-11-13 DIAGNOSIS — G8311 Monoplegia of lower limb affecting right dominant side: Secondary | ICD-10-CM | POA: Diagnosis not present

## 2015-11-13 DIAGNOSIS — I130 Hypertensive heart and chronic kidney disease with heart failure and stage 1 through stage 4 chronic kidney disease, or unspecified chronic kidney disease: Secondary | ICD-10-CM | POA: Diagnosis not present

## 2015-11-13 DIAGNOSIS — I5032 Chronic diastolic (congestive) heart failure: Secondary | ICD-10-CM | POA: Diagnosis not present

## 2015-11-13 DIAGNOSIS — D638 Anemia in other chronic diseases classified elsewhere: Secondary | ICD-10-CM | POA: Diagnosis not present

## 2015-11-13 DIAGNOSIS — I82401 Acute embolism and thrombosis of unspecified deep veins of right lower extremity: Secondary | ICD-10-CM | POA: Diagnosis not present

## 2015-11-13 DIAGNOSIS — C7951 Secondary malignant neoplasm of bone: Secondary | ICD-10-CM | POA: Diagnosis not present

## 2015-11-13 DIAGNOSIS — I2510 Atherosclerotic heart disease of native coronary artery without angina pectoris: Secondary | ICD-10-CM | POA: Diagnosis not present

## 2015-11-13 DIAGNOSIS — E440 Moderate protein-calorie malnutrition: Secondary | ICD-10-CM | POA: Diagnosis not present

## 2015-11-13 DIAGNOSIS — I4891 Unspecified atrial fibrillation: Secondary | ICD-10-CM | POA: Diagnosis not present

## 2015-11-13 DIAGNOSIS — C3490 Malignant neoplasm of unspecified part of unspecified bronchus or lung: Secondary | ICD-10-CM | POA: Diagnosis not present

## 2015-11-14 ENCOUNTER — Telehealth: Payer: Self-pay | Admitting: Internal Medicine

## 2015-11-14 NOTE — Telephone Encounter (Signed)
returned call and s.w. pt dtr and confirmed appt.Marland KitchenMarland KitchenMarland KitchenMarland Kitchenpt ok and aware

## 2015-11-15 DIAGNOSIS — D638 Anemia in other chronic diseases classified elsewhere: Secondary | ICD-10-CM | POA: Diagnosis not present

## 2015-11-15 DIAGNOSIS — I5032 Chronic diastolic (congestive) heart failure: Secondary | ICD-10-CM | POA: Diagnosis not present

## 2015-11-15 DIAGNOSIS — L89154 Pressure ulcer of sacral region, stage 4: Secondary | ICD-10-CM | POA: Diagnosis not present

## 2015-11-15 DIAGNOSIS — N183 Chronic kidney disease, stage 3 (moderate): Secondary | ICD-10-CM | POA: Diagnosis not present

## 2015-11-15 DIAGNOSIS — C3490 Malignant neoplasm of unspecified part of unspecified bronchus or lung: Secondary | ICD-10-CM | POA: Diagnosis not present

## 2015-11-15 DIAGNOSIS — C7951 Secondary malignant neoplasm of bone: Secondary | ICD-10-CM | POA: Diagnosis not present

## 2015-11-15 DIAGNOSIS — G8311 Monoplegia of lower limb affecting right dominant side: Secondary | ICD-10-CM | POA: Diagnosis not present

## 2015-11-15 DIAGNOSIS — E440 Moderate protein-calorie malnutrition: Secondary | ICD-10-CM | POA: Diagnosis not present

## 2015-11-15 DIAGNOSIS — I2510 Atherosclerotic heart disease of native coronary artery without angina pectoris: Secondary | ICD-10-CM | POA: Diagnosis not present

## 2015-11-15 DIAGNOSIS — I82401 Acute embolism and thrombosis of unspecified deep veins of right lower extremity: Secondary | ICD-10-CM | POA: Diagnosis not present

## 2015-11-15 DIAGNOSIS — I130 Hypertensive heart and chronic kidney disease with heart failure and stage 1 through stage 4 chronic kidney disease, or unspecified chronic kidney disease: Secondary | ICD-10-CM | POA: Diagnosis not present

## 2015-11-15 DIAGNOSIS — I4891 Unspecified atrial fibrillation: Secondary | ICD-10-CM | POA: Diagnosis not present

## 2015-11-17 DIAGNOSIS — N183 Chronic kidney disease, stage 3 (moderate): Secondary | ICD-10-CM | POA: Diagnosis not present

## 2015-11-17 DIAGNOSIS — I130 Hypertensive heart and chronic kidney disease with heart failure and stage 1 through stage 4 chronic kidney disease, or unspecified chronic kidney disease: Secondary | ICD-10-CM | POA: Diagnosis not present

## 2015-11-17 DIAGNOSIS — I2510 Atherosclerotic heart disease of native coronary artery without angina pectoris: Secondary | ICD-10-CM | POA: Diagnosis not present

## 2015-11-17 DIAGNOSIS — D638 Anemia in other chronic diseases classified elsewhere: Secondary | ICD-10-CM | POA: Diagnosis not present

## 2015-11-17 DIAGNOSIS — G8311 Monoplegia of lower limb affecting right dominant side: Secondary | ICD-10-CM | POA: Diagnosis not present

## 2015-11-17 DIAGNOSIS — I5032 Chronic diastolic (congestive) heart failure: Secondary | ICD-10-CM | POA: Diagnosis not present

## 2015-11-17 DIAGNOSIS — C3490 Malignant neoplasm of unspecified part of unspecified bronchus or lung: Secondary | ICD-10-CM | POA: Diagnosis not present

## 2015-11-17 DIAGNOSIS — E440 Moderate protein-calorie malnutrition: Secondary | ICD-10-CM | POA: Diagnosis not present

## 2015-11-17 DIAGNOSIS — I4891 Unspecified atrial fibrillation: Secondary | ICD-10-CM | POA: Diagnosis not present

## 2015-11-17 DIAGNOSIS — I82401 Acute embolism and thrombosis of unspecified deep veins of right lower extremity: Secondary | ICD-10-CM | POA: Diagnosis not present

## 2015-11-17 DIAGNOSIS — C7951 Secondary malignant neoplasm of bone: Secondary | ICD-10-CM | POA: Diagnosis not present

## 2015-11-17 DIAGNOSIS — L89154 Pressure ulcer of sacral region, stage 4: Secondary | ICD-10-CM | POA: Diagnosis not present

## 2015-11-20 ENCOUNTER — Ambulatory Visit: Payer: Medicare Other | Admitting: Cardiovascular Disease

## 2015-11-20 DIAGNOSIS — I5032 Chronic diastolic (congestive) heart failure: Secondary | ICD-10-CM | POA: Diagnosis not present

## 2015-11-20 DIAGNOSIS — G8311 Monoplegia of lower limb affecting right dominant side: Secondary | ICD-10-CM | POA: Diagnosis not present

## 2015-11-20 DIAGNOSIS — I82401 Acute embolism and thrombosis of unspecified deep veins of right lower extremity: Secondary | ICD-10-CM | POA: Diagnosis not present

## 2015-11-20 DIAGNOSIS — C7951 Secondary malignant neoplasm of bone: Secondary | ICD-10-CM | POA: Diagnosis not present

## 2015-11-20 DIAGNOSIS — N183 Chronic kidney disease, stage 3 (moderate): Secondary | ICD-10-CM | POA: Diagnosis not present

## 2015-11-20 DIAGNOSIS — I2510 Atherosclerotic heart disease of native coronary artery without angina pectoris: Secondary | ICD-10-CM | POA: Diagnosis not present

## 2015-11-20 DIAGNOSIS — C3490 Malignant neoplasm of unspecified part of unspecified bronchus or lung: Secondary | ICD-10-CM | POA: Diagnosis not present

## 2015-11-20 DIAGNOSIS — E440 Moderate protein-calorie malnutrition: Secondary | ICD-10-CM | POA: Diagnosis not present

## 2015-11-20 DIAGNOSIS — L89154 Pressure ulcer of sacral region, stage 4: Secondary | ICD-10-CM | POA: Diagnosis not present

## 2015-11-20 DIAGNOSIS — D638 Anemia in other chronic diseases classified elsewhere: Secondary | ICD-10-CM | POA: Diagnosis not present

## 2015-11-20 DIAGNOSIS — I130 Hypertensive heart and chronic kidney disease with heart failure and stage 1 through stage 4 chronic kidney disease, or unspecified chronic kidney disease: Secondary | ICD-10-CM | POA: Diagnosis not present

## 2015-11-20 DIAGNOSIS — I4891 Unspecified atrial fibrillation: Secondary | ICD-10-CM | POA: Diagnosis not present

## 2015-11-21 ENCOUNTER — Ambulatory Visit (INDEPENDENT_AMBULATORY_CARE_PROVIDER_SITE_OTHER): Payer: Medicare Other | Admitting: Urology

## 2015-11-21 DIAGNOSIS — L89154 Pressure ulcer of sacral region, stage 4: Secondary | ICD-10-CM | POA: Diagnosis not present

## 2015-11-21 DIAGNOSIS — N131 Hydronephrosis with ureteral stricture, not elsewhere classified: Secondary | ICD-10-CM | POA: Diagnosis not present

## 2015-11-21 DIAGNOSIS — R222 Localized swelling, mass and lump, trunk: Secondary | ICD-10-CM | POA: Diagnosis not present

## 2015-11-22 DIAGNOSIS — C7951 Secondary malignant neoplasm of bone: Secondary | ICD-10-CM | POA: Diagnosis not present

## 2015-11-22 DIAGNOSIS — I130 Hypertensive heart and chronic kidney disease with heart failure and stage 1 through stage 4 chronic kidney disease, or unspecified chronic kidney disease: Secondary | ICD-10-CM | POA: Diagnosis not present

## 2015-11-22 DIAGNOSIS — D638 Anemia in other chronic diseases classified elsewhere: Secondary | ICD-10-CM | POA: Diagnosis not present

## 2015-11-22 DIAGNOSIS — I5032 Chronic diastolic (congestive) heart failure: Secondary | ICD-10-CM | POA: Diagnosis not present

## 2015-11-22 DIAGNOSIS — I82401 Acute embolism and thrombosis of unspecified deep veins of right lower extremity: Secondary | ICD-10-CM | POA: Diagnosis not present

## 2015-11-22 DIAGNOSIS — G8311 Monoplegia of lower limb affecting right dominant side: Secondary | ICD-10-CM | POA: Diagnosis not present

## 2015-11-22 DIAGNOSIS — L89154 Pressure ulcer of sacral region, stage 4: Secondary | ICD-10-CM | POA: Diagnosis not present

## 2015-11-22 DIAGNOSIS — I4891 Unspecified atrial fibrillation: Secondary | ICD-10-CM | POA: Diagnosis not present

## 2015-11-22 DIAGNOSIS — N183 Chronic kidney disease, stage 3 (moderate): Secondary | ICD-10-CM | POA: Diagnosis not present

## 2015-11-22 DIAGNOSIS — E440 Moderate protein-calorie malnutrition: Secondary | ICD-10-CM | POA: Diagnosis not present

## 2015-11-22 DIAGNOSIS — C3490 Malignant neoplasm of unspecified part of unspecified bronchus or lung: Secondary | ICD-10-CM | POA: Diagnosis not present

## 2015-11-22 DIAGNOSIS — I2510 Atherosclerotic heart disease of native coronary artery without angina pectoris: Secondary | ICD-10-CM | POA: Diagnosis not present

## 2015-11-23 DIAGNOSIS — I5033 Acute on chronic diastolic (congestive) heart failure: Secondary | ICD-10-CM | POA: Diagnosis not present

## 2015-11-23 DIAGNOSIS — M6281 Muscle weakness (generalized): Secondary | ICD-10-CM | POA: Diagnosis not present

## 2015-11-24 DIAGNOSIS — I5032 Chronic diastolic (congestive) heart failure: Secondary | ICD-10-CM | POA: Diagnosis not present

## 2015-11-24 DIAGNOSIS — D638 Anemia in other chronic diseases classified elsewhere: Secondary | ICD-10-CM | POA: Diagnosis not present

## 2015-11-24 DIAGNOSIS — I82401 Acute embolism and thrombosis of unspecified deep veins of right lower extremity: Secondary | ICD-10-CM | POA: Diagnosis not present

## 2015-11-24 DIAGNOSIS — E440 Moderate protein-calorie malnutrition: Secondary | ICD-10-CM | POA: Diagnosis not present

## 2015-11-24 DIAGNOSIS — N183 Chronic kidney disease, stage 3 (moderate): Secondary | ICD-10-CM | POA: Diagnosis not present

## 2015-11-24 DIAGNOSIS — L89154 Pressure ulcer of sacral region, stage 4: Secondary | ICD-10-CM | POA: Diagnosis not present

## 2015-11-24 DIAGNOSIS — I2510 Atherosclerotic heart disease of native coronary artery without angina pectoris: Secondary | ICD-10-CM | POA: Diagnosis not present

## 2015-11-24 DIAGNOSIS — I4891 Unspecified atrial fibrillation: Secondary | ICD-10-CM | POA: Diagnosis not present

## 2015-11-24 DIAGNOSIS — I130 Hypertensive heart and chronic kidney disease with heart failure and stage 1 through stage 4 chronic kidney disease, or unspecified chronic kidney disease: Secondary | ICD-10-CM | POA: Diagnosis not present

## 2015-11-24 DIAGNOSIS — G8311 Monoplegia of lower limb affecting right dominant side: Secondary | ICD-10-CM | POA: Diagnosis not present

## 2015-11-24 DIAGNOSIS — C3490 Malignant neoplasm of unspecified part of unspecified bronchus or lung: Secondary | ICD-10-CM | POA: Diagnosis not present

## 2015-11-24 DIAGNOSIS — C7951 Secondary malignant neoplasm of bone: Secondary | ICD-10-CM | POA: Diagnosis not present

## 2015-11-28 ENCOUNTER — Encounter (HOSPITAL_COMMUNITY): Payer: Self-pay | Admitting: Emergency Medicine

## 2015-11-28 ENCOUNTER — Inpatient Hospital Stay (HOSPITAL_COMMUNITY)
Admission: EM | Admit: 2015-11-28 | Discharge: 2015-11-30 | DRG: 699 | Disposition: A | Discharge status: Home Health | Payer: Medicare Other | Attending: Internal Medicine | Admitting: Internal Medicine

## 2015-11-28 ENCOUNTER — Other Ambulatory Visit: Payer: Self-pay

## 2015-11-28 ENCOUNTER — Emergency Department (HOSPITAL_COMMUNITY): Payer: Medicare Other

## 2015-11-28 DIAGNOSIS — I5032 Chronic diastolic (congestive) heart failure: Secondary | ICD-10-CM | POA: Diagnosis not present

## 2015-11-28 DIAGNOSIS — E876 Hypokalemia: Secondary | ICD-10-CM | POA: Diagnosis not present

## 2015-11-28 DIAGNOSIS — C7951 Secondary malignant neoplasm of bone: Secondary | ICD-10-CM | POA: Diagnosis not present

## 2015-11-28 DIAGNOSIS — I4891 Unspecified atrial fibrillation: Secondary | ICD-10-CM | POA: Diagnosis not present

## 2015-11-28 DIAGNOSIS — R112 Nausea with vomiting, unspecified: Secondary | ICD-10-CM | POA: Diagnosis not present

## 2015-11-28 DIAGNOSIS — C3490 Malignant neoplasm of unspecified part of unspecified bronchus or lung: Secondary | ICD-10-CM | POA: Diagnosis present

## 2015-11-28 DIAGNOSIS — Z86718 Personal history of other venous thrombosis and embolism: Secondary | ICD-10-CM

## 2015-11-28 DIAGNOSIS — I2510 Atherosclerotic heart disease of native coronary artery without angina pectoris: Secondary | ICD-10-CM | POA: Diagnosis present

## 2015-11-28 DIAGNOSIS — R197 Diarrhea, unspecified: Secondary | ICD-10-CM | POA: Diagnosis not present

## 2015-11-28 DIAGNOSIS — N183 Chronic kidney disease, stage 3 (moderate): Secondary | ICD-10-CM | POA: Diagnosis not present

## 2015-11-28 DIAGNOSIS — Z880 Allergy status to penicillin: Secondary | ICD-10-CM | POA: Diagnosis not present

## 2015-11-28 DIAGNOSIS — I255 Ischemic cardiomyopathy: Secondary | ICD-10-CM | POA: Diagnosis present

## 2015-11-28 DIAGNOSIS — E861 Hypovolemia: Secondary | ICD-10-CM | POA: Diagnosis not present

## 2015-11-28 DIAGNOSIS — R1110 Vomiting, unspecified: Secondary | ICD-10-CM | POA: Diagnosis not present

## 2015-11-28 DIAGNOSIS — D649 Anemia, unspecified: Secondary | ICD-10-CM | POA: Diagnosis present

## 2015-11-28 DIAGNOSIS — R109 Unspecified abdominal pain: Secondary | ICD-10-CM | POA: Insufficient documentation

## 2015-11-28 DIAGNOSIS — N1330 Unspecified hydronephrosis: Secondary | ICD-10-CM

## 2015-11-28 DIAGNOSIS — Z85118 Personal history of other malignant neoplasm of bronchus and lung: Secondary | ICD-10-CM

## 2015-11-28 DIAGNOSIS — Y846 Urinary catheterization as the cause of abnormal reaction of the patient, or of later complication, without mention of misadventure at the time of the procedure: Secondary | ICD-10-CM | POA: Diagnosis present

## 2015-11-28 DIAGNOSIS — Z888 Allergy status to other drugs, medicaments and biological substances status: Secondary | ICD-10-CM

## 2015-11-28 DIAGNOSIS — E785 Hyperlipidemia, unspecified: Secondary | ICD-10-CM | POA: Diagnosis present

## 2015-11-28 DIAGNOSIS — T83511A Infection and inflammatory reaction due to indwelling urethral catheter, initial encounter: Secondary | ICD-10-CM | POA: Diagnosis not present

## 2015-11-28 DIAGNOSIS — K219 Gastro-esophageal reflux disease without esophagitis: Secondary | ICD-10-CM | POA: Diagnosis not present

## 2015-11-28 DIAGNOSIS — Z883 Allergy status to other anti-infective agents status: Secondary | ICD-10-CM

## 2015-11-28 DIAGNOSIS — Z87891 Personal history of nicotine dependence: Secondary | ICD-10-CM | POA: Diagnosis not present

## 2015-11-28 DIAGNOSIS — Z955 Presence of coronary angioplasty implant and graft: Secondary | ICD-10-CM

## 2015-11-28 DIAGNOSIS — N189 Chronic kidney disease, unspecified: Secondary | ICD-10-CM | POA: Diagnosis not present

## 2015-11-28 DIAGNOSIS — E860 Dehydration: Secondary | ICD-10-CM | POA: Diagnosis present

## 2015-11-28 DIAGNOSIS — Z923 Personal history of irradiation: Secondary | ICD-10-CM

## 2015-11-28 DIAGNOSIS — N390 Urinary tract infection, site not specified: Secondary | ICD-10-CM | POA: Diagnosis present

## 2015-11-28 DIAGNOSIS — N179 Acute kidney failure, unspecified: Secondary | ICD-10-CM | POA: Diagnosis present

## 2015-11-28 DIAGNOSIS — I130 Hypertensive heart and chronic kidney disease with heart failure and stage 1 through stage 4 chronic kidney disease, or unspecified chronic kidney disease: Secondary | ICD-10-CM | POA: Diagnosis not present

## 2015-11-28 DIAGNOSIS — I129 Hypertensive chronic kidney disease with stage 1 through stage 4 chronic kidney disease, or unspecified chronic kidney disease: Secondary | ICD-10-CM | POA: Diagnosis not present

## 2015-11-28 DIAGNOSIS — R279 Unspecified lack of coordination: Secondary | ICD-10-CM | POA: Diagnosis not present

## 2015-11-28 DIAGNOSIS — K2970 Gastritis, unspecified, without bleeding: Secondary | ICD-10-CM | POA: Diagnosis not present

## 2015-11-28 DIAGNOSIS — Z7401 Bed confinement status: Secondary | ICD-10-CM | POA: Diagnosis not present

## 2015-11-28 HISTORY — DX: Acute embolism and thrombosis of unspecified deep veins of unspecified lower extremity: I82.409

## 2015-11-28 LAB — URINE MICROSCOPIC-ADD ON

## 2015-11-28 LAB — BASIC METABOLIC PANEL
ANION GAP: 10 (ref 5–15)
Anion gap: 11 (ref 5–15)
BUN: 65 mg/dL — ABNORMAL HIGH (ref 6–20)
BUN: 63 mg/dL — AB (ref 6–20)
CALCIUM: 7.2 mg/dL — AB (ref 8.9–10.3)
CHLORIDE: 107 mmol/L (ref 101–111)
CO2: 18 mmol/L — ABNORMAL LOW (ref 22–32)
CO2: 19 mmol/L — AB (ref 22–32)
CREATININE: 3.82 mg/dL — AB (ref 0.44–1.00)
CREATININE: 3.81 mg/dL — AB (ref 0.44–1.00)
Calcium: 7.9 mg/dL — ABNORMAL LOW (ref 8.9–10.3)
Chloride: 105 mmol/L (ref 101–111)
GFR calc non Af Amer: 10 mL/min — ABNORMAL LOW (ref >60)
GFR calc non Af Amer: 10 mL/min — ABNORMAL LOW (ref >60)
GFR, EST AFRICAN AMERICAN: 12 mL/min — AB (ref >60)
GFR, EST AFRICAN AMERICAN: 12 mL/min — AB (ref >60)
Glucose, Bld: 135 mg/dL — ABNORMAL HIGH (ref 65–99)
Glucose, Bld: 135 mg/dL — ABNORMAL HIGH (ref 65–99)
POTASSIUM: 3.4 mmol/L — AB (ref 3.5–5.1)
Potassium: 2.9 mmol/L — ABNORMAL LOW (ref 3.5–5.1)
SODIUM: 133 mmol/L — AB (ref 135–145)
SODIUM: 137 mmol/L (ref 135–145)

## 2015-11-28 LAB — CBC
HEMATOCRIT: 28.8 % — AB (ref 36.0–46.0)
Hemoglobin: 9.9 g/dL — ABNORMAL LOW (ref 12.0–15.0)
MCH: 28.3 pg (ref 26.0–34.0)
MCHC: 34.4 g/dL (ref 30.0–36.0)
MCV: 82.3 fL (ref 78.0–100.0)
PLATELETS: 200 10*3/uL (ref 150–400)
RBC: 3.5 MIL/uL — AB (ref 3.87–5.11)
RDW: 14.3 % (ref 11.5–15.5)
WBC: 7.6 10*3/uL (ref 4.0–10.5)

## 2015-11-28 LAB — CBC WITH DIFFERENTIAL/PLATELET
Basophils Absolute: 0 K/uL (ref 0.0–0.1)
Basophils Relative: 0 %
Eosinophils Absolute: 0.1 K/uL (ref 0.0–0.7)
Eosinophils Relative: 1 %
HCT: 33.1 % — ABNORMAL LOW (ref 36.0–46.0)
Hemoglobin: 11.3 g/dL — ABNORMAL LOW (ref 12.0–15.0)
Lymphocytes Relative: 9 %
Lymphs Abs: 0.7 K/uL (ref 0.7–4.0)
MCH: 27.9 pg (ref 26.0–34.0)
MCHC: 34.1 g/dL (ref 30.0–36.0)
MCV: 81.7 fL (ref 78.0–100.0)
Monocytes Absolute: 0.7 K/uL (ref 0.1–1.0)
Monocytes Relative: 10 %
Neutro Abs: 5.7 K/uL (ref 1.7–7.7)
Neutrophils Relative %: 80 %
Platelets: 210 K/uL (ref 150–400)
RBC: 4.05 MIL/uL (ref 3.87–5.11)
RDW: 14.3 % (ref 11.5–15.5)
WBC: 7.2 K/uL (ref 4.0–10.5)

## 2015-11-28 LAB — COMPREHENSIVE METABOLIC PANEL
ALBUMIN: 2.6 g/dL — AB (ref 3.5–5.0)
ALK PHOS: 102 U/L (ref 38–126)
ALT: 8 U/L — AB (ref 14–54)
AST: 12 U/L — ABNORMAL LOW (ref 15–41)
Anion gap: 13 (ref 5–15)
BILIRUBIN TOTAL: 0.6 mg/dL (ref 0.3–1.2)
BUN: 69 mg/dL — AB (ref 6–20)
CALCIUM: 7.6 mg/dL — AB (ref 8.9–10.3)
CO2: 19 mmol/L — AB (ref 22–32)
CREATININE: 4.1 mg/dL — AB (ref 0.44–1.00)
Chloride: 103 mmol/L (ref 101–111)
GFR calc Af Amer: 11 mL/min — ABNORMAL LOW (ref >60)
GFR calc non Af Amer: 9 mL/min — ABNORMAL LOW (ref >60)
GLUCOSE: 157 mg/dL — AB (ref 65–99)
Potassium: 2.2 mmol/L — CL (ref 3.5–5.1)
SODIUM: 135 mmol/L (ref 135–145)
Total Protein: 6 g/dL — ABNORMAL LOW (ref 6.5–8.1)

## 2015-11-28 LAB — TSH: TSH: 3.508 u[IU]/mL (ref 0.350–4.500)

## 2015-11-28 LAB — URINALYSIS, ROUTINE W REFLEX MICROSCOPIC
Bilirubin Urine: NEGATIVE
Glucose, UA: NEGATIVE mg/dL
Ketones, ur: NEGATIVE mg/dL
Nitrite: POSITIVE — AB
Specific Gravity, Urine: <1.005 — ABNORMAL LOW (ref 1.005–1.030)
pH: 7 (ref 5.0–8.0)

## 2015-11-28 LAB — MAGNESIUM: Magnesium: 1.5 mg/dL — ABNORMAL LOW (ref 1.7–2.4)

## 2015-11-28 LAB — LIPASE, BLOOD: Lipase: 21 U/L (ref 11–51)

## 2015-11-28 MED ORDER — DEXTROSE 5 % IV SOLN
1.0000 g | INTRAVENOUS | Status: DC
  Administered 2015-11-28 – 2015-11-30 (×3): 1 g via INTRAVENOUS
  Filled 2015-11-28 (×5): qty 10

## 2015-11-28 MED ORDER — GABAPENTIN 100 MG PO CAPS
100.0000 mg | ORAL_CAPSULE | Freq: Every day | ORAL | Status: DC
  Filled 2015-11-28 (×2): qty 1

## 2015-11-28 MED ORDER — MAGNESIUM SULFATE 2 GM/50ML IV SOLN
2.0000 g | Freq: Once | INTRAVENOUS | Status: AC
  Administered 2015-11-28: 2 g via INTRAVENOUS
  Filled 2015-11-28: qty 50

## 2015-11-28 MED ORDER — MORPHINE SULFATE (PF) 4 MG/ML IV SOLN
4.0000 mg | Freq: Once | INTRAVENOUS | Status: DC
  Filled 2015-11-28: qty 1

## 2015-11-28 MED ORDER — ENOXAPARIN SODIUM 60 MG/0.6ML ~~LOC~~ SOLN
1.0000 mg/kg | SUBCUTANEOUS | Status: DC
  Administered 2015-11-28: 60 mg via SUBCUTANEOUS
  Filled 2015-11-28: qty 0.6

## 2015-11-28 MED ORDER — LOPERAMIDE HCL 2 MG PO CAPS
4.0000 mg | ORAL_CAPSULE | Freq: Every day | ORAL | Status: DC
  Administered 2015-11-29 – 2015-11-30 (×2): 4 mg via ORAL
  Filled 2015-11-28 (×2): qty 2

## 2015-11-28 MED ORDER — ENSURE ENLIVE PO LIQD
237.0000 mL | Freq: Two times a day (BID) | ORAL | Status: DC
  Administered 2015-11-29 – 2015-11-30 (×2): 237 mL via ORAL

## 2015-11-28 MED ORDER — MORPHINE SULFATE (PF) 2 MG/ML IV SOLN
2.0000 mg | INTRAVENOUS | Status: DC | PRN
  Administered 2015-11-28: 2 mg via INTRAVENOUS
  Filled 2015-11-28: qty 1

## 2015-11-28 MED ORDER — MAGNESIUM OXIDE 400 (241.3 MG) MG PO TABS
400.0000 mg | ORAL_TABLET | Freq: Every day | ORAL | Status: DC
  Administered 2015-11-28 – 2015-11-30 (×3): 400 mg via ORAL
  Filled 2015-11-28 (×3): qty 1

## 2015-11-28 MED ORDER — ACETAMINOPHEN 325 MG PO TABS
650.0000 mg | ORAL_TABLET | ORAL | Status: DC | PRN
  Administered 2015-11-29: 650 mg via ORAL
  Filled 2015-11-28: qty 2

## 2015-11-28 MED ORDER — POTASSIUM CHLORIDE 10 MEQ/100ML IV SOLN
10.0000 meq | Freq: Once | INTRAVENOUS | Status: AC
  Administered 2015-11-28: 10 meq via INTRAVENOUS
  Filled 2015-11-28: qty 100

## 2015-11-28 MED ORDER — ENOXAPARIN SODIUM 30 MG/0.3ML ~~LOC~~ SOLN
30.0000 mg | SUBCUTANEOUS | Status: DC
  Administered 2015-11-29 – 2015-11-30 (×2): 30 mg via SUBCUTANEOUS
  Filled 2015-11-28 (×2): qty 0.3

## 2015-11-28 MED ORDER — RISAQUAD PO CAPS
2.0000 | ORAL_CAPSULE | Freq: Every day | ORAL | Status: DC
  Administered 2015-11-29 – 2015-11-30 (×2): 2 via ORAL
  Filled 2015-11-28 (×2): qty 2

## 2015-11-28 MED ORDER — POTASSIUM CHLORIDE CRYS ER 20 MEQ PO TBCR
40.0000 meq | EXTENDED_RELEASE_TABLET | Freq: Every day | ORAL | Status: DC
  Administered 2015-11-28 – 2015-11-30 (×3): 40 meq via ORAL
  Filled 2015-11-28 (×4): qty 2

## 2015-11-28 MED ORDER — POTASSIUM CHLORIDE 10 MEQ/100ML IV SOLN
10.0000 meq | INTRAVENOUS | Status: AC
  Administered 2015-11-28: 10 meq via INTRAVENOUS
  Filled 2015-11-28: qty 100

## 2015-11-28 MED ORDER — DIPHENHYDRAMINE HCL 25 MG PO CAPS
25.0000 mg | ORAL_CAPSULE | Freq: Four times a day (QID) | ORAL | Status: DC | PRN
  Administered 2015-11-28: 25 mg via ORAL
  Filled 2015-11-28: qty 1

## 2015-11-28 MED ORDER — ONDANSETRON HCL 4 MG/2ML IJ SOLN
4.0000 mg | Freq: Once | INTRAMUSCULAR | Status: AC
  Administered 2015-11-28: 4 mg via INTRAVENOUS
  Filled 2015-11-28: qty 2

## 2015-11-28 MED ORDER — KCL IN DEXTROSE-NACL 40-5-0.9 MEQ/L-%-% IV SOLN
INTRAVENOUS | Status: AC
  Filled 2015-11-28: qty 1000

## 2015-11-28 MED ORDER — LOPERAMIDE HCL 2 MG PO CAPS: 2.0000 mg | ORAL_CAPSULE | Freq: Four times a day (QID) | ORAL | Status: DC | PRN

## 2015-11-28 MED ORDER — COLLAGENASE 250 UNIT/GM EX OINT
1.0000 "application " | TOPICAL_OINTMENT | Freq: Every day | CUTANEOUS | Status: DC
  Administered 2015-11-28 – 2015-11-30 (×3): 1 via TOPICAL
  Filled 2015-11-28: qty 30

## 2015-11-28 MED ORDER — SODIUM CHLORIDE 0.9% FLUSH
3.0000 mL | Freq: Two times a day (BID) | INTRAVENOUS | Status: DC
  Administered 2015-11-29 – 2015-11-30 (×2): 3 mL via INTRAVENOUS

## 2015-11-28 MED ORDER — SODIUM CHLORIDE 0.9 % IV SOLN
1000.0000 mL | INTRAVENOUS | Status: DC
  Administered 2015-11-28: 1000 mL via INTRAVENOUS

## 2015-11-28 MED ORDER — PANTOPRAZOLE SODIUM 40 MG PO TBEC: 40.0000 mg | DELAYED_RELEASE_TABLET | Freq: Every day | ORAL | Status: DC

## 2015-11-28 MED ORDER — KCL IN DEXTROSE-NACL 40-5-0.9 MEQ/L-%-% IV SOLN
INTRAVENOUS | Status: DC
  Administered 2015-11-28: 04:00:00 via INTRAVENOUS
  Filled 2015-11-28 (×5): qty 1000

## 2015-11-28 MED ORDER — DIATRIZOATE MEGLUMINE & SODIUM 66-10 % PO SOLN
ORAL | Status: AC
  Filled 2015-11-28: qty 60

## 2015-11-28 MED ORDER — SODIUM CHLORIDE 0.9 % IV SOLN
1000.0000 mL | Freq: Once | INTRAVENOUS | Status: AC
  Administered 2015-11-28: 1000 mL via INTRAVENOUS

## 2015-11-28 MED ORDER — SODIUM BICARBONATE 8.4 % IV SOLN
INTRAVENOUS | Status: DC
  Administered 2015-11-28 – 2015-11-29 (×2): via INTRAVENOUS
  Filled 2015-11-28 (×6): qty 1000

## 2015-11-28 MED ORDER — PANTOPRAZOLE SODIUM 40 MG PO TBEC
40.0000 mg | DELAYED_RELEASE_TABLET | Freq: Every day | ORAL | Status: DC
  Administered 2015-11-29 – 2015-11-30 (×2): 40 mg via ORAL
  Filled 2015-11-28 (×2): qty 1

## 2015-11-28 MED ORDER — MORPHINE SULFATE (PF) 2 MG/ML IV SOLN
2.0000 mg | Freq: Once | INTRAVENOUS | Status: AC
  Administered 2015-11-28: 2 mg via INTRAVENOUS
  Filled 2015-11-28: qty 1

## 2015-11-28 NOTE — Consult Note (Signed)
   [facility] CM Inpatient Consult   11/28/2015  [name] [birth year] [id number]  Spoke with patient at bedside regarding [facility] program services. Patient states she will take a brochure, she will discuss with her daughter. Her daughter went home to sleep as she stayed up with patient at hospital last night and will be back later this evening.  RNCM left [facility] brochure and RNCM contact information with patient. Of note, [facility] program services do not replace or interfere with any services arranged by inpatient case management or social work.  Royetta Crochet. Laymond Purser, RN, BSN, [place] [phone]) Business Cell  [phone]) Toll Free Office

## 2015-11-28 NOTE — Care Management Note (Signed)
Case Management Note  Patient Details  Name: [name] MRN: [id number] Date of Birth: 22-Nov-1933  Subjective/Objective: Pt admitted from home with diarrhea, possible radiation enterocolitis per [facility] & P. Patient is active with [place]. Pending oncology consult.         Action/Plan: Anticipate home with resumption of home health at minimal. Will follow.    Expected Discharge Date:                 11/28/2015 Expected Discharge Plan:  [place]  In-House Referral:  NA  Discharge planning Services  CM Consult  Post Acute Care Choice:  Home Health Choice offered to:  Patient  DME Arranged:    DME Agency:     HH Arranged:    [place]:  [facility] (now [facility])  Status of Service:  In process, will continue to follow  If discussed at Long Length of Stay Meetings, dates discussed:    Additional Comments:  Benz Vandenberghe, Chauncey Reading, RN 11/28/2015, 11:05 AM

## 2015-11-28 NOTE — H&P (Signed)
[facility] History and Physical  [name] ZSW:[id number] DOB: Oct 24, 1933    PCP:   Wende Neighbors, MD   Chief Complaint: weakness, diarrhea and nausea.   HPI: [name] is an 82 y.o. female with hx of [place] lung CA, chronic intermittent diarrhea for the past year, noted to be started after radiation to her spine for metastatic disease, CKD, HLD, HTN, ischemic CMP, presented to the ER with intermittent diarrhea and weakness.  She also had some nausea, no vomiting, and abdominal cramps.  She did not have ill contact, recent antibiotics, distant travel, or new medication.  Evaluation in the ER showed UA with evidence of UTI, K of 2.2, Cr of 4.6, with normal WBC and Hb of 11 g per dL.  Her Mag was 1.5.  She was given IV KCL and hospitalist was asked to admit her for hypokalemia from diarrhea, suspicious for radiation enterocolitis, hypomagnesemia, UTI, and AKI on CKD due to volume depletion.    Rewiew of Systems:  Constitutional: Negative for malaise, fever and chills. No significant weight loss or weight gain Eyes: Negative for eye pain, redness and discharge, diplopia, visual changes, or flashes of light. ENMT: Negative for ear pain, hoarseness, nasal congestion, sinus pressure and sore throat. No headaches; tinnitus, drooling, or problem swallowing. Cardiovascular: Negative for chest pain, palpitations, diaphoresis, dyspnea and peripheral edema. ; No orthopnea, PND Respiratory: Negative for cough, hemoptysis, wheezing and stridor. No pleuritic chestpain. Gastrointestinal: Negative for nausea, vomiting, diarrhea, constipation, abdominal pain, melena, blood in stool, hematemesis, jaundice and rectal bleeding.    Genitourinary: Negative for frequency, dysuria, incontinence,flank pain and hematuria; Musculoskeletal: Negative for back pain and neck pain. Negative for swelling and trauma.;  Skin: . Negative for pruritus, rash, abrasions, bruising and skin lesion.; ulcerations Neuro:  Negative for headache, lightheadedness and neck stiffness. Negative for weakness, altered level of consciousness , altered mental status, extremity weakness, burning feet, involuntary movement, seizure and syncope.  Psych: negative for anxiety, depression, insomnia, tearfulness, panic attacks, hallucinations, paranoia, suicidal or homicidal ideation    Past Medical History  Diagnosis Date  . Hypertension   . Hypercholesterolemia   . CKD (chronic kidney disease)   . Coronary artery disease     a.  LHC (06/04/05): LHC done in [place] with high grade RCA => s/p BMS to RCA;  b.  Nuclear (09/14/09): Lexiscan; Inf infarct with mild peri-infarct ishemia, EF 52%; Low Risk.  Marland Kitchen GERD (gastroesophageal reflux disease)   . Chronic anemia   . Ischemic cardiomyopathy     a. Echo (07/26/13): Mild LVH, EF 35-40%, diff HK, inf AK, Gr 2 DD, Tr AI, mildly dilated Ao root, MAC, mild MR, mild LAE, mod reduced RVSF.  . Non-small cell carcinoma of lung ([place])     Stage IV  . Chronic fatigue 04/04/2015  . Chronic fatigue 04/04/2015  . A-fib ([place])   . DVT (deep vein thrombosis) in pregnancy   . Chronic diastolic (congestive) heart failure [facility])     Past Surgical History  Procedure Laterality Date  . Hematoma evacuation  December 2006    groin  . Appendectomy    . Cataract extraction    . Hemorroidectomy    . Laminectomy      Medications:  HOME MEDS: Prior to Admission medications   Medication Sig Start Date End Date Taking? Authorizing Provider  acetaminophen (TYLENOL) 500 MG tablet Take 500 mg by mouth daily as needed for mild pain.   Yes Historical Provider, MD  Amino  Acids-Protein Hydrolys (FEEDING SUPPLEMENT, PRO-STAT SUGAR FREE 64,) LIQD Take 30 mLs by mouth 2 (two) times daily. 07/22/15  Yes Lavina Hamman, MD  diphenoxylate-atropine (LOMOTIL) 2.5-0.025 MG tablet Take by mouth 4 (four) times daily as needed for diarrhea or loose stools.   Yes Historical Provider, MD  feeding supplement,  ENSURE ENLIVE, (ENSURE ENLIVE) LIQD Take 237 mLs by mouth 2 (two) times daily between meals. 07/22/15  Yes Lavina Hamman, MD  furosemide (LASIX) 40 MG tablet Take 1 tablet (40 mg total) by mouth daily. 07/27/15  Yes Geradine Girt, DO  lidocaine-prilocaine (EMLA) cream Apply 1 application topically as needed. 03/17/15  Yes Curt Bears, MD  metoprolol tartrate (LOPRESSOR) 25 MG tablet Take 0.5 tablets (12.5 mg total) by mouth 2 (two) times daily. 07/22/15  Yes Lavina Hamman, MD  pantoprazole (PROTONIX) 40 MG tablet Take 40 mg by mouth at bedtime.   Yes Historical Provider, MD  SANTYL ointment Apply 1 application topically at bedtime as needed. For bed sores 12/27/14  Yes Historical Provider, MD  diltiazem (CARDIZEM) 60 MG tablet Take 1 tablet (60 mg total) by mouth 2 (two) times daily. 07/22/15   Lavina Hamman, MD  diphenhydrAMINE (BENADRYL) 25 mg capsule Take 1 capsule (25 mg total) by mouth every 6 (six) hours as needed. Patient taking differently: Take 25 mg by mouth every 6 (six) hours as needed for allergies.  07/03/15   Robbie Lis, MD  enoxaparin (LOVENOX) 60 MG/0.6ML injection Inject 0.6 mLs (60 mg total) into the skin daily. 07/02/15   Robbie Lis, MD  gabapentin (NEURONTIN) 100 MG capsule Take 1 capsule (100 mg total) by mouth at bedtime. 07/02/15   Robbie Lis, MD  loperamide (IMODIUM) 2 MG capsule Take 1 capsule (2 mg total) by mouth every 6 (six) hours as needed for diarrhea or loose stools. 07/02/15   Robbie Lis, MD  predniSONE (DELTASONE) 10 MG tablet Take 1 tablet (10 mg total) by mouth daily with breakfast. 07/23/15   Lavina Hamman, MD     Allergies:  Allergies  Allergen Reactions  . Amlodipine Swelling  . Ciprofloxacin Hives and Other (See Comments)    REACTION: weakness  . Mirtazapine Other (See Comments)    Hallucinations and nightmares, verbally aggressive   . Statins Other (See Comments)  . Aspirin Swelling    Mouth swelling, tongue Patient reports that she  tolerates ibuprofen without problem  . Cephalexin Hives  . Hydralazine Nausea And Vomiting  . Iron Nausea And Vomiting  . Ambien [Zolpidem Tartrate] Other (See Comments)    confusion  . Lorazepam Other (See Comments)    Hallucinations, verbally aggressive  . Sulfonamide Derivatives Hives  . Zolpidem     Confusion  . Penicillins Other (See Comments)    Has patient had a PCN reaction causing immediate rash, facial/tongue/throat swelling, SOB or lightheadedness with hypotension: Yes Has patient had a PCN reaction causing severe rash involving mucus membranes or skin necrosis: Yes Has patient had a PCN reaction that required hospitalization No Has patient had a PCN reaction occurring within the last 10 years: No If all of the above answers are "NO", then may proceed with Cephalosporin use.   . Shellfish Allergy Hives and Rash  . Tape Rash    Social History:   reports that she has quit smoking. Her smoking use included Cigarettes. She has never used smokeless tobacco. She reports that she does not drink alcohol or use illicit drugs.  Family History: Family History  Problem Relation Age of Onset  . Heart failure Mother   . Lung cancer Sister   . Lung cancer Brother   . Stomach cancer Brother   . Heart attack Neg Hx   . Stroke Neg Hx      Physical Exam: Filed Vitals:   11/28/15 0130 11/28/15 0200 11/28/15 0230 11/28/15 0321  BP: 136/61 143/58 124/55 137/71  Pulse: 94 75 72 75  Temp:      TempSrc:      Resp:    18  Height:      Weight:      SpO2: 99% 100% 100% 100%   Blood pressure 137/71, pulse 75, temperature 98.2 F (36.8 C), temperature source Oral, resp. rate 18, height 5' 8" (1.727 m), weight 59.875 kg (132 lb), SpO2 100 %.  GEN:  Pleasant  patient lying in the stretcher in no acute distress; cooperative with exam. PSYCH:  alert and oriented x4; does not appear anxious or depressed; affect is appropriate. HEENT: Mucous membranes pink and anicteric; PERRLA; EOM  intact; no cervical lymphadenopathy nor thyromegaly or carotid bruit; no JVD; There were no stridor. Neck is very supple. Breasts:: Not examined CHEST WALL: No tenderness.  Port in place.  CHEST: Normal respiration, clear to auscultation bilaterally.  HEART: Regular rate and rhythm.  There are no murmur, rub, or gallops.   BACK: No kyphosis or scoliosis; no CVA tenderness ABDOMEN: soft and non-tender; no masses, no organomegaly, normal abdominal bowel sounds; no pannus; no intertriginous candida. There is no rebound and no distention. Rectal Exam: Not done EXTREMITIES: No bone or joint deformity; age-appropriate arthropathy of the hands and knees; no edema; no ulcerations.  There is no calf tenderness. Genitalia: not examined PULSES: 2+ and symmetric SKIN: Normal hydration no rash or ulceration CNS: Cranial nerves 2-12 grossly intact no focal lateralizing neurologic deficit.  Speech is fluent; uvula elevated with phonation, facial symmetry and tongue midline. DTR are normal bilaterally, cerebella exam is intact, barbinski is negative and strengths are equaled bilaterally.  No sensory loss.   Labs on Admission:  Basic Metabolic Panel:  Recent Labs Lab 11/28/15 0110  NA 135  K 2.2*  CL 103  CO2 19*  GLUCOSE 157*  BUN 69*  CREATININE 4.10*  CALCIUM 7.6*  MG 1.5*   Liver Function Tests:  Recent Labs Lab 11/28/15 0110  AST 12*  ALT 8*  ALKPHOS 102  BILITOT 0.6  PROT 6.0*  ALBUMIN 2.6*    Recent Labs Lab 11/28/15 0110  LIPASE 21   No results for input(s): AMMONIA in the last 168 hours. CBC:  Recent Labs Lab 11/28/15 0110  WBC 7.2  NEUTROABS 5.7  HGB 11.3*  HCT 33.1*  MCV 81.7  PLT 210    Assessment/Plan Present on Admission:  . Diarrhea . Hypokalemia . Lung cancer stage IV with Metastatic squamous cell carcinoma to bone ([place]) . UTI (lower urinary tract infection) . Dehydration . Acute renal failure superimposed on stage 3 chronic kidney disease  (HCC)  PLAN:  Hypokalemia and hypoMag:  Will supplement both IV and orally.  They are likely from diarrhea.   AKI on CKD:  Will give IVF as she is hypovolemic. Diarrhea:  Agreed with sending stool for studies.  She doesn't want to see GI but she is considering it.  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  UTI:  Will continue  with IV Rocephin every day.  Urine culture.  Lung CA stage IV with bone met:  Follow up with oncology.   Other plans as per orders. Code Status: FULL Haskel Khan, MD. FACP [facility] Pager [phone] 7pm to 7am.  11/28/2015, 3:26 AM

## 2015-11-28 NOTE — ED Provider Notes (Signed)
CSN: [id number]     Arrival date & time 11/28/15  0028 History  By signing my name below, I, Higinio Plan, attest that this documentation has been prepared under the direction and in the presence of [name], MD . Electronically Signed: Higinio Plan, Scribe. 11/28/2015. 1:00 AM.   Chief Complaint  Patient presents with  . Emesis   The history is provided by the patient and a relative. No language interpreter was used.  HPI Comments: [name] is a 82 y.o. female with PMHx of HTN, HLD, CAD, and GERD, who presents to the Emergency Department complaining of gradually worsening, 7/10, nausea, vomiting, and diarrhea that began ~2 weeks ago but worsened last night. Pt states her episodes of vomiting began early this morning at ~2AM and began again a few hours PTA. Pt notes she feels temporarily relieved after episodes of vomiting and diarrhea. Pt also complains of periumbilical abdominal pain that began a few days ago. Pt's daughter notes she has given pt imodium since Sunday evening to relieve her pain with mild relief. Per daughter, her diarrhea does not smell worse than usual; however, her daughter states that her urine smells abnormal. Pt denies hematuria, blood in her stool, and hematemesis. Pt also denies fever, chills, and diaphoresis.   Past Medical History  Diagnosis Date  . Hypertension   . Hypercholesterolemia   . CKD (chronic kidney disease)   . Coronary artery disease     a.  LHC (06/04/05): LHC done in [place] with high grade RCA => s/p BMS to RCA;  b.  Nuclear (09/14/09): Lexiscan; Inf infarct with mild peri-infarct ishemia, EF 52%; Low Risk.  Marland Kitchen GERD (gastroesophageal reflux disease)   . Chronic anemia   . Ischemic cardiomyopathy     a. Echo (07/26/13): Mild LVH, EF 35-40%, diff HK, inf AK, Gr 2 DD, Tr AI, mildly dilated Ao root, MAC, mild MR, mild LAE, mod reduced RVSF.  . Non-small cell carcinoma of lung ([place])     Stage IV  . Chronic fatigue 04/04/2015  . Chronic fatigue  04/04/2015  . A-fib ([place])   . DVT (deep vein thrombosis) in pregnancy   . Chronic diastolic (congestive) heart failure [facility])    Past Surgical History  Procedure Laterality Date  . Hematoma evacuation  December 2006    groin  . Appendectomy    . Cataract extraction    . Hemorroidectomy    . Laminectomy     Family History  Problem Relation Age of Onset  . Heart failure Mother   . Lung cancer Sister   . Lung cancer Brother   . Stomach cancer Brother   . Heart attack Neg Hx   . Stroke Neg Hx    Social History  Substance Use Topics  . Smoking status: Former Smoker    Types: Cigarettes  . Smokeless tobacco: Never Used  . Alcohol Use: No   OB History    No data available     Review of Systems  Constitutional: Negative for fever, chills and diaphoresis.  Gastrointestinal: Positive for nausea, vomiting, abdominal pain and diarrhea.  Genitourinary: Negative for hematuria.   Allergies  Amlodipine; Ciprofloxacin; Mirtazapine; Statins; Aspirin; Cephalexin; Hydralazine; Iron; Ambien; Lorazepam; Sulfonamide derivatives; Zolpidem; Penicillins; Shellfish allergy; and Tape  Home Medications   Prior to Admission medications   Medication Sig Start Date End Date Taking? Authorizing Provider  acetaminophen (TYLENOL) 500 MG tablet Take 500 mg by mouth daily as needed for mild pain.   Yes Historical  Provider, MD  Amino Acids-Protein Hydrolys (FEEDING SUPPLEMENT, PRO-STAT SUGAR FREE 64,) LIQD Take 30 mLs by mouth 2 (two) times daily. 07/22/15  Yes Lavina Hamman, MD  diphenoxylate-atropine (LOMOTIL) 2.5-0.025 MG tablet Take by mouth 4 (four) times daily as needed for diarrhea or loose stools.   Yes Historical Provider, MD  feeding supplement, ENSURE ENLIVE, (ENSURE ENLIVE) LIQD Take 237 mLs by mouth 2 (two) times daily between meals. 07/22/15  Yes Lavina Hamman, MD  furosemide (LASIX) 40 MG tablet Take 1 tablet (40 mg total) by mouth daily. 07/27/15  Yes Geradine Girt, DO   lidocaine-prilocaine (EMLA) cream Apply 1 application topically as needed. 03/17/15  Yes Curt Bears, MD  metoprolol tartrate (LOPRESSOR) 25 MG tablet Take 0.5 tablets (12.5 mg total) by mouth 2 (two) times daily. 07/22/15  Yes Lavina Hamman, MD  pantoprazole (PROTONIX) 40 MG tablet Take 40 mg by mouth at bedtime.   Yes Historical Provider, MD  SANTYL ointment Apply 1 application topically at bedtime as needed. For bed sores 12/27/14  Yes Historical Provider, MD  diltiazem (CARDIZEM) 60 MG tablet Take 1 tablet (60 mg total) by mouth 2 (two) times daily. 07/22/15   Lavina Hamman, MD  diphenhydrAMINE (BENADRYL) 25 mg capsule Take 1 capsule (25 mg total) by mouth every 6 (six) hours as needed. Patient taking differently: Take 25 mg by mouth every 6 (six) hours as needed for allergies.  07/03/15   Robbie Lis, MD  enoxaparin (LOVENOX) 60 MG/0.6ML injection Inject 0.6 mLs (60 mg total) into the skin daily. 07/02/15   Robbie Lis, MD  gabapentin (NEURONTIN) 100 MG capsule Take 1 capsule (100 mg total) by mouth at bedtime. 07/02/15   Robbie Lis, MD  loperamide (IMODIUM) 2 MG capsule Take 1 capsule (2 mg total) by mouth every 6 (six) hours as needed for diarrhea or loose stools. 07/02/15   Robbie Lis, MD  predniSONE (DELTASONE) 10 MG tablet Take 1 tablet (10 mg total) by mouth daily with breakfast. 07/23/15   Lavina Hamman, MD   Triage Vitals: BP 111/63 mmHg  Pulse 70  Temp(Src) 98.2 F (36.8 C) (Oral)  Resp 18  Ht '5\' 8"'$  (1.727 m)  Wt 132 lb (59.875 kg)  BMI 20.08 kg/m2  SpO2 98% Physical Exam  Constitutional: She is oriented to person, place, and time. She appears well-developed and well-nourished.  HENT:  Head: Normocephalic and atraumatic.  Eyes: EOM are normal. Pupils are equal, round, and reactive to light. Right eye exhibits no discharge. Left eye exhibits no discharge.  Neck: Normal range of motion. Neck supple. No JVD present.  Cardiovascular: Normal rate, regular rhythm and  normal heart sounds.   No murmur heard. Pulmonary/Chest: Effort normal and breath sounds normal.  Mediport present right subclavian  Abdominal: Soft. She exhibits no distension and no mass. There is tenderness. There is no rebound and no guarding.  Mild periumbilical tenderness  Bowel sounds decreased  Musculoskeletal: Normal range of motion. She exhibits no edema.  Lymphadenopathy:    She has no cervical adenopathy.  Neurological: She is alert and oriented to person, place, and time. No cranial nerve deficit. She exhibits normal muscle tone. Coordination normal.  Skin: Skin is warm and dry. No rash noted.  Psychiatric: She has a normal mood and affect. Her behavior is normal. Judgment and thought content normal.  Nursing note and vitals reviewed.  ED Course  Procedures  DIAGNOSTIC STUDIES:  Oxygen Saturation is 98% on  RA, normal by my interpretation.    COORDINATION OF CARE:  12:54 AM Discussed treatment plan, which includes CT abdomen with pt at bedside and pt agreed to plan.  Labs Review Results for orders placed or performed during the hospital encounter of 11/28/15  Comprehensive metabolic panel  Result Value Ref Range   Sodium 135 135 - 145 mmol/L   Potassium 2.2 (LL) 3.5 - 5.1 mmol/L   Chloride 103 101 - 111 mmol/L   CO2 19 (L) 22 - 32 mmol/L   Glucose, Bld 157 (H) 65 - 99 mg/dL   BUN 69 (H) 6 - 20 mg/dL   Creatinine, Ser 4.10 (H) 0.44 - 1.00 mg/dL   Calcium 7.6 (L) 8.9 - 10.3 mg/dL   Total Protein 6.0 (L) 6.5 - 8.1 g/dL   Albumin 2.6 (L) 3.5 - 5.0 g/dL   AST 12 (L) 15 - 41 U/L   ALT 8 (L) 14 - 54 U/L   Alkaline Phosphatase 102 38 - 126 U/L   Total Bilirubin 0.6 0.3 - 1.2 mg/dL   GFR calc non Af Amer 9 (L) >60 mL/min   GFR calc Af Amer 11 (L) >60 mL/min   Anion gap 13 5 - 15  CBC with Differential  Result Value Ref Range   WBC 7.2 4.0 - 10.5 K/uL   RBC 4.05 3.87 - 5.11 MIL/uL   Hemoglobin 11.3 (L) 12.0 - 15.0 g/dL   HCT 33.1 (L) 36.0 - 46.0 %   MCV 81.7 78.0  - 100.0 fL   MCH 27.9 26.0 - 34.0 pg   MCHC 34.1 30.0 - 36.0 g/dL   RDW 14.3 11.5 - 15.5 %   Platelets 210 150 - 400 K/uL   Neutrophils Relative % 80 %   Neutro Abs 5.7 1.7 - 7.7 K/uL   Lymphocytes Relative 9 %   Lymphs Abs 0.7 0.7 - 4.0 K/uL   Monocytes Relative 10 %   Monocytes Absolute 0.7 0.1 - 1.0 K/uL   Eosinophils Relative 1 %   Eosinophils Absolute 0.1 0.0 - 0.7 K/uL   Basophils Relative 0 %   Basophils Absolute 0.0 0.0 - 0.1 K/uL  Lipase, blood  Result Value Ref Range   Lipase 21 11 - 51 U/L  Urinalysis, Routine w reflex microscopic  Result Value Ref Range   Color, Urine YELLOW YELLOW   APPearance HAZY (A) CLEAR   Specific Gravity, Urine <1.005 (L) 1.005 - 1.030   pH 7.0 5.0 - 8.0   Glucose, UA NEGATIVE NEGATIVE mg/dL   Hgb urine dipstick SMALL (A) NEGATIVE   Bilirubin Urine NEGATIVE NEGATIVE   Ketones, ur NEGATIVE NEGATIVE mg/dL   Protein, ur TRACE (A) NEGATIVE mg/dL   Nitrite POSITIVE (A) NEGATIVE   Leukocytes, UA LARGE (A) NEGATIVE  Urine microscopic-add on  Result Value Ref Range   Squamous Epithelial / LPF 0-5 (A) NONE SEEN   WBC, UA TOO NUMEROUS TO COUNT 0 - 5 WBC/hpf   RBC / HPF 0-5 0 - 5 RBC/hpf   Bacteria, UA MANY (A) NONE SEEN  Magnesium  Result Value Ref Range   Magnesium 1.5 (L) 1.7 - 2.4 mg/dL   I have personally reviewed and evaluated these images and lab results as part of my medical decision-making.   MDM   Final diagnoses:  Acute on chronic renal failure (HCC)  Non-intractable vomiting with nausea, vomiting of unspecified type  Diarrhea, unspecified type  Hypokalemia  Normochromic normocytic anemia  Urinary tract infection associated with indwelling urethral  catheter, initial encounter    Nausea vomiting superimposed on 2 weeks of diarrhea. Patient is clearly at risk for dehydration. IV started with normal saline and screening labs obtained. She's given ondansetron for nausea with good relief of nausea. She has refused morphine for  pain. No antimotility agents are given because she has already been using those at home without benefit. Stool is studies are ordered. Potassium is come back extremely low at 2.2 and she is given intravenous potassium. She also has a significant rise in creatinine. Creatinine at baseline was about 1.8 and had risen to 2.9 and is now over 4. Some of this is dehydration but there may be some intrinsic renal process going on. Case is discussed with Dr. Marin Comment of [facility] agrees to admit the patient. Also, of note, urinalysis does show evidence of infection but she does have an indwelling Foley catheter. She is not clinically infected so antibiotics are not given at this point.  I personally performed the services described in this documentation, which was scribed in my presence. The recorded information has been reviewed and is accurate.      [name], MD 56/97/94 [id number]

## 2015-11-28 NOTE — ED Notes (Signed)
Pt has a wound to her sacral area that her daughter says has been significantly reduced from her previous admissions.  She attributes this to the air mattress pt had when previously admitted and her air mattress at home.

## 2015-11-28 NOTE — Progress Notes (Signed)
Patient admitted to the hospital earlier this morning by Dr. Marin Comment.  Patient seen and examined. She is feeling better at this time. She's not had any further diarrhea since coming to the hospital. She denies any vomiting. On exam, abdomen is benign, lungs are clear and heart sounds are normal.  She's been admitted to the hospital with worsening renal failure, severe hypokalemia and persistent diarrhea that she's had for several weeks now. Etiology of the diarrhea is unclear. Stool studies have been requested. She is receiving IV fluids and potassium replacement with mild improvement of her lab work. Will request GI consult to further evaluate cause of diarrhea in case she needs a colonoscopy. She's recently had radiation for her lung cancer which may be the cause of diarrhea.  Brennin Durfee

## 2015-11-28 NOTE — Consult Note (Signed)
Referring Provider: Dr. Roderic Palau  Primary Care Physician:  Wende Neighbors, MD Primary Gastroenterologist:  Dr. Oneida Alar   Date of Admission: 11/28/15 Date of Consultation: 11/28/15  Reason for Consultation: Diarrhea   HPI:  [name] is an 82 y.o. year old female with a history of non-small cell lung cancer diagnosed in March 2007 with evidence of metastatic disease recurrence in June 2016, undergoing palliative radiotherapy to the soft tissue mass in lumbar area and chemotherapy.   Finished radiation "sometime last year". Acute onset of diarrhea a few weeks ago. 2-3 loose stools per day. No rectal bleeding. Prior to this states one soft BM daily. Remote colonoscopy in the 80s. Abdominal pain located in mid abdomen and constant until presenting to the ED and given Morphine. Abdominal pain improved since admission. N/V prior to admission but now resolved. No dysphagia. Having diarrhea during consultation. Poor appetite for the past 2 weeks. Believes she has lost weight since last year, about 25 lbs. Unable to verify this in epic. No recent antibiotics. No sick contacts.    Past Medical History  Diagnosis Date  . Hypertension   . Hypercholesterolemia   . CKD (chronic kidney disease)   . Coronary artery disease     a.  LHC (06/04/05): LHC done in [place] with high grade RCA => s/p BMS to RCA;  b.  Nuclear (09/14/09): Lexiscan; Inf infarct with mild peri-infarct ishemia, EF 52%; Low Risk.  Marland Kitchen GERD (gastroesophageal reflux disease)   . Chronic anemia   . Ischemic cardiomyopathy     a. Echo (07/26/13): Mild LVH, EF 35-40%, diff HK, inf AK, Gr 2 DD, Tr AI, mildly dilated Ao root, MAC, mild MR, mild LAE, mod reduced RVSF.  . Non-small cell carcinoma of lung ([place])     Stage IV  . Chronic fatigue 04/04/2015  . Chronic fatigue 04/04/2015  . A-fib ([place])   . DVT (deep venous thrombosis) ([place])   . Chronic diastolic (congestive) heart failure [facility])     Past Surgical History  Procedure Laterality  Date  . Hematoma evacuation  December 2006    groin  . Appendectomy    . Cataract extraction    . Hemorroidectomy    . Laminectomy    . Abdominal hysterectomy    . Cholecystectomy  2011  . Ercp  2011    with stone extraction, done same day as cholecystectomy    Prior to Admission medications   Medication Sig Start Date End Date Taking? Authorizing Provider  acetaminophen (TYLENOL) 500 MG tablet Take 500 mg by mouth daily as needed for mild pain.   Yes Historical Provider, MD  diltiazem (CARDIZEM) 60 MG tablet Take 1 tablet (60 mg total) by mouth 2 (two) times daily. 07/22/15  Yes Lavina Hamman, MD  diphenhydrAMINE (BENADRYL) 25 mg capsule Take 1 capsule (25 mg total) by mouth every 6 (six) hours as needed. Patient taking differently: Take 25 mg by mouth every 6 (six) hours as needed for allergies.  07/03/15  Yes Robbie Lis, MD  diphenoxylate-atropine (LOMOTIL) 2.5-0.025 MG tablet Take by mouth 4 (four) times daily as needed for diarrhea or loose stools. Reported on 11/28/2015   Yes Historical Provider, MD  furosemide (LASIX) 40 MG tablet Take 1 tablet (40 mg total) by mouth daily. 07/27/15  Yes Geradine Girt, DO  gabapentin (NEURONTIN) 100 MG capsule Take 1 capsule (100 mg total) by mouth at bedtime. 07/02/15  Yes Robbie Lis, MD  lidocaine-prilocaine (EMLA) cream Apply 1  application topically as needed. Patient taking differently: Apply 1 application topically as needed (pain).  03/17/15  Yes Curt Bears, MD  loperamide (IMODIUM) 2 MG capsule Take 1 capsule (2 mg total) by mouth every 6 (six) hours as needed for diarrhea or loose stools. 07/02/15  Yes Robbie Lis, MD  metoprolol tartrate (LOPRESSOR) 25 MG tablet Take 0.5 tablets (12.5 mg total) by mouth 2 (two) times daily. 07/22/15  Yes Lavina Hamman, MD  pantoprazole (PROTONIX) 40 MG tablet Take 40 mg by mouth at bedtime.   Yes Historical Provider, MD  SANTYL ointment Apply 1 application topically at bedtime as needed (bed  sores). For bed sores 12/27/14  Yes Historical Provider, MD  predniSONE (DELTASONE) 10 MG tablet Take 1 tablet (10 mg total) by mouth daily with breakfast. Patient not taking: Reported on 11/28/2015 07/23/15   Lavina Hamman, MD    Current Facility-Administered Medications  Medication Dose Route Frequency Provider Last Rate Last Dose  . acetaminophen (TYLENOL) tablet 650 mg  650 mg Oral Q4H PRN Orvan Falconer, MD      . cefTRIAXone (ROCEPHIN) 1 g in dextrose 5 % 50 mL IVPB  1 g Intravenous Q24H Orvan Falconer, MD   1 g at 11/28/15 1146  . collagenase (SANTYL) ointment 1 application  1 application Topical Daily Orvan Falconer, MD   1 application at 32/35/57 1148  . dextrose 5 % 1,000 mL with sodium bicarbonate 150 mEq infusion   Intravenous Continuous Kathie Dike, MD 100 mL/hr at 11/28/15 1556    . diphenhydrAMINE (BENADRYL) capsule 25 mg  25 mg Oral Q6H PRN Orvan Falconer, MD   25 mg at 11/28/15 0603  . [START ON 11/29/2015] enoxaparin (LOVENOX) injection 30 mg  30 mg Subcutaneous Q24H Kathie Dike, MD      . feeding supplement (ENSURE ENLIVE) (ENSURE ENLIVE) liquid 237 mL  237 mL Oral BID BM Orvan Falconer, MD   237 mL at 11/28/15 1000  . gabapentin (NEURONTIN) capsule 100 mg  100 mg Oral QHS Orvan Falconer, MD      . loperamide (IMODIUM) capsule 2 mg  2 mg Oral Q6H PRN Orvan Falconer, MD      . magnesium oxide (MAG-OX) tablet 400 mg  400 mg Oral Daily Orvan Falconer, MD   400 mg at 11/28/15 1139  . morphine 2 MG/ML injection 2 mg  2 mg Intravenous Q4H PRN Orvan Falconer, MD   2 mg at 11/28/15 0603  . pantoprazole (PROTONIX) EC tablet 40 mg  40 mg Oral QHS Orvan Falconer, MD      . potassium chloride SA (K-DUR,KLOR-CON) CR tablet 40 mEq  40 mEq Oral Daily Orvan Falconer, MD   40 mEq at 11/28/15 1139  . sodium chloride flush (NS) 0.9 % injection 3 mL  3 mL Intravenous Q12H Orvan Falconer, MD   3 mL at 11/28/15 1000    Allergies as of 11/28/2015 - Review Complete 11/28/2015  Allergen Reaction Noted  . Amlodipine Swelling 11/02/2012  . Ciprofloxacin Hives and  Other (See Comments) 05/02/2009  . Mirtazapine Other (See Comments) 01/03/2015  . Statins Other (See Comments) 05/02/2009  . Aspirin Swelling 05/02/2009  . Cephalexin Hives 09/06/2009  . Hydralazine Nausea And Vomiting 11/02/2012  . Iron Nausea And Vomiting 05/02/2009  . Ambien [zolpidem tartrate] Other (See Comments) 06/23/2015  . Lorazepam Other (See Comments) 03/17/2015  . Sulfonamide derivatives Hives 05/02/2009  . Zolpidem  06/23/2015  . Penicillins Other (See Comments) 05/02/2009  . Shellfish allergy Hives  and Rash 03/17/2015  . Tape Rash 12/19/2014    Family History  Problem Relation Age of Onset  . Heart failure Mother   . Lung cancer Sister   . Lung cancer Brother   . Stomach cancer Brother   . Heart attack Neg Hx   . Stroke Neg Hx   . Colon cancer Neg Hx     Social History   Social History  . Marital Status: Widowed    Spouse Name: N/A  . Number of Children: N/A  . Years of Education: N/A   Occupational History  . Not on file.   Social History Main Topics  . Smoking status: Former Smoker    Types: Cigarettes  . Smokeless tobacco: Never Used  . Alcohol Use: No  . Drug Use: No  . Sexual Activity: Not on file   Other Topics Concern  . Not on file   Social History Narrative    Review of Systems: Gen: see HPI  CV: Denies chest pain, heart palpitations, syncope, edema  Resp: Denies shortness of breath with rest, cough, wheezing GI: see HPI  GU : Denies urinary burning, urinary frequency, urinary incontinence.  MS: right leg paralysis  Derm: Denies rash, itching, dry skin Psych: Denies depression, anxiety,confusion, or memory loss Heme: Denies bruising, bleeding, and enlarged lymph nodes.  Physical Exam: Vital signs in last 24 hours: Temp:  [98.2 F (36.8 C)-98.4 F (36.9 C)] 98.2 F (36.8 C) (06/27 1423) Pulse Rate:  [67-94] 85 (06/27 1423) Resp:  [18] 18 (06/27 1423) BP: (107-143)/(55-71) 128/61 mmHg (06/27 1423) SpO2:  [97 %-100 %] 97 %  (06/27 1423) Weight:  [130 lb 11.7 oz (59.3 kg)-132 lb (59.875 kg)] 130 lb 11.7 oz (59.3 kg) (06/27 0514) Last BM Date: 11/27/15 General:   Alert, appears chronically ill Head:  Normocephalic and atraumatic. Eyes:  Sclera clear, no icterus.   Conjunctiva pink. Ears:  Mild to moderate hard of hearing Nose:  No deformity, discharge,  or lesions. Mouth:  No deformity or lesions Lungs:  Clear throughout to auscultation.    Heart:  Regular rate and rhythm Abdomen:  Soft, nontender and nondistended. No masses, hepatosplenomegaly or hernias noted. Normal bowel sounds, without guarding, and without rebound.   Rectal:  Deferred until time of colonoscopy.   Msk:  Right pedal edema moreso than left, right lower extremity paralysis. Neurologic:  Alert and  oriented x4 Psych:  Alert and cooperative. Normal mood and affect.  Intake/Output from previous day:   Intake/Output this shift: Total I/O In: 215 [I.V.:165; IV Piggyback:50] Out: 800 [Urine:800]  Lab Results:  Recent Labs  11/28/15 0110 11/28/15 0538  WBC 7.2 7.6  HGB 11.3* 9.9*  HCT 33.1* 28.8*  PLT 210 200   BMET  Recent Labs  11/28/15 0110 11/28/15 0538 11/28/15 1113  NA 135 133* 137  K 2.2* 2.9* 3.4*  CL 103 105 107  CO2 19* 18* 19*  GLUCOSE 157* 135* 135*  BUN 69* 65* 63*  CREATININE 4.10* 3.82* 3.81*  CALCIUM 7.6* 7.2* 7.9*   LFT  Recent Labs  11/28/15 0110  PROT 6.0*  ALBUMIN 2.6*  AST 12*  ALT 8*  ALKPHOS 102  BILITOT 0.6   Studies/Results: Ct Abdomen Pelvis Wo Contrast  11/28/2015  CLINICAL DATA:  Gradually worsening nausea vomiting and diarrhea, onset 2 weeks ago but worsened tonight. EXAM: CT ABDOMEN AND PELVIS WITHOUT CONTRAST TECHNIQUE: Multidetector CT imaging of the abdomen and pelvis was performed following the standard protocol without IV contrast.  COMPARISON:  06/14/2015 FINDINGS: Lower chest: Posterior right lung base opacity, possibly rounded atelectasis, not significantly changed from  09/20/2015. No acute findings in the lower chest. Hepatobiliary: No focal liver lesion is evident on this unenhanced scan. Cholecystectomy. Unremarkable bile ducts. Pancreas: Unremarkable unenhanced appearances of the pancreas. Spleen: No significant abnormality Adrenals/Urinary Tract: Adrenals are normal. Kidneys are atrophic. Indeterminate 1.6 cm upper pole right renal lesion, unchanged. Satisfactorily positioned right ureteral stent. Urinary bladder is decompressed around a Foley catheter. Stomach/Bowel: No bowel obstruction. No acute inflammatory changes. Stomach, small bowel and colon are unremarkable. Vascular/Lymphatic: Extensive aortic calcification. Generous caliber of the suprarenal abdominal aorta, unchanged. No discrete aneurysm. Reproductive: No adnexal abnormality.  Hysterectomy. Other: No focal inflammatory changes are evident in the abdomen or pelvis. No extraluminal air. No ascites. Musculoskeletal: The sacral metastasis is smaller, with reparative sclerosis. The L5 and L4 destructive lesions also now exhibit reparative sclerosis. No new skeletal lesion is evident. The right paraspinal dorsal soft tissue collection has not recurred. It was drained on 06/17/2015. IMPRESSION: No acute findings are evident in the abdomen or pelvis. The known skeletal metastatic disease is stable or reduced, with evidence of reparative sclerosis. The right paraspinous dorsal soft tissue collection has resolved. Electronically Signed   By: Andreas Newport M.D.   On: 11/28/2015 03:53    Impression: 82 year old female admitted with renal failure in the setting of persistent diarrhea for several weeks. Notable history of recurrent metastatic non-small cell lung cancer, undergoing radiation last year. Last colonoscopy in the 1980s. With immunocompromised state, need to rule out infectious process; agree with stool studies. Recommend supportive measures for now and follow-up stool results as they come available. If  negative stool studies and persistent diarrhea, would pursue colonoscopy.   Plan: Low-residue diet Await stool studies Supportive care Will continue to follow with you  Orvil Feil, ANP-BC [facility]     LOS: 0 days    11/28/2015, 5:47 PM

## 2015-11-28 NOTE — ED Notes (Signed)
Pt c/o n/v/d since last night with very little po intake.

## 2015-11-28 NOTE — ED Notes (Signed)
Pt refuses her morphine stating that she does not want it - morphine is returned to [facility] with Dewaine Oats, RN witness

## 2015-11-28 NOTE — ED Notes (Signed)
Attempt to call report, floor unaware of admission

## 2015-11-28 NOTE — ED Notes (Signed)
Hospitalist in to assess- pt does not report relief of pain after meds. Per CT tech, hard to move pt with ivs infusing- with Dr Marguerita Beards permission IVs stopped until pt returns from CT

## 2015-11-29 DIAGNOSIS — R109 Unspecified abdominal pain: Secondary | ICD-10-CM | POA: Insufficient documentation

## 2015-11-29 DIAGNOSIS — E876 Hypokalemia: Secondary | ICD-10-CM

## 2015-11-29 DIAGNOSIS — C7951 Secondary malignant neoplasm of bone: Secondary | ICD-10-CM

## 2015-11-29 DIAGNOSIS — E860 Dehydration: Secondary | ICD-10-CM

## 2015-11-29 LAB — BASIC METABOLIC PANEL
ANION GAP: 8 (ref 5–15)
BUN: 58 mg/dL — ABNORMAL HIGH (ref 6–20)
CHLORIDE: 99 mmol/L — AB (ref 101–111)
CO2: 23 mmol/L (ref 22–32)
CREATININE: 3.54 mg/dL — AB (ref 0.44–1.00)
Calcium: 7.4 mg/dL — ABNORMAL LOW (ref 8.9–10.3)
GFR calc non Af Amer: 11 mL/min — ABNORMAL LOW (ref >60)
GFR, EST AFRICAN AMERICAN: 13 mL/min — AB (ref >60)
Glucose, Bld: 132 mg/dL — ABNORMAL HIGH (ref 65–99)
Potassium: 3.6 mmol/L (ref 3.5–5.1)
Sodium: 130 mmol/L — ABNORMAL LOW (ref 135–145)

## 2015-11-29 LAB — CBC
HCT: 27.2 % — ABNORMAL LOW (ref 36.0–46.0)
HEMOGLOBIN: 9.3 g/dL — AB (ref 12.0–15.0)
MCH: 28.3 pg (ref 26.0–34.0)
MCHC: 34.2 g/dL (ref 30.0–36.0)
MCV: 82.7 fL (ref 78.0–100.0)
Platelets: 173 10*3/uL (ref 150–400)
RBC: 3.29 MIL/uL — AB (ref 3.87–5.11)
RDW: 14.4 % (ref 11.5–15.5)
WBC: 8.5 10*3/uL (ref 4.0–10.5)

## 2015-11-29 LAB — URINE CULTURE

## 2015-11-29 MED ORDER — SODIUM CHLORIDE 0.9 % IV SOLN
INTRAVENOUS | Status: DC
  Administered 2015-11-29 – 2015-11-30 (×2): via INTRAVENOUS

## 2015-11-29 MED ORDER — SODIUM BICARBONATE 8.4 % IV SOLN
INTRAVENOUS | Status: DC
  Filled 2015-11-29 (×3): qty 850

## 2015-11-29 MED ORDER — CYCLOBENZAPRINE HCL 10 MG PO TABS
5.0000 mg | ORAL_TABLET | Freq: Three times a day (TID) | ORAL | Status: DC | PRN
  Administered 2015-11-29 – 2015-11-30 (×3): 5 mg via ORAL
  Filled 2015-11-29 (×3): qty 1

## 2015-11-29 NOTE — Evaluation (Signed)
Physical Therapy Evaluation Patient Details Name: [name] MRN: [id number] DOB: Jun 17, 1933 Today's Date: 11/29/2015   History of Present Illness  81 yo F admitted with intermittent diarrhea, and weakness. Dx: UTI, and possibly radiation enterocolitis, worsening renal failure, severe hypokalemia and persistent diarrhea. PMH: NSC lung CA, chronic intermittent diarrhea for the past year, metastatic disease to her spine with radiation, CKD, HTN, ischemic CMP, CAD, GERD, chronic anemia, ischemic cardiomyopathy, Afib, DVT, chronic diastolic heart failure, hematoma evacuation, appendectomy, laminectomy.   Clinical Impression  Pt received in bed, and was agreeable to PT evaluation.  Dtr and grandson both present for evaluation.  Pt requires assistance for all aspects of functional mobility at baseline, however, she does not have 24/7 care.  Today, pt was able to perform supine<>sit EOB with Min A, and sit<>stand with Mod A.  Standing balance is impaired due to posterior lean, as well as R ankle contracture into plantar flexion.  At this point, if pt returns home, she will need 24/7 caregivers.  Dtr is aware, and reports already speaking to case management about this.  Recommend she return home with HHPT and 24/7 supervision/assistance.     Follow Up Recommendations Home health PT;Supervision/Assistance - 24 hour    Equipment Recommendations  None recommended by PT    Recommendations for Other Services       Precautions / Restrictions Restrictions Weight Bearing Restrictions: No      Mobility  Bed Mobility Overal bed mobility: Needs Assistance Bed Mobility: Supine to Sit     Supine to sit: HOB elevated;Min assist     General bed mobility comments: Min A to scoot hips to the EOB via bed pad.   Transfers Overall transfer level: Needs assistance Equipment used: Rolling walker (2 wheeled) Transfers: Sit to/from Stand Sit to Stand: Mod assist         General transfer comment:  with pt pulling on the RW.  Pt states at home, she pulls up on her physical therapist.  Pt needs continued Mod A due to posterior lean.  Pt quickly sat down on the EOB without reaching back.  PT educated pt to let PT know when she needed to sit down next time.   Ambulation/Gait Ambulation/Gait assistance:  (NA)              Stairs            Wheelchair Mobility    Modified Rankin (Stroke Patients Only)       Balance Overall balance assessment: Needs assistance Sitting-balance support: Bilateral upper extremity supported Sitting balance-Leahy Scale: Fair     Standing balance support: Bilateral upper extremity supported Standing balance-Leahy Scale: Poor Standing balance comment: Standing lateral weight shfits to improve R ankle ROM x 5.  Pt able to stand with assistance for a total of 1 minute                             Pertinent Vitals/Pain Pain Assessment: No/denies pain    Home Living   Living Arrangements: Alone Available Help at Discharge: Family;Personal care attendant;Available PRN/intermittently (home health aide 2x's/wk, dtr comes every day, and grandson comes every day,  HHPT with [place].  ) Type of Home: Mobile home Home Access: Stairs to enter;Ramped entrance   Entrance Stairs-Number of Steps: 6 Home Layout: One level Home Equipment: [place] - single point;Walker - standard;Transport chair;Bedside commode;Hospital bed;Wheelchair - manual Additional Comments: Pt is home alone for portions of  the day.      Prior Function Level of [place]: Needs assistance   Gait / Transfers Assistance Needed: Pt reports that she requires assistance for transfers in/out of the w/c.    ADL's / Homemaking Assistance Needed: Dtr assists with sponge baths, as well as getting her dressed.         Hand Dominance        Extremity/Trunk Assessment   Upper Extremity Assessment: Generalized weakness           Lower Extremity Assessment:  Generalized weakness;RLE deficits/detail RLE Deficits / Details: R ankle plantar flexed contracture - Dtr states it has been that way for ~1year now - due to not getting OOB       Communication      Cognition Arousal/Alertness: Awake/alert Behavior During Therapy: WFL for tasks assessed/performed Overall Cognitive Status: Within Functional Limits for tasks assessed (Needs repetition at times )       Memory: Decreased short-term memory              General Comments      Exercises        Assessment/Plan    PT Assessment Patient needs continued PT services  PT Diagnosis Difficulty walking;Abnormality of gait;Generalized weakness   PT Problem List Decreased strength;Decreased range of motion;Decreased activity tolerance;Decreased balance;Decreased mobility;Decreased cognition;Decreased knowledge of use of DME;Decreased safety awareness;Decreased knowledge of precautions;Pain;Decreased skin integrity  PT Treatment Interventions DME instruction;Gait training;Functional mobility training;Therapeutic activities;Therapeutic exercise;Balance training;Neuromuscular re-education;Cognitive remediation;Patient/family education;Wheelchair mobility training   PT Goals (Current goals can be found in the Care Plan section) Acute Rehab PT Goals Patient Stated Goal: Pt wants to go home.  PT Goal Formulation: With patient/family Time For Goal Achievement: 12/06/15 Potential to Achieve Goals: Fair    Frequency Min 4X/week   Barriers to discharge Decreased caregiver support Pt only has intermittent caregivers at this point.  She will need 24/7 care.     Co-evaluation               End of Session Equipment Utilized During Treatment: Gait belt Activity Tolerance: Patient tolerated treatment well Patient left: in bed;with call bell/phone within reach      Functional Assessment Tool Used: [employer]	[place] "6-clicks"  Functional Limitation: Mobility: Walking and moving  around Mobility: Walking and Moving Around Current Status [phone]): At least 60 percent but less than 80 percent impaired, limited or restricted Mobility: Walking and Moving Around Goal Status [phone]): At least 40 percent but less than 60 percent impaired, limited or restricted    Time: 1115-1143 PT Time Calculation (min) (ACUTE ONLY): 28 min   Charges:   PT Evaluation $PT Eval Moderate Complexity: 1 Procedure PT Treatments $Therapeutic Activity: 8-22 mins   PT G Codes:   PT G-Codes **NOT FOR INPATIENT CLASS** Functional Assessment Tool Used: [employer]	[place] AM PAC "6-clicks"  Functional Limitation: Mobility: Walking and moving around Mobility: Walking and Moving Around Current Status [phone]): At least 60 percent but less than 80 percent impaired, limited or restricted Mobility: Walking and Moving Around Goal Status [phone]): At least 40 percent but less than 60 percent impaired, limited or restricted   Beth Rudie Rikard, PT, DPT X: [phone]

## 2015-11-29 NOTE — Progress Notes (Signed)
REVIEWED-NO ADDITIONAL RECOMMENDATIONS.   Subjective: States last BM 6/26. No diarrhea yesterday but lots of gas. Abdominal pain resolved. Feels much better.  Objective: Vital signs in last 24 hours: Temp:  [98.2 F (36.8 C)-98.7 F (37.1 C)] 98.2 F (36.8 C) (06/28 0620) Pulse Rate:  [80-85] 80 (06/28 0620) Resp:  [18] 18 (06/28 0620) BP: (111-128)/(58-64) 111/58 mmHg (06/28 0620) SpO2:  [97 %-100 %] 100 % (06/28 0620) Last BM Date: 11/27/15 General:   Alert and oriented, pleasant, chronically-ill appearing Head:  Normocephalic and atraumatic. Abdomen:  Bowel sounds present, soft, non-tender, non-distended. No HSM or hernias noted.  Neurologic:  Alert and  oriented x4 Psych:  Alert and cooperative. Normal mood and affect.  Intake/Output from previous day: 06/27 0701 - 06/28 0700 In: 215 [I.V.:165; IV Piggyback:50] Out: 1500 [Urine:1500] Intake/Output this shift:    Lab Results:  Recent Labs  11/28/15 0110 11/28/15 0538 11/29/15 0548  WBC 7.2 7.6 8.5  HGB 11.3* 9.9* 9.3*  HCT 33.1* 28.8* 27.2*  PLT 210 200 173   BMET  Recent Labs  11/28/15 0538 11/28/15 1113 11/29/15 0548  NA 133* 137 130*  K 2.9* 3.4* 3.6  CL 105 107 99*  CO2 18* 19* 23  GLUCOSE 135* 135* 132*  BUN 65* 63* 58*  CREATININE 3.82* 3.81* 3.54*  CALCIUM 7.2* 7.9* 7.4*   LFT  Recent Labs  11/28/15 0110  PROT 6.0*  ALBUMIN 2.6*  AST 12*  ALT 8*  ALKPHOS 102  BILITOT 0.6     Studies/Results: Ct Abdomen Pelvis Wo Contrast  11/28/2015  CLINICAL DATA:  Gradually worsening nausea vomiting and diarrhea, onset 2 weeks ago but worsened tonight. EXAM: CT ABDOMEN AND PELVIS WITHOUT CONTRAST TECHNIQUE: Multidetector CT imaging of the abdomen and pelvis was performed following the standard protocol without IV contrast. COMPARISON:  06/14/2015 FINDINGS: Lower chest: Posterior right lung base opacity, possibly rounded atelectasis, not significantly changed from 09/20/2015. No acute findings in  the lower chest. Hepatobiliary: No focal liver lesion is evident on this unenhanced scan. Cholecystectomy. Unremarkable bile ducts. Pancreas: Unremarkable unenhanced appearances of the pancreas. Spleen: No significant abnormality Adrenals/Urinary Tract: Adrenals are normal. Kidneys are atrophic. Indeterminate 1.6 cm upper pole right renal lesion, unchanged. Satisfactorily positioned right ureteral stent. Urinary bladder is decompressed around a Foley catheter. Stomach/Bowel: No bowel obstruction. No acute inflammatory changes. Stomach, small bowel and colon are unremarkable. Vascular/Lymphatic: Extensive aortic calcification. Generous caliber of the suprarenal abdominal aorta, unchanged. No discrete aneurysm. Reproductive: No adnexal abnormality.  Hysterectomy. Other: No focal inflammatory changes are evident in the abdomen or pelvis. No extraluminal air. No ascites. Musculoskeletal: The sacral metastasis is smaller, with reparative sclerosis. The L5 and L4 destructive lesions also now exhibit reparative sclerosis. No new skeletal lesion is evident. The right paraspinal dorsal soft tissue collection has not recurred. It was drained on 06/17/2015. IMPRESSION: No acute findings are evident in the abdomen or pelvis. The known skeletal metastatic disease is stable or reduced, with evidence of reparative sclerosis. The right paraspinous dorsal soft tissue collection has resolved. Electronically Signed   By: Andreas Newport M.D.   On: 11/28/2015 03:53    Assessment: 82 year old female admitted with renal failure in the setting of persistent diarrhea for several weeks. Notable history of recurrent metastatic non-small cell lung cancer, undergoing radiation last year. Found to have UTI. Much improved today, with resolution of diarrhea and abdominal pain. Likely UTI as culprit for multiple symptoms. If recurrent diarrhea, obtain stool studies. Recommend avoidance of invasive  measures and trial empiric management to  include avoidance of dairy, daily probiotic, and imodium daily.    Plan: Stool studies if recurrent diarrhea Probiotic and Imodium daily Avoid dairy Hopeful discharge in near future if continues to improve    Orvil Feil, ANP-BC [facility]     LOS: 1 day    11/29/2015, 8:12 AM

## 2015-11-29 NOTE — Care Management Important Message (Signed)
Important Message  Patient Details  Name: [name] MRN: [id number] Date of Birth: 1933-09-20   Medicare Important Message Given:  Yes    Tyrick Dunagan, Chauncey [place], RN 11/29/2015, 11:03 AM

## 2015-11-29 NOTE — Progress Notes (Signed)
PROGRESS NOTE    [name]  RAQ:[id number] DOB: July 09, 1933 DOA: 11/28/2015 PCP: Wende Neighbors, MD Outpatient Specialists:  Oncology; Dr. Julien Nordmann  Brief Narrative:  29 yof presented with complaints of diarrhea. She was found to have acute renal failure and hypokalemia. She has been started on IVFs and potassium supplementation. GI has been consulted. Will follow up stool studies.    Assessment & Plan:   Active Problems:   Lung cancer stage IV with Metastatic squamous cell carcinoma to bone (HCC)   Acute renal failure superimposed on stage 3 chronic kidney disease (HCC)   Hypokalemia   Diarrhea   UTI (lower urinary tract infection)   Dehydration   Acute on chronic renal failure (HCC)  1. Acute renal failure on CKD stage 3. Secondary to hypovolemia. Improving slowly. Continue IVFs and repeat bloodwork in AM. 2. Hypokalemia. Secondary to GI losses. Improved with supplementation.  3. Diarrhea. GI following, no plans for invasive workup at tis time. Symptoms appear to have improved. Felt to be possibly related to UTI. Continue supportive management.  Follow up stool studies. 4. UTI. Continue IV rocephin. UCx showed multiple species. Will give another day of Rocephin, and likely discontinue abx after tomorrows dose.  5. Lung cancer stage 4 with Metastatic squamous cell carcinoma to bone. Follow up with oncology 6. Afib. CHADS-VASc 4. Currently rate controlled. She was prescribed Xarelto as an outpatient, but has not taken anticoagulation since April 2017, due to hx of significant bleeding. She will need to discuss restarting anticoagulation with her PCP/oncologist. 7. Chronic diastolic CHF. Appears compensated at this point. Monitor in the setting of IV hydration.  8. Hx of DVT. Patient had bilateral LE DVT in 6/16. She was treated until April 2017 with anticoagulation, which was stopped due to significant bleeding. She will follow up with her oncologist to discuss reinitiating her  anticoagulation.     DVT prophylaxis: Lovenox Code Status: Full Family Communication: discussed with patient and daughter at bedside. Disposition Plan: Anticipate discharge home once improved, likely tomorrow.    Consultants:   GI  Procedures:   none  Antimicrobials:   Rocephin 6/27 >>    Subjective: Feels improved. Has not had a bowel movement since arriving in the hospital. She has not had any vomiting. No stomach pain. Breathing and urination have been good. She complains of pain in her ankle. Ankle pain is chronic, but appears to have flared up overnight.   Objective: Filed Vitals:   11/28/15 0330 11/28/15 0514 11/28/15 1423 11/28/15 2132  BP: 107/61 115/57 128/61 118/64  Pulse: 73 67 85 82  Temp:  98.4 F (36.9 C) 98.2 F (36.8 C) 98.7 F (37.1 C)  TempSrc:  Oral  Oral  Resp:  '18 18 18  '$ Height:  '5\' 8"'$  (1.727 m)    Weight:  59.3 kg (130 lb 11.7 oz)    SpO2: 100% 100% 97% 100%    Intake/Output Summary (Last 24 hours) at 11/29/15 0620 Last data filed at 11/28/15 1735  Gross per 24 hour  Intake    215 ml  Output    800 ml  Net   -585 ml   Filed Weights   11/28/15 0033 11/28/15 0514  Weight: 59.875 kg (132 lb) 59.3 kg (130 lb 11.7 oz)    Examination: General exam: Appears calm and comfortable  Respiratory system: Clear to auscultation. Respiratory effort normal. Cardiovascular system: S1 & S2 heard, RRR. No JVD, murmurs, rubs, gallops or clicks. RLE has trace edema (chronic). No  edema in LLE. Gastrointestinal system: Abdomen is nondistended, soft and nontender. No organomegaly or masses felt. Normal bowel sounds heard. Central nervous system: Alert and oriented. No focal neurological deficits. Extremities: Symmetric 5 x 5 power. Skin: No rashes, lesions or ulcers Psychiatry: Judgement and insight appear normal. Mood & affect appropriate.   Data Reviewed: I have personally reviewed following labs and imaging studies  CBC:  Recent Labs Lab  11/28/15 0110 11/28/15 0538  WBC 7.2 7.6  NEUTROABS 5.7  --   HGB 11.3* 9.9*  HCT 33.1* 28.8*  MCV 81.7 82.3  PLT 210 277   Basic Metabolic Panel:  Recent Labs Lab 11/28/15 0110 11/28/15 0538 11/28/15 1113  NA 135 133* 137  K 2.2* 2.9* 3.4*  CL 103 105 107  CO2 19* 18* 19*  GLUCOSE 157* 135* 135*  BUN 69* 65* 63*  CREATININE 4.10* 3.82* 3.81*  CALCIUM 7.6* 7.2* 7.9*  MG 1.5*  --   --    GFR: Estimated Creatinine Clearance: 11 mL/min (by C-G formula based on Cr of 3.81). Liver Function Tests:  Recent Labs Lab 11/28/15 0110  AST 12*  ALT 8*  ALKPHOS 102  BILITOT 0.6  PROT 6.0*  ALBUMIN 2.6*    Recent Labs Lab 11/28/15 0110  LIPASE 21   Thyroid Function Tests:  Recent Labs  11/28/15 0538  TSH 3.508   Anemia Panel: No results for input(s): VITAMINB12, FOLATE, FERRITIN, TIBC, IRON, RETICCTPCT in the last 72 hours. Urine analysis:    Component Value Date/Time   COLORURINE YELLOW 11/28/2015 0158   APPEARANCEUR HAZY* 11/28/2015 0158   LABSPEC <1.005* 11/28/2015 0158   PHURINE 7.0 11/28/2015 0158   GLUCOSEU NEGATIVE 11/28/2015 0158   GLUCOSEU NEGATIVE 11/27/2009 1014   HGBUR SMALL* 11/28/2015 0158   BILIRUBINUR NEGATIVE 11/28/2015 0158   KETONESUR NEGATIVE 11/28/2015 0158   PROTEINUR TRACE* 11/28/2015 0158   UROBILINOGEN 0.2 03/18/2015 1330   NITRITE POSITIVE* 11/28/2015 0158   LEUKOCYTESUR LARGE* 11/28/2015 0158   Sepsis Labs: '@LABRCNTIP'$ (procalcitonin:4,lacticidven:4)  )No results found for this or any previous visit (from the past 240 hour(s)).   Radiology Studies: Ct Abdomen Pelvis Wo Contrast  11/28/2015  CLINICAL DATA:  Gradually worsening nausea vomiting and diarrhea, onset 2 weeks ago but worsened tonight. EXAM: CT ABDOMEN AND PELVIS WITHOUT CONTRAST TECHNIQUE: Multidetector CT imaging of the abdomen and pelvis was performed following the standard protocol without IV contrast. COMPARISON:  06/14/2015 FINDINGS: Lower chest: Posterior right  lung base opacity, possibly rounded atelectasis, not significantly changed from 09/20/2015. No acute findings in the lower chest. Hepatobiliary: No focal liver lesion is evident on this unenhanced scan. Cholecystectomy. Unremarkable bile ducts. Pancreas: Unremarkable unenhanced appearances of the pancreas. Spleen: No significant abnormality Adrenals/Urinary Tract: Adrenals are normal. Kidneys are atrophic. Indeterminate 1.6 cm upper pole right renal lesion, unchanged. Satisfactorily positioned right ureteral stent. Urinary bladder is decompressed around a Foley catheter. Stomach/Bowel: No bowel obstruction. No acute inflammatory changes. Stomach, small bowel and colon are unremarkable. Vascular/Lymphatic: Extensive aortic calcification. Generous caliber of the suprarenal abdominal aorta, unchanged. No discrete aneurysm. Reproductive: No adnexal abnormality.  Hysterectomy. Other: No focal inflammatory changes are evident in the abdomen or pelvis. No extraluminal air. No ascites. Musculoskeletal: The sacral metastasis is smaller, with reparative sclerosis. The L5 and L4 destructive lesions also now exhibit reparative sclerosis. No new skeletal lesion is evident. The right paraspinal dorsal soft tissue collection has not recurred. It was drained on 06/17/2015. IMPRESSION: No acute findings are evident in the abdomen or pelvis.  The known skeletal metastatic disease is stable or reduced, with evidence of reparative sclerosis. The right paraspinous dorsal soft tissue collection has resolved. Electronically Signed   By: Andreas Newport M.D.   On: 11/28/2015 03:53    Scheduled Meds: . acidophilus  2 capsule Oral Q breakfast  . cefTRIAXone (ROCEPHIN)  IV  1 g Intravenous Q24H  . collagenase  1 application Topical Daily  . enoxaparin (LOVENOX) injection  30 mg Subcutaneous Q24H  . feeding supplement (ENSURE ENLIVE)  237 mL Oral BID BM  . gabapentin  100 mg Oral QHS  . loperamide  4 mg Oral QAC breakfast  .  magnesium oxide  400 mg Oral Daily  . pantoprazole  40 mg Oral Daily  . potassium chloride  40 mEq Oral Daily  . sodium chloride flush  3 mL Intravenous Q12H   Continuous Infusions: . dextrose 5 % 1,000 mL with sodium bicarbonate 150 mEq infusion 100 mL/hr at 11/29/15 0439     LOS: 1 day    Time spent: 25 minutes  Kathie Dike, MD [facility] Pager [phone]  If 7PM-7AM, please contact night-coverage [email or web address] Password [email or web address] 11/29/2015, 6:20 AM   By signing my name below, I, Delene Ruffini, attest that this documentation has been prepared under the direction and in the presence of Kathie Dike, MD. Electronically Signed: Delene Ruffini 11/29/2015 12:45pm  I, Dr. Kathie Dike, personally performed the services described in this documentaiton. All medical record entries made by the scribe were at my direction and in my presence. I have reviewed the chart and agree that the record reflects my personal performance and is accurate and complete  Kathie Dike, MD, 11/29/2015 1:26 PM

## 2015-11-29 NOTE — Care Management Note (Signed)
Case Management Note  Patient Details  Name: [name] MRN: [id number] Date of Birth: 1934/03/31     Expected Discharge Date:       11/30/2015           Expected Discharge Plan:  [place]  In-House Referral:  NA  Discharge planning Services  CM Consult  Post Acute Care Choice:  Home Health Choice offered to:  Patient  DME Arranged:    DME Agency:     HH Arranged:    [place]:  [facility] (now [facility])  Status of Service:  In process, will continue to follow  If discussed at Long Length of Stay Meetings, dates discussed:    Additional Comments: Patient's daughter requesting list of Private duty agencies. Patient currently has [place] home health. List provided.   Lenna Hagarty, Chauncey [place], RN 11/29/2015, 1:09 PM

## 2015-11-29 NOTE — Progress Notes (Signed)
Notified NP, pt requesting to take PRN Flexeril early. Pt refuses Gabapentin and Morphine due to side effects. NP states ok to give Flexeril early.

## 2015-11-30 DIAGNOSIS — N390 Urinary tract infection, site not specified: Secondary | ICD-10-CM

## 2015-11-30 DIAGNOSIS — N183 Chronic kidney disease, stage 3 (moderate): Secondary | ICD-10-CM

## 2015-11-30 LAB — BASIC METABOLIC PANEL
Anion gap: 7 (ref 5–15)
BUN: 55 mg/dL — AB (ref 6–20)
CALCIUM: 7.6 mg/dL — AB (ref 8.9–10.3)
CHLORIDE: 101 mmol/L (ref 101–111)
CO2: 27 mmol/L (ref 22–32)
CREATININE: 3.26 mg/dL — AB (ref 0.44–1.00)
GFR calc non Af Amer: 12 mL/min — ABNORMAL LOW (ref >60)
GFR, EST AFRICAN AMERICAN: 14 mL/min — AB (ref >60)
GLUCOSE: 88 mg/dL (ref 65–99)
Potassium: 4 mmol/L (ref 3.5–5.1)
Sodium: 135 mmol/L (ref 135–145)

## 2015-11-30 MED ORDER — FUROSEMIDE 40 MG PO TABS: 40.0000 mg | ORAL_TABLET | Freq: Every day | ORAL | Status: DC

## 2015-11-30 MED ORDER — MILK AND MOLASSES ENEMA
1.0000 | Freq: Once | RECTAL | Status: AC
  Administered 2015-11-30: 250 mL via RECTAL

## 2015-11-30 NOTE — Progress Notes (Signed)
[facility] to get patient at this time. Report given to EMS, [facility] cath de accessed and chronic foley left in place per MD Memon.  Patient provided discharge instructions and follow up appointments, no further questions at this time.  Will continue to monitor the patient closely.

## 2015-11-30 NOTE — Discharge Summary (Signed)
Physician Discharge Summary  [name] SWF:[id number] DOB: 1933-06-09 DOA: 11/28/2015  PCP: Wende Neighbors, MD  Admit date: 11/28/2015 Discharge date: 11/30/2015  Admitted From: home Disposition:  home  Recommendations for Outpatient Follow-up:  1. Follow up with PCP in 1-2 weeks 2. Please obtain BMP/CBC in one week 3. Follow-up with outpatient GI for stool studies if diarrhea recurs.  4. Follow up with oncology to discuss resumption of anticoagulation  Home Health: Yes Equipment/Devices: none  Discharge Condition: Improved CODE STATUS: Full Diet recommendation: Heart healthy  Brief/Interim Summary: 28 yof presented with complaints of persistent diarrhea. She was found to have acute on chronic  renal failure and hypokalemia for which she was started on IVFs and potassium supplementation. Etiology of her diarrhea was not entirely clear. Stool studies were ordered, but she did not have any further bowel movements since admission. GI has been consulted and recommended avoidance of invasive measures and trial empiric management to include avoidance of dairy, daily probiotic, and imodium daily. Follow-up as outpatient for stool studies if diarrhea recurs. Since she was treated with IV fluids, her renal function is now approaching baseline. Will resume lasix in the next 3 days.  Patient was also found to have a possible UTI. Urine culture showed multiple organisms. She was treated with 3 days of ceftriaxone.  Family also reported a history of bilateral lower extremity DVTs which were diagnosed 6/16. She had been on daily lovenox injection and subsequently xarelto until 4/17, at which time it was discontinued due to bleeding episodes. With her history of cancer, she would likely need to be on lifelong anticoagulation. Follow up LE dopplers done in 9/16 did not show any evidence of DVT. She will follow up with her oncologist to determine further anticoagulation options.  Discharge Diagnoses:   Active Problems:   Lung cancer stage IV with Metastatic squamous cell carcinoma to bone (HCC)   Acute renal failure superimposed on stage 3 chronic kidney disease (HCC)   Hypokalemia   Diarrhea   UTI (lower urinary tract infection)   Dehydration   Acute on chronic renal failure (HCC)   Abdominal pain    Discharge Instructions      Discharge Instructions    Diet - low sodium heart healthy    Complete by:  As directed      Increase activity slowly    Complete by:  As directed             Medication List    STOP taking these medications        diltiazem 60 MG tablet  Commonly known as:  CARDIZEM     predniSONE 10 MG tablet  Commonly known as:  DELTASONE      TAKE these medications        acetaminophen 500 MG tablet  Commonly known as:  TYLENOL  Take 500 mg by mouth daily as needed for mild pain.     diphenhydrAMINE 25 mg capsule  Commonly known as:  BENADRYL  Take 1 capsule (25 mg total) by mouth every 6 (six) hours as needed.     diphenoxylate-atropine 2.5-0.025 MG tablet  Commonly known as:  LOMOTIL  Take by mouth 4 (four) times daily as needed for diarrhea or loose stools. Reported on 11/28/2015     furosemide 40 MG tablet  Commonly known as:  LASIX  Take 1 tablet (40 mg total) by mouth daily. Restart in 3 days     gabapentin 100 MG capsule  Commonly known as:  NEURONTIN  Take 1 capsule (100 mg total) by mouth at bedtime.     lidocaine-prilocaine cream  Commonly known as:  EMLA  Apply 1 application topically as needed.     loperamide 2 MG capsule  Commonly known as:  IMODIUM  Take 1 capsule (2 mg total) by mouth every 6 (six) hours as needed for diarrhea or loose stools.     metoprolol tartrate 25 MG tablet  Commonly known as:  LOPRESSOR  Take 0.5 tablets (12.5 mg total) by mouth 2 (two) times daily.     pantoprazole 40 MG tablet  Commonly known as:  PROTONIX  Take 40 mg by mouth at bedtime.     SANTYL ointment  Generic drug:  collagenase   Apply 1 application topically at bedtime as needed (bed sores). For bed sores        Allergies  Allergen Reactions  . Amlodipine Swelling  . Ciprofloxacin Hives and Other (See Comments)    REACTION: weakness  . Mirtazapine Other (See Comments)    Hallucinations and nightmares, verbally aggressive   . Statins Other (See Comments)  . Aspirin Swelling    Mouth swelling, tongue Patient reports that she tolerates ibuprofen without problem  . Cephalexin Hives  . Hydralazine Nausea And Vomiting  . Iron Nausea And Vomiting  . Ambien [Zolpidem Tartrate] Other (See Comments)    confusion  . Lorazepam Other (See Comments)    Hallucinations, verbally aggressive  . Sulfonamide Derivatives Hives  . Zolpidem     Confusion  . Penicillins Other (See Comments)    Has patient had a PCN reaction causing immediate rash, facial/tongue/throat swelling, SOB or lightheadedness with hypotension: Yes Has patient had a PCN reaction causing severe rash involving mucus membranes or skin necrosis: Yes Has patient had a PCN reaction that required hospitalization No Has patient had a PCN reaction occurring within the last 10 years: No If all of the above answers are "NO", then may proceed with Cephalosporin use.   . Shellfish Allergy Hives and Rash  . Tape Rash    Consultations:  GI   Procedures/Studies: Ct Abdomen Pelvis Wo Contrast  11/28/2015  CLINICAL DATA:  Gradually worsening nausea vomiting and diarrhea, onset 2 weeks ago but worsened tonight. EXAM: CT ABDOMEN AND PELVIS WITHOUT CONTRAST TECHNIQUE: Multidetector CT imaging of the abdomen and pelvis was performed following the standard protocol without IV contrast. COMPARISON:  06/14/2015 FINDINGS: Lower chest: Posterior right lung base opacity, possibly rounded atelectasis, not significantly changed from 09/20/2015. No acute findings in the lower chest. Hepatobiliary: No focal liver lesion is evident on this unenhanced scan. Cholecystectomy.  Unremarkable bile ducts. Pancreas: Unremarkable unenhanced appearances of the pancreas. Spleen: No significant abnormality Adrenals/Urinary Tract: Adrenals are normal. Kidneys are atrophic. Indeterminate 1.6 cm upper pole right renal lesion, unchanged. Satisfactorily positioned right ureteral stent. Urinary bladder is decompressed around a Foley catheter. Stomach/Bowel: No bowel obstruction. No acute inflammatory changes. Stomach, small bowel and colon are unremarkable. Vascular/Lymphatic: Extensive aortic calcification. Generous caliber of the suprarenal abdominal aorta, unchanged. No discrete aneurysm. Reproductive: No adnexal abnormality.  Hysterectomy. Other: No focal inflammatory changes are evident in the abdomen or pelvis. No extraluminal air. No ascites. Musculoskeletal: The sacral metastasis is smaller, with reparative sclerosis. The L5 and L4 destructive lesions also now exhibit reparative sclerosis. No new skeletal lesion is evident. The right paraspinal dorsal soft tissue collection has not recurred. It was drained on 06/17/2015. IMPRESSION: No acute findings are evident in the abdomen or pelvis. The known  skeletal metastatic disease is stable or reduced, with evidence of reparative sclerosis. The right paraspinous dorsal soft tissue collection has resolved. Electronically Signed   By: Andreas Newport M.D.   On: 11/28/2015 03:53      Subjective: Hasn't had a bowel movement in several days, feels constipated.   Discharge Exam: Filed Vitals:   11/29/15 2100 11/30/15 0528  BP: 124/73 102/45  Pulse: 72 77  Temp: 98.9 F (37.2 C) 97.3 F (36.3 C)  Resp: 18 20   Filed Vitals:   11/29/15 0620 11/29/15 1300 11/29/15 2100 11/30/15 0528  BP: 111/58 105/54 124/73 102/45  Pulse: 80 73 72 77  Temp: 98.2 F (36.8 C) 98.5 F (36.9 C) 98.9 F (37.2 C) 97.3 F (36.3 C)  TempSrc: Oral Oral Oral Oral  Resp: '18 18 18 20  '$ Height:      Weight:      SpO2: 100% 99% 100% 95%   Examination:   General exam: Appears calm and comfortable  Respiratory system: Clear to auscultation. Respiratory effort normal. Cardiovascular system: S1 & S2 heard, RRR. No JVD, murmurs, rubs, gallops or clicks. Chronic RLE pedal edema. Gastrointestinal system: Abdomen is nondistended, soft and nontender. No organomegaly or masses felt. Normal bowel sounds heard. Central nervous system: Alert and oriented. . Skin: No rashes, lesions or ulcers Psychiatry: Judgement and insight appear normal. Mood & affect appropriate.    The results of significant diagnostics from this hospitalization (including imaging, microbiology, ancillary and laboratory) are listed below for reference.     Microbiology: Recent Results (from the past 240 hour(s))  Urine culture     Status: Abnormal   Collection Time: 11/28/15  1:58 AM  Result Value Ref Range Status   Specimen Description URINE, CLEAN CATCH  Final   Special Requests NONE  Final   Culture MULTIPLE SPECIES PRESENT, SUGGEST RECOLLECTION (A)  Final   Report Status 11/29/2015 FINAL  Final     Labs: BNP (last 3 results)  Recent Labs  07/25/15 2030  BNP 1610.9*   Basic Metabolic Panel:  Recent Labs Lab 11/28/15 0110 11/28/15 0538 11/28/15 1113 11/29/15 0548 11/30/15 0624  NA 135 133* 137 130* 135  K 2.2* 2.9* 3.4* 3.6 4.0  CL 103 105 107 99* 101  CO2 19* 18* 19* 23 27  GLUCOSE 157* 135* 135* 132* 88  BUN 69* 65* 63* 58* 55*  CREATININE 4.10* 3.82* 3.81* 3.54* 3.26*  CALCIUM 7.6* 7.2* 7.9* 7.4* 7.6*  MG 1.5*  --   --   --   --    Liver Function Tests:  Recent Labs Lab 11/28/15 0110  AST 12*  ALT 8*  ALKPHOS 102  BILITOT 0.6  PROT 6.0*  ALBUMIN 2.6*    Recent Labs Lab 11/28/15 0110  LIPASE 21   CBC:  Recent Labs Lab 11/28/15 0110 11/28/15 0538 11/29/15 0548  WBC 7.2 7.6 8.5  NEUTROABS 5.7  --   --   HGB 11.3* 9.9* 9.3*  HCT 33.1* 28.8* 27.2*  MCV 81.7 82.3 82.7  PLT 210 200 173   Thyroid function studies  Recent  Labs  11/28/15 0538  TSH 3.508   Anemia work up No results for input(s): VITAMINB12, FOLATE, FERRITIN, TIBC, IRON, RETICCTPCT in the last 72 hours. Urinalysis    Component Value Date/Time   COLORURINE YELLOW 11/28/2015 0158   APPEARANCEUR HAZY* 11/28/2015 0158   LABSPEC <1.005* 11/28/2015 0158   PHURINE 7.0 11/28/2015 0158   GLUCOSEU NEGATIVE 11/28/2015 0158   GLUCOSEU NEGATIVE  11/27/2009 1014   HGBUR SMALL* 11/28/2015 0158   BILIRUBINUR NEGATIVE 11/28/2015 0158   KETONESUR NEGATIVE 11/28/2015 0158   PROTEINUR TRACE* 11/28/2015 0158   UROBILINOGEN 0.2 03/18/2015 1330   NITRITE POSITIVE* 11/28/2015 0158   LEUKOCYTESUR LARGE* 11/28/2015 0158   Sepsis Labs Invalid input(s): PROCALCITONIN,  WBC,  LACTICIDVEN Microbiology Recent Results (from the past 240 hour(s))  Urine culture     Status: Abnormal   Collection Time: 11/28/15  1:58 AM  Result Value Ref Range Status   Specimen Description URINE, CLEAN CATCH  Final   Special Requests NONE  Final   Culture MULTIPLE SPECIES PRESENT, SUGGEST RECOLLECTION (A)  Final   Report Status 11/29/2015 FINAL  Final     Time coordinating discharge: Over 30 minutes  SIGNED: Kathie Dike, MD  [facility] 11/30/2015, 10:33 AM Pager [phone]  If 7PM-7AM, please contact night-coverage [email or web address] Password [email or web address]

## 2015-11-30 NOTE — Care Management Note (Signed)
Case Management Note  Patient Details  Name: [name] MRN: [id number] Date of Birth: [birth year]    Expected Discharge Date:       11/30/2015           Expected Discharge Plan:  [place]  In-House Referral:  NA  Discharge planning Services  CM Consult  Post Acute Care Choice:  Home Health Choice offered to:  Patient  DME Arranged:    DME Agency:     HH Arranged:    [place]:  [facility] ([facility])  Status of Service:  In process, will continue to follow  If discussed at Long Length of Stay Meetings, dates discussed:    Additional Comments: Patient discharging home today. EMS to transport patient. Per patient, family (son-in-law) will be at home to receive patient. EMS called, medical necessity form at nursing station.  [place] notified of patient discharge, resumption order placed.  Kenadi Miltner, [place]uncey [place], RN 11/30/2015, 3:38 PM

## 2015-12-03 DIAGNOSIS — I4891 Unspecified atrial fibrillation: Secondary | ICD-10-CM | POA: Diagnosis not present

## 2015-12-03 DIAGNOSIS — L89154 Pressure ulcer of sacral region, stage 4: Secondary | ICD-10-CM | POA: Diagnosis not present

## 2015-12-03 DIAGNOSIS — E440 Moderate protein-calorie malnutrition: Secondary | ICD-10-CM | POA: Diagnosis not present

## 2015-12-03 DIAGNOSIS — C7951 Secondary malignant neoplasm of bone: Secondary | ICD-10-CM | POA: Diagnosis not present

## 2015-12-03 DIAGNOSIS — C3490 Malignant neoplasm of unspecified part of unspecified bronchus or lung: Secondary | ICD-10-CM | POA: Diagnosis not present

## 2015-12-03 DIAGNOSIS — I82401 Acute embolism and thrombosis of unspecified deep veins of right lower extremity: Secondary | ICD-10-CM | POA: Diagnosis not present

## 2015-12-03 DIAGNOSIS — I5032 Chronic diastolic (congestive) heart failure: Secondary | ICD-10-CM | POA: Diagnosis not present

## 2015-12-03 DIAGNOSIS — I2510 Atherosclerotic heart disease of native coronary artery without angina pectoris: Secondary | ICD-10-CM | POA: Diagnosis not present

## 2015-12-03 DIAGNOSIS — I130 Hypertensive heart and chronic kidney disease with heart failure and stage 1 through stage 4 chronic kidney disease, or unspecified chronic kidney disease: Secondary | ICD-10-CM | POA: Diagnosis not present

## 2015-12-03 DIAGNOSIS — N183 Chronic kidney disease, stage 3 (moderate): Secondary | ICD-10-CM | POA: Diagnosis not present

## 2015-12-03 DIAGNOSIS — D638 Anemia in other chronic diseases classified elsewhere: Secondary | ICD-10-CM | POA: Diagnosis not present

## 2015-12-03 DIAGNOSIS — G8311 Monoplegia of lower limb affecting right dominant side: Secondary | ICD-10-CM | POA: Diagnosis not present

## 2015-12-05 DIAGNOSIS — I130 Hypertensive heart and chronic kidney disease with heart failure and stage 1 through stage 4 chronic kidney disease, or unspecified chronic kidney disease: Secondary | ICD-10-CM | POA: Diagnosis not present

## 2015-12-05 DIAGNOSIS — I82401 Acute embolism and thrombosis of unspecified deep veins of right lower extremity: Secondary | ICD-10-CM | POA: Diagnosis not present

## 2015-12-05 DIAGNOSIS — N183 Chronic kidney disease, stage 3 (moderate): Secondary | ICD-10-CM | POA: Diagnosis not present

## 2015-12-05 DIAGNOSIS — I4891 Unspecified atrial fibrillation: Secondary | ICD-10-CM | POA: Diagnosis not present

## 2015-12-05 DIAGNOSIS — G8311 Monoplegia of lower limb affecting right dominant side: Secondary | ICD-10-CM | POA: Diagnosis not present

## 2015-12-05 DIAGNOSIS — D638 Anemia in other chronic diseases classified elsewhere: Secondary | ICD-10-CM | POA: Diagnosis not present

## 2015-12-05 DIAGNOSIS — I5032 Chronic diastolic (congestive) heart failure: Secondary | ICD-10-CM | POA: Diagnosis not present

## 2015-12-05 DIAGNOSIS — I2510 Atherosclerotic heart disease of native coronary artery without angina pectoris: Secondary | ICD-10-CM | POA: Diagnosis not present

## 2015-12-05 DIAGNOSIS — C7951 Secondary malignant neoplasm of bone: Secondary | ICD-10-CM | POA: Diagnosis not present

## 2015-12-05 DIAGNOSIS — E440 Moderate protein-calorie malnutrition: Secondary | ICD-10-CM | POA: Diagnosis not present

## 2015-12-05 DIAGNOSIS — C3490 Malignant neoplasm of unspecified part of unspecified bronchus or lung: Secondary | ICD-10-CM | POA: Diagnosis not present

## 2015-12-05 DIAGNOSIS — L89154 Pressure ulcer of sacral region, stage 4: Secondary | ICD-10-CM | POA: Diagnosis not present

## 2015-12-06 DIAGNOSIS — I8291 Chronic embolism and thrombosis of unspecified vein: Secondary | ICD-10-CM | POA: Diagnosis not present

## 2015-12-06 DIAGNOSIS — L89153 Pressure ulcer of sacral region, stage 3: Secondary | ICD-10-CM | POA: Diagnosis not present

## 2015-12-06 DIAGNOSIS — D0220 Carcinoma in situ of unspecified bronchus and lung: Secondary | ICD-10-CM | POA: Diagnosis not present

## 2015-12-06 DIAGNOSIS — I509 Heart failure, unspecified: Secondary | ICD-10-CM | POA: Diagnosis not present

## 2015-12-06 NOTE — Patient Instructions (Signed)
[name]  12/06/2015     '@PREFPERIOPPHARMACY'$ @   Your procedure is scheduled on 12/12/2015  Report to [facility] at 11:40 A.M.  Call this number if you have problems the morning of surgery:  [phone]   Remember:  Do not eat food or drink liquids after midnight.  Take these medicines the morning of surgery with A SIP OF WATER Gabapentin, Metoprolol, Protonix, Imodium if needed   Do not wear jewelry, make-up or nail polish.  Do not wear lotions, powders, or perfumes.  You may wear deoderant.  Do not shave 48 hours prior to surgery.  Men may shave face and neck.  Do not bring valuables to the hospital.  [facility] is not responsible for any belongings or valuables.  Contacts, dentures or bridgework may not be worn into surgery.  Leave your suitcase in the car.  After surgery it may be brought to your room.  For patients admitted to the hospital, discharge time will be determined by your treatment team.  Patients discharged the day of surgery will not be allowed to drive home.   Please read over the following fact sheets that you were given. Anesthesia Post-op Instructions     PATIENT INSTRUCTIONS POST-ANESTHESIA  IMMEDIATELY FOLLOWING SURGERY:  Do not drive or operate machinery for the first twenty four hours after surgery.  Do not make any important decisions for twenty four hours after surgery or while taking narcotic pain medications or sedatives.  If you develop intractable nausea and vomiting or a severe headache please notify your doctor immediately.  FOLLOW-UP:  Please make an appointment with your surgeon as instructed. You do not need to follow up with anesthesia unless specifically instructed to do so.  WOUND CARE INSTRUCTIONS (if applicable):  Keep a dry clean dressing on the anesthesia/puncture wound site if there is drainage.  Once the wound has quit draining you may leave it open to air.  Generally you should leave the bandage intact for twenty four  hours unless there is drainage.  If the epidural site drains for more than 36-48 hours please call the anesthesia department.  QUESTIONS?:  Please feel free to call your physician or the hospital operator if you have any questions, and they will be happy to assist you.      Cystoscopy Cystoscopy is a procedure that is used to help your caregiver diagnose and sometimes treat conditions that affect your lower urinary tract. Your lower urinary tract includes your bladder and the tube through which urine passes from your bladder out of your body (urethra). Cystoscopy is performed with a thin, tube-shaped instrument (cystoscope). The cystoscope has lenses and a light at the end so that your caregiver can see inside your bladder. The cystoscope is inserted at the entrance of your urethra. Your caregiver guides it through your urethra and into your bladder. There are two main types of cystoscopy:  Flexible cystoscopy (with a flexible cystoscope).  Rigid cystoscopy (with a rigid cystoscope). Cystoscopy may be recommended for many conditions, including:  Urinary tract infections.  Blood in your urine (hematuria).  Loss of bladder control (urinary incontinence) or overactive bladder.  Unusual cells found in a urine sample.  Urinary blockage.  Painful urination. Cystoscopy may also be done to remove a sample of your tissue to be checked under a microscope (biopsy). It may also be done to remove or destroy bladder stones. LET YOUR CAREGIVER KNOW ABOUT:  Allergies to food or medicine.  Medicines taken, including vitamins,  herbs, eyedrops, over-the-counter medicines, and creams.  Use of steroids (by mouth or creams).  Previous problems with anesthetics or numbing medicines.  History of bleeding problems or blood clots.  Previous surgery.  Other health problems, including diabetes and kidney problems.  Possibility of pregnancy, if this applies. PROCEDURE The area around the opening to  your urethra will be cleaned. A medicine to numb your urethra (local anesthetic) is used. If a tissue sample or stone is removed during the procedure, you may be given a medicine to make you sleep (general anesthetic). Your caregiver will gently insert the tip of the cystoscope into your urethra. The cystoscope will be slowly glided through your urethra and into your bladder. Sterile fluid will flow through the cystoscope and into your bladder. The fluid will expand and stretch your bladder. This gives your caregiver a better view of your bladder walls. The procedure lasts about 15-20 minutes. AFTER THE PROCEDURE If a local anesthetic is used, you will be allowed to go home as soon as you are ready. If a general anesthetic is used, you will be taken to a recovery area until you are stable. You may have temporary bleeding and burning on urination.   This information is not intended to replace advice given to you by your health care provider. Make sure you discuss any questions you have with your health care provider.   Document Released: 05/17/2000 Document Revised: 06/10/2014 Document Reviewed: 11/11/2011 Elsevier Interactive Patient Education [employer].

## 2015-12-07 ENCOUNTER — Encounter (HOSPITAL_COMMUNITY): Payer: Self-pay

## 2015-12-07 ENCOUNTER — Ambulatory Visit (INDEPENDENT_AMBULATORY_CARE_PROVIDER_SITE_OTHER): Payer: Medicare Other | Admitting: Cardiology

## 2015-12-07 ENCOUNTER — Encounter: Payer: Self-pay | Admitting: Cardiology

## 2015-12-07 ENCOUNTER — Encounter (HOSPITAL_COMMUNITY)
Admission: RE | Admit: 2015-12-07 | Discharge: 2015-12-07 | Disposition: A | Discharge status: Home / Self Care | Payer: Medicare Other | Source: Ambulatory Visit | Attending: Urology | Admitting: Urology

## 2015-12-07 VITALS — BP 86/50 | HR 77 | Ht 68.0 in

## 2015-12-07 DIAGNOSIS — Z01812 Encounter for preprocedural laboratory examination: Secondary | ICD-10-CM | POA: Diagnosis not present

## 2015-12-07 DIAGNOSIS — I2510 Atherosclerotic heart disease of native coronary artery without angina pectoris: Secondary | ICD-10-CM | POA: Diagnosis not present

## 2015-12-07 DIAGNOSIS — I952 Hypotension due to drugs: Secondary | ICD-10-CM

## 2015-12-07 DIAGNOSIS — N184 Chronic kidney disease, stage 4 (severe): Secondary | ICD-10-CM | POA: Diagnosis not present

## 2015-12-07 DIAGNOSIS — I10 Essential (primary) hypertension: Secondary | ICD-10-CM | POA: Diagnosis not present

## 2015-12-07 HISTORY — DX: Foot drop, right foot: M21.371

## 2015-12-07 LAB — BASIC METABOLIC PANEL
Anion gap: 12 (ref 5–15)
BUN: 47 mg/dL — ABNORMAL HIGH (ref 6–20)
CALCIUM: 8.1 mg/dL — AB (ref 8.9–10.3)
CO2: 25 mmol/L (ref 22–32)
CREATININE: 3.41 mg/dL — AB (ref 0.44–1.00)
Chloride: 100 mmol/L — ABNORMAL LOW (ref 101–111)
GFR, EST AFRICAN AMERICAN: 14 mL/min — AB (ref >60)
GFR, EST NON AFRICAN AMERICAN: 12 mL/min — AB (ref >60)
GLUCOSE: 95 mg/dL (ref 65–99)
Potassium: 4.2 mmol/L (ref 3.5–5.1)
Sodium: 137 mmol/L (ref 135–145)

## 2015-12-07 LAB — CBC
HEMATOCRIT: 33 % — AB (ref 36.0–46.0)
Hemoglobin: 10.3 g/dL — ABNORMAL LOW (ref 12.0–15.0)
MCH: 27.4 pg (ref 26.0–34.0)
MCHC: 31.2 g/dL (ref 30.0–36.0)
MCV: 87.8 fL (ref 78.0–100.0)
Platelets: 256 10*3/uL (ref 150–400)
RBC: 3.76 MIL/uL — ABNORMAL LOW (ref 3.87–5.11)
RDW: 14.8 % (ref 11.5–15.5)
WBC: 9 10*3/uL (ref 4.0–10.5)

## 2015-12-07 MED ORDER — METOPROLOL TARTRATE 25 MG PO TABS: 12.5000 mg | ORAL_TABLET | Freq: Every day | ORAL | Status: DC

## 2015-12-07 NOTE — Patient Instructions (Addendum)
Medication Instructions:  Your physician has recommended you make the following change in your medication:  1. Discontinue lasix 2. Decrease Metoprolol (12.5 mg ) nightly   Labwork: -None  Testing/Procedures: -None  Follow-Up: Your physician wants you to follow-up in: 3-4 months with Dr. Emelda Fear will receive a reminder letter in the mail two months in advance. If you don't receive a letter, please call our office to schedule the follow-up appointment.\ Your physician recommends that you keep your scheduled  follow-up appointment with Sandria Senter   Any Other Special Instructions Will Be Listed Below (If Applicable).  Call if any swelling   If you need a refill on your cardiac medications before your next appointment, please call your pharmacy.

## 2015-12-07 NOTE — Pre-Procedure Instructions (Signed)
Patient given inforamtion to sign up for my chart at home.

## 2015-12-07 NOTE — Progress Notes (Signed)
Cardiology Office Note   Date:  12/07/2015   ID:  [name], DOB 1933/07/24, MRN [id number]  PCP:  Wende Neighbors, MD  Cardiologist:  Dr. Burt Knack     Chief Complaint  Patient presents with  . Coronary Artery Disease    no chest pain      History of Present Illness: [name] is a 82 y.o. female who presents for CAD.   Last seen by Dr. Burt Knack 03/2015.  The patient has coronary artery disease and she has undergone coronary stenting in 2007 when she was treated with a bare-metal stent in the right coronary artery. She also has had difficult to control hypertension and chronic kidney disease.  The patient has been diagnosed with recurrence of non-small cell lung cancer initially diagnosed in 2007. She is now undergoing chemotherapy. She has come off of blood pressure medication with the exception of Toprol-XL because of low blood pressures. She was diagnosed with a large right paraspinous mass with osseous invasion. Recent hospitalization for persistent diarrhea.  She had acute renal failure and hypokalemia.   Lasix held but was resumed at discharge.  Her diarrhea has improved she did take imodium today.  Today she is hypotensive.  No lightheadedness.  She actually feels quite well.  No chest pain and no SOB.  She is for removal of urinary stent on 12/12/15.   Labs today stable though Cr. 3.41  Climbing back up.  Her baseline is 2.9 to 2.9.   She is to have followup CT of abd this month to monitor for cancer.  Echo done 06/2015 Compared to a prior echo in 07/2014, the EF is stable at 50-55%.  There is diastolic dysfunction with elevated LV filling pressure.  There appears to be scar of the inferior wall.   Past Medical History  Diagnosis Date  . Hypertension   . Hypercholesterolemia   . CKD (chronic kidney disease)   . Coronary artery disease     a.  LHC (06/04/05): LHC done in [place] with high grade RCA => s/p BMS to RCA;  b.  Nuclear (09/14/09): Lexiscan; Inf infarct  with mild peri-infarct ishemia, EF 52%; Low Risk.  Marland Kitchen GERD (gastroesophageal reflux disease)   . Chronic anemia   . Ischemic cardiomyopathy     a. Echo (07/26/13): Mild LVH, EF 35-40%, diff HK, inf AK, Gr 2 DD, Tr AI, mildly dilated Ao root, MAC, mild MR, mild LAE, mod reduced RVSF.  Marland Kitchen Chronic fatigue 04/04/2015  . Chronic fatigue 04/04/2015  . A-fib ([place])   . DVT (deep venous thrombosis) ([place])   . Chronic diastolic (congestive) heart failure ([place])   . Foot drop, right   . Non-small cell carcinoma of lung (HCC)     Stage IV;spinal cancer; had chemo and radiation and immune therapy    Past Surgical History  Procedure Laterality Date  . Hematoma evacuation  December 2006    groin  . Appendectomy    . Cataract extraction Bilateral   . Hemorroidectomy    . Laminectomy    . Abdominal hysterectomy    . Cholecystectomy  2011  . Ercp  2011    with stone extraction, done same day as cholecystectomy  . Portacath placement    . Tonsillectomy    . Cystoscopy      with stent placement     Current Outpatient Prescriptions  Medication Sig Dispense Refill  . acetaminophen (TYLENOL) 500 MG tablet Take 500 mg by mouth daily as needed  for mild pain.    . diphenhydrAMINE (SOMINEX) 25 MG tablet Take 25 mg by mouth as needed for allergies.    . diphenoxylate-atropine (LOMOTIL) 2.5-0.025 MG tablet Take by mouth 4 (four) times daily as needed for diarrhea or loose stools. Reported on 11/28/2015    . gabapentin (NEURONTIN) 100 MG capsule Take 100 mg by mouth 2 (two) times daily.    Marland Kitchen lidocaine-prilocaine (EMLA) cream Apply 1 application topically as needed (for pain).    Marland Kitchen loperamide (IMODIUM) 2 MG capsule Take 1 capsule (2 mg total) by mouth every 6 (six) hours as needed for diarrhea or loose stools. 30 capsule 0  . metoprolol tartrate (LOPRESSOR) 25 MG tablet Take 0.5 tablets (12.5 mg total) by mouth at bedtime. 30 tablet 0  . pantoprazole (PROTONIX) 40 MG tablet Take 40 mg by mouth at bedtime.      Marland Kitchen SANTYL ointment Apply 1 application topically at bedtime as needed (bed sores). For bed sores     No current facility-administered medications for this visit.    Allergies:   Amlodipine; Ciprofloxacin; Mirtazapine; Statins; Aspirin; Cephalexin; Hydralazine; Iron; Ambien; Lorazepam; Sulfonamide derivatives; Zolpidem; Penicillins; Shellfish allergy; and Tape    Social History:  The patient  reports that she quit smoking about 25 years ago. Her smoking use included Cigarettes. She has a 35 pack-year smoking history. She has never used smokeless tobacco. She reports that she does not drink alcohol or use illicit drugs.   Family History:  The patient's family history includes Heart failure in her mother; Lung cancer in her brother and sister; Stomach cancer in her brother. There is no history of Heart attack, Stroke, or Colon cancer.    ROS:  General:no colds or fevers, no weight changes Skin:no rashes or ulcers HEENT:no blurred vision, no congestion CV:see HPI PUL:see HPI GI:no diarrhea constipation or melena, no indigestion GU:no hematuria, no dysuria MS:no joint pain, no claudication Neuro:no syncope, no lightheadedness Endo:no diabetes, no thyroid disease  Wt Readings from Last 3 Encounters:  12/07/15 130 lb (58.968 kg)  11/28/15 130 lb 11.7 oz (59.3 kg)  07/27/15 134 lb 4.2 oz (60.9 kg)     PHYSICAL EXAM: VS:  BP 86/50 mmHg  Pulse 77  Ht '5\' 8"'$  (1.727 m)  Wt   SpO2 97% , BMI There is no weight on file to calculate BMI. General:Pleasant affect, NAD Skin:Warm and dry, brisk capillary refill HEENT:normocephalic, sclera clear, mucus membranes moist Neck:supple, no JVD, no bruits  Heart:S1S2 RRR without murmur, gallup, rub or click Lungs:clear without rales, rhonchi, or wheezes ZYS:AYTK, non tender, + BS, do not palpate liver spleen or masses Ext:no lower ext edema,  2+ radial pulses, chronic rt foot drop Neuro:alert and oriented, MAE, follows commands, + facial  symmetry    EKG:  EKG is  NOT ordered today. Reviewed EKG from 07/2015 SR no acute changes.   Recent Labs: 07/25/2015: B Natriuretic Peptide 1017.4* 11/28/2015: ALT 8*; Magnesium 1.5*; TSH 3.508 12/07/2015: BUN 47*; Creatinine, Ser 3.41*; Hemoglobin 10.3*; Platelets 256; Potassium 4.2; Sodium 137    Lipid Panel    Component Value Date/Time   CHOL 248* 07/29/2014 0650   TRIG 179* 07/29/2014 0650   HDL 32* 07/29/2014 0650   CHOLHDL 7.8 07/29/2014 0650   VLDL 36 07/29/2014 0650   LDLCALC 180* 07/29/2014 0650   LDLDIRECT 209.4 05/19/2008 1024       Other studies Reviewed: Additional studies/ records that were reviewed today include: ECHO 06/2015  Study Conclusions  - Left  ventricle: The cavity size was normal. Wall thickness was  normal. Systolic function was normal. The estimated ejection  fraction was in the range of 50% to 55%. There is enhanced  echogenicity, thinning and akinesis of the inferior wall  consistent with prior infarct. Doppler parameters are consistent  with abnormal left ventricular relaxation (grade 1 diastolic  dysfunction). The E/e&' ratio is >15, suggesting elevated LV  filling pressure. - Aortic valve: Trileaflet; mildly calcified leaflets.  Transvalvular velocity was minimally increased. There was no  stenosis. There was moderate regurgitation. Valve area (VTI):  3.26 cm^2. Valve area (Vmax): 2.39 cm^2. Valve area (Vmean): 2.4  cm^2. - Mitral valve: Mildly thickened leaflets . There was moderate  regurgitation. - Left atrium: The atrium was at the upper limits of normal in  size. - Inferior vena cava: The vessel was normal in size. The  respirophasic diameter changes were in the normal range (= 50%),  consistent with normal central venous pressure. - Pericardium, extracardiac: There was a left pleural effusion.  Impressions:  - Compared to a prior echo in 07/2014, the EF is stable at 50-55%.  There is diastolic dysfunction with  elevated LV filling pressure.  There appears to be scar of the inferior wall.  ASSESSMENT AND PLAN:   1. CAD, native vessel, without angina 2. Essential hypertension with CKD currently hypotensive.  stopping lasix and decreasing BB to 12.5 once daily. 3. Chronic kidney disease stage IV with increase cr.  Will stop lasix she will call if SOB or edema. 4. Non-small cell lung cancer, metastatic\ She will need   The patient's primary issue now relates to her lung cancer. In Oct. Dr. Burt Knack wanted her to come off the BB but she has continued. Will decrease as above.  She will follow up in 1-2 weeks with APP to recheck BP and with Dr. Burt Knack in 3 months.      Current medicines are reviewed with the patient today.  The patient Has no concerns regarding medicines.  The following changes have been made:  See above Labs/ tests ordered today include:see above  Disposition:   FU:  see above  Signed, Cecilie Kicks, NP  12/07/2015 11:54 PM    [facility] [address] [place] [place], [place] Phone: [phone]; Fax: [phone]

## 2015-12-08 ENCOUNTER — Other Ambulatory Visit (HOSPITAL_COMMUNITY): Payer: Medicare Other

## 2015-12-08 DIAGNOSIS — D638 Anemia in other chronic diseases classified elsewhere: Secondary | ICD-10-CM | POA: Diagnosis not present

## 2015-12-08 DIAGNOSIS — C3490 Malignant neoplasm of unspecified part of unspecified bronchus or lung: Secondary | ICD-10-CM | POA: Diagnosis not present

## 2015-12-08 DIAGNOSIS — I5032 Chronic diastolic (congestive) heart failure: Secondary | ICD-10-CM | POA: Diagnosis not present

## 2015-12-08 DIAGNOSIS — L89154 Pressure ulcer of sacral region, stage 4: Secondary | ICD-10-CM | POA: Diagnosis not present

## 2015-12-08 DIAGNOSIS — I4891 Unspecified atrial fibrillation: Secondary | ICD-10-CM | POA: Diagnosis not present

## 2015-12-08 DIAGNOSIS — G8311 Monoplegia of lower limb affecting right dominant side: Secondary | ICD-10-CM | POA: Diagnosis not present

## 2015-12-08 DIAGNOSIS — I130 Hypertensive heart and chronic kidney disease with heart failure and stage 1 through stage 4 chronic kidney disease, or unspecified chronic kidney disease: Secondary | ICD-10-CM | POA: Diagnosis not present

## 2015-12-08 DIAGNOSIS — C7951 Secondary malignant neoplasm of bone: Secondary | ICD-10-CM | POA: Diagnosis not present

## 2015-12-08 DIAGNOSIS — I2510 Atherosclerotic heart disease of native coronary artery without angina pectoris: Secondary | ICD-10-CM | POA: Diagnosis not present

## 2015-12-08 DIAGNOSIS — E440 Moderate protein-calorie malnutrition: Secondary | ICD-10-CM | POA: Diagnosis not present

## 2015-12-08 DIAGNOSIS — I82401 Acute embolism and thrombosis of unspecified deep veins of right lower extremity: Secondary | ICD-10-CM | POA: Diagnosis not present

## 2015-12-08 DIAGNOSIS — N183 Chronic kidney disease, stage 3 (moderate): Secondary | ICD-10-CM | POA: Diagnosis not present

## 2015-12-11 DIAGNOSIS — Z86718 Personal history of other venous thrombosis and embolism: Secondary | ICD-10-CM | POA: Diagnosis not present

## 2015-12-11 DIAGNOSIS — Z466 Encounter for fitting and adjustment of urinary device: Secondary | ICD-10-CM | POA: Diagnosis not present

## 2015-12-11 DIAGNOSIS — Z87440 Personal history of urinary (tract) infections: Secondary | ICD-10-CM | POA: Diagnosis not present

## 2015-12-11 DIAGNOSIS — I2510 Atherosclerotic heart disease of native coronary artery without angina pectoris: Secondary | ICD-10-CM | POA: Diagnosis not present

## 2015-12-11 DIAGNOSIS — N183 Chronic kidney disease, stage 3 (moderate): Secondary | ICD-10-CM | POA: Diagnosis not present

## 2015-12-11 DIAGNOSIS — L89154 Pressure ulcer of sacral region, stage 4: Secondary | ICD-10-CM | POA: Diagnosis not present

## 2015-12-11 DIAGNOSIS — C7951 Secondary malignant neoplasm of bone: Secondary | ICD-10-CM | POA: Diagnosis not present

## 2015-12-11 DIAGNOSIS — G8311 Monoplegia of lower limb affecting right dominant side: Secondary | ICD-10-CM | POA: Diagnosis not present

## 2015-12-11 DIAGNOSIS — C3490 Malignant neoplasm of unspecified part of unspecified bronchus or lung: Secondary | ICD-10-CM | POA: Diagnosis not present

## 2015-12-11 DIAGNOSIS — I130 Hypertensive heart and chronic kidney disease with heart failure and stage 1 through stage 4 chronic kidney disease, or unspecified chronic kidney disease: Secondary | ICD-10-CM | POA: Diagnosis not present

## 2015-12-11 DIAGNOSIS — I5032 Chronic diastolic (congestive) heart failure: Secondary | ICD-10-CM | POA: Diagnosis not present

## 2015-12-12 ENCOUNTER — Ambulatory Visit (HOSPITAL_COMMUNITY): Payer: Medicare Other | Admitting: Anesthesiology

## 2015-12-12 ENCOUNTER — Ambulatory Visit (HOSPITAL_COMMUNITY)
Admission: RE | Admit: 2015-12-12 | Discharge: 2015-12-12 | Disposition: A | Discharge status: Home / Self Care | Payer: Medicare Other | Source: Ambulatory Visit | Attending: Urology | Admitting: Urology

## 2015-12-12 ENCOUNTER — Telehealth: Payer: Self-pay | Admitting: Urology

## 2015-12-12 ENCOUNTER — Ambulatory Visit (HOSPITAL_COMMUNITY): Payer: Medicare Other

## 2015-12-12 ENCOUNTER — Encounter (HOSPITAL_COMMUNITY)
Admission: RE | Disposition: A | Discharge status: Home / Self Care | Payer: Self-pay | Source: Ambulatory Visit | Attending: Urology

## 2015-12-12 ENCOUNTER — Encounter (HOSPITAL_COMMUNITY): Payer: Self-pay | Admitting: *Deleted

## 2015-12-12 DIAGNOSIS — Z466 Encounter for fitting and adjustment of urinary device: Secondary | ICD-10-CM | POA: Diagnosis not present

## 2015-12-12 DIAGNOSIS — I255 Ischemic cardiomyopathy: Secondary | ICD-10-CM | POA: Diagnosis not present

## 2015-12-12 DIAGNOSIS — D649 Anemia, unspecified: Secondary | ICD-10-CM | POA: Insufficient documentation

## 2015-12-12 DIAGNOSIS — Z9842 Cataract extraction status, left eye: Secondary | ICD-10-CM | POA: Diagnosis not present

## 2015-12-12 DIAGNOSIS — E7800 Pure hypercholesterolemia, unspecified: Secondary | ICD-10-CM | POA: Insufficient documentation

## 2015-12-12 DIAGNOSIS — N1339 Other hydronephrosis: Secondary | ICD-10-CM | POA: Diagnosis not present

## 2015-12-12 DIAGNOSIS — Z86718 Personal history of other venous thrombosis and embolism: Secondary | ICD-10-CM | POA: Diagnosis not present

## 2015-12-12 DIAGNOSIS — Z9841 Cataract extraction status, right eye: Secondary | ICD-10-CM | POA: Diagnosis not present

## 2015-12-12 DIAGNOSIS — I2510 Atherosclerotic heart disease of native coronary artery without angina pectoris: Secondary | ICD-10-CM | POA: Insufficient documentation

## 2015-12-12 DIAGNOSIS — I130 Hypertensive heart and chronic kidney disease with heart failure and stage 1 through stage 4 chronic kidney disease, or unspecified chronic kidney disease: Secondary | ICD-10-CM | POA: Insufficient documentation

## 2015-12-12 DIAGNOSIS — N1330 Unspecified hydronephrosis: Secondary | ICD-10-CM

## 2015-12-12 DIAGNOSIS — R5382 Chronic fatigue, unspecified: Secondary | ICD-10-CM | POA: Insufficient documentation

## 2015-12-12 DIAGNOSIS — I5032 Chronic diastolic (congestive) heart failure: Secondary | ICD-10-CM | POA: Diagnosis not present

## 2015-12-12 DIAGNOSIS — M21371 Foot drop, right foot: Secondary | ICD-10-CM | POA: Insufficient documentation

## 2015-12-12 DIAGNOSIS — Z85118 Personal history of other malignant neoplasm of bronchus and lung: Secondary | ICD-10-CM | POA: Insufficient documentation

## 2015-12-12 DIAGNOSIS — K219 Gastro-esophageal reflux disease without esophagitis: Secondary | ICD-10-CM | POA: Diagnosis not present

## 2015-12-12 DIAGNOSIS — I4891 Unspecified atrial fibrillation: Secondary | ICD-10-CM | POA: Insufficient documentation

## 2015-12-12 DIAGNOSIS — N189 Chronic kidney disease, unspecified: Secondary | ICD-10-CM | POA: Insufficient documentation

## 2015-12-12 HISTORY — PX: CYSTOSCOPY W/ URETERAL STENT PLACEMENT: SHX1429

## 2015-12-12 SURGERY — CYSTOSCOPY, WITH RETROGRADE PYELOGRAM AND URETERAL STENT INSERTION
Anesthesia: Monitor Anesthesia Care | Site: Ureter | Laterality: Right

## 2015-12-12 MED ORDER — ONDANSETRON HCL 4 MG/2ML IJ SOLN: 4.0000 mg | Freq: Once | INTRAMUSCULAR | Status: DC | PRN

## 2015-12-12 MED ORDER — PROPOFOL 500 MG/50ML IV EMUL
INTRAVENOUS | Status: DC | PRN
  Administered 2015-12-12: 100 ug/kg/min via INTRAVENOUS

## 2015-12-12 MED ORDER — LIDOCAINE HCL (CARDIAC) 20 MG/ML IV SOLN
INTRAVENOUS | Status: DC | PRN
  Administered 2015-12-12: 50 mg via INTRATRACHEAL

## 2015-12-12 MED ORDER — GENTAMICIN IN SALINE 1.6-0.9 MG/ML-% IV SOLN
INTRAVENOUS | Status: AC
  Filled 2015-12-12: qty 50

## 2015-12-12 MED ORDER — CHLORHEXIDINE GLUCONATE CLOTH 2 % EX PADS: 6.0000 | MEDICATED_PAD | Freq: Once | CUTANEOUS | Status: DC

## 2015-12-12 MED ORDER — STERILE WATER FOR IRRIGATION IR SOLN
Status: DC | PRN
  Administered 2015-12-12: 1000 mL

## 2015-12-12 MED ORDER — MIDAZOLAM HCL 2 MG/2ML IJ SOLN
1.0000 mg | INTRAMUSCULAR | Status: DC | PRN
  Administered 2015-12-12: 2 mg via INTRAVENOUS

## 2015-12-12 MED ORDER — MIDAZOLAM HCL 2 MG/2ML IJ SOLN
INTRAMUSCULAR | Status: AC
  Filled 2015-12-12: qty 2

## 2015-12-12 MED ORDER — FENTANYL CITRATE (PF) 100 MCG/2ML IJ SOLN: 25.0000 ug | INTRAMUSCULAR | Status: DC | PRN

## 2015-12-12 MED ORDER — DIATRIZOATE MEGLUMINE 30 % UR SOLN
URETHRAL | Status: AC
  Filled 2015-12-12: qty 300

## 2015-12-12 MED ORDER — SODIUM CHLORIDE 0.9 % IR SOLN
Status: DC | PRN
  Administered 2015-12-12: 3000 mL

## 2015-12-12 MED ORDER — PROPOFOL 10 MG/ML IV BOLUS
INTRAVENOUS | Status: AC
  Filled 2015-12-12: qty 40

## 2015-12-12 MED ORDER — FENTANYL CITRATE (PF) 100 MCG/2ML IJ SOLN
INTRAMUSCULAR | Status: AC
  Filled 2015-12-12: qty 2

## 2015-12-12 MED ORDER — DIATRIZOATE MEGLUMINE 30 % UR SOLN
URETHRAL | Status: DC | PRN
  Administered 2015-12-12: 9 mL via URETHRAL

## 2015-12-12 MED ORDER — FENTANYL CITRATE (PF) 100 MCG/2ML IJ SOLN
25.0000 ug | INTRAMUSCULAR | Status: AC | PRN
  Administered 2015-12-12 (×2): 25 ug via INTRAVENOUS

## 2015-12-12 MED ORDER — GENTAMICIN SULFATE 40 MG/ML IJ SOLN
160.0000 mg | Freq: Once | INTRAVENOUS | Status: AC
  Administered 2015-12-12: 160 mg via INTRAVENOUS
  Filled 2015-12-12: qty 4

## 2015-12-12 MED ORDER — LIDOCAINE HCL (PF) 1 % IJ SOLN
INTRAMUSCULAR | Status: AC
  Filled 2015-12-12: qty 5

## 2015-12-12 MED ORDER — LACTATED RINGERS IV SOLN
INTRAVENOUS | Status: DC
  Administered 2015-12-12: 1000 mL via INTRAVENOUS
  Administered 2015-12-12: 12:00:00 via INTRAVENOUS

## 2015-12-12 SURGICAL SUPPLY — 23 items
BAG DRAIN URO TABLE W/ADPT NS (DRAPE) ×3 IMPLANT
BAG DRN 8 ADPR NS SKTRN CSTL (DRAPE) ×1
BAG HAMPER (MISCELLANEOUS) ×3 IMPLANT
CATH INTERMIT  6FR 70CM (CATHETERS) ×2 IMPLANT
CLOTH BEACON ORANGE TIMEOUT ST (SAFETY) ×3 IMPLANT
GLOVE BIO SURGEON STRL SZ7 (GLOVE) ×2 IMPLANT
GLOVE BIOGEL M 8.0 STRL (GLOVE) ×3 IMPLANT
GLOVE BIOGEL PI IND STRL 7.0 (GLOVE) IMPLANT
GLOVE BIOGEL PI INDICATOR 7.0 (GLOVE) ×4
GLOVE EXAM NITRILE MD LF STRL (GLOVE) ×2 IMPLANT
GOWN STRL REIN XL XLG (GOWN DISPOSABLE) ×3 IMPLANT
GOWN STRL REUS W/TWL XL LVL3 (GOWN DISPOSABLE) ×4 IMPLANT
GUIDEWIRE STR DUAL SENSOR (WIRE) ×3 IMPLANT
IV NS IRRIG 3000ML ARTHROMATIC (IV SOLUTION) ×3 IMPLANT
KIT ROOM TURNOVER AP CYSTO (KITS) ×3 IMPLANT
MANIFOLD NEPTUNE II (INSTRUMENTS) ×3 IMPLANT
PACK CYSTO (CUSTOM PROCEDURE TRAY) ×3 IMPLANT
PAD ARMBOARD 7.5X6 YLW CONV (MISCELLANEOUS) ×3 IMPLANT
STENT CONTOUR 7FRX24 (STENTS) ×2 IMPLANT
TOWEL OR 17X26 4PK STRL BLUE (TOWEL DISPOSABLE) ×3 IMPLANT
TRAY FOLEY CATH 16FR SILVER (SET/KITS/TRAYS/PACK) ×2 IMPLANT
WATER STERILE IRR 1000ML POUR (IV SOLUTION) ×3 IMPLANT
WATER STERILE IRR 3000ML UROMA (IV SOLUTION) ×4 IMPLANT

## 2015-12-12 NOTE — Progress Notes (Signed)
Dressing dry and intact to sacral area.

## 2015-12-12 NOTE — Discharge Instructions (Signed)

## 2015-12-12 NOTE — Anesthesia Preprocedure Evaluation (Signed)
Anesthesia Evaluation  Patient identified by MRN, date of birth, ID band Patient awake    Reviewed: Allergy & Precautions, NPO status , Patient's Chart, lab work & pertinent test results, reviewed documented beta blocker date and time   Airway Mallampati: I  TM Distance: >3 FB     Dental  (+) Edentulous Upper, Edentulous Lower   Pulmonary shortness of breath, former smoker,  Lung CA with bone mets - paraspinal mass   breath sounds clear to auscultation       Cardiovascular hypertension, Pt. on medications and Pt. on home beta blockers + CAD, + Cardiac Stents and + DVT  + dysrhythmias Atrial Fibrillation  Rhythm:Regular Rate:Normal     Neuro/Psych    GI/Hepatic GERD  Medicated,  Endo/Other    Renal/GU Renal disease     Musculoskeletal   Abdominal   Peds  Hematology  (+) anemia ,   Anesthesia Other Findings   Reproductive/Obstetrics                             Anesthesia Physical Anesthesia Plan  ASA: III  Anesthesia Plan: MAC   Post-op Pain Management:    Induction: Intravenous  Airway Management Planned: Simple Face Mask  Additional Equipment:   Intra-op Plan:   Post-operative Plan:   Informed Consent: I have reviewed the patients History and Physical, chart, labs and discussed the procedure including the risks, benefits and alternatives for the proposed anesthesia with the patient or authorized representative who has indicated his/her understanding and acceptance.     Plan Discussed with:   Anesthesia Plan Comments:         Anesthesia Quick Evaluation

## 2015-12-12 NOTE — Telephone Encounter (Signed)
Pt advised of pre op date/time and sx date. Pre op: 12/08/15 @ 10:00am-[place].  Sx: 12/12/15 with Dr Diona Fanti @ [facility]. Post op: 02/13/16 @ 2:45p.   No authorization is required for CPT: [id number] per Rosana Hoes with [facility] @ [phone]/12/15 @ 10:32am CT.

## 2015-12-12 NOTE — Op Note (Signed)
PATIENT:  [name]  PRE-OPERATIVE DIAGNOSIS: Right-sided malignant hydronephrosis  POST-OPERATIVE DIAGNOSIS: Same  PROCEDURE: Cystoscopy, right double-J stent extraction, right retrograde ureteropyelogram, placement of right double-J stent-7 [place] by 24 cm contour, without tether, interpretive fluoroscopy  SURGEON:  Lillette Boxer. Nadalyn Deringer, M.D.  ANESTHESIA:  IV sedation  EBL:  None  DRAINS: 16 French Foley catheter, 24 cm x   7 French contour double-J stent LOCAL MEDICATIONS USED:  None  SPECIMEN:  None     INDICATION: [name] is an  82 year old female presenting for double-J stent exchange for management of malignant right hydronephrosis. She has had an indwelling stent for some time as primary therapy for her hydronephrosis.  Description of procedure: The patient was properly identified and marked (if applicable) in the holding area. They were then  taken to the operating room and placed on the table in a supine position. General anesthesia was then administered. Once fully anesthetized the patient was moved to the dorsolithotomy position and the genitalia and perineum were sterilely prepped and draped in standard fashion. An official timeout was then performed.  Cystoscope was advanced in her bladder. Inspection revealed her right ureteral stent present, no other abnormalities. The stent was grasped and extracted. A 6 French open-ended catheter was then advanced. Retrograde ureteropyelogram was performed using Cystografin. This revealed a normal distal ureter. There was significant medial deviation of her right ureter, just about to the midline. There was narrowing of the ureter, but contrast did pass through this area. The upper ureter and pyelocalyceal system was normal.  Because of the significant deviation, and her retroperitoneal mass, I felt it worthwhile to leave the stent in place. A guidewire, 0.038 inch sensor-tip wire, was advanced through the open-ended  catheter and using fluoroscopic guidance was advanced to the upper pole calyceal system on the right. The open-ended catheter was removed, and using the Seldinger technique, the double-J stent was adequately positioned with the proximal end in the right renal pelvis and the distal end in the bladder. At this point, the bladder was drained and a 16 French Foley catheter with leg bag was placed.  The patient tolerated procedure well. She was awakened and taken to the PACU in stable condition.      PLAN OF CARE: Discharge to home after PACU  PATIENT DISPOSITION:  PACU - hemodynamically stable.

## 2015-12-12 NOTE — Transfer of Care (Signed)
Immediate Anesthesia Transfer of Care Note  Patient: [name]  Procedure(s) Performed: Procedure(s): CYSTOSCOPY WITH RETROGRADE PYELOGRAM WITH STENT EXCHANGE (Right)  Patient Location: PACU  Anesthesia Type:MAC  Level of Consciousness: awake, alert , oriented and patient cooperative  Airway & Oxygen Therapy: Patient Spontanous Breathing and Patient connected to nasal cannula oxygen  Post-op Assessment: Report given to RN and Post -op Vital signs reviewed and stable  Post vital signs: Reviewed and stable  Last Vitals:  Filed Vitals:   12/12/15 1230 12/12/15 1235  BP: 97/59   Pulse:    Temp:    Resp: 19 15    Last Pain:  Filed Vitals:   12/12/15 1236  PainSc: 0-No pain      Patients Stated Pain Goal: 5 (51/88/41 6606)  Complications: No apparent anesthesia complications

## 2015-12-12 NOTE — Anesthesia Postprocedure Evaluation (Signed)
Anesthesia Post Note  Patient: [name]  Procedure(s) Performed: Procedure(s) (LRB): CYSTOSCOPY WITH RETROGRADE PYELOGRAM WITH STENT EXCHANGE (Right)  Patient location during evaluation: PACU Anesthesia Type: MAC Level of consciousness: awake and alert, oriented and patient cooperative Pain management: pain level controlled Vital Signs Assessment: post-procedure vital signs reviewed and stable Respiratory status: spontaneous breathing Cardiovascular status: blood pressure returned to baseline and stable Postop Assessment: no headache, no backache, no signs of nausea or vomiting and adequate PO intake Anesthetic complications: no    Last Vitals:  Filed Vitals:   12/12/15 1230 12/12/15 1235  BP: 97/59   Pulse:    Temp:    Resp: 19 15    Last Pain:  Filed Vitals:   12/12/15 1236  PainSc: 0-No pain                 Javis Abboud

## 2015-12-12 NOTE — H&P (Signed)
H&P  Chief Complaint: locked right kidney  History of Present Illness: [name] is a 82 y.o. year old female, presenting at this time fdouble-J stent exchange.  She has malignant hydronephrosis,was initially treated with percutaneous nephrostomy tube, then ith internalization of a double-J   Past Medical History  Diagnosis Date  . Hypertension   . Hypercholesterolemia   . CKD (chronic kidney disease)   . Coronary artery disease     a.  LHC (06/04/05): LHC done in [place] with high grade RCA => s/p BMS to RCA;  b.  Nuclear (09/14/09): Lexiscan; Inf infarct with mild peri-infarct ishemia, EF 52%; Low Risk.  Marland Kitchen GERD (gastroesophageal reflux disease)   . Chronic anemia   . Ischemic cardiomyopathy     a. Echo (07/26/13): Mild LVH, EF 35-40%, diff HK, inf AK, Gr 2 DD, Tr AI, mildly dilated Ao root, MAC, mild MR, mild LAE, mod reduced RVSF.  Marland Kitchen Chronic fatigue 04/04/2015  . Chronic fatigue 04/04/2015  . A-fib ([place])   . DVT (deep venous thrombosis) ([place])   . Chronic diastolic (congestive) heart failure ([place])   . Foot drop, right   . Non-small cell carcinoma of lung (HCC)     Stage IV;spinal cancer; had chemo and radiation and immune therapy    Past Surgical History  Procedure Laterality Date  . Hematoma evacuation  December 2006    groin  . Appendectomy    . Cataract extraction Bilateral   . Hemorroidectomy    . Laminectomy    . Abdominal hysterectomy    . Cholecystectomy  2011  . Ercp  2011    with stone extraction, done same day as cholecystectomy  . Portacath placement    . Tonsillectomy    . Cystoscopy      with stent placement    Home Medications:  Medications Prior to Admission  Medication Sig Dispense Refill  . acetaminophen (TYLENOL) 500 MG tablet Take 500 mg by mouth daily as needed for mild pain.    . diphenoxylate-atropine (LOMOTIL) 2.5-0.025 MG tablet Take by mouth 4 (four) times daily as needed for diarrhea or loose stools. Reported on 11/28/2015    .  loperamide (IMODIUM) 2 MG capsule Take 1 capsule (2 mg total) by mouth every 6 (six) hours as needed for diarrhea or loose stools. 30 capsule 0  . metoprolol tartrate (LOPRESSOR) 25 MG tablet Take 0.5 tablets (12.5 mg total) by mouth at bedtime. 30 tablet 0  . pantoprazole (PROTONIX) 40 MG tablet Take 40 mg by mouth at bedtime.    . diphenhydrAMINE (SOMINEX) 25 MG tablet Take 25 mg by mouth as needed for allergies.    Marland Kitchen gabapentin (NEURONTIN) 100 MG capsule Take 100 mg by mouth 2 (two) times daily.    Marland Kitchen lidocaine-prilocaine (EMLA) cream Apply 1 application topically as needed (for pain).    Annitta Needs ointment Apply 1 application topically at bedtime as needed (bed sores). For bed sores      Allergies:  Allergies  Allergen Reactions  . Amlodipine Swelling  . Ciprofloxacin Hives and Other (See Comments)    REACTION: weakness  . Mirtazapine Other (See Comments)    Hallucinations and nightmares, verbally aggressive   . Statins Other (See Comments)  . Aspirin Swelling    Mouth swelling, tongue Patient reports that she tolerates ibuprofen without problem  . Cephalexin Hives  . Hydralazine Nausea And Vomiting  . Iron Nausea And Vomiting  . Ambien [Zolpidem Tartrate] Other (See Comments)  confusion  . Lorazepam Other (See Comments)    Hallucinations, verbally aggressive  . Sulfonamide Derivatives Hives  . Zolpidem     Confusion  . Penicillins Other (See Comments)    Has patient had a PCN reaction causing immediate rash, facial/tongue/throat swelling, SOB or lightheadedness with hypotension: Yes Has patient had a PCN reaction causing severe rash involving mucus membranes or skin necrosis: Yes Has patient had a PCN reaction that required hospitalization No Has patient had a PCN reaction occurring within the last 10 years: No If all of the above answers are "NO", then may proceed with Cephalosporin use.   . Shellfish Allergy Hives and Rash  . Tape Rash    Family History  Problem  Relation Age of Onset  . Heart failure Mother   . Lung cancer Sister   . Lung cancer Brother   . Stomach cancer Brother   . Heart attack Neg Hx   . Stroke Neg Hx   . Colon cancer Neg Hx     Social History:  reports that she quit smoking about 25 years ago. Her smoking use included Cigarettes. She has a 35 pack-year smoking history. She has never used smokeless tobacco. She reports that she does not drink alcohol or use illicit drugs.  ROS: A complete review of systems was performed.  All systems are negative except for pertinent findings as noted.  Physical Exam:  Vital signs in last 24 hours: Temp:  [98.1 F (36.7 C)] 98.1 F (36.7 C) (07/11 1155) Pulse Rate:  [90] 90 (07/11 1155) Resp:  [15-21] 17 (07/11 1220) BP: (100-128)/(57-72) 101/57 mmHg (07/11 1220) SpO2:  [94 %-100 %] 99 % (07/11 1220) Weight:  [58.968 kg (130 lb)] 58.968 kg (130 lb) (07/11 1155) General:  Alert and oriented, No acute distress HEENT: Normocephalic, atraumatic Neck: No JVD or lymphadenopathy Cardiovascular: Regular rate and rhythm Lungs: Clear bilaterally Abdomen: Soft, nontender, nondistended, no abdominal masses Back: No CVA tenderness Extremities: No edema Neurologic: Grossly intact  Laboratory Data:  No results found for this or any previous visit (from the past 24 hour(s)). No results found for this or any previous visit (from the past 240 hour(s)). Creatinine:  Recent Labs  12/07/15 0853  CREATININE 3.41*    Radiologic Imaging: No results found.  Impression/Assessment:  Right sided malignant hydronephrosi  Plan:  Cystoscopy, double-J stent exchang  Jorja Loa 12/12/2015, 12:23 PM  Lillette Boxer. Tamella Tuccillo MD

## 2015-12-13 ENCOUNTER — Ambulatory Visit (HOSPITAL_BASED_OUTPATIENT_CLINIC_OR_DEPARTMENT_OTHER): Payer: Medicare Other

## 2015-12-13 ENCOUNTER — Telehealth: Payer: Self-pay | Admitting: Medical Oncology

## 2015-12-13 ENCOUNTER — Other Ambulatory Visit (HOSPITAL_BASED_OUTPATIENT_CLINIC_OR_DEPARTMENT_OTHER): Payer: Medicare Other

## 2015-12-13 ENCOUNTER — Ambulatory Visit (HOSPITAL_COMMUNITY)
Admission: RE | Admit: 2015-12-13 | Discharge: 2015-12-13 | Disposition: A | Discharge status: Home / Self Care | Payer: Medicare Other | Source: Ambulatory Visit | Attending: Internal Medicine | Admitting: Internal Medicine

## 2015-12-13 VITALS — BP 128/57 | HR 90 | Temp 97.5°F | Resp 16

## 2015-12-13 DIAGNOSIS — R918 Other nonspecific abnormal finding of lung field: Secondary | ICD-10-CM | POA: Insufficient documentation

## 2015-12-13 DIAGNOSIS — E440 Moderate protein-calorie malnutrition: Secondary | ICD-10-CM | POA: Insufficient documentation

## 2015-12-13 DIAGNOSIS — M899 Disorder of bone, unspecified: Secondary | ICD-10-CM | POA: Diagnosis not present

## 2015-12-13 DIAGNOSIS — C7951 Secondary malignant neoplasm of bone: Secondary | ICD-10-CM

## 2015-12-13 DIAGNOSIS — C3490 Malignant neoplasm of unspecified part of unspecified bronchus or lung: Secondary | ICD-10-CM | POA: Diagnosis not present

## 2015-12-13 DIAGNOSIS — D638 Anemia in other chronic diseases classified elsewhere: Secondary | ICD-10-CM | POA: Diagnosis not present

## 2015-12-13 DIAGNOSIS — Z95828 Presence of other vascular implants and grafts: Secondary | ICD-10-CM

## 2015-12-13 LAB — COMPREHENSIVE METABOLIC PANEL
ALBUMIN: 2.3 g/dL — AB (ref 3.5–5.0)
AST: 13 U/L (ref 5–34)
Alkaline Phosphatase: 121 U/L (ref 40–150)
Anion Gap: 16 mEq/L — ABNORMAL HIGH (ref 3–11)
BUN: 48.6 mg/dL — AB (ref 7.0–26.0)
CALCIUM: 8.3 mg/dL — AB (ref 8.4–10.4)
CHLORIDE: 100 meq/L (ref 98–109)
CO2: 22 mEq/L (ref 22–29)
CREATININE: 3.6 mg/dL — AB (ref 0.6–1.1)
EGFR: 11 mL/min/{1.73_m2} — ABNORMAL LOW (ref >90)
GLUCOSE: 102 mg/dL (ref 70–140)
Potassium: 3.6 mEq/L (ref 3.5–5.1)
SODIUM: 138 meq/L (ref 136–145)
Total Bilirubin: 0.33 mg/dL (ref 0.20–1.20)
Total Protein: 6.3 g/dL — ABNORMAL LOW (ref 6.4–8.3)

## 2015-12-13 LAB — CBC WITH DIFFERENTIAL/PLATELET
BASO%: 0.3 % (ref 0.0–2.0)
Basophils Absolute: 0 10*3/uL (ref 0.0–0.1)
EOS%: 0.8 % (ref 0.0–7.0)
Eosinophils Absolute: 0.1 10*3/uL (ref 0.0–0.5)
HEMATOCRIT: 31.6 % — AB (ref 34.8–46.6)
HEMOGLOBIN: 10.3 g/dL — AB (ref 11.6–15.9)
LYMPH#: 0.6 10*3/uL — AB (ref 0.9–3.3)
LYMPH%: 7.4 % — ABNORMAL LOW (ref 14.0–49.7)
MCH: 27.6 pg (ref 25.1–34.0)
MCHC: 32.5 g/dL (ref 31.5–36.0)
MCV: 85 fL (ref 79.5–101.0)
MONO#: 0.9 10*3/uL (ref 0.1–0.9)
MONO%: 11 % (ref 0.0–14.0)
NEUT#: 6.3 10*3/uL (ref 1.5–6.5)
NEUT%: 80.5 % — ABNORMAL HIGH (ref 38.4–76.8)
Platelets: 293 10*3/uL (ref 145–400)
RBC: 3.72 10*6/uL (ref 3.70–5.45)
RDW: 15.2 % — AB (ref 11.2–14.5)
WBC: 7.8 10*3/uL (ref 3.9–10.3)

## 2015-12-13 MED ORDER — HEPARIN SOD (PORK) LOCK FLUSH 100 UNIT/ML IV SOLN
500.0000 [IU] | Freq: Once | INTRAVENOUS | Status: AC
  Administered 2015-12-13: 500 [IU] via INTRAVENOUS
  Filled 2015-12-13: qty 5

## 2015-12-13 MED ORDER — SODIUM CHLORIDE 0.9% FLUSH
10.0000 mL | INTRAVENOUS | Status: DC | PRN
Start: 2015-12-13 — End: 2015-12-13
  Administered 2015-12-13: 10 mL via INTRAVENOUS
  Filled 2015-12-13: qty 10

## 2015-12-13 NOTE — Telephone Encounter (Signed)
faxed Cmet to Dr Diona Fanti

## 2015-12-13 NOTE — Patient Instructions (Signed)

## 2015-12-14 DIAGNOSIS — I130 Hypertensive heart and chronic kidney disease with heart failure and stage 1 through stage 4 chronic kidney disease, or unspecified chronic kidney disease: Secondary | ICD-10-CM | POA: Diagnosis not present

## 2015-12-14 DIAGNOSIS — Z466 Encounter for fitting and adjustment of urinary device: Secondary | ICD-10-CM | POA: Diagnosis not present

## 2015-12-14 DIAGNOSIS — L89154 Pressure ulcer of sacral region, stage 4: Secondary | ICD-10-CM | POA: Diagnosis not present

## 2015-12-14 DIAGNOSIS — Z86718 Personal history of other venous thrombosis and embolism: Secondary | ICD-10-CM | POA: Diagnosis not present

## 2015-12-14 DIAGNOSIS — I2510 Atherosclerotic heart disease of native coronary artery without angina pectoris: Secondary | ICD-10-CM | POA: Diagnosis not present

## 2015-12-14 DIAGNOSIS — C7951 Secondary malignant neoplasm of bone: Secondary | ICD-10-CM | POA: Diagnosis not present

## 2015-12-14 DIAGNOSIS — I5032 Chronic diastolic (congestive) heart failure: Secondary | ICD-10-CM | POA: Diagnosis not present

## 2015-12-14 DIAGNOSIS — G8311 Monoplegia of lower limb affecting right dominant side: Secondary | ICD-10-CM | POA: Diagnosis not present

## 2015-12-14 DIAGNOSIS — N183 Chronic kidney disease, stage 3 (moderate): Secondary | ICD-10-CM | POA: Diagnosis not present

## 2015-12-14 DIAGNOSIS — C3490 Malignant neoplasm of unspecified part of unspecified bronchus or lung: Secondary | ICD-10-CM | POA: Diagnosis not present

## 2015-12-14 DIAGNOSIS — Z87440 Personal history of urinary (tract) infections: Secondary | ICD-10-CM | POA: Diagnosis not present

## 2015-12-15 DIAGNOSIS — L89154 Pressure ulcer of sacral region, stage 4: Secondary | ICD-10-CM | POA: Diagnosis not present

## 2015-12-15 DIAGNOSIS — C7951 Secondary malignant neoplasm of bone: Secondary | ICD-10-CM | POA: Diagnosis not present

## 2015-12-15 DIAGNOSIS — Z87440 Personal history of urinary (tract) infections: Secondary | ICD-10-CM | POA: Diagnosis not present

## 2015-12-15 DIAGNOSIS — I5032 Chronic diastolic (congestive) heart failure: Secondary | ICD-10-CM | POA: Diagnosis not present

## 2015-12-15 DIAGNOSIS — N183 Chronic kidney disease, stage 3 (moderate): Secondary | ICD-10-CM | POA: Diagnosis not present

## 2015-12-15 DIAGNOSIS — I130 Hypertensive heart and chronic kidney disease with heart failure and stage 1 through stage 4 chronic kidney disease, or unspecified chronic kidney disease: Secondary | ICD-10-CM | POA: Diagnosis not present

## 2015-12-15 DIAGNOSIS — Z466 Encounter for fitting and adjustment of urinary device: Secondary | ICD-10-CM | POA: Diagnosis not present

## 2015-12-15 DIAGNOSIS — I2510 Atherosclerotic heart disease of native coronary artery without angina pectoris: Secondary | ICD-10-CM | POA: Diagnosis not present

## 2015-12-15 DIAGNOSIS — Z86718 Personal history of other venous thrombosis and embolism: Secondary | ICD-10-CM | POA: Diagnosis not present

## 2015-12-15 DIAGNOSIS — C3490 Malignant neoplasm of unspecified part of unspecified bronchus or lung: Secondary | ICD-10-CM | POA: Diagnosis not present

## 2015-12-15 DIAGNOSIS — G8311 Monoplegia of lower limb affecting right dominant side: Secondary | ICD-10-CM | POA: Diagnosis not present

## 2015-12-18 ENCOUNTER — Encounter (HOSPITAL_COMMUNITY): Payer: Self-pay | Admitting: Urology

## 2015-12-18 DIAGNOSIS — N183 Chronic kidney disease, stage 3 (moderate): Secondary | ICD-10-CM | POA: Diagnosis not present

## 2015-12-18 DIAGNOSIS — Z86718 Personal history of other venous thrombosis and embolism: Secondary | ICD-10-CM | POA: Diagnosis not present

## 2015-12-18 DIAGNOSIS — C3490 Malignant neoplasm of unspecified part of unspecified bronchus or lung: Secondary | ICD-10-CM | POA: Diagnosis not present

## 2015-12-18 DIAGNOSIS — I5032 Chronic diastolic (congestive) heart failure: Secondary | ICD-10-CM | POA: Diagnosis not present

## 2015-12-18 DIAGNOSIS — I2510 Atherosclerotic heart disease of native coronary artery without angina pectoris: Secondary | ICD-10-CM | POA: Diagnosis not present

## 2015-12-18 DIAGNOSIS — L89154 Pressure ulcer of sacral region, stage 4: Secondary | ICD-10-CM | POA: Diagnosis not present

## 2015-12-18 DIAGNOSIS — Z466 Encounter for fitting and adjustment of urinary device: Secondary | ICD-10-CM | POA: Diagnosis not present

## 2015-12-18 DIAGNOSIS — C7951 Secondary malignant neoplasm of bone: Secondary | ICD-10-CM | POA: Diagnosis not present

## 2015-12-18 DIAGNOSIS — I130 Hypertensive heart and chronic kidney disease with heart failure and stage 1 through stage 4 chronic kidney disease, or unspecified chronic kidney disease: Secondary | ICD-10-CM | POA: Diagnosis not present

## 2015-12-18 DIAGNOSIS — Z87440 Personal history of urinary (tract) infections: Secondary | ICD-10-CM | POA: Diagnosis not present

## 2015-12-18 DIAGNOSIS — G8311 Monoplegia of lower limb affecting right dominant side: Secondary | ICD-10-CM | POA: Diagnosis not present

## 2015-12-20 ENCOUNTER — Ambulatory Visit (HOSPITAL_BASED_OUTPATIENT_CLINIC_OR_DEPARTMENT_OTHER): Payer: Medicare Other | Admitting: Internal Medicine

## 2015-12-20 ENCOUNTER — Telehealth: Payer: Self-pay | Admitting: Internal Medicine

## 2015-12-20 ENCOUNTER — Encounter: Payer: Self-pay | Admitting: Internal Medicine

## 2015-12-20 ENCOUNTER — Other Ambulatory Visit: Payer: Self-pay | Admitting: Medical Oncology

## 2015-12-20 VITALS — BP 109/60 | HR 79 | Temp 97.6°F | Resp 18 | Ht 68.0 in

## 2015-12-20 DIAGNOSIS — Z87440 Personal history of urinary (tract) infections: Secondary | ICD-10-CM | POA: Diagnosis not present

## 2015-12-20 DIAGNOSIS — I2510 Atherosclerotic heart disease of native coronary artery without angina pectoris: Secondary | ICD-10-CM | POA: Diagnosis not present

## 2015-12-20 DIAGNOSIS — C7951 Secondary malignant neoplasm of bone: Secondary | ICD-10-CM | POA: Diagnosis not present

## 2015-12-20 DIAGNOSIS — I130 Hypertensive heart and chronic kidney disease with heart failure and stage 1 through stage 4 chronic kidney disease, or unspecified chronic kidney disease: Secondary | ICD-10-CM | POA: Diagnosis not present

## 2015-12-20 DIAGNOSIS — L89154 Pressure ulcer of sacral region, stage 4: Secondary | ICD-10-CM | POA: Diagnosis not present

## 2015-12-20 DIAGNOSIS — D638 Anemia in other chronic diseases classified elsewhere: Secondary | ICD-10-CM

## 2015-12-20 DIAGNOSIS — N183 Chronic kidney disease, stage 3 (moderate): Secondary | ICD-10-CM | POA: Diagnosis not present

## 2015-12-20 DIAGNOSIS — I5032 Chronic diastolic (congestive) heart failure: Secondary | ICD-10-CM | POA: Diagnosis not present

## 2015-12-20 DIAGNOSIS — Z86718 Personal history of other venous thrombosis and embolism: Secondary | ICD-10-CM | POA: Diagnosis not present

## 2015-12-20 DIAGNOSIS — C3490 Malignant neoplasm of unspecified part of unspecified bronchus or lung: Secondary | ICD-10-CM | POA: Diagnosis not present

## 2015-12-20 DIAGNOSIS — Z466 Encounter for fitting and adjustment of urinary device: Secondary | ICD-10-CM | POA: Diagnosis not present

## 2015-12-20 DIAGNOSIS — G8311 Monoplegia of lower limb affecting right dominant side: Secondary | ICD-10-CM | POA: Diagnosis not present

## 2015-12-20 NOTE — Telephone Encounter (Signed)
Gave pt cal & avs °

## 2015-12-20 NOTE — Progress Notes (Signed)
[facility] [phone]   [phone]  OFFICE PROGRESS NOTE  Wende Neighbors, MD [place] [address]  PRINCIPAL DIAGNOSIS: Stage IV non-small cell lung cancer diagnosed in March 2007, with disease recurrence in June 2016 with positive PDL 1 expression (50%).   PRIOR THERAPY:  1) Status post 6 cycles of systemic chemotherapy with carboplatin and docetaxel. Last dose was given January 16, 2006.  2) Palliative radiotherapy to the large right paraspinous mass with osseous invasion centered at L5 under the care of Dr. Valere Dross. 3) Systemic chemotherapy with carboplatin for AUC of 5 and paclitaxel 175 MG/M2 every 3 weeks with Neulasta support. First dose expected on 02/02/2015. Status post 2 cycles, last dose was given 02/23/2015 discontinued secondary to intolerance and mild disease progression. 4) Ketruda 200 mg IV every 3 weeks. First dose 04/14/2015. Status post 3 cycles. Treatment was discontinued after the patient had several hospitalization with recurrent infection and back abscess but were unrelated to her treatment.  CURRENT THERAPY: Observation.  DISEASE STAGE: Stage IV non-small cell lung cancer diagnosed in March of 2007.  CHEMOTHERAPY INTENT: Palliative  CURRENT # OF CHEMOTHERAPY CYCLES: 0  CURRENT ANTIEMETICS: None  CURRENT SMOKING STATUS: Nonsmoker  ORAL CHEMOTHERAPY AND CONSENT: None  CURRENT BISPHOSPHONATES USE: None  LIVING WILL AND CODE STATUS: Full code  INTERVAL HISTORY: [name] 82 y.o. female returns to the clinic today for followup visit accompanied by her daughter and grandson. The patient is feeling fine today with no specific complaints. She gained few pounds since her last visit. She is currently on observation with no specific complaints since her last visit 3 months ago.. She continues to have weakness of the lower extremities. She denied having any significant chest pain, shortness of breath, cough  or hemoptysis. She has no nausea or vomiting. She denied having any significant fever or chills. She had repeat CT scan of the chest, abdomen and pelvis performed earlier today and she is here for evaluation and discussion of her scan results.  MEDICAL HISTORY: Past Medical History  Diagnosis Date  . Hypertension   . Hypercholesterolemia   . CKD (chronic kidney disease)   . Coronary artery disease     a.  LHC (06/04/05): LHC done in [place] with high grade RCA => s/p BMS to RCA;  b.  Nuclear (09/14/09): Lexiscan; Inf infarct with mild peri-infarct ishemia, EF 52%; Low Risk.  Marland Kitchen GERD (gastroesophageal reflux disease)   . Chronic anemia   . Ischemic cardiomyopathy     a. Echo (07/26/13): Mild LVH, EF 35-40%, diff HK, inf AK, Gr 2 DD, Tr AI, mildly dilated Ao root, MAC, mild MR, mild LAE, mod reduced RVSF.  Marland Kitchen Chronic fatigue 04/04/2015  . Chronic fatigue 04/04/2015  . A-fib ([place])   . DVT (deep venous thrombosis) ([place])   . Chronic diastolic (congestive) heart failure ([place])   . Foot drop, right   . Non-small cell carcinoma of lung (HCC)     Stage IV;spinal cancer; had chemo and radiation and immune therapy    ALLERGIES:  is allergic to amlodipine; ciprofloxacin; mirtazapine; statins; aspirin; cephalexin; hydralazine; iron; ambien; lorazepam; sulfonamide derivatives; zolpidem; penicillins; shellfish allergy; and tape.  MEDICATIONS:  Current Outpatient Prescriptions  Medication Sig Dispense Refill  . acetaminophen (TYLENOL) 500 MG tablet Take 500 mg by mouth daily as needed for mild pain.    . diphenhydrAMINE (SOMINEX) 25 MG tablet Take 25 mg by mouth as needed for  allergies.    . diphenoxylate-atropine (LOMOTIL) 2.5-0.025 MG tablet Take by mouth 4 (four) times daily as needed for diarrhea or loose stools. Reported on 11/28/2015    . gabapentin (NEURONTIN) 100 MG capsule Take 100 mg by mouth 2 (two) times daily.    Marland Kitchen lidocaine-prilocaine (EMLA) cream Apply 1 application topically as  needed (for pain).    Marland Kitchen loperamide (IMODIUM) 2 MG capsule Take 1 capsule (2 mg total) by mouth every 6 (six) hours as needed for diarrhea or loose stools. 30 capsule 0  . metoprolol tartrate (LOPRESSOR) 25 MG tablet Take 0.5 tablets (12.5 mg total) by mouth at bedtime. 30 tablet 0  . pantoprazole (PROTONIX) 40 MG tablet Take 40 mg by mouth at bedtime.    Marland Kitchen SANTYL ointment Apply 1 application topically at bedtime as needed (bed sores). For bed sores     No current facility-administered medications for this visit.    SURGICAL HISTORY:  Past Surgical History  Procedure Laterality Date  . Hematoma evacuation  December 2006    groin  . Appendectomy    . Cataract extraction Bilateral   . Hemorroidectomy    . Laminectomy    . Abdominal hysterectomy    . Cholecystectomy  2011  . Ercp  2011    with stone extraction, done same day as cholecystectomy  . Portacath placement    . Tonsillectomy    . Cystoscopy      with stent placement  . Cystoscopy w/ ureteral stent placement Right 12/12/2015    Procedure: CYSTOSCOPY WITH RETROGRADE PYELOGRAM WITH STENT EXCHANGE;  Surgeon: Franchot Gallo, MD;  Location: [facility];  Service: Urology;  Laterality: Right;    REVIEW OF SYSTEMS:  A comprehensive review of systems was negative except for: Constitutional: positive for fatigue Musculoskeletal: positive for muscle weakness   PHYSICAL EXAMINATION: General appearance: alert, cooperative and no distress Head: Normocephalic, without obvious abnormality, atraumatic Neck: no adenopathy Lymph nodes: Cervical, supraclavicular, and axillary nodes normal. Resp: clear to auscultation bilaterally Back: symmetric, no curvature. ROM normal. No CVA tenderness., Decubitus ulcer in the lower back Cardio: regular rate and rhythm, S1, S2 normal, no murmur, click, rub or gallop GI: soft, non-tender; bowel sounds normal; no masses,  no organomegaly Extremities: extremities normal, atraumatic, no cyanosis or  edema Neurologic: Alert and oriented X 3, normal strength and tone. Normal symmetric reflexes. Normal coordination and gait  ECOG PERFORMANCE STATUS: 2 - Symptomatic, <50% confined to bed  Blood pressure 109/60, pulse 79, temperature 97.6 F (36.4 C), temperature source Oral, resp. rate 18, height '5\' 8"'$  (1.727 m), SpO2 98 %.  LABORATORY DATA: Lab Results  Component Value Date   WBC 7.8 12/13/2015   HGB 10.3* 12/13/2015   HCT 31.6* 12/13/2015   MCV 85.0 12/13/2015   PLT 293 12/13/2015      Chemistry      Component Value Date/Time   NA 138 12/13/2015 0831   NA 137 12/07/2015 0853   NA 143 12/23/2011 0830   K 3.6 12/13/2015 0831   K 4.2 12/07/2015 0853   K 4.7 12/23/2011 0830   CL 100* 12/07/2015 0853   CL 104 06/19/2012 0958   CL 101 12/23/2011 0830   CO2 22 12/13/2015 0831   CO2 25 12/07/2015 0853   CO2 27 12/23/2011 0830   BUN 48.6* 12/13/2015 0831   BUN 47* 12/07/2015 0853   BUN 25* 12/23/2011 0830   CREATININE 3.6* 12/13/2015 0831   CREATININE 3.41* 12/07/2015 0853   CREATININE 2.09*  02/01/2014 1545      Component Value Date/Time   CALCIUM 8.3* 12/13/2015 0831   CALCIUM 8.1* 12/07/2015 0853   CALCIUM 9.0 12/23/2011 0830   ALKPHOS 121 12/13/2015 0831   ALKPHOS 102 11/28/2015 0110   ALKPHOS 76 12/23/2011 0830   AST 13 12/13/2015 0831   AST 12* 11/28/2015 0110   AST 16 12/23/2011 0830   ALT <9 12/13/2015 0831   ALT 8* 11/28/2015 0110   ALT 16 12/23/2011 0830   BILITOT 0.33 12/13/2015 0831   BILITOT 0.6 11/28/2015 0110   BILITOT 0.60 12/23/2011 0830       RADIOGRAPHIC STUDIES: Ct Abdomen Pelvis Wo Contrast  12/13/2015  CLINICAL DATA:  Restaging lung cancer EXAM: CT CHEST, ABDOMEN AND PELVIS WITHOUT CONTRAST TECHNIQUE: Multidetector CT imaging of the chest, abdomen and pelvis was performed following the standard protocol without IV contrast. COMPARISON:  09/20/15 FINDINGS: CT CHEST FINDINGS Mediastinum/Lymph Nodes: Normal heart size. Aortic atherosclerosis  noted. Calcifications involving the RCA, LAD and left circumflex coronary artery noted. No mediastinal or hilar adenopathy identified. Axillary or supraclavicular adenopathy noted. Lungs/Pleura: No pleural fluid. Moderate changes of centrilobular emphysema. The left upper lobe lung nodule referenced on previous exam measures 9 mm, image 60 of series 4. Previously 11 mm. Postsurgical change involving the right lower lobe is again identified. Scarring and pleural thickening within the posterior right lower lobe, image 127 of series 4. Nodule within the right upper lobe measures 4 mm, image 91 of series 4. Unchanged previous exam. Also unchanged is a cystic appearing nodule within the right apex measuring 7 mm, image 26 of series 4. Musculoskeletal: Curvature of the lumbar spine appears convex towards the right. No aggressive lytic or sclerotic bone lesions identified. CT ABDOMEN PELVIS FINDINGS Hepatobiliary: No suspicious liver abnormality. Previous coli cystectomy. No biliary dilatation. Pancreas: Normal appearance of the pancreas. Spleen: Within normal limits in size. Adrenals/Urinary Tract: The adrenal glands are normal. Bilateral renal atrophy identified. Cysts of varying densities are again noted arising from the upper pole of the right kidney. These are incompletely characterized without IV contrast. The largest measures 1.5 cm, image 64 of series 2. A right ureteral stent is place. Urinary bladder is collapsed around a Foley catheter. Stomach/Bowel: The stomach appears normal. Of the small bowel loops are mildly increased in caliber measuring up to 2.8 cm. Normal caliber of the colon. Vascular/Lymphatic: Calcified atherosclerotic disease involves the abdominal aorta. No aneurysm. No enlarged retroperitoneal or mesenteric adenopathy. No enlarged pelvic or inguinal lymph nodes. Reproductive: Previous hysterectomy.  No adnexal mass. Other: There is no ascites or focal fluid collections within the abdomen or  pelvis. Musculoskeletal: There is been decrease soft tissue mass which is involving the right side of sacrum. Currently this measures 4.0 x 3.4 cm, image 98 of series 2. Previous 5.3 x 3.6 cm. The soft tissue lesion involving the right side of the L4 vertebra measures approximately 2.3 x 2.0 cm, image number 86 of series 2. On the previous exam this measured 2.3 x 2.8 cm per no new or progressive disease identified at this time. IMPRESSION: 1. Similar appearance of pleural thickening and subpleural scarring overlying the posterior right lower lobe. 2. Small nodules in the left and right upper lobes are stable to slightly decreased in size from previous exam. 3. Destructive lesions involving the lower lumbar spine and right side of sacrum are mildly decreased in size previous exam. 4. No new or progressive disease identified. Electronically Signed   By: Kerby Moors  M.D.   On: 12/13/2015 12:04   Ct Abdomen Pelvis Wo Contrast  11/28/2015  CLINICAL DATA:  Gradually worsening nausea vomiting and diarrhea, onset 2 weeks ago but worsened tonight. EXAM: CT ABDOMEN AND PELVIS WITHOUT CONTRAST TECHNIQUE: Multidetector CT imaging of the abdomen and pelvis was performed following the standard protocol without IV contrast. COMPARISON:  06/14/2015 FINDINGS: Lower chest: Posterior right lung base opacity, possibly rounded atelectasis, not significantly changed from 09/20/2015. No acute findings in the lower chest. Hepatobiliary: No focal liver lesion is evident on this unenhanced scan. Cholecystectomy. Unremarkable bile ducts. Pancreas: Unremarkable unenhanced appearances of the pancreas. Spleen: No significant abnormality Adrenals/Urinary Tract: Adrenals are normal. Kidneys are atrophic. Indeterminate 1.6 cm upper pole right renal lesion, unchanged. Satisfactorily positioned right ureteral stent. Urinary bladder is decompressed around a Foley catheter. Stomach/Bowel: No bowel obstruction. No acute inflammatory changes.  Stomach, small bowel and colon are unremarkable. Vascular/Lymphatic: Extensive aortic calcification. Generous caliber of the suprarenal abdominal aorta, unchanged. No discrete aneurysm. Reproductive: No adnexal abnormality.  Hysterectomy. Other: No focal inflammatory changes are evident in the abdomen or pelvis. No extraluminal air. No ascites. Musculoskeletal: The sacral metastasis is smaller, with reparative sclerosis. The L5 and L4 destructive lesions also now exhibit reparative sclerosis. No new skeletal lesion is evident. The right paraspinal dorsal soft tissue collection has not recurred. It was drained on 06/17/2015. IMPRESSION: No acute findings are evident in the abdomen or pelvis. The known skeletal metastatic disease is stable or reduced, with evidence of reparative sclerosis. The right paraspinous dorsal soft tissue collection has resolved. Electronically Signed   By: Andreas Newport M.D.   On: 11/28/2015 03:53   Ct Chest Wo Contrast  12/13/2015  CLINICAL DATA:  Restaging lung cancer EXAM: CT CHEST, ABDOMEN AND PELVIS WITHOUT CONTRAST TECHNIQUE: Multidetector CT imaging of the chest, abdomen and pelvis was performed following the standard protocol without IV contrast. COMPARISON:  09/20/15 FINDINGS: CT CHEST FINDINGS Mediastinum/Lymph Nodes: Normal heart size. Aortic atherosclerosis noted. Calcifications involving the RCA, LAD and left circumflex coronary artery noted. No mediastinal or hilar adenopathy identified. Axillary or supraclavicular adenopathy noted. Lungs/Pleura: No pleural fluid. Moderate changes of centrilobular emphysema. The left upper lobe lung nodule referenced on previous exam measures 9 mm, image 60 of series 4. Previously 11 mm. Postsurgical change involving the right lower lobe is again identified. Scarring and pleural thickening within the posterior right lower lobe, image 127 of series 4. Nodule within the right upper lobe measures 4 mm, image 91 of series 4. Unchanged previous  exam. Also unchanged is a cystic appearing nodule within the right apex measuring 7 mm, image 26 of series 4. Musculoskeletal: Curvature of the lumbar spine appears convex towards the right. No aggressive lytic or sclerotic bone lesions identified. CT ABDOMEN PELVIS FINDINGS Hepatobiliary: No suspicious liver abnormality. Previous coli cystectomy. No biliary dilatation. Pancreas: Normal appearance of the pancreas. Spleen: Within normal limits in size. Adrenals/Urinary Tract: The adrenal glands are normal. Bilateral renal atrophy identified. Cysts of varying densities are again noted arising from the upper pole of the right kidney. These are incompletely characterized without IV contrast. The largest measures 1.5 cm, image 64 of series 2. A right ureteral stent is place. Urinary bladder is collapsed around a Foley catheter. Stomach/Bowel: The stomach appears normal. Of the small bowel loops are mildly increased in caliber measuring up to 2.8 cm. Normal caliber of the colon. Vascular/Lymphatic: Calcified atherosclerotic disease involves the abdominal aorta. No aneurysm. No enlarged retroperitoneal or mesenteric adenopathy. No enlarged  pelvic or inguinal lymph nodes. Reproductive: Previous hysterectomy.  No adnexal mass. Other: There is no ascites or focal fluid collections within the abdomen or pelvis. Musculoskeletal: There is been decrease soft tissue mass which is involving the right side of sacrum. Currently this measures 4.0 x 3.4 cm, image 98 of series 2. Previous 5.3 x 3.6 cm. The soft tissue lesion involving the right side of the L4 vertebra measures approximately 2.3 x 2.0 cm, image number 86 of series 2. On the previous exam this measured 2.3 x 2.8 cm per no new or progressive disease identified at this time. IMPRESSION: 1. Similar appearance of pleural thickening and subpleural scarring overlying the posterior right lower lobe. 2. Small nodules in the left and right upper lobes are stable to slightly  decreased in size from previous exam. 3. Destructive lesions involving the lower lumbar spine and right side of sacrum are mildly decreased in size previous exam. 4. No new or progressive disease identified. Electronically Signed   By: Kerby Moors M.D.   On: 12/13/2015 12:04   Dg C-arm 1-60 Min-no Report  12/12/2015  CLINICAL DATA: stent replacement C-ARM 1-60 MINUTES Fluoroscopy was utilized by the requesting physician.  No radiographic interpretation.    ASSESSMENT AND PLAN: This is a very pleasant 82 years old white female with history of stage IV non-small cell lung cancer diagnosed in March of 2007 status post 6 cycles of systemic chemotherapy with carboplatin and docetaxel and has been observation since that time with no evidence for disease progression for almost 9 years.  She was found recently to have evidence for disease recurrence with large lumbar paraspinous mass with osseous invasion and the core biopsy was consistent with metastatic squamous cell carcinoma.  She is status post palliative radiotherapy to the soft tissue mass in the lumbar area. The patient underwent systemic chemotherapy with carboplatin and paclitaxel is status post 2 cycle but she has rough time tolerating her systemic chemotherapy with significant pancytopenia, fatigue and weakness.  I discussed the scan results with the patient and her family. I recommended for her to discontinue her current treatment with systemic chemotherapy with carboplatin and paclitaxel secondary to intolerance and mild disease progression. The patient has 50%  PDL 1 expression on her tumor tissue. She was started on treatment with Nat Math (pembrolizumab) for 3 cycles last dose was given 05/26/2015 but her treatment has been on hold for the last few months secondary to frequent hospitalization for back abscess and septicemia.  The recent CT scan of the chest, abdomen and pelvis showed no evidence for disease progression and there was mild  decrease in the size of the pulmonary nodule as well as the destructive lesion in the lumbar spine. I recommended for her to continue on observation with repeat CT scan of the chest, abdomen and pelvis in 3 months. For the weakness of the lower extremities, the patient will contact her primary care physician for physical therapy orders. For pain management, the patient will continue on Vicodin when necessary. The patient was advised to call immediately if she has any concerning symptoms in the interval.  All questions were answered. The patient knows to call the clinic with any problems, questions or concerns. We can certainly see the patient much sooner if necessary.  Disclaimer: This note was dictated with voice recognition software. Similar sounding words can inadvertently be transcribed and may not be corrected upon review.

## 2015-12-21 DIAGNOSIS — I5032 Chronic diastolic (congestive) heart failure: Secondary | ICD-10-CM | POA: Diagnosis not present

## 2015-12-21 DIAGNOSIS — Z466 Encounter for fitting and adjustment of urinary device: Secondary | ICD-10-CM | POA: Diagnosis not present

## 2015-12-21 DIAGNOSIS — N183 Chronic kidney disease, stage 3 (moderate): Secondary | ICD-10-CM | POA: Diagnosis not present

## 2015-12-21 DIAGNOSIS — L89154 Pressure ulcer of sacral region, stage 4: Secondary | ICD-10-CM | POA: Diagnosis not present

## 2015-12-21 DIAGNOSIS — Z87440 Personal history of urinary (tract) infections: Secondary | ICD-10-CM | POA: Diagnosis not present

## 2015-12-21 DIAGNOSIS — I130 Hypertensive heart and chronic kidney disease with heart failure and stage 1 through stage 4 chronic kidney disease, or unspecified chronic kidney disease: Secondary | ICD-10-CM | POA: Diagnosis not present

## 2015-12-21 DIAGNOSIS — G8311 Monoplegia of lower limb affecting right dominant side: Secondary | ICD-10-CM | POA: Diagnosis not present

## 2015-12-21 DIAGNOSIS — Z86718 Personal history of other venous thrombosis and embolism: Secondary | ICD-10-CM | POA: Diagnosis not present

## 2015-12-21 DIAGNOSIS — C7951 Secondary malignant neoplasm of bone: Secondary | ICD-10-CM | POA: Diagnosis not present

## 2015-12-21 DIAGNOSIS — R222 Localized swelling, mass and lump, trunk: Secondary | ICD-10-CM | POA: Diagnosis not present

## 2015-12-21 DIAGNOSIS — C3490 Malignant neoplasm of unspecified part of unspecified bronchus or lung: Secondary | ICD-10-CM | POA: Diagnosis not present

## 2015-12-21 DIAGNOSIS — I2510 Atherosclerotic heart disease of native coronary artery without angina pectoris: Secondary | ICD-10-CM | POA: Diagnosis not present

## 2015-12-23 ENCOUNTER — Telehealth: Payer: Self-pay | Admitting: Internal Medicine

## 2015-12-23 NOTE — Telephone Encounter (Signed)
Lvm advising appts 8/23 - 04/17/16. I've also mailed pt an appt calendar.

## 2015-12-24 DIAGNOSIS — I2510 Atherosclerotic heart disease of native coronary artery without angina pectoris: Secondary | ICD-10-CM | POA: Diagnosis not present

## 2015-12-24 DIAGNOSIS — L89154 Pressure ulcer of sacral region, stage 4: Secondary | ICD-10-CM | POA: Diagnosis not present

## 2015-12-24 DIAGNOSIS — I130 Hypertensive heart and chronic kidney disease with heart failure and stage 1 through stage 4 chronic kidney disease, or unspecified chronic kidney disease: Secondary | ICD-10-CM | POA: Diagnosis not present

## 2015-12-24 DIAGNOSIS — Z86718 Personal history of other venous thrombosis and embolism: Secondary | ICD-10-CM | POA: Diagnosis not present

## 2015-12-24 DIAGNOSIS — N183 Chronic kidney disease, stage 3 (moderate): Secondary | ICD-10-CM | POA: Diagnosis not present

## 2015-12-24 DIAGNOSIS — C7951 Secondary malignant neoplasm of bone: Secondary | ICD-10-CM | POA: Diagnosis not present

## 2015-12-24 DIAGNOSIS — C3490 Malignant neoplasm of unspecified part of unspecified bronchus or lung: Secondary | ICD-10-CM | POA: Diagnosis not present

## 2015-12-24 DIAGNOSIS — Z87440 Personal history of urinary (tract) infections: Secondary | ICD-10-CM | POA: Diagnosis not present

## 2015-12-24 DIAGNOSIS — G8311 Monoplegia of lower limb affecting right dominant side: Secondary | ICD-10-CM | POA: Diagnosis not present

## 2015-12-24 DIAGNOSIS — I5032 Chronic diastolic (congestive) heart failure: Secondary | ICD-10-CM | POA: Diagnosis not present

## 2015-12-24 DIAGNOSIS — Z466 Encounter for fitting and adjustment of urinary device: Secondary | ICD-10-CM | POA: Diagnosis not present

## 2015-12-25 DIAGNOSIS — N183 Chronic kidney disease, stage 3 (moderate): Secondary | ICD-10-CM | POA: Diagnosis not present

## 2015-12-25 DIAGNOSIS — C7951 Secondary malignant neoplasm of bone: Secondary | ICD-10-CM | POA: Diagnosis not present

## 2015-12-25 DIAGNOSIS — Z86718 Personal history of other venous thrombosis and embolism: Secondary | ICD-10-CM | POA: Diagnosis not present

## 2015-12-25 DIAGNOSIS — Z87440 Personal history of urinary (tract) infections: Secondary | ICD-10-CM | POA: Diagnosis not present

## 2015-12-25 DIAGNOSIS — C3490 Malignant neoplasm of unspecified part of unspecified bronchus or lung: Secondary | ICD-10-CM | POA: Diagnosis not present

## 2015-12-25 DIAGNOSIS — I5032 Chronic diastolic (congestive) heart failure: Secondary | ICD-10-CM | POA: Diagnosis not present

## 2015-12-25 DIAGNOSIS — L89154 Pressure ulcer of sacral region, stage 4: Secondary | ICD-10-CM | POA: Diagnosis not present

## 2015-12-25 DIAGNOSIS — Z466 Encounter for fitting and adjustment of urinary device: Secondary | ICD-10-CM | POA: Diagnosis not present

## 2015-12-25 DIAGNOSIS — I2510 Atherosclerotic heart disease of native coronary artery without angina pectoris: Secondary | ICD-10-CM | POA: Diagnosis not present

## 2015-12-25 DIAGNOSIS — I130 Hypertensive heart and chronic kidney disease with heart failure and stage 1 through stage 4 chronic kidney disease, or unspecified chronic kidney disease: Secondary | ICD-10-CM | POA: Diagnosis not present

## 2015-12-25 DIAGNOSIS — G8311 Monoplegia of lower limb affecting right dominant side: Secondary | ICD-10-CM | POA: Diagnosis not present

## 2015-12-27 ENCOUNTER — Ambulatory Visit: Payer: Medicare Other | Admitting: Cardiology

## 2015-12-28 DIAGNOSIS — Z466 Encounter for fitting and adjustment of urinary device: Secondary | ICD-10-CM | POA: Diagnosis not present

## 2015-12-28 DIAGNOSIS — C3490 Malignant neoplasm of unspecified part of unspecified bronchus or lung: Secondary | ICD-10-CM | POA: Diagnosis not present

## 2015-12-28 DIAGNOSIS — Z86718 Personal history of other venous thrombosis and embolism: Secondary | ICD-10-CM | POA: Diagnosis not present

## 2015-12-28 DIAGNOSIS — I130 Hypertensive heart and chronic kidney disease with heart failure and stage 1 through stage 4 chronic kidney disease, or unspecified chronic kidney disease: Secondary | ICD-10-CM | POA: Diagnosis not present

## 2015-12-28 DIAGNOSIS — C7951 Secondary malignant neoplasm of bone: Secondary | ICD-10-CM | POA: Diagnosis not present

## 2015-12-28 DIAGNOSIS — I2510 Atherosclerotic heart disease of native coronary artery without angina pectoris: Secondary | ICD-10-CM | POA: Diagnosis not present

## 2015-12-28 DIAGNOSIS — L89154 Pressure ulcer of sacral region, stage 4: Secondary | ICD-10-CM | POA: Diagnosis not present

## 2015-12-28 DIAGNOSIS — Z87440 Personal history of urinary (tract) infections: Secondary | ICD-10-CM | POA: Diagnosis not present

## 2015-12-28 DIAGNOSIS — N183 Chronic kidney disease, stage 3 (moderate): Secondary | ICD-10-CM | POA: Diagnosis not present

## 2015-12-28 DIAGNOSIS — G8311 Monoplegia of lower limb affecting right dominant side: Secondary | ICD-10-CM | POA: Diagnosis not present

## 2015-12-28 DIAGNOSIS — I5032 Chronic diastolic (congestive) heart failure: Secondary | ICD-10-CM | POA: Diagnosis not present

## 2016-01-02 DIAGNOSIS — Z466 Encounter for fitting and adjustment of urinary device: Secondary | ICD-10-CM | POA: Diagnosis not present

## 2016-01-02 DIAGNOSIS — G8311 Monoplegia of lower limb affecting right dominant side: Secondary | ICD-10-CM | POA: Diagnosis not present

## 2016-01-02 DIAGNOSIS — C3490 Malignant neoplasm of unspecified part of unspecified bronchus or lung: Secondary | ICD-10-CM | POA: Diagnosis not present

## 2016-01-02 DIAGNOSIS — Z87440 Personal history of urinary (tract) infections: Secondary | ICD-10-CM | POA: Diagnosis not present

## 2016-01-02 DIAGNOSIS — L89154 Pressure ulcer of sacral region, stage 4: Secondary | ICD-10-CM | POA: Diagnosis not present

## 2016-01-02 DIAGNOSIS — C7951 Secondary malignant neoplasm of bone: Secondary | ICD-10-CM | POA: Diagnosis not present

## 2016-01-02 DIAGNOSIS — N183 Chronic kidney disease, stage 3 (moderate): Secondary | ICD-10-CM | POA: Diagnosis not present

## 2016-01-02 DIAGNOSIS — Z86718 Personal history of other venous thrombosis and embolism: Secondary | ICD-10-CM | POA: Diagnosis not present

## 2016-01-02 DIAGNOSIS — I5032 Chronic diastolic (congestive) heart failure: Secondary | ICD-10-CM | POA: Diagnosis not present

## 2016-01-02 DIAGNOSIS — I130 Hypertensive heart and chronic kidney disease with heart failure and stage 1 through stage 4 chronic kidney disease, or unspecified chronic kidney disease: Secondary | ICD-10-CM | POA: Diagnosis not present

## 2016-01-02 DIAGNOSIS — I2510 Atherosclerotic heart disease of native coronary artery without angina pectoris: Secondary | ICD-10-CM | POA: Diagnosis not present

## 2016-01-05 ENCOUNTER — Ambulatory Visit: Payer: Medicare Other | Admitting: Nurse Practitioner

## 2016-01-05 DIAGNOSIS — N183 Chronic kidney disease, stage 3 (moderate): Secondary | ICD-10-CM | POA: Diagnosis not present

## 2016-01-05 DIAGNOSIS — Z87440 Personal history of urinary (tract) infections: Secondary | ICD-10-CM | POA: Diagnosis not present

## 2016-01-05 DIAGNOSIS — C7951 Secondary malignant neoplasm of bone: Secondary | ICD-10-CM | POA: Diagnosis not present

## 2016-01-05 DIAGNOSIS — Z466 Encounter for fitting and adjustment of urinary device: Secondary | ICD-10-CM | POA: Diagnosis not present

## 2016-01-05 DIAGNOSIS — I2510 Atherosclerotic heart disease of native coronary artery without angina pectoris: Secondary | ICD-10-CM | POA: Diagnosis not present

## 2016-01-05 DIAGNOSIS — G8311 Monoplegia of lower limb affecting right dominant side: Secondary | ICD-10-CM | POA: Diagnosis not present

## 2016-01-05 DIAGNOSIS — C3490 Malignant neoplasm of unspecified part of unspecified bronchus or lung: Secondary | ICD-10-CM | POA: Diagnosis not present

## 2016-01-05 DIAGNOSIS — I5032 Chronic diastolic (congestive) heart failure: Secondary | ICD-10-CM | POA: Diagnosis not present

## 2016-01-05 DIAGNOSIS — L89154 Pressure ulcer of sacral region, stage 4: Secondary | ICD-10-CM | POA: Diagnosis not present

## 2016-01-05 DIAGNOSIS — Z86718 Personal history of other venous thrombosis and embolism: Secondary | ICD-10-CM | POA: Diagnosis not present

## 2016-01-05 DIAGNOSIS — I130 Hypertensive heart and chronic kidney disease with heart failure and stage 1 through stage 4 chronic kidney disease, or unspecified chronic kidney disease: Secondary | ICD-10-CM | POA: Diagnosis not present

## 2016-01-06 DIAGNOSIS — D0220 Carcinoma in situ of unspecified bronchus and lung: Secondary | ICD-10-CM | POA: Diagnosis not present

## 2016-01-06 DIAGNOSIS — I509 Heart failure, unspecified: Secondary | ICD-10-CM | POA: Diagnosis not present

## 2016-01-06 DIAGNOSIS — L89153 Pressure ulcer of sacral region, stage 3: Secondary | ICD-10-CM | POA: Diagnosis not present

## 2016-01-06 DIAGNOSIS — I8291 Chronic embolism and thrombosis of unspecified vein: Secondary | ICD-10-CM | POA: Diagnosis not present

## 2016-01-09 DIAGNOSIS — N183 Chronic kidney disease, stage 3 (moderate): Secondary | ICD-10-CM | POA: Diagnosis not present

## 2016-01-09 DIAGNOSIS — I2510 Atherosclerotic heart disease of native coronary artery without angina pectoris: Secondary | ICD-10-CM | POA: Diagnosis not present

## 2016-01-09 DIAGNOSIS — Z86718 Personal history of other venous thrombosis and embolism: Secondary | ICD-10-CM | POA: Diagnosis not present

## 2016-01-09 DIAGNOSIS — L89154 Pressure ulcer of sacral region, stage 4: Secondary | ICD-10-CM | POA: Diagnosis not present

## 2016-01-09 DIAGNOSIS — C7951 Secondary malignant neoplasm of bone: Secondary | ICD-10-CM | POA: Diagnosis not present

## 2016-01-09 DIAGNOSIS — C3490 Malignant neoplasm of unspecified part of unspecified bronchus or lung: Secondary | ICD-10-CM | POA: Diagnosis not present

## 2016-01-09 DIAGNOSIS — I130 Hypertensive heart and chronic kidney disease with heart failure and stage 1 through stage 4 chronic kidney disease, or unspecified chronic kidney disease: Secondary | ICD-10-CM | POA: Diagnosis not present

## 2016-01-09 DIAGNOSIS — Z87440 Personal history of urinary (tract) infections: Secondary | ICD-10-CM | POA: Diagnosis not present

## 2016-01-09 DIAGNOSIS — Z466 Encounter for fitting and adjustment of urinary device: Secondary | ICD-10-CM | POA: Diagnosis not present

## 2016-01-09 DIAGNOSIS — I5032 Chronic diastolic (congestive) heart failure: Secondary | ICD-10-CM | POA: Diagnosis not present

## 2016-01-09 DIAGNOSIS — G8311 Monoplegia of lower limb affecting right dominant side: Secondary | ICD-10-CM | POA: Diagnosis not present

## 2016-01-11 DIAGNOSIS — Z86718 Personal history of other venous thrombosis and embolism: Secondary | ICD-10-CM | POA: Diagnosis not present

## 2016-01-11 DIAGNOSIS — C3490 Malignant neoplasm of unspecified part of unspecified bronchus or lung: Secondary | ICD-10-CM | POA: Diagnosis not present

## 2016-01-11 DIAGNOSIS — C7951 Secondary malignant neoplasm of bone: Secondary | ICD-10-CM | POA: Diagnosis not present

## 2016-01-11 DIAGNOSIS — L89154 Pressure ulcer of sacral region, stage 4: Secondary | ICD-10-CM | POA: Diagnosis not present

## 2016-01-11 DIAGNOSIS — Z466 Encounter for fitting and adjustment of urinary device: Secondary | ICD-10-CM | POA: Diagnosis not present

## 2016-01-11 DIAGNOSIS — Z87440 Personal history of urinary (tract) infections: Secondary | ICD-10-CM | POA: Diagnosis not present

## 2016-01-11 DIAGNOSIS — I130 Hypertensive heart and chronic kidney disease with heart failure and stage 1 through stage 4 chronic kidney disease, or unspecified chronic kidney disease: Secondary | ICD-10-CM | POA: Diagnosis not present

## 2016-01-11 DIAGNOSIS — N183 Chronic kidney disease, stage 3 (moderate): Secondary | ICD-10-CM | POA: Diagnosis not present

## 2016-01-11 DIAGNOSIS — G8311 Monoplegia of lower limb affecting right dominant side: Secondary | ICD-10-CM | POA: Diagnosis not present

## 2016-01-11 DIAGNOSIS — I5032 Chronic diastolic (congestive) heart failure: Secondary | ICD-10-CM | POA: Diagnosis not present

## 2016-01-11 DIAGNOSIS — I2510 Atherosclerotic heart disease of native coronary artery without angina pectoris: Secondary | ICD-10-CM | POA: Diagnosis not present

## 2016-01-16 DIAGNOSIS — N183 Chronic kidney disease, stage 3 (moderate): Secondary | ICD-10-CM | POA: Diagnosis not present

## 2016-01-16 DIAGNOSIS — Z466 Encounter for fitting and adjustment of urinary device: Secondary | ICD-10-CM | POA: Diagnosis not present

## 2016-01-16 DIAGNOSIS — Z86718 Personal history of other venous thrombosis and embolism: Secondary | ICD-10-CM | POA: Diagnosis not present

## 2016-01-16 DIAGNOSIS — I130 Hypertensive heart and chronic kidney disease with heart failure and stage 1 through stage 4 chronic kidney disease, or unspecified chronic kidney disease: Secondary | ICD-10-CM | POA: Diagnosis not present

## 2016-01-16 DIAGNOSIS — Z87440 Personal history of urinary (tract) infections: Secondary | ICD-10-CM | POA: Diagnosis not present

## 2016-01-16 DIAGNOSIS — L89154 Pressure ulcer of sacral region, stage 4: Secondary | ICD-10-CM | POA: Diagnosis not present

## 2016-01-16 DIAGNOSIS — I2510 Atherosclerotic heart disease of native coronary artery without angina pectoris: Secondary | ICD-10-CM | POA: Diagnosis not present

## 2016-01-16 DIAGNOSIS — I5032 Chronic diastolic (congestive) heart failure: Secondary | ICD-10-CM | POA: Diagnosis not present

## 2016-01-16 DIAGNOSIS — G8311 Monoplegia of lower limb affecting right dominant side: Secondary | ICD-10-CM | POA: Diagnosis not present

## 2016-01-16 DIAGNOSIS — C3490 Malignant neoplasm of unspecified part of unspecified bronchus or lung: Secondary | ICD-10-CM | POA: Diagnosis not present

## 2016-01-16 DIAGNOSIS — C7951 Secondary malignant neoplasm of bone: Secondary | ICD-10-CM | POA: Diagnosis not present

## 2016-01-17 DIAGNOSIS — N183 Chronic kidney disease, stage 3 (moderate): Secondary | ICD-10-CM | POA: Diagnosis not present

## 2016-01-17 DIAGNOSIS — I5032 Chronic diastolic (congestive) heart failure: Secondary | ICD-10-CM | POA: Diagnosis not present

## 2016-01-17 DIAGNOSIS — C3490 Malignant neoplasm of unspecified part of unspecified bronchus or lung: Secondary | ICD-10-CM | POA: Diagnosis not present

## 2016-01-17 DIAGNOSIS — Z86718 Personal history of other venous thrombosis and embolism: Secondary | ICD-10-CM | POA: Diagnosis not present

## 2016-01-17 DIAGNOSIS — Z87440 Personal history of urinary (tract) infections: Secondary | ICD-10-CM | POA: Diagnosis not present

## 2016-01-17 DIAGNOSIS — C7951 Secondary malignant neoplasm of bone: Secondary | ICD-10-CM | POA: Diagnosis not present

## 2016-01-17 DIAGNOSIS — I2510 Atherosclerotic heart disease of native coronary artery without angina pectoris: Secondary | ICD-10-CM | POA: Diagnosis not present

## 2016-01-17 DIAGNOSIS — G8311 Monoplegia of lower limb affecting right dominant side: Secondary | ICD-10-CM | POA: Diagnosis not present

## 2016-01-17 DIAGNOSIS — L89154 Pressure ulcer of sacral region, stage 4: Secondary | ICD-10-CM | POA: Diagnosis not present

## 2016-01-17 DIAGNOSIS — Z466 Encounter for fitting and adjustment of urinary device: Secondary | ICD-10-CM | POA: Diagnosis not present

## 2016-01-17 DIAGNOSIS — I130 Hypertensive heart and chronic kidney disease with heart failure and stage 1 through stage 4 chronic kidney disease, or unspecified chronic kidney disease: Secondary | ICD-10-CM | POA: Diagnosis not present

## 2016-01-18 DIAGNOSIS — I5032 Chronic diastolic (congestive) heart failure: Secondary | ICD-10-CM | POA: Diagnosis not present

## 2016-01-18 DIAGNOSIS — G8311 Monoplegia of lower limb affecting right dominant side: Secondary | ICD-10-CM | POA: Diagnosis not present

## 2016-01-18 DIAGNOSIS — L89154 Pressure ulcer of sacral region, stage 4: Secondary | ICD-10-CM | POA: Diagnosis not present

## 2016-01-18 DIAGNOSIS — C7951 Secondary malignant neoplasm of bone: Secondary | ICD-10-CM | POA: Diagnosis not present

## 2016-01-18 DIAGNOSIS — I2510 Atherosclerotic heart disease of native coronary artery without angina pectoris: Secondary | ICD-10-CM | POA: Diagnosis not present

## 2016-01-18 DIAGNOSIS — I130 Hypertensive heart and chronic kidney disease with heart failure and stage 1 through stage 4 chronic kidney disease, or unspecified chronic kidney disease: Secondary | ICD-10-CM | POA: Diagnosis not present

## 2016-01-18 DIAGNOSIS — Z466 Encounter for fitting and adjustment of urinary device: Secondary | ICD-10-CM | POA: Diagnosis not present

## 2016-01-18 DIAGNOSIS — N183 Chronic kidney disease, stage 3 (moderate): Secondary | ICD-10-CM | POA: Diagnosis not present

## 2016-01-18 DIAGNOSIS — Z86718 Personal history of other venous thrombosis and embolism: Secondary | ICD-10-CM | POA: Diagnosis not present

## 2016-01-18 DIAGNOSIS — Z87440 Personal history of urinary (tract) infections: Secondary | ICD-10-CM | POA: Diagnosis not present

## 2016-01-18 DIAGNOSIS — C3490 Malignant neoplasm of unspecified part of unspecified bronchus or lung: Secondary | ICD-10-CM | POA: Diagnosis not present

## 2016-01-19 DIAGNOSIS — M7061 Trochanteric bursitis, right hip: Secondary | ICD-10-CM | POA: Diagnosis not present

## 2016-01-19 DIAGNOSIS — R000 Tachycardia, unspecified: Secondary | ICD-10-CM | POA: Diagnosis not present

## 2016-01-19 DIAGNOSIS — N183 Chronic kidney disease, stage 3 (moderate): Secondary | ICD-10-CM | POA: Diagnosis not present

## 2016-01-19 DIAGNOSIS — Z86718 Personal history of other venous thrombosis and embolism: Secondary | ICD-10-CM | POA: Diagnosis not present

## 2016-01-19 DIAGNOSIS — L89154 Pressure ulcer of sacral region, stage 4: Secondary | ICD-10-CM | POA: Diagnosis not present

## 2016-01-19 DIAGNOSIS — Z466 Encounter for fitting and adjustment of urinary device: Secondary | ICD-10-CM | POA: Diagnosis not present

## 2016-01-19 DIAGNOSIS — G8311 Monoplegia of lower limb affecting right dominant side: Secondary | ICD-10-CM | POA: Diagnosis not present

## 2016-01-19 DIAGNOSIS — I2510 Atherosclerotic heart disease of native coronary artery without angina pectoris: Secondary | ICD-10-CM | POA: Diagnosis not present

## 2016-01-19 DIAGNOSIS — I5032 Chronic diastolic (congestive) heart failure: Secondary | ICD-10-CM | POA: Diagnosis not present

## 2016-01-19 DIAGNOSIS — I130 Hypertensive heart and chronic kidney disease with heart failure and stage 1 through stage 4 chronic kidney disease, or unspecified chronic kidney disease: Secondary | ICD-10-CM | POA: Diagnosis not present

## 2016-01-19 DIAGNOSIS — D0220 Carcinoma in situ of unspecified bronchus and lung: Secondary | ICD-10-CM | POA: Diagnosis not present

## 2016-01-19 DIAGNOSIS — E46 Unspecified protein-calorie malnutrition: Secondary | ICD-10-CM | POA: Diagnosis not present

## 2016-01-19 DIAGNOSIS — C3490 Malignant neoplasm of unspecified part of unspecified bronchus or lung: Secondary | ICD-10-CM | POA: Diagnosis not present

## 2016-01-19 DIAGNOSIS — Z87440 Personal history of urinary (tract) infections: Secondary | ICD-10-CM | POA: Diagnosis not present

## 2016-01-19 DIAGNOSIS — C7951 Secondary malignant neoplasm of bone: Secondary | ICD-10-CM | POA: Diagnosis not present

## 2016-01-21 DIAGNOSIS — R222 Localized swelling, mass and lump, trunk: Secondary | ICD-10-CM | POA: Diagnosis not present

## 2016-01-21 DIAGNOSIS — L89154 Pressure ulcer of sacral region, stage 4: Secondary | ICD-10-CM | POA: Diagnosis not present

## 2016-01-22 DIAGNOSIS — N183 Chronic kidney disease, stage 3 (moderate): Secondary | ICD-10-CM | POA: Diagnosis not present

## 2016-01-22 DIAGNOSIS — G8311 Monoplegia of lower limb affecting right dominant side: Secondary | ICD-10-CM | POA: Diagnosis not present

## 2016-01-22 DIAGNOSIS — Z466 Encounter for fitting and adjustment of urinary device: Secondary | ICD-10-CM | POA: Diagnosis not present

## 2016-01-22 DIAGNOSIS — C3490 Malignant neoplasm of unspecified part of unspecified bronchus or lung: Secondary | ICD-10-CM | POA: Diagnosis not present

## 2016-01-22 DIAGNOSIS — I2510 Atherosclerotic heart disease of native coronary artery without angina pectoris: Secondary | ICD-10-CM | POA: Diagnosis not present

## 2016-01-22 DIAGNOSIS — I5032 Chronic diastolic (congestive) heart failure: Secondary | ICD-10-CM | POA: Diagnosis not present

## 2016-01-22 DIAGNOSIS — Z86718 Personal history of other venous thrombosis and embolism: Secondary | ICD-10-CM | POA: Diagnosis not present

## 2016-01-22 DIAGNOSIS — L89154 Pressure ulcer of sacral region, stage 4: Secondary | ICD-10-CM | POA: Diagnosis not present

## 2016-01-22 DIAGNOSIS — Z87440 Personal history of urinary (tract) infections: Secondary | ICD-10-CM | POA: Diagnosis not present

## 2016-01-22 DIAGNOSIS — I130 Hypertensive heart and chronic kidney disease with heart failure and stage 1 through stage 4 chronic kidney disease, or unspecified chronic kidney disease: Secondary | ICD-10-CM | POA: Diagnosis not present

## 2016-01-22 DIAGNOSIS — C7951 Secondary malignant neoplasm of bone: Secondary | ICD-10-CM | POA: Diagnosis not present

## 2016-01-23 DIAGNOSIS — Z86718 Personal history of other venous thrombosis and embolism: Secondary | ICD-10-CM | POA: Diagnosis not present

## 2016-01-23 DIAGNOSIS — C7951 Secondary malignant neoplasm of bone: Secondary | ICD-10-CM | POA: Diagnosis not present

## 2016-01-23 DIAGNOSIS — Z466 Encounter for fitting and adjustment of urinary device: Secondary | ICD-10-CM | POA: Diagnosis not present

## 2016-01-23 DIAGNOSIS — N183 Chronic kidney disease, stage 3 (moderate): Secondary | ICD-10-CM | POA: Diagnosis not present

## 2016-01-23 DIAGNOSIS — I2510 Atherosclerotic heart disease of native coronary artery without angina pectoris: Secondary | ICD-10-CM | POA: Diagnosis not present

## 2016-01-23 DIAGNOSIS — G8311 Monoplegia of lower limb affecting right dominant side: Secondary | ICD-10-CM | POA: Diagnosis not present

## 2016-01-23 DIAGNOSIS — I5032 Chronic diastolic (congestive) heart failure: Secondary | ICD-10-CM | POA: Diagnosis not present

## 2016-01-23 DIAGNOSIS — L89154 Pressure ulcer of sacral region, stage 4: Secondary | ICD-10-CM | POA: Diagnosis not present

## 2016-01-23 DIAGNOSIS — Z87440 Personal history of urinary (tract) infections: Secondary | ICD-10-CM | POA: Diagnosis not present

## 2016-01-23 DIAGNOSIS — I130 Hypertensive heart and chronic kidney disease with heart failure and stage 1 through stage 4 chronic kidney disease, or unspecified chronic kidney disease: Secondary | ICD-10-CM | POA: Diagnosis not present

## 2016-01-23 DIAGNOSIS — C3490 Malignant neoplasm of unspecified part of unspecified bronchus or lung: Secondary | ICD-10-CM | POA: Diagnosis not present

## 2016-01-24 DIAGNOSIS — Z86718 Personal history of other venous thrombosis and embolism: Secondary | ICD-10-CM | POA: Diagnosis not present

## 2016-01-24 DIAGNOSIS — Z87440 Personal history of urinary (tract) infections: Secondary | ICD-10-CM | POA: Diagnosis not present

## 2016-01-24 DIAGNOSIS — Z466 Encounter for fitting and adjustment of urinary device: Secondary | ICD-10-CM | POA: Diagnosis not present

## 2016-01-24 DIAGNOSIS — C7951 Secondary malignant neoplasm of bone: Secondary | ICD-10-CM | POA: Diagnosis not present

## 2016-01-24 DIAGNOSIS — N183 Chronic kidney disease, stage 3 (moderate): Secondary | ICD-10-CM | POA: Diagnosis not present

## 2016-01-24 DIAGNOSIS — I130 Hypertensive heart and chronic kidney disease with heart failure and stage 1 through stage 4 chronic kidney disease, or unspecified chronic kidney disease: Secondary | ICD-10-CM | POA: Diagnosis not present

## 2016-01-24 DIAGNOSIS — G8311 Monoplegia of lower limb affecting right dominant side: Secondary | ICD-10-CM | POA: Diagnosis not present

## 2016-01-24 DIAGNOSIS — I5032 Chronic diastolic (congestive) heart failure: Secondary | ICD-10-CM | POA: Diagnosis not present

## 2016-01-24 DIAGNOSIS — I2510 Atherosclerotic heart disease of native coronary artery without angina pectoris: Secondary | ICD-10-CM | POA: Diagnosis not present

## 2016-01-24 DIAGNOSIS — L89154 Pressure ulcer of sacral region, stage 4: Secondary | ICD-10-CM | POA: Diagnosis not present

## 2016-01-24 DIAGNOSIS — C3490 Malignant neoplasm of unspecified part of unspecified bronchus or lung: Secondary | ICD-10-CM | POA: Diagnosis not present

## 2016-01-25 DIAGNOSIS — L89154 Pressure ulcer of sacral region, stage 4: Secondary | ICD-10-CM | POA: Diagnosis not present

## 2016-01-25 DIAGNOSIS — Z86718 Personal history of other venous thrombosis and embolism: Secondary | ICD-10-CM | POA: Diagnosis not present

## 2016-01-25 DIAGNOSIS — I130 Hypertensive heart and chronic kidney disease with heart failure and stage 1 through stage 4 chronic kidney disease, or unspecified chronic kidney disease: Secondary | ICD-10-CM | POA: Diagnosis not present

## 2016-01-25 DIAGNOSIS — G8311 Monoplegia of lower limb affecting right dominant side: Secondary | ICD-10-CM | POA: Diagnosis not present

## 2016-01-25 DIAGNOSIS — Z466 Encounter for fitting and adjustment of urinary device: Secondary | ICD-10-CM | POA: Diagnosis not present

## 2016-01-25 DIAGNOSIS — C7951 Secondary malignant neoplasm of bone: Secondary | ICD-10-CM | POA: Diagnosis not present

## 2016-01-25 DIAGNOSIS — C3490 Malignant neoplasm of unspecified part of unspecified bronchus or lung: Secondary | ICD-10-CM | POA: Diagnosis not present

## 2016-01-25 DIAGNOSIS — Z87440 Personal history of urinary (tract) infections: Secondary | ICD-10-CM | POA: Diagnosis not present

## 2016-01-25 DIAGNOSIS — I2510 Atherosclerotic heart disease of native coronary artery without angina pectoris: Secondary | ICD-10-CM | POA: Diagnosis not present

## 2016-01-25 DIAGNOSIS — N183 Chronic kidney disease, stage 3 (moderate): Secondary | ICD-10-CM | POA: Diagnosis not present

## 2016-01-25 DIAGNOSIS — I5032 Chronic diastolic (congestive) heart failure: Secondary | ICD-10-CM | POA: Diagnosis not present

## 2016-01-26 DIAGNOSIS — Z466 Encounter for fitting and adjustment of urinary device: Secondary | ICD-10-CM | POA: Diagnosis not present

## 2016-01-26 DIAGNOSIS — C3490 Malignant neoplasm of unspecified part of unspecified bronchus or lung: Secondary | ICD-10-CM | POA: Diagnosis not present

## 2016-01-26 DIAGNOSIS — Z87440 Personal history of urinary (tract) infections: Secondary | ICD-10-CM | POA: Diagnosis not present

## 2016-01-26 DIAGNOSIS — N183 Chronic kidney disease, stage 3 (moderate): Secondary | ICD-10-CM | POA: Diagnosis not present

## 2016-01-26 DIAGNOSIS — I5032 Chronic diastolic (congestive) heart failure: Secondary | ICD-10-CM | POA: Diagnosis not present

## 2016-01-26 DIAGNOSIS — G8311 Monoplegia of lower limb affecting right dominant side: Secondary | ICD-10-CM | POA: Diagnosis not present

## 2016-01-26 DIAGNOSIS — I130 Hypertensive heart and chronic kidney disease with heart failure and stage 1 through stage 4 chronic kidney disease, or unspecified chronic kidney disease: Secondary | ICD-10-CM | POA: Diagnosis not present

## 2016-01-26 DIAGNOSIS — I2510 Atherosclerotic heart disease of native coronary artery without angina pectoris: Secondary | ICD-10-CM | POA: Diagnosis not present

## 2016-01-26 DIAGNOSIS — C7951 Secondary malignant neoplasm of bone: Secondary | ICD-10-CM | POA: Diagnosis not present

## 2016-01-26 DIAGNOSIS — Z86718 Personal history of other venous thrombosis and embolism: Secondary | ICD-10-CM | POA: Diagnosis not present

## 2016-01-26 DIAGNOSIS — L89154 Pressure ulcer of sacral region, stage 4: Secondary | ICD-10-CM | POA: Diagnosis not present

## 2016-01-30 DIAGNOSIS — Z87440 Personal history of urinary (tract) infections: Secondary | ICD-10-CM | POA: Diagnosis not present

## 2016-01-30 DIAGNOSIS — N183 Chronic kidney disease, stage 3 (moderate): Secondary | ICD-10-CM | POA: Diagnosis not present

## 2016-01-30 DIAGNOSIS — I2510 Atherosclerotic heart disease of native coronary artery without angina pectoris: Secondary | ICD-10-CM | POA: Diagnosis not present

## 2016-01-30 DIAGNOSIS — C7951 Secondary malignant neoplasm of bone: Secondary | ICD-10-CM | POA: Diagnosis not present

## 2016-01-30 DIAGNOSIS — C3490 Malignant neoplasm of unspecified part of unspecified bronchus or lung: Secondary | ICD-10-CM | POA: Diagnosis not present

## 2016-01-30 DIAGNOSIS — I5032 Chronic diastolic (congestive) heart failure: Secondary | ICD-10-CM | POA: Diagnosis not present

## 2016-01-30 DIAGNOSIS — I130 Hypertensive heart and chronic kidney disease with heart failure and stage 1 through stage 4 chronic kidney disease, or unspecified chronic kidney disease: Secondary | ICD-10-CM | POA: Diagnosis not present

## 2016-01-30 DIAGNOSIS — Z86718 Personal history of other venous thrombosis and embolism: Secondary | ICD-10-CM | POA: Diagnosis not present

## 2016-01-30 DIAGNOSIS — Z466 Encounter for fitting and adjustment of urinary device: Secondary | ICD-10-CM | POA: Diagnosis not present

## 2016-01-30 DIAGNOSIS — G8311 Monoplegia of lower limb affecting right dominant side: Secondary | ICD-10-CM | POA: Diagnosis not present

## 2016-01-30 DIAGNOSIS — L89154 Pressure ulcer of sacral region, stage 4: Secondary | ICD-10-CM | POA: Diagnosis not present

## 2016-01-31 DIAGNOSIS — C3490 Malignant neoplasm of unspecified part of unspecified bronchus or lung: Secondary | ICD-10-CM | POA: Diagnosis not present

## 2016-01-31 DIAGNOSIS — I130 Hypertensive heart and chronic kidney disease with heart failure and stage 1 through stage 4 chronic kidney disease, or unspecified chronic kidney disease: Secondary | ICD-10-CM | POA: Diagnosis not present

## 2016-01-31 DIAGNOSIS — C7951 Secondary malignant neoplasm of bone: Secondary | ICD-10-CM | POA: Diagnosis not present

## 2016-01-31 DIAGNOSIS — G8311 Monoplegia of lower limb affecting right dominant side: Secondary | ICD-10-CM | POA: Diagnosis not present

## 2016-01-31 DIAGNOSIS — Z87440 Personal history of urinary (tract) infections: Secondary | ICD-10-CM | POA: Diagnosis not present

## 2016-01-31 DIAGNOSIS — L89154 Pressure ulcer of sacral region, stage 4: Secondary | ICD-10-CM | POA: Diagnosis not present

## 2016-01-31 DIAGNOSIS — Z86718 Personal history of other venous thrombosis and embolism: Secondary | ICD-10-CM | POA: Diagnosis not present

## 2016-01-31 DIAGNOSIS — Z466 Encounter for fitting and adjustment of urinary device: Secondary | ICD-10-CM | POA: Diagnosis not present

## 2016-01-31 DIAGNOSIS — I5032 Chronic diastolic (congestive) heart failure: Secondary | ICD-10-CM | POA: Diagnosis not present

## 2016-01-31 DIAGNOSIS — N183 Chronic kidney disease, stage 3 (moderate): Secondary | ICD-10-CM | POA: Diagnosis not present

## 2016-01-31 DIAGNOSIS — I2510 Atherosclerotic heart disease of native coronary artery without angina pectoris: Secondary | ICD-10-CM | POA: Diagnosis not present

## 2016-02-02 DIAGNOSIS — N183 Chronic kidney disease, stage 3 (moderate): Secondary | ICD-10-CM | POA: Diagnosis not present

## 2016-02-02 DIAGNOSIS — Z86718 Personal history of other venous thrombosis and embolism: Secondary | ICD-10-CM | POA: Diagnosis not present

## 2016-02-02 DIAGNOSIS — L89154 Pressure ulcer of sacral region, stage 4: Secondary | ICD-10-CM | POA: Diagnosis not present

## 2016-02-02 DIAGNOSIS — I5032 Chronic diastolic (congestive) heart failure: Secondary | ICD-10-CM | POA: Diagnosis not present

## 2016-02-02 DIAGNOSIS — G8311 Monoplegia of lower limb affecting right dominant side: Secondary | ICD-10-CM | POA: Diagnosis not present

## 2016-02-02 DIAGNOSIS — Z466 Encounter for fitting and adjustment of urinary device: Secondary | ICD-10-CM | POA: Diagnosis not present

## 2016-02-02 DIAGNOSIS — Z87440 Personal history of urinary (tract) infections: Secondary | ICD-10-CM | POA: Diagnosis not present

## 2016-02-02 DIAGNOSIS — C3490 Malignant neoplasm of unspecified part of unspecified bronchus or lung: Secondary | ICD-10-CM | POA: Diagnosis not present

## 2016-02-02 DIAGNOSIS — C7951 Secondary malignant neoplasm of bone: Secondary | ICD-10-CM | POA: Diagnosis not present

## 2016-02-02 DIAGNOSIS — I130 Hypertensive heart and chronic kidney disease with heart failure and stage 1 through stage 4 chronic kidney disease, or unspecified chronic kidney disease: Secondary | ICD-10-CM | POA: Diagnosis not present

## 2016-02-02 DIAGNOSIS — I2510 Atherosclerotic heart disease of native coronary artery without angina pectoris: Secondary | ICD-10-CM | POA: Diagnosis not present

## 2016-02-06 DIAGNOSIS — I509 Heart failure, unspecified: Secondary | ICD-10-CM | POA: Diagnosis not present

## 2016-02-06 DIAGNOSIS — L89153 Pressure ulcer of sacral region, stage 3: Secondary | ICD-10-CM | POA: Diagnosis not present

## 2016-02-06 DIAGNOSIS — D0220 Carcinoma in situ of unspecified bronchus and lung: Secondary | ICD-10-CM | POA: Diagnosis not present

## 2016-02-06 DIAGNOSIS — I8291 Chronic embolism and thrombosis of unspecified vein: Secondary | ICD-10-CM | POA: Diagnosis not present

## 2016-02-07 ENCOUNTER — Ambulatory Visit: Payer: Medicare Other

## 2016-02-07 DIAGNOSIS — Z86718 Personal history of other venous thrombosis and embolism: Secondary | ICD-10-CM | POA: Diagnosis not present

## 2016-02-07 DIAGNOSIS — L89154 Pressure ulcer of sacral region, stage 4: Secondary | ICD-10-CM | POA: Diagnosis not present

## 2016-02-07 DIAGNOSIS — I2510 Atherosclerotic heart disease of native coronary artery without angina pectoris: Secondary | ICD-10-CM | POA: Diagnosis not present

## 2016-02-07 DIAGNOSIS — I130 Hypertensive heart and chronic kidney disease with heart failure and stage 1 through stage 4 chronic kidney disease, or unspecified chronic kidney disease: Secondary | ICD-10-CM | POA: Diagnosis not present

## 2016-02-07 DIAGNOSIS — C3490 Malignant neoplasm of unspecified part of unspecified bronchus or lung: Secondary | ICD-10-CM | POA: Diagnosis not present

## 2016-02-07 DIAGNOSIS — C7951 Secondary malignant neoplasm of bone: Secondary | ICD-10-CM | POA: Diagnosis not present

## 2016-02-07 DIAGNOSIS — Z466 Encounter for fitting and adjustment of urinary device: Secondary | ICD-10-CM | POA: Diagnosis not present

## 2016-02-07 DIAGNOSIS — G8311 Monoplegia of lower limb affecting right dominant side: Secondary | ICD-10-CM | POA: Diagnosis not present

## 2016-02-07 DIAGNOSIS — N183 Chronic kidney disease, stage 3 (moderate): Secondary | ICD-10-CM | POA: Diagnosis not present

## 2016-02-07 DIAGNOSIS — Z87440 Personal history of urinary (tract) infections: Secondary | ICD-10-CM | POA: Diagnosis not present

## 2016-02-07 DIAGNOSIS — I5032 Chronic diastolic (congestive) heart failure: Secondary | ICD-10-CM | POA: Diagnosis not present

## 2016-02-08 DIAGNOSIS — I5032 Chronic diastolic (congestive) heart failure: Secondary | ICD-10-CM | POA: Diagnosis not present

## 2016-02-08 DIAGNOSIS — G8311 Monoplegia of lower limb affecting right dominant side: Secondary | ICD-10-CM | POA: Diagnosis not present

## 2016-02-08 DIAGNOSIS — C7951 Secondary malignant neoplasm of bone: Secondary | ICD-10-CM | POA: Diagnosis not present

## 2016-02-08 DIAGNOSIS — I2510 Atherosclerotic heart disease of native coronary artery without angina pectoris: Secondary | ICD-10-CM | POA: Diagnosis not present

## 2016-02-08 DIAGNOSIS — Z86718 Personal history of other venous thrombosis and embolism: Secondary | ICD-10-CM | POA: Diagnosis not present

## 2016-02-08 DIAGNOSIS — Z466 Encounter for fitting and adjustment of urinary device: Secondary | ICD-10-CM | POA: Diagnosis not present

## 2016-02-08 DIAGNOSIS — L89154 Pressure ulcer of sacral region, stage 4: Secondary | ICD-10-CM | POA: Diagnosis not present

## 2016-02-08 DIAGNOSIS — N183 Chronic kidney disease, stage 3 (moderate): Secondary | ICD-10-CM | POA: Diagnosis not present

## 2016-02-08 DIAGNOSIS — Z87440 Personal history of urinary (tract) infections: Secondary | ICD-10-CM | POA: Diagnosis not present

## 2016-02-08 DIAGNOSIS — I130 Hypertensive heart and chronic kidney disease with heart failure and stage 1 through stage 4 chronic kidney disease, or unspecified chronic kidney disease: Secondary | ICD-10-CM | POA: Diagnosis not present

## 2016-02-08 DIAGNOSIS — C3490 Malignant neoplasm of unspecified part of unspecified bronchus or lung: Secondary | ICD-10-CM | POA: Diagnosis not present

## 2016-02-09 DIAGNOSIS — C3490 Malignant neoplasm of unspecified part of unspecified bronchus or lung: Secondary | ICD-10-CM | POA: Diagnosis not present

## 2016-02-09 DIAGNOSIS — G8311 Monoplegia of lower limb affecting right dominant side: Secondary | ICD-10-CM | POA: Diagnosis not present

## 2016-02-09 DIAGNOSIS — N183 Chronic kidney disease, stage 3 (moderate): Secondary | ICD-10-CM | POA: Diagnosis not present

## 2016-02-09 DIAGNOSIS — Z466 Encounter for fitting and adjustment of urinary device: Secondary | ICD-10-CM | POA: Diagnosis not present

## 2016-02-09 DIAGNOSIS — I5032 Chronic diastolic (congestive) heart failure: Secondary | ICD-10-CM | POA: Diagnosis not present

## 2016-02-09 DIAGNOSIS — L89154 Pressure ulcer of sacral region, stage 4: Secondary | ICD-10-CM | POA: Diagnosis not present

## 2016-02-09 DIAGNOSIS — C7951 Secondary malignant neoplasm of bone: Secondary | ICD-10-CM | POA: Diagnosis not present

## 2016-02-09 DIAGNOSIS — Z86718 Personal history of other venous thrombosis and embolism: Secondary | ICD-10-CM | POA: Diagnosis not present

## 2016-02-09 DIAGNOSIS — I130 Hypertensive heart and chronic kidney disease with heart failure and stage 1 through stage 4 chronic kidney disease, or unspecified chronic kidney disease: Secondary | ICD-10-CM | POA: Diagnosis not present

## 2016-02-09 DIAGNOSIS — I2510 Atherosclerotic heart disease of native coronary artery without angina pectoris: Secondary | ICD-10-CM | POA: Diagnosis not present

## 2016-02-09 DIAGNOSIS — Z87440 Personal history of urinary (tract) infections: Secondary | ICD-10-CM | POA: Diagnosis not present

## 2016-02-13 ENCOUNTER — Ambulatory Visit: Payer: Medicare Other | Admitting: Urology

## 2016-02-13 DIAGNOSIS — L89154 Pressure ulcer of sacral region, stage 4: Secondary | ICD-10-CM | POA: Diagnosis not present

## 2016-02-13 DIAGNOSIS — I130 Hypertensive heart and chronic kidney disease with heart failure and stage 1 through stage 4 chronic kidney disease, or unspecified chronic kidney disease: Secondary | ICD-10-CM | POA: Diagnosis not present

## 2016-02-13 DIAGNOSIS — C7951 Secondary malignant neoplasm of bone: Secondary | ICD-10-CM | POA: Diagnosis not present

## 2016-02-13 DIAGNOSIS — N183 Chronic kidney disease, stage 3 (moderate): Secondary | ICD-10-CM | POA: Diagnosis not present

## 2016-02-13 DIAGNOSIS — I5032 Chronic diastolic (congestive) heart failure: Secondary | ICD-10-CM | POA: Diagnosis not present

## 2016-02-13 DIAGNOSIS — Z87440 Personal history of urinary (tract) infections: Secondary | ICD-10-CM | POA: Diagnosis not present

## 2016-02-13 DIAGNOSIS — C3490 Malignant neoplasm of unspecified part of unspecified bronchus or lung: Secondary | ICD-10-CM | POA: Diagnosis not present

## 2016-02-13 DIAGNOSIS — Z86718 Personal history of other venous thrombosis and embolism: Secondary | ICD-10-CM | POA: Diagnosis not present

## 2016-02-13 DIAGNOSIS — Z466 Encounter for fitting and adjustment of urinary device: Secondary | ICD-10-CM | POA: Diagnosis not present

## 2016-02-13 DIAGNOSIS — I2510 Atherosclerotic heart disease of native coronary artery without angina pectoris: Secondary | ICD-10-CM | POA: Diagnosis not present

## 2016-02-13 DIAGNOSIS — G8311 Monoplegia of lower limb affecting right dominant side: Secondary | ICD-10-CM | POA: Diagnosis not present

## 2016-02-14 DIAGNOSIS — C3490 Malignant neoplasm of unspecified part of unspecified bronchus or lung: Secondary | ICD-10-CM | POA: Diagnosis not present

## 2016-02-14 DIAGNOSIS — L89154 Pressure ulcer of sacral region, stage 4: Secondary | ICD-10-CM | POA: Diagnosis not present

## 2016-02-14 DIAGNOSIS — Z86718 Personal history of other venous thrombosis and embolism: Secondary | ICD-10-CM | POA: Diagnosis not present

## 2016-02-14 DIAGNOSIS — Z466 Encounter for fitting and adjustment of urinary device: Secondary | ICD-10-CM | POA: Diagnosis not present

## 2016-02-14 DIAGNOSIS — I130 Hypertensive heart and chronic kidney disease with heart failure and stage 1 through stage 4 chronic kidney disease, or unspecified chronic kidney disease: Secondary | ICD-10-CM | POA: Diagnosis not present

## 2016-02-14 DIAGNOSIS — C7951 Secondary malignant neoplasm of bone: Secondary | ICD-10-CM | POA: Diagnosis not present

## 2016-02-14 DIAGNOSIS — I5032 Chronic diastolic (congestive) heart failure: Secondary | ICD-10-CM | POA: Diagnosis not present

## 2016-02-14 DIAGNOSIS — G8311 Monoplegia of lower limb affecting right dominant side: Secondary | ICD-10-CM | POA: Diagnosis not present

## 2016-02-14 DIAGNOSIS — N183 Chronic kidney disease, stage 3 (moderate): Secondary | ICD-10-CM | POA: Diagnosis not present

## 2016-02-14 DIAGNOSIS — Z87440 Personal history of urinary (tract) infections: Secondary | ICD-10-CM | POA: Diagnosis not present

## 2016-02-14 DIAGNOSIS — I2510 Atherosclerotic heart disease of native coronary artery without angina pectoris: Secondary | ICD-10-CM | POA: Diagnosis not present

## 2016-02-16 DIAGNOSIS — G8311 Monoplegia of lower limb affecting right dominant side: Secondary | ICD-10-CM | POA: Diagnosis not present

## 2016-02-16 DIAGNOSIS — Z466 Encounter for fitting and adjustment of urinary device: Secondary | ICD-10-CM | POA: Diagnosis not present

## 2016-02-16 DIAGNOSIS — I2510 Atherosclerotic heart disease of native coronary artery without angina pectoris: Secondary | ICD-10-CM | POA: Diagnosis not present

## 2016-02-16 DIAGNOSIS — N183 Chronic kidney disease, stage 3 (moderate): Secondary | ICD-10-CM | POA: Diagnosis not present

## 2016-02-16 DIAGNOSIS — L89154 Pressure ulcer of sacral region, stage 4: Secondary | ICD-10-CM | POA: Diagnosis not present

## 2016-02-16 DIAGNOSIS — Z87440 Personal history of urinary (tract) infections: Secondary | ICD-10-CM | POA: Diagnosis not present

## 2016-02-16 DIAGNOSIS — C7951 Secondary malignant neoplasm of bone: Secondary | ICD-10-CM | POA: Diagnosis not present

## 2016-02-16 DIAGNOSIS — I130 Hypertensive heart and chronic kidney disease with heart failure and stage 1 through stage 4 chronic kidney disease, or unspecified chronic kidney disease: Secondary | ICD-10-CM | POA: Diagnosis not present

## 2016-02-16 DIAGNOSIS — C3490 Malignant neoplasm of unspecified part of unspecified bronchus or lung: Secondary | ICD-10-CM | POA: Diagnosis not present

## 2016-02-16 DIAGNOSIS — Z86718 Personal history of other venous thrombosis and embolism: Secondary | ICD-10-CM | POA: Diagnosis not present

## 2016-02-16 DIAGNOSIS — I5032 Chronic diastolic (congestive) heart failure: Secondary | ICD-10-CM | POA: Diagnosis not present

## 2016-02-20 DIAGNOSIS — G8311 Monoplegia of lower limb affecting right dominant side: Secondary | ICD-10-CM | POA: Diagnosis not present

## 2016-02-20 DIAGNOSIS — Z86718 Personal history of other venous thrombosis and embolism: Secondary | ICD-10-CM | POA: Diagnosis not present

## 2016-02-20 DIAGNOSIS — N183 Chronic kidney disease, stage 3 (moderate): Secondary | ICD-10-CM | POA: Diagnosis not present

## 2016-02-20 DIAGNOSIS — Z466 Encounter for fitting and adjustment of urinary device: Secondary | ICD-10-CM | POA: Diagnosis not present

## 2016-02-20 DIAGNOSIS — Z87440 Personal history of urinary (tract) infections: Secondary | ICD-10-CM | POA: Diagnosis not present

## 2016-02-20 DIAGNOSIS — I2510 Atherosclerotic heart disease of native coronary artery without angina pectoris: Secondary | ICD-10-CM | POA: Diagnosis not present

## 2016-02-20 DIAGNOSIS — L89154 Pressure ulcer of sacral region, stage 4: Secondary | ICD-10-CM | POA: Diagnosis not present

## 2016-02-20 DIAGNOSIS — C7951 Secondary malignant neoplasm of bone: Secondary | ICD-10-CM | POA: Diagnosis not present

## 2016-02-20 DIAGNOSIS — I130 Hypertensive heart and chronic kidney disease with heart failure and stage 1 through stage 4 chronic kidney disease, or unspecified chronic kidney disease: Secondary | ICD-10-CM | POA: Diagnosis not present

## 2016-02-20 DIAGNOSIS — C3490 Malignant neoplasm of unspecified part of unspecified bronchus or lung: Secondary | ICD-10-CM | POA: Diagnosis not present

## 2016-02-20 DIAGNOSIS — I5032 Chronic diastolic (congestive) heart failure: Secondary | ICD-10-CM | POA: Diagnosis not present

## 2016-02-21 DIAGNOSIS — L89154 Pressure ulcer of sacral region, stage 4: Secondary | ICD-10-CM | POA: Diagnosis not present

## 2016-02-21 DIAGNOSIS — R222 Localized swelling, mass and lump, trunk: Secondary | ICD-10-CM | POA: Diagnosis not present

## 2016-02-22 DIAGNOSIS — I5032 Chronic diastolic (congestive) heart failure: Secondary | ICD-10-CM | POA: Diagnosis not present

## 2016-02-22 DIAGNOSIS — G8311 Monoplegia of lower limb affecting right dominant side: Secondary | ICD-10-CM | POA: Diagnosis not present

## 2016-02-22 DIAGNOSIS — Z86718 Personal history of other venous thrombosis and embolism: Secondary | ICD-10-CM | POA: Diagnosis not present

## 2016-02-22 DIAGNOSIS — I2510 Atherosclerotic heart disease of native coronary artery without angina pectoris: Secondary | ICD-10-CM | POA: Diagnosis not present

## 2016-02-22 DIAGNOSIS — L89154 Pressure ulcer of sacral region, stage 4: Secondary | ICD-10-CM | POA: Diagnosis not present

## 2016-02-22 DIAGNOSIS — Z87440 Personal history of urinary (tract) infections: Secondary | ICD-10-CM | POA: Diagnosis not present

## 2016-02-22 DIAGNOSIS — N183 Chronic kidney disease, stage 3 (moderate): Secondary | ICD-10-CM | POA: Diagnosis not present

## 2016-02-22 DIAGNOSIS — Z466 Encounter for fitting and adjustment of urinary device: Secondary | ICD-10-CM | POA: Diagnosis not present

## 2016-02-22 DIAGNOSIS — C7951 Secondary malignant neoplasm of bone: Secondary | ICD-10-CM | POA: Diagnosis not present

## 2016-02-22 DIAGNOSIS — I130 Hypertensive heart and chronic kidney disease with heart failure and stage 1 through stage 4 chronic kidney disease, or unspecified chronic kidney disease: Secondary | ICD-10-CM | POA: Diagnosis not present

## 2016-02-22 DIAGNOSIS — C3490 Malignant neoplasm of unspecified part of unspecified bronchus or lung: Secondary | ICD-10-CM | POA: Diagnosis not present

## 2016-02-23 DIAGNOSIS — R0602 Shortness of breath: Secondary | ICD-10-CM | POA: Diagnosis not present

## 2016-02-23 DIAGNOSIS — L89159 Pressure ulcer of sacral region, unspecified stage: Secondary | ICD-10-CM | POA: Diagnosis not present

## 2016-02-23 DIAGNOSIS — M25551 Pain in right hip: Secondary | ICD-10-CM | POA: Diagnosis not present

## 2016-02-23 DIAGNOSIS — I519 Heart disease, unspecified: Secondary | ICD-10-CM | POA: Diagnosis not present

## 2016-02-23 DIAGNOSIS — D0220 Carcinoma in situ of unspecified bronchus and lung: Secondary | ICD-10-CM | POA: Diagnosis not present

## 2016-02-26 DIAGNOSIS — L89154 Pressure ulcer of sacral region, stage 4: Secondary | ICD-10-CM | POA: Diagnosis not present

## 2016-02-26 DIAGNOSIS — Z466 Encounter for fitting and adjustment of urinary device: Secondary | ICD-10-CM | POA: Diagnosis not present

## 2016-02-26 DIAGNOSIS — I2510 Atherosclerotic heart disease of native coronary artery without angina pectoris: Secondary | ICD-10-CM | POA: Diagnosis not present

## 2016-02-26 DIAGNOSIS — Z87440 Personal history of urinary (tract) infections: Secondary | ICD-10-CM | POA: Diagnosis not present

## 2016-02-26 DIAGNOSIS — I130 Hypertensive heart and chronic kidney disease with heart failure and stage 1 through stage 4 chronic kidney disease, or unspecified chronic kidney disease: Secondary | ICD-10-CM | POA: Diagnosis not present

## 2016-02-26 DIAGNOSIS — N183 Chronic kidney disease, stage 3 (moderate): Secondary | ICD-10-CM | POA: Diagnosis not present

## 2016-02-26 DIAGNOSIS — C3490 Malignant neoplasm of unspecified part of unspecified bronchus or lung: Secondary | ICD-10-CM | POA: Diagnosis not present

## 2016-02-26 DIAGNOSIS — Z86718 Personal history of other venous thrombosis and embolism: Secondary | ICD-10-CM | POA: Diagnosis not present

## 2016-02-26 DIAGNOSIS — G8311 Monoplegia of lower limb affecting right dominant side: Secondary | ICD-10-CM | POA: Diagnosis not present

## 2016-02-26 DIAGNOSIS — C7951 Secondary malignant neoplasm of bone: Secondary | ICD-10-CM | POA: Diagnosis not present

## 2016-02-26 DIAGNOSIS — I5032 Chronic diastolic (congestive) heart failure: Secondary | ICD-10-CM | POA: Diagnosis not present

## 2016-02-27 DIAGNOSIS — Z466 Encounter for fitting and adjustment of urinary device: Secondary | ICD-10-CM | POA: Diagnosis not present

## 2016-02-27 DIAGNOSIS — I5032 Chronic diastolic (congestive) heart failure: Secondary | ICD-10-CM | POA: Diagnosis not present

## 2016-02-27 DIAGNOSIS — I130 Hypertensive heart and chronic kidney disease with heart failure and stage 1 through stage 4 chronic kidney disease, or unspecified chronic kidney disease: Secondary | ICD-10-CM | POA: Diagnosis not present

## 2016-02-27 DIAGNOSIS — I2510 Atherosclerotic heart disease of native coronary artery without angina pectoris: Secondary | ICD-10-CM | POA: Diagnosis not present

## 2016-02-27 DIAGNOSIS — C3490 Malignant neoplasm of unspecified part of unspecified bronchus or lung: Secondary | ICD-10-CM | POA: Diagnosis not present

## 2016-02-27 DIAGNOSIS — G8311 Monoplegia of lower limb affecting right dominant side: Secondary | ICD-10-CM | POA: Diagnosis not present

## 2016-02-27 DIAGNOSIS — Z86718 Personal history of other venous thrombosis and embolism: Secondary | ICD-10-CM | POA: Diagnosis not present

## 2016-02-27 DIAGNOSIS — L89154 Pressure ulcer of sacral region, stage 4: Secondary | ICD-10-CM | POA: Diagnosis not present

## 2016-02-27 DIAGNOSIS — C7951 Secondary malignant neoplasm of bone: Secondary | ICD-10-CM | POA: Diagnosis not present

## 2016-02-27 DIAGNOSIS — Z87440 Personal history of urinary (tract) infections: Secondary | ICD-10-CM | POA: Diagnosis not present

## 2016-02-27 DIAGNOSIS — N183 Chronic kidney disease, stage 3 (moderate): Secondary | ICD-10-CM | POA: Diagnosis not present

## 2016-02-28 DIAGNOSIS — Z87440 Personal history of urinary (tract) infections: Secondary | ICD-10-CM | POA: Diagnosis not present

## 2016-02-28 DIAGNOSIS — C7951 Secondary malignant neoplasm of bone: Secondary | ICD-10-CM | POA: Diagnosis not present

## 2016-02-28 DIAGNOSIS — C3490 Malignant neoplasm of unspecified part of unspecified bronchus or lung: Secondary | ICD-10-CM | POA: Diagnosis not present

## 2016-02-28 DIAGNOSIS — I130 Hypertensive heart and chronic kidney disease with heart failure and stage 1 through stage 4 chronic kidney disease, or unspecified chronic kidney disease: Secondary | ICD-10-CM | POA: Diagnosis not present

## 2016-02-28 DIAGNOSIS — N183 Chronic kidney disease, stage 3 (moderate): Secondary | ICD-10-CM | POA: Diagnosis not present

## 2016-02-28 DIAGNOSIS — Z86718 Personal history of other venous thrombosis and embolism: Secondary | ICD-10-CM | POA: Diagnosis not present

## 2016-02-28 DIAGNOSIS — G8311 Monoplegia of lower limb affecting right dominant side: Secondary | ICD-10-CM | POA: Diagnosis not present

## 2016-02-28 DIAGNOSIS — L89154 Pressure ulcer of sacral region, stage 4: Secondary | ICD-10-CM | POA: Diagnosis not present

## 2016-02-28 DIAGNOSIS — Z466 Encounter for fitting and adjustment of urinary device: Secondary | ICD-10-CM | POA: Diagnosis not present

## 2016-02-28 DIAGNOSIS — I5032 Chronic diastolic (congestive) heart failure: Secondary | ICD-10-CM | POA: Diagnosis not present

## 2016-02-28 DIAGNOSIS — I2510 Atherosclerotic heart disease of native coronary artery without angina pectoris: Secondary | ICD-10-CM | POA: Diagnosis not present

## 2016-02-29 DIAGNOSIS — G8311 Monoplegia of lower limb affecting right dominant side: Secondary | ICD-10-CM | POA: Diagnosis not present

## 2016-02-29 DIAGNOSIS — C7951 Secondary malignant neoplasm of bone: Secondary | ICD-10-CM | POA: Diagnosis not present

## 2016-02-29 DIAGNOSIS — Z86718 Personal history of other venous thrombosis and embolism: Secondary | ICD-10-CM | POA: Diagnosis not present

## 2016-02-29 DIAGNOSIS — I5032 Chronic diastolic (congestive) heart failure: Secondary | ICD-10-CM | POA: Diagnosis not present

## 2016-02-29 DIAGNOSIS — Z87440 Personal history of urinary (tract) infections: Secondary | ICD-10-CM | POA: Diagnosis not present

## 2016-02-29 DIAGNOSIS — L89154 Pressure ulcer of sacral region, stage 4: Secondary | ICD-10-CM | POA: Diagnosis not present

## 2016-02-29 DIAGNOSIS — Z466 Encounter for fitting and adjustment of urinary device: Secondary | ICD-10-CM | POA: Diagnosis not present

## 2016-02-29 DIAGNOSIS — I2510 Atherosclerotic heart disease of native coronary artery without angina pectoris: Secondary | ICD-10-CM | POA: Diagnosis not present

## 2016-02-29 DIAGNOSIS — N183 Chronic kidney disease, stage 3 (moderate): Secondary | ICD-10-CM | POA: Diagnosis not present

## 2016-02-29 DIAGNOSIS — C3490 Malignant neoplasm of unspecified part of unspecified bronchus or lung: Secondary | ICD-10-CM | POA: Diagnosis not present

## 2016-02-29 DIAGNOSIS — I130 Hypertensive heart and chronic kidney disease with heart failure and stage 1 through stage 4 chronic kidney disease, or unspecified chronic kidney disease: Secondary | ICD-10-CM | POA: Diagnosis not present

## 2016-03-01 DIAGNOSIS — I2510 Atherosclerotic heart disease of native coronary artery without angina pectoris: Secondary | ICD-10-CM | POA: Diagnosis not present

## 2016-03-01 DIAGNOSIS — I130 Hypertensive heart and chronic kidney disease with heart failure and stage 1 through stage 4 chronic kidney disease, or unspecified chronic kidney disease: Secondary | ICD-10-CM | POA: Diagnosis not present

## 2016-03-01 DIAGNOSIS — C3490 Malignant neoplasm of unspecified part of unspecified bronchus or lung: Secondary | ICD-10-CM | POA: Diagnosis not present

## 2016-03-01 DIAGNOSIS — Z466 Encounter for fitting and adjustment of urinary device: Secondary | ICD-10-CM | POA: Diagnosis not present

## 2016-03-01 DIAGNOSIS — Z87440 Personal history of urinary (tract) infections: Secondary | ICD-10-CM | POA: Diagnosis not present

## 2016-03-01 DIAGNOSIS — N183 Chronic kidney disease, stage 3 (moderate): Secondary | ICD-10-CM | POA: Diagnosis not present

## 2016-03-01 DIAGNOSIS — Z86718 Personal history of other venous thrombosis and embolism: Secondary | ICD-10-CM | POA: Diagnosis not present

## 2016-03-01 DIAGNOSIS — I5032 Chronic diastolic (congestive) heart failure: Secondary | ICD-10-CM | POA: Diagnosis not present

## 2016-03-01 DIAGNOSIS — L89154 Pressure ulcer of sacral region, stage 4: Secondary | ICD-10-CM | POA: Diagnosis not present

## 2016-03-01 DIAGNOSIS — G8311 Monoplegia of lower limb affecting right dominant side: Secondary | ICD-10-CM | POA: Diagnosis not present

## 2016-03-01 DIAGNOSIS — C7951 Secondary malignant neoplasm of bone: Secondary | ICD-10-CM | POA: Diagnosis not present

## 2016-03-04 DIAGNOSIS — C7951 Secondary malignant neoplasm of bone: Secondary | ICD-10-CM | POA: Diagnosis not present

## 2016-03-04 DIAGNOSIS — Z466 Encounter for fitting and adjustment of urinary device: Secondary | ICD-10-CM | POA: Diagnosis not present

## 2016-03-04 DIAGNOSIS — Z86718 Personal history of other venous thrombosis and embolism: Secondary | ICD-10-CM | POA: Diagnosis not present

## 2016-03-04 DIAGNOSIS — I5032 Chronic diastolic (congestive) heart failure: Secondary | ICD-10-CM | POA: Diagnosis not present

## 2016-03-04 DIAGNOSIS — N183 Chronic kidney disease, stage 3 (moderate): Secondary | ICD-10-CM | POA: Diagnosis not present

## 2016-03-04 DIAGNOSIS — C3490 Malignant neoplasm of unspecified part of unspecified bronchus or lung: Secondary | ICD-10-CM | POA: Diagnosis not present

## 2016-03-04 DIAGNOSIS — L89154 Pressure ulcer of sacral region, stage 4: Secondary | ICD-10-CM | POA: Diagnosis not present

## 2016-03-04 DIAGNOSIS — G8311 Monoplegia of lower limb affecting right dominant side: Secondary | ICD-10-CM | POA: Diagnosis not present

## 2016-03-04 DIAGNOSIS — I130 Hypertensive heart and chronic kidney disease with heart failure and stage 1 through stage 4 chronic kidney disease, or unspecified chronic kidney disease: Secondary | ICD-10-CM | POA: Diagnosis not present

## 2016-03-04 DIAGNOSIS — I2510 Atherosclerotic heart disease of native coronary artery without angina pectoris: Secondary | ICD-10-CM | POA: Diagnosis not present

## 2016-03-04 DIAGNOSIS — Z87440 Personal history of urinary (tract) infections: Secondary | ICD-10-CM | POA: Diagnosis not present

## 2016-03-05 DIAGNOSIS — C3490 Malignant neoplasm of unspecified part of unspecified bronchus or lung: Secondary | ICD-10-CM | POA: Diagnosis not present

## 2016-03-05 DIAGNOSIS — N183 Chronic kidney disease, stage 3 (moderate): Secondary | ICD-10-CM | POA: Diagnosis not present

## 2016-03-05 DIAGNOSIS — I5032 Chronic diastolic (congestive) heart failure: Secondary | ICD-10-CM | POA: Diagnosis not present

## 2016-03-05 DIAGNOSIS — G8311 Monoplegia of lower limb affecting right dominant side: Secondary | ICD-10-CM | POA: Diagnosis not present

## 2016-03-05 DIAGNOSIS — C7951 Secondary malignant neoplasm of bone: Secondary | ICD-10-CM | POA: Diagnosis not present

## 2016-03-05 DIAGNOSIS — I130 Hypertensive heart and chronic kidney disease with heart failure and stage 1 through stage 4 chronic kidney disease, or unspecified chronic kidney disease: Secondary | ICD-10-CM | POA: Diagnosis not present

## 2016-03-05 DIAGNOSIS — L89154 Pressure ulcer of sacral region, stage 4: Secondary | ICD-10-CM | POA: Diagnosis not present

## 2016-03-05 DIAGNOSIS — I2510 Atherosclerotic heart disease of native coronary artery without angina pectoris: Secondary | ICD-10-CM | POA: Diagnosis not present

## 2016-03-05 DIAGNOSIS — Z87440 Personal history of urinary (tract) infections: Secondary | ICD-10-CM | POA: Diagnosis not present

## 2016-03-05 DIAGNOSIS — Z466 Encounter for fitting and adjustment of urinary device: Secondary | ICD-10-CM | POA: Diagnosis not present

## 2016-03-05 DIAGNOSIS — Z86718 Personal history of other venous thrombosis and embolism: Secondary | ICD-10-CM | POA: Diagnosis not present

## 2016-03-07 DIAGNOSIS — C7951 Secondary malignant neoplasm of bone: Secondary | ICD-10-CM | POA: Diagnosis not present

## 2016-03-07 DIAGNOSIS — I2510 Atherosclerotic heart disease of native coronary artery without angina pectoris: Secondary | ICD-10-CM | POA: Diagnosis not present

## 2016-03-07 DIAGNOSIS — I509 Heart failure, unspecified: Secondary | ICD-10-CM | POA: Diagnosis not present

## 2016-03-07 DIAGNOSIS — I8291 Chronic embolism and thrombosis of unspecified vein: Secondary | ICD-10-CM | POA: Diagnosis not present

## 2016-03-07 DIAGNOSIS — Z466 Encounter for fitting and adjustment of urinary device: Secondary | ICD-10-CM | POA: Diagnosis not present

## 2016-03-07 DIAGNOSIS — Z87440 Personal history of urinary (tract) infections: Secondary | ICD-10-CM | POA: Diagnosis not present

## 2016-03-07 DIAGNOSIS — N183 Chronic kidney disease, stage 3 (moderate): Secondary | ICD-10-CM | POA: Diagnosis not present

## 2016-03-07 DIAGNOSIS — D0220 Carcinoma in situ of unspecified bronchus and lung: Secondary | ICD-10-CM | POA: Diagnosis not present

## 2016-03-07 DIAGNOSIS — I130 Hypertensive heart and chronic kidney disease with heart failure and stage 1 through stage 4 chronic kidney disease, or unspecified chronic kidney disease: Secondary | ICD-10-CM | POA: Diagnosis not present

## 2016-03-07 DIAGNOSIS — I5032 Chronic diastolic (congestive) heart failure: Secondary | ICD-10-CM | POA: Diagnosis not present

## 2016-03-07 DIAGNOSIS — G8311 Monoplegia of lower limb affecting right dominant side: Secondary | ICD-10-CM | POA: Diagnosis not present

## 2016-03-07 DIAGNOSIS — L89154 Pressure ulcer of sacral region, stage 4: Secondary | ICD-10-CM | POA: Diagnosis not present

## 2016-03-07 DIAGNOSIS — L89153 Pressure ulcer of sacral region, stage 3: Secondary | ICD-10-CM | POA: Diagnosis not present

## 2016-03-07 DIAGNOSIS — Z86718 Personal history of other venous thrombosis and embolism: Secondary | ICD-10-CM | POA: Diagnosis not present

## 2016-03-07 DIAGNOSIS — C3490 Malignant neoplasm of unspecified part of unspecified bronchus or lung: Secondary | ICD-10-CM | POA: Diagnosis not present

## 2016-03-08 DIAGNOSIS — C7951 Secondary malignant neoplasm of bone: Secondary | ICD-10-CM | POA: Diagnosis not present

## 2016-03-08 DIAGNOSIS — Z86718 Personal history of other venous thrombosis and embolism: Secondary | ICD-10-CM | POA: Diagnosis not present

## 2016-03-08 DIAGNOSIS — G8311 Monoplegia of lower limb affecting right dominant side: Secondary | ICD-10-CM | POA: Diagnosis not present

## 2016-03-08 DIAGNOSIS — I5032 Chronic diastolic (congestive) heart failure: Secondary | ICD-10-CM | POA: Diagnosis not present

## 2016-03-08 DIAGNOSIS — I2510 Atherosclerotic heart disease of native coronary artery without angina pectoris: Secondary | ICD-10-CM | POA: Diagnosis not present

## 2016-03-08 DIAGNOSIS — N183 Chronic kidney disease, stage 3 (moderate): Secondary | ICD-10-CM | POA: Diagnosis not present

## 2016-03-08 DIAGNOSIS — I130 Hypertensive heart and chronic kidney disease with heart failure and stage 1 through stage 4 chronic kidney disease, or unspecified chronic kidney disease: Secondary | ICD-10-CM | POA: Diagnosis not present

## 2016-03-08 DIAGNOSIS — Z466 Encounter for fitting and adjustment of urinary device: Secondary | ICD-10-CM | POA: Diagnosis not present

## 2016-03-08 DIAGNOSIS — Z87440 Personal history of urinary (tract) infections: Secondary | ICD-10-CM | POA: Diagnosis not present

## 2016-03-08 DIAGNOSIS — C3490 Malignant neoplasm of unspecified part of unspecified bronchus or lung: Secondary | ICD-10-CM | POA: Diagnosis not present

## 2016-03-08 DIAGNOSIS — L89154 Pressure ulcer of sacral region, stage 4: Secondary | ICD-10-CM | POA: Diagnosis not present

## 2016-03-11 ENCOUNTER — Ambulatory Visit (INDEPENDENT_AMBULATORY_CARE_PROVIDER_SITE_OTHER): Payer: Medicare Other | Admitting: Cardiovascular Disease

## 2016-03-11 ENCOUNTER — Encounter: Payer: Self-pay | Admitting: Cardiovascular Disease

## 2016-03-11 VITALS — BP 106/70 | HR 80 | Ht 68.0 in

## 2016-03-11 DIAGNOSIS — N184 Chronic kidney disease, stage 4 (severe): Secondary | ICD-10-CM

## 2016-03-11 DIAGNOSIS — I129 Hypertensive chronic kidney disease with stage 1 through stage 4 chronic kidney disease, or unspecified chronic kidney disease: Secondary | ICD-10-CM

## 2016-03-11 DIAGNOSIS — I255 Ischemic cardiomyopathy: Secondary | ICD-10-CM | POA: Diagnosis not present

## 2016-03-11 NOTE — Patient Instructions (Signed)

## 2016-03-11 NOTE — Progress Notes (Signed)
Cardiology Office Note Date:  03/11/2016   ID:  [name], DOB 1934/02/24, MRN [id number]  PCP:  Wende Neighbors, MD  Cardiologist:  Sherren Mocha, MD    Chief Complaint  Patient presents with  . Atrial Fibrillation   History of Present Illness: [name] is a 82 y.o. female who presents for follow-up of CAD.  The patient has coronary artery disease and she has undergone coronary stenting in 2007 when she was treated with a bare-metal stent in the right coronary artery. She also has had difficult to control hypertension and chronic kidney disease.  Last creatinine is 3.6.   The patient has stage IV non-small cell lung cancer initially diagnosed in 2007 with disease recurrence in June 2016. She underwent chemotherapy and palliative radiotherapy of a paraspinous mass. Systemic chemotherapy was discontinued because of intolerance with disease progression still noted.  The patient returns today for cardiology follow-up. She has no chest pain or pressure. No shortness of breath at rest. She has not required oxygen in recent months. She is extremely weak and unable to walk. She complains of swelling in her right upper thigh, right hip, and right flank area. Otherwise no complaints today.   Past Medical History:  Diagnosis Date  . A-fib ([place])   . Chronic anemia   . Chronic diastolic (congestive) heart failure ([place])   . Chronic fatigue 04/04/2015  . Chronic fatigue 04/04/2015  . CKD (chronic kidney disease)   . Coronary artery disease    a.  LHC (06/04/05): LHC done in [place] with high grade RCA => s/p BMS to RCA;  b.  Nuclear (09/14/09): Lexiscan; Inf infarct with mild peri-infarct ishemia, EF 52%; Low Risk.  Marland Kitchen DVT (deep venous thrombosis) ([place])   . Foot drop, right   . GERD (gastroesophageal reflux disease)   . Hypercholesterolemia   . Hypertension   . Ischemic cardiomyopathy    a. Echo (07/26/13): Mild LVH, EF 35-40%, diff HK, inf AK, Gr 2 DD, Tr AI, mildly dilated Ao  root, MAC, mild MR, mild LAE, mod reduced RVSF.  . Non-small cell carcinoma of lung ([place])    Stage IV;spinal cancer; had chemo and radiation and immune therapy    Past Surgical History:  Procedure Laterality Date  . ABDOMINAL HYSTERECTOMY    . APPENDECTOMY    . CATARACT EXTRACTION Bilateral   . CHOLECYSTECTOMY  2011  . CYSTOSCOPY     with stent placement  . CYSTOSCOPY W/ URETERAL STENT PLACEMENT Right 12/12/2015   Procedure: CYSTOSCOPY WITH RETROGRADE PYELOGRAM WITH STENT EXCHANGE;  Surgeon: Franchot Gallo, MD;  Location: [facility];  Service: Urology;  Laterality: Right;  . ERCP  2011   with stone extraction, done same day as cholecystectomy  . HEMATOMA EVACUATION  December 2006   groin  . HEMORROIDECTOMY    . LAMINECTOMY    . PORTACATH PLACEMENT    . TONSILLECTOMY      Current Outpatient Prescriptions  Medication Sig Dispense Refill  . acetaminophen (TYLENOL) 500 MG tablet Take 500 mg by mouth daily as needed for mild pain.    . diphenoxylate-atropine (LOMOTIL) 2.5-0.025 MG tablet Take by mouth 4 (four) times daily as needed for diarrhea or loose stools. Reported on 11/28/2015    . furosemide (LASIX) 20 MG tablet Take 20 mg by mouth daily as needed for fluid or edema.    . lidocaine-prilocaine (EMLA) cream Apply 1 application topically as needed (for pain).    Marland Kitchen loperamide (IMODIUM) 2 MG capsule  Take 1 capsule (2 mg total) by mouth every 6 (six) hours as needed for diarrhea or loose stools. 30 capsule 0  . metoprolol tartrate (LOPRESSOR) 25 MG tablet Take 0.5 tablets (12.5 mg total) by mouth at bedtime. 30 tablet 0  . pantoprazole (PROTONIX) 40 MG tablet Take 40 mg by mouth at bedtime.    Marland Kitchen SANTYL ointment Apply 1 application topically at bedtime as needed (bed sores). For bed sores     No current facility-administered medications for this visit.     Allergies:   Amlodipine; Ciprofloxacin; Mirtazapine; Statins; Aspirin; Cephalexin; Hydralazine; Iron; Ambien [zolpidem tartrate];  Lorazepam; Sulfonamide derivatives; Zolpidem; Penicillins; Shellfish allergy; and Tape   Social History:  The patient  reports that she quit smoking about 25 years ago. Her smoking use included Cigarettes. She has a 35.00 pack-year smoking history. She has never used smokeless tobacco. She reports that she does not drink alcohol or use drugs.   Family History:  The patient's  family history includes Heart failure in her mother; Lung cancer in her brother and sister; Stomach cancer in her brother.    ROS:  Please see the history of present illness. All other systems are reviewed and negative.    PHYSICAL EXAM: VS:  BP 106/70   Pulse 80   Ht '5\' 8"'$  (1.727 m)  , BMI There is no height or weight on file to calculate BMI. GEN: Well nourished, well developed, in no acute distress  HEENT: normal  Neck: no JVD, no masses. No carotid bruits Cardiac: RRR without murmur or gallop                Respiratory:  clear to auscultation bilaterally, normal work of breathing GI: soft, nontender, nondistended, + BS MS: no deformity or atrophy  Ext: no pretibial edema. There is swelling around the right upper thigh and hip area Skin: warm and dry, no rash Neuro:  Strength and sensation are intact Psych: euthymic mood, full affect  EKG:  EKG is ordered today. The ekg ordered today shows NSR 83 bpm, T wave abnormality consider anterior/lateral ischemia  Recent Labs: 07/25/2015: B Natriuretic Peptide 1,017.4 11/28/2015: Magnesium 1.5; TSH 3.508 12/13/2015: ALT <9; BUN 48.6; Creatinine 3.6; HGB 10.3; Platelets 293; Potassium 3.6; Sodium 138   Lipid Panel     Component Value Date/Time   CHOL 248 (H) 07/29/2014 0650   TRIG 179 (H) 07/29/2014 0650   HDL 32 (L) 07/29/2014 0650   CHOLHDL 7.8 07/29/2014 0650   VLDL 36 07/29/2014 0650   LDLCALC 180 (H) 07/29/2014 0650   LDLDIRECT 209.4 05/19/2008 1024      Wt Readings from Last 3 Encounters:  12/12/15 130 lb (59 kg)  12/07/15 130 lb (59 kg)  11/28/15  130 lb 11.7 oz (59.3 kg)     Cardiac Studies Reviewed: Additional studies/ records that were reviewed today include: ECHO 06/2015  Study Conclusions  - Left ventricle: The cavity size was normal. Wall thickness was  normal. Systolic function was normal. The estimated ejection  fraction was in the range of 50% to 55%. There is enhanced  echogenicity, thinning and akinesis of the inferior wall  consistent with prior infarct. Doppler parameters are consistent  with abnormal left ventricular relaxation (grade 1 diastolic  dysfunction). The E/e&' ratio is >15, suggesting elevated LV  filling pressure. - Aortic valve: Trileaflet; mildly calcified leaflets.  Transvalvular velocity was minimally increased. There was no  stenosis. There was moderate regurgitation. Valve area (VTI):  3.26 cm^2. Valve  area (Vmax): 2.39 cm^2. Valve area (Vmean): 2.4  cm^2. - Mitral valve: Mildly thickened leaflets . There was moderate  regurgitation. - Left atrium: The atrium was at the upper limits of normal in  size. - Inferior vena cava: The vessel was normal in size. The  respirophasic diameter changes were in the normal range (= 50%),  consistent with normal central venous pressure. - Pericardium, extracardiac: There was a left pleural effusion.  Impressions:  - Compared to a prior echo in 07/2014, the EF is stable at 50-55%.  There is diastolic dysfunction with elevated LV filling pressure.  There appears to be scar of the inferior wall.  ASSESSMENT AND PLAN: 1. CAD, native vessel, without angina 2. Essential hypertension with CKD IV 3. Non-small cell lung cancer, metastatic - followed closely by Dr Julien Nordmann 4. Right thigh/hip swelling  Overall seems stable from cardiac perspective. Wonder if right leg swelling is related to lymphatic process as it has an unusual pattern. Will forward to Dr Mckinley Jewel with CT's upcoming - may want to get a CT of the abdomen and pelvis if this is not  already ordered per protocol.  Current medicines are reviewed with the patient today.  The patient does not have concerns regarding medicines.  Labs/ tests ordered today include:   Orders Placed This Encounter  Procedures  . EKG 12-Lead    Disposition:   FU 6 months  Signed, Sherren Mocha, MD  03/11/2016 1:36 PM    [facility] [address], [place], [place]  [address] Phone: [phone]; Fax: [phone]

## 2016-03-12 DIAGNOSIS — I5032 Chronic diastolic (congestive) heart failure: Secondary | ICD-10-CM | POA: Diagnosis not present

## 2016-03-12 DIAGNOSIS — G8311 Monoplegia of lower limb affecting right dominant side: Secondary | ICD-10-CM | POA: Diagnosis not present

## 2016-03-12 DIAGNOSIS — Z86718 Personal history of other venous thrombosis and embolism: Secondary | ICD-10-CM | POA: Diagnosis not present

## 2016-03-12 DIAGNOSIS — Z466 Encounter for fitting and adjustment of urinary device: Secondary | ICD-10-CM | POA: Diagnosis not present

## 2016-03-12 DIAGNOSIS — C3490 Malignant neoplasm of unspecified part of unspecified bronchus or lung: Secondary | ICD-10-CM | POA: Diagnosis not present

## 2016-03-12 DIAGNOSIS — L89154 Pressure ulcer of sacral region, stage 4: Secondary | ICD-10-CM | POA: Diagnosis not present

## 2016-03-12 DIAGNOSIS — I130 Hypertensive heart and chronic kidney disease with heart failure and stage 1 through stage 4 chronic kidney disease, or unspecified chronic kidney disease: Secondary | ICD-10-CM | POA: Diagnosis not present

## 2016-03-12 DIAGNOSIS — Z87440 Personal history of urinary (tract) infections: Secondary | ICD-10-CM | POA: Diagnosis not present

## 2016-03-12 DIAGNOSIS — C7951 Secondary malignant neoplasm of bone: Secondary | ICD-10-CM | POA: Diagnosis not present

## 2016-03-12 DIAGNOSIS — N183 Chronic kidney disease, stage 3 (moderate): Secondary | ICD-10-CM | POA: Diagnosis not present

## 2016-03-12 DIAGNOSIS — I2510 Atherosclerotic heart disease of native coronary artery without angina pectoris: Secondary | ICD-10-CM | POA: Diagnosis not present

## 2016-03-14 DIAGNOSIS — G8311 Monoplegia of lower limb affecting right dominant side: Secondary | ICD-10-CM | POA: Diagnosis not present

## 2016-03-14 DIAGNOSIS — Z466 Encounter for fitting and adjustment of urinary device: Secondary | ICD-10-CM | POA: Diagnosis not present

## 2016-03-14 DIAGNOSIS — I130 Hypertensive heart and chronic kidney disease with heart failure and stage 1 through stage 4 chronic kidney disease, or unspecified chronic kidney disease: Secondary | ICD-10-CM | POA: Diagnosis not present

## 2016-03-14 DIAGNOSIS — C3490 Malignant neoplasm of unspecified part of unspecified bronchus or lung: Secondary | ICD-10-CM | POA: Diagnosis not present

## 2016-03-14 DIAGNOSIS — L89154 Pressure ulcer of sacral region, stage 4: Secondary | ICD-10-CM | POA: Diagnosis not present

## 2016-03-14 DIAGNOSIS — N183 Chronic kidney disease, stage 3 (moderate): Secondary | ICD-10-CM | POA: Diagnosis not present

## 2016-03-14 DIAGNOSIS — I2510 Atherosclerotic heart disease of native coronary artery without angina pectoris: Secondary | ICD-10-CM | POA: Diagnosis not present

## 2016-03-14 DIAGNOSIS — C7951 Secondary malignant neoplasm of bone: Secondary | ICD-10-CM | POA: Diagnosis not present

## 2016-03-14 DIAGNOSIS — Z86718 Personal history of other venous thrombosis and embolism: Secondary | ICD-10-CM | POA: Diagnosis not present

## 2016-03-14 DIAGNOSIS — Z87440 Personal history of urinary (tract) infections: Secondary | ICD-10-CM | POA: Diagnosis not present

## 2016-03-14 DIAGNOSIS — I5032 Chronic diastolic (congestive) heart failure: Secondary | ICD-10-CM | POA: Diagnosis not present

## 2016-03-15 DIAGNOSIS — I130 Hypertensive heart and chronic kidney disease with heart failure and stage 1 through stage 4 chronic kidney disease, or unspecified chronic kidney disease: Secondary | ICD-10-CM | POA: Diagnosis not present

## 2016-03-15 DIAGNOSIS — I2510 Atherosclerotic heart disease of native coronary artery without angina pectoris: Secondary | ICD-10-CM | POA: Diagnosis not present

## 2016-03-15 DIAGNOSIS — C3490 Malignant neoplasm of unspecified part of unspecified bronchus or lung: Secondary | ICD-10-CM | POA: Diagnosis not present

## 2016-03-15 DIAGNOSIS — N183 Chronic kidney disease, stage 3 (moderate): Secondary | ICD-10-CM | POA: Diagnosis not present

## 2016-03-15 DIAGNOSIS — Z466 Encounter for fitting and adjustment of urinary device: Secondary | ICD-10-CM | POA: Diagnosis not present

## 2016-03-15 DIAGNOSIS — C7951 Secondary malignant neoplasm of bone: Secondary | ICD-10-CM | POA: Diagnosis not present

## 2016-03-15 DIAGNOSIS — L89154 Pressure ulcer of sacral region, stage 4: Secondary | ICD-10-CM | POA: Diagnosis not present

## 2016-03-15 DIAGNOSIS — G8311 Monoplegia of lower limb affecting right dominant side: Secondary | ICD-10-CM | POA: Diagnosis not present

## 2016-03-15 DIAGNOSIS — Z87440 Personal history of urinary (tract) infections: Secondary | ICD-10-CM | POA: Diagnosis not present

## 2016-03-15 DIAGNOSIS — Z86718 Personal history of other venous thrombosis and embolism: Secondary | ICD-10-CM | POA: Diagnosis not present

## 2016-03-15 DIAGNOSIS — I5032 Chronic diastolic (congestive) heart failure: Secondary | ICD-10-CM | POA: Diagnosis not present

## 2016-03-19 DIAGNOSIS — G8311 Monoplegia of lower limb affecting right dominant side: Secondary | ICD-10-CM | POA: Diagnosis not present

## 2016-03-19 DIAGNOSIS — I130 Hypertensive heart and chronic kidney disease with heart failure and stage 1 through stage 4 chronic kidney disease, or unspecified chronic kidney disease: Secondary | ICD-10-CM | POA: Diagnosis not present

## 2016-03-19 DIAGNOSIS — N183 Chronic kidney disease, stage 3 (moderate): Secondary | ICD-10-CM | POA: Diagnosis not present

## 2016-03-19 DIAGNOSIS — I2510 Atherosclerotic heart disease of native coronary artery without angina pectoris: Secondary | ICD-10-CM | POA: Diagnosis not present

## 2016-03-19 DIAGNOSIS — I5032 Chronic diastolic (congestive) heart failure: Secondary | ICD-10-CM | POA: Diagnosis not present

## 2016-03-19 DIAGNOSIS — L89154 Pressure ulcer of sacral region, stage 4: Secondary | ICD-10-CM | POA: Diagnosis not present

## 2016-03-19 DIAGNOSIS — Z86718 Personal history of other venous thrombosis and embolism: Secondary | ICD-10-CM | POA: Diagnosis not present

## 2016-03-19 DIAGNOSIS — C7951 Secondary malignant neoplasm of bone: Secondary | ICD-10-CM | POA: Diagnosis not present

## 2016-03-19 DIAGNOSIS — C3490 Malignant neoplasm of unspecified part of unspecified bronchus or lung: Secondary | ICD-10-CM | POA: Diagnosis not present

## 2016-03-19 DIAGNOSIS — Z87440 Personal history of urinary (tract) infections: Secondary | ICD-10-CM | POA: Diagnosis not present

## 2016-03-19 DIAGNOSIS — Z466 Encounter for fitting and adjustment of urinary device: Secondary | ICD-10-CM | POA: Diagnosis not present

## 2016-03-20 ENCOUNTER — Other Ambulatory Visit (HOSPITAL_BASED_OUTPATIENT_CLINIC_OR_DEPARTMENT_OTHER): Payer: Medicare Other

## 2016-03-20 ENCOUNTER — Ambulatory Visit (HOSPITAL_BASED_OUTPATIENT_CLINIC_OR_DEPARTMENT_OTHER): Payer: Medicare Other

## 2016-03-20 ENCOUNTER — Ambulatory Visit: Payer: Medicare Other | Admitting: Internal Medicine

## 2016-03-20 DIAGNOSIS — E43 Unspecified severe protein-calorie malnutrition: Secondary | ICD-10-CM

## 2016-03-20 DIAGNOSIS — E7800 Pure hypercholesterolemia, unspecified: Secondary | ICD-10-CM | POA: Diagnosis not present

## 2016-03-20 DIAGNOSIS — C7951 Secondary malignant neoplasm of bone: Secondary | ICD-10-CM

## 2016-03-20 DIAGNOSIS — N183 Chronic kidney disease, stage 3 (moderate): Secondary | ICD-10-CM

## 2016-03-20 DIAGNOSIS — Z95828 Presence of other vascular implants and grafts: Secondary | ICD-10-CM | POA: Insufficient documentation

## 2016-03-20 DIAGNOSIS — C3492 Malignant neoplasm of unspecified part of left bronchus or lung: Secondary | ICD-10-CM

## 2016-03-20 DIAGNOSIS — N179 Acute kidney failure, unspecified: Secondary | ICD-10-CM

## 2016-03-20 DIAGNOSIS — D638 Anemia in other chronic diseases classified elsewhere: Secondary | ICD-10-CM

## 2016-03-20 DIAGNOSIS — R5382 Chronic fatigue, unspecified: Secondary | ICD-10-CM

## 2016-03-20 LAB — COMPREHENSIVE METABOLIC PANEL
ALT: 13 U/L (ref 0–55)
ANION GAP: 9 meq/L (ref 3–11)
AST: 20 U/L (ref 5–34)
Albumin: <2 g/dL — ABNORMAL LOW (ref 3.5–5.0)
Alkaline Phosphatase: 179 U/L — ABNORMAL HIGH (ref 40–150)
BILIRUBIN TOTAL: 0.3 mg/dL (ref 0.20–1.20)
BUN: 45.1 mg/dL — AB (ref 7.0–26.0)
CALCIUM: 7.7 mg/dL — AB (ref 8.4–10.4)
CHLORIDE: 117 meq/L — AB (ref 98–109)
CO2: 14 meq/L — AB (ref 22–29)
CREATININE: 2.8 mg/dL — AB (ref 0.6–1.1)
EGFR: 15 mL/min/{1.73_m2} — ABNORMAL LOW (ref >90)
Glucose: 98 mg/dl (ref 70–140)
Potassium: 3.9 mEq/L (ref 3.5–5.1)
Sodium: 139 mEq/L (ref 136–145)
TOTAL PROTEIN: 5.6 g/dL — AB (ref 6.4–8.3)

## 2016-03-20 LAB — CBC WITH DIFFERENTIAL/PLATELET
BASO%: 0.2 % (ref 0.0–2.0)
Basophils Absolute: 0 10*3/uL (ref 0.0–0.1)
EOS%: 1.6 % (ref 0.0–7.0)
Eosinophils Absolute: 0.1 10*3/uL (ref 0.0–0.5)
HEMATOCRIT: 29.5 % — AB (ref 34.8–46.6)
HGB: 9.6 g/dL — ABNORMAL LOW (ref 11.6–15.9)
LYMPH#: 0.8 10*3/uL — AB (ref 0.9–3.3)
LYMPH%: 9.4 % — ABNORMAL LOW (ref 14.0–49.7)
MCH: 28.6 pg (ref 25.1–34.0)
MCHC: 32.5 g/dL (ref 31.5–36.0)
MCV: 87.8 fL (ref 79.5–101.0)
MONO#: 1.1 10*3/uL — AB (ref 0.1–0.9)
MONO%: 13 % (ref 0.0–14.0)
NEUT%: 75.8 % (ref 38.4–76.8)
NEUTROS ABS: 6.2 10*3/uL (ref 1.5–6.5)
PLATELETS: 140 10*3/uL — AB (ref 145–400)
RBC: 3.36 10*6/uL — ABNORMAL LOW (ref 3.70–5.45)
RDW: 16 % — AB (ref 11.2–14.5)
WBC: 8.2 10*3/uL (ref 3.9–10.3)

## 2016-03-20 LAB — TSH: TSH: 3.501 m[IU]/L (ref 0.308–3.960)

## 2016-03-20 MED ORDER — HEPARIN SOD (PORK) LOCK FLUSH 100 UNIT/ML IV SOLN
500.0000 [IU] | Freq: Once | INTRAVENOUS | Status: AC | PRN
  Administered 2016-03-20: 500 [IU] via INTRAVENOUS
  Filled 2016-03-20: qty 5

## 2016-03-20 MED ORDER — SODIUM CHLORIDE 0.9% FLUSH
10.0000 mL | INTRAVENOUS | Status: DC | PRN
  Administered 2016-03-20: 10 mL via INTRAVENOUS
  Filled 2016-03-20: qty 10

## 2016-03-21 ENCOUNTER — Ambulatory Visit (HOSPITAL_COMMUNITY)
Admission: RE | Admit: 2016-03-21 | Discharge: 2016-03-21 | Disposition: A | Discharge status: Home / Self Care | Payer: Medicare Other | Source: Ambulatory Visit | Attending: Internal Medicine | Admitting: Internal Medicine

## 2016-03-21 ENCOUNTER — Encounter (HOSPITAL_COMMUNITY): Payer: Self-pay

## 2016-03-21 ENCOUNTER — Telehealth: Payer: Self-pay | Admitting: *Deleted

## 2016-03-21 DIAGNOSIS — D638 Anemia in other chronic diseases classified elsewhere: Secondary | ICD-10-CM | POA: Diagnosis not present

## 2016-03-21 DIAGNOSIS — C7951 Secondary malignant neoplasm of bone: Secondary | ICD-10-CM | POA: Insufficient documentation

## 2016-03-21 DIAGNOSIS — C3490 Malignant neoplasm of unspecified part of unspecified bronchus or lung: Secondary | ICD-10-CM | POA: Diagnosis not present

## 2016-03-21 NOTE — Telephone Encounter (Addendum)
Pt called states " I need some more help. I cant change my depends by myself, I cant get up to eat or go to the bathroom or eat by myself. My daughter is unable to help during the day as she is busy. She comes in the early morning and then at night. But sometimes I need cleaning up and I have to wait until she comes. My insurance said I can have a home health aide and it's covered under my insurance." Discussed with pt I will call [place] and [facility] ( who currently comes to pt's home for PT) and reviewed with MD.   Call to [facility] they car not able to provide assistance as pt is established with [place]. [facility] advised they do not provide a personal home assistant to assist in cleaning pt's/feeding through out the day. They can provide an aide to come 1x daily. Call to [facility] [phone] they do not service pt's location. Call to [facility] [phone] unable to reach anyone. Phone only rings. [facility] [phone] advised they do not service this area.   Will forward to [place] for additional assistance with this concern.

## 2016-03-22 DIAGNOSIS — C3490 Malignant neoplasm of unspecified part of unspecified bronchus or lung: Secondary | ICD-10-CM | POA: Diagnosis not present

## 2016-03-22 DIAGNOSIS — L89154 Pressure ulcer of sacral region, stage 4: Secondary | ICD-10-CM | POA: Diagnosis not present

## 2016-03-22 DIAGNOSIS — C7951 Secondary malignant neoplasm of bone: Secondary | ICD-10-CM | POA: Diagnosis not present

## 2016-03-22 DIAGNOSIS — G8311 Monoplegia of lower limb affecting right dominant side: Secondary | ICD-10-CM | POA: Diagnosis not present

## 2016-03-22 DIAGNOSIS — I2510 Atherosclerotic heart disease of native coronary artery without angina pectoris: Secondary | ICD-10-CM | POA: Diagnosis not present

## 2016-03-22 DIAGNOSIS — I5032 Chronic diastolic (congestive) heart failure: Secondary | ICD-10-CM | POA: Diagnosis not present

## 2016-03-22 DIAGNOSIS — Z87440 Personal history of urinary (tract) infections: Secondary | ICD-10-CM | POA: Diagnosis not present

## 2016-03-22 DIAGNOSIS — N183 Chronic kidney disease, stage 3 (moderate): Secondary | ICD-10-CM | POA: Diagnosis not present

## 2016-03-22 DIAGNOSIS — I130 Hypertensive heart and chronic kidney disease with heart failure and stage 1 through stage 4 chronic kidney disease, or unspecified chronic kidney disease: Secondary | ICD-10-CM | POA: Diagnosis not present

## 2016-03-22 DIAGNOSIS — Z86718 Personal history of other venous thrombosis and embolism: Secondary | ICD-10-CM | POA: Diagnosis not present

## 2016-03-22 DIAGNOSIS — Z466 Encounter for fitting and adjustment of urinary device: Secondary | ICD-10-CM | POA: Diagnosis not present

## 2016-03-22 DIAGNOSIS — R222 Localized swelling, mass and lump, trunk: Secondary | ICD-10-CM | POA: Diagnosis not present

## 2016-03-26 DIAGNOSIS — G8311 Monoplegia of lower limb affecting right dominant side: Secondary | ICD-10-CM | POA: Diagnosis not present

## 2016-03-26 DIAGNOSIS — I130 Hypertensive heart and chronic kidney disease with heart failure and stage 1 through stage 4 chronic kidney disease, or unspecified chronic kidney disease: Secondary | ICD-10-CM | POA: Diagnosis not present

## 2016-03-26 DIAGNOSIS — I5032 Chronic diastolic (congestive) heart failure: Secondary | ICD-10-CM | POA: Diagnosis not present

## 2016-03-26 DIAGNOSIS — C7951 Secondary malignant neoplasm of bone: Secondary | ICD-10-CM | POA: Diagnosis not present

## 2016-03-26 DIAGNOSIS — Z466 Encounter for fitting and adjustment of urinary device: Secondary | ICD-10-CM | POA: Diagnosis not present

## 2016-03-26 DIAGNOSIS — Z87440 Personal history of urinary (tract) infections: Secondary | ICD-10-CM | POA: Diagnosis not present

## 2016-03-26 DIAGNOSIS — Z86718 Personal history of other venous thrombosis and embolism: Secondary | ICD-10-CM | POA: Diagnosis not present

## 2016-03-26 DIAGNOSIS — C3490 Malignant neoplasm of unspecified part of unspecified bronchus or lung: Secondary | ICD-10-CM | POA: Diagnosis not present

## 2016-03-26 DIAGNOSIS — L89154 Pressure ulcer of sacral region, stage 4: Secondary | ICD-10-CM | POA: Diagnosis not present

## 2016-03-26 DIAGNOSIS — I2510 Atherosclerotic heart disease of native coronary artery without angina pectoris: Secondary | ICD-10-CM | POA: Diagnosis not present

## 2016-03-26 DIAGNOSIS — N183 Chronic kidney disease, stage 3 (moderate): Secondary | ICD-10-CM | POA: Diagnosis not present

## 2016-03-27 ENCOUNTER — Ambulatory Visit (HOSPITAL_BASED_OUTPATIENT_CLINIC_OR_DEPARTMENT_OTHER): Payer: Medicare Other | Admitting: Internal Medicine

## 2016-03-27 ENCOUNTER — Telehealth: Payer: Self-pay | Admitting: Medical Oncology

## 2016-03-27 ENCOUNTER — Telehealth: Payer: Self-pay | Admitting: Internal Medicine

## 2016-03-27 ENCOUNTER — Other Ambulatory Visit: Payer: Self-pay | Admitting: Medical Oncology

## 2016-03-27 ENCOUNTER — Encounter: Payer: Self-pay | Admitting: Internal Medicine

## 2016-03-27 VITALS — BP 139/65 | HR 73 | Temp 97.5°F | Resp 17 | Ht 68.0 in

## 2016-03-27 DIAGNOSIS — C7951 Secondary malignant neoplasm of bone: Secondary | ICD-10-CM

## 2016-03-27 DIAGNOSIS — Z85118 Personal history of other malignant neoplasm of bronchus and lung: Secondary | ICD-10-CM

## 2016-03-27 DIAGNOSIS — R531 Weakness: Secondary | ICD-10-CM

## 2016-03-27 DIAGNOSIS — Z741 Need for assistance with personal care: Secondary | ICD-10-CM

## 2016-03-27 NOTE — Telephone Encounter (Signed)
Left message for Apolinar Junes to contact [facility] at [phone] and speak to social worker Dorsi Best for options for personal aide.

## 2016-03-27 NOTE — Telephone Encounter (Signed)
Called member services to request aid to stay with pt during the day . Pt unable to care for needs independently due to tumor on spine. Provider line [phone].

## 2016-03-27 NOTE — Progress Notes (Signed)
[facility] [phone]   [phone]  OFFICE PROGRESS NOTE  [name] Neighbors, MD [place] [address]  PRINCIPAL DIAGNOSIS: Stage IV non-small cell lung cancer diagnosed in March 2007, with disease recurrence in June 2016 with positive PDL 1 expression (50%).   PRIOR THERAPY:  1) Status post 6 cycles of systemic chemotherapy with carboplatin and docetaxel. Last dose was given January 16, 2006.  2) Palliative radiotherapy to the large right paraspinous mass with osseous invasion centered at L5 under the care of Dr. Valere Dross. 3) Systemic chemotherapy with carboplatin for AUC of 5 and paclitaxel 175 MG/M2 every 3 weeks with Neulasta support. First dose expected on 02/02/2015. Status post 2 cycles, last dose was given 02/23/2015 discontinued secondary to intolerance and mild disease progression. 4) Ketruda 200 mg IV every 3 weeks. First dose 04/14/2015. Status post 3 cycles. Treatment was discontinued after the patient had several hospitalization with recurrent infection and back abscess but were unrelated to her treatment.  CURRENT THERAPY: Observation.  DISEASE STAGE: Stage IV non-small cell lung cancer diagnosed in March of 2007.  CHEMOTHERAPY INTENT: Palliative  CURRENT # OF CHEMOTHERAPY CYCLES: 0  CURRENT ANTIEMETICS: None  CURRENT SMOKING STATUS: Nonsmoker  ORAL CHEMOTHERAPY AND CONSENT: None  CURRENT BISPHOSPHONATES USE: None  LIVING WILL AND CODE STATUS: Full code  INTERVAL HISTORY: [name] 82 y.o. female returns to the clinic today for followup visit accompanied by her daughter and grandson. The patient is feeling fine today with no specific complaints. She is currently on observation with no specific complaints since her last visit 6 months ago.. She continues to have weakness of the lower extremities and she needs some help at home. Her daughter is doing her best bet it is too much for her to take care of her mother  in addition to her daily job. She denied having any significant chest pain, shortness of breath, cough or hemoptysis. She has no nausea or vomiting. She denied having any significant fever or chills. She had repeat CT scan of the chest, abdomen and pelvis performed earlier today and she is here for evaluation and discussion of her scan results.  MEDICAL HISTORY: Past Medical History:  Diagnosis Date  . A-fib ([place])   . Chronic anemia   . Chronic diastolic (congestive) heart failure   . Chronic fatigue 04/04/2015  . Chronic fatigue 04/04/2015  . CKD (chronic kidney disease)   . Coronary artery disease    a.  LHC (06/04/05): LHC done in [place] with high grade RCA => s/p BMS to RCA;  b.  Nuclear (09/14/09): Lexiscan; Inf infarct with mild peri-infarct ishemia, EF 52%; Low Risk.  Marland Kitchen DVT (deep venous thrombosis) ([place])   . Foot drop, right   . GERD (gastroesophageal reflux disease)   . Hypercholesterolemia   . Hypertension   . Ischemic cardiomyopathy    a. Echo (07/26/13): Mild LVH, EF 35-40%, diff HK, inf AK, Gr 2 DD, Tr AI, mildly dilated Ao root, MAC, mild MR, mild LAE, mod reduced RVSF.  . Non-small cell carcinoma of lung ([place])    Stage IV;spinal cancer; had chemo and radiation and immune therapy    ALLERGIES:  is allergic to amlodipine; ciprofloxacin; mirtazapine; statins; aspirin; cephalexin; hydralazine; iron; ambien [zolpidem tartrate]; lorazepam; sulfonamide derivatives; zolpidem; penicillins; shellfish allergy; and tape.  MEDICATIONS:  Current Outpatient Prescriptions  Medication Sig Dispense Refill  . acetaminophen (TYLENOL) 500 MG tablet Take 500 mg by mouth daily  as needed for mild pain.    . diphenoxylate-atropine (LOMOTIL) 2.5-0.025 MG tablet Take by mouth 4 (four) times daily as needed for diarrhea or loose stools. Reported on 11/28/2015    . furosemide (LASIX) 20 MG tablet Take 20 mg by mouth daily as needed for fluid or edema.    . lidocaine-prilocaine (EMLA) cream Apply  1 application topically as needed (for pain).    Marland Kitchen loperamide (IMODIUM) 2 MG capsule Take 1 capsule (2 mg total) by mouth every 6 (six) hours as needed for diarrhea or loose stools. 30 capsule 0  . metoprolol tartrate (LOPRESSOR) 25 MG tablet Take 0.5 tablets (12.5 mg total) by mouth at bedtime. 30 tablet 0  . pantoprazole (PROTONIX) 40 MG tablet Take 40 mg by mouth at bedtime.    Marland Kitchen SANTYL ointment Apply 1 application topically at bedtime as needed (bed sores). For bed sores     No current facility-administered medications for this visit.     SURGICAL HISTORY:  Past Surgical History:  Procedure Laterality Date  . ABDOMINAL HYSTERECTOMY    . APPENDECTOMY    . CATARACT EXTRACTION Bilateral   . CHOLECYSTECTOMY  2011  . CYSTOSCOPY     with stent placement  . CYSTOSCOPY W/ URETERAL STENT PLACEMENT Right 12/12/2015   Procedure: CYSTOSCOPY WITH RETROGRADE PYELOGRAM WITH STENT EXCHANGE;  Surgeon: Franchot Gallo, MD;  Location: [facility];  Service: Urology;  Laterality: Right;  . ERCP  2011   with stone extraction, done same day as cholecystectomy  . HEMATOMA EVACUATION  December 2006   groin  . HEMORROIDECTOMY    . LAMINECTOMY    . PORTACATH PLACEMENT    . TONSILLECTOMY      REVIEW OF SYSTEMS:  A comprehensive review of systems was negative except for: Constitutional: positive for fatigue Musculoskeletal: positive for muscle weakness   PHYSICAL EXAMINATION: General appearance: alert, cooperative and no distress Head: Normocephalic, without obvious abnormality, atraumatic Neck: no adenopathy Lymph nodes: Cervical, supraclavicular, and axillary nodes normal. Resp: clear to auscultation bilaterally Back: symmetric, no curvature. ROM normal. No CVA tenderness., Decubitus ulcer in the lower back Cardio: regular rate and rhythm, S1, S2 normal, no murmur, click, rub or gallop GI: soft, non-tender; bowel sounds normal; no masses,  no organomegaly Extremities: extremities normal, atraumatic,  no cyanosis or edema Neurologic: Alert and oriented X 3, normal strength and tone. Normal symmetric reflexes. Normal coordination and gait  ECOG PERFORMANCE STATUS: 2 - Symptomatic, <50% confined to bed  Blood pressure 139/65, pulse 73, temperature 97.5 F (36.4 C), temperature source Oral, resp. rate 17, height '5\' 8"'$  (1.727 m), SpO2 100 %.  LABORATORY DATA: Lab Results  Component Value Date   WBC 8.2 03/20/2016   HGB 9.6 (L) 03/20/2016   HCT 29.5 (L) 03/20/2016   MCV 87.8 03/20/2016   PLT 140 (L) 03/20/2016      Chemistry      Component Value Date/Time   NA 139 03/20/2016 0848   K 3.9 03/20/2016 0848   CL 100 (L) 12/07/2015 0853   CL 104 06/19/2012 0958   CO2 14 (L) 03/20/2016 0848   BUN 45.1 (H) 03/20/2016 0848   CREATININE 2.8 (H) 03/20/2016 0848      Component Value Date/Time   CALCIUM 7.7 (L) 03/20/2016 0848   ALKPHOS 179 (H) 03/20/2016 0848   AST 20 03/20/2016 0848   ALT 13 03/20/2016 0848   BILITOT 0.30 03/20/2016 0848       RADIOGRAPHIC STUDIES: Ct Abdomen Pelvis Wo Contrast  Result Date: 03/21/2016 CLINICAL DATA:  Lung cancer.  Metastatic disease. EXAM: CT CHEST, ABDOMEN AND PELVIS WITHOUT CONTRAST TECHNIQUE: Multidetector CT imaging of the chest, abdomen and pelvis was performed following the standard protocol without IV contrast. COMPARISON:  12/13/2015 FINDINGS: CT CHEST FINDINGS Cardiovascular: Normal heart size. No pericardial effusion. Aortic atherosclerosis noted. Calcifications within the LAD, RCA and left circumflex coronary arteries identified. Mediastinum/Nodes: The trachea appears patent and is midline. Normal appearance of the esophagus. Sub- carinal lymph node appears increased in size when compared with previous exam measuring 1 cm, image 34 of series 2. On the previous exam this measured 6 mm. Right hilar node measures 1.3 cm, image 34 of series 2. On the previous exam this measured 1.2 cm. Lungs/Pleura: Small right pleural effusion. There is  overlying progressive airspace consolidation as well as aspirated barium. Diffuse bronchial wall thickening is identified. Moderate changes of centrilobular emphysema identified. Index left upper lobe pulmonary nodule measures 9 mm, image 56 of series 4. Previously this measured the same. The index cavitary nodule within the right upper lobe measures 6 mm, image 24 of series 4. Previously 7 mm. Posterior right upper lobe lung nodule measures 3 mm, image 89 of series 4. Previously 4 mm. Musculoskeletal: Mild scoliosis deformity involving the thoracic spine. No aggressive lytic or sclerotic bone lesion. CT ABDOMEN PELVIS FINDINGS Hepatobiliary: No focal liver abnormality is seen. Status post cholecystectomy. No biliary dilatation. Pancreas: Unremarkable. No pancreatic ductal dilatation or surrounding inflammatory changes. Spleen: Normal in size without focal abnormality. Adrenals/Urinary Tract: Normal appearance of the adrenal glands. Bilateral renal cortical scarring and volume loss noted. Cyst arising from the upper pole the right kidney is incompletely characterized without IV contrast, image 63 of series 2. Mild right hydronephrosis noted. Right ureteral stent is in place. Urinary bladder appears decompressed around a Foley catheter. Stomach/Bowel: The stomach is within normal limits. The small bowel loops have a normal course and caliber. No obstruction. Normal appearance of the colon. Vascular/Lymphatic: Calcified atherosclerotic disease involves the abdominal aorta. No aneurysm. No enlarged retroperitoneal or mesenteric adenopathy. No pelvic or inguinal adenopathy identified. Reproductive: Previous hysterectomy. Other: No free fluid or fluid collections identified within the abdomen or pelvis. Musculoskeletal: Destructive lesion associated with the right side of sacrum is again identified. This measures 1.9 x 3.9 cm, image 97 of series 2. Previously 3.4 x 4.0 cm. Soft tissue lesion involving the right side of  the L4 vertebra measures 1.8 x 2.1 cm, image 85 of series 2. Previously 2.0 x 2.3 cm. IMPRESSION: 1. The index pulmonary lesions are stable when compared with previous exam. 2. Index destructive lesions involving the lower lumbar spine and right side of sacrum are mildly decreased in size from previous exam. 3. Increase an airspace consolidation within the posterior medial right lower lobe worrisome for progressive and chronic aspiration. 4. Increase in size of sub- carinal lymph node which in the setting of chronic aspiration may be reactive. Electronically Signed   By: Kerby Moors M.D.   On: 03/21/2016 11:36   Ct Chest Wo Contrast  Result Date: 03/21/2016 CLINICAL DATA:  Lung cancer.  Metastatic disease. EXAM: CT CHEST, ABDOMEN AND PELVIS WITHOUT CONTRAST TECHNIQUE: Multidetector CT imaging of the chest, abdomen and pelvis was performed following the standard protocol without IV contrast. COMPARISON:  12/13/2015 FINDINGS: CT CHEST FINDINGS Cardiovascular: Normal heart size. No pericardial effusion. Aortic atherosclerosis noted. Calcifications within the LAD, RCA and left circumflex coronary arteries identified. Mediastinum/Nodes: The trachea appears patent and is midline.  Normal appearance of the esophagus. Sub- carinal lymph node appears increased in size when compared with previous exam measuring 1 cm, image 34 of series 2. On the previous exam this measured 6 mm. Right hilar node measures 1.3 cm, image 34 of series 2. On the previous exam this measured 1.2 cm. Lungs/Pleura: Small right pleural effusion. There is overlying progressive airspace consolidation as well as aspirated barium. Diffuse bronchial wall thickening is identified. Moderate changes of centrilobular emphysema identified. Index left upper lobe pulmonary nodule measures 9 mm, image 56 of series 4. Previously this measured the same. The index cavitary nodule within the right upper lobe measures 6 mm, image 24 of series 4. Previously 7 mm.  Posterior right upper lobe lung nodule measures 3 mm, image 89 of series 4. Previously 4 mm. Musculoskeletal: Mild scoliosis deformity involving the thoracic spine. No aggressive lytic or sclerotic bone lesion. CT ABDOMEN PELVIS FINDINGS Hepatobiliary: No focal liver abnormality is seen. Status post cholecystectomy. No biliary dilatation. Pancreas: Unremarkable. No pancreatic ductal dilatation or surrounding inflammatory changes. Spleen: Normal in size without focal abnormality. Adrenals/Urinary Tract: Normal appearance of the adrenal glands. Bilateral renal cortical scarring and volume loss noted. Cyst arising from the upper pole the right kidney is incompletely characterized without IV contrast, image 63 of series 2. Mild right hydronephrosis noted. Right ureteral stent is in place. Urinary bladder appears decompressed around a Foley catheter. Stomach/Bowel: The stomach is within normal limits. The small bowel loops have a normal course and caliber. No obstruction. Normal appearance of the colon. Vascular/Lymphatic: Calcified atherosclerotic disease involves the abdominal aorta. No aneurysm. No enlarged retroperitoneal or mesenteric adenopathy. No pelvic or inguinal adenopathy identified. Reproductive: Previous hysterectomy. Other: No free fluid or fluid collections identified within the abdomen or pelvis. Musculoskeletal: Destructive lesion associated with the right side of sacrum is again identified. This measures 1.9 x 3.9 cm, image 97 of series 2. Previously 3.4 x 4.0 cm. Soft tissue lesion involving the right side of the L4 vertebra measures 1.8 x 2.1 cm, image 85 of series 2. Previously 2.0 x 2.3 cm. IMPRESSION: 1. The index pulmonary lesions are stable when compared with previous exam. 2. Index destructive lesions involving the lower lumbar spine and right side of sacrum are mildly decreased in size from previous exam. 3. Increase an airspace consolidation within the posterior medial right lower lobe  worrisome for progressive and chronic aspiration. 4. Increase in size of sub- carinal lymph node which in the setting of chronic aspiration may be reactive. Electronically Signed   By: Kerby Moors M.D.   On: 03/21/2016 11:36    ASSESSMENT AND PLAN: This is a very pleasant 82 years old white female with history of stage IV non-small cell lung cancer diagnosed in March of 2007 status post 6 cycles of systemic chemotherapy with carboplatin and docetaxel and has been observation since that time with no evidence for disease progression for almost 9 years.  She was found recently to have evidence for disease recurrence with large lumbar paraspinous mass with osseous invasion and the core biopsy was consistent with metastatic squamous cell carcinoma.  She is status post palliative radiotherapy to the soft tissue mass in the lumbar area. The patient underwent systemic chemotherapy with carboplatin and paclitaxel is status post 2 cycle but she has rough time tolerating her systemic chemotherapy with significant pancytopenia, fatigue and weakness.  I discussed the scan results with the patient and her family. I recommended for her to discontinue her current treatment with  systemic chemotherapy with carboplatin and paclitaxel secondary to intolerance and mild disease progression. The patient has 50%  PDL 1 expression on her tumor tissue. She was started on treatment with Nat Math (pembrolizumab) for 3 cycles last dose was given 05/26/2015 but her treatment has been on hold for the last few months secondary to frequent hospitalization for back abscess and septicemia.  The recent restaging CT scan of the chest, abdomen and pelvis showed no evidence for disease progression. I discussed the scan results with the patient and her family. I recommended for her to continue on observation with repeat CT scan of the chest, abdomen and pelvis in 3 months. For the weakness of the lower extremities, we will contact her  insurance to see the would offer some home health to help the patient. For pain management, the patient will continue on Vicodin when necessary. The patient was advised to call immediately if she has any concerning symptoms in the interval.  All questions were answered. The patient knows to call the clinic with any problems, questions or concerns. We can certainly see the patient much sooner if necessary.  Disclaimer: This note was dictated with voice recognition software. Similar sounding words can inadvertently be transcribed and may not be corrected upon review.

## 2016-03-27 NOTE — Telephone Encounter (Signed)
Left message that I need number for [facility]

## 2016-03-27 NOTE — Telephone Encounter (Signed)
Gave patient avs report and appointments for December and January. Central radiology will call re scan.  °

## 2016-03-28 ENCOUNTER — Telehealth: Payer: Self-pay | Admitting: Medical Oncology

## 2016-03-28 DIAGNOSIS — G8311 Monoplegia of lower limb affecting right dominant side: Secondary | ICD-10-CM | POA: Diagnosis not present

## 2016-03-28 DIAGNOSIS — L89154 Pressure ulcer of sacral region, stage 4: Secondary | ICD-10-CM | POA: Diagnosis not present

## 2016-03-28 DIAGNOSIS — C7951 Secondary malignant neoplasm of bone: Secondary | ICD-10-CM | POA: Diagnosis not present

## 2016-03-28 DIAGNOSIS — C3490 Malignant neoplasm of unspecified part of unspecified bronchus or lung: Secondary | ICD-10-CM | POA: Diagnosis not present

## 2016-03-28 DIAGNOSIS — I5032 Chronic diastolic (congestive) heart failure: Secondary | ICD-10-CM | POA: Diagnosis not present

## 2016-03-28 DIAGNOSIS — I2510 Atherosclerotic heart disease of native coronary artery without angina pectoris: Secondary | ICD-10-CM | POA: Diagnosis not present

## 2016-03-28 DIAGNOSIS — N183 Chronic kidney disease, stage 3 (moderate): Secondary | ICD-10-CM | POA: Diagnosis not present

## 2016-03-28 DIAGNOSIS — I130 Hypertensive heart and chronic kidney disease with heart failure and stage 1 through stage 4 chronic kidney disease, or unspecified chronic kidney disease: Secondary | ICD-10-CM | POA: Diagnosis not present

## 2016-03-28 DIAGNOSIS — Z86718 Personal history of other venous thrombosis and embolism: Secondary | ICD-10-CM | POA: Diagnosis not present

## 2016-03-28 DIAGNOSIS — Z87440 Personal history of urinary (tract) infections: Secondary | ICD-10-CM | POA: Diagnosis not present

## 2016-03-28 DIAGNOSIS — Z466 Encounter for fitting and adjustment of urinary device: Secondary | ICD-10-CM | POA: Diagnosis not present

## 2016-03-28 NOTE — Telephone Encounter (Signed)
Member ID - [id number]. I called again per pt request to get an aide . I spoke to [place] and she was not aware of personal aide for 6-8 hr/day. She will try to call pt.

## 2016-03-29 DIAGNOSIS — I5032 Chronic diastolic (congestive) heart failure: Secondary | ICD-10-CM | POA: Diagnosis not present

## 2016-03-29 DIAGNOSIS — C7951 Secondary malignant neoplasm of bone: Secondary | ICD-10-CM | POA: Diagnosis not present

## 2016-03-29 DIAGNOSIS — Z87440 Personal history of urinary (tract) infections: Secondary | ICD-10-CM | POA: Diagnosis not present

## 2016-03-29 DIAGNOSIS — C3490 Malignant neoplasm of unspecified part of unspecified bronchus or lung: Secondary | ICD-10-CM | POA: Diagnosis not present

## 2016-03-29 DIAGNOSIS — Z86718 Personal history of other venous thrombosis and embolism: Secondary | ICD-10-CM | POA: Diagnosis not present

## 2016-03-29 DIAGNOSIS — I130 Hypertensive heart and chronic kidney disease with heart failure and stage 1 through stage 4 chronic kidney disease, or unspecified chronic kidney disease: Secondary | ICD-10-CM | POA: Diagnosis not present

## 2016-03-29 DIAGNOSIS — G8311 Monoplegia of lower limb affecting right dominant side: Secondary | ICD-10-CM | POA: Diagnosis not present

## 2016-03-29 DIAGNOSIS — L89154 Pressure ulcer of sacral region, stage 4: Secondary | ICD-10-CM | POA: Diagnosis not present

## 2016-03-29 DIAGNOSIS — Z466 Encounter for fitting and adjustment of urinary device: Secondary | ICD-10-CM | POA: Diagnosis not present

## 2016-03-29 DIAGNOSIS — N183 Chronic kidney disease, stage 3 (moderate): Secondary | ICD-10-CM | POA: Diagnosis not present

## 2016-03-29 DIAGNOSIS — I2510 Atherosclerotic heart disease of native coronary artery without angina pectoris: Secondary | ICD-10-CM | POA: Diagnosis not present

## 2016-04-01 DIAGNOSIS — C3490 Malignant neoplasm of unspecified part of unspecified bronchus or lung: Secondary | ICD-10-CM | POA: Diagnosis not present

## 2016-04-01 DIAGNOSIS — I2510 Atherosclerotic heart disease of native coronary artery without angina pectoris: Secondary | ICD-10-CM | POA: Diagnosis not present

## 2016-04-01 DIAGNOSIS — Z466 Encounter for fitting and adjustment of urinary device: Secondary | ICD-10-CM | POA: Diagnosis not present

## 2016-04-01 DIAGNOSIS — L89154 Pressure ulcer of sacral region, stage 4: Secondary | ICD-10-CM | POA: Diagnosis not present

## 2016-04-01 DIAGNOSIS — I5032 Chronic diastolic (congestive) heart failure: Secondary | ICD-10-CM | POA: Diagnosis not present

## 2016-04-01 DIAGNOSIS — I130 Hypertensive heart and chronic kidney disease with heart failure and stage 1 through stage 4 chronic kidney disease, or unspecified chronic kidney disease: Secondary | ICD-10-CM | POA: Diagnosis not present

## 2016-04-01 DIAGNOSIS — G8311 Monoplegia of lower limb affecting right dominant side: Secondary | ICD-10-CM | POA: Diagnosis not present

## 2016-04-01 DIAGNOSIS — C7951 Secondary malignant neoplasm of bone: Secondary | ICD-10-CM | POA: Diagnosis not present

## 2016-04-01 DIAGNOSIS — N183 Chronic kidney disease, stage 3 (moderate): Secondary | ICD-10-CM | POA: Diagnosis not present

## 2016-04-01 DIAGNOSIS — Z87440 Personal history of urinary (tract) infections: Secondary | ICD-10-CM | POA: Diagnosis not present

## 2016-04-01 DIAGNOSIS — Z86718 Personal history of other venous thrombosis and embolism: Secondary | ICD-10-CM | POA: Diagnosis not present

## 2016-04-02 ENCOUNTER — Ambulatory Visit (INDEPENDENT_AMBULATORY_CARE_PROVIDER_SITE_OTHER): Payer: Medicare Other | Admitting: Urology

## 2016-04-02 DIAGNOSIS — C3490 Malignant neoplasm of unspecified part of unspecified bronchus or lung: Secondary | ICD-10-CM | POA: Diagnosis not present

## 2016-04-02 DIAGNOSIS — N183 Chronic kidney disease, stage 3 (moderate): Secondary | ICD-10-CM | POA: Diagnosis not present

## 2016-04-02 DIAGNOSIS — Z466 Encounter for fitting and adjustment of urinary device: Secondary | ICD-10-CM | POA: Diagnosis not present

## 2016-04-02 DIAGNOSIS — C7951 Secondary malignant neoplasm of bone: Secondary | ICD-10-CM | POA: Diagnosis not present

## 2016-04-02 DIAGNOSIS — I2510 Atherosclerotic heart disease of native coronary artery without angina pectoris: Secondary | ICD-10-CM | POA: Diagnosis not present

## 2016-04-02 DIAGNOSIS — I5032 Chronic diastolic (congestive) heart failure: Secondary | ICD-10-CM | POA: Diagnosis not present

## 2016-04-02 DIAGNOSIS — L89154 Pressure ulcer of sacral region, stage 4: Secondary | ICD-10-CM | POA: Diagnosis not present

## 2016-04-02 DIAGNOSIS — Z86718 Personal history of other venous thrombosis and embolism: Secondary | ICD-10-CM | POA: Diagnosis not present

## 2016-04-02 DIAGNOSIS — G8311 Monoplegia of lower limb affecting right dominant side: Secondary | ICD-10-CM | POA: Diagnosis not present

## 2016-04-02 DIAGNOSIS — I130 Hypertensive heart and chronic kidney disease with heart failure and stage 1 through stage 4 chronic kidney disease, or unspecified chronic kidney disease: Secondary | ICD-10-CM | POA: Diagnosis not present

## 2016-04-02 DIAGNOSIS — Z87440 Personal history of urinary (tract) infections: Secondary | ICD-10-CM | POA: Diagnosis not present

## 2016-04-02 DIAGNOSIS — N1330 Unspecified hydronephrosis: Secondary | ICD-10-CM | POA: Diagnosis not present

## 2016-04-03 DIAGNOSIS — Z87440 Personal history of urinary (tract) infections: Secondary | ICD-10-CM | POA: Diagnosis not present

## 2016-04-03 DIAGNOSIS — I5032 Chronic diastolic (congestive) heart failure: Secondary | ICD-10-CM | POA: Diagnosis not present

## 2016-04-03 DIAGNOSIS — C3490 Malignant neoplasm of unspecified part of unspecified bronchus or lung: Secondary | ICD-10-CM | POA: Diagnosis not present

## 2016-04-03 DIAGNOSIS — Z466 Encounter for fitting and adjustment of urinary device: Secondary | ICD-10-CM | POA: Diagnosis not present

## 2016-04-03 DIAGNOSIS — I2510 Atherosclerotic heart disease of native coronary artery without angina pectoris: Secondary | ICD-10-CM | POA: Diagnosis not present

## 2016-04-03 DIAGNOSIS — G8311 Monoplegia of lower limb affecting right dominant side: Secondary | ICD-10-CM | POA: Diagnosis not present

## 2016-04-03 DIAGNOSIS — L89154 Pressure ulcer of sacral region, stage 4: Secondary | ICD-10-CM | POA: Diagnosis not present

## 2016-04-03 DIAGNOSIS — C7951 Secondary malignant neoplasm of bone: Secondary | ICD-10-CM | POA: Diagnosis not present

## 2016-04-03 DIAGNOSIS — I130 Hypertensive heart and chronic kidney disease with heart failure and stage 1 through stage 4 chronic kidney disease, or unspecified chronic kidney disease: Secondary | ICD-10-CM | POA: Diagnosis not present

## 2016-04-03 DIAGNOSIS — N183 Chronic kidney disease, stage 3 (moderate): Secondary | ICD-10-CM | POA: Diagnosis not present

## 2016-04-03 DIAGNOSIS — Z86718 Personal history of other venous thrombosis and embolism: Secondary | ICD-10-CM | POA: Diagnosis not present

## 2016-04-04 DIAGNOSIS — Z86718 Personal history of other venous thrombosis and embolism: Secondary | ICD-10-CM | POA: Diagnosis not present

## 2016-04-04 DIAGNOSIS — I2510 Atherosclerotic heart disease of native coronary artery without angina pectoris: Secondary | ICD-10-CM | POA: Diagnosis not present

## 2016-04-04 DIAGNOSIS — G8311 Monoplegia of lower limb affecting right dominant side: Secondary | ICD-10-CM | POA: Diagnosis not present

## 2016-04-04 DIAGNOSIS — Z87440 Personal history of urinary (tract) infections: Secondary | ICD-10-CM | POA: Diagnosis not present

## 2016-04-04 DIAGNOSIS — I5032 Chronic diastolic (congestive) heart failure: Secondary | ICD-10-CM | POA: Diagnosis not present

## 2016-04-04 DIAGNOSIS — Z466 Encounter for fitting and adjustment of urinary device: Secondary | ICD-10-CM | POA: Diagnosis not present

## 2016-04-04 DIAGNOSIS — C3490 Malignant neoplasm of unspecified part of unspecified bronchus or lung: Secondary | ICD-10-CM | POA: Diagnosis not present

## 2016-04-04 DIAGNOSIS — I130 Hypertensive heart and chronic kidney disease with heart failure and stage 1 through stage 4 chronic kidney disease, or unspecified chronic kidney disease: Secondary | ICD-10-CM | POA: Diagnosis not present

## 2016-04-04 DIAGNOSIS — L89154 Pressure ulcer of sacral region, stage 4: Secondary | ICD-10-CM | POA: Diagnosis not present

## 2016-04-04 DIAGNOSIS — C7951 Secondary malignant neoplasm of bone: Secondary | ICD-10-CM | POA: Diagnosis not present

## 2016-04-04 DIAGNOSIS — N183 Chronic kidney disease, stage 3 (moderate): Secondary | ICD-10-CM | POA: Diagnosis not present

## 2016-04-05 DIAGNOSIS — Z87440 Personal history of urinary (tract) infections: Secondary | ICD-10-CM | POA: Diagnosis not present

## 2016-04-05 DIAGNOSIS — Z466 Encounter for fitting and adjustment of urinary device: Secondary | ICD-10-CM | POA: Diagnosis not present

## 2016-04-05 DIAGNOSIS — I5032 Chronic diastolic (congestive) heart failure: Secondary | ICD-10-CM | POA: Diagnosis not present

## 2016-04-05 DIAGNOSIS — I130 Hypertensive heart and chronic kidney disease with heart failure and stage 1 through stage 4 chronic kidney disease, or unspecified chronic kidney disease: Secondary | ICD-10-CM | POA: Diagnosis not present

## 2016-04-05 DIAGNOSIS — C3490 Malignant neoplasm of unspecified part of unspecified bronchus or lung: Secondary | ICD-10-CM | POA: Diagnosis not present

## 2016-04-05 DIAGNOSIS — Z86718 Personal history of other venous thrombosis and embolism: Secondary | ICD-10-CM | POA: Diagnosis not present

## 2016-04-05 DIAGNOSIS — N183 Chronic kidney disease, stage 3 (moderate): Secondary | ICD-10-CM | POA: Diagnosis not present

## 2016-04-05 DIAGNOSIS — G8311 Monoplegia of lower limb affecting right dominant side: Secondary | ICD-10-CM | POA: Diagnosis not present

## 2016-04-05 DIAGNOSIS — L89154 Pressure ulcer of sacral region, stage 4: Secondary | ICD-10-CM | POA: Diagnosis not present

## 2016-04-05 DIAGNOSIS — C7951 Secondary malignant neoplasm of bone: Secondary | ICD-10-CM | POA: Diagnosis not present

## 2016-04-05 DIAGNOSIS — I2510 Atherosclerotic heart disease of native coronary artery without angina pectoris: Secondary | ICD-10-CM | POA: Diagnosis not present

## 2016-04-07 DIAGNOSIS — D0220 Carcinoma in situ of unspecified bronchus and lung: Secondary | ICD-10-CM | POA: Diagnosis not present

## 2016-04-07 DIAGNOSIS — L89153 Pressure ulcer of sacral region, stage 3: Secondary | ICD-10-CM | POA: Diagnosis not present

## 2016-04-07 DIAGNOSIS — I509 Heart failure, unspecified: Secondary | ICD-10-CM | POA: Diagnosis not present

## 2016-04-07 DIAGNOSIS — I8291 Chronic embolism and thrombosis of unspecified vein: Secondary | ICD-10-CM | POA: Diagnosis not present

## 2016-04-08 DIAGNOSIS — D0220 Carcinoma in situ of unspecified bronchus and lung: Secondary | ICD-10-CM | POA: Diagnosis not present

## 2016-04-08 DIAGNOSIS — N183 Chronic kidney disease, stage 3 (moderate): Secondary | ICD-10-CM | POA: Diagnosis not present

## 2016-04-08 DIAGNOSIS — M7061 Trochanteric bursitis, right hip: Secondary | ICD-10-CM | POA: Diagnosis not present

## 2016-04-08 DIAGNOSIS — E46 Unspecified protein-calorie malnutrition: Secondary | ICD-10-CM | POA: Diagnosis not present

## 2016-04-08 DIAGNOSIS — R000 Tachycardia, unspecified: Secondary | ICD-10-CM | POA: Diagnosis not present

## 2016-04-09 DIAGNOSIS — Z466 Encounter for fitting and adjustment of urinary device: Secondary | ICD-10-CM | POA: Diagnosis not present

## 2016-04-09 DIAGNOSIS — C3490 Malignant neoplasm of unspecified part of unspecified bronchus or lung: Secondary | ICD-10-CM | POA: Diagnosis not present

## 2016-04-09 DIAGNOSIS — G8311 Monoplegia of lower limb affecting right dominant side: Secondary | ICD-10-CM | POA: Diagnosis not present

## 2016-04-09 DIAGNOSIS — C7951 Secondary malignant neoplasm of bone: Secondary | ICD-10-CM | POA: Diagnosis not present

## 2016-04-09 DIAGNOSIS — I5032 Chronic diastolic (congestive) heart failure: Secondary | ICD-10-CM | POA: Diagnosis not present

## 2016-04-09 DIAGNOSIS — L89154 Pressure ulcer of sacral region, stage 4: Secondary | ICD-10-CM | POA: Diagnosis not present

## 2016-04-09 DIAGNOSIS — J9610 Chronic respiratory failure, unspecified whether with hypoxia or hypercapnia: Secondary | ICD-10-CM | POA: Diagnosis not present

## 2016-04-09 DIAGNOSIS — I130 Hypertensive heart and chronic kidney disease with heart failure and stage 1 through stage 4 chronic kidney disease, or unspecified chronic kidney disease: Secondary | ICD-10-CM | POA: Diagnosis not present

## 2016-04-09 DIAGNOSIS — N183 Chronic kidney disease, stage 3 (moderate): Secondary | ICD-10-CM | POA: Diagnosis not present

## 2016-04-09 DIAGNOSIS — I2510 Atherosclerotic heart disease of native coronary artery without angina pectoris: Secondary | ICD-10-CM | POA: Diagnosis not present

## 2016-04-09 DIAGNOSIS — D631 Anemia in chronic kidney disease: Secondary | ICD-10-CM | POA: Diagnosis not present

## 2016-04-09 DIAGNOSIS — I4891 Unspecified atrial fibrillation: Secondary | ICD-10-CM | POA: Diagnosis not present

## 2016-04-11 DIAGNOSIS — I2510 Atherosclerotic heart disease of native coronary artery without angina pectoris: Secondary | ICD-10-CM | POA: Diagnosis not present

## 2016-04-11 DIAGNOSIS — J9610 Chronic respiratory failure, unspecified whether with hypoxia or hypercapnia: Secondary | ICD-10-CM | POA: Diagnosis not present

## 2016-04-11 DIAGNOSIS — C7951 Secondary malignant neoplasm of bone: Secondary | ICD-10-CM | POA: Diagnosis not present

## 2016-04-11 DIAGNOSIS — I4891 Unspecified atrial fibrillation: Secondary | ICD-10-CM | POA: Diagnosis not present

## 2016-04-11 DIAGNOSIS — C3490 Malignant neoplasm of unspecified part of unspecified bronchus or lung: Secondary | ICD-10-CM | POA: Diagnosis not present

## 2016-04-11 DIAGNOSIS — G8311 Monoplegia of lower limb affecting right dominant side: Secondary | ICD-10-CM | POA: Diagnosis not present

## 2016-04-11 DIAGNOSIS — I130 Hypertensive heart and chronic kidney disease with heart failure and stage 1 through stage 4 chronic kidney disease, or unspecified chronic kidney disease: Secondary | ICD-10-CM | POA: Diagnosis not present

## 2016-04-11 DIAGNOSIS — D631 Anemia in chronic kidney disease: Secondary | ICD-10-CM | POA: Diagnosis not present

## 2016-04-11 DIAGNOSIS — N183 Chronic kidney disease, stage 3 (moderate): Secondary | ICD-10-CM | POA: Diagnosis not present

## 2016-04-11 DIAGNOSIS — L89154 Pressure ulcer of sacral region, stage 4: Secondary | ICD-10-CM | POA: Diagnosis not present

## 2016-04-11 DIAGNOSIS — I5032 Chronic diastolic (congestive) heart failure: Secondary | ICD-10-CM | POA: Diagnosis not present

## 2016-04-11 DIAGNOSIS — Z466 Encounter for fitting and adjustment of urinary device: Secondary | ICD-10-CM | POA: Diagnosis not present

## 2016-04-12 DIAGNOSIS — D631 Anemia in chronic kidney disease: Secondary | ICD-10-CM | POA: Diagnosis not present

## 2016-04-12 DIAGNOSIS — L89154 Pressure ulcer of sacral region, stage 4: Secondary | ICD-10-CM | POA: Diagnosis not present

## 2016-04-12 DIAGNOSIS — I5032 Chronic diastolic (congestive) heart failure: Secondary | ICD-10-CM | POA: Diagnosis not present

## 2016-04-12 DIAGNOSIS — Z466 Encounter for fitting and adjustment of urinary device: Secondary | ICD-10-CM | POA: Diagnosis not present

## 2016-04-12 DIAGNOSIS — C3490 Malignant neoplasm of unspecified part of unspecified bronchus or lung: Secondary | ICD-10-CM | POA: Diagnosis not present

## 2016-04-12 DIAGNOSIS — C7951 Secondary malignant neoplasm of bone: Secondary | ICD-10-CM | POA: Diagnosis not present

## 2016-04-12 DIAGNOSIS — J9610 Chronic respiratory failure, unspecified whether with hypoxia or hypercapnia: Secondary | ICD-10-CM | POA: Diagnosis not present

## 2016-04-12 DIAGNOSIS — G8311 Monoplegia of lower limb affecting right dominant side: Secondary | ICD-10-CM | POA: Diagnosis not present

## 2016-04-12 DIAGNOSIS — I4891 Unspecified atrial fibrillation: Secondary | ICD-10-CM | POA: Diagnosis not present

## 2016-04-12 DIAGNOSIS — I2510 Atherosclerotic heart disease of native coronary artery without angina pectoris: Secondary | ICD-10-CM | POA: Diagnosis not present

## 2016-04-12 DIAGNOSIS — I130 Hypertensive heart and chronic kidney disease with heart failure and stage 1 through stage 4 chronic kidney disease, or unspecified chronic kidney disease: Secondary | ICD-10-CM | POA: Diagnosis not present

## 2016-04-12 DIAGNOSIS — N183 Chronic kidney disease, stage 3 (moderate): Secondary | ICD-10-CM | POA: Diagnosis not present

## 2016-04-16 DIAGNOSIS — C7951 Secondary malignant neoplasm of bone: Secondary | ICD-10-CM | POA: Diagnosis not present

## 2016-04-16 DIAGNOSIS — I5032 Chronic diastolic (congestive) heart failure: Secondary | ICD-10-CM | POA: Diagnosis not present

## 2016-04-16 DIAGNOSIS — Z466 Encounter for fitting and adjustment of urinary device: Secondary | ICD-10-CM | POA: Diagnosis not present

## 2016-04-16 DIAGNOSIS — I2510 Atherosclerotic heart disease of native coronary artery without angina pectoris: Secondary | ICD-10-CM | POA: Diagnosis not present

## 2016-04-16 DIAGNOSIS — I130 Hypertensive heart and chronic kidney disease with heart failure and stage 1 through stage 4 chronic kidney disease, or unspecified chronic kidney disease: Secondary | ICD-10-CM | POA: Diagnosis not present

## 2016-04-16 DIAGNOSIS — D631 Anemia in chronic kidney disease: Secondary | ICD-10-CM | POA: Diagnosis not present

## 2016-04-16 DIAGNOSIS — L89154 Pressure ulcer of sacral region, stage 4: Secondary | ICD-10-CM | POA: Diagnosis not present

## 2016-04-16 DIAGNOSIS — N183 Chronic kidney disease, stage 3 (moderate): Secondary | ICD-10-CM | POA: Diagnosis not present

## 2016-04-16 DIAGNOSIS — I4891 Unspecified atrial fibrillation: Secondary | ICD-10-CM | POA: Diagnosis not present

## 2016-04-16 DIAGNOSIS — J9610 Chronic respiratory failure, unspecified whether with hypoxia or hypercapnia: Secondary | ICD-10-CM | POA: Diagnosis not present

## 2016-04-16 DIAGNOSIS — G8311 Monoplegia of lower limb affecting right dominant side: Secondary | ICD-10-CM | POA: Diagnosis not present

## 2016-04-16 DIAGNOSIS — C3490 Malignant neoplasm of unspecified part of unspecified bronchus or lung: Secondary | ICD-10-CM | POA: Diagnosis not present

## 2016-04-17 DIAGNOSIS — J9610 Chronic respiratory failure, unspecified whether with hypoxia or hypercapnia: Secondary | ICD-10-CM | POA: Diagnosis not present

## 2016-04-17 DIAGNOSIS — I4891 Unspecified atrial fibrillation: Secondary | ICD-10-CM | POA: Diagnosis not present

## 2016-04-17 DIAGNOSIS — D631 Anemia in chronic kidney disease: Secondary | ICD-10-CM | POA: Diagnosis not present

## 2016-04-17 DIAGNOSIS — C3490 Malignant neoplasm of unspecified part of unspecified bronchus or lung: Secondary | ICD-10-CM | POA: Diagnosis not present

## 2016-04-17 DIAGNOSIS — N183 Chronic kidney disease, stage 3 (moderate): Secondary | ICD-10-CM | POA: Diagnosis not present

## 2016-04-17 DIAGNOSIS — C7951 Secondary malignant neoplasm of bone: Secondary | ICD-10-CM | POA: Diagnosis not present

## 2016-04-17 DIAGNOSIS — L89154 Pressure ulcer of sacral region, stage 4: Secondary | ICD-10-CM | POA: Diagnosis not present

## 2016-04-17 DIAGNOSIS — I2510 Atherosclerotic heart disease of native coronary artery without angina pectoris: Secondary | ICD-10-CM | POA: Diagnosis not present

## 2016-04-17 DIAGNOSIS — I130 Hypertensive heart and chronic kidney disease with heart failure and stage 1 through stage 4 chronic kidney disease, or unspecified chronic kidney disease: Secondary | ICD-10-CM | POA: Diagnosis not present

## 2016-04-17 DIAGNOSIS — I5032 Chronic diastolic (congestive) heart failure: Secondary | ICD-10-CM | POA: Diagnosis not present

## 2016-04-17 DIAGNOSIS — G8311 Monoplegia of lower limb affecting right dominant side: Secondary | ICD-10-CM | POA: Diagnosis not present

## 2016-04-17 DIAGNOSIS — Z466 Encounter for fitting and adjustment of urinary device: Secondary | ICD-10-CM | POA: Diagnosis not present

## 2016-04-19 DIAGNOSIS — G8311 Monoplegia of lower limb affecting right dominant side: Secondary | ICD-10-CM | POA: Diagnosis not present

## 2016-04-19 DIAGNOSIS — I2510 Atherosclerotic heart disease of native coronary artery without angina pectoris: Secondary | ICD-10-CM | POA: Diagnosis not present

## 2016-04-19 DIAGNOSIS — D631 Anemia in chronic kidney disease: Secondary | ICD-10-CM | POA: Diagnosis not present

## 2016-04-19 DIAGNOSIS — C3490 Malignant neoplasm of unspecified part of unspecified bronchus or lung: Secondary | ICD-10-CM | POA: Diagnosis not present

## 2016-04-19 DIAGNOSIS — Z466 Encounter for fitting and adjustment of urinary device: Secondary | ICD-10-CM | POA: Diagnosis not present

## 2016-04-19 DIAGNOSIS — N183 Chronic kidney disease, stage 3 (moderate): Secondary | ICD-10-CM | POA: Diagnosis not present

## 2016-04-19 DIAGNOSIS — J9610 Chronic respiratory failure, unspecified whether with hypoxia or hypercapnia: Secondary | ICD-10-CM | POA: Diagnosis not present

## 2016-04-19 DIAGNOSIS — I130 Hypertensive heart and chronic kidney disease with heart failure and stage 1 through stage 4 chronic kidney disease, or unspecified chronic kidney disease: Secondary | ICD-10-CM | POA: Diagnosis not present

## 2016-04-19 DIAGNOSIS — C7951 Secondary malignant neoplasm of bone: Secondary | ICD-10-CM | POA: Diagnosis not present

## 2016-04-19 DIAGNOSIS — L89154 Pressure ulcer of sacral region, stage 4: Secondary | ICD-10-CM | POA: Diagnosis not present

## 2016-04-19 DIAGNOSIS — I5032 Chronic diastolic (congestive) heart failure: Secondary | ICD-10-CM | POA: Diagnosis not present

## 2016-04-19 DIAGNOSIS — I4891 Unspecified atrial fibrillation: Secondary | ICD-10-CM | POA: Diagnosis not present

## 2016-04-22 DIAGNOSIS — L89154 Pressure ulcer of sacral region, stage 4: Secondary | ICD-10-CM | POA: Diagnosis not present

## 2016-04-22 DIAGNOSIS — R222 Localized swelling, mass and lump, trunk: Secondary | ICD-10-CM | POA: Diagnosis not present

## 2016-04-23 DIAGNOSIS — Z466 Encounter for fitting and adjustment of urinary device: Secondary | ICD-10-CM | POA: Diagnosis not present

## 2016-04-23 DIAGNOSIS — I4891 Unspecified atrial fibrillation: Secondary | ICD-10-CM | POA: Diagnosis not present

## 2016-04-23 DIAGNOSIS — G8311 Monoplegia of lower limb affecting right dominant side: Secondary | ICD-10-CM | POA: Diagnosis not present

## 2016-04-23 DIAGNOSIS — I2510 Atherosclerotic heart disease of native coronary artery without angina pectoris: Secondary | ICD-10-CM | POA: Diagnosis not present

## 2016-04-23 DIAGNOSIS — C3490 Malignant neoplasm of unspecified part of unspecified bronchus or lung: Secondary | ICD-10-CM | POA: Diagnosis not present

## 2016-04-23 DIAGNOSIS — D631 Anemia in chronic kidney disease: Secondary | ICD-10-CM | POA: Diagnosis not present

## 2016-04-23 DIAGNOSIS — L89154 Pressure ulcer of sacral region, stage 4: Secondary | ICD-10-CM | POA: Diagnosis not present

## 2016-04-23 DIAGNOSIS — I5032 Chronic diastolic (congestive) heart failure: Secondary | ICD-10-CM | POA: Diagnosis not present

## 2016-04-23 DIAGNOSIS — N183 Chronic kidney disease, stage 3 (moderate): Secondary | ICD-10-CM | POA: Diagnosis not present

## 2016-04-23 DIAGNOSIS — J9610 Chronic respiratory failure, unspecified whether with hypoxia or hypercapnia: Secondary | ICD-10-CM | POA: Diagnosis not present

## 2016-04-23 DIAGNOSIS — I130 Hypertensive heart and chronic kidney disease with heart failure and stage 1 through stage 4 chronic kidney disease, or unspecified chronic kidney disease: Secondary | ICD-10-CM | POA: Diagnosis not present

## 2016-04-23 DIAGNOSIS — C7951 Secondary malignant neoplasm of bone: Secondary | ICD-10-CM | POA: Diagnosis not present

## 2016-04-26 DIAGNOSIS — L89154 Pressure ulcer of sacral region, stage 4: Secondary | ICD-10-CM | POA: Diagnosis not present

## 2016-04-26 DIAGNOSIS — I4891 Unspecified atrial fibrillation: Secondary | ICD-10-CM | POA: Diagnosis not present

## 2016-04-26 DIAGNOSIS — N183 Chronic kidney disease, stage 3 (moderate): Secondary | ICD-10-CM | POA: Diagnosis not present

## 2016-04-26 DIAGNOSIS — C3490 Malignant neoplasm of unspecified part of unspecified bronchus or lung: Secondary | ICD-10-CM | POA: Diagnosis not present

## 2016-04-26 DIAGNOSIS — Z466 Encounter for fitting and adjustment of urinary device: Secondary | ICD-10-CM | POA: Diagnosis not present

## 2016-04-26 DIAGNOSIS — I2510 Atherosclerotic heart disease of native coronary artery without angina pectoris: Secondary | ICD-10-CM | POA: Diagnosis not present

## 2016-04-26 DIAGNOSIS — J9610 Chronic respiratory failure, unspecified whether with hypoxia or hypercapnia: Secondary | ICD-10-CM | POA: Diagnosis not present

## 2016-04-26 DIAGNOSIS — D631 Anemia in chronic kidney disease: Secondary | ICD-10-CM | POA: Diagnosis not present

## 2016-04-26 DIAGNOSIS — I130 Hypertensive heart and chronic kidney disease with heart failure and stage 1 through stage 4 chronic kidney disease, or unspecified chronic kidney disease: Secondary | ICD-10-CM | POA: Diagnosis not present

## 2016-04-26 DIAGNOSIS — I5032 Chronic diastolic (congestive) heart failure: Secondary | ICD-10-CM | POA: Diagnosis not present

## 2016-04-26 DIAGNOSIS — G8311 Monoplegia of lower limb affecting right dominant side: Secondary | ICD-10-CM | POA: Diagnosis not present

## 2016-04-26 DIAGNOSIS — C7951 Secondary malignant neoplasm of bone: Secondary | ICD-10-CM | POA: Diagnosis not present

## 2016-04-29 DIAGNOSIS — D631 Anemia in chronic kidney disease: Secondary | ICD-10-CM | POA: Diagnosis not present

## 2016-04-29 DIAGNOSIS — L89154 Pressure ulcer of sacral region, stage 4: Secondary | ICD-10-CM | POA: Diagnosis not present

## 2016-04-29 DIAGNOSIS — J9610 Chronic respiratory failure, unspecified whether with hypoxia or hypercapnia: Secondary | ICD-10-CM | POA: Diagnosis not present

## 2016-04-29 DIAGNOSIS — Z466 Encounter for fitting and adjustment of urinary device: Secondary | ICD-10-CM | POA: Diagnosis not present

## 2016-04-29 DIAGNOSIS — G8311 Monoplegia of lower limb affecting right dominant side: Secondary | ICD-10-CM | POA: Diagnosis not present

## 2016-04-29 DIAGNOSIS — N183 Chronic kidney disease, stage 3 (moderate): Secondary | ICD-10-CM | POA: Diagnosis not present

## 2016-04-29 DIAGNOSIS — C7951 Secondary malignant neoplasm of bone: Secondary | ICD-10-CM | POA: Diagnosis not present

## 2016-04-29 DIAGNOSIS — I5032 Chronic diastolic (congestive) heart failure: Secondary | ICD-10-CM | POA: Diagnosis not present

## 2016-04-29 DIAGNOSIS — C3490 Malignant neoplasm of unspecified part of unspecified bronchus or lung: Secondary | ICD-10-CM | POA: Diagnosis not present

## 2016-04-29 DIAGNOSIS — I4891 Unspecified atrial fibrillation: Secondary | ICD-10-CM | POA: Diagnosis not present

## 2016-04-29 DIAGNOSIS — I2510 Atherosclerotic heart disease of native coronary artery without angina pectoris: Secondary | ICD-10-CM | POA: Diagnosis not present

## 2016-04-29 DIAGNOSIS — I130 Hypertensive heart and chronic kidney disease with heart failure and stage 1 through stage 4 chronic kidney disease, or unspecified chronic kidney disease: Secondary | ICD-10-CM | POA: Diagnosis not present

## 2016-04-30 DIAGNOSIS — C3490 Malignant neoplasm of unspecified part of unspecified bronchus or lung: Secondary | ICD-10-CM | POA: Diagnosis not present

## 2016-04-30 DIAGNOSIS — D631 Anemia in chronic kidney disease: Secondary | ICD-10-CM | POA: Diagnosis not present

## 2016-04-30 DIAGNOSIS — I2510 Atherosclerotic heart disease of native coronary artery without angina pectoris: Secondary | ICD-10-CM | POA: Diagnosis not present

## 2016-04-30 DIAGNOSIS — I130 Hypertensive heart and chronic kidney disease with heart failure and stage 1 through stage 4 chronic kidney disease, or unspecified chronic kidney disease: Secondary | ICD-10-CM | POA: Diagnosis not present

## 2016-04-30 DIAGNOSIS — N183 Chronic kidney disease, stage 3 (moderate): Secondary | ICD-10-CM | POA: Diagnosis not present

## 2016-04-30 DIAGNOSIS — J9610 Chronic respiratory failure, unspecified whether with hypoxia or hypercapnia: Secondary | ICD-10-CM | POA: Diagnosis not present

## 2016-04-30 DIAGNOSIS — G8311 Monoplegia of lower limb affecting right dominant side: Secondary | ICD-10-CM | POA: Diagnosis not present

## 2016-04-30 DIAGNOSIS — C7951 Secondary malignant neoplasm of bone: Secondary | ICD-10-CM | POA: Diagnosis not present

## 2016-04-30 DIAGNOSIS — Z466 Encounter for fitting and adjustment of urinary device: Secondary | ICD-10-CM | POA: Diagnosis not present

## 2016-04-30 DIAGNOSIS — I5032 Chronic diastolic (congestive) heart failure: Secondary | ICD-10-CM | POA: Diagnosis not present

## 2016-04-30 DIAGNOSIS — L89154 Pressure ulcer of sacral region, stage 4: Secondary | ICD-10-CM | POA: Diagnosis not present

## 2016-04-30 DIAGNOSIS — I4891 Unspecified atrial fibrillation: Secondary | ICD-10-CM | POA: Diagnosis not present

## 2016-05-01 DIAGNOSIS — J9610 Chronic respiratory failure, unspecified whether with hypoxia or hypercapnia: Secondary | ICD-10-CM | POA: Diagnosis not present

## 2016-05-01 DIAGNOSIS — L89154 Pressure ulcer of sacral region, stage 4: Secondary | ICD-10-CM | POA: Diagnosis not present

## 2016-05-01 DIAGNOSIS — C3490 Malignant neoplasm of unspecified part of unspecified bronchus or lung: Secondary | ICD-10-CM | POA: Diagnosis not present

## 2016-05-01 DIAGNOSIS — Z466 Encounter for fitting and adjustment of urinary device: Secondary | ICD-10-CM | POA: Diagnosis not present

## 2016-05-01 DIAGNOSIS — C7951 Secondary malignant neoplasm of bone: Secondary | ICD-10-CM | POA: Diagnosis not present

## 2016-05-01 DIAGNOSIS — I4891 Unspecified atrial fibrillation: Secondary | ICD-10-CM | POA: Diagnosis not present

## 2016-05-01 DIAGNOSIS — I5032 Chronic diastolic (congestive) heart failure: Secondary | ICD-10-CM | POA: Diagnosis not present

## 2016-05-01 DIAGNOSIS — I2510 Atherosclerotic heart disease of native coronary artery without angina pectoris: Secondary | ICD-10-CM | POA: Diagnosis not present

## 2016-05-01 DIAGNOSIS — D631 Anemia in chronic kidney disease: Secondary | ICD-10-CM | POA: Diagnosis not present

## 2016-05-01 DIAGNOSIS — N183 Chronic kidney disease, stage 3 (moderate): Secondary | ICD-10-CM | POA: Diagnosis not present

## 2016-05-01 DIAGNOSIS — I130 Hypertensive heart and chronic kidney disease with heart failure and stage 1 through stage 4 chronic kidney disease, or unspecified chronic kidney disease: Secondary | ICD-10-CM | POA: Diagnosis not present

## 2016-05-01 DIAGNOSIS — G8311 Monoplegia of lower limb affecting right dominant side: Secondary | ICD-10-CM | POA: Diagnosis not present

## 2016-05-03 DIAGNOSIS — I5032 Chronic diastolic (congestive) heart failure: Secondary | ICD-10-CM | POA: Diagnosis not present

## 2016-05-03 DIAGNOSIS — I130 Hypertensive heart and chronic kidney disease with heart failure and stage 1 through stage 4 chronic kidney disease, or unspecified chronic kidney disease: Secondary | ICD-10-CM | POA: Diagnosis not present

## 2016-05-03 DIAGNOSIS — N183 Chronic kidney disease, stage 3 (moderate): Secondary | ICD-10-CM | POA: Diagnosis not present

## 2016-05-03 DIAGNOSIS — J9610 Chronic respiratory failure, unspecified whether with hypoxia or hypercapnia: Secondary | ICD-10-CM | POA: Diagnosis not present

## 2016-05-03 DIAGNOSIS — D631 Anemia in chronic kidney disease: Secondary | ICD-10-CM | POA: Diagnosis not present

## 2016-05-03 DIAGNOSIS — C7951 Secondary malignant neoplasm of bone: Secondary | ICD-10-CM | POA: Diagnosis not present

## 2016-05-03 DIAGNOSIS — L89154 Pressure ulcer of sacral region, stage 4: Secondary | ICD-10-CM | POA: Diagnosis not present

## 2016-05-03 DIAGNOSIS — G8311 Monoplegia of lower limb affecting right dominant side: Secondary | ICD-10-CM | POA: Diagnosis not present

## 2016-05-03 DIAGNOSIS — I2510 Atherosclerotic heart disease of native coronary artery without angina pectoris: Secondary | ICD-10-CM | POA: Diagnosis not present

## 2016-05-03 DIAGNOSIS — I4891 Unspecified atrial fibrillation: Secondary | ICD-10-CM | POA: Diagnosis not present

## 2016-05-03 DIAGNOSIS — C3490 Malignant neoplasm of unspecified part of unspecified bronchus or lung: Secondary | ICD-10-CM | POA: Diagnosis not present

## 2016-05-03 DIAGNOSIS — Z466 Encounter for fitting and adjustment of urinary device: Secondary | ICD-10-CM | POA: Diagnosis not present

## 2016-05-06 DIAGNOSIS — Z466 Encounter for fitting and adjustment of urinary device: Secondary | ICD-10-CM | POA: Diagnosis not present

## 2016-05-06 DIAGNOSIS — D631 Anemia in chronic kidney disease: Secondary | ICD-10-CM | POA: Diagnosis not present

## 2016-05-06 DIAGNOSIS — G8311 Monoplegia of lower limb affecting right dominant side: Secondary | ICD-10-CM | POA: Diagnosis not present

## 2016-05-06 DIAGNOSIS — C3490 Malignant neoplasm of unspecified part of unspecified bronchus or lung: Secondary | ICD-10-CM | POA: Diagnosis not present

## 2016-05-06 DIAGNOSIS — J9610 Chronic respiratory failure, unspecified whether with hypoxia or hypercapnia: Secondary | ICD-10-CM | POA: Diagnosis not present

## 2016-05-06 DIAGNOSIS — L89154 Pressure ulcer of sacral region, stage 4: Secondary | ICD-10-CM | POA: Diagnosis not present

## 2016-05-06 DIAGNOSIS — C7951 Secondary malignant neoplasm of bone: Secondary | ICD-10-CM | POA: Diagnosis not present

## 2016-05-06 DIAGNOSIS — I4891 Unspecified atrial fibrillation: Secondary | ICD-10-CM | POA: Diagnosis not present

## 2016-05-06 DIAGNOSIS — I5032 Chronic diastolic (congestive) heart failure: Secondary | ICD-10-CM | POA: Diagnosis not present

## 2016-05-06 DIAGNOSIS — N183 Chronic kidney disease, stage 3 (moderate): Secondary | ICD-10-CM | POA: Diagnosis not present

## 2016-05-06 DIAGNOSIS — I2510 Atherosclerotic heart disease of native coronary artery without angina pectoris: Secondary | ICD-10-CM | POA: Diagnosis not present

## 2016-05-06 DIAGNOSIS — I130 Hypertensive heart and chronic kidney disease with heart failure and stage 1 through stage 4 chronic kidney disease, or unspecified chronic kidney disease: Secondary | ICD-10-CM | POA: Diagnosis not present

## 2016-05-07 ENCOUNTER — Telehealth: Payer: Self-pay | Admitting: *Deleted

## 2016-05-07 DIAGNOSIS — C3490 Malignant neoplasm of unspecified part of unspecified bronchus or lung: Secondary | ICD-10-CM | POA: Diagnosis not present

## 2016-05-07 DIAGNOSIS — L89154 Pressure ulcer of sacral region, stage 4: Secondary | ICD-10-CM | POA: Diagnosis not present

## 2016-05-07 DIAGNOSIS — I509 Heart failure, unspecified: Secondary | ICD-10-CM | POA: Diagnosis not present

## 2016-05-07 DIAGNOSIS — D631 Anemia in chronic kidney disease: Secondary | ICD-10-CM | POA: Diagnosis not present

## 2016-05-07 DIAGNOSIS — I5032 Chronic diastolic (congestive) heart failure: Secondary | ICD-10-CM | POA: Diagnosis not present

## 2016-05-07 DIAGNOSIS — N183 Chronic kidney disease, stage 3 (moderate): Secondary | ICD-10-CM | POA: Diagnosis not present

## 2016-05-07 DIAGNOSIS — C7951 Secondary malignant neoplasm of bone: Secondary | ICD-10-CM | POA: Diagnosis not present

## 2016-05-07 DIAGNOSIS — I130 Hypertensive heart and chronic kidney disease with heart failure and stage 1 through stage 4 chronic kidney disease, or unspecified chronic kidney disease: Secondary | ICD-10-CM | POA: Diagnosis not present

## 2016-05-07 DIAGNOSIS — G8311 Monoplegia of lower limb affecting right dominant side: Secondary | ICD-10-CM | POA: Diagnosis not present

## 2016-05-07 DIAGNOSIS — I8291 Chronic embolism and thrombosis of unspecified vein: Secondary | ICD-10-CM | POA: Diagnosis not present

## 2016-05-07 DIAGNOSIS — L89153 Pressure ulcer of sacral region, stage 3: Secondary | ICD-10-CM | POA: Diagnosis not present

## 2016-05-07 DIAGNOSIS — Z466 Encounter for fitting and adjustment of urinary device: Secondary | ICD-10-CM | POA: Diagnosis not present

## 2016-05-07 DIAGNOSIS — I4891 Unspecified atrial fibrillation: Secondary | ICD-10-CM | POA: Diagnosis not present

## 2016-05-07 DIAGNOSIS — J9610 Chronic respiratory failure, unspecified whether with hypoxia or hypercapnia: Secondary | ICD-10-CM | POA: Diagnosis not present

## 2016-05-07 DIAGNOSIS — I2510 Atherosclerotic heart disease of native coronary artery without angina pectoris: Secondary | ICD-10-CM | POA: Diagnosis not present

## 2016-05-07 DIAGNOSIS — D0220 Carcinoma in situ of unspecified bronchus and lung: Secondary | ICD-10-CM | POA: Diagnosis not present

## 2016-05-07 NOTE — Telephone Encounter (Signed)
"  I have not yet received the flu vaccine.  I have transportation to come to [facility] tomorrow morning and would like this to be given when I come in for the flush.  Please call me back today to let me know if I can receive it while there.  Return number [phone].  I'm paralyzed and do not have transportation to get to a doctor or pharmacy to receive this so since I have transportation tomorrow is why this will be great."

## 2016-05-08 ENCOUNTER — Ambulatory Visit (HOSPITAL_BASED_OUTPATIENT_CLINIC_OR_DEPARTMENT_OTHER): Payer: Medicare Other

## 2016-05-08 DIAGNOSIS — Z95828 Presence of other vascular implants and grafts: Secondary | ICD-10-CM

## 2016-05-08 DIAGNOSIS — C7951 Secondary malignant neoplasm of bone: Secondary | ICD-10-CM | POA: Diagnosis not present

## 2016-05-08 DIAGNOSIS — Z23 Encounter for immunization: Secondary | ICD-10-CM

## 2016-05-08 MED ORDER — INFLUENZA VAC SPLIT QUAD 0.5 ML IM SUSY
0.5000 mL | PREFILLED_SYRINGE | Freq: Once | INTRAMUSCULAR | Status: AC
  Administered 2016-05-08: 0.5 mL via INTRAMUSCULAR
  Filled 2016-05-08: qty 0.5

## 2016-05-08 MED ORDER — HEPARIN SOD (PORK) LOCK FLUSH 100 UNIT/ML IV SOLN
500.0000 [IU] | Freq: Once | INTRAVENOUS | Status: AC | PRN
  Administered 2016-05-08: 500 [IU] via INTRAVENOUS
  Filled 2016-05-08: qty 5

## 2016-05-08 MED ORDER — SODIUM CHLORIDE 0.9% FLUSH
10.0000 mL | INTRAVENOUS | Status: DC | PRN
  Administered 2016-05-08: 10 mL via INTRAVENOUS
  Filled 2016-05-08: qty 10

## 2016-05-09 DIAGNOSIS — I2510 Atherosclerotic heart disease of native coronary artery without angina pectoris: Secondary | ICD-10-CM | POA: Diagnosis not present

## 2016-05-09 DIAGNOSIS — N183 Chronic kidney disease, stage 3 (moderate): Secondary | ICD-10-CM | POA: Diagnosis not present

## 2016-05-09 DIAGNOSIS — J9610 Chronic respiratory failure, unspecified whether with hypoxia or hypercapnia: Secondary | ICD-10-CM | POA: Diagnosis not present

## 2016-05-09 DIAGNOSIS — I130 Hypertensive heart and chronic kidney disease with heart failure and stage 1 through stage 4 chronic kidney disease, or unspecified chronic kidney disease: Secondary | ICD-10-CM | POA: Diagnosis not present

## 2016-05-09 DIAGNOSIS — I5032 Chronic diastolic (congestive) heart failure: Secondary | ICD-10-CM | POA: Diagnosis not present

## 2016-05-09 DIAGNOSIS — Z466 Encounter for fitting and adjustment of urinary device: Secondary | ICD-10-CM | POA: Diagnosis not present

## 2016-05-09 DIAGNOSIS — C7951 Secondary malignant neoplasm of bone: Secondary | ICD-10-CM | POA: Diagnosis not present

## 2016-05-09 DIAGNOSIS — D631 Anemia in chronic kidney disease: Secondary | ICD-10-CM | POA: Diagnosis not present

## 2016-05-09 DIAGNOSIS — I4891 Unspecified atrial fibrillation: Secondary | ICD-10-CM | POA: Diagnosis not present

## 2016-05-09 DIAGNOSIS — G8311 Monoplegia of lower limb affecting right dominant side: Secondary | ICD-10-CM | POA: Diagnosis not present

## 2016-05-09 DIAGNOSIS — L89154 Pressure ulcer of sacral region, stage 4: Secondary | ICD-10-CM | POA: Diagnosis not present

## 2016-05-09 DIAGNOSIS — C3490 Malignant neoplasm of unspecified part of unspecified bronchus or lung: Secondary | ICD-10-CM | POA: Diagnosis not present

## 2016-05-13 DIAGNOSIS — D631 Anemia in chronic kidney disease: Secondary | ICD-10-CM | POA: Diagnosis not present

## 2016-05-13 DIAGNOSIS — C7951 Secondary malignant neoplasm of bone: Secondary | ICD-10-CM | POA: Diagnosis not present

## 2016-05-13 DIAGNOSIS — N183 Chronic kidney disease, stage 3 (moderate): Secondary | ICD-10-CM | POA: Diagnosis not present

## 2016-05-13 DIAGNOSIS — I5032 Chronic diastolic (congestive) heart failure: Secondary | ICD-10-CM | POA: Diagnosis not present

## 2016-05-13 DIAGNOSIS — I130 Hypertensive heart and chronic kidney disease with heart failure and stage 1 through stage 4 chronic kidney disease, or unspecified chronic kidney disease: Secondary | ICD-10-CM | POA: Diagnosis not present

## 2016-05-13 DIAGNOSIS — Z466 Encounter for fitting and adjustment of urinary device: Secondary | ICD-10-CM | POA: Diagnosis not present

## 2016-05-13 DIAGNOSIS — L89154 Pressure ulcer of sacral region, stage 4: Secondary | ICD-10-CM | POA: Diagnosis not present

## 2016-05-13 DIAGNOSIS — C3490 Malignant neoplasm of unspecified part of unspecified bronchus or lung: Secondary | ICD-10-CM | POA: Diagnosis not present

## 2016-05-13 DIAGNOSIS — G8311 Monoplegia of lower limb affecting right dominant side: Secondary | ICD-10-CM | POA: Diagnosis not present

## 2016-05-13 DIAGNOSIS — I2510 Atherosclerotic heart disease of native coronary artery without angina pectoris: Secondary | ICD-10-CM | POA: Diagnosis not present

## 2016-05-13 DIAGNOSIS — J9610 Chronic respiratory failure, unspecified whether with hypoxia or hypercapnia: Secondary | ICD-10-CM | POA: Diagnosis not present

## 2016-05-13 DIAGNOSIS — I4891 Unspecified atrial fibrillation: Secondary | ICD-10-CM | POA: Diagnosis not present

## 2016-05-14 DIAGNOSIS — I2510 Atherosclerotic heart disease of native coronary artery without angina pectoris: Secondary | ICD-10-CM | POA: Diagnosis not present

## 2016-05-14 DIAGNOSIS — J9610 Chronic respiratory failure, unspecified whether with hypoxia or hypercapnia: Secondary | ICD-10-CM | POA: Diagnosis not present

## 2016-05-14 DIAGNOSIS — G8311 Monoplegia of lower limb affecting right dominant side: Secondary | ICD-10-CM | POA: Diagnosis not present

## 2016-05-14 DIAGNOSIS — D631 Anemia in chronic kidney disease: Secondary | ICD-10-CM | POA: Diagnosis not present

## 2016-05-14 DIAGNOSIS — I130 Hypertensive heart and chronic kidney disease with heart failure and stage 1 through stage 4 chronic kidney disease, or unspecified chronic kidney disease: Secondary | ICD-10-CM | POA: Diagnosis not present

## 2016-05-14 DIAGNOSIS — C3490 Malignant neoplasm of unspecified part of unspecified bronchus or lung: Secondary | ICD-10-CM | POA: Diagnosis not present

## 2016-05-14 DIAGNOSIS — I5032 Chronic diastolic (congestive) heart failure: Secondary | ICD-10-CM | POA: Diagnosis not present

## 2016-05-14 DIAGNOSIS — I4891 Unspecified atrial fibrillation: Secondary | ICD-10-CM | POA: Diagnosis not present

## 2016-05-14 DIAGNOSIS — Z466 Encounter for fitting and adjustment of urinary device: Secondary | ICD-10-CM | POA: Diagnosis not present

## 2016-05-14 DIAGNOSIS — N183 Chronic kidney disease, stage 3 (moderate): Secondary | ICD-10-CM | POA: Diagnosis not present

## 2016-05-14 DIAGNOSIS — C7951 Secondary malignant neoplasm of bone: Secondary | ICD-10-CM | POA: Diagnosis not present

## 2016-05-14 DIAGNOSIS — L89154 Pressure ulcer of sacral region, stage 4: Secondary | ICD-10-CM | POA: Diagnosis not present

## 2016-05-17 DIAGNOSIS — C3490 Malignant neoplasm of unspecified part of unspecified bronchus or lung: Secondary | ICD-10-CM | POA: Diagnosis not present

## 2016-05-17 DIAGNOSIS — I130 Hypertensive heart and chronic kidney disease with heart failure and stage 1 through stage 4 chronic kidney disease, or unspecified chronic kidney disease: Secondary | ICD-10-CM | POA: Diagnosis not present

## 2016-05-17 DIAGNOSIS — G8311 Monoplegia of lower limb affecting right dominant side: Secondary | ICD-10-CM | POA: Diagnosis not present

## 2016-05-17 DIAGNOSIS — Z466 Encounter for fitting and adjustment of urinary device: Secondary | ICD-10-CM | POA: Diagnosis not present

## 2016-05-17 DIAGNOSIS — I4891 Unspecified atrial fibrillation: Secondary | ICD-10-CM | POA: Diagnosis not present

## 2016-05-17 DIAGNOSIS — J9610 Chronic respiratory failure, unspecified whether with hypoxia or hypercapnia: Secondary | ICD-10-CM | POA: Diagnosis not present

## 2016-05-17 DIAGNOSIS — L89154 Pressure ulcer of sacral region, stage 4: Secondary | ICD-10-CM | POA: Diagnosis not present

## 2016-05-17 DIAGNOSIS — I2510 Atherosclerotic heart disease of native coronary artery without angina pectoris: Secondary | ICD-10-CM | POA: Diagnosis not present

## 2016-05-17 DIAGNOSIS — D631 Anemia in chronic kidney disease: Secondary | ICD-10-CM | POA: Diagnosis not present

## 2016-05-17 DIAGNOSIS — I5032 Chronic diastolic (congestive) heart failure: Secondary | ICD-10-CM | POA: Diagnosis not present

## 2016-05-17 DIAGNOSIS — C7951 Secondary malignant neoplasm of bone: Secondary | ICD-10-CM | POA: Diagnosis not present

## 2016-05-17 DIAGNOSIS — N183 Chronic kidney disease, stage 3 (moderate): Secondary | ICD-10-CM | POA: Diagnosis not present

## 2016-05-21 DIAGNOSIS — I4891 Unspecified atrial fibrillation: Secondary | ICD-10-CM | POA: Diagnosis not present

## 2016-05-21 DIAGNOSIS — C7951 Secondary malignant neoplasm of bone: Secondary | ICD-10-CM | POA: Diagnosis not present

## 2016-05-21 DIAGNOSIS — J9610 Chronic respiratory failure, unspecified whether with hypoxia or hypercapnia: Secondary | ICD-10-CM | POA: Diagnosis not present

## 2016-05-21 DIAGNOSIS — N183 Chronic kidney disease, stage 3 (moderate): Secondary | ICD-10-CM | POA: Diagnosis not present

## 2016-05-21 DIAGNOSIS — I130 Hypertensive heart and chronic kidney disease with heart failure and stage 1 through stage 4 chronic kidney disease, or unspecified chronic kidney disease: Secondary | ICD-10-CM | POA: Diagnosis not present

## 2016-05-21 DIAGNOSIS — I5032 Chronic diastolic (congestive) heart failure: Secondary | ICD-10-CM | POA: Diagnosis not present

## 2016-05-21 DIAGNOSIS — D631 Anemia in chronic kidney disease: Secondary | ICD-10-CM | POA: Diagnosis not present

## 2016-05-21 DIAGNOSIS — C3490 Malignant neoplasm of unspecified part of unspecified bronchus or lung: Secondary | ICD-10-CM | POA: Diagnosis not present

## 2016-05-21 DIAGNOSIS — I2510 Atherosclerotic heart disease of native coronary artery without angina pectoris: Secondary | ICD-10-CM | POA: Diagnosis not present

## 2016-05-21 DIAGNOSIS — Z466 Encounter for fitting and adjustment of urinary device: Secondary | ICD-10-CM | POA: Diagnosis not present

## 2016-05-21 DIAGNOSIS — G8311 Monoplegia of lower limb affecting right dominant side: Secondary | ICD-10-CM | POA: Diagnosis not present

## 2016-05-21 DIAGNOSIS — L89154 Pressure ulcer of sacral region, stage 4: Secondary | ICD-10-CM | POA: Diagnosis not present

## 2016-05-22 DIAGNOSIS — D631 Anemia in chronic kidney disease: Secondary | ICD-10-CM | POA: Diagnosis not present

## 2016-05-22 DIAGNOSIS — N183 Chronic kidney disease, stage 3 (moderate): Secondary | ICD-10-CM | POA: Diagnosis not present

## 2016-05-22 DIAGNOSIS — J9610 Chronic respiratory failure, unspecified whether with hypoxia or hypercapnia: Secondary | ICD-10-CM | POA: Diagnosis not present

## 2016-05-22 DIAGNOSIS — I130 Hypertensive heart and chronic kidney disease with heart failure and stage 1 through stage 4 chronic kidney disease, or unspecified chronic kidney disease: Secondary | ICD-10-CM | POA: Diagnosis not present

## 2016-05-22 DIAGNOSIS — G8311 Monoplegia of lower limb affecting right dominant side: Secondary | ICD-10-CM | POA: Diagnosis not present

## 2016-05-22 DIAGNOSIS — I2510 Atherosclerotic heart disease of native coronary artery without angina pectoris: Secondary | ICD-10-CM | POA: Diagnosis not present

## 2016-05-22 DIAGNOSIS — L89154 Pressure ulcer of sacral region, stage 4: Secondary | ICD-10-CM | POA: Diagnosis not present

## 2016-05-22 DIAGNOSIS — C3490 Malignant neoplasm of unspecified part of unspecified bronchus or lung: Secondary | ICD-10-CM | POA: Diagnosis not present

## 2016-05-22 DIAGNOSIS — C7951 Secondary malignant neoplasm of bone: Secondary | ICD-10-CM | POA: Diagnosis not present

## 2016-05-22 DIAGNOSIS — Z466 Encounter for fitting and adjustment of urinary device: Secondary | ICD-10-CM | POA: Diagnosis not present

## 2016-05-22 DIAGNOSIS — I5032 Chronic diastolic (congestive) heart failure: Secondary | ICD-10-CM | POA: Diagnosis not present

## 2016-05-22 DIAGNOSIS — I4891 Unspecified atrial fibrillation: Secondary | ICD-10-CM | POA: Diagnosis not present

## 2016-05-24 DIAGNOSIS — Z466 Encounter for fitting and adjustment of urinary device: Secondary | ICD-10-CM | POA: Diagnosis not present

## 2016-05-24 DIAGNOSIS — N183 Chronic kidney disease, stage 3 (moderate): Secondary | ICD-10-CM | POA: Diagnosis not present

## 2016-05-24 DIAGNOSIS — I4891 Unspecified atrial fibrillation: Secondary | ICD-10-CM | POA: Diagnosis not present

## 2016-05-24 DIAGNOSIS — I5032 Chronic diastolic (congestive) heart failure: Secondary | ICD-10-CM | POA: Diagnosis not present

## 2016-05-24 DIAGNOSIS — G8311 Monoplegia of lower limb affecting right dominant side: Secondary | ICD-10-CM | POA: Diagnosis not present

## 2016-05-24 DIAGNOSIS — I130 Hypertensive heart and chronic kidney disease with heart failure and stage 1 through stage 4 chronic kidney disease, or unspecified chronic kidney disease: Secondary | ICD-10-CM | POA: Diagnosis not present

## 2016-05-24 DIAGNOSIS — D631 Anemia in chronic kidney disease: Secondary | ICD-10-CM | POA: Diagnosis not present

## 2016-05-24 DIAGNOSIS — L89154 Pressure ulcer of sacral region, stage 4: Secondary | ICD-10-CM | POA: Diagnosis not present

## 2016-05-24 DIAGNOSIS — I2510 Atherosclerotic heart disease of native coronary artery without angina pectoris: Secondary | ICD-10-CM | POA: Diagnosis not present

## 2016-05-24 DIAGNOSIS — C3490 Malignant neoplasm of unspecified part of unspecified bronchus or lung: Secondary | ICD-10-CM | POA: Diagnosis not present

## 2016-05-24 DIAGNOSIS — C7951 Secondary malignant neoplasm of bone: Secondary | ICD-10-CM | POA: Diagnosis not present

## 2016-05-24 DIAGNOSIS — J9610 Chronic respiratory failure, unspecified whether with hypoxia or hypercapnia: Secondary | ICD-10-CM | POA: Diagnosis not present

## 2016-05-25 DIAGNOSIS — C3490 Malignant neoplasm of unspecified part of unspecified bronchus or lung: Secondary | ICD-10-CM | POA: Diagnosis not present

## 2016-05-25 DIAGNOSIS — C7951 Secondary malignant neoplasm of bone: Secondary | ICD-10-CM | POA: Diagnosis not present

## 2016-05-25 DIAGNOSIS — I4891 Unspecified atrial fibrillation: Secondary | ICD-10-CM | POA: Diagnosis not present

## 2016-05-25 DIAGNOSIS — Z466 Encounter for fitting and adjustment of urinary device: Secondary | ICD-10-CM | POA: Diagnosis not present

## 2016-05-25 DIAGNOSIS — N183 Chronic kidney disease, stage 3 (moderate): Secondary | ICD-10-CM | POA: Diagnosis not present

## 2016-05-25 DIAGNOSIS — J9610 Chronic respiratory failure, unspecified whether with hypoxia or hypercapnia: Secondary | ICD-10-CM | POA: Diagnosis not present

## 2016-05-25 DIAGNOSIS — D631 Anemia in chronic kidney disease: Secondary | ICD-10-CM | POA: Diagnosis not present

## 2016-05-25 DIAGNOSIS — I130 Hypertensive heart and chronic kidney disease with heart failure and stage 1 through stage 4 chronic kidney disease, or unspecified chronic kidney disease: Secondary | ICD-10-CM | POA: Diagnosis not present

## 2016-05-25 DIAGNOSIS — I5032 Chronic diastolic (congestive) heart failure: Secondary | ICD-10-CM | POA: Diagnosis not present

## 2016-05-25 DIAGNOSIS — G8311 Monoplegia of lower limb affecting right dominant side: Secondary | ICD-10-CM | POA: Diagnosis not present

## 2016-05-25 DIAGNOSIS — I2510 Atherosclerotic heart disease of native coronary artery without angina pectoris: Secondary | ICD-10-CM | POA: Diagnosis not present

## 2016-05-25 DIAGNOSIS — L89154 Pressure ulcer of sacral region, stage 4: Secondary | ICD-10-CM | POA: Diagnosis not present

## 2016-05-28 DIAGNOSIS — I2510 Atherosclerotic heart disease of native coronary artery without angina pectoris: Secondary | ICD-10-CM | POA: Diagnosis not present

## 2016-05-28 DIAGNOSIS — J9610 Chronic respiratory failure, unspecified whether with hypoxia or hypercapnia: Secondary | ICD-10-CM | POA: Diagnosis not present

## 2016-05-28 DIAGNOSIS — Z466 Encounter for fitting and adjustment of urinary device: Secondary | ICD-10-CM | POA: Diagnosis not present

## 2016-05-28 DIAGNOSIS — D631 Anemia in chronic kidney disease: Secondary | ICD-10-CM | POA: Diagnosis not present

## 2016-05-28 DIAGNOSIS — C3490 Malignant neoplasm of unspecified part of unspecified bronchus or lung: Secondary | ICD-10-CM | POA: Diagnosis not present

## 2016-05-28 DIAGNOSIS — I130 Hypertensive heart and chronic kidney disease with heart failure and stage 1 through stage 4 chronic kidney disease, or unspecified chronic kidney disease: Secondary | ICD-10-CM | POA: Diagnosis not present

## 2016-05-28 DIAGNOSIS — G8311 Monoplegia of lower limb affecting right dominant side: Secondary | ICD-10-CM | POA: Diagnosis not present

## 2016-05-28 DIAGNOSIS — L89154 Pressure ulcer of sacral region, stage 4: Secondary | ICD-10-CM | POA: Diagnosis not present

## 2016-05-28 DIAGNOSIS — I4891 Unspecified atrial fibrillation: Secondary | ICD-10-CM | POA: Diagnosis not present

## 2016-05-28 DIAGNOSIS — N183 Chronic kidney disease, stage 3 (moderate): Secondary | ICD-10-CM | POA: Diagnosis not present

## 2016-05-28 DIAGNOSIS — I5032 Chronic diastolic (congestive) heart failure: Secondary | ICD-10-CM | POA: Diagnosis not present

## 2016-05-28 DIAGNOSIS — C7951 Secondary malignant neoplasm of bone: Secondary | ICD-10-CM | POA: Diagnosis not present

## 2016-05-29 DIAGNOSIS — I5032 Chronic diastolic (congestive) heart failure: Secondary | ICD-10-CM | POA: Diagnosis not present

## 2016-05-29 DIAGNOSIS — G8311 Monoplegia of lower limb affecting right dominant side: Secondary | ICD-10-CM | POA: Diagnosis not present

## 2016-05-29 DIAGNOSIS — Z466 Encounter for fitting and adjustment of urinary device: Secondary | ICD-10-CM | POA: Diagnosis not present

## 2016-05-29 DIAGNOSIS — C7951 Secondary malignant neoplasm of bone: Secondary | ICD-10-CM | POA: Diagnosis not present

## 2016-05-29 DIAGNOSIS — J9610 Chronic respiratory failure, unspecified whether with hypoxia or hypercapnia: Secondary | ICD-10-CM | POA: Diagnosis not present

## 2016-05-29 DIAGNOSIS — N183 Chronic kidney disease, stage 3 (moderate): Secondary | ICD-10-CM | POA: Diagnosis not present

## 2016-05-29 DIAGNOSIS — L89154 Pressure ulcer of sacral region, stage 4: Secondary | ICD-10-CM | POA: Diagnosis not present

## 2016-05-29 DIAGNOSIS — C3490 Malignant neoplasm of unspecified part of unspecified bronchus or lung: Secondary | ICD-10-CM | POA: Diagnosis not present

## 2016-05-29 DIAGNOSIS — I2510 Atherosclerotic heart disease of native coronary artery without angina pectoris: Secondary | ICD-10-CM | POA: Diagnosis not present

## 2016-05-29 DIAGNOSIS — I4891 Unspecified atrial fibrillation: Secondary | ICD-10-CM | POA: Diagnosis not present

## 2016-05-29 DIAGNOSIS — I130 Hypertensive heart and chronic kidney disease with heart failure and stage 1 through stage 4 chronic kidney disease, or unspecified chronic kidney disease: Secondary | ICD-10-CM | POA: Diagnosis not present

## 2016-05-29 DIAGNOSIS — D631 Anemia in chronic kidney disease: Secondary | ICD-10-CM | POA: Diagnosis not present

## 2016-05-30 DIAGNOSIS — N183 Chronic kidney disease, stage 3 (moderate): Secondary | ICD-10-CM | POA: Diagnosis not present

## 2016-05-30 DIAGNOSIS — G8311 Monoplegia of lower limb affecting right dominant side: Secondary | ICD-10-CM | POA: Diagnosis not present

## 2016-05-30 DIAGNOSIS — I5032 Chronic diastolic (congestive) heart failure: Secondary | ICD-10-CM | POA: Diagnosis not present

## 2016-05-30 DIAGNOSIS — I130 Hypertensive heart and chronic kidney disease with heart failure and stage 1 through stage 4 chronic kidney disease, or unspecified chronic kidney disease: Secondary | ICD-10-CM | POA: Diagnosis not present

## 2016-05-30 DIAGNOSIS — Z466 Encounter for fitting and adjustment of urinary device: Secondary | ICD-10-CM | POA: Diagnosis not present

## 2016-05-30 DIAGNOSIS — J9610 Chronic respiratory failure, unspecified whether with hypoxia or hypercapnia: Secondary | ICD-10-CM | POA: Diagnosis not present

## 2016-05-30 DIAGNOSIS — I4891 Unspecified atrial fibrillation: Secondary | ICD-10-CM | POA: Diagnosis not present

## 2016-05-30 DIAGNOSIS — C7951 Secondary malignant neoplasm of bone: Secondary | ICD-10-CM | POA: Diagnosis not present

## 2016-05-30 DIAGNOSIS — C3490 Malignant neoplasm of unspecified part of unspecified bronchus or lung: Secondary | ICD-10-CM | POA: Diagnosis not present

## 2016-05-30 DIAGNOSIS — I2510 Atherosclerotic heart disease of native coronary artery without angina pectoris: Secondary | ICD-10-CM | POA: Diagnosis not present

## 2016-05-30 DIAGNOSIS — L89154 Pressure ulcer of sacral region, stage 4: Secondary | ICD-10-CM | POA: Diagnosis not present

## 2016-05-30 DIAGNOSIS — D631 Anemia in chronic kidney disease: Secondary | ICD-10-CM | POA: Diagnosis not present

## 2016-05-31 DIAGNOSIS — Z466 Encounter for fitting and adjustment of urinary device: Secondary | ICD-10-CM | POA: Diagnosis not present

## 2016-05-31 DIAGNOSIS — L89154 Pressure ulcer of sacral region, stage 4: Secondary | ICD-10-CM | POA: Diagnosis not present

## 2016-05-31 DIAGNOSIS — C3490 Malignant neoplasm of unspecified part of unspecified bronchus or lung: Secondary | ICD-10-CM | POA: Diagnosis not present

## 2016-05-31 DIAGNOSIS — I130 Hypertensive heart and chronic kidney disease with heart failure and stage 1 through stage 4 chronic kidney disease, or unspecified chronic kidney disease: Secondary | ICD-10-CM | POA: Diagnosis not present

## 2016-05-31 DIAGNOSIS — G8311 Monoplegia of lower limb affecting right dominant side: Secondary | ICD-10-CM | POA: Diagnosis not present

## 2016-05-31 DIAGNOSIS — C7951 Secondary malignant neoplasm of bone: Secondary | ICD-10-CM | POA: Diagnosis not present

## 2016-05-31 DIAGNOSIS — I4891 Unspecified atrial fibrillation: Secondary | ICD-10-CM | POA: Diagnosis not present

## 2016-05-31 DIAGNOSIS — N183 Chronic kidney disease, stage 3 (moderate): Secondary | ICD-10-CM | POA: Diagnosis not present

## 2016-05-31 DIAGNOSIS — J9610 Chronic respiratory failure, unspecified whether with hypoxia or hypercapnia: Secondary | ICD-10-CM | POA: Diagnosis not present

## 2016-05-31 DIAGNOSIS — D631 Anemia in chronic kidney disease: Secondary | ICD-10-CM | POA: Diagnosis not present

## 2016-05-31 DIAGNOSIS — I5032 Chronic diastolic (congestive) heart failure: Secondary | ICD-10-CM | POA: Diagnosis not present

## 2016-05-31 DIAGNOSIS — I2510 Atherosclerotic heart disease of native coronary artery without angina pectoris: Secondary | ICD-10-CM | POA: Diagnosis not present

## 2016-06-04 DIAGNOSIS — I2510 Atherosclerotic heart disease of native coronary artery without angina pectoris: Secondary | ICD-10-CM | POA: Diagnosis not present

## 2016-06-04 DIAGNOSIS — C7951 Secondary malignant neoplasm of bone: Secondary | ICD-10-CM | POA: Diagnosis not present

## 2016-06-04 DIAGNOSIS — G8311 Monoplegia of lower limb affecting right dominant side: Secondary | ICD-10-CM | POA: Diagnosis not present

## 2016-06-04 DIAGNOSIS — C3490 Malignant neoplasm of unspecified part of unspecified bronchus or lung: Secondary | ICD-10-CM | POA: Diagnosis not present

## 2016-06-04 DIAGNOSIS — J9610 Chronic respiratory failure, unspecified whether with hypoxia or hypercapnia: Secondary | ICD-10-CM | POA: Diagnosis not present

## 2016-06-04 DIAGNOSIS — I130 Hypertensive heart and chronic kidney disease with heart failure and stage 1 through stage 4 chronic kidney disease, or unspecified chronic kidney disease: Secondary | ICD-10-CM | POA: Diagnosis not present

## 2016-06-04 DIAGNOSIS — I4891 Unspecified atrial fibrillation: Secondary | ICD-10-CM | POA: Diagnosis not present

## 2016-06-04 DIAGNOSIS — Z466 Encounter for fitting and adjustment of urinary device: Secondary | ICD-10-CM | POA: Diagnosis not present

## 2016-06-04 DIAGNOSIS — N183 Chronic kidney disease, stage 3 (moderate): Secondary | ICD-10-CM | POA: Diagnosis not present

## 2016-06-04 DIAGNOSIS — L89154 Pressure ulcer of sacral region, stage 4: Secondary | ICD-10-CM | POA: Diagnosis not present

## 2016-06-04 DIAGNOSIS — I5032 Chronic diastolic (congestive) heart failure: Secondary | ICD-10-CM | POA: Diagnosis not present

## 2016-06-04 DIAGNOSIS — D631 Anemia in chronic kidney disease: Secondary | ICD-10-CM | POA: Diagnosis not present

## 2016-06-06 DIAGNOSIS — C7951 Secondary malignant neoplasm of bone: Secondary | ICD-10-CM | POA: Diagnosis not present

## 2016-06-06 DIAGNOSIS — I2510 Atherosclerotic heart disease of native coronary artery without angina pectoris: Secondary | ICD-10-CM | POA: Diagnosis not present

## 2016-06-06 DIAGNOSIS — I130 Hypertensive heart and chronic kidney disease with heart failure and stage 1 through stage 4 chronic kidney disease, or unspecified chronic kidney disease: Secondary | ICD-10-CM | POA: Diagnosis not present

## 2016-06-06 DIAGNOSIS — G8311 Monoplegia of lower limb affecting right dominant side: Secondary | ICD-10-CM | POA: Diagnosis not present

## 2016-06-06 DIAGNOSIS — I5032 Chronic diastolic (congestive) heart failure: Secondary | ICD-10-CM | POA: Diagnosis not present

## 2016-06-06 DIAGNOSIS — C3490 Malignant neoplasm of unspecified part of unspecified bronchus or lung: Secondary | ICD-10-CM | POA: Diagnosis not present

## 2016-06-06 DIAGNOSIS — I4891 Unspecified atrial fibrillation: Secondary | ICD-10-CM | POA: Diagnosis not present

## 2016-06-06 DIAGNOSIS — Z466 Encounter for fitting and adjustment of urinary device: Secondary | ICD-10-CM | POA: Diagnosis not present

## 2016-06-06 DIAGNOSIS — J9610 Chronic respiratory failure, unspecified whether with hypoxia or hypercapnia: Secondary | ICD-10-CM | POA: Diagnosis not present

## 2016-06-06 DIAGNOSIS — N183 Chronic kidney disease, stage 3 (moderate): Secondary | ICD-10-CM | POA: Diagnosis not present

## 2016-06-06 DIAGNOSIS — L89154 Pressure ulcer of sacral region, stage 4: Secondary | ICD-10-CM | POA: Diagnosis not present

## 2016-06-06 DIAGNOSIS — D631 Anemia in chronic kidney disease: Secondary | ICD-10-CM | POA: Diagnosis not present

## 2016-06-07 ENCOUNTER — Other Ambulatory Visit: Payer: Self-pay | Admitting: Nurse Practitioner

## 2016-06-07 DIAGNOSIS — M7061 Trochanteric bursitis, right hip: Secondary | ICD-10-CM | POA: Diagnosis not present

## 2016-06-07 DIAGNOSIS — N183 Chronic kidney disease, stage 3 (moderate): Secondary | ICD-10-CM | POA: Diagnosis not present

## 2016-06-07 DIAGNOSIS — E46 Unspecified protein-calorie malnutrition: Secondary | ICD-10-CM | POA: Diagnosis not present

## 2016-06-07 DIAGNOSIS — I509 Heart failure, unspecified: Secondary | ICD-10-CM | POA: Diagnosis not present

## 2016-06-07 DIAGNOSIS — L89153 Pressure ulcer of sacral region, stage 3: Secondary | ICD-10-CM | POA: Diagnosis not present

## 2016-06-07 DIAGNOSIS — R000 Tachycardia, unspecified: Secondary | ICD-10-CM | POA: Diagnosis not present

## 2016-06-07 DIAGNOSIS — I8291 Chronic embolism and thrombosis of unspecified vein: Secondary | ICD-10-CM | POA: Diagnosis not present

## 2016-06-07 DIAGNOSIS — D0220 Carcinoma in situ of unspecified bronchus and lung: Secondary | ICD-10-CM | POA: Diagnosis not present

## 2016-06-11 DIAGNOSIS — I2510 Atherosclerotic heart disease of native coronary artery without angina pectoris: Secondary | ICD-10-CM | POA: Diagnosis not present

## 2016-06-11 DIAGNOSIS — I130 Hypertensive heart and chronic kidney disease with heart failure and stage 1 through stage 4 chronic kidney disease, or unspecified chronic kidney disease: Secondary | ICD-10-CM | POA: Diagnosis not present

## 2016-06-11 DIAGNOSIS — C3490 Malignant neoplasm of unspecified part of unspecified bronchus or lung: Secondary | ICD-10-CM | POA: Diagnosis not present

## 2016-06-11 DIAGNOSIS — I5032 Chronic diastolic (congestive) heart failure: Secondary | ICD-10-CM | POA: Diagnosis not present

## 2016-06-11 DIAGNOSIS — Z86718 Personal history of other venous thrombosis and embolism: Secondary | ICD-10-CM | POA: Diagnosis not present

## 2016-06-11 DIAGNOSIS — Z466 Encounter for fitting and adjustment of urinary device: Secondary | ICD-10-CM | POA: Diagnosis not present

## 2016-06-11 DIAGNOSIS — G8311 Monoplegia of lower limb affecting right dominant side: Secondary | ICD-10-CM | POA: Diagnosis not present

## 2016-06-11 DIAGNOSIS — C7951 Secondary malignant neoplasm of bone: Secondary | ICD-10-CM | POA: Diagnosis not present

## 2016-06-11 DIAGNOSIS — Z87440 Personal history of urinary (tract) infections: Secondary | ICD-10-CM | POA: Diagnosis not present

## 2016-06-11 DIAGNOSIS — N183 Chronic kidney disease, stage 3 (moderate): Secondary | ICD-10-CM | POA: Diagnosis not present

## 2016-06-13 DIAGNOSIS — C3490 Malignant neoplasm of unspecified part of unspecified bronchus or lung: Secondary | ICD-10-CM | POA: Diagnosis not present

## 2016-06-13 DIAGNOSIS — C7951 Secondary malignant neoplasm of bone: Secondary | ICD-10-CM | POA: Diagnosis not present

## 2016-06-13 DIAGNOSIS — Z86718 Personal history of other venous thrombosis and embolism: Secondary | ICD-10-CM | POA: Diagnosis not present

## 2016-06-13 DIAGNOSIS — Z466 Encounter for fitting and adjustment of urinary device: Secondary | ICD-10-CM | POA: Diagnosis not present

## 2016-06-13 DIAGNOSIS — N183 Chronic kidney disease, stage 3 (moderate): Secondary | ICD-10-CM | POA: Diagnosis not present

## 2016-06-13 DIAGNOSIS — Z87440 Personal history of urinary (tract) infections: Secondary | ICD-10-CM | POA: Diagnosis not present

## 2016-06-13 DIAGNOSIS — I2510 Atherosclerotic heart disease of native coronary artery without angina pectoris: Secondary | ICD-10-CM | POA: Diagnosis not present

## 2016-06-13 DIAGNOSIS — G8311 Monoplegia of lower limb affecting right dominant side: Secondary | ICD-10-CM | POA: Diagnosis not present

## 2016-06-13 DIAGNOSIS — I130 Hypertensive heart and chronic kidney disease with heart failure and stage 1 through stage 4 chronic kidney disease, or unspecified chronic kidney disease: Secondary | ICD-10-CM | POA: Diagnosis not present

## 2016-06-13 DIAGNOSIS — I5032 Chronic diastolic (congestive) heart failure: Secondary | ICD-10-CM | POA: Diagnosis not present

## 2016-06-14 DIAGNOSIS — I5032 Chronic diastolic (congestive) heart failure: Secondary | ICD-10-CM | POA: Diagnosis not present

## 2016-06-14 DIAGNOSIS — Z86718 Personal history of other venous thrombosis and embolism: Secondary | ICD-10-CM | POA: Diagnosis not present

## 2016-06-14 DIAGNOSIS — Z466 Encounter for fitting and adjustment of urinary device: Secondary | ICD-10-CM | POA: Diagnosis not present

## 2016-06-14 DIAGNOSIS — C3490 Malignant neoplasm of unspecified part of unspecified bronchus or lung: Secondary | ICD-10-CM | POA: Diagnosis not present

## 2016-06-14 DIAGNOSIS — I130 Hypertensive heart and chronic kidney disease with heart failure and stage 1 through stage 4 chronic kidney disease, or unspecified chronic kidney disease: Secondary | ICD-10-CM | POA: Diagnosis not present

## 2016-06-14 DIAGNOSIS — Z87440 Personal history of urinary (tract) infections: Secondary | ICD-10-CM | POA: Diagnosis not present

## 2016-06-14 DIAGNOSIS — N183 Chronic kidney disease, stage 3 (moderate): Secondary | ICD-10-CM | POA: Diagnosis not present

## 2016-06-14 DIAGNOSIS — G8311 Monoplegia of lower limb affecting right dominant side: Secondary | ICD-10-CM | POA: Diagnosis not present

## 2016-06-14 DIAGNOSIS — C7951 Secondary malignant neoplasm of bone: Secondary | ICD-10-CM | POA: Diagnosis not present

## 2016-06-14 DIAGNOSIS — I2510 Atherosclerotic heart disease of native coronary artery without angina pectoris: Secondary | ICD-10-CM | POA: Diagnosis not present

## 2016-06-20 ENCOUNTER — Ambulatory Visit (HOSPITAL_COMMUNITY): Payer: Medicare Other

## 2016-06-21 ENCOUNTER — Other Ambulatory Visit (HOSPITAL_BASED_OUTPATIENT_CLINIC_OR_DEPARTMENT_OTHER): Payer: Medicare Other

## 2016-06-21 ENCOUNTER — Ambulatory Visit (HOSPITAL_BASED_OUTPATIENT_CLINIC_OR_DEPARTMENT_OTHER): Payer: Medicare Other

## 2016-06-21 ENCOUNTER — Ambulatory Visit (HOSPITAL_COMMUNITY)
Admission: RE | Admit: 2016-06-21 | Discharge: 2016-06-21 | Disposition: A | Discharge status: Home / Self Care | Payer: Medicare Other | Source: Ambulatory Visit | Attending: Internal Medicine | Admitting: Internal Medicine

## 2016-06-21 DIAGNOSIS — R590 Localized enlarged lymph nodes: Secondary | ICD-10-CM | POA: Diagnosis not present

## 2016-06-21 DIAGNOSIS — R222 Localized swelling, mass and lump, trunk: Secondary | ICD-10-CM | POA: Insufficient documentation

## 2016-06-21 DIAGNOSIS — R918 Other nonspecific abnormal finding of lung field: Secondary | ICD-10-CM | POA: Diagnosis not present

## 2016-06-21 DIAGNOSIS — C3412 Malignant neoplasm of upper lobe, left bronchus or lung: Secondary | ICD-10-CM | POA: Diagnosis not present

## 2016-06-21 DIAGNOSIS — C7951 Secondary malignant neoplasm of bone: Secondary | ICD-10-CM

## 2016-06-21 DIAGNOSIS — N200 Calculus of kidney: Secondary | ICD-10-CM | POA: Insufficient documentation

## 2016-06-21 DIAGNOSIS — I712 Thoracic aortic aneurysm, without rupture: Secondary | ICD-10-CM | POA: Insufficient documentation

## 2016-06-21 DIAGNOSIS — I2510 Atherosclerotic heart disease of native coronary artery without angina pectoris: Secondary | ICD-10-CM | POA: Diagnosis not present

## 2016-06-21 DIAGNOSIS — I700 Atherosclerosis of aorta: Secondary | ICD-10-CM | POA: Diagnosis not present

## 2016-06-21 DIAGNOSIS — K5730 Diverticulosis of large intestine without perforation or abscess without bleeding: Secondary | ICD-10-CM | POA: Insufficient documentation

## 2016-06-21 DIAGNOSIS — Z95828 Presence of other vascular implants and grafts: Secondary | ICD-10-CM

## 2016-06-21 DIAGNOSIS — J439 Emphysema, unspecified: Secondary | ICD-10-CM | POA: Diagnosis not present

## 2016-06-21 LAB — CBC WITH DIFFERENTIAL/PLATELET
BASO%: 0.2 % (ref 0.0–2.0)
BASOS ABS: 0 10*3/uL (ref 0.0–0.1)
EOS%: 3.9 % (ref 0.0–7.0)
Eosinophils Absolute: 0.2 10*3/uL (ref 0.0–0.5)
HCT: 34.8 % (ref 34.8–46.6)
HEMOGLOBIN: 11.2 g/dL — AB (ref 11.6–15.9)
LYMPH#: 0.8 10*3/uL — AB (ref 0.9–3.3)
LYMPH%: 16.4 % (ref 14.0–49.7)
MCH: 28.4 pg (ref 25.1–34.0)
MCHC: 32.2 g/dL (ref 31.5–36.0)
MCV: 88.1 fL (ref 79.5–101.0)
MONO#: 0.8 10*3/uL (ref 0.1–0.9)
MONO%: 15 % — ABNORMAL HIGH (ref 0.0–14.0)
NEUT#: 3.3 10*3/uL (ref 1.5–6.5)
NEUT%: 64.5 % (ref 38.4–76.8)
PLATELETS: 194 10*3/uL (ref 145–400)
RBC: 3.95 10*6/uL (ref 3.70–5.45)
RDW: 13.8 % (ref 11.2–14.5)
WBC: 5.1 10*3/uL (ref 3.9–10.3)

## 2016-06-21 LAB — COMPREHENSIVE METABOLIC PANEL
ALBUMIN: 3 g/dL — AB (ref 3.5–5.0)
ALK PHOS: 133 U/L (ref 40–150)
ALT: 11 U/L (ref 0–55)
ANION GAP: 9 meq/L (ref 3–11)
AST: 20 U/L (ref 5–34)
BUN: 48.1 mg/dL — AB (ref 7.0–26.0)
CHLORIDE: 109 meq/L (ref 98–109)
CO2: 19 mEq/L — ABNORMAL LOW (ref 22–29)
CREATININE: 3.1 mg/dL — AB (ref 0.6–1.1)
Calcium: 8.8 mg/dL (ref 8.4–10.4)
EGFR: 13 mL/min/{1.73_m2} — ABNORMAL LOW (ref >90)
Glucose: 88 mg/dl (ref 70–140)
POTASSIUM: 4.8 meq/L (ref 3.5–5.1)
Sodium: 137 mEq/L (ref 136–145)
Total Bilirubin: 0.44 mg/dL (ref 0.20–1.20)
Total Protein: 6.9 g/dL (ref 6.4–8.3)

## 2016-06-21 MED ORDER — SODIUM CHLORIDE 0.9% FLUSH
10.0000 mL | INTRAVENOUS | Status: DC | PRN
  Administered 2016-06-21: 10 mL via INTRAVENOUS
  Filled 2016-06-21: qty 10

## 2016-06-21 MED ORDER — HEPARIN SOD (PORK) LOCK FLUSH 100 UNIT/ML IV SOLN
500.0000 [IU] | Freq: Once | INTRAVENOUS | Status: AC | PRN
  Administered 2016-06-21: 500 [IU] via INTRAVENOUS
  Filled 2016-06-21: qty 5

## 2016-06-24 DIAGNOSIS — N183 Chronic kidney disease, stage 3 (moderate): Secondary | ICD-10-CM | POA: Diagnosis not present

## 2016-06-24 DIAGNOSIS — G8311 Monoplegia of lower limb affecting right dominant side: Secondary | ICD-10-CM | POA: Diagnosis not present

## 2016-06-24 DIAGNOSIS — I130 Hypertensive heart and chronic kidney disease with heart failure and stage 1 through stage 4 chronic kidney disease, or unspecified chronic kidney disease: Secondary | ICD-10-CM | POA: Diagnosis not present

## 2016-06-24 DIAGNOSIS — Z466 Encounter for fitting and adjustment of urinary device: Secondary | ICD-10-CM | POA: Diagnosis not present

## 2016-06-24 DIAGNOSIS — Z86718 Personal history of other venous thrombosis and embolism: Secondary | ICD-10-CM | POA: Diagnosis not present

## 2016-06-24 DIAGNOSIS — I5032 Chronic diastolic (congestive) heart failure: Secondary | ICD-10-CM | POA: Diagnosis not present

## 2016-06-24 DIAGNOSIS — I2510 Atherosclerotic heart disease of native coronary artery without angina pectoris: Secondary | ICD-10-CM | POA: Diagnosis not present

## 2016-06-24 DIAGNOSIS — Z87440 Personal history of urinary (tract) infections: Secondary | ICD-10-CM | POA: Diagnosis not present

## 2016-06-24 DIAGNOSIS — C3490 Malignant neoplasm of unspecified part of unspecified bronchus or lung: Secondary | ICD-10-CM | POA: Diagnosis not present

## 2016-06-24 DIAGNOSIS — C7951 Secondary malignant neoplasm of bone: Secondary | ICD-10-CM | POA: Diagnosis not present

## 2016-06-25 DIAGNOSIS — I2510 Atherosclerotic heart disease of native coronary artery without angina pectoris: Secondary | ICD-10-CM | POA: Diagnosis not present

## 2016-06-25 DIAGNOSIS — G8311 Monoplegia of lower limb affecting right dominant side: Secondary | ICD-10-CM | POA: Diagnosis not present

## 2016-06-25 DIAGNOSIS — N183 Chronic kidney disease, stage 3 (moderate): Secondary | ICD-10-CM | POA: Diagnosis not present

## 2016-06-25 DIAGNOSIS — C3490 Malignant neoplasm of unspecified part of unspecified bronchus or lung: Secondary | ICD-10-CM | POA: Diagnosis not present

## 2016-06-25 DIAGNOSIS — Z86718 Personal history of other venous thrombosis and embolism: Secondary | ICD-10-CM | POA: Diagnosis not present

## 2016-06-25 DIAGNOSIS — I5032 Chronic diastolic (congestive) heart failure: Secondary | ICD-10-CM | POA: Diagnosis not present

## 2016-06-25 DIAGNOSIS — Z466 Encounter for fitting and adjustment of urinary device: Secondary | ICD-10-CM | POA: Diagnosis not present

## 2016-06-25 DIAGNOSIS — I130 Hypertensive heart and chronic kidney disease with heart failure and stage 1 through stage 4 chronic kidney disease, or unspecified chronic kidney disease: Secondary | ICD-10-CM | POA: Diagnosis not present

## 2016-06-25 DIAGNOSIS — Z87440 Personal history of urinary (tract) infections: Secondary | ICD-10-CM | POA: Diagnosis not present

## 2016-06-25 DIAGNOSIS — C7951 Secondary malignant neoplasm of bone: Secondary | ICD-10-CM | POA: Diagnosis not present

## 2016-06-26 ENCOUNTER — Other Ambulatory Visit: Payer: Medicare Other

## 2016-06-28 DIAGNOSIS — G8311 Monoplegia of lower limb affecting right dominant side: Secondary | ICD-10-CM | POA: Diagnosis not present

## 2016-06-28 DIAGNOSIS — C7951 Secondary malignant neoplasm of bone: Secondary | ICD-10-CM | POA: Diagnosis not present

## 2016-06-28 DIAGNOSIS — Z466 Encounter for fitting and adjustment of urinary device: Secondary | ICD-10-CM | POA: Diagnosis not present

## 2016-06-28 DIAGNOSIS — Z86718 Personal history of other venous thrombosis and embolism: Secondary | ICD-10-CM | POA: Diagnosis not present

## 2016-06-28 DIAGNOSIS — Z87440 Personal history of urinary (tract) infections: Secondary | ICD-10-CM | POA: Diagnosis not present

## 2016-06-28 DIAGNOSIS — N183 Chronic kidney disease, stage 3 (moderate): Secondary | ICD-10-CM | POA: Diagnosis not present

## 2016-06-28 DIAGNOSIS — I130 Hypertensive heart and chronic kidney disease with heart failure and stage 1 through stage 4 chronic kidney disease, or unspecified chronic kidney disease: Secondary | ICD-10-CM | POA: Diagnosis not present

## 2016-06-28 DIAGNOSIS — I5032 Chronic diastolic (congestive) heart failure: Secondary | ICD-10-CM | POA: Diagnosis not present

## 2016-06-28 DIAGNOSIS — C3490 Malignant neoplasm of unspecified part of unspecified bronchus or lung: Secondary | ICD-10-CM | POA: Diagnosis not present

## 2016-06-28 DIAGNOSIS — I2510 Atherosclerotic heart disease of native coronary artery without angina pectoris: Secondary | ICD-10-CM | POA: Diagnosis not present

## 2016-07-01 DIAGNOSIS — C3490 Malignant neoplasm of unspecified part of unspecified bronchus or lung: Secondary | ICD-10-CM | POA: Diagnosis not present

## 2016-07-01 DIAGNOSIS — Z87440 Personal history of urinary (tract) infections: Secondary | ICD-10-CM | POA: Diagnosis not present

## 2016-07-01 DIAGNOSIS — C7951 Secondary malignant neoplasm of bone: Secondary | ICD-10-CM | POA: Diagnosis not present

## 2016-07-01 DIAGNOSIS — Z86718 Personal history of other venous thrombosis and embolism: Secondary | ICD-10-CM | POA: Diagnosis not present

## 2016-07-01 DIAGNOSIS — G8311 Monoplegia of lower limb affecting right dominant side: Secondary | ICD-10-CM | POA: Diagnosis not present

## 2016-07-01 DIAGNOSIS — N183 Chronic kidney disease, stage 3 (moderate): Secondary | ICD-10-CM | POA: Diagnosis not present

## 2016-07-01 DIAGNOSIS — Z466 Encounter for fitting and adjustment of urinary device: Secondary | ICD-10-CM | POA: Diagnosis not present

## 2016-07-01 DIAGNOSIS — I5032 Chronic diastolic (congestive) heart failure: Secondary | ICD-10-CM | POA: Diagnosis not present

## 2016-07-01 DIAGNOSIS — I130 Hypertensive heart and chronic kidney disease with heart failure and stage 1 through stage 4 chronic kidney disease, or unspecified chronic kidney disease: Secondary | ICD-10-CM | POA: Diagnosis not present

## 2016-07-01 DIAGNOSIS — I2510 Atherosclerotic heart disease of native coronary artery without angina pectoris: Secondary | ICD-10-CM | POA: Diagnosis not present

## 2016-07-02 DIAGNOSIS — I130 Hypertensive heart and chronic kidney disease with heart failure and stage 1 through stage 4 chronic kidney disease, or unspecified chronic kidney disease: Secondary | ICD-10-CM | POA: Diagnosis not present

## 2016-07-02 DIAGNOSIS — N183 Chronic kidney disease, stage 3 (moderate): Secondary | ICD-10-CM | POA: Diagnosis not present

## 2016-07-02 DIAGNOSIS — I2510 Atherosclerotic heart disease of native coronary artery without angina pectoris: Secondary | ICD-10-CM | POA: Diagnosis not present

## 2016-07-02 DIAGNOSIS — C7951 Secondary malignant neoplasm of bone: Secondary | ICD-10-CM | POA: Diagnosis not present

## 2016-07-02 DIAGNOSIS — Z86718 Personal history of other venous thrombosis and embolism: Secondary | ICD-10-CM | POA: Diagnosis not present

## 2016-07-02 DIAGNOSIS — C3490 Malignant neoplasm of unspecified part of unspecified bronchus or lung: Secondary | ICD-10-CM | POA: Diagnosis not present

## 2016-07-02 DIAGNOSIS — Z466 Encounter for fitting and adjustment of urinary device: Secondary | ICD-10-CM | POA: Diagnosis not present

## 2016-07-02 DIAGNOSIS — I5032 Chronic diastolic (congestive) heart failure: Secondary | ICD-10-CM | POA: Diagnosis not present

## 2016-07-02 DIAGNOSIS — G8311 Monoplegia of lower limb affecting right dominant side: Secondary | ICD-10-CM | POA: Diagnosis not present

## 2016-07-02 DIAGNOSIS — Z87440 Personal history of urinary (tract) infections: Secondary | ICD-10-CM | POA: Diagnosis not present

## 2016-07-03 ENCOUNTER — Telehealth: Payer: Self-pay | Admitting: Internal Medicine

## 2016-07-03 ENCOUNTER — Encounter: Payer: Self-pay | Admitting: Internal Medicine

## 2016-07-03 ENCOUNTER — Ambulatory Visit (HOSPITAL_BASED_OUTPATIENT_CLINIC_OR_DEPARTMENT_OTHER): Payer: Medicare Other | Admitting: Internal Medicine

## 2016-07-03 VITALS — BP 144/72 | HR 58 | Temp 97.8°F | Resp 18 | Ht 68.0 in

## 2016-07-03 DIAGNOSIS — R531 Weakness: Secondary | ICD-10-CM

## 2016-07-03 DIAGNOSIS — N183 Chronic kidney disease, stage 3 (moderate): Secondary | ICD-10-CM

## 2016-07-03 DIAGNOSIS — C7951 Secondary malignant neoplasm of bone: Secondary | ICD-10-CM

## 2016-07-03 DIAGNOSIS — N179 Acute kidney failure, unspecified: Secondary | ICD-10-CM

## 2016-07-03 DIAGNOSIS — N189 Chronic kidney disease, unspecified: Secondary | ICD-10-CM

## 2016-07-03 DIAGNOSIS — C3490 Malignant neoplasm of unspecified part of unspecified bronchus or lung: Secondary | ICD-10-CM | POA: Diagnosis not present

## 2016-07-03 NOTE — Telephone Encounter (Signed)
2 bottles of contrast given to patient per 07/03/16 los. Appointments scheduled per 07/03/16 los. Patient was given a copy of the AVS report and appointment schedule, per 07/03/16 los.

## 2016-07-03 NOTE — Progress Notes (Signed)
[place] [phone]   [phone]  OFFICE PROGRESS NOTE  [name] Neighbors, MD [name] [id number]  PRINCIPAL DIAGNOSIS: Stage IV non-small cell lung cancer diagnosed in March 2007, with disease recurrence in June 2016 with positive PDL 1 expression (50%).   PRIOR THERAPY:  1) Status post 6 cycles of systemic chemotherapy with carboplatin and docetaxel. Last dose was given January 16, 2006.  2) Palliative radiotherapy to the large right paraspinous mass with osseous invasion centered at L5 under the care of Dr. Valere Dross. 3) Systemic chemotherapy with carboplatin for AUC of 5 and paclitaxel 175 MG/M2 every 3 weeks with Neulasta support. First dose expected on 02/02/2015. Status post 2 cycles, last dose was given 02/23/2015 discontinued secondary to intolerance and mild disease progression. 4) Ketruda 200 mg IV every 3 weeks. First dose 04/14/2015. Status post 3 cycles. Treatment was discontinued after the patient had several hospitalization with recurrent infection and back abscess but were unrelated to her treatment.  CURRENT THERAPY: Observation.  DISEASE STAGE: Stage IV non-small cell lung cancer diagnosed in March of 2007.  CHEMOTHERAPY INTENT: Palliative  CURRENT # OF CHEMOTHERAPY CYCLES: 0  CURRENT ANTIEMETICS: None  CURRENT SMOKING STATUS: Nonsmoker  ORAL CHEMOTHERAPY AND CONSENT: None  CURRENT BISPHOSPHONATES USE: None  LIVING WILL AND CODE STATUS: Full code  INTERVAL HISTORY: [name] 83 y.o. female came to the clinic today for follow-up visit. The patient is feeling fine today with no specific complaints except for the generalized fatigue. She continues with physical therapy. She denied having any chest pain, shortness of breath, cough or hemoptysis. She has no nausea, vomiting, diarrhea or constipation. She has no fever or chills. She has no significant weight loss or night sweats. She had repeat CT scan of the  chest, abdomen and pelvis performed recently and she is here for evaluation and discussion of her scan results.   MEDICAL HISTORY: Past Medical History:  Diagnosis Date  . A-fib ([place])   . Chronic anemia   . Chronic diastolic (congestive) heart failure   . Chronic fatigue 04/04/2015  . Chronic fatigue 04/04/2015  . CKD (chronic kidney disease)   . Coronary artery disease    a.  LHC (06/04/05): LHC done in [place] with high grade RCA => s/p BMS to RCA;  b.  Nuclear (09/14/09): Lexiscan; Inf infarct with mild peri-infarct ishemia, EF 52%; Low Risk.  [name] DVT (deep venous thrombosis) ([place])   . Foot drop, right   . GERD (gastroesophageal reflux disease)   . Hypercholesterolemia   . Hypertension   . Ischemic cardiomyopathy    a. Echo (07/26/13): Mild LVH, EF 35-40%, diff HK, inf AK, Gr 2 DD, Tr AI, mildly dilated Ao root, MAC, mild MR, mild LAE, mod reduced RVSF.  . Non-small cell carcinoma of lung ([place])    Stage IV;spinal cancer; had chemo and radiation and immune therapy    ALLERGIES:  is allergic to amlodipine; ciprofloxacin; mirtazapine; statins; aspirin; cephalexin; hydralazine; iron; ambien [zolpidem tartrate]; lorazepam; sulfonamide derivatives; zolpidem; penicillins; shellfish allergy; and tape.  MEDICATIONS:  Current Outpatient Prescriptions  Medication Sig Dispense Refill  . acetaminophen (TYLENOL) 500 MG tablet Take 500 mg by mouth daily as needed for mild pain.    . furosemide (LASIX) 40 MG tablet Take 40 mg by mouth daily as needed.    . lidocaine-prilocaine (EMLA) cream Apply 1 application topically as needed (for pain).    . metoprolol tartrate (LOPRESSOR) 25  MG tablet Take 0.5 tablets (12.5 mg total) by mouth at bedtime. 30 tablet 0  . pantoprazole (PROTONIX) 40 MG tablet Take 40 mg by mouth at bedtime.    . diphenoxylate-atropine (LOMOTIL) 2.5-0.025 MG tablet Take by mouth 4 (four) times daily as needed for diarrhea or loose stools. Reported on 11/28/2015    .  loperamide (IMODIUM) 2 MG capsule Take 1 capsule (2 mg total) by mouth every 6 (six) hours as needed for diarrhea or loose stools. (Patient not taking: Reported on 03/27/2016) 30 capsule 0  . SANTYL ointment Apply 1 application topically at bedtime as needed (bed sores). For bed sores     No current facility-administered medications for this visit.     SURGICAL HISTORY:  Past Surgical History:  Procedure Laterality Date  . ABDOMINAL HYSTERECTOMY    . APPENDECTOMY    . CATARACT EXTRACTION Bilateral   . CHOLECYSTECTOMY  2011  . CYSTOSCOPY     with stent placement  . CYSTOSCOPY W/ URETERAL STENT PLACEMENT Right 12/12/2015   Procedure: CYSTOSCOPY WITH RETROGRADE PYELOGRAM WITH STENT EXCHANGE;  Surgeon: Franchot Gallo, MD;  Location: [facility];  Service: Urology;  Laterality: Right;  . ERCP  2011   with stone extraction, done same day as cholecystectomy  . HEMATOMA EVACUATION  December 2006   groin  . HEMORROIDECTOMY    . LAMINECTOMY    . PORTACATH PLACEMENT    . TONSILLECTOMY      REVIEW OF SYSTEMS:  A comprehensive review of systems was negative except for: Constitutional: positive for fatigue Musculoskeletal: positive for muscle weakness   PHYSICAL EXAMINATION: General appearance: alert, cooperative and no distress Head: Normocephalic, without obvious abnormality, atraumatic Neck: no adenopathy Lymph nodes: Cervical, supraclavicular, and axillary nodes normal. Resp: clear to auscultation bilaterally Back: symmetric, no curvature. ROM normal. No CVA tenderness. Cardio: regular rate and rhythm, S1, S2 normal, no murmur, click, rub or gallop GI: soft, non-tender; bowel sounds normal; no masses,  no organomegaly Extremities: extremities normal, atraumatic, no cyanosis or edema  ECOG PERFORMANCE STATUS: 2 - Symptomatic, <50% confined to bed  Blood pressure (!) 144/72, pulse (!) 58, temperature 97.8 F (36.6 C), temperature source Oral, resp. rate 18, height _0  (1.727 m), SpO2  100 %.  LABORATORY DATA: Lab Results  Component Value Date   WBC 5.1 06/21/2016   HGB 11.2 (L) 06/21/2016   HCT 34.8 06/21/2016   MCV 88.1 06/21/2016   PLT 194 06/21/2016      Chemistry      Component Value Date/Time   NA 137 06/21/2016 0853   K 4.8 06/21/2016 0853   CL 100 (L) 12/07/2015 0853   CL 104 06/19/2012 0958   CO2 19 (L) 06/21/2016 0853   BUN 48.1 (H) 06/21/2016 0853   CREATININE 3.1 (HH) 06/21/2016 0853      Component Value Date/Time   CALCIUM 8.8 06/21/2016 0853   ALKPHOS 133 06/21/2016 0853   AST 20 06/21/2016 0853   ALT 11 06/21/2016 0853   BILITOT 0.44 06/21/2016 0853       RADIOGRAPHIC STUDIES: Ct Abdomen Pelvis Wo Contrast  Result Date: 06/21/2016 CLINICAL DATA:  Stage IV squamous cell lung carcinoma of the left upper lobe diagnosed March 2007 status post chemotherapy and immunotherapy discontinued December 2016. History of palliative radiotherapy to L5 paraspinous mass July 2016. Patient presents for restaging. EXAM: CT CHEST, ABDOMEN AND PELVIS WITHOUT CONTRAST TECHNIQUE: Multidetector CT imaging of the chest, abdomen and pelvis was performed following the standard protocol without  IV contrast. COMPARISON:  03/21/2016 CT chest, abdomen and pelvis. FINDINGS: CT CHEST FINDINGS Cardiovascular: Normal heart size. No significant pericardial fluid/thickening. Left main, left anterior descending, left circumflex and right coronary atherosclerosis. Right internal jugular MediPort terminates in the lower third of the superior vena cava. Thoracic aortic atherosclerosis with 4.2 cm ascending thoracic aortic aneurysm, stable. Normal caliber pulmonary arteries. Mediastinum/Nodes: Stable coarse subcentimeter calcifications in the bilateral thyroid lobes. Unremarkable esophagus. No axillary adenopathy. Stable mildly enlarged 1.0 cm subcarinal node (series 2/ image 32). Stable mildly enlarged 1.3 cm right hilar node (series 2/ image 32). No additional pathologically enlarged  mediastinal or gross hilar nodes on this noncontrast scan. Lungs/Pleura: No pneumothorax. No pleural effusion. Moderate centrilobular emphysema with diffuse bronchial wall thickening. Posterior apical right upper lobe 0.8 cm centrally cystic nodule (series 4/ image 20), previously 0.8 cm using similar measurement technique, stable. Irregular spiculated 1.2 cm left upper lobe pulmonary nodule (series 4/ image 49), previously 1.2 cm using similar measurement technique, stable. Scattered centrilobular ground-glass micro nodularity in both lungs, upper lobe predominant, unchanged, probably postinflammatory. Stable parenchymal bands with volume loss, scattered internal calcifications and mild distortion at the right greater than left lung bases, most consistent with postinfectious/postinflammatory scarring. No acute consolidative airspace disease or new significant pulmonary nodules. Musculoskeletal: No aggressive appearing focal osseous lesions. Mild thoracic spondylosis. CT ABDOMEN PELVIS FINDINGS Hepatobiliary: Normal liver with no liver mass. Cholecystectomy . No biliary ductal dilatation. Pancreas: Normal, with no mass or duct dilation. Spleen: Normal size spleen. No splenic mass. Stable punctate granulomatous calcifications in the spleen. Adrenals/Urinary Tract: Stable faint right adrenal calcifications, probably granulomatous. No discrete adrenal nodules. Right nephroureteral stent is in place with the proximal pigtail portion in the right renal pelvis and the distal pigtail portion in the right bladder lumen. No hydronephrosis. There are a few scattered punctate nonobstructing 1-2 mm right renal stones. There is a faint 2 mm nonobstructing stone in the posterior lower left kidney. Simple 1.8 cm anterior upper right renal cyst. No additional contour deforming renal lesions. Kidneys are atrophic. Bladder is collapsed by indwelling Foley catheter with no appreciable bladder wall thickening or bladder stones.  Stomach/Bowel: Grossly normal stomach. Normal caliber small bowel with no small bowel wall thickening. Appendectomy. Mild distal colonic diverticulosis, with no large bowel wall thickening or pericolonic fat stranding. Vascular/Lymphatic: Atherosclerotic nonaneurysmal abdominal aorta. No pathologically enlarged lymph nodes in the abdomen or pelvis. Reproductive: Status post hysterectomy, with no abnormal findings at the vaginal cuff. No adnexal mass. Other: No pneumoperitoneum, ascites or focal fluid collection. Poorly marginated right anterior paraspinal L5-S1 soft tissue mass measures 5.2 x 1.1 x 4.9 cm in maximum dimensions (series 2/ image 93), previously 5.1 x 1.0 x 5.0 cm using similar measurement technique, unchanged. Musculoskeletal: Stable mixed lytic and sclerotic change with wide zone of transition in the right L4 and right L5 vertebral bodies and upper right sacrum. No new focal osseous lesions. Moderate degenerative disc disease in the lower lumbar spine. IMPRESSION: 1. No new or progressive metastatic disease. 2. Stable bilateral pulmonary nodules. Stable mild mediastinal and right hilar adenopathy. 3. Stable right L4-S1 bone metastasis with stable adjacent paraspinous soft tissue mass. 4. Additional findings include aortic atherosclerosis, stable 4.2 cm ascending thoracic aortic aneurysm, left main and 3 vessel coronary atherosclerosis, moderate emphysema, nonobstructing bilateral nephrolithiasis and mild distal colonic diverticulosis. Electronically Signed   By: Ilona Sorrel M.D.   On: 06/21/2016 09:40   Ct Chest Wo Contrast  Result Date: 06/21/2016 CLINICAL DATA:  Stage IV squamous cell lung carcinoma of the left upper lobe diagnosed March 2007 status post chemotherapy and immunotherapy discontinued December 2016. History of palliative radiotherapy to L5 paraspinous mass July 2016. Patient presents for restaging. EXAM: CT CHEST, ABDOMEN AND PELVIS WITHOUT CONTRAST TECHNIQUE: Multidetector CT  imaging of the chest, abdomen and pelvis was performed following the standard protocol without IV contrast. COMPARISON:  03/21/2016 CT chest, abdomen and pelvis. FINDINGS: CT CHEST FINDINGS Cardiovascular: Normal heart size. No significant pericardial fluid/thickening. Left main, left anterior descending, left circumflex and right coronary atherosclerosis. Right internal jugular MediPort terminates in the lower third of the superior vena cava. Thoracic aortic atherosclerosis with 4.2 cm ascending thoracic aortic aneurysm, stable. Normal caliber pulmonary arteries. Mediastinum/Nodes: Stable coarse subcentimeter calcifications in the bilateral thyroid lobes. Unremarkable esophagus. No axillary adenopathy. Stable mildly enlarged 1.0 cm subcarinal node (series 2/ image 32). Stable mildly enlarged 1.3 cm right hilar node (series 2/ image 32). No additional pathologically enlarged mediastinal or gross hilar nodes on this noncontrast scan. Lungs/Pleura: No pneumothorax. No pleural effusion. Moderate centrilobular emphysema with diffuse bronchial wall thickening. Posterior apical right upper lobe 0.8 cm centrally cystic nodule (series 4/ image 20), previously 0.8 cm using similar measurement technique, stable. Irregular spiculated 1.2 cm left upper lobe pulmonary nodule (series 4/ image 49), previously 1.2 cm using similar measurement technique, stable. Scattered centrilobular ground-glass micro nodularity in both lungs, upper lobe predominant, unchanged, probably postinflammatory. Stable parenchymal bands with volume loss, scattered internal calcifications and mild distortion at the right greater than left lung bases, most consistent with postinfectious/postinflammatory scarring. No acute consolidative airspace disease or new significant pulmonary nodules. Musculoskeletal: No aggressive appearing focal osseous lesions. Mild thoracic spondylosis. CT ABDOMEN PELVIS FINDINGS Hepatobiliary: Normal liver with no liver mass.  Cholecystectomy . No biliary ductal dilatation. Pancreas: Normal, with no mass or duct dilation. Spleen: Normal size spleen. No splenic mass. Stable punctate granulomatous calcifications in the spleen. Adrenals/Urinary Tract: Stable faint right adrenal calcifications, probably granulomatous. No discrete adrenal nodules. Right nephroureteral stent is in place with the proximal pigtail portion in the right renal pelvis and the distal pigtail portion in the right bladder lumen. No hydronephrosis. There are a few scattered punctate nonobstructing 1-2 mm right renal stones. There is a faint 2 mm nonobstructing stone in the posterior lower left kidney. Simple 1.8 cm anterior upper right renal cyst. No additional contour deforming renal lesions. Kidneys are atrophic. Bladder is collapsed by indwelling Foley catheter with no appreciable bladder wall thickening or bladder stones. Stomach/Bowel: Grossly normal stomach. Normal caliber small bowel with no small bowel wall thickening. Appendectomy. Mild distal colonic diverticulosis, with no large bowel wall thickening or pericolonic fat stranding. Vascular/Lymphatic: Atherosclerotic nonaneurysmal abdominal aorta. No pathologically enlarged lymph nodes in the abdomen or pelvis. Reproductive: Status post hysterectomy, with no abnormal findings at the vaginal cuff. No adnexal mass. Other: No pneumoperitoneum, ascites or focal fluid collection. Poorly marginated right anterior paraspinal L5-S1 soft tissue mass measures 5.2 x 1.1 x 4.9 cm in maximum dimensions (series 2/ image 93), previously 5.1 x 1.0 x 5.0 cm using similar measurement technique, unchanged. Musculoskeletal: Stable mixed lytic and sclerotic change with wide zone of transition in the right L4 and right L5 vertebral bodies and upper right sacrum. No new focal osseous lesions. Moderate degenerative disc disease in the lower lumbar spine. IMPRESSION: 1. No new or progressive metastatic disease. 2. Stable bilateral  pulmonary nodules. Stable mild mediastinal and right hilar adenopathy. 3. Stable right L4-S1 bone metastasis with  stable adjacent paraspinous soft tissue mass. 4. Additional findings include aortic atherosclerosis, stable 4.2 cm ascending thoracic aortic aneurysm, left main and 3 vessel coronary atherosclerosis, moderate emphysema, nonobstructing bilateral nephrolithiasis and mild distal colonic diverticulosis. Electronically Signed   By: Ilona Sorrel M.D.   On: 06/21/2016 09:40    ASSESSMENT AND PLAN:  This is a very pleasant 83 years old white female with stage IV non-small cell lung cancer diagnosed in March 2007 status post systemic chemotherapy with carboplatin and docetaxel for 6 cycles followed by observation for almost 9 years before the patient developed disease recurrence in the prone status post palliative radiotherapy. She was then treated with 2 cycles of systemic chemotherapy with carboplatin and paclitaxel discontinued secondary 20 rd. The patient also was treated with 3 cycles of immunotherapy with Nat Math (pembrolizumab) last dose was given 05/26/2015 with significant improvement in her disease. She has been observation since that time. The recent CT scan of the chest, abdomen and pelvis showed no evidence for disease progression. I discussed the scan results with the patient and her family today. I recommended for her to continue on observation with repeat CT scan of the chest, abdomen and pelvis without contrast in 3 months. For the weakness, she will continue with the physical therapy at home. The patient was advised to call immediately if she has any concerning symptoms in the interval. All questions were answered. The patient knows to call the clinic with any problems, questions or concerns. We can certainly see the patient much sooner if necessary. I spent 10 minutes counseling the patient face to face. The total time spent in the appointment was 15 minutes.  Disclaimer: This  note was dictated with voice recognition software. Similar sounding words can inadvertently be transcribed and may not be corrected upon review.

## 2016-07-04 DIAGNOSIS — I5032 Chronic diastolic (congestive) heart failure: Secondary | ICD-10-CM | POA: Diagnosis not present

## 2016-07-04 DIAGNOSIS — C3490 Malignant neoplasm of unspecified part of unspecified bronchus or lung: Secondary | ICD-10-CM | POA: Diagnosis not present

## 2016-07-04 DIAGNOSIS — N183 Chronic kidney disease, stage 3 (moderate): Secondary | ICD-10-CM | POA: Diagnosis not present

## 2016-07-04 DIAGNOSIS — I2510 Atherosclerotic heart disease of native coronary artery without angina pectoris: Secondary | ICD-10-CM | POA: Diagnosis not present

## 2016-07-04 DIAGNOSIS — Z86718 Personal history of other venous thrombosis and embolism: Secondary | ICD-10-CM | POA: Diagnosis not present

## 2016-07-04 DIAGNOSIS — I130 Hypertensive heart and chronic kidney disease with heart failure and stage 1 through stage 4 chronic kidney disease, or unspecified chronic kidney disease: Secondary | ICD-10-CM | POA: Diagnosis not present

## 2016-07-04 DIAGNOSIS — C7951 Secondary malignant neoplasm of bone: Secondary | ICD-10-CM | POA: Diagnosis not present

## 2016-07-04 DIAGNOSIS — G8311 Monoplegia of lower limb affecting right dominant side: Secondary | ICD-10-CM | POA: Diagnosis not present

## 2016-07-04 DIAGNOSIS — Z87440 Personal history of urinary (tract) infections: Secondary | ICD-10-CM | POA: Diagnosis not present

## 2016-07-04 DIAGNOSIS — Z466 Encounter for fitting and adjustment of urinary device: Secondary | ICD-10-CM | POA: Diagnosis not present

## 2016-07-05 DIAGNOSIS — I2510 Atherosclerotic heart disease of native coronary artery without angina pectoris: Secondary | ICD-10-CM | POA: Diagnosis not present

## 2016-07-05 DIAGNOSIS — Z86718 Personal history of other venous thrombosis and embolism: Secondary | ICD-10-CM | POA: Diagnosis not present

## 2016-07-05 DIAGNOSIS — Z87440 Personal history of urinary (tract) infections: Secondary | ICD-10-CM | POA: Diagnosis not present

## 2016-07-05 DIAGNOSIS — C7951 Secondary malignant neoplasm of bone: Secondary | ICD-10-CM | POA: Diagnosis not present

## 2016-07-05 DIAGNOSIS — N183 Chronic kidney disease, stage 3 (moderate): Secondary | ICD-10-CM | POA: Diagnosis not present

## 2016-07-05 DIAGNOSIS — G8311 Monoplegia of lower limb affecting right dominant side: Secondary | ICD-10-CM | POA: Diagnosis not present

## 2016-07-05 DIAGNOSIS — I5032 Chronic diastolic (congestive) heart failure: Secondary | ICD-10-CM | POA: Diagnosis not present

## 2016-07-05 DIAGNOSIS — I130 Hypertensive heart and chronic kidney disease with heart failure and stage 1 through stage 4 chronic kidney disease, or unspecified chronic kidney disease: Secondary | ICD-10-CM | POA: Diagnosis not present

## 2016-07-05 DIAGNOSIS — Z466 Encounter for fitting and adjustment of urinary device: Secondary | ICD-10-CM | POA: Diagnosis not present

## 2016-07-05 DIAGNOSIS — C3490 Malignant neoplasm of unspecified part of unspecified bronchus or lung: Secondary | ICD-10-CM | POA: Diagnosis not present

## 2016-07-08 DIAGNOSIS — I8291 Chronic embolism and thrombosis of unspecified vein: Secondary | ICD-10-CM | POA: Diagnosis not present

## 2016-07-08 DIAGNOSIS — D0220 Carcinoma in situ of unspecified bronchus and lung: Secondary | ICD-10-CM | POA: Diagnosis not present

## 2016-07-08 DIAGNOSIS — C7951 Secondary malignant neoplasm of bone: Secondary | ICD-10-CM | POA: Diagnosis not present

## 2016-07-08 DIAGNOSIS — L89153 Pressure ulcer of sacral region, stage 3: Secondary | ICD-10-CM | POA: Diagnosis not present

## 2016-07-08 DIAGNOSIS — Z87440 Personal history of urinary (tract) infections: Secondary | ICD-10-CM | POA: Diagnosis not present

## 2016-07-08 DIAGNOSIS — I2510 Atherosclerotic heart disease of native coronary artery without angina pectoris: Secondary | ICD-10-CM | POA: Diagnosis not present

## 2016-07-08 DIAGNOSIS — Z86718 Personal history of other venous thrombosis and embolism: Secondary | ICD-10-CM | POA: Diagnosis not present

## 2016-07-08 DIAGNOSIS — Z466 Encounter for fitting and adjustment of urinary device: Secondary | ICD-10-CM | POA: Diagnosis not present

## 2016-07-08 DIAGNOSIS — I509 Heart failure, unspecified: Secondary | ICD-10-CM | POA: Diagnosis not present

## 2016-07-08 DIAGNOSIS — G8311 Monoplegia of lower limb affecting right dominant side: Secondary | ICD-10-CM | POA: Diagnosis not present

## 2016-07-08 DIAGNOSIS — N183 Chronic kidney disease, stage 3 (moderate): Secondary | ICD-10-CM | POA: Diagnosis not present

## 2016-07-08 DIAGNOSIS — I5032 Chronic diastolic (congestive) heart failure: Secondary | ICD-10-CM | POA: Diagnosis not present

## 2016-07-08 DIAGNOSIS — I130 Hypertensive heart and chronic kidney disease with heart failure and stage 1 through stage 4 chronic kidney disease, or unspecified chronic kidney disease: Secondary | ICD-10-CM | POA: Diagnosis not present

## 2016-07-08 DIAGNOSIS — C3490 Malignant neoplasm of unspecified part of unspecified bronchus or lung: Secondary | ICD-10-CM | POA: Diagnosis not present

## 2016-07-09 DIAGNOSIS — G8311 Monoplegia of lower limb affecting right dominant side: Secondary | ICD-10-CM | POA: Diagnosis not present

## 2016-07-09 DIAGNOSIS — I130 Hypertensive heart and chronic kidney disease with heart failure and stage 1 through stage 4 chronic kidney disease, or unspecified chronic kidney disease: Secondary | ICD-10-CM | POA: Diagnosis not present

## 2016-07-09 DIAGNOSIS — Z87440 Personal history of urinary (tract) infections: Secondary | ICD-10-CM | POA: Diagnosis not present

## 2016-07-09 DIAGNOSIS — I2510 Atherosclerotic heart disease of native coronary artery without angina pectoris: Secondary | ICD-10-CM | POA: Diagnosis not present

## 2016-07-09 DIAGNOSIS — N183 Chronic kidney disease, stage 3 (moderate): Secondary | ICD-10-CM | POA: Diagnosis not present

## 2016-07-09 DIAGNOSIS — Z86718 Personal history of other venous thrombosis and embolism: Secondary | ICD-10-CM | POA: Diagnosis not present

## 2016-07-09 DIAGNOSIS — C3490 Malignant neoplasm of unspecified part of unspecified bronchus or lung: Secondary | ICD-10-CM | POA: Diagnosis not present

## 2016-07-09 DIAGNOSIS — C7951 Secondary malignant neoplasm of bone: Secondary | ICD-10-CM | POA: Diagnosis not present

## 2016-07-09 DIAGNOSIS — I5032 Chronic diastolic (congestive) heart failure: Secondary | ICD-10-CM | POA: Diagnosis not present

## 2016-07-09 DIAGNOSIS — Z466 Encounter for fitting and adjustment of urinary device: Secondary | ICD-10-CM | POA: Diagnosis not present

## 2016-07-11 DIAGNOSIS — Z87440 Personal history of urinary (tract) infections: Secondary | ICD-10-CM | POA: Diagnosis not present

## 2016-07-11 DIAGNOSIS — G8311 Monoplegia of lower limb affecting right dominant side: Secondary | ICD-10-CM | POA: Diagnosis not present

## 2016-07-11 DIAGNOSIS — C7951 Secondary malignant neoplasm of bone: Secondary | ICD-10-CM | POA: Diagnosis not present

## 2016-07-11 DIAGNOSIS — I5032 Chronic diastolic (congestive) heart failure: Secondary | ICD-10-CM | POA: Diagnosis not present

## 2016-07-11 DIAGNOSIS — Z86718 Personal history of other venous thrombosis and embolism: Secondary | ICD-10-CM | POA: Diagnosis not present

## 2016-07-11 DIAGNOSIS — Z466 Encounter for fitting and adjustment of urinary device: Secondary | ICD-10-CM | POA: Diagnosis not present

## 2016-07-11 DIAGNOSIS — I2510 Atherosclerotic heart disease of native coronary artery without angina pectoris: Secondary | ICD-10-CM | POA: Diagnosis not present

## 2016-07-11 DIAGNOSIS — C3490 Malignant neoplasm of unspecified part of unspecified bronchus or lung: Secondary | ICD-10-CM | POA: Diagnosis not present

## 2016-07-11 DIAGNOSIS — I130 Hypertensive heart and chronic kidney disease with heart failure and stage 1 through stage 4 chronic kidney disease, or unspecified chronic kidney disease: Secondary | ICD-10-CM | POA: Diagnosis not present

## 2016-07-11 DIAGNOSIS — N183 Chronic kidney disease, stage 3 (moderate): Secondary | ICD-10-CM | POA: Diagnosis not present

## 2016-07-12 DIAGNOSIS — I5032 Chronic diastolic (congestive) heart failure: Secondary | ICD-10-CM | POA: Diagnosis not present

## 2016-07-12 DIAGNOSIS — I2510 Atherosclerotic heart disease of native coronary artery without angina pectoris: Secondary | ICD-10-CM | POA: Diagnosis not present

## 2016-07-12 DIAGNOSIS — Z86718 Personal history of other venous thrombosis and embolism: Secondary | ICD-10-CM | POA: Diagnosis not present

## 2016-07-12 DIAGNOSIS — N183 Chronic kidney disease, stage 3 (moderate): Secondary | ICD-10-CM | POA: Diagnosis not present

## 2016-07-12 DIAGNOSIS — C7951 Secondary malignant neoplasm of bone: Secondary | ICD-10-CM | POA: Diagnosis not present

## 2016-07-12 DIAGNOSIS — G8311 Monoplegia of lower limb affecting right dominant side: Secondary | ICD-10-CM | POA: Diagnosis not present

## 2016-07-12 DIAGNOSIS — C3490 Malignant neoplasm of unspecified part of unspecified bronchus or lung: Secondary | ICD-10-CM | POA: Diagnosis not present

## 2016-07-12 DIAGNOSIS — I130 Hypertensive heart and chronic kidney disease with heart failure and stage 1 through stage 4 chronic kidney disease, or unspecified chronic kidney disease: Secondary | ICD-10-CM | POA: Diagnosis not present

## 2016-07-12 DIAGNOSIS — Z466 Encounter for fitting and adjustment of urinary device: Secondary | ICD-10-CM | POA: Diagnosis not present

## 2016-07-12 DIAGNOSIS — Z87440 Personal history of urinary (tract) infections: Secondary | ICD-10-CM | POA: Diagnosis not present

## 2016-07-12 IMAGING — CT CT CHEST W/O CM
2 of 4 series · 16 of 46 positions shown, 18 images · non-contrast
Comparison: CT chest 01/02/2015 CT abdomen 01/03/2015

CLINICAL DATA: Subsequent treatment strategy for lung carcinoma.
Last chemotherapy March 2015.

EXAM:
CT CHEST, ABDOMEN AND PELVIS WITHOUT CONTRAST
TECHNIQUE: Multidetector CT imaging of the chest, abdomen and pelvis was
performed following the standard protocol without IV contrast.

[Series 2: cap w/o w/o st · axial · non-contrast · 0.82mm/px · z∈[-636,-46]mm · 13 of 132 slices shown, 15 images]
[im 7/132  soft-tissue]
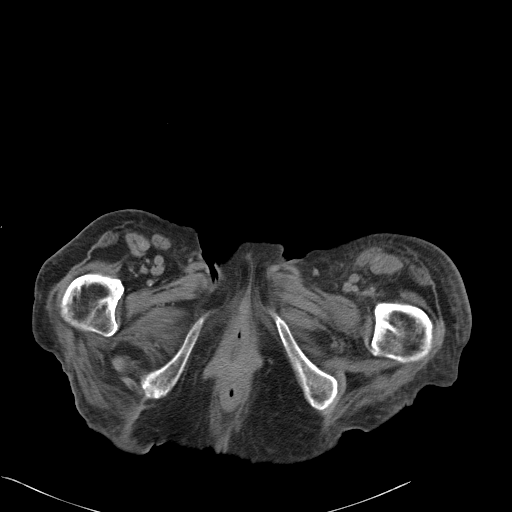
[im 7/132  bone]
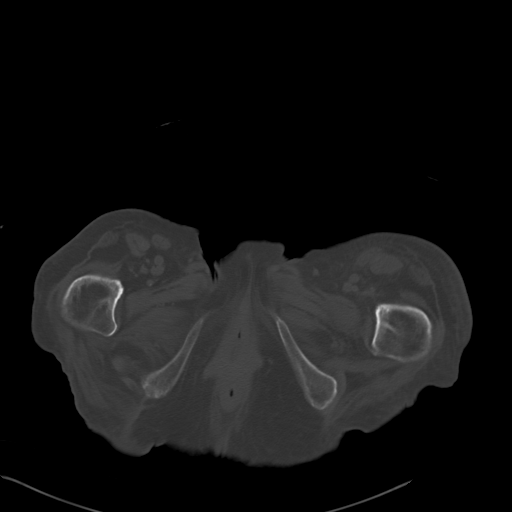
[im 20/132  soft-tissue]
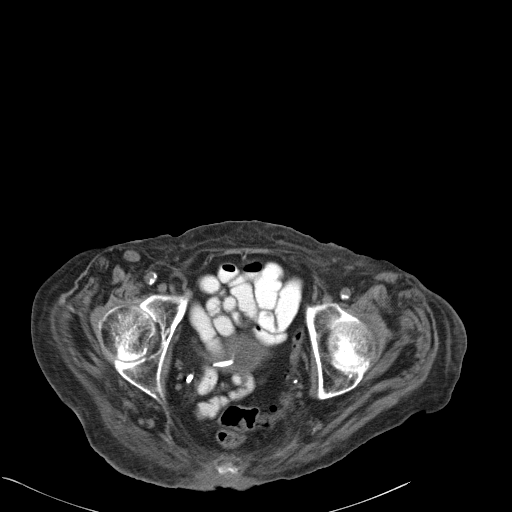
[im 27/132  soft-tissue]
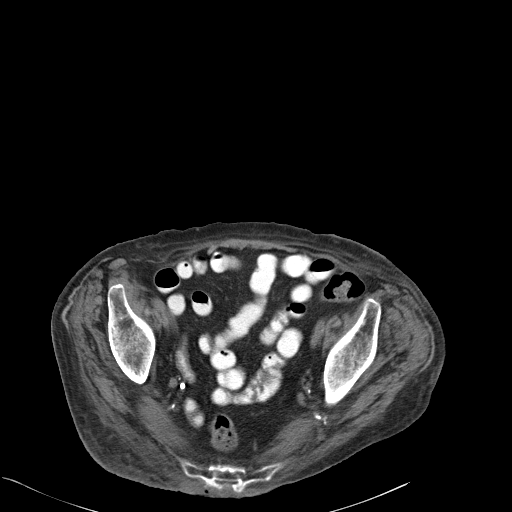
[im 40/132  soft-tissue]
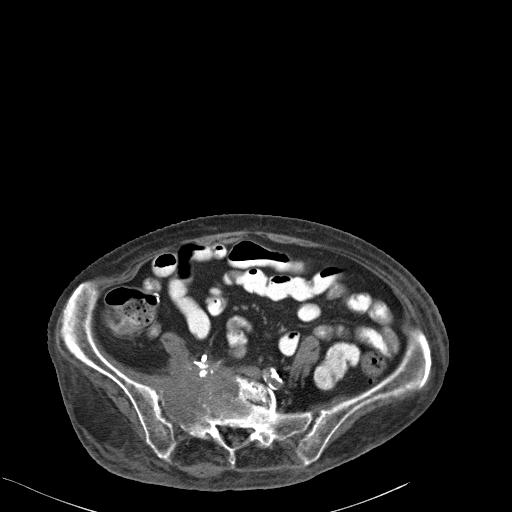
[im 46/132  soft-tissue]
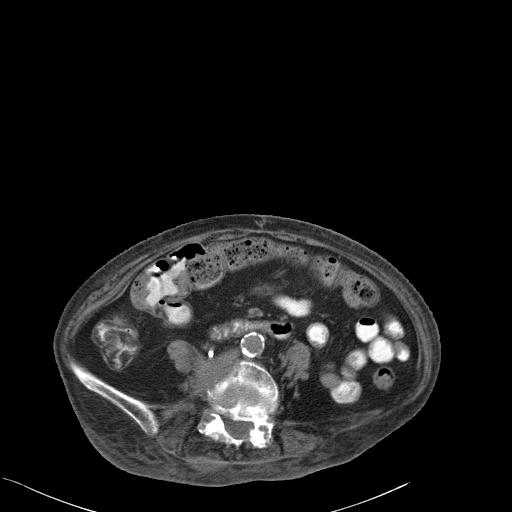
[im 59/132  soft-tissue]
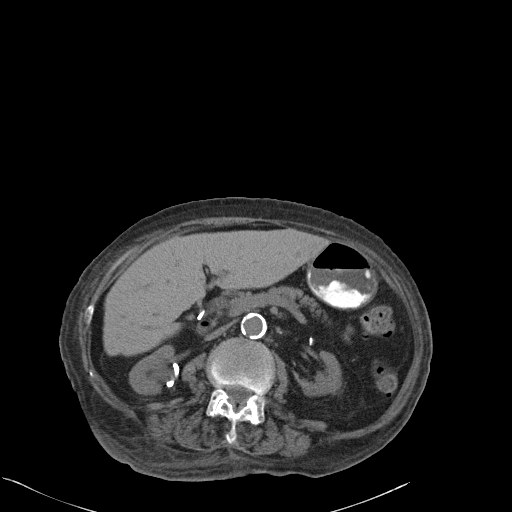
[im 66/132  soft-tissue]
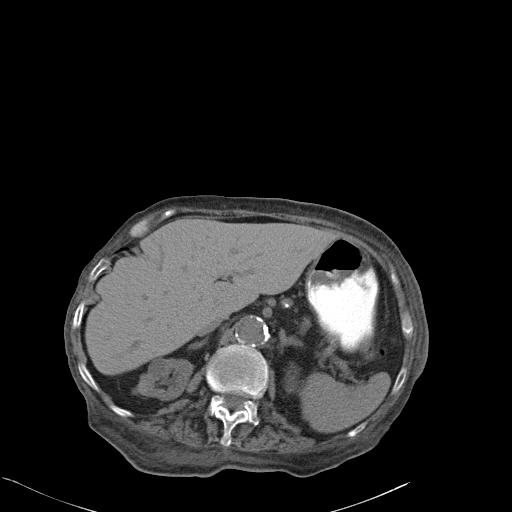
[im 73/132  soft-tissue]
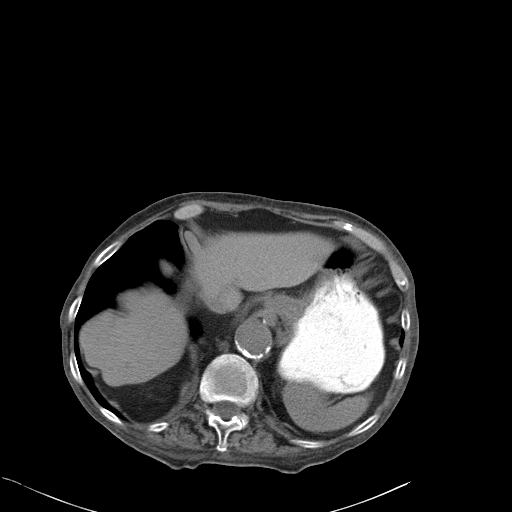
[im 86/132  soft-tissue]
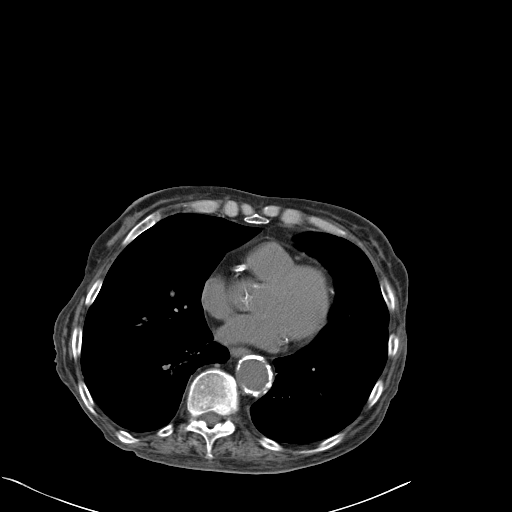
[im 86/132  bone]
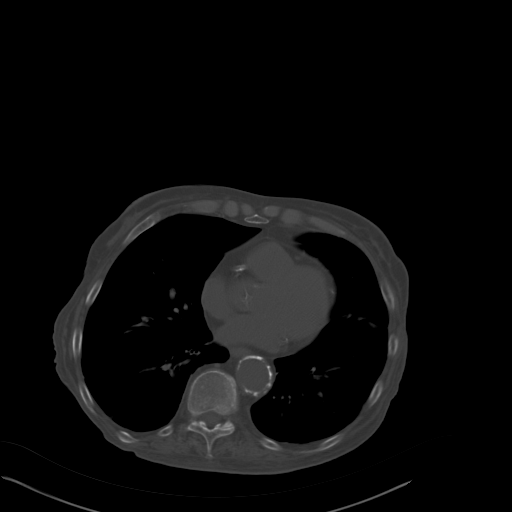
[im 92/132  soft-tissue]
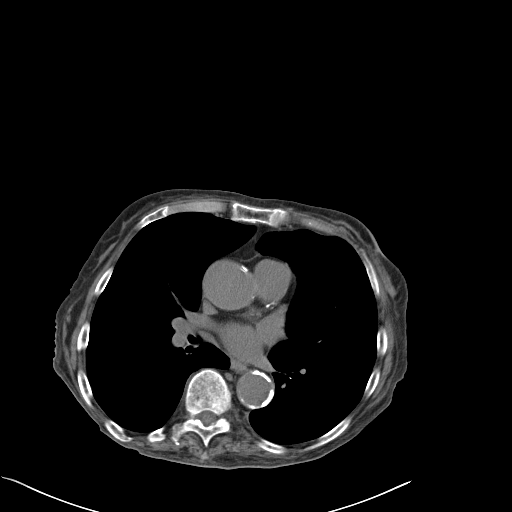
[im 105/132  soft-tissue]
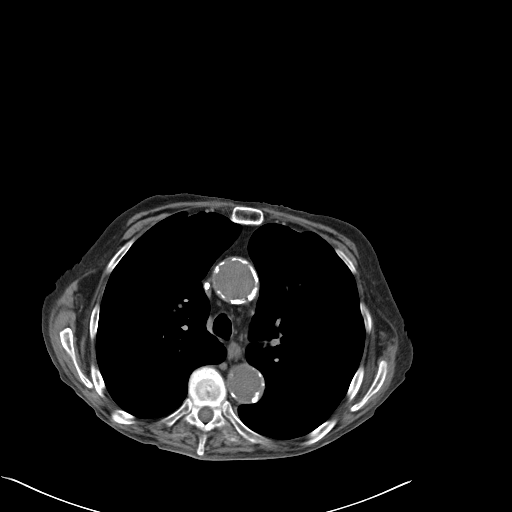
[im 112/132  soft-tissue]
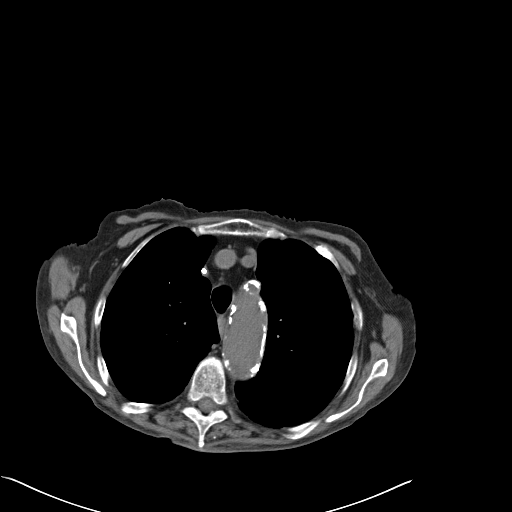
[im 125/132  soft-tissue]
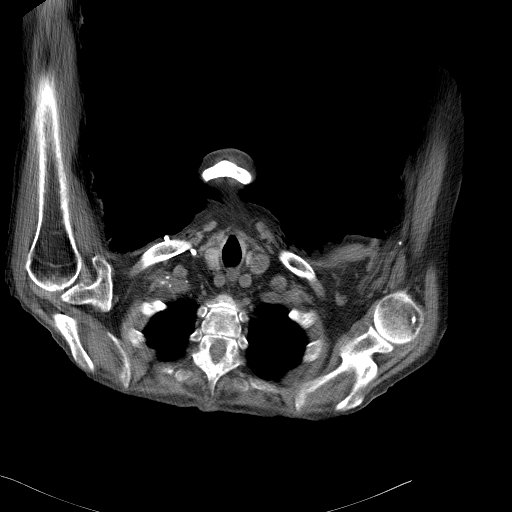

[Series 602: <mpr thick range> · coronal · 1.29mm/px · 3 of 85 slices shown]
[im 29/85  soft-tissue]
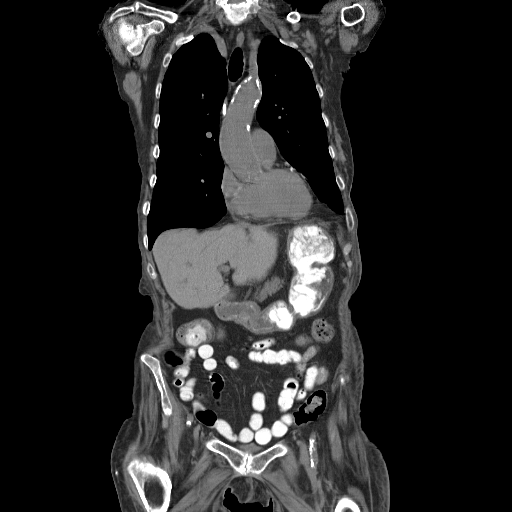
[im 38/85  soft-tissue]
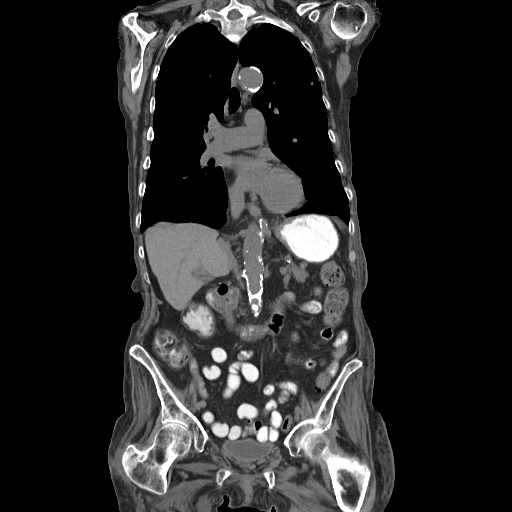
[im 47/85  soft-tissue]
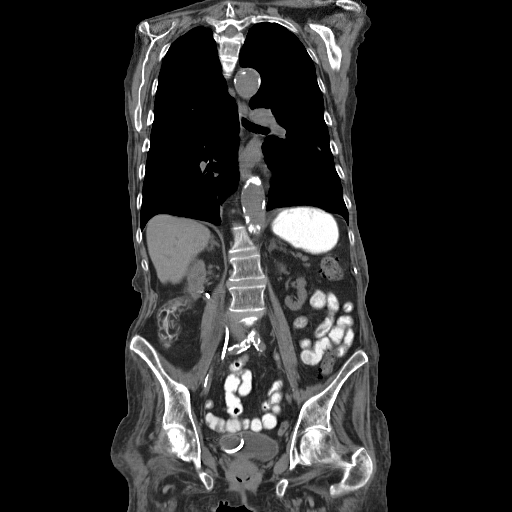

[16 of 46 positions shown; findings below may reference images not displayed]

FINDINGS: CT CHEST FINDINGS

Mediastinum/Nodes: No axillary or supraclavicular lymphadenopathy.
No mediastinal hilar lymphadenopathy. Ascending aorta measures 40
mm. No pericardial fluid. Intimal calcification aorta noted.

Lungs/Pleura: Bilateral pulmonary nodules again demonstrated. LEFT
upper lobe nodule measures 12 mm on image 22, series 4 compared to
14 mm. LEFT lower lobe nodule measures 7 mm on image 55 decreased
from 12 mm.

Peripheral RIGHT upper lobe nodule along the pleural surface
measures 5 mm image 25 series 4 more prominent than 3 mm on prior.
RIGHT upper lobe nodule measuring 3 mm is increased from 2 mm (image
26, series 4). Another RIGHT lower lobe nodule upper lobe nodule is
stable 3 mm on image 39.

CT ABDOMEN AND PELVIS FINDINGS

Hepatobiliary: No focal hepatic lesion. No biliary duct dilatation.
Gallbladder is normal. Common bile duct is normal.

Pancreas: The duodenum all diverticulum adjacent head of the
pancreas. No pancreatic duct dilatation or mass.

Spleen: Normal spleen

Adrenals/urinary tract: Adrenal glands are normal. There is a
double-J ureteral stent on the RIGHT. Mixed density renal cortical
lesions on the RIGHT are unchanged. Cortical thinning of the LEFT
kidney. Bladder is normal. No evidence of obstruction.

Stomach/Bowel: Stomach, small bowel, appendix, and cecum are normal.
The colon and rectosigmoid colon are normal.

Vascular/Lymphatic: Abdominal aorta is normal caliber with
atherosclerotic calcification. There is no retroperitoneal or
periportal lymphadenopathy. No pelvic lymphadenopathy. Improvement
in LEFT periaortic adenopathy seen on comparison exam

Reproductive: Post hysterectomy

Musculoskeletal: The destructive mass centered within the RIGHT
sacral ala measures 5.1 x 7.0 cm decreased from 5.6 x 8.5 cm.
Involvement of the L5 vertebral body on the RIGHT with soft tissue
expansion measuring 3.5 x 3.1 cm on image 85, series 2 is decreased
from 5.0 x 3.8 cm.

Other: No free fluid.
IMPRESSION: Chest Impression:

1. Overall stable pulmonary metastasis. Some nodules are increased
in size and some decreased in size.
2. No evidence of new pulmonary nodules.
3. Stable dilatation of the ascending aorta at 4 cm.

Abdomen / Pelvis Impression:

1. Interval decrease in size of the soft tissue mass of the
metastatic lesions in the RIGHT sacrum and L5 vertebral body.
2. Interval decrease in size of LEFT periaortic adenopathy.
3. No evidence disease progression.
4. RIGHT ureteral stent in place without hydronephrosis

## 2016-07-16 DIAGNOSIS — Z86718 Personal history of other venous thrombosis and embolism: Secondary | ICD-10-CM | POA: Diagnosis not present

## 2016-07-16 DIAGNOSIS — I130 Hypertensive heart and chronic kidney disease with heart failure and stage 1 through stage 4 chronic kidney disease, or unspecified chronic kidney disease: Secondary | ICD-10-CM | POA: Diagnosis not present

## 2016-07-16 DIAGNOSIS — C7951 Secondary malignant neoplasm of bone: Secondary | ICD-10-CM | POA: Diagnosis not present

## 2016-07-16 DIAGNOSIS — I5032 Chronic diastolic (congestive) heart failure: Secondary | ICD-10-CM | POA: Diagnosis not present

## 2016-07-16 DIAGNOSIS — N183 Chronic kidney disease, stage 3 (moderate): Secondary | ICD-10-CM | POA: Diagnosis not present

## 2016-07-16 DIAGNOSIS — Z466 Encounter for fitting and adjustment of urinary device: Secondary | ICD-10-CM | POA: Diagnosis not present

## 2016-07-16 DIAGNOSIS — C3490 Malignant neoplasm of unspecified part of unspecified bronchus or lung: Secondary | ICD-10-CM | POA: Diagnosis not present

## 2016-07-16 DIAGNOSIS — Z87440 Personal history of urinary (tract) infections: Secondary | ICD-10-CM | POA: Diagnosis not present

## 2016-07-16 DIAGNOSIS — G8311 Monoplegia of lower limb affecting right dominant side: Secondary | ICD-10-CM | POA: Diagnosis not present

## 2016-07-16 DIAGNOSIS — I2510 Atherosclerotic heart disease of native coronary artery without angina pectoris: Secondary | ICD-10-CM | POA: Diagnosis not present

## 2016-07-17 DIAGNOSIS — C7951 Secondary malignant neoplasm of bone: Secondary | ICD-10-CM | POA: Diagnosis not present

## 2016-07-17 DIAGNOSIS — G8311 Monoplegia of lower limb affecting right dominant side: Secondary | ICD-10-CM | POA: Diagnosis not present

## 2016-07-17 DIAGNOSIS — C3490 Malignant neoplasm of unspecified part of unspecified bronchus or lung: Secondary | ICD-10-CM | POA: Diagnosis not present

## 2016-07-17 DIAGNOSIS — Z466 Encounter for fitting and adjustment of urinary device: Secondary | ICD-10-CM | POA: Diagnosis not present

## 2016-07-17 DIAGNOSIS — I5032 Chronic diastolic (congestive) heart failure: Secondary | ICD-10-CM | POA: Diagnosis not present

## 2016-07-17 DIAGNOSIS — I2510 Atherosclerotic heart disease of native coronary artery without angina pectoris: Secondary | ICD-10-CM | POA: Diagnosis not present

## 2016-07-17 DIAGNOSIS — Z87440 Personal history of urinary (tract) infections: Secondary | ICD-10-CM | POA: Diagnosis not present

## 2016-07-17 DIAGNOSIS — N183 Chronic kidney disease, stage 3 (moderate): Secondary | ICD-10-CM | POA: Diagnosis not present

## 2016-07-17 DIAGNOSIS — Z86718 Personal history of other venous thrombosis and embolism: Secondary | ICD-10-CM | POA: Diagnosis not present

## 2016-07-17 DIAGNOSIS — I130 Hypertensive heart and chronic kidney disease with heart failure and stage 1 through stage 4 chronic kidney disease, or unspecified chronic kidney disease: Secondary | ICD-10-CM | POA: Diagnosis not present

## 2016-07-19 DIAGNOSIS — Z86718 Personal history of other venous thrombosis and embolism: Secondary | ICD-10-CM | POA: Diagnosis not present

## 2016-07-19 DIAGNOSIS — I5032 Chronic diastolic (congestive) heart failure: Secondary | ICD-10-CM | POA: Diagnosis not present

## 2016-07-19 DIAGNOSIS — G8311 Monoplegia of lower limb affecting right dominant side: Secondary | ICD-10-CM | POA: Diagnosis not present

## 2016-07-19 DIAGNOSIS — Z466 Encounter for fitting and adjustment of urinary device: Secondary | ICD-10-CM | POA: Diagnosis not present

## 2016-07-19 DIAGNOSIS — Z87440 Personal history of urinary (tract) infections: Secondary | ICD-10-CM | POA: Diagnosis not present

## 2016-07-19 DIAGNOSIS — I2510 Atherosclerotic heart disease of native coronary artery without angina pectoris: Secondary | ICD-10-CM | POA: Diagnosis not present

## 2016-07-19 DIAGNOSIS — C3490 Malignant neoplasm of unspecified part of unspecified bronchus or lung: Secondary | ICD-10-CM | POA: Diagnosis not present

## 2016-07-19 DIAGNOSIS — I130 Hypertensive heart and chronic kidney disease with heart failure and stage 1 through stage 4 chronic kidney disease, or unspecified chronic kidney disease: Secondary | ICD-10-CM | POA: Diagnosis not present

## 2016-07-19 DIAGNOSIS — N183 Chronic kidney disease, stage 3 (moderate): Secondary | ICD-10-CM | POA: Diagnosis not present

## 2016-07-19 DIAGNOSIS — C7951 Secondary malignant neoplasm of bone: Secondary | ICD-10-CM | POA: Diagnosis not present

## 2016-07-23 DIAGNOSIS — C3490 Malignant neoplasm of unspecified part of unspecified bronchus or lung: Secondary | ICD-10-CM | POA: Diagnosis not present

## 2016-07-23 DIAGNOSIS — Z466 Encounter for fitting and adjustment of urinary device: Secondary | ICD-10-CM | POA: Diagnosis not present

## 2016-07-23 DIAGNOSIS — G8311 Monoplegia of lower limb affecting right dominant side: Secondary | ICD-10-CM | POA: Diagnosis not present

## 2016-07-23 DIAGNOSIS — Z86718 Personal history of other venous thrombosis and embolism: Secondary | ICD-10-CM | POA: Diagnosis not present

## 2016-07-23 DIAGNOSIS — N183 Chronic kidney disease, stage 3 (moderate): Secondary | ICD-10-CM | POA: Diagnosis not present

## 2016-07-23 DIAGNOSIS — I2510 Atherosclerotic heart disease of native coronary artery without angina pectoris: Secondary | ICD-10-CM | POA: Diagnosis not present

## 2016-07-23 DIAGNOSIS — Z87440 Personal history of urinary (tract) infections: Secondary | ICD-10-CM | POA: Diagnosis not present

## 2016-07-23 DIAGNOSIS — I5032 Chronic diastolic (congestive) heart failure: Secondary | ICD-10-CM | POA: Diagnosis not present

## 2016-07-23 DIAGNOSIS — I130 Hypertensive heart and chronic kidney disease with heart failure and stage 1 through stage 4 chronic kidney disease, or unspecified chronic kidney disease: Secondary | ICD-10-CM | POA: Diagnosis not present

## 2016-07-23 DIAGNOSIS — C7951 Secondary malignant neoplasm of bone: Secondary | ICD-10-CM | POA: Diagnosis not present

## 2016-07-24 DIAGNOSIS — N183 Chronic kidney disease, stage 3 (moderate): Secondary | ICD-10-CM | POA: Diagnosis not present

## 2016-07-24 DIAGNOSIS — Z87440 Personal history of urinary (tract) infections: Secondary | ICD-10-CM | POA: Diagnosis not present

## 2016-07-24 DIAGNOSIS — Z466 Encounter for fitting and adjustment of urinary device: Secondary | ICD-10-CM | POA: Diagnosis not present

## 2016-07-24 DIAGNOSIS — I2510 Atherosclerotic heart disease of native coronary artery without angina pectoris: Secondary | ICD-10-CM | POA: Diagnosis not present

## 2016-07-24 DIAGNOSIS — Z86718 Personal history of other venous thrombosis and embolism: Secondary | ICD-10-CM | POA: Diagnosis not present

## 2016-07-24 DIAGNOSIS — C3490 Malignant neoplasm of unspecified part of unspecified bronchus or lung: Secondary | ICD-10-CM | POA: Diagnosis not present

## 2016-07-24 DIAGNOSIS — I5032 Chronic diastolic (congestive) heart failure: Secondary | ICD-10-CM | POA: Diagnosis not present

## 2016-07-24 DIAGNOSIS — C7951 Secondary malignant neoplasm of bone: Secondary | ICD-10-CM | POA: Diagnosis not present

## 2016-07-24 DIAGNOSIS — I130 Hypertensive heart and chronic kidney disease with heart failure and stage 1 through stage 4 chronic kidney disease, or unspecified chronic kidney disease: Secondary | ICD-10-CM | POA: Diagnosis not present

## 2016-07-24 DIAGNOSIS — G8311 Monoplegia of lower limb affecting right dominant side: Secondary | ICD-10-CM | POA: Diagnosis not present

## 2016-07-25 DIAGNOSIS — C3490 Malignant neoplasm of unspecified part of unspecified bronchus or lung: Secondary | ICD-10-CM | POA: Diagnosis not present

## 2016-07-25 DIAGNOSIS — Z86718 Personal history of other venous thrombosis and embolism: Secondary | ICD-10-CM | POA: Diagnosis not present

## 2016-07-25 DIAGNOSIS — G8311 Monoplegia of lower limb affecting right dominant side: Secondary | ICD-10-CM | POA: Diagnosis not present

## 2016-07-25 DIAGNOSIS — Z466 Encounter for fitting and adjustment of urinary device: Secondary | ICD-10-CM | POA: Diagnosis not present

## 2016-07-25 DIAGNOSIS — N183 Chronic kidney disease, stage 3 (moderate): Secondary | ICD-10-CM | POA: Diagnosis not present

## 2016-07-25 DIAGNOSIS — I2510 Atherosclerotic heart disease of native coronary artery without angina pectoris: Secondary | ICD-10-CM | POA: Diagnosis not present

## 2016-07-25 DIAGNOSIS — C7951 Secondary malignant neoplasm of bone: Secondary | ICD-10-CM | POA: Diagnosis not present

## 2016-07-25 DIAGNOSIS — I5032 Chronic diastolic (congestive) heart failure: Secondary | ICD-10-CM | POA: Diagnosis not present

## 2016-07-25 DIAGNOSIS — I130 Hypertensive heart and chronic kidney disease with heart failure and stage 1 through stage 4 chronic kidney disease, or unspecified chronic kidney disease: Secondary | ICD-10-CM | POA: Diagnosis not present

## 2016-07-25 DIAGNOSIS — Z87440 Personal history of urinary (tract) infections: Secondary | ICD-10-CM | POA: Diagnosis not present

## 2016-07-26 DIAGNOSIS — Z466 Encounter for fitting and adjustment of urinary device: Secondary | ICD-10-CM | POA: Diagnosis not present

## 2016-07-26 DIAGNOSIS — C3490 Malignant neoplasm of unspecified part of unspecified bronchus or lung: Secondary | ICD-10-CM | POA: Diagnosis not present

## 2016-07-26 DIAGNOSIS — I2510 Atherosclerotic heart disease of native coronary artery without angina pectoris: Secondary | ICD-10-CM | POA: Diagnosis not present

## 2016-07-26 DIAGNOSIS — Z87440 Personal history of urinary (tract) infections: Secondary | ICD-10-CM | POA: Diagnosis not present

## 2016-07-26 DIAGNOSIS — I130 Hypertensive heart and chronic kidney disease with heart failure and stage 1 through stage 4 chronic kidney disease, or unspecified chronic kidney disease: Secondary | ICD-10-CM | POA: Diagnosis not present

## 2016-07-26 DIAGNOSIS — I5032 Chronic diastolic (congestive) heart failure: Secondary | ICD-10-CM | POA: Diagnosis not present

## 2016-07-26 DIAGNOSIS — G8311 Monoplegia of lower limb affecting right dominant side: Secondary | ICD-10-CM | POA: Diagnosis not present

## 2016-07-26 DIAGNOSIS — N183 Chronic kidney disease, stage 3 (moderate): Secondary | ICD-10-CM | POA: Diagnosis not present

## 2016-07-26 DIAGNOSIS — Z86718 Personal history of other venous thrombosis and embolism: Secondary | ICD-10-CM | POA: Diagnosis not present

## 2016-07-26 DIAGNOSIS — C7951 Secondary malignant neoplasm of bone: Secondary | ICD-10-CM | POA: Diagnosis not present

## 2016-07-29 ENCOUNTER — Ambulatory Visit (HOSPITAL_BASED_OUTPATIENT_CLINIC_OR_DEPARTMENT_OTHER): Payer: Medicare Other

## 2016-07-29 DIAGNOSIS — C7951 Secondary malignant neoplasm of bone: Secondary | ICD-10-CM | POA: Diagnosis not present

## 2016-07-29 DIAGNOSIS — Z452 Encounter for adjustment and management of vascular access device: Secondary | ICD-10-CM | POA: Diagnosis not present

## 2016-07-29 DIAGNOSIS — Z95828 Presence of other vascular implants and grafts: Secondary | ICD-10-CM

## 2016-07-29 MED ORDER — HEPARIN SOD (PORK) LOCK FLUSH 100 UNIT/ML IV SOLN
500.0000 [IU] | Freq: Once | INTRAVENOUS | Status: AC | PRN
  Administered 2016-07-29: 500 [IU] via INTRAVENOUS
  Filled 2016-07-29: qty 5

## 2016-07-29 MED ORDER — SODIUM CHLORIDE 0.9% FLUSH
10.0000 mL | INTRAVENOUS | Status: DC | PRN
  Administered 2016-07-29: 10 mL via INTRAVENOUS
  Filled 2016-07-29: qty 10

## 2016-07-30 DIAGNOSIS — G8311 Monoplegia of lower limb affecting right dominant side: Secondary | ICD-10-CM | POA: Diagnosis not present

## 2016-07-30 DIAGNOSIS — Z86718 Personal history of other venous thrombosis and embolism: Secondary | ICD-10-CM | POA: Diagnosis not present

## 2016-07-30 DIAGNOSIS — Z87440 Personal history of urinary (tract) infections: Secondary | ICD-10-CM | POA: Diagnosis not present

## 2016-07-30 DIAGNOSIS — I5032 Chronic diastolic (congestive) heart failure: Secondary | ICD-10-CM | POA: Diagnosis not present

## 2016-07-30 DIAGNOSIS — C7951 Secondary malignant neoplasm of bone: Secondary | ICD-10-CM | POA: Diagnosis not present

## 2016-07-30 DIAGNOSIS — Z466 Encounter for fitting and adjustment of urinary device: Secondary | ICD-10-CM | POA: Diagnosis not present

## 2016-07-30 DIAGNOSIS — N183 Chronic kidney disease, stage 3 (moderate): Secondary | ICD-10-CM | POA: Diagnosis not present

## 2016-07-30 DIAGNOSIS — I130 Hypertensive heart and chronic kidney disease with heart failure and stage 1 through stage 4 chronic kidney disease, or unspecified chronic kidney disease: Secondary | ICD-10-CM | POA: Diagnosis not present

## 2016-07-30 DIAGNOSIS — C3490 Malignant neoplasm of unspecified part of unspecified bronchus or lung: Secondary | ICD-10-CM | POA: Diagnosis not present

## 2016-07-30 DIAGNOSIS — I2510 Atherosclerotic heart disease of native coronary artery without angina pectoris: Secondary | ICD-10-CM | POA: Diagnosis not present

## 2016-07-31 DIAGNOSIS — N183 Chronic kidney disease, stage 3 (moderate): Secondary | ICD-10-CM | POA: Diagnosis not present

## 2016-07-31 DIAGNOSIS — I5032 Chronic diastolic (congestive) heart failure: Secondary | ICD-10-CM | POA: Diagnosis not present

## 2016-07-31 DIAGNOSIS — Z466 Encounter for fitting and adjustment of urinary device: Secondary | ICD-10-CM | POA: Diagnosis not present

## 2016-07-31 DIAGNOSIS — C3490 Malignant neoplasm of unspecified part of unspecified bronchus or lung: Secondary | ICD-10-CM | POA: Diagnosis not present

## 2016-07-31 DIAGNOSIS — G8311 Monoplegia of lower limb affecting right dominant side: Secondary | ICD-10-CM | POA: Diagnosis not present

## 2016-07-31 DIAGNOSIS — I130 Hypertensive heart and chronic kidney disease with heart failure and stage 1 through stage 4 chronic kidney disease, or unspecified chronic kidney disease: Secondary | ICD-10-CM | POA: Diagnosis not present

## 2016-07-31 DIAGNOSIS — Z86718 Personal history of other venous thrombosis and embolism: Secondary | ICD-10-CM | POA: Diagnosis not present

## 2016-07-31 DIAGNOSIS — C7951 Secondary malignant neoplasm of bone: Secondary | ICD-10-CM | POA: Diagnosis not present

## 2016-07-31 DIAGNOSIS — Z87440 Personal history of urinary (tract) infections: Secondary | ICD-10-CM | POA: Diagnosis not present

## 2016-07-31 DIAGNOSIS — I2510 Atherosclerotic heart disease of native coronary artery without angina pectoris: Secondary | ICD-10-CM | POA: Diagnosis not present

## 2016-08-01 DIAGNOSIS — Z466 Encounter for fitting and adjustment of urinary device: Secondary | ICD-10-CM | POA: Diagnosis not present

## 2016-08-01 DIAGNOSIS — N183 Chronic kidney disease, stage 3 (moderate): Secondary | ICD-10-CM | POA: Diagnosis not present

## 2016-08-01 DIAGNOSIS — I5032 Chronic diastolic (congestive) heart failure: Secondary | ICD-10-CM | POA: Diagnosis not present

## 2016-08-01 DIAGNOSIS — G8311 Monoplegia of lower limb affecting right dominant side: Secondary | ICD-10-CM | POA: Diagnosis not present

## 2016-08-01 DIAGNOSIS — I130 Hypertensive heart and chronic kidney disease with heart failure and stage 1 through stage 4 chronic kidney disease, or unspecified chronic kidney disease: Secondary | ICD-10-CM | POA: Diagnosis not present

## 2016-08-01 DIAGNOSIS — Z86718 Personal history of other venous thrombosis and embolism: Secondary | ICD-10-CM | POA: Diagnosis not present

## 2016-08-01 DIAGNOSIS — Z87440 Personal history of urinary (tract) infections: Secondary | ICD-10-CM | POA: Diagnosis not present

## 2016-08-01 DIAGNOSIS — C3490 Malignant neoplasm of unspecified part of unspecified bronchus or lung: Secondary | ICD-10-CM | POA: Diagnosis not present

## 2016-08-01 DIAGNOSIS — I2510 Atherosclerotic heart disease of native coronary artery without angina pectoris: Secondary | ICD-10-CM | POA: Diagnosis not present

## 2016-08-01 DIAGNOSIS — C7951 Secondary malignant neoplasm of bone: Secondary | ICD-10-CM | POA: Diagnosis not present

## 2016-08-02 DIAGNOSIS — G8311 Monoplegia of lower limb affecting right dominant side: Secondary | ICD-10-CM | POA: Diagnosis not present

## 2016-08-02 DIAGNOSIS — I5032 Chronic diastolic (congestive) heart failure: Secondary | ICD-10-CM | POA: Diagnosis not present

## 2016-08-02 DIAGNOSIS — Z87440 Personal history of urinary (tract) infections: Secondary | ICD-10-CM | POA: Diagnosis not present

## 2016-08-02 DIAGNOSIS — C7951 Secondary malignant neoplasm of bone: Secondary | ICD-10-CM | POA: Diagnosis not present

## 2016-08-02 DIAGNOSIS — Z466 Encounter for fitting and adjustment of urinary device: Secondary | ICD-10-CM | POA: Diagnosis not present

## 2016-08-02 DIAGNOSIS — C3490 Malignant neoplasm of unspecified part of unspecified bronchus or lung: Secondary | ICD-10-CM | POA: Diagnosis not present

## 2016-08-02 DIAGNOSIS — Z86718 Personal history of other venous thrombosis and embolism: Secondary | ICD-10-CM | POA: Diagnosis not present

## 2016-08-02 DIAGNOSIS — I2510 Atherosclerotic heart disease of native coronary artery without angina pectoris: Secondary | ICD-10-CM | POA: Diagnosis not present

## 2016-08-02 DIAGNOSIS — N183 Chronic kidney disease, stage 3 (moderate): Secondary | ICD-10-CM | POA: Diagnosis not present

## 2016-08-02 DIAGNOSIS — I130 Hypertensive heart and chronic kidney disease with heart failure and stage 1 through stage 4 chronic kidney disease, or unspecified chronic kidney disease: Secondary | ICD-10-CM | POA: Diagnosis not present

## 2016-08-05 DIAGNOSIS — D0220 Carcinoma in situ of unspecified bronchus and lung: Secondary | ICD-10-CM | POA: Diagnosis not present

## 2016-08-05 DIAGNOSIS — I8291 Chronic embolism and thrombosis of unspecified vein: Secondary | ICD-10-CM | POA: Diagnosis not present

## 2016-08-05 DIAGNOSIS — L89153 Pressure ulcer of sacral region, stage 3: Secondary | ICD-10-CM | POA: Diagnosis not present

## 2016-08-05 DIAGNOSIS — I509 Heart failure, unspecified: Secondary | ICD-10-CM | POA: Diagnosis not present

## 2016-08-06 DIAGNOSIS — C3490 Malignant neoplasm of unspecified part of unspecified bronchus or lung: Secondary | ICD-10-CM | POA: Diagnosis not present

## 2016-08-06 DIAGNOSIS — I2510 Atherosclerotic heart disease of native coronary artery without angina pectoris: Secondary | ICD-10-CM | POA: Diagnosis not present

## 2016-08-06 DIAGNOSIS — N183 Chronic kidney disease, stage 3 (moderate): Secondary | ICD-10-CM | POA: Diagnosis not present

## 2016-08-06 DIAGNOSIS — C7951 Secondary malignant neoplasm of bone: Secondary | ICD-10-CM | POA: Diagnosis not present

## 2016-08-06 DIAGNOSIS — Z466 Encounter for fitting and adjustment of urinary device: Secondary | ICD-10-CM | POA: Diagnosis not present

## 2016-08-06 DIAGNOSIS — I130 Hypertensive heart and chronic kidney disease with heart failure and stage 1 through stage 4 chronic kidney disease, or unspecified chronic kidney disease: Secondary | ICD-10-CM | POA: Diagnosis not present

## 2016-08-06 DIAGNOSIS — I5032 Chronic diastolic (congestive) heart failure: Secondary | ICD-10-CM | POA: Diagnosis not present

## 2016-08-06 DIAGNOSIS — Z86718 Personal history of other venous thrombosis and embolism: Secondary | ICD-10-CM | POA: Diagnosis not present

## 2016-08-06 DIAGNOSIS — G8311 Monoplegia of lower limb affecting right dominant side: Secondary | ICD-10-CM | POA: Diagnosis not present

## 2016-08-06 DIAGNOSIS — Z87440 Personal history of urinary (tract) infections: Secondary | ICD-10-CM | POA: Diagnosis not present

## 2016-08-09 DIAGNOSIS — I5032 Chronic diastolic (congestive) heart failure: Secondary | ICD-10-CM | POA: Diagnosis not present

## 2016-08-09 DIAGNOSIS — C3490 Malignant neoplasm of unspecified part of unspecified bronchus or lung: Secondary | ICD-10-CM | POA: Diagnosis not present

## 2016-08-09 DIAGNOSIS — C7951 Secondary malignant neoplasm of bone: Secondary | ICD-10-CM | POA: Diagnosis not present

## 2016-08-09 DIAGNOSIS — Z87440 Personal history of urinary (tract) infections: Secondary | ICD-10-CM | POA: Diagnosis not present

## 2016-08-09 DIAGNOSIS — G8311 Monoplegia of lower limb affecting right dominant side: Secondary | ICD-10-CM | POA: Diagnosis not present

## 2016-08-09 DIAGNOSIS — Z466 Encounter for fitting and adjustment of urinary device: Secondary | ICD-10-CM | POA: Diagnosis not present

## 2016-08-09 DIAGNOSIS — I130 Hypertensive heart and chronic kidney disease with heart failure and stage 1 through stage 4 chronic kidney disease, or unspecified chronic kidney disease: Secondary | ICD-10-CM | POA: Diagnosis not present

## 2016-08-09 DIAGNOSIS — I2510 Atherosclerotic heart disease of native coronary artery without angina pectoris: Secondary | ICD-10-CM | POA: Diagnosis not present

## 2016-08-09 DIAGNOSIS — Z86718 Personal history of other venous thrombosis and embolism: Secondary | ICD-10-CM | POA: Diagnosis not present

## 2016-08-09 DIAGNOSIS — N183 Chronic kidney disease, stage 3 (moderate): Secondary | ICD-10-CM | POA: Diagnosis not present

## 2016-08-13 DIAGNOSIS — I2510 Atherosclerotic heart disease of native coronary artery without angina pectoris: Secondary | ICD-10-CM | POA: Diagnosis not present

## 2016-08-13 DIAGNOSIS — Z87440 Personal history of urinary (tract) infections: Secondary | ICD-10-CM | POA: Diagnosis not present

## 2016-08-13 DIAGNOSIS — I130 Hypertensive heart and chronic kidney disease with heart failure and stage 1 through stage 4 chronic kidney disease, or unspecified chronic kidney disease: Secondary | ICD-10-CM | POA: Diagnosis not present

## 2016-08-13 DIAGNOSIS — C3490 Malignant neoplasm of unspecified part of unspecified bronchus or lung: Secondary | ICD-10-CM | POA: Diagnosis not present

## 2016-08-13 DIAGNOSIS — I5032 Chronic diastolic (congestive) heart failure: Secondary | ICD-10-CM | POA: Diagnosis not present

## 2016-08-13 DIAGNOSIS — G8311 Monoplegia of lower limb affecting right dominant side: Secondary | ICD-10-CM | POA: Diagnosis not present

## 2016-08-13 DIAGNOSIS — N183 Chronic kidney disease, stage 3 (moderate): Secondary | ICD-10-CM | POA: Diagnosis not present

## 2016-08-13 DIAGNOSIS — Z466 Encounter for fitting and adjustment of urinary device: Secondary | ICD-10-CM | POA: Diagnosis not present

## 2016-08-13 DIAGNOSIS — Z86718 Personal history of other venous thrombosis and embolism: Secondary | ICD-10-CM | POA: Diagnosis not present

## 2016-08-13 DIAGNOSIS — C7951 Secondary malignant neoplasm of bone: Secondary | ICD-10-CM | POA: Diagnosis not present

## 2016-08-16 DIAGNOSIS — Z86718 Personal history of other venous thrombosis and embolism: Secondary | ICD-10-CM | POA: Diagnosis not present

## 2016-08-16 DIAGNOSIS — C3490 Malignant neoplasm of unspecified part of unspecified bronchus or lung: Secondary | ICD-10-CM | POA: Diagnosis not present

## 2016-08-16 DIAGNOSIS — C7951 Secondary malignant neoplasm of bone: Secondary | ICD-10-CM | POA: Diagnosis not present

## 2016-08-16 DIAGNOSIS — I2510 Atherosclerotic heart disease of native coronary artery without angina pectoris: Secondary | ICD-10-CM | POA: Diagnosis not present

## 2016-08-16 DIAGNOSIS — N183 Chronic kidney disease, stage 3 (moderate): Secondary | ICD-10-CM | POA: Diagnosis not present

## 2016-08-16 DIAGNOSIS — I5032 Chronic diastolic (congestive) heart failure: Secondary | ICD-10-CM | POA: Diagnosis not present

## 2016-08-16 DIAGNOSIS — I130 Hypertensive heart and chronic kidney disease with heart failure and stage 1 through stage 4 chronic kidney disease, or unspecified chronic kidney disease: Secondary | ICD-10-CM | POA: Diagnosis not present

## 2016-08-16 DIAGNOSIS — Z87440 Personal history of urinary (tract) infections: Secondary | ICD-10-CM | POA: Diagnosis not present

## 2016-08-16 DIAGNOSIS — Z466 Encounter for fitting and adjustment of urinary device: Secondary | ICD-10-CM | POA: Diagnosis not present

## 2016-08-16 DIAGNOSIS — G8311 Monoplegia of lower limb affecting right dominant side: Secondary | ICD-10-CM | POA: Diagnosis not present

## 2016-08-20 DIAGNOSIS — Z87440 Personal history of urinary (tract) infections: Secondary | ICD-10-CM | POA: Diagnosis not present

## 2016-08-20 DIAGNOSIS — Z466 Encounter for fitting and adjustment of urinary device: Secondary | ICD-10-CM | POA: Diagnosis not present

## 2016-08-20 DIAGNOSIS — I2510 Atherosclerotic heart disease of native coronary artery without angina pectoris: Secondary | ICD-10-CM | POA: Diagnosis not present

## 2016-08-20 DIAGNOSIS — C7951 Secondary malignant neoplasm of bone: Secondary | ICD-10-CM | POA: Diagnosis not present

## 2016-08-20 DIAGNOSIS — G8311 Monoplegia of lower limb affecting right dominant side: Secondary | ICD-10-CM | POA: Diagnosis not present

## 2016-08-20 DIAGNOSIS — I130 Hypertensive heart and chronic kidney disease with heart failure and stage 1 through stage 4 chronic kidney disease, or unspecified chronic kidney disease: Secondary | ICD-10-CM | POA: Diagnosis not present

## 2016-08-20 DIAGNOSIS — I5032 Chronic diastolic (congestive) heart failure: Secondary | ICD-10-CM | POA: Diagnosis not present

## 2016-08-20 DIAGNOSIS — N183 Chronic kidney disease, stage 3 (moderate): Secondary | ICD-10-CM | POA: Diagnosis not present

## 2016-08-20 DIAGNOSIS — Z86718 Personal history of other venous thrombosis and embolism: Secondary | ICD-10-CM | POA: Diagnosis not present

## 2016-08-20 DIAGNOSIS — C3490 Malignant neoplasm of unspecified part of unspecified bronchus or lung: Secondary | ICD-10-CM | POA: Diagnosis not present

## 2016-08-21 DIAGNOSIS — C3490 Malignant neoplasm of unspecified part of unspecified bronchus or lung: Secondary | ICD-10-CM | POA: Diagnosis not present

## 2016-08-21 DIAGNOSIS — I5032 Chronic diastolic (congestive) heart failure: Secondary | ICD-10-CM | POA: Diagnosis not present

## 2016-08-21 DIAGNOSIS — N183 Chronic kidney disease, stage 3 (moderate): Secondary | ICD-10-CM | POA: Diagnosis not present

## 2016-08-21 DIAGNOSIS — Z466 Encounter for fitting and adjustment of urinary device: Secondary | ICD-10-CM | POA: Diagnosis not present

## 2016-08-21 DIAGNOSIS — C7951 Secondary malignant neoplasm of bone: Secondary | ICD-10-CM | POA: Diagnosis not present

## 2016-08-21 DIAGNOSIS — Z87440 Personal history of urinary (tract) infections: Secondary | ICD-10-CM | POA: Diagnosis not present

## 2016-08-21 DIAGNOSIS — I2510 Atherosclerotic heart disease of native coronary artery without angina pectoris: Secondary | ICD-10-CM | POA: Diagnosis not present

## 2016-08-21 DIAGNOSIS — G8311 Monoplegia of lower limb affecting right dominant side: Secondary | ICD-10-CM | POA: Diagnosis not present

## 2016-08-21 DIAGNOSIS — Z86718 Personal history of other venous thrombosis and embolism: Secondary | ICD-10-CM | POA: Diagnosis not present

## 2016-08-21 DIAGNOSIS — I130 Hypertensive heart and chronic kidney disease with heart failure and stage 1 through stage 4 chronic kidney disease, or unspecified chronic kidney disease: Secondary | ICD-10-CM | POA: Diagnosis not present

## 2016-08-23 DIAGNOSIS — Z86718 Personal history of other venous thrombosis and embolism: Secondary | ICD-10-CM | POA: Diagnosis not present

## 2016-08-23 DIAGNOSIS — Z87440 Personal history of urinary (tract) infections: Secondary | ICD-10-CM | POA: Diagnosis not present

## 2016-08-23 DIAGNOSIS — I2510 Atherosclerotic heart disease of native coronary artery without angina pectoris: Secondary | ICD-10-CM | POA: Diagnosis not present

## 2016-08-23 DIAGNOSIS — I5032 Chronic diastolic (congestive) heart failure: Secondary | ICD-10-CM | POA: Diagnosis not present

## 2016-08-23 DIAGNOSIS — G8311 Monoplegia of lower limb affecting right dominant side: Secondary | ICD-10-CM | POA: Diagnosis not present

## 2016-08-23 DIAGNOSIS — C7951 Secondary malignant neoplasm of bone: Secondary | ICD-10-CM | POA: Diagnosis not present

## 2016-08-23 DIAGNOSIS — C3490 Malignant neoplasm of unspecified part of unspecified bronchus or lung: Secondary | ICD-10-CM | POA: Diagnosis not present

## 2016-08-23 DIAGNOSIS — Z466 Encounter for fitting and adjustment of urinary device: Secondary | ICD-10-CM | POA: Diagnosis not present

## 2016-08-23 DIAGNOSIS — N183 Chronic kidney disease, stage 3 (moderate): Secondary | ICD-10-CM | POA: Diagnosis not present

## 2016-08-23 DIAGNOSIS — I130 Hypertensive heart and chronic kidney disease with heart failure and stage 1 through stage 4 chronic kidney disease, or unspecified chronic kidney disease: Secondary | ICD-10-CM | POA: Diagnosis not present

## 2016-08-27 DIAGNOSIS — Z86718 Personal history of other venous thrombosis and embolism: Secondary | ICD-10-CM | POA: Diagnosis not present

## 2016-08-27 DIAGNOSIS — I5032 Chronic diastolic (congestive) heart failure: Secondary | ICD-10-CM | POA: Diagnosis not present

## 2016-08-27 DIAGNOSIS — Z466 Encounter for fitting and adjustment of urinary device: Secondary | ICD-10-CM | POA: Diagnosis not present

## 2016-08-27 DIAGNOSIS — I2510 Atherosclerotic heart disease of native coronary artery without angina pectoris: Secondary | ICD-10-CM | POA: Diagnosis not present

## 2016-08-27 DIAGNOSIS — I130 Hypertensive heart and chronic kidney disease with heart failure and stage 1 through stage 4 chronic kidney disease, or unspecified chronic kidney disease: Secondary | ICD-10-CM | POA: Diagnosis not present

## 2016-08-27 DIAGNOSIS — C7951 Secondary malignant neoplasm of bone: Secondary | ICD-10-CM | POA: Diagnosis not present

## 2016-08-27 DIAGNOSIS — C3490 Malignant neoplasm of unspecified part of unspecified bronchus or lung: Secondary | ICD-10-CM | POA: Diagnosis not present

## 2016-08-27 DIAGNOSIS — G8311 Monoplegia of lower limb affecting right dominant side: Secondary | ICD-10-CM | POA: Diagnosis not present

## 2016-08-27 DIAGNOSIS — N183 Chronic kidney disease, stage 3 (moderate): Secondary | ICD-10-CM | POA: Diagnosis not present

## 2016-08-27 DIAGNOSIS — Z87440 Personal history of urinary (tract) infections: Secondary | ICD-10-CM | POA: Diagnosis not present

## 2016-08-30 DIAGNOSIS — I2510 Atherosclerotic heart disease of native coronary artery without angina pectoris: Secondary | ICD-10-CM | POA: Diagnosis not present

## 2016-08-30 DIAGNOSIS — C7951 Secondary malignant neoplasm of bone: Secondary | ICD-10-CM | POA: Diagnosis not present

## 2016-08-30 DIAGNOSIS — I130 Hypertensive heart and chronic kidney disease with heart failure and stage 1 through stage 4 chronic kidney disease, or unspecified chronic kidney disease: Secondary | ICD-10-CM | POA: Diagnosis not present

## 2016-08-30 DIAGNOSIS — Z86718 Personal history of other venous thrombosis and embolism: Secondary | ICD-10-CM | POA: Diagnosis not present

## 2016-08-30 DIAGNOSIS — Z466 Encounter for fitting and adjustment of urinary device: Secondary | ICD-10-CM | POA: Diagnosis not present

## 2016-08-30 DIAGNOSIS — I5032 Chronic diastolic (congestive) heart failure: Secondary | ICD-10-CM | POA: Diagnosis not present

## 2016-08-30 DIAGNOSIS — C3490 Malignant neoplasm of unspecified part of unspecified bronchus or lung: Secondary | ICD-10-CM | POA: Diagnosis not present

## 2016-08-30 DIAGNOSIS — G8311 Monoplegia of lower limb affecting right dominant side: Secondary | ICD-10-CM | POA: Diagnosis not present

## 2016-08-30 DIAGNOSIS — N183 Chronic kidney disease, stage 3 (moderate): Secondary | ICD-10-CM | POA: Diagnosis not present

## 2016-08-30 DIAGNOSIS — Z87440 Personal history of urinary (tract) infections: Secondary | ICD-10-CM | POA: Diagnosis not present

## 2016-09-02 DIAGNOSIS — C7951 Secondary malignant neoplasm of bone: Secondary | ICD-10-CM | POA: Diagnosis not present

## 2016-09-02 DIAGNOSIS — G8311 Monoplegia of lower limb affecting right dominant side: Secondary | ICD-10-CM | POA: Diagnosis not present

## 2016-09-02 DIAGNOSIS — I2510 Atherosclerotic heart disease of native coronary artery without angina pectoris: Secondary | ICD-10-CM | POA: Diagnosis not present

## 2016-09-02 DIAGNOSIS — I5032 Chronic diastolic (congestive) heart failure: Secondary | ICD-10-CM | POA: Diagnosis not present

## 2016-09-02 DIAGNOSIS — I130 Hypertensive heart and chronic kidney disease with heart failure and stage 1 through stage 4 chronic kidney disease, or unspecified chronic kidney disease: Secondary | ICD-10-CM | POA: Diagnosis not present

## 2016-09-02 DIAGNOSIS — N183 Chronic kidney disease, stage 3 (moderate): Secondary | ICD-10-CM | POA: Diagnosis not present

## 2016-09-02 DIAGNOSIS — Z86718 Personal history of other venous thrombosis and embolism: Secondary | ICD-10-CM | POA: Diagnosis not present

## 2016-09-02 DIAGNOSIS — C3490 Malignant neoplasm of unspecified part of unspecified bronchus or lung: Secondary | ICD-10-CM | POA: Diagnosis not present

## 2016-09-02 DIAGNOSIS — Z87440 Personal history of urinary (tract) infections: Secondary | ICD-10-CM | POA: Diagnosis not present

## 2016-09-02 DIAGNOSIS — Z466 Encounter for fitting and adjustment of urinary device: Secondary | ICD-10-CM | POA: Diagnosis not present

## 2016-09-03 DIAGNOSIS — Z87440 Personal history of urinary (tract) infections: Secondary | ICD-10-CM | POA: Diagnosis not present

## 2016-09-03 DIAGNOSIS — N183 Chronic kidney disease, stage 3 (moderate): Secondary | ICD-10-CM | POA: Diagnosis not present

## 2016-09-03 DIAGNOSIS — I2510 Atherosclerotic heart disease of native coronary artery without angina pectoris: Secondary | ICD-10-CM | POA: Diagnosis not present

## 2016-09-03 DIAGNOSIS — G8311 Monoplegia of lower limb affecting right dominant side: Secondary | ICD-10-CM | POA: Diagnosis not present

## 2016-09-03 DIAGNOSIS — Z466 Encounter for fitting and adjustment of urinary device: Secondary | ICD-10-CM | POA: Diagnosis not present

## 2016-09-03 DIAGNOSIS — C3490 Malignant neoplasm of unspecified part of unspecified bronchus or lung: Secondary | ICD-10-CM | POA: Diagnosis not present

## 2016-09-03 DIAGNOSIS — I130 Hypertensive heart and chronic kidney disease with heart failure and stage 1 through stage 4 chronic kidney disease, or unspecified chronic kidney disease: Secondary | ICD-10-CM | POA: Diagnosis not present

## 2016-09-03 DIAGNOSIS — I5032 Chronic diastolic (congestive) heart failure: Secondary | ICD-10-CM | POA: Diagnosis not present

## 2016-09-03 DIAGNOSIS — Z86718 Personal history of other venous thrombosis and embolism: Secondary | ICD-10-CM | POA: Diagnosis not present

## 2016-09-03 DIAGNOSIS — C7951 Secondary malignant neoplasm of bone: Secondary | ICD-10-CM | POA: Diagnosis not present

## 2016-09-04 DIAGNOSIS — C7951 Secondary malignant neoplasm of bone: Secondary | ICD-10-CM | POA: Diagnosis not present

## 2016-09-04 DIAGNOSIS — G8311 Monoplegia of lower limb affecting right dominant side: Secondary | ICD-10-CM | POA: Diagnosis not present

## 2016-09-04 DIAGNOSIS — I2510 Atherosclerotic heart disease of native coronary artery without angina pectoris: Secondary | ICD-10-CM | POA: Diagnosis not present

## 2016-09-04 DIAGNOSIS — N183 Chronic kidney disease, stage 3 (moderate): Secondary | ICD-10-CM | POA: Diagnosis not present

## 2016-09-04 DIAGNOSIS — Z86718 Personal history of other venous thrombosis and embolism: Secondary | ICD-10-CM | POA: Diagnosis not present

## 2016-09-04 DIAGNOSIS — I5032 Chronic diastolic (congestive) heart failure: Secondary | ICD-10-CM | POA: Diagnosis not present

## 2016-09-04 DIAGNOSIS — Z466 Encounter for fitting and adjustment of urinary device: Secondary | ICD-10-CM | POA: Diagnosis not present

## 2016-09-04 DIAGNOSIS — C3490 Malignant neoplasm of unspecified part of unspecified bronchus or lung: Secondary | ICD-10-CM | POA: Diagnosis not present

## 2016-09-04 DIAGNOSIS — I130 Hypertensive heart and chronic kidney disease with heart failure and stage 1 through stage 4 chronic kidney disease, or unspecified chronic kidney disease: Secondary | ICD-10-CM | POA: Diagnosis not present

## 2016-09-04 DIAGNOSIS — Z87440 Personal history of urinary (tract) infections: Secondary | ICD-10-CM | POA: Diagnosis not present

## 2016-09-05 DIAGNOSIS — I5032 Chronic diastolic (congestive) heart failure: Secondary | ICD-10-CM | POA: Diagnosis not present

## 2016-09-05 DIAGNOSIS — Z86718 Personal history of other venous thrombosis and embolism: Secondary | ICD-10-CM | POA: Diagnosis not present

## 2016-09-05 DIAGNOSIS — C3490 Malignant neoplasm of unspecified part of unspecified bronchus or lung: Secondary | ICD-10-CM | POA: Diagnosis not present

## 2016-09-05 DIAGNOSIS — Z87440 Personal history of urinary (tract) infections: Secondary | ICD-10-CM | POA: Diagnosis not present

## 2016-09-05 DIAGNOSIS — G8311 Monoplegia of lower limb affecting right dominant side: Secondary | ICD-10-CM | POA: Diagnosis not present

## 2016-09-05 DIAGNOSIS — N183 Chronic kidney disease, stage 3 (moderate): Secondary | ICD-10-CM | POA: Diagnosis not present

## 2016-09-05 DIAGNOSIS — C7951 Secondary malignant neoplasm of bone: Secondary | ICD-10-CM | POA: Diagnosis not present

## 2016-09-05 DIAGNOSIS — Z466 Encounter for fitting and adjustment of urinary device: Secondary | ICD-10-CM | POA: Diagnosis not present

## 2016-09-05 DIAGNOSIS — I130 Hypertensive heart and chronic kidney disease with heart failure and stage 1 through stage 4 chronic kidney disease, or unspecified chronic kidney disease: Secondary | ICD-10-CM | POA: Diagnosis not present

## 2016-09-05 DIAGNOSIS — I2510 Atherosclerotic heart disease of native coronary artery without angina pectoris: Secondary | ICD-10-CM | POA: Diagnosis not present

## 2016-09-06 DIAGNOSIS — N183 Chronic kidney disease, stage 3 (moderate): Secondary | ICD-10-CM | POA: Diagnosis not present

## 2016-09-06 DIAGNOSIS — G8311 Monoplegia of lower limb affecting right dominant side: Secondary | ICD-10-CM | POA: Diagnosis not present

## 2016-09-06 DIAGNOSIS — Z87440 Personal history of urinary (tract) infections: Secondary | ICD-10-CM | POA: Diagnosis not present

## 2016-09-06 DIAGNOSIS — Z86718 Personal history of other venous thrombosis and embolism: Secondary | ICD-10-CM | POA: Diagnosis not present

## 2016-09-06 DIAGNOSIS — C7951 Secondary malignant neoplasm of bone: Secondary | ICD-10-CM | POA: Diagnosis not present

## 2016-09-06 DIAGNOSIS — Z466 Encounter for fitting and adjustment of urinary device: Secondary | ICD-10-CM | POA: Diagnosis not present

## 2016-09-06 DIAGNOSIS — I2510 Atherosclerotic heart disease of native coronary artery without angina pectoris: Secondary | ICD-10-CM | POA: Diagnosis not present

## 2016-09-06 DIAGNOSIS — I130 Hypertensive heart and chronic kidney disease with heart failure and stage 1 through stage 4 chronic kidney disease, or unspecified chronic kidney disease: Secondary | ICD-10-CM | POA: Diagnosis not present

## 2016-09-06 DIAGNOSIS — I5032 Chronic diastolic (congestive) heart failure: Secondary | ICD-10-CM | POA: Diagnosis not present

## 2016-09-06 DIAGNOSIS — C3490 Malignant neoplasm of unspecified part of unspecified bronchus or lung: Secondary | ICD-10-CM | POA: Diagnosis not present

## 2016-09-09 DIAGNOSIS — Z466 Encounter for fitting and adjustment of urinary device: Secondary | ICD-10-CM | POA: Diagnosis not present

## 2016-09-09 DIAGNOSIS — C3490 Malignant neoplasm of unspecified part of unspecified bronchus or lung: Secondary | ICD-10-CM | POA: Diagnosis not present

## 2016-09-09 DIAGNOSIS — Z86718 Personal history of other venous thrombosis and embolism: Secondary | ICD-10-CM | POA: Diagnosis not present

## 2016-09-09 DIAGNOSIS — G8311 Monoplegia of lower limb affecting right dominant side: Secondary | ICD-10-CM | POA: Diagnosis not present

## 2016-09-09 DIAGNOSIS — I2510 Atherosclerotic heart disease of native coronary artery without angina pectoris: Secondary | ICD-10-CM | POA: Diagnosis not present

## 2016-09-09 DIAGNOSIS — I5032 Chronic diastolic (congestive) heart failure: Secondary | ICD-10-CM | POA: Diagnosis not present

## 2016-09-09 DIAGNOSIS — C7951 Secondary malignant neoplasm of bone: Secondary | ICD-10-CM | POA: Diagnosis not present

## 2016-09-09 DIAGNOSIS — I130 Hypertensive heart and chronic kidney disease with heart failure and stage 1 through stage 4 chronic kidney disease, or unspecified chronic kidney disease: Secondary | ICD-10-CM | POA: Diagnosis not present

## 2016-09-09 DIAGNOSIS — N183 Chronic kidney disease, stage 3 (moderate): Secondary | ICD-10-CM | POA: Diagnosis not present

## 2016-09-09 DIAGNOSIS — Z87440 Personal history of urinary (tract) infections: Secondary | ICD-10-CM | POA: Diagnosis not present

## 2016-09-10 DIAGNOSIS — Z466 Encounter for fitting and adjustment of urinary device: Secondary | ICD-10-CM | POA: Diagnosis not present

## 2016-09-10 DIAGNOSIS — N183 Chronic kidney disease, stage 3 (moderate): Secondary | ICD-10-CM | POA: Diagnosis not present

## 2016-09-10 DIAGNOSIS — I2510 Atherosclerotic heart disease of native coronary artery without angina pectoris: Secondary | ICD-10-CM | POA: Diagnosis not present

## 2016-09-10 DIAGNOSIS — C3490 Malignant neoplasm of unspecified part of unspecified bronchus or lung: Secondary | ICD-10-CM | POA: Diagnosis not present

## 2016-09-10 DIAGNOSIS — C7951 Secondary malignant neoplasm of bone: Secondary | ICD-10-CM | POA: Diagnosis not present

## 2016-09-10 DIAGNOSIS — I130 Hypertensive heart and chronic kidney disease with heart failure and stage 1 through stage 4 chronic kidney disease, or unspecified chronic kidney disease: Secondary | ICD-10-CM | POA: Diagnosis not present

## 2016-09-10 DIAGNOSIS — Z86718 Personal history of other venous thrombosis and embolism: Secondary | ICD-10-CM | POA: Diagnosis not present

## 2016-09-10 DIAGNOSIS — G8311 Monoplegia of lower limb affecting right dominant side: Secondary | ICD-10-CM | POA: Diagnosis not present

## 2016-09-10 DIAGNOSIS — I5032 Chronic diastolic (congestive) heart failure: Secondary | ICD-10-CM | POA: Diagnosis not present

## 2016-09-10 DIAGNOSIS — Z87440 Personal history of urinary (tract) infections: Secondary | ICD-10-CM | POA: Diagnosis not present

## 2016-09-11 DIAGNOSIS — Z466 Encounter for fitting and adjustment of urinary device: Secondary | ICD-10-CM | POA: Diagnosis not present

## 2016-09-11 DIAGNOSIS — Z86718 Personal history of other venous thrombosis and embolism: Secondary | ICD-10-CM | POA: Diagnosis not present

## 2016-09-11 DIAGNOSIS — I2510 Atherosclerotic heart disease of native coronary artery without angina pectoris: Secondary | ICD-10-CM | POA: Diagnosis not present

## 2016-09-11 DIAGNOSIS — C3490 Malignant neoplasm of unspecified part of unspecified bronchus or lung: Secondary | ICD-10-CM | POA: Diagnosis not present

## 2016-09-11 DIAGNOSIS — I130 Hypertensive heart and chronic kidney disease with heart failure and stage 1 through stage 4 chronic kidney disease, or unspecified chronic kidney disease: Secondary | ICD-10-CM | POA: Diagnosis not present

## 2016-09-11 DIAGNOSIS — G8311 Monoplegia of lower limb affecting right dominant side: Secondary | ICD-10-CM | POA: Diagnosis not present

## 2016-09-11 DIAGNOSIS — I5032 Chronic diastolic (congestive) heart failure: Secondary | ICD-10-CM | POA: Diagnosis not present

## 2016-09-11 DIAGNOSIS — C7951 Secondary malignant neoplasm of bone: Secondary | ICD-10-CM | POA: Diagnosis not present

## 2016-09-11 DIAGNOSIS — Z87440 Personal history of urinary (tract) infections: Secondary | ICD-10-CM | POA: Diagnosis not present

## 2016-09-11 DIAGNOSIS — N183 Chronic kidney disease, stage 3 (moderate): Secondary | ICD-10-CM | POA: Diagnosis not present

## 2016-09-13 DIAGNOSIS — Z87440 Personal history of urinary (tract) infections: Secondary | ICD-10-CM | POA: Diagnosis not present

## 2016-09-13 DIAGNOSIS — I130 Hypertensive heart and chronic kidney disease with heart failure and stage 1 through stage 4 chronic kidney disease, or unspecified chronic kidney disease: Secondary | ICD-10-CM | POA: Diagnosis not present

## 2016-09-13 DIAGNOSIS — I2510 Atherosclerotic heart disease of native coronary artery without angina pectoris: Secondary | ICD-10-CM | POA: Diagnosis not present

## 2016-09-13 DIAGNOSIS — D0220 Carcinoma in situ of unspecified bronchus and lung: Secondary | ICD-10-CM | POA: Diagnosis not present

## 2016-09-13 DIAGNOSIS — N183 Chronic kidney disease, stage 3 (moderate): Secondary | ICD-10-CM | POA: Diagnosis not present

## 2016-09-13 DIAGNOSIS — G8311 Monoplegia of lower limb affecting right dominant side: Secondary | ICD-10-CM | POA: Diagnosis not present

## 2016-09-13 DIAGNOSIS — I8291 Chronic embolism and thrombosis of unspecified vein: Secondary | ICD-10-CM | POA: Diagnosis not present

## 2016-09-13 DIAGNOSIS — C3490 Malignant neoplasm of unspecified part of unspecified bronchus or lung: Secondary | ICD-10-CM | POA: Diagnosis not present

## 2016-09-13 DIAGNOSIS — I5032 Chronic diastolic (congestive) heart failure: Secondary | ICD-10-CM | POA: Diagnosis not present

## 2016-09-13 DIAGNOSIS — I509 Heart failure, unspecified: Secondary | ICD-10-CM | POA: Diagnosis not present

## 2016-09-13 DIAGNOSIS — L89153 Pressure ulcer of sacral region, stage 3: Secondary | ICD-10-CM | POA: Diagnosis not present

## 2016-09-13 DIAGNOSIS — C7951 Secondary malignant neoplasm of bone: Secondary | ICD-10-CM | POA: Diagnosis not present

## 2016-09-13 DIAGNOSIS — Z86718 Personal history of other venous thrombosis and embolism: Secondary | ICD-10-CM | POA: Diagnosis not present

## 2016-09-13 DIAGNOSIS — Z466 Encounter for fitting and adjustment of urinary device: Secondary | ICD-10-CM | POA: Diagnosis not present

## 2016-09-16 DIAGNOSIS — Z87440 Personal history of urinary (tract) infections: Secondary | ICD-10-CM | POA: Diagnosis not present

## 2016-09-16 DIAGNOSIS — I130 Hypertensive heart and chronic kidney disease with heart failure and stage 1 through stage 4 chronic kidney disease, or unspecified chronic kidney disease: Secondary | ICD-10-CM | POA: Diagnosis not present

## 2016-09-16 DIAGNOSIS — C7951 Secondary malignant neoplasm of bone: Secondary | ICD-10-CM | POA: Diagnosis not present

## 2016-09-16 DIAGNOSIS — I5032 Chronic diastolic (congestive) heart failure: Secondary | ICD-10-CM | POA: Diagnosis not present

## 2016-09-16 DIAGNOSIS — I2510 Atherosclerotic heart disease of native coronary artery without angina pectoris: Secondary | ICD-10-CM | POA: Diagnosis not present

## 2016-09-16 DIAGNOSIS — G8311 Monoplegia of lower limb affecting right dominant side: Secondary | ICD-10-CM | POA: Diagnosis not present

## 2016-09-16 DIAGNOSIS — Z466 Encounter for fitting and adjustment of urinary device: Secondary | ICD-10-CM | POA: Diagnosis not present

## 2016-09-16 DIAGNOSIS — Z86718 Personal history of other venous thrombosis and embolism: Secondary | ICD-10-CM | POA: Diagnosis not present

## 2016-09-16 DIAGNOSIS — N183 Chronic kidney disease, stage 3 (moderate): Secondary | ICD-10-CM | POA: Diagnosis not present

## 2016-09-16 DIAGNOSIS — C3490 Malignant neoplasm of unspecified part of unspecified bronchus or lung: Secondary | ICD-10-CM | POA: Diagnosis not present

## 2016-09-17 ENCOUNTER — Encounter: Payer: Self-pay | Admitting: Cardiovascular Disease

## 2016-09-17 DIAGNOSIS — I2510 Atherosclerotic heart disease of native coronary artery without angina pectoris: Secondary | ICD-10-CM | POA: Diagnosis not present

## 2016-09-17 DIAGNOSIS — C7951 Secondary malignant neoplasm of bone: Secondary | ICD-10-CM | POA: Diagnosis not present

## 2016-09-17 DIAGNOSIS — Z87440 Personal history of urinary (tract) infections: Secondary | ICD-10-CM | POA: Diagnosis not present

## 2016-09-17 DIAGNOSIS — G8311 Monoplegia of lower limb affecting right dominant side: Secondary | ICD-10-CM | POA: Diagnosis not present

## 2016-09-17 DIAGNOSIS — Z86718 Personal history of other venous thrombosis and embolism: Secondary | ICD-10-CM | POA: Diagnosis not present

## 2016-09-17 DIAGNOSIS — I130 Hypertensive heart and chronic kidney disease with heart failure and stage 1 through stage 4 chronic kidney disease, or unspecified chronic kidney disease: Secondary | ICD-10-CM | POA: Diagnosis not present

## 2016-09-17 DIAGNOSIS — C3490 Malignant neoplasm of unspecified part of unspecified bronchus or lung: Secondary | ICD-10-CM | POA: Diagnosis not present

## 2016-09-17 DIAGNOSIS — N183 Chronic kidney disease, stage 3 (moderate): Secondary | ICD-10-CM | POA: Diagnosis not present

## 2016-09-17 DIAGNOSIS — Z466 Encounter for fitting and adjustment of urinary device: Secondary | ICD-10-CM | POA: Diagnosis not present

## 2016-09-17 DIAGNOSIS — I5032 Chronic diastolic (congestive) heart failure: Secondary | ICD-10-CM | POA: Diagnosis not present

## 2016-09-18 DIAGNOSIS — I2510 Atherosclerotic heart disease of native coronary artery without angina pectoris: Secondary | ICD-10-CM | POA: Diagnosis not present

## 2016-09-18 DIAGNOSIS — I5032 Chronic diastolic (congestive) heart failure: Secondary | ICD-10-CM | POA: Diagnosis not present

## 2016-09-18 DIAGNOSIS — C3490 Malignant neoplasm of unspecified part of unspecified bronchus or lung: Secondary | ICD-10-CM | POA: Diagnosis not present

## 2016-09-18 DIAGNOSIS — Z466 Encounter for fitting and adjustment of urinary device: Secondary | ICD-10-CM | POA: Diagnosis not present

## 2016-09-18 DIAGNOSIS — Z87440 Personal history of urinary (tract) infections: Secondary | ICD-10-CM | POA: Diagnosis not present

## 2016-09-18 DIAGNOSIS — C7951 Secondary malignant neoplasm of bone: Secondary | ICD-10-CM | POA: Diagnosis not present

## 2016-09-18 DIAGNOSIS — I130 Hypertensive heart and chronic kidney disease with heart failure and stage 1 through stage 4 chronic kidney disease, or unspecified chronic kidney disease: Secondary | ICD-10-CM | POA: Diagnosis not present

## 2016-09-18 DIAGNOSIS — Z86718 Personal history of other venous thrombosis and embolism: Secondary | ICD-10-CM | POA: Diagnosis not present

## 2016-09-18 DIAGNOSIS — N183 Chronic kidney disease, stage 3 (moderate): Secondary | ICD-10-CM | POA: Diagnosis not present

## 2016-09-18 DIAGNOSIS — G8311 Monoplegia of lower limb affecting right dominant side: Secondary | ICD-10-CM | POA: Diagnosis not present

## 2016-09-20 DIAGNOSIS — Z87440 Personal history of urinary (tract) infections: Secondary | ICD-10-CM | POA: Diagnosis not present

## 2016-09-20 DIAGNOSIS — G8311 Monoplegia of lower limb affecting right dominant side: Secondary | ICD-10-CM | POA: Diagnosis not present

## 2016-09-20 DIAGNOSIS — I2510 Atherosclerotic heart disease of native coronary artery without angina pectoris: Secondary | ICD-10-CM | POA: Diagnosis not present

## 2016-09-20 DIAGNOSIS — I5032 Chronic diastolic (congestive) heart failure: Secondary | ICD-10-CM | POA: Diagnosis not present

## 2016-09-20 DIAGNOSIS — Z466 Encounter for fitting and adjustment of urinary device: Secondary | ICD-10-CM | POA: Diagnosis not present

## 2016-09-20 DIAGNOSIS — Z86718 Personal history of other venous thrombosis and embolism: Secondary | ICD-10-CM | POA: Diagnosis not present

## 2016-09-20 DIAGNOSIS — C7951 Secondary malignant neoplasm of bone: Secondary | ICD-10-CM | POA: Diagnosis not present

## 2016-09-20 DIAGNOSIS — I130 Hypertensive heart and chronic kidney disease with heart failure and stage 1 through stage 4 chronic kidney disease, or unspecified chronic kidney disease: Secondary | ICD-10-CM | POA: Diagnosis not present

## 2016-09-20 DIAGNOSIS — C3490 Malignant neoplasm of unspecified part of unspecified bronchus or lung: Secondary | ICD-10-CM | POA: Diagnosis not present

## 2016-09-20 DIAGNOSIS — N183 Chronic kidney disease, stage 3 (moderate): Secondary | ICD-10-CM | POA: Diagnosis not present

## 2016-09-24 DIAGNOSIS — G8311 Monoplegia of lower limb affecting right dominant side: Secondary | ICD-10-CM | POA: Diagnosis not present

## 2016-09-24 DIAGNOSIS — I5032 Chronic diastolic (congestive) heart failure: Secondary | ICD-10-CM | POA: Diagnosis not present

## 2016-09-24 DIAGNOSIS — N183 Chronic kidney disease, stage 3 (moderate): Secondary | ICD-10-CM | POA: Diagnosis not present

## 2016-09-24 DIAGNOSIS — C3490 Malignant neoplasm of unspecified part of unspecified bronchus or lung: Secondary | ICD-10-CM | POA: Diagnosis not present

## 2016-09-24 DIAGNOSIS — I130 Hypertensive heart and chronic kidney disease with heart failure and stage 1 through stage 4 chronic kidney disease, or unspecified chronic kidney disease: Secondary | ICD-10-CM | POA: Diagnosis not present

## 2016-09-24 DIAGNOSIS — I2510 Atherosclerotic heart disease of native coronary artery without angina pectoris: Secondary | ICD-10-CM | POA: Diagnosis not present

## 2016-09-24 DIAGNOSIS — Z86718 Personal history of other venous thrombosis and embolism: Secondary | ICD-10-CM | POA: Diagnosis not present

## 2016-09-24 DIAGNOSIS — Z466 Encounter for fitting and adjustment of urinary device: Secondary | ICD-10-CM | POA: Diagnosis not present

## 2016-09-24 DIAGNOSIS — Z87440 Personal history of urinary (tract) infections: Secondary | ICD-10-CM | POA: Diagnosis not present

## 2016-09-24 DIAGNOSIS — C7951 Secondary malignant neoplasm of bone: Secondary | ICD-10-CM | POA: Diagnosis not present

## 2016-09-25 DIAGNOSIS — C7951 Secondary malignant neoplasm of bone: Secondary | ICD-10-CM | POA: Diagnosis not present

## 2016-09-25 DIAGNOSIS — Z466 Encounter for fitting and adjustment of urinary device: Secondary | ICD-10-CM | POA: Diagnosis not present

## 2016-09-25 DIAGNOSIS — I2510 Atherosclerotic heart disease of native coronary artery without angina pectoris: Secondary | ICD-10-CM | POA: Diagnosis not present

## 2016-09-25 DIAGNOSIS — I130 Hypertensive heart and chronic kidney disease with heart failure and stage 1 through stage 4 chronic kidney disease, or unspecified chronic kidney disease: Secondary | ICD-10-CM | POA: Diagnosis not present

## 2016-09-25 DIAGNOSIS — Z86718 Personal history of other venous thrombosis and embolism: Secondary | ICD-10-CM | POA: Diagnosis not present

## 2016-09-25 DIAGNOSIS — N183 Chronic kidney disease, stage 3 (moderate): Secondary | ICD-10-CM | POA: Diagnosis not present

## 2016-09-25 DIAGNOSIS — Z87440 Personal history of urinary (tract) infections: Secondary | ICD-10-CM | POA: Diagnosis not present

## 2016-09-25 DIAGNOSIS — C3490 Malignant neoplasm of unspecified part of unspecified bronchus or lung: Secondary | ICD-10-CM | POA: Diagnosis not present

## 2016-09-25 DIAGNOSIS — I5032 Chronic diastolic (congestive) heart failure: Secondary | ICD-10-CM | POA: Diagnosis not present

## 2016-09-25 DIAGNOSIS — G8311 Monoplegia of lower limb affecting right dominant side: Secondary | ICD-10-CM | POA: Diagnosis not present

## 2016-09-27 ENCOUNTER — Ambulatory Visit (HOSPITAL_COMMUNITY)
Admission: RE | Admit: 2016-09-27 | Discharge: 2016-09-27 | Disposition: A | Discharge status: Home / Self Care | Payer: Medicare Other | Source: Ambulatory Visit | Attending: Internal Medicine | Admitting: Internal Medicine

## 2016-09-27 ENCOUNTER — Other Ambulatory Visit (HOSPITAL_BASED_OUTPATIENT_CLINIC_OR_DEPARTMENT_OTHER): Payer: Medicare Other

## 2016-09-27 ENCOUNTER — Ambulatory Visit (HOSPITAL_BASED_OUTPATIENT_CLINIC_OR_DEPARTMENT_OTHER): Payer: Medicare Other

## 2016-09-27 DIAGNOSIS — C3490 Malignant neoplasm of unspecified part of unspecified bronchus or lung: Secondary | ICD-10-CM | POA: Diagnosis not present

## 2016-09-27 DIAGNOSIS — G8311 Monoplegia of lower limb affecting right dominant side: Secondary | ICD-10-CM | POA: Diagnosis not present

## 2016-09-27 DIAGNOSIS — N179 Acute kidney failure, unspecified: Secondary | ICD-10-CM | POA: Diagnosis not present

## 2016-09-27 DIAGNOSIS — N183 Chronic kidney disease, stage 3 (moderate): Secondary | ICD-10-CM | POA: Insufficient documentation

## 2016-09-27 DIAGNOSIS — C7800 Secondary malignant neoplasm of unspecified lung: Secondary | ICD-10-CM | POA: Diagnosis not present

## 2016-09-27 DIAGNOSIS — Z466 Encounter for fitting and adjustment of urinary device: Secondary | ICD-10-CM | POA: Diagnosis not present

## 2016-09-27 DIAGNOSIS — I130 Hypertensive heart and chronic kidney disease with heart failure and stage 1 through stage 4 chronic kidney disease, or unspecified chronic kidney disease: Secondary | ICD-10-CM | POA: Diagnosis not present

## 2016-09-27 DIAGNOSIS — C7951 Secondary malignant neoplasm of bone: Secondary | ICD-10-CM

## 2016-09-27 DIAGNOSIS — I5032 Chronic diastolic (congestive) heart failure: Secondary | ICD-10-CM | POA: Diagnosis not present

## 2016-09-27 DIAGNOSIS — I2510 Atherosclerotic heart disease of native coronary artery without angina pectoris: Secondary | ICD-10-CM | POA: Diagnosis not present

## 2016-09-27 DIAGNOSIS — Z95828 Presence of other vascular implants and grafts: Secondary | ICD-10-CM

## 2016-09-27 DIAGNOSIS — C7802 Secondary malignant neoplasm of left lung: Secondary | ICD-10-CM | POA: Diagnosis not present

## 2016-09-27 DIAGNOSIS — Z87440 Personal history of urinary (tract) infections: Secondary | ICD-10-CM | POA: Diagnosis not present

## 2016-09-27 DIAGNOSIS — Z86718 Personal history of other venous thrombosis and embolism: Secondary | ICD-10-CM | POA: Diagnosis not present

## 2016-09-27 LAB — CBC WITH DIFFERENTIAL/PLATELET
BASO%: 1 % (ref 0.0–2.0)
BASOS ABS: 0.1 10*3/uL (ref 0.0–0.1)
EOS ABS: 0.1 10*3/uL (ref 0.0–0.5)
EOS%: 2 % (ref 0.0–7.0)
HCT: 33.5 % — ABNORMAL LOW (ref 34.8–46.6)
HGB: 10.9 g/dL — ABNORMAL LOW (ref 11.6–15.9)
LYMPH%: 6.6 % — AB (ref 14.0–49.7)
MCH: 29 pg (ref 25.1–34.0)
MCHC: 32.7 g/dL (ref 31.5–36.0)
MCV: 88.7 fL (ref 79.5–101.0)
MONO#: 0.9 10*3/uL (ref 0.1–0.9)
MONO%: 14.9 % — AB (ref 0.0–14.0)
NEUT#: 4.4 10*3/uL (ref 1.5–6.5)
NEUT%: 75.5 % (ref 38.4–76.8)
Platelets: 208 10*3/uL (ref 145–400)
RBC: 3.77 10*6/uL (ref 3.70–5.45)
RDW: 14.1 % (ref 11.2–14.5)
WBC: 5.8 10*3/uL (ref 3.9–10.3)
lymph#: 0.4 10*3/uL — ABNORMAL LOW (ref 0.9–3.3)

## 2016-09-27 LAB — COMPREHENSIVE METABOLIC PANEL
ALK PHOS: 142 U/L (ref 40–150)
ALT: 9 U/L (ref 0–55)
AST: 14 U/L (ref 5–34)
Albumin: 3.1 g/dL — ABNORMAL LOW (ref 3.5–5.0)
Anion Gap: 13 mEq/L — ABNORMAL HIGH (ref 3–11)
BUN: 49.6 mg/dL — ABNORMAL HIGH (ref 7.0–26.0)
CO2: 20 mEq/L — ABNORMAL LOW (ref 22–29)
Calcium: 8.7 mg/dL (ref 8.4–10.4)
Chloride: 105 mEq/L (ref 98–109)
Creatinine: 3.4 mg/dL (ref 0.6–1.1)
EGFR: 12 mL/min/{1.73_m2} — AB (ref >90)
GLUCOSE: 105 mg/dL (ref 70–140)
POTASSIUM: 4.8 meq/L (ref 3.5–5.1)
SODIUM: 138 meq/L (ref 136–145)
Total Bilirubin: 0.53 mg/dL (ref 0.20–1.20)
Total Protein: 7 g/dL (ref 6.4–8.3)

## 2016-09-27 MED ORDER — HEPARIN SOD (PORK) LOCK FLUSH 100 UNIT/ML IV SOLN
500.0000 [IU] | Freq: Once | INTRAVENOUS | Status: AC
  Administered 2016-09-27: 500 [IU] via INTRAVENOUS
  Filled 2016-09-27: qty 5

## 2016-09-27 MED ORDER — SODIUM CHLORIDE 0.9% FLUSH
10.0000 mL | INTRAVENOUS | Status: DC | PRN
  Administered 2016-09-27: 10 mL via INTRAVENOUS
  Filled 2016-09-27: qty 10

## 2016-09-27 NOTE — Patient Instructions (Signed)
Implanted Port Insertion, Care After °This sheet gives you information about how to care for yourself after your procedure. Your health care provider may also give you more specific instructions. If you have problems or questions, contact your health care provider. °What can I expect after the procedure? °After your procedure, it is common to have: °· Discomfort at the port insertion site. °· Bruising on the skin over the port. This should improve over 3-4 days. ° °Follow these instructions at home: °Port care °· After your port is placed, you will get a manufacturer's information card. The card has information about your port. Keep this card with you at all times. °· Take care of the port as told by your health care provider. Ask your health care provider if you or a family member can get training for taking care of the port at home. A home health care nurse may also take care of the port. °· Make sure to remember what type of port you have. °Incision care °· Follow instructions from your health care provider about how to take care of your port insertion site. Make sure you: °? Wash your hands with soap and water before you change your bandage (dressing). If soap and water are not available, use hand sanitizer. °? Change your dressing as told by your health care provider. °? Leave stitches (sutures), skin glue, or adhesive strips in place. These skin closures may need to stay in place for 2 weeks or longer. If adhesive strip edges start to loosen and curl up, you may trim the loose edges. Do not remove adhesive strips completely unless your health care provider tells you to do that. °· Check your port insertion site every day for signs of infection. Check for: °? More redness, swelling, or pain. °? More fluid or blood. °? Warmth. °? Pus or a bad smell. °General instructions °· Do not take baths, swim, or use a hot tub until your health care provider approves. °· Do not lift anything that is heavier than 10 lb (4.5  kg) for a week, or as told by your health care provider. °· Ask your health care provider when it is okay to: °? Return to work or school. °? Resume usual physical activities or sports. °· Do not drive for 24 hours if you were given a medicine to help you relax (sedative). °· Take over-the-counter and prescription medicines only as told by your health care provider. °· Wear a medical alert bracelet in case of an emergency. This will tell any health care providers that you have a port. °· Keep all follow-up visits as told by your health care provider. This is important. °Contact a health care provider if: °· You cannot flush your port with saline as directed, or you cannot draw blood from the port. °· You have a fever or chills. °· You have more redness, swelling, or pain around your port insertion site. °· You have more fluid or blood coming from your port insertion site. °· Your port insertion site feels warm to the touch. °· You have pus or a bad smell coming from the port insertion site. °Get help right away if: °· You have chest pain or shortness of breath. °· You have bleeding from your port that you cannot control. °Summary °· Take care of the port as told by your health care provider. °· Change your dressing as told by your health care provider. °· Keep all follow-up visits as told by your health care provider. °  This information is not intended to replace advice given to you by your health care provider. Make sure you discuss any questions you have with your health care provider. °Document Released: 03/10/2013 Document Revised: 04/10/2016 Document Reviewed: 04/10/2016 °Elsevier Interactive Patient Education © 2017 [employer] ° °

## 2016-09-30 ENCOUNTER — Encounter: Payer: Self-pay | Admitting: Internal Medicine

## 2016-09-30 ENCOUNTER — Telehealth: Payer: Self-pay | Admitting: Internal Medicine

## 2016-09-30 ENCOUNTER — Ambulatory Visit (HOSPITAL_BASED_OUTPATIENT_CLINIC_OR_DEPARTMENT_OTHER): Payer: Medicare Other | Admitting: Internal Medicine

## 2016-09-30 VITALS — BP 155/57 | HR 58 | Temp 97.7°F | Resp 17 | Ht 68.0 in | Wt 130.0 lb

## 2016-09-30 DIAGNOSIS — Z86718 Personal history of other venous thrombosis and embolism: Secondary | ICD-10-CM | POA: Diagnosis not present

## 2016-09-30 DIAGNOSIS — I2510 Atherosclerotic heart disease of native coronary artery without angina pectoris: Secondary | ICD-10-CM | POA: Diagnosis not present

## 2016-09-30 DIAGNOSIS — I10 Essential (primary) hypertension: Secondary | ICD-10-CM

## 2016-09-30 DIAGNOSIS — Z7189 Other specified counseling: Secondary | ICD-10-CM

## 2016-09-30 DIAGNOSIS — Z5112 Encounter for antineoplastic immunotherapy: Secondary | ICD-10-CM

## 2016-09-30 DIAGNOSIS — I130 Hypertensive heart and chronic kidney disease with heart failure and stage 1 through stage 4 chronic kidney disease, or unspecified chronic kidney disease: Secondary | ICD-10-CM | POA: Diagnosis not present

## 2016-09-30 DIAGNOSIS — N183 Chronic kidney disease, stage 3 (moderate): Secondary | ICD-10-CM

## 2016-09-30 DIAGNOSIS — C7951 Secondary malignant neoplasm of bone: Secondary | ICD-10-CM | POA: Diagnosis not present

## 2016-09-30 DIAGNOSIS — D638 Anemia in other chronic diseases classified elsewhere: Secondary | ICD-10-CM | POA: Diagnosis not present

## 2016-09-30 DIAGNOSIS — N189 Chronic kidney disease, unspecified: Secondary | ICD-10-CM

## 2016-09-30 DIAGNOSIS — Z466 Encounter for fitting and adjustment of urinary device: Secondary | ICD-10-CM | POA: Diagnosis not present

## 2016-09-30 DIAGNOSIS — I5032 Chronic diastolic (congestive) heart failure: Secondary | ICD-10-CM | POA: Diagnosis not present

## 2016-09-30 DIAGNOSIS — C3490 Malignant neoplasm of unspecified part of unspecified bronchus or lung: Secondary | ICD-10-CM | POA: Diagnosis not present

## 2016-09-30 DIAGNOSIS — G8311 Monoplegia of lower limb affecting right dominant side: Secondary | ICD-10-CM | POA: Diagnosis not present

## 2016-09-30 DIAGNOSIS — N179 Acute kidney failure, unspecified: Secondary | ICD-10-CM

## 2016-09-30 DIAGNOSIS — Z87440 Personal history of urinary (tract) infections: Secondary | ICD-10-CM | POA: Diagnosis not present

## 2016-09-30 HISTORY — DX: Other specified counseling: Z71.89

## 2016-09-30 NOTE — Progress Notes (Signed)
[facility] [phone]   [phone]  OFFICE PROGRESS NOTE  [name] Neighbors, MD [place] [address]  PRINCIPAL DIAGNOSIS: Stage IV non-small cell lung cancer diagnosed in March 2007, with disease recurrence in June 2016 with positive PDL 1 expression (50%).   PRIOR THERAPY:  1) Status post 6 cycles of systemic chemotherapy with carboplatin and docetaxel. Last dose was given January 16, 2006.  2) Palliative radiotherapy to the large right paraspinous mass with osseous invasion centered at L5 under the care of Dr. Valere Dross. 3) Systemic chemotherapy with carboplatin for AUC of 5 and paclitaxel 175 MG/M2 every 3 weeks with Neulasta support. First dose expected on 02/02/2015. Status post 2 cycles, last dose was given 02/23/2015 discontinued secondary to intolerance and mild disease progression. 4) Ketruda 200 mg IV every 3 weeks. First dose 04/14/2015. Status post 3 cycles. Treatment was discontinued after the patient had several hospitalization with recurrent infection and back abscess but were unrelated to her treatment.  CURRENT THERAPY: Observation.  DISEASE STAGE: Stage IV non-small cell lung cancer diagnosed in March of 2007.  CHEMOTHERAPY INTENT: Palliative  CURRENT # OF CHEMOTHERAPY CYCLES: 0  CURRENT ANTIEMETICS: None  CURRENT SMOKING STATUS: Nonsmoker  ORAL CHEMOTHERAPY AND CONSENT: None  CURRENT BISPHOSPHONATES USE: None  LIVING WILL AND CODE STATUS: Full code  INTERVAL HISTORY: [name] 83 y.o. female returns to the clinic today for follow-up visit accompanied by her daughter. The patient is feeling fine today with no specific complaints except for the general fatigue and weakness. She is now able to walk with a walker. She denied having any chest pain, shortness of breath, cough or hemoptysis. She has no nausea, vomiting, diarrhea or constipation. She had repeat CT scan of the chest, abdomen and pelvis performed  recently and she is here today for evaluation and discussion of her scan results.   MEDICAL HISTORY: Past Medical History:  Diagnosis Date  . A-fib ([place])   . Chronic anemia   . Chronic diastolic (congestive) heart failure ([place])   . Chronic fatigue 04/04/2015  . Chronic fatigue 04/04/2015  . CKD (chronic kidney disease)   . Coronary artery disease    a.  LHC (06/04/05): LHC done in [place] with high grade RCA => s/p BMS to RCA;  b.  Nuclear (09/14/09): Lexiscan; Inf infarct with mild peri-infarct ishemia, EF 52%; Low Risk.  Marland Kitchen DVT (deep venous thrombosis) ([place])   . Foot drop, right   . GERD (gastroesophageal reflux disease)   . Hypercholesterolemia   . Hypertension   . Ischemic cardiomyopathy    a. Echo (07/26/13): Mild LVH, EF 35-40%, diff HK, inf AK, Gr 2 DD, Tr AI, mildly dilated Ao root, MAC, mild MR, mild LAE, mod reduced RVSF.  . Non-small cell carcinoma of lung ([place])    Stage IV;spinal cancer; had chemo and radiation and immune therapy    ALLERGIES:  is allergic to amlodipine; ciprofloxacin; mirtazapine; statins; aspirin; cephalexin; hydralazine; iron; ambien [zolpidem tartrate]; lorazepam; sulfonamide derivatives; zolpidem; penicillins; shellfish allergy; and tape.  MEDICATIONS:  Current Outpatient Prescriptions  Medication Sig Dispense Refill  . acetaminophen (TYLENOL) 500 MG tablet Take 500 mg by mouth daily as needed for mild pain.    . diphenoxylate-atropine (LOMOTIL) 2.5-0.025 MG tablet Take by mouth 4 (four) times daily as needed for diarrhea or loose stools. Reported on 11/28/2015    . furosemide (LASIX) 40 MG tablet Take 40 mg by mouth daily as needed.    Marland Kitchen  loperamide (IMODIUM) 2 MG capsule Take 1 capsule (2 mg total) by mouth every 6 (six) hours as needed for diarrhea or loose stools. 30 capsule 0  . metoprolol tartrate (LOPRESSOR) 25 MG tablet Take 0.5 tablets (12.5 mg total) by mouth at bedtime. 30 tablet 0  . pantoprazole (PROTONIX) 40 MG tablet Take 40 mg by  mouth at bedtime.    Marland Kitchen SANTYL ointment Apply 1 application topically at bedtime as needed (bed sores). For bed sores     No current facility-administered medications for this visit.     SURGICAL HISTORY:  Past Surgical History:  Procedure Laterality Date  . ABDOMINAL HYSTERECTOMY    . APPENDECTOMY    . CATARACT EXTRACTION Bilateral   . CHOLECYSTECTOMY  2011  . CYSTOSCOPY     with stent placement  . CYSTOSCOPY W/ URETERAL STENT PLACEMENT Right 12/12/2015   Procedure: CYSTOSCOPY WITH RETROGRADE PYELOGRAM WITH STENT EXCHANGE;  Surgeon: Franchot Gallo, MD;  Location: [facility];  Service: Urology;  Laterality: Right;  . ERCP  2011   with stone extraction, done same day as cholecystectomy  . HEMATOMA EVACUATION  December 2006   groin  . HEMORROIDECTOMY    . LAMINECTOMY    . PORTACATH PLACEMENT    . TONSILLECTOMY      REVIEW OF SYSTEMS:  Constitutional: positive for fatigue Eyes: negative Ears, nose, mouth, throat, and face: negative Respiratory: negative Cardiovascular: negative Gastrointestinal: negative Genitourinary:negative Integument/breast: negative Hematologic/lymphatic: negative Musculoskeletal:positive for muscle weakness Neurological: negative Behavioral/Psych: negative Endocrine: negative Allergic/Immunologic: negative   PHYSICAL EXAMINATION: General appearance: alert, cooperative, fatigued and no distress Head: Normocephalic, without obvious abnormality, atraumatic Neck: no adenopathy Lymph nodes: Cervical, supraclavicular, and axillary nodes normal. Resp: clear to auscultation bilaterally Back: symmetric, no curvature. ROM normal. No CVA tenderness. Cardio: regular rate and rhythm, S1, S2 normal, no murmur, click, rub or gallop GI: soft, non-tender; bowel sounds normal; no masses,  no organomegaly Extremities: extremities normal, atraumatic, no cyanosis or edema Neurologic: Alert and oriented X 3, normal strength and tone. Normal symmetric reflexes. Normal  coordination and gait  ECOG PERFORMANCE STATUS: 2 - Symptomatic, <50% confined to bed  Blood pressure (!) 155/57, pulse (!) 58, temperature 97.7 F (36.5 C), temperature source Oral, resp. rate 17, height _0  (1.727 m), SpO2 98 %.  LABORATORY DATA: Lab Results  Component Value Date   WBC 5.8 09/27/2016   HGB 10.9 (L) 09/27/2016   HCT 33.5 (L) 09/27/2016   MCV 88.7 09/27/2016   PLT 208 09/27/2016      Chemistry      Component Value Date/Time   NA 138 09/27/2016 0836   K 4.8 09/27/2016 0836   CL 100 (L) 12/07/2015 0853   CL 104 06/19/2012 0958   CO2 20 (L) 09/27/2016 0836   BUN 49.6 (H) 09/27/2016 0836   CREATININE 3.4 (HH) 09/27/2016 0836      Component Value Date/Time   CALCIUM 8.7 09/27/2016 0836   ALKPHOS 142 09/27/2016 0836   AST 14 09/27/2016 0836   ALT 9 09/27/2016 0836   BILITOT 0.53 09/27/2016 0836       RADIOGRAPHIC STUDIES: Ct Abdomen Pelvis Wo Contrast  Result Date: 09/27/2016 CLINICAL DATA:  Squamous cell cancer, osseous metastases EXAM: CT CHEST, ABDOMEN AND PELVIS WITHOUT CONTRAST TECHNIQUE: Multidetector CT imaging of the chest, abdomen and pelvis was performed following the standard protocol without IV contrast. COMPARISON:  None. FINDINGS: CT CHEST FINDINGS Cardiovascular: Mild cardiomegaly.  No pericardial effusion. Three vessel coronary atherosclerosis. Ectasia of  the ascending thoracic aorta measuring up to 4.0 cm (series 3/ image 34). Atherosclerotic calcifications of the aortic arch. Right chest port terminates in the mid SVC. Mediastinum/Nodes: Small mediastinal lymph nodes measuring up to 9 mm in the subcarinal region (series 3/ image 34). Bilateral hilar regions are poorly evaluated in the absence of intravenous contrast. No evidence of axillary lymphadenopathy. Lungs/Pleura: Dominant spiculated left upper lobe pulmonary nodule measures up to 10 mm (series 4/ image 86), stable versus minimally increased. Additional minimal right lung nodularity is  grossly unchanged. Small right pleural effusion.  Trace left pleural effusion. Underlying right lower lobe atelectasis/scarring with associated high density, likely sequela of prior aspiration. No pleural effusion. Musculoskeletal: Mild superior endplate changes at U04. CT ABDOMEN PELVIS FINDINGS Hepatobiliary: Unenhanced liver is grossly unremarkable. Status post cholecystectomy. No intrahepatic or extrahepatic ductal dilatation. Pancreas: Within normal limits. Spleen: Within normal limits. Adrenals/Urinary Tract: Adrenal glands are grossly unremarkable. 1.4 cm hyperdense right upper pole renal lesion (series 3/image 63). Additional 1.9 cm right upper pole renal cyst. Nonobstructing right renal calculi measuring up to 4 mm in the lower pole (series 3/ image 33). Mild right hydronephrosis with indwelling double-pigtail ureteral stent. Left kidney is notable for mild cortical lobulation/ atrophy and 2 nonobstructing left lower pole renal calculi measuring up to 3 mm (series 3/ images 77). No hydronephrosis. Bladder is decompressed by indwelling Foley catheter. Stomach/Bowel: Stomach is within normal limits. No evidence bowel obstruction. Prior appendectomy. Sigmoid diverticulosis, without evidence of diverticulitis. Vascular/Lymphatic: No evidence of abdominal aortic aneurysm. Atherosclerotic calcifications of the abdominal aorta and branch vessels. No suspicious abdominopelvic lymphadenopathy. Reproductive: Status post hysterectomy. No adnexal masses. Other: No abdominopelvic ascites. Tiny fat containing left inguinal hernia (series 3/ image 113). Musculoskeletal: Destructive lesion involving the right sacrum, with soft tissue component measuring approximately 2.4 x 3.9 cm (series 3/ image 94), grossly unchanged. Moderate compression fracture deformity of the right L5 vertebral body (sagittal image 99), reflecting pathologic fracture, unchanged. Additional osseous metastasis involving the L4 vertebral body with  associated soft tissue lesion along the right aspect of the vertebral body measuring 1.9 x 2.8 cm (series 3/ image 84), previously 1.8 x 2.1 cm. IMPRESSION: 10 mm left upper lobe pulmonary metastasis, stable versus mildly increased. Osseous metastases involving L4-S1. Soft tissue component along the right aspect of L4 may have mildly progressed. Additional stable ancillary findings as above. Electronically Signed   By: Julian Hy M.D.   On: 09/27/2016 13:06   Ct Chest Wo Contrast  Result Date: 09/27/2016 CLINICAL DATA:  Squamous cell cancer, osseous metastases EXAM: CT CHEST, ABDOMEN AND PELVIS WITHOUT CONTRAST TECHNIQUE: Multidetector CT imaging of the chest, abdomen and pelvis was performed following the standard protocol without IV contrast. COMPARISON:  None. FINDINGS: CT CHEST FINDINGS Cardiovascular: Mild cardiomegaly.  No pericardial effusion. Three vessel coronary atherosclerosis. Ectasia of the ascending thoracic aorta measuring up to 4.0 cm (series 3/ image 34). Atherosclerotic calcifications of the aortic arch. Right chest port terminates in the mid SVC. Mediastinum/Nodes: Small mediastinal lymph nodes measuring up to 9 mm in the subcarinal region (series 3/ image 34). Bilateral hilar regions are poorly evaluated in the absence of intravenous contrast. No evidence of axillary lymphadenopathy. Lungs/Pleura: Dominant spiculated left upper lobe pulmonary nodule measures up to 10 mm (series 4/ image 86), stable versus minimally increased. Additional minimal right lung nodularity is grossly unchanged. Small right pleural effusion.  Trace left pleural effusion. Underlying right lower lobe atelectasis/scarring with associated high density, likely sequela of prior  aspiration. No pleural effusion. Musculoskeletal: Mild superior endplate changes at B28. CT ABDOMEN PELVIS FINDINGS Hepatobiliary: Unenhanced liver is grossly unremarkable. Status post cholecystectomy. No intrahepatic or extrahepatic ductal  dilatation. Pancreas: Within normal limits. Spleen: Within normal limits. Adrenals/Urinary Tract: Adrenal glands are grossly unremarkable. 1.4 cm hyperdense right upper pole renal lesion (series 3/image 63). Additional 1.9 cm right upper pole renal cyst. Nonobstructing right renal calculi measuring up to 4 mm in the lower pole (series 3/ image 33). Mild right hydronephrosis with indwelling double-pigtail ureteral stent. Left kidney is notable for mild cortical lobulation/ atrophy and 2 nonobstructing left lower pole renal calculi measuring up to 3 mm (series 3/ images 77). No hydronephrosis. Bladder is decompressed by indwelling Foley catheter. Stomach/Bowel: Stomach is within normal limits. No evidence bowel obstruction. Prior appendectomy. Sigmoid diverticulosis, without evidence of diverticulitis. Vascular/Lymphatic: No evidence of abdominal aortic aneurysm. Atherosclerotic calcifications of the abdominal aorta and branch vessels. No suspicious abdominopelvic lymphadenopathy. Reproductive: Status post hysterectomy. No adnexal masses. Other: No abdominopelvic ascites. Tiny fat containing left inguinal hernia (series 3/ image 113). Musculoskeletal: Destructive lesion involving the right sacrum, with soft tissue component measuring approximately 2.4 x 3.9 cm (series 3/ image 94), grossly unchanged. Moderate compression fracture deformity of the right L5 vertebral body (sagittal image 99), reflecting pathologic fracture, unchanged. Additional osseous metastasis involving the L4 vertebral body with associated soft tissue lesion along the right aspect of the vertebral body measuring 1.9 x 2.8 cm (series 3/ image 84), previously 1.8 x 2.1 cm. IMPRESSION: 10 mm left upper lobe pulmonary metastasis, stable versus mildly increased. Osseous metastases involving L4-S1. Soft tissue component along the right aspect of L4 may have mildly progressed. Additional stable ancillary findings as above. Electronically Signed   By:  Julian Hy M.D.   On: 09/27/2016 13:06    ASSESSMENT AND PLAN:  This is a very pleasant 83 years old white female with stage IV non-small cell lung cancer that was initially diagnosed in March 2007 status post systemic chemotherapy with carboplatin and docetaxel for 6 cycles followed by observation for 9 years. The patient developed disease progression with metastatic disease in the lower thoracic and lumbar spines. She was treated with palliative radiotherapy followed by 2 cycles of systemic chemotherapy with carboplatin and paclitaxel discontinued secondary to intolerance. The patient also received 3 cycles of treatment with Ketruda (pembrolizumab) and tolerating the treatment well but she took a break off the treatment and has been observation for more than he. She had repeat CT scan of the chest, abdomen and pelvis performed recently. I personally and independently reviewed the scan images and it showed mild increase of her disease. I discussed the results with the patient and her daughter and gave her the option of continuous observation and close monitoring versus consideration of palliative treatment with immunotherapy with Nat Math (pembrolizumab). The patient is interested in resuming her treatment. She is expected to start the first dose of Ketruda (pembrolizumab) next week. I will see her back for follow-up visit in 4 weeks with the start of cycle #2. For the renal insufficiency and anemia of chronic disease, we will continue to monitor the patient closely during her treatment. The patient was advised to call immediately if she has any concerning symptoms in the interval. All questions were answered. The patient knows to call the clinic with any problems, questions or concerns. We can certainly see the patient much sooner if necessary. I spent 10 minutes counseling the patient face to face. The total time spent  in the appointment was 15 minutes.  Disclaimer: This note was dictated  with voice recognition software. Similar sounding words can inadvertently be transcribed and may not be corrected upon review.

## 2016-09-30 NOTE — Telephone Encounter (Signed)
Gave patient AVS and calender per 4/30 los.

## 2016-09-30 NOTE — Progress Notes (Signed)
Patient on plan of care prior to pathways. 

## 2016-09-30 NOTE — Progress Notes (Signed)
START ON PATHWAY REGIMEN - Non-Small Cell Lung     A cycle is 21 days:     Pembrolizumab   **Always confirm dose/schedule in your pharmacy ordering system**    Patient Characteristics: Stage IV Metastatic, Non Squamous, Initial Chemotherapy/Immunotherapy, PS = 0, 1, PD-L1 Expression Positive  >= 50% (TPS) AJCC T Category: T2a Current Disease Status: Distant Metastases AJCC N Category: N2 AJCC M Category: M1c AJCC 8 Stage Grouping: IVB Histology: Non Squamous Cell ROS1 Rearrangement Status: Negative T790M Mutation Status: Not Applicable - EGFR Mutation Negative/Unknown Other Mutations/Biomarkers: No Other Actionable Mutations PD-L1 Expression Status: PD-L1 Positive >= 50% (TPS) Chemotherapy/Immunotherapy LOT: Initial Chemotherapy/Immunotherapy Molecular Targeted Therapy: Not Appropriate ALK Translocation Status: Negative Would you be surprised if this patient died  in the next year? I would NOT be surprised if this patient died in the next year EGFR Mutation Status: Negative/Wild Type BRAF [id number] Mutation Status: Negative Performance Status: PS = 0, 1  Intent of Therapy: Non-Curative / Palliative Intent, Discussed with Patient

## 2016-10-02 DIAGNOSIS — I130 Hypertensive heart and chronic kidney disease with heart failure and stage 1 through stage 4 chronic kidney disease, or unspecified chronic kidney disease: Secondary | ICD-10-CM | POA: Diagnosis not present

## 2016-10-02 DIAGNOSIS — I5032 Chronic diastolic (congestive) heart failure: Secondary | ICD-10-CM | POA: Diagnosis not present

## 2016-10-02 DIAGNOSIS — Z86718 Personal history of other venous thrombosis and embolism: Secondary | ICD-10-CM | POA: Diagnosis not present

## 2016-10-02 DIAGNOSIS — G8311 Monoplegia of lower limb affecting right dominant side: Secondary | ICD-10-CM | POA: Diagnosis not present

## 2016-10-02 DIAGNOSIS — C7951 Secondary malignant neoplasm of bone: Secondary | ICD-10-CM | POA: Diagnosis not present

## 2016-10-02 DIAGNOSIS — Z87440 Personal history of urinary (tract) infections: Secondary | ICD-10-CM | POA: Diagnosis not present

## 2016-10-02 DIAGNOSIS — I2510 Atherosclerotic heart disease of native coronary artery without angina pectoris: Secondary | ICD-10-CM | POA: Diagnosis not present

## 2016-10-02 DIAGNOSIS — Z466 Encounter for fitting and adjustment of urinary device: Secondary | ICD-10-CM | POA: Diagnosis not present

## 2016-10-02 DIAGNOSIS — C3490 Malignant neoplasm of unspecified part of unspecified bronchus or lung: Secondary | ICD-10-CM | POA: Diagnosis not present

## 2016-10-02 DIAGNOSIS — N183 Chronic kidney disease, stage 3 (moderate): Secondary | ICD-10-CM | POA: Diagnosis not present

## 2016-10-03 DIAGNOSIS — I130 Hypertensive heart and chronic kidney disease with heart failure and stage 1 through stage 4 chronic kidney disease, or unspecified chronic kidney disease: Secondary | ICD-10-CM | POA: Diagnosis not present

## 2016-10-03 DIAGNOSIS — C7951 Secondary malignant neoplasm of bone: Secondary | ICD-10-CM | POA: Diagnosis not present

## 2016-10-03 DIAGNOSIS — N183 Chronic kidney disease, stage 3 (moderate): Secondary | ICD-10-CM | POA: Diagnosis not present

## 2016-10-03 DIAGNOSIS — G8311 Monoplegia of lower limb affecting right dominant side: Secondary | ICD-10-CM | POA: Diagnosis not present

## 2016-10-03 DIAGNOSIS — Z87440 Personal history of urinary (tract) infections: Secondary | ICD-10-CM | POA: Diagnosis not present

## 2016-10-03 DIAGNOSIS — Z86718 Personal history of other venous thrombosis and embolism: Secondary | ICD-10-CM | POA: Diagnosis not present

## 2016-10-03 DIAGNOSIS — Z466 Encounter for fitting and adjustment of urinary device: Secondary | ICD-10-CM | POA: Diagnosis not present

## 2016-10-03 DIAGNOSIS — I5032 Chronic diastolic (congestive) heart failure: Secondary | ICD-10-CM | POA: Diagnosis not present

## 2016-10-03 DIAGNOSIS — I2510 Atherosclerotic heart disease of native coronary artery without angina pectoris: Secondary | ICD-10-CM | POA: Diagnosis not present

## 2016-10-03 DIAGNOSIS — C3490 Malignant neoplasm of unspecified part of unspecified bronchus or lung: Secondary | ICD-10-CM | POA: Diagnosis not present

## 2016-10-04 ENCOUNTER — Ambulatory Visit (INDEPENDENT_AMBULATORY_CARE_PROVIDER_SITE_OTHER): Payer: Medicare Other | Admitting: Cardiovascular Disease

## 2016-10-04 ENCOUNTER — Encounter: Payer: Self-pay | Admitting: Cardiovascular Disease

## 2016-10-04 VITALS — BP 124/74 | HR 58 | Ht 68.0 in | Wt 128.2 lb

## 2016-10-04 DIAGNOSIS — I2510 Atherosclerotic heart disease of native coronary artery without angina pectoris: Secondary | ICD-10-CM | POA: Diagnosis not present

## 2016-10-04 DIAGNOSIS — I10 Essential (primary) hypertension: Secondary | ICD-10-CM | POA: Diagnosis not present

## 2016-10-04 NOTE — Patient Instructions (Signed)

## 2016-10-04 NOTE — Progress Notes (Signed)
Cardiology Office Note Date:  10/04/2016   ID:  [name], DOB Jan 21, 1934, MRN [id number]  PCP:  [name] Neighbors, MD  Cardiologist:  Sherren Mocha, MD    Chief Complaint  Patient presents with  . Follow-up    CAD   History of Present Illness: [name] is a 83 y.o. female who presents for follow-up of CAD.  The patient has coronary artery disease and she has undergone coronary stenting in 2007 when she was treated with a bare-metal stent in the right coronary artery. She has had longstanding hypertension and chronic kidney disease.    The patient has stage IV non-small cell lung cancer initially diagnosed in 2007 with disease recurrence in 2016. She is going to start back on immune-directed therapy again next week.   Since she was here last, Toprol has been increased. BP has been up and down, but overall doing ok. The patient is here with her daughter today. She continues to remain remarkably stable from a cardiac perspective. She denies chest pain, shortness of breath, leg swelling, orthopnea, or PND. She's been working hard to regain some of her strength.  Past Medical History:  Diagnosis Date  . A-fib ([place])   . Chronic anemia   . Chronic diastolic (congestive) heart failure ([place])   . Chronic fatigue 04/04/2015  . Chronic fatigue 04/04/2015  . CKD (chronic kidney disease)   . Coronary artery disease    a.  LHC (06/04/05): LHC done in [place] with high grade RCA => s/p BMS to RCA;  b.  Nuclear (09/14/09): Lexiscan; Inf infarct with mild peri-infarct ishemia, EF 52%; Low Risk.  Marland Kitchen DVT (deep venous thrombosis) ([place])   . Foot drop, right   . GERD (gastroesophageal reflux disease)   . Goals of care, counseling/discussion 09/30/2016  . Hypercholesterolemia   . Hypertension   . Ischemic cardiomyopathy    a. Echo (07/26/13): Mild LVH, EF 35-40%, diff HK, inf AK, Gr 2 DD, Tr AI, mildly dilated Ao root, MAC, mild MR, mild LAE, mod reduced RVSF.  . Non-small cell carcinoma  of lung ([place])    Stage IV;spinal cancer; had chemo and radiation and immune therapy    Past Surgical History:  Procedure Laterality Date  . ABDOMINAL HYSTERECTOMY    . APPENDECTOMY    . CATARACT EXTRACTION Bilateral   . CHOLECYSTECTOMY  2011  . CYSTOSCOPY     with stent placement  . CYSTOSCOPY W/ URETERAL STENT PLACEMENT Right 12/12/2015   Procedure: CYSTOSCOPY WITH RETROGRADE PYELOGRAM WITH STENT EXCHANGE;  Surgeon: Franchot Gallo, MD;  Location: [facility];  Service: Urology;  Laterality: Right;  . ERCP  2011   with stone extraction, done same day as cholecystectomy  . HEMATOMA EVACUATION  December 2006   groin  . HEMORROIDECTOMY    . LAMINECTOMY    . PORTACATH PLACEMENT    . TONSILLECTOMY      Current Outpatient Prescriptions  Medication Sig Dispense Refill  . acetaminophen (TYLENOL) 500 MG tablet Take 500 mg by mouth daily as needed for mild pain.    . diphenoxylate-atropine (LOMOTIL) 2.5-0.025 MG tablet Take by mouth 4 (four) times daily as needed for diarrhea or loose stools. Reported on 11/28/2015    . furosemide (LASIX) 40 MG tablet Take 40 mg by mouth daily as needed.    . loperamide (IMODIUM) 2 MG capsule Take 1 capsule (2 mg total) by mouth every 6 (six) hours as needed for diarrhea or loose stools. 30 capsule 0  .  metoprolol tartrate (LOPRESSOR) 25 MG tablet Take 0.5 tablets (12.5 mg total) by mouth at bedtime. 30 tablet 0  . pantoprazole (PROTONIX) 40 MG tablet Take 40 mg by mouth at bedtime.    Marland Kitchen SANTYL ointment Apply 1 application topically at bedtime as needed (bed sores). For bed sores     No current facility-administered medications for this visit.    Allergies:   Amlodipine; Ciprofloxacin; Mirtazapine; Statins; Aspirin; Cephalexin; Hydralazine; Iron; Ambien [zolpidem tartrate]; Lorazepam; Sulfonamide derivatives; Zolpidem; Penicillins; Shellfish allergy; and Tape   Social History:  The patient  reports that she quit smoking about 25 years ago. Her smoking use  included Cigarettes. She has a 35.00 pack-year smoking history. She has never used smokeless tobacco. She reports that she does not drink alcohol or use drugs.   Family History:  The patient's  family history includes Heart failure in her mother; Lung cancer in her brother and sister; Stomach cancer in her brother.   ROS:  Please see the history of present illness. All other systems are reviewed and negative.   PHYSICAL EXAM: VS:  BP 124/74   Pulse (!) 58   Ht _0  (1.727 m)   Wt 128 lb 4 oz (58.2 kg)   SpO2 93%   BMI 19.50 kg/m  , BMI Body mass index is 19.5 kg/m. GEN: Well nourished, well developed, in no acute distress  HEENT: normal  Neck: no JVD, no masses. No carotid bruits Cardiac: RRR without murmur or gallop                Respiratory:  clear to auscultation bilaterally, normal work of breathing GI: soft, nontender, nondistended, + BS MS: no deformity or atrophy  Ext: no pretibial edema. There is swelling around the right upper thigh and hip area Skin: warm and dry, no rash Neuro:  Strength and sensation are intact Psych: euthymic mood, full affect  EKG:  EKG is ordered today. The ekg ordered today shows NSR 83 bpm, T wave abnormality consider anterior/lateral ischemia  Recent Labs: 11/28/2015: Magnesium 1.5 03/20/2016: TSH 3.501 09/27/2016: ALT 9; BUN 49.6; Creatinine 3.4; HGB 10.9; Platelets 208; Potassium 4.8; Sodium 138   Lipid Panel     Component Value Date/Time   CHOL 248 (H) 07/29/2014 0650   TRIG 179 (H) 07/29/2014 0650   HDL 32 (L) 07/29/2014 0650   CHOLHDL 7.8 07/29/2014 0650   VLDL 36 07/29/2014 0650   LDLCALC 180 (H) 07/29/2014 0650   LDLDIRECT 209.4 05/19/2008 1024      Wt Readings from Last 3 Encounters:  10/04/16 128 lb 4 oz (58.2 kg)  09/30/16 130 lb (59 kg)  12/12/15 130 lb (59 kg)     Cardiac Studies Reviewed: Additional studies/ records that were reviewed today include: ECHO 06/2015  Study Conclusions  - Left ventricle: The cavity  size was normal. Wall thickness was  normal. Systolic function was normal. The estimated ejection  fraction was in the range of 50% to 55%. There is enhanced  echogenicity, thinning and akinesis of the inferior wall  consistent with prior infarct. Doppler parameters are consistent  with abnormal left ventricular relaxation (grade 1 diastolic  dysfunction). The E/e&' ratio is >15, suggesting elevated LV  filling pressure. - Aortic valve: Trileaflet; mildly calcified leaflets.  Transvalvular velocity was minimally increased. There was no  stenosis. There was moderate regurgitation. Valve area (VTI):  3.26 cm^2. Valve area (Vmax): 2.39 cm^2. Valve area (Vmean): 2.4  cm^2. - Mitral valve: Mildly thickened leaflets .  There was moderate  regurgitation. - Left atrium: The atrium was at the upper limits of normal in  size. - Inferior vena cava: The vessel was normal in size. The  respirophasic diameter changes were in the normal range (= 50%),  consistent with normal central venous pressure. - Pericardium, extracardiac: There was a left pleural effusion.  Impressions:  - Compared to a prior echo in 07/2014, the EF is stable at 50-55%.  There is diastolic dysfunction with elevated LV filling pressure.  There appears to be scar of the inferior wall.  ASSESSMENT AND PLAN: 1. CAD, native vessel, without angina 2. Hypertension with CKD IV 3. Non-small cell lung cancer, metastatic   The patient is doing well from a cardiac perspective. Her attitude remains very positive and she is dealing with stage IV non-small cell lung cancer remarkably well. I will see her back in 6 months.  Current medicines are reviewed with the patient today.  The patient does not have concerns regarding medicines.  Labs/ tests ordered today include:   Orders Placed This Encounter  Procedures  . EKG 12-Lead    Disposition:   FU 6 months  Signed, Sherren Mocha, MD  10/04/2016 5:29 PM      [facility] [address], [place], [place]  [address] Phone: [phone]; Fax: [phone]

## 2016-10-07 ENCOUNTER — Other Ambulatory Visit (HOSPITAL_BASED_OUTPATIENT_CLINIC_OR_DEPARTMENT_OTHER): Payer: Medicare Other

## 2016-10-07 ENCOUNTER — Ambulatory Visit (HOSPITAL_BASED_OUTPATIENT_CLINIC_OR_DEPARTMENT_OTHER): Payer: Medicare Other

## 2016-10-07 VITALS — BP 161/79 | HR 66 | Resp 16

## 2016-10-07 DIAGNOSIS — C7951 Secondary malignant neoplasm of bone: Secondary | ICD-10-CM

## 2016-10-07 DIAGNOSIS — I10 Essential (primary) hypertension: Secondary | ICD-10-CM

## 2016-10-07 DIAGNOSIS — Z5112 Encounter for antineoplastic immunotherapy: Secondary | ICD-10-CM

## 2016-10-07 LAB — CBC WITH DIFFERENTIAL/PLATELET
BASO%: 0.8 % (ref 0.0–2.0)
Basophils Absolute: 0 10*3/uL (ref 0.0–0.1)
EOS%: 3.1 % (ref 0.0–7.0)
Eosinophils Absolute: 0.1 10*3/uL (ref 0.0–0.5)
HEMATOCRIT: 33.6 % — AB (ref 34.8–46.6)
HGB: 10.8 g/dL — ABNORMAL LOW (ref 11.6–15.9)
LYMPH#: 0.8 10*3/uL — AB (ref 0.9–3.3)
LYMPH%: 17 % (ref 14.0–49.7)
MCH: 28.3 pg (ref 25.1–34.0)
MCHC: 32.1 g/dL (ref 31.5–36.0)
MCV: 88.3 fL (ref 79.5–101.0)
MONO#: 0.6 10*3/uL (ref 0.1–0.9)
MONO%: 12.8 % (ref 0.0–14.0)
NEUT%: 66.3 % (ref 38.4–76.8)
NEUTROS ABS: 3 10*3/uL (ref 1.5–6.5)
Platelets: 227 10*3/uL (ref 145–400)
RBC: 3.81 10*6/uL (ref 3.70–5.45)
RDW: 13.8 % (ref 11.2–14.5)
WBC: 4.5 10*3/uL (ref 3.9–10.3)

## 2016-10-07 LAB — COMPREHENSIVE METABOLIC PANEL
AST: 12 U/L (ref 5–34)
Albumin: 3 g/dL — ABNORMAL LOW (ref 3.5–5.0)
Alkaline Phosphatase: 118 U/L (ref 40–150)
Anion Gap: 10 mEq/L (ref 3–11)
BUN: 44.7 mg/dL — AB (ref 7.0–26.0)
CHLORIDE: 107 meq/L (ref 98–109)
CO2: 22 meq/L (ref 22–29)
CREATININE: 3.3 mg/dL — AB (ref 0.6–1.1)
Calcium: 8.6 mg/dL (ref 8.4–10.4)
EGFR: 13 mL/min/{1.73_m2} — ABNORMAL LOW (ref >90)
Glucose: 109 mg/dl (ref 70–140)
Potassium: 4.6 mEq/L (ref 3.5–5.1)
Sodium: 139 mEq/L (ref 136–145)
Total Bilirubin: 0.43 mg/dL (ref 0.20–1.20)
Total Protein: 6.8 g/dL (ref 6.4–8.3)

## 2016-10-07 LAB — TSH: TSH: 3.485 m(IU)/L (ref 0.308–3.960)

## 2016-10-07 MED ORDER — PEMBROLIZUMAB CHEMO INJECTION 100 MG/4ML
200.0000 mg | Freq: Once | INTRAVENOUS | Status: AC
  Administered 2016-10-07: 200 mg via INTRAVENOUS
  Filled 2016-10-07: qty 8

## 2016-10-07 MED ORDER — SODIUM CHLORIDE 0.9 % IV SOLN
Freq: Once | INTRAVENOUS | Status: AC
  Administered 2016-10-07: 15:00:00 via INTRAVENOUS

## 2016-10-07 MED ORDER — SODIUM CHLORIDE 0.9% FLUSH
10.0000 mL | INTRAVENOUS | Status: DC | PRN
  Filled 2016-10-07: qty 10

## 2016-10-07 MED ORDER — HEPARIN SOD (PORK) LOCK FLUSH 100 UNIT/ML IV SOLN
500.0000 [IU] | Freq: Once | INTRAVENOUS | Status: DC | PRN
  Filled 2016-10-07: qty 5

## 2016-10-07 NOTE — Patient Instructions (Signed)
[facility] Discharge Instructions for Patients Receiving Chemotherapy  Today you received the following chemotherapy agents pembrolizumab (Keytruda).  To help prevent nausea and vomiting after your treatment, we encourage you to take your nausea medication as directed by your doctor.   If you develop nausea and vomiting that is not controlled by your nausea medication, call the clinic.   BELOW ARE SYMPTOMS THAT SHOULD BE REPORTED IMMEDIATELY:  *FEVER GREATER THAN 100.5 F  *CHILLS WITH OR WITHOUT FEVER  NAUSEA AND VOMITING THAT IS NOT CONTROLLED WITH YOUR NAUSEA MEDICATION  *UNUSUAL SHORTNESS OF BREATH  *UNUSUAL BRUISING OR BLEEDING  TENDERNESS IN MOUTH AND THROAT WITH OR WITHOUT PRESENCE OF ULCERS  *URINARY PROBLEMS  *BOWEL PROBLEMS  UNUSUAL RASH Items with * indicate a potential emergency and should be followed up as soon as possible.  Feel free to call the clinic you have any questions or concerns. The clinic phone number is [phone].  Please show the CHEMO ALERT CARD at check-in to the Emergency Department and triage nurse.  Pembrolizumab injection What is this medicine? PEMBROLIZUMAB (pem broe liz ue mab) is a monoclonal antibody. It is used to treat melanoma, head and neck cancer, Hodgkin lymphoma, non-small cell lung cancer, urothelial cancer, stomach cancer, and cancers that have a certain genetic condition. This medicine may be used for other purposes; ask your health care provider or pharmacist if you have questions. COMMON BRAND NAME(S): Keytruda What should I tell my health care provider before I take this medicine? They need to know if you have any of these conditions: -diabetes -immune system problems -inflammatory bowel disease -liver disease -lung or breathing disease -lupus -organ transplant -an unusual or allergic reaction to pembrolizumab, other medicines, foods, dyes, or preservatives -pregnant or trying to get  pregnant -breast-feeding How should I use this medicine? This medicine is for infusion into a vein. It is given by a health care professional in a hospital or clinic setting. A special MedGuide will be given to you before each treatment. Be sure to read this information carefully each time. Talk to your pediatrician regarding the use of this medicine in children. While this drug may be prescribed for selected conditions, precautions do apply. Overdosage: If you think you have taken too much of this medicine contact a poison control center or emergency room at once. NOTE: This medicine is only for you. Do not share this medicine with others. What if I miss a dose? It is important not to miss your dose. Call your doctor or health care professional if you are unable to keep an appointment. What may interact with this medicine? Interactions have not been studied. Give your health care provider a list of all the medicines, herbs, non-prescription drugs, or dietary supplements you use. Also tell them if you smoke, drink alcohol, or use illegal drugs. Some items may interact with your medicine. This list may not describe all possible interactions. Give your health care provider a list of all the medicines, herbs, non-prescription drugs, or dietary supplements you use. Also tell them if you smoke, drink alcohol, or use illegal drugs. Some items may interact with your medicine. What should I watch for while using this medicine? Your condition will be monitored carefully while you are receiving this medicine. You may need blood work done while you are taking this medicine. Do not become pregnant while taking this medicine or for 4 months after stopping it. Women should inform their doctor if they wish to become pregnant or   think they might be pregnant. There is a potential for serious side effects to an unborn child. Talk to your health care professional or pharmacist for more information. Do not breast-feed  an infant while taking this medicine or for 4 months after the last dose. What side effects may I notice from receiving this medicine? Side effects that you should report to your doctor or health care professional as soon as possible: -allergic reactions like skin rash, itching or hives, swelling of the face, lips, or tongue -bloody or black, tarry -breathing problems -changes in vision -chest pain -chills -constipation -cough -dizziness or feeling faint or lightheaded -fast or irregular heartbeat -fever -flushing -hair loss -low blood counts - this medicine may decrease the number of white blood cells, red blood cells and platelets. You may be at increased risk for infections and bleeding. -muscle pain -muscle weakness -persistent headache -signs and symptoms of high blood sugar such as dizziness; dry mouth; dry skin; fruity breath; nausea; stomach pain; increased hunger or thirst; increased urination -signs and symptoms of kidney injury like trouble passing urine or change in the amount of urine -signs and symptoms of liver injury like dark urine, light-colored stools, loss of appetite, nausea, right upper belly pain, yellowing of the eyes or skin -stomach pain -sweating -weight loss Side effects that usually do not require medical attention (report to your doctor or health care professional if they continue or are bothersome): -decreased appetite -diarrhea -tiredness This list may not describe all possible side effects. Call your doctor for medical advice about side effects. You may report side effects to FDA at 1-800-FDA-1088. Where should I keep my medicine? This drug is given in a hospital or clinic and will not be stored at home. NOTE: This sheet is a summary. It may not cover all possible information. If you have questions about this medicine, talk to your doctor, pharmacist, or health care provider.  2018 Elsevier/Gold Standard (2016-02-27 12:29:36)    

## 2016-10-07 NOTE — Progress Notes (Unsigned)
Dr. Julien Nordmann okay to tx with Ctn 3.3 and BP 161/79.

## 2016-10-08 DIAGNOSIS — N183 Chronic kidney disease, stage 3 (moderate): Secondary | ICD-10-CM | POA: Diagnosis not present

## 2016-10-08 DIAGNOSIS — Z86718 Personal history of other venous thrombosis and embolism: Secondary | ICD-10-CM | POA: Diagnosis not present

## 2016-10-08 DIAGNOSIS — I2510 Atherosclerotic heart disease of native coronary artery without angina pectoris: Secondary | ICD-10-CM | POA: Diagnosis not present

## 2016-10-08 DIAGNOSIS — I5032 Chronic diastolic (congestive) heart failure: Secondary | ICD-10-CM | POA: Diagnosis not present

## 2016-10-08 DIAGNOSIS — G8311 Monoplegia of lower limb affecting right dominant side: Secondary | ICD-10-CM | POA: Diagnosis not present

## 2016-10-08 DIAGNOSIS — C3490 Malignant neoplasm of unspecified part of unspecified bronchus or lung: Secondary | ICD-10-CM | POA: Diagnosis not present

## 2016-10-08 DIAGNOSIS — C7951 Secondary malignant neoplasm of bone: Secondary | ICD-10-CM | POA: Diagnosis not present

## 2016-10-08 DIAGNOSIS — Z87440 Personal history of urinary (tract) infections: Secondary | ICD-10-CM | POA: Diagnosis not present

## 2016-10-08 DIAGNOSIS — I130 Hypertensive heart and chronic kidney disease with heart failure and stage 1 through stage 4 chronic kidney disease, or unspecified chronic kidney disease: Secondary | ICD-10-CM | POA: Diagnosis not present

## 2016-10-08 DIAGNOSIS — Z466 Encounter for fitting and adjustment of urinary device: Secondary | ICD-10-CM | POA: Diagnosis not present

## 2016-10-09 ENCOUNTER — Telehealth: Payer: Self-pay | Admitting: Medical Oncology

## 2016-10-09 DIAGNOSIS — I2510 Atherosclerotic heart disease of native coronary artery without angina pectoris: Secondary | ICD-10-CM | POA: Diagnosis not present

## 2016-10-09 DIAGNOSIS — Z87440 Personal history of urinary (tract) infections: Secondary | ICD-10-CM | POA: Diagnosis not present

## 2016-10-09 DIAGNOSIS — I5032 Chronic diastolic (congestive) heart failure: Secondary | ICD-10-CM | POA: Diagnosis not present

## 2016-10-09 DIAGNOSIS — C3490 Malignant neoplasm of unspecified part of unspecified bronchus or lung: Secondary | ICD-10-CM | POA: Diagnosis not present

## 2016-10-09 DIAGNOSIS — Z86718 Personal history of other venous thrombosis and embolism: Secondary | ICD-10-CM | POA: Diagnosis not present

## 2016-10-09 DIAGNOSIS — N183 Chronic kidney disease, stage 3 (moderate): Secondary | ICD-10-CM | POA: Diagnosis not present

## 2016-10-09 DIAGNOSIS — Z466 Encounter for fitting and adjustment of urinary device: Secondary | ICD-10-CM | POA: Diagnosis not present

## 2016-10-09 DIAGNOSIS — G8311 Monoplegia of lower limb affecting right dominant side: Secondary | ICD-10-CM | POA: Diagnosis not present

## 2016-10-09 DIAGNOSIS — C7951 Secondary malignant neoplasm of bone: Secondary | ICD-10-CM | POA: Diagnosis not present

## 2016-10-09 DIAGNOSIS — I130 Hypertensive heart and chronic kidney disease with heart failure and stage 1 through stage 4 chronic kidney disease, or unspecified chronic kidney disease: Secondary | ICD-10-CM | POA: Diagnosis not present

## 2016-10-09 NOTE — Telephone Encounter (Signed)
Phone line busy.

## 2016-10-11 DIAGNOSIS — C3490 Malignant neoplasm of unspecified part of unspecified bronchus or lung: Secondary | ICD-10-CM | POA: Diagnosis not present

## 2016-10-11 DIAGNOSIS — Z86718 Personal history of other venous thrombosis and embolism: Secondary | ICD-10-CM | POA: Diagnosis not present

## 2016-10-11 DIAGNOSIS — Z87440 Personal history of urinary (tract) infections: Secondary | ICD-10-CM | POA: Diagnosis not present

## 2016-10-11 DIAGNOSIS — Z466 Encounter for fitting and adjustment of urinary device: Secondary | ICD-10-CM | POA: Diagnosis not present

## 2016-10-11 DIAGNOSIS — I130 Hypertensive heart and chronic kidney disease with heart failure and stage 1 through stage 4 chronic kidney disease, or unspecified chronic kidney disease: Secondary | ICD-10-CM | POA: Diagnosis not present

## 2016-10-11 DIAGNOSIS — I2510 Atherosclerotic heart disease of native coronary artery without angina pectoris: Secondary | ICD-10-CM | POA: Diagnosis not present

## 2016-10-11 DIAGNOSIS — I5032 Chronic diastolic (congestive) heart failure: Secondary | ICD-10-CM | POA: Diagnosis not present

## 2016-10-11 DIAGNOSIS — G8311 Monoplegia of lower limb affecting right dominant side: Secondary | ICD-10-CM | POA: Diagnosis not present

## 2016-10-11 DIAGNOSIS — C7951 Secondary malignant neoplasm of bone: Secondary | ICD-10-CM | POA: Diagnosis not present

## 2016-10-11 DIAGNOSIS — N183 Chronic kidney disease, stage 3 (moderate): Secondary | ICD-10-CM | POA: Diagnosis not present

## 2016-10-14 DIAGNOSIS — C3490 Malignant neoplasm of unspecified part of unspecified bronchus or lung: Secondary | ICD-10-CM | POA: Diagnosis not present

## 2016-10-14 DIAGNOSIS — I5032 Chronic diastolic (congestive) heart failure: Secondary | ICD-10-CM | POA: Diagnosis not present

## 2016-10-14 DIAGNOSIS — C7951 Secondary malignant neoplasm of bone: Secondary | ICD-10-CM | POA: Diagnosis not present

## 2016-10-14 DIAGNOSIS — I2510 Atherosclerotic heart disease of native coronary artery without angina pectoris: Secondary | ICD-10-CM | POA: Diagnosis not present

## 2016-10-14 DIAGNOSIS — Z87440 Personal history of urinary (tract) infections: Secondary | ICD-10-CM | POA: Diagnosis not present

## 2016-10-14 DIAGNOSIS — Z466 Encounter for fitting and adjustment of urinary device: Secondary | ICD-10-CM | POA: Diagnosis not present

## 2016-10-14 DIAGNOSIS — I130 Hypertensive heart and chronic kidney disease with heart failure and stage 1 through stage 4 chronic kidney disease, or unspecified chronic kidney disease: Secondary | ICD-10-CM | POA: Diagnosis not present

## 2016-10-14 DIAGNOSIS — Z86718 Personal history of other venous thrombosis and embolism: Secondary | ICD-10-CM | POA: Diagnosis not present

## 2016-10-14 DIAGNOSIS — N183 Chronic kidney disease, stage 3 (moderate): Secondary | ICD-10-CM | POA: Diagnosis not present

## 2016-10-14 DIAGNOSIS — G8311 Monoplegia of lower limb affecting right dominant side: Secondary | ICD-10-CM | POA: Diagnosis not present

## 2016-10-15 DIAGNOSIS — C7951 Secondary malignant neoplasm of bone: Secondary | ICD-10-CM | POA: Diagnosis not present

## 2016-10-15 DIAGNOSIS — Z466 Encounter for fitting and adjustment of urinary device: Secondary | ICD-10-CM | POA: Diagnosis not present

## 2016-10-15 DIAGNOSIS — I2510 Atherosclerotic heart disease of native coronary artery without angina pectoris: Secondary | ICD-10-CM | POA: Diagnosis not present

## 2016-10-15 DIAGNOSIS — Z87440 Personal history of urinary (tract) infections: Secondary | ICD-10-CM | POA: Diagnosis not present

## 2016-10-15 DIAGNOSIS — C3490 Malignant neoplasm of unspecified part of unspecified bronchus or lung: Secondary | ICD-10-CM | POA: Diagnosis not present

## 2016-10-15 DIAGNOSIS — N183 Chronic kidney disease, stage 3 (moderate): Secondary | ICD-10-CM | POA: Diagnosis not present

## 2016-10-15 DIAGNOSIS — G8311 Monoplegia of lower limb affecting right dominant side: Secondary | ICD-10-CM | POA: Diagnosis not present

## 2016-10-15 DIAGNOSIS — I5032 Chronic diastolic (congestive) heart failure: Secondary | ICD-10-CM | POA: Diagnosis not present

## 2016-10-15 DIAGNOSIS — Z86718 Personal history of other venous thrombosis and embolism: Secondary | ICD-10-CM | POA: Diagnosis not present

## 2016-10-15 DIAGNOSIS — I130 Hypertensive heart and chronic kidney disease with heart failure and stage 1 through stage 4 chronic kidney disease, or unspecified chronic kidney disease: Secondary | ICD-10-CM | POA: Diagnosis not present

## 2016-10-17 DIAGNOSIS — I2510 Atherosclerotic heart disease of native coronary artery without angina pectoris: Secondary | ICD-10-CM | POA: Diagnosis not present

## 2016-10-17 DIAGNOSIS — I5032 Chronic diastolic (congestive) heart failure: Secondary | ICD-10-CM | POA: Diagnosis not present

## 2016-10-17 DIAGNOSIS — G8311 Monoplegia of lower limb affecting right dominant side: Secondary | ICD-10-CM | POA: Diagnosis not present

## 2016-10-17 DIAGNOSIS — Z87440 Personal history of urinary (tract) infections: Secondary | ICD-10-CM | POA: Diagnosis not present

## 2016-10-17 DIAGNOSIS — I130 Hypertensive heart and chronic kidney disease with heart failure and stage 1 through stage 4 chronic kidney disease, or unspecified chronic kidney disease: Secondary | ICD-10-CM | POA: Diagnosis not present

## 2016-10-17 DIAGNOSIS — C7951 Secondary malignant neoplasm of bone: Secondary | ICD-10-CM | POA: Diagnosis not present

## 2016-10-17 DIAGNOSIS — N183 Chronic kidney disease, stage 3 (moderate): Secondary | ICD-10-CM | POA: Diagnosis not present

## 2016-10-17 DIAGNOSIS — Z86718 Personal history of other venous thrombosis and embolism: Secondary | ICD-10-CM | POA: Diagnosis not present

## 2016-10-17 DIAGNOSIS — Z466 Encounter for fitting and adjustment of urinary device: Secondary | ICD-10-CM | POA: Diagnosis not present

## 2016-10-17 DIAGNOSIS — C3490 Malignant neoplasm of unspecified part of unspecified bronchus or lung: Secondary | ICD-10-CM | POA: Diagnosis not present

## 2016-10-18 DIAGNOSIS — C3490 Malignant neoplasm of unspecified part of unspecified bronchus or lung: Secondary | ICD-10-CM | POA: Diagnosis not present

## 2016-10-18 DIAGNOSIS — Z86718 Personal history of other venous thrombosis and embolism: Secondary | ICD-10-CM | POA: Diagnosis not present

## 2016-10-18 DIAGNOSIS — I130 Hypertensive heart and chronic kidney disease with heart failure and stage 1 through stage 4 chronic kidney disease, or unspecified chronic kidney disease: Secondary | ICD-10-CM | POA: Diagnosis not present

## 2016-10-18 DIAGNOSIS — C7951 Secondary malignant neoplasm of bone: Secondary | ICD-10-CM | POA: Diagnosis not present

## 2016-10-18 DIAGNOSIS — N183 Chronic kidney disease, stage 3 (moderate): Secondary | ICD-10-CM | POA: Diagnosis not present

## 2016-10-18 DIAGNOSIS — I5032 Chronic diastolic (congestive) heart failure: Secondary | ICD-10-CM | POA: Diagnosis not present

## 2016-10-18 DIAGNOSIS — Z87440 Personal history of urinary (tract) infections: Secondary | ICD-10-CM | POA: Diagnosis not present

## 2016-10-18 DIAGNOSIS — Z466 Encounter for fitting and adjustment of urinary device: Secondary | ICD-10-CM | POA: Diagnosis not present

## 2016-10-18 DIAGNOSIS — G8311 Monoplegia of lower limb affecting right dominant side: Secondary | ICD-10-CM | POA: Diagnosis not present

## 2016-10-18 DIAGNOSIS — I2510 Atherosclerotic heart disease of native coronary artery without angina pectoris: Secondary | ICD-10-CM | POA: Diagnosis not present

## 2016-10-21 DIAGNOSIS — Z87440 Personal history of urinary (tract) infections: Secondary | ICD-10-CM | POA: Diagnosis not present

## 2016-10-21 DIAGNOSIS — I2510 Atherosclerotic heart disease of native coronary artery without angina pectoris: Secondary | ICD-10-CM | POA: Diagnosis not present

## 2016-10-21 DIAGNOSIS — G8311 Monoplegia of lower limb affecting right dominant side: Secondary | ICD-10-CM | POA: Diagnosis not present

## 2016-10-21 DIAGNOSIS — C3490 Malignant neoplasm of unspecified part of unspecified bronchus or lung: Secondary | ICD-10-CM | POA: Diagnosis not present

## 2016-10-21 DIAGNOSIS — N183 Chronic kidney disease, stage 3 (moderate): Secondary | ICD-10-CM | POA: Diagnosis not present

## 2016-10-21 DIAGNOSIS — I5032 Chronic diastolic (congestive) heart failure: Secondary | ICD-10-CM | POA: Diagnosis not present

## 2016-10-21 DIAGNOSIS — C7951 Secondary malignant neoplasm of bone: Secondary | ICD-10-CM | POA: Diagnosis not present

## 2016-10-21 DIAGNOSIS — I130 Hypertensive heart and chronic kidney disease with heart failure and stage 1 through stage 4 chronic kidney disease, or unspecified chronic kidney disease: Secondary | ICD-10-CM | POA: Diagnosis not present

## 2016-10-21 DIAGNOSIS — Z466 Encounter for fitting and adjustment of urinary device: Secondary | ICD-10-CM | POA: Diagnosis not present

## 2016-10-21 DIAGNOSIS — Z86718 Personal history of other venous thrombosis and embolism: Secondary | ICD-10-CM | POA: Diagnosis not present

## 2016-10-22 DIAGNOSIS — I130 Hypertensive heart and chronic kidney disease with heart failure and stage 1 through stage 4 chronic kidney disease, or unspecified chronic kidney disease: Secondary | ICD-10-CM | POA: Diagnosis not present

## 2016-10-22 DIAGNOSIS — Z466 Encounter for fitting and adjustment of urinary device: Secondary | ICD-10-CM | POA: Diagnosis not present

## 2016-10-22 DIAGNOSIS — Z87440 Personal history of urinary (tract) infections: Secondary | ICD-10-CM | POA: Diagnosis not present

## 2016-10-22 DIAGNOSIS — C7951 Secondary malignant neoplasm of bone: Secondary | ICD-10-CM | POA: Diagnosis not present

## 2016-10-22 DIAGNOSIS — I2510 Atherosclerotic heart disease of native coronary artery without angina pectoris: Secondary | ICD-10-CM | POA: Diagnosis not present

## 2016-10-22 DIAGNOSIS — N183 Chronic kidney disease, stage 3 (moderate): Secondary | ICD-10-CM | POA: Diagnosis not present

## 2016-10-22 DIAGNOSIS — C3490 Malignant neoplasm of unspecified part of unspecified bronchus or lung: Secondary | ICD-10-CM | POA: Diagnosis not present

## 2016-10-22 DIAGNOSIS — Z86718 Personal history of other venous thrombosis and embolism: Secondary | ICD-10-CM | POA: Diagnosis not present

## 2016-10-22 DIAGNOSIS — G8311 Monoplegia of lower limb affecting right dominant side: Secondary | ICD-10-CM | POA: Diagnosis not present

## 2016-10-22 DIAGNOSIS — I5032 Chronic diastolic (congestive) heart failure: Secondary | ICD-10-CM | POA: Diagnosis not present

## 2016-10-24 DIAGNOSIS — G8311 Monoplegia of lower limb affecting right dominant side: Secondary | ICD-10-CM | POA: Diagnosis not present

## 2016-10-24 DIAGNOSIS — Z87440 Personal history of urinary (tract) infections: Secondary | ICD-10-CM | POA: Diagnosis not present

## 2016-10-24 DIAGNOSIS — I130 Hypertensive heart and chronic kidney disease with heart failure and stage 1 through stage 4 chronic kidney disease, or unspecified chronic kidney disease: Secondary | ICD-10-CM | POA: Diagnosis not present

## 2016-10-24 DIAGNOSIS — C3490 Malignant neoplasm of unspecified part of unspecified bronchus or lung: Secondary | ICD-10-CM | POA: Diagnosis not present

## 2016-10-24 DIAGNOSIS — C7951 Secondary malignant neoplasm of bone: Secondary | ICD-10-CM | POA: Diagnosis not present

## 2016-10-24 DIAGNOSIS — Z466 Encounter for fitting and adjustment of urinary device: Secondary | ICD-10-CM | POA: Diagnosis not present

## 2016-10-24 DIAGNOSIS — N183 Chronic kidney disease, stage 3 (moderate): Secondary | ICD-10-CM | POA: Diagnosis not present

## 2016-10-24 DIAGNOSIS — Z86718 Personal history of other venous thrombosis and embolism: Secondary | ICD-10-CM | POA: Diagnosis not present

## 2016-10-24 DIAGNOSIS — I5032 Chronic diastolic (congestive) heart failure: Secondary | ICD-10-CM | POA: Diagnosis not present

## 2016-10-24 DIAGNOSIS — I2510 Atherosclerotic heart disease of native coronary artery without angina pectoris: Secondary | ICD-10-CM | POA: Diagnosis not present

## 2016-10-25 ENCOUNTER — Telehealth: Payer: Self-pay

## 2016-10-25 DIAGNOSIS — Z86718 Personal history of other venous thrombosis and embolism: Secondary | ICD-10-CM | POA: Diagnosis not present

## 2016-10-25 DIAGNOSIS — I2510 Atherosclerotic heart disease of native coronary artery without angina pectoris: Secondary | ICD-10-CM | POA: Diagnosis not present

## 2016-10-25 DIAGNOSIS — I130 Hypertensive heart and chronic kidney disease with heart failure and stage 1 through stage 4 chronic kidney disease, or unspecified chronic kidney disease: Secondary | ICD-10-CM | POA: Diagnosis not present

## 2016-10-25 DIAGNOSIS — C7951 Secondary malignant neoplasm of bone: Secondary | ICD-10-CM | POA: Diagnosis not present

## 2016-10-25 DIAGNOSIS — I5032 Chronic diastolic (congestive) heart failure: Secondary | ICD-10-CM | POA: Diagnosis not present

## 2016-10-25 DIAGNOSIS — Z87440 Personal history of urinary (tract) infections: Secondary | ICD-10-CM | POA: Diagnosis not present

## 2016-10-25 DIAGNOSIS — G8311 Monoplegia of lower limb affecting right dominant side: Secondary | ICD-10-CM | POA: Diagnosis not present

## 2016-10-25 DIAGNOSIS — Z466 Encounter for fitting and adjustment of urinary device: Secondary | ICD-10-CM | POA: Diagnosis not present

## 2016-10-25 DIAGNOSIS — C3490 Malignant neoplasm of unspecified part of unspecified bronchus or lung: Secondary | ICD-10-CM | POA: Diagnosis not present

## 2016-10-25 DIAGNOSIS — N183 Chronic kidney disease, stage 3 (moderate): Secondary | ICD-10-CM | POA: Diagnosis not present

## 2016-10-25 NOTE — Telephone Encounter (Signed)
Pt had question about appt being at [facility] long. Her daughter helped her figure it out.

## 2016-10-29 ENCOUNTER — Ambulatory Visit (HOSPITAL_BASED_OUTPATIENT_CLINIC_OR_DEPARTMENT_OTHER): Payer: Medicare Other

## 2016-10-29 ENCOUNTER — Other Ambulatory Visit (HOSPITAL_BASED_OUTPATIENT_CLINIC_OR_DEPARTMENT_OTHER): Payer: Medicare Other

## 2016-10-29 ENCOUNTER — Encounter: Payer: Self-pay | Admitting: Oncology

## 2016-10-29 ENCOUNTER — Ambulatory Visit (HOSPITAL_BASED_OUTPATIENT_CLINIC_OR_DEPARTMENT_OTHER): Payer: Medicare Other | Admitting: Oncology

## 2016-10-29 ENCOUNTER — Telehealth: Payer: Self-pay | Admitting: Oncology

## 2016-10-29 VITALS — BP 155/76 | HR 65 | Temp 99.0°F | Resp 16

## 2016-10-29 DIAGNOSIS — C7951 Secondary malignant neoplasm of bone: Secondary | ICD-10-CM | POA: Diagnosis not present

## 2016-10-29 DIAGNOSIS — Z5112 Encounter for antineoplastic immunotherapy: Secondary | ICD-10-CM

## 2016-10-29 DIAGNOSIS — R410 Disorientation, unspecified: Secondary | ICD-10-CM | POA: Diagnosis not present

## 2016-10-29 DIAGNOSIS — Z85118 Personal history of other malignant neoplasm of bronchus and lung: Secondary | ICD-10-CM

## 2016-10-29 DIAGNOSIS — Z79899 Other long term (current) drug therapy: Secondary | ICD-10-CM | POA: Diagnosis not present

## 2016-10-29 LAB — CBC WITH DIFFERENTIAL/PLATELET
BASO%: 1.2 % (ref 0.0–2.0)
Basophils Absolute: 0.1 10*3/uL (ref 0.0–0.1)
EOS%: 4.1 % (ref 0.0–7.0)
Eosinophils Absolute: 0.2 10*3/uL (ref 0.0–0.5)
HEMATOCRIT: 32.5 % — AB (ref 34.8–46.6)
HGB: 10.7 g/dL — ABNORMAL LOW (ref 11.6–15.9)
LYMPH#: 0.6 10*3/uL — AB (ref 0.9–3.3)
LYMPH%: 12.8 % — ABNORMAL LOW (ref 14.0–49.7)
MCH: 28.8 pg (ref 25.1–34.0)
MCHC: 33 g/dL (ref 31.5–36.0)
MCV: 87.3 fL (ref 79.5–101.0)
MONO#: 0.7 10*3/uL (ref 0.1–0.9)
MONO%: 14.4 % — ABNORMAL HIGH (ref 0.0–14.0)
NEUT%: 67.5 % (ref 38.4–76.8)
NEUTROS ABS: 3.1 10*3/uL (ref 1.5–6.5)
PLATELETS: 206 10*3/uL (ref 145–400)
RBC: 3.73 10*6/uL (ref 3.70–5.45)
RDW: 13.8 % (ref 11.2–14.5)
WBC: 4.5 10*3/uL (ref 3.9–10.3)

## 2016-10-29 LAB — URINALYSIS, MICROSCOPIC - CHCC
BILIRUBIN (URINE): NEGATIVE
Glucose: NEGATIVE mg/dL
KETONES: NEGATIVE mg/dL
Nitrite: POSITIVE
Protein: 30 mg/dL
Specific Gravity, Urine: 1.01 (ref 1.003–1.035)
Urobilinogen, UR: 0.2 mg/dL (ref 0.2–1)
pH: 7 (ref 4.6–8.0)

## 2016-10-29 LAB — COMPREHENSIVE METABOLIC PANEL
ALT: 6 U/L (ref 0–55)
AST: 11 U/L (ref 5–34)
Albumin: 3.2 g/dL — ABNORMAL LOW (ref 3.5–5.0)
Alkaline Phosphatase: 130 U/L (ref 40–150)
Anion Gap: 13 mEq/L — ABNORMAL HIGH (ref 3–11)
BILIRUBIN TOTAL: 0.57 mg/dL (ref 0.20–1.20)
BUN: 64.2 mg/dL — AB (ref 7.0–26.0)
CALCIUM: 8.9 mg/dL (ref 8.4–10.4)
CHLORIDE: 111 meq/L — AB (ref 98–109)
CO2: 17 meq/L — AB (ref 22–29)
CREATININE: 3.8 mg/dL — AB (ref 0.6–1.1)
EGFR: 11 mL/min/{1.73_m2} — ABNORMAL LOW (ref >90)
Glucose: 112 mg/dl (ref 70–140)
Potassium: 4.8 mEq/L (ref 3.5–5.1)
Sodium: 141 mEq/L (ref 136–145)
TOTAL PROTEIN: 7.1 g/dL (ref 6.4–8.3)

## 2016-10-29 LAB — TSH: TSH: 4.358 m(IU)/L — ABNORMAL HIGH (ref 0.308–3.960)

## 2016-10-29 MED ORDER — DIAZEPAM 2 MG PO TABS: 2.0000 mg | ORAL_TABLET | ORAL | 0 refills | Status: DC

## 2016-10-29 MED ORDER — HEPARIN SOD (PORK) LOCK FLUSH 100 UNIT/ML IV SOLN
500.0000 [IU] | Freq: Once | INTRAVENOUS | Status: AC | PRN
  Administered 2016-10-29: 500 [IU]
  Filled 2016-10-29: qty 5

## 2016-10-29 MED ORDER — SODIUM CHLORIDE 0.9% FLUSH
10.0000 mL | INTRAVENOUS | Status: DC | PRN
Start: 2016-10-29 — End: 2016-10-29
  Administered 2016-10-29: 10 mL
  Filled 2016-10-29: qty 10

## 2016-10-29 MED ORDER — SODIUM CHLORIDE 0.9 % IV SOLN
200.0000 mg | Freq: Once | INTRAVENOUS | Status: AC
  Administered 2016-10-29: 200 mg via INTRAVENOUS
  Filled 2016-10-29: qty 8

## 2016-10-29 MED ORDER — SODIUM CHLORIDE 0.9 % IV SOLN
Freq: Once | INTRAVENOUS | Status: AC
  Administered 2016-10-29: 09:00:00 via INTRAVENOUS

## 2016-10-29 MED ORDER — FOSFOMYCIN TROMETHAMINE 3 G PO PACK: 3.0000 g | PACK | Freq: Once | ORAL | 0 refills | Status: AC

## 2016-10-29 NOTE — Patient Instructions (Signed)
[facility] Discharge Instructions for Patients Receiving Chemotherapy  Today you received the following chemotherapy agents pembrolizumab Beryle Flock).  To help prevent nausea and vomiting after your treatment, we encourage you to take your nausea medication as directed by your doctor.   If you develop nausea and vomiting that is not controlled by your nausea medication, call the clinic.   BELOW ARE SYMPTOMS THAT SHOULD BE REPORTED IMMEDIATELY:  *FEVER GREATER THAN 100.5 F  *CHILLS WITH OR WITHOUT FEVER  NAUSEA AND VOMITING THAT IS NOT CONTROLLED WITH YOUR NAUSEA MEDICATION  *UNUSUAL SHORTNESS OF BREATH  *UNUSUAL BRUISING OR BLEEDING  TENDERNESS IN MOUTH AND THROAT WITH OR WITHOUT PRESENCE OF ULCERS  *URINARY PROBLEMS  *BOWEL PROBLEMS  UNUSUAL RASH Items with * indicate a potential emergency and should be followed up as soon as possible.  Feel free to call the clinic you have any questions or concerns. The clinic phone number is [phone].  Please show the [place] at check-in to the Emergency Department and triage nurse.  Pembrolizumab injection What is this medicine? PEMBROLIZUMAB (pem broe liz ue mab) is a monoclonal antibody. It is used to treat melanoma, head and neck cancer, Hodgkin lymphoma, non-small cell lung cancer, urothelial cancer, stomach cancer, and cancers that have a certain genetic condition. This medicine may be used for other purposes; ask your health care provider or pharmacist if you have questions. COMMON BRAND NAME(S): Keytruda What should I tell my health care provider before I take this medicine? They need to know if you have any of these conditions: -diabetes -immune system problems -inflammatory bowel disease -liver disease -lung or breathing disease -lupus -organ transplant -an unusual or allergic reaction to pembrolizumab, other medicines, foods, dyes, or preservatives -pregnant or trying to get  pregnant -breast-feeding How should I use this medicine? This medicine is for infusion into a vein. It is given by a health care professional in a hospital or clinic setting. A special MedGuide will be given to you before each treatment. Be sure to read this information carefully each time. Talk to your pediatrician regarding the use of this medicine in children. While this drug may be prescribed for selected conditions, precautions do apply. Overdosage: If you think you have taken too much of this medicine contact a poison control center or emergency room at once. NOTE: This medicine is only for you. Do not share this medicine with others. What if I miss a dose? It is important not to miss your dose. Call your doctor or health care professional if you are unable to keep an appointment. What may interact with this medicine? Interactions have not been studied. Give your health care provider a list of all the medicines, herbs, non-prescription drugs, or dietary supplements you use. Also tell them if you smoke, drink alcohol, or use illegal drugs. Some items may interact with your medicine. This list may not describe all possible interactions. Give your health care provider a list of all the medicines, herbs, non-prescription drugs, or dietary supplements you use. Also tell them if you smoke, drink alcohol, or use illegal drugs. Some items may interact with your medicine. What should I watch for while using this medicine? Your condition will be monitored carefully while you are receiving this medicine. You may need blood work done while you are taking this medicine. Do not become pregnant while taking this medicine or for 4 months after stopping it. Women should inform their doctor if they wish to become pregnant or  think they might be pregnant. There is a potential for serious side effects to an unborn child. Talk to your health care professional or pharmacist for more information. Do not breast-feed  an infant while taking this medicine or for 4 months after the last dose. What side effects may I notice from receiving this medicine? Side effects that you should report to your doctor or health care professional as soon as possible: -allergic reactions like skin rash, itching or hives, swelling of the face, lips, or tongue -bloody or black, tarry -breathing problems -changes in vision -chest pain -chills -constipation -cough -dizziness or feeling faint or lightheaded -fast or irregular heartbeat -fever -flushing -hair loss -low blood counts - this medicine may decrease the number of white blood cells, red blood cells and platelets. You may be at increased risk for infections and bleeding. -muscle pain -muscle weakness -persistent headache -signs and symptoms of high blood sugar such as dizziness; dry mouth; dry skin; fruity breath; nausea; stomach pain; increased hunger or thirst; increased urination -signs and symptoms of kidney injury like trouble passing urine or change in the amount of urine -signs and symptoms of liver injury like dark urine, light-colored stools, loss of appetite, nausea, right upper belly pain, yellowing of the eyes or skin -stomach pain -sweating -weight loss Side effects that usually do not require medical attention (report to your doctor or health care professional if they continue or are bothersome): -decreased appetite -diarrhea -tiredness This list may not describe all possible side effects. Call your doctor for medical advice about side effects. You may report side effects to FDA at 1-800-FDA-1088. Where should I keep my medicine? This drug is given in a hospital or clinic and will not be stored at home. NOTE: This sheet is a summary. It may not cover all possible information. If you have questions about this medicine, talk to your doctor, pharmacist, or health care provider.  2018 Elsevier/Gold Standard (2016-02-27 12:29:36)

## 2016-10-29 NOTE — Telephone Encounter (Signed)
Call placed the patient's daughter with the urinalysis results. UA shows positive nitrite and a large amount of leukocyte esterase. She has too numerous to count white blood cells and a moderate amount of bacteria. A prescription for fosfomycin 3 g 1 dose was sent to her pharmacy. The patient will keep her appointment for the MRI of the brain scheduled tomorrow.

## 2016-10-29 NOTE — Progress Notes (Signed)
Patient brought to infusion room this morning.  Daughter states that when she arrived to pick her up patient was confused and crying.  Patient reports denies dizziness and states that she has not fallen.  Vital signs stable, patient is A&O X 2, with confusion of current year and hospital. No signs or symptoms of distress noted.  Port accessed and labs drawn. Cyril Mourning, NP aware and will assess patient in infusion.

## 2016-10-29 NOTE — Progress Notes (Signed)
No images are attached to the encounter. No scans are attached to the encounter. No scans are attached to the encounter. [facility] OFFICE VISIT PROGRESS NOTE  Celene Squibb, MD [address] [place] [address]  DIAGNOSIS: Stage IV non-small cell lung cancer diagnosed in March 2007, with disease recurrence in June 2016 with positive PDL 1 expression (50%).  PRIOR THERAPY: 1) status post 6 cycles of systemic chemotherapy with carboplatin and docetaxel. Last dose was given 01/16/2006. 2) palliative radiotherapy to the large right paraspinous mass with osseous invasion centered at L5 under the care of Dr. Valere Dross. 3) systemic chemotherapy with carboplatin for an AUC of 5 and paclitaxel 175 mg meter squared every 3 weeks with Neulasta support. First dose expected on 02/02/2015. Status post 2 cycles, last dose was given 02/23/2015 discontinued secondary to intolerance and mild disease progression. 4) Keytruda 200 mg IV every 3 weeks. First dose 04/14/2015. Status post 3 cycles. Treatment was discontinued after the patient had several hospitalizations with recurrent infections and back abscess but were unrelated to her treatment.  CURRENT THERAPY: The patient resume Keytruda 200 mg IV on 10/07/2016. The patient is here for cycle 2 today.  INTERVAL HISTORY: [name] 83 y.o. female returns for follow-up prior to immunotherapy. She received her first dose of Keytruda on 10/07/2016. The patient tells today that everything went well with her first cycle of chemotherapy. Her daughter is with her today in the infusion room and contradicts this. The patient and daughter did deny having any fevers, chills, chest pain, shortness of breath, cough, or hemoptysis. He denied nausea and vomiting. Patient's daughter expressed that her mother has been more confused since last night. She reports that she is usually very sharp, but today she is confused and is having difficulty finding her  words. She is happy 1 minute and then tearful next. The daughter denies that her mother taken any new medications or any over-the-counter medications which may have made her sedated. Denies falls. Denies changes in the urine quality from her catheter. They have not as any hematuria. The patient is here for evaluation prior to her second cycle of Keytruda today.  MEDICAL HISTORY: Past Medical History:  Diagnosis Date  . A-fib ([place])   . Chronic anemia   . Chronic diastolic (congestive) heart failure ([place])   . Chronic fatigue 04/04/2015  . Chronic fatigue 04/04/2015  . CKD (chronic kidney disease)   . Coronary artery disease    a.  LHC (06/04/05): LHC done in [place] with high grade RCA => s/p BMS to RCA;  b.  Nuclear (09/14/09): Lexiscan; Inf infarct with mild peri-infarct ishemia, EF 52%; Low Risk.  Marland Kitchen DVT (deep venous thrombosis) ([place])   . Foot drop, right   . GERD (gastroesophageal reflux disease)   . Goals of care, counseling/discussion 09/30/2016  . Hypercholesterolemia   . Hypertension   . Ischemic cardiomyopathy    a. Echo (07/26/13): Mild LVH, EF 35-40%, diff HK, inf AK, Gr 2 DD, Tr AI, mildly dilated Ao root, MAC, mild MR, mild LAE, mod reduced RVSF.  . Non-small cell carcinoma of lung ([facility])    Stage IV;spinal cancer; had chemo and radiation and immune therapy    ALLERGIES:  is allergic to amlodipine; ciprofloxacin; mirtazapine; statins; aspirin; cephalexin; hydralazine; iron; ambien [zolpidem tartrate]; lorazepam; sulfonamide derivatives; zolpidem; penicillins; shellfish allergy; and tape.  MEDICATIONS:  Current Outpatient Prescriptions  Medication Sig Dispense Refill  . acetaminophen (TYLENOL) 500 MG tablet Take 500  mg by mouth daily as needed for mild pain.    . diazepam (VALIUM) 2 MG tablet Take 1 tablet (2 mg total) by mouth See admin instructions. Take 1 tab 1 hour prior to MRI. May repeat X1 after 1 hour if not effective. 2 tablet 0  . diphenoxylate-atropine (LOMOTIL)  2.5-0.025 MG tablet Take by mouth 4 (four) times daily as needed for diarrhea or loose stools. Reported on 11/28/2015    . furosemide (LASIX) 40 MG tablet Take 40 mg by mouth daily as needed.    . loperamide (IMODIUM) 2 MG capsule Take 1 capsule (2 mg total) by mouth every 6 (six) hours as needed for diarrhea or loose stools. 30 capsule 0  . metoprolol tartrate (LOPRESSOR) 25 MG tablet Take 0.5 tablets (12.5 mg total) by mouth at bedtime. 30 tablet 0  . pantoprazole (PROTONIX) 40 MG tablet Take 40 mg by mouth at bedtime.    Marland Kitchen SANTYL ointment Apply 1 application topically at bedtime as needed (bed sores). For bed sores     No current facility-administered medications for this visit.    Facility-Administered Medications Ordered in Other Visits  Medication Dose Route Frequency Provider Last Rate Last Dose  . heparin lock flush 100 unit/mL  500 Units Intracatheter Once PRN Curt Bears, MD      . sodium chloride flush (NS) 0.9 % injection 10 mL  10 mL Intracatheter PRN Curt Bears, MD      . sodium chloride flush (NS) 0.9 % injection 10 mL  10 mL Intracatheter PRN Curt Bears, MD   10 mL at 10/29/16 1129    SURGICAL HISTORY:  Past Surgical History:  Procedure Laterality Date  . ABDOMINAL HYSTERECTOMY    . APPENDECTOMY    . CATARACT EXTRACTION Bilateral   . CHOLECYSTECTOMY  2011  . CYSTOSCOPY     with stent placement  . CYSTOSCOPY W/ URETERAL STENT PLACEMENT Right 12/12/2015   Procedure: CYSTOSCOPY WITH RETROGRADE PYELOGRAM WITH STENT EXCHANGE;  Surgeon: Franchot Gallo, MD;  Location: [facility];  Service: Urology;  Laterality: Right;  . ERCP  2011   with stone extraction, done same day as cholecystectomy  . HEMATOMA EVACUATION  December 2006   groin  . HEMORROIDECTOMY    . LAMINECTOMY    . PORTACATH PLACEMENT    . TONSILLECTOMY      REVIEW OF SYSTEMS:  Review of Systems  Constitutional: Positive for malaise/fatigue. Negative for chills, diaphoresis, fever and weight loss.   HENT: Negative.   Eyes: Negative for blurred vision, double vision, photophobia, pain, discharge and redness.  Respiratory: Negative for cough, hemoptysis, sputum production, shortness of breath and wheezing.   Cardiovascular: Negative for chest pain, palpitations, orthopnea, claudication, leg swelling and PND.  Gastrointestinal: Negative.   Genitourinary: Negative for dysuria, flank pain, frequency, hematuria and urgency.       Patient has a Foley catheter in place. Drains to a leg bag.  Musculoskeletal: Negative.  Negative for falls.  Skin: Negative.   Neurological: Positive for weakness. Negative for dizziness, tingling, tremors, sensory change, speech change, focal weakness, seizures, loss of consciousness and headaches.  Endo/Heme/Allergies: Negative.   Psychiatric/Behavioral: Positive for memory loss. Negative for depression, hallucinations, substance abuse and suicidal ideas. The patient is not nervous/anxious and does not have insomnia.        Patient's daughter reports that she is more confused. She was unable to remember where she was coming this morning it did not know the year.     PHYSICAL  EXAMINATION: Physical Exam  Constitutional:  Well-developed female female who is tearful at times.  HENT:  Head: Normocephalic and atraumatic.  Mouth/Throat: Oropharynx is clear and moist. No oropharyngeal exudate.  Eyes: Conjunctivae and EOM are normal. Pupils are equal, round, and reactive to light. Right eye exhibits no discharge. Left eye exhibits no discharge. No scleral icterus.  Neck: Normal range of motion. Neck supple. No tracheal deviation present. No thyromegaly present.  Cardiovascular: Normal rate, regular rhythm, normal heart sounds and intact distal pulses.   Pulmonary/Chest: Effort normal and breath sounds normal. No stridor. No respiratory distress. She has no wheezes. She has no rales. She exhibits no tenderness.  Abdominal: Soft. Bowel sounds are normal. She exhibits no  distension. There is no tenderness.  Musculoskeletal: Normal range of motion. She exhibits no edema.  Lymphadenopathy:    She has no cervical adenopathy.  Neurological: She is alert. No cranial nerve deficit. She exhibits normal muscle tone. Coordination normal.  Patient is oriented to self and place. The patient thought the year was 2008. She knows who the current president is.  Skin: Skin is warm and dry. No rash noted. No erythema. No pallor.  Psychiatric:  Mood is labile. Tearful at times.  Vitals reviewed.   ECOG PERFORMANCE STATUS: 2 - Symptomatic, <50% confined to bed  There were no vitals taken for this visit.  Temperature 99.0, pulse 65, respirations 16, blood pressure 155/76, O2 sat 98% on room air.  LABORATORY DATA: Lab Results  Component Value Date   WBC 4.5 10/29/2016   HGB 10.7 (L) 10/29/2016   HCT 32.5 (L) 10/29/2016   MCV 87.3 10/29/2016   PLT 206 10/29/2016      Chemistry      Component Value Date/Time   NA 141 10/29/2016 0823   K 4.8 10/29/2016 0823   CL 100 (L) 12/07/2015 0853   CL 104 06/19/2012 0958   CO2 17 (L) 10/29/2016 0823   BUN 64.2 (H) 10/29/2016 0823   CREATININE 3.8 (HH) 10/29/2016 0823      Component Value Date/Time   CALCIUM 8.9 10/29/2016 0823   ALKPHOS 130 10/29/2016 0823   AST 11 10/29/2016 0823   ALT 6 10/29/2016 0823   BILITOT 0.57 10/29/2016 0823       RADIOGRAPHIC STUDIES:  No results found.   ASSESSMENT/PLAN:  No problem-specific Assessment & Plan notes found for this encounter. This is a very pleasant 83 year old female with stage IV non-small cell lung cancer that was initially diagnosed in March 2007 status post systemic chemotherapy with carboplatin and docetaxel for 6 cycles followed by observation for 9 years. The patient developed disease progression with metastatic disease in the lower thoracic and lumbar spines. She was treated with palliative radiotherapy followed by 2 cycles of systemic chemotherapy with  carboplatin and paclitaxel discontinued secondary to intolerance. The patient also received 3 cycles of treatment with Keytruda and tolerated the treatment well, but she took a break from the treatment and has been on observation. She had a repeat CT of the chest abdomen and pelvis performed in April 2018. The scan showed mild increase of her disease. The patient was given the option of observation and close monitoring versus consideration of palliative treatment with Keytruda. The patient opted to resume treatment with [facility]. The patient is status post 1 cycle of the [facility] which she tolerated well overall. She is here for cycle #2 today. The patient is noted to be more confused with labile mood. Neuro exam is normal. We  will proceed with Keytruda as planned. A urinalysis and urine culture and sensitivity were obtained today. Results are pending. A stat MRI of the brain was ordered. The first availability to get the scan completed is tomorrow at 1 PM. The patient and her daughter were notified of this appointment. The scan was ordered without contrast due to her renal insufficiency.  The patient will keep her follow-up appointment with our office in 3 weeks for her lung cancer. We can contact the patient and her daughter the results of the MRI when they are available. Patient's daughter was instructed to bring the patient to the emergency room if she develops worsening confusion, facial asymmetry, weakness in any of her extremities, or any other concerning symptoms.  All questions were answered. The patient knows to call the clinic with any problems, questions or concerns. We can certainly see the patient much sooner if necessary.  The patient was seen and examined with Dr. Julien Nordmann.  Mikey Bussing, DNP, AGPCNP-BC, AOCNP 10/29/16  ADDENDUM: Hematology/Oncology Attending: I had a face to face encounter with the patient. I recommended her care plan. This is a very pleasant 83 years old white  female with a stage IV non-small cell lung cancer that was diagnosed in March 2007. She is currently on treatment with [place] every 3 weeks status post 1 cycle of the new regimen. She presented to the clinic today for evaluation before starting cycle #2. She was complaining more confusion and her daughter mentioned that her mother is acting abnormality. She has a history of urinary tract infection in the past. We will repeat her urinalysis and culture sensitivity. I will also order MRI of the brain to rule out any metastatic disease or other abnormalities in the brain. The patient will proceed with her treatment with [place] today as scheduled. We will see her back for follow-up visit in 3 weeks with the next cycle of her treatment sooner if there is any concerning abnormalities. She was advised to call immediately if she has any concerning symptoms in the interval.\  Disclaimer: This note was dictated with voice recognition software. Similar sounding words can inadvertently be transcribed and may be missed upon review. Eilleen Kempf., MD 10/31/16

## 2016-10-30 ENCOUNTER — Telehealth: Payer: Self-pay | Admitting: Medical Oncology

## 2016-10-30 ENCOUNTER — Ambulatory Visit (HOSPITAL_COMMUNITY)
Admission: RE | Admit: 2016-10-30 | Discharge: 2016-10-30 | Disposition: A | Discharge status: Home / Self Care | Payer: Medicare Other | Source: Ambulatory Visit | Attending: Oncology | Admitting: Oncology

## 2016-10-30 ENCOUNTER — Encounter (HOSPITAL_COMMUNITY): Payer: Self-pay | Admitting: Emergency Medicine

## 2016-10-30 ENCOUNTER — Inpatient Hospital Stay (HOSPITAL_COMMUNITY)
Admission: EM | Admit: 2016-10-30 | Discharge: 2016-11-04 | DRG: 065 | Disposition: A | Discharge status: Home Health | Payer: Medicare Other | Attending: Family Medicine | Admitting: Family Medicine

## 2016-10-30 DIAGNOSIS — Z882 Allergy status to sulfonamides status: Secondary | ICD-10-CM

## 2016-10-30 DIAGNOSIS — G319 Degenerative disease of nervous system, unspecified: Secondary | ICD-10-CM | POA: Insufficient documentation

## 2016-10-30 DIAGNOSIS — Z960 Presence of urogenital implants: Secondary | ICD-10-CM | POA: Diagnosis present

## 2016-10-30 DIAGNOSIS — D631 Anemia in chronic kidney disease: Secondary | ICD-10-CM | POA: Diagnosis present

## 2016-10-30 DIAGNOSIS — I639 Cerebral infarction, unspecified: Secondary | ICD-10-CM

## 2016-10-30 DIAGNOSIS — R9082 White matter disease, unspecified: Secondary | ICD-10-CM | POA: Insufficient documentation

## 2016-10-30 DIAGNOSIS — Z8673 Personal history of transient ischemic attack (TIA), and cerebral infarction without residual deficits: Secondary | ICD-10-CM | POA: Diagnosis not present

## 2016-10-30 DIAGNOSIS — R4701 Aphasia: Secondary | ICD-10-CM | POA: Diagnosis present

## 2016-10-30 DIAGNOSIS — I459 Conduction disorder, unspecified: Secondary | ICD-10-CM | POA: Diagnosis not present

## 2016-10-30 DIAGNOSIS — Z91048 Other nonmedicinal substance allergy status: Secondary | ICD-10-CM

## 2016-10-30 DIAGNOSIS — D6859 Other primary thrombophilia: Secondary | ICD-10-CM | POA: Diagnosis not present

## 2016-10-30 DIAGNOSIS — M21371 Foot drop, right foot: Secondary | ICD-10-CM | POA: Diagnosis not present

## 2016-10-30 DIAGNOSIS — Z91040 Latex allergy status: Secondary | ICD-10-CM

## 2016-10-30 DIAGNOSIS — C3490 Malignant neoplasm of unspecified part of unspecified bronchus or lung: Secondary | ICD-10-CM | POA: Diagnosis not present

## 2016-10-30 DIAGNOSIS — I2510 Atherosclerotic heart disease of native coronary artery without angina pectoris: Secondary | ICD-10-CM | POA: Diagnosis not present

## 2016-10-30 DIAGNOSIS — Z87891 Personal history of nicotine dependence: Secondary | ICD-10-CM

## 2016-10-30 DIAGNOSIS — I480 Paroxysmal atrial fibrillation: Secondary | ICD-10-CM | POA: Diagnosis not present

## 2016-10-30 DIAGNOSIS — C7951 Secondary malignant neoplasm of bone: Secondary | ICD-10-CM | POA: Diagnosis present

## 2016-10-30 DIAGNOSIS — Z881 Allergy status to other antibiotic agents status: Secondary | ICD-10-CM

## 2016-10-30 DIAGNOSIS — I130 Hypertensive heart and chronic kidney disease with heart failure and stage 1 through stage 4 chronic kidney disease, or unspecified chronic kidney disease: Secondary | ICD-10-CM | POA: Diagnosis not present

## 2016-10-30 DIAGNOSIS — I5032 Chronic diastolic (congestive) heart failure: Secondary | ICD-10-CM | POA: Diagnosis present

## 2016-10-30 DIAGNOSIS — I255 Ischemic cardiomyopathy: Secondary | ICD-10-CM | POA: Diagnosis present

## 2016-10-30 DIAGNOSIS — Z823 Family history of stroke: Secondary | ICD-10-CM

## 2016-10-30 DIAGNOSIS — Z955 Presence of coronary angioplasty implant and graft: Secondary | ICD-10-CM

## 2016-10-30 DIAGNOSIS — N184 Chronic kidney disease, stage 4 (severe): Secondary | ICD-10-CM | POA: Diagnosis not present

## 2016-10-30 DIAGNOSIS — K219 Gastro-esophageal reflux disease without esophagitis: Secondary | ICD-10-CM | POA: Diagnosis present

## 2016-10-30 DIAGNOSIS — Z515 Encounter for palliative care: Secondary | ICD-10-CM | POA: Diagnosis not present

## 2016-10-30 DIAGNOSIS — I63412 Cerebral infarction due to embolism of left middle cerebral artery: Principal | ICD-10-CM | POA: Diagnosis present

## 2016-10-30 DIAGNOSIS — N359 Urethral stricture, unspecified: Secondary | ICD-10-CM | POA: Diagnosis present

## 2016-10-30 DIAGNOSIS — N179 Acute kidney failure, unspecified: Secondary | ICD-10-CM | POA: Diagnosis not present

## 2016-10-30 DIAGNOSIS — Z8249 Family history of ischemic heart disease and other diseases of the circulatory system: Secondary | ICD-10-CM

## 2016-10-30 DIAGNOSIS — I5033 Acute on chronic diastolic (congestive) heart failure: Secondary | ICD-10-CM | POA: Diagnosis not present

## 2016-10-30 DIAGNOSIS — E785 Hyperlipidemia, unspecified: Secondary | ICD-10-CM | POA: Diagnosis not present

## 2016-10-30 DIAGNOSIS — N1330 Unspecified hydronephrosis: Secondary | ICD-10-CM | POA: Diagnosis present

## 2016-10-30 DIAGNOSIS — I4891 Unspecified atrial fibrillation: Secondary | ICD-10-CM | POA: Diagnosis present

## 2016-10-30 DIAGNOSIS — N390 Urinary tract infection, site not specified: Secondary | ICD-10-CM | POA: Diagnosis not present

## 2016-10-30 DIAGNOSIS — I5030 Unspecified diastolic (congestive) heart failure: Secondary | ICD-10-CM | POA: Diagnosis not present

## 2016-10-30 DIAGNOSIS — K5900 Constipation, unspecified: Secondary | ICD-10-CM | POA: Diagnosis present

## 2016-10-30 DIAGNOSIS — I350 Nonrheumatic aortic (valve) stenosis: Secondary | ICD-10-CM | POA: Diagnosis present

## 2016-10-30 DIAGNOSIS — E7800 Pure hypercholesterolemia, unspecified: Secondary | ICD-10-CM | POA: Diagnosis present

## 2016-10-30 DIAGNOSIS — R222 Localized swelling, mass and lump, trunk: Secondary | ICD-10-CM | POA: Diagnosis not present

## 2016-10-30 DIAGNOSIS — I6789 Other cerebrovascular disease: Secondary | ICD-10-CM | POA: Diagnosis present

## 2016-10-30 DIAGNOSIS — Z886 Allergy status to analgesic agent status: Secondary | ICD-10-CM | POA: Diagnosis not present

## 2016-10-30 DIAGNOSIS — R410 Disorientation, unspecified: Secondary | ICD-10-CM

## 2016-10-30 DIAGNOSIS — Z888 Allergy status to other drugs, medicaments and biological substances status: Secondary | ICD-10-CM

## 2016-10-30 DIAGNOSIS — Z86718 Personal history of other venous thrombosis and embolism: Secondary | ICD-10-CM | POA: Diagnosis not present

## 2016-10-30 DIAGNOSIS — Z923 Personal history of irradiation: Secondary | ICD-10-CM

## 2016-10-30 DIAGNOSIS — Z466 Encounter for fitting and adjustment of urinary device: Secondary | ICD-10-CM

## 2016-10-30 DIAGNOSIS — Z801 Family history of malignant neoplasm of trachea, bronchus and lung: Secondary | ICD-10-CM

## 2016-10-30 DIAGNOSIS — Z7189 Other specified counseling: Secondary | ICD-10-CM

## 2016-10-30 DIAGNOSIS — I82409 Acute embolism and thrombosis of unspecified deep veins of unspecified lower extremity: Secondary | ICD-10-CM | POA: Diagnosis not present

## 2016-10-30 DIAGNOSIS — Z91013 Allergy to seafood: Secondary | ICD-10-CM

## 2016-10-30 DIAGNOSIS — Z880 Allergy status to penicillin: Secondary | ICD-10-CM

## 2016-10-30 DIAGNOSIS — Z9049 Acquired absence of other specified parts of digestive tract: Secondary | ICD-10-CM

## 2016-10-30 DIAGNOSIS — Z90710 Acquired absence of both cervix and uterus: Secondary | ICD-10-CM

## 2016-10-30 DIAGNOSIS — L8990 Pressure ulcer of unspecified site, unspecified stage: Secondary | ICD-10-CM | POA: Diagnosis not present

## 2016-10-30 DIAGNOSIS — Z800 Family history of malignant neoplasm of digestive organs: Secondary | ICD-10-CM

## 2016-10-30 DIAGNOSIS — I63411 Cerebral infarction due to embolism of right middle cerebral artery: Secondary | ICD-10-CM | POA: Diagnosis not present

## 2016-10-30 DIAGNOSIS — R29701 NIHSS score 1: Secondary | ICD-10-CM | POA: Diagnosis not present

## 2016-10-30 LAB — I-STAT CHEM 8, ED
BUN: 63 mg/dL — ABNORMAL HIGH (ref 6–20)
Calcium, Ion: 1.14 mmol/L — ABNORMAL LOW (ref 1.15–1.40)
Chloride: 108 mmol/L (ref 101–111)
Creatinine, Ser: 3.9 mg/dL — ABNORMAL HIGH (ref 0.44–1.00)
Glucose, Bld: 90 mg/dL (ref 65–99)
HCT: 32 % — ABNORMAL LOW (ref 36.0–46.0)
HEMOGLOBIN: 10.9 g/dL — AB (ref 12.0–15.0)
POTASSIUM: 5 mmol/L (ref 3.5–5.1)
Sodium: 138 mmol/L (ref 135–145)
TCO2: 19 mmol/L (ref 0–100)

## 2016-10-30 LAB — PROTIME-INR
INR: 1.15
Prothrombin Time: 14.7 seconds (ref 11.4–15.2)

## 2016-10-30 LAB — COMPREHENSIVE METABOLIC PANEL
ALT: 8 U/L — AB (ref 14–54)
AST: 15 U/L (ref 15–41)
Albumin: 3.2 g/dL — ABNORMAL LOW (ref 3.5–5.0)
Alkaline Phosphatase: 118 U/L (ref 38–126)
Anion gap: 10 (ref 5–15)
BILIRUBIN TOTAL: 0.6 mg/dL (ref 0.3–1.2)
BUN: 58 mg/dL — ABNORMAL HIGH (ref 6–20)
CHLORIDE: 109 mmol/L (ref 101–111)
CO2: 17 mmol/L — ABNORMAL LOW (ref 22–32)
Calcium: 8.6 mg/dL — ABNORMAL LOW (ref 8.9–10.3)
Creatinine, Ser: 3.74 mg/dL — ABNORMAL HIGH (ref 0.44–1.00)
GFR, EST AFRICAN AMERICAN: 12 mL/min — AB (ref >60)
GFR, EST NON AFRICAN AMERICAN: 10 mL/min — AB (ref >60)
Glucose, Bld: 96 mg/dL (ref 65–99)
POTASSIUM: 4.9 mmol/L (ref 3.5–5.1)
Sodium: 136 mmol/L (ref 135–145)
TOTAL PROTEIN: 7.4 g/dL (ref 6.5–8.1)

## 2016-10-30 LAB — CBC
HEMATOCRIT: 34 % — AB (ref 36.0–46.0)
Hemoglobin: 10.5 g/dL — ABNORMAL LOW (ref 12.0–15.0)
MCH: 27.6 pg (ref 26.0–34.0)
MCHC: 30.9 g/dL (ref 30.0–36.0)
MCV: 89.2 fL (ref 78.0–100.0)
Platelets: 204 10*3/uL (ref 150–400)
RBC: 3.81 MIL/uL — ABNORMAL LOW (ref 3.87–5.11)
RDW: 14.2 % (ref 11.5–15.5)
WBC: 4.2 10*3/uL (ref 4.0–10.5)

## 2016-10-30 LAB — DIFFERENTIAL
BASOS PCT: 1 %
Basophils Absolute: 0 10*3/uL (ref 0.0–0.1)
EOS ABS: 0.2 10*3/uL (ref 0.0–0.7)
Eosinophils Relative: 5 %
LYMPHS ABS: 0.9 10*3/uL (ref 0.7–4.0)
Lymphocytes Relative: 20 %
MONO ABS: 0.6 10*3/uL (ref 0.1–1.0)
MONOS PCT: 13 %
Neutro Abs: 2.6 10*3/uL (ref 1.7–7.7)
Neutrophils Relative %: 61 %

## 2016-10-30 LAB — APTT: aPTT: 36 seconds (ref 24–36)

## 2016-10-30 LAB — I-STAT TROPONIN, ED: TROPONIN I, POC: 0.01 ng/mL (ref 0.00–0.08)

## 2016-10-30 MED ORDER — ACETAMINOPHEN 650 MG RE SUPP: 650.0000 mg | RECTAL | Status: DC | PRN

## 2016-10-30 MED ORDER — ACETAMINOPHEN 160 MG/5ML PO SOLN: 650.0000 mg | ORAL | Status: DC | PRN

## 2016-10-30 MED ORDER — SODIUM CHLORIDE 0.9 % IV SOLN
INTRAVENOUS | Status: DC
  Administered 2016-10-31 – 2016-11-01 (×2): via INTRAVENOUS

## 2016-10-30 MED ORDER — CLOPIDOGREL BISULFATE 75 MG PO TABS
75.0000 mg | ORAL_TABLET | Freq: Once | ORAL | Status: AC
  Administered 2016-10-30: 75 mg via ORAL
  Filled 2016-10-30: qty 1

## 2016-10-30 MED ORDER — SODIUM CHLORIDE 0.9% FLUSH
10.0000 mL | Freq: Two times a day (BID) | INTRAVENOUS | Status: DC
  Administered 2016-10-31 – 2016-11-02 (×4): 10 mL

## 2016-10-30 MED ORDER — PANTOPRAZOLE SODIUM 40 MG PO TBEC
40.0000 mg | DELAYED_RELEASE_TABLET | Freq: Every day | ORAL | Status: DC
  Administered 2016-10-31 – 2016-11-03 (×5): 40 mg via ORAL
  Filled 2016-10-30 (×5): qty 1

## 2016-10-30 MED ORDER — ACETAMINOPHEN 325 MG PO TABS: 650.0000 mg | ORAL_TABLET | ORAL | Status: DC | PRN

## 2016-10-30 MED ORDER — CLOPIDOGREL BISULFATE 75 MG PO TABS
75.0000 mg | ORAL_TABLET | Freq: Every day | ORAL | Status: DC
  Administered 2016-10-31 – 2016-11-03 (×4): 75 mg via ORAL
  Filled 2016-10-30 (×4): qty 1

## 2016-10-30 MED ORDER — SENNOSIDES-DOCUSATE SODIUM 8.6-50 MG PO TABS
1.0000 | ORAL_TABLET | Freq: Every evening | ORAL | Status: DC | PRN
  Administered 2016-11-01: 1 via ORAL
  Filled 2016-10-30: qty 1

## 2016-10-30 MED ORDER — STROKE: EARLY STAGES OF RECOVERY BOOK
Freq: Once | Status: DC
  Filled 2016-10-30: qty 1

## 2016-10-30 NOTE — ED Provider Notes (Signed)
[facility] DEPT Provider Note   CSN: [id number] Arrival date & time: 10/30/16  1402     History   Chief Complaint Chief Complaint  Patient presents with  . Cerebrovascular Accident    HPI [name] is a 83 y.o. female.  Patient with confusion and speech changes yesterday morning at 6 am. Martin Majestic to chemotherapy and sent for outpatient MRI to r/o stroke that was done today. Patient also found to have UTI and started on antibiotics today. Patient found to have multiple strokes on MRI and sent for evaluation.    The history is provided by the patient and a caregiver.  Neurologic Problem  This is a new problem. The current episode started yesterday. The problem has been resolved. Pertinent negatives include no chest pain, no abdominal pain, no headaches and no shortness of breath. Nothing aggravates the symptoms. Nothing relieves the symptoms. She has tried nothing for the symptoms. The treatment provided no relief.    Past Medical History:  Diagnosis Date  . A-fib ([place])   . Chronic anemia   . Chronic diastolic (congestive) heart failure ([place])   . Chronic fatigue 04/04/2015  . Chronic fatigue 04/04/2015  . CKD (chronic kidney disease)   . Coronary artery disease    a.  LHC (06/04/05): LHC done in [place] with high grade RCA => s/p BMS to RCA;  b.  Nuclear (09/14/09): Lexiscan; Inf infarct with mild peri-infarct ishemia, EF 52%; Low Risk.  Marland Kitchen DVT (deep venous thrombosis) ([place])   . Foot drop, right   . GERD (gastroesophageal reflux disease)   . Goals of care, counseling/discussion 09/30/2016  . Hypercholesterolemia   . Hypertension   . Ischemic cardiomyopathy    a. Echo (07/26/13): Mild LVH, EF 35-40%, diff HK, inf AK, Gr 2 DD, Tr AI, mildly dilated Ao root, MAC, mild MR, mild LAE, mod reduced RVSF.  . Non-small cell carcinoma of lung ([place])    Stage IV;spinal cancer; had chemo and radiation and immune therapy    Patient Active Problem List   Diagnosis Date Noted   . Encounter for antineoplastic immunotherapy 09/30/2016  . Goals of care, counseling/discussion 09/30/2016  . Port catheter in place 03/20/2016  . Abdominal pain   . Acute on chronic renal failure ([place])   . Acute on chronic respiratory failure ([place]) 07/25/2015  . SOB (shortness of breath) 07/25/2015  . Catheter-associated urinary tract infection ([place]) 07/20/2015  . Atrial fibrillation ([place]) 07/19/2015  . Acute on chronic diastolic (congestive) heart failure ([place]) 07/19/2015  . UTI (lower urinary tract infection) 07/17/2015  . Hyperkalemia 07/17/2015  . Dehydration 07/17/2015  . Right foot ulcer ([place])   . Abscess   . Right leg DVT ([place]) 06/24/2015  . Diarrhea 06/24/2015  . Edema   . Sepsis ([place])   . Malnutrition of moderate degree 06/18/2015  . Hypothermia 06/16/2015  . Paraspinal abscess ([place])   . Acute renal failure superimposed on stage 3 chronic kidney disease ([place]) 03/17/2015  . Anemia of chronic disease 03/17/2015  . Hypokalemia 03/17/2015  . Hypotension 02/23/2015  . Lung cancer stage IV with Metastatic squamous cell carcinoma to bone ([place]) 11/28/2014  . Pressure ulcer 11/24/2014    Past Surgical History:  Procedure Laterality Date  . ABDOMINAL HYSTERECTOMY    . APPENDECTOMY    . CATARACT EXTRACTION Bilateral   . CHOLECYSTECTOMY  2011  . CYSTOSCOPY     with stent placement  . CYSTOSCOPY W/ URETERAL STENT PLACEMENT Right 12/12/2015   Procedure: CYSTOSCOPY WITH  RETROGRADE PYELOGRAM WITH STENT EXCHANGE;  Surgeon: Franchot Gallo, MD;  Location: [facility];  Service: Urology;  Laterality: Right;  . ERCP  2011   with stone extraction, done same day as cholecystectomy  . HEMATOMA EVACUATION  December 2006   groin  . HEMORROIDECTOMY    . LAMINECTOMY    . PORTACATH PLACEMENT    . TONSILLECTOMY      OB History    No data available       Home Medications    Prior to Admission medications   Medication Sig Start Date End Date Taking? Authorizing Provider   acetaminophen (TYLENOL) 500 MG tablet Take 500 mg by mouth daily as needed for mild pain.    [provider]  diazepam (VALIUM) 2 MG tablet Take 1 tablet (2 mg total) by mouth See admin instructions. Take 1 tab 1 hour prior to MRI. May repeat X1 after 1 hour if not effective. 10/29/16   Maryanna Shape, NP  diphenoxylate-atropine (LOMOTIL) 2.5-0.025 MG tablet Take by mouth 4 (four) times daily as needed for diarrhea or loose stools. Reported on 11/28/2015    [provider]  furosemide (LASIX) 40 MG tablet Take 40 mg by mouth daily as needed. 03/15/16   [provider]  loperamide (IMODIUM) 2 MG capsule Take 1 capsule (2 mg total) by mouth every 6 (six) hours as needed for diarrhea or loose stools. 07/02/15   Robbie Lis, MD  metoprolol tartrate (LOPRESSOR) 25 MG tablet Take 0.5 tablets (12.5 mg total) by mouth at bedtime. 12/07/15   Isaiah Serge, NP  pantoprazole (PROTONIX) 40 MG tablet Take 40 mg by mouth at bedtime.    [provider]  SANTYL ointment Apply 1 application topically at bedtime as needed (bed sores). For bed sores 12/27/14   [provider]    Family History Family History  Problem Relation Age of Onset  . Heart failure Mother   . Lung cancer Sister   . Lung cancer Brother   . Stomach cancer Brother   . Heart attack Neg Hx   . Stroke Neg Hx   . Colon cancer Neg Hx     Social History Social History  Substance Use Topics  . Smoking status: Former Smoker    Packs/day: 1.00    Years: 35.00    Types: Cigarettes    Quit date: 12/07/1990  . Smokeless tobacco: Never Used  . Alcohol use No     Allergies   Amlodipine; Ciprofloxacin; Mirtazapine; Statins; Aspirin; Cephalexin; Hydralazine; Iron; Ambien [zolpidem tartrate]; Lorazepam; Sulfonamide derivatives; Zolpidem; Penicillins; Shellfish allergy; and Tape   Review of Systems Review of Systems  Constitutional: Negative for chills and fever.  HENT: Negative for ear pain  and sore throat.   Eyes: Negative for pain and visual disturbance.  Respiratory: Negative for cough and shortness of breath.   Cardiovascular: Negative for chest pain and palpitations.  Gastrointestinal: Negative for abdominal pain and vomiting.  Genitourinary: Negative for dysuria and hematuria.  Musculoskeletal: Negative for arthralgias and back pain.  Skin: Negative for color change and rash.  Neurological: Positive for speech difficulty. Negative for dizziness, tremors, seizures, syncope, facial asymmetry, weakness, light-headedness, numbness and headaches.  Psychiatric/Behavioral: Positive for confusion.  All other systems reviewed and are negative.    Physical Exam Updated Vital Signs  ED Triage Vitals [10/30/16 1407]  Enc Vitals Group     BP (!) 155/77     Pulse Rate 62     Resp 18  Temp 97.4 F (36.3 C)     Temp Source Oral     SpO2 98 %     Weight      Height      Head Circumference      Peak Flow      Pain Score 0     Pain Loc      Pain Edu?      Excl. in [place]?     Physical Exam  Constitutional: She is oriented to person, place, and time. She appears well-developed and well-nourished. No distress.  HENT:  Head: Normocephalic and atraumatic.  Eyes: Conjunctivae and EOM are normal. Pupils are equal, round, and reactive to light.  Neck: Normal range of motion. Neck supple.  Cardiovascular: Normal rate, regular rhythm, normal heart sounds and intact distal pulses.   No murmur heard. Pulmonary/Chest: Effort normal and breath sounds normal. No respiratory distress.  Abdominal: Soft. There is no tenderness.  Musculoskeletal: She exhibits no edema.  Neurological: She is alert and oriented to person, place, and time. No cranial nerve deficit or sensory deficit. She exhibits normal muscle tone. Coordination normal.  5+/5 strength, normal sensation, no drift, normal finger to nose finger, some word finding issue  Skin: Skin is warm and dry. Capillary refill takes less  than 2 seconds.  Psychiatric: She has a normal mood and affect.  Nursing note and vitals reviewed.    ED Treatments / Results  Labs (all labs ordered are listed, but only abnormal results are displayed) Labs Reviewed  CBC - Abnormal; Notable for the following:       Result Value   RBC 3.81 (*)    Hemoglobin 10.5 (*)    HCT 34.0 (*)    All other components within normal limits  COMPREHENSIVE METABOLIC PANEL - Abnormal; Notable for the following:    CO2 17 (*)    BUN 58 (*)    Creatinine, Ser 3.74 (*)    Calcium 8.6 (*)    Albumin 3.2 (*)    ALT 8 (*)    GFR calc non Af Amer 10 (*)    GFR calc Af Amer 12 (*)    All other components within normal limits  I-STAT CHEM 8, ED - Abnormal; Notable for the following:    BUN 63 (*)    Creatinine, Ser 3.90 (*)    Calcium, Ion 1.14 (*)    Hemoglobin 10.9 (*)    HCT 32.0 (*)    All other components within normal limits  PROTIME-INR  APTT  DIFFERENTIAL  I-STAT TROPOININ, ED  CBG MONITORING, ED    EKG  EKG Interpretation  Date/Time:  Wednesday Oct 30 2016 14:21:48 EDT Ventricular Rate:  62 PR Interval:  262 QRS Duration: 90 QT Interval:  456 QTC Calculation: 462 R Axis:   49 Text Interpretation:  Sinus rhythm with 1st degree A-V block ST & T wave abnormality, consider inferolateral ischemia Abnormal ECG Confirmed by Tanna Furry [phone]) on 10/30/2016 5:54:57 PM       Radiology Mr Brain Wo Contrast  Result Date: 10/30/2016 CLINICAL DATA:  Non-small cell lung cancer. Stage IV Squamous cell carcinoma with metastatic disease to the bone. Confusion. Cough EXAM: MRI HEAD WITHOUT CONTRAST TECHNIQUE: Multiplanar, multiecho pulse sequences of the brain and surrounding structures were obtained without intravenous contrast. COMPARISON:  MRI the brain 11/28/2014 FINDINGS: Brain: Diffusion-weighted images demonstrated a prominent left frontal acute/subacute nonhemorrhagic infarct. A smaller area of acute/subacute nonhemorrhagic infarction  is present in the left parietal  lobe. Advanced white matter disease is present with multiple remote lacunar infarcts noted in the basal ganglia bilaterally. Dilated perivascular spaces are also present in the basal ganglia. White matter changes extend into the brainstem. Remote lacunar infarcts are present within the cerebellum bilaterally. Although contrast was not given, no definite mass lesion is present to suggest metastatic disease. Arachnoid cysts over the convexity are stable bilaterally. Vascular: Flow is present in the major intracranial arteries. Skull and upper cervical spine: Skullbase is within normal limits. No discrete osseous lesions are evident. The craniocervical junction is within normal limits. Sinuses/Orbits: The paranasal sinuses are clear. Bilateral lens replacements are noted. IMPRESSION: 1. Acute/subacute cortical and subcortical infarct involving the anterior left frontal lobe along the middle frontal gyrus. 2. Acute/subacute cortical infarct involving the left parietal lobe, potentially along the watershed territory. 3. Advanced atrophy and diffuse white matter disease compatible with chronic microvascular ischemic changes. 4. Remote lacunar infarcts involving the basal ganglia bilaterally. 5. Diffuse white matter disease into the brainstem with remote lacunar infarcts in the cerebellum bilaterally, right greater than left. These results were called by telephone at the time of interpretation on 10/30/2016 at 1:40 pm to Dr. Curt Bears, who verbally acknowledged these results. At the request of Dr. Julien Nordmann, the patient was directed to the [facility] Emergency Department for evaluation of the acute infarct and admission if appropriate. Electronically Signed   By: San Morelle M.D.   On: 10/30/2016 13:43    Procedures Procedures (including critical care time)  Medications Ordered in ED Medications  clopidogrel (PLAVIX) tablet 75 mg (not administered)     Initial  Impression / Assessment and Plan / ED Course  I have reviewed the triage vital signs and the nursing notes.  Pertinent labs & imaging results that were available during my care of the patient were reviewed by me and considered in my medical decision making (see chart for details).     [name] is an 83 year old female with history of lung cancer currently on chemotherapy who presents to the ED with stroke. Patient's vitals at time of arrival to the ED are unremarkable and patient is without fever. Patient with speech difficulties that started yesterday at 6 AM. Patient went to chemotherapy yesterday morning and ordered labs, urinalysis, MRI. Patient found to have a urinary tract infection and started on antibiotics. Patient was unable to get MRI yesterday and had it performed today as an outpatient and showed multiple left hemispheric strokes. Patient with continued inconsistent aphasia but otherwise neurologically intact. Patient denies chest pain, shortness of breath. Overall patient is well-appearing. Patient is allergic to aspirin and will give Plavix. Patient has history of atrial fibrillation but not seen on any prior EKGs. Patient is not currently on any anticoagulation. Labs collected and EKG.  EKG shows normal sinus rhythm with T-wave inversions in the lateral leads which are seen on prior EKGs. There is no significant new findings on EKG. Troponin within normal limits and patient without chest pain and doubt ACS. Patient with elevation of creatinine but at baseline. Patient otherwise with no significant anemia or electrolyte abnormalities. Patient with overall unremarkable labs. Neurology was consulted an made aware. Patient to be admitted to medicine service for further workup. Patient transferred in hemodynamically stable condition.  Final Clinical Impressions(s) / ED Diagnoses   Final diagnoses:  Acute CVA (cerebrovascular accident) [facility])    New Prescriptions New Prescriptions    No medications on file     Lennice Sites, DO 10/30/16  [place], [place], DO 10/30/16 Virl Cagey    Tanna Furry, MD 10/31/16 [phone]

## 2016-10-30 NOTE — Telephone Encounter (Signed)
Faxed face to face encounter visit to home health

## 2016-10-30 NOTE — ED Provider Notes (Signed)
Pt seen and evaluated. D/W Dr. Ronnald Nian. Patient on exam with inconsistent aphasia. MRI with Multiple Lt hemispheric CVAs. Allergic to aspirin. Will give plavix. D/W Unassigned medicine for admit.   Tanna Furry, MD 10/30/16 [phone]

## 2016-10-30 NOTE — ED Notes (Signed)
ED Provider at bedside. 

## 2016-10-30 NOTE — ED Notes (Signed)
Family reports pt with confusion and "babbling" sudden onset yesterday morning. Pt family states pt still somewhat confused. Pt had MRI earlier today which indicated pt had a stroke so pt instructed to come here for eval. Pt confused over R/L but able to follow commands. Pt with not other deficits noted at this time.

## 2016-10-30 NOTE — H&P (Signed)
[facility] Admission History and Physical Service Pager: [phone]  Patient name: [name] Medical record number: [id number] Date of birth: 03-06-1934 Age: 83 y.o. Gender: female  Primary Care Provider: Celene Squibb, MD Consultants: Neurology   Code Status: FULL (discussed on admission)   Chief Complaint:  aphasia  Assessment and Plan: [name] is a 83 y.o. female presenting with stroke. PMH is significant for history of stage IV lung cancer currently on chemotherapy, atrial fibrillation not on A/C, h/o multiple DVTs,  HLD, CKD III, and CHF.     Aphasia 2/2 multiple left hemispheric strokes:  VSS upon arrival to ED and patient was afebrile without white count.  She presents with her daughter and son in law at bedside. Symptoms initially began yesterday around 6AM. Noted to have speech difficulties at her session for chemotherapy and labs ordered for workup. On labs noted to have a UTI and was given a one-time dose of antibiotics. Outpatient MRI at [facility] was ordered showing multiple strokes; cortical and subcortical infarct involving the anterior left frontal lobe along the middle frontal gyrus, cortical infarct involving the left parietal lobe, and potentially along the watershed territory. Was subsequently urged to go to the ED. Neuro exam shows continued aphasia and some difficulty finding words as well as right foot drop (chronic) and otherwise unremarkable. Patient is allergic to ASA and was started on Plavix 75 mg in ED. EKG with NSR with t-wave inversions in lateral leads, stable from prior EKGs. Troponin negative and ACS is unlikely.  SCr of 3.74 but otherwise unremarkable. Neurology was consulted in the ED and she was admitted to [facility] for further stroke workup.  -Admit to telemetry, attending Dr. Gwendlyn Deutscher  -Neurology consulted in ED, appreciate recs  -Risk start labs ordered : TSH, A1c, lipid panel  -Trend trops  -ECHO  -AM EKG  -SCD's for DVT  prophylaxis  -Carotid dopplers  -LE dopplers  -Will allow permissive HTN  -continue Plavix 75 mg daily  -Neuro checks Q2h x12 h then Q4 hours  -Fall precautions  -SLP/PT/OT recs  -Vitals per unit routine  H/o Stage IV lung CA with mets to bone. Currently on chemotherapy. Follows outpatient with Oncology. Per daughter has had poor po intake while on chemotherapy but is otherwise well. -Continue to monitor   Hx of CAD: Hx of CAD s/p stenting in 2007.   - Holding Metoprolol for now for permissive htn  HFpEF: Last Echo 06/2015 with EF 50-55% G1DD with enhanced echogenicity, thinning and akinesis of the inferior wall consistent with prior infarct.  On exam no evidence of fluid overload and lung exam without crackles noted.  She denies shortness of breath on exertion.  At home on 40 mg Lasix as needed for edema and Lopressor.  -Will hold Lasix and Lopressor for now  -Echo as above  -I's and O's  -daily weights   Hx of Afib: In NSR on exam. Not currently on anticoagulation. - Holding metoprolol as above  H/o multiple DVT's. Stable.  Patient with no calf tenderness or warmth noted on exam.   -Vas US dopplers ordered -SCD's for DVT prophylaxis in setting of stroke   ?Acute on CKD Stage IV.  SCr of 3.74.  BL appears ~3.1-3.6.  Follows outpatient with Nephrology. No uremia. Electrolytes stable.  -Daily BMET  - Avoid nephrotoxic agents - Consider inpatient nephro referral if Cr trends upwards  Constipation.  Last bowel movement was on Saturday.   -Will add bowel  regimen   Abnormal UA: Moderate bacteria, large blood, large LE, and + Nitrites on UA yesterday. Patient afebrile and asymptomatic. Has chronic foley in. S/p 1 dose of antibiotic (unsure which one this was) - Will hold off on abx at this time as patient is afebrile and asymptomatic and we have a reason for her change in mental status. Additionally, has a chronic foley in.  Chronic Foley catheter: Patient has had foley in for 1  year. Unclear why. Is changed on the 1st of every month.  - Will order for foley to be changed  FEN/GI: NPO pending SLP eval, IVF @ [facility] Prophylaxis: SCD's Protonix   Disposition: Admit to FPTS, Telemetry- attending Dr. Gwendlyn Deutscher   History of Present Illness:  [name] is a 83 y.o. female with PMH of presenting with trouble speaking.   Patient unable to provide much history, she asked her daughter to provide the history.  Patient's daughter notes that she was very confused yesterday and was "babbling" while at her chemotherapy center.  Daughter notes that at baseline she has a good memory and is very sharp so she knew something was wrong. Patient then went to her oncologist yesterday and they knew something was wrong. They did some bloodwork and tested her urine, there was some signs of UTI and they started an antibiotic (got 1 dose yesterday).  At baseline she has a catheter in since Jan of 2017, the last time they changed it was the first of May (it gets changed on the first of every month). They ordered an outpatient MRI today at [facility]. She was then found to have multiple strokes. Daughter notes that her speech has gotten a little better since she initially came to the ED.  PO intake has been down today but daughter notes that is not abnormal for her. She does follow with nephrology.  Does not currently smoke  cigarettes but did in the past. No drug or alcohol use.   Patient lives alone. Her daughter lives down the street and checks on her daily. At baseline patient ambulates with a walker without difficulty.   Denies any recent fevers, chills, nausea, vomiting, diarrhea.  Admits to some constipation.   Review Of Systems: Per HPI with the following additions: see HPI  Review of Systems  Constitutional: Negative for chills and fever.  HENT: Negative for congestion and sore throat.   Respiratory: Negative for sputum production, shortness of breath and wheezing.   Cardiovascular:  Negative for chest pain, palpitations, claudication and leg swelling.  Gastrointestinal: Positive for constipation. Negative for abdominal pain, diarrhea, nausea and vomiting.  Genitourinary: Negative for dysuria and urgency.  Skin: Negative for rash.  Neurological: Negative for weakness.   Patient Active Problem List   Diagnosis Date Noted  . Stroke (cerebrum) ([place]) 10/30/2016  . Encounter for antineoplastic immunotherapy 09/30/2016  . Goals of care, counseling/discussion 09/30/2016  . Port catheter in place 03/20/2016  . Abdominal pain   . Acute on chronic renal failure ([place])   . Acute on chronic respiratory failure ([place]) 07/25/2015  . SOB (shortness of breath) 07/25/2015  . Catheter-associated urinary tract infection ([place]) 07/20/2015  . Atrial fibrillation ([place]) 07/19/2015  . Acute on chronic diastolic (congestive) heart failure ([place]) 07/19/2015  . UTI (lower urinary tract infection) 07/17/2015  . Hyperkalemia 07/17/2015  . Dehydration 07/17/2015  . Right foot ulcer ([place])   . Abscess   . Right leg DVT ([place]) 06/24/2015  . Diarrhea 06/24/2015  . Edema   .  Sepsis ([place])   . Malnutrition of moderate degree 06/18/2015  . Hypothermia 06/16/2015  . Paraspinal abscess ([place])   . Acute renal failure superimposed on stage 3 chronic kidney disease ([place]) 03/17/2015  . Anemia of chronic disease 03/17/2015  . Hypokalemia 03/17/2015  . Hypotension 02/23/2015  . Lung cancer stage IV with Metastatic squamous cell carcinoma to bone ([place]) 11/28/2014  . Pressure ulcer 11/24/2014   Past Medical History: Past Medical History:  Diagnosis Date  . A-fib ([place])   . Chronic anemia   . Chronic diastolic (congestive) heart failure ([place])   . Chronic fatigue 04/04/2015  . Chronic fatigue 04/04/2015  . CKD (chronic kidney disease)   . Coronary artery disease    a.  LHC (06/04/05): LHC done in [place] with high grade RCA => s/p BMS to RCA;  b.  Nuclear (09/14/09): Lexiscan; Inf infarct with mild  peri-infarct ishemia, EF 52%; Low Risk.  Marland Kitchen DVT (deep venous thrombosis) ([place])   . Foot drop, right   . GERD (gastroesophageal reflux disease)   . Goals of care, counseling/discussion 09/30/2016  . Hypercholesterolemia   . Hypertension   . Ischemic cardiomyopathy    a. Echo (07/26/13): Mild LVH, EF 35-40%, diff HK, inf AK, Gr 2 DD, Tr AI, mildly dilated Ao root, MAC, mild MR, mild LAE, mod reduced RVSF.  . Non-small cell carcinoma of lung ([place])    Stage IV;spinal cancer; had chemo and radiation and immune therapy   Past Surgical History: Past Surgical History:  Procedure Laterality Date  . ABDOMINAL HYSTERECTOMY    . APPENDECTOMY    . CATARACT EXTRACTION Bilateral   . CHOLECYSTECTOMY  2011  . CYSTOSCOPY     with stent placement  . CYSTOSCOPY W/ URETERAL STENT PLACEMENT Right 12/12/2015   Procedure: CYSTOSCOPY WITH RETROGRADE PYELOGRAM WITH STENT EXCHANGE;  Surgeon: Franchot Gallo, MD;  Location: [facility];  Service: Urology;  Laterality: Right;  . ERCP  2011   with stone extraction, done same day as cholecystectomy  . HEMATOMA EVACUATION  December 2006   groin  . HEMORROIDECTOMY    . LAMINECTOMY    . PORTACATH PLACEMENT    . TONSILLECTOMY     Social History: Social History  Substance Use Topics  . Smoking status: Former Smoker    Packs/day: 1.00    Years: 35.00    Types: Cigarettes    Quit date: 12/07/1990  . Smokeless tobacco: Never Used  . Alcohol use No   Additional social history: Denies current tobacco use - she is a former smoker.  No drug or etoh use.  Please also refer to relevant sections of EMR.  Family History: Family History  Problem Relation Age of Onset  . Heart failure Mother   . Lung cancer Sister   . Lung cancer Brother   . Stomach cancer Brother   . Heart attack Neg Hx   . Stroke Neg Hx   . Colon cancer Neg Hx    Allergies and Medications: Allergies  Allergen Reactions  . Amlodipine Swelling  . Ciprofloxacin Hives and Other (See Comments)     REACTION: weakness  . Mirtazapine Other (See Comments)    Hallucinations and nightmares, verbally aggressive   . Statins Other (See Comments)    Unknown to patient  . Aspirin Swelling    Mouth swelling & tongue Patient reports that she tolerates ibuprofen without a problem  . Cephalexin Hives  . Hydralazine Nausea And Vomiting  . Iron Nausea And Vomiting  . Ambien [  Zolpidem Tartrate] Other (See Comments)    confusion  . Lorazepam Other (See Comments)    Hallucinations, verbally aggressive  . Sulfonamide Derivatives Hives  . Zolpidem     Confusion  . Latex Rash  . Penicillins Other (See Comments)    From childhood: Has patient had a PCN reaction causing immediate rash, facial/tongue/throat swelling, SOB or lightheadedness with hypotension: Yes Has patient had a PCN reaction causing severe rash involving mucus membranes or skin necrosis: Yes Has patient had a PCN reaction that required hospitalization No Has patient had a PCN reaction occurring within the last 10 years: No If all of the above answers are "NO", then may proceed with Cephalosporin use.   . Shellfish Allergy Hives and Rash  . Tape Rash    MUST USE PAPER TAPE!!!! SKIN IS VERY THIN AND WILL TEAR EASILY!!   Current Facility-Administered Medications on File Prior to Encounter  Medication Dose Route Frequency Provider Last Rate Last Dose  . heparin lock flush 100 unit/mL  500 Units Intracatheter Once PRN Curt Bears, MD      . sodium chloride flush (NS) 0.9 % injection 10 mL  10 mL Intracatheter PRN Curt Bears, MD       Current Outpatient Prescriptions on File Prior to Encounter  Medication Sig Dispense Refill  . acetaminophen (TYLENOL) 500 MG tablet Take 500 mg by mouth daily as needed for mild pain.    . diphenoxylate-atropine (LOMOTIL) 2.5-0.025 MG tablet Take by mouth 4 (four) times daily as needed for diarrhea or loose stools. Reported on 11/28/2015    . furosemide (LASIX) 40 MG tablet Take 40 mg by mouth  daily as needed for fluid or edema.     Marland Kitchen loperamide (IMODIUM) 2 MG capsule Take 1 capsule (2 mg total) by mouth every 6 (six) hours as needed for diarrhea or loose stools. 30 capsule 0  . metoprolol tartrate (LOPRESSOR) 25 MG tablet Take 0.5 tablets (12.5 mg total) by mouth at bedtime. (Patient taking differently: Take 25 mg by mouth See admin instructions. 25 mg in the morning and may take an additional 25 mg at bedtime if needed/as directed) 30 tablet 0  . pantoprazole (PROTONIX) 40 MG tablet Take 40 mg by mouth daily before breakfast.     . diazepam (VALIUM) 2 MG tablet Take 1 tablet (2 mg total) by mouth See admin instructions. Take 1 tab 1 hour prior to MRI. May repeat X1 after 1 hour if not effective. 2 tablet 0   Objective: BP (!) 165/77   Pulse 71   Temp 98.3 F (36.8 C)   Resp 16   SpO2 97%  Exam: General: pleasant 83 yo F, NAD, daughter and son-in-law at bedside Eyes: EOMI, PERRL ENTM: MMM, o/p clear  Neck: supple, no JVD Cardiovascular: RRR no MRG, palpable pulses  Respiratory: CTAB, no increased work of breathing  Gastrointestinal: soft, NTND, +bs  MSK: no edema or tenderness noted  Derm: warm, dry Psych: normal mood and affect   Neurologic Exam Mental Status: alert; oriented to person, place and year; knowledge is normal for age; language is normal. Takes her awhile to come up with words, unable to provide much history and asks her daughter to speak for her Cranial Nerves: extraocular movements are full and conjugate; pupils are round reactive to light; symmetric facial strength; midline tongue and uvula Motor:  RLE with drop foot, LLE 5/5 strength, 5/5 strength in bilateral upper extremites; no pronator drift Sensory: grossly intact  Coordination: good finger-to-nose  bilaterally  Gait and Station:Not observed Reflexes: no clonus   Labs and Imaging: CBC BMET   Recent Labs Lab 10/30/16 1414 10/30/16 1435  WBC 4.2  --   HGB 10.5* 10.9*  HCT 34.0* 32.0*  PLT  204  --     Recent Labs Lab 10/30/16 1414 10/30/16 1435  NA 136 138  K 4.9 5.0  CL 109 108  CO2 17*  --   BUN 58* 63*  CREATININE 3.74* 3.90*  GLUCOSE 96 90  CALCIUM 8.6*  --      Mr Brain Wo Contrast  Result Date: 10/30/2016 CLINICAL DATA:  Non-small cell lung cancer. Stage IV Squamous cell carcinoma with metastatic disease to the bone. Confusion. Cough EXAM: MRI HEAD WITHOUT CONTRAST TECHNIQUE: Multiplanar, multiecho pulse sequences of the brain and surrounding structures were obtained without intravenous contrast. COMPARISON:  MRI the brain 11/28/2014 FINDINGS: Brain: Diffusion-weighted images demonstrated a prominent left frontal acute/subacute nonhemorrhagic infarct. A smaller area of acute/subacute nonhemorrhagic infarction is present in the left parietal lobe. Advanced white matter disease is present with multiple remote lacunar infarcts noted in the basal ganglia bilaterally. Dilated perivascular spaces are also present in the basal ganglia. White matter changes extend into the brainstem. Remote lacunar infarcts are present within the cerebellum bilaterally. Although contrast was not given, no definite mass lesion is present to suggest metastatic disease. Arachnoid cysts over the convexity are stable bilaterally. Vascular: Flow is present in the major intracranial arteries. Skull and upper cervical spine: Skullbase is within normal limits. No discrete osseous lesions are evident. The craniocervical junction is within normal limits. Sinuses/Orbits: The paranasal sinuses are clear. Bilateral lens replacements are noted. IMPRESSION: 1. Acute/subacute cortical and subcortical infarct involving the anterior left frontal lobe along the middle frontal gyrus. 2. Acute/subacute cortical infarct involving the left parietal lobe, potentially along the watershed territory. 3. Advanced atrophy and diffuse white matter disease compatible with chronic microvascular ischemic changes. 4. Remote lacunar  infarcts involving the basal ganglia bilaterally. 5. Diffuse white matter disease into the brainstem with remote lacunar infarcts in the cerebellum bilaterally, right greater than left. These results were called by telephone at the time of interpretation on 10/30/2016 at 1:40 pm to Dr. Curt Bears, who verbally acknowledged these results. At the request of Dr. Julien Nordmann, the patient was directed to the [facility] Emergency Department for evaluation of the acute infarct and admission if appropriate. Electronically Signed   By: San Morelle M.D.   On: 10/30/2016 13:43   Lovenia Kim, MD 10/30/2016, 9:24 PM PGY-1, [place] Intern pager: [phone], text pages welcome  I have separately seen and examined the patient. I have discussed the findings and exam with Dr Reesa Chew and agree with the above note.  My changes/additions are outlined in BLUE.   Smitty Cords, MD [place], PGY-2

## 2016-10-30 NOTE — ED Triage Notes (Signed)
Pt sent to ER from MRI for confirmed stroke. Pt LSN yesterday at chemo. Pt daughter states when she went to pick her up she was "babbling and not making any sense," pt was seen yesterday by oncologist and had blood work, urine and had MRI scheduled for today. Pt is a/o x4 at present, answering all questions appropriately.

## 2016-10-31 ENCOUNTER — Inpatient Hospital Stay (HOSPITAL_COMMUNITY): Payer: Medicare Other

## 2016-10-31 DIAGNOSIS — Z86718 Personal history of other venous thrombosis and embolism: Secondary | ICD-10-CM

## 2016-10-31 DIAGNOSIS — I639 Cerebral infarction, unspecified: Secondary | ICD-10-CM

## 2016-10-31 LAB — CBC
HEMATOCRIT: 32.1 % — AB (ref 36.0–46.0)
HEMOGLOBIN: 10.1 g/dL — AB (ref 12.0–15.0)
MCH: 27.9 pg (ref 26.0–34.0)
MCHC: 31.5 g/dL (ref 30.0–36.0)
MCV: 88.7 fL (ref 78.0–100.0)
Platelets: 183 10*3/uL (ref 150–400)
RBC: 3.62 MIL/uL — ABNORMAL LOW (ref 3.87–5.11)
RDW: 14.1 % (ref 11.5–15.5)
WBC: 4.1 10*3/uL (ref 4.0–10.5)

## 2016-10-31 LAB — VAS US CAROTID
LCCADSYS: 55 cm/s
LEFT ECA DIAS: -5 cm/s
LEFT VERTEBRAL DIAS: 15 cm/s
Left CCA dist dias: 17 cm/s
Left CCA prox dias: 11 cm/s
Left CCA prox sys: 47 cm/s
Left ICA dist dias: -16 cm/s
Left ICA dist sys: -57 cm/s
Left ICA prox dias: -16 cm/s
Left ICA prox sys: -45 cm/s
RCCADSYS: -62 cm/s
RCCAPDIAS: 14 cm/s
RCCAPSYS: 75 cm/s
RIGHT ECA DIAS: -2 cm/s
RIGHT VERTEBRAL DIAS: 7 cm/s

## 2016-10-31 LAB — BASIC METABOLIC PANEL
ANION GAP: 9 (ref 5–15)
BUN: 54 mg/dL — ABNORMAL HIGH (ref 6–20)
CHLORIDE: 110 mmol/L (ref 101–111)
CO2: 18 mmol/L — AB (ref 22–32)
Calcium: 8.1 mg/dL — ABNORMAL LOW (ref 8.9–10.3)
Creatinine, Ser: 3.82 mg/dL — ABNORMAL HIGH (ref 0.44–1.00)
GFR calc non Af Amer: 10 mL/min — ABNORMAL LOW (ref >60)
GFR, EST AFRICAN AMERICAN: 12 mL/min — AB (ref >60)
Glucose, Bld: 211 mg/dL — ABNORMAL HIGH (ref 65–99)
POTASSIUM: 4.5 mmol/L (ref 3.5–5.1)
Sodium: 137 mmol/L (ref 135–145)

## 2016-10-31 LAB — TROPONIN I

## 2016-10-31 LAB — LIPID PANEL
CHOLESTEROL: 185 mg/dL (ref 0–200)
HDL: 26 mg/dL — ABNORMAL LOW (ref >40)
LDL Cholesterol: 129 mg/dL — ABNORMAL HIGH (ref 0–99)
Total CHOL/HDL Ratio: 7.1 RATIO
Triglycerides: 152 mg/dL — ABNORMAL HIGH (ref <150)
VLDL: 30 mg/dL (ref 0–40)

## 2016-10-31 LAB — TSH: TSH: 5.02 u[IU]/mL — AB (ref 0.350–4.500)

## 2016-10-31 MED ORDER — ENOXAPARIN SODIUM 30 MG/0.3ML ~~LOC~~ SOLN
30.0000 mg | SUBCUTANEOUS | Status: DC
  Administered 2016-10-31 – 2016-11-03 (×4): 30 mg via SUBCUTANEOUS
  Filled 2016-10-31 (×4): qty 0.3

## 2016-10-31 NOTE — Progress Notes (Signed)
OT Cancellation Note  Patient Details Name: [name] MRN: [id number] DOB: 01/26/1934   Cancelled Treatment:    Reason Eval/Treat Not Completed: Patient at procedure or test/ unavailable  [place], OT/L  [phone] 10/31/2016 10/31/2016, 11:43 AM

## 2016-10-31 NOTE — Progress Notes (Signed)
Family Medicine Teaching Service Daily Progress Note Intern Pager: [phone]  Patient name: [name] Medical record number: [id number] Date of birth: Jun 09, 1933 Age: 83 y.o. Gender: female  Primary Care Provider: Celene Squibb, MD Consultants: Neurology  Code Status: FULL   Pt Overview and Major Events to Date:  Admit 5/30   Assessment and Plan: [name] is a 83 y.o. female presenting with stroke. PMH is significant for history of stage IV lung cancer currently on chemotherapy, atrial fibrillation not on A/C, h/o multiple DVTs,  HLD, CKD III, and CHF.     Aphasia 2/2 multiple left hemispheric strokes, likely embolic:  Symptoms improving.  This AM patient still with difficulty with word finding however much more conversational.  No focal weakness noted.  Outpatient MRI at [facility] was ordered showing multiple strokes; cortical and subcortical infarct involving the anterior left frontal lobe along the middle frontal gyrus, cortical infarct involving the left parietal lobe, and potentially along the watershed territory.   Neuro exam on admission with continued aphasia and some difficulty finding words as well as right foot drop (chronic) and otherwise unremarkable. Patient is allergic to ASA and was started on Plavix 75 mg in ED. EKG with NSR with t-wave inversions in lateral leads, stable from prior EKGs. Troponin negative and ACS is unlikely.  SCr of 3.74 but otherwise unremarkable.   -Neurology consulted, appreciate recs  -Risk start labs ordered : TSH elevated to 5.020, A1c pending, lipid panel with LDL 129.  -ECHO pending.  If negative, would recommend TEE.  -Carotid dopplers pending  -LE dopplers pending  -Will allow permissive HTN  -continue Plavix 75 mg daily  -Neuro checks Q2h x12 h then Q4 hours  -Fall precautions  -SLP/PT/OT recs  -Vitals per unit routine  H/o Stage IV lung CA with mets to bone. Currently on chemotherapy. Follows outpatient with Oncology. Per daughter  has had poor po intake while on chemotherapy but is otherwise well. -May benefit from palliative consult on this hospitalization > consulted placed  -Continue to monitor   Hx of CAD: Hx of CAD s/p stenting in 2007.   - Holding Metoprolol for now for permissive htn  HFpEF: Last Echo 06/2015 with EF 50-55% G1DD with enhanced echogenicity, thinning and akinesis of the inferior wallconsistent with prior infarct.  On exam no evidence of fluid overload and lung exam without crackles noted.  She denies shortness of breath on exertion.  At home on 40 mg Lasix as needed for edema and Lopressor.  -Will hold Lasix and Lopressor for now  -Echo as above  -I's and O's  -daily weights   Hx of Afib: In NSR on exam. Not currently on anticoagulation. - Holding metoprolol as above  H/o multiple DVT's. Stable.  Patient with no calf tenderness or warmth noted on exam.   -Vas US dopplers ordered  ?Acute on CKD Stage IV.  SCr of 3.74.  BL appears ~3.1-3.6.  Follows outpatient with Nephrology. No uremia. Electrolytes stable.  -Daily BMET  - Avoid nephrotoxic agents - Consider inpatient nephro referral if Cr trends upwards  Constipation.  Last bowel movement was on Saturday.   -Will add bowel regimen   Abnormal UA: Moderate bacteria, large blood, large LE, and + Nitrites on UA yesterday. Patient afebrile and asymptomatic. Has chronic foley in. S/p 1 dose of antibiotic (unsure which one this was) - Will hold off on abx at this time as patient is afebrile and asymptomatic and we have a reason for  her change in mental status. Additionally, has a chronic foley in.  Chronic Foley catheter: Patient has had foley in for 1 year. Unclear why. Is changed on the 1st of every month.  - Will order for foley to be changed  FEN/GI: Regular diet, IVF @ [facility] Prophylaxis: Lovenox   Disposition: Dispo pending clinical improvement.   Subjective:  Patient states she is feeling much better this morning.  She is able  to speak more than on admission however notes she still struggles sometimes to find words.  She reports no weakness or changes overnight.  [name] is at her bedside.    Objective: Temp:  [97.3 F (36.3 C)-98.3 F (36.8 C)] 97.3 F (36.3 C) (05/31 1010) Pulse Rate:  [62-85] 85 (05/31 1010) Resp:  [9-22] 20 (05/31 1010) BP: (123-175)/(48-95) 162/62 (05/31 1010) SpO2:  [94 %-100 %] 98 % (05/31 1010) Weight:  [123 lb 4.8 oz (55.9 kg)] 123 lb 4.8 oz (55.9 kg) (05/30 2343) Physical Exam: General: pleasant 83 yo F, NAD, pastor at bedside  Eyes: EOMI, PERRL ENTM: MMM, o/p clear  Neck: supple, no JVD Cardiovascular: RRR no MRG, palpable pulses  Respiratory: CTAB, no increased work of breathing  Gastrointestinal: soft, NTND, +bs  MSK: no edema or tenderness noted  Derm: warm, dry Neuro: AOx4, facial movements are symmetric, speech normal with no slurring noted, sensation intact, R foot drop.  LLE and UE 5/5 strength bilaterally.   Psych: normal mood and affect   Laboratory:  Recent Labs Lab 10/29/16 0823 10/30/16 1414 10/30/16 1435 10/31/16 1120  WBC 4.5 4.2  --  4.1  HGB 10.7* 10.5* 10.9* 10.1*  HCT 32.5* 34.0* 32.0* 32.1*  PLT 206 204  --  183    Recent Labs Lab 10/29/16 0823 10/30/16 1414 10/30/16 1435 10/31/16 1120  NA 141 136 138 137  K 4.8 4.9 5.0 4.5  CL  --  109 108 110  CO2 17* 17*  --  18*  BUN 64.2* 58* 63* 54*  CREATININE 3.8* 3.74* 3.90* 3.82*  CALCIUM 8.9 8.6*  --  8.1*  PROT 7.1 7.4  --   --   BILITOT 0.57 0.6  --   --   ALKPHOS 130 118  --   --   ALT 6 8*  --   --   AST 11 15  --   --   GLUCOSE 112 96 90 211*   Imaging/Diagnostic Tests: Mr Brain Wo Contrast  Result Date: 10/30/2016 CLINICAL DATA:  Non-small cell lung cancer. Stage IV Squamous cell carcinoma with metastatic disease to the bone. Confusion. Cough EXAM: MRI HEAD WITHOUT CONTRAST TECHNIQUE: Multiplanar, multiecho pulse sequences of the brain and surrounding structures were obtained  without intravenous contrast. COMPARISON:  MRI the brain 11/28/2014 FINDINGS: Brain: Diffusion-weighted images demonstrated a prominent left frontal acute/subacute nonhemorrhagic infarct. A smaller area of acute/subacute nonhemorrhagic infarction is present in the left parietal lobe. Advanced white matter disease is present with multiple remote lacunar infarcts noted in the basal ganglia bilaterally. Dilated perivascular spaces are also present in the basal ganglia. White matter changes extend into the brainstem. Remote lacunar infarcts are present within the cerebellum bilaterally. Although contrast was not given, no definite mass lesion is present to suggest metastatic disease. Arachnoid cysts over the convexity are stable bilaterally. Vascular: Flow is present in the major intracranial arteries. Skull and upper cervical spine: Skullbase is within normal limits. No discrete osseous lesions are evident. The craniocervical junction is within normal limits. Sinuses/Orbits: The paranasal  sinuses are clear. Bilateral lens replacements are noted. IMPRESSION: 1. Acute/subacute cortical and subcortical infarct involving the anterior left frontal lobe along the middle frontal gyrus. 2. Acute/subacute cortical infarct involving the left parietal lobe, potentially along the watershed territory. 3. Advanced atrophy and diffuse white matter disease compatible with chronic microvascular ischemic changes. 4. Remote lacunar infarcts involving the basal ganglia bilaterally. 5. Diffuse white matter disease into the brainstem with remote lacunar infarcts in the cerebellum bilaterally, right greater than left. These results were called by telephone at the time of interpretation on 10/30/2016 at 1:40 pm to Dr. Curt Bears, who verbally acknowledged these results. At the request of Dr. Julien Nordmann, the patient was directed to the [facility] Emergency Department for evaluation of the acute infarct and admission if appropriate.  Electronically Signed   By: San Morelle M.D.   On: 10/30/2016 13:43   Lovenia Kim, MD 10/31/2016, 12:25 PM PGY-1, [place] Intern pager: [phone], text pages welcome

## 2016-10-31 NOTE — Evaluation (Signed)
Speech Language Pathology Evaluation Patient Details Name: [name] MRN: [id number] DOB: 06-07-1933 Today's Date: 10/31/2016 Time: 1100-1145 SLP Time Calculation (min) (ACUTE ONLY): 45 min  Problem List:  Patient Active Problem List   Diagnosis Date Noted  . Acute CVA (cerebrovascular accident) ([place]) 10/30/2016  . Encounter for antineoplastic immunotherapy 09/30/2016  . Goals of care, counseling/discussion 09/30/2016  . Port catheter in place 03/20/2016  . Abdominal pain   . Acute on chronic renal failure ([place])   . Acute on chronic respiratory failure ([place]) 07/25/2015  . SOB (shortness of breath) 07/25/2015  . Catheter-associated urinary tract infection ([place]) 07/20/2015  . Atrial fibrillation ([place]) 07/19/2015  . Acute on chronic diastolic (congestive) heart failure ([place]) 07/19/2015  . UTI (lower urinary tract infection) 07/17/2015  . Hyperkalemia 07/17/2015  . Dehydration 07/17/2015  . Right foot ulcer ([place])   . Abscess   . Right leg DVT ([place]) 06/24/2015  . Diarrhea 06/24/2015  . Edema   . Sepsis ([place])   . Malnutrition of moderate degree 06/18/2015  . Hypothermia 06/16/2015  . Paraspinal abscess ([place])   . Acute renal failure superimposed on stage 3 chronic kidney disease ([place]) 03/17/2015  . Anemia of chronic disease 03/17/2015  . Hypokalemia 03/17/2015  . Hypotension 02/23/2015  . Lung cancer stage IV with Metastatic squamous cell carcinoma to bone ([place]) 11/28/2014  . Pressure ulcer 11/24/2014   Past Medical History:  Past Medical History:  Diagnosis Date  . A-fib ([place])   . Chronic anemia   . Chronic diastolic (congestive) heart failure ([place])   . Chronic fatigue 04/04/2015  . Chronic fatigue 04/04/2015  . CKD (chronic kidney disease)   . Coronary artery disease    a.  LHC (06/04/05): LHC done in [place] with high grade RCA => s/p BMS to RCA;  b.  Nuclear (09/14/09): Lexiscan; Inf infarct with mild peri-infarct ishemia, EF 52%; Low Risk.  Marland Kitchen DVT (deep venous  thrombosis) ([place])   . Foot drop, right   . GERD (gastroesophageal reflux disease)   . Goals of care, counseling/discussion 09/30/2016  . Hypercholesterolemia   . Hypertension   . Ischemic cardiomyopathy    a. Echo (07/26/13): Mild LVH, EF 35-40%, diff HK, inf AK, Gr 2 DD, Tr AI, mildly dilated Ao root, MAC, mild MR, mild LAE, mod reduced RVSF.  . Non-small cell carcinoma of lung ([place])    Stage IV;spinal cancer; had chemo and radiation and immune therapy   Past Surgical History:  Past Surgical History:  Procedure Laterality Date  . ABDOMINAL HYSTERECTOMY    . APPENDECTOMY    . CATARACT EXTRACTION Bilateral   . CHOLECYSTECTOMY  2011  . CYSTOSCOPY     with stent placement  . CYSTOSCOPY W/ URETERAL STENT PLACEMENT Right 12/12/2015   Procedure: CYSTOSCOPY WITH RETROGRADE PYELOGRAM WITH STENT EXCHANGE;  Surgeon: Franchot Gallo, MD;  Location: [facility];  Service: Urology;  Laterality: Right;  . ERCP  2011   with stone extraction, done same day as cholecystectomy  . HEMATOMA EVACUATION  December 2006   groin  . HEMORROIDECTOMY    . LAMINECTOMY    . PORTACATH PLACEMENT    . TONSILLECTOMY     HPI:  83 year old female admitted 10/30/16 due to difficulty with speech. PMH significant for satge IV lung cancer, HTN, CKD IV, Afib, anemia. MRI reveals (sub)acute cortical infarct, advanced atrophy, remote lacunar infarcts. SLE requested due to multiple left CVAs with aphasia.   Assessment / Plan / Recommendation Clinical Impression  Pt presents with mild auditory comprehension deficits, which may be more related to poor hearing acuity. Difficulty noted with complex yes/no questions and following multi-step verbal commands. Pt demonstrates comprehension of phrase length written material. Expressively, pt exhibits moderate verbal expression deficits, characterized by high level word retrieval difficulty. Automatic sequences, repetition, simple sentence completion, and basic confrontational and  responsive naming are intact.  Deficits noted with abstract tasks, homonyms, and verbal fluency (named 7 animals in 1 minute, where n=15+). Continued ST intervention is recommended after acute DC, either through home health or outpatient services. ST will continue to follow acutely.     SLP Assessment  SLP Recommendation/Assessment: Patient needs continued Speech Language Pathology Services SLP Visit Diagnosis: Aphasia (R47.01)    Follow Up Recommendations  Home health SLP;Outpatient SLP    Frequency and Duration min 1 x/week  2 weeks      SLP Evaluation Cognition  Overall Cognitive Status:  (not assessed - high level language deficits) Arousal/Alertness: Awake/alert Orientation Level: Oriented to person;Oriented to place;Oriented to time;Disoriented to situation       Comprehension  Auditory Comprehension Overall Auditory Comprehension: Impaired Commands: Impaired (difficulty with complex commands, although this may be due to decreased hearing acuity.) Complex Commands: 75-100% accurate Conversation: Simple Interfering Components: Hearing EffectiveTechniques: Repetition;Slowed speech Reading Comprehension Reading Status:  (phrase level reading compr intact)    Expression Expression Primary Mode of Expression: Verbal Verbal Expression Overall Verbal Expression: Impaired Initiation: No impairment Automatic Speech: Name;Social Response;Counting;Day of week;Month of year Level of Generative/Spontaneous Verbalization: Conversation Repetition: No impairment Naming: Impairment Responsive: 76-100% accurate Confrontation: Within functional limits Convergent: 25-49% accurate Divergent: 25-49% accurate Verbal Errors: Aware of errors Pragmatics: No impairment Written Expression Dominant Hand: Right Written Expression: Not tested   Oral / Motor  Oral Motor/Sensory Function Overall Oral Motor/Sensory Function: Within functional limits Motor Speech Overall Motor Speech: Appears  within functional limits for tasks assessed   GO                   Johntae Broxterman B. Quentin Ore [facility], CCC-SLP [phone]  Shonna Chock 10/31/2016, 11:58 AM

## 2016-10-31 NOTE — Care Management Note (Signed)
Case Management Note  Patient Details  Name: [name] MRN: [id number] Date of Birth: 1934-03-31  Subjective/Objective:   Pt admitted with CVA. She is from home alone.                 Action/Plan: Awaiting PT/OT recommendations. CM following for d/c needs, physician orders.   Expected Discharge Date:                  Expected Discharge Plan:     In-House Referral:     Discharge planning Services     Post Acute Care Choice:    Choice offered to:     DME Arranged:    DME Agency:     HH Arranged:    HH Agency:     Status of Service:  In process, will continue to follow  If discussed at Long Length of Stay Meetings, dates discussed:    Additional Comments:  Pollie Friar, RN 10/31/2016, 10:35 AM

## 2016-10-31 NOTE — Progress Notes (Signed)
VASCULAR LAB PRELIMINARY  PRELIMINARY  PRELIMINARY  PRELIMINARY  Carotid duplex completed.    Preliminary report:  1-39% ICA stenosis.  Vertebral artery flow is antegrade.   Shelagh Rayman, RVT 10/31/2016, 12:18 PM

## 2016-10-31 NOTE — Progress Notes (Signed)
VASCULAR LAB PRELIMINARY  PRELIMINARY  PRELIMINARY  PRELIMINARY  Bilateral lower extremity venous duplex completed.    Preliminary report:  There is no DVT or SVT noted in the bilateral lower extremities.   Jenel Gierke, RVT 10/31/2016, 12:29 PM

## 2016-10-31 NOTE — Consult Note (Signed)
Requesting Physician: Family Medicine Service    Chief Complaint: Strokes found on MRI  History obtained from:  Patient and Chart   HPI:                                                                                                                                      [name] is an 83 y.o. female who presented to [facility] following an MRI brain positive for acute/subacute infarcts at [facility]. Patient has a history of lung cancer and was at her chemotherapy center yesterday when her daughter noted babbling and slurring of her speech. She was taken to [facility] for imaging and subsequently transferred here from [place] for stroke workup.   On my visit, she denies headache, dizziness, double vision and pain during the event and presently. Also without aphasia and dysarthria.  Past medical history is significant for newly diagnosed atrial fibrillation, non-small cell lung cancer, CAD, CKD, right "foot drop" - inability to dorsiflex right ankle.  MRI brain 5/30: 1. Acute/subacute cortical and subcortical infarct involving the anterior left frontal lobe along the middle frontal gyrus. 2. Acute/subacute cortical infarct involving the left parietal lobe, potentially along the watershed territory. 3. Advanced atrophy and diffuse white matter disease compatible with chronic microvascular ischemic changes. 4. Remote lacunar infarcts involving the basal ganglia bilaterally. 5. Diffuse white matter disease into the brainstem with remote lacunar infarcts in the cerebellum bilaterally, right greater than left.   Date last known well: Date: 10/30/2016 Time last known well: Unable to determine  tPA Given: No: Out of window.  Modified Rankin Score: Rankin Score=1  Stroke Risk Factors - atrial fibrillation, hypercoagulable state, hyperlipidemia and hypertension   Past Medical History:  Diagnosis Date  . A-fib ([place])   . Chronic anemia   . Chronic diastolic (congestive) heart failure ([place])    . Chronic fatigue 04/04/2015  . Chronic fatigue 04/04/2015  . CKD (chronic kidney disease)   . Coronary artery disease    a.  LHC (06/04/05): LHC done in [place] with high grade RCA => s/p BMS to RCA;  b.  Nuclear (09/14/09): Lexiscan; Inf infarct with mild peri-infarct ishemia, EF 52%; Low Risk.  Marland Kitchen DVT (deep venous thrombosis) ([place])   . Foot drop, right   . GERD (gastroesophageal reflux disease)   . Goals of care, counseling/discussion 09/30/2016  . Hypercholesterolemia   . Hypertension   . Ischemic cardiomyopathy    a. Echo (07/26/13): Mild LVH, EF 35-40%, diff HK, inf AK, Gr 2 DD, Tr AI, mildly dilated Ao root, MAC, mild MR, mild LAE, mod reduced RVSF.  . Non-small cell carcinoma of lung ([place])    Stage IV;spinal cancer; had chemo and radiation and immune therapy    Past Surgical History:  Procedure Laterality Date  . ABDOMINAL HYSTERECTOMY    . APPENDECTOMY    . CATARACT EXTRACTION Bilateral   . CHOLECYSTECTOMY  2011  .  CYSTOSCOPY     with stent placement  . CYSTOSCOPY W/ URETERAL STENT PLACEMENT Right 12/12/2015   Procedure: CYSTOSCOPY WITH RETROGRADE PYELOGRAM WITH STENT EXCHANGE;  Surgeon: Franchot Gallo, MD;  Location: [facility];  Service: Urology;  Laterality: Right;  . ERCP  2011   with stone extraction, done same day as cholecystectomy  . HEMATOMA EVACUATION  December 2006   groin  . HEMORROIDECTOMY    . LAMINECTOMY    . PORTACATH PLACEMENT    . TONSILLECTOMY      Family History  Problem Relation Age of Onset  . Heart failure Mother   . Lung cancer Sister   . Lung cancer Brother   . Stomach cancer Brother   . Heart attack Neg Hx   . Stroke Neg Hx   . Colon cancer Neg Hx      reports that she quit smoking about 25 years ago. Her smoking use included Cigarettes. She has a 35.00 pack-year smoking history. She has never used smokeless tobacco. She reports that she does not drink alcohol or use drugs.  Allergies  Allergen Reactions  . Amlodipine Swelling   . Ciprofloxacin Hives and Other (See Comments)    REACTION: weakness  . Mirtazapine Other (See Comments)    Hallucinations and nightmares, verbally aggressive   . Statins Other (See Comments)    Unknown to patient  . Aspirin Swelling    Mouth swelling & tongue Patient reports that she tolerates ibuprofen without a problem  . Cephalexin Hives  . Hydralazine Nausea And Vomiting  . Iron Nausea And Vomiting  . Ambien [Zolpidem Tartrate] Other (See Comments)    Confusion   . Lorazepam Other (See Comments)    Hallucinations, verbally aggressive  . Sulfonamide Derivatives Hives  . Zolpidem     Confusion  . Latex Rash  . Penicillins Other (See Comments)    From childhood: Has patient had a PCN reaction causing immediate rash, facial/tongue/throat swelling, SOB or lightheadedness with hypotension: Yes Has patient had a PCN reaction causing severe rash involving mucus membranes or skin necrosis: Yes Has patient had a PCN reaction that required hospitalization No Has patient had a PCN reaction occurring within the last 10 years: No If all of the above answers are "NO", then may proceed with Cephalosporin use.   . Shellfish Allergy Hives and Rash  . Tape Rash    MUST USE PAPER TAPE!!!! SKIN IS VERY THIN AND WILL TEAR EASILY!!    Medications:                                                                                                                       Current Meds  Medication Sig  . acetaminophen (TYLENOL) 500 MG tablet Take 500 mg by mouth daily as needed for mild pain.  . diazepam (VALIUM) 2 MG tablet Take 1 tablet (2 mg total) by mouth See admin instructions. Take 1 tab 1 hour prior to MRI. May repeat X1 after 1 hour if not  effective.  . diphenoxylate-atropine (LOMOTIL) 2.5-0.025 MG tablet Take 1 tablet by mouth 4 (four) times daily as needed for diarrhea or loose stools. Reported on 11/28/2015  . furosemide (LASIX) 40 MG tablet Take 40 mg by mouth daily as needed for fluid or  edema.   Marland Kitchen loperamide (IMODIUM) 2 MG capsule Take 1 capsule (2 mg total) by mouth every 6 (six) hours as needed for diarrhea or loose stools.  . metoprolol tartrate (LOPRESSOR) 25 MG tablet Take 0.5 tablets (12.5 mg total) by mouth at bedtime. (Patient taking differently: Take 25 mg by mouth See admin instructions. 25 mg in the morning and may take an additional 25 mg at bedtime if needed/as directed)  . pantoprazole (PROTONIX) 40 MG tablet Take 40 mg by mouth daily before breakfast.   . Pembrolizumab (KEYTRUDA IV) Inject into the vein every 21 ( twenty-one) days.    Review Of Systems:                                                                                                           History obtained from the patient  General: Negative for fever; positive fatigue but alert and energetic today Psychological: Negative for any known behavioral disorder; previous confusion appears to have resolved Ophthalmic: Negative for blurry or double vision, loss of vision in any field ENT: Negative for abrupt loss of hearing, tinnitus or vertigo Respiratory: Negative for cough, shortness of breath or wheezing Cardiovascular: Negative for chest pain, dyspnea on exertion, edema; positive irregularly irregular heartbeat Gastrointestinal: Negative for abdominal pain, diarrhea Genito-Urinary: Negative for dysuria, hematuria Musculoskeletal: Negative for joint swelling or muscular weakness except for loss of dorsiflexion of the right ankle Neurological: As noted in HPI Dermatological: Negative for rash or skin changes, numbness or tingling  Blood pressure (!) 162/62, pulse 85, temperature 97.3 F (36.3 C), temperature source Oral, resp. rate 20, height _0  (1.727 m), weight 55.9 kg (123 lb 4.8 oz), SpO2 98 %.   Physical Examination:                                                                                                      General: WDWN female laying comfortably in bed HEENT:  Normocephalic,  no lesions, without obvious abnormality.  Normal external eye and conjunctiva.  Normal external ears. Normal external nose, mucus membranes and septum.  Normal pharynx. Cardiovascular: irregularly irregular rhythm, pulses palpable throughout   Pulmonary: chest clear, no wheezing, rales, normal symmetric air entry Abdomen: Soft, non-tender Extremities: Right ankle in extended position. Unable to flex. Extremities normal for age otherwise. Musculoskeletal: no joint tenderness, deformity or swelling other than above.  Tone and bulk normal for age Skin: warm and dry, no hyperpigmentation, vitiligo, or suspicious lesions  Neurological Examination:                                                                                               Mental Status: SHAQUELLA STAMANT is alert, oriented x 4, thought content appropriate.  Speech fluent without evidence of aphasia or dysarthria on my visit. Able to follow 3-step commands, although she does process the commands slowly, often beginning them rapidly and needing direction mid-way through. Cranial Nerves: II: Visual fields grossly normal, pupils are equal, round, reactive to light and accommodation. III,IV, VI: Ptosis not present, extra-ocular muscle movements intact bilaterally V,VII: Smile and eyebrow raise is symmetric. Facial light touch and pinprick sensation intact bilaterally VIII: Hearing grossly intact to loud conversation IX,X: Uvula and palate rise symmetrically XI: SCM and bilateral shoulder shrug strength 5/5 XII: Midline tongue extension Motor: Right :    Upper extremity   5/5        Lower extremity   4/5 without ability to dorsiflex ankle; plantar flexion 4-/5 Left Upper extremity   5/5        Left Lower extremity   5/5 Slight pronator drift present on the right; left within normal limits Sensory: Pinprick and light touch intact throughout, bilaterally Deep Tendon Reflexes: 2+ and symmetric throughout BUE, 1+ left patellar, Mute in RLE  and AJ bilaterally Plantars: Right: Mute   Left: Downgoing Cerebellar: Finger-to-nose test without evidence of dysmetria or ataxia. Heel-to-shin test executed within normal limits. Gait: Not tested  Lab Results: Basic Metabolic Panel:  Recent Labs Lab 10/29/16 0823 10/30/16 1414 10/30/16 1435 10/31/16 1120  NA 141 136 138 137  K 4.8 4.9 5.0 4.5  CL  --  109 108 110  CO2 17* 17*  --  18*  GLUCOSE 112 96 90 211*  BUN 64.2* 58* 63* 54*  CREATININE 3.8* 3.74* 3.90* 3.82*  CALCIUM 8.9 8.6*  --  8.1*    Liver Function Tests:  Recent Labs Lab 10/29/16 0823 10/30/16 1414  AST 11 15  ALT 6 8*  ALKPHOS 130 118  BILITOT 0.57 0.6  PROT 7.1 7.4  ALBUMIN 3.2* 3.2*   No results for input(s): LIPASE, AMYLASE in the last 168 hours. No results for input(s): AMMONIA in the last 168 hours.  CBC:  Recent Labs Lab 10/29/16 0823 10/30/16 1414 10/30/16 1435 10/31/16 1120  WBC 4.5 4.2  --  4.1  NEUTROABS 3.1 2.6  --   --   HGB 10.7* 10.5* 10.9* 10.1*  HCT 32.5* 34.0* 32.0* 32.1*  MCV 87.3 89.2  --  88.7  PLT 206 204  --  183    Cardiac Enzymes:  Recent Labs Lab 10/31/16 0025 10/31/16 0415 10/31/16 1120  TROPONINI <0.03 <0.03 <0.03    Lipid Panel:  Recent Labs Lab 10/31/16 0415  CHOL 185  TRIG 152*  HDL 26*  CHOLHDL 7.1  VLDL 30  LDLCALC 129*    CBG: No results for input(s): GLUCAP in the last 168 hours.  Microbiology: Results for orders placed or  performed during the hospital encounter of 11/28/15  Urine culture     Status: Abnormal   Collection Time: 11/28/15  1:58 AM  Result Value Ref Range Status   Specimen Description URINE, CLEAN CATCH  Final   Special Requests NONE  Final   Culture MULTIPLE SPECIES PRESENT, SUGGEST RECOLLECTION (A)  Final   Report Status 11/29/2015 FINAL  Final    Coagulation Studies:  Recent Labs  10/30/16 1414  LABPROT 14.7  INR 1.15    Imaging: Mr Brain Wo Contrast  Result Date: 10/30/2016 CLINICAL DATA:   Non-small cell lung cancer. Stage IV Squamous cell carcinoma with metastatic disease to the bone. Confusion. Cough EXAM: MRI HEAD WITHOUT CONTRAST TECHNIQUE: Multiplanar, multiecho pulse sequences of the brain and surrounding structures were obtained without intravenous contrast. COMPARISON:  MRI the brain 11/28/2014 FINDINGS: Brain: Diffusion-weighted images demonstrated a prominent left frontal acute/subacute nonhemorrhagic infarct. A smaller area of acute/subacute nonhemorrhagic infarction is present in the left parietal lobe. Advanced white matter disease is present with multiple remote lacunar infarcts noted in the basal ganglia bilaterally. Dilated perivascular spaces are also present in the basal ganglia. White matter changes extend into the brainstem. Remote lacunar infarcts are present within the cerebellum bilaterally. Although contrast was not given, no definite mass lesion is present to suggest metastatic disease. Arachnoid cysts over the convexity are stable bilaterally. Vascular: Flow is present in the major intracranial arteries. Skull and upper cervical spine: Skullbase is within normal limits. No discrete osseous lesions are evident. The craniocervical junction is within normal limits. Sinuses/Orbits: The paranasal sinuses are clear. Bilateral lens replacements are noted. IMPRESSION: 1. Acute/subacute cortical and subcortical infarct involving the anterior left frontal lobe along the middle frontal gyrus. 2. Acute/subacute cortical infarct involving the left parietal lobe, potentially along the watershed territory. 3. Advanced atrophy and diffuse white matter disease compatible with chronic microvascular ischemic changes. 4. Remote lacunar infarcts involving the basal ganglia bilaterally. 5. Diffuse white matter disease into the brainstem with remote lacunar infarcts in the cerebellum bilaterally, right greater than left. These results were called by telephone at the time of interpretation on  10/30/2016 at 1:40 pm to Dr. Curt Bears, who verbally acknowledged these results. At the request of Dr. Julien Nordmann, the patient was directed to the [facility] Emergency Department for evaluation of the acute infarct and admission if appropriate. Electronically Signed   By: San Morelle M.D.   On: 10/30/2016 13:43   History and examination documented by Lupita Raider PA-C, [facility] 10/31/2016, 1:08 PM  Assessment: 83 y.o. female who presented to [facility] following an MRI brain positive for acute/subacute infarcts at [facility]. Initially presented with acute onset of babbling and slurring of her speech while at chemotherapy 1. MRI reveals the following acute/subacute strokes: Acute/subacute cortical and subcortical infarct involving the anterior left frontal lobe along the middle frontal gyrus. Acute/subacute cortical infarct involving the left parietal lobe, potentially along the watershed territory.  2. Also noted on MRI: Advanced atrophy and diffuse white matter disease compatible with chronic microvascular ischemic changes. Remote lacunar infarcts involving the basal ganglia bilaterally. Diffuse white matter disease into the brainstem with remote lacunar infarcts in the cerebellum bilaterally, right greater than left. 3. History of lung cancer 4. Allergic to ASA.   Recommendations: 1. HgbA1c, fasting lipid panel 2. MRA of the brain without contrast 3. PT consult, OT consult, Speech consult 4. Echocardiogram 5. Carotid dopplers 6. Start Plavix 7. Risk factor modification 8. Telemetry monitoring 9. Frequent  neuro checks 10 NPO until passes stroke swallow screen 11 please page stroke NP  Or  PA  Or MD from 8am -4 pm  as this patient from this time will be  followed by the stroke.   You can look them up on [email or web address]  Password [email or web address]    Electronically signed: Dr. Kerney Elbe

## 2016-10-31 NOTE — Progress Notes (Signed)
Occupational Therapy Evaluation Patient Details Name: [name] MRN: [id number] DOB: Jun 26, 1933 Today's Date: 10/31/2016    History of Present Illness 83 Y/O F with PMX of Lung CA, HTN, Ischemic cardiomyopathy,HLD, GERD,DVT,CKD stage IV, Afib, chronic anemia presented with hx of difficulty with her speech MRI + L frontal/parietal infarct.   Clinical Impression   Pt admitted with the above diagnoses and presents with below problem list. Pt will benefit from continued acute OT to address the below listed deficits and maximize independence with basic ADLs prior to d/c home with assistance. PTA pt completed SPT with assistance from w/c to/from bed, mod A with LB ADLS, mostly setup with UB ADLs. Pt is currently max A with LB ADLs, mod A with SPT. Noted expressive difficulties and pt's daughter reports decline in mentation compared to her baseline.      Follow Up Recommendations  Home health OT;Supervision - Intermittent;Other (comment) (OOB/transfers)    Equipment Recommendations  3 in 1 bedside commode    Recommendations for Other Services       Precautions / Restrictions Precautions Precautions: Fall Restrictions Weight Bearing Restrictions: No      Mobility Bed Mobility Overal bed mobility: Needs Assistance Bed Mobility: Sit to Supine     Supine to sit: HOB elevated;Min guard Sit to supine: Min guard;HOB elevated   General bed mobility comments: no rail needed,  no assist needed  Transfers Overall transfer level: Needs assistance Equipment used: Rolling walker (2 wheeled) Transfers: Sit to/from [facility] Sit to Stand: Mod assist Stand pivot transfers: Mod assist       General transfer comment: mod A to powerup from regular height of recliner which is lower than pt's w/c seat height. Discussed boosting with pillows in the future. Also uncontrolled descent onto EOB (unsupported seating).     Balance Overall balance assessment: Needs  assistance Sitting-balance support: No upper extremity supported Sitting balance-Leahy Scale: Good       Standing balance-Leahy Scale: Poor Standing balance comment: reliant on the RW for stability                           ADL either performed or assessed with clinical judgement   ADL Overall ADL's : Needs assistance/impaired Eating/Feeding: Set up;Sitting   Grooming: Set up;Sitting   Upper Body Bathing: Sitting;Moderate assistance   Lower Body Bathing: Maximal assistance;Sit to/from stand   Upper Body Dressing : Minimal assistance;Sitting   Lower Body Dressing: Maximal assistance;Sit to/from stand   Toilet Transfer: Moderate assistance;Stand-pivot;RW Toilet Transfer Details (indicate cue type and reason): simulated with SPT recliner>EOB Toileting- Clothing Manipulation and Hygiene: Maximal assistance;Sit to/from Nurse, children's Details (indicate cue type and reason): n/a sponge bathes at baseline   General ADL Comments: Pt completed SPT from recliner>EOB and bed mobility as detailed above. Discussed ADLs with pt and daughter.      Vision Patient Visual Report: No change from baseline       Perception     Praxis      Pertinent Vitals/Pain Pain Assessment: No/denies pain     Hand Dominance Right   Extremity/Trunk Assessment Upper Extremity Assessment Upper Extremity Assessment: Generalized weakness;Overall WFL for tasks assessed   Lower Extremity Assessment Lower Extremity Assessment: Defer to PT evaluation RLE Deficits / Details: R foot drop with contracture ~25+* df lag from neutral.  Mildly weak overall at 4-/5 RLE Coordination: decreased fine motor LLE Deficits / Details: grossly [facility]  at >=4/5, mild proximal weakness bil.       Communication Communication Communication: Expressive difficulties   Cognition Arousal/Alertness: Awake/alert Behavior During Therapy: WFL for tasks assessed/performed Overall Cognitive Status:  Impaired/Different from baseline Area of Impairment: Attention;Safety/judgement;Problem solving                   Current Attention Level: Selective     Safety/Judgement: Decreased awareness of safety   Problem Solving: Slow processing     General Comments  pt is interested in getting her right foot down to floor in stance.   Pt with contracture R foot as a result of long term foot drop with poor positioning in the past.  Not ready yet for AFO.    Exercises     Shoulder Instructions      Home Living Family/patient expects to be discharged to:: Private residence Living Arrangements: Alone Available Help at Discharge: Family;Personal care attendant;Available PRN/intermittently (paid PCA 2 hrs am/2 hrs pm, PCA for adls, PT)) Type of Home: Mobile home Home Access: Stairs to enter;Ramped entrance Entrance Stairs-Number of Steps: 6   Home Layout: One level     Bathroom Shower/Tub: Walk-in shower;Tub/shower unit   Bathroom Toilet: Standard     Home Equipment: Cane - single point;Walker - standard;Transport chair;Bedside commode;Hospital bed;Wheelchair - manual   Additional Comments: Pt is home alone for portions of the day.    Lives With: Alone    Prior Functioning/Environment Level of Independence: Needs assistance  Gait / Transfers Assistance Needed: pt/dtr report pt can manage to transfer bed to w/c or back without assist and use of a RW.  There are also times pt needs minimal assist ADL's / Homemaking Assistance Needed: pt is assisted with sponge baths. Mod A with LB ADLs, setup with UB dressing, min A with UB bathing (to clean her back).            OT Problem List: Impaired balance (sitting and/or standing);Decreased knowledge of use of DME or AE;Decreased knowledge of precautions;Cardiopulmonary status limiting activity;Decreased cognition;Decreased safety awareness      OT Treatment/Interventions: Self-care/ADL training;Energy conservation;DME and/or AE  instruction;Therapeutic activities;Patient/family education;Balance training    OT Goals(Current goals can be found in the care plan section) Acute Rehab OT Goals Patient Stated Goal: To be able to walk again OT Goal Formulation: With patient/family Time For Goal Achievement: 11/14/16 Potential to Achieve Goals: Good ADL Goals Pt Will Perform Upper Body Bathing: with set-up;sitting Pt Will Perform Lower Body Bathing: with mod assist;sit to/from stand Pt Will Perform Upper Body Dressing: with set-up;sitting Pt Will Perform Lower Body Dressing: with mod assist;sit to/from stand Pt Will Transfer to Toilet: with min assist;stand pivot transfer;bedside commode Pt Will Perform Toileting - Clothing Manipulation and hygiene: sit to/from stand;with mod assist  OT Frequency: Min 2X/week   Barriers to D/C:            Co-evaluation              AM-PAC PT "6 Clicks" Daily Activity     Outcome Measure                 End of Session Equipment Utilized During Treatment: Gait belt;Rolling walker  Activity Tolerance: Patient tolerated treatment well;Patient limited by fatigue Patient left: in bed;with call bell/phone within reach;with bed alarm set;with family/visitor present  OT Visit Diagnosis: Unsteadiness on feet (R26.81);Muscle weakness (generalized) (M62.81);Other symptoms and signs involving cognitive function  Time: 8984-2103 OT Time Calculation (min): 22 min Charges:  OT General Charges $OT Visit: 1 Procedure OT Evaluation $OT Eval Low Complexity: 1 Procedure G-Codes:       Hortencia Pilar 10/31/2016, 3:09 PM

## 2016-10-31 NOTE — Consult Note (Signed)
[facility] CM Primary Care Navigator  10/31/2016  JAYLEENA STILLE 23-Jun-1933 [id number]   Met with patient at the bedside to identify possible discharge needs. Patientreports having difficulty with speech - "babbling" and confusion that had led to this admission. Patient endorses Dr. Allyn Kenner with Delphina Cahill MD office [facility]) as her primary care provider.   Patient shared using [facility] in [place] obtain medications without any problem.   Patient states that she manages her medications at home straight out of the containers.   Patient mentioned that her daughter Katharine Look) who lives nearby providestransportation toherdoctors'appointments.    Patient verbalized that she lives alone and independent with self care prior to this admission. Her daughter assists with her care needs at home.  Anticipated discharge plan ishomewith home health services per therapists recommendation.  Patient voiced understanding to follow-up with primary care providerwhen she returns home,for a post hospital visit within a week or sooner if needed.Patient letter (with PCP's contact number) was provided as a reminder.  Explained to patient about [facility] CM services available for health management at home. She optedand verbally agreed to [facility] strokecalls for follow-up and help with her recovery.   Referral order was already in for EMMI stroke calls after discharge.   For questions, please contact:  Dannielle Huh, BSN, RN- [facility] Primary Care Navigator  Telephone: [phone] [place]

## 2016-10-31 NOTE — Evaluation (Signed)
Physical Therapy Evaluation Patient Details Name: [name] MRN: [id number] DOB: 04/14/1934 Today's Date: 10/31/2016   History of Present Illness  83 Y/O F with PMX of Lung CA, HTN, Ischemic cardiomyopathy,HLD, GERD,DVT,CKD stage IV, Afib, chronic anemia presented with hx of difficulty with her speech MRI + L frontal/parietal infarct.  Clinical Impression  Pt admitted with/for speech difficulty and confusion. MRI showing acute/subacute infarct L frontal/parietal.  Basic mobility requiring min guard to min assist..  Pt currently limited functionally due to the problems listed below.  (see problems list.)  Pt will benefit from PT to maximize function and safety to be able to get home safely with available assist of family and PCA's.     Follow Up Recommendations Home health PT;Supervision for mobility/OOB    Equipment Recommendations  None recommended by PT    Recommendations for Other Services       Precautions / Restrictions Precautions Precautions: Fall      Mobility  Bed Mobility Overal bed mobility: Needs Assistance Bed Mobility: Supine to Sit     Supine to sit: HOB elevated;Min guard     General bed mobility comments: no rail needed,  no assist needed  Transfers Overall transfer level: Needs assistance Equipment used: Rolling walker (2 wheeled) Transfers: Sit to/from [facility] Sit to Stand: Min guard Stand pivot transfers: Min assist       General transfer comment: pivot steps in RW.  R contractured in pf, ~25* from neutral and firm end range  Ambulation/Gait             General Gait Details: pt does not walk at this point due mostly to R ankle contracture/instability  Stairs            Wheelchair Mobility    Modified Rankin (Stroke Patients Only) Modified Rankin (Stroke Patients Only) Pre-Morbid Rankin Score: Slight disability Modified Rankin: Moderately severe disability     Balance Overall balance assessment:  Needs assistance Sitting-balance support: No upper extremity supported Sitting balance-Leahy Scale: Good       Standing balance-Leahy Scale: Poor Standing balance comment: reliant on the RW for stability                             Pertinent Vitals/Pain Pain Assessment: No/denies pain    Home Living Family/patient expects to be discharged to:: Private residence Living Arrangements: Alone Available Help at Discharge: Family;Personal care attendant;Available PRN/intermittently (paid PCA 2 hrs am/2 hrs pm, PCA for adls, PT) Type of Home: Mobile home Home Access: Stairs to enter;Ramped entrance   Entrance Stairs-Number of Steps: 6 Home Layout: One level Home Equipment: Cane - single point;Walker - standard;Transport chair;Bedside commode;Hospital bed;Wheelchair - manual      Prior Function Level of Independence: Needs assistance   Gait / Transfers Assistance Needed: pt/dtr report pt can manage to transfer bed to w/c or back without assist and use of a RW.  There are also times pt needs minimal assist  ADL's / Homemaking Assistance Needed: pt is assisted with sponge baths        Hand Dominance   Dominant Hand: Right    Extremity/Trunk Assessment        Lower Extremity Assessment Lower Extremity Assessment: RLE deficits/detail;LLE deficits/detail RLE Deficits / Details: R foot drop with contracture ~25+* df lag from neutral.  Mildly weak overall at 4-/5 RLE Coordination: decreased fine motor LLE Deficits / Details: grossly WFL at >=4/5, mild proximal weakness  bil.       Communication   Communication: Expressive difficulties  Cognition Arousal/Alertness: Awake/alert Behavior During Therapy: WFL for tasks assessed/performed Overall Cognitive Status: Impaired/Different from baseline Area of Impairment: Attention;Safety/judgement                   Current Attention Level: Selective     Safety/Judgement: Decreased awareness of safety             General Comments General comments (skin integrity, edema, etc.): pt is interested in getting her right foot down to floor in stance.   Pt with contracture R foot as a result of long term foot drop with poor positioning in the past.  Not ready yet for AFO.    Exercises     Assessment/Plan    PT Assessment Patient needs continued PT services  PT Problem List Decreased strength;Decreased activity tolerance;Decreased balance;Decreased mobility;Decreased coordination;Decreased knowledge of use of DME       PT Treatment Interventions DME instruction;Functional mobility training;Therapeutic activities;Therapeutic exercise;Balance training;Patient/family education    PT Goals (Current goals can be found in the Care Plan section)  Acute Rehab PT Goals Patient Stated Goal: To be able to walk again PT Goal Formulation: With patient Time For Goal Achievement: 11/14/16 Potential to Achieve Goals: Good    Frequency Min 3X/week   Barriers to discharge        Co-evaluation               AM-PAC PT "6 Clicks" Daily Activity  Outcome Measure Difficulty turning over in bed (including adjusting bedclothes, sheets and blankets)?: A Little Difficulty moving from lying on back to sitting on the side of the bed? : A Little Difficulty sitting down on and standing up from a chair with arms (e.g., wheelchair, bedside commode, etc,.)?: A Little Help needed moving to and from a bed to chair (including a wheelchair)?: A Little Help needed walking in hospital room?: A Lot Help needed climbing 3-5 steps with a railing? : A Lot 6 Click Score: 16    End of Session   Activity Tolerance: Patient tolerated treatment well Patient left: in chair;with call bell/phone within reach;with chair alarm set;with family/visitor present Nurse Communication: Mobility status PT Visit Diagnosis: Muscle weakness (generalized) (M62.81);Other abnormalities of gait and mobility (R26.89)    Time: 5885-0277 PT  Time Calculation (min) (ACUTE ONLY): 33 min   Charges:   PT Evaluation $PT Eval Moderate Complexity: 1 Procedure PT Treatments $Therapeutic Activity: 8-22 mins   PT G Codes:        11-01-2016  Donnella Sham, PT [phone]  (pager)  Tessie Fass Raydell Maners 01-Nov-2016, 2:33 PM

## 2016-11-01 ENCOUNTER — Other Ambulatory Visit (HOSPITAL_COMMUNITY): Payer: Medicare Other

## 2016-11-01 DIAGNOSIS — Z7189 Other specified counseling: Secondary | ICD-10-CM

## 2016-11-01 DIAGNOSIS — C3490 Malignant neoplasm of unspecified part of unspecified bronchus or lung: Secondary | ICD-10-CM

## 2016-11-01 DIAGNOSIS — Z515 Encounter for palliative care: Secondary | ICD-10-CM

## 2016-11-01 LAB — HEMOGLOBIN A1C
Hgb A1c MFr Bld: 5.3 % (ref 4.8–5.6)
Mean Plasma Glucose: 105 mg/dL

## 2016-11-01 LAB — CBC
HEMATOCRIT: 29.4 % — AB (ref 36.0–46.0)
Hemoglobin: 9.2 g/dL — ABNORMAL LOW (ref 12.0–15.0)
MCH: 27.6 pg (ref 26.0–34.0)
MCHC: 31.3 g/dL (ref 30.0–36.0)
MCV: 88.3 fL (ref 78.0–100.0)
PLATELETS: 161 10*3/uL (ref 150–400)
RBC: 3.33 MIL/uL — AB (ref 3.87–5.11)
RDW: 14.2 % (ref 11.5–15.5)
WBC: 3.5 10*3/uL — AB (ref 4.0–10.5)

## 2016-11-01 LAB — BASIC METABOLIC PANEL
ANION GAP: 9 (ref 5–15)
BUN: 50 mg/dL — ABNORMAL HIGH (ref 6–20)
CALCIUM: 8.3 mg/dL — AB (ref 8.9–10.3)
CO2: 18 mmol/L — AB (ref 22–32)
Chloride: 114 mmol/L — ABNORMAL HIGH (ref 101–111)
Creatinine, Ser: 3.79 mg/dL — ABNORMAL HIGH (ref 0.44–1.00)
GFR, EST AFRICAN AMERICAN: 12 mL/min — AB (ref >60)
GFR, EST NON AFRICAN AMERICAN: 10 mL/min — AB (ref >60)
GLUCOSE: 78 mg/dL (ref 65–99)
POTASSIUM: 4.5 mmol/L (ref 3.5–5.1)
Sodium: 141 mmol/L (ref 135–145)

## 2016-11-01 LAB — T4, FREE: Free T4: 1.06 ng/dL (ref 0.61–1.12)

## 2016-11-01 NOTE — Discharge Summary (Signed)
[facility] Discharge Summary  Patient name: [name] Medical record number: [id number] Date of birth: [birth year] Age: 83 y.o. Gender: female Date of Admission: 10/30/2016  Date of Discharge: 11/04/16  Admitting Physician: Alveda Reasons, MD  Primary Care Provider: Celene Squibb, MD Consultants: Neurology, Palliative, SLP, PT, OT  Indication for Hospitalization: Aphasia  Discharge Diagnoses/Problem List:  Aphasia Stroke Stage IV lung cancer with mets to bone Atrial fibrillation History of multiple DVT's HLD CKD III  CHF  Disposition: home with [facility] PT/OT/SLP  Discharge Condition: Stable   Discharge Exam:   Gen: elderly lady sitting up in chair in NAD Heart: RRR no MRG Lungs: CTA bilaterally, no increased work of breathing Abdomen: soft, non-tender, non-distended, +BS Extremities: no edema or cyanosis, +2 dorsalis pedis pulse bilaterally Neuro: no focal deficits  Brief Hospital Course:  [name] is a 83 y.o. female presenting with stroke. PMH is significant for history of stage IV lung cancer currently on chemotherapy, atrial fibrillation not on A/C, h/o multiple DVTs,  HLD, CKD III, and CHF.   She experienced symptoms of aphasia while at chemo center at [facility] and was sent for outpatient MRI which showed multiple strokes, namely cortical and subcortical infarct involving the anterior left frontal lobe along the middle frontal gyrus, cortical infarct involving the left parietal lobe, and potentially along the watershed territory.  She was not given TPA as she was outside the window.  On exam, with difficulty with word finding but neuro exam otherwise unremarkable.  She was started on Plavix 75 mg in the ED and stroke workup was performed.  LE dopplers ruled out DVT and carotid dopplers negative.   Echo showed EF of 40-45%, moderate aortic stenosis and no signs of emoblus. Patient was started on Eliquis at 2.5 mg BID due to kidney function  and age to prevent future clotting due to afib.   At time of discharge she was stable.  PT/OT/SLP recommended home health. Palliative was consulted and felt patient was well connected with her home resources. She was discharged home to care of her daughter with [facility] services.   Issues for Follow Up:  1. Monitor for bleeding closely on Eliquis as patient had adverse bleeding with Xarelto in the past. 2. Follow up as scheduled with oncology to monitor and treat lung cancer  Significant Procedures: none  Significant Labs and Imaging:   Recent Labs Lab 10/31/16 1120 11/01/16 0450 11/03/16 0426  WBC 4.1 3.5* 3.9*  HGB 10.1* 9.2* 9.4*  HCT 32.1* 29.4* 30.1*  PLT 183 161 169    Recent Labs Lab 10/29/16 0823  10/30/16 1414 10/30/16 1435 10/31/16 1120 11/01/16 0450 11/03/16 0426  NA 141  --  136 138 137 141 137  K 4.8  < > 4.9 5.0 4.5 4.5 4.2  CL  --   --  109 108 110 114* 113*  CO2 17*  --  17*  --  18* 18* 19*  GLUCOSE 112  --  96 90 211* 78 83  BUN 64.2*  --  58* 63* 54* 50* 45*  CREATININE 3.8*  --  3.74* 3.90* 3.82* 3.79* 3.63*  CALCIUM 8.9  --  8.6*  --  8.1* 8.3* 8.2*  ALKPHOS 130  --  118  --   --   --   --   AST 11  --  15  --   --   --   --   ALT 6  --  8*  --   --   --   --   ALBUMIN 3.2*  --  3.2*  --   --   --   --   < > = values in this interval not displayed.  Echo 11/02/16 Study Conclusions  - Left ventricle: The cavity size was normal. Wall thickness was   increased in a pattern of mild LVH. Systolic function was mildly   to moderately reduced. The estimated ejection fraction was in the   range of 40% to 45%. Doppler parameters are consistent with   abnormal left ventricular relaxation (grade 1 diastolic   dysfunction). - Regional wall motion abnormality: Akinesis of the mid inferior   and mid inferolateral myocardium. - Aortic valve: Morphologically, there appears to be mild to   moderate aortic stenosis. Peak velocities and mean gradients   appear to be  underestimated. Moderately calcified leaflets. Cusp   separation was reduced. There was moderate regurgitation. Peak   velocity (S): 192 cm/s. Mean gradient (S): 7 mm Hg. Valve area   (VTI): 1.29 cm^2. Valve area (Vmax): 1.41 cm^2. Valve area   (Vmean): 1.45 cm^2. - Mitral valve: Calcified annulus. There was moderate   regurgitation. - Left atrium: The atrium was mildly dilated. - Right ventricle: Systolic function was mildly reduced. Lateral   annulus peak S velocity: 9.41 cm/s.   Results/Tests Pending at Time of Discharge: none  Discharge Medications:  Allergies as of 11/04/2016      Reactions   Amlodipine Swelling   Ciprofloxacin Hives, Other (See Comments)   REACTION: weakness   Mirtazapine Other (See Comments)   Hallucinations and nightmares, verbally aggressive    Statins Other (See Comments)   Unknown to patient   Aspirin Swelling   Mouth swelling & tongue Patient reports that she tolerates ibuprofen without a problem   Cephalexin Hives   Hydralazine Nausea And Vomiting   Iron Nausea And Vomiting   Ambien [zolpidem Tartrate] Other (See Comments)   Confusion   Lorazepam Other (See Comments)   Hallucinations, verbally aggressive   Sulfonamide Derivatives Hives   Zolpidem    Confusion   Latex Rash   Penicillins Other (See Comments)   From childhood: Has patient had a PCN reaction causing immediate rash, facial/tongue/throat swelling, SOB or lightheadedness with hypotension: Yes Has patient had a PCN reaction causing severe rash involving mucus membranes or skin necrosis: Yes Has patient had a PCN reaction that required hospitalization No Has patient had a PCN reaction occurring within the last 10 years: No If all of the above answers are "NO", then may proceed with Cephalosporin use.   Shellfish Allergy Hives, Rash   Tape Rash   MUST USE PAPER TAPE!!!! SKIN IS VERY THIN AND WILL TEAR EASILY!!      Medication List    TAKE these medications   acetaminophen 500 MG  tablet Commonly known as:  TYLENOL Take 500 mg by mouth daily as needed for mild pain.   apixaban 2.5 MG Tabs tablet Commonly known as:  ELIQUIS Take 1 tablet (2.5 mg total) by mouth 2 (two) times daily.   diazepam 2 MG tablet Commonly known as:  VALIUM Take 1 tablet (2 mg total) by mouth See admin instructions. Take 1 tab 1 hour prior to MRI. May repeat X1 after 1 hour if not effective.   diphenoxylate-atropine 2.5-0.025 MG tablet Commonly known as:  LOMOTIL Take 1 tablet by mouth 4 (four) times daily as needed for diarrhea or loose stools. Reported on  11/28/2015   furosemide 40 MG tablet Commonly known as:  LASIX Take 40 mg by mouth daily as needed for fluid or edema.   KEYTRUDA IV Inject into the vein every 21 ( twenty-one) days.   loperamide 2 MG capsule Commonly known as:  IMODIUM Take 1 capsule (2 mg total) by mouth every 6 (six) hours as needed for diarrhea or loose stools.   metoprolol tartrate 25 MG tablet Commonly known as:  LOPRESSOR Take 0.5 tablets (12.5 mg total) by mouth at bedtime. What changed:  how much to take  when to take this  additional instructions   pantoprazole 40 MG tablet Commonly known as:  PROTONIX Take 40 mg by mouth daily before breakfast.   senna-docusate 8.6-50 MG tablet Commonly known as:  Senokot-S Take 1 tablet by mouth at bedtime as needed for mild constipation.       Discharge Instructions: Please refer to Patient Instructions section of EMR for full details.  Patient was counseled important signs and symptoms that should prompt return to medical care, changes in medications, dietary instructions, activity restrictions, and follow up appointments.   Follow-Up Appointments: Follow-up Information    Celene Squibb, MD. Schedule an appointment as soon as possible for a visit.   Specialty:  Internal Medicine Contact information: [place] [address] [place], [place],  DO 11/04/2016, 9:10 AM PGY-1, [place]

## 2016-11-01 NOTE — Progress Notes (Signed)
New referral for home PALLIATIVE services received from Palliative Medicine PA Imogene Burn. Referral made aware. Thank you. Flo Shanks RN, BSN, [facility] [place], hospital Liaison [phone] c

## 2016-11-01 NOTE — Progress Notes (Signed)
Spoke with nurse Nira Conn at [facility] regarding foley catheter. She reports patient's last visit with them was 04/02/16. Note mentioned history of urethral stricture, malignant hydronephrosis and malignancy to pelvis but no plan for duration of in-dwelling foley.

## 2016-11-01 NOTE — Progress Notes (Signed)
STROKE TEAM PROGRESS NOTE   SUBJECTIVE (INTERVAL HISTORY) Patient has multiple family members at the bedside. She states her speech has returned back to baseline. She does have a history of newly diagnosed atrial fibrillation as well as lung cancer   OBJECTIVE Temp:  [97.3 F (36.3 C)-98.4 F (36.9 C)] 98.4 F (36.9 C) (06/01 0516) Pulse Rate:  [74-85] 78 (06/01 0516) Cardiac Rhythm: Heart block (06/01 0700) Resp:  [18-20] 18 (06/01 0516) BP: (138-162)/(59-74) 146/59 (06/01 0516) SpO2:  [97 %-98 %] 97 % (06/01 0516)  CBC:  Recent Labs Lab 10/29/16 0823  10/30/16 1414  10/31/16 1120 11/01/16 0450  WBC 4.5  < > 4.2  --  4.1 3.5*  NEUTROABS 3.1  --  2.6  --   --   --   HGB 10.7*  --  10.5*  < > 10.1* 9.2*  HCT 32.5*  --  34.0*  < > 32.1* 29.4*  MCV 87.3  < > 89.2  --  88.7 88.3  PLT 206  < > 204  --  183 161  < > = values in this interval not displayed.  Basic Metabolic Panel:  Recent Labs Lab 10/31/16 1120 11/01/16 0450  NA 137 141  K 4.5 4.5  CL 110 114*  CO2 18* 18*  GLUCOSE 211* 78  BUN 54* 50*  CREATININE 3.82* 3.79*  CALCIUM 8.1* 8.3*    PHYSICAL EXAM Frail pleasant elderly Caucasian lady sitting comfortably in a bedside chair. Not in distress. . Afebrile. Head is nontraumatic. Neck is supple without bruit.    Cardiac exam no murmur or gallop. Lungs are clear to auscultation. Distal pulses are well felt. Neurological Exam :    Awake  Alert oriented x 3. Normal speech and language.eye movements full without nystagmus.fundi were not visualized. Vision acuity and fields appear normal. Hearing is normal. Palatal movements are normal. Face symmetric. Tongue midline. Normal strength, tone, reflexes and coordination except mild weakness of right ankle dorsiflexors and plantar flexors 4/5.Marland Kitchen Normal sensation. Gait deferred.  ASSESSMENT/PLAN Ms. AFUA HOOTS is a 83 y.o. female with history of stage IV lung cancer currently on chemotherapy, atrial fibrillation not  on A/C, h/o multiple DVTs, HLD, CKD III, and CHF who developed confusion and speech changes 5/30 am. MRI with multiple strokes and UA positive for UTI. She did not receive IV t-PA due to being out of the tPA window.   Stroke:  left frontal and parietal, cortical and subcortical infarcts embolic secondary to known atrial fibrillation source, not on AC ? reason  Resultant  no deficits  MRI  Acute and subacute, cortical and subcortical infarcts L frontal, L parietal infarcts. Small vessel disease. Atrophy. Old B BG and cerebellar infarcts.  MRA  (not ordered)  Carotid Doppler  B ICA 1-39% stenosis, VAs antegrade   2D Echo  pending    LE doppler negative   LDL 129  HgbA1c 5.3  Lovenox 30 mg sq daily for VTE prophylaxis  Diet regular Room service appropriate? Yes; Fluid consistency: Thin  No antithrombotic prior to admission, now on clopidogrel 75 mg daily (allergic to ASA)  Therapy recommendations:  HH OT, PT, 3N1  Disposition:  Return home with therapies  Atrial Fibrillation  Home anticoagulation:  none   Not an AC candidate d/t  ??    Hypertension  Stable Permissive hypertension (OK if < 220/120) but gradually normalize in 5-7 days Long-term BP goal normotensive  Hyperlipidemia  Home meds:  No statin  LDL 129,  above goal  Add statintin  Other Stroke Risk Factors  Advanced age  51 / ETOH level not performed   Former Cigarette smoker  Coronary artery disease  Hx multiple DVTs  Chronic diastolic CHF  Other Active Problems  Stage IV non small lung cancer w/ bone mets  Acute on chronic kidney diease stage IV  Constipation  Abnormal UA  Chronic foley catheter  Hospital day # 2  I have personally examined this patient, reviewed notes, independently viewed imaging studies, participated in medical decision making and plan of care.ROS completed by me personally and pertinent positives fully documented  I have made any additions or clarifications  directly to the above note. She presented with transient speech and word finding difficulties secondary to embolic left MCA infarct etiology indeterminate but likely related to either atrial fibrillation or hypercoagulability from, lung cancer. The patient has known prior history of multiple DVTs and am unclear as to why she is not already on anticoagulation prior to admission. Add statin for elevated LDL.Discussed with family practice medical resident and answered questions. Greater than 50% time during this 25 minute visit was spent on counseling and coordination of care about an embolic stroke, lung cancer related hypercoagulopathy and answered questions  Antony Contras, MD Medical Director [place] Pager: [phone] 11/01/2016 4:02 PM   To contact Stroke Continuity provider, please refer to [email or web address] After hours, contact General Neurology

## 2016-11-01 NOTE — Progress Notes (Signed)
Physical Therapy Treatment Patient Details Name: [name] MRN: [id number] DOB: 04-14-1934 Today's Date: 11/01/2016    History of Present Illness 83 Y/O F with PMX of Lung CA, HTN, Ischemic cardiomyopathy,HLD, GERD,DVT,CKD stage IV, Afib, chronic anemia presented with hx of difficulty with her speech MRI + L frontal/parietal infarct.    PT Comments    Emphasis on standing for heel cord stretch, pregait and transfers.   Follow Up Recommendations  Home health PT;Supervision for mobility/OOB     Equipment Recommendations  None recommended by PT    Recommendations for Other Services       Precautions / Restrictions Precautions Precautions: Fall    Mobility  Bed Mobility Overal bed mobility: Needs Assistance Bed Mobility: Supine to Sit     Supine to sit: Min guard (minimal HOB up)     General bed mobility comments: no rail needed,  no assist needed  Transfers Overall transfer level: Needs assistance Equipment used: Rolling walker (2 wheeled) Transfers: Sit to/from [facility] Sit to Stand: Min assist;Mod assist (variable with technique) Stand pivot transfers: Min assist;Mod assist       General transfer comment: bed to bsc to bed to chair with min to mod assist variable due to different direction and technique.  Ambulation/Gait             General Gait Details: pivot transfers only today   Stairs            Wheelchair Mobility    Modified Rankin (Stroke Patients Only) Modified Rankin (Stroke Patients Only) Pre-Morbid Rankin Score: Slight disability Modified Rankin: Moderately severe disability     Balance     Sitting balance-Leahy Scale: Good       Standing balance-Leahy Scale: Poor Standing balance comment: reliant on the RW for stability  stood x2 for ~2 min working on R heel cord stretching.  Pt's heel still an inch off the floor or 20-25* to neutral.                            Cognition  Arousal/Alertness: Awake/alert Behavior During Therapy: WFL for tasks assessed/performed Overall Cognitive Status: Impaired/Different from baseline (but noticeable improvement)                               Problem Solving: Slow processing        Exercises      General Comments        Pertinent Vitals/Pain      Home Living                      Prior Function            PT Goals (current goals can now be found in the care plan section) Acute Rehab PT Goals PT Goal Formulation: With patient Time For Goal Achievement: 11/14/16 Potential to Achieve Goals: Good Progress towards PT goals: Progressing toward goals    Frequency    Min 3X/week      PT Plan Current plan remains appropriate    Co-evaluation              AM-PAC PT "6 Clicks" Daily Activity  Outcome Measure  Difficulty turning over in bed (including adjusting bedclothes, sheets and blankets)?: A Little Difficulty moving from lying on back to sitting on the side of the bed? : A Little Difficulty sitting down  on and standing up from a chair with arms (e.g., wheelchair, bedside commode, etc,.)?: A Little Help needed moving to and from a bed to chair (including a wheelchair)?: A Little Help needed walking in hospital room?: A Lot Help needed climbing 3-5 steps with a railing? : A Lot 6 Click Score: 16    End of Session   Activity Tolerance: Patient tolerated treatment well Patient left: in chair;with call bell/phone within reach;with chair alarm set Nurse Communication: Mobility status PT Visit Diagnosis: Muscle weakness (generalized) (M62.81);Other abnormalities of gait and mobility (R26.89)     Time: 1725-1800 PT Time Calculation (min) (ACUTE ONLY): 35 min  Charges:  $Therapeutic Activity: 23-37 mins                    G Codes:       11-10-16  [name], PT [phone]  (pager)   [name] 11-10-16, 6:22 PM

## 2016-11-01 NOTE — Progress Notes (Signed)
Family Medicine Teaching Service Daily Progress Note Intern Pager: [phone]  Patient name: [name] Medical record number: [id number] Date of birth: [birth year] Age: 83 y.o. Gender: female  Primary Care Provider: Celene Squibb, MD Consultants: Neurology  Code Status: FULL   Pt Overview and Major Events to Date:  Admit 5/30   Assessment and Plan: [name] is a 83 y.o. female presenting with stroke. PMH is significant for history of stage IV lung cancer currently on chemotherapy, atrial fibrillation not on A/C, h/o multiple DVTs,  HLD, CKD III, and CHF.     Aphasia 2/2 multiple left hemispheric strokes, likely embolic:  Improving. -Neurology consulted, appreciate recs  -ECHO pending.  If negative, would recommend TEE.  -continue Plavix 75 mg daily  -Fall precautions  -PT/OT and SLP recommends home health PT/OT/SLP with intermittent supervision  H/o Stage IV lung CA with mets to bone. Currently on chemotherapy. Follows outpatient with Oncology. Per daughter has had poor po intake while on chemotherapy but is otherwise well. -palliative consulted  Hx of CAD: Hx of CAD s/p stenting in 2007.   - Holding Metoprolol for now for permissive htn  HFpEF: Last Echo 06/2015 with EF 50-55% G1DD with enhanced echogenicity, thinning and akinesis of the inferior wallconsistent with prior infarct.  On exam no evidence of fluid overload and lung exam without crackles noted.  She denies shortness of breath on exertion.  At home on 40 mg Lasix as needed for edema and Lopressor.  -Will hold Lasix and Lopressor for now  -Echo as above  -I's and O's  -daily weights   Hx of Afib: In NSR on exam. Not currently on anticoagulation. - Holding metoprolol - continue plavix  H/o multiple DVT's. Stable.  Patient with no calf tenderness or warmth noted on exam.   -Vas US dopplers negative -continue to monitor  ?Acute on CKD Stage IV.  - stable. SCr of 3.79.  BL appears ~3.1-3.6.  Follows  outpatient with Nephrology. No uremia. Electrolytes stable.  -Daily BMET  -Avoid nephrotoxic agents -Consider inpatient nephro referral if Cr trends upwards  Constipation- improved, BM this morning -Will add bowel regimen   Abnormal UA: Moderate bacteria, large blood, large LE, and + Nitrites on UA yesterday. Patient afebrile and asymptomatic. Has chronic foley in. S/p 1 dose of antibiotic (unsure which one this was) - Will hold off on abx at this time as patient is afebrile and asymptomatic and we have a reason for her change in mental status. Additionally, has a chronic foley in.  Chronic Foley catheter: Patient has had foley in for 1 year. Unclear why. Is changed on the 1st of every month.  - Will order for foley to be changed  FEN/GI: Regular diet, IVF @ [facility] Prophylaxis: Lovenox   Disposition: home with HH PT/OT/SLP  Subjective:  Patient is feeling well this morning, she is waiting for her daughter to come home. She states she has good support at home with home health nurses and PT. Daughter lives nearby.   Objective: Temp:  [97.3 F (36.3 C)-98.4 F (36.9 C)] 98.4 F (36.9 C) (06/01 0516) Pulse Rate:  [74-85] 78 (06/01 0516) Resp:  [18-20] 18 (06/01 0516) BP: (138-162)/(59-74) 146/59 (06/01 0516) SpO2:  [97 %-98 %] 97 % (06/01 0516) Physical Exam: Gen: elderly lady sitting up in bedside chair in NAD Heart: RRR no MRG Lungs: CTA bilaterally, no increased work of breathing Extremities: no edema or cyanosis, +2 dorsalis pedis pulse bilaterally Neuro: no focal  deficits, speech normal  Laboratory:  Recent Labs Lab 10/30/16 1414 10/30/16 1435 10/31/16 1120 11/01/16 0450  WBC 4.2  --  4.1 3.5*  HGB 10.5* 10.9* 10.1* 9.2*  HCT 34.0* 32.0* 32.1* 29.4*  PLT 204  --  183 161    Recent Labs Lab 10/29/16 0823  10/30/16 1414 10/30/16 1435 10/31/16 1120 11/01/16 0450  NA 141  < > 136 138 137 141  K 4.8  < > 4.9 5.0 4.5 4.5  CL  --   < > 109 108 110 114*  CO2  17*  --  17*  --  18* 18*  BUN 64.2*  < > 58* 63* 54* 50*  CREATININE 3.8*  < > 3.74* 3.90* 3.82* 3.79*  CALCIUM 8.9  --  8.6*  --  8.1* 8.3*  PROT 7.1  --  7.4  --   --   --   BILITOT 0.57  --  0.6  --   --   --   ALKPHOS 130  --  118  --   --   --   ALT 6  --  8*  --   --   --   AST 11  --  15  --   --   --   GLUCOSE 112  < > 96 90 211* 78  < > = values in this interval not displayed.   TSH elevated to 5.020, T4 normal at 1.06, A1c 5.3, lipid panel with LDL 129.   Imaging/Diagnostic Tests: No results found.   LE doppler Summary: No evidence of deep vein or superficial thrombosis involving the right lower extremity and left lower extremity.  Carotid US: Summary: Bilateral: mild mixed plaque origin ICA. Tortoisity noted bilaterally. 1-39% ICA stenosis. Vertebral artery flow is antegrade.  Steve Rattler, DO 11/01/2016, 9:48 AM PGY-1, [facility] Intern pager: [phone], text pages welcome

## 2016-11-01 NOTE — Consult Note (Signed)
Consultation Note Date: 11/01/2016   Patient Name: [name]  DOB: 02-26-1934  MRN: [id number]  Age / Sex: 83 y.o., female  PCP: Celene Squibb, MD Referring Physician: No att. providers found  Reason for Consultation: Establishing goals of care   "Do NOT give my mother a prognosis and Do NOT talk with her about Hospice."   HPI/Patient Profile: 83 y.o. female  with past medical history of [place] originally diagnosed in 2007 (treated by Dr. Julien Nordmann), chronic kidney failure stage 4 (baseline creatinine of 3.5 for the past 2 years), afib, ischemic cardiomyopathy, diastolic heart failure, and multiple DVTs (not on anticoagulation?) who was admitted on 10/30/2016 with multiple small strokes and resulting aphasia.   Clinical Assessment and Goals of Care: As I agreed not to give prognosis or talk about Hospice with the patient, I was welcomed by [name] and her husband to speak with their mother, [name]. I gathered report from Dr. Julien Nordmann who commented "She always bounces back".  He described that  she has been in terrible shape with her back (I gathered she was close to dying in 2016) and she bounced back.   [name] is delightful, coherent, intelligent and has a very strong will to live.     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  She raised her two daughters.  [name] is her POA and lives up the street from her.  The other daughter lives else where and is not involved.  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  Even though she was in her 23s with cancer - she also worked taking care of the elderly.  She saw 3 of her patients thru to there death at [facility].   Her distrust of Hospice started there as, in her view, her patients were over medicated until they passed.  Consequently if you discuss Hospice House care for her - [name] will ask  you to leave the room. We discussed [place] care - and she didn't kick me out - but was not receptive.  However - She is currently feeling very well and her aphasia is improving each day.  Today she speaks clearly but has a small amount of word finding difficulty - she turns to her daughter when she is trying to find a word.  Mrs Weedon tells me that when her time comes (its not here yet) she wants to die in her home (or her daughter's home) peacefully with her family.  She does not want to be at [facility].  She never wants to go to SNF.  Her daughter [name] is working hard to prevent SNF placement.     The current plan is for [name] to move in with [facility] and her husband at discharge. She will received PT/OT/SLP there. (Currently she uses [facility]).  [name] also has a private aid an hour a day.   The family has a really  good arrangement for her.  After speaking at length with [name] about her life, her religious beliefs (God is #`1 and Dr. Julien Nordmann - oncologist is #2) - "God is our Nutritional therapist and our Taker".  She agreed to Palliative services at home and I went to speak privately with her daughter [name].  [name] and I talked about code status.  [name] elected full code on her own at admission. [name] feels that her mother does not understand what a code would do to her body.  We discussed protecting [name] and enabling a peaceful death with a DNR outside the hospital and at least a partial code inside the hospital.  [name] agreed to read the [place] and the Living Will Paper work and discuss them with her mother.     I also counseled [name] that when the time comes to take hospice services - She just needs to voice her opinions about medications and Hospice will work with her. For example, the patient can not take ambien or ativan - they cause delusions, but seems to do well with valium.  For now the patient will continue immunotherapy  with Dr. Julien Nordmann.  She trusts him implicitly.  I imagine she will continue therapy until he advises her to stop.    If an outpatient Palliative provider first established a friendly relationship with [name] they could gently educate her and change her code status.  It may just take a visit to establish a relationship first.  Further I feel they could follow her and gently work in hospice home services when Dr. Julien Nordmann says it is appropriate.  Primary Decision Maker:  PATIENT and dtr [name].    SUMMARY OF RECOMMENDATIONS    Outpatient Palliative follow up with [facility]. - to provide supportive listening, education regarding code status and living will.  Patient will continue immuno-therapy.   Patient will d/c to daughter's house with PT / OT / speech follow up.  Dtr requests help if possible with providing a ramp to get her mother in and out of the house.  Mrs.  [name] wants to keep moving and helping others as long as possible.  Code Status/Advance Care Planning:  Full code    Symptom Management:   Non - no pain.  Monitor for constipation.   Psycho-social/Spiritual:  Desire for further Chaplaincy support:  Yes  Prognosis:   Unable to determine - This precious woman defies all expectations  Discharge Planning: Home with [facility] and Palliative Care.      Primary Diagnoses: Present on Admission: . Acute CVA (cerebrovascular accident) ([place])   I have reviewed the medical record, interviewed the patient and family, and examined the patient. The following aspects are pertinent.  Past Medical History:  Diagnosis Date  . A-fib ([place])   . Chronic anemia   . Chronic diastolic (congestive) heart failure ([place])   . Chronic fatigue 04/04/2015  . Chronic fatigue 04/04/2015  . CKD (chronic kidney disease)   . Coronary artery disease    a.  LHC (06/04/05): LHC done in [place] with high grade RCA => s/p BMS to RCA;  b.  Nuclear  (09/14/09): Lexiscan; Inf infarct with mild peri-infarct ishemia, EF 52%; Low Risk.  Marland Kitchen DVT (deep venous thrombosis) ([place])   . Foot drop, right   . GERD (gastroesophageal reflux disease)   . Goals of care, counseling/discussion 09/30/2016  . Hypercholesterolemia   . Hypertension   . Ischemic cardiomyopathy  a. Echo (07/26/13): Mild LVH, EF 35-40%, diff HK, inf AK, Gr 2 DD, Tr AI, mildly dilated Ao root, MAC, mild MR, mild LAE, mod reduced RVSF.  . Non-small cell carcinoma of lung ([place])    Stage IV;spinal cancer; had chemo and radiation and immune therapy   Social History   Social History  . Marital status: Widowed    Spouse name: N/A  . Number of children: N/A  . Years of education: N/A   Social History Main Topics  . Smoking status: Former Smoker    Packs/day: 1.00    Years: 35.00    Types: Cigarettes    Quit date: 12/07/1990  . Smokeless tobacco: Never Used  . Alcohol use No  . Drug use: No  . Sexual activity: No   Other Topics Concern  . None   Social History Narrative  . None   Family History  Problem Relation Age of Onset  . Heart failure Mother   . Lung cancer Sister   . Lung cancer Brother   . Stomach cancer Brother   . Heart attack Neg Hx   . Stroke Neg Hx   . Colon cancer Neg Hx    Scheduled Meds: .  stroke: mapping our early stages of recovery book   Does not apply Once  . clopidogrel  75 mg Oral Daily  . enoxaparin (LOVENOX) injection  30 mg Subcutaneous Q24H  . pantoprazole  40 mg Oral QHS  . sodium chloride flush  10-40 mL Intracatheter Q12H   Continuous Infusions: . sodium chloride 10 mL/hr at 11/01/16 1110   PRN Meds:.acetaminophen **OR** acetaminophen (TYLENOL) oral liquid 160 mg/5 mL **OR** acetaminophen, senna-docusate Allergies  Allergen Reactions  . Amlodipine Swelling  . Ciprofloxacin Hives and Other (See Comments)    REACTION: weakness  . Mirtazapine Other (See Comments)    Hallucinations and nightmares, verbally aggressive   .  Statins Other (See Comments)    Unknown to patient  . Aspirin Swelling    Mouth swelling & tongue Patient reports that she tolerates ibuprofen without a problem  . Cephalexin Hives  . Hydralazine Nausea And Vomiting  . Iron Nausea And Vomiting  . Ambien [Zolpidem Tartrate] Other (See Comments)    Confusion   . Lorazepam Other (See Comments)    Hallucinations, verbally aggressive  . Sulfonamide Derivatives Hives  . Zolpidem     Confusion  . Latex Rash  . Penicillins Other (See Comments)    From childhood: Has patient had a PCN reaction causing immediate rash, facial/tongue/throat swelling, SOB or lightheadedness with hypotension: Yes Has patient had a PCN reaction causing severe rash involving mucus membranes or skin necrosis: Yes Has patient had a PCN reaction that required hospitalization No Has patient had a PCN reaction occurring within the last 10 years: No If all of the above answers are "NO", then may proceed with Cephalosporin use.   . Shellfish Allergy Hives and Rash  . Tape Rash    MUST USE PAPER TAPE!!!! SKIN IS VERY THIN AND WILL TEAR EASILY!!   Review of Systems  Some word finding difficulty.  Weight loss (180 - 120)  Physical Exam  Elderly very frail female, sitting up in the chair looking chipper.  Some word finding difficulty but A&O, NAD, cooperative, friendly. CV rrr Resp no distress Ext able to move all four  Vital Signs: BP (!) 153/67 (BP Location: Left Arm)   Pulse 87   Temp 99.4 F (37.4 C) (Oral)   Resp 20  Ht 5' 8"  (1.727 m)   Wt 55.9 kg (123 lb 4.8 oz)   SpO2 98%   BMI 18.75 kg/m  Pain Assessment: No/denies pain   Pain Score: 0-No pain   SpO2: SpO2: 98 % O2 Device:SpO2: 98 % O2 Flow Rate: .   IO: Intake/output summary:  Intake/Output Summary (Last 24 hours) at 11/01/16 1116 Last data filed at 11/01/16 0516  Gross per 24 hour  Intake           293.33 ml  Output              750 ml  Net          -456.67 ml    LBM: Last BM Date:  10/26/16 Baseline Weight: Weight: 55.9 kg (123 lb 4.8 oz) Most recent weight: Weight: 55.9 kg (123 lb 4.8 oz)     Palliative Assessment/Data:   Flowsheet Rows     Most Recent Value  Intake Tab  Referral Department  -- [family medicine]  Unit at Time of Referral  Cardiac/Telemetry Unit  Palliative Care Primary Diagnosis  Other (Comment)  Date Notified  10/31/16  Palliative Care Type  New Palliative care  Reason for referral  Clarify Goals of Care  Date of Admission  10/30/16  Date first seen by Palliative Care  11/01/16  # of days Palliative referral response time  1 Day(s)  # of days IP prior to Palliative referral  1  Clinical Assessment  Palliative Performance Scale Score  30%  Psychosocial & Spiritual Assessment  Palliative Care Outcomes  Patient/Family meeting held?  Yes  Who was at the meeting?  patient, dtr [name]) and son in law  Palliative Care Outcomes  Clarified goals of care      Time In: 9:00 Time Out: 10:10 Time Total: 70 min. Greater than 50%  of this time was spent counseling and coordinating care related to the above assessment and plan.  Signed by: Imogene Burn, PA-C Palliative Medicine Pager: [phone]  Please contact Palliative Medicine Team phone at [phone] for questions and concerns.  For individual provider: See Shea Evans

## 2016-11-02 ENCOUNTER — Inpatient Hospital Stay (HOSPITAL_COMMUNITY): Payer: Medicare Other

## 2016-11-02 DIAGNOSIS — I5030 Unspecified diastolic (congestive) heart failure: Secondary | ICD-10-CM

## 2016-11-02 LAB — ECHOCARDIOGRAM COMPLETE
Height: 68 in
Weight: 1972.8 oz

## 2016-11-02 MED ORDER — METOPROLOL TARTRATE 12.5 MG HALF TABLET
12.5000 mg | ORAL_TABLET | Freq: Every day | ORAL | Status: DC
  Administered 2016-11-02 – 2016-11-03 (×2): 12.5 mg via ORAL
  Filled 2016-11-02 (×2): qty 1

## 2016-11-02 NOTE — Progress Notes (Signed)
Family Medicine Teaching Service Daily Progress Note Intern Pager: [phone]  Patient name: [name] Medical record number: [id number] Date of birth: [birth year] Age: 83 y.o. Gender: female  Primary Care Provider: Celene Squibb, MD Consultants: Neurology, palliative  Code Status: FULL   Pt Overview and Major Events to Date:  Admit 5/30    Assessment and Plan: ANGEE GUPTON is a 83 y.o. female presenting with stroke. PMH is significant for history of stage IV lung cancer currently on chemotherapy, atrial fibrillation not on A/C, h/o multiple DVTs,  HLD, CKD III, and CHF.     Aphasia 2/2 multiple left hemispheric strokes, likely embolic:  Improving. PT/OT and SLP recommends home health PT/OT/SLP with intermittent supervision. - Neurology consulted, appreciate recs  - Continue Plavix 75 mg daily  - Fall precautions  - TEE on Monday per stroke team  H/o Stage IV lung CA with mets to bone. Currently on chemotherapy. Follows outpatient with Oncology. Per daughter has had poor po intake while on chemotherapy but is otherwise well. - Palliative consulted and patient wishing to continue immunotherapy at this time  Hx of CAD: Hx of CAD s/p stenting in 2007.   - Will resume metoprolol at decreased dose of 12.5mg  qhs as now outside of period for permissive HTN  HFpEF: Last Echo 06/2015 with EF 50-55% G1DD with enhanced echogenicity, thinning and akinesis of the inferior wallconsistent with prior infarct.  On exam no evidence of fluid overload and lung exam without crackles noted.  She denies shortness of breath on exertion.  At home on 40 mg Lasix as needed for edema and Lopressor.  - Continue to hold Lasix - Resuming metoprolol at decreased dose (12.5mg  qhs) - TEE as above - I/O's  - Daily weights   Hx of Afib: In NSR on exam. Not currently on anticoagulation. - Resume metoprolol at decreased dose of 12.5mg  qhs - Continue plavix  H/o multiple DVT's. Stable. Patient with no  calf tenderness or warmth noted on exam.   - Vas US dopplers negative - Continue to monitor - Continue Lovenox  Acute on CKD Stage IV.  Stable. SCr of 3.79.  BL appears ~3.1-3.6.  Follows outpatient with Nephrology. No uremia. Electrolytes stable.  -Daily BMET  -Avoid nephrotoxic agents -Consider inpatient nephro referral if Cr trends upwards  Constipation. Improved -Continue Senna  Abnormal UA: Moderate bacteria, large blood, large LE, and + Nitrites on UA yesterday. Patient afebrile and asymptomatic. Has chronic foley in. S/p 1 dose of antibiotic (unsure which one this was) - Will hold off on abx at this time as patient is afebrile and asymptomatic and we have a reason for her change in mental status. Additionally, has a chronic foley in.  Chronic Foley catheter: Patient has had foley in for 1 year. Per conversation with [facility], seems to have been left in place for healing of urethral stricture and malignant hydronephrosis but no set plan for when to disontinue. Is changed on the 1st of every month.  - Order placed for foley to be changed on 06/01 - Will defer long-term plan for outpatient management   FEN/GI: Regular diet, IVF @ [facility] Prophylaxis: Lovenox   Disposition: home with [facility] PT/OT/SLP  Subjective:  Feeling well this AM with no complaints. Is aware that she will be here through Monday for TEE. No acute events overnight.   Objective: Temp:  [97.5 F (36.4 C)-98.9 F (37.2 C)] 98.1 F (36.7 C) (06/02 1043) Pulse Rate:  [77-81] 78 (06/02  1043) Resp:  [17-20] 17 (06/02 1043) BP: (141-160)/(62-79) 147/67 (06/02 1043) SpO2:  [95 %-98 %] 98 % (06/02 1043) Physical Exam: Gen: Sitting in bedside chair in NAD. Pleasant.  Heart: RRR no murmurs appreciated Lungs: CTA bilaterally, normal WOB on RA Extremities: no edema or cyanosis Neuro: no focal deficits, speech normal Psych: appropriate mood and affect  Laboratory:  Recent Labs Lab 10/30/16 1414  10/30/16 1435 10/31/16 1120 11/01/16 0450  WBC 4.2  --  4.1 3.5*  HGB 10.5* 10.9* 10.1* 9.2*  HCT 34.0* 32.0* 32.1* 29.4*  PLT 204  --  183 161    Recent Labs Lab 10/29/16 0823  10/30/16 1414 10/30/16 1435 10/31/16 1120 11/01/16 0450  NA 141  < > 136 138 137 141  K 4.8  < > 4.9 5.0 4.5 4.5  CL  --   < > 109 108 110 114*  CO2 17*  --  17*  --  18* 18*  BUN 64.2*  < > 58* 63* 54* 50*  CREATININE 3.8*  < > 3.74* 3.90* 3.82* 3.79*  CALCIUM 8.9  --  8.6*  --  8.1* 8.3*  PROT 7.1  --  7.4  --   --   --   BILITOT 0.57  --  0.6  --   --   --   ALKPHOS 130  --  118  --   --   --   ALT 6  --  8*  --   --   --   AST 11  --  15  --   --   --   GLUCOSE 112  < > 96 90 211* 78  < > = values in this interval not displayed.   TSH elevated to 5.020, T4 normal at 1.06, A1c 5.3, lipid panel with LDL 129.   Imaging/Diagnostic Tests: No results found.   LE doppler Summary: No evidence of deep vein or superficial thrombosis involving the right lower extremity and left lower extremity.  Carotid US: Summary: Bilateral: mild mixed plaque origin ICA. Tortoisity noted bilaterally. 1-39% ICA stenosis. Vertebral artery flow is antegrade.  Verner Mould, MD 11/02/2016, 10:57 AM PGY-2, [place] Intern pager: [phone], text pages welcome

## 2016-11-02 NOTE — Progress Notes (Signed)
Occupational Therapy Treatment Patient Details Name: [name] MRN: [id number] DOB: February 19, 1934 Today's Date: 11/02/2016    History of present illness 83 Y/O F with PMX of Lung CA, HTN, Ischemic cardiomyopathy,HLD, GERD,DVT,CKD stage IV, Afib, chronic anemia presented with hx of difficulty with her speech MRI + L frontal/parietal infarct.   OT comments  Pt. Able to complete bed mobility and stand pivot to recliner.  Functional mobility still min/mod a due to RLE limitations and fluctuations in needs for assistance during RW use.  Attempted amb. To b.room and LB ADLS pt. Declined stating "i just cant".  Will continue to follow acutely.    Follow Up Recommendations  Home health OT;Supervision - Intermittent;Other (comment)    Equipment Recommendations  3 in 1 bedside commode    Recommendations for Other Services      Precautions / Restrictions Precautions Precautions: Fall       Mobility Bed Mobility Overal bed mobility: Needs Assistance Bed Mobility: Rolling;Sidelying to Sit Rolling: Supervision Sidelying to sit: Supervision          Transfers Overall transfer level: Needs assistance Equipment used: Rolling walker (2 wheeled) Transfers: Sit to/from [facility] Sit to Stand: Min assist;Mod assist Stand pivot transfers: Min assist;Mod assist       General transfer comment: bed to bsc to bed to chair with min to mod assist variable due to different direction and technique. R shoe fell off during ambulation.  pt. wants to have sneakers that tie vs. her loafers    Balance                                           ADL either performed or assessed with clinical judgement   ADL Overall ADL's : Needs assistance/impaired                     Lower Body Dressing: Maximal assistance;Sitting/lateral leans   Toilet Transfer: Moderate assistance;Stand-pivot;RW Toilet Transfer Details (indicate cue type and reason): simulated with  RW eob to recliner           General ADL Comments: Pt. able to sit unsupported eob and cross legs over to initiate doff/don of shoes and socks but then would say "i just cant, i cant do this".  encouragement as shoe and/or sock would almost be on but then she would stop.  notable fluctuations with agitation when she was asked to help.  "you have to explain what it is you want me to do".  reviewed and explained to patient as she had asked.  she continued to decline attemtps at LB dressing.  she explained she has [place] "in and out" but states no f/t help.  with that information in encouraged her to try agian to see if she could perform LB adls.  reviewed i noticed she gets frustrated when it is difficult but she needs to try and she is doing better than she thinks.  she kept saying oh i dont mind trying but then would not try.  at end of session she states "so thats it thats all you are going to do".  reviewed we had attempted LB ADLS and transfers. she had declined further ADL return demo and declined amb. to b.room.  offered again to attempt these items and she declined again.       Vision       Perception  Praxis      Cognition Arousal/Alertness: Awake/alert Behavior During Therapy: Agitated Overall Cognitive Status: Impaired/Different from baseline Area of Impairment: Attention;Safety/judgement;Problem solving                   Current Attention Level: Selective     Safety/Judgement: Decreased awareness of safety   Problem Solving: Slow processing General Comments: agitation when difficulty presents with desired task        Exercises     Shoulder Instructions       General Comments      Pertinent Vitals/ Pain       Pain Assessment: No/denies pain  Home Living                                          Prior Functioning/Environment              Frequency           Progress Toward Goals  OT Goals(current goals can now be found in  the care plan section)  Progress towards OT goals: Progressing toward goals     Plan Discharge plan remains appropriate    Co-evaluation                 AM-PAC PT "6 Clicks" Daily Activity     Outcome Measure                    End of Session Equipment Utilized During Treatment: Gait belt;Rolling walker  OT Visit Diagnosis: Unsteadiness on feet (R26.81);Muscle weakness (generalized) (M62.81);Other symptoms and signs involving cognitive function   Activity Tolerance Patient tolerated treatment well   Patient Left in chair;with call bell/phone within reach;with chair alarm set   Nurse Communication          Time: [phone] OT Time Calculation (min): 16 min  Charges: OT General Charges $OT Visit: 1 Procedure OT Treatments $Self Care/Home Management : 8-22 mins   Janice Coffin, COTA/L 11/02/2016, 8:53 AM

## 2016-11-02 NOTE — Progress Notes (Signed)
  Echocardiogram 2D Echocardiogram has been performed.  [name] 11/02/2016, 3:46 PM

## 2016-11-02 NOTE — Plan of Care (Signed)
Problem: Education: Goal: Knowledge of disease or condition will improve Outcome: Progressing Patient eduacated on early s/s of stroke. Pt needs reinforcement.

## 2016-11-02 NOTE — Progress Notes (Signed)
STROKE TEAM PROGRESS NOTE   SUBJECTIVE (INTERVAL HISTORY) Patient has no family members at the bedside. She states her speech has returned back to baseline. She does have a history of newly diagnosed atrial fibrillation as well as lung cancer   OBJECTIVE Temp:  [97.5 F (36.4 C)-99.4 F (37.4 C)] 97.9 F (36.6 C) (06/02 0620) Pulse Rate:  [77-87] 81 (06/02 0620) Cardiac Rhythm: Normal sinus rhythm (06/02 0700) Resp:  [18-20] 18 (06/02 0620) BP: (141-160)/(62-79) 153/62 (06/02 0620) SpO2:  [95 %-98 %] 97 % (06/02 0620)  CBC:  Recent Labs Lab 10/29/16 0823  10/30/16 1414  10/31/16 1120 11/01/16 0450  WBC 4.5  < > 4.2  --  4.1 3.5*  NEUTROABS 3.1  --  2.6  --   --   --   HGB 10.7*  --  10.5*  < > 10.1* 9.2*  HCT 32.5*  --  34.0*  < > 32.1* 29.4*  MCV 87.3  < > 89.2  --  88.7 88.3  PLT 206  < > 204  --  183 161  < > = values in this interval not displayed.  Basic Metabolic Panel:   Recent Labs Lab 10/31/16 1120 11/01/16 0450  NA 137 141  K 4.5 4.5  CL 110 114*  CO2 18* 18*  GLUCOSE 211* 78  BUN 54* 50*  CREATININE 3.82* 3.79*  CALCIUM 8.1* 8.3*    PHYSICAL EXAM Frail pleasant elderly Caucasian lady sitting comfortably in a bedside chair. Not in distress. . Afebrile. Head is nontraumatic. Neck is supple without bruit.    Cardiac exam no murmur or gallop. Lungs are clear to auscultation. Distal pulses are well felt. Neurological Exam :    Awake  Alert oriented x 3. Normal speech and language.eye movements full without nystagmus.fundi were not visualized. Vision acuity and fields appear normal. Hearing is normal. Palatal movements are normal. Face symmetric. Tongue midline. Normal strength, tone, reflexes and coordination except mild weakness of right ankle dorsiflexors and plantar flexors 4/5.Marland Kitchen Normal sensation. Gait deferred.  ASSESSMENT/PLAN Ms. ABENA ERDMAN is a 83 y.o. female with history of stage IV lung cancer currently on chemotherapy, atrial fibrillation  not on A/C, h/o multiple DVTs, HLD, CKD III, and CHF who developed confusion and speech changes 5/30 am. MRI with multiple strokes and UA positive for UTI. She did not receive IV t-PA due to being out of the tPA window.   Stroke:  left frontal and parietal, cortical and subcortical infarcts embolic secondary to known atrial fibrillation source, not on AC ? reason  Resultant  no deficits. Old right foot drop  MRI  Acute and subacute, cortical and subcortical infarcts L frontal, L parietal infarcts. Small vessel disease. Atrophy. Old B BG and cerebellar infarcts.  MRA  (not ordered)  Carotid Doppler  B ICA 1-39% stenosis, VAs antegrade   2D Echo  pending    LE doppler negative   LDL 129  HgbA1c 5.3  Lovenox 30 mg sq daily for VTE prophylaxis Diet regular Room service appropriate? Yes; Fluid consistency: Thin  No antithrombotic prior to admission, now on clopidogrel 75 mg daily (allergic to ASA)  Therapy recommendations:  HH OT, PT, 3N1  Disposition:  Return home with therapies  Atrial Fibrillation  Home anticoagulation:  none   Not an AC candidate d/t  ??    Hypertension  Stable Permissive hypertension (OK if < 220/120) but gradually normalize in 5-7 days Long-term BP goal normotensive  Hyperlipidemia  Home meds:  No statin  LDL 129, above goal  Add statintin  Other Stroke Risk Factors  Advanced age  68 / ETOH level not performed   Former Cigarette smoker  Coronary artery disease  Hx multiple DVTs  Chronic diastolic CHF  Other Active Problems  Stage IV non small lung cancer w/ bone mets  Acute on chronic kidney diease stage IV  Constipation  Abnormal UA  Chronic foley catheter  Hospital day # 3  I have personally examined this patient, reviewed notes, independently viewed imaging studies, participated in medical decision making and plan of care.ROS completed by me personally and pertinent positives fully documented  I have made any  additions or clarifications directly to the above note. She presented with transient speech and word finding difficulties secondary to embolic left MCA infarct etiology indeterminate but likely related to either atrial fibrillation or hypercoagulability from, lung cancer. The patient has known prior history of multiple DVTs and am unclear as to why she is not already on anticoagulation prior to admission. Add statin for elevated LDL.Discussed with family practice medical resident and answered questions . Recommend start anticoagulation with eliquis or Xarelto unless patient has a strong contraindication for the same. Discussed with family practice resident Antony Contras, MD Medical Director [place] Pager: [phone] 11/02/2016 12:52 PM To contact Stroke Continuity provider, please refer to [email or web address] After hours, contact General Neurology

## 2016-11-03 DIAGNOSIS — I63411 Cerebral infarction due to embolism of right middle cerebral artery: Secondary | ICD-10-CM

## 2016-11-03 DIAGNOSIS — I480 Paroxysmal atrial fibrillation: Secondary | ICD-10-CM

## 2016-11-03 DIAGNOSIS — E785 Hyperlipidemia, unspecified: Secondary | ICD-10-CM

## 2016-11-03 LAB — BASIC METABOLIC PANEL
ANION GAP: 5 (ref 5–15)
BUN: 45 mg/dL — ABNORMAL HIGH (ref 6–20)
CHLORIDE: 113 mmol/L — AB (ref 101–111)
CO2: 19 mmol/L — AB (ref 22–32)
CREATININE: 3.63 mg/dL — AB (ref 0.44–1.00)
Calcium: 8.2 mg/dL — ABNORMAL LOW (ref 8.9–10.3)
GFR calc non Af Amer: 11 mL/min — ABNORMAL LOW (ref >60)
GFR, EST AFRICAN AMERICAN: 13 mL/min — AB (ref >60)
Glucose, Bld: 83 mg/dL (ref 65–99)
Potassium: 4.2 mmol/L (ref 3.5–5.1)
SODIUM: 137 mmol/L (ref 135–145)

## 2016-11-03 LAB — CBC
HCT: 30.1 % — ABNORMAL LOW (ref 36.0–46.0)
HEMOGLOBIN: 9.4 g/dL — AB (ref 12.0–15.0)
MCH: 27.6 pg (ref 26.0–34.0)
MCHC: 31.2 g/dL (ref 30.0–36.0)
MCV: 88.3 fL (ref 78.0–100.0)
Platelets: 169 10*3/uL (ref 150–400)
RBC: 3.41 MIL/uL — AB (ref 3.87–5.11)
RDW: 14.3 % (ref 11.5–15.5)
WBC: 3.9 10*3/uL — AB (ref 4.0–10.5)

## 2016-11-03 MED ORDER — METOPROLOL TARTRATE 25 MG PO TABS: 12.5000 mg | ORAL_TABLET | Freq: Every day | ORAL | 0 refills | Status: DC

## 2016-11-03 MED ORDER — SENNOSIDES-DOCUSATE SODIUM 8.6-50 MG PO TABS: 1.0000 | ORAL_TABLET | Freq: Every evening | ORAL | 0 refills | Status: DC | PRN

## 2016-11-03 MED ORDER — APIXABAN 2.5 MG PO TABS: 2.5000 mg | ORAL_TABLET | Freq: Two times a day (BID) | ORAL | 0 refills | Status: DC

## 2016-11-03 MED ORDER — APIXABAN 2.5 MG PO TABS
2.5000 mg | ORAL_TABLET | Freq: Two times a day (BID) | ORAL | Status: DC
  Administered 2016-11-03 – 2016-11-04 (×2): 2.5 mg via ORAL
  Filled 2016-11-03 (×2): qty 1

## 2016-11-03 NOTE — Progress Notes (Signed)
Occupational Therapy Treatment Patient Details Name: BALEIGH RENNAKER MRN: [id number] DOB: [birth year] Today's Date: 11/03/2016    History of present illness 83 Y/O F with PMX of Lung CA, HTN, Ischemic cardiomyopathy,HLD, GERD,DVT,CKD stage IV, Afib, chronic anemia presented with hx of difficulty with her speech MRI + L frontal/parietal infarct.   OT comments  Improvements noted today with functional transfers and abilities to assist with UB/LB ADLs.  Will continue to follow acutely.    Follow Up Recommendations  Home health OT;Supervision - Intermittent;Other (comment)    Equipment Recommendations  3 in 1 bedside commode    Recommendations for Other Services      Precautions / Restrictions Precautions Precautions: Fall       Mobility Bed Mobility Overal bed mobility: Needs Assistance Bed Mobility: Rolling;Sidelying to Sit Rolling: Supervision            Transfers Overall transfer level: Needs assistance Equipment used: Rolling walker (2 wheeled) Transfers: Sit to/from [facility] Sit to Stand: Min assist Stand pivot transfers: Min assist       General transfer comment: decreased assistance required today.  states "im feeling better today"    Balance                                           ADL either performed or assessed with clinical judgement   ADL Overall ADL's : Needs assistance/impaired     Grooming: Wash/dry hands;Wash/dry face;Applying deodorant;Brushing hair;Sitting;Set up   Upper Body Bathing: Minimal assistance;Sitting   Lower Body Bathing: Maximal assistance;Sit to/from stand   Upper Body Dressing : Minimal assistance;Sitting   Lower Body Dressing: Maximal assistance;Sitting/lateral leans   Toilet Transfer: Minimal assistance;RW;Stand-pivot   Toileting- Clothing Manipulation and Hygiene: Maximal assistance;Sit to/from stand         General ADL Comments: pt. able to don left shoe today but not right.  states she has dtr. for that and also a cna that comes in multiple times a day.  states she gets assistance with bathing and dressing but does "help with some of it".  able to perform pericare in standing today with max a for thoroughness after BM.       Vision       Perception     Praxis      Cognition                                                Exercises     Shoulder Instructions       General Comments      Pertinent Vitals/ Pain       Pain Assessment: No/denies pain  Home Living                                          Prior Functioning/Environment              Frequency  Min 2X/week        Progress Toward Goals  OT Goals(current goals can now be found in the care plan section)  Progress towards OT goals: Progressing toward goals     Plan Discharge plan remains appropriate  Co-evaluation                 AM-PAC PT "6 Clicks" Daily Activity     Outcome Measure                    End of Session Equipment Utilized During Treatment: Rolling walker  OT Visit Diagnosis: Unsteadiness on feet (R26.81);Muscle weakness (generalized) (M62.81);Other symptoms and signs involving cognitive function   Activity Tolerance Patient tolerated treatment well   Patient Left in chair;with chair alarm set   Nurse Communication          Time: [id number] OT Time Calculation (min): 25 min  Charges: OT General Charges $OT Visit: 1 Procedure OT Treatments $Self Care/Home Management : 23-37 mins   Janice Coffin, COTA/L 11/03/2016, 8:31 AM

## 2016-11-03 NOTE — Progress Notes (Signed)
        [facility] has been requested to perform a transesophageal echocardiogram on [name] for Embolic stroke.  After careful review of history and examination, the risks and benefits of transesophageal echocardiogram have been explained including risks of esophageal damage, perforation (1:10,000 risk), bleeding, pharyngeal hematoma as well as other potential complications associated with conscious sedation including aspiration, arrhythmia, respiratory failure and death. Alternatives to treatment were discussed, questions were answered. Patient is willing to proceed.   Linus Mako, PA-C  11/03/2016 11:15 AM

## 2016-11-03 NOTE — Progress Notes (Signed)
Family Medicine Teaching Service Daily Progress Note Intern Pager: [phone]  Patient name: [name] Medical record number: [id number] Date of birth: [birth year] Age: 83 y.o. Gender: female  Primary Care Provider: Celene Squibb, MD Consultants: Neurology, palliative  Code Status: FULL   Pt Overview and Major Events to Date:  Admit 5/30    Assessment and Plan: [name] is a 83 y.o. female presenting with stroke. PMH is significant for history of stage IV lung cancer currently on chemotherapy, atrial fibrillation not on A/C, h/o multiple DVTs,  HLD, CKD III, and CHF.     Aphasia 2/2 multiple left hemispheric strokes, likely embolic:  Improving. PT/OT and SLP recommends home health PT/OT/SLP with intermittent supervision. - Neurology consulted, appreciate recs  - Continue Plavix 75 mg daily  - Fall precautions  - TEE on Monday per stroke team  H/o Stage IV lung CA with mets to bone. Currently on chemotherapy. Follows outpatient with Oncology - Palliative consulted and patient wishing to continue immunotherapy at this time - follow up outpt with oncology  Hx of CAD: Hx of CAD s/p stenting in 2007.   - Will resume metoprolol at decreased dose of 12.5mg  qhs as now outside of period for permissive HTN  HFpEF: stable.  At home on 40 mg Lasix as needed for edema and Lopressor.  - Continue to hold Lasix - Continue metoprolol at decreased dose (12.5mg  qhs) - TEE as above - I/O's  - Daily weights   Hx of Afib: In NSR on exam. Not currently on anticoagulation. - Continue metoprolol at decreased dose of 12.5mg  qhs - Plan to start eliquis, will discuss with patient and daughter today. Per patient, bleeding event with Xarelto in past  H/o multiple DVT's. Stable. Vas US dopplers negative - Continue Lovenox  Acute on CKD Stage IV.  Stable. SCr of 3.41 today.  BL appears ~3.1-3.6.  Follows outpatient with Nephrology -daily BMET -Avoid nephrotoxic agents  Constipation.  Chronic, Improved -Continue Senna  Chronic Foley catheter: Patient has had foley in for 1 year. Per conversation with [facility], seems to have been left in place for healing of urethral stricture and malignant hydronephrosis but no set plan for when to disontinue. Is changed on the 1st of every month.  - Foley changed on 06/01 - Will defer long-term plan for outpatient management   FEN/GI: Regular diet, IVF @ [facility] Prophylaxis: Lovenox   Disposition: home with [facility] PT/OT/SLP  Subjective:  Doing well, feels speech is improved. Waiting for daughter to discuss anticoagulation. No complaints or concerns at this time.  Objective: Temp:  [98.1 F (36.7 C)-99 F (37.2 C)] 98.3 F (36.8 C) (06/03 0530) Pulse Rate:  [69-89] 69 (06/03 0530) Resp:  [16-20] 18 (06/03 0530) BP: (133-156)/(67-78) 133/78 (06/03 0530) SpO2:  [98 %] 98 % (06/03 0530) Physical Exam: Gen: Elderly lady sitting in bedside chair in NAD. Pleasant.  Heart: RRR no murmurs appreciated Lungs: CTA bilaterally, normal WOB on RA Extremities: no edema or cyanosis Neuro: no focal deficits, speech normal Psych: appropriate mood and affect  Laboratory:  Recent Labs Lab 10/31/16 1120 11/01/16 0450 11/03/16 0426  WBC 4.1 3.5* 3.9*  HGB 10.1* 9.2* 9.4*  HCT 32.1* 29.4* 30.1*  PLT 183 161 169    Recent Labs Lab 10/29/16 0823  10/30/16 1414  10/31/16 1120 11/01/16 0450 11/03/16 0426  NA 141  --  136  < > 137 141 137  K 4.8  --  4.9  < > 4.5  4.5 4.2  CL  --   --  109  < > 110 114* 113*  CO2 17*  < > 17*  --  18* 18* 19*  BUN 64.2*  --  58*  < > 54* 50* 45*  CREATININE 3.8*  --  3.74*  < > 3.82* 3.79* 3.63*  CALCIUM 8.9  < > 8.6*  --  8.1* 8.3* 8.2*  PROT 7.1  --  7.4  --   --   --   --   BILITOT 0.57  --  0.6  --   --   --   --   ALKPHOS 130  --  118  --   --   --   --   ALT 6  --  8*  --   --   --   --   AST 11  --  15  --   --   --   --   GLUCOSE 112  --  96  < > 211* 78 83  < > = values in this  interval not displayed.   TSH elevated to 5.020, T4 normal at 1.06, A1c 5.3, lipid panel with LDL 129.   Imaging/Diagnostic Tests: No results found.   LE doppler Summary: No evidence of deep vein or superficial thrombosis involving the right lower extremity and left lower extremity.  Carotid US: Summary: Bilateral: mild mixed plaque origin ICA. Tortoisity noted bilaterally. 1-39% ICA stenosis. Vertebral artery flow is Antegrade.  Echo: Study Conclusions  - Left ventricle: The cavity size was normal. Wall thickness was   increased in a pattern of mild LVH. Systolic function was mildly   to moderately reduced. The estimated ejection fraction was in the   range of 40% to 45%. Doppler parameters are consistent with   abnormal left ventricular relaxation (grade 1 diastolic   dysfunction). - Regional wall motion abnormality: Akinesis of the mid inferior   and mid inferolateral myocardium. - Aortic valve: Morphologically, there appears to be mild to   moderate aortic stenosis. Peak velocities and mean gradients   appear to be underestimated. Moderately calcified leaflets. Cusp   separation was reduced. There was moderate regurgitation. Peak   velocity (S): 192 cm/s. Mean gradient (S): 7 mm Hg. Valve area   (VTI): 1.29 cm^2. Valve area (Vmax): 1.41 cm^2. Valve area   (Vmean): 1.45 cm^2. - Mitral valve: Calcified annulus. There was moderate   regurgitation. - Left atrium: The atrium was mildly dilated. - Right ventricle: Systolic function was mildly reduced. Lateral   annulus peak S velocity: 9.41 cm/s.  Steve Rattler, DO 11/03/2016, 10:19 AM PGY-1, [place] Intern pager: [phone], text pages welcome

## 2016-11-03 NOTE — Progress Notes (Signed)
Spoke with family Medicine attending she verified that patient will not have TEE performed tomorrow and will be discharged.

## 2016-11-03 NOTE — Progress Notes (Signed)
STROKE TEAM PROGRESS NOTE   SUBJECTIVE (INTERVAL HISTORY) Patient daughter and another family member are at the bedside. Pt apparently back to baseline. Pt was first diagnosed with afib in 06/2015, was on lovenox and then switched to Xarelto. However, pt can not tolerate with Xarelto due to GIB. Hence, it was discontinued and pt was not on [facility] PTA. Pt has lung cancer metastasis to bone and followed with Dr. Earlie Server. Pt at baseline mostly bed bound but able to wheel herself in wheelchair.   OBJECTIVE Temp:  [98.2 F (36.8 C)-99 F (37.2 C)] 98.2 F (36.8 C) (06/03 1033) Pulse Rate:  [69-89] 71 (06/03 1033) Cardiac Rhythm: Normal sinus rhythm (06/03 0700) Resp:  [16-20] 18 (06/03 1033) BP: (133-156)/(70-78) 151/74 (06/03 1033) SpO2:  [98 %-99 %] 99 % (06/03 1033)  CBC:  Recent Labs Lab 10/29/16 0823 10/30/16 1414  11/01/16 0450 11/03/16 0426  WBC 4.5 4.2  < > 3.5* 3.9*  NEUTROABS 3.1 2.6  --   --   --   HGB 10.7* 10.5*  < > 9.2* 9.4*  HCT 32.5* 34.0*  < > 29.4* 30.1*  MCV 87.3 89.2  < > 88.3 88.3  PLT 206 204  < > 161 169  < > = values in this interval not displayed.  Basic Metabolic Panel:   Recent Labs Lab 11/01/16 0450 11/03/16 0426  NA 141 137  K 4.5 4.2  CL 114* 113*  CO2 18* 19*  GLUCOSE 78 83  BUN 50* 45*  CREATININE 3.79* 3.63*  CALCIUM 8.3* 8.2*   Mr Brain Wo Contrast 10/30/2016 IMPRESSION: 1. Acute/subacute cortical and subcortical infarct involving the anterior left frontal lobe along the middle frontal gyrus. 2. Acute/subacute cortical infarct involving the left parietal lobe, potentially along the watershed territory. 3. Advanced atrophy and diffuse white matter disease compatible with chronic microvascular ischemic changes. 4. Remote lacunar infarcts involving the basal ganglia bilaterally. 5. Diffuse white matter disease into the brainstem with remote lacunar infarcts in the cerebellum bilaterally, right greater than left.   TTE 11/02/2016 - Left  ventricle: The cavity size was normal. Wall thickness was   increased in a pattern of mild LVH. Systolic function was mildly   to moderately reduced. The estimated ejection fraction was in the   range of 40% to 45%. Doppler parameters are consistent with   abnormal left ventricular relaxation (grade 1 diastolic   dysfunction). - Regional wall motion abnormality: Akinesis of the mid inferior   and mid inferolateral myocardium. - Aortic valve: Morphologically, there appears to be mild to   moderate aortic stenosis. Peak velocities and mean gradients   appear to be underestimated. Moderately calcified leaflets. Cusp   separation was reduced. There was moderate regurgitation. Peak   velocity (S): 192 cm/s. Mean gradient (S): 7 mm Hg. Valve area   (VTI): 1.29 cm^2. Valve area (Vmax): 1.41 cm^2. Valve area   (Vmean): 1.45 cm^2. - Mitral valve: Calcified annulus. There was moderate   regurgitation. - Left atrium: The atrium was mildly dilated. - Right ventricle: Systolic function was mildly reduced. Lateral   annulus peak S velocity: 9.41 cm/s.  LE venous doppler - no DVT  CUS - Bilateral: 1-39% ICA stenosis. Vertebral artery flow is antegrade.  TCD pending    PHYSICAL EXAM Frail pleasant elderly Caucasian lady sitting comfortably in a bedside chair. Not in distress. . Afebrile. Head is nontraumatic. Neck is supple without bruit.  Cardiac exam no murmur or gallop, irregularly irregular heart rate and rhythm.  Lungs are clear to auscultation. Distal pulses are well felt.  Neurological Exam :  Awake  Alert oriented x 3. Normal speech and language. Eye movements full without nystagmus. Fundi were not visualized. Vision acuity and fields appear normal. Hearing is normal. Palatal movements are normal. Face symmetric. Tongue midline. Normal strength, tone, reflexes and coordination except mild weakness of right ankle dorsiflexors and plantar flexors 4/5. Normal sensation. Gait not  tested.   ASSESSMENT/PLAN Ms. CATRENA VARI is a 83 y.o. female with history of stage IV lung cancer currently on chemotherapy, atrial fibrillation not on A/C, h/o multiple DVTs, HLD, CKD III, and CHF who developed confusion and speech changes 5/30 am. MRI with multiple strokes and UA positive for UTI. She did not receive IV t-PA due to being out of the tPA window.   Stroke:  left MCA territory (frontal and parietal, cortical and subcortical infarcts), embolic secondary to known atrial fibrillation not on AC (due to previous GIB with Xarelto) vs. Hypercoagulable state due to metastatic lung cancer  Resultant  no deficits. Old right foot drop  MRI brain Acute and subacute, cortical and subcortical infarcts L frontal, L parietal infarcts. Small vessel disease. Atrophy. Old B BG and cerebellar infarcts.  Transcranial Dopplers - pending  Carotid Doppler  B ICA 1-39% stenosis, VAs antegrade   2D Echo - EF 40-45%. No cardiac source of emboli identified.  LE doppler negative for DVT  TEE not needed as it will not change management.  LDL 129  HgbA1c 5.3  Lovenox 30 mg sq daily for VTE prophylaxis Diet regular Room service appropriate? Yes; Fluid consistency: Thin Diet NPO time specified  No antithrombotic prior to admission, was put on clopidogrel 75 mg daily (allergic to ASA). Due to afib with stroke, recommended anticoagulation at this time. Discussed with family and pt regarding comadin vs. Eliquis. They would like eliquis. However, according to pt age, Cre, and weight, will recommend eliquis 2.5mg  bid at this time. Close monitoring and bleeding precautions.   Therapy recommendations:  HH OT, PT, 3N1  Disposition:  Return home with therapies  Atrial Fibrillation not on [facility]  Home anticoagulation:  none   Not an AC candidate d/t GIB with Xarelto in the past  Discussed with family and pt regarding comadin vs. Eliquis. Coumadin may have higher risk of GIB since pt had it before.  However, her Cre makes her not a good candidate for eliquis. Eventually pt and family chose eliquis. According to pt age, Cre, and weight, will recommend eliquis 2.5mg  bid at this time.   Close monitoring and bleeding precautions.   Lung cancer with bony metastasis  On chemo  had palliative care service  Follows with Dr. Earlie Server   Continue anticoagulation for potential hypercoagulable state  Hypertension  Stable Permissive hypertension (OK if < 180/120) but gradually normalize in 5-7 days Long-term BP goal normotensive  Hyperlipidemia  Home meds:  No statin  LDL 129, not at goal  Pt has statin allergy listed  Follow up with PCP and cardiology consider PCSK9 inhibitors  Other Stroke Risk Factors  Advanced age  Former Cigarette smoker  CAD  Hx of LE and UE DVTs  Chronic diastolic CHF  Other Active Problems  Chronic kidney diease stage IV - Cre 3.63  Chronic foley catheter  Anemia due to CKD- 9.4 / 30.1   Hospital day # 4  Rosalin Hawking, MD PhD Stroke Neurology 11/03/2016 10:23 PM  To contact Stroke Continuity provider, please refer to [email or web address] After  hours, contact General Neurology

## 2016-11-03 NOTE — Discharge Instructions (Signed)
Apixaban oral tablets °What is this medicine? °APIXABAN (a PIX a ban) is an anticoagulant (blood thinner). It is used to lower the chance of stroke in people with a medical condition called atrial fibrillation. It is also used to treat or prevent blood clots in the lungs or in the veins. °This medicine may be used for other purposes; ask your health care provider or pharmacist if you have questions. °COMMON BRAND NAME(S): Eliquis °What should I tell my health care provider before I take this medicine? °They need to know if you have any of these conditions: °-bleeding disorders °-bleeding in the brain °-blood in your stools (black or tarry stools) or if you have blood in your vomit °-history of stomach bleeding °-kidney disease °-liver disease °-mechanical heart valve °-an unusual or allergic reaction to apixaban, other medicines, foods, dyes, or preservatives °-pregnant or trying to get pregnant °-breast-feeding °How should I use this medicine? °Take this medicine by mouth with a glass of water. Follow the directions on the prescription label. You can take it with or without food. If it upsets your stomach, take it with food. Take your medicine at regular intervals. Do not take it more often than directed. Do not stop taking except on your doctor's advice. Stopping this medicine may increase your risk of a blot clot. Be sure to refill your prescription before you run out of medicine. °Talk to your pediatrician regarding the use of this medicine in children. Special care may be needed. °Overdosage: If you think you have taken too much of this medicine contact a poison control center or emergency room at once. °NOTE: This medicine is only for you. Do not share this medicine with others. °What if I miss a dose? °If you miss a dose, take it as soon as you can. If it is almost time for your next dose, take only that dose. Do not take double or extra doses. °What may interact with this medicine? °This medicine may  interact with the following: °-aspirin and aspirin-like medicines °-certain medicines for fungal infections like ketoconazole and itraconazole °-certain medicines for seizures like carbamazepine and phenytoin °-certain medicines that treat or prevent blood clots like warfarin, enoxaparin, and dalteparin °-clarithromycin °-NSAIDs, medicines for pain and inflammation, like ibuprofen or naproxen °-rifampin °-ritonavir °-St. John's wort °This list may not describe all possible interactions. Give your health care provider a list of all the medicines, herbs, non-prescription drugs, or dietary supplements you use. Also tell them if you smoke, drink alcohol, or use illegal drugs. Some items may interact with your medicine. °What should I watch for while using this medicine? °Visit your doctor or health care professional for regular checks on your progress. °Notify your doctor or health care professional and seek emergency treatment if you develop breathing problems; changes in vision; chest pain; severe, sudden headache; pain, swelling, warmth in the leg; trouble speaking; sudden numbness or weakness of the face, arm or leg. These can be signs that your condition has gotten worse. °If you are going to have surgery or other procedure, tell your doctor that you are taking this medicine. °What side effects may I notice from receiving this medicine? °Side effects that you should report to your doctor or health care professional as soon as possible: °-allergic reactions like skin rash, itching or hives, swelling of the face, lips, or tongue °-signs and symptoms of bleeding such as bloody or black, tarry stools; red or dark-brown urine; spitting up blood or brown material that looks like coffee   grounds; red spots on the skin; unusual bruising or bleeding from the eye, gums, or nose °This list may not describe all possible side effects. Call your doctor for medical advice about side effects. You may report side effects to FDA at  1-800-FDA-1088. °Where should I keep my medicine? °Keep out of the reach of children. °Store at room temperature between 20 and 25 degrees C (68 and 77 degrees F). Throw away any unused medicine after the expiration date. °NOTE: This sheet is a summary. It may not cover all possible information. If you have questions about this medicine, talk to your doctor, pharmacist, or health care provider. °© 2018 Elsevier/Gold Standard (2015-12-11 11:54:23) °  ° °

## 2016-11-04 ENCOUNTER — Other Ambulatory Visit (HOSPITAL_COMMUNITY): Payer: Medicare Other

## 2016-11-04 DIAGNOSIS — E785 Hyperlipidemia, unspecified: Secondary | ICD-10-CM

## 2016-11-04 DIAGNOSIS — I639 Cerebral infarction, unspecified: Secondary | ICD-10-CM

## 2016-11-04 DIAGNOSIS — I4891 Unspecified atrial fibrillation: Secondary | ICD-10-CM

## 2016-11-04 LAB — URINE CULTURE

## 2016-11-04 MED ORDER — APIXABAN 2.5 MG PO TABS: 2.5000 mg | ORAL_TABLET | Freq: Two times a day (BID) | ORAL | Status: DC

## 2016-11-04 MED ORDER — HEPARIN SOD (PORK) LOCK FLUSH 100 UNIT/ML IV SOLN
500.0000 [IU] | INTRAVENOUS | Status: DC | PRN
  Administered 2016-11-04: 500 [IU]
  Filled 2016-11-04 (×2): qty 5

## 2016-11-04 MED ORDER — HEPARIN SOD (PORK) LOCK FLUSH 100 UNIT/ML IV SOLN
500.0000 [IU] | INTRAVENOUS | Status: DC
  Filled 2016-11-04: qty 5

## 2016-11-04 NOTE — Progress Notes (Signed)
CM spoke to [name] with [facility] and she accepted the referral.

## 2016-11-04 NOTE — Progress Notes (Signed)
Physical Therapy Treatment Patient Details Name: [name] MRN: [id number] DOB: 1934/01/06 Today's Date: 11/04/2016    History of Present Illness 83 Y/O F with PMX of Lung CA, HTN, Ischemic cardiomyopathy,HLD, GERD,DVT,CKD stage IV, Afib, chronic anemia presented with hx of difficulty with her speech MRI + L frontal/parietal infarct.    PT Comments    Pt decline transfer to chair due to pt's family preparing for d/c home. PT provided LE strengthening exercises to improve strength, and provided HEP handout. PT demonstrated plantarflexor stretching to pt's daughter to assist with contracture management upon d/c home. PT also provided stroke education booklet and recognizing signs of stroke. Pt would benefit from continued skilled PT to improve functional mobility and strength. Current plan of care remains appropriate.    Follow Up Recommendations  Home health PT;Supervision for mobility/OOB     Equipment Recommendations  None recommended by PT    Recommendations for Other Services       Precautions / Restrictions Precautions Precautions: Fall Precaution Comments: Provided booklet on stroke education and HEP handout for LE exercises  Restrictions Weight Bearing Restrictions: No    Mobility  Bed Mobility Overal bed mobility: Needs Assistance             General bed mobility comments: Receied sitting EOB  Transfers                    Ambulation/Gait                 Stairs            Wheelchair Mobility    Modified Rankin (Stroke Patients Only)       Balance   Sitting-balance support: No upper extremity supported;Feet supported Sitting balance-Leahy Scale: Good                                      Cognition Arousal/Alertness: Awake/alert Behavior During Therapy: WFL for tasks assessed/performed Overall Cognitive Status: Within Functional Limits for tasks assessed                                         Exercises General Exercises - Lower Extremity Ankle Circles/Pumps: AROM;Both;5 reps Long Arc Quad: AROM;Right;Left;10 reps Hip ABduction/ADduction: AROM;Both;5 reps Hip Flexion/Marching: AROM;Both;10 reps    General Comments General comments (skin integrity, edema, etc.): Pt preparing for discharge. PT provided HEP and educated pt's daugther on R plantarflexor stretching for PF contracture. PT also provided education about signs of stroke and provided handout on stroke education.       Pertinent Vitals/Pain Pain Assessment: No/denies pain    Home Living                      Prior Function            PT Goals (current goals can now be found in the care plan section) Acute Rehab PT Goals PT Goal Formulation: With patient/family Time For Goal Achievement: 11/14/16 Potential to Achieve Goals: Good Progress towards PT goals: Progressing toward goals    Frequency    Min 3X/week      PT Plan Current plan remains appropriate    Co-evaluation              AM-PAC PT "6 Clicks" Daily Activity  Outcome  Measure  Difficulty turning over in bed (including adjusting bedclothes, sheets and blankets)?: A Little Difficulty moving from lying on back to sitting on the side of the bed? : A Little Difficulty sitting down on and standing up from a chair with arms (e.g., wheelchair, bedside commode, etc,.)?: A Little Help needed moving to and from a bed to chair (including a wheelchair)?: A Little Help needed walking in hospital room?: A Lot Help needed climbing 3-5 steps with a railing? : A Lot 6 Click Score: 16    End of Session   Activity Tolerance: Patient tolerated treatment well Patient left: in bed;with call bell/phone within reach;with family/visitor present Nurse Communication: Mobility status PT Visit Diagnosis: Other abnormalities of gait and mobility (R26.89);Muscle weakness (generalized) (M62.81)     Time: 2751-7001 PT Time Calculation (min) (ACUTE  ONLY): 13 min  Charges:  $Therapeutic Exercise: 8-22 mins                    G Codes:       Loma Sousa, SPT  [phone]   Loma Sousa 11/04/2016, 9:13 AM

## 2016-11-04 NOTE — Progress Notes (Signed)
Patient ready for discharge to home; discharge instructions given and reviewed; Rx sent electronically; patient discharge accompanied home by her daughter.

## 2016-11-04 NOTE — Care Management Note (Signed)
Case Management Note  Patient Details  Name: [name] MRN: [id number] Date of Birth: 1933/09/13  Subjective/Objective:                    Action/Plan: Pt discharged home with orders for [facility] services. CM met with the patient and her daughter to provide a list of [facility] agencies. They have used [facility] in the past and would like to use them again.  Pt also with orders for 3 in 1. Pt's family asking to have orders for the equipment and they will pick it up on the way home from [place] store. CM provided them the orders.  Pt d/c on Eliquis. Pts daughter already in possession of a 30 day free Eliquis card.  Family providing transportation home and pt to stay with her daughter at d/c.   Expected Discharge Date:  11/04/16               Expected Discharge Plan:  [place]  In-House Referral:     Discharge planning Services  CM Consult  Post Acute Care Choice:  Home Health, Durable Medical Equipment Choice offered to:  Patient, Adult Children  DME Arranged:  3-N-1 (family given orders) DME Agency:     HH Arranged:  PT, OT, Speech Therapy [place] Agency:  [facility] (now [facility])  Status of Service:  Completed, signed off  If discussed at [place] of Stay Meetings, dates discussed:    Additional Comments:  Pollie Friar, RN 11/04/2016, 10:35 AM

## 2016-11-06 DIAGNOSIS — C3490 Malignant neoplasm of unspecified part of unspecified bronchus or lung: Secondary | ICD-10-CM | POA: Diagnosis not present

## 2016-11-06 DIAGNOSIS — I5032 Chronic diastolic (congestive) heart failure: Secondary | ICD-10-CM | POA: Diagnosis not present

## 2016-11-06 DIAGNOSIS — Z86718 Personal history of other venous thrombosis and embolism: Secondary | ICD-10-CM | POA: Diagnosis not present

## 2016-11-06 DIAGNOSIS — Z87440 Personal history of urinary (tract) infections: Secondary | ICD-10-CM | POA: Diagnosis not present

## 2016-11-06 DIAGNOSIS — Z466 Encounter for fitting and adjustment of urinary device: Secondary | ICD-10-CM | POA: Diagnosis not present

## 2016-11-06 DIAGNOSIS — I130 Hypertensive heart and chronic kidney disease with heart failure and stage 1 through stage 4 chronic kidney disease, or unspecified chronic kidney disease: Secondary | ICD-10-CM | POA: Diagnosis not present

## 2016-11-06 DIAGNOSIS — I2510 Atherosclerotic heart disease of native coronary artery without angina pectoris: Secondary | ICD-10-CM | POA: Diagnosis not present

## 2016-11-06 DIAGNOSIS — G8311 Monoplegia of lower limb affecting right dominant side: Secondary | ICD-10-CM | POA: Diagnosis not present

## 2016-11-06 DIAGNOSIS — C7951 Secondary malignant neoplasm of bone: Secondary | ICD-10-CM | POA: Diagnosis not present

## 2016-11-06 DIAGNOSIS — N183 Chronic kidney disease, stage 3 (moderate): Secondary | ICD-10-CM | POA: Diagnosis not present

## 2016-11-07 DIAGNOSIS — N183 Chronic kidney disease, stage 3 (moderate): Secondary | ICD-10-CM | POA: Diagnosis not present

## 2016-11-07 DIAGNOSIS — G8311 Monoplegia of lower limb affecting right dominant side: Secondary | ICD-10-CM | POA: Diagnosis not present

## 2016-11-07 DIAGNOSIS — Z86718 Personal history of other venous thrombosis and embolism: Secondary | ICD-10-CM | POA: Diagnosis not present

## 2016-11-07 DIAGNOSIS — C3490 Malignant neoplasm of unspecified part of unspecified bronchus or lung: Secondary | ICD-10-CM | POA: Diagnosis not present

## 2016-11-07 DIAGNOSIS — Z466 Encounter for fitting and adjustment of urinary device: Secondary | ICD-10-CM | POA: Diagnosis not present

## 2016-11-07 DIAGNOSIS — Z87440 Personal history of urinary (tract) infections: Secondary | ICD-10-CM | POA: Diagnosis not present

## 2016-11-07 DIAGNOSIS — C7951 Secondary malignant neoplasm of bone: Secondary | ICD-10-CM | POA: Diagnosis not present

## 2016-11-07 DIAGNOSIS — I5032 Chronic diastolic (congestive) heart failure: Secondary | ICD-10-CM | POA: Diagnosis not present

## 2016-11-07 DIAGNOSIS — I130 Hypertensive heart and chronic kidney disease with heart failure and stage 1 through stage 4 chronic kidney disease, or unspecified chronic kidney disease: Secondary | ICD-10-CM | POA: Diagnosis not present

## 2016-11-07 DIAGNOSIS — I2510 Atherosclerotic heart disease of native coronary artery without angina pectoris: Secondary | ICD-10-CM | POA: Diagnosis not present

## 2016-11-08 ENCOUNTER — Other Ambulatory Visit: Payer: Self-pay | Admitting: *Deleted

## 2016-11-08 DIAGNOSIS — I5032 Chronic diastolic (congestive) heart failure: Secondary | ICD-10-CM | POA: Diagnosis not present

## 2016-11-08 DIAGNOSIS — G8311 Monoplegia of lower limb affecting right dominant side: Secondary | ICD-10-CM | POA: Diagnosis not present

## 2016-11-08 DIAGNOSIS — Z87440 Personal history of urinary (tract) infections: Secondary | ICD-10-CM | POA: Diagnosis not present

## 2016-11-08 DIAGNOSIS — C7951 Secondary malignant neoplasm of bone: Secondary | ICD-10-CM | POA: Diagnosis not present

## 2016-11-08 DIAGNOSIS — Z86718 Personal history of other venous thrombosis and embolism: Secondary | ICD-10-CM | POA: Diagnosis not present

## 2016-11-08 DIAGNOSIS — N183 Chronic kidney disease, stage 3 (moderate): Secondary | ICD-10-CM | POA: Diagnosis not present

## 2016-11-08 DIAGNOSIS — I130 Hypertensive heart and chronic kidney disease with heart failure and stage 1 through stage 4 chronic kidney disease, or unspecified chronic kidney disease: Secondary | ICD-10-CM | POA: Diagnosis not present

## 2016-11-08 DIAGNOSIS — C3490 Malignant neoplasm of unspecified part of unspecified bronchus or lung: Secondary | ICD-10-CM | POA: Diagnosis not present

## 2016-11-08 DIAGNOSIS — Z466 Encounter for fitting and adjustment of urinary device: Secondary | ICD-10-CM | POA: Diagnosis not present

## 2016-11-08 DIAGNOSIS — I2510 Atherosclerotic heart disease of native coronary artery without angina pectoris: Secondary | ICD-10-CM | POA: Diagnosis not present

## 2016-11-08 NOTE — Patient Outreach (Signed)
Patient triggered Red on [facility]i Stroke Dashboard, notification sent to [name], RN

## 2016-11-08 NOTE — Patient Outreach (Signed)
[place] [facility]) Care Management  11/08/2016  [name] [birth year] [id number]  EMMI-Stroke RED ON EMMI ALERT DAY#:3 DATE: 11/07/16 RED ALERT: Questions/problems with meds? Yes   Outreach attempt # 1 spoke with patient. She requested to be called back. She stated, she was "eating lunch".    Plan:  RNCM will contact patient within the next 3 hours.  [name], RN, BSN, MHA/MSL, [place] Telephonic Care Manager Coordinator [facility] Direct Phone: [phone] Toll Free: [phone] Fax: [phone]

## 2016-11-11 ENCOUNTER — Other Ambulatory Visit: Payer: Self-pay | Admitting: *Deleted

## 2016-11-11 DIAGNOSIS — Z86718 Personal history of other venous thrombosis and embolism: Secondary | ICD-10-CM | POA: Diagnosis not present

## 2016-11-11 DIAGNOSIS — I5032 Chronic diastolic (congestive) heart failure: Secondary | ICD-10-CM | POA: Diagnosis not present

## 2016-11-11 DIAGNOSIS — N183 Chronic kidney disease, stage 3 (moderate): Secondary | ICD-10-CM | POA: Diagnosis not present

## 2016-11-11 DIAGNOSIS — Z87440 Personal history of urinary (tract) infections: Secondary | ICD-10-CM | POA: Diagnosis not present

## 2016-11-11 DIAGNOSIS — C3490 Malignant neoplasm of unspecified part of unspecified bronchus or lung: Secondary | ICD-10-CM | POA: Diagnosis not present

## 2016-11-11 DIAGNOSIS — Z466 Encounter for fitting and adjustment of urinary device: Secondary | ICD-10-CM | POA: Diagnosis not present

## 2016-11-11 DIAGNOSIS — G8311 Monoplegia of lower limb affecting right dominant side: Secondary | ICD-10-CM | POA: Diagnosis not present

## 2016-11-11 DIAGNOSIS — I2510 Atherosclerotic heart disease of native coronary artery without angina pectoris: Secondary | ICD-10-CM | POA: Diagnosis not present

## 2016-11-11 DIAGNOSIS — C7951 Secondary malignant neoplasm of bone: Secondary | ICD-10-CM | POA: Diagnosis not present

## 2016-11-11 DIAGNOSIS — I130 Hypertensive heart and chronic kidney disease with heart failure and stage 1 through stage 4 chronic kidney disease, or unspecified chronic kidney disease: Secondary | ICD-10-CM | POA: Diagnosis not present

## 2016-11-11 NOTE — Patient Outreach (Addendum)
[facility]) Care Management  11/11/2016  [name] [birth year] [id number]  [name] is an 83 year old female primary care patient of Dr. Wende Neighbors with medical history which includes CVA, Stage IV lung cancer with mets to bone Curt Bears MD Oncologist), atrial fibrillation, history of multiple DVT's, chronic anemia, hyperlipidemia, chronic kidney disease stage 3, Ischemic cardiomyopathy and congestive heart failure.  [name] presented to the ED on 10/30/16 with difficulty with her speech, although improved at the time of presentation. She denied headache or weakness but endorsed lower back pain which started after a chemotherapy treatment.   [name] was discharged to home on 11/04/16. She was referred to [facility] after triggering Red on the [facility] Stroke Dashboard and was contacted by the telephonic case manager then referred to me for community engagement and transition of care.   I spoke with [name] by phone today. She was conversant and appreciative of my call indicating that she was expecting to hear from me. She told me that her home health nurse has been visiting. She has all her prescribed medications and is taking them according her provider orders. She has had hematuria since discharge from the hospital for which she reports her [facility] contacted her provider. Orders from the provider are to reach out to him again if this does not clear by tomorrow and he will consider adjustment or discontinuation of Eliquis.   Plan: [name] agreed to my calling or visiting her for transition of care needs but asked that I delay my in person visit until a few weeks from now as she is very busy with home health. I have scheduled [name] for another call next week.   [name] will continue to work with the cancer care team and her home health team and will call her providers or me if she encounters issues for which she needs additional  assistance.    [facility]  [phone]

## 2016-11-11 NOTE — Addendum Note (Signed)
Addended by: Gabriel Cirri on: 11/11/2016 11:13 AM   Modules accepted: Orders

## 2016-11-11 NOTE — Patient Outreach (Signed)
IKEA DEMICCO 08/23/1933 [id number]   EMMI-Stroke RED ON EMMI ALERT DAY#:3 DATE: 11/07/16 RED ALERT: Questions/problems with meds? Yes  Outreach attempt # 2 spoke with patient regarding EMMI questionnaire. HIPAA verified with patient.  Patient reported having questions/problems with medications. Patient was discharged from the hospital on 11/04/16. She was discharged home on Eliquis for a recent CVA. Patient verbalized stopping the Eliquis the day after discharged. She noticed blood in her Foley catheter, which is chronic. Patient did not report stopping the medication to her PCP. Patient's daughter attempted to make a discharge appointment with patient's PCP. She was given an appointment scheduled 3 weeks after discharge. Per daughter, patient appears to be for forgetful and agitated after having a stroke. Patient reported, "she was in the bed, because she didn't feel well". The daughter stated, hjer mother became upset with her caregiver about money. Patient is active with [facility] (PT/OT/RN/CNA). The plan is to have a ST in addition to the other services. RN CM Curly [place]) called the MD's office to arrange a follow-up appointment for November 12, 2016. I notified the nurse about patient stopping Eliquis. The nurse spoke with Dr. Nevada Crane. Dr. Nevada Crane gave orders to hold the Eliquis for another day and to resume on Sunday. Urgent care at his office would be open on Saturday and Sunday, if patient had any complications. Daughter given this information, per patient's request.   Plan: RN CM will send referral to [facility] RN for further in home eval/assessment of care needs and management of chronic conditions. RN CM advised patient to contact RNCM for any needs or concerns.  [name], RN, BSN, MHA/MSL, [place] Telephonic Care Manager Coordinator [facility] Direct Phone: [phone] Toll Free: [phone] Fax: [phone]

## 2016-11-11 NOTE — Patient Outreach (Signed)
[facility]) Care Management  11/11/2016  [name] 01-Mar-1934 [id number]   EMMI-Stroke RED ON EMMI ALERT DAY#:3 DATE: 11/07/16 RED ALERT: Questions/problems with meds? Yes  Outreach attempt # 2 spoke with patient regarding EMMI questionnaire. HIPAA verified with patient.  Patient reported having questions/problems with medications. Patient was discharged from the hospital on 11/04/16. She was discharged home on Eliquis for a recent CVA. Patient verbalized stopping the Eliquis the day after discharged. She noticed blood in her Foley catheter, which is chronic. Patient did not report stopping the medication to her PCP. Patient's daughter attempted to make a discharge appointment with patient's PCP. She was given an appointment scheduled 3 weeks after discharge. Per daughter, patient appears to be for forgetful and agitated after having a stroke. Patient reported, "she was in the bed, because she didn't feel well". The daughter stated, hjer mother became upset with her caregiver about money. Patient is active with [facility] (PT/OT/RN/CNA). The plan is to have a ST in addition to the other services. RN CM Curly [place]) called the MD's office to arrange a follow-up appointment for November 12, 2016.  [name], RN, BSN, MHA/MSL, [place] Telephonic Care Manager Coordinator [facility] Direct Phone: [phone] Toll Free: [phone] Fax: [phone]

## 2016-11-12 DIAGNOSIS — C7951 Secondary malignant neoplasm of bone: Secondary | ICD-10-CM | POA: Diagnosis not present

## 2016-11-12 DIAGNOSIS — Z87440 Personal history of urinary (tract) infections: Secondary | ICD-10-CM | POA: Diagnosis not present

## 2016-11-12 DIAGNOSIS — I639 Cerebral infarction, unspecified: Secondary | ICD-10-CM | POA: Diagnosis not present

## 2016-11-12 DIAGNOSIS — G8311 Monoplegia of lower limb affecting right dominant side: Secondary | ICD-10-CM | POA: Diagnosis not present

## 2016-11-12 DIAGNOSIS — I2510 Atherosclerotic heart disease of native coronary artery without angina pectoris: Secondary | ICD-10-CM | POA: Diagnosis not present

## 2016-11-12 DIAGNOSIS — I130 Hypertensive heart and chronic kidney disease with heart failure and stage 1 through stage 4 chronic kidney disease, or unspecified chronic kidney disease: Secondary | ICD-10-CM | POA: Diagnosis not present

## 2016-11-12 DIAGNOSIS — C3490 Malignant neoplasm of unspecified part of unspecified bronchus or lung: Secondary | ICD-10-CM | POA: Diagnosis not present

## 2016-11-12 DIAGNOSIS — N183 Chronic kidney disease, stage 3 (moderate): Secondary | ICD-10-CM | POA: Diagnosis not present

## 2016-11-12 DIAGNOSIS — N390 Urinary tract infection, site not specified: Secondary | ICD-10-CM | POA: Diagnosis not present

## 2016-11-12 DIAGNOSIS — I5032 Chronic diastolic (congestive) heart failure: Secondary | ICD-10-CM | POA: Diagnosis not present

## 2016-11-12 DIAGNOSIS — Z86718 Personal history of other venous thrombosis and embolism: Secondary | ICD-10-CM | POA: Diagnosis not present

## 2016-11-12 DIAGNOSIS — Z466 Encounter for fitting and adjustment of urinary device: Secondary | ICD-10-CM | POA: Diagnosis not present

## 2016-11-13 DIAGNOSIS — C7951 Secondary malignant neoplasm of bone: Secondary | ICD-10-CM | POA: Diagnosis not present

## 2016-11-13 DIAGNOSIS — G8311 Monoplegia of lower limb affecting right dominant side: Secondary | ICD-10-CM | POA: Diagnosis not present

## 2016-11-13 DIAGNOSIS — Z466 Encounter for fitting and adjustment of urinary device: Secondary | ICD-10-CM | POA: Diagnosis not present

## 2016-11-13 DIAGNOSIS — I5032 Chronic diastolic (congestive) heart failure: Secondary | ICD-10-CM | POA: Diagnosis not present

## 2016-11-13 DIAGNOSIS — N183 Chronic kidney disease, stage 3 (moderate): Secondary | ICD-10-CM | POA: Diagnosis not present

## 2016-11-13 DIAGNOSIS — Z86718 Personal history of other venous thrombosis and embolism: Secondary | ICD-10-CM | POA: Diagnosis not present

## 2016-11-13 DIAGNOSIS — C3490 Malignant neoplasm of unspecified part of unspecified bronchus or lung: Secondary | ICD-10-CM | POA: Diagnosis not present

## 2016-11-13 DIAGNOSIS — I130 Hypertensive heart and chronic kidney disease with heart failure and stage 1 through stage 4 chronic kidney disease, or unspecified chronic kidney disease: Secondary | ICD-10-CM | POA: Diagnosis not present

## 2016-11-13 DIAGNOSIS — I2510 Atherosclerotic heart disease of native coronary artery without angina pectoris: Secondary | ICD-10-CM | POA: Diagnosis not present

## 2016-11-13 DIAGNOSIS — Z87440 Personal history of urinary (tract) infections: Secondary | ICD-10-CM | POA: Diagnosis not present

## 2016-11-14 ENCOUNTER — Telehealth: Payer: Self-pay | Admitting: Emergency Medicine

## 2016-11-14 NOTE — Telephone Encounter (Signed)
Patient's daughter called and left a voicemail requesting a call back from Dr Julien Nordmann. [name] states she would like to discuss her mother's condition with Dr Julien Nordmann; states she has heard different opinions from other doctors and she would like to speak with Dr Julien Nordmann about that. Patient is scheduled to see Dr Julien Nordmann on 6/18; called patient's daughter to see if they would be coming to that appointment; no answer.

## 2016-11-15 DIAGNOSIS — N183 Chronic kidney disease, stage 3 (moderate): Secondary | ICD-10-CM | POA: Diagnosis not present

## 2016-11-15 DIAGNOSIS — G8311 Monoplegia of lower limb affecting right dominant side: Secondary | ICD-10-CM | POA: Diagnosis not present

## 2016-11-15 DIAGNOSIS — Z87440 Personal history of urinary (tract) infections: Secondary | ICD-10-CM | POA: Diagnosis not present

## 2016-11-15 DIAGNOSIS — Z86718 Personal history of other venous thrombosis and embolism: Secondary | ICD-10-CM | POA: Diagnosis not present

## 2016-11-15 DIAGNOSIS — I2510 Atherosclerotic heart disease of native coronary artery without angina pectoris: Secondary | ICD-10-CM | POA: Diagnosis not present

## 2016-11-15 DIAGNOSIS — I5032 Chronic diastolic (congestive) heart failure: Secondary | ICD-10-CM | POA: Diagnosis not present

## 2016-11-15 DIAGNOSIS — C3490 Malignant neoplasm of unspecified part of unspecified bronchus or lung: Secondary | ICD-10-CM | POA: Diagnosis not present

## 2016-11-15 DIAGNOSIS — Z466 Encounter for fitting and adjustment of urinary device: Secondary | ICD-10-CM | POA: Diagnosis not present

## 2016-11-15 DIAGNOSIS — I130 Hypertensive heart and chronic kidney disease with heart failure and stage 1 through stage 4 chronic kidney disease, or unspecified chronic kidney disease: Secondary | ICD-10-CM | POA: Diagnosis not present

## 2016-11-15 DIAGNOSIS — C7951 Secondary malignant neoplasm of bone: Secondary | ICD-10-CM | POA: Diagnosis not present

## 2016-11-18 ENCOUNTER — Telehealth: Payer: Self-pay | Admitting: Internal Medicine

## 2016-11-18 ENCOUNTER — Ambulatory Visit (HOSPITAL_BASED_OUTPATIENT_CLINIC_OR_DEPARTMENT_OTHER): Payer: Medicare Other | Admitting: Internal Medicine

## 2016-11-18 ENCOUNTER — Other Ambulatory Visit: Payer: Self-pay | Admitting: Internal Medicine

## 2016-11-18 ENCOUNTER — Ambulatory Visit (HOSPITAL_BASED_OUTPATIENT_CLINIC_OR_DEPARTMENT_OTHER): Payer: Medicare Other

## 2016-11-18 ENCOUNTER — Other Ambulatory Visit (HOSPITAL_BASED_OUTPATIENT_CLINIC_OR_DEPARTMENT_OTHER): Payer: Medicare Other

## 2016-11-18 ENCOUNTER — Encounter: Payer: Self-pay | Admitting: Internal Medicine

## 2016-11-18 VITALS — BP 144/58 | HR 64 | Resp 18 | Ht 68.0 in | Wt 121.2 lb

## 2016-11-18 DIAGNOSIS — Z85118 Personal history of other malignant neoplasm of bronchus and lung: Secondary | ICD-10-CM

## 2016-11-18 DIAGNOSIS — Z5112 Encounter for antineoplastic immunotherapy: Secondary | ICD-10-CM

## 2016-11-18 DIAGNOSIS — C7951 Secondary malignant neoplasm of bone: Secondary | ICD-10-CM

## 2016-11-18 DIAGNOSIS — C3490 Malignant neoplasm of unspecified part of unspecified bronchus or lung: Secondary | ICD-10-CM | POA: Diagnosis not present

## 2016-11-18 DIAGNOSIS — N179 Acute kidney failure, unspecified: Secondary | ICD-10-CM

## 2016-11-18 DIAGNOSIS — Z95828 Presence of other vascular implants and grafts: Secondary | ICD-10-CM

## 2016-11-18 DIAGNOSIS — N189 Chronic kidney disease, unspecified: Secondary | ICD-10-CM

## 2016-11-18 DIAGNOSIS — Z79899 Other long term (current) drug therapy: Secondary | ICD-10-CM

## 2016-11-18 DIAGNOSIS — D638 Anemia in other chronic diseases classified elsewhere: Secondary | ICD-10-CM

## 2016-11-18 DIAGNOSIS — I639 Cerebral infarction, unspecified: Secondary | ICD-10-CM

## 2016-11-18 DIAGNOSIS — N183 Chronic kidney disease, stage 3 (moderate): Secondary | ICD-10-CM

## 2016-11-18 LAB — COMPREHENSIVE METABOLIC PANEL
ALBUMIN: 3.3 g/dL — AB (ref 3.5–5.0)
ALK PHOS: 130 U/L (ref 40–150)
ALT: 8 U/L (ref 0–55)
ANION GAP: 11 meq/L (ref 3–11)
AST: 16 U/L (ref 5–34)
BILIRUBIN TOTAL: 0.3 mg/dL (ref 0.20–1.20)
BUN: 48.9 mg/dL — ABNORMAL HIGH (ref 7.0–26.0)
CO2: 19 mEq/L — ABNORMAL LOW (ref 22–29)
Calcium: 8.7 mg/dL (ref 8.4–10.4)
Chloride: 108 mEq/L (ref 98–109)
Creatinine: 4.1 mg/dL (ref 0.6–1.1)
EGFR: 10 mL/min/{1.73_m2} — AB (ref >90)
Glucose: 94 mg/dl (ref 70–140)
Potassium: 5.8 mEq/L — ABNORMAL HIGH (ref 3.5–5.1)
Sodium: 137 mEq/L (ref 136–145)
TOTAL PROTEIN: 7 g/dL (ref 6.4–8.3)

## 2016-11-18 LAB — CBC WITH DIFFERENTIAL/PLATELET
BASO%: 0.7 % (ref 0.0–2.0)
BASOS ABS: 0 10*3/uL (ref 0.0–0.1)
EOS ABS: 0.2 10*3/uL (ref 0.0–0.5)
EOS%: 4.2 % (ref 0.0–7.0)
HCT: 32.3 % — ABNORMAL LOW (ref 34.8–46.6)
HEMOGLOBIN: 10.4 g/dL — AB (ref 11.6–15.9)
LYMPH#: 0.8 10*3/uL — AB (ref 0.9–3.3)
LYMPH%: 17.6 % (ref 14.0–49.7)
MCH: 28.5 pg (ref 25.1–34.0)
MCHC: 32.2 g/dL (ref 31.5–36.0)
MCV: 88.5 fL (ref 79.5–101.0)
MONO#: 0.6 10*3/uL (ref 0.1–0.9)
MONO%: 13.8 % (ref 0.0–14.0)
NEUT#: 2.9 10*3/uL (ref 1.5–6.5)
NEUT%: 63.7 % (ref 38.4–76.8)
NRBC: 0 % (ref 0–0)
PLATELETS: 200 10*3/uL (ref 145–400)
RBC: 3.65 10*6/uL — ABNORMAL LOW (ref 3.70–5.45)
RDW: 14.6 % — AB (ref 11.2–14.5)
WBC: 4.5 10*3/uL (ref 3.9–10.3)

## 2016-11-18 LAB — TSH: TSH: 5.581 m(IU)/L — ABNORMAL HIGH (ref 0.308–3.960)

## 2016-11-18 MED ORDER — HEPARIN SOD (PORK) LOCK FLUSH 100 UNIT/ML IV SOLN
500.0000 [IU] | Freq: Once | INTRAVENOUS | Status: AC | PRN
  Administered 2016-11-18: 500 [IU]
  Filled 2016-11-18: qty 5

## 2016-11-18 MED ORDER — SODIUM CHLORIDE 0.9% FLUSH
10.0000 mL | INTRAVENOUS | Status: DC | PRN
  Administered 2016-11-18: 10 mL
  Filled 2016-11-18: qty 10

## 2016-11-18 MED ORDER — PEMBROLIZUMAB CHEMO INJECTION 100 MG/4ML
200.0000 mg | Freq: Once | INTRAVENOUS | Status: AC
  Administered 2016-11-18: 200 mg via INTRAVENOUS
  Filled 2016-11-18: qty 8

## 2016-11-18 MED ORDER — SODIUM CHLORIDE 0.9 % IV SOLN
Freq: Once | INTRAVENOUS | Status: AC
  Administered 2016-11-18: 11:00:00 via INTRAVENOUS

## 2016-11-18 MED ORDER — SODIUM CHLORIDE 0.9% FLUSH
10.0000 mL | INTRAVENOUS | Status: DC | PRN
  Filled 2016-11-18: qty 10

## 2016-11-18 NOTE — Progress Notes (Signed)
Per Dr Julien Nordmann it is okay to treat pt today with Keytruda and cmp results.

## 2016-11-18 NOTE — Patient Instructions (Signed)
[facility] Discharge Instructions for Patients Receiving Chemotherapy  Today you received the following chemotherapy agents pembrolizumab Beryle Flock).  To help prevent nausea and vomiting after your treatment, we encourage you to take your nausea medication as directed by your doctor.   If you develop nausea and vomiting that is not controlled by your nausea medication, call the clinic.   BELOW ARE SYMPTOMS THAT SHOULD BE REPORTED IMMEDIATELY:  *FEVER GREATER THAN 100.5 F  *CHILLS WITH OR WITHOUT FEVER  NAUSEA AND VOMITING THAT IS NOT CONTROLLED WITH YOUR NAUSEA MEDICATION  *UNUSUAL SHORTNESS OF BREATH  *UNUSUAL BRUISING OR BLEEDING  TENDERNESS IN MOUTH AND THROAT WITH OR WITHOUT PRESENCE OF ULCERS  *URINARY PROBLEMS  *BOWEL PROBLEMS  UNUSUAL RASH Items with * indicate a potential emergency and should be followed up as soon as possible.  Feel free to call the clinic you have any questions or concerns. The clinic phone number is [phone].  Please show the [place] at check-in to the Emergency Department and triage nurse.

## 2016-11-18 NOTE — Telephone Encounter (Signed)
Scheduled appt per 6/18 los - Gave patient AVS and calender per LOS - [facility] to contact patient.

## 2016-11-18 NOTE — Progress Notes (Signed)
Per Diane, RN for Dr. Julien Nordmann, ok to treat with Creat of 4.1 and K of 5.8.

## 2016-11-18 NOTE — Progress Notes (Signed)
[place] [phone]   [phone]  OFFICE PROGRESS NOTE  Celene Squibb, MD [place] [address]  PRINCIPAL DIAGNOSIS: Stage IV non-small cell lung cancer diagnosed in March 2007, with disease recurrence in June 2016 with positive PDL 1 expression (50%).   PRIOR THERAPY:  1) Status post 6 cycles of systemic chemotherapy with carboplatin and docetaxel. Last dose was given January 16, 2006.  2) Palliative radiotherapy to the large right paraspinous mass with osseous invasion centered at L5 under the care of Dr. Valere Dross. 3) Systemic chemotherapy with carboplatin for AUC of 5 and paclitaxel 175 MG/M2 every 3 weeks with Neulasta support. First dose expected on 02/02/2015. Status post 2 cycles, last dose was given 02/23/2015 discontinued secondary to intolerance and mild disease progression. 4) Ketruda 200 mg IV every 3 weeks. First dose 04/14/2015. Status post 3 cycles. Treatment was discontinued after the patient had several hospitalization with recurrent infection and back abscess but were unrelated to her treatment.  CURRENT THERAPY: Retreatment with Ketruda 200 mg IV every 3 weeks status post 2 cycles.  INTERVAL HISTORY: [name] 83 y.o. female returns to the clinic today for follow-up visit accompanied by her daughter. The patient was recently diagnosed with a stroke and she is currently on Eliquis and Plavix. She is feeling much better. She continues to have shortness of breath with exertion. She denied having any chest pain, cough or hemoptysis. She has no nausea, vomiting, diarrhea or constipation. She has no fever or chills. She is here today for reevaluation before resuming her treatment.  MEDICAL HISTORY: Past Medical History:  Diagnosis Date  . A-fib ([place])   . Chronic anemia   . Chronic diastolic (congestive) heart failure ([place])   . Chronic fatigue 04/04/2015  . Chronic fatigue 04/04/2015  . CKD (chronic kidney  disease)   . Coronary artery disease    a.  LHC (06/04/05): LHC done in [place] with high grade RCA => s/p BMS to RCA;  b.  Nuclear (09/14/09): Lexiscan; Inf infarct with mild peri-infarct ishemia, EF 52%; Low Risk.  Marland Kitchen DVT (deep venous thrombosis) ([place])   . Foot drop, right   . GERD (gastroesophageal reflux disease)   . Goals of care, counseling/discussion 09/30/2016  . Hypercholesterolemia   . Hypertension   . Ischemic cardiomyopathy    a. Echo (07/26/13): Mild LVH, EF 35-40%, diff HK, inf AK, Gr 2 DD, Tr AI, mildly dilated Ao root, MAC, mild MR, mild LAE, mod reduced RVSF.  . Non-small cell carcinoma of lung ([place])    Stage IV;spinal cancer; had chemo and radiation and immune therapy    ALLERGIES:  is allergic to amlodipine; ciprofloxacin; mirtazapine; statins; aspirin; cephalexin; hydralazine; iron; ambien [zolpidem tartrate]; lorazepam; sulfonamide derivatives; zolpidem; latex; penicillins; shellfish allergy; and tape.  MEDICATIONS:  Current Outpatient Prescriptions  Medication Sig Dispense Refill  . acetaminophen (TYLENOL) 500 MG tablet Take 500 mg by mouth daily as needed for mild pain.    Marland Kitchen apixaban (ELIQUIS) 2.5 MG TABS tablet Take 1 tablet (2.5 mg total) by mouth 2 (two) times daily. 60 tablet 0  . diazepam (VALIUM) 2 MG tablet Take 1 tablet (2 mg total) by mouth See admin instructions. Take 1 tab 1 hour prior to MRI. May repeat X1 after 1 hour if not effective. 2 tablet 0  . diphenoxylate-atropine (LOMOTIL) 2.5-0.025 MG tablet Take 1 tablet by mouth 4 (four) times daily as needed for diarrhea or loose  stools. Reported on 11/28/2015    . furosemide (LASIX) 40 MG tablet Take 40 mg by mouth daily as needed for fluid or edema.     Marland Kitchen loperamide (IMODIUM) 2 MG capsule Take 1 capsule (2 mg total) by mouth every 6 (six) hours as needed for diarrhea or loose stools. 30 capsule 0  . metoprolol tartrate (LOPRESSOR) 25 MG tablet Take 0.5 tablets (12.5 mg total) by mouth at bedtime. 30  tablet 0  . pantoprazole (PROTONIX) 40 MG tablet Take 40 mg by mouth daily before breakfast.     . Pembrolizumab (KEYTRUDA IV) Inject into the vein every 21 ( twenty-one) days.    Marland Kitchen senna-docusate (SENOKOT-S) 8.6-50 MG tablet Take 1 tablet by mouth at bedtime as needed for mild constipation. 30 tablet 0   No current facility-administered medications for this visit.    Facility-Administered Medications Ordered in Other Visits  Medication Dose Route Frequency Provider Last Rate Last Dose  . heparin lock flush 100 unit/mL  500 Units Intracatheter Once PRN Curt Bears, MD      . sodium chloride flush (NS) 0.9 % injection 10 mL  10 mL Intracatheter PRN Curt Bears, MD        SURGICAL HISTORY:  Past Surgical History:  Procedure Laterality Date  . ABDOMINAL HYSTERECTOMY    . APPENDECTOMY    . CATARACT EXTRACTION Bilateral   . CHOLECYSTECTOMY  2011  . CYSTOSCOPY     with stent placement  . CYSTOSCOPY W/ URETERAL STENT PLACEMENT Right 12/12/2015   Procedure: CYSTOSCOPY WITH RETROGRADE PYELOGRAM WITH STENT EXCHANGE;  Surgeon: Franchot Gallo, MD;  Location: [facility];  Service: Urology;  Laterality: Right;  . ERCP  2011   with stone extraction, done same day as cholecystectomy  . HEMATOMA EVACUATION  December 2006   groin  . HEMORROIDECTOMY    . LAMINECTOMY    . PORTACATH PLACEMENT    . TONSILLECTOMY      REVIEW OF SYSTEMS:  Constitutional: positive for fatigue Eyes: negative Ears, nose, mouth, throat, and face: negative Respiratory: positive for dyspnea on exertion Cardiovascular: negative Gastrointestinal: negative Genitourinary:negative Integument/breast: negative Hematologic/lymphatic: negative Musculoskeletal:positive for muscle weakness Neurological: negative Behavioral/Psych: negative Endocrine: negative Allergic/Immunologic: negative   PHYSICAL EXAMINATION: General appearance: alert, cooperative, fatigued and no distress Head: Normocephalic, without obvious  abnormality, atraumatic Neck: no adenopathy Lymph nodes: Cervical, supraclavicular, and axillary nodes normal. Resp: clear to auscultation bilaterally Back: symmetric, no curvature. ROM normal. No CVA tenderness. Cardio: regular rate and rhythm, S1, S2 normal, no murmur, click, rub or gallop GI: soft, non-tender; bowel sounds normal; no masses,  no organomegaly Extremities: extremities normal, atraumatic, no cyanosis or edema Neurologic: Alert and oriented X 3, normal strength and tone. Normal symmetric reflexes. Normal coordination and gait  ECOG PERFORMANCE STATUS: 2 - Symptomatic, <50% confined to bed  Blood pressure (!) 144/58, pulse 64, resp. rate 18, height _0  (1.727 m), weight 121 lb 3.2 oz (55 kg), SpO2 98 %.  LABORATORY DATA: Lab Results  Component Value Date   WBC 3.9 (L) 11/03/2016   HGB 9.4 (L) 11/03/2016   HCT 30.1 (L) 11/03/2016   MCV 88.3 11/03/2016   PLT 169 11/03/2016      Chemistry      Component Value Date/Time   NA 137 11/03/2016 0426   NA 141 10/29/2016 0823   K 4.2 11/03/2016 0426   K 4.8 10/29/2016 0823   CL 113 (H) 11/03/2016 0426   CL 104 06/19/2012 0958   CO2 19 (  L) 11/03/2016 0426   CO2 17 (L) 10/29/2016 0823   BUN 45 (H) 11/03/2016 0426   BUN 64.2 (H) 10/29/2016 0823   CREATININE 3.63 (H) 11/03/2016 0426   CREATININE 3.8 (HH) 10/29/2016 0823      Component Value Date/Time   CALCIUM 8.2 (L) 11/03/2016 0426   CALCIUM 8.9 10/29/2016 0823   ALKPHOS 118 10/30/2016 1414   ALKPHOS 130 10/29/2016 0823   AST 15 10/30/2016 1414   AST 11 10/29/2016 0823   ALT 8 (L) 10/30/2016 1414   ALT 6 10/29/2016 0823   BILITOT 0.6 10/30/2016 1414   BILITOT 0.57 10/29/2016 0823       RADIOGRAPHIC STUDIES: Mr Brain Wo Contrast  Result Date: 10/30/2016 CLINICAL DATA:  Non-small cell lung cancer. Stage IV Squamous cell carcinoma with metastatic disease to the bone. Confusion. Cough EXAM: MRI HEAD WITHOUT CONTRAST TECHNIQUE: Multiplanar, multiecho pulse  sequences of the brain and surrounding structures were obtained without intravenous contrast. COMPARISON:  MRI the brain 11/28/2014 FINDINGS: Brain: Diffusion-weighted images demonstrated a prominent left frontal acute/subacute nonhemorrhagic infarct. A smaller area of acute/subacute nonhemorrhagic infarction is present in the left parietal lobe. Advanced white matter disease is present with multiple remote lacunar infarcts noted in the basal ganglia bilaterally. Dilated perivascular spaces are also present in the basal ganglia. White matter changes extend into the brainstem. Remote lacunar infarcts are present within the cerebellum bilaterally. Although contrast was not given, no definite mass lesion is present to suggest metastatic disease. Arachnoid cysts over the convexity are stable bilaterally. Vascular: Flow is present in the major intracranial arteries. Skull and upper cervical spine: Skullbase is within normal limits. No discrete osseous lesions are evident. The craniocervical junction is within normal limits. Sinuses/Orbits: The paranasal sinuses are clear. Bilateral lens replacements are noted. IMPRESSION: 1. Acute/subacute cortical and subcortical infarct involving the anterior left frontal lobe along the middle frontal gyrus. 2. Acute/subacute cortical infarct involving the left parietal lobe, potentially along the watershed territory. 3. Advanced atrophy and diffuse white matter disease compatible with chronic microvascular ischemic changes. 4. Remote lacunar infarcts involving the basal ganglia bilaterally. 5. Diffuse white matter disease into the brainstem with remote lacunar infarcts in the cerebellum bilaterally, right greater than left. These results were called by telephone at the time of interpretation on 10/30/2016 at 1:40 pm to Dr. Curt Bears, who verbally acknowledged these results. At the request of Dr. Julien Nordmann, the patient was directed to the [facility] Emergency Department for  evaluation of the acute infarct and admission if appropriate. Electronically Signed   By: San Morelle M.D.   On: 10/30/2016 13:43    ASSESSMENT AND PLAN:  This is a very pleasant 83 years old white female with a stage IV non-small cell lung cancer initially diagnosed in March 2007 status post systemic chemotherapy with carboplatin and docetaxel for 6 cycles and she was followed by observation for 9 years until the patient developed disease progression in the lower thoracic and lumbar spines. She was treated with palliative radiotherapy followed by 2 cycles of systemic chemotherapy was carboplatin and paclitaxel discontinued secondary to intolerance. She is currently undergoing treatment with [place] every 3 weeks status post 2 cycles of systemic course of this treatment. Her treatment was interrupted recently secondary to a stroke. The patient is feeling much better and I recommended for her to proceed with cycle #3 today. I would see her back for follow-up visit in 3 weeks for reevaluation and repeat CT scan of the chest, abdomen and  pelvis without contrast for restaging of her disease. For the recent stroke, she will continue her treatment with any questions and Plavix. For the renal insufficiency, she will continue her care under nephrology. The patient was advised to call immediately if she has any concerning symptoms in the interval. All questions were answered. The patient knows to call the clinic with any problems, questions or concerns. We can certainly see the patient much sooner if necessary.  Disclaimer: This note was dictated with voice recognition software. Similar sounding words can inadvertently be transcribed and may not be corrected upon review.

## 2016-11-18 NOTE — Patient Outreach (Signed)
[facility]) Care Management  11/18/2016  CHUDNEY SCHEFFLER 1934/04/10 [id number]  Unable to reach Mrs. Wessner by phone today for continued follow up in transition of care series.   Plan: I will reach out to Mrs. Kleman again this week.    [facility]  [phone]

## 2016-11-19 ENCOUNTER — Other Ambulatory Visit: Payer: Self-pay | Admitting: *Deleted

## 2016-11-19 DIAGNOSIS — C7951 Secondary malignant neoplasm of bone: Secondary | ICD-10-CM | POA: Diagnosis not present

## 2016-11-19 DIAGNOSIS — Z87440 Personal history of urinary (tract) infections: Secondary | ICD-10-CM | POA: Diagnosis not present

## 2016-11-19 DIAGNOSIS — I2510 Atherosclerotic heart disease of native coronary artery without angina pectoris: Secondary | ICD-10-CM | POA: Diagnosis not present

## 2016-11-19 DIAGNOSIS — C3490 Malignant neoplasm of unspecified part of unspecified bronchus or lung: Secondary | ICD-10-CM | POA: Diagnosis not present

## 2016-11-19 DIAGNOSIS — I5032 Chronic diastolic (congestive) heart failure: Secondary | ICD-10-CM | POA: Diagnosis not present

## 2016-11-19 DIAGNOSIS — G8311 Monoplegia of lower limb affecting right dominant side: Secondary | ICD-10-CM | POA: Diagnosis not present

## 2016-11-19 DIAGNOSIS — I130 Hypertensive heart and chronic kidney disease with heart failure and stage 1 through stage 4 chronic kidney disease, or unspecified chronic kidney disease: Secondary | ICD-10-CM | POA: Diagnosis not present

## 2016-11-19 DIAGNOSIS — N183 Chronic kidney disease, stage 3 (moderate): Secondary | ICD-10-CM | POA: Diagnosis not present

## 2016-11-19 DIAGNOSIS — Z86718 Personal history of other venous thrombosis and embolism: Secondary | ICD-10-CM | POA: Diagnosis not present

## 2016-11-19 DIAGNOSIS — Z466 Encounter for fitting and adjustment of urinary device: Secondary | ICD-10-CM | POA: Diagnosis not present

## 2016-11-20 DIAGNOSIS — Z87440 Personal history of urinary (tract) infections: Secondary | ICD-10-CM | POA: Diagnosis not present

## 2016-11-20 DIAGNOSIS — I5032 Chronic diastolic (congestive) heart failure: Secondary | ICD-10-CM | POA: Diagnosis not present

## 2016-11-20 DIAGNOSIS — Z466 Encounter for fitting and adjustment of urinary device: Secondary | ICD-10-CM | POA: Diagnosis not present

## 2016-11-20 DIAGNOSIS — G8311 Monoplegia of lower limb affecting right dominant side: Secondary | ICD-10-CM | POA: Diagnosis not present

## 2016-11-20 DIAGNOSIS — N183 Chronic kidney disease, stage 3 (moderate): Secondary | ICD-10-CM | POA: Diagnosis not present

## 2016-11-20 DIAGNOSIS — I2510 Atherosclerotic heart disease of native coronary artery without angina pectoris: Secondary | ICD-10-CM | POA: Diagnosis not present

## 2016-11-20 DIAGNOSIS — Z86718 Personal history of other venous thrombosis and embolism: Secondary | ICD-10-CM | POA: Diagnosis not present

## 2016-11-20 DIAGNOSIS — C3490 Malignant neoplasm of unspecified part of unspecified bronchus or lung: Secondary | ICD-10-CM | POA: Diagnosis not present

## 2016-11-20 DIAGNOSIS — C7951 Secondary malignant neoplasm of bone: Secondary | ICD-10-CM | POA: Diagnosis not present

## 2016-11-20 DIAGNOSIS — I130 Hypertensive heart and chronic kidney disease with heart failure and stage 1 through stage 4 chronic kidney disease, or unspecified chronic kidney disease: Secondary | ICD-10-CM | POA: Diagnosis not present

## 2016-11-21 DIAGNOSIS — G8311 Monoplegia of lower limb affecting right dominant side: Secondary | ICD-10-CM | POA: Diagnosis not present

## 2016-11-21 DIAGNOSIS — Z466 Encounter for fitting and adjustment of urinary device: Secondary | ICD-10-CM | POA: Diagnosis not present

## 2016-11-21 DIAGNOSIS — C7951 Secondary malignant neoplasm of bone: Secondary | ICD-10-CM | POA: Diagnosis not present

## 2016-11-21 DIAGNOSIS — Z87440 Personal history of urinary (tract) infections: Secondary | ICD-10-CM | POA: Diagnosis not present

## 2016-11-21 DIAGNOSIS — I5032 Chronic diastolic (congestive) heart failure: Secondary | ICD-10-CM | POA: Diagnosis not present

## 2016-11-21 DIAGNOSIS — N183 Chronic kidney disease, stage 3 (moderate): Secondary | ICD-10-CM | POA: Diagnosis not present

## 2016-11-21 DIAGNOSIS — I130 Hypertensive heart and chronic kidney disease with heart failure and stage 1 through stage 4 chronic kidney disease, or unspecified chronic kidney disease: Secondary | ICD-10-CM | POA: Diagnosis not present

## 2016-11-21 DIAGNOSIS — I2510 Atherosclerotic heart disease of native coronary artery without angina pectoris: Secondary | ICD-10-CM | POA: Diagnosis not present

## 2016-11-21 DIAGNOSIS — C3490 Malignant neoplasm of unspecified part of unspecified bronchus or lung: Secondary | ICD-10-CM | POA: Diagnosis not present

## 2016-11-21 DIAGNOSIS — Z86718 Personal history of other venous thrombosis and embolism: Secondary | ICD-10-CM | POA: Diagnosis not present

## 2016-11-22 ENCOUNTER — Other Ambulatory Visit: Payer: Self-pay | Admitting: *Deleted

## 2016-11-22 DIAGNOSIS — N183 Chronic kidney disease, stage 3 (moderate): Secondary | ICD-10-CM | POA: Diagnosis not present

## 2016-11-22 DIAGNOSIS — I130 Hypertensive heart and chronic kidney disease with heart failure and stage 1 through stage 4 chronic kidney disease, or unspecified chronic kidney disease: Secondary | ICD-10-CM | POA: Diagnosis not present

## 2016-11-22 DIAGNOSIS — I5032 Chronic diastolic (congestive) heart failure: Secondary | ICD-10-CM | POA: Diagnosis not present

## 2016-11-22 DIAGNOSIS — Z86718 Personal history of other venous thrombosis and embolism: Secondary | ICD-10-CM | POA: Diagnosis not present

## 2016-11-22 DIAGNOSIS — C7951 Secondary malignant neoplasm of bone: Secondary | ICD-10-CM | POA: Diagnosis not present

## 2016-11-22 DIAGNOSIS — I2510 Atherosclerotic heart disease of native coronary artery without angina pectoris: Secondary | ICD-10-CM | POA: Diagnosis not present

## 2016-11-22 DIAGNOSIS — C3490 Malignant neoplasm of unspecified part of unspecified bronchus or lung: Secondary | ICD-10-CM | POA: Diagnosis not present

## 2016-11-22 DIAGNOSIS — Z466 Encounter for fitting and adjustment of urinary device: Secondary | ICD-10-CM | POA: Diagnosis not present

## 2016-11-22 DIAGNOSIS — Z87440 Personal history of urinary (tract) infections: Secondary | ICD-10-CM | POA: Diagnosis not present

## 2016-11-22 DIAGNOSIS — G8311 Monoplegia of lower limb affecting right dominant side: Secondary | ICD-10-CM | POA: Diagnosis not present

## 2016-11-22 NOTE — Patient Outreach (Signed)
[facility]) Care Management  11/22/2016  [name] 1933-06-12 [id number]   [name] is an 83 year old female primary care patient of Dr. Wende Neighbors with medical history which includes CVA, Stage IV lung cancer with mets to bone Curt Bears MD Oncologist), atrial fibrillation, history of multiple DVT's, chronic anemia, hyperlipidemia, chronic kidney disease stage 3, Ischemic cardiomyopathy and congestive heart failure.   [name] presented to the ED on 10/30/16 with difficulty with her speech and was discharged to home on 11/04/16. She was referred to [facility] after triggering Red on the [facility] Stroke Dashboard and was contacted by the telephonic case manager then referred to me for community engagement and transition of care.   Urinary Symptoms - When I spoke with [name] last week, she experienced hematuria after her hospital discharge for which her [facility] contacted her provider. [name] and I spoke today. She says her hematuria is cleared. She was advised to decrease her Eliquis to once daily and she has done so. She completed oral antibiotics as prescribed by Loma Sousa DNP @ the office of Dr. Wende Neighbors, PCP. She denies urinary symptoms.   CVA Rehabilitation - [name] confirms that she continues to receive the following stroke care and rehabilitation services from the following disciplines through [facility]: home health nurse, aide, speech therapy, physical therapy, occupational therapy.   DME Needs - [name] is 5'8" and says her walker is not tall enough. Her physical therapist extended it as far as it goes and [name] says that now she is standing straight up and the walker is no longer tall enough for her to use it properly. I reached out to [facility] requesting a return call from her physical therapist Heron Sabins so we could discuss [name]'s needs.   Cancer Care - [name] is well connected with cancer team and  is scheduled to see Dr. Julien Nordmann on 12/09/16 at which time she will have labs, port flush, and infusion.     Plan: [name] will continue to work with her home health rehab team.   [name] will call for new or worsened symptoms.   [name] will attend all upcoming scheduled appointments.   I will reach out to [name] by phone next week to follow up on her progress and to ensure that she received needed DME.    [facility]  [phone]

## 2016-11-25 DIAGNOSIS — I130 Hypertensive heart and chronic kidney disease with heart failure and stage 1 through stage 4 chronic kidney disease, or unspecified chronic kidney disease: Secondary | ICD-10-CM | POA: Diagnosis not present

## 2016-11-25 DIAGNOSIS — Z466 Encounter for fitting and adjustment of urinary device: Secondary | ICD-10-CM | POA: Diagnosis not present

## 2016-11-25 DIAGNOSIS — C7951 Secondary malignant neoplasm of bone: Secondary | ICD-10-CM | POA: Diagnosis not present

## 2016-11-25 DIAGNOSIS — C3490 Malignant neoplasm of unspecified part of unspecified bronchus or lung: Secondary | ICD-10-CM | POA: Diagnosis not present

## 2016-11-25 DIAGNOSIS — G8311 Monoplegia of lower limb affecting right dominant side: Secondary | ICD-10-CM | POA: Diagnosis not present

## 2016-11-25 DIAGNOSIS — Z86718 Personal history of other venous thrombosis and embolism: Secondary | ICD-10-CM | POA: Diagnosis not present

## 2016-11-25 DIAGNOSIS — N183 Chronic kidney disease, stage 3 (moderate): Secondary | ICD-10-CM | POA: Diagnosis not present

## 2016-11-25 DIAGNOSIS — Z87440 Personal history of urinary (tract) infections: Secondary | ICD-10-CM | POA: Diagnosis not present

## 2016-11-25 DIAGNOSIS — I2510 Atherosclerotic heart disease of native coronary artery without angina pectoris: Secondary | ICD-10-CM | POA: Diagnosis not present

## 2016-11-25 DIAGNOSIS — I5032 Chronic diastolic (congestive) heart failure: Secondary | ICD-10-CM | POA: Diagnosis not present

## 2016-11-26 ENCOUNTER — Other Ambulatory Visit: Payer: Self-pay | Admitting: *Deleted

## 2016-11-26 DIAGNOSIS — I5032 Chronic diastolic (congestive) heart failure: Secondary | ICD-10-CM | POA: Diagnosis not present

## 2016-11-26 DIAGNOSIS — I2510 Atherosclerotic heart disease of native coronary artery without angina pectoris: Secondary | ICD-10-CM | POA: Diagnosis not present

## 2016-11-26 DIAGNOSIS — Z86718 Personal history of other venous thrombosis and embolism: Secondary | ICD-10-CM | POA: Diagnosis not present

## 2016-11-26 DIAGNOSIS — C3490 Malignant neoplasm of unspecified part of unspecified bronchus or lung: Secondary | ICD-10-CM | POA: Diagnosis not present

## 2016-11-26 DIAGNOSIS — I130 Hypertensive heart and chronic kidney disease with heart failure and stage 1 through stage 4 chronic kidney disease, or unspecified chronic kidney disease: Secondary | ICD-10-CM | POA: Diagnosis not present

## 2016-11-26 DIAGNOSIS — G8311 Monoplegia of lower limb affecting right dominant side: Secondary | ICD-10-CM | POA: Diagnosis not present

## 2016-11-26 DIAGNOSIS — Z87440 Personal history of urinary (tract) infections: Secondary | ICD-10-CM | POA: Diagnosis not present

## 2016-11-26 DIAGNOSIS — Z466 Encounter for fitting and adjustment of urinary device: Secondary | ICD-10-CM | POA: Diagnosis not present

## 2016-11-26 DIAGNOSIS — N183 Chronic kidney disease, stage 3 (moderate): Secondary | ICD-10-CM | POA: Diagnosis not present

## 2016-11-26 DIAGNOSIS — C7951 Secondary malignant neoplasm of bone: Secondary | ICD-10-CM | POA: Diagnosis not present

## 2016-11-26 NOTE — Patient Outreach (Signed)
[facility]) Care Management  11/26/2016  [name] 1934-02-12 [id number]  [name] is an 83 year old female primary care patient of Dr. Wende Neighbors with medical history which includes CVA, Stage IV lung cancer with mets to bone Curt Bears MD Oncologist), atrial fibrillation, history of multiple DVT's, chronic anemia, hyperlipidemia, chronic kidney disease stage 3, Ischemic cardiomyopathy and congestive heart failure.   [name] presented to the ED on 10/30/16 with difficulty with her speech and was discharged to home on 11/04/16 after a hospital admission for stroke. She was referred to [facility] [facility] after triggering Red on the [facility] Stroke Dashboard and was contacted by the telephonic case manager then referred to me for community engagement and transition of care.   Urinary Symptoms - [name]'s urinary symptoms have resolved. She is taking Eliquis once daily. She has completed a course of oral antibiotics.    CVA Rehabilitation - [name] confirms that she continues to receive the following stroke care and rehabilitation services from the following disciplines through [facility] at [facility]: home health nurse, aide, speech therapy, physical therapy, occupational therapy.   DME Needs - [name] physical therapist adjusted her walker and she reports she is using it without difficulty. She continues to participate in home health PT and OT. She has not experienced a fall since we last spoke.    Cancer Care - [name] is well connected with cancer team and is scheduled to see Dr. Julien Nordmann on 12/09/16 at which time she will have labs, port flush, and infusion. She has no questions today about her cancer treatment plan of care.    Plan: [name] will continue to work with her home health rehab team.   [name] will call for new or worsened symptoms.   [name] will attend all upcoming scheduled appointments.   I will reach out to  [name] by phone next week to follow up on her progress and to continue transition of care services.     [facility]  [phone]

## 2016-11-27 DIAGNOSIS — I2510 Atherosclerotic heart disease of native coronary artery without angina pectoris: Secondary | ICD-10-CM | POA: Diagnosis not present

## 2016-11-27 DIAGNOSIS — I130 Hypertensive heart and chronic kidney disease with heart failure and stage 1 through stage 4 chronic kidney disease, or unspecified chronic kidney disease: Secondary | ICD-10-CM | POA: Diagnosis not present

## 2016-11-27 DIAGNOSIS — Z86718 Personal history of other venous thrombosis and embolism: Secondary | ICD-10-CM | POA: Diagnosis not present

## 2016-11-27 DIAGNOSIS — I5032 Chronic diastolic (congestive) heart failure: Secondary | ICD-10-CM | POA: Diagnosis not present

## 2016-11-27 DIAGNOSIS — Z466 Encounter for fitting and adjustment of urinary device: Secondary | ICD-10-CM | POA: Diagnosis not present

## 2016-11-27 DIAGNOSIS — C7951 Secondary malignant neoplasm of bone: Secondary | ICD-10-CM | POA: Diagnosis not present

## 2016-11-27 DIAGNOSIS — C3490 Malignant neoplasm of unspecified part of unspecified bronchus or lung: Secondary | ICD-10-CM | POA: Diagnosis not present

## 2016-11-27 DIAGNOSIS — G8311 Monoplegia of lower limb affecting right dominant side: Secondary | ICD-10-CM | POA: Diagnosis not present

## 2016-11-27 DIAGNOSIS — N183 Chronic kidney disease, stage 3 (moderate): Secondary | ICD-10-CM | POA: Diagnosis not present

## 2016-11-27 DIAGNOSIS — Z87440 Personal history of urinary (tract) infections: Secondary | ICD-10-CM | POA: Diagnosis not present

## 2016-11-28 DIAGNOSIS — I5032 Chronic diastolic (congestive) heart failure: Secondary | ICD-10-CM | POA: Diagnosis not present

## 2016-11-28 DIAGNOSIS — Z86718 Personal history of other venous thrombosis and embolism: Secondary | ICD-10-CM | POA: Diagnosis not present

## 2016-11-28 DIAGNOSIS — N183 Chronic kidney disease, stage 3 (moderate): Secondary | ICD-10-CM | POA: Diagnosis not present

## 2016-11-28 DIAGNOSIS — C3490 Malignant neoplasm of unspecified part of unspecified bronchus or lung: Secondary | ICD-10-CM | POA: Diagnosis not present

## 2016-11-28 DIAGNOSIS — C7951 Secondary malignant neoplasm of bone: Secondary | ICD-10-CM | POA: Diagnosis not present

## 2016-11-28 DIAGNOSIS — Z466 Encounter for fitting and adjustment of urinary device: Secondary | ICD-10-CM | POA: Diagnosis not present

## 2016-11-28 DIAGNOSIS — I2510 Atherosclerotic heart disease of native coronary artery without angina pectoris: Secondary | ICD-10-CM | POA: Diagnosis not present

## 2016-11-28 DIAGNOSIS — G8311 Monoplegia of lower limb affecting right dominant side: Secondary | ICD-10-CM | POA: Diagnosis not present

## 2016-11-28 DIAGNOSIS — I130 Hypertensive heart and chronic kidney disease with heart failure and stage 1 through stage 4 chronic kidney disease, or unspecified chronic kidney disease: Secondary | ICD-10-CM | POA: Diagnosis not present

## 2016-11-28 DIAGNOSIS — Z87440 Personal history of urinary (tract) infections: Secondary | ICD-10-CM | POA: Diagnosis not present

## 2016-11-29 DIAGNOSIS — C7951 Secondary malignant neoplasm of bone: Secondary | ICD-10-CM | POA: Diagnosis not present

## 2016-11-29 DIAGNOSIS — Z87440 Personal history of urinary (tract) infections: Secondary | ICD-10-CM | POA: Diagnosis not present

## 2016-11-29 DIAGNOSIS — I2510 Atherosclerotic heart disease of native coronary artery without angina pectoris: Secondary | ICD-10-CM | POA: Diagnosis not present

## 2016-11-29 DIAGNOSIS — Z86718 Personal history of other venous thrombosis and embolism: Secondary | ICD-10-CM | POA: Diagnosis not present

## 2016-11-29 DIAGNOSIS — N183 Chronic kidney disease, stage 3 (moderate): Secondary | ICD-10-CM | POA: Diagnosis not present

## 2016-11-29 DIAGNOSIS — G8311 Monoplegia of lower limb affecting right dominant side: Secondary | ICD-10-CM | POA: Diagnosis not present

## 2016-11-29 DIAGNOSIS — I130 Hypertensive heart and chronic kidney disease with heart failure and stage 1 through stage 4 chronic kidney disease, or unspecified chronic kidney disease: Secondary | ICD-10-CM | POA: Diagnosis not present

## 2016-11-29 DIAGNOSIS — C3490 Malignant neoplasm of unspecified part of unspecified bronchus or lung: Secondary | ICD-10-CM | POA: Diagnosis not present

## 2016-11-29 DIAGNOSIS — I5032 Chronic diastolic (congestive) heart failure: Secondary | ICD-10-CM | POA: Diagnosis not present

## 2016-11-29 DIAGNOSIS — Z466 Encounter for fitting and adjustment of urinary device: Secondary | ICD-10-CM | POA: Diagnosis not present

## 2016-12-02 DIAGNOSIS — C3490 Malignant neoplasm of unspecified part of unspecified bronchus or lung: Secondary | ICD-10-CM | POA: Diagnosis not present

## 2016-12-02 DIAGNOSIS — Z466 Encounter for fitting and adjustment of urinary device: Secondary | ICD-10-CM | POA: Diagnosis not present

## 2016-12-02 DIAGNOSIS — I69320 Aphasia following cerebral infarction: Secondary | ICD-10-CM | POA: Diagnosis not present

## 2016-12-02 DIAGNOSIS — I5032 Chronic diastolic (congestive) heart failure: Secondary | ICD-10-CM | POA: Diagnosis not present

## 2016-12-02 DIAGNOSIS — I4891 Unspecified atrial fibrillation: Secondary | ICD-10-CM | POA: Diagnosis not present

## 2016-12-02 DIAGNOSIS — I255 Ischemic cardiomyopathy: Secondary | ICD-10-CM | POA: Diagnosis not present

## 2016-12-02 DIAGNOSIS — E7800 Pure hypercholesterolemia, unspecified: Secondary | ICD-10-CM | POA: Diagnosis not present

## 2016-12-02 DIAGNOSIS — D631 Anemia in chronic kidney disease: Secondary | ICD-10-CM | POA: Diagnosis not present

## 2016-12-02 DIAGNOSIS — I2510 Atherosclerotic heart disease of native coronary artery without angina pectoris: Secondary | ICD-10-CM | POA: Diagnosis not present

## 2016-12-02 DIAGNOSIS — G8311 Monoplegia of lower limb affecting right dominant side: Secondary | ICD-10-CM | POA: Diagnosis not present

## 2016-12-02 DIAGNOSIS — I130 Hypertensive heart and chronic kidney disease with heart failure and stage 1 through stage 4 chronic kidney disease, or unspecified chronic kidney disease: Secondary | ICD-10-CM | POA: Diagnosis not present

## 2016-12-02 DIAGNOSIS — N183 Chronic kidney disease, stage 3 (moderate): Secondary | ICD-10-CM | POA: Diagnosis not present

## 2016-12-02 DIAGNOSIS — C7951 Secondary malignant neoplasm of bone: Secondary | ICD-10-CM | POA: Diagnosis not present

## 2016-12-03 DIAGNOSIS — I130 Hypertensive heart and chronic kidney disease with heart failure and stage 1 through stage 4 chronic kidney disease, or unspecified chronic kidney disease: Secondary | ICD-10-CM | POA: Diagnosis not present

## 2016-12-03 DIAGNOSIS — C7951 Secondary malignant neoplasm of bone: Secondary | ICD-10-CM | POA: Diagnosis not present

## 2016-12-03 DIAGNOSIS — C3490 Malignant neoplasm of unspecified part of unspecified bronchus or lung: Secondary | ICD-10-CM | POA: Diagnosis not present

## 2016-12-03 DIAGNOSIS — I5032 Chronic diastolic (congestive) heart failure: Secondary | ICD-10-CM | POA: Diagnosis not present

## 2016-12-03 DIAGNOSIS — I2510 Atherosclerotic heart disease of native coronary artery without angina pectoris: Secondary | ICD-10-CM | POA: Diagnosis not present

## 2016-12-03 DIAGNOSIS — Z466 Encounter for fitting and adjustment of urinary device: Secondary | ICD-10-CM | POA: Diagnosis not present

## 2016-12-03 DIAGNOSIS — I4891 Unspecified atrial fibrillation: Secondary | ICD-10-CM | POA: Diagnosis not present

## 2016-12-03 DIAGNOSIS — G8311 Monoplegia of lower limb affecting right dominant side: Secondary | ICD-10-CM | POA: Diagnosis not present

## 2016-12-03 DIAGNOSIS — I255 Ischemic cardiomyopathy: Secondary | ICD-10-CM | POA: Diagnosis not present

## 2016-12-03 DIAGNOSIS — D631 Anemia in chronic kidney disease: Secondary | ICD-10-CM | POA: Diagnosis not present

## 2016-12-03 DIAGNOSIS — I69320 Aphasia following cerebral infarction: Secondary | ICD-10-CM | POA: Diagnosis not present

## 2016-12-03 DIAGNOSIS — N183 Chronic kidney disease, stage 3 (moderate): Secondary | ICD-10-CM | POA: Diagnosis not present

## 2016-12-03 DIAGNOSIS — E7800 Pure hypercholesterolemia, unspecified: Secondary | ICD-10-CM | POA: Diagnosis not present

## 2016-12-05 DIAGNOSIS — I2510 Atherosclerotic heart disease of native coronary artery without angina pectoris: Secondary | ICD-10-CM | POA: Diagnosis not present

## 2016-12-05 DIAGNOSIS — C7951 Secondary malignant neoplasm of bone: Secondary | ICD-10-CM | POA: Diagnosis not present

## 2016-12-05 DIAGNOSIS — G8311 Monoplegia of lower limb affecting right dominant side: Secondary | ICD-10-CM | POA: Diagnosis not present

## 2016-12-05 DIAGNOSIS — I69320 Aphasia following cerebral infarction: Secondary | ICD-10-CM | POA: Diagnosis not present

## 2016-12-05 DIAGNOSIS — I5032 Chronic diastolic (congestive) heart failure: Secondary | ICD-10-CM | POA: Diagnosis not present

## 2016-12-05 DIAGNOSIS — E7800 Pure hypercholesterolemia, unspecified: Secondary | ICD-10-CM | POA: Diagnosis not present

## 2016-12-05 DIAGNOSIS — D631 Anemia in chronic kidney disease: Secondary | ICD-10-CM | POA: Diagnosis not present

## 2016-12-05 DIAGNOSIS — Z466 Encounter for fitting and adjustment of urinary device: Secondary | ICD-10-CM | POA: Diagnosis not present

## 2016-12-05 DIAGNOSIS — I4891 Unspecified atrial fibrillation: Secondary | ICD-10-CM | POA: Diagnosis not present

## 2016-12-05 DIAGNOSIS — I255 Ischemic cardiomyopathy: Secondary | ICD-10-CM | POA: Diagnosis not present

## 2016-12-05 DIAGNOSIS — N183 Chronic kidney disease, stage 3 (moderate): Secondary | ICD-10-CM | POA: Diagnosis not present

## 2016-12-05 DIAGNOSIS — I130 Hypertensive heart and chronic kidney disease with heart failure and stage 1 through stage 4 chronic kidney disease, or unspecified chronic kidney disease: Secondary | ICD-10-CM | POA: Diagnosis not present

## 2016-12-05 DIAGNOSIS — C3490 Malignant neoplasm of unspecified part of unspecified bronchus or lung: Secondary | ICD-10-CM | POA: Diagnosis not present

## 2016-12-06 ENCOUNTER — Other Ambulatory Visit: Payer: Self-pay | Admitting: *Deleted

## 2016-12-06 ENCOUNTER — Encounter (HOSPITAL_COMMUNITY): Payer: Self-pay

## 2016-12-06 ENCOUNTER — Ambulatory Visit (HOSPITAL_COMMUNITY)
Admission: RE | Admit: 2016-12-06 | Discharge: 2016-12-06 | Disposition: A | Discharge status: Home / Self Care | Payer: Medicare Other | Source: Ambulatory Visit | Attending: Internal Medicine | Admitting: Internal Medicine

## 2016-12-06 DIAGNOSIS — D638 Anemia in other chronic diseases classified elsewhere: Secondary | ICD-10-CM

## 2016-12-06 DIAGNOSIS — Z9221 Personal history of antineoplastic chemotherapy: Secondary | ICD-10-CM | POA: Diagnosis not present

## 2016-12-06 DIAGNOSIS — I712 Thoracic aortic aneurysm, without rupture: Secondary | ICD-10-CM | POA: Diagnosis not present

## 2016-12-06 DIAGNOSIS — I700 Atherosclerosis of aorta: Secondary | ICD-10-CM | POA: Insufficient documentation

## 2016-12-06 DIAGNOSIS — I2510 Atherosclerotic heart disease of native coronary artery without angina pectoris: Secondary | ICD-10-CM | POA: Diagnosis not present

## 2016-12-06 DIAGNOSIS — C7951 Secondary malignant neoplasm of bone: Secondary | ICD-10-CM | POA: Insufficient documentation

## 2016-12-06 DIAGNOSIS — N179 Acute kidney failure, unspecified: Secondary | ICD-10-CM

## 2016-12-06 DIAGNOSIS — J90 Pleural effusion, not elsewhere classified: Secondary | ICD-10-CM | POA: Diagnosis not present

## 2016-12-06 DIAGNOSIS — R911 Solitary pulmonary nodule: Secondary | ICD-10-CM | POA: Insufficient documentation

## 2016-12-06 DIAGNOSIS — N183 Chronic kidney disease, stage 3 (moderate): Secondary | ICD-10-CM | POA: Diagnosis not present

## 2016-12-06 DIAGNOSIS — I509 Heart failure, unspecified: Secondary | ICD-10-CM | POA: Diagnosis not present

## 2016-12-06 DIAGNOSIS — C3490 Malignant neoplasm of unspecified part of unspecified bronchus or lung: Secondary | ICD-10-CM | POA: Diagnosis not present

## 2016-12-06 DIAGNOSIS — Z5112 Encounter for antineoplastic immunotherapy: Secondary | ICD-10-CM

## 2016-12-06 NOTE — Patient Outreach (Signed)
[facility]) Care Management  12/06/2016  [name] 1934/03/27 [id number]  I was unable to reach [name] when I called today.  Plan: I will follow up with [name] by phone.    [facility]  [phone]

## 2016-12-09 ENCOUNTER — Ambulatory Visit (HOSPITAL_BASED_OUTPATIENT_CLINIC_OR_DEPARTMENT_OTHER): Payer: Medicare Other | Admitting: Internal Medicine

## 2016-12-09 ENCOUNTER — Ambulatory Visit (HOSPITAL_BASED_OUTPATIENT_CLINIC_OR_DEPARTMENT_OTHER): Payer: Medicare Other

## 2016-12-09 ENCOUNTER — Other Ambulatory Visit (HOSPITAL_BASED_OUTPATIENT_CLINIC_OR_DEPARTMENT_OTHER): Payer: Medicare Other

## 2016-12-09 ENCOUNTER — Telehealth: Payer: Self-pay | Admitting: Internal Medicine

## 2016-12-09 ENCOUNTER — Ambulatory Visit: Payer: Medicare Other | Admitting: Nutrition

## 2016-12-09 ENCOUNTER — Encounter: Payer: Self-pay | Admitting: Internal Medicine

## 2016-12-09 VITALS — BP 153/68 | HR 61 | Temp 97.5°F | Resp 16 | Ht 68.0 in | Wt 121.0 lb

## 2016-12-09 DIAGNOSIS — I639 Cerebral infarction, unspecified: Secondary | ICD-10-CM | POA: Diagnosis not present

## 2016-12-09 DIAGNOSIS — Z5112 Encounter for antineoplastic immunotherapy: Secondary | ICD-10-CM | POA: Diagnosis not present

## 2016-12-09 DIAGNOSIS — C7951 Secondary malignant neoplasm of bone: Secondary | ICD-10-CM | POA: Diagnosis not present

## 2016-12-09 DIAGNOSIS — I10 Essential (primary) hypertension: Secondary | ICD-10-CM | POA: Diagnosis not present

## 2016-12-09 DIAGNOSIS — Z79899 Other long term (current) drug therapy: Secondary | ICD-10-CM

## 2016-12-09 DIAGNOSIS — D638 Anemia in other chronic diseases classified elsewhere: Secondary | ICD-10-CM

## 2016-12-09 DIAGNOSIS — Z95828 Presence of other vascular implants and grafts: Secondary | ICD-10-CM

## 2016-12-09 DIAGNOSIS — N183 Chronic kidney disease, stage 3 (moderate): Secondary | ICD-10-CM

## 2016-12-09 DIAGNOSIS — Z85118 Personal history of other malignant neoplasm of bronchus and lung: Secondary | ICD-10-CM | POA: Diagnosis not present

## 2016-12-09 DIAGNOSIS — N179 Acute kidney failure, unspecified: Secondary | ICD-10-CM

## 2016-12-09 LAB — CBC WITH DIFFERENTIAL/PLATELET
BASO%: 1.2 % (ref 0.0–2.0)
BASOS ABS: 0.1 10*3/uL (ref 0.0–0.1)
EOS%: 4.8 % (ref 0.0–7.0)
Eosinophils Absolute: 0.2 10*3/uL (ref 0.0–0.5)
HEMATOCRIT: 33 % — AB (ref 34.8–46.6)
HEMOGLOBIN: 10.7 g/dL — AB (ref 11.6–15.9)
LYMPH#: 0.6 10*3/uL — AB (ref 0.9–3.3)
LYMPH%: 13.5 % — ABNORMAL LOW (ref 14.0–49.7)
MCH: 28.8 pg (ref 25.1–34.0)
MCHC: 32.5 g/dL (ref 31.5–36.0)
MCV: 88.7 fL (ref 79.5–101.0)
MONO#: 0.5 10*3/uL (ref 0.1–0.9)
MONO%: 13.2 % (ref 0.0–14.0)
NEUT#: 2.8 10*3/uL (ref 1.5–6.5)
NEUT%: 67.3 % (ref 38.4–76.8)
PLATELETS: 201 10*3/uL (ref 145–400)
RBC: 3.72 10*6/uL (ref 3.70–5.45)
RDW: 15 % — AB (ref 11.2–14.5)
WBC: 4.1 10*3/uL (ref 3.9–10.3)

## 2016-12-09 LAB — COMPREHENSIVE METABOLIC PANEL
ALBUMIN: 3.2 g/dL — AB (ref 3.5–5.0)
ALK PHOS: 124 U/L (ref 40–150)
ALT: 10 U/L (ref 0–55)
ANION GAP: 9 meq/L (ref 3–11)
AST: 12 U/L (ref 5–34)
BUN: 52.1 mg/dL — AB (ref 7.0–26.0)
CHLORIDE: 109 meq/L (ref 98–109)
CO2: 21 mEq/L — ABNORMAL LOW (ref 22–29)
CREATININE: 3.5 mg/dL — AB (ref 0.6–1.1)
Calcium: 8.8 mg/dL (ref 8.4–10.4)
EGFR: 11 mL/min/{1.73_m2} — ABNORMAL LOW (ref >90)
Glucose: 90 mg/dl (ref 70–140)
Potassium: 5 mEq/L (ref 3.5–5.1)
Sodium: 139 mEq/L (ref 136–145)
Total Bilirubin: 0.48 mg/dL (ref 0.20–1.20)
Total Protein: 6.7 g/dL (ref 6.4–8.3)

## 2016-12-09 LAB — TSH: TSH: 4.771 m(IU)/L — ABNORMAL HIGH (ref 0.308–3.960)

## 2016-12-09 MED ORDER — SODIUM CHLORIDE 0.9 % IV SOLN
Freq: Once | INTRAVENOUS | Status: AC
  Administered 2016-12-09: 15:00:00 via INTRAVENOUS

## 2016-12-09 MED ORDER — SODIUM CHLORIDE 0.9% FLUSH
10.0000 mL | INTRAVENOUS | Status: DC | PRN
  Administered 2016-12-09: 10 mL
  Filled 2016-12-09: qty 10

## 2016-12-09 MED ORDER — HEPARIN SOD (PORK) LOCK FLUSH 100 UNIT/ML IV SOLN
500.0000 [IU] | Freq: Once | INTRAVENOUS | Status: AC | PRN
  Administered 2016-12-09: 500 [IU]
  Filled 2016-12-09: qty 5

## 2016-12-09 MED ORDER — SODIUM CHLORIDE 0.9% FLUSH
10.0000 mL | INTRAVENOUS | Status: DC | PRN
  Administered 2016-12-09: 10 mL via INTRAVENOUS
  Filled 2016-12-09: qty 10

## 2016-12-09 MED ORDER — SODIUM CHLORIDE 0.9 % IV SOLN
200.0000 mg | Freq: Once | INTRAVENOUS | Status: AC
  Administered 2016-12-09: 200 mg via INTRAVENOUS
  Filled 2016-12-09: qty 8

## 2016-12-09 NOTE — Patient Instructions (Signed)

## 2016-12-09 NOTE — Progress Notes (Signed)
[place] [phone]   [phone]  OFFICE PROGRESS NOTE  Celene Squibb, MD [place] [address]  PRINCIPAL DIAGNOSIS: Stage IV non-small cell lung cancer diagnosed in March 2007, with disease recurrence in June 2016 with positive PDL 1 expression (50%).   PRIOR THERAPY:  1) Status post 6 cycles of systemic chemotherapy with carboplatin and docetaxel. Last dose was given January 16, 2006.  2) Palliative radiotherapy to the large right paraspinous mass with osseous invasion centered at L5 under the care of Dr. Valere Dross. 3) Systemic chemotherapy with carboplatin for AUC of 5 and paclitaxel 175 MG/M2 every 3 weeks with Neulasta support. First dose expected on 02/02/2015. Status post 2 cycles, last dose was given 02/23/2015 discontinued secondary to intolerance and mild disease progression. 4) Ketruda 200 mg IV every 3 weeks. First dose 04/14/2015. Status post 3 cycles. Treatment was discontinued after the patient had several hospitalization with recurrent infection and back abscess but were unrelated to her treatment.  CURRENT THERAPY: Retreatment with Ketruda 200 mg IV every 3 weeks status post 3 cycles.  INTERVAL HISTORY: [name] 83 y.o. female returns to the clinic today for follow-up visit accompanied by her daughter. The patient is feeling fine today with no specific complaints except for the generalized fatigue and especially weakness in her lower extremities. She denied having any chest pain, shortness of breath, cough or hemoptysis. She denied having any fever or chills. She has no nausea, vomiting, diarrhea or constipation. She denied having any weight loss or night sweats. She has been tolerating her treatment with Nat Math fairly well. She had repeat CT scan of the chest, abdomen and pelvis performed recently and she is here for evaluation and discussion of her scan results.  MEDICAL HISTORY: Past Medical History:    Diagnosis Date  . A-fib ([place])   . Chronic anemia   . Chronic diastolic (congestive) heart failure ([place])   . Chronic fatigue 04/04/2015  . Chronic fatigue 04/04/2015  . CKD (chronic kidney disease)   . Coronary artery disease    a.  LHC (06/04/05): LHC done in [place] with high grade RCA => s/p BMS to RCA;  b.  Nuclear (09/14/09): Lexiscan; Inf infarct with mild peri-infarct ishemia, EF 52%; Low Risk.  Marland Kitchen DVT (deep venous thrombosis) ([place])   . Foot drop, right   . GERD (gastroesophageal reflux disease)   . Goals of care, counseling/discussion 09/30/2016  . Hypercholesterolemia   . Hypertension   . Ischemic cardiomyopathy    a. Echo (07/26/13): Mild LVH, EF 35-40%, diff HK, inf AK, Gr 2 DD, Tr AI, mildly dilated Ao root, MAC, mild MR, mild LAE, mod reduced RVSF.  . Non-small cell carcinoma of lung ([place])    Stage IV;spinal cancer; had chemo and radiation and immune therapy    ALLERGIES:  is allergic to amlodipine; ciprofloxacin; mirtazapine; statins; aspirin; cephalexin; hydralazine; iron; ambien [zolpidem tartrate]; lorazepam; sulfonamide derivatives; zolpidem; latex; penicillins; shellfish allergy; and tape.  MEDICATIONS:  Current Outpatient Prescriptions  Medication Sig Dispense Refill  . acetaminophen (TYLENOL) 500 MG tablet Take 500 mg by mouth daily as needed for mild pain.    Marland Kitchen apixaban (ELIQUIS) 2.5 MG TABS tablet Take 1 tablet (2.5 mg total) by mouth 2 (two) times daily. 60 tablet 0  . diazepam (VALIUM) 2 MG tablet Take 1 tablet (2 mg total) by mouth See admin instructions. Take 1 tab 1 hour prior to MRI. May repeat X1  after 1 hour if not effective. 2 tablet 0  . diphenoxylate-atropine (LOMOTIL) 2.5-0.025 MG tablet Take 1 tablet by mouth 4 (four) times daily as needed for diarrhea or loose stools. Reported on 11/28/2015    . furosemide (LASIX) 40 MG tablet Take 40 mg by mouth daily as needed for fluid or edema.     Marland Kitchen loperamide (IMODIUM) 2 MG capsule Take 1 capsule (2 mg  total) by mouth every 6 (six) hours as needed for diarrhea or loose stools. 30 capsule 0  . metoprolol tartrate (LOPRESSOR) 25 MG tablet Take 0.5 tablets (12.5 mg total) by mouth at bedtime. 30 tablet 0  . pantoprazole (PROTONIX) 40 MG tablet Take 40 mg by mouth daily before breakfast.     . Pembrolizumab (KEYTRUDA IV) Inject into the vein every 21 ( twenty-one) days.    Marland Kitchen senna-docusate (SENOKOT-S) 8.6-50 MG tablet Take 1 tablet by mouth at bedtime as needed for mild constipation. 30 tablet 0   No current facility-administered medications for this visit.    Facility-Administered Medications Ordered in Other Visits  Medication Dose Route Frequency Provider Last Rate Last Dose  . heparin lock flush 100 unit/mL  500 Units Intracatheter Once PRN Curt Bears, MD      . sodium chloride flush (NS) 0.9 % injection 10 mL  10 mL Intracatheter PRN Curt Bears, MD        SURGICAL HISTORY:  Past Surgical History:  Procedure Laterality Date  . ABDOMINAL HYSTERECTOMY    . APPENDECTOMY    . CATARACT EXTRACTION Bilateral   . CHOLECYSTECTOMY  2011  . CYSTOSCOPY     with stent placement  . CYSTOSCOPY W/ URETERAL STENT PLACEMENT Right 12/12/2015   Procedure: CYSTOSCOPY WITH RETROGRADE PYELOGRAM WITH STENT EXCHANGE;  Surgeon: Franchot Gallo, MD;  Location: [facility];  Service: Urology;  Laterality: Right;  . ERCP  2011   with stone extraction, done same day as cholecystectomy  . HEMATOMA EVACUATION  December 2006   groin  . HEMORROIDECTOMY    . LAMINECTOMY    . PORTACATH PLACEMENT    . TONSILLECTOMY      REVIEW OF SYSTEMS:  Constitutional: positive for fatigue Eyes: negative Ears, nose, mouth, throat, and face: negative Respiratory: negative Cardiovascular: negative Gastrointestinal: negative Genitourinary:negative Integument/breast: negative Hematologic/lymphatic: negative Musculoskeletal:positive for muscle weakness Neurological: negative Behavioral/Psych: negative Endocrine:  negative Allergic/Immunologic: negative   PHYSICAL EXAMINATION: General appearance: alert, cooperative, fatigued and no distress Head: Normocephalic, without obvious abnormality, atraumatic Neck: no adenopathy Lymph nodes: Cervical, supraclavicular, and axillary nodes normal. Resp: clear to auscultation bilaterally Back: symmetric, no curvature. ROM normal. No CVA tenderness. Cardio: regular rate and rhythm, S1, S2 normal, no murmur, click, rub or gallop GI: soft, non-tender; bowel sounds normal; no masses,  no organomegaly Extremities: extremities normal, atraumatic, no cyanosis or edema Neurologic: Alert and oriented X 3, normal strength and tone. Normal symmetric reflexes. Normal coordination and gait  ECOG PERFORMANCE STATUS: 2 - Symptomatic, <50% confined to bed  Blood pressure (!) 153/68, pulse 61, temperature (!) 97.5 F (36.4 C), temperature source Oral, resp. rate 16, height 5' 8"  (1.727 m), weight 121 lb (54.9 kg), SpO2 100 %.  LABORATORY DATA: Lab Results  Component Value Date   WBC 4.1 12/09/2016   HGB 10.7 (L) 12/09/2016   HCT 33.0 (L) 12/09/2016   MCV 88.7 12/09/2016   PLT 201 12/09/2016      Chemistry      Component Value Date/Time   NA 137 11/18/2016 0818  K 5.8 (H) 11/18/2016 0818   CL 113 (H) 11/03/2016 0426   CL 104 06/19/2012 0958   CO2 19 (L) 11/18/2016 0818   BUN 48.9 (H) 11/18/2016 0818   CREATININE 4.1 (HH) 11/18/2016 0818      Component Value Date/Time   CALCIUM 8.7 11/18/2016 0818   ALKPHOS 130 11/18/2016 0818   AST 16 11/18/2016 0818   ALT 8 11/18/2016 0818   BILITOT 0.30 11/18/2016 0818       RADIOGRAPHIC STUDIES: Ct Abdomen Pelvis Wo Contrast  Result Date: 12/06/2016 CLINICAL DATA:  Followup lung squamous cell carcinoma metastatic to bone. On immunotherapy. Acute renal failure superimposed on stage 3 chronic kidney disease. EXAM: CT CHEST, ABDOMEN AND PELVIS WITHOUT CONTRAST TECHNIQUE: Multidetector CT imaging of the chest, abdomen and  pelvis was performed following the standard protocol without IV contrast. COMPARISON:  09/27/2016 FINDINGS: CT CHEST FINDINGS Cardiovascular: No acute findings. Aortic and coronary artery atherosclerosis. 4.3 cm ascending aortic aneurysm remains stable in size when measured at same location on both exams. Stable mild cardiomegaly, without pericardial effusion. Mediastinum/Lymph Nodes: No masses or pathologically enlarged lymph nodes identified on this unenhanced exam. Stable sub-cm mediastinal lymph nodes. Lungs/Pleura: Small right pleural effusion and tiny left pleural effusion and right lower lobe atelectasis have increased since previous study. Mild emphysema again noted. Spiculated nodule in the peripheral left upper lobe on image 58/4 measures 9 x 7 mm compared to 10 x 8 mm previously . Other scattered bilateral sub-cm pulmonary nodules show no significant change, largest in the right midlung measuring 6 mm on image 82/4. No new or enlarging pulmonary nodules or masses are identified. No evidence of pulmonary infiltrate. Musculoskeletal:  No suspicious bone lesions identified. CT ABDOMEN AND PELVIS FINDINGS Hepatobiliary: No masses visualized on this unenhanced exam. Prior cholecystectomy. No evidence of biliary dilatation. Pancreas: No mass or inflammatory changes identified on this unenhanced exam. Spleen: Within normal limits in size. Adrenals/Urinary Tract: No adrenal mass identified. Probable small right renal cysts are stable. Tiny nonobstructing calculi are seen in both kidneys measuring 1-2 mm. Right internal ureteral stent is seen in appropriate position and there is no evidence of hydronephrosis. Foley catheter in the bladder, which is decompressed. Stomach/Bowel: No evidence of obstruction, inflammatory process, or abnormal fluid collections. Colonic diverticulosis, without radiographic evidence of diverticulitis. Vascular/Lymphatic: No pathologically enlarged lymph nodes identified. No abdominal  aortic aneurysm. Aortic atherosclerosis. Reproductive: Prior hysterectomy noted. Adnexal regions are unremarkable in appearance. Other:  None. Musculoskeletal: Mixed lytic and sclerotic bone metastases involving the L4 and L5 vertebral bodies and right sacrum with associated soft tissue component remains stable. Stable old compression deformities of L5 and T11 vertebral bodies. IMPRESSION: 9 mm left upper lobe spiculated pulmonary nodule, without significant change. Other scattered sub-cm bilateral pulmonary nodules also stable. Increased small bilateral pleural effusions and right lower lobe atelectasis. Stable appearance of osseous metastatic disease at L4 -S1. Stable 4.3 cm ascending aortic aneurysm. Aortic and coronary artery atherosclerosis. Electronically Signed   By: Earle Gell M.D.   On: 12/06/2016 10:48   Ct Chest Wo Contrast  Result Date: 12/06/2016 CLINICAL DATA:  Followup lung squamous cell carcinoma metastatic to bone. On immunotherapy. Acute renal failure superimposed on stage 3 chronic kidney disease. EXAM: CT CHEST, ABDOMEN AND PELVIS WITHOUT CONTRAST TECHNIQUE: Multidetector CT imaging of the chest, abdomen and pelvis was performed following the standard protocol without IV contrast. COMPARISON:  09/27/2016 FINDINGS: CT CHEST FINDINGS Cardiovascular: No acute findings. Aortic and coronary artery atherosclerosis. 4.3 cm  ascending aortic aneurysm remains stable in size when measured at same location on both exams. Stable mild cardiomegaly, without pericardial effusion. Mediastinum/Lymph Nodes: No masses or pathologically enlarged lymph nodes identified on this unenhanced exam. Stable sub-cm mediastinal lymph nodes. Lungs/Pleura: Small right pleural effusion and tiny left pleural effusion and right lower lobe atelectasis have increased since previous study. Mild emphysema again noted. Spiculated nodule in the peripheral left upper lobe on image 58/4 measures 9 x 7 mm compared to 10 x 8 mm  previously . Other scattered bilateral sub-cm pulmonary nodules show no significant change, largest in the right midlung measuring 6 mm on image 82/4. No new or enlarging pulmonary nodules or masses are identified. No evidence of pulmonary infiltrate. Musculoskeletal:  No suspicious bone lesions identified. CT ABDOMEN AND PELVIS FINDINGS Hepatobiliary: No masses visualized on this unenhanced exam. Prior cholecystectomy. No evidence of biliary dilatation. Pancreas: No mass or inflammatory changes identified on this unenhanced exam. Spleen: Within normal limits in size. Adrenals/Urinary Tract: No adrenal mass identified. Probable small right renal cysts are stable. Tiny nonobstructing calculi are seen in both kidneys measuring 1-2 mm. Right internal ureteral stent is seen in appropriate position and there is no evidence of hydronephrosis. Foley catheter in the bladder, which is decompressed. Stomach/Bowel: No evidence of obstruction, inflammatory process, or abnormal fluid collections. Colonic diverticulosis, without radiographic evidence of diverticulitis. Vascular/Lymphatic: No pathologically enlarged lymph nodes identified. No abdominal aortic aneurysm. Aortic atherosclerosis. Reproductive: Prior hysterectomy noted. Adnexal regions are unremarkable in appearance. Other:  None. Musculoskeletal: Mixed lytic and sclerotic bone metastases involving the L4 and L5 vertebral bodies and right sacrum with associated soft tissue component remains stable. Stable old compression deformities of L5 and T11 vertebral bodies. IMPRESSION: 9 mm left upper lobe spiculated pulmonary nodule, without significant change. Other scattered sub-cm bilateral pulmonary nodules also stable. Increased small bilateral pleural effusions and right lower lobe atelectasis. Stable appearance of osseous metastatic disease at L4 -S1. Stable 4.3 cm ascending aortic aneurysm. Aortic and coronary artery atherosclerosis. Electronically Signed   By: Earle Gell M.D.   On: 12/06/2016 10:48    ASSESSMENT AND PLAN:  This is a very pleasant 83 years old white female with a stage IV non-small cell lung cancer initially diagnosed in March 2007 status post systemic chemotherapy with carboplatin and docetaxel for 6 cycles and was on observation for 9 years. She developed disease progression with metastatic bone disease in the lower thoracic and lumbar spines. The patient was treated with palliative radiotherapy followed by 2 cycles of systemic chemotherapy with carboplatin and paclitaxel discontinued secondary to intolerance. She is currently on treatment with immunotherapy with [place] status post 3 cycles and she is currently tolerating this treatment fairly well with no significant adverse effects. She had repeat CT scan of the chest, abdomen and pelvis performed recently. I personally and independently reviewed the scan and discuss the results with the patient and her daughter. Her scan showed no evidence for disease progression. I recommended for the patient to continue her current treatment with Ketruda 240 mg IV every 3 weeks. The patient will proceed with cycle #4 today. She will come back for follow-up visit in 3 weeks for evaluation with the next cycle of her treatment. For hypertension, she will continue her current treatment for hypertension and she will monitor her blood pressure closely at home and discussed with her primary care physician for any adjustment of her medications. For the recent history of stroke, she will continue on her treatment  with Eliquis and Plavix. The patient was advised to call immediately if she has any concerning symptoms in the interval. All questions were answered. The patient knows to call the clinic with any problems, questions or concerns. We can certainly see the patient much sooner if necessary.  Disclaimer: This note was dictated with voice recognition software. Similar sounding words can inadvertently be  transcribed and may not be corrected upon review.

## 2016-12-09 NOTE — Telephone Encounter (Signed)
Scheduled appt per 7/9 los - Gave patient AVS and calender per los.

## 2016-12-09 NOTE — Patient Instructions (Signed)
[facility] Discharge Instructions for Patients Receiving Chemotherapy  Today you received the following chemotherapy agents: Keytruda   To help prevent nausea and vomiting after your treatment, we encourage you to take your nausea medication as directed    If you develop nausea and vomiting that is not controlled by your nausea medication, call the clinic.   BELOW ARE SYMPTOMS THAT SHOULD BE REPORTED IMMEDIATELY:  *FEVER GREATER THAN 100.5 F  *CHILLS WITH OR WITHOUT FEVER  NAUSEA AND VOMITING THAT IS NOT CONTROLLED WITH YOUR NAUSEA MEDICATION  *UNUSUAL SHORTNESS OF BREATH  *UNUSUAL BRUISING OR BLEEDING  TENDERNESS IN MOUTH AND THROAT WITH OR WITHOUT PRESENCE OF ULCERS  *URINARY PROBLEMS  *BOWEL PROBLEMS  UNUSUAL RASH Items with * indicate a potential emergency and should be followed up as soon as possible.  Feel free to call the clinic you have any questions or concerns. The clinic phone number is [phone].  Please show the CHEMO ALERT CARD at check-in to the Emergency Department and triage nurse.   

## 2016-12-09 NOTE — Progress Notes (Signed)
Patient was identified to be at risk for malnutrition on the MST secondary to poor appetite and weight loss.  83 year old female diagnosed with metastatic lung cancer in March 2007.  Past medical history includes A. fib, CHF, anemia, chronic kidney disease, DVT, GERD, hypercholesterolemia, and hypertension.  Medications include Lomotil, Lasix, Imodium, Protonix, and Senokot.  Labs include potassium 5.8, BUN 48.9, creatinine 4.1, albumin 3.3.  Height: 68 inches. Weight: 121 pounds. Usual body weight: 157 pounds 2 years ago. BMI: 18.4.  Patient reports she has a good appetite. She is trying to eat more so she doesn't lose anymore weight. Patient endorses 23% weight loss over 2 years. Patient denies nutrition impact symptoms.  Nutrition diagnosis:  Unintended weight loss related to poor appetite and metastatic lung cancer as evidenced by 23% weight loss over 2 years.  Intervention: Educated patient to consume small frequent meals with high-calorie, high-protein foods. I provided a fact sheet. Encouraged patient to try oral nutrition supplements. Recommended patient increase fluid intake. Questions were answered.  Teach back method used.  Monitoring, evaluation, goals: Patient will tolerate increased calories and protein to minimize weight loss.  Next visit: Monday, July 30, during infusion.  **Disclaimer: This note was dictated with voice recognition software. Similar sounding words can inadvertently be transcribed and this note may contain transcription errors which may not have been corrected upon publication of note.**

## 2016-12-09 NOTE — Progress Notes (Signed)
Per Dr Julien Nordmann , it is okay to treat pt today with Keytruda and creatine from today.

## 2016-12-10 DIAGNOSIS — C7951 Secondary malignant neoplasm of bone: Secondary | ICD-10-CM | POA: Diagnosis not present

## 2016-12-10 DIAGNOSIS — Z466 Encounter for fitting and adjustment of urinary device: Secondary | ICD-10-CM | POA: Diagnosis not present

## 2016-12-10 DIAGNOSIS — I69320 Aphasia following cerebral infarction: Secondary | ICD-10-CM | POA: Diagnosis not present

## 2016-12-10 DIAGNOSIS — C3490 Malignant neoplasm of unspecified part of unspecified bronchus or lung: Secondary | ICD-10-CM | POA: Diagnosis not present

## 2016-12-10 DIAGNOSIS — I130 Hypertensive heart and chronic kidney disease with heart failure and stage 1 through stage 4 chronic kidney disease, or unspecified chronic kidney disease: Secondary | ICD-10-CM | POA: Diagnosis not present

## 2016-12-10 DIAGNOSIS — E7800 Pure hypercholesterolemia, unspecified: Secondary | ICD-10-CM | POA: Diagnosis not present

## 2016-12-10 DIAGNOSIS — I2510 Atherosclerotic heart disease of native coronary artery without angina pectoris: Secondary | ICD-10-CM | POA: Diagnosis not present

## 2016-12-10 DIAGNOSIS — D631 Anemia in chronic kidney disease: Secondary | ICD-10-CM | POA: Diagnosis not present

## 2016-12-10 DIAGNOSIS — I4891 Unspecified atrial fibrillation: Secondary | ICD-10-CM | POA: Diagnosis not present

## 2016-12-10 DIAGNOSIS — I255 Ischemic cardiomyopathy: Secondary | ICD-10-CM | POA: Diagnosis not present

## 2016-12-10 DIAGNOSIS — G8311 Monoplegia of lower limb affecting right dominant side: Secondary | ICD-10-CM | POA: Diagnosis not present

## 2016-12-10 DIAGNOSIS — N183 Chronic kidney disease, stage 3 (moderate): Secondary | ICD-10-CM | POA: Diagnosis not present

## 2016-12-10 DIAGNOSIS — I5032 Chronic diastolic (congestive) heart failure: Secondary | ICD-10-CM | POA: Diagnosis not present

## 2016-12-12 DIAGNOSIS — Z466 Encounter for fitting and adjustment of urinary device: Secondary | ICD-10-CM | POA: Diagnosis not present

## 2016-12-12 DIAGNOSIS — E7800 Pure hypercholesterolemia, unspecified: Secondary | ICD-10-CM | POA: Diagnosis not present

## 2016-12-12 DIAGNOSIS — C7951 Secondary malignant neoplasm of bone: Secondary | ICD-10-CM | POA: Diagnosis not present

## 2016-12-12 DIAGNOSIS — C3490 Malignant neoplasm of unspecified part of unspecified bronchus or lung: Secondary | ICD-10-CM | POA: Diagnosis not present

## 2016-12-12 DIAGNOSIS — G8311 Monoplegia of lower limb affecting right dominant side: Secondary | ICD-10-CM | POA: Diagnosis not present

## 2016-12-12 DIAGNOSIS — I130 Hypertensive heart and chronic kidney disease with heart failure and stage 1 through stage 4 chronic kidney disease, or unspecified chronic kidney disease: Secondary | ICD-10-CM | POA: Diagnosis not present

## 2016-12-12 DIAGNOSIS — I69320 Aphasia following cerebral infarction: Secondary | ICD-10-CM | POA: Diagnosis not present

## 2016-12-12 DIAGNOSIS — I2510 Atherosclerotic heart disease of native coronary artery without angina pectoris: Secondary | ICD-10-CM | POA: Diagnosis not present

## 2016-12-12 DIAGNOSIS — I5032 Chronic diastolic (congestive) heart failure: Secondary | ICD-10-CM | POA: Diagnosis not present

## 2016-12-12 DIAGNOSIS — I255 Ischemic cardiomyopathy: Secondary | ICD-10-CM | POA: Diagnosis not present

## 2016-12-12 DIAGNOSIS — D631 Anemia in chronic kidney disease: Secondary | ICD-10-CM | POA: Diagnosis not present

## 2016-12-12 DIAGNOSIS — I4891 Unspecified atrial fibrillation: Secondary | ICD-10-CM | POA: Diagnosis not present

## 2016-12-12 DIAGNOSIS — N183 Chronic kidney disease, stage 3 (moderate): Secondary | ICD-10-CM | POA: Diagnosis not present

## 2016-12-13 DIAGNOSIS — E7800 Pure hypercholesterolemia, unspecified: Secondary | ICD-10-CM | POA: Diagnosis not present

## 2016-12-13 DIAGNOSIS — I130 Hypertensive heart and chronic kidney disease with heart failure and stage 1 through stage 4 chronic kidney disease, or unspecified chronic kidney disease: Secondary | ICD-10-CM | POA: Diagnosis not present

## 2016-12-13 DIAGNOSIS — I69320 Aphasia following cerebral infarction: Secondary | ICD-10-CM | POA: Diagnosis not present

## 2016-12-13 DIAGNOSIS — C3490 Malignant neoplasm of unspecified part of unspecified bronchus or lung: Secondary | ICD-10-CM | POA: Diagnosis not present

## 2016-12-13 DIAGNOSIS — G8311 Monoplegia of lower limb affecting right dominant side: Secondary | ICD-10-CM | POA: Diagnosis not present

## 2016-12-13 DIAGNOSIS — I4891 Unspecified atrial fibrillation: Secondary | ICD-10-CM | POA: Diagnosis not present

## 2016-12-13 DIAGNOSIS — N183 Chronic kidney disease, stage 3 (moderate): Secondary | ICD-10-CM | POA: Diagnosis not present

## 2016-12-13 DIAGNOSIS — D631 Anemia in chronic kidney disease: Secondary | ICD-10-CM | POA: Diagnosis not present

## 2016-12-13 DIAGNOSIS — Z466 Encounter for fitting and adjustment of urinary device: Secondary | ICD-10-CM | POA: Diagnosis not present

## 2016-12-13 DIAGNOSIS — C7951 Secondary malignant neoplasm of bone: Secondary | ICD-10-CM | POA: Diagnosis not present

## 2016-12-13 DIAGNOSIS — I5032 Chronic diastolic (congestive) heart failure: Secondary | ICD-10-CM | POA: Diagnosis not present

## 2016-12-13 DIAGNOSIS — I2510 Atherosclerotic heart disease of native coronary artery without angina pectoris: Secondary | ICD-10-CM | POA: Diagnosis not present

## 2016-12-13 DIAGNOSIS — I255 Ischemic cardiomyopathy: Secondary | ICD-10-CM | POA: Diagnosis not present

## 2016-12-17 DIAGNOSIS — G8311 Monoplegia of lower limb affecting right dominant side: Secondary | ICD-10-CM | POA: Diagnosis not present

## 2016-12-17 DIAGNOSIS — I2510 Atherosclerotic heart disease of native coronary artery without angina pectoris: Secondary | ICD-10-CM | POA: Diagnosis not present

## 2016-12-17 DIAGNOSIS — I130 Hypertensive heart and chronic kidney disease with heart failure and stage 1 through stage 4 chronic kidney disease, or unspecified chronic kidney disease: Secondary | ICD-10-CM | POA: Diagnosis not present

## 2016-12-17 DIAGNOSIS — C7951 Secondary malignant neoplasm of bone: Secondary | ICD-10-CM | POA: Diagnosis not present

## 2016-12-17 DIAGNOSIS — I5032 Chronic diastolic (congestive) heart failure: Secondary | ICD-10-CM | POA: Diagnosis not present

## 2016-12-17 DIAGNOSIS — D631 Anemia in chronic kidney disease: Secondary | ICD-10-CM | POA: Diagnosis not present

## 2016-12-17 DIAGNOSIS — N183 Chronic kidney disease, stage 3 (moderate): Secondary | ICD-10-CM | POA: Diagnosis not present

## 2016-12-17 DIAGNOSIS — I4891 Unspecified atrial fibrillation: Secondary | ICD-10-CM | POA: Diagnosis not present

## 2016-12-17 DIAGNOSIS — C3490 Malignant neoplasm of unspecified part of unspecified bronchus or lung: Secondary | ICD-10-CM | POA: Diagnosis not present

## 2016-12-17 DIAGNOSIS — Z466 Encounter for fitting and adjustment of urinary device: Secondary | ICD-10-CM | POA: Diagnosis not present

## 2016-12-17 DIAGNOSIS — E7800 Pure hypercholesterolemia, unspecified: Secondary | ICD-10-CM | POA: Diagnosis not present

## 2016-12-17 DIAGNOSIS — I255 Ischemic cardiomyopathy: Secondary | ICD-10-CM | POA: Diagnosis not present

## 2016-12-17 DIAGNOSIS — I69320 Aphasia following cerebral infarction: Secondary | ICD-10-CM | POA: Diagnosis not present

## 2016-12-18 DIAGNOSIS — I4891 Unspecified atrial fibrillation: Secondary | ICD-10-CM | POA: Diagnosis not present

## 2016-12-18 DIAGNOSIS — C7951 Secondary malignant neoplasm of bone: Secondary | ICD-10-CM | POA: Diagnosis not present

## 2016-12-18 DIAGNOSIS — I130 Hypertensive heart and chronic kidney disease with heart failure and stage 1 through stage 4 chronic kidney disease, or unspecified chronic kidney disease: Secondary | ICD-10-CM | POA: Diagnosis not present

## 2016-12-18 DIAGNOSIS — I5032 Chronic diastolic (congestive) heart failure: Secondary | ICD-10-CM | POA: Diagnosis not present

## 2016-12-18 DIAGNOSIS — I2510 Atherosclerotic heart disease of native coronary artery without angina pectoris: Secondary | ICD-10-CM | POA: Diagnosis not present

## 2016-12-18 DIAGNOSIS — E7800 Pure hypercholesterolemia, unspecified: Secondary | ICD-10-CM | POA: Diagnosis not present

## 2016-12-18 DIAGNOSIS — N183 Chronic kidney disease, stage 3 (moderate): Secondary | ICD-10-CM | POA: Diagnosis not present

## 2016-12-18 DIAGNOSIS — Z466 Encounter for fitting and adjustment of urinary device: Secondary | ICD-10-CM | POA: Diagnosis not present

## 2016-12-18 DIAGNOSIS — G8311 Monoplegia of lower limb affecting right dominant side: Secondary | ICD-10-CM | POA: Diagnosis not present

## 2016-12-18 DIAGNOSIS — C3490 Malignant neoplasm of unspecified part of unspecified bronchus or lung: Secondary | ICD-10-CM | POA: Diagnosis not present

## 2016-12-18 DIAGNOSIS — D631 Anemia in chronic kidney disease: Secondary | ICD-10-CM | POA: Diagnosis not present

## 2016-12-18 DIAGNOSIS — I69320 Aphasia following cerebral infarction: Secondary | ICD-10-CM | POA: Diagnosis not present

## 2016-12-18 DIAGNOSIS — I255 Ischemic cardiomyopathy: Secondary | ICD-10-CM | POA: Diagnosis not present

## 2016-12-19 ENCOUNTER — Other Ambulatory Visit: Payer: Self-pay | Admitting: *Deleted

## 2016-12-19 ENCOUNTER — Other Ambulatory Visit: Payer: Self-pay | Admitting: Family Medicine

## 2016-12-19 DIAGNOSIS — I5032 Chronic diastolic (congestive) heart failure: Secondary | ICD-10-CM | POA: Diagnosis not present

## 2016-12-19 DIAGNOSIS — N183 Chronic kidney disease, stage 3 (moderate): Secondary | ICD-10-CM | POA: Diagnosis not present

## 2016-12-19 DIAGNOSIS — C3490 Malignant neoplasm of unspecified part of unspecified bronchus or lung: Secondary | ICD-10-CM | POA: Diagnosis not present

## 2016-12-19 DIAGNOSIS — E7800 Pure hypercholesterolemia, unspecified: Secondary | ICD-10-CM | POA: Diagnosis not present

## 2016-12-19 DIAGNOSIS — C7951 Secondary malignant neoplasm of bone: Secondary | ICD-10-CM | POA: Diagnosis not present

## 2016-12-19 DIAGNOSIS — Z466 Encounter for fitting and adjustment of urinary device: Secondary | ICD-10-CM | POA: Diagnosis not present

## 2016-12-19 DIAGNOSIS — G8311 Monoplegia of lower limb affecting right dominant side: Secondary | ICD-10-CM | POA: Diagnosis not present

## 2016-12-19 DIAGNOSIS — I4891 Unspecified atrial fibrillation: Secondary | ICD-10-CM | POA: Diagnosis not present

## 2016-12-19 DIAGNOSIS — D631 Anemia in chronic kidney disease: Secondary | ICD-10-CM | POA: Diagnosis not present

## 2016-12-19 DIAGNOSIS — I2510 Atherosclerotic heart disease of native coronary artery without angina pectoris: Secondary | ICD-10-CM | POA: Diagnosis not present

## 2016-12-19 DIAGNOSIS — I69320 Aphasia following cerebral infarction: Secondary | ICD-10-CM | POA: Diagnosis not present

## 2016-12-19 DIAGNOSIS — I130 Hypertensive heart and chronic kidney disease with heart failure and stage 1 through stage 4 chronic kidney disease, or unspecified chronic kidney disease: Secondary | ICD-10-CM | POA: Diagnosis not present

## 2016-12-19 DIAGNOSIS — I255 Ischemic cardiomyopathy: Secondary | ICD-10-CM | POA: Diagnosis not present

## 2016-12-19 NOTE — Patient Outreach (Signed)
[facility]) Care Management  12/19/2016  CANIYA TAGLE 10-Jul-1933 [id number]   Mrs. CHERAE MARTON is an 83 year old female primary care patient of Dr. Wende Neighbors with medical history which includes CVA, Stage IV lung cancer with mets to bone Curt Bears MD Oncologist), atrial fibrillation, history of multiple DVT's, chronic anemia, hyperlipidemia, chronic kidney disease stage 3, Ischemic cardiomyopathy and congestive heart failure.    Mrs. Fujii presented to the ED on 10/30/16 with difficulty with her speech and was discharged to home on 11/04/16 after a hospital admission for stroke. She was referred to [facility] after triggering Red on the [facility] Stroke Dashboard and was contacted by the telephonic case manager then referred to me for community engagement and transition of care.    I spoke with Mrs. Biondo on  3 separate occasions, lastly on 11/26/16. AT that time, Mrs. Notaro said she was engaged with her home health rehab team and had a full schedule of upcoming appointments. She did not wish to have a visit from me given her full visiting team and provider schedule but said I could call her to check in.   Plan: I was unable to reach Mrs. Haran today so will return a call to her again next week.    [facility] [facility]  [phone]

## 2016-12-20 DIAGNOSIS — Z466 Encounter for fitting and adjustment of urinary device: Secondary | ICD-10-CM | POA: Diagnosis not present

## 2016-12-20 DIAGNOSIS — C7951 Secondary malignant neoplasm of bone: Secondary | ICD-10-CM | POA: Diagnosis not present

## 2016-12-20 DIAGNOSIS — D631 Anemia in chronic kidney disease: Secondary | ICD-10-CM | POA: Diagnosis not present

## 2016-12-20 DIAGNOSIS — I5032 Chronic diastolic (congestive) heart failure: Secondary | ICD-10-CM | POA: Diagnosis not present

## 2016-12-20 DIAGNOSIS — I255 Ischemic cardiomyopathy: Secondary | ICD-10-CM | POA: Diagnosis not present

## 2016-12-20 DIAGNOSIS — G8311 Monoplegia of lower limb affecting right dominant side: Secondary | ICD-10-CM | POA: Diagnosis not present

## 2016-12-20 DIAGNOSIS — C3490 Malignant neoplasm of unspecified part of unspecified bronchus or lung: Secondary | ICD-10-CM | POA: Diagnosis not present

## 2016-12-20 DIAGNOSIS — E7800 Pure hypercholesterolemia, unspecified: Secondary | ICD-10-CM | POA: Diagnosis not present

## 2016-12-20 DIAGNOSIS — I2510 Atherosclerotic heart disease of native coronary artery without angina pectoris: Secondary | ICD-10-CM | POA: Diagnosis not present

## 2016-12-20 DIAGNOSIS — I69320 Aphasia following cerebral infarction: Secondary | ICD-10-CM | POA: Diagnosis not present

## 2016-12-20 DIAGNOSIS — I4891 Unspecified atrial fibrillation: Secondary | ICD-10-CM | POA: Diagnosis not present

## 2016-12-20 DIAGNOSIS — I130 Hypertensive heart and chronic kidney disease with heart failure and stage 1 through stage 4 chronic kidney disease, or unspecified chronic kidney disease: Secondary | ICD-10-CM | POA: Diagnosis not present

## 2016-12-20 DIAGNOSIS — N183 Chronic kidney disease, stage 3 (moderate): Secondary | ICD-10-CM | POA: Diagnosis not present

## 2016-12-23 ENCOUNTER — Other Ambulatory Visit: Payer: Self-pay | Admitting: *Deleted

## 2016-12-23 NOTE — Patient Outreach (Signed)
[facility]) Care Management  12/23/2016  [name] Jul 26, 1933 [id number]  Mrs. [name] is an 83 year old female primary care patient of Dr. Wende Neighbors with medical history which includes CVA, Stage IV lung cancer with mets to bone [name] Bears MD Oncologist), atrial fibrillation, history of multiple DVT's, chronic anemia, hyperlipidemia, chronic kidney disease stage 3, Ischemic cardiomyopathy and congestive heart failure.   [name] presented to the ED on 10/30/16 with difficulty with her speech and was discharged to home on 11/04/16 after a hospital admission for stroke. She was referred to [facility] after triggering Red on the [facility] Stroke Dashboard and was contacted by the telephonic case manager then referred to me for community engagement and transition of care.   [name] and I have spoken by phone on numerous occasions regarding her general condition, plan of care, and symptom management. [name] has been engaged with her home health rehab team and the cancer care team at [facility]. [name] daughter and family are very involved in her care. [name] daughter, [name] attends all of [name] appointments with her. She has attended all scheduled appointments.   Most recently, [name] saw Dr. Julien [name] in the oncology clinic after undergoing repeat CT for evaluation of disease progression. As per Dr. Worthy Flank notation, the CT scans showed "no evidence for disease progression". She is currently being treated with IV [place] every 3 weeks and last received a treatment on 12/09/16. She has a scheduled follow up appointment in 3 weeks and her daughter will take her to the appointment.   [name] is very well versed on management of her other medical problems including hypertension and recent stroke. She is monitoring her blood pressure at home and continues to take Eliquis and Plavix.  [name] has  declined 2 separate offers for me to visit her at home and feels she has more than adequate care between her primary care team and the oncology team.   [name] has my contact information should she feel she is in need of care management services.   Plan: I will close [name]'s case. We are happy to provide assistance with community case management services to Mrs. Flight at any time in the future should she have a need.    [facility]  [phone]   All questions were answered. The patient knows to call the clinic with any problems, questions or concerns. We can certainly see the patient much sooner if necessary.

## 2016-12-24 DIAGNOSIS — I130 Hypertensive heart and chronic kidney disease with heart failure and stage 1 through stage 4 chronic kidney disease, or unspecified chronic kidney disease: Secondary | ICD-10-CM | POA: Diagnosis not present

## 2016-12-24 DIAGNOSIS — C7951 Secondary malignant neoplasm of bone: Secondary | ICD-10-CM | POA: Diagnosis not present

## 2016-12-24 DIAGNOSIS — I4891 Unspecified atrial fibrillation: Secondary | ICD-10-CM | POA: Diagnosis not present

## 2016-12-24 DIAGNOSIS — I255 Ischemic cardiomyopathy: Secondary | ICD-10-CM | POA: Diagnosis not present

## 2016-12-24 DIAGNOSIS — C3490 Malignant neoplasm of unspecified part of unspecified bronchus or lung: Secondary | ICD-10-CM | POA: Diagnosis not present

## 2016-12-24 DIAGNOSIS — I2510 Atherosclerotic heart disease of native coronary artery without angina pectoris: Secondary | ICD-10-CM | POA: Diagnosis not present

## 2016-12-24 DIAGNOSIS — I69320 Aphasia following cerebral infarction: Secondary | ICD-10-CM | POA: Diagnosis not present

## 2016-12-24 DIAGNOSIS — Z466 Encounter for fitting and adjustment of urinary device: Secondary | ICD-10-CM | POA: Diagnosis not present

## 2016-12-24 DIAGNOSIS — N183 Chronic kidney disease, stage 3 (moderate): Secondary | ICD-10-CM | POA: Diagnosis not present

## 2016-12-24 DIAGNOSIS — I5032 Chronic diastolic (congestive) heart failure: Secondary | ICD-10-CM | POA: Diagnosis not present

## 2016-12-24 DIAGNOSIS — D631 Anemia in chronic kidney disease: Secondary | ICD-10-CM | POA: Diagnosis not present

## 2016-12-24 DIAGNOSIS — E7800 Pure hypercholesterolemia, unspecified: Secondary | ICD-10-CM | POA: Diagnosis not present

## 2016-12-24 DIAGNOSIS — G8311 Monoplegia of lower limb affecting right dominant side: Secondary | ICD-10-CM | POA: Diagnosis not present

## 2016-12-25 DIAGNOSIS — D631 Anemia in chronic kidney disease: Secondary | ICD-10-CM | POA: Diagnosis not present

## 2016-12-25 DIAGNOSIS — E7800 Pure hypercholesterolemia, unspecified: Secondary | ICD-10-CM | POA: Diagnosis not present

## 2016-12-25 DIAGNOSIS — I5032 Chronic diastolic (congestive) heart failure: Secondary | ICD-10-CM | POA: Diagnosis not present

## 2016-12-25 DIAGNOSIS — I2510 Atherosclerotic heart disease of native coronary artery without angina pectoris: Secondary | ICD-10-CM | POA: Diagnosis not present

## 2016-12-25 DIAGNOSIS — I69320 Aphasia following cerebral infarction: Secondary | ICD-10-CM | POA: Diagnosis not present

## 2016-12-25 DIAGNOSIS — G8311 Monoplegia of lower limb affecting right dominant side: Secondary | ICD-10-CM | POA: Diagnosis not present

## 2016-12-25 DIAGNOSIS — C7951 Secondary malignant neoplasm of bone: Secondary | ICD-10-CM | POA: Diagnosis not present

## 2016-12-25 DIAGNOSIS — Z466 Encounter for fitting and adjustment of urinary device: Secondary | ICD-10-CM | POA: Diagnosis not present

## 2016-12-25 DIAGNOSIS — C3490 Malignant neoplasm of unspecified part of unspecified bronchus or lung: Secondary | ICD-10-CM | POA: Diagnosis not present

## 2016-12-25 DIAGNOSIS — I255 Ischemic cardiomyopathy: Secondary | ICD-10-CM | POA: Diagnosis not present

## 2016-12-25 DIAGNOSIS — I130 Hypertensive heart and chronic kidney disease with heart failure and stage 1 through stage 4 chronic kidney disease, or unspecified chronic kidney disease: Secondary | ICD-10-CM | POA: Diagnosis not present

## 2016-12-25 DIAGNOSIS — I4891 Unspecified atrial fibrillation: Secondary | ICD-10-CM | POA: Diagnosis not present

## 2016-12-25 DIAGNOSIS — N183 Chronic kidney disease, stage 3 (moderate): Secondary | ICD-10-CM | POA: Diagnosis not present

## 2016-12-26 DIAGNOSIS — D631 Anemia in chronic kidney disease: Secondary | ICD-10-CM | POA: Diagnosis not present

## 2016-12-26 DIAGNOSIS — I130 Hypertensive heart and chronic kidney disease with heart failure and stage 1 through stage 4 chronic kidney disease, or unspecified chronic kidney disease: Secondary | ICD-10-CM | POA: Diagnosis not present

## 2016-12-26 DIAGNOSIS — I69320 Aphasia following cerebral infarction: Secondary | ICD-10-CM | POA: Diagnosis not present

## 2016-12-26 DIAGNOSIS — Z466 Encounter for fitting and adjustment of urinary device: Secondary | ICD-10-CM | POA: Diagnosis not present

## 2016-12-26 DIAGNOSIS — I4891 Unspecified atrial fibrillation: Secondary | ICD-10-CM | POA: Diagnosis not present

## 2016-12-26 DIAGNOSIS — C3490 Malignant neoplasm of unspecified part of unspecified bronchus or lung: Secondary | ICD-10-CM | POA: Diagnosis not present

## 2016-12-26 DIAGNOSIS — I255 Ischemic cardiomyopathy: Secondary | ICD-10-CM | POA: Diagnosis not present

## 2016-12-26 DIAGNOSIS — E7800 Pure hypercholesterolemia, unspecified: Secondary | ICD-10-CM | POA: Diagnosis not present

## 2016-12-26 DIAGNOSIS — I2510 Atherosclerotic heart disease of native coronary artery without angina pectoris: Secondary | ICD-10-CM | POA: Diagnosis not present

## 2016-12-26 DIAGNOSIS — N183 Chronic kidney disease, stage 3 (moderate): Secondary | ICD-10-CM | POA: Diagnosis not present

## 2016-12-26 DIAGNOSIS — C7951 Secondary malignant neoplasm of bone: Secondary | ICD-10-CM | POA: Diagnosis not present

## 2016-12-26 DIAGNOSIS — G8311 Monoplegia of lower limb affecting right dominant side: Secondary | ICD-10-CM | POA: Diagnosis not present

## 2016-12-26 DIAGNOSIS — I5032 Chronic diastolic (congestive) heart failure: Secondary | ICD-10-CM | POA: Diagnosis not present

## 2016-12-27 DIAGNOSIS — G8311 Monoplegia of lower limb affecting right dominant side: Secondary | ICD-10-CM | POA: Diagnosis not present

## 2016-12-27 DIAGNOSIS — I5032 Chronic diastolic (congestive) heart failure: Secondary | ICD-10-CM | POA: Diagnosis not present

## 2016-12-27 DIAGNOSIS — I255 Ischemic cardiomyopathy: Secondary | ICD-10-CM | POA: Diagnosis not present

## 2016-12-27 DIAGNOSIS — D631 Anemia in chronic kidney disease: Secondary | ICD-10-CM | POA: Diagnosis not present

## 2016-12-27 DIAGNOSIS — C7951 Secondary malignant neoplasm of bone: Secondary | ICD-10-CM | POA: Diagnosis not present

## 2016-12-27 DIAGNOSIS — I2510 Atherosclerotic heart disease of native coronary artery without angina pectoris: Secondary | ICD-10-CM | POA: Diagnosis not present

## 2016-12-27 DIAGNOSIS — E7800 Pure hypercholesterolemia, unspecified: Secondary | ICD-10-CM | POA: Diagnosis not present

## 2016-12-27 DIAGNOSIS — N183 Chronic kidney disease, stage 3 (moderate): Secondary | ICD-10-CM | POA: Diagnosis not present

## 2016-12-27 DIAGNOSIS — I4891 Unspecified atrial fibrillation: Secondary | ICD-10-CM | POA: Diagnosis not present

## 2016-12-27 DIAGNOSIS — I69320 Aphasia following cerebral infarction: Secondary | ICD-10-CM | POA: Diagnosis not present

## 2016-12-27 DIAGNOSIS — Z466 Encounter for fitting and adjustment of urinary device: Secondary | ICD-10-CM | POA: Diagnosis not present

## 2016-12-27 DIAGNOSIS — I130 Hypertensive heart and chronic kidney disease with heart failure and stage 1 through stage 4 chronic kidney disease, or unspecified chronic kidney disease: Secondary | ICD-10-CM | POA: Diagnosis not present

## 2016-12-27 DIAGNOSIS — C3490 Malignant neoplasm of unspecified part of unspecified bronchus or lung: Secondary | ICD-10-CM | POA: Diagnosis not present

## 2016-12-30 ENCOUNTER — Ambulatory Visit (HOSPITAL_BASED_OUTPATIENT_CLINIC_OR_DEPARTMENT_OTHER): Payer: Medicare Other | Admitting: Internal Medicine

## 2016-12-30 ENCOUNTER — Encounter: Payer: Self-pay | Admitting: Internal Medicine

## 2016-12-30 ENCOUNTER — Ambulatory Visit (HOSPITAL_BASED_OUTPATIENT_CLINIC_OR_DEPARTMENT_OTHER): Payer: Medicare Other

## 2016-12-30 ENCOUNTER — Ambulatory Visit: Payer: Medicare Other | Admitting: Nutrition

## 2016-12-30 ENCOUNTER — Other Ambulatory Visit (HOSPITAL_BASED_OUTPATIENT_CLINIC_OR_DEPARTMENT_OTHER): Payer: Medicare Other

## 2016-12-30 ENCOUNTER — Ambulatory Visit: Payer: Medicare Other

## 2016-12-30 VITALS — BP 154/83 | HR 63 | Temp 97.8°F | Resp 18 | Ht 68.0 in | Wt 124.3 lb

## 2016-12-30 DIAGNOSIS — Z85118 Personal history of other malignant neoplasm of bronchus and lung: Secondary | ICD-10-CM

## 2016-12-30 DIAGNOSIS — Z5112 Encounter for antineoplastic immunotherapy: Secondary | ICD-10-CM | POA: Diagnosis not present

## 2016-12-30 DIAGNOSIS — C7951 Secondary malignant neoplasm of bone: Secondary | ICD-10-CM

## 2016-12-30 DIAGNOSIS — Z95828 Presence of other vascular implants and grafts: Secondary | ICD-10-CM

## 2016-12-30 DIAGNOSIS — N189 Chronic kidney disease, unspecified: Secondary | ICD-10-CM

## 2016-12-30 DIAGNOSIS — Z79899 Other long term (current) drug therapy: Secondary | ICD-10-CM | POA: Diagnosis not present

## 2016-12-30 LAB — CBC WITH DIFFERENTIAL/PLATELET
BASO%: 0.4 % (ref 0.0–2.0)
BASOS ABS: 0 10*3/uL (ref 0.0–0.1)
EOS%: 5.7 % (ref 0.0–7.0)
Eosinophils Absolute: 0.3 10*3/uL (ref 0.0–0.5)
HEMATOCRIT: 33.3 % — AB (ref 34.8–46.6)
HGB: 10.5 g/dL — ABNORMAL LOW (ref 11.6–15.9)
LYMPH#: 0.5 10*3/uL — AB (ref 0.9–3.3)
LYMPH%: 11.1 % — AB (ref 14.0–49.7)
MCH: 28.6 pg (ref 25.1–34.0)
MCHC: 31.5 g/dL (ref 31.5–36.0)
MCV: 90.7 fL (ref 79.5–101.0)
MONO#: 0.7 10*3/uL (ref 0.1–0.9)
MONO%: 14.6 % — ABNORMAL HIGH (ref 0.0–14.0)
NEUT#: 3.1 10*3/uL (ref 1.5–6.5)
NEUT%: 68.2 % (ref 38.4–76.8)
PLATELETS: 196 10*3/uL (ref 145–400)
RBC: 3.67 10*6/uL — AB (ref 3.70–5.45)
RDW: 14.5 % (ref 11.2–14.5)
WBC: 4.6 10*3/uL (ref 3.9–10.3)

## 2016-12-30 LAB — COMPREHENSIVE METABOLIC PANEL
ANION GAP: 8 meq/L (ref 3–11)
AST: 13 U/L (ref 5–34)
Albumin: 3.2 g/dL — ABNORMAL LOW (ref 3.5–5.0)
Alkaline Phosphatase: 123 U/L (ref 40–150)
BUN: 51.1 mg/dL — ABNORMAL HIGH (ref 7.0–26.0)
CALCIUM: 8.9 mg/dL (ref 8.4–10.4)
CHLORIDE: 108 meq/L (ref 98–109)
CO2: 21 meq/L — AB (ref 22–29)
Creatinine: 3.6 mg/dL (ref 0.6–1.1)
EGFR: 11 mL/min/{1.73_m2} — AB (ref >90)
Glucose: 95 mg/dl (ref 70–140)
POTASSIUM: 5.3 meq/L — AB (ref 3.5–5.1)
Sodium: 138 mEq/L (ref 136–145)
Total Bilirubin: 0.5 mg/dL (ref 0.20–1.20)
Total Protein: 6.6 g/dL (ref 6.4–8.3)

## 2016-12-30 LAB — TSH: TSH: 4.834 m[IU]/L — AB (ref 0.308–3.960)

## 2016-12-30 MED ORDER — SODIUM CHLORIDE 0.9 % IV SOLN
200.0000 mg | Freq: Once | INTRAVENOUS | Status: AC
  Administered 2016-12-30: 200 mg via INTRAVENOUS
  Filled 2016-12-30: qty 8

## 2016-12-30 MED ORDER — SODIUM CHLORIDE 0.9% FLUSH
10.0000 mL | INTRAVENOUS | Status: DC | PRN
  Administered 2016-12-30: 10 mL via INTRAVENOUS
  Filled 2016-12-30: qty 10

## 2016-12-30 MED ORDER — SODIUM CHLORIDE 0.9 % IV SOLN
Freq: Once | INTRAVENOUS | Status: AC
  Administered 2016-12-30: 10:00:00 via INTRAVENOUS

## 2016-12-30 MED ORDER — SODIUM CHLORIDE 0.9% FLUSH
10.0000 mL | INTRAVENOUS | Status: DC | PRN
  Administered 2016-12-30: 10 mL
  Filled 2016-12-30: qty 10

## 2016-12-30 MED ORDER — HEPARIN SOD (PORK) LOCK FLUSH 100 UNIT/ML IV SOLN
500.0000 [IU] | Freq: Once | INTRAVENOUS | Status: AC | PRN
  Administered 2016-12-30: 500 [IU]
  Filled 2016-12-30: qty 5

## 2016-12-30 NOTE — Progress Notes (Signed)
[facility] [phone]   [phone]  OFFICE PROGRESS NOTE  Celene Squibb, MD [place] [address]  PRINCIPAL DIAGNOSIS: Stage IV non-small cell lung cancer diagnosed in March 2007, with disease recurrence in June 2016 with positive PDL 1 expression (50%).   PRIOR THERAPY:  1) Status post 6 cycles of systemic chemotherapy with carboplatin and docetaxel. Last dose was given January 16, 2006.  2) Palliative radiotherapy to the large right paraspinous mass with osseous invasion centered at L5 under the care of Dr. Valere Dross. 3) Systemic chemotherapy with carboplatin for AUC of 5 and paclitaxel 175 MG/M2 every 3 weeks with Neulasta support. First dose expected on 02/02/2015. Status post 2 cycles, last dose was given 02/23/2015 discontinued secondary to intolerance and mild disease progression. 4) Ketruda 200 mg IV every 3 weeks. First dose 04/14/2015. Status post 3 cycles. Treatment was discontinued after the patient had several hospitalization with recurrent infection and back abscess but were unrelated to her treatment.  CURRENT THERAPY: Retreatment with Ketruda 200 mg IV every 3 weeks status post 4 cycles.  INTERVAL HISTORY: [name] 83 y.o. female returns to the clinic today for follow-up visit accompanied by her daughter. The patient is feeling fine today with no specific complaints. She continues to tolerate her treatment with [name] fairly well. She has some issues with Eliquis that was prescribed for her stroke and the patient decided to discontinue it last week. She will contact her neurologist for reevaluation and recommendation. She denied having any chest pain, shortness of breath, cough or hemoptysis. She denied having any fever or chills. She has no nausea, vomiting, diarrhea or constipation. She is here today for evaluation before starting cycle #5.  MEDICAL HISTORY: Past Medical History:  Diagnosis Date  . A-fib  ([place])   . Chronic anemia   . Chronic diastolic (congestive) heart failure ([place])   . Chronic fatigue 04/04/2015  . Chronic fatigue 04/04/2015  . CKD (chronic kidney disease)   . Coronary artery disease    a.  LHC (06/04/05): LHC done in [place] with high grade RCA => s/p BMS to RCA;  b.  Nuclear (09/14/09): Lexiscan; Inf infarct with mild peri-infarct ishemia, EF 52%; Low Risk.  Marland Kitchen DVT (deep venous thrombosis) ([place])   . Foot drop, right   . GERD (gastroesophageal reflux disease)   . Goals of care, counseling/discussion 09/30/2016  . Hypercholesterolemia   . Hypertension   . Ischemic cardiomyopathy    a. Echo (07/26/13): Mild LVH, EF 35-40%, diff HK, inf AK, Gr 2 DD, Tr AI, mildly dilated Ao root, MAC, mild MR, mild LAE, mod reduced RVSF.  . Non-small cell carcinoma of lung ([place])    Stage IV;spinal cancer; had chemo and radiation and immune therapy    ALLERGIES:  is allergic to amlodipine; ciprofloxacin; mirtazapine; statins; aspirin; cephalexin; hydralazine; iron; ambien [zolpidem tartrate]; lorazepam; sulfonamide derivatives; zolpidem; latex; penicillins; shellfish allergy; and tape.  MEDICATIONS:  Current Outpatient Prescriptions  Medication Sig Dispense Refill  . acetaminophen (TYLENOL) 500 MG tablet Take 500 mg by mouth daily as needed for mild pain.    . metoprolol tartrate (LOPRESSOR) 25 MG tablet Take 0.5 tablets (12.5 mg total) by mouth at bedtime. 30 tablet 0  . pantoprazole (PROTONIX) 40 MG tablet Take 40 mg by mouth daily before breakfast.     . Pembrolizumab (KEYTRUDA IV) Inject into the vein every 21 ( twenty-one) days.    . diazepam (VALIUM)  2 MG tablet Take 1 tablet (2 mg total) by mouth See admin instructions. Take 1 tab 1 hour prior to MRI. May repeat X1 after 1 hour if not effective. (Patient not taking: Reported on 12/09/2016) 2 tablet 0  . diphenoxylate-atropine (LOMOTIL) 2.5-0.025 MG tablet Take 1 tablet by mouth 4 (four) times daily as needed for diarrhea or loose  stools. Reported on 11/28/2015    . ELIQUIS 2.5 MG TABS tablet TAKE (1) TABLET BY MOUTH TWICE DAILY. (Patient not taking: Reported on 12/30/2016) 60 tablet 0  . furosemide (LASIX) 40 MG tablet Take 40 mg by mouth daily as needed for fluid or edema.     Marland Kitchen loperamide (IMODIUM) 2 MG capsule Take 1 capsule (2 mg total) by mouth every 6 (six) hours as needed for diarrhea or loose stools. (Patient not taking: Reported on 12/30/2016) 30 capsule 0  . senna-docusate (SENOKOT-S) 8.6-50 MG tablet Take 1 tablet by mouth at bedtime as needed for mild constipation. (Patient not taking: Reported on 12/09/2016) 30 tablet 0   No current facility-administered medications for this visit.    Facility-Administered Medications Ordered in Other Visits  Medication Dose Route Frequency Provider Last Rate Last Dose  . heparin lock flush 100 unit/mL  500 Units Intracatheter Once PRN Curt Bears, MD      . sodium chloride flush (NS) 0.9 % injection 10 mL  10 mL Intracatheter PRN Curt Bears, MD        SURGICAL HISTORY:  Past Surgical History:  Procedure Laterality Date  . ABDOMINAL HYSTERECTOMY    . APPENDECTOMY    . CATARACT EXTRACTION Bilateral   . CHOLECYSTECTOMY  2011  . CYSTOSCOPY     with stent placement  . CYSTOSCOPY W/ URETERAL STENT PLACEMENT Right 12/12/2015   Procedure: CYSTOSCOPY WITH RETROGRADE PYELOGRAM WITH STENT EXCHANGE;  Surgeon: Franchot Gallo, MD;  Location: [facility];  Service: Urology;  Laterality: Right;  . ERCP  2011   with stone extraction, done same day as cholecystectomy  . HEMATOMA EVACUATION  December 2006   groin  . HEMORROIDECTOMY    . LAMINECTOMY    . PORTACATH PLACEMENT    . TONSILLECTOMY      REVIEW OF SYSTEMS:  A comprehensive review of systems was negative except for: Constitutional: positive for fatigue Musculoskeletal: positive for muscle weakness   PHYSICAL EXAMINATION: General appearance: alert, cooperative, fatigued and no distress Head: Normocephalic, without  obvious abnormality, atraumatic Neck: no adenopathy Lymph nodes: Cervical, supraclavicular, and axillary nodes normal. Resp: clear to auscultation bilaterally Back: symmetric, no curvature. ROM normal. No CVA tenderness. Cardio: regular rate and rhythm, S1, S2 normal, no murmur, click, rub or gallop GI: soft, non-tender; bowel sounds normal; no masses,  no organomegaly Extremities: extremities normal, atraumatic, no cyanosis or edema  ECOG PERFORMANCE STATUS: 2 - Symptomatic, <50% confined to bed  Blood pressure (!) 154/83, pulse 63, temperature 97.8 F (36.6 C), temperature source Oral, resp. rate 18, height _0  (1.727 m), weight 124 lb 4.8 oz (56.4 kg), SpO2 92 %.  LABORATORY DATA: Lab Results  Component Value Date   WBC 4.6 12/30/2016   HGB 10.5 (L) 12/30/2016   HCT 33.3 (L) 12/30/2016   MCV 90.7 12/30/2016   PLT 196 12/30/2016      Chemistry      Component Value Date/Time   NA 139 12/09/2016 1120   K 5.0 12/09/2016 1120   CL 113 (H) 11/03/2016 0426   CL 104 06/19/2012 0958   CO2 21 (L) 12/09/2016  1120   BUN 52.1 (H) 12/09/2016 1120   CREATININE 3.5 (HH) 12/09/2016 1120      Component Value Date/Time   CALCIUM 8.8 12/09/2016 1120   ALKPHOS 124 12/09/2016 1120   AST 12 12/09/2016 1120   ALT 10 12/09/2016 1120   BILITOT 0.48 12/09/2016 1120       RADIOGRAPHIC STUDIES: Ct Abdomen Pelvis Wo Contrast  Result Date: 12/06/2016 CLINICAL DATA:  Followup lung squamous cell carcinoma metastatic to bone. On immunotherapy. Acute renal failure superimposed on stage 3 chronic kidney disease. EXAM: CT CHEST, ABDOMEN AND PELVIS WITHOUT CONTRAST TECHNIQUE: Multidetector CT imaging of the chest, abdomen and pelvis was performed following the standard protocol without IV contrast. COMPARISON:  09/27/2016 FINDINGS: CT CHEST FINDINGS Cardiovascular: No acute findings. Aortic and coronary artery atherosclerosis. 4.3 cm ascending aortic aneurysm remains stable in size when measured at  same location on both exams. Stable mild cardiomegaly, without pericardial effusion. Mediastinum/Lymph Nodes: No masses or pathologically enlarged lymph nodes identified on this unenhanced exam. Stable sub-cm mediastinal lymph nodes. Lungs/Pleura: Small right pleural effusion and tiny left pleural effusion and right lower lobe atelectasis have increased since previous study. Mild emphysema again noted. Spiculated nodule in the peripheral left upper lobe on image 58/4 measures 9 x 7 mm compared to 10 x 8 mm previously . Other scattered bilateral sub-cm pulmonary nodules show no significant change, largest in the right midlung measuring 6 mm on image 82/4. No new or enlarging pulmonary nodules or masses are identified. No evidence of pulmonary infiltrate. Musculoskeletal:  No suspicious bone lesions identified. CT ABDOMEN AND PELVIS FINDINGS Hepatobiliary: No masses visualized on this unenhanced exam. Prior cholecystectomy. No evidence of biliary dilatation. Pancreas: No mass or inflammatory changes identified on this unenhanced exam. Spleen: Within normal limits in size. Adrenals/Urinary Tract: No adrenal mass identified. Probable small right renal cysts are stable. Tiny nonobstructing calculi are seen in both kidneys measuring 1-2 mm. Right internal ureteral stent is seen in appropriate position and there is no evidence of hydronephrosis. Foley catheter in the bladder, which is decompressed. Stomach/Bowel: No evidence of obstruction, inflammatory process, or abnormal fluid collections. Colonic diverticulosis, without radiographic evidence of diverticulitis. Vascular/Lymphatic: No pathologically enlarged lymph nodes identified. No abdominal aortic aneurysm. Aortic atherosclerosis. Reproductive: Prior hysterectomy noted. Adnexal regions are unremarkable in appearance. Other:  None. Musculoskeletal: Mixed lytic and sclerotic bone metastases involving the L4 and L5 vertebral bodies and right sacrum with associated soft  tissue component remains stable. Stable old compression deformities of L5 and T11 vertebral bodies. IMPRESSION: 9 mm left upper lobe spiculated pulmonary nodule, without significant change. Other scattered sub-cm bilateral pulmonary nodules also stable. Increased small bilateral pleural effusions and right lower lobe atelectasis. Stable appearance of osseous metastatic disease at L4 -S1. Stable 4.3 cm ascending aortic aneurysm. Aortic and coronary artery atherosclerosis. Electronically Signed   By: Earle Gell M.D.   On: 12/06/2016 10:48   Ct Chest Wo Contrast  Result Date: 12/06/2016 CLINICAL DATA:  Followup lung squamous cell carcinoma metastatic to bone. On immunotherapy. Acute renal failure superimposed on stage 3 chronic kidney disease. EXAM: CT CHEST, ABDOMEN AND PELVIS WITHOUT CONTRAST TECHNIQUE: Multidetector CT imaging of the chest, abdomen and pelvis was performed following the standard protocol without IV contrast. COMPARISON:  09/27/2016 FINDINGS: CT CHEST FINDINGS Cardiovascular: No acute findings. Aortic and coronary artery atherosclerosis. 4.3 cm ascending aortic aneurysm remains stable in size when measured at same location on both exams. Stable mild cardiomegaly, without pericardial effusion. Mediastinum/Lymph Nodes: No  masses or pathologically enlarged lymph nodes identified on this unenhanced exam. Stable sub-cm mediastinal lymph nodes. Lungs/Pleura: Small right pleural effusion and tiny left pleural effusion and right lower lobe atelectasis have increased since previous study. Mild emphysema again noted. Spiculated nodule in the peripheral left upper lobe on image 58/4 measures 9 x 7 mm compared to 10 x 8 mm previously . Other scattered bilateral sub-cm pulmonary nodules show no significant change, largest in the right midlung measuring 6 mm on image 82/4. No new or enlarging pulmonary nodules or masses are identified. No evidence of pulmonary infiltrate. Musculoskeletal:  No suspicious bone  lesions identified. CT ABDOMEN AND PELVIS FINDINGS Hepatobiliary: No masses visualized on this unenhanced exam. Prior cholecystectomy. No evidence of biliary dilatation. Pancreas: No mass or inflammatory changes identified on this unenhanced exam. Spleen: Within normal limits in size. Adrenals/Urinary Tract: No adrenal mass identified. Probable small right renal cysts are stable. Tiny nonobstructing calculi are seen in both kidneys measuring 1-2 mm. Right internal ureteral stent is seen in appropriate position and there is no evidence of hydronephrosis. Foley catheter in the bladder, which is decompressed. Stomach/Bowel: No evidence of obstruction, inflammatory process, or abnormal fluid collections. Colonic diverticulosis, without radiographic evidence of diverticulitis. Vascular/Lymphatic: No pathologically enlarged lymph nodes identified. No abdominal aortic aneurysm. Aortic atherosclerosis. Reproductive: Prior hysterectomy noted. Adnexal regions are unremarkable in appearance. Other:  None. Musculoskeletal: Mixed lytic and sclerotic bone metastases involving the L4 and L5 vertebral bodies and right sacrum with associated soft tissue component remains stable. Stable old compression deformities of L5 and T11 vertebral bodies. IMPRESSION: 9 mm left upper lobe spiculated pulmonary nodule, without significant change. Other scattered sub-cm bilateral pulmonary nodules also stable. Increased small bilateral pleural effusions and right lower lobe atelectasis. Stable appearance of osseous metastatic disease at L4 -S1. Stable 4.3 cm ascending aortic aneurysm. Aortic and coronary artery atherosclerosis. Electronically Signed   By: Earle Gell M.D.   On: 12/06/2016 10:48    ASSESSMENT AND PLAN:  This is a very pleasant 83 years old white female with a stage IV non-small cell lung cancer initially diagnosed in March 2007 status post systemic chemotherapy with carboplatin and docetaxel for 6 cycles and was on observation  for 9 years. She developed disease progression with metastatic bone disease in the lower thoracic and lumbar spines. The patient was treated with palliative radiotherapy followed by 2 cycles of systemic chemotherapy with carboplatin and paclitaxel discontinued secondary to intolerance. She is currently on treatment with immunotherapy with [place] status post 4 cycles and she is currently tolerating this treatment fairly well with no significant adverse effects. I recommended for the patient to proceed with cycle #5 today as a scheduled. I will see her back for follow-up visit in 3 weeks for reevaluation before starting cycle #6. Regarding the treatment for her stroke, I strongly recommend for the patient to contact her neurologist for evaluation and recommendation. She was advised to call immediately if she has any concerning symptoms in the interval. All questions were answered. The patient knows to call the clinic with any problems, questions or concerns. We can certainly see the patient much sooner if necessary.  Disclaimer: This note was dictated with voice recognition software. Similar sounding words can inadvertently be transcribed and may not be corrected upon review.

## 2016-12-30 NOTE — Patient Instructions (Signed)
[facility] Discharge Instructions for Patients Receiving Chemotherapy  Today you received the following chemotherapy agents: Keytruda   To help prevent nausea and vomiting after your treatment, we encourage you to take your nausea medication as directed    If you develop nausea and vomiting that is not controlled by your nausea medication, call the clinic.   BELOW ARE SYMPTOMS THAT SHOULD BE REPORTED IMMEDIATELY:  *FEVER GREATER THAN 100.5 F  *CHILLS WITH OR WITHOUT FEVER  NAUSEA AND VOMITING THAT IS NOT CONTROLLED WITH YOUR NAUSEA MEDICATION  *UNUSUAL SHORTNESS OF BREATH  *UNUSUAL BRUISING OR BLEEDING  TENDERNESS IN MOUTH AND THROAT WITH OR WITHOUT PRESENCE OF ULCERS  *URINARY PROBLEMS  *BOWEL PROBLEMS  UNUSUAL RASH Items with * indicate a potential emergency and should be followed up as soon as possible.  Feel free to call the clinic you have any questions or concerns. The clinic phone number is [phone].  Please show the CHEMO ALERT CARD at check-in to the Emergency Department and triage nurse.   

## 2016-12-30 NOTE — Patient Instructions (Signed)
Implanted Port Home Guide An implanted port is a type of central line that is placed under the skin. Central lines are used to provide IV access when treatment or nutrition needs to be given through a person's veins. Implanted ports are used for long-term IV access. An implanted port may be placed because:  You need IV medicine that would be irritating to the small veins in your hands or arms.  You need long-term IV medicines, such as antibiotics.  You need IV nutrition for a long period.  You need frequent blood draws for lab tests.  You need dialysis.  Implanted ports are usually placed in the chest area, but they can also be placed in the upper arm, the abdomen, or the leg. An implanted port has two main parts:  Reservoir. The reservoir is round and will appear as a small, raised area under your skin. The reservoir is the part where a needle is inserted to give medicines or draw blood.  Catheter. The catheter is a thin, flexible tube that extends from the reservoir. The catheter is placed into a large vein. Medicine that is inserted into the reservoir goes into the catheter and then into the vein.  How will I care for my incision site? Do not get the incision site wet. Bathe or shower as directed by your health care provider. How is my port accessed? Special steps must be taken to access the port:  Before the port is accessed, a numbing cream can be placed on the skin. This helps numb the skin over the port site.  Your health care provider uses a sterile technique to access the port. ? Your health care provider must put on a mask and sterile gloves. ? The skin over your port is cleaned carefully with an antiseptic and allowed to dry. ? The port is gently pinched between sterile gloves, and a needle is inserted into the port.  Only "non-coring" port needles should be used to access the port. Once the port is accessed, a blood return should be checked. This helps ensure that the port  is in the vein and is not clogged.  If your port needs to remain accessed for a constant infusion, a clear (transparent) bandage will be placed over the needle site. The bandage and needle will need to be changed every week, or as directed by your health care provider.  Keep the bandage covering the needle clean and dry. Do not get it wet. Follow your health care provider's instructions on how to take a shower or bath while the port is accessed.  If your port does not need to stay accessed, no bandage is needed over the port.  What is flushing? Flushing helps keep the port from getting clogged. Follow your health care provider's instructions on how and when to flush the port. Ports are usually flushed with saline solution or a medicine called heparin. The need for flushing will depend on how the port is used.  If the port is used for intermittent medicines or blood draws, the port will need to be flushed: ? After medicines have been given. ? After blood has been drawn. ? As part of routine maintenance.  If a constant infusion is running, the port may not need to be flushed.  How long will my port stay implanted? The port can stay in for as long as your health care provider thinks it is needed. When it is time for the port to come out, surgery will be   done to remove it. The procedure is similar to the one performed when the port was put in. When should I seek immediate medical care? When you have an implanted port, you should seek immediate medical care if:  You notice a bad smell coming from the incision site.  You have swelling, redness, or drainage at the incision site.  You have more swelling or pain at the port site or the surrounding area.  You have a fever that is not controlled with medicine.  This information is not intended to replace advice given to you by your health care provider. Make sure you discuss any questions you have with your health care provider. Document  Released: 05/20/2005 Document Revised: 10/26/2015 Document Reviewed: 01/25/2013 Elsevier Interactive Patient Education  2017 [employer]  

## 2016-12-30 NOTE — Progress Notes (Signed)
Per Diane desk RN ok to tx today with Creatinine of 3.6

## 2016-12-30 NOTE — Progress Notes (Signed)
Nutrition follow-up completed with patient during infusion for metastatic lung cancer. Weight improved documented as 124.3 pounds from 121 pounds. Patient does have lower leg edema which could be causing weight gain. Patient reports her appetite is good.  She thinks she is eating more than she was. She denies nutrition impact symptoms.  Nutrition diagnosis:  Unintended weight loss improved, however this could be fluid retention.  Intervention: Educated patient to consume high-protein foods to help with fluid retention. Encouraged high-calorie, high-protein foods and oral nutrition supplements. Questions were answered.  Teach back method used.  Monitoring, evaluation, goals: Patient will tolerate increased calories and protein to minimize loss of lean body mass.  Next visit: Monday, August 20, during infusion.  **Disclaimer: This note was dictated with voice recognition software. Similar sounding words can inadvertently be transcribed and this note may contain transcription errors which may not have been corrected upon publication of note.**

## 2016-12-31 DIAGNOSIS — I5032 Chronic diastolic (congestive) heart failure: Secondary | ICD-10-CM | POA: Diagnosis not present

## 2016-12-31 DIAGNOSIS — I255 Ischemic cardiomyopathy: Secondary | ICD-10-CM | POA: Diagnosis not present

## 2016-12-31 DIAGNOSIS — I2510 Atherosclerotic heart disease of native coronary artery without angina pectoris: Secondary | ICD-10-CM | POA: Diagnosis not present

## 2016-12-31 DIAGNOSIS — C7951 Secondary malignant neoplasm of bone: Secondary | ICD-10-CM | POA: Diagnosis not present

## 2016-12-31 DIAGNOSIS — E7800 Pure hypercholesterolemia, unspecified: Secondary | ICD-10-CM | POA: Diagnosis not present

## 2016-12-31 DIAGNOSIS — D631 Anemia in chronic kidney disease: Secondary | ICD-10-CM | POA: Diagnosis not present

## 2016-12-31 DIAGNOSIS — I130 Hypertensive heart and chronic kidney disease with heart failure and stage 1 through stage 4 chronic kidney disease, or unspecified chronic kidney disease: Secondary | ICD-10-CM | POA: Diagnosis not present

## 2016-12-31 DIAGNOSIS — I69320 Aphasia following cerebral infarction: Secondary | ICD-10-CM | POA: Diagnosis not present

## 2016-12-31 DIAGNOSIS — I4891 Unspecified atrial fibrillation: Secondary | ICD-10-CM | POA: Diagnosis not present

## 2016-12-31 DIAGNOSIS — Z466 Encounter for fitting and adjustment of urinary device: Secondary | ICD-10-CM | POA: Diagnosis not present

## 2016-12-31 DIAGNOSIS — N183 Chronic kidney disease, stage 3 (moderate): Secondary | ICD-10-CM | POA: Diagnosis not present

## 2016-12-31 DIAGNOSIS — C3490 Malignant neoplasm of unspecified part of unspecified bronchus or lung: Secondary | ICD-10-CM | POA: Diagnosis not present

## 2016-12-31 DIAGNOSIS — G8311 Monoplegia of lower limb affecting right dominant side: Secondary | ICD-10-CM | POA: Diagnosis not present

## 2017-01-01 DIAGNOSIS — C7951 Secondary malignant neoplasm of bone: Secondary | ICD-10-CM | POA: Diagnosis not present

## 2017-01-01 DIAGNOSIS — G8311 Monoplegia of lower limb affecting right dominant side: Secondary | ICD-10-CM | POA: Diagnosis not present

## 2017-01-01 DIAGNOSIS — I130 Hypertensive heart and chronic kidney disease with heart failure and stage 1 through stage 4 chronic kidney disease, or unspecified chronic kidney disease: Secondary | ICD-10-CM | POA: Diagnosis not present

## 2017-01-01 DIAGNOSIS — I2510 Atherosclerotic heart disease of native coronary artery without angina pectoris: Secondary | ICD-10-CM | POA: Diagnosis not present

## 2017-01-01 DIAGNOSIS — I5032 Chronic diastolic (congestive) heart failure: Secondary | ICD-10-CM | POA: Diagnosis not present

## 2017-01-01 DIAGNOSIS — Z466 Encounter for fitting and adjustment of urinary device: Secondary | ICD-10-CM | POA: Diagnosis not present

## 2017-01-01 DIAGNOSIS — N183 Chronic kidney disease, stage 3 (moderate): Secondary | ICD-10-CM | POA: Diagnosis not present

## 2017-01-01 DIAGNOSIS — I255 Ischemic cardiomyopathy: Secondary | ICD-10-CM | POA: Diagnosis not present

## 2017-01-01 DIAGNOSIS — C3490 Malignant neoplasm of unspecified part of unspecified bronchus or lung: Secondary | ICD-10-CM | POA: Diagnosis not present

## 2017-01-01 DIAGNOSIS — E7800 Pure hypercholesterolemia, unspecified: Secondary | ICD-10-CM | POA: Diagnosis not present

## 2017-01-01 DIAGNOSIS — I4891 Unspecified atrial fibrillation: Secondary | ICD-10-CM | POA: Diagnosis not present

## 2017-01-01 DIAGNOSIS — I69320 Aphasia following cerebral infarction: Secondary | ICD-10-CM | POA: Diagnosis not present

## 2017-01-01 DIAGNOSIS — D631 Anemia in chronic kidney disease: Secondary | ICD-10-CM | POA: Diagnosis not present

## 2017-01-02 DIAGNOSIS — C3490 Malignant neoplasm of unspecified part of unspecified bronchus or lung: Secondary | ICD-10-CM | POA: Diagnosis not present

## 2017-01-02 DIAGNOSIS — I4891 Unspecified atrial fibrillation: Secondary | ICD-10-CM | POA: Diagnosis not present

## 2017-01-02 DIAGNOSIS — I69320 Aphasia following cerebral infarction: Secondary | ICD-10-CM | POA: Diagnosis not present

## 2017-01-02 DIAGNOSIS — D631 Anemia in chronic kidney disease: Secondary | ICD-10-CM | POA: Diagnosis not present

## 2017-01-02 DIAGNOSIS — C7951 Secondary malignant neoplasm of bone: Secondary | ICD-10-CM | POA: Diagnosis not present

## 2017-01-02 DIAGNOSIS — I130 Hypertensive heart and chronic kidney disease with heart failure and stage 1 through stage 4 chronic kidney disease, or unspecified chronic kidney disease: Secondary | ICD-10-CM | POA: Diagnosis not present

## 2017-01-02 DIAGNOSIS — I5032 Chronic diastolic (congestive) heart failure: Secondary | ICD-10-CM | POA: Diagnosis not present

## 2017-01-02 DIAGNOSIS — I255 Ischemic cardiomyopathy: Secondary | ICD-10-CM | POA: Diagnosis not present

## 2017-01-02 DIAGNOSIS — E7800 Pure hypercholesterolemia, unspecified: Secondary | ICD-10-CM | POA: Diagnosis not present

## 2017-01-02 DIAGNOSIS — I2510 Atherosclerotic heart disease of native coronary artery without angina pectoris: Secondary | ICD-10-CM | POA: Diagnosis not present

## 2017-01-02 DIAGNOSIS — G8311 Monoplegia of lower limb affecting right dominant side: Secondary | ICD-10-CM | POA: Diagnosis not present

## 2017-01-02 DIAGNOSIS — N183 Chronic kidney disease, stage 3 (moderate): Secondary | ICD-10-CM | POA: Diagnosis not present

## 2017-01-02 DIAGNOSIS — Z466 Encounter for fitting and adjustment of urinary device: Secondary | ICD-10-CM | POA: Diagnosis not present

## 2017-01-03 DIAGNOSIS — G8311 Monoplegia of lower limb affecting right dominant side: Secondary | ICD-10-CM | POA: Diagnosis not present

## 2017-01-03 DIAGNOSIS — I255 Ischemic cardiomyopathy: Secondary | ICD-10-CM | POA: Diagnosis not present

## 2017-01-03 DIAGNOSIS — Z466 Encounter for fitting and adjustment of urinary device: Secondary | ICD-10-CM | POA: Diagnosis not present

## 2017-01-03 DIAGNOSIS — I130 Hypertensive heart and chronic kidney disease with heart failure and stage 1 through stage 4 chronic kidney disease, or unspecified chronic kidney disease: Secondary | ICD-10-CM | POA: Diagnosis not present

## 2017-01-03 DIAGNOSIS — I4891 Unspecified atrial fibrillation: Secondary | ICD-10-CM | POA: Diagnosis not present

## 2017-01-03 DIAGNOSIS — I69320 Aphasia following cerebral infarction: Secondary | ICD-10-CM | POA: Diagnosis not present

## 2017-01-03 DIAGNOSIS — E7800 Pure hypercholesterolemia, unspecified: Secondary | ICD-10-CM | POA: Diagnosis not present

## 2017-01-03 DIAGNOSIS — I2510 Atherosclerotic heart disease of native coronary artery without angina pectoris: Secondary | ICD-10-CM | POA: Diagnosis not present

## 2017-01-03 DIAGNOSIS — N183 Chronic kidney disease, stage 3 (moderate): Secondary | ICD-10-CM | POA: Diagnosis not present

## 2017-01-03 DIAGNOSIS — C7951 Secondary malignant neoplasm of bone: Secondary | ICD-10-CM | POA: Diagnosis not present

## 2017-01-03 DIAGNOSIS — D631 Anemia in chronic kidney disease: Secondary | ICD-10-CM | POA: Diagnosis not present

## 2017-01-03 DIAGNOSIS — C3490 Malignant neoplasm of unspecified part of unspecified bronchus or lung: Secondary | ICD-10-CM | POA: Diagnosis not present

## 2017-01-03 DIAGNOSIS — I5032 Chronic diastolic (congestive) heart failure: Secondary | ICD-10-CM | POA: Diagnosis not present

## 2017-01-07 ENCOUNTER — Ambulatory Visit (INDEPENDENT_AMBULATORY_CARE_PROVIDER_SITE_OTHER): Payer: Medicare Other | Admitting: Neurology

## 2017-01-07 ENCOUNTER — Encounter: Payer: Self-pay | Admitting: Neurology

## 2017-01-07 VITALS — BP 160/84 | HR 70 | Ht 68.0 in | Wt 122.6 lb

## 2017-01-07 DIAGNOSIS — G8311 Monoplegia of lower limb affecting right dominant side: Secondary | ICD-10-CM | POA: Diagnosis not present

## 2017-01-07 DIAGNOSIS — I480 Paroxysmal atrial fibrillation: Secondary | ICD-10-CM | POA: Diagnosis not present

## 2017-01-07 DIAGNOSIS — E7800 Pure hypercholesterolemia, unspecified: Secondary | ICD-10-CM | POA: Diagnosis not present

## 2017-01-07 DIAGNOSIS — I4891 Unspecified atrial fibrillation: Secondary | ICD-10-CM | POA: Diagnosis not present

## 2017-01-07 DIAGNOSIS — I130 Hypertensive heart and chronic kidney disease with heart failure and stage 1 through stage 4 chronic kidney disease, or unspecified chronic kidney disease: Secondary | ICD-10-CM | POA: Diagnosis not present

## 2017-01-07 DIAGNOSIS — D631 Anemia in chronic kidney disease: Secondary | ICD-10-CM | POA: Diagnosis not present

## 2017-01-07 DIAGNOSIS — Z466 Encounter for fitting and adjustment of urinary device: Secondary | ICD-10-CM | POA: Diagnosis not present

## 2017-01-07 DIAGNOSIS — C3490 Malignant neoplasm of unspecified part of unspecified bronchus or lung: Secondary | ICD-10-CM | POA: Diagnosis not present

## 2017-01-07 DIAGNOSIS — C7951 Secondary malignant neoplasm of bone: Secondary | ICD-10-CM | POA: Diagnosis not present

## 2017-01-07 DIAGNOSIS — N183 Chronic kidney disease, stage 3 unspecified: Secondary | ICD-10-CM

## 2017-01-07 DIAGNOSIS — I69320 Aphasia following cerebral infarction: Secondary | ICD-10-CM | POA: Diagnosis not present

## 2017-01-07 DIAGNOSIS — I639 Cerebral infarction, unspecified: Secondary | ICD-10-CM

## 2017-01-07 DIAGNOSIS — I5032 Chronic diastolic (congestive) heart failure: Secondary | ICD-10-CM | POA: Diagnosis not present

## 2017-01-07 DIAGNOSIS — I2510 Atherosclerotic heart disease of native coronary artery without angina pectoris: Secondary | ICD-10-CM | POA: Diagnosis not present

## 2017-01-07 DIAGNOSIS — Z86718 Personal history of other venous thrombosis and embolism: Secondary | ICD-10-CM | POA: Insufficient documentation

## 2017-01-07 DIAGNOSIS — I255 Ischemic cardiomyopathy: Secondary | ICD-10-CM | POA: Diagnosis not present

## 2017-01-07 NOTE — Patient Instructions (Signed)
-   recommend coumadin for anticoagulation at this time given pt's chronic kidney function. Avoid eliquis, xarelto, lovenox due to CKD. - will contact Dr. Nevada Crane to arrange coumadin initiation and INR monitoring at home - continue follow up with oncology and cardiology - Follow up with your primary care physician for stroke risk factor modification. Recommend maintain blood pressure goal <130/80, diabetes with hemoglobin A1c goal below 7.0% and lipids with LDL cholesterol goal below 70 mg/dL.  - check BP at home twice a day and record and give to Dr. Nevada Crane for medication adjustment if needed - follow up in 3 months.

## 2017-01-07 NOTE — Progress Notes (Signed)
STROKE NEUROLOGY FOLLOW UP NOTE  NAME: [name] DOB: 30-Dec-1933  REASON FOR VISIT: stroke follow up HISTORY FROM: daughter and chart  Today we had the pleasure of seeing [name] in follow-up at our Neurology Clinic. Pt was accompanied by daughter.   History Summary [name] is a 83 y.o. female with history of stage IV lung cancer currently on chemotherapy, atrial fibrillation was on Xarelto discontinued due to hematuria, h/o multiple DVTs, HLD, CKD III, and CHF who developed confusion and speech changes on 10/30/16 am. MRI showed scattered acute left MCA infarcts, and old b/l BG and cerebellar infarcts. CUS, DVT, unremarkable. EF 40-45%. LDL 129 and A1C 5.3. UA positive for UTI. Her stroke considered afib not on AC or hypercoagulable stated due to advanced malignancy. She was put on eliquis 2.5mg  bid on discharge. She has statin allergy and recommend to consider PCSK9 inhibitor as outpt.   Interval History During the interval time, the patient has been doing well neurologically. Still on wheelchair outside house, but able to walk with walker at home. However, she developed again hematuria with eliquis 2.5mg  bid. It was held for 2-3 days, once resumed, hematuria recurred. Therefore her eliquis was down to 2.5mg  daily. However, pt started to be lethargic, lack of energy, sleepy all day long. eliquis 2.5mg  discontinued. Currently no AC for the past 10 days. BP today 160/84. She continued to follow up with oncologist Dr. Julien Nordmann and PCP Dr. Nevada Crane.    REVIEW OF SYSTEMS: Full 14 system review of systems performed and notable only for those listed below and in HPI above, all others are negative:  Constitutional:   Cardiovascular:  Ear/Nose/Throat:  Hearing loss Skin:  Eyes:   Respiratory:   Gastroitestinal:   Genitourinary:  Hematology/Lymphatic:   Endocrine:  Musculoskeletal:  Walking difficulty Allergy/Immunology:   Neurological:   Psychiatric:  Sleep:   The  following represents the patient's updated allergies and side effects list: Allergies  Allergen Reactions  . Amlodipine Swelling  . Ciprofloxacin Hives and Other (See Comments)    REACTION: weakness  . Mirtazapine Other (See Comments)    Hallucinations and nightmares, verbally aggressive   . Statins Other (See Comments)    Unknown to patient  . Aspirin Swelling    Mouth swelling & tongue Patient reports that she tolerates ibuprofen without a problem  . Cephalexin Hives  . Hydralazine Nausea And Vomiting  . Iron Nausea And Vomiting  . Ambien [Zolpidem Tartrate] Other (See Comments)    Confusion   . Eliquis [Apixaban]     Shortness of breath,blood in urine, swelling in joints  . Lorazepam Other (See Comments)    Hallucinations, verbally aggressive  . Sulfonamide Derivatives Hives  . Xarelto [Rivaroxaban]     GI bleeding  . Zolpidem     Confusion  . Latex Rash  . Penicillins Other (See Comments)    From childhood: Has patient had a PCN reaction causing immediate rash, facial/tongue/throat swelling, SOB or lightheadedness with hypotension: Yes Has patient had a PCN reaction causing severe rash involving mucus membranes or skin necrosis: Yes Has patient had a PCN reaction that required hospitalization No Has patient had a PCN reaction occurring within the last 10 years: No If all of the above answers are "NO", then may proceed with Cephalosporin use.   . Shellfish Allergy Hives and Rash  . Tape Rash    MUST USE PAPER TAPE!!!! SKIN IS VERY THIN AND WILL TEAR EASILY!!  The neurologically relevant items on the patient's problem list were reviewed on today's visit.  Neurologic Examination  A problem focused neurological exam (12 or more points of the single system neurologic examination, vital signs counts as 1 point, cranial nerves count for 8 points) was performed.  Blood pressure (!) 160/84, pulse 70, height 5\' 8"  (1.727 m), weight 122 lb 9.6 oz (55.6 kg).  General -  Well nourished, well developed, in no apparent distress.  Ophthalmologic - Sharp disc margins OU.   Cardiovascular - Regular rate and rhythm with no murmur, not in afib.  Mental Status -  Level of arousal and orientation to time, place, and person were intact. Language including expression, naming, repetition, comprehension was assessed and found intact. Fund of Knowledge was assessed and was intact.  Cranial Nerves II - XII - II - Visual field intact OU. III, IV, VI - Extraocular movements intact. V - Facial sensation intact bilaterally. VII - Facial movement intact bilaterally. VIII - Hearing & vestibular intact bilaterally. X - Palate elevates symmetrically. XI - Chin turning & shoulder shrug intact bilaterally. XII - Tongue protrusion intact.  Motor Strength - The patient's strength was 4/5 in all extremities and pronator drift was absent.  Bulk was normal and fasciculations were absent.   Motor Tone - Muscle tone was assessed at the neck and appendages and was normal.  Reflexes - The patient's reflexes were 1+ in all extremities and she had no pathological reflexes.  Sensory - Light touch, temperature/pinprick were assessed and were normal.    Coordination - The patient had normal movements in the hands with no ataxia or dysmetria.  Tremor was absent.  Gait and Station - wheelchair, gait not tested  Functional score  mRS = 3   0 - No symptoms.   1 - No significant disability. Able to carry out all usual activities, despite some symptoms.   2 - Slight disability. Able to look after own affairs without assistance, but unable to carry out all previous activities.   3 - Moderate disability. Requires some help, but able to walk unassisted.   4 - Moderately severe disability. Unable to attend to own bodily needs without assistance, and unable to walk unassisted.   5 - Severe disability. Requires constant nursing care and attention, bedridden, incontinent.   6 -  Dead.   NIH Stroke Scale = 0   Data reviewed: I personally reviewed the images and agree with the radiology interpretations.  Mr Brain Wo Contrast 10/30/2016 IMPRESSION: 1. Acute/subacute cortical and subcortical infarct involving the anterior left frontal lobe along the middle frontal gyrus. 2. Acute/subacute cortical infarct involving the left parietal lobe, potentially along the watershed territory. 3. Advanced atrophy and diffuse white matter disease compatible with chronic microvascular ischemic changes. 4. Remote lacunar infarcts involving the basal ganglia bilaterally. 5. Diffuse white matter disease into the brainstem with remote lacunar infarcts in the cerebellum bilaterally, right greater than left.   TTE 11/02/2016 - Left ventricle: The cavity size was normal. Wall thickness was increased in a pattern of mild LVH. Systolic function was mildly to moderately reduced. The estimated ejection fraction was in the range of 40% to 45%. Doppler parameters are consistent with abnormal left ventricular relaxation (grade 1 diastolic dysfunction). - Regional wall motion abnormality: Akinesis of the mid inferior and mid inferolateral myocardium. - Aortic valve: Morphologically, there appears to be mild to moderate aortic stenosis. Peak velocities and mean gradients appear to be underestimated. Moderately calcified leaflets. Cusp separation was  reduced. There was moderate regurgitation. Peak velocity (S): 192 cm/s. Mean gradient (S): 7 mm Hg. Valve area (VTI): 1.29 cm^2. Valve area (Vmax): 1.41 cm^2. Valve area (Vmean): 1.45 cm^2. - Mitral valve: Calcified annulus. There was moderate regurgitation. - Left atrium: The atrium was mildly dilated. - Right ventricle: Systolic function was mildly reduced. Lateral annulus peak S velocity: 9.41 cm/s.  LE venous doppler - no DVT  CUS - Bilateral: 1-39% ICA stenosis. Vertebral artery flow is  antegrade.  Component     Latest Ref Rng & Units 10/31/2016 11/01/2016 11/18/2016 12/09/2016  Cholesterol     0 - 200 mg/dL 185     Triglycerides     <150 mg/dL 152 (H)     HDL Cholesterol     >40 mg/dL 26 (L)     Total CHOL/HDL Ratio     RATIO 7.1     VLDL     0 - 40 mg/dL 30     LDL (calc)     0 - 99 mg/dL 129 (H)     Hemoglobin A1C     4.8 - 5.6 % 5.3     Mean Plasma Glucose     mg/dL 105     TSH     0.308 - 3.960 m(IU)/L 5.020 (H)  5.581 (H) 4.771 (H)    Assessment: As you may recall, she is a 83 y.o. Caucasian female with PMH of stage IV lung cancer currently on chemotherapy, atrial fibrillation was on Xarelto discontinued due to hematuria, h/o multiple DVTs, HLD, CKD III, and CHF admitted on 10/30/16 for scattered acute left MCA infarcts, and old b/l BG and cerebellar infarcts. CUS, DVT, unremarkable. EF 40-45%. LDL 129 and A1C 5.3. UA positive for UTI. Her stroke considered afib not on AC or hypercoagulable stated due to advanced malignancy. She was put on eliquis 2.5mg  bid on discharge. She has statin allergy and recommend to consider PCSK9 inhibitor as outpt. However, she developed again hematuria with eliquis 2.5mg  bid. It was held for 2-3 days, once resumed, hematuria recurred. Therefore her eliquis was down to 2.5mg  daily. However, pt started to be lethargic, lack of energy, sleepy all day long. eliquis 2.5mg  discontinued. Currently no AC. She continued to follow up with oncologist and PCP.  Hematuria while on Xarelto and eliquis likely due to CKD. Her Cre 3.6. For continued [facility], the only choice at this time is coumadin with INR goal 2-3. Discussed with pt and daughter, I will send note to Dr. Nevada Crane to see if he agrees on coumadin. If so, family would like Dr. Nevada Crane to start coumadin and home health nurse to assist the INR check.   Plan:  - will contact Dr. Nevada Crane to arrange coumadin initiation and INR monitoring at home - continue follow up with oncology Dr. Carma Leaven cardiology Dr.  Burt Knack - Follow up with your primary care physician for stroke risk factor modification. Recommend maintain blood pressure goal <130/80, diabetes with hemoglobin A1c goal below 7.0% and lipids with LDL cholesterol goal below 70 mg/dL.  - check BP at home and record and give to Dr. Nevada Crane for medication adjustment if needed - may consider PCSK9 inhibitors for HLD treatment in the future.  - follow up in 3 months.   No orders of the defined types were placed in this encounter.   No orders of the defined types were placed in this encounter.   Patient Instructions  - recommend coumadin for anticoagulation at this time given pt's chronic kidney  function. Avoid eliquis, xarelto, lovenox due to CKD. - will contact Dr. Nevada Crane to arrange coumadin initiation and INR monitoring at home - continue follow up with oncology and cardiology - Follow up with your primary care physician for stroke risk factor modification. Recommend maintain blood pressure goal <130/80, diabetes with hemoglobin A1c goal below 7.0% and lipids with LDL cholesterol goal below 70 mg/dL.  - check BP at home twice a day and record and give to Dr. Nevada Crane for medication adjustment if needed - follow up in 3 months.    Rosalin Hawking, MD PhD [facility] [address] [place], [place] [address] [phone]

## 2017-01-08 DIAGNOSIS — I2510 Atherosclerotic heart disease of native coronary artery without angina pectoris: Secondary | ICD-10-CM | POA: Diagnosis not present

## 2017-01-08 DIAGNOSIS — Z466 Encounter for fitting and adjustment of urinary device: Secondary | ICD-10-CM | POA: Diagnosis not present

## 2017-01-08 DIAGNOSIS — I255 Ischemic cardiomyopathy: Secondary | ICD-10-CM | POA: Diagnosis not present

## 2017-01-08 DIAGNOSIS — C7951 Secondary malignant neoplasm of bone: Secondary | ICD-10-CM | POA: Diagnosis not present

## 2017-01-08 DIAGNOSIS — E7800 Pure hypercholesterolemia, unspecified: Secondary | ICD-10-CM | POA: Diagnosis not present

## 2017-01-08 DIAGNOSIS — I69320 Aphasia following cerebral infarction: Secondary | ICD-10-CM | POA: Diagnosis not present

## 2017-01-08 DIAGNOSIS — I4891 Unspecified atrial fibrillation: Secondary | ICD-10-CM | POA: Diagnosis not present

## 2017-01-08 DIAGNOSIS — N183 Chronic kidney disease, stage 3 (moderate): Secondary | ICD-10-CM | POA: Diagnosis not present

## 2017-01-08 DIAGNOSIS — I130 Hypertensive heart and chronic kidney disease with heart failure and stage 1 through stage 4 chronic kidney disease, or unspecified chronic kidney disease: Secondary | ICD-10-CM | POA: Diagnosis not present

## 2017-01-08 DIAGNOSIS — G8311 Monoplegia of lower limb affecting right dominant side: Secondary | ICD-10-CM | POA: Diagnosis not present

## 2017-01-08 DIAGNOSIS — C3490 Malignant neoplasm of unspecified part of unspecified bronchus or lung: Secondary | ICD-10-CM | POA: Diagnosis not present

## 2017-01-08 DIAGNOSIS — D631 Anemia in chronic kidney disease: Secondary | ICD-10-CM | POA: Diagnosis not present

## 2017-01-08 DIAGNOSIS — I5032 Chronic diastolic (congestive) heart failure: Secondary | ICD-10-CM | POA: Diagnosis not present

## 2017-01-09 DIAGNOSIS — I130 Hypertensive heart and chronic kidney disease with heart failure and stage 1 through stage 4 chronic kidney disease, or unspecified chronic kidney disease: Secondary | ICD-10-CM | POA: Diagnosis not present

## 2017-01-09 DIAGNOSIS — G8311 Monoplegia of lower limb affecting right dominant side: Secondary | ICD-10-CM | POA: Diagnosis not present

## 2017-01-09 DIAGNOSIS — E7800 Pure hypercholesterolemia, unspecified: Secondary | ICD-10-CM | POA: Diagnosis not present

## 2017-01-09 DIAGNOSIS — C3490 Malignant neoplasm of unspecified part of unspecified bronchus or lung: Secondary | ICD-10-CM | POA: Diagnosis not present

## 2017-01-09 DIAGNOSIS — D631 Anemia in chronic kidney disease: Secondary | ICD-10-CM | POA: Diagnosis not present

## 2017-01-09 DIAGNOSIS — Z466 Encounter for fitting and adjustment of urinary device: Secondary | ICD-10-CM | POA: Diagnosis not present

## 2017-01-09 DIAGNOSIS — N183 Chronic kidney disease, stage 3 (moderate): Secondary | ICD-10-CM | POA: Diagnosis not present

## 2017-01-09 DIAGNOSIS — C7951 Secondary malignant neoplasm of bone: Secondary | ICD-10-CM | POA: Diagnosis not present

## 2017-01-09 DIAGNOSIS — I2510 Atherosclerotic heart disease of native coronary artery without angina pectoris: Secondary | ICD-10-CM | POA: Diagnosis not present

## 2017-01-09 DIAGNOSIS — I255 Ischemic cardiomyopathy: Secondary | ICD-10-CM | POA: Diagnosis not present

## 2017-01-09 DIAGNOSIS — I69320 Aphasia following cerebral infarction: Secondary | ICD-10-CM | POA: Diagnosis not present

## 2017-01-09 DIAGNOSIS — I4891 Unspecified atrial fibrillation: Secondary | ICD-10-CM | POA: Diagnosis not present

## 2017-01-09 DIAGNOSIS — I5032 Chronic diastolic (congestive) heart failure: Secondary | ICD-10-CM | POA: Diagnosis not present

## 2017-01-09 MED ORDER — PROCHLORPERAZINE MALEATE 10 MG PO TABS
ORAL_TABLET | ORAL | Status: AC
  Filled 2017-01-09: qty 1

## 2017-01-10 DIAGNOSIS — G8311 Monoplegia of lower limb affecting right dominant side: Secondary | ICD-10-CM | POA: Diagnosis not present

## 2017-01-10 DIAGNOSIS — Z466 Encounter for fitting and adjustment of urinary device: Secondary | ICD-10-CM | POA: Diagnosis not present

## 2017-01-10 DIAGNOSIS — N183 Chronic kidney disease, stage 3 (moderate): Secondary | ICD-10-CM | POA: Diagnosis not present

## 2017-01-10 DIAGNOSIS — I2510 Atherosclerotic heart disease of native coronary artery without angina pectoris: Secondary | ICD-10-CM | POA: Diagnosis not present

## 2017-01-10 DIAGNOSIS — E7800 Pure hypercholesterolemia, unspecified: Secondary | ICD-10-CM | POA: Diagnosis not present

## 2017-01-10 DIAGNOSIS — C7951 Secondary malignant neoplasm of bone: Secondary | ICD-10-CM | POA: Diagnosis not present

## 2017-01-10 DIAGNOSIS — I4891 Unspecified atrial fibrillation: Secondary | ICD-10-CM | POA: Diagnosis not present

## 2017-01-10 DIAGNOSIS — D631 Anemia in chronic kidney disease: Secondary | ICD-10-CM | POA: Diagnosis not present

## 2017-01-10 DIAGNOSIS — I255 Ischemic cardiomyopathy: Secondary | ICD-10-CM | POA: Diagnosis not present

## 2017-01-10 DIAGNOSIS — C3490 Malignant neoplasm of unspecified part of unspecified bronchus or lung: Secondary | ICD-10-CM | POA: Diagnosis not present

## 2017-01-10 DIAGNOSIS — I69320 Aphasia following cerebral infarction: Secondary | ICD-10-CM | POA: Diagnosis not present

## 2017-01-10 DIAGNOSIS — I5032 Chronic diastolic (congestive) heart failure: Secondary | ICD-10-CM | POA: Diagnosis not present

## 2017-01-10 DIAGNOSIS — I130 Hypertensive heart and chronic kidney disease with heart failure and stage 1 through stage 4 chronic kidney disease, or unspecified chronic kidney disease: Secondary | ICD-10-CM | POA: Diagnosis not present

## 2017-01-14 DIAGNOSIS — D631 Anemia in chronic kidney disease: Secondary | ICD-10-CM | POA: Diagnosis not present

## 2017-01-14 DIAGNOSIS — I69320 Aphasia following cerebral infarction: Secondary | ICD-10-CM | POA: Diagnosis not present

## 2017-01-14 DIAGNOSIS — C3490 Malignant neoplasm of unspecified part of unspecified bronchus or lung: Secondary | ICD-10-CM | POA: Diagnosis not present

## 2017-01-14 DIAGNOSIS — N183 Chronic kidney disease, stage 3 (moderate): Secondary | ICD-10-CM | POA: Diagnosis not present

## 2017-01-14 DIAGNOSIS — I4891 Unspecified atrial fibrillation: Secondary | ICD-10-CM | POA: Diagnosis not present

## 2017-01-14 DIAGNOSIS — I130 Hypertensive heart and chronic kidney disease with heart failure and stage 1 through stage 4 chronic kidney disease, or unspecified chronic kidney disease: Secondary | ICD-10-CM | POA: Diagnosis not present

## 2017-01-14 DIAGNOSIS — G8311 Monoplegia of lower limb affecting right dominant side: Secondary | ICD-10-CM | POA: Diagnosis not present

## 2017-01-14 DIAGNOSIS — C7951 Secondary malignant neoplasm of bone: Secondary | ICD-10-CM | POA: Diagnosis not present

## 2017-01-14 DIAGNOSIS — Z466 Encounter for fitting and adjustment of urinary device: Secondary | ICD-10-CM | POA: Diagnosis not present

## 2017-01-14 DIAGNOSIS — I5032 Chronic diastolic (congestive) heart failure: Secondary | ICD-10-CM | POA: Diagnosis not present

## 2017-01-14 DIAGNOSIS — I2510 Atherosclerotic heart disease of native coronary artery without angina pectoris: Secondary | ICD-10-CM | POA: Diagnosis not present

## 2017-01-14 DIAGNOSIS — E7800 Pure hypercholesterolemia, unspecified: Secondary | ICD-10-CM | POA: Diagnosis not present

## 2017-01-14 DIAGNOSIS — I255 Ischemic cardiomyopathy: Secondary | ICD-10-CM | POA: Diagnosis not present

## 2017-01-15 DIAGNOSIS — E7800 Pure hypercholesterolemia, unspecified: Secondary | ICD-10-CM | POA: Diagnosis not present

## 2017-01-15 DIAGNOSIS — I4891 Unspecified atrial fibrillation: Secondary | ICD-10-CM | POA: Diagnosis not present

## 2017-01-15 DIAGNOSIS — I255 Ischemic cardiomyopathy: Secondary | ICD-10-CM | POA: Diagnosis not present

## 2017-01-15 DIAGNOSIS — I69320 Aphasia following cerebral infarction: Secondary | ICD-10-CM | POA: Diagnosis not present

## 2017-01-15 DIAGNOSIS — Z466 Encounter for fitting and adjustment of urinary device: Secondary | ICD-10-CM | POA: Diagnosis not present

## 2017-01-15 DIAGNOSIS — C3490 Malignant neoplasm of unspecified part of unspecified bronchus or lung: Secondary | ICD-10-CM | POA: Diagnosis not present

## 2017-01-15 DIAGNOSIS — I2510 Atherosclerotic heart disease of native coronary artery without angina pectoris: Secondary | ICD-10-CM | POA: Diagnosis not present

## 2017-01-15 DIAGNOSIS — I5032 Chronic diastolic (congestive) heart failure: Secondary | ICD-10-CM | POA: Diagnosis not present

## 2017-01-15 DIAGNOSIS — D631 Anemia in chronic kidney disease: Secondary | ICD-10-CM | POA: Diagnosis not present

## 2017-01-15 DIAGNOSIS — C7951 Secondary malignant neoplasm of bone: Secondary | ICD-10-CM | POA: Diagnosis not present

## 2017-01-15 DIAGNOSIS — G8311 Monoplegia of lower limb affecting right dominant side: Secondary | ICD-10-CM | POA: Diagnosis not present

## 2017-01-15 DIAGNOSIS — I130 Hypertensive heart and chronic kidney disease with heart failure and stage 1 through stage 4 chronic kidney disease, or unspecified chronic kidney disease: Secondary | ICD-10-CM | POA: Diagnosis not present

## 2017-01-15 DIAGNOSIS — N183 Chronic kidney disease, stage 3 (moderate): Secondary | ICD-10-CM | POA: Diagnosis not present

## 2017-01-17 DIAGNOSIS — I4891 Unspecified atrial fibrillation: Secondary | ICD-10-CM | POA: Diagnosis not present

## 2017-01-17 DIAGNOSIS — I130 Hypertensive heart and chronic kidney disease with heart failure and stage 1 through stage 4 chronic kidney disease, or unspecified chronic kidney disease: Secondary | ICD-10-CM | POA: Diagnosis not present

## 2017-01-17 DIAGNOSIS — G8311 Monoplegia of lower limb affecting right dominant side: Secondary | ICD-10-CM | POA: Diagnosis not present

## 2017-01-17 DIAGNOSIS — E7800 Pure hypercholesterolemia, unspecified: Secondary | ICD-10-CM | POA: Diagnosis not present

## 2017-01-17 DIAGNOSIS — I255 Ischemic cardiomyopathy: Secondary | ICD-10-CM | POA: Diagnosis not present

## 2017-01-17 DIAGNOSIS — N183 Chronic kidney disease, stage 3 (moderate): Secondary | ICD-10-CM | POA: Diagnosis not present

## 2017-01-17 DIAGNOSIS — Z466 Encounter for fitting and adjustment of urinary device: Secondary | ICD-10-CM | POA: Diagnosis not present

## 2017-01-17 DIAGNOSIS — C7951 Secondary malignant neoplasm of bone: Secondary | ICD-10-CM | POA: Diagnosis not present

## 2017-01-17 DIAGNOSIS — D631 Anemia in chronic kidney disease: Secondary | ICD-10-CM | POA: Diagnosis not present

## 2017-01-17 DIAGNOSIS — I2510 Atherosclerotic heart disease of native coronary artery without angina pectoris: Secondary | ICD-10-CM | POA: Diagnosis not present

## 2017-01-17 DIAGNOSIS — I5032 Chronic diastolic (congestive) heart failure: Secondary | ICD-10-CM | POA: Diagnosis not present

## 2017-01-17 DIAGNOSIS — I69320 Aphasia following cerebral infarction: Secondary | ICD-10-CM | POA: Diagnosis not present

## 2017-01-17 DIAGNOSIS — C3490 Malignant neoplasm of unspecified part of unspecified bronchus or lung: Secondary | ICD-10-CM | POA: Diagnosis not present

## 2017-01-20 ENCOUNTER — Ambulatory Visit (HOSPITAL_BASED_OUTPATIENT_CLINIC_OR_DEPARTMENT_OTHER): Payer: Medicare Other

## 2017-01-20 ENCOUNTER — Ambulatory Visit: Payer: Medicare Other | Admitting: Nutrition

## 2017-01-20 ENCOUNTER — Encounter: Payer: Self-pay | Admitting: Internal Medicine

## 2017-01-20 ENCOUNTER — Ambulatory Visit (HOSPITAL_BASED_OUTPATIENT_CLINIC_OR_DEPARTMENT_OTHER): Payer: Medicare Other | Admitting: Internal Medicine

## 2017-01-20 ENCOUNTER — Other Ambulatory Visit (HOSPITAL_BASED_OUTPATIENT_CLINIC_OR_DEPARTMENT_OTHER): Payer: Medicare Other

## 2017-01-20 ENCOUNTER — Ambulatory Visit: Payer: Medicare Other

## 2017-01-20 ENCOUNTER — Telehealth: Payer: Self-pay | Admitting: Internal Medicine

## 2017-01-20 VITALS — BP 155/67 | HR 69 | Temp 97.8°F | Resp 19 | Ht 68.0 in

## 2017-01-20 DIAGNOSIS — R5382 Chronic fatigue, unspecified: Secondary | ICD-10-CM | POA: Insufficient documentation

## 2017-01-20 DIAGNOSIS — Z95828 Presence of other vascular implants and grafts: Secondary | ICD-10-CM

## 2017-01-20 DIAGNOSIS — Z5112 Encounter for antineoplastic immunotherapy: Secondary | ICD-10-CM | POA: Diagnosis not present

## 2017-01-20 DIAGNOSIS — I8291 Chronic embolism and thrombosis of unspecified vein: Secondary | ICD-10-CM | POA: Diagnosis not present

## 2017-01-20 DIAGNOSIS — N183 Chronic kidney disease, stage 3 unspecified: Secondary | ICD-10-CM

## 2017-01-20 DIAGNOSIS — D638 Anemia in other chronic diseases classified elsewhere: Secondary | ICD-10-CM

## 2017-01-20 DIAGNOSIS — N189 Chronic kidney disease, unspecified: Secondary | ICD-10-CM | POA: Diagnosis not present

## 2017-01-20 DIAGNOSIS — C7951 Secondary malignant neoplasm of bone: Secondary | ICD-10-CM

## 2017-01-20 DIAGNOSIS — Z85118 Personal history of other malignant neoplasm of bronchus and lung: Secondary | ICD-10-CM

## 2017-01-20 DIAGNOSIS — Z79899 Other long term (current) drug therapy: Secondary | ICD-10-CM | POA: Diagnosis not present

## 2017-01-20 LAB — CBC WITH DIFFERENTIAL/PLATELET
BASO%: 0.5 % (ref 0.0–2.0)
BASOS ABS: 0 10*3/uL (ref 0.0–0.1)
EOS%: 4.5 % (ref 0.0–7.0)
Eosinophils Absolute: 0.2 10*3/uL (ref 0.0–0.5)
HCT: 33.4 % — ABNORMAL LOW (ref 34.8–46.6)
HGB: 10.6 g/dL — ABNORMAL LOW (ref 11.6–15.9)
LYMPH%: 8.4 % — ABNORMAL LOW (ref 14.0–49.7)
MCH: 28.4 pg (ref 25.1–34.0)
MCHC: 31.7 g/dL (ref 31.5–36.0)
MCV: 89.5 fL (ref 79.5–101.0)
MONO#: 0.5 10*3/uL (ref 0.1–0.9)
MONO%: 13.2 % (ref 0.0–14.0)
NEUT#: 3 10*3/uL (ref 1.5–6.5)
NEUT%: 73.4 % (ref 38.4–76.8)
Platelets: 175 10*3/uL (ref 145–400)
RBC: 3.73 10*6/uL (ref 3.70–5.45)
RDW: 14.3 % (ref 11.2–14.5)
WBC: 4 10*3/uL (ref 3.9–10.3)
lymph#: 0.3 10*3/uL — ABNORMAL LOW (ref 0.9–3.3)

## 2017-01-20 LAB — COMPREHENSIVE METABOLIC PANEL
ALBUMIN: 3.1 g/dL — AB (ref 3.5–5.0)
ALK PHOS: 130 U/L (ref 40–150)
ALT: 9 U/L (ref 0–55)
AST: 16 U/L (ref 5–34)
Anion Gap: 8 mEq/L (ref 3–11)
BUN: 55.7 mg/dL — ABNORMAL HIGH (ref 7.0–26.0)
CHLORIDE: 106 meq/L (ref 98–109)
CO2: 24 mEq/L (ref 22–29)
Calcium: 8.9 mg/dL (ref 8.4–10.4)
Creatinine: 3.5 mg/dL (ref 0.6–1.1)
EGFR: 12 mL/min/{1.73_m2} — AB (ref >90)
Glucose: 95 mg/dl (ref 70–140)
POTASSIUM: 5.1 meq/L (ref 3.5–5.1)
Sodium: 137 mEq/L (ref 136–145)
Total Bilirubin: 0.33 mg/dL (ref 0.20–1.20)
Total Protein: 6.9 g/dL (ref 6.4–8.3)

## 2017-01-20 LAB — TSH: TSH: 7.03 m[IU]/L — AB (ref 0.308–3.960)

## 2017-01-20 MED ORDER — HEPARIN SOD (PORK) LOCK FLUSH 100 UNIT/ML IV SOLN
500.0000 [IU] | Freq: Once | INTRAVENOUS | Status: AC | PRN
  Administered 2017-01-20: 500 [IU]
  Filled 2017-01-20: qty 5

## 2017-01-20 MED ORDER — SODIUM CHLORIDE 0.9% FLUSH
10.0000 mL | INTRAVENOUS | Status: DC | PRN
  Administered 2017-01-20 (×2): 10 mL via INTRAVENOUS
  Filled 2017-01-20: qty 10

## 2017-01-20 MED ORDER — SODIUM CHLORIDE 0.9 % IV SOLN
200.0000 mg | Freq: Once | INTRAVENOUS | Status: AC
  Administered 2017-01-20: 200 mg via INTRAVENOUS
  Filled 2017-01-20: qty 8

## 2017-01-20 MED ORDER — SODIUM CHLORIDE 0.9 % IV SOLN
Freq: Once | INTRAVENOUS | Status: AC
  Administered 2017-01-20: 11:00:00 via INTRAVENOUS

## 2017-01-20 MED ORDER — SODIUM CHLORIDE 0.9% FLUSH
10.0000 mL | INTRAVENOUS | Status: DC | PRN
  Filled 2017-01-20: qty 10

## 2017-01-20 NOTE — Progress Notes (Signed)
[place] [phone]   [phone]  OFFICE PROGRESS NOTE  Celene Squibb, MD [place] [address]  PRINCIPAL DIAGNOSIS: Stage IV non-small cell lung cancer diagnosed in March 2007, with disease recurrence in June 2016 with positive PDL 1 expression (50%).   PRIOR THERAPY:  1) Status post 6 cycles of systemic chemotherapy with carboplatin and docetaxel. Last dose was given January 16, 2006.  2) Palliative radiotherapy to the large right paraspinous mass with osseous invasion centered at L5 under the care of Dr. Valere Dross. 3) Systemic chemotherapy with carboplatin for AUC of 5 and paclitaxel 175 MG/M2 every 3 weeks with Neulasta support. First dose expected on 02/02/2015. Status post 2 cycles, last dose was given 02/23/2015 discontinued secondary to intolerance and mild disease progression. 4) Ketruda 200 mg IV every 3 weeks. First dose 04/14/2015. Status post 3 cycles. Treatment was discontinued after the patient had several hospitalization with recurrent infection and back abscess but were unrelated to her treatment.  CURRENT THERAPY: Retreatment with Ketruda 200 mg IV every 3 weeks status post 5 cycles.  INTERVAL HISTORY: [name] 83 y.o. female returns to the clinic today for follow-up visit accompanied by her daughter. The patient is feeling fine today with no specific complaints except for shortness of breath with exertion. She denied having any chest pain, cough or hemoptysis. She denied having any fever or chills. She has no nausea, vomiting, diarrhea or constipation. She continues to tolerate her treatment with [place] fairly well. She today for evaluation before starting cycle #6.  MEDICAL HISTORY: Past Medical History:  Diagnosis Date  . A-fib ([place])   . Chronic anemia   . Chronic diastolic (congestive) heart failure ([place])   . Chronic fatigue 04/04/2015  . Chronic fatigue 04/04/2015  . CKD (chronic kidney disease)     . Coronary artery disease    a.  LHC (06/04/05): LHC done in [place] with high grade RCA => s/p BMS to RCA;  b.  Nuclear (09/14/09): Lexiscan; Inf infarct with mild peri-infarct ishemia, EF 52%; Low Risk.  Marland Kitchen DVT (deep venous thrombosis) ([place])   . Foot drop, right   . GERD (gastroesophageal reflux disease)   . Goals of care, counseling/discussion 09/30/2016  . Hypercholesterolemia   . Hypertension   . Ischemic cardiomyopathy    a. Echo (07/26/13): Mild LVH, EF 35-40%, diff HK, inf AK, Gr 2 DD, Tr AI, mildly dilated Ao root, MAC, mild MR, mild LAE, mod reduced RVSF.  . Non-small cell carcinoma of lung ([place])    Stage IV;spinal cancer; had chemo and radiation and immune therapy  . Stroke [facility])     ALLERGIES:  is allergic to amlodipine; ciprofloxacin; mirtazapine; statins; aspirin; cephalexin; hydralazine; iron; ambien [zolpidem tartrate]; eliquis [apixaban]; lorazepam; sulfonamide derivatives; xarelto [rivaroxaban]; zolpidem; latex; penicillins; shellfish allergy; and tape.  MEDICATIONS:  Current Outpatient Prescriptions  Medication Sig Dispense Refill  . acetaminophen (TYLENOL) 500 MG tablet Take 500 mg by mouth daily as needed for mild pain.    . diazepam (VALIUM) 2 MG tablet Take 1 tablet (2 mg total) by mouth See admin instructions. Take 1 tab 1 hour prior to MRI. May repeat X1 after 1 hour if not effective. 2 tablet 0  . diphenoxylate-atropine (LOMOTIL) 2.5-0.025 MG tablet Take 1 tablet by mouth 4 (four) times daily as needed for diarrhea or loose stools. Reported on 11/28/2015    . ELIQUIS 2.5 MG TABS tablet TAKE (1) TABLET BY  MOUTH TWICE DAILY. 60 tablet 0  . furosemide (LASIX) 40 MG tablet Take 40 mg by mouth daily as needed for fluid or edema.     Marland Kitchen loperamide (IMODIUM) 2 MG capsule Take 1 capsule (2 mg total) by mouth every 6 (six) hours as needed for diarrhea or loose stools. 30 capsule 0  . metoprolol tartrate (LOPRESSOR) 25 MG tablet Take 0.5 tablets (12.5 mg total) by mouth  at bedtime. 30 tablet 0  . pantoprazole (PROTONIX) 40 MG tablet Take 40 mg by mouth daily before breakfast.     . Pembrolizumab (KEYTRUDA IV) Inject into the vein every 21 ( twenty-one) days.    Marland Kitchen senna-docusate (SENOKOT-S) 8.6-50 MG tablet Take 1 tablet by mouth at bedtime as needed for mild constipation. 30 tablet 0   No current facility-administered medications for this visit.    Facility-Administered Medications Ordered in Other Visits  Medication Dose Route Frequency Provider Last Rate Last Dose  . heparin lock flush 100 unit/mL  500 Units Intracatheter Once PRN Curt Bears, MD      . sodium chloride flush (NS) 0.9 % injection 10 mL  10 mL Intracatheter PRN Curt Bears, MD      . sodium chloride flush (NS) 0.9 % injection 10 mL  10 mL Intravenous PRN Curt Bears, MD   10 mL at 01/20/17 9242    SURGICAL HISTORY:  Past Surgical History:  Procedure Laterality Date  . ABDOMINAL HYSTERECTOMY    . APPENDECTOMY    . CATARACT EXTRACTION Bilateral   . CHOLECYSTECTOMY  2011  . CYSTOSCOPY     with stent placement  . CYSTOSCOPY W/ URETERAL STENT PLACEMENT Right 12/12/2015   Procedure: CYSTOSCOPY WITH RETROGRADE PYELOGRAM WITH STENT EXCHANGE;  Surgeon: Franchot Gallo, MD;  Location: [facility];  Service: Urology;  Laterality: Right;  . ERCP  2011   with stone extraction, done same day as cholecystectomy  . HEMATOMA EVACUATION  December 2006   groin  . HEMORROIDECTOMY    . LAMINECTOMY    . PORTACATH PLACEMENT    . TONSILLECTOMY      REVIEW OF SYSTEMS:  A comprehensive review of systems was negative except for: Respiratory: positive for dyspnea on exertion Musculoskeletal: positive for muscle weakness   PHYSICAL EXAMINATION: General appearance: alert, cooperative and no distress Head: Normocephalic, without obvious abnormality, atraumatic Neck: no adenopathy Lymph nodes: Cervical, supraclavicular, and axillary nodes normal. Resp: clear to auscultation bilaterally Back:  symmetric, no curvature. ROM normal. No CVA tenderness. Cardio: regular rate and rhythm, S1, S2 normal, no murmur, click, rub or gallop GI: soft, non-tender; bowel sounds normal; no masses,  no organomegaly Extremities: extremities normal, atraumatic, no cyanosis or edema  ECOG PERFORMANCE STATUS: 2 - Symptomatic, <50% confined to bed  Blood pressure (!) 155/67, pulse 69, temperature 97.8 F (36.6 C), temperature source Oral, resp. rate 19, height _0  (1.727 m), SpO2 95 %.  LABORATORY DATA: Lab Results  Component Value Date   WBC 4.0 01/20/2017   HGB 10.6 (L) 01/20/2017   HCT 33.4 (L) 01/20/2017   MCV 89.5 01/20/2017   PLT 175 01/20/2017      Chemistry      Component Value Date/Time   NA 138 12/30/2016 0845   K 5.3 (H) 12/30/2016 0845   CL 113 (H) 11/03/2016 0426   CL 104 06/19/2012 0958   CO2 21 (L) 12/30/2016 0845   BUN 51.1 (H) 12/30/2016 0845   CREATININE 3.6 (HH) 12/30/2016 0845      Component  Value Date/Time   CALCIUM 8.9 12/30/2016 0845   ALKPHOS 123 12/30/2016 0845   AST 13 12/30/2016 0845   ALT <6 12/30/2016 0845   BILITOT 0.50 12/30/2016 0845       RADIOGRAPHIC STUDIES: No results found.  ASSESSMENT AND PLAN:  This is a very pleasant 83 years old white female with a stage IV non-small cell lung cancer initially diagnosed in March 2007 status post systemic chemotherapy with carboplatin and docetaxel for 6 cycles and was on observation for 9 years. She developed disease progression with metastatic bone disease in the lower thoracic and lumbar spines. The patient was treated with palliative radiotherapy followed by 2 cycles of systemic chemotherapy with carboplatin and paclitaxel discontinued secondary to intolerance. She is currently on treatment with immunotherapy with [place] status post 5 cycles and she is currently tolerating this treatment fairly well with no significant adverse effects. I recommended for the patient to continue her current treatment  with immunotherapy with [facility] as a scheduled. She will proceed with cycle #6 today. I would see her back for follow-up visit in 3 weeks for evaluation after repeating CT scan of the chest, abdomen and pelvis for restaging of her disease. The patient was advised to call immediately if she has any concerning symptoms in the interval. All questions were answered. The patient knows to call the clinic with any problems, questions or concerns. We can certainly see the patient much sooner if necessary.  Disclaimer: This note was dictated with voice recognition software. Similar sounding words can inadvertently be transcribed and may not be corrected upon review.

## 2017-01-20 NOTE — Patient Instructions (Addendum)
[facility] Discharge Instructions for Patients Receiving Chemotherapy  Today you received the following chemotherapy agents: Keytruda   To help prevent nausea and vomiting after your treatment, we encourage you to take your nausea medication as directed    If you develop nausea and vomiting that is not controlled by your nausea medication, call the clinic.   BELOW ARE SYMPTOMS THAT SHOULD BE REPORTED IMMEDIATELY:  *FEVER GREATER THAN 100.5 F  *CHILLS WITH OR WITHOUT FEVER  NAUSEA AND VOMITING THAT IS NOT CONTROLLED WITH YOUR NAUSEA MEDICATION  *UNUSUAL SHORTNESS OF BREATH  *UNUSUAL BRUISING OR BLEEDING  TENDERNESS IN MOUTH AND THROAT WITH OR WITHOUT PRESENCE OF ULCERS  *URINARY PROBLEMS  *BOWEL PROBLEMS  UNUSUAL RASH Items with * indicate a potential emergency and should be followed up as soon as possible.  Feel free to call the clinic you have any questions or concerns. The clinic phone number is [phone].  Please show the CHEMO ALERT CARD at check-in to the Emergency Department and triage nurse.   

## 2017-01-20 NOTE — Patient Instructions (Signed)
Implanted Port Home Guide An implanted port is a type of central line that is placed under the skin. Central lines are used to provide IV access when treatment or nutrition needs to be given through a person's veins. Implanted ports are used for long-term IV access. An implanted port may be placed because:  You need IV medicine that would be irritating to the small veins in your hands or arms.  You need long-term IV medicines, such as antibiotics.  You need IV nutrition for a long period.  You need frequent blood draws for lab tests.  You need dialysis.  Implanted ports are usually placed in the chest area, but they can also be placed in the upper arm, the abdomen, or the leg. An implanted port has two main parts:  Reservoir. The reservoir is round and will appear as a small, raised area under your skin. The reservoir is the part where a needle is inserted to give medicines or draw blood.  Catheter. The catheter is a thin, flexible tube that extends from the reservoir. The catheter is placed into a large vein. Medicine that is inserted into the reservoir goes into the catheter and then into the vein.  How will I care for my incision site? Do not get the incision site wet. Bathe or shower as directed by your health care provider. How is my port accessed? Special steps must be taken to access the port:  Before the port is accessed, a numbing cream can be placed on the skin. This helps numb the skin over the port site.  Your health care provider uses a sterile technique to access the port. ? Your health care provider must put on a mask and sterile gloves. ? The skin over your port is cleaned carefully with an antiseptic and allowed to dry. ? The port is gently pinched between sterile gloves, and a needle is inserted into the port.  Only "non-coring" port needles should be used to access the port. Once the port is accessed, a blood return should be checked. This helps ensure that the port  is in the vein and is not clogged.  If your port needs to remain accessed for a constant infusion, a clear (transparent) bandage will be placed over the needle site. The bandage and needle will need to be changed every week, or as directed by your health care provider.  Keep the bandage covering the needle clean and dry. Do not get it wet. Follow your health care provider's instructions on how to take a shower or bath while the port is accessed.  If your port does not need to stay accessed, no bandage is needed over the port.  What is flushing? Flushing helps keep the port from getting clogged. Follow your health care provider's instructions on how and when to flush the port. Ports are usually flushed with saline solution or a medicine called heparin. The need for flushing will depend on how the port is used.  If the port is used for intermittent medicines or blood draws, the port will need to be flushed: ? After medicines have been given. ? After blood has been drawn. ? As part of routine maintenance.  If a constant infusion is running, the port may not need to be flushed.  How long will my port stay implanted? The port can stay in for as long as your health care provider thinks it is needed. When it is time for the port to come out, surgery will be   done to remove it. The procedure is similar to the one performed when the port was put in. When should I seek immediate medical care? When you have an implanted port, you should seek immediate medical care if:  You notice a bad smell coming from the incision site.  You have swelling, redness, or drainage at the incision site.  You have more swelling or pain at the port site or the surrounding area.  You have a fever that is not controlled with medicine.  This information is not intended to replace advice given to you by your health care provider. Make sure you discuss any questions you have with your health care provider. Document  Released: 05/20/2005 Document Revised: 10/26/2015 Document Reviewed: 01/25/2013 Elsevier Interactive Patient Education  2017 [employer]  

## 2017-01-20 NOTE — Progress Notes (Signed)
Nutrition follow-up completed with patient during infusion for metastatic lung cancer. Weight decreased and documented as 122.6 pounds August 7. Patient reports poor appetite but she is trying to eat. She drinks between 1 and 2 bottles boost plus a day.  Nutrition diagnosis: Unintended weight loss continues.  Intervention: Educated patient to increase boost + to 2-3 bottles daily. Encouraged patient to try to eat small frequent meals and snacks. Questions answered.  Teach back method used.  Monitoring, evaluation, goals: Patient will tolerate increased calories and protein to minimize loss of lean body mass.  Next visit: Monday, September 10, during infusion.  **Disclaimer: This note was dictated with voice recognition software. Similar sounding words can inadvertently be transcribed and this note may contain transcription errors which may not have been corrected upon publication of note.**

## 2017-01-20 NOTE — Progress Notes (Signed)
Per Dr. Julien Nordmann, okay to treat pt with a crt of 3.5.

## 2017-01-20 NOTE — Telephone Encounter (Signed)
Scheduled appt per 8/20 los - patient to get new schedule next visit.

## 2017-01-21 DIAGNOSIS — N183 Chronic kidney disease, stage 3 (moderate): Secondary | ICD-10-CM | POA: Diagnosis not present

## 2017-01-21 DIAGNOSIS — I4891 Unspecified atrial fibrillation: Secondary | ICD-10-CM | POA: Diagnosis not present

## 2017-01-21 DIAGNOSIS — I2510 Atherosclerotic heart disease of native coronary artery without angina pectoris: Secondary | ICD-10-CM | POA: Diagnosis not present

## 2017-01-21 DIAGNOSIS — I69320 Aphasia following cerebral infarction: Secondary | ICD-10-CM | POA: Diagnosis not present

## 2017-01-21 DIAGNOSIS — Z466 Encounter for fitting and adjustment of urinary device: Secondary | ICD-10-CM | POA: Diagnosis not present

## 2017-01-21 DIAGNOSIS — C7951 Secondary malignant neoplasm of bone: Secondary | ICD-10-CM | POA: Diagnosis not present

## 2017-01-21 DIAGNOSIS — C3490 Malignant neoplasm of unspecified part of unspecified bronchus or lung: Secondary | ICD-10-CM | POA: Diagnosis not present

## 2017-01-21 DIAGNOSIS — D631 Anemia in chronic kidney disease: Secondary | ICD-10-CM | POA: Diagnosis not present

## 2017-01-21 DIAGNOSIS — E7800 Pure hypercholesterolemia, unspecified: Secondary | ICD-10-CM | POA: Diagnosis not present

## 2017-01-21 DIAGNOSIS — I130 Hypertensive heart and chronic kidney disease with heart failure and stage 1 through stage 4 chronic kidney disease, or unspecified chronic kidney disease: Secondary | ICD-10-CM | POA: Diagnosis not present

## 2017-01-21 DIAGNOSIS — I5032 Chronic diastolic (congestive) heart failure: Secondary | ICD-10-CM | POA: Diagnosis not present

## 2017-01-21 DIAGNOSIS — I255 Ischemic cardiomyopathy: Secondary | ICD-10-CM | POA: Diagnosis not present

## 2017-01-21 DIAGNOSIS — G8311 Monoplegia of lower limb affecting right dominant side: Secondary | ICD-10-CM | POA: Diagnosis not present

## 2017-01-22 DIAGNOSIS — I255 Ischemic cardiomyopathy: Secondary | ICD-10-CM | POA: Diagnosis not present

## 2017-01-22 DIAGNOSIS — D631 Anemia in chronic kidney disease: Secondary | ICD-10-CM | POA: Diagnosis not present

## 2017-01-22 DIAGNOSIS — G8311 Monoplegia of lower limb affecting right dominant side: Secondary | ICD-10-CM | POA: Diagnosis not present

## 2017-01-22 DIAGNOSIS — I5032 Chronic diastolic (congestive) heart failure: Secondary | ICD-10-CM | POA: Diagnosis not present

## 2017-01-22 DIAGNOSIS — C3490 Malignant neoplasm of unspecified part of unspecified bronchus or lung: Secondary | ICD-10-CM | POA: Diagnosis not present

## 2017-01-22 DIAGNOSIS — N183 Chronic kidney disease, stage 3 (moderate): Secondary | ICD-10-CM | POA: Diagnosis not present

## 2017-01-22 DIAGNOSIS — I69320 Aphasia following cerebral infarction: Secondary | ICD-10-CM | POA: Diagnosis not present

## 2017-01-22 DIAGNOSIS — C7951 Secondary malignant neoplasm of bone: Secondary | ICD-10-CM | POA: Diagnosis not present

## 2017-01-22 DIAGNOSIS — I130 Hypertensive heart and chronic kidney disease with heart failure and stage 1 through stage 4 chronic kidney disease, or unspecified chronic kidney disease: Secondary | ICD-10-CM | POA: Diagnosis not present

## 2017-01-22 DIAGNOSIS — E7800 Pure hypercholesterolemia, unspecified: Secondary | ICD-10-CM | POA: Diagnosis not present

## 2017-01-22 DIAGNOSIS — I2510 Atherosclerotic heart disease of native coronary artery without angina pectoris: Secondary | ICD-10-CM | POA: Diagnosis not present

## 2017-01-22 DIAGNOSIS — Z466 Encounter for fitting and adjustment of urinary device: Secondary | ICD-10-CM | POA: Diagnosis not present

## 2017-01-22 DIAGNOSIS — I4891 Unspecified atrial fibrillation: Secondary | ICD-10-CM | POA: Diagnosis not present

## 2017-01-23 DIAGNOSIS — E7800 Pure hypercholesterolemia, unspecified: Secondary | ICD-10-CM | POA: Diagnosis not present

## 2017-01-23 DIAGNOSIS — I130 Hypertensive heart and chronic kidney disease with heart failure and stage 1 through stage 4 chronic kidney disease, or unspecified chronic kidney disease: Secondary | ICD-10-CM | POA: Diagnosis not present

## 2017-01-23 DIAGNOSIS — C3490 Malignant neoplasm of unspecified part of unspecified bronchus or lung: Secondary | ICD-10-CM | POA: Diagnosis not present

## 2017-01-23 DIAGNOSIS — I69320 Aphasia following cerebral infarction: Secondary | ICD-10-CM | POA: Diagnosis not present

## 2017-01-23 DIAGNOSIS — I5032 Chronic diastolic (congestive) heart failure: Secondary | ICD-10-CM | POA: Diagnosis not present

## 2017-01-23 DIAGNOSIS — I4891 Unspecified atrial fibrillation: Secondary | ICD-10-CM | POA: Diagnosis not present

## 2017-01-23 DIAGNOSIS — I255 Ischemic cardiomyopathy: Secondary | ICD-10-CM | POA: Diagnosis not present

## 2017-01-23 DIAGNOSIS — D631 Anemia in chronic kidney disease: Secondary | ICD-10-CM | POA: Diagnosis not present

## 2017-01-23 DIAGNOSIS — Z466 Encounter for fitting and adjustment of urinary device: Secondary | ICD-10-CM | POA: Diagnosis not present

## 2017-01-23 DIAGNOSIS — N183 Chronic kidney disease, stage 3 (moderate): Secondary | ICD-10-CM | POA: Diagnosis not present

## 2017-01-23 DIAGNOSIS — G8311 Monoplegia of lower limb affecting right dominant side: Secondary | ICD-10-CM | POA: Diagnosis not present

## 2017-01-23 DIAGNOSIS — C7951 Secondary malignant neoplasm of bone: Secondary | ICD-10-CM | POA: Diagnosis not present

## 2017-01-23 DIAGNOSIS — I2510 Atherosclerotic heart disease of native coronary artery without angina pectoris: Secondary | ICD-10-CM | POA: Diagnosis not present

## 2017-01-24 DIAGNOSIS — G8311 Monoplegia of lower limb affecting right dominant side: Secondary | ICD-10-CM | POA: Diagnosis not present

## 2017-01-24 DIAGNOSIS — C7951 Secondary malignant neoplasm of bone: Secondary | ICD-10-CM | POA: Diagnosis not present

## 2017-01-24 DIAGNOSIS — D631 Anemia in chronic kidney disease: Secondary | ICD-10-CM | POA: Diagnosis not present

## 2017-01-24 DIAGNOSIS — I255 Ischemic cardiomyopathy: Secondary | ICD-10-CM | POA: Diagnosis not present

## 2017-01-24 DIAGNOSIS — E7800 Pure hypercholesterolemia, unspecified: Secondary | ICD-10-CM | POA: Diagnosis not present

## 2017-01-24 DIAGNOSIS — I69320 Aphasia following cerebral infarction: Secondary | ICD-10-CM | POA: Diagnosis not present

## 2017-01-24 DIAGNOSIS — I2510 Atherosclerotic heart disease of native coronary artery without angina pectoris: Secondary | ICD-10-CM | POA: Diagnosis not present

## 2017-01-24 DIAGNOSIS — I4891 Unspecified atrial fibrillation: Secondary | ICD-10-CM | POA: Diagnosis not present

## 2017-01-24 DIAGNOSIS — C3490 Malignant neoplasm of unspecified part of unspecified bronchus or lung: Secondary | ICD-10-CM | POA: Diagnosis not present

## 2017-01-24 DIAGNOSIS — I130 Hypertensive heart and chronic kidney disease with heart failure and stage 1 through stage 4 chronic kidney disease, or unspecified chronic kidney disease: Secondary | ICD-10-CM | POA: Diagnosis not present

## 2017-01-24 DIAGNOSIS — I5032 Chronic diastolic (congestive) heart failure: Secondary | ICD-10-CM | POA: Diagnosis not present

## 2017-01-24 DIAGNOSIS — N183 Chronic kidney disease, stage 3 (moderate): Secondary | ICD-10-CM | POA: Diagnosis not present

## 2017-01-24 DIAGNOSIS — Z466 Encounter for fitting and adjustment of urinary device: Secondary | ICD-10-CM | POA: Diagnosis not present

## 2017-01-28 DIAGNOSIS — I2510 Atherosclerotic heart disease of native coronary artery without angina pectoris: Secondary | ICD-10-CM | POA: Diagnosis not present

## 2017-01-28 DIAGNOSIS — N183 Chronic kidney disease, stage 3 (moderate): Secondary | ICD-10-CM | POA: Diagnosis not present

## 2017-01-28 DIAGNOSIS — G8311 Monoplegia of lower limb affecting right dominant side: Secondary | ICD-10-CM | POA: Diagnosis not present

## 2017-01-28 DIAGNOSIS — I69320 Aphasia following cerebral infarction: Secondary | ICD-10-CM | POA: Diagnosis not present

## 2017-01-28 DIAGNOSIS — C3490 Malignant neoplasm of unspecified part of unspecified bronchus or lung: Secondary | ICD-10-CM | POA: Diagnosis not present

## 2017-01-28 DIAGNOSIS — D631 Anemia in chronic kidney disease: Secondary | ICD-10-CM | POA: Diagnosis not present

## 2017-01-28 DIAGNOSIS — I255 Ischemic cardiomyopathy: Secondary | ICD-10-CM | POA: Diagnosis not present

## 2017-01-28 DIAGNOSIS — C7951 Secondary malignant neoplasm of bone: Secondary | ICD-10-CM | POA: Diagnosis not present

## 2017-01-28 DIAGNOSIS — I4891 Unspecified atrial fibrillation: Secondary | ICD-10-CM | POA: Diagnosis not present

## 2017-01-28 DIAGNOSIS — I130 Hypertensive heart and chronic kidney disease with heart failure and stage 1 through stage 4 chronic kidney disease, or unspecified chronic kidney disease: Secondary | ICD-10-CM | POA: Diagnosis not present

## 2017-01-28 DIAGNOSIS — I5032 Chronic diastolic (congestive) heart failure: Secondary | ICD-10-CM | POA: Diagnosis not present

## 2017-01-28 DIAGNOSIS — Z466 Encounter for fitting and adjustment of urinary device: Secondary | ICD-10-CM | POA: Diagnosis not present

## 2017-01-28 DIAGNOSIS — E7800 Pure hypercholesterolemia, unspecified: Secondary | ICD-10-CM | POA: Diagnosis not present

## 2017-01-29 DIAGNOSIS — E7800 Pure hypercholesterolemia, unspecified: Secondary | ICD-10-CM | POA: Diagnosis not present

## 2017-01-29 DIAGNOSIS — C3490 Malignant neoplasm of unspecified part of unspecified bronchus or lung: Secondary | ICD-10-CM | POA: Diagnosis not present

## 2017-01-29 DIAGNOSIS — I130 Hypertensive heart and chronic kidney disease with heart failure and stage 1 through stage 4 chronic kidney disease, or unspecified chronic kidney disease: Secondary | ICD-10-CM | POA: Diagnosis not present

## 2017-01-29 DIAGNOSIS — I255 Ischemic cardiomyopathy: Secondary | ICD-10-CM | POA: Diagnosis not present

## 2017-01-29 DIAGNOSIS — D631 Anemia in chronic kidney disease: Secondary | ICD-10-CM | POA: Diagnosis not present

## 2017-01-29 DIAGNOSIS — C7951 Secondary malignant neoplasm of bone: Secondary | ICD-10-CM | POA: Diagnosis not present

## 2017-01-29 DIAGNOSIS — N183 Chronic kidney disease, stage 3 (moderate): Secondary | ICD-10-CM | POA: Diagnosis not present

## 2017-01-29 DIAGNOSIS — I5032 Chronic diastolic (congestive) heart failure: Secondary | ICD-10-CM | POA: Diagnosis not present

## 2017-01-29 DIAGNOSIS — I69320 Aphasia following cerebral infarction: Secondary | ICD-10-CM | POA: Diagnosis not present

## 2017-01-29 DIAGNOSIS — I2510 Atherosclerotic heart disease of native coronary artery without angina pectoris: Secondary | ICD-10-CM | POA: Diagnosis not present

## 2017-01-29 DIAGNOSIS — I4891 Unspecified atrial fibrillation: Secondary | ICD-10-CM | POA: Diagnosis not present

## 2017-01-29 DIAGNOSIS — Z466 Encounter for fitting and adjustment of urinary device: Secondary | ICD-10-CM | POA: Diagnosis not present

## 2017-01-29 DIAGNOSIS — G8311 Monoplegia of lower limb affecting right dominant side: Secondary | ICD-10-CM | POA: Diagnosis not present

## 2017-01-30 DIAGNOSIS — N183 Chronic kidney disease, stage 3 (moderate): Secondary | ICD-10-CM | POA: Diagnosis not present

## 2017-01-30 DIAGNOSIS — C3490 Malignant neoplasm of unspecified part of unspecified bronchus or lung: Secondary | ICD-10-CM | POA: Diagnosis not present

## 2017-01-30 DIAGNOSIS — C7951 Secondary malignant neoplasm of bone: Secondary | ICD-10-CM | POA: Diagnosis not present

## 2017-01-30 DIAGNOSIS — I4891 Unspecified atrial fibrillation: Secondary | ICD-10-CM | POA: Diagnosis not present

## 2017-01-30 DIAGNOSIS — I255 Ischemic cardiomyopathy: Secondary | ICD-10-CM | POA: Diagnosis not present

## 2017-01-30 DIAGNOSIS — I69320 Aphasia following cerebral infarction: Secondary | ICD-10-CM | POA: Diagnosis not present

## 2017-01-30 DIAGNOSIS — E7800 Pure hypercholesterolemia, unspecified: Secondary | ICD-10-CM | POA: Diagnosis not present

## 2017-01-30 DIAGNOSIS — G8311 Monoplegia of lower limb affecting right dominant side: Secondary | ICD-10-CM | POA: Diagnosis not present

## 2017-01-30 DIAGNOSIS — I2510 Atherosclerotic heart disease of native coronary artery without angina pectoris: Secondary | ICD-10-CM | POA: Diagnosis not present

## 2017-01-30 DIAGNOSIS — D631 Anemia in chronic kidney disease: Secondary | ICD-10-CM | POA: Diagnosis not present

## 2017-01-30 DIAGNOSIS — I130 Hypertensive heart and chronic kidney disease with heart failure and stage 1 through stage 4 chronic kidney disease, or unspecified chronic kidney disease: Secondary | ICD-10-CM | POA: Diagnosis not present

## 2017-01-30 DIAGNOSIS — Z466 Encounter for fitting and adjustment of urinary device: Secondary | ICD-10-CM | POA: Diagnosis not present

## 2017-01-30 DIAGNOSIS — I5032 Chronic diastolic (congestive) heart failure: Secondary | ICD-10-CM | POA: Diagnosis not present

## 2017-01-31 DIAGNOSIS — I4891 Unspecified atrial fibrillation: Secondary | ICD-10-CM | POA: Diagnosis not present

## 2017-01-31 DIAGNOSIS — I130 Hypertensive heart and chronic kidney disease with heart failure and stage 1 through stage 4 chronic kidney disease, or unspecified chronic kidney disease: Secondary | ICD-10-CM | POA: Diagnosis not present

## 2017-01-31 DIAGNOSIS — Z466 Encounter for fitting and adjustment of urinary device: Secondary | ICD-10-CM | POA: Diagnosis not present

## 2017-01-31 DIAGNOSIS — I255 Ischemic cardiomyopathy: Secondary | ICD-10-CM | POA: Diagnosis not present

## 2017-01-31 DIAGNOSIS — C3490 Malignant neoplasm of unspecified part of unspecified bronchus or lung: Secondary | ICD-10-CM | POA: Diagnosis not present

## 2017-01-31 DIAGNOSIS — E7800 Pure hypercholesterolemia, unspecified: Secondary | ICD-10-CM | POA: Diagnosis not present

## 2017-01-31 DIAGNOSIS — G8311 Monoplegia of lower limb affecting right dominant side: Secondary | ICD-10-CM | POA: Diagnosis not present

## 2017-01-31 DIAGNOSIS — I5032 Chronic diastolic (congestive) heart failure: Secondary | ICD-10-CM | POA: Diagnosis not present

## 2017-01-31 DIAGNOSIS — I2510 Atherosclerotic heart disease of native coronary artery without angina pectoris: Secondary | ICD-10-CM | POA: Diagnosis not present

## 2017-01-31 DIAGNOSIS — C7951 Secondary malignant neoplasm of bone: Secondary | ICD-10-CM | POA: Diagnosis not present

## 2017-01-31 DIAGNOSIS — I69320 Aphasia following cerebral infarction: Secondary | ICD-10-CM | POA: Diagnosis not present

## 2017-01-31 DIAGNOSIS — D631 Anemia in chronic kidney disease: Secondary | ICD-10-CM | POA: Diagnosis not present

## 2017-01-31 DIAGNOSIS — N183 Chronic kidney disease, stage 3 (moderate): Secondary | ICD-10-CM | POA: Diagnosis not present

## 2017-02-04 DIAGNOSIS — C7951 Secondary malignant neoplasm of bone: Secondary | ICD-10-CM | POA: Diagnosis not present

## 2017-02-04 DIAGNOSIS — G8311 Monoplegia of lower limb affecting right dominant side: Secondary | ICD-10-CM | POA: Diagnosis not present

## 2017-02-04 DIAGNOSIS — Z466 Encounter for fitting and adjustment of urinary device: Secondary | ICD-10-CM | POA: Diagnosis not present

## 2017-02-04 DIAGNOSIS — D631 Anemia in chronic kidney disease: Secondary | ICD-10-CM | POA: Diagnosis not present

## 2017-02-04 DIAGNOSIS — I130 Hypertensive heart and chronic kidney disease with heart failure and stage 1 through stage 4 chronic kidney disease, or unspecified chronic kidney disease: Secondary | ICD-10-CM | POA: Diagnosis not present

## 2017-02-04 DIAGNOSIS — I4891 Unspecified atrial fibrillation: Secondary | ICD-10-CM | POA: Diagnosis not present

## 2017-02-04 DIAGNOSIS — I69320 Aphasia following cerebral infarction: Secondary | ICD-10-CM | POA: Diagnosis not present

## 2017-02-04 DIAGNOSIS — I2510 Atherosclerotic heart disease of native coronary artery without angina pectoris: Secondary | ICD-10-CM | POA: Diagnosis not present

## 2017-02-04 DIAGNOSIS — I255 Ischemic cardiomyopathy: Secondary | ICD-10-CM | POA: Diagnosis not present

## 2017-02-04 DIAGNOSIS — I5032 Chronic diastolic (congestive) heart failure: Secondary | ICD-10-CM | POA: Diagnosis not present

## 2017-02-04 DIAGNOSIS — N183 Chronic kidney disease, stage 3 (moderate): Secondary | ICD-10-CM | POA: Diagnosis not present

## 2017-02-04 DIAGNOSIS — E7800 Pure hypercholesterolemia, unspecified: Secondary | ICD-10-CM | POA: Diagnosis not present

## 2017-02-04 DIAGNOSIS — C3490 Malignant neoplasm of unspecified part of unspecified bronchus or lung: Secondary | ICD-10-CM | POA: Diagnosis not present

## 2017-02-07 ENCOUNTER — Ambulatory Visit (HOSPITAL_COMMUNITY)
Admission: RE | Admit: 2017-02-07 | Discharge: 2017-02-07 | Disposition: A | Discharge status: Home / Self Care | Payer: Medicare Other | Source: Ambulatory Visit | Attending: Internal Medicine | Admitting: Internal Medicine

## 2017-02-07 DIAGNOSIS — R918 Other nonspecific abnormal finding of lung field: Secondary | ICD-10-CM | POA: Diagnosis not present

## 2017-02-07 DIAGNOSIS — R5382 Chronic fatigue, unspecified: Secondary | ICD-10-CM

## 2017-02-07 DIAGNOSIS — N183 Chronic kidney disease, stage 3 unspecified: Secondary | ICD-10-CM

## 2017-02-07 DIAGNOSIS — C7951 Secondary malignant neoplasm of bone: Secondary | ICD-10-CM | POA: Diagnosis not present

## 2017-02-07 DIAGNOSIS — J90 Pleural effusion, not elsewhere classified: Secondary | ICD-10-CM | POA: Insufficient documentation

## 2017-02-07 DIAGNOSIS — I700 Atherosclerosis of aorta: Secondary | ICD-10-CM | POA: Diagnosis not present

## 2017-02-07 DIAGNOSIS — M488X7 Other specified spondylopathies, lumbosacral region: Secondary | ICD-10-CM | POA: Diagnosis not present

## 2017-02-07 DIAGNOSIS — D638 Anemia in other chronic diseases classified elsewhere: Secondary | ICD-10-CM | POA: Diagnosis not present

## 2017-02-07 DIAGNOSIS — J9811 Atelectasis: Secondary | ICD-10-CM | POA: Insufficient documentation

## 2017-02-07 DIAGNOSIS — I2510 Atherosclerotic heart disease of native coronary artery without angina pectoris: Secondary | ICD-10-CM | POA: Insufficient documentation

## 2017-02-07 DIAGNOSIS — Z5112 Encounter for antineoplastic immunotherapy: Secondary | ICD-10-CM | POA: Diagnosis not present

## 2017-02-07 DIAGNOSIS — R911 Solitary pulmonary nodule: Secondary | ICD-10-CM | POA: Diagnosis not present

## 2017-02-07 DIAGNOSIS — C3490 Malignant neoplasm of unspecified part of unspecified bronchus or lung: Secondary | ICD-10-CM | POA: Diagnosis not present

## 2017-02-10 ENCOUNTER — Telehealth: Payer: Self-pay | Admitting: Internal Medicine

## 2017-02-10 ENCOUNTER — Encounter: Payer: Medicare Other | Admitting: Nutrition

## 2017-02-10 ENCOUNTER — Encounter (INDEPENDENT_AMBULATORY_CARE_PROVIDER_SITE_OTHER): Payer: Self-pay

## 2017-02-10 ENCOUNTER — Other Ambulatory Visit (HOSPITAL_BASED_OUTPATIENT_CLINIC_OR_DEPARTMENT_OTHER): Payer: Medicare Other

## 2017-02-10 ENCOUNTER — Ambulatory Visit: Payer: Medicare Other

## 2017-02-10 ENCOUNTER — Ambulatory Visit (HOSPITAL_BASED_OUTPATIENT_CLINIC_OR_DEPARTMENT_OTHER): Payer: Medicare Other | Admitting: Internal Medicine

## 2017-02-10 ENCOUNTER — Encounter: Payer: Self-pay | Admitting: Internal Medicine

## 2017-02-10 VITALS — BP 157/70 | HR 70 | Temp 97.9°F | Resp 18 | Ht 68.0 in | Wt 125.7 lb

## 2017-02-10 DIAGNOSIS — R5382 Chronic fatigue, unspecified: Secondary | ICD-10-CM | POA: Diagnosis not present

## 2017-02-10 DIAGNOSIS — C7951 Secondary malignant neoplasm of bone: Secondary | ICD-10-CM

## 2017-02-10 DIAGNOSIS — D638 Anemia in other chronic diseases classified elsewhere: Secondary | ICD-10-CM

## 2017-02-10 DIAGNOSIS — N183 Chronic kidney disease, stage 3 unspecified: Secondary | ICD-10-CM

## 2017-02-10 DIAGNOSIS — Z95828 Presence of other vascular implants and grafts: Secondary | ICD-10-CM

## 2017-02-10 DIAGNOSIS — Z85118 Personal history of other malignant neoplasm of bronchus and lung: Secondary | ICD-10-CM

## 2017-02-10 DIAGNOSIS — Z5112 Encounter for antineoplastic immunotherapy: Secondary | ICD-10-CM

## 2017-02-10 LAB — COMPREHENSIVE METABOLIC PANEL
ALK PHOS: 120 U/L (ref 40–150)
ALT: 9 U/L (ref 0–55)
ANION GAP: 12 meq/L — AB (ref 3–11)
AST: 13 U/L (ref 5–34)
Albumin: 3.1 g/dL — ABNORMAL LOW (ref 3.5–5.0)
BILIRUBIN TOTAL: 0.36 mg/dL (ref 0.20–1.20)
BUN: 69.7 mg/dL — ABNORMAL HIGH (ref 7.0–26.0)
CALCIUM: 8.7 mg/dL (ref 8.4–10.4)
CHLORIDE: 106 meq/L (ref 98–109)
CO2: 20 mEq/L — ABNORMAL LOW (ref 22–29)
CREATININE: 3.7 mg/dL — AB (ref 0.6–1.1)
EGFR: 11 mL/min/{1.73_m2} — ABNORMAL LOW (ref >90)
Glucose: 99 mg/dl (ref 70–140)
Potassium: 5 mEq/L (ref 3.5–5.1)
Sodium: 138 mEq/L (ref 136–145)
Total Protein: 6.9 g/dL (ref 6.4–8.3)

## 2017-02-10 LAB — CBC WITH DIFFERENTIAL/PLATELET
BASO%: 0.9 % (ref 0.0–2.0)
BASOS ABS: 0 10*3/uL (ref 0.0–0.1)
EOS%: 5.5 % (ref 0.0–7.0)
Eosinophils Absolute: 0.2 10*3/uL (ref 0.0–0.5)
HEMATOCRIT: 31.9 % — AB (ref 34.8–46.6)
HGB: 10.3 g/dL — ABNORMAL LOW (ref 11.6–15.9)
LYMPH#: 0.4 10*3/uL — AB (ref 0.9–3.3)
LYMPH%: 8.7 % — ABNORMAL LOW (ref 14.0–49.7)
MCH: 28.3 pg (ref 25.1–34.0)
MCHC: 32.2 g/dL (ref 31.5–36.0)
MCV: 87.9 fL (ref 79.5–101.0)
MONO#: 0.5 10*3/uL (ref 0.1–0.9)
MONO%: 12.5 % (ref 0.0–14.0)
NEUT#: 3.2 10*3/uL (ref 1.5–6.5)
NEUT%: 72.4 % (ref 38.4–76.8)
PLATELETS: 164 10*3/uL (ref 145–400)
RBC: 3.63 10*6/uL — ABNORMAL LOW (ref 3.70–5.45)
RDW: 14.3 % (ref 11.2–14.5)
WBC: 4.4 10*3/uL (ref 3.9–10.3)

## 2017-02-10 LAB — TSH: TSH: 6.991 m[IU]/L — AB (ref 0.308–3.960)

## 2017-02-10 MED ORDER — SODIUM CHLORIDE 0.9% FLUSH
10.0000 mL | INTRAVENOUS | Status: DC | PRN
  Administered 2017-02-10: 10 mL via INTRAVENOUS
  Filled 2017-02-10: qty 10

## 2017-02-10 MED ORDER — HEPARIN SOD (PORK) LOCK FLUSH 100 UNIT/ML IV SOLN
500.0000 [IU] | Freq: Once | INTRAVENOUS | Status: AC | PRN
  Administered 2017-02-10: 500 [IU] via INTRAVENOUS
  Filled 2017-02-10: qty 5

## 2017-02-10 NOTE — Progress Notes (Signed)
[place] [phone]   [phone]  OFFICE PROGRESS NOTE  Celene Squibb, MD [place] [address]  PRINCIPAL DIAGNOSIS: Stage IV non-small cell lung cancer diagnosed in March 2007, with disease recurrence in June 2016 with positive PDL 1 expression (50%).   PRIOR THERAPY:  1) Status post 6 cycles of systemic chemotherapy with carboplatin and docetaxel. Last dose was given January 16, 2006.  2) Palliative radiotherapy to the large right paraspinous mass with osseous invasion centered at L5 under the care of Dr. Valere Dross. 3) Systemic chemotherapy with carboplatin for AUC of 5 and paclitaxel 175 MG/M2 every 3 weeks with Neulasta support. First dose expected on 02/02/2015. Status post 2 cycles, last dose was given 02/23/2015 discontinued secondary to intolerance and mild disease progression. 4) Ketruda 200 mg IV every 3 weeks. First dose 04/14/2015. Status post 3 cycles. Treatment was discontinued after the patient had several hospitalization with recurrent infection and back abscess but were unrelated to her treatment.  CURRENT THERAPY: Retreatment with Ketruda 200 mg IV every 3 weeks status post 6 cycles.  INTERVAL HISTORY: [name] 83 y.o. female returns to the clinic today for follow-up visit accompanied by her daughter. The patient is feeling fine today with no specific complaints. She had few episodes of hematuria recently and her treatment wit Eliquis was placed on hold. She is feeling much better. She denied having any current chest pain, shortness of breath, cough or hemoptysis. She denied having any fever or chills. She has no nausea, vomiting, diarrhea or constipation. The patient has no recent weight loss or night sweats. She tolerated the last cycle of her treatment with [place] fairly well. She had repeat CT scan of the chest, abdomen and pelvis performed recently and she is here for evaluation and discussion of her scan  results.  MEDICAL HISTORY: Past Medical History:  Diagnosis Date  . A-fib ([place])   . Chronic anemia   . Chronic diastolic (congestive) heart failure ([place])   . Chronic fatigue 04/04/2015  . Chronic fatigue 04/04/2015  . CKD (chronic kidney disease)   . Coronary artery disease    a.  LHC (06/04/05): LHC done in [place] with high grade RCA => s/p BMS to RCA;  b.  Nuclear (09/14/09): Lexiscan; Inf infarct with mild peri-infarct ishemia, EF 52%; Low Risk.  Marland Kitchen DVT (deep venous thrombosis) ([place])   . Foot drop, right   . GERD (gastroesophageal reflux disease)   . Goals of care, counseling/discussion 09/30/2016  . Hypercholesterolemia   . Hypertension   . Ischemic cardiomyopathy    a. Echo (07/26/13): Mild LVH, EF 35-40%, diff HK, inf AK, Gr 2 DD, Tr AI, mildly dilated Ao root, MAC, mild MR, mild LAE, mod reduced RVSF.  . Non-small cell carcinoma of lung ([place])    Stage IV;spinal cancer; had chemo and radiation and immune therapy  . Stroke [facility])     ALLERGIES:  is allergic to amlodipine; ciprofloxacin; mirtazapine; statins; aspirin; cephalexin; hydralazine; iron; ambien [zolpidem tartrate]; eliquis [apixaban]; lorazepam; sulfonamide derivatives; xarelto [rivaroxaban]; zolpidem; latex; penicillins; shellfish allergy; and tape.  MEDICATIONS:  Current Outpatient Prescriptions  Medication Sig Dispense Refill  . acetaminophen (TYLENOL) 500 MG tablet Take 500 mg by mouth daily as needed for mild pain.    . diazepam (VALIUM) 2 MG tablet Take 1 tablet (2 mg total) by mouth See admin instructions. Take 1 tab 1 hour prior to MRI. May repeat X1 after 1  hour if not effective. 2 tablet 0  . diphenoxylate-atropine (LOMOTIL) 2.5-0.025 MG tablet Take 1 tablet by mouth 4 (four) times daily as needed for diarrhea or loose stools. Reported on 11/28/2015    . ELIQUIS 2.5 MG TABS tablet TAKE (1) TABLET BY MOUTH TWICE DAILY. 60 tablet 0  . furosemide (LASIX) 40 MG tablet Take 40 mg by mouth daily as needed for  fluid or edema.     Marland Kitchen loperamide (IMODIUM) 2 MG capsule Take 1 capsule (2 mg total) by mouth every 6 (six) hours as needed for diarrhea or loose stools. 30 capsule 0  . metoprolol tartrate (LOPRESSOR) 25 MG tablet Take 0.5 tablets (12.5 mg total) by mouth at bedtime. 30 tablet 0  . pantoprazole (PROTONIX) 40 MG tablet Take 40 mg by mouth daily before breakfast.     . Pembrolizumab (KEYTRUDA IV) Inject into the vein every 21 ( twenty-one) days.    Marland Kitchen senna-docusate (SENOKOT-S) 8.6-50 MG tablet Take 1 tablet by mouth at bedtime as needed for mild constipation. 30 tablet 0   No current facility-administered medications for this visit.    Facility-Administered Medications Ordered in Other Visits  Medication Dose Route Frequency Provider Last Rate Last Dose  . heparin lock flush 100 unit/mL  500 Units Intracatheter Once PRN Curt Bears, MD      . sodium chloride flush (NS) 0.9 % injection 10 mL  10 mL Intracatheter PRN Curt Bears, MD      . sodium chloride flush (NS) 0.9 % injection 10 mL  10 mL Intravenous PRN Curt Bears, MD   10 mL at 01/20/17 1228  . sodium chloride flush (NS) 0.9 % injection 10 mL  10 mL Intravenous PRN Curt Bears, MD   10 mL at 02/10/17 0816    SURGICAL HISTORY:  Past Surgical History:  Procedure Laterality Date  . ABDOMINAL HYSTERECTOMY    . APPENDECTOMY    . CATARACT EXTRACTION Bilateral   . CHOLECYSTECTOMY  2011  . CYSTOSCOPY     with stent placement  . CYSTOSCOPY W/ URETERAL STENT PLACEMENT Right 12/12/2015   Procedure: CYSTOSCOPY WITH RETROGRADE PYELOGRAM WITH STENT EXCHANGE;  Surgeon: Franchot Gallo, MD;  Location: [facility];  Service: Urology;  Laterality: Right;  . ERCP  2011   with stone extraction, done same day as cholecystectomy  . HEMATOMA EVACUATION  December 2006   groin  . HEMORROIDECTOMY    . LAMINECTOMY    . PORTACATH PLACEMENT    . TONSILLECTOMY      REVIEW OF SYSTEMS:  Constitutional: negative Eyes: negative Ears, nose,  mouth, throat, and face: negative Respiratory: negative Cardiovascular: negative Gastrointestinal: negative Genitourinary:negative Integument/breast: negative Hematologic/lymphatic: negative Musculoskeletal:positive for muscle weakness Neurological: negative Behavioral/Psych: negative Endocrine: negative Allergic/Immunologic: negative   PHYSICAL EXAMINATION: General appearance: alert, cooperative and no distress Head: Normocephalic, without obvious abnormality, atraumatic Neck: no adenopathy Lymph nodes: Cervical, supraclavicular, and axillary nodes normal. Resp: clear to auscultation bilaterally Back: symmetric, no curvature. ROM normal. No CVA tenderness. Cardio: regular rate and rhythm, S1, S2 normal, no murmur, click, rub or gallop GI: soft, non-tender; bowel sounds normal; no masses,  no organomegaly Extremities: extremities normal, atraumatic, no cyanosis or edema Neurologic: Alert and oriented X 3, normal strength and tone. Normal symmetric reflexes. Normal coordination and gait  ECOG PERFORMANCE STATUS: 2 - Symptomatic, <50% confined to bed  Blood pressure (!) 157/70, pulse 70, temperature 97.9 F (36.6 C), temperature source Oral, resp. rate 18, height _0  (1.727 m), weight 125 lb  11.2 oz (57 kg), SpO2 94 %.  LABORATORY DATA: Lab Results  Component Value Date   WBC 4.4 02/10/2017   HGB 10.3 (L) 02/10/2017   HCT 31.9 (L) 02/10/2017   MCV 87.9 02/10/2017   PLT 164 02/10/2017      Chemistry      Component Value Date/Time   NA 137 01/20/2017 0842   K 5.1 01/20/2017 0842   CL 113 (H) 11/03/2016 0426   CL 104 06/19/2012 0958   CO2 24 01/20/2017 0842   BUN 55.7 (H) 01/20/2017 0842   CREATININE 3.5 (HH) 01/20/2017 0842      Component Value Date/Time   CALCIUM 8.9 01/20/2017 0842   ALKPHOS 130 01/20/2017 0842   AST 16 01/20/2017 0842   ALT 9 01/20/2017 0842   BILITOT 0.33 01/20/2017 0842       RADIOGRAPHIC STUDIES: Ct Abdomen Pelvis Wo Contrast  Result  Date: 02/07/2017 CLINICAL DATA:  Non-small cell lung cancer with bone metastasis EXAM: CT CHEST, ABDOMEN AND PELVIS WITHOUT CONTRAST TECHNIQUE: Multidetector CT imaging of the chest, abdomen and pelvis was performed following the standard protocol without IV contrast. COMPARISON:  CT 12/06/2016 FINDINGS: CT CHEST FINDINGS Cardiovascular: Coronary artery calcification and aortic atherosclerotic calcification. 4.1 cm ascending aortic dilation. No change from prior Mediastinum/Nodes: No axillary supraclavicular adenopathy port in the RIGHT chest wall. The distal SVC. No mediastinal lymphadenopathy. No pericardial fluid. Esophagus normal Lungs/Pleura: LEFT upper lobe nodule measuring 8 mm by 5 mm compares to 9 mm x 7 mm. Linear thickening along the inferior LEFT oblique fissure slightly increased. New moderate LEFT effusion which is unchanged. In the RIGHT lung, lower lobe nodule measures 6 mm (image 80, series 6) compared with 6 mm on prior. Large RIGHT pleural effusion with atelectasis of the RIGHT lower lobe. There is calcification within the atelectatic RIGHT lower lobe not changed. Musculoskeletal: No aggressive osseous lesion. CT ABDOMEN AND PELVIS FINDINGS Hepatobiliary: No focal hepatic lesion. Postcholecystectomy. No biliary dilatation. Pancreas: Pancreas is normal. No ductal dilatation. No pancreatic inflammation. Spleen: Normal spleen Adrenals/urinary tract: Adrenal glands are normal. A stent in the RIGHT kidney seen in the bladder. No hydronephrosis. Exophytic lesion from the mid kidney measured 1.5 cm and cannot be fully characterize. LEFT kidney is normal. There is cortical thinning in lower pole LEFT kidney. Foley catheter collapsed bladder Stomach/Bowel: Stomach, small-bowel and cecum normal. Post appendectomy. Colon and rectosigmoid colon normal. Vascular/Lymphatic: Abdominal aorta is normal caliber with atherosclerotic calcification. There is no retroperitoneal or periportal lymphadenopathy. No pelvic  lymphadenopathy. Reproductive: Abdominal aorta is normal caliber with atherosclerotic calcification. There is no retroperitoneal or periportal lymphadenopathy. No pelvic lymphadenopathy. Other: No free fluid. Musculoskeletal: Lytic lesion in the RIGHT aspect sacrum not changed measuring 5 cm (image 93, series 2. Compression deformity of the L5 vertebral body with lytic and sclerotic change. IMPRESSION: Chest Impression: 1. Stable bilateral pulmonary nodules. 2. Stable bilateral pleural effusions. 3. Stable RIGHT basilar atelectasis with calcified pulmonary parenchyma. 4. Coronary artery calcification and aortic atherosclerotic calcification. Abdomen / Pelvis Impression: 1. No evidence of metastatic disease in soft tissues the abdomen pelvis. 2. Lytic lesion in the LEFT sacrum and L5 vertebral bodies similar. 3. RIGHT ureteral stent without obstruction. 4.  Aortic Atherosclerosis (ICD10-I70.0). Electronically Signed   By: Suzy Bouchard M.D.   On: 02/07/2017 15:13   Ct Chest Wo Contrast  Result Date: 02/07/2017 CLINICAL DATA:  Non-small cell lung cancer with bone metastasis EXAM: CT CHEST, ABDOMEN AND PELVIS WITHOUT CONTRAST TECHNIQUE: Multidetector CT imaging  of the chest, abdomen and pelvis was performed following the standard protocol without IV contrast. COMPARISON:  CT 12/06/2016 FINDINGS: CT CHEST FINDINGS Cardiovascular: Coronary artery calcification and aortic atherosclerotic calcification. 4.1 cm ascending aortic dilation. No change from prior Mediastinum/Nodes: No axillary supraclavicular adenopathy port in the RIGHT chest wall. The distal SVC. No mediastinal lymphadenopathy. No pericardial fluid. Esophagus normal Lungs/Pleura: LEFT upper lobe nodule measuring 8 mm by 5 mm compares to 9 mm x 7 mm. Linear thickening along the inferior LEFT oblique fissure slightly increased. New moderate LEFT effusion which is unchanged. In the RIGHT lung, lower lobe nodule measures 6 mm (image 80, series 6) compared  with 6 mm on prior. Large RIGHT pleural effusion with atelectasis of the RIGHT lower lobe. There is calcification within the atelectatic RIGHT lower lobe not changed. Musculoskeletal: No aggressive osseous lesion. CT ABDOMEN AND PELVIS FINDINGS Hepatobiliary: No focal hepatic lesion. Postcholecystectomy. No biliary dilatation. Pancreas: Pancreas is normal. No ductal dilatation. No pancreatic inflammation. Spleen: Normal spleen Adrenals/urinary tract: Adrenal glands are normal. A stent in the RIGHT kidney seen in the bladder. No hydronephrosis. Exophytic lesion from the mid kidney measured 1.5 cm and cannot be fully characterize. LEFT kidney is normal. There is cortical thinning in lower pole LEFT kidney. Foley catheter collapsed bladder Stomach/Bowel: Stomach, small-bowel and cecum normal. Post appendectomy. Colon and rectosigmoid colon normal. Vascular/Lymphatic: Abdominal aorta is normal caliber with atherosclerotic calcification. There is no retroperitoneal or periportal lymphadenopathy. No pelvic lymphadenopathy. Reproductive: Abdominal aorta is normal caliber with atherosclerotic calcification. There is no retroperitoneal or periportal lymphadenopathy. No pelvic lymphadenopathy. Other: No free fluid. Musculoskeletal: Lytic lesion in the RIGHT aspect sacrum not changed measuring 5 cm (image 93, series 2. Compression deformity of the L5 vertebral body with lytic and sclerotic change. IMPRESSION: Chest Impression: 1. Stable bilateral pulmonary nodules. 2. Stable bilateral pleural effusions. 3. Stable RIGHT basilar atelectasis with calcified pulmonary parenchyma. 4. Coronary artery calcification and aortic atherosclerotic calcification. Abdomen / Pelvis Impression: 1. No evidence of metastatic disease in soft tissues the abdomen pelvis. 2. Lytic lesion in the LEFT sacrum and L5 vertebral bodies similar. 3. RIGHT ureteral stent without obstruction. 4.  Aortic Atherosclerosis (ICD10-I70.0). Electronically Signed    By: Suzy Bouchard M.D.   On: 02/07/2017 15:13    ASSESSMENT AND PLAN:  This is a very pleasant 83 years old white female with a stage IV non-small cell lung cancer initially diagnosed in March 2007 status post systemic chemotherapy with carboplatin and docetaxel for 6 cycles and was on observation for 9 years. She developed disease progression with metastatic bone disease in the lower thoracic and lumbar spines. The patient was treated with palliative radiotherapy followed by 2 cycles of systemic chemotherapy with carboplatin and paclitaxel discontinued secondary to intolerance. She is currently on treatment with immunotherapy with [place] status post 6 cycles and she is currently tolerating this treatment fairly well with no significant adverse effects. She had repeat CT scan of the chest, abdomen and pelvis performed recently. I personally and independently reviewed the scan images and discuss the results with the patient and her daughter. Her scan showed stable disease.  I gave the patient the option of continuing her current treatment with [place] versus taking a break because of the logistic issues and distress on her daughter. The patient would like to take a break from treatment for now. I would see her back for follow-up visit in 3 months with repeat CT scan of the chest, abdomen and pelvis for restaging  of her disease.  For hypertension she will continue with her current blood pressure medication.  She was advised to call immediately if she has any concerning symptoms in the interval.  All questions were answered. The patient knows to call the clinic with any problems, questions or concerns. We can certainly see the patient much sooner if necessary.  Disclaimer: This note was dictated with voice recognition software. Similar sounding words can inadvertently be transcribed and may not be corrected upon review.

## 2017-02-10 NOTE — Patient Instructions (Signed)
Implanted Port Home Guide An implanted port is a type of central line that is placed under the skin. Central lines are used to provide IV access when treatment or nutrition needs to be given through a person's veins. Implanted ports are used for long-term IV access. An implanted port may be placed because:  You need IV medicine that would be irritating to the small veins in your hands or arms.  You need long-term IV medicines, such as antibiotics.  You need IV nutrition for a long period.  You need frequent blood draws for lab tests.  You need dialysis.  Implanted ports are usually placed in the chest area, but they can also be placed in the upper arm, the abdomen, or the leg. An implanted port has two main parts:  Reservoir. The reservoir is round and will appear as a small, raised area under your skin. The reservoir is the part where a needle is inserted to give medicines or draw blood.  Catheter. The catheter is a thin, flexible tube that extends from the reservoir. The catheter is placed into a large vein. Medicine that is inserted into the reservoir goes into the catheter and then into the vein.  How will I care for my incision site? Do not get the incision site wet. Bathe or shower as directed by your health care provider. How is my port accessed? Special steps must be taken to access the port:  Before the port is accessed, a numbing cream can be placed on the skin. This helps numb the skin over the port site.  Your health care provider uses a sterile technique to access the port. ? Your health care provider must put on a mask and sterile gloves. ? The skin over your port is cleaned carefully with an antiseptic and allowed to dry. ? The port is gently pinched between sterile gloves, and a needle is inserted into the port.  Only "non-coring" port needles should be used to access the port. Once the port is accessed, a blood return should be checked. This helps ensure that the port  is in the vein and is not clogged.  If your port needs to remain accessed for a constant infusion, a clear (transparent) bandage will be placed over the needle site. The bandage and needle will need to be changed every week, or as directed by your health care provider.  Keep the bandage covering the needle clean and dry. Do not get it wet. Follow your health care provider's instructions on how to take a shower or bath while the port is accessed.  If your port does not need to stay accessed, no bandage is needed over the port.  What is flushing? Flushing helps keep the port from getting clogged. Follow your health care provider's instructions on how and when to flush the port. Ports are usually flushed with saline solution or a medicine called heparin. The need for flushing will depend on how the port is used.  If the port is used for intermittent medicines or blood draws, the port will need to be flushed: ? After medicines have been given. ? After blood has been drawn. ? As part of routine maintenance.  If a constant infusion is running, the port may not need to be flushed.  How long will my port stay implanted? The port can stay in for as long as your health care provider thinks it is needed. When it is time for the port to come out, surgery will be   done to remove it. The procedure is similar to the one performed when the port was put in. When should I seek immediate medical care? When you have an implanted port, you should seek immediate medical care if:  You notice a bad smell coming from the incision site.  You have swelling, redness, or drainage at the incision site.  You have more swelling or pain at the port site or the surrounding area.  You have a fever that is not controlled with medicine.  This information is not intended to replace advice given to you by your health care provider. Make sure you discuss any questions you have with your health care provider. Document  Released: 05/20/2005 Document Revised: 10/26/2015 Document Reviewed: 01/25/2013 Elsevier Interactive Patient Education  2017 [employer]  

## 2017-02-10 NOTE — Telephone Encounter (Signed)
Gave patients daughter AVS and calendar of upcoming December appointments.

## 2017-02-11 DIAGNOSIS — C3490 Malignant neoplasm of unspecified part of unspecified bronchus or lung: Secondary | ICD-10-CM | POA: Diagnosis not present

## 2017-02-11 DIAGNOSIS — N183 Chronic kidney disease, stage 3 (moderate): Secondary | ICD-10-CM | POA: Diagnosis not present

## 2017-02-11 DIAGNOSIS — Z466 Encounter for fitting and adjustment of urinary device: Secondary | ICD-10-CM | POA: Diagnosis not present

## 2017-02-11 DIAGNOSIS — I255 Ischemic cardiomyopathy: Secondary | ICD-10-CM | POA: Diagnosis not present

## 2017-02-11 DIAGNOSIS — C7951 Secondary malignant neoplasm of bone: Secondary | ICD-10-CM | POA: Diagnosis not present

## 2017-02-11 DIAGNOSIS — I5032 Chronic diastolic (congestive) heart failure: Secondary | ICD-10-CM | POA: Diagnosis not present

## 2017-02-11 DIAGNOSIS — I130 Hypertensive heart and chronic kidney disease with heart failure and stage 1 through stage 4 chronic kidney disease, or unspecified chronic kidney disease: Secondary | ICD-10-CM | POA: Diagnosis not present

## 2017-02-11 DIAGNOSIS — I2510 Atherosclerotic heart disease of native coronary artery without angina pectoris: Secondary | ICD-10-CM | POA: Diagnosis not present

## 2017-02-11 DIAGNOSIS — I4891 Unspecified atrial fibrillation: Secondary | ICD-10-CM | POA: Diagnosis not present

## 2017-02-11 DIAGNOSIS — G8311 Monoplegia of lower limb affecting right dominant side: Secondary | ICD-10-CM | POA: Diagnosis not present

## 2017-02-11 DIAGNOSIS — I69320 Aphasia following cerebral infarction: Secondary | ICD-10-CM | POA: Diagnosis not present

## 2017-02-11 DIAGNOSIS — E7800 Pure hypercholesterolemia, unspecified: Secondary | ICD-10-CM | POA: Diagnosis not present

## 2017-02-11 DIAGNOSIS — D631 Anemia in chronic kidney disease: Secondary | ICD-10-CM | POA: Diagnosis not present

## 2017-02-13 DIAGNOSIS — I2510 Atherosclerotic heart disease of native coronary artery without angina pectoris: Secondary | ICD-10-CM | POA: Diagnosis not present

## 2017-02-13 DIAGNOSIS — E7800 Pure hypercholesterolemia, unspecified: Secondary | ICD-10-CM | POA: Diagnosis not present

## 2017-02-13 DIAGNOSIS — C3490 Malignant neoplasm of unspecified part of unspecified bronchus or lung: Secondary | ICD-10-CM | POA: Diagnosis not present

## 2017-02-13 DIAGNOSIS — I4891 Unspecified atrial fibrillation: Secondary | ICD-10-CM | POA: Diagnosis not present

## 2017-02-13 DIAGNOSIS — D631 Anemia in chronic kidney disease: Secondary | ICD-10-CM | POA: Diagnosis not present

## 2017-02-13 DIAGNOSIS — G8311 Monoplegia of lower limb affecting right dominant side: Secondary | ICD-10-CM | POA: Diagnosis not present

## 2017-02-13 DIAGNOSIS — I255 Ischemic cardiomyopathy: Secondary | ICD-10-CM | POA: Diagnosis not present

## 2017-02-13 DIAGNOSIS — I69320 Aphasia following cerebral infarction: Secondary | ICD-10-CM | POA: Diagnosis not present

## 2017-02-13 DIAGNOSIS — C7951 Secondary malignant neoplasm of bone: Secondary | ICD-10-CM | POA: Diagnosis not present

## 2017-02-13 DIAGNOSIS — N183 Chronic kidney disease, stage 3 (moderate): Secondary | ICD-10-CM | POA: Diagnosis not present

## 2017-02-13 DIAGNOSIS — I5032 Chronic diastolic (congestive) heart failure: Secondary | ICD-10-CM | POA: Diagnosis not present

## 2017-02-13 DIAGNOSIS — I130 Hypertensive heart and chronic kidney disease with heart failure and stage 1 through stage 4 chronic kidney disease, or unspecified chronic kidney disease: Secondary | ICD-10-CM | POA: Diagnosis not present

## 2017-02-13 DIAGNOSIS — Z466 Encounter for fitting and adjustment of urinary device: Secondary | ICD-10-CM | POA: Diagnosis not present

## 2017-02-18 DIAGNOSIS — Z23 Encounter for immunization: Secondary | ICD-10-CM | POA: Diagnosis not present

## 2017-02-18 DIAGNOSIS — R609 Edema, unspecified: Secondary | ICD-10-CM | POA: Diagnosis not present

## 2017-02-18 DIAGNOSIS — N183 Chronic kidney disease, stage 3 (moderate): Secondary | ICD-10-CM | POA: Diagnosis not present

## 2017-02-18 DIAGNOSIS — T83098A Other mechanical complication of other indwelling urethral catheter, initial encounter: Secondary | ICD-10-CM | POA: Diagnosis not present

## 2017-02-18 DIAGNOSIS — I639 Cerebral infarction, unspecified: Secondary | ICD-10-CM | POA: Diagnosis not present

## 2017-02-18 DIAGNOSIS — C3490 Malignant neoplasm of unspecified part of unspecified bronchus or lung: Secondary | ICD-10-CM | POA: Diagnosis not present

## 2017-02-19 DIAGNOSIS — I69320 Aphasia following cerebral infarction: Secondary | ICD-10-CM | POA: Diagnosis not present

## 2017-02-19 DIAGNOSIS — I4891 Unspecified atrial fibrillation: Secondary | ICD-10-CM | POA: Diagnosis not present

## 2017-02-19 DIAGNOSIS — D631 Anemia in chronic kidney disease: Secondary | ICD-10-CM | POA: Diagnosis not present

## 2017-02-19 DIAGNOSIS — C7951 Secondary malignant neoplasm of bone: Secondary | ICD-10-CM | POA: Diagnosis not present

## 2017-02-19 DIAGNOSIS — I2510 Atherosclerotic heart disease of native coronary artery without angina pectoris: Secondary | ICD-10-CM | POA: Diagnosis not present

## 2017-02-19 DIAGNOSIS — C3490 Malignant neoplasm of unspecified part of unspecified bronchus or lung: Secondary | ICD-10-CM | POA: Diagnosis not present

## 2017-02-19 DIAGNOSIS — Z466 Encounter for fitting and adjustment of urinary device: Secondary | ICD-10-CM | POA: Diagnosis not present

## 2017-02-19 DIAGNOSIS — I5032 Chronic diastolic (congestive) heart failure: Secondary | ICD-10-CM | POA: Diagnosis not present

## 2017-02-19 DIAGNOSIS — N183 Chronic kidney disease, stage 3 (moderate): Secondary | ICD-10-CM | POA: Diagnosis not present

## 2017-02-19 DIAGNOSIS — G8311 Monoplegia of lower limb affecting right dominant side: Secondary | ICD-10-CM | POA: Diagnosis not present

## 2017-02-19 DIAGNOSIS — E7800 Pure hypercholesterolemia, unspecified: Secondary | ICD-10-CM | POA: Diagnosis not present

## 2017-02-19 DIAGNOSIS — I130 Hypertensive heart and chronic kidney disease with heart failure and stage 1 through stage 4 chronic kidney disease, or unspecified chronic kidney disease: Secondary | ICD-10-CM | POA: Diagnosis not present

## 2017-02-19 DIAGNOSIS — I255 Ischemic cardiomyopathy: Secondary | ICD-10-CM | POA: Diagnosis not present

## 2017-02-20 DIAGNOSIS — C7951 Secondary malignant neoplasm of bone: Secondary | ICD-10-CM | POA: Diagnosis not present

## 2017-02-20 DIAGNOSIS — G8311 Monoplegia of lower limb affecting right dominant side: Secondary | ICD-10-CM | POA: Diagnosis not present

## 2017-02-20 DIAGNOSIS — E7800 Pure hypercholesterolemia, unspecified: Secondary | ICD-10-CM | POA: Diagnosis not present

## 2017-02-20 DIAGNOSIS — I255 Ischemic cardiomyopathy: Secondary | ICD-10-CM | POA: Diagnosis not present

## 2017-02-20 DIAGNOSIS — D631 Anemia in chronic kidney disease: Secondary | ICD-10-CM | POA: Diagnosis not present

## 2017-02-20 DIAGNOSIS — I130 Hypertensive heart and chronic kidney disease with heart failure and stage 1 through stage 4 chronic kidney disease, or unspecified chronic kidney disease: Secondary | ICD-10-CM | POA: Diagnosis not present

## 2017-02-20 DIAGNOSIS — I5032 Chronic diastolic (congestive) heart failure: Secondary | ICD-10-CM | POA: Diagnosis not present

## 2017-02-20 DIAGNOSIS — N183 Chronic kidney disease, stage 3 (moderate): Secondary | ICD-10-CM | POA: Diagnosis not present

## 2017-02-20 DIAGNOSIS — I69320 Aphasia following cerebral infarction: Secondary | ICD-10-CM | POA: Diagnosis not present

## 2017-02-20 DIAGNOSIS — I2510 Atherosclerotic heart disease of native coronary artery without angina pectoris: Secondary | ICD-10-CM | POA: Diagnosis not present

## 2017-02-20 DIAGNOSIS — I4891 Unspecified atrial fibrillation: Secondary | ICD-10-CM | POA: Diagnosis not present

## 2017-02-20 DIAGNOSIS — Z466 Encounter for fitting and adjustment of urinary device: Secondary | ICD-10-CM | POA: Diagnosis not present

## 2017-02-20 DIAGNOSIS — C3490 Malignant neoplasm of unspecified part of unspecified bronchus or lung: Secondary | ICD-10-CM | POA: Diagnosis not present

## 2017-02-25 DIAGNOSIS — I2510 Atherosclerotic heart disease of native coronary artery without angina pectoris: Secondary | ICD-10-CM | POA: Diagnosis not present

## 2017-02-25 DIAGNOSIS — I5032 Chronic diastolic (congestive) heart failure: Secondary | ICD-10-CM | POA: Diagnosis not present

## 2017-02-25 DIAGNOSIS — I69320 Aphasia following cerebral infarction: Secondary | ICD-10-CM | POA: Diagnosis not present

## 2017-02-25 DIAGNOSIS — E7800 Pure hypercholesterolemia, unspecified: Secondary | ICD-10-CM | POA: Diagnosis not present

## 2017-02-25 DIAGNOSIS — I4891 Unspecified atrial fibrillation: Secondary | ICD-10-CM | POA: Diagnosis not present

## 2017-02-25 DIAGNOSIS — D631 Anemia in chronic kidney disease: Secondary | ICD-10-CM | POA: Diagnosis not present

## 2017-02-25 DIAGNOSIS — G8311 Monoplegia of lower limb affecting right dominant side: Secondary | ICD-10-CM | POA: Diagnosis not present

## 2017-02-25 DIAGNOSIS — N183 Chronic kidney disease, stage 3 (moderate): Secondary | ICD-10-CM | POA: Diagnosis not present

## 2017-02-25 DIAGNOSIS — I255 Ischemic cardiomyopathy: Secondary | ICD-10-CM | POA: Diagnosis not present

## 2017-02-25 DIAGNOSIS — C7951 Secondary malignant neoplasm of bone: Secondary | ICD-10-CM | POA: Diagnosis not present

## 2017-02-25 DIAGNOSIS — I130 Hypertensive heart and chronic kidney disease with heart failure and stage 1 through stage 4 chronic kidney disease, or unspecified chronic kidney disease: Secondary | ICD-10-CM | POA: Diagnosis not present

## 2017-02-25 DIAGNOSIS — C3490 Malignant neoplasm of unspecified part of unspecified bronchus or lung: Secondary | ICD-10-CM | POA: Diagnosis not present

## 2017-02-25 DIAGNOSIS — Z466 Encounter for fitting and adjustment of urinary device: Secondary | ICD-10-CM | POA: Diagnosis not present

## 2017-02-27 DIAGNOSIS — D631 Anemia in chronic kidney disease: Secondary | ICD-10-CM | POA: Diagnosis not present

## 2017-02-27 DIAGNOSIS — I5032 Chronic diastolic (congestive) heart failure: Secondary | ICD-10-CM | POA: Diagnosis not present

## 2017-02-27 DIAGNOSIS — C7951 Secondary malignant neoplasm of bone: Secondary | ICD-10-CM | POA: Diagnosis not present

## 2017-02-27 DIAGNOSIS — I255 Ischemic cardiomyopathy: Secondary | ICD-10-CM | POA: Diagnosis not present

## 2017-02-27 DIAGNOSIS — I2510 Atherosclerotic heart disease of native coronary artery without angina pectoris: Secondary | ICD-10-CM | POA: Diagnosis not present

## 2017-02-27 DIAGNOSIS — I4891 Unspecified atrial fibrillation: Secondary | ICD-10-CM | POA: Diagnosis not present

## 2017-02-27 DIAGNOSIS — N183 Chronic kidney disease, stage 3 (moderate): Secondary | ICD-10-CM | POA: Diagnosis not present

## 2017-02-27 DIAGNOSIS — E7800 Pure hypercholesterolemia, unspecified: Secondary | ICD-10-CM | POA: Diagnosis not present

## 2017-02-27 DIAGNOSIS — G8311 Monoplegia of lower limb affecting right dominant side: Secondary | ICD-10-CM | POA: Diagnosis not present

## 2017-02-27 DIAGNOSIS — C3490 Malignant neoplasm of unspecified part of unspecified bronchus or lung: Secondary | ICD-10-CM | POA: Diagnosis not present

## 2017-02-27 DIAGNOSIS — Z466 Encounter for fitting and adjustment of urinary device: Secondary | ICD-10-CM | POA: Diagnosis not present

## 2017-02-27 DIAGNOSIS — I69320 Aphasia following cerebral infarction: Secondary | ICD-10-CM | POA: Diagnosis not present

## 2017-02-27 DIAGNOSIS — I130 Hypertensive heart and chronic kidney disease with heart failure and stage 1 through stage 4 chronic kidney disease, or unspecified chronic kidney disease: Secondary | ICD-10-CM | POA: Diagnosis not present

## 2017-03-03 ENCOUNTER — Ambulatory Visit: Payer: Medicare Other | Admitting: Internal Medicine

## 2017-03-03 ENCOUNTER — Other Ambulatory Visit: Payer: Medicare Other

## 2017-03-03 ENCOUNTER — Ambulatory Visit: Payer: Medicare Other

## 2017-03-04 DIAGNOSIS — I69320 Aphasia following cerebral infarction: Secondary | ICD-10-CM | POA: Diagnosis not present

## 2017-03-04 DIAGNOSIS — G8311 Monoplegia of lower limb affecting right dominant side: Secondary | ICD-10-CM | POA: Diagnosis not present

## 2017-03-04 DIAGNOSIS — I255 Ischemic cardiomyopathy: Secondary | ICD-10-CM | POA: Diagnosis not present

## 2017-03-04 DIAGNOSIS — Z466 Encounter for fitting and adjustment of urinary device: Secondary | ICD-10-CM | POA: Diagnosis not present

## 2017-03-04 DIAGNOSIS — I130 Hypertensive heart and chronic kidney disease with heart failure and stage 1 through stage 4 chronic kidney disease, or unspecified chronic kidney disease: Secondary | ICD-10-CM | POA: Diagnosis not present

## 2017-03-04 DIAGNOSIS — N183 Chronic kidney disease, stage 3 (moderate): Secondary | ICD-10-CM | POA: Diagnosis not present

## 2017-03-04 DIAGNOSIS — C3490 Malignant neoplasm of unspecified part of unspecified bronchus or lung: Secondary | ICD-10-CM | POA: Diagnosis not present

## 2017-03-04 DIAGNOSIS — I4891 Unspecified atrial fibrillation: Secondary | ICD-10-CM | POA: Diagnosis not present

## 2017-03-04 DIAGNOSIS — C7951 Secondary malignant neoplasm of bone: Secondary | ICD-10-CM | POA: Diagnosis not present

## 2017-03-04 DIAGNOSIS — D631 Anemia in chronic kidney disease: Secondary | ICD-10-CM | POA: Diagnosis not present

## 2017-03-04 DIAGNOSIS — E7800 Pure hypercholesterolemia, unspecified: Secondary | ICD-10-CM | POA: Diagnosis not present

## 2017-03-04 DIAGNOSIS — I5032 Chronic diastolic (congestive) heart failure: Secondary | ICD-10-CM | POA: Diagnosis not present

## 2017-03-04 DIAGNOSIS — I2510 Atherosclerotic heart disease of native coronary artery without angina pectoris: Secondary | ICD-10-CM | POA: Diagnosis not present

## 2017-03-06 DIAGNOSIS — I69320 Aphasia following cerebral infarction: Secondary | ICD-10-CM | POA: Diagnosis not present

## 2017-03-06 DIAGNOSIS — G8311 Monoplegia of lower limb affecting right dominant side: Secondary | ICD-10-CM | POA: Diagnosis not present

## 2017-03-06 DIAGNOSIS — E7800 Pure hypercholesterolemia, unspecified: Secondary | ICD-10-CM | POA: Diagnosis not present

## 2017-03-06 DIAGNOSIS — I5032 Chronic diastolic (congestive) heart failure: Secondary | ICD-10-CM | POA: Diagnosis not present

## 2017-03-06 DIAGNOSIS — D631 Anemia in chronic kidney disease: Secondary | ICD-10-CM | POA: Diagnosis not present

## 2017-03-06 DIAGNOSIS — I130 Hypertensive heart and chronic kidney disease with heart failure and stage 1 through stage 4 chronic kidney disease, or unspecified chronic kidney disease: Secondary | ICD-10-CM | POA: Diagnosis not present

## 2017-03-06 DIAGNOSIS — C7951 Secondary malignant neoplasm of bone: Secondary | ICD-10-CM | POA: Diagnosis not present

## 2017-03-06 DIAGNOSIS — I2510 Atherosclerotic heart disease of native coronary artery without angina pectoris: Secondary | ICD-10-CM | POA: Diagnosis not present

## 2017-03-06 DIAGNOSIS — N183 Chronic kidney disease, stage 3 (moderate): Secondary | ICD-10-CM | POA: Diagnosis not present

## 2017-03-06 DIAGNOSIS — I255 Ischemic cardiomyopathy: Secondary | ICD-10-CM | POA: Diagnosis not present

## 2017-03-06 DIAGNOSIS — I4891 Unspecified atrial fibrillation: Secondary | ICD-10-CM | POA: Diagnosis not present

## 2017-03-06 DIAGNOSIS — C3490 Malignant neoplasm of unspecified part of unspecified bronchus or lung: Secondary | ICD-10-CM | POA: Diagnosis not present

## 2017-03-06 DIAGNOSIS — Z466 Encounter for fitting and adjustment of urinary device: Secondary | ICD-10-CM | POA: Diagnosis not present

## 2017-03-08 DIAGNOSIS — I2510 Atherosclerotic heart disease of native coronary artery without angina pectoris: Secondary | ICD-10-CM | POA: Diagnosis not present

## 2017-03-08 DIAGNOSIS — G8311 Monoplegia of lower limb affecting right dominant side: Secondary | ICD-10-CM | POA: Diagnosis not present

## 2017-03-08 DIAGNOSIS — I5032 Chronic diastolic (congestive) heart failure: Secondary | ICD-10-CM | POA: Diagnosis not present

## 2017-03-08 DIAGNOSIS — N183 Chronic kidney disease, stage 3 (moderate): Secondary | ICD-10-CM | POA: Diagnosis not present

## 2017-03-08 DIAGNOSIS — Z466 Encounter for fitting and adjustment of urinary device: Secondary | ICD-10-CM | POA: Diagnosis not present

## 2017-03-08 DIAGNOSIS — I130 Hypertensive heart and chronic kidney disease with heart failure and stage 1 through stage 4 chronic kidney disease, or unspecified chronic kidney disease: Secondary | ICD-10-CM | POA: Diagnosis not present

## 2017-03-08 DIAGNOSIS — I69320 Aphasia following cerebral infarction: Secondary | ICD-10-CM | POA: Diagnosis not present

## 2017-03-08 DIAGNOSIS — I4891 Unspecified atrial fibrillation: Secondary | ICD-10-CM | POA: Diagnosis not present

## 2017-03-08 DIAGNOSIS — C7951 Secondary malignant neoplasm of bone: Secondary | ICD-10-CM | POA: Diagnosis not present

## 2017-03-08 DIAGNOSIS — I255 Ischemic cardiomyopathy: Secondary | ICD-10-CM | POA: Diagnosis not present

## 2017-03-08 DIAGNOSIS — C3490 Malignant neoplasm of unspecified part of unspecified bronchus or lung: Secondary | ICD-10-CM | POA: Diagnosis not present

## 2017-03-08 DIAGNOSIS — D631 Anemia in chronic kidney disease: Secondary | ICD-10-CM | POA: Diagnosis not present

## 2017-03-08 DIAGNOSIS — E7800 Pure hypercholesterolemia, unspecified: Secondary | ICD-10-CM | POA: Diagnosis not present

## 2017-03-11 DIAGNOSIS — I2510 Atherosclerotic heart disease of native coronary artery without angina pectoris: Secondary | ICD-10-CM | POA: Diagnosis not present

## 2017-03-11 DIAGNOSIS — C7951 Secondary malignant neoplasm of bone: Secondary | ICD-10-CM | POA: Diagnosis not present

## 2017-03-11 DIAGNOSIS — C3490 Malignant neoplasm of unspecified part of unspecified bronchus or lung: Secondary | ICD-10-CM | POA: Diagnosis not present

## 2017-03-11 DIAGNOSIS — E7800 Pure hypercholesterolemia, unspecified: Secondary | ICD-10-CM | POA: Diagnosis not present

## 2017-03-11 DIAGNOSIS — G8311 Monoplegia of lower limb affecting right dominant side: Secondary | ICD-10-CM | POA: Diagnosis not present

## 2017-03-11 DIAGNOSIS — I69320 Aphasia following cerebral infarction: Secondary | ICD-10-CM | POA: Diagnosis not present

## 2017-03-11 DIAGNOSIS — Z466 Encounter for fitting and adjustment of urinary device: Secondary | ICD-10-CM | POA: Diagnosis not present

## 2017-03-11 DIAGNOSIS — I4891 Unspecified atrial fibrillation: Secondary | ICD-10-CM | POA: Diagnosis not present

## 2017-03-11 DIAGNOSIS — D631 Anemia in chronic kidney disease: Secondary | ICD-10-CM | POA: Diagnosis not present

## 2017-03-11 DIAGNOSIS — I5032 Chronic diastolic (congestive) heart failure: Secondary | ICD-10-CM | POA: Diagnosis not present

## 2017-03-11 DIAGNOSIS — I130 Hypertensive heart and chronic kidney disease with heart failure and stage 1 through stage 4 chronic kidney disease, or unspecified chronic kidney disease: Secondary | ICD-10-CM | POA: Diagnosis not present

## 2017-03-11 DIAGNOSIS — N183 Chronic kidney disease, stage 3 (moderate): Secondary | ICD-10-CM | POA: Diagnosis not present

## 2017-03-11 DIAGNOSIS — I255 Ischemic cardiomyopathy: Secondary | ICD-10-CM | POA: Diagnosis not present

## 2017-03-13 DIAGNOSIS — I255 Ischemic cardiomyopathy: Secondary | ICD-10-CM | POA: Diagnosis not present

## 2017-03-13 DIAGNOSIS — I5032 Chronic diastolic (congestive) heart failure: Secondary | ICD-10-CM | POA: Diagnosis not present

## 2017-03-13 DIAGNOSIS — I130 Hypertensive heart and chronic kidney disease with heart failure and stage 1 through stage 4 chronic kidney disease, or unspecified chronic kidney disease: Secondary | ICD-10-CM | POA: Diagnosis not present

## 2017-03-13 DIAGNOSIS — C7951 Secondary malignant neoplasm of bone: Secondary | ICD-10-CM | POA: Diagnosis not present

## 2017-03-13 DIAGNOSIS — C3490 Malignant neoplasm of unspecified part of unspecified bronchus or lung: Secondary | ICD-10-CM | POA: Diagnosis not present

## 2017-03-13 DIAGNOSIS — D631 Anemia in chronic kidney disease: Secondary | ICD-10-CM | POA: Diagnosis not present

## 2017-03-13 DIAGNOSIS — N183 Chronic kidney disease, stage 3 (moderate): Secondary | ICD-10-CM | POA: Diagnosis not present

## 2017-03-13 DIAGNOSIS — G8311 Monoplegia of lower limb affecting right dominant side: Secondary | ICD-10-CM | POA: Diagnosis not present

## 2017-03-13 DIAGNOSIS — I4891 Unspecified atrial fibrillation: Secondary | ICD-10-CM | POA: Diagnosis not present

## 2017-03-13 DIAGNOSIS — I69320 Aphasia following cerebral infarction: Secondary | ICD-10-CM | POA: Diagnosis not present

## 2017-03-13 DIAGNOSIS — E7800 Pure hypercholesterolemia, unspecified: Secondary | ICD-10-CM | POA: Diagnosis not present

## 2017-03-13 DIAGNOSIS — I2510 Atherosclerotic heart disease of native coronary artery without angina pectoris: Secondary | ICD-10-CM | POA: Diagnosis not present

## 2017-03-13 DIAGNOSIS — Z466 Encounter for fitting and adjustment of urinary device: Secondary | ICD-10-CM | POA: Diagnosis not present

## 2017-03-20 DIAGNOSIS — I2510 Atherosclerotic heart disease of native coronary artery without angina pectoris: Secondary | ICD-10-CM | POA: Diagnosis not present

## 2017-03-20 DIAGNOSIS — D631 Anemia in chronic kidney disease: Secondary | ICD-10-CM | POA: Diagnosis not present

## 2017-03-20 DIAGNOSIS — I5032 Chronic diastolic (congestive) heart failure: Secondary | ICD-10-CM | POA: Diagnosis not present

## 2017-03-20 DIAGNOSIS — C7951 Secondary malignant neoplasm of bone: Secondary | ICD-10-CM | POA: Diagnosis not present

## 2017-03-20 DIAGNOSIS — I69320 Aphasia following cerebral infarction: Secondary | ICD-10-CM | POA: Diagnosis not present

## 2017-03-20 DIAGNOSIS — G8311 Monoplegia of lower limb affecting right dominant side: Secondary | ICD-10-CM | POA: Diagnosis not present

## 2017-03-20 DIAGNOSIS — I255 Ischemic cardiomyopathy: Secondary | ICD-10-CM | POA: Diagnosis not present

## 2017-03-20 DIAGNOSIS — I130 Hypertensive heart and chronic kidney disease with heart failure and stage 1 through stage 4 chronic kidney disease, or unspecified chronic kidney disease: Secondary | ICD-10-CM | POA: Diagnosis not present

## 2017-03-20 DIAGNOSIS — C3490 Malignant neoplasm of unspecified part of unspecified bronchus or lung: Secondary | ICD-10-CM | POA: Diagnosis not present

## 2017-03-20 DIAGNOSIS — E7800 Pure hypercholesterolemia, unspecified: Secondary | ICD-10-CM | POA: Diagnosis not present

## 2017-03-20 DIAGNOSIS — Z466 Encounter for fitting and adjustment of urinary device: Secondary | ICD-10-CM | POA: Diagnosis not present

## 2017-03-20 DIAGNOSIS — I4891 Unspecified atrial fibrillation: Secondary | ICD-10-CM | POA: Diagnosis not present

## 2017-03-20 DIAGNOSIS — N183 Chronic kidney disease, stage 3 (moderate): Secondary | ICD-10-CM | POA: Diagnosis not present

## 2017-03-24 ENCOUNTER — Ambulatory Visit: Payer: Medicare Other

## 2017-03-24 ENCOUNTER — Other Ambulatory Visit: Payer: Medicare Other

## 2017-03-24 ENCOUNTER — Ambulatory Visit: Payer: Medicare Other | Admitting: Internal Medicine

## 2017-03-24 DIAGNOSIS — G8311 Monoplegia of lower limb affecting right dominant side: Secondary | ICD-10-CM | POA: Diagnosis not present

## 2017-03-24 DIAGNOSIS — I2510 Atherosclerotic heart disease of native coronary artery without angina pectoris: Secondary | ICD-10-CM | POA: Diagnosis not present

## 2017-03-24 DIAGNOSIS — I69320 Aphasia following cerebral infarction: Secondary | ICD-10-CM | POA: Diagnosis not present

## 2017-03-24 DIAGNOSIS — N183 Chronic kidney disease, stage 3 (moderate): Secondary | ICD-10-CM | POA: Diagnosis not present

## 2017-03-24 DIAGNOSIS — C7951 Secondary malignant neoplasm of bone: Secondary | ICD-10-CM | POA: Diagnosis not present

## 2017-03-24 DIAGNOSIS — I5032 Chronic diastolic (congestive) heart failure: Secondary | ICD-10-CM | POA: Diagnosis not present

## 2017-03-24 DIAGNOSIS — I4891 Unspecified atrial fibrillation: Secondary | ICD-10-CM | POA: Diagnosis not present

## 2017-03-24 DIAGNOSIS — E7800 Pure hypercholesterolemia, unspecified: Secondary | ICD-10-CM | POA: Diagnosis not present

## 2017-03-24 DIAGNOSIS — I130 Hypertensive heart and chronic kidney disease with heart failure and stage 1 through stage 4 chronic kidney disease, or unspecified chronic kidney disease: Secondary | ICD-10-CM | POA: Diagnosis not present

## 2017-03-24 DIAGNOSIS — D631 Anemia in chronic kidney disease: Secondary | ICD-10-CM | POA: Diagnosis not present

## 2017-03-24 DIAGNOSIS — Z466 Encounter for fitting and adjustment of urinary device: Secondary | ICD-10-CM | POA: Diagnosis not present

## 2017-03-24 DIAGNOSIS — I255 Ischemic cardiomyopathy: Secondary | ICD-10-CM | POA: Diagnosis not present

## 2017-03-24 DIAGNOSIS — C3490 Malignant neoplasm of unspecified part of unspecified bronchus or lung: Secondary | ICD-10-CM | POA: Diagnosis not present

## 2017-03-25 DIAGNOSIS — C3490 Malignant neoplasm of unspecified part of unspecified bronchus or lung: Secondary | ICD-10-CM | POA: Diagnosis not present

## 2017-03-25 DIAGNOSIS — E7800 Pure hypercholesterolemia, unspecified: Secondary | ICD-10-CM | POA: Diagnosis not present

## 2017-03-25 DIAGNOSIS — D631 Anemia in chronic kidney disease: Secondary | ICD-10-CM | POA: Diagnosis not present

## 2017-03-25 DIAGNOSIS — I4891 Unspecified atrial fibrillation: Secondary | ICD-10-CM | POA: Diagnosis not present

## 2017-03-25 DIAGNOSIS — I2510 Atherosclerotic heart disease of native coronary artery without angina pectoris: Secondary | ICD-10-CM | POA: Diagnosis not present

## 2017-03-25 DIAGNOSIS — G8311 Monoplegia of lower limb affecting right dominant side: Secondary | ICD-10-CM | POA: Diagnosis not present

## 2017-03-25 DIAGNOSIS — N183 Chronic kidney disease, stage 3 (moderate): Secondary | ICD-10-CM | POA: Diagnosis not present

## 2017-03-25 DIAGNOSIS — I5032 Chronic diastolic (congestive) heart failure: Secondary | ICD-10-CM | POA: Diagnosis not present

## 2017-03-25 DIAGNOSIS — Z466 Encounter for fitting and adjustment of urinary device: Secondary | ICD-10-CM | POA: Diagnosis not present

## 2017-03-25 DIAGNOSIS — I69320 Aphasia following cerebral infarction: Secondary | ICD-10-CM | POA: Diagnosis not present

## 2017-03-25 DIAGNOSIS — I255 Ischemic cardiomyopathy: Secondary | ICD-10-CM | POA: Diagnosis not present

## 2017-03-25 DIAGNOSIS — I130 Hypertensive heart and chronic kidney disease with heart failure and stage 1 through stage 4 chronic kidney disease, or unspecified chronic kidney disease: Secondary | ICD-10-CM | POA: Diagnosis not present

## 2017-03-25 DIAGNOSIS — C7951 Secondary malignant neoplasm of bone: Secondary | ICD-10-CM | POA: Diagnosis not present

## 2017-03-27 DIAGNOSIS — I130 Hypertensive heart and chronic kidney disease with heart failure and stage 1 through stage 4 chronic kidney disease, or unspecified chronic kidney disease: Secondary | ICD-10-CM | POA: Diagnosis not present

## 2017-03-27 DIAGNOSIS — I69320 Aphasia following cerebral infarction: Secondary | ICD-10-CM | POA: Diagnosis not present

## 2017-03-27 DIAGNOSIS — C3490 Malignant neoplasm of unspecified part of unspecified bronchus or lung: Secondary | ICD-10-CM | POA: Diagnosis not present

## 2017-03-27 DIAGNOSIS — G8311 Monoplegia of lower limb affecting right dominant side: Secondary | ICD-10-CM | POA: Diagnosis not present

## 2017-03-27 DIAGNOSIS — D631 Anemia in chronic kidney disease: Secondary | ICD-10-CM | POA: Diagnosis not present

## 2017-03-27 DIAGNOSIS — I2510 Atherosclerotic heart disease of native coronary artery without angina pectoris: Secondary | ICD-10-CM | POA: Diagnosis not present

## 2017-03-27 DIAGNOSIS — Z466 Encounter for fitting and adjustment of urinary device: Secondary | ICD-10-CM | POA: Diagnosis not present

## 2017-03-27 DIAGNOSIS — I255 Ischemic cardiomyopathy: Secondary | ICD-10-CM | POA: Diagnosis not present

## 2017-03-27 DIAGNOSIS — C7951 Secondary malignant neoplasm of bone: Secondary | ICD-10-CM | POA: Diagnosis not present

## 2017-03-27 DIAGNOSIS — N183 Chronic kidney disease, stage 3 (moderate): Secondary | ICD-10-CM | POA: Diagnosis not present

## 2017-03-27 DIAGNOSIS — I5032 Chronic diastolic (congestive) heart failure: Secondary | ICD-10-CM | POA: Diagnosis not present

## 2017-03-27 DIAGNOSIS — I4891 Unspecified atrial fibrillation: Secondary | ICD-10-CM | POA: Diagnosis not present

## 2017-03-27 DIAGNOSIS — E7800 Pure hypercholesterolemia, unspecified: Secondary | ICD-10-CM | POA: Diagnosis not present

## 2017-03-28 DIAGNOSIS — D631 Anemia in chronic kidney disease: Secondary | ICD-10-CM | POA: Diagnosis not present

## 2017-03-28 DIAGNOSIS — G8311 Monoplegia of lower limb affecting right dominant side: Secondary | ICD-10-CM | POA: Diagnosis not present

## 2017-03-28 DIAGNOSIS — I69320 Aphasia following cerebral infarction: Secondary | ICD-10-CM | POA: Diagnosis not present

## 2017-03-28 DIAGNOSIS — I4891 Unspecified atrial fibrillation: Secondary | ICD-10-CM | POA: Diagnosis not present

## 2017-03-28 DIAGNOSIS — I255 Ischemic cardiomyopathy: Secondary | ICD-10-CM | POA: Diagnosis not present

## 2017-03-28 DIAGNOSIS — I130 Hypertensive heart and chronic kidney disease with heart failure and stage 1 through stage 4 chronic kidney disease, or unspecified chronic kidney disease: Secondary | ICD-10-CM | POA: Diagnosis not present

## 2017-03-28 DIAGNOSIS — I2510 Atherosclerotic heart disease of native coronary artery without angina pectoris: Secondary | ICD-10-CM | POA: Diagnosis not present

## 2017-03-28 DIAGNOSIS — N183 Chronic kidney disease, stage 3 (moderate): Secondary | ICD-10-CM | POA: Diagnosis not present

## 2017-03-28 DIAGNOSIS — C3490 Malignant neoplasm of unspecified part of unspecified bronchus or lung: Secondary | ICD-10-CM | POA: Diagnosis not present

## 2017-03-28 DIAGNOSIS — E7800 Pure hypercholesterolemia, unspecified: Secondary | ICD-10-CM | POA: Diagnosis not present

## 2017-03-28 DIAGNOSIS — I5032 Chronic diastolic (congestive) heart failure: Secondary | ICD-10-CM | POA: Diagnosis not present

## 2017-03-28 DIAGNOSIS — C7951 Secondary malignant neoplasm of bone: Secondary | ICD-10-CM | POA: Diagnosis not present

## 2017-03-28 DIAGNOSIS — Z466 Encounter for fitting and adjustment of urinary device: Secondary | ICD-10-CM | POA: Diagnosis not present

## 2017-04-01 DIAGNOSIS — I4891 Unspecified atrial fibrillation: Secondary | ICD-10-CM | POA: Diagnosis not present

## 2017-04-01 DIAGNOSIS — C3490 Malignant neoplasm of unspecified part of unspecified bronchus or lung: Secondary | ICD-10-CM | POA: Diagnosis not present

## 2017-04-01 DIAGNOSIS — I69320 Aphasia following cerebral infarction: Secondary | ICD-10-CM | POA: Diagnosis not present

## 2017-04-01 DIAGNOSIS — E7800 Pure hypercholesterolemia, unspecified: Secondary | ICD-10-CM | POA: Diagnosis not present

## 2017-04-01 DIAGNOSIS — N183 Chronic kidney disease, stage 3 (moderate): Secondary | ICD-10-CM | POA: Diagnosis not present

## 2017-04-01 DIAGNOSIS — G8311 Monoplegia of lower limb affecting right dominant side: Secondary | ICD-10-CM | POA: Diagnosis not present

## 2017-04-01 DIAGNOSIS — C7951 Secondary malignant neoplasm of bone: Secondary | ICD-10-CM | POA: Diagnosis not present

## 2017-04-01 DIAGNOSIS — I255 Ischemic cardiomyopathy: Secondary | ICD-10-CM | POA: Diagnosis not present

## 2017-04-01 DIAGNOSIS — I130 Hypertensive heart and chronic kidney disease with heart failure and stage 1 through stage 4 chronic kidney disease, or unspecified chronic kidney disease: Secondary | ICD-10-CM | POA: Diagnosis not present

## 2017-04-01 DIAGNOSIS — D631 Anemia in chronic kidney disease: Secondary | ICD-10-CM | POA: Diagnosis not present

## 2017-04-01 DIAGNOSIS — I5032 Chronic diastolic (congestive) heart failure: Secondary | ICD-10-CM | POA: Diagnosis not present

## 2017-04-01 DIAGNOSIS — I2510 Atherosclerotic heart disease of native coronary artery without angina pectoris: Secondary | ICD-10-CM | POA: Diagnosis not present

## 2017-04-01 DIAGNOSIS — Z466 Encounter for fitting and adjustment of urinary device: Secondary | ICD-10-CM | POA: Diagnosis not present

## 2017-04-03 DIAGNOSIS — N183 Chronic kidney disease, stage 3 (moderate): Secondary | ICD-10-CM | POA: Diagnosis not present

## 2017-04-03 DIAGNOSIS — C7951 Secondary malignant neoplasm of bone: Secondary | ICD-10-CM | POA: Diagnosis not present

## 2017-04-03 DIAGNOSIS — E7800 Pure hypercholesterolemia, unspecified: Secondary | ICD-10-CM | POA: Diagnosis not present

## 2017-04-03 DIAGNOSIS — I4891 Unspecified atrial fibrillation: Secondary | ICD-10-CM | POA: Diagnosis not present

## 2017-04-03 DIAGNOSIS — I255 Ischemic cardiomyopathy: Secondary | ICD-10-CM | POA: Diagnosis not present

## 2017-04-03 DIAGNOSIS — I130 Hypertensive heart and chronic kidney disease with heart failure and stage 1 through stage 4 chronic kidney disease, or unspecified chronic kidney disease: Secondary | ICD-10-CM | POA: Diagnosis not present

## 2017-04-03 DIAGNOSIS — C3490 Malignant neoplasm of unspecified part of unspecified bronchus or lung: Secondary | ICD-10-CM | POA: Diagnosis not present

## 2017-04-03 DIAGNOSIS — I2510 Atherosclerotic heart disease of native coronary artery without angina pectoris: Secondary | ICD-10-CM | POA: Diagnosis not present

## 2017-04-03 DIAGNOSIS — Z466 Encounter for fitting and adjustment of urinary device: Secondary | ICD-10-CM | POA: Diagnosis not present

## 2017-04-03 DIAGNOSIS — G8311 Monoplegia of lower limb affecting right dominant side: Secondary | ICD-10-CM | POA: Diagnosis not present

## 2017-04-03 DIAGNOSIS — D631 Anemia in chronic kidney disease: Secondary | ICD-10-CM | POA: Diagnosis not present

## 2017-04-03 DIAGNOSIS — I69320 Aphasia following cerebral infarction: Secondary | ICD-10-CM | POA: Diagnosis not present

## 2017-04-03 DIAGNOSIS — I5032 Chronic diastolic (congestive) heart failure: Secondary | ICD-10-CM | POA: Diagnosis not present

## 2017-04-08 DIAGNOSIS — N183 Chronic kidney disease, stage 3 (moderate): Secondary | ICD-10-CM | POA: Diagnosis not present

## 2017-04-08 DIAGNOSIS — D631 Anemia in chronic kidney disease: Secondary | ICD-10-CM | POA: Diagnosis not present

## 2017-04-08 DIAGNOSIS — E7800 Pure hypercholesterolemia, unspecified: Secondary | ICD-10-CM | POA: Diagnosis not present

## 2017-04-08 DIAGNOSIS — G8311 Monoplegia of lower limb affecting right dominant side: Secondary | ICD-10-CM | POA: Diagnosis not present

## 2017-04-08 DIAGNOSIS — C3490 Malignant neoplasm of unspecified part of unspecified bronchus or lung: Secondary | ICD-10-CM | POA: Diagnosis not present

## 2017-04-08 DIAGNOSIS — I4891 Unspecified atrial fibrillation: Secondary | ICD-10-CM | POA: Diagnosis not present

## 2017-04-08 DIAGNOSIS — C7951 Secondary malignant neoplasm of bone: Secondary | ICD-10-CM | POA: Diagnosis not present

## 2017-04-08 DIAGNOSIS — I69320 Aphasia following cerebral infarction: Secondary | ICD-10-CM | POA: Diagnosis not present

## 2017-04-08 DIAGNOSIS — Z466 Encounter for fitting and adjustment of urinary device: Secondary | ICD-10-CM | POA: Diagnosis not present

## 2017-04-08 DIAGNOSIS — I130 Hypertensive heart and chronic kidney disease with heart failure and stage 1 through stage 4 chronic kidney disease, or unspecified chronic kidney disease: Secondary | ICD-10-CM | POA: Diagnosis not present

## 2017-04-08 DIAGNOSIS — I2510 Atherosclerotic heart disease of native coronary artery without angina pectoris: Secondary | ICD-10-CM | POA: Diagnosis not present

## 2017-04-08 DIAGNOSIS — I5032 Chronic diastolic (congestive) heart failure: Secondary | ICD-10-CM | POA: Diagnosis not present

## 2017-04-08 DIAGNOSIS — I255 Ischemic cardiomyopathy: Secondary | ICD-10-CM | POA: Diagnosis not present

## 2017-04-10 DIAGNOSIS — I5032 Chronic diastolic (congestive) heart failure: Secondary | ICD-10-CM | POA: Diagnosis not present

## 2017-04-10 DIAGNOSIS — D631 Anemia in chronic kidney disease: Secondary | ICD-10-CM | POA: Diagnosis not present

## 2017-04-10 DIAGNOSIS — I69320 Aphasia following cerebral infarction: Secondary | ICD-10-CM | POA: Diagnosis not present

## 2017-04-10 DIAGNOSIS — Z466 Encounter for fitting and adjustment of urinary device: Secondary | ICD-10-CM | POA: Diagnosis not present

## 2017-04-10 DIAGNOSIS — I130 Hypertensive heart and chronic kidney disease with heart failure and stage 1 through stage 4 chronic kidney disease, or unspecified chronic kidney disease: Secondary | ICD-10-CM | POA: Diagnosis not present

## 2017-04-10 DIAGNOSIS — I255 Ischemic cardiomyopathy: Secondary | ICD-10-CM | POA: Diagnosis not present

## 2017-04-10 DIAGNOSIS — C7951 Secondary malignant neoplasm of bone: Secondary | ICD-10-CM | POA: Diagnosis not present

## 2017-04-10 DIAGNOSIS — I4891 Unspecified atrial fibrillation: Secondary | ICD-10-CM | POA: Diagnosis not present

## 2017-04-10 DIAGNOSIS — C3490 Malignant neoplasm of unspecified part of unspecified bronchus or lung: Secondary | ICD-10-CM | POA: Diagnosis not present

## 2017-04-10 DIAGNOSIS — G8311 Monoplegia of lower limb affecting right dominant side: Secondary | ICD-10-CM | POA: Diagnosis not present

## 2017-04-10 DIAGNOSIS — I2510 Atherosclerotic heart disease of native coronary artery without angina pectoris: Secondary | ICD-10-CM | POA: Diagnosis not present

## 2017-04-10 DIAGNOSIS — E7800 Pure hypercholesterolemia, unspecified: Secondary | ICD-10-CM | POA: Diagnosis not present

## 2017-04-10 DIAGNOSIS — N183 Chronic kidney disease, stage 3 (moderate): Secondary | ICD-10-CM | POA: Diagnosis not present

## 2017-04-12 DIAGNOSIS — Z466 Encounter for fitting and adjustment of urinary device: Secondary | ICD-10-CM | POA: Diagnosis not present

## 2017-04-12 DIAGNOSIS — I4891 Unspecified atrial fibrillation: Secondary | ICD-10-CM | POA: Diagnosis not present

## 2017-04-12 DIAGNOSIS — I69320 Aphasia following cerebral infarction: Secondary | ICD-10-CM | POA: Diagnosis not present

## 2017-04-12 DIAGNOSIS — C3490 Malignant neoplasm of unspecified part of unspecified bronchus or lung: Secondary | ICD-10-CM | POA: Diagnosis not present

## 2017-04-12 DIAGNOSIS — I130 Hypertensive heart and chronic kidney disease with heart failure and stage 1 through stage 4 chronic kidney disease, or unspecified chronic kidney disease: Secondary | ICD-10-CM | POA: Diagnosis not present

## 2017-04-12 DIAGNOSIS — C7951 Secondary malignant neoplasm of bone: Secondary | ICD-10-CM | POA: Diagnosis not present

## 2017-04-12 DIAGNOSIS — I255 Ischemic cardiomyopathy: Secondary | ICD-10-CM | POA: Diagnosis not present

## 2017-04-12 DIAGNOSIS — D631 Anemia in chronic kidney disease: Secondary | ICD-10-CM | POA: Diagnosis not present

## 2017-04-12 DIAGNOSIS — G8311 Monoplegia of lower limb affecting right dominant side: Secondary | ICD-10-CM | POA: Diagnosis not present

## 2017-04-12 DIAGNOSIS — I2510 Atherosclerotic heart disease of native coronary artery without angina pectoris: Secondary | ICD-10-CM | POA: Diagnosis not present

## 2017-04-12 DIAGNOSIS — N183 Chronic kidney disease, stage 3 (moderate): Secondary | ICD-10-CM | POA: Diagnosis not present

## 2017-04-12 DIAGNOSIS — I5032 Chronic diastolic (congestive) heart failure: Secondary | ICD-10-CM | POA: Diagnosis not present

## 2017-04-12 DIAGNOSIS — E7800 Pure hypercholesterolemia, unspecified: Secondary | ICD-10-CM | POA: Diagnosis not present

## 2017-04-14 ENCOUNTER — Ambulatory Visit: Payer: Medicare Other | Admitting: Neurology

## 2017-04-15 DIAGNOSIS — C7951 Secondary malignant neoplasm of bone: Secondary | ICD-10-CM | POA: Diagnosis not present

## 2017-04-15 DIAGNOSIS — I69320 Aphasia following cerebral infarction: Secondary | ICD-10-CM | POA: Diagnosis not present

## 2017-04-15 DIAGNOSIS — G8311 Monoplegia of lower limb affecting right dominant side: Secondary | ICD-10-CM | POA: Diagnosis not present

## 2017-04-15 DIAGNOSIS — I130 Hypertensive heart and chronic kidney disease with heart failure and stage 1 through stage 4 chronic kidney disease, or unspecified chronic kidney disease: Secondary | ICD-10-CM | POA: Diagnosis not present

## 2017-04-15 DIAGNOSIS — I4891 Unspecified atrial fibrillation: Secondary | ICD-10-CM | POA: Diagnosis not present

## 2017-04-15 DIAGNOSIS — I5032 Chronic diastolic (congestive) heart failure: Secondary | ICD-10-CM | POA: Diagnosis not present

## 2017-04-15 DIAGNOSIS — Z466 Encounter for fitting and adjustment of urinary device: Secondary | ICD-10-CM | POA: Diagnosis not present

## 2017-04-15 DIAGNOSIS — I2510 Atherosclerotic heart disease of native coronary artery without angina pectoris: Secondary | ICD-10-CM | POA: Diagnosis not present

## 2017-04-15 DIAGNOSIS — D631 Anemia in chronic kidney disease: Secondary | ICD-10-CM | POA: Diagnosis not present

## 2017-04-15 DIAGNOSIS — E7800 Pure hypercholesterolemia, unspecified: Secondary | ICD-10-CM | POA: Diagnosis not present

## 2017-04-15 DIAGNOSIS — N183 Chronic kidney disease, stage 3 (moderate): Secondary | ICD-10-CM | POA: Diagnosis not present

## 2017-04-15 DIAGNOSIS — C3490 Malignant neoplasm of unspecified part of unspecified bronchus or lung: Secondary | ICD-10-CM | POA: Diagnosis not present

## 2017-04-15 DIAGNOSIS — I255 Ischemic cardiomyopathy: Secondary | ICD-10-CM | POA: Diagnosis not present

## 2017-04-17 DIAGNOSIS — D631 Anemia in chronic kidney disease: Secondary | ICD-10-CM | POA: Diagnosis not present

## 2017-04-17 DIAGNOSIS — E7800 Pure hypercholesterolemia, unspecified: Secondary | ICD-10-CM | POA: Diagnosis not present

## 2017-04-17 DIAGNOSIS — Z466 Encounter for fitting and adjustment of urinary device: Secondary | ICD-10-CM | POA: Diagnosis not present

## 2017-04-17 DIAGNOSIS — C3490 Malignant neoplasm of unspecified part of unspecified bronchus or lung: Secondary | ICD-10-CM | POA: Diagnosis not present

## 2017-04-17 DIAGNOSIS — C7951 Secondary malignant neoplasm of bone: Secondary | ICD-10-CM | POA: Diagnosis not present

## 2017-04-17 DIAGNOSIS — N183 Chronic kidney disease, stage 3 (moderate): Secondary | ICD-10-CM | POA: Diagnosis not present

## 2017-04-17 DIAGNOSIS — I2510 Atherosclerotic heart disease of native coronary artery without angina pectoris: Secondary | ICD-10-CM | POA: Diagnosis not present

## 2017-04-17 DIAGNOSIS — I5032 Chronic diastolic (congestive) heart failure: Secondary | ICD-10-CM | POA: Diagnosis not present

## 2017-04-17 DIAGNOSIS — I130 Hypertensive heart and chronic kidney disease with heart failure and stage 1 through stage 4 chronic kidney disease, or unspecified chronic kidney disease: Secondary | ICD-10-CM | POA: Diagnosis not present

## 2017-04-17 DIAGNOSIS — I69320 Aphasia following cerebral infarction: Secondary | ICD-10-CM | POA: Diagnosis not present

## 2017-04-17 DIAGNOSIS — I255 Ischemic cardiomyopathy: Secondary | ICD-10-CM | POA: Diagnosis not present

## 2017-04-17 DIAGNOSIS — I4891 Unspecified atrial fibrillation: Secondary | ICD-10-CM | POA: Diagnosis not present

## 2017-04-17 DIAGNOSIS — G8311 Monoplegia of lower limb affecting right dominant side: Secondary | ICD-10-CM | POA: Diagnosis not present

## 2017-04-22 ENCOUNTER — Ambulatory Visit: Payer: Medicare Other | Admitting: Cardiovascular Disease

## 2017-04-22 DIAGNOSIS — I255 Ischemic cardiomyopathy: Secondary | ICD-10-CM | POA: Diagnosis not present

## 2017-04-22 DIAGNOSIS — C3490 Malignant neoplasm of unspecified part of unspecified bronchus or lung: Secondary | ICD-10-CM | POA: Diagnosis not present

## 2017-04-22 DIAGNOSIS — I2510 Atherosclerotic heart disease of native coronary artery without angina pectoris: Secondary | ICD-10-CM | POA: Diagnosis not present

## 2017-04-22 DIAGNOSIS — I5032 Chronic diastolic (congestive) heart failure: Secondary | ICD-10-CM | POA: Diagnosis not present

## 2017-04-22 DIAGNOSIS — Z466 Encounter for fitting and adjustment of urinary device: Secondary | ICD-10-CM | POA: Diagnosis not present

## 2017-04-22 DIAGNOSIS — I130 Hypertensive heart and chronic kidney disease with heart failure and stage 1 through stage 4 chronic kidney disease, or unspecified chronic kidney disease: Secondary | ICD-10-CM | POA: Diagnosis not present

## 2017-04-22 DIAGNOSIS — G8311 Monoplegia of lower limb affecting right dominant side: Secondary | ICD-10-CM | POA: Diagnosis not present

## 2017-04-22 DIAGNOSIS — N183 Chronic kidney disease, stage 3 (moderate): Secondary | ICD-10-CM | POA: Diagnosis not present

## 2017-04-22 DIAGNOSIS — I4891 Unspecified atrial fibrillation: Secondary | ICD-10-CM | POA: Diagnosis not present

## 2017-04-22 DIAGNOSIS — I69320 Aphasia following cerebral infarction: Secondary | ICD-10-CM | POA: Diagnosis not present

## 2017-04-22 DIAGNOSIS — E7800 Pure hypercholesterolemia, unspecified: Secondary | ICD-10-CM | POA: Diagnosis not present

## 2017-04-22 DIAGNOSIS — C7951 Secondary malignant neoplasm of bone: Secondary | ICD-10-CM | POA: Diagnosis not present

## 2017-04-22 DIAGNOSIS — D631 Anemia in chronic kidney disease: Secondary | ICD-10-CM | POA: Diagnosis not present

## 2017-04-29 DIAGNOSIS — D631 Anemia in chronic kidney disease: Secondary | ICD-10-CM | POA: Diagnosis not present

## 2017-04-29 DIAGNOSIS — Z466 Encounter for fitting and adjustment of urinary device: Secondary | ICD-10-CM | POA: Diagnosis not present

## 2017-04-29 DIAGNOSIS — I4891 Unspecified atrial fibrillation: Secondary | ICD-10-CM | POA: Diagnosis not present

## 2017-04-29 DIAGNOSIS — I2510 Atherosclerotic heart disease of native coronary artery without angina pectoris: Secondary | ICD-10-CM | POA: Diagnosis not present

## 2017-04-29 DIAGNOSIS — I130 Hypertensive heart and chronic kidney disease with heart failure and stage 1 through stage 4 chronic kidney disease, or unspecified chronic kidney disease: Secondary | ICD-10-CM | POA: Diagnosis not present

## 2017-04-29 DIAGNOSIS — C3490 Malignant neoplasm of unspecified part of unspecified bronchus or lung: Secondary | ICD-10-CM | POA: Diagnosis not present

## 2017-04-29 DIAGNOSIS — E7800 Pure hypercholesterolemia, unspecified: Secondary | ICD-10-CM | POA: Diagnosis not present

## 2017-04-29 DIAGNOSIS — I255 Ischemic cardiomyopathy: Secondary | ICD-10-CM | POA: Diagnosis not present

## 2017-04-29 DIAGNOSIS — G8311 Monoplegia of lower limb affecting right dominant side: Secondary | ICD-10-CM | POA: Diagnosis not present

## 2017-04-29 DIAGNOSIS — I5032 Chronic diastolic (congestive) heart failure: Secondary | ICD-10-CM | POA: Diagnosis not present

## 2017-04-29 DIAGNOSIS — I69320 Aphasia following cerebral infarction: Secondary | ICD-10-CM | POA: Diagnosis not present

## 2017-04-29 DIAGNOSIS — N183 Chronic kidney disease, stage 3 (moderate): Secondary | ICD-10-CM | POA: Diagnosis not present

## 2017-04-29 DIAGNOSIS — C7951 Secondary malignant neoplasm of bone: Secondary | ICD-10-CM | POA: Diagnosis not present

## 2017-04-30 DIAGNOSIS — E7800 Pure hypercholesterolemia, unspecified: Secondary | ICD-10-CM | POA: Diagnosis not present

## 2017-04-30 DIAGNOSIS — D631 Anemia in chronic kidney disease: Secondary | ICD-10-CM | POA: Diagnosis not present

## 2017-04-30 DIAGNOSIS — I255 Ischemic cardiomyopathy: Secondary | ICD-10-CM | POA: Diagnosis not present

## 2017-04-30 DIAGNOSIS — Z466 Encounter for fitting and adjustment of urinary device: Secondary | ICD-10-CM | POA: Diagnosis not present

## 2017-04-30 DIAGNOSIS — N183 Chronic kidney disease, stage 3 (moderate): Secondary | ICD-10-CM | POA: Diagnosis not present

## 2017-04-30 DIAGNOSIS — C7951 Secondary malignant neoplasm of bone: Secondary | ICD-10-CM | POA: Diagnosis not present

## 2017-04-30 DIAGNOSIS — I130 Hypertensive heart and chronic kidney disease with heart failure and stage 1 through stage 4 chronic kidney disease, or unspecified chronic kidney disease: Secondary | ICD-10-CM | POA: Diagnosis not present

## 2017-04-30 DIAGNOSIS — I2510 Atherosclerotic heart disease of native coronary artery without angina pectoris: Secondary | ICD-10-CM | POA: Diagnosis not present

## 2017-04-30 DIAGNOSIS — G8311 Monoplegia of lower limb affecting right dominant side: Secondary | ICD-10-CM | POA: Diagnosis not present

## 2017-04-30 DIAGNOSIS — I4891 Unspecified atrial fibrillation: Secondary | ICD-10-CM | POA: Diagnosis not present

## 2017-04-30 DIAGNOSIS — I69320 Aphasia following cerebral infarction: Secondary | ICD-10-CM | POA: Diagnosis not present

## 2017-04-30 DIAGNOSIS — C3490 Malignant neoplasm of unspecified part of unspecified bronchus or lung: Secondary | ICD-10-CM | POA: Diagnosis not present

## 2017-04-30 DIAGNOSIS — I5032 Chronic diastolic (congestive) heart failure: Secondary | ICD-10-CM | POA: Diagnosis not present

## 2017-05-01 DIAGNOSIS — I130 Hypertensive heart and chronic kidney disease with heart failure and stage 1 through stage 4 chronic kidney disease, or unspecified chronic kidney disease: Secondary | ICD-10-CM | POA: Diagnosis not present

## 2017-05-01 DIAGNOSIS — I69320 Aphasia following cerebral infarction: Secondary | ICD-10-CM | POA: Diagnosis not present

## 2017-05-01 DIAGNOSIS — C7951 Secondary malignant neoplasm of bone: Secondary | ICD-10-CM | POA: Diagnosis not present

## 2017-05-01 DIAGNOSIS — I4891 Unspecified atrial fibrillation: Secondary | ICD-10-CM | POA: Diagnosis not present

## 2017-05-01 DIAGNOSIS — I5032 Chronic diastolic (congestive) heart failure: Secondary | ICD-10-CM | POA: Diagnosis not present

## 2017-05-01 DIAGNOSIS — G8311 Monoplegia of lower limb affecting right dominant side: Secondary | ICD-10-CM | POA: Diagnosis not present

## 2017-05-01 DIAGNOSIS — E7800 Pure hypercholesterolemia, unspecified: Secondary | ICD-10-CM | POA: Diagnosis not present

## 2017-05-01 DIAGNOSIS — I2510 Atherosclerotic heart disease of native coronary artery without angina pectoris: Secondary | ICD-10-CM | POA: Diagnosis not present

## 2017-05-01 DIAGNOSIS — N183 Chronic kidney disease, stage 3 (moderate): Secondary | ICD-10-CM | POA: Diagnosis not present

## 2017-05-01 DIAGNOSIS — I255 Ischemic cardiomyopathy: Secondary | ICD-10-CM | POA: Diagnosis not present

## 2017-05-01 DIAGNOSIS — C3490 Malignant neoplasm of unspecified part of unspecified bronchus or lung: Secondary | ICD-10-CM | POA: Diagnosis not present

## 2017-05-01 DIAGNOSIS — D631 Anemia in chronic kidney disease: Secondary | ICD-10-CM | POA: Diagnosis not present

## 2017-05-01 DIAGNOSIS — Z466 Encounter for fitting and adjustment of urinary device: Secondary | ICD-10-CM | POA: Diagnosis not present

## 2017-05-06 DIAGNOSIS — Z466 Encounter for fitting and adjustment of urinary device: Secondary | ICD-10-CM | POA: Diagnosis not present

## 2017-05-06 DIAGNOSIS — N183 Chronic kidney disease, stage 3 (moderate): Secondary | ICD-10-CM | POA: Diagnosis not present

## 2017-05-06 DIAGNOSIS — I69320 Aphasia following cerebral infarction: Secondary | ICD-10-CM | POA: Diagnosis not present

## 2017-05-06 DIAGNOSIS — C7951 Secondary malignant neoplasm of bone: Secondary | ICD-10-CM | POA: Diagnosis not present

## 2017-05-06 DIAGNOSIS — I130 Hypertensive heart and chronic kidney disease with heart failure and stage 1 through stage 4 chronic kidney disease, or unspecified chronic kidney disease: Secondary | ICD-10-CM | POA: Diagnosis not present

## 2017-05-06 DIAGNOSIS — I255 Ischemic cardiomyopathy: Secondary | ICD-10-CM | POA: Diagnosis not present

## 2017-05-06 DIAGNOSIS — G8311 Monoplegia of lower limb affecting right dominant side: Secondary | ICD-10-CM | POA: Diagnosis not present

## 2017-05-06 DIAGNOSIS — I2510 Atherosclerotic heart disease of native coronary artery without angina pectoris: Secondary | ICD-10-CM | POA: Diagnosis not present

## 2017-05-06 DIAGNOSIS — D631 Anemia in chronic kidney disease: Secondary | ICD-10-CM | POA: Diagnosis not present

## 2017-05-06 DIAGNOSIS — I4891 Unspecified atrial fibrillation: Secondary | ICD-10-CM | POA: Diagnosis not present

## 2017-05-06 DIAGNOSIS — C3490 Malignant neoplasm of unspecified part of unspecified bronchus or lung: Secondary | ICD-10-CM | POA: Diagnosis not present

## 2017-05-06 DIAGNOSIS — I5032 Chronic diastolic (congestive) heart failure: Secondary | ICD-10-CM | POA: Diagnosis not present

## 2017-05-06 DIAGNOSIS — E7800 Pure hypercholesterolemia, unspecified: Secondary | ICD-10-CM | POA: Diagnosis not present

## 2017-05-08 DIAGNOSIS — I69320 Aphasia following cerebral infarction: Secondary | ICD-10-CM | POA: Diagnosis not present

## 2017-05-08 DIAGNOSIS — E7800 Pure hypercholesterolemia, unspecified: Secondary | ICD-10-CM | POA: Diagnosis not present

## 2017-05-08 DIAGNOSIS — I2510 Atherosclerotic heart disease of native coronary artery without angina pectoris: Secondary | ICD-10-CM | POA: Diagnosis not present

## 2017-05-08 DIAGNOSIS — G8311 Monoplegia of lower limb affecting right dominant side: Secondary | ICD-10-CM | POA: Diagnosis not present

## 2017-05-08 DIAGNOSIS — D631 Anemia in chronic kidney disease: Secondary | ICD-10-CM | POA: Diagnosis not present

## 2017-05-08 DIAGNOSIS — C7951 Secondary malignant neoplasm of bone: Secondary | ICD-10-CM | POA: Diagnosis not present

## 2017-05-08 DIAGNOSIS — I4891 Unspecified atrial fibrillation: Secondary | ICD-10-CM | POA: Diagnosis not present

## 2017-05-08 DIAGNOSIS — I130 Hypertensive heart and chronic kidney disease with heart failure and stage 1 through stage 4 chronic kidney disease, or unspecified chronic kidney disease: Secondary | ICD-10-CM | POA: Diagnosis not present

## 2017-05-08 DIAGNOSIS — I5032 Chronic diastolic (congestive) heart failure: Secondary | ICD-10-CM | POA: Diagnosis not present

## 2017-05-08 DIAGNOSIS — N183 Chronic kidney disease, stage 3 (moderate): Secondary | ICD-10-CM | POA: Diagnosis not present

## 2017-05-08 DIAGNOSIS — I255 Ischemic cardiomyopathy: Secondary | ICD-10-CM | POA: Diagnosis not present

## 2017-05-08 DIAGNOSIS — C3490 Malignant neoplasm of unspecified part of unspecified bronchus or lung: Secondary | ICD-10-CM | POA: Diagnosis not present

## 2017-05-08 DIAGNOSIS — Z466 Encounter for fitting and adjustment of urinary device: Secondary | ICD-10-CM | POA: Diagnosis not present

## 2017-05-12 ENCOUNTER — Other Ambulatory Visit: Payer: Medicare Other

## 2017-05-12 ENCOUNTER — Ambulatory Visit (HOSPITAL_COMMUNITY): Admission: RE | Admit: 2017-05-12 | Payer: Medicare Other | Source: Ambulatory Visit

## 2017-05-14 ENCOUNTER — Other Ambulatory Visit (HOSPITAL_BASED_OUTPATIENT_CLINIC_OR_DEPARTMENT_OTHER): Payer: Medicare Other

## 2017-05-14 ENCOUNTER — Ambulatory Visit (HOSPITAL_COMMUNITY)
Admission: RE | Admit: 2017-05-14 | Discharge: 2017-05-14 | Disposition: A | Discharge status: Home / Self Care | Payer: Medicare Other | Source: Ambulatory Visit | Attending: Internal Medicine | Admitting: Internal Medicine

## 2017-05-14 ENCOUNTER — Ambulatory Visit: Payer: Medicare Other

## 2017-05-14 DIAGNOSIS — D638 Anemia in other chronic diseases classified elsewhere: Secondary | ICD-10-CM | POA: Diagnosis not present

## 2017-05-14 DIAGNOSIS — M7989 Other specified soft tissue disorders: Secondary | ICD-10-CM | POA: Diagnosis not present

## 2017-05-14 DIAGNOSIS — R188 Other ascites: Secondary | ICD-10-CM | POA: Insufficient documentation

## 2017-05-14 DIAGNOSIS — C7951 Secondary malignant neoplasm of bone: Secondary | ICD-10-CM | POA: Diagnosis not present

## 2017-05-14 DIAGNOSIS — Z5112 Encounter for antineoplastic immunotherapy: Secondary | ICD-10-CM

## 2017-05-14 DIAGNOSIS — J90 Pleural effusion, not elsewhere classified: Secondary | ICD-10-CM | POA: Diagnosis not present

## 2017-05-14 DIAGNOSIS — C3411 Malignant neoplasm of upper lobe, right bronchus or lung: Secondary | ICD-10-CM | POA: Diagnosis not present

## 2017-05-14 DIAGNOSIS — Z95828 Presence of other vascular implants and grafts: Secondary | ICD-10-CM

## 2017-05-14 DIAGNOSIS — Z79899 Other long term (current) drug therapy: Secondary | ICD-10-CM | POA: Diagnosis not present

## 2017-05-14 LAB — COMPREHENSIVE METABOLIC PANEL
ALBUMIN: 3.1 g/dL — AB (ref 3.5–5.0)
ALK PHOS: 125 U/L (ref 40–150)
ALT: 8 U/L (ref 0–55)
AST: 14 U/L (ref 5–34)
Anion Gap: 13 mEq/L — ABNORMAL HIGH (ref 3–11)
BILIRUBIN TOTAL: 0.5 mg/dL (ref 0.20–1.20)
BUN: 60 mg/dL — AB (ref 7.0–26.0)
CALCIUM: 8.1 mg/dL — AB (ref 8.4–10.4)
CO2: 22 mEq/L (ref 22–29)
CREATININE: 3.9 mg/dL — AB (ref 0.6–1.1)
Chloride: 105 mEq/L (ref 98–109)
EGFR: 10 mL/min/{1.73_m2} — ABNORMAL LOW (ref >60)
Glucose: 82 mg/dl (ref 70–140)
POTASSIUM: 4.9 meq/L (ref 3.5–5.1)
Sodium: 140 mEq/L (ref 136–145)
TOTAL PROTEIN: 6.6 g/dL (ref 6.4–8.3)

## 2017-05-14 LAB — CBC WITH DIFFERENTIAL/PLATELET
BASO%: 0.7 % (ref 0.0–2.0)
Basophils Absolute: 0 10*3/uL (ref 0.0–0.1)
EOS%: 4.3 % (ref 0.0–7.0)
Eosinophils Absolute: 0.1 10*3/uL (ref 0.0–0.5)
HEMATOCRIT: 33.5 % — AB (ref 34.8–46.6)
HEMOGLOBIN: 10.4 g/dL — AB (ref 11.6–15.9)
LYMPH%: 13.9 % — ABNORMAL LOW (ref 14.0–49.7)
MCH: 28.4 pg (ref 25.1–34.0)
MCHC: 31 g/dL — ABNORMAL LOW (ref 31.5–36.0)
MCV: 91.5 fL (ref 79.5–101.0)
MONO#: 0.5 10*3/uL (ref 0.1–0.9)
MONO%: 16.4 % — ABNORMAL HIGH (ref 0.0–14.0)
NEUT#: 1.8 10*3/uL (ref 1.5–6.5)
NEUT%: 64.7 % (ref 38.4–76.8)
PLATELETS: 91 10*3/uL — AB (ref 145–400)
RBC: 3.66 10*6/uL — ABNORMAL LOW (ref 3.70–5.45)
RDW: 15.5 % — ABNORMAL HIGH (ref 11.2–14.5)
WBC: 2.8 10*3/uL — AB (ref 3.9–10.3)
lymph#: 0.4 10*3/uL — ABNORMAL LOW (ref 0.9–3.3)
nRBC: 0 % (ref 0–0)

## 2017-05-14 LAB — TSH: TSH: 12.515 m(IU)/L — ABNORMAL HIGH (ref 0.308–3.960)

## 2017-05-14 MED ORDER — SODIUM CHLORIDE 0.9% FLUSH
10.0000 mL | INTRAVENOUS | Status: DC | PRN
  Administered 2017-05-14: 10 mL via INTRAVENOUS
  Filled 2017-05-14: qty 10

## 2017-05-15 ENCOUNTER — Ambulatory Visit: Payer: Medicare Other | Admitting: Internal Medicine

## 2017-05-15 ENCOUNTER — Encounter: Payer: Self-pay | Admitting: *Deleted

## 2017-05-15 ENCOUNTER — Telehealth: Payer: Self-pay | Admitting: Internal Medicine

## 2017-05-15 ENCOUNTER — Encounter: Payer: Self-pay | Admitting: Internal Medicine

## 2017-05-15 DIAGNOSIS — N189 Chronic kidney disease, unspecified: Secondary | ICD-10-CM | POA: Diagnosis not present

## 2017-05-15 DIAGNOSIS — Z85118 Personal history of other malignant neoplasm of bronchus and lung: Secondary | ICD-10-CM

## 2017-05-15 DIAGNOSIS — C7951 Secondary malignant neoplasm of bone: Secondary | ICD-10-CM | POA: Diagnosis not present

## 2017-05-15 DIAGNOSIS — Z7189 Other specified counseling: Secondary | ICD-10-CM

## 2017-05-15 DIAGNOSIS — C3490 Malignant neoplasm of unspecified part of unspecified bronchus or lung: Secondary | ICD-10-CM

## 2017-05-15 DIAGNOSIS — Z515 Encounter for palliative care: Secondary | ICD-10-CM

## 2017-05-15 NOTE — Progress Notes (Signed)
[place] [phone]   [phone]  OFFICE PROGRESS NOTE  Celene Squibb, MD [place] [address]  PRINCIPAL DIAGNOSIS: Stage IV non-small cell lung cancer diagnosed in March 2007, with disease recurrence in June 2016 with positive PDL 1 expression (50%).   PRIOR THERAPY:  1) Status post 6 cycles of systemic chemotherapy with carboplatin and docetaxel. Last dose was given January 16, 2006.  2) Palliative radiotherapy to the large right paraspinous mass with osseous invasion centered at L5 under the care of Dr. Valere Dross. 3) Systemic chemotherapy with carboplatin for AUC of 5 and paclitaxel 175 MG/M2 every 3 weeks with Neulasta support. First dose expected on 02/02/2015. Status post 2 cycles, last dose was given 02/23/2015 discontinued secondary to intolerance and mild disease progression. 4) Ketruda 200 mg IV every 3 weeks. First dose 04/14/2015. Status post 3 cycles. Treatment was discontinued after the patient had several hospitalization with recurrent infection and back abscess but were unrelated to her treatment. 5) Retreatment with Ketruda 200 mg IV every 3 weeks status post 6 cycles.  CURRENT THERAPY: Observation.  INTERVAL HISTORY: [name] 83 y.o. female returns to the clinic today for follow-up visit accompanied by her daughter.  The patient is feeling fine today with no specific complaints except for pain in the right leg.  He denied having any significant chest pain, shortness of breath, cough or hemoptysis.  She denied having any fever or chills.  She has no nausea, vomiting, diarrhea or constipation.  The patient is currently on observation and she is feeling well.  She had repeat CT scan of the chest, abdomen and pelvis performed recently and she is here for evaluation and discussion of her scan results.  MEDICAL HISTORY: Past Medical History:  Diagnosis Date  . A-fib ([place])   . Chronic anemia   . Chronic  diastolic (congestive) heart failure ([place])   . Chronic fatigue 04/04/2015  . Chronic fatigue 04/04/2015  . CKD (chronic kidney disease)   . Coronary artery disease    a.  LHC (06/04/05): LHC done in [place] with high grade RCA => s/p BMS to RCA;  b.  Nuclear (09/14/09): Lexiscan; Inf infarct with mild peri-infarct ishemia, EF 52%; Low Risk.  Marland Kitchen DVT (deep venous thrombosis) ([place])   . Foot drop, right   . GERD (gastroesophageal reflux disease)   . Goals of care, counseling/discussion 09/30/2016  . Hypercholesterolemia   . Hypertension   . Ischemic cardiomyopathy    a. Echo (07/26/13): Mild LVH, EF 35-40%, diff HK, inf AK, Gr 2 DD, Tr AI, mildly dilated Ao root, MAC, mild MR, mild LAE, mod reduced RVSF.  . Non-small cell carcinoma of lung ([place])    Stage IV;spinal cancer; had chemo and radiation and immune therapy  . Stroke [facility])     ALLERGIES:  is allergic to amlodipine; ciprofloxacin; mirtazapine; statins; aspirin; cephalexin; hydralazine; iron; ambien [zolpidem tartrate]; eliquis [apixaban]; lorazepam; sulfonamide derivatives; xarelto [rivaroxaban]; zolpidem; latex; penicillins; shellfish allergy; and tape.  MEDICATIONS:  Current Outpatient Medications  Medication Sig Dispense Refill  . acetaminophen (TYLENOL) 500 MG tablet Take 500 mg by mouth daily as needed for mild pain.    . diazepam (VALIUM) 2 MG tablet Take 1 tablet (2 mg total) by mouth See admin instructions. Take 1 tab 1 hour prior to MRI. May repeat X1 after 1 hour if not effective. 2 tablet 0  . diphenoxylate-atropine (LOMOTIL) 2.5-0.025 MG tablet Take 1  tablet by mouth 4 (four) times daily as needed for diarrhea or loose stools. Reported on 11/28/2015    . ELIQUIS 2.5 MG TABS tablet TAKE (1) TABLET BY MOUTH TWICE DAILY. 60 tablet 0  . furosemide (LASIX) 40 MG tablet Take 40 mg by mouth daily as needed for fluid or edema.     Marland Kitchen loperamide (IMODIUM) 2 MG capsule Take 1 capsule (2 mg total) by mouth every 6 (six) hours as  needed for diarrhea or loose stools. 30 capsule 0  . metoprolol tartrate (LOPRESSOR) 25 MG tablet Take 0.5 tablets (12.5 mg total) by mouth at bedtime. 30 tablet 0  . pantoprazole (PROTONIX) 40 MG tablet Take 40 mg by mouth daily before breakfast.     . Pembrolizumab (KEYTRUDA IV) Inject into the vein every 21 ( twenty-one) days.    Marland Kitchen senna-docusate (SENOKOT-S) 8.6-50 MG tablet Take 1 tablet by mouth at bedtime as needed for mild constipation. 30 tablet 0   No current facility-administered medications for this visit.    Facility-Administered Medications Ordered in Other Visits  Medication Dose Route Frequency Provider Last Rate Last Dose  . heparin lock flush 100 unit/mL  500 Units Intracatheter Once PRN Curt Bears, MD      . sodium chloride flush (NS) 0.9 % injection 10 mL  10 mL Intracatheter PRN Curt Bears, MD      . sodium chloride flush (NS) 0.9 % injection 10 mL  10 mL Intravenous PRN Curt Bears, MD   10 mL at 01/20/17 1228    SURGICAL HISTORY:  Past Surgical History:  Procedure Laterality Date  . ABDOMINAL HYSTERECTOMY    . APPENDECTOMY    . CATARACT EXTRACTION Bilateral   . CHOLECYSTECTOMY  2011  . CYSTOSCOPY     with stent placement  . CYSTOSCOPY W/ URETERAL STENT PLACEMENT Right 12/12/2015   Procedure: CYSTOSCOPY WITH RETROGRADE PYELOGRAM WITH STENT EXCHANGE;  Surgeon: Franchot Gallo, MD;  Location: [facility];  Service: Urology;  Laterality: Right;  . ERCP  2011   with stone extraction, done same day as cholecystectomy  . HEMATOMA EVACUATION  December 2006   groin  . HEMORROIDECTOMY    . LAMINECTOMY    . PORTACATH PLACEMENT    . TONSILLECTOMY      REVIEW OF SYSTEMS:  A comprehensive review of systems was negative except for: Constitutional: positive for fatigue Musculoskeletal: positive for back pain   PHYSICAL EXAMINATION: General appearance: alert, cooperative, fatigued and no distress Head: Normocephalic, without obvious abnormality,  atraumatic Neck: no adenopathy Lymph nodes: Cervical, supraclavicular, and axillary nodes normal. Resp: clear to auscultation bilaterally Back: symmetric, no curvature. ROM normal. No CVA tenderness. Cardio: regular rate and rhythm, S1, S2 normal, no murmur, click, rub or gallop GI: soft, non-tender; bowel sounds normal; no masses,  no organomegaly Extremities: extremities normal, atraumatic, no cyanosis or edema  ECOG PERFORMANCE STATUS: 2 - Symptomatic, <50% confined to bed  Blood pressure (!) 111/57, pulse (!) 55, temperature 97.9 F (36.6 C), temperature source Oral, resp. rate 18, weight 132 lb 6.4 oz (60.1 kg), SpO2 95 %.  LABORATORY DATA: Lab Results  Component Value Date   WBC 2.8 (L) 05/14/2017   HGB 10.4 (L) 05/14/2017   HCT 33.5 (L) 05/14/2017   MCV 91.5 05/14/2017   PLT 91 (L) 05/14/2017      Chemistry      Component Value Date/Time   NA 140 05/14/2017 1332   K 4.9 05/14/2017 1332   CL 113 (H) 11/03/2016 0426  CL 104 06/19/2012 0958   CO2 22 05/14/2017 1332   BUN 60.0 (H) 05/14/2017 1332   CREATININE 3.9 (HH) 05/14/2017 1332      Component Value Date/Time   CALCIUM 8.1 (L) 05/14/2017 1332   ALKPHOS 125 05/14/2017 1332   AST 14 05/14/2017 1332   ALT 8 05/14/2017 1332   BILITOT 0.50 05/14/2017 1332       RADIOGRAPHIC STUDIES: Ct Abdomen Pelvis Wo Contrast  Result Date: 05/14/2017 CLINICAL DATA:  Metastatic non-small-cell (squamous) lung cancer, progressive after first line/ maintenance therapy. Re-evaluation post subsequent therapy. EXAM: CT CHEST, ABDOMEN AND PELVIS WITHOUT CONTRAST TECHNIQUE: Multidetector CT imaging of the chest, abdomen and pelvis was performed following the standard protocol without IV contrast. COMPARISON:  CT 02/07/2017 and 12/06/2016. FINDINGS: CT CHEST FINDINGS Cardiovascular: Extensive atherosclerosis of the aorta, great vessels and coronary arteries again noted. No acute vascular findings on noncontrast imaging. Right IJ  Port-A-Cath extends to the lower SVC. Probable aortic valvular calcifications. The heart size is normal. There is no pericardial effusion. Mediastinum/Nodes: There are no enlarged mediastinal, hilar or axillary lymph nodes. Hilar assessment is limited by the lack of intravenous contrast, although the hilar contours appear unchanged. Stable mild thyroid nodularity. Lungs/Pleura: Stable moderate to large right and small left pleural effusions. No pleural nodularity identified. There is stable emphysema and subtotal collapse of the right lower lobe. High density within the collapsed right lower and middle lobes suggests chronic aspiration. There are stable small right upper lobe nodules on images 25 and 85 (5 mm for the latter). There is a new lobulated right upper lobe nodule measuring 9 x 8 mm on image 59. Spiculated left upper lobe lesion measuring 10 x 6 mm is stable. There is stable thickening along the left major fissure. Musculoskeletal/Chest wall: No chest wall mass or suspicious osseous findings. Stable chronic superior endplate compression deformity at T11. CT ABDOMEN AND PELVIS FINDINGS Hepatobiliary: The liver appears unremarkable as imaged in the noncontrast state. No significant biliary dilatation status post cholecystectomy. Pancreas: Diffusely atrophied without focal abnormality or surrounding inflammation. Spleen: Normal in size without focal abnormality. Stable small splenules. Adrenals/Urinary Tract: The adrenal glands appear stable. Both kidneys demonstrate chronic cortical thinning and atrophy. There are stable exophytic low-density and intermediate density right renal lesions. There is a double-J right ureteral stent with encrustation of the infrarenal portion of the stent. No hydronephrosis. The bladder is decompressed by a Foley catheter. Stomach/Bowel: No evidence of bowel wall thickening, distention or surrounding inflammatory change. Mild sigmoid diverticulosis. Vascular/Lymphatic: There are  no enlarged abdominal or pelvic lymph nodes. Diffuse aortic and branch vessel atherosclerosis. Reproductive: Hysterectomy.  No adnexal mass.  Pelvic floor laxity. Other: Mildly increased edema throughout the subcutaneous fat of the abdomen, pelvis and proximal thighs. Small amount of pelvic ascites without peritoneal nodularity. Musculoskeletal: No acute or new osseous lesions demonstrated. There are chronic lytic lesions in right aspects of the L4 and L5 vertebral bodies and the right hemi sacrum. There is a chronic compression deformity of the L5 vertebral body. Probable chronic femoral head avascular necrosis without subchondral collapse. IMPRESSION: 1. New 8 x 9 mm right upper lobe nodule from prior study of 3 months ago, suggesting focal inflammation. New malignancy and metastatic disease less likely. Attention on follow-up recommended. 2. Additional pulmonary nodules bilaterally are unchanged. 3. Stable right-greater-than-left pleural effusions with resulting right middle and lower lobe collapse. 4. No evidence of progressive metastatic disease in the abdomen or pelvis. Stable chronic lytic lesions in the  lower lumbar spine and sacrum. 5. Encrustation of the intrarenal portion of the right ureteral stent. No hydronephrosis. 6. Increased soft tissue edema in the abdomen and pelvis and mild ascites consistent with anasarca. 7. Stable additional incidental findings including extensive atherosclerosis, bilateral renal cortical thinning, exophytic right renal lesions and pelvic floor laxity. Electronically Signed   By: Richardean Sale M.D.   On: 05/14/2017 17:13   Ct Chest Wo Contrast  Result Date: 05/14/2017 CLINICAL DATA:  Metastatic non-small-cell (squamous) lung cancer, progressive after first line/ maintenance therapy. Re-evaluation post subsequent therapy. EXAM: CT CHEST, ABDOMEN AND PELVIS WITHOUT CONTRAST TECHNIQUE: Multidetector CT imaging of the chest, abdomen and pelvis was performed following the  standard protocol without IV contrast. COMPARISON:  CT 02/07/2017 and 12/06/2016. FINDINGS: CT CHEST FINDINGS Cardiovascular: Extensive atherosclerosis of the aorta, great vessels and coronary arteries again noted. No acute vascular findings on noncontrast imaging. Right IJ Port-A-Cath extends to the lower SVC. Probable aortic valvular calcifications. The heart size is normal. There is no pericardial effusion. Mediastinum/Nodes: There are no enlarged mediastinal, hilar or axillary lymph nodes. Hilar assessment is limited by the lack of intravenous contrast, although the hilar contours appear unchanged. Stable mild thyroid nodularity. Lungs/Pleura: Stable moderate to large right and small left pleural effusions. No pleural nodularity identified. There is stable emphysema and subtotal collapse of the right lower lobe. High density within the collapsed right lower and middle lobes suggests chronic aspiration. There are stable small right upper lobe nodules on images 25 and 85 (5 mm for the latter). There is a new lobulated right upper lobe nodule measuring 9 x 8 mm on image 59. Spiculated left upper lobe lesion measuring 10 x 6 mm is stable. There is stable thickening along the left major fissure. Musculoskeletal/Chest wall: No chest wall mass or suspicious osseous findings. Stable chronic superior endplate compression deformity at T11. CT ABDOMEN AND PELVIS FINDINGS Hepatobiliary: The liver appears unremarkable as imaged in the noncontrast state. No significant biliary dilatation status post cholecystectomy. Pancreas: Diffusely atrophied without focal abnormality or surrounding inflammation. Spleen: Normal in size without focal abnormality. Stable small splenules. Adrenals/Urinary Tract: The adrenal glands appear stable. Both kidneys demonstrate chronic cortical thinning and atrophy. There are stable exophytic low-density and intermediate density right renal lesions. There is a double-J right ureteral stent with  encrustation of the infrarenal portion of the stent. No hydronephrosis. The bladder is decompressed by a Foley catheter. Stomach/Bowel: No evidence of bowel wall thickening, distention or surrounding inflammatory change. Mild sigmoid diverticulosis. Vascular/Lymphatic: There are no enlarged abdominal or pelvic lymph nodes. Diffuse aortic and branch vessel atherosclerosis. Reproductive: Hysterectomy.  No adnexal mass.  Pelvic floor laxity. Other: Mildly increased edema throughout the subcutaneous fat of the abdomen, pelvis and proximal thighs. Small amount of pelvic ascites without peritoneal nodularity. Musculoskeletal: No acute or new osseous lesions demonstrated. There are chronic lytic lesions in right aspects of the L4 and L5 vertebral bodies and the right hemi sacrum. There is a chronic compression deformity of the L5 vertebral body. Probable chronic femoral head avascular necrosis without subchondral collapse. IMPRESSION: 1. New 8 x 9 mm right upper lobe nodule from prior study of 3 months ago, suggesting focal inflammation. New malignancy and metastatic disease less likely. Attention on follow-up recommended. 2. Additional pulmonary nodules bilaterally are unchanged. 3. Stable right-greater-than-left pleural effusions with resulting right middle and lower lobe collapse. 4. No evidence of progressive metastatic disease in the abdomen or pelvis. Stable chronic lytic lesions in the lower lumbar  spine and sacrum. 5. Encrustation of the intrarenal portion of the right ureteral stent. No hydronephrosis. 6. Increased soft tissue edema in the abdomen and pelvis and mild ascites consistent with anasarca. 7. Stable additional incidental findings including extensive atherosclerosis, bilateral renal cortical thinning, exophytic right renal lesions and pelvic floor laxity. Electronically Signed   By: Richardean Sale M.D.   On: 05/14/2017 17:13    ASSESSMENT AND PLAN:  This is a very pleasant 83 years old white female  with a stage IV non-small cell lung cancer initially diagnosed in March 2007 status post systemic chemotherapy with carboplatin and docetaxel for 6 cycles and was on observation for 9 years. She developed disease progression with metastatic bone disease in the lower thoracic and lumbar spines. The patient was treated with palliative radiotherapy followed by 2 cycles of systemic chemotherapy with carboplatin and paclitaxel discontinued secondary to intolerance. She underwent treatment again with immunotherapy with [place] status post 6 cycles  She tolerated the treatment fairly well but she requested break off treatment and she has been in observation for the last few months. Her recent CT scan of the chest showed no concerning findings for disease progression except for new right upper lobe pulmonary nodule suspicious to be inflammatory, new malignancy is less likely but could not be excluded. I discussed the scan results with the patient and her daughter.  I recommended for her to continue on observation with repeat CT scan of the chest, abdomen and pelvis in 3 months for restaging of her disease. She was advised to call immediately if she has any concerning symptoms in the interval. All questions were answered. The patient knows to call the clinic with any problems, questions or concerns. We can certainly see the patient much sooner if necessary.  Disclaimer: This note was dictated with voice recognition software. Similar sounding words can inadvertently be transcribed and may not be corrected upon review.

## 2017-05-15 NOTE — Telephone Encounter (Signed)
Scheduled appt per 12/13 los - Gave patient AVS and calender per los. Central radiology to contact patient with ct schedule.

## 2017-05-16 DIAGNOSIS — N183 Chronic kidney disease, stage 3 (moderate): Secondary | ICD-10-CM | POA: Diagnosis not present

## 2017-05-16 DIAGNOSIS — I69320 Aphasia following cerebral infarction: Secondary | ICD-10-CM | POA: Diagnosis not present

## 2017-05-16 DIAGNOSIS — G8311 Monoplegia of lower limb affecting right dominant side: Secondary | ICD-10-CM | POA: Diagnosis not present

## 2017-05-16 DIAGNOSIS — E7800 Pure hypercholesterolemia, unspecified: Secondary | ICD-10-CM | POA: Diagnosis not present

## 2017-05-16 DIAGNOSIS — I255 Ischemic cardiomyopathy: Secondary | ICD-10-CM | POA: Diagnosis not present

## 2017-05-16 DIAGNOSIS — C7951 Secondary malignant neoplasm of bone: Secondary | ICD-10-CM | POA: Diagnosis not present

## 2017-05-16 DIAGNOSIS — C3490 Malignant neoplasm of unspecified part of unspecified bronchus or lung: Secondary | ICD-10-CM | POA: Diagnosis not present

## 2017-05-16 DIAGNOSIS — I4891 Unspecified atrial fibrillation: Secondary | ICD-10-CM | POA: Diagnosis not present

## 2017-05-16 DIAGNOSIS — D631 Anemia in chronic kidney disease: Secondary | ICD-10-CM | POA: Diagnosis not present

## 2017-05-16 DIAGNOSIS — Z466 Encounter for fitting and adjustment of urinary device: Secondary | ICD-10-CM | POA: Diagnosis not present

## 2017-05-16 DIAGNOSIS — I5032 Chronic diastolic (congestive) heart failure: Secondary | ICD-10-CM | POA: Diagnosis not present

## 2017-05-16 DIAGNOSIS — I130 Hypertensive heart and chronic kidney disease with heart failure and stage 1 through stage 4 chronic kidney disease, or unspecified chronic kidney disease: Secondary | ICD-10-CM | POA: Diagnosis not present

## 2017-05-16 DIAGNOSIS — I2510 Atherosclerotic heart disease of native coronary artery without angina pectoris: Secondary | ICD-10-CM | POA: Diagnosis not present

## 2017-05-17 DIAGNOSIS — Z466 Encounter for fitting and adjustment of urinary device: Secondary | ICD-10-CM | POA: Diagnosis not present

## 2017-05-17 DIAGNOSIS — I2510 Atherosclerotic heart disease of native coronary artery without angina pectoris: Secondary | ICD-10-CM | POA: Diagnosis not present

## 2017-05-17 DIAGNOSIS — I4891 Unspecified atrial fibrillation: Secondary | ICD-10-CM | POA: Diagnosis not present

## 2017-05-17 DIAGNOSIS — C3490 Malignant neoplasm of unspecified part of unspecified bronchus or lung: Secondary | ICD-10-CM | POA: Diagnosis not present

## 2017-05-17 DIAGNOSIS — I5032 Chronic diastolic (congestive) heart failure: Secondary | ICD-10-CM | POA: Diagnosis not present

## 2017-05-17 DIAGNOSIS — I130 Hypertensive heart and chronic kidney disease with heart failure and stage 1 through stage 4 chronic kidney disease, or unspecified chronic kidney disease: Secondary | ICD-10-CM | POA: Diagnosis not present

## 2017-05-17 DIAGNOSIS — D631 Anemia in chronic kidney disease: Secondary | ICD-10-CM | POA: Diagnosis not present

## 2017-05-17 DIAGNOSIS — I255 Ischemic cardiomyopathy: Secondary | ICD-10-CM | POA: Diagnosis not present

## 2017-05-17 DIAGNOSIS — N183 Chronic kidney disease, stage 3 (moderate): Secondary | ICD-10-CM | POA: Diagnosis not present

## 2017-05-17 DIAGNOSIS — C7951 Secondary malignant neoplasm of bone: Secondary | ICD-10-CM | POA: Diagnosis not present

## 2017-05-17 DIAGNOSIS — I69320 Aphasia following cerebral infarction: Secondary | ICD-10-CM | POA: Diagnosis not present

## 2017-05-17 DIAGNOSIS — G8311 Monoplegia of lower limb affecting right dominant side: Secondary | ICD-10-CM | POA: Diagnosis not present

## 2017-05-17 DIAGNOSIS — E7800 Pure hypercholesterolemia, unspecified: Secondary | ICD-10-CM | POA: Diagnosis not present

## 2017-05-21 ENCOUNTER — Ambulatory Visit: Payer: Medicare Other | Admitting: Urology

## 2017-05-21 DIAGNOSIS — C7951 Secondary malignant neoplasm of bone: Secondary | ICD-10-CM | POA: Diagnosis not present

## 2017-05-21 DIAGNOSIS — N1339 Other hydronephrosis: Secondary | ICD-10-CM

## 2017-05-21 DIAGNOSIS — Z466 Encounter for fitting and adjustment of urinary device: Secondary | ICD-10-CM | POA: Diagnosis not present

## 2017-05-21 DIAGNOSIS — I4891 Unspecified atrial fibrillation: Secondary | ICD-10-CM | POA: Diagnosis not present

## 2017-05-21 DIAGNOSIS — C3490 Malignant neoplasm of unspecified part of unspecified bronchus or lung: Secondary | ICD-10-CM | POA: Diagnosis not present

## 2017-05-21 DIAGNOSIS — D631 Anemia in chronic kidney disease: Secondary | ICD-10-CM | POA: Diagnosis not present

## 2017-05-21 DIAGNOSIS — E7800 Pure hypercholesterolemia, unspecified: Secondary | ICD-10-CM | POA: Diagnosis not present

## 2017-05-21 DIAGNOSIS — G8311 Monoplegia of lower limb affecting right dominant side: Secondary | ICD-10-CM | POA: Diagnosis not present

## 2017-05-21 DIAGNOSIS — I2510 Atherosclerotic heart disease of native coronary artery without angina pectoris: Secondary | ICD-10-CM | POA: Diagnosis not present

## 2017-05-21 DIAGNOSIS — N3941 Urge incontinence: Secondary | ICD-10-CM | POA: Diagnosis not present

## 2017-05-21 DIAGNOSIS — I255 Ischemic cardiomyopathy: Secondary | ICD-10-CM | POA: Diagnosis not present

## 2017-05-21 DIAGNOSIS — N183 Chronic kidney disease, stage 3 (moderate): Secondary | ICD-10-CM | POA: Diagnosis not present

## 2017-05-21 DIAGNOSIS — I5032 Chronic diastolic (congestive) heart failure: Secondary | ICD-10-CM | POA: Diagnosis not present

## 2017-05-21 DIAGNOSIS — I130 Hypertensive heart and chronic kidney disease with heart failure and stage 1 through stage 4 chronic kidney disease, or unspecified chronic kidney disease: Secondary | ICD-10-CM | POA: Diagnosis not present

## 2017-05-21 DIAGNOSIS — I69320 Aphasia following cerebral infarction: Secondary | ICD-10-CM | POA: Diagnosis not present

## 2017-05-22 DIAGNOSIS — I4891 Unspecified atrial fibrillation: Secondary | ICD-10-CM | POA: Diagnosis not present

## 2017-05-22 DIAGNOSIS — N183 Chronic kidney disease, stage 3 (moderate): Secondary | ICD-10-CM | POA: Diagnosis not present

## 2017-05-22 DIAGNOSIS — D631 Anemia in chronic kidney disease: Secondary | ICD-10-CM | POA: Diagnosis not present

## 2017-05-22 DIAGNOSIS — C7951 Secondary malignant neoplasm of bone: Secondary | ICD-10-CM | POA: Diagnosis not present

## 2017-05-22 DIAGNOSIS — G8311 Monoplegia of lower limb affecting right dominant side: Secondary | ICD-10-CM | POA: Diagnosis not present

## 2017-05-22 DIAGNOSIS — C3490 Malignant neoplasm of unspecified part of unspecified bronchus or lung: Secondary | ICD-10-CM | POA: Diagnosis not present

## 2017-05-22 DIAGNOSIS — I130 Hypertensive heart and chronic kidney disease with heart failure and stage 1 through stage 4 chronic kidney disease, or unspecified chronic kidney disease: Secondary | ICD-10-CM | POA: Diagnosis not present

## 2017-05-22 DIAGNOSIS — Z466 Encounter for fitting and adjustment of urinary device: Secondary | ICD-10-CM | POA: Diagnosis not present

## 2017-05-22 DIAGNOSIS — I2510 Atherosclerotic heart disease of native coronary artery without angina pectoris: Secondary | ICD-10-CM | POA: Diagnosis not present

## 2017-05-22 DIAGNOSIS — I69320 Aphasia following cerebral infarction: Secondary | ICD-10-CM | POA: Diagnosis not present

## 2017-05-22 DIAGNOSIS — I255 Ischemic cardiomyopathy: Secondary | ICD-10-CM | POA: Diagnosis not present

## 2017-05-22 DIAGNOSIS — I5032 Chronic diastolic (congestive) heart failure: Secondary | ICD-10-CM | POA: Diagnosis not present

## 2017-05-22 DIAGNOSIS — E7800 Pure hypercholesterolemia, unspecified: Secondary | ICD-10-CM | POA: Diagnosis not present

## 2017-05-23 DIAGNOSIS — E7800 Pure hypercholesterolemia, unspecified: Secondary | ICD-10-CM | POA: Diagnosis not present

## 2017-05-23 DIAGNOSIS — D631 Anemia in chronic kidney disease: Secondary | ICD-10-CM | POA: Diagnosis not present

## 2017-05-23 DIAGNOSIS — I130 Hypertensive heart and chronic kidney disease with heart failure and stage 1 through stage 4 chronic kidney disease, or unspecified chronic kidney disease: Secondary | ICD-10-CM | POA: Diagnosis not present

## 2017-05-23 DIAGNOSIS — I255 Ischemic cardiomyopathy: Secondary | ICD-10-CM | POA: Diagnosis not present

## 2017-05-23 DIAGNOSIS — G8311 Monoplegia of lower limb affecting right dominant side: Secondary | ICD-10-CM | POA: Diagnosis not present

## 2017-05-23 DIAGNOSIS — I69320 Aphasia following cerebral infarction: Secondary | ICD-10-CM | POA: Diagnosis not present

## 2017-05-23 DIAGNOSIS — I2510 Atherosclerotic heart disease of native coronary artery without angina pectoris: Secondary | ICD-10-CM | POA: Diagnosis not present

## 2017-05-23 DIAGNOSIS — C3490 Malignant neoplasm of unspecified part of unspecified bronchus or lung: Secondary | ICD-10-CM | POA: Diagnosis not present

## 2017-05-23 DIAGNOSIS — Z466 Encounter for fitting and adjustment of urinary device: Secondary | ICD-10-CM | POA: Diagnosis not present

## 2017-05-23 DIAGNOSIS — I4891 Unspecified atrial fibrillation: Secondary | ICD-10-CM | POA: Diagnosis not present

## 2017-05-23 DIAGNOSIS — I5032 Chronic diastolic (congestive) heart failure: Secondary | ICD-10-CM | POA: Diagnosis not present

## 2017-05-23 DIAGNOSIS — N183 Chronic kidney disease, stage 3 (moderate): Secondary | ICD-10-CM | POA: Diagnosis not present

## 2017-05-23 DIAGNOSIS — C7951 Secondary malignant neoplasm of bone: Secondary | ICD-10-CM | POA: Diagnosis not present

## 2017-05-24 DIAGNOSIS — I5032 Chronic diastolic (congestive) heart failure: Secondary | ICD-10-CM | POA: Diagnosis not present

## 2017-05-24 DIAGNOSIS — I69320 Aphasia following cerebral infarction: Secondary | ICD-10-CM | POA: Diagnosis not present

## 2017-05-24 DIAGNOSIS — I2510 Atherosclerotic heart disease of native coronary artery without angina pectoris: Secondary | ICD-10-CM | POA: Diagnosis not present

## 2017-05-24 DIAGNOSIS — I4891 Unspecified atrial fibrillation: Secondary | ICD-10-CM | POA: Diagnosis not present

## 2017-05-24 DIAGNOSIS — G8311 Monoplegia of lower limb affecting right dominant side: Secondary | ICD-10-CM | POA: Diagnosis not present

## 2017-05-24 DIAGNOSIS — E7800 Pure hypercholesterolemia, unspecified: Secondary | ICD-10-CM | POA: Diagnosis not present

## 2017-05-24 DIAGNOSIS — I130 Hypertensive heart and chronic kidney disease with heart failure and stage 1 through stage 4 chronic kidney disease, or unspecified chronic kidney disease: Secondary | ICD-10-CM | POA: Diagnosis not present

## 2017-05-24 DIAGNOSIS — C7951 Secondary malignant neoplasm of bone: Secondary | ICD-10-CM | POA: Diagnosis not present

## 2017-05-24 DIAGNOSIS — I255 Ischemic cardiomyopathy: Secondary | ICD-10-CM | POA: Diagnosis not present

## 2017-05-24 DIAGNOSIS — Z466 Encounter for fitting and adjustment of urinary device: Secondary | ICD-10-CM | POA: Diagnosis not present

## 2017-05-24 DIAGNOSIS — N183 Chronic kidney disease, stage 3 (moderate): Secondary | ICD-10-CM | POA: Diagnosis not present

## 2017-05-24 DIAGNOSIS — D631 Anemia in chronic kidney disease: Secondary | ICD-10-CM | POA: Diagnosis not present

## 2017-05-24 DIAGNOSIS — C3490 Malignant neoplasm of unspecified part of unspecified bronchus or lung: Secondary | ICD-10-CM | POA: Diagnosis not present

## 2017-05-28 DIAGNOSIS — I4891 Unspecified atrial fibrillation: Secondary | ICD-10-CM | POA: Diagnosis not present

## 2017-05-28 DIAGNOSIS — I255 Ischemic cardiomyopathy: Secondary | ICD-10-CM | POA: Diagnosis not present

## 2017-05-28 DIAGNOSIS — N183 Chronic kidney disease, stage 3 (moderate): Secondary | ICD-10-CM | POA: Diagnosis not present

## 2017-05-28 DIAGNOSIS — G8311 Monoplegia of lower limb affecting right dominant side: Secondary | ICD-10-CM | POA: Diagnosis not present

## 2017-05-28 DIAGNOSIS — E7800 Pure hypercholesterolemia, unspecified: Secondary | ICD-10-CM | POA: Diagnosis not present

## 2017-05-28 DIAGNOSIS — I130 Hypertensive heart and chronic kidney disease with heart failure and stage 1 through stage 4 chronic kidney disease, or unspecified chronic kidney disease: Secondary | ICD-10-CM | POA: Diagnosis not present

## 2017-05-28 DIAGNOSIS — I5032 Chronic diastolic (congestive) heart failure: Secondary | ICD-10-CM | POA: Diagnosis not present

## 2017-05-28 DIAGNOSIS — I2510 Atherosclerotic heart disease of native coronary artery without angina pectoris: Secondary | ICD-10-CM | POA: Diagnosis not present

## 2017-05-28 DIAGNOSIS — I69320 Aphasia following cerebral infarction: Secondary | ICD-10-CM | POA: Diagnosis not present

## 2017-05-28 DIAGNOSIS — Z466 Encounter for fitting and adjustment of urinary device: Secondary | ICD-10-CM | POA: Diagnosis not present

## 2017-05-28 DIAGNOSIS — C3490 Malignant neoplasm of unspecified part of unspecified bronchus or lung: Secondary | ICD-10-CM | POA: Diagnosis not present

## 2017-05-28 DIAGNOSIS — D631 Anemia in chronic kidney disease: Secondary | ICD-10-CM | POA: Diagnosis not present

## 2017-05-28 DIAGNOSIS — C7951 Secondary malignant neoplasm of bone: Secondary | ICD-10-CM | POA: Diagnosis not present

## 2017-05-29 DIAGNOSIS — I2510 Atherosclerotic heart disease of native coronary artery without angina pectoris: Secondary | ICD-10-CM | POA: Diagnosis not present

## 2017-05-29 DIAGNOSIS — D631 Anemia in chronic kidney disease: Secondary | ICD-10-CM | POA: Diagnosis not present

## 2017-05-29 DIAGNOSIS — I255 Ischemic cardiomyopathy: Secondary | ICD-10-CM | POA: Diagnosis not present

## 2017-05-29 DIAGNOSIS — C7951 Secondary malignant neoplasm of bone: Secondary | ICD-10-CM | POA: Diagnosis not present

## 2017-05-29 DIAGNOSIS — I5032 Chronic diastolic (congestive) heart failure: Secondary | ICD-10-CM | POA: Diagnosis not present

## 2017-05-29 DIAGNOSIS — I130 Hypertensive heart and chronic kidney disease with heart failure and stage 1 through stage 4 chronic kidney disease, or unspecified chronic kidney disease: Secondary | ICD-10-CM | POA: Diagnosis not present

## 2017-05-29 DIAGNOSIS — E7800 Pure hypercholesterolemia, unspecified: Secondary | ICD-10-CM | POA: Diagnosis not present

## 2017-05-29 DIAGNOSIS — C3490 Malignant neoplasm of unspecified part of unspecified bronchus or lung: Secondary | ICD-10-CM | POA: Diagnosis not present

## 2017-05-29 DIAGNOSIS — G8311 Monoplegia of lower limb affecting right dominant side: Secondary | ICD-10-CM | POA: Diagnosis not present

## 2017-05-29 DIAGNOSIS — Z466 Encounter for fitting and adjustment of urinary device: Secondary | ICD-10-CM | POA: Diagnosis not present

## 2017-05-29 DIAGNOSIS — I4891 Unspecified atrial fibrillation: Secondary | ICD-10-CM | POA: Diagnosis not present

## 2017-05-29 DIAGNOSIS — N183 Chronic kidney disease, stage 3 (moderate): Secondary | ICD-10-CM | POA: Diagnosis not present

## 2017-05-29 DIAGNOSIS — I69320 Aphasia following cerebral infarction: Secondary | ICD-10-CM | POA: Diagnosis not present

## 2017-05-30 DIAGNOSIS — C7951 Secondary malignant neoplasm of bone: Secondary | ICD-10-CM | POA: Diagnosis not present

## 2017-05-30 DIAGNOSIS — I5032 Chronic diastolic (congestive) heart failure: Secondary | ICD-10-CM | POA: Diagnosis not present

## 2017-05-30 DIAGNOSIS — I130 Hypertensive heart and chronic kidney disease with heart failure and stage 1 through stage 4 chronic kidney disease, or unspecified chronic kidney disease: Secondary | ICD-10-CM | POA: Diagnosis not present

## 2017-05-30 DIAGNOSIS — I255 Ischemic cardiomyopathy: Secondary | ICD-10-CM | POA: Diagnosis not present

## 2017-05-30 DIAGNOSIS — E7800 Pure hypercholesterolemia, unspecified: Secondary | ICD-10-CM | POA: Diagnosis not present

## 2017-05-30 DIAGNOSIS — I2510 Atherosclerotic heart disease of native coronary artery without angina pectoris: Secondary | ICD-10-CM | POA: Diagnosis not present

## 2017-05-30 DIAGNOSIS — I69320 Aphasia following cerebral infarction: Secondary | ICD-10-CM | POA: Diagnosis not present

## 2017-05-30 DIAGNOSIS — I4891 Unspecified atrial fibrillation: Secondary | ICD-10-CM | POA: Diagnosis not present

## 2017-05-30 DIAGNOSIS — C3490 Malignant neoplasm of unspecified part of unspecified bronchus or lung: Secondary | ICD-10-CM | POA: Diagnosis not present

## 2017-05-30 DIAGNOSIS — N183 Chronic kidney disease, stage 3 (moderate): Secondary | ICD-10-CM | POA: Diagnosis not present

## 2017-05-30 DIAGNOSIS — Z466 Encounter for fitting and adjustment of urinary device: Secondary | ICD-10-CM | POA: Diagnosis not present

## 2017-05-30 DIAGNOSIS — D631 Anemia in chronic kidney disease: Secondary | ICD-10-CM | POA: Diagnosis not present

## 2017-05-30 DIAGNOSIS — G8311 Monoplegia of lower limb affecting right dominant side: Secondary | ICD-10-CM | POA: Diagnosis not present

## 2017-06-02 DIAGNOSIS — N183 Chronic kidney disease, stage 3 (moderate): Secondary | ICD-10-CM | POA: Diagnosis not present

## 2017-06-02 DIAGNOSIS — E7800 Pure hypercholesterolemia, unspecified: Secondary | ICD-10-CM | POA: Diagnosis not present

## 2017-06-02 DIAGNOSIS — Z466 Encounter for fitting and adjustment of urinary device: Secondary | ICD-10-CM | POA: Diagnosis not present

## 2017-06-02 DIAGNOSIS — I255 Ischemic cardiomyopathy: Secondary | ICD-10-CM | POA: Diagnosis not present

## 2017-06-02 DIAGNOSIS — C3490 Malignant neoplasm of unspecified part of unspecified bronchus or lung: Secondary | ICD-10-CM | POA: Diagnosis not present

## 2017-06-02 DIAGNOSIS — D631 Anemia in chronic kidney disease: Secondary | ICD-10-CM | POA: Diagnosis not present

## 2017-06-02 DIAGNOSIS — I130 Hypertensive heart and chronic kidney disease with heart failure and stage 1 through stage 4 chronic kidney disease, or unspecified chronic kidney disease: Secondary | ICD-10-CM | POA: Diagnosis not present

## 2017-06-02 DIAGNOSIS — I2510 Atherosclerotic heart disease of native coronary artery without angina pectoris: Secondary | ICD-10-CM | POA: Diagnosis not present

## 2017-06-02 DIAGNOSIS — I4891 Unspecified atrial fibrillation: Secondary | ICD-10-CM | POA: Diagnosis not present

## 2017-06-02 DIAGNOSIS — I69320 Aphasia following cerebral infarction: Secondary | ICD-10-CM | POA: Diagnosis not present

## 2017-06-02 DIAGNOSIS — G8311 Monoplegia of lower limb affecting right dominant side: Secondary | ICD-10-CM | POA: Diagnosis not present

## 2017-06-02 DIAGNOSIS — I5032 Chronic diastolic (congestive) heart failure: Secondary | ICD-10-CM | POA: Diagnosis not present

## 2017-06-02 DIAGNOSIS — C7951 Secondary malignant neoplasm of bone: Secondary | ICD-10-CM | POA: Diagnosis not present

## 2017-06-05 DIAGNOSIS — I4891 Unspecified atrial fibrillation: Secondary | ICD-10-CM | POA: Diagnosis not present

## 2017-06-05 DIAGNOSIS — N183 Chronic kidney disease, stage 3 (moderate): Secondary | ICD-10-CM | POA: Diagnosis not present

## 2017-06-05 DIAGNOSIS — Z466 Encounter for fitting and adjustment of urinary device: Secondary | ICD-10-CM | POA: Diagnosis not present

## 2017-06-05 DIAGNOSIS — I5032 Chronic diastolic (congestive) heart failure: Secondary | ICD-10-CM | POA: Diagnosis not present

## 2017-06-05 DIAGNOSIS — E7800 Pure hypercholesterolemia, unspecified: Secondary | ICD-10-CM | POA: Diagnosis not present

## 2017-06-05 DIAGNOSIS — D631 Anemia in chronic kidney disease: Secondary | ICD-10-CM | POA: Diagnosis not present

## 2017-06-05 DIAGNOSIS — G8311 Monoplegia of lower limb affecting right dominant side: Secondary | ICD-10-CM | POA: Diagnosis not present

## 2017-06-05 DIAGNOSIS — C3490 Malignant neoplasm of unspecified part of unspecified bronchus or lung: Secondary | ICD-10-CM | POA: Diagnosis not present

## 2017-06-05 DIAGNOSIS — C7951 Secondary malignant neoplasm of bone: Secondary | ICD-10-CM | POA: Diagnosis not present

## 2017-06-05 DIAGNOSIS — I130 Hypertensive heart and chronic kidney disease with heart failure and stage 1 through stage 4 chronic kidney disease, or unspecified chronic kidney disease: Secondary | ICD-10-CM | POA: Diagnosis not present

## 2017-06-05 DIAGNOSIS — I69320 Aphasia following cerebral infarction: Secondary | ICD-10-CM | POA: Diagnosis not present

## 2017-06-05 DIAGNOSIS — I255 Ischemic cardiomyopathy: Secondary | ICD-10-CM | POA: Diagnosis not present

## 2017-06-05 DIAGNOSIS — I2510 Atherosclerotic heart disease of native coronary artery without angina pectoris: Secondary | ICD-10-CM | POA: Diagnosis not present

## 2017-06-12 DIAGNOSIS — G8311 Monoplegia of lower limb affecting right dominant side: Secondary | ICD-10-CM | POA: Diagnosis not present

## 2017-06-12 DIAGNOSIS — D631 Anemia in chronic kidney disease: Secondary | ICD-10-CM | POA: Diagnosis not present

## 2017-06-12 DIAGNOSIS — I255 Ischemic cardiomyopathy: Secondary | ICD-10-CM | POA: Diagnosis not present

## 2017-06-12 DIAGNOSIS — I69320 Aphasia following cerebral infarction: Secondary | ICD-10-CM | POA: Diagnosis not present

## 2017-06-12 DIAGNOSIS — Z466 Encounter for fitting and adjustment of urinary device: Secondary | ICD-10-CM | POA: Diagnosis not present

## 2017-06-12 DIAGNOSIS — C7951 Secondary malignant neoplasm of bone: Secondary | ICD-10-CM | POA: Diagnosis not present

## 2017-06-12 DIAGNOSIS — I2510 Atherosclerotic heart disease of native coronary artery without angina pectoris: Secondary | ICD-10-CM | POA: Diagnosis not present

## 2017-06-12 DIAGNOSIS — I4891 Unspecified atrial fibrillation: Secondary | ICD-10-CM | POA: Diagnosis not present

## 2017-06-12 DIAGNOSIS — I130 Hypertensive heart and chronic kidney disease with heart failure and stage 1 through stage 4 chronic kidney disease, or unspecified chronic kidney disease: Secondary | ICD-10-CM | POA: Diagnosis not present

## 2017-06-12 DIAGNOSIS — C3490 Malignant neoplasm of unspecified part of unspecified bronchus or lung: Secondary | ICD-10-CM | POA: Diagnosis not present

## 2017-06-12 DIAGNOSIS — N183 Chronic kidney disease, stage 3 (moderate): Secondary | ICD-10-CM | POA: Diagnosis not present

## 2017-06-12 DIAGNOSIS — I5032 Chronic diastolic (congestive) heart failure: Secondary | ICD-10-CM | POA: Diagnosis not present

## 2017-06-12 DIAGNOSIS — E7800 Pure hypercholesterolemia, unspecified: Secondary | ICD-10-CM | POA: Diagnosis not present

## 2017-06-13 DIAGNOSIS — I5032 Chronic diastolic (congestive) heart failure: Secondary | ICD-10-CM | POA: Diagnosis not present

## 2017-06-13 DIAGNOSIS — I255 Ischemic cardiomyopathy: Secondary | ICD-10-CM | POA: Diagnosis not present

## 2017-06-13 DIAGNOSIS — I130 Hypertensive heart and chronic kidney disease with heart failure and stage 1 through stage 4 chronic kidney disease, or unspecified chronic kidney disease: Secondary | ICD-10-CM | POA: Diagnosis not present

## 2017-06-13 DIAGNOSIS — C7951 Secondary malignant neoplasm of bone: Secondary | ICD-10-CM | POA: Diagnosis not present

## 2017-06-13 DIAGNOSIS — E7800 Pure hypercholesterolemia, unspecified: Secondary | ICD-10-CM | POA: Diagnosis not present

## 2017-06-13 DIAGNOSIS — I2510 Atherosclerotic heart disease of native coronary artery without angina pectoris: Secondary | ICD-10-CM | POA: Diagnosis not present

## 2017-06-13 DIAGNOSIS — I69320 Aphasia following cerebral infarction: Secondary | ICD-10-CM | POA: Diagnosis not present

## 2017-06-13 DIAGNOSIS — Z466 Encounter for fitting and adjustment of urinary device: Secondary | ICD-10-CM | POA: Diagnosis not present

## 2017-06-13 DIAGNOSIS — D631 Anemia in chronic kidney disease: Secondary | ICD-10-CM | POA: Diagnosis not present

## 2017-06-13 DIAGNOSIS — C3490 Malignant neoplasm of unspecified part of unspecified bronchus or lung: Secondary | ICD-10-CM | POA: Diagnosis not present

## 2017-06-13 DIAGNOSIS — G8311 Monoplegia of lower limb affecting right dominant side: Secondary | ICD-10-CM | POA: Diagnosis not present

## 2017-06-13 DIAGNOSIS — N183 Chronic kidney disease, stage 3 (moderate): Secondary | ICD-10-CM | POA: Diagnosis not present

## 2017-06-13 DIAGNOSIS — I4891 Unspecified atrial fibrillation: Secondary | ICD-10-CM | POA: Diagnosis not present

## 2017-06-15 ENCOUNTER — Encounter (HOSPITAL_COMMUNITY): Payer: Self-pay

## 2017-06-15 ENCOUNTER — Other Ambulatory Visit: Payer: Self-pay

## 2017-06-15 ENCOUNTER — Inpatient Hospital Stay (HOSPITAL_COMMUNITY)
Admission: EM | Admit: 2017-06-15 | Discharge: 2017-06-20 | DRG: 698 | Disposition: A | Discharge status: Hospice | Payer: Medicare Other | Attending: Internal Medicine | Admitting: Internal Medicine

## 2017-06-15 DIAGNOSIS — E875 Hyperkalemia: Secondary | ICD-10-CM | POA: Diagnosis not present

## 2017-06-15 DIAGNOSIS — N170 Acute kidney failure with tubular necrosis: Secondary | ICD-10-CM | POA: Diagnosis present

## 2017-06-15 DIAGNOSIS — I340 Nonrheumatic mitral (valve) insufficiency: Secondary | ICD-10-CM | POA: Diagnosis not present

## 2017-06-15 DIAGNOSIS — I5043 Acute on chronic combined systolic (congestive) and diastolic (congestive) heart failure: Secondary | ICD-10-CM | POA: Diagnosis not present

## 2017-06-15 DIAGNOSIS — Z91040 Latex allergy status: Secondary | ICD-10-CM

## 2017-06-15 DIAGNOSIS — F05 Delirium due to known physiological condition: Secondary | ICD-10-CM | POA: Diagnosis not present

## 2017-06-15 DIAGNOSIS — E872 Acidosis: Secondary | ICD-10-CM | POA: Diagnosis not present

## 2017-06-15 DIAGNOSIS — Z960 Presence of urogenital implants: Secondary | ICD-10-CM | POA: Diagnosis present

## 2017-06-15 DIAGNOSIS — N029 Recurrent and persistent hematuria with unspecified morphologic changes: Secondary | ICD-10-CM | POA: Diagnosis not present

## 2017-06-15 DIAGNOSIS — I4891 Unspecified atrial fibrillation: Secondary | ICD-10-CM | POA: Diagnosis present

## 2017-06-15 DIAGNOSIS — Z91013 Allergy to seafood: Secondary | ICD-10-CM

## 2017-06-15 DIAGNOSIS — Z5189 Encounter for other specified aftercare: Secondary | ICD-10-CM | POA: Diagnosis not present

## 2017-06-15 DIAGNOSIS — Z9221 Personal history of antineoplastic chemotherapy: Secondary | ICD-10-CM

## 2017-06-15 DIAGNOSIS — Z880 Allergy status to penicillin: Secondary | ICD-10-CM

## 2017-06-15 DIAGNOSIS — N3090 Cystitis, unspecified without hematuria: Secondary | ICD-10-CM

## 2017-06-15 DIAGNOSIS — E861 Hypovolemia: Secondary | ICD-10-CM | POA: Diagnosis present

## 2017-06-15 DIAGNOSIS — D696 Thrombocytopenia, unspecified: Secondary | ICD-10-CM | POA: Diagnosis not present

## 2017-06-15 DIAGNOSIS — I132 Hypertensive heart and chronic kidney disease with heart failure and with stage 5 chronic kidney disease, or end stage renal disease: Secondary | ICD-10-CM | POA: Diagnosis not present

## 2017-06-15 DIAGNOSIS — K219 Gastro-esophageal reflux disease without esophagitis: Secondary | ICD-10-CM | POA: Diagnosis present

## 2017-06-15 DIAGNOSIS — I959 Hypotension, unspecified: Secondary | ICD-10-CM | POA: Diagnosis not present

## 2017-06-15 DIAGNOSIS — Z91048 Other nonmedicinal substance allergy status: Secondary | ICD-10-CM

## 2017-06-15 DIAGNOSIS — G9341 Metabolic encephalopathy: Secondary | ICD-10-CM | POA: Diagnosis not present

## 2017-06-15 DIAGNOSIS — D631 Anemia in chronic kidney disease: Secondary | ICD-10-CM | POA: Diagnosis present

## 2017-06-15 DIAGNOSIS — E161 Other hypoglycemia: Secondary | ICD-10-CM | POA: Diagnosis not present

## 2017-06-15 DIAGNOSIS — N390 Urinary tract infection, site not specified: Secondary | ICD-10-CM | POA: Diagnosis present

## 2017-06-15 DIAGNOSIS — N3091 Cystitis, unspecified with hematuria: Secondary | ICD-10-CM | POA: Diagnosis present

## 2017-06-15 DIAGNOSIS — N185 Chronic kidney disease, stage 5: Secondary | ICD-10-CM | POA: Diagnosis not present

## 2017-06-15 DIAGNOSIS — T83511A Infection and inflammatory reaction due to indwelling urethral catheter, initial encounter: Secondary | ICD-10-CM | POA: Diagnosis not present

## 2017-06-15 DIAGNOSIS — I639 Cerebral infarction, unspecified: Secondary | ICD-10-CM | POA: Diagnosis present

## 2017-06-15 DIAGNOSIS — N179 Acute kidney failure, unspecified: Secondary | ICD-10-CM

## 2017-06-15 DIAGNOSIS — T83518A Infection and inflammatory reaction due to other urinary catheter, initial encounter: Secondary | ICD-10-CM | POA: Diagnosis not present

## 2017-06-15 DIAGNOSIS — L8990 Pressure ulcer of unspecified site, unspecified stage: Secondary | ICD-10-CM | POA: Diagnosis not present

## 2017-06-15 DIAGNOSIS — I361 Nonrheumatic tricuspid (valve) insufficiency: Secondary | ICD-10-CM | POA: Diagnosis not present

## 2017-06-15 DIAGNOSIS — E162 Hypoglycemia, unspecified: Secondary | ICD-10-CM | POA: Diagnosis not present

## 2017-06-15 DIAGNOSIS — N184 Chronic kidney disease, stage 4 (severe): Secondary | ICD-10-CM

## 2017-06-15 DIAGNOSIS — Z881 Allergy status to other antibiotic agents status: Secondary | ICD-10-CM

## 2017-06-15 DIAGNOSIS — Z95828 Presence of other vascular implants and grafts: Secondary | ICD-10-CM

## 2017-06-15 DIAGNOSIS — M21371 Foot drop, right foot: Secondary | ICD-10-CM | POA: Diagnosis present

## 2017-06-15 DIAGNOSIS — C7951 Secondary malignant neoplasm of bone: Secondary | ICD-10-CM | POA: Diagnosis present

## 2017-06-15 DIAGNOSIS — Z888 Allergy status to other drugs, medicaments and biological substances status: Secondary | ICD-10-CM

## 2017-06-15 DIAGNOSIS — Z7189 Other specified counseling: Secondary | ICD-10-CM | POA: Diagnosis not present

## 2017-06-15 DIAGNOSIS — I255 Ischemic cardiomyopathy: Secondary | ICD-10-CM | POA: Diagnosis not present

## 2017-06-15 DIAGNOSIS — Z66 Do not resuscitate: Secondary | ICD-10-CM | POA: Diagnosis not present

## 2017-06-15 DIAGNOSIS — E871 Hypo-osmolality and hyponatremia: Secondary | ICD-10-CM | POA: Diagnosis not present

## 2017-06-15 DIAGNOSIS — Z8673 Personal history of transient ischemic attack (TIA), and cerebral infarction without residual deficits: Secondary | ICD-10-CM

## 2017-06-15 DIAGNOSIS — C3490 Malignant neoplasm of unspecified part of unspecified bronchus or lung: Secondary | ICD-10-CM | POA: Diagnosis present

## 2017-06-15 DIAGNOSIS — Z79899 Other long term (current) drug therapy: Secondary | ICD-10-CM

## 2017-06-15 DIAGNOSIS — T83511D Infection and inflammatory reaction due to indwelling urethral catheter, subsequent encounter: Secondary | ICD-10-CM | POA: Diagnosis not present

## 2017-06-15 DIAGNOSIS — Z886 Allergy status to analgesic agent status: Secondary | ICD-10-CM

## 2017-06-15 DIAGNOSIS — R680 Hypothermia, not associated with low environmental temperature: Secondary | ICD-10-CM | POA: Diagnosis present

## 2017-06-15 DIAGNOSIS — Y846 Urinary catheterization as the cause of abnormal reaction of the patient, or of later complication, without mention of misadventure at the time of the procedure: Secondary | ICD-10-CM | POA: Diagnosis present

## 2017-06-15 DIAGNOSIS — R402413 Glasgow coma scale score 13-15, at hospital admission: Secondary | ICD-10-CM | POA: Diagnosis present

## 2017-06-15 DIAGNOSIS — T83510D Infection and inflammatory reaction due to cystostomy catheter, subsequent encounter: Secondary | ICD-10-CM | POA: Diagnosis not present

## 2017-06-15 DIAGNOSIS — Z87891 Personal history of nicotine dependence: Secondary | ICD-10-CM

## 2017-06-15 DIAGNOSIS — I509 Heart failure, unspecified: Secondary | ICD-10-CM

## 2017-06-15 DIAGNOSIS — Z515 Encounter for palliative care: Secondary | ICD-10-CM | POA: Diagnosis present

## 2017-06-15 DIAGNOSIS — I2510 Atherosclerotic heart disease of native coronary artery without angina pectoris: Secondary | ICD-10-CM | POA: Diagnosis present

## 2017-06-15 DIAGNOSIS — Z923 Personal history of irradiation: Secondary | ICD-10-CM

## 2017-06-15 DIAGNOSIS — Z882 Allergy status to sulfonamides status: Secondary | ICD-10-CM

## 2017-06-15 DIAGNOSIS — I351 Nonrheumatic aortic (valve) insufficiency: Secondary | ICD-10-CM | POA: Diagnosis not present

## 2017-06-15 LAB — COMPREHENSIVE METABOLIC PANEL
ALK PHOS: 93 U/L (ref 38–126)
ALT: 10 U/L — AB (ref 14–54)
ANION GAP: 10 (ref 5–15)
AST: 18 U/L (ref 15–41)
Albumin: 3.2 g/dL — ABNORMAL LOW (ref 3.5–5.0)
BILIRUBIN TOTAL: 0.6 mg/dL (ref 0.3–1.2)
BUN: 78 mg/dL — ABNORMAL HIGH (ref 6–20)
CALCIUM: 7.7 mg/dL — AB (ref 8.9–10.3)
CO2: 22 mmol/L (ref 22–32)
CREATININE: 4.87 mg/dL — AB (ref 0.44–1.00)
Chloride: 109 mmol/L (ref 101–111)
GFR, EST AFRICAN AMERICAN: 9 mL/min — AB (ref >60)
GFR, EST NON AFRICAN AMERICAN: 8 mL/min — AB (ref >60)
Glucose, Bld: 116 mg/dL — ABNORMAL HIGH (ref 65–99)
Potassium: 4.9 mmol/L (ref 3.5–5.1)
Sodium: 141 mmol/L (ref 135–145)
TOTAL PROTEIN: 6.2 g/dL — AB (ref 6.5–8.1)

## 2017-06-15 LAB — URINALYSIS, ROUTINE W REFLEX MICROSCOPIC
Bilirubin Urine: NEGATIVE
GLUCOSE, UA: NEGATIVE mg/dL
Ketones, ur: NEGATIVE mg/dL
NITRITE: POSITIVE — AB
PROTEIN: 100 mg/dL — AB
SPECIFIC GRAVITY, URINE: 1.01 (ref 1.005–1.030)
pH: 7 (ref 5.0–8.0)

## 2017-06-15 LAB — CBC WITH DIFFERENTIAL/PLATELET
Basophils Absolute: 0 10*3/uL (ref 0.0–0.1)
Basophils Relative: 0 %
EOS ABS: 0.1 10*3/uL (ref 0.0–0.7)
Eosinophils Relative: 4 %
HEMATOCRIT: 31.7 % — AB (ref 36.0–46.0)
HEMOGLOBIN: 9.7 g/dL — AB (ref 12.0–15.0)
Lymphocytes Relative: 9 %
Lymphs Abs: 0.2 10*3/uL — ABNORMAL LOW (ref 0.7–4.0)
MCH: 28 pg (ref 26.0–34.0)
MCHC: 30.6 g/dL (ref 30.0–36.0)
MCV: 91.6 fL (ref 78.0–100.0)
MONOS PCT: 8 %
Monocytes Absolute: 0.2 10*3/uL (ref 0.1–1.0)
NEUTROS ABS: 2 10*3/uL (ref 1.7–7.7)
NEUTROS PCT: 79 %
Platelets: 55 10*3/uL — ABNORMAL LOW (ref 150–400)
RBC: 3.46 MIL/uL — ABNORMAL LOW (ref 3.87–5.11)
RDW: 16.8 % — ABNORMAL HIGH (ref 11.5–15.5)
WBC: 2.6 10*3/uL — ABNORMAL LOW (ref 4.0–10.5)

## 2017-06-15 LAB — LACTIC ACID, PLASMA: LACTIC ACID, VENOUS: 0.6 mmol/L (ref 0.5–1.9)

## 2017-06-15 MED ORDER — DEXTROSE 5 % IV SOLN
1.0000 g | Freq: Once | INTRAVENOUS | Status: AC
  Administered 2017-06-15: 1 g via INTRAVENOUS
  Filled 2017-06-15: qty 10

## 2017-06-15 MED ORDER — DIPHENHYDRAMINE HCL 12.5 MG/5ML PO ELIX
12.5000 mg | ORAL_SOLUTION | Freq: Once | ORAL | Status: AC
  Administered 2017-06-15: 12.5 mg via ORAL
  Filled 2017-06-15: qty 5

## 2017-06-15 MED ORDER — ONDANSETRON HCL 4 MG/2ML IJ SOLN: 4.0000 mg | Freq: Four times a day (QID) | INTRAMUSCULAR | Status: DC | PRN

## 2017-06-15 MED ORDER — SODIUM CHLORIDE 0.9 % IV SOLN: 250.0000 mL | INTRAVENOUS | Status: DC | PRN

## 2017-06-15 MED ORDER — PANTOPRAZOLE SODIUM 40 MG PO TBEC
40.0000 mg | DELAYED_RELEASE_TABLET | Freq: Every day | ORAL | Status: DC
  Administered 2017-06-16 – 2017-06-19 (×4): 40 mg via ORAL
  Filled 2017-06-15 (×4): qty 1

## 2017-06-15 MED ORDER — METHYLPREDNISOLONE SODIUM SUCC 125 MG IJ SOLR
125.0000 mg | Freq: Once | INTRAMUSCULAR | Status: AC
  Administered 2017-06-15: 125 mg via INTRAVENOUS
  Filled 2017-06-15: qty 2

## 2017-06-15 MED ORDER — ONDANSETRON HCL 4 MG PO TABS: 4.0000 mg | ORAL_TABLET | Freq: Four times a day (QID) | ORAL | Status: DC | PRN

## 2017-06-15 MED ORDER — ACETAMINOPHEN 325 MG PO TABS
650.0000 mg | ORAL_TABLET | Freq: Four times a day (QID) | ORAL | Status: DC | PRN
  Administered 2017-06-16 – 2017-06-18 (×4): 650 mg via ORAL
  Filled 2017-06-15 (×4): qty 2

## 2017-06-15 MED ORDER — DEXTROSE 5 % IV SOLN
1.0000 g | INTRAVENOUS | Status: DC
  Administered 2017-06-16 – 2017-06-19 (×4): 1 g via INTRAVENOUS
  Filled 2017-06-15 (×7): qty 10

## 2017-06-15 MED ORDER — SODIUM CHLORIDE 0.9% FLUSH
3.0000 mL | Freq: Two times a day (BID) | INTRAVENOUS | Status: DC
  Administered 2017-06-15 – 2017-06-18 (×5): 3 mL via INTRAVENOUS

## 2017-06-15 MED ORDER — METOPROLOL TARTRATE 25 MG PO TABS
25.0000 mg | ORAL_TABLET | Freq: Every day | ORAL | Status: DC
  Administered 2017-06-16 – 2017-06-19 (×4): 25 mg via ORAL
  Filled 2017-06-15 (×4): qty 1

## 2017-06-15 MED ORDER — SODIUM CHLORIDE 0.9 % IV BOLUS (SEPSIS)
500.0000 mL | Freq: Once | INTRAVENOUS | Status: AC
Start: 2017-06-15 — End: 2017-06-15
  Administered 2017-06-15: 500 mL via INTRAVENOUS

## 2017-06-15 MED ORDER — SENNOSIDES-DOCUSATE SODIUM 8.6-50 MG PO TABS: 1.0000 | ORAL_TABLET | Freq: Every evening | ORAL | Status: DC | PRN

## 2017-06-15 MED ORDER — GABAPENTIN 100 MG PO CAPS
100.0000 mg | ORAL_CAPSULE | Freq: Every day | ORAL | Status: DC
  Administered 2017-06-15 – 2017-06-16 (×2): 100 mg via ORAL
  Filled 2017-06-15 (×2): qty 1

## 2017-06-15 MED ORDER — SODIUM CHLORIDE 0.9 % IV BOLUS (SEPSIS)
500.0000 mL | Freq: Once | INTRAVENOUS | Status: AC
  Administered 2017-06-15: 500 mL via INTRAVENOUS

## 2017-06-15 MED ORDER — ACETAMINOPHEN 650 MG RE SUPP: 650.0000 mg | Freq: Four times a day (QID) | RECTAL | Status: DC | PRN

## 2017-06-15 MED ORDER — SODIUM CHLORIDE 0.9% FLUSH: 3.0000 mL | INTRAVENOUS | Status: DC | PRN

## 2017-06-15 NOTE — ED Notes (Signed)
Patient given dinner tray as requested by patient.

## 2017-06-15 NOTE — ED Notes (Signed)
Oxygen sat 90% on RA. Placed patient on 23 lpm of oxygen. sats increased to 94%.

## 2017-06-15 NOTE — ED Notes (Signed)
ED Provider at bedside. 

## 2017-06-15 NOTE — ED Triage Notes (Signed)
Pt brought in by [facility] from home. Pt has chronic foley and daughter noticed blood in cath bag. EMS urine did have red blood in and bag emptied. Pt only reports burning from anus yesterday and non blanchable area on sacrum. CBG 66 and was given oral glucose gel enroute . CBG up to 78

## 2017-06-15 NOTE — ED Provider Notes (Signed)
[facility] EMERGENCY DEPARTMENT Provider Note   CSN: [id number] Arrival date & time: 06/15/17  1133     History   Chief Complaint Chief Complaint  Patient presents with  . Hematuria    HPI [name] is a 84 y.o. female.  Patient states that she has had some blood in her urine.  Patient has a Foley.  Patient's daughter states the patient is somewhat confused now.  She lives by herself   The history is provided by the patient and a relative. No language interpreter was used.  Hematuria  This is a new problem. The current episode started 12 to 24 hours ago. The problem occurs constantly. The problem has not changed since onset.Pertinent negatives include no chest pain, no abdominal pain and no headaches. Nothing aggravates the symptoms. Nothing relieves the symptoms. She has tried nothing for the symptoms. The treatment provided no relief.    Past Medical History:  Diagnosis Date  . A-fib ([place])   . Chronic anemia   . Chronic diastolic (congestive) heart failure ([place])   . Chronic fatigue 04/04/2015  . Chronic fatigue 04/04/2015  . CKD (chronic kidney disease)   . Coronary artery disease    a.  LHC (06/04/05): LHC done in [place] with high grade RCA => s/p BMS to RCA;  b.  Nuclear (09/14/09): Lexiscan; Inf infarct with mild peri-infarct ishemia, EF 52%; Low Risk.  Marland Kitchen DVT (deep venous thrombosis) ([place])   . Foot drop, right   . GERD (gastroesophageal reflux disease)   . Goals of care, counseling/discussion 09/30/2016  . Hypercholesterolemia   . Hypertension   . Ischemic cardiomyopathy    a. Echo (07/26/13): Mild LVH, EF 35-40%, diff HK, inf AK, Gr 2 DD, Tr AI, mildly dilated Ao root, MAC, mild MR, mild LAE, mod reduced RVSF.  . Non-small cell carcinoma of lung ([place])    Stage IV;spinal cancer; had chemo and radiation and immune therapy  . Stroke [facility])     Patient Active Problem List   Diagnosis Date Noted  . Palliative care encounter   . Encounter for hospice  care discussion   . Chronic fatigue 01/20/2017  . Stage 3 chronic kidney disease ([place]) 01/07/2017  . History of DVT (deep vein thrombosis) 01/07/2017  . Acute CVA (cerebrovascular accident) ([place])   . Hyperlipidemia   . Primary malignant neoplasm of lung metastatic to other site [facility])   . Stroke (cerebrum) ([place]) 10/30/2016  . Encounter for antineoplastic immunotherapy 09/30/2016  . Goals of care, counseling/discussion 09/30/2016  . Port catheter in place 03/20/2016  . Abdominal pain   . Acute on chronic renal failure ([place])   . Acute on chronic respiratory failure ([place]) 07/25/2015  . SOB (shortness of breath) 07/25/2015  . Catheter-associated urinary tract infection ([place]) 07/20/2015  . Atrial fibrillation ([place]) 07/19/2015  . Acute on chronic diastolic (congestive) heart failure ([place]) 07/19/2015  . UTI (lower urinary tract infection) 07/17/2015  . Hyperkalemia 07/17/2015  . Dehydration 07/17/2015  . Right foot ulcer ([place])   . Abscess   . Right leg DVT ([place]) 06/24/2015  . Diarrhea 06/24/2015  . Edema   . Sepsis ([place])   . Malnutrition of moderate degree 06/18/2015  . Hypothermia 06/16/2015  . Paraspinal abscess ([place])   . Acute renal failure superimposed on stage 3 chronic kidney disease ([place]) 03/17/2015  . Anemia of chronic disease 03/17/2015  . Hypokalemia 03/17/2015  . Hypotension 02/23/2015  . Lung cancer stage IV with Metastatic squamous cell carcinoma to  bone ([place]) 11/28/2014  . Pressure ulcer 11/24/2014    Past Surgical History:  Procedure Laterality Date  . ABDOMINAL HYSTERECTOMY    . APPENDECTOMY    . CATARACT EXTRACTION Bilateral   . CHOLECYSTECTOMY  2011  . CYSTOSCOPY     with stent placement  . CYSTOSCOPY W/ URETERAL STENT PLACEMENT Right 12/12/2015   Procedure: CYSTOSCOPY WITH RETROGRADE PYELOGRAM WITH STENT EXCHANGE;  Surgeon: Franchot Gallo, MD;  Location: [facility];  Service: Urology;  Laterality: Right;  . ERCP  2011   with stone extraction, done same day as  cholecystectomy  . HEMATOMA EVACUATION  December 2006   groin  . HEMORROIDECTOMY    . LAMINECTOMY    . PORTACATH PLACEMENT    . TONSILLECTOMY      OB History    No data available       Home Medications    Prior to Admission medications   Medication Sig Start Date End Date Taking? Authorizing Provider  acetaminophen (TYLENOL) 500 MG tablet Take 500 mg by mouth daily as needed for mild pain.   Yes [provider]  diphenoxylate-atropine (LOMOTIL) 2.5-0.025 MG tablet Take 1 tablet by mouth 4 (four) times daily as needed for diarrhea or loose stools. Reported on 11/28/2015   Yes [provider]  furosemide (LASIX) 40 MG tablet Take 40 mg by mouth daily as needed for fluid or edema.  03/15/16  Yes [provider]  gabapentin (NEURONTIN) 100 MG capsule Take 100 mg by mouth daily as needed.   Yes [provider]  loperamide (IMODIUM) 2 MG capsule Take 1 capsule (2 mg total) by mouth every 6 (six) hours as needed for diarrhea or loose stools. 07/02/15  Yes Robbie Lis, MD  metoprolol tartrate (LOPRESSOR) 25 MG tablet Take 0.5 tablets (12.5 mg total) by mouth at bedtime. Patient taking differently: Take 25 mg by mouth daily.  11/03/16  Yes Riccio, Angela C, DO  pantoprazole (PROTONIX) 40 MG tablet Take 40 mg by mouth daily before breakfast.    Yes [provider]  Pembrolizumab (KEYTRUDA IV) Inject into the vein every 21 ( twenty-one) days.    [provider]    Family History Family History  Problem Relation Age of Onset  . Heart failure Mother   . Lung cancer Sister   . Lung cancer Brother   . Stomach cancer Brother   . Stroke Other   . Heart attack Neg Hx   . Colon cancer Neg Hx     Social History Social History   Tobacco Use  . Smoking status: Former Smoker    Packs/day: 1.00    Years: 35.00    Pack years: 35.00    Types: Cigarettes    Last attempt to quit: 12/07/1990    Years since quitting: 26.5  . Smokeless tobacco:  Never Used  Substance Use Topics  . Alcohol use: No  . Drug use: No     Allergies   Amlodipine; Ciprofloxacin; Mirtazapine; Statins; Aspirin; Cephalexin; Hydralazine; Iron; Ambien [zolpidem tartrate]; Eliquis [apixaban]; Lorazepam; Sulfonamide derivatives; Xarelto [rivaroxaban]; Zolpidem; Latex; Penicillins; Shellfish allergy; and Tape   Review of Systems Review of Systems  Constitutional: Negative for appetite change and fatigue.  HENT: Negative for congestion, ear discharge and sinus pressure.   Eyes: Negative for discharge.  Respiratory: Negative for cough.   Cardiovascular: Negative for chest pain.  Gastrointestinal: Negative for abdominal pain and diarrhea.  Genitourinary: Positive for hematuria. Negative for frequency.  Musculoskeletal: Negative for back  pain.  Skin: Negative for rash.  Neurological: Negative for seizures and headaches.  Psychiatric/Behavioral: Negative for hallucinations.     Physical Exam Updated Vital Signs BP 121/60   Pulse (!) 45   Temp (!) 92.5 F (33.6 C) (Rectal)   Resp (!) 52   Wt 59.9 kg (132 lb)   SpO2 95%   BMI 20.07 kg/m   Physical Exam  Constitutional: She appears well-developed.  HENT:  Head: Normocephalic.  Eyes: Conjunctivae and EOM are normal. No scleral icterus.  Neck: Neck supple. No thyromegaly present.  Cardiovascular: Normal rate and regular rhythm. Exam reveals no gallop and no friction rub.  No murmur heard. Pulmonary/Chest: No stridor. She has no wheezes. She has no rales. She exhibits no tenderness.  Abdominal: She exhibits no distension. There is no tenderness. There is no rebound.  Genitourinary:  Genitourinary Comments: Patient has a Foley catheter  Musculoskeletal: Normal range of motion. She exhibits no edema.  Lymphadenopathy:    She has no cervical adenopathy.  Neurological: She is alert. She exhibits normal muscle tone. Coordination normal.  Mild confusion  Skin: No rash noted. No erythema.    Psychiatric: She has a normal mood and affect. Her behavior is normal.  Nursing note and vitals reviewed.    ED Treatments / Results  Labs (all labs ordered are listed, but only abnormal results are displayed) Labs Reviewed  CBC WITH DIFFERENTIAL/PLATELET - Abnormal; Notable for the following components:      Result Value   WBC 2.6 (*)    RBC 3.46 (*)    Hemoglobin 9.7 (*)    HCT 31.7 (*)    RDW 16.8 (*)    Platelets 55 (*)    Lymphs Abs 0.2 (*)    All other components within normal limits  COMPREHENSIVE METABOLIC PANEL - Abnormal; Notable for the following components:   Glucose, Bld 116 (*)    BUN 78 (*)    Creatinine, Ser 4.87 (*)    Calcium 7.7 (*)    Total Protein 6.2 (*)    Albumin 3.2 (*)    ALT 10 (*)    GFR calc non Af Amer 8 (*)    GFR calc Af Amer 9 (*)    All other components within normal limits  URINALYSIS, ROUTINE W REFLEX MICROSCOPIC - Abnormal; Notable for the following components:   APPearance CLOUDY (*)    Hgb urine dipstick LARGE (*)    Protein, ur 100 (*)    Nitrite POSITIVE (*)    Leukocytes, UA LARGE (*)    Bacteria, UA FEW (*)    Squamous Epithelial / LPF 0-5 (*)    All other components within normal limits  CULTURE, BLOOD (ROUTINE X 2)  CULTURE, BLOOD (ROUTINE X 2)  URINE CULTURE  LACTIC ACID, PLASMA    EKG  EKG Interpretation None       Radiology No results found.  Procedures Procedures (including critical care time)  Medications Ordered in ED Medications  cefTRIAXone (ROCEPHIN) 1 g in dextrose 5 % 50 mL IVPB (not administered)  sodium chloride 0.9 % bolus 500 mL (0 mLs Intravenous Stopped 06/15/17 1400)  methylPREDNISolone sodium succinate (SOLU-MEDROL) 125 mg/2 mL injection 125 mg (125 mg Intravenous Given 06/15/17 1522)  sodium chloride 0.9 % bolus 500 mL (500 mLs Intravenous New Bag/Given 06/15/17 1522)     Initial Impression / Assessment and Plan / ED Course  I have reviewed the triage vital signs and the nursing  notes.  Pertinent labs &  imaging results that were available during my care of the patient were reviewed by me and considered in my medical decision making (see chart for details). CRITICAL CARE Performed by: Milton Ferguson Total critical care time: 35 minutes Critical care time was exclusive of separately billable procedures and treating other patients. Critical care was necessary to treat or prevent imminent or life-threatening deterioration. Critical care was time spent personally by me on the following activities: development of treatment plan with patient and/or surrogate as well as nursing, discussions with consultants, evaluation of patient's response to treatment, examination of patient, obtaining history from patient or surrogate, ordering and performing treatments and interventions, ordering and review of laboratory studies, ordering and review of radiographic studies, pulse oximetry and re-evaluation of patient's condition. Patient with urinary tract infection and hypothermia.  Patient possibly could be septic and will be admitted to the hospital for IV antibiotics      Final Clinical Impressions(s) / ED Diagnoses   Final diagnoses:  Cystitis    ED Discharge Orders    None       Milton Ferguson, MD 06/15/17 1530

## 2017-06-15 NOTE — ED Notes (Signed)
Pt stated that she is "Burning up with this hot blanket on", Pt rectal temp was still low, pt stated that her normal oral temp is low 97's usually. RN aware.

## 2017-06-15 NOTE — H&P (Signed)
History and Physical    [name] PTW:[id number] DOB: 01-02-1934 DOA: 06/15/2017  Referring MD/NP/PA: Milton Ferguson, EDP PCP: Celene Squibb, MD  Patient coming from: Home  Chief Complaint: Blood in urine  HPI: [name] is a 84 y.o. female who live at home with her daughter who brings her in today because she has noted a significant amount of blood in her urine in the foley bag over the past 12 hours. Patient has a h/o stage IV lung cancer with mets to her spine and developed a large wound on that area in 2016 that has since closed. They had placed a foley catheter to allow that area to heal, however, it was never removed and she has been catheter-dependent since. Failed voiding trial at GU office in December 2018.  Also has a h/o afib and CVA and is not on anticoagulation due to recurrent hematuria. She is asymptomatic. Has no pain. Is mentating appropriately. In the ED she was found to be hypothermic with a core body temp of 92.3. Rest of VS are stable. Labs show a Cr of 4.87 (baseline 3.9), Hb 9.7, MCV 91.6, UA is significant for a UTI. Admission is requested.  Past Medical/Surgical History: Past Medical History:  Diagnosis Date  . A-fib ([place])   . Chronic anemia   . Chronic diastolic (congestive) heart failure ([place])   . Chronic fatigue 04/04/2015  . Chronic fatigue 04/04/2015  . CKD (chronic kidney disease)   . Coronary artery disease    a.  LHC (06/04/05): LHC done in [place] with high grade RCA => s/p BMS to RCA;  b.  Nuclear (09/14/09): Lexiscan; Inf infarct with mild peri-infarct ishemia, EF 52%; Low Risk.  Marland Kitchen DVT (deep venous thrombosis) ([place])   . Foot drop, right   . GERD (gastroesophageal reflux disease)   . Goals of care, counseling/discussion 09/30/2016  . Hypercholesterolemia   . Hypertension   . Ischemic cardiomyopathy    a. Echo (07/26/13): Mild LVH, EF 35-40%, diff HK, inf AK, Gr 2 DD, Tr AI, mildly dilated Ao root, MAC, mild MR, mild LAE, mod reduced  RVSF.  . Non-small cell carcinoma of lung ([place])    Stage IV;spinal cancer; had chemo and radiation and immune therapy  . Stroke [facility])     Past Surgical History:  Procedure Laterality Date  . ABDOMINAL HYSTERECTOMY    . APPENDECTOMY    . CATARACT EXTRACTION Bilateral   . CHOLECYSTECTOMY  2011  . CYSTOSCOPY     with stent placement  . CYSTOSCOPY W/ URETERAL STENT PLACEMENT Right 12/12/2015   Procedure: CYSTOSCOPY WITH RETROGRADE PYELOGRAM WITH STENT EXCHANGE;  Surgeon: Franchot Gallo, MD;  Location: [facility];  Service: Urology;  Laterality: Right;  . ERCP  2011   with stone extraction, done same day as cholecystectomy  . HEMATOMA EVACUATION  December 2006   groin  . HEMORROIDECTOMY    . LAMINECTOMY    . PORTACATH PLACEMENT    . TONSILLECTOMY      Social History:  reports that she quit smoking about 26 years ago. Her smoking use included cigarettes. She has a 35.00 pack-year smoking history. she has never used smokeless tobacco. She reports that she does not drink alcohol or use drugs.  Allergies: Allergies  Allergen Reactions  . Amlodipine Swelling  . Ciprofloxacin Hives and Other (See Comments)    REACTION: weakness  . Mirtazapine Other (See Comments)    Hallucinations and nightmares, verbally aggressive   . Statins Other (See  Comments)    Unknown to patient  . Aspirin Swelling    Mouth swelling & tongue Patient reports that she tolerates ibuprofen without a problem  . Cephalexin Hives  . Hydralazine Nausea And Vomiting  . Iron Nausea And Vomiting  . Ambien [Zolpidem Tartrate] Other (See Comments)    Confusion   . Eliquis [Apixaban]     Shortness of breath,blood in urine, swelling in joints  . Lorazepam Other (See Comments)    Hallucinations, verbally aggressive  . Sulfonamide Derivatives Hives  . Xarelto [Rivaroxaban]     GI bleeding  . Zolpidem     Confusion  . Latex Rash  . Penicillins Other (See Comments)    From childhood: Has patient had a PCN  reaction causing immediate rash, facial/tongue/throat swelling, SOB or lightheadedness with hypotension: Yes Has patient had a PCN reaction causing severe rash involving mucus membranes or skin necrosis: Yes Has patient had a PCN reaction that required hospitalization No Has patient had a PCN reaction occurring within the last 10 years: No If all of the above answers are "NO", then may proceed with Cephalosporin use.   . Shellfish Allergy Hives and Rash  . Tape Rash    MUST USE PAPER TAPE!!!! SKIN IS VERY THIN AND WILL TEAR EASILY!!    Family History:  Family History  Problem Relation Age of Onset  . Heart failure Mother   . Lung cancer Sister   . Lung cancer Brother   . Stomach cancer Brother   . Stroke Other   . Heart attack Neg Hx   . Colon cancer Neg Hx     Prior to Admission medications   Medication Sig Start Date End Date Taking? Authorizing Provider  acetaminophen (TYLENOL) 500 MG tablet Take 500 mg by mouth daily as needed for mild pain.   Yes [provider]  diphenoxylate-atropine (LOMOTIL) 2.5-0.025 MG tablet Take 1 tablet by mouth 4 (four) times daily as needed for diarrhea or loose stools. Reported on 11/28/2015   Yes [provider]  furosemide (LASIX) 40 MG tablet Take 40 mg by mouth daily as needed for fluid or edema.  03/15/16  Yes [provider]  gabapentin (NEURONTIN) 100 MG capsule Take 100 mg by mouth daily as needed.   Yes [provider]  loperamide (IMODIUM) 2 MG capsule Take 1 capsule (2 mg total) by mouth every 6 (six) hours as needed for diarrhea or loose stools. 07/02/15  Yes Robbie Lis, MD  metoprolol tartrate (LOPRESSOR) 25 MG tablet Take 0.5 tablets (12.5 mg total) by mouth at bedtime. Patient taking differently: Take 25 mg by mouth daily.  11/03/16  Yes Riccio, Angela C, DO  pantoprazole (PROTONIX) 40 MG tablet Take 40 mg by mouth daily before breakfast.    Yes [provider]  Pembrolizumab (KEYTRUDA IV)  Inject into the vein every 21 ( twenty-one) days.    [provider]    Review of Systems:  Constitutional: Denies fever, chills, diaphoresis, appetite change and fatigue.  HEENT: Denies photophobia, eye pain, redness, hearing loss, ear pain, congestion, sore throat, rhinorrhea, sneezing, mouth sores, trouble swallowing, neck pain, neck stiffness and tinnitus.   Respiratory: Denies SOB, DOE, cough, chest tightness,  and wheezing.   Cardiovascular: Denies chest pain, palpitations and leg swelling.  Gastrointestinal: Denies nausea, vomiting, abdominal pain, diarrhea, constipation, blood in stool and abdominal distention.  Genitourinary: Denies dysuria, urgency, frequency, hematuria, flank pain and difficulty urinating.  Endocrine: Denies: hot or cold intolerance, sweats,  changes in hair or nails, polyuria, polydipsia. Musculoskeletal: Denies myalgias, back pain, joint swelling, arthralgias and gait problem.  Skin: Denies pallor, rash and wound.  Neurological: Denies dizziness, seizures, syncope, weakness, light-headedness, numbness and headaches.  Hematological: Denies adenopathy. Easy bruising, personal or family bleeding history  Psychiatric/Behavioral: Denies suicidal ideation, mood changes, confusion, nervousness, sleep disturbance and agitation    Physical Exam: Vitals:   06/15/17 1430 06/15/17 1437 06/15/17 1530 06/15/17 1600  BP: 130/60  121/62 (!) 123/58  Pulse: (!) 46  (!) 51 (!) 50  Resp:      Temp:  (!) 92.5 F (33.6 C)    TempSrc:  Rectal    SpO2: 98%     Weight:         Constitutional: NAD, calm, comfortable Eyes: PERRL, lids and conjunctivae normal ENMT: Mucous membranes are moist. Posterior pharynx clear of any exudate or lesions.Normal dentition.  Neck: normal, supple, no masses, no thyromegaly Respiratory: clear to auscultation bilaterally, no wheezing, no crackles. Normal respiratory effort. No accessory muscle use.  Cardiovascular: Regular rate and  rhythm, no murmurs / rubs / gallops. No extremity edema. 2+ pedal pulses. No carotid bruits.  Abdomen: no tenderness, no masses palpated. No hepatosplenomegaly. Bowel sounds positive.  Musculoskeletal: no clubbing / cyanosis. No joint deformity upper and lower extremities. Good ROM, no contractures. Normal muscle tone.  Skin: no rashes, lesions, ulcers. No induration Neurologic: CN 2-12 grossly intact. Sensation intact, DTR normal. Has a right foot drop following radiation to her spine. Psychiatric: Normal judgment and insight. Alert and oriented x 3. Normal mood.    Labs on Admission: I have personally reviewed the following labs and imaging studies  CBC: Recent Labs  Lab 06/15/17 1207  WBC 2.6*  NEUTROABS 2.0  HGB 9.7*  HCT 31.7*  MCV 91.6  PLT 55*   Basic Metabolic Panel: Recent Labs  Lab 06/15/17 1207  NA 141  K 4.9  CL 109  CO2 22  GLUCOSE 116*  BUN 78*  CREATININE 4.87*  CALCIUM 7.7*   GFR: Estimated Creatinine Clearance: 8.6 mL/min (A) (by C-G formula based on SCr of 4.87 mg/dL (H)). Liver Function Tests: Recent Labs  Lab 06/15/17 1207  AST 18  ALT 10*  ALKPHOS 93  BILITOT 0.6  PROT 6.2*  ALBUMIN 3.2*   No results for input(s): LIPASE, AMYLASE in the last 168 hours. No results for input(s): AMMONIA in the last 168 hours. Coagulation Profile: No results for input(s): INR, PROTIME in the last 168 hours. Cardiac Enzymes: No results for input(s): CKTOTAL, CKMB, CKMBINDEX, TROPONINI in the last 168 hours. BNP (last 3 results) No results for input(s): PROBNP in the last 8760 hours. HbA1C: No results for input(s): HGBA1C in the last 72 hours. CBG: No results for input(s): GLUCAP in the last 168 hours. Lipid Profile: No results for input(s): CHOL, HDL, LDLCALC, TRIG, CHOLHDL, LDLDIRECT in the last 72 hours. Thyroid Function Tests: No results for input(s): TSH, T4TOTAL, FREET4, T3FREE, THYROIDAB in the last 72 hours. Anemia Panel: No results for input(s):  VITAMINB12, FOLATE, FERRITIN, TIBC, IRON, RETICCTPCT in the last 72 hours. Urine analysis:    Component Value Date/Time   COLORURINE YELLOW 06/15/2017 1310   APPEARANCEUR CLOUDY (A) 06/15/2017 1310   LABSPEC 1.010 06/15/2017 1310   LABSPEC 1.010 10/29/2016 1049   PHURINE 7.0 06/15/2017 1310   GLUCOSEU NEGATIVE 06/15/2017 1310   GLUCOSEU Negative 10/29/2016 1049   HGBUR LARGE (A) 06/15/2017 1310   BILIRUBINUR NEGATIVE 06/15/2017 1310   BILIRUBINUR  Negative 10/29/2016 1049   KETONESUR NEGATIVE 06/15/2017 1310   PROTEINUR 100 (A) 06/15/2017 1310   UROBILINOGEN 0.2 10/29/2016 1049   NITRITE POSITIVE (A) 06/15/2017 1310   LEUKOCYTESUR LARGE (A) 06/15/2017 1310   LEUKOCYTESUR Large 10/29/2016 1049   Sepsis Labs: _0 (procalcitonin:4,lacticidven:4) ) Recent Results (from the past 240 hour(s))  Blood culture (routine x 2)     Status: None (Preliminary result)   Collection Time: 06/15/17  3:21 PM  Result Value Ref Range Status   Specimen Description   Final    BLOOD RIGHT ARM BOTTLES DRAWN AEROBIC AND ANAEROBIC   Special Requests   Final    Blood Culture results may not be optimal due to an excessive volume of blood received in culture bottles   Culture PENDING  Incomplete   Report Status PENDING  Incomplete  Blood culture (routine x 2)     Status: None (Preliminary result)   Collection Time: 06/15/17  3:32 PM  Result Value Ref Range Status   Specimen Description   Final    RIGHT ANTECUBITAL BOTTLES DRAWN AEROBIC AND ANAEROBIC   Special Requests Blood Culture adequate volume  Final   Culture PENDING  Incomplete   Report Status PENDING  Incomplete     Radiological Exams on Admission: No results found.  EKG: Independently reviewed. None obtained in ED  Assessment/Plan Principal Problem:   CVA (cerebral vascular accident) ([place]) Active Problems:   Lung cancer stage IV with Metastatic squamous cell carcinoma to bone (HCC)   Catheter-associated urinary tract infection  (HCC)   ARF (acute renal failure) (HCC)   CKD (chronic kidney disease) stage 4, GFR 15-29 ml/min (HCC)    Catheter-associated UTI/Hematuria -Continue rocephin pending cx data. Despite allergy to PCN, received rocephin in ED without issues. -Despite hypothermia, will not call this sepsis given how stable she is. This is believed to be a simple UTI associated with a chronic indwelling foley catheter. Lactic acid is WNL. -Anticipate hematuria to resolve as we start treating UTI. -For her hypothermia, will continue with warming blanket.  Acute on CKD Stage IV-V -Baseline Cr is 3.9 and is 4.87 on admission. -Likely some degree of postobstructive due to blood clots. Now urine is draining. -recheck renal function in am.  Stage IV Lung Cancer -Continue OP follow up with oncology as scheduled.  Chronic Combined CHF -Echo 6/18: EF 40-45% with grade 1 DD. -Appears compensated at present. -Will hold off on aggressive IVF as I do not believe she needs them presently.    DVT prophylaxis: SCDs  Code Status: Full Code  Family Communication: Daughter at bedside updated on plan of care and all questions answered.  Disposition Plan: Anticipate DC home in 24 hours  Consults called: None  Admission status: Observation. Based on current presentation I believe patient will be discharged in less than 2 midnights    Time Spent: 8 minutes  Kaizer Dissinger Isaac Bliss MD [facility] Pager [phone]  If 7PM-7AM, please contact night-coverage [email or web address] Password [facility]  06/15/2017, 5:02 PM

## 2017-06-16 ENCOUNTER — Ambulatory Visit: Payer: Medicare Other | Admitting: Cardiovascular Disease

## 2017-06-16 DIAGNOSIS — T83510D Infection and inflammatory reaction due to cystostomy catheter, subsequent encounter: Secondary | ICD-10-CM

## 2017-06-16 LAB — CBC
HEMATOCRIT: 32.7 % — AB (ref 36.0–46.0)
Hemoglobin: 9.7 g/dL — ABNORMAL LOW (ref 12.0–15.0)
MCH: 27.6 pg (ref 26.0–34.0)
MCHC: 29.7 g/dL — ABNORMAL LOW (ref 30.0–36.0)
MCV: 93.2 fL (ref 78.0–100.0)
Platelets: 58 10*3/uL — ABNORMAL LOW (ref 150–400)
RBC: 3.51 MIL/uL — ABNORMAL LOW (ref 3.87–5.11)
RDW: 17.2 % — AB (ref 11.5–15.5)
WBC: 2.1 10*3/uL — ABNORMAL LOW (ref 4.0–10.5)

## 2017-06-16 LAB — BASIC METABOLIC PANEL
Anion gap: 10 (ref 5–15)
BUN: 75 mg/dL — ABNORMAL HIGH (ref 6–20)
CHLORIDE: 108 mmol/L (ref 101–111)
CO2: 19 mmol/L — ABNORMAL LOW (ref 22–32)
Calcium: 7.6 mg/dL — ABNORMAL LOW (ref 8.9–10.3)
Creatinine, Ser: 4.98 mg/dL — ABNORMAL HIGH (ref 0.44–1.00)
GFR calc Af Amer: 9 mL/min — ABNORMAL LOW (ref >60)
GFR calc non Af Amer: 7 mL/min — ABNORMAL LOW (ref >60)
Glucose, Bld: 276 mg/dL — ABNORMAL HIGH (ref 65–99)
POTASSIUM: 5.8 mmol/L — AB (ref 3.5–5.1)
SODIUM: 137 mmol/L (ref 135–145)

## 2017-06-16 MED ORDER — SODIUM CHLORIDE 0.9 % IV SOLN
INTRAVENOUS | Status: AC
  Administered 2017-06-16: 15:00:00 via INTRAVENOUS

## 2017-06-16 NOTE — Progress Notes (Signed)
PROGRESS NOTE    [name]  BOF:[id number] DOB: 1933-09-26 DOA: 06/15/2017 PCP: Celene Squibb, MD     Brief Narrative:  84 year old woman admitted from home on 1/13 with complaints of hematuria.  Was found to be hypothermic and admission was requested.   Assessment & Plan:   Principal Problem:   Catheter-associated urinary tract infection (HCC) Active Problems:   Lung cancer stage IV with Metastatic squamous cell carcinoma to bone (HCC)   CVA (cerebral vascular accident) ([place])   ARF (acute renal failure) (HCC)   CKD (chronic kidney disease) stage 4, GFR 15-29 ml/min (HCC)   Catheter associated UTI -Continue Rocephin pending culture data.  Hematuria -Presumed due to UTI, improving/resolved.  Hypothermia -Presumed due to UTI, improving with warming blanket and antibiotics.  Stage IV lung cancer -Continue outpatient follow-up with oncology as scheduled.  Acute on chronic kidney disease stage IV-V -Baseline creatinine is around 3.9 and is 4.9 today. -We will give some gentle IV fluids today and reassess renal function in a.m.  Chronic combined CHF -Echo in June 2018 shows an ejection fraction of 40-45% with grade 1 diastolic dysfunction. -Appears compensated at present.   DVT prophylaxis: SCDs Code Status: Full code Family Communication: Daughter at bedside Disposition Plan: Anticipate discharge home in 24 hours  Consultants:   None  Procedures:   None  Antimicrobials:  Anti-infectives (From admission, onward)   Start     Dose/Rate Route Frequency Ordered Stop   06/16/17 1600  cefTRIAXone (ROCEPHIN) 1 g in dextrose 5 % 50 mL IVPB     1 g 100 mL/hr over 30 Minutes Intravenous Every 24 hours 06/15/17 1833     06/15/17 1515  cefTRIAXone (ROCEPHIN) 1 g in dextrose 5 % 50 mL IVPB     1 g Intravenous  Once 06/15/17 1510 06/15/17 1840       Subjective: Lying in bed, resting peacefully, per daughter no acute events/complaints  Objective: Vitals:   06/15/17 1820 06/15/17 2005 06/15/17 2314 06/16/17 0635  BP: 115/64 120/82  130/61  Pulse: 70 63  69  Resp: 20 20  20   Temp: (!) 94.4 F (34.7 C) (!) 95.6 F (35.3 C) 98 F (36.7 C) (!) 96 F (35.6 C)  TempSrc: Rectal Rectal Rectal Rectal  SpO2: 95% 97%  99%  Weight: 60.7 kg (133 lb 13.1 oz)     Height: 5\' 8"  (1.727 m)       Intake/Output Summary (Last 24 hours) at 06/16/2017 1427 Last data filed at 06/15/2017 1841 Gross per 24 hour  Intake 50 ml  Output -  Net 50 ml   Filed Weights   06/15/17 1138 06/15/17 1820  Weight: 59.9 kg (132 lb) 60.7 kg (133 lb 13.1 oz)    Examination:  General exam: Alert, awake, oriented x 3 Respiratory system: Clear to auscultation. Respiratory effort normal. Cardiovascular system:RRR. No murmurs, rubs, gallops. Gastrointestinal system: Abdomen is nondistended, soft and nontender. No organomegaly or masses felt. Normal bowel sounds heard. Central nervous system: Alert and oriented. No focal neurological deficits. Extremities: No C/C/E, +pedal pulses Skin: No rashes, lesions or ulcers Psychiatry: Judgement and insight appear normal. Mood & affect appropriate.     Data Reviewed: I have personally reviewed following labs and imaging studies  CBC: Recent Labs  Lab 06/15/17 1207 06/16/17 0440  WBC 2.6* 2.1*  NEUTROABS 2.0  --   HGB 9.7* 9.7*  HCT 31.7* 32.7*  MCV 91.6 93.2  PLT 55* 58*   Basic Metabolic Panel:  Recent Labs  Lab 06/15/17 1207 06/16/17 0440  NA 141 137  K 4.9 5.8*  CL 109 108  CO2 22 19*  GLUCOSE 116* 276*  BUN 78* 75*  CREATININE 4.87* 4.98*  CALCIUM 7.7* 7.6*   GFR: Estimated Creatinine Clearance: 8.5 mL/min (A) (by C-G formula based on SCr of 4.98 mg/dL (H)). Liver Function Tests: Recent Labs  Lab 06/15/17 1207  AST 18  ALT 10*  ALKPHOS 93  BILITOT 0.6  PROT 6.2*  ALBUMIN 3.2*   No results for input(s): LIPASE, AMYLASE in the last 168 hours. No results for input(s): AMMONIA in the last 168  hours. Coagulation Profile: No results for input(s): INR, PROTIME in the last 168 hours. Cardiac Enzymes: No results for input(s): CKTOTAL, CKMB, CKMBINDEX, TROPONINI in the last 168 hours. BNP (last 3 results) No results for input(s): PROBNP in the last 8760 hours. HbA1C: No results for input(s): HGBA1C in the last 72 hours. CBG: No results for input(s): GLUCAP in the last 168 hours. Lipid Profile: No results for input(s): CHOL, HDL, LDLCALC, TRIG, CHOLHDL, LDLDIRECT in the last 72 hours. Thyroid Function Tests: No results for input(s): TSH, T4TOTAL, FREET4, T3FREE, THYROIDAB in the last 72 hours. Anemia Panel: No results for input(s): VITAMINB12, FOLATE, FERRITIN, TIBC, IRON, RETICCTPCT in the last 72 hours. Urine analysis:    Component Value Date/Time   COLORURINE YELLOW 06/15/2017 1310   APPEARANCEUR CLOUDY (A) 06/15/2017 1310   LABSPEC 1.010 06/15/2017 1310   LABSPEC 1.010 10/29/2016 1049   PHURINE 7.0 06/15/2017 1310   GLUCOSEU NEGATIVE 06/15/2017 1310   GLUCOSEU Negative 10/29/2016 1049   HGBUR LARGE (A) 06/15/2017 1310   BILIRUBINUR NEGATIVE 06/15/2017 1310   BILIRUBINUR Negative 10/29/2016 1049   KETONESUR NEGATIVE 06/15/2017 1310   PROTEINUR 100 (A) 06/15/2017 1310   UROBILINOGEN 0.2 10/29/2016 1049   NITRITE POSITIVE (A) 06/15/2017 1310   LEUKOCYTESUR LARGE (A) 06/15/2017 1310   LEUKOCYTESUR Large 10/29/2016 1049   Sepsis Labs: @LABRCNTIP (procalcitonin:4,lacticidven:4)  ) Recent Results (from the past 240 hour(s))  Blood culture (routine x 2)     Status: None (Preliminary result)   Collection Time: 06/15/17  3:21 PM  Result Value Ref Range Status   Specimen Description   Final    BLOOD RIGHT ARM BOTTLES DRAWN AEROBIC AND ANAEROBIC   Special Requests   Final    Blood Culture results may not be optimal due to an excessive volume of blood received in culture bottles   Culture NO GROWTH < 24 HOURS  Final   Report Status PENDING  Incomplete  Blood culture  (routine x 2)     Status: None (Preliminary result)   Collection Time: 06/15/17  3:32 PM  Result Value Ref Range Status   Specimen Description   Final    RIGHT ANTECUBITAL BOTTLES DRAWN AEROBIC AND ANAEROBIC   Special Requests Blood Culture adequate volume  Final   Culture NO GROWTH < 24 HOURS  Final   Report Status PENDING  Incomplete         Radiology Studies: No results found.      Scheduled Meds: . gabapentin  100 mg Oral Daily  . metoprolol tartrate  25 mg Oral Daily  . pantoprazole  40 mg Oral QAC breakfast  . sodium chloride flush  3 mL Intravenous Q12H   Continuous Infusions: . sodium chloride    . cefTRIAXone (ROCEPHIN)  IV       LOS: 0 days    Time spent: 25 minutes. Greater than  50% of this time was spent in direct contact with the patient coordinating care.     Lelon Frohlich, MD [facility] Pager [phone]  If 7PM-7AM, please contact night-coverage [email or web address] Password [facility] 06/16/2017, 2:27 PM

## 2017-06-16 NOTE — Care Management Note (Signed)
Case Management Note  Patient Details  Name: [name] MRN: [id number] Date of Birth: 30-Jul-1933  Subjective/Objective:       Adm with UTI, has chronic foley. From home alone. Hx of lung cancer with metastasis to spine.  Daughter checks on patient daily. Son in law takes patient meals while daughter is working. Patient is only able to do transfers from bed to [facility] per daughter. Daughter states patient is active with [facility] for RN and aide services. States she was able to walk when PT was working with patient, but [facility] Cab[facility] crew as she was not "making progress". Daughter states that since that, patient has become very scared of falling and now will only do transfers and has become weaker. We discuss Medicaid for potential future needs of LTC. Daughter shares that patient does not qualify as she brings in too much income, (?around $1400/per month), daughter also states that she has told patient that she would always keep her at home per patient wish. Patient does not have home oxygen. Explained to daughter qualifications needed for insurance to cover oxygen. Patient does have hospital bed, WC, RW and BSC at home.       Action/Plan:Anticipate DC home in care of family with continuation of home health.    Expected Discharge Date:  06/18/17               Expected Discharge Plan:  [place]  In-House Referral:     Discharge planning Services  CM Consult  Post Acute Care Choice:  Home Health, Resumption of Svcs/PTA Provider Choice offered to:  Patient, Adult Children  DME Arranged:    DME Agency:     HH Arranged:  RN, Nurse's Aide [place] Agency:  [facility] (formerly [employer])  Status of Service:  In process, will continue to follow  If discussed at Long Length of Stay Meetings, dates discussed:    Additional Comments:  Jarvin Ogren, Chauncey [place], RN 06/16/2017, 11:39 AM

## 2017-06-16 NOTE — Care Management Obs Status (Signed)
[place] NOTIFICATION   Patient Details  Name: [name] MRN: [id number] Date of Birth: [birth year]   Medicare Observation Status Notification Given:  Yes    Emeri Estill, Chauncey [place], RN 06/16/2017, 11:32 AM

## 2017-06-17 DIAGNOSIS — I340 Nonrheumatic mitral (valve) insufficiency: Secondary | ICD-10-CM | POA: Diagnosis present

## 2017-06-17 DIAGNOSIS — E861 Hypovolemia: Secondary | ICD-10-CM | POA: Diagnosis present

## 2017-06-17 DIAGNOSIS — T83511A Infection and inflammatory reaction due to indwelling urethral catheter, initial encounter: Secondary | ICD-10-CM

## 2017-06-17 DIAGNOSIS — I361 Nonrheumatic tricuspid (valve) insufficiency: Secondary | ICD-10-CM | POA: Diagnosis not present

## 2017-06-17 DIAGNOSIS — F05 Delirium due to known physiological condition: Secondary | ICD-10-CM | POA: Diagnosis not present

## 2017-06-17 DIAGNOSIS — N170 Acute kidney failure with tubular necrosis: Secondary | ICD-10-CM

## 2017-06-17 DIAGNOSIS — I255 Ischemic cardiomyopathy: Secondary | ICD-10-CM | POA: Diagnosis present

## 2017-06-17 DIAGNOSIS — N179 Acute kidney failure, unspecified: Secondary | ICD-10-CM | POA: Diagnosis not present

## 2017-06-17 DIAGNOSIS — I132 Hypertensive heart and chronic kidney disease with heart failure and with stage 5 chronic kidney disease, or end stage renal disease: Secondary | ICD-10-CM | POA: Diagnosis present

## 2017-06-17 DIAGNOSIS — C3490 Malignant neoplasm of unspecified part of unspecified bronchus or lung: Secondary | ICD-10-CM | POA: Diagnosis present

## 2017-06-17 DIAGNOSIS — L8990 Pressure ulcer of unspecified site, unspecified stage: Secondary | ICD-10-CM | POA: Diagnosis present

## 2017-06-17 DIAGNOSIS — Z7401 Bed confinement status: Secondary | ICD-10-CM | POA: Diagnosis not present

## 2017-06-17 DIAGNOSIS — I5043 Acute on chronic combined systolic (congestive) and diastolic (congestive) heart failure: Secondary | ICD-10-CM | POA: Diagnosis not present

## 2017-06-17 DIAGNOSIS — N029 Recurrent and persistent hematuria with unspecified morphologic changes: Secondary | ICD-10-CM | POA: Diagnosis present

## 2017-06-17 DIAGNOSIS — D696 Thrombocytopenia, unspecified: Secondary | ICD-10-CM | POA: Diagnosis present

## 2017-06-17 DIAGNOSIS — N390 Urinary tract infection, site not specified: Secondary | ICD-10-CM | POA: Diagnosis present

## 2017-06-17 DIAGNOSIS — N184 Chronic kidney disease, stage 4 (severe): Secondary | ICD-10-CM

## 2017-06-17 DIAGNOSIS — R279 Unspecified lack of coordination: Secondary | ICD-10-CM | POA: Diagnosis not present

## 2017-06-17 DIAGNOSIS — T83510D Infection and inflammatory reaction due to cystostomy catheter, subsequent encounter: Secondary | ICD-10-CM | POA: Diagnosis not present

## 2017-06-17 DIAGNOSIS — T83518A Infection and inflammatory reaction due to other urinary catheter, initial encounter: Secondary | ICD-10-CM | POA: Diagnosis present

## 2017-06-17 DIAGNOSIS — Z5189 Encounter for other specified aftercare: Secondary | ICD-10-CM | POA: Diagnosis not present

## 2017-06-17 DIAGNOSIS — D631 Anemia in chronic kidney disease: Secondary | ICD-10-CM | POA: Diagnosis present

## 2017-06-17 DIAGNOSIS — Z7189 Other specified counseling: Secondary | ICD-10-CM | POA: Diagnosis not present

## 2017-06-17 DIAGNOSIS — E875 Hyperkalemia: Secondary | ICD-10-CM | POA: Diagnosis not present

## 2017-06-17 DIAGNOSIS — T83511D Infection and inflammatory reaction due to indwelling urethral catheter, subsequent encounter: Secondary | ICD-10-CM | POA: Diagnosis not present

## 2017-06-17 DIAGNOSIS — Z66 Do not resuscitate: Secondary | ICD-10-CM | POA: Diagnosis not present

## 2017-06-17 DIAGNOSIS — I639 Cerebral infarction, unspecified: Secondary | ICD-10-CM | POA: Diagnosis not present

## 2017-06-17 DIAGNOSIS — C7951 Secondary malignant neoplasm of bone: Secondary | ICD-10-CM | POA: Diagnosis present

## 2017-06-17 DIAGNOSIS — G9341 Metabolic encephalopathy: Secondary | ICD-10-CM | POA: Diagnosis not present

## 2017-06-17 DIAGNOSIS — I4891 Unspecified atrial fibrillation: Secondary | ICD-10-CM | POA: Diagnosis present

## 2017-06-17 DIAGNOSIS — N185 Chronic kidney disease, stage 5: Secondary | ICD-10-CM | POA: Diagnosis present

## 2017-06-17 DIAGNOSIS — I351 Nonrheumatic aortic (valve) insufficiency: Secondary | ICD-10-CM | POA: Diagnosis not present

## 2017-06-17 DIAGNOSIS — I509 Heart failure, unspecified: Secondary | ICD-10-CM | POA: Diagnosis not present

## 2017-06-17 DIAGNOSIS — Z515 Encounter for palliative care: Secondary | ICD-10-CM | POA: Diagnosis not present

## 2017-06-17 DIAGNOSIS — E872 Acidosis: Secondary | ICD-10-CM | POA: Diagnosis not present

## 2017-06-17 DIAGNOSIS — Y846 Urinary catheterization as the cause of abnormal reaction of the patient, or of later complication, without mention of misadventure at the time of the procedure: Secondary | ICD-10-CM | POA: Diagnosis present

## 2017-06-17 DIAGNOSIS — N3090 Cystitis, unspecified without hematuria: Secondary | ICD-10-CM | POA: Diagnosis present

## 2017-06-17 DIAGNOSIS — E871 Hypo-osmolality and hyponatremia: Secondary | ICD-10-CM | POA: Diagnosis not present

## 2017-06-17 DIAGNOSIS — I959 Hypotension, unspecified: Secondary | ICD-10-CM | POA: Diagnosis present

## 2017-06-17 DIAGNOSIS — N3091 Cystitis, unspecified with hematuria: Secondary | ICD-10-CM | POA: Diagnosis present

## 2017-06-17 LAB — URINE CULTURE

## 2017-06-17 LAB — BASIC METABOLIC PANEL
ANION GAP: 11 (ref 5–15)
BUN: 81 mg/dL — AB (ref 6–20)
CALCIUM: 7.6 mg/dL — AB (ref 8.9–10.3)
CO2: 17 mmol/L — ABNORMAL LOW (ref 22–32)
Chloride: 107 mmol/L (ref 101–111)
Creatinine, Ser: 5.44 mg/dL — ABNORMAL HIGH (ref 0.44–1.00)
GFR calc Af Amer: 8 mL/min — ABNORMAL LOW (ref >60)
GFR, EST NON AFRICAN AMERICAN: 7 mL/min — AB (ref >60)
Glucose, Bld: 106 mg/dL — ABNORMAL HIGH (ref 65–99)
Potassium: 5.1 mmol/L (ref 3.5–5.1)
SODIUM: 135 mmol/L (ref 135–145)

## 2017-06-17 LAB — CBC
HCT: 32.1 % — ABNORMAL LOW (ref 36.0–46.0)
Hemoglobin: 9.6 g/dL — ABNORMAL LOW (ref 12.0–15.0)
MCH: 28.4 pg (ref 26.0–34.0)
MCHC: 29.9 g/dL — ABNORMAL LOW (ref 30.0–36.0)
MCV: 95 fL (ref 78.0–100.0)
PLATELETS: 47 10*3/uL — AB (ref 150–400)
RBC: 3.38 MIL/uL — AB (ref 3.87–5.11)
RDW: 17.4 % — AB (ref 11.5–15.5)
WBC: 3.9 10*3/uL — AB (ref 4.0–10.5)

## 2017-06-17 MED ORDER — FUROSEMIDE 10 MG/ML IJ SOLN
40.0000 mg | Freq: Once | INTRAMUSCULAR | Status: AC
  Administered 2017-06-17: 40 mg via INTRAVENOUS
  Filled 2017-06-17: qty 4

## 2017-06-17 NOTE — Progress Notes (Signed)
Pt's family report that pt takes Lasix 40 mg in AM. Pt's output since 0700 is 69ml. Paged Dr. Jerilee Hoh.

## 2017-06-17 NOTE — Progress Notes (Signed)
RN came in to given Lasix and notice pts BP 93/44, manual check said it to be 100/50. Dr. Hilbert Bible paged to see if she wanted Lasix to be given. Waiting for call back

## 2017-06-17 NOTE — Progress Notes (Signed)
After speaking with Dr. Hilbert Bible, she advised me to hold the Lasix d/t low blood pressure. RN adv her of 14mL urine output, low WBC count as well as continues to be on warming blanket. Dr. Hilbert Bible stated nephrology would see her in the morning. CN made aware of pt.  Will continue to monitor

## 2017-06-17 NOTE — Progress Notes (Signed)
Removed pt's (pre hospital) 18 french foley d/t suspected clog. Unable to reinsert 18Fr. Inserted 16 french foley. No urine returned. Catheter appears to be correctly placed. Bladder scanned pt to find only 40 mL's of urine. Dr Jerilee Hoh notified.

## 2017-06-17 NOTE — Progress Notes (Addendum)
PROGRESS NOTE    [name]  BJS:[id number] DOB: Oct 09, 1933 DOA: 06/15/2017 PCP: Celene Squibb, MD     Brief Narrative:  84 year old woman admitted from home on 1/13 with complaints of hematuria.  Was found to be hypothermic and admission was requested.  Today has been noted to have decreased urine output, increased lethargy, tense suprapubic area.   Assessment & Plan:   Principal Problem:   Catheter-associated urinary tract infection (HCC) Active Problems:   Lung cancer stage IV with Metastatic squamous cell carcinoma to bone (HCC)   CVA (cerebral vascular accident) ([place])   ARF (acute renal failure) (HCC)   CKD (chronic kidney disease) stage 4, GFR 15-29 ml/min (HCC)   Pressure injury of skin   Catheter associated UTI -Continue Rocephin pending culture data. -Today she has been noted to have decreased urine output, on physical exam she has a distended suprapubic area.  I suspect her catheter is no longer draining.  Have requested RN exchange her Foley catheter.  Hematuria -Presumed due to UTI in addition to thrombocytopenia, improving/resolved.  Hypothermia -Presumed due to UTI, improving with warming blanket and antibiotics.  Thrombocytopenia -On chart review, has chronic thrombocytopenia with platelet count around 150, since admission her platelet count has been in the 40s-50s. -She is not currently on heparin products. -I did discuss case with Rulon Abide, nurse practitioner with oncology who in turn discussed with Dr. Lebron Conners, her supervising oncologist.  They believe possible etiologies to be related to her acute infection and antibiotic usage. -As she has not had a significant bleed and the platelet count remains above 10,000, they are not recommending platelet transfusion at this time, recommendations are for observation.  Acute metabolic encephalopathy -This is new from yesterday. -Patient's daughter notes that gabapentin that was on her med rec list is not  accurate and she in fact has not taken gabapentin in months.  It is possible that encephalopathy is due to this, it could also be due to her worsening renal function with retention. -Gabapentin will be discontinued, continue to monitor.  Stage IV lung cancer -Continue outpatient follow-up with oncology as scheduled. -She sees Dr. Julien Nordmann in [place].  Acute on chronic kidney disease stage IV-V -Baseline creatinine is around 3.9 and up to 5.44 today. -I suspect acute component is mostly related to postobstructive causes, have asked RN to replace Foley catheter. -Lasix has been on hold since admission due to acute on chronic kidney failure. -Has been on gentle hydration with IVF, however I have discontinued today given significant edema. -Given worsening of creatinine and now metabolic acidosis, will request nephrology consultation.  Acute on chronic combined CHF -Echo in June 2018 shows an ejection fraction of 40-45% with grade 1 diastolic dysfunction. -With increase in edema today after IV fluids given yesterday for renal dysfunction. -Despite increase in creatinine, will go ahead and give her 1 dose of 40 mg of IV Lasix.  Further diuretic dosing as per nephrology recommendations.   DVT prophylaxis: SCDs Code Status: Full code Family Communication: Daughter at bedside Disposition Plan: Pending medical improvement.  Consultants:   None  Procedures:   None  Antimicrobials:  Anti-infectives (From admission, onward)   Start     Dose/Rate Route Frequency Ordered Stop   06/16/17 1600  cefTRIAXone (ROCEPHIN) 1 g in dextrose 5 % 50 mL IVPB     1 g 100 mL/hr over 30 Minutes Intravenous Every 24 hours 06/15/17 1833     06/15/17 1515  cefTRIAXone (ROCEPHIN) 1 g in  dextrose 5 % 50 mL IVPB     1 g Intravenous  Once 06/15/17 1510 06/15/17 1840       Subjective: Lying in bed, drowsy, difficult to arouse.  Objective: Vitals:   06/16/17 2152 06/16/17 2202 06/17/17 0417 06/17/17  1547  BP: (!) 101/56  (!) 101/52 (!) 90/44  Pulse: 63  72 75  Resp: (!) 21  18 18   Temp: (!) 96.6 F (35.9 C)  (!) 96 F (35.6 C) 97.7 F (36.5 C)  TempSrc: Rectal  Rectal   SpO2: 99% 98% 96% 100%  Weight:      Height:        Intake/Output Summary (Last 24 hours) at 06/17/2017 1721 Last data filed at 06/17/2017 1500 Gross per 24 hour  Intake 1475 ml  Output 303 ml  Net 1172 ml   Filed Weights   06/15/17 1138 06/15/17 1820  Weight: 59.9 kg (132 lb) 60.7 kg (133 lb 13.1 oz)    Examination:  General exam: Drowsy, difficult to arouse, increase edema since yesterday, especially over her right arm which is where her IV has been placed Respiratory system: Clear to auscultation. Respiratory effort normal. Cardiovascular system:RRR. No murmurs, rubs, gallops. Gastrointestinal system: Abdomen is distended to the suprapubic area, patient grimaces when suprapubic area is pressed.  Normal bowel sounds. Central nervous system: Unable to fully assess given current mental state Extremities: 2+ pitting edema bilaterally, most significant over right upper extremity where IV has been placed Skin: No rashes, lesions or ulcers Psychiatry: Unable to fully assess given current mental state    Data Reviewed: I have personally reviewed following labs and imaging studies  CBC: Recent Labs  Lab 06/15/17 1207 06/16/17 0440 06/17/17 1408  WBC 2.6* 2.1* 3.9*  NEUTROABS 2.0  --   --   HGB 9.7* 9.7* 9.6*  HCT 31.7* 32.7* 32.1*  MCV 91.6 93.2 95.0  PLT 55* 58* 47*   Basic Metabolic Panel: Recent Labs  Lab 06/15/17 1207 06/16/17 0440 06/17/17 1408  NA 141 137 135  K 4.9 5.8* 5.1  CL 109 108 107  CO2 22 19* 17*  GLUCOSE 116* 276* 106*  BUN 78* 75* 81*  CREATININE 4.87* 4.98* 5.44*  CALCIUM 7.7* 7.6* 7.6*   GFR: Estimated Creatinine Clearance: 7.8 mL/min (A) (by C-G formula based on SCr of 5.44 mg/dL (H)). Liver Function Tests: Recent Labs  Lab 06/15/17 1207  AST 18  ALT 10*    ALKPHOS 93  BILITOT 0.6  PROT 6.2*  ALBUMIN 3.2*   No results for input(s): LIPASE, AMYLASE in the last 168 hours. No results for input(s): AMMONIA in the last 168 hours. Coagulation Profile: No results for input(s): INR, PROTIME in the last 168 hours. Cardiac Enzymes: No results for input(s): CKTOTAL, CKMB, CKMBINDEX, TROPONINI in the last 168 hours. BNP (last 3 results) No results for input(s): PROBNP in the last 8760 hours. HbA1C: No results for input(s): HGBA1C in the last 72 hours. CBG: No results for input(s): GLUCAP in the last 168 hours. Lipid Profile: No results for input(s): CHOL, HDL, LDLCALC, TRIG, CHOLHDL, LDLDIRECT in the last 72 hours. Thyroid Function Tests: No results for input(s): TSH, T4TOTAL, FREET4, T3FREE, THYROIDAB in the last 72 hours. Anemia Panel: No results for input(s): VITAMINB12, FOLATE, FERRITIN, TIBC, IRON, RETICCTPCT in the last 72 hours. Urine analysis:    Component Value Date/Time   COLORURINE YELLOW 06/15/2017 1310   APPEARANCEUR CLOUDY (A) 06/15/2017 1310   LABSPEC 1.010 06/15/2017 1310   LABSPEC  1.010 10/29/2016 1049   PHURINE 7.0 06/15/2017 1310   GLUCOSEU NEGATIVE 06/15/2017 1310   GLUCOSEU Negative 10/29/2016 1049   HGBUR LARGE (A) 06/15/2017 1310   BILIRUBINUR NEGATIVE 06/15/2017 1310   BILIRUBINUR Negative 10/29/2016 1049   KETONESUR NEGATIVE 06/15/2017 1310   PROTEINUR 100 (A) 06/15/2017 1310   UROBILINOGEN 0.2 10/29/2016 1049   NITRITE POSITIVE (A) 06/15/2017 1310   LEUKOCYTESUR LARGE (A) 06/15/2017 1310   LEUKOCYTESUR Large 10/29/2016 1049   Sepsis Labs: @LABRCNTIP (procalcitonin:4,lacticidven:4)  ) Recent Results (from the past 240 hour(s))  Urine Culture     Status: Abnormal   Collection Time: 06/15/17  1:10 PM  Result Value Ref Range Status   Specimen Description URINE, CLEAN CATCH  Final   Special Requests NONE  Final   Culture MULTIPLE SPECIES PRESENT, SUGGEST RECOLLECTION (A)  Final   Report Status 06/17/2017  FINAL  Final  Blood culture (routine x 2)     Status: None (Preliminary result)   Collection Time: 06/15/17  3:21 PM  Result Value Ref Range Status   Specimen Description   Final    BLOOD RIGHT ARM BOTTLES DRAWN AEROBIC AND ANAEROBIC   Special Requests   Final    Blood Culture results may not be optimal due to an excessive volume of blood received in culture bottles   Culture NO GROWTH 2 DAYS  Final   Report Status PENDING  Incomplete  Blood culture (routine x 2)     Status: None (Preliminary result)   Collection Time: 06/15/17  3:32 PM  Result Value Ref Range Status   Specimen Description   Final    RIGHT ANTECUBITAL BOTTLES DRAWN AEROBIC AND ANAEROBIC   Special Requests Blood Culture adequate volume  Final   Culture NO GROWTH 2 DAYS  Final   Report Status PENDING  Incomplete         Radiology Studies: No results found.      Scheduled Meds: . metoprolol tartrate  25 mg Oral Daily  . pantoprazole  40 mg Oral QAC breakfast  . sodium chloride flush  3 mL Intravenous Q12H   Continuous Infusions: . sodium chloride    . cefTRIAXone (ROCEPHIN)  IV Stopped (06/16/17 1508)     LOS: 0 days    Time spent: 35 minutes. Greater than 50% of this time was spent in direct contact with the patient coordinating care.     Lelon Frohlich, MD [facility] Pager [phone]  If 7PM-7AM, please contact night-coverage [email or web address] Password [facility] 06/17/2017, 5:21 PM

## 2017-06-18 ENCOUNTER — Inpatient Hospital Stay (HOSPITAL_COMMUNITY): Payer: Medicare Other

## 2017-06-18 ENCOUNTER — Encounter (HOSPITAL_COMMUNITY): Payer: Self-pay | Admitting: Primary Care

## 2017-06-18 DIAGNOSIS — Z515 Encounter for palliative care: Secondary | ICD-10-CM

## 2017-06-18 DIAGNOSIS — I361 Nonrheumatic tricuspid (valve) insufficiency: Secondary | ICD-10-CM

## 2017-06-18 DIAGNOSIS — I351 Nonrheumatic aortic (valve) insufficiency: Secondary | ICD-10-CM

## 2017-06-18 DIAGNOSIS — Z7189 Other specified counseling: Secondary | ICD-10-CM

## 2017-06-18 LAB — ECHOCARDIOGRAM COMPLETE
AO mean calculated velocity dopler: 124 cm/s
AV Area VTI index: 0.72 cm2/m2
AV Area VTI: 1.24 cm2
AV Area mean vel: 1.23 cm2
AV Mean grad: 7 mmHg
AV Peak grad: 12 mmHg
AV VEL mean LVOT/AV: 0.39
AV area mean vel ind: 0.72 cm2/m2
AV peak Index: 0.73
AV pk vel: 171 cm/s
AV vel: 1.22
Ao pk vel: 0.4 m/s
E decel time: 292 msec
E/e' ratio: 14.07
FS: 25 % — AB (ref 28–44)
Height: 68 in
IVS/LV PW RATIO, ED: 0.98
LA ID, A-P, ES: 31 mm
LA diam end sys: 31 mm
LA diam index: 1.82 cm/m2
LA vol A4C: 70.3 ml
LA vol index: 35 mL/m2
LA vol: 59.5 mL
LV E/e' medial: 14.07
LV E/e'average: 14.07
LV PW d: 12.9 mm — AB (ref 0.6–1.1)
LV dias vol index: 46 mL/m2
LV dias vol: 78 mL (ref 46–106)
LV e' LATERAL: 6 cm/s
LV sys vol index: 24 mL/m2
LV sys vol: 40 mL (ref 14–42)
LVOT SV: 54 mL
LVOT VTI: 17.3 cm
LVOT area: 3.14 cm2
LVOT diameter: 20 mm
LVOT peak VTI: 0.39 cm
LVOT peak grad rest: 2 mmHg
LVOT peak vel: 67.8 cm/s
Lateral S' vel: 5.48 cm/s
MV Dec: 292
MV Peak grad: 3 mmHg
MV pk A vel: 88.6 m/s
MV pk E vel: 84.4 m/s
Reg peak vel: 236 cm/s
Simpson's disk: 48
Stroke v: 38 ml
TAPSE: 14 mm
TDI e' lateral: 6
TR max vel: 236 cm/s
VTI: 44.4 cm
Valve area index: 0.72
Valve area: 1.22 cm2
Weight: 2141.11 oz

## 2017-06-18 LAB — BASIC METABOLIC PANEL
Anion gap: 11 (ref 5–15)
BUN: 83 mg/dL — AB (ref 6–20)
CHLORIDE: 105 mmol/L (ref 101–111)
CO2: 18 mmol/L — ABNORMAL LOW (ref 22–32)
Calcium: 7.8 mg/dL — ABNORMAL LOW (ref 8.9–10.3)
Creatinine, Ser: 5.61 mg/dL — ABNORMAL HIGH (ref 0.44–1.00)
GFR calc non Af Amer: 6 mL/min — ABNORMAL LOW (ref >60)
GFR, EST AFRICAN AMERICAN: 7 mL/min — AB (ref >60)
Glucose, Bld: 98 mg/dL (ref 65–99)
POTASSIUM: 5.3 mmol/L — AB (ref 3.5–5.1)
SODIUM: 134 mmol/L — AB (ref 135–145)

## 2017-06-18 LAB — CBC
HEMATOCRIT: 31.4 % — AB (ref 36.0–46.0)
HEMOGLOBIN: 9.4 g/dL — AB (ref 12.0–15.0)
MCH: 28.4 pg (ref 26.0–34.0)
MCHC: 29.9 g/dL — ABNORMAL LOW (ref 30.0–36.0)
MCV: 94.9 fL (ref 78.0–100.0)
Platelets: 50 10*3/uL — ABNORMAL LOW (ref 150–400)
RBC: 3.31 MIL/uL — AB (ref 3.87–5.11)
RDW: 17.3 % — ABNORMAL HIGH (ref 11.5–15.5)
WBC: 3.6 10*3/uL — AB (ref 4.0–10.5)

## 2017-06-18 LAB — CREATININE, URINE, RANDOM: Creatinine, Urine: 168.63 mg/dL

## 2017-06-18 LAB — SODIUM, URINE, RANDOM: Sodium, Ur: 44 mmol/L

## 2017-06-18 MED ORDER — FUROSEMIDE 10 MG/ML IJ SOLN
40.0000 mg | Freq: Two times a day (BID) | INTRAMUSCULAR | Status: AC
  Administered 2017-06-18 (×2): 40 mg via INTRAVENOUS
  Filled 2017-06-18 (×2): qty 4

## 2017-06-18 MED ORDER — SODIUM CHLORIDE 0.45 % IV SOLN
INTRAVENOUS | Status: DC
  Administered 2017-06-18 – 2017-06-20 (×2): via INTRAVENOUS
  Filled 2017-06-18 (×9): qty 1000

## 2017-06-18 MED ORDER — SODIUM CHLORIDE 0.9 % IV BOLUS (SEPSIS)
500.0000 mL | Freq: Once | INTRAVENOUS | Status: AC
  Administered 2017-06-18: 500 mL via INTRAVENOUS

## 2017-06-18 NOTE — Progress Notes (Signed)
*  PRELIMINARY RESULTS* Echocardiogram 2D Echocardiogram has been performed.  Samuel Germany 06/18/2017, 2:26 PM

## 2017-06-18 NOTE — Consult Note (Signed)
Consultation Note Date: 06/18/2017   Patient Name: [name]  DOB: [birth year]  MRN: [id number]  Age / Sex: 84 y.o., female  PCP: Celene Squibb, MD Referring Physician: Isaac Bliss, Olam Idler*  Reason for Consultation: Establishing goals of care and Psychosocial/spiritual support  HPI/Patient Profile: 84 y.o. female  with past medical history of stage 4 ling cancer with mets to spine, hx of lung cancer in 2007 with chemo, radiation and immune therapy, stroke in June 2018, Stage 3 CKD, chronic indwelling foley, A  Fib, ischemic cardiomyopathy EF 35-40%in 2015,  admitted on 06/15/2017 with catheter associated UTI.   Clinical Assessment and Goals of Care: Mrs. Wiens is lying in bed with her grandson feeding her ice cream, she is confused and will bring her hand to her mouth as though she were feeding herself. Present today is daughter/HCPAO [name] and her husband Simona Huh.  We go to the hallway for a brief discussion and to plan a family meeting.  [name] states she has seen palliative care in [place] when her mother had a stroke in June. Mrs. Fineberg was set to have in home palliative services with [facility], but changed her mind when she found palliative was connected to hospice provider. Per Palliative note in June "Even though she was in her 70s with cancer - she also worked taking care of the elderly.  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   Her distrust of Hospice started there as, in her view, her patients were over medicated until they passed".  We talk about Mrs. Millers confusion and I share that this is likely rt her poor kidney fx. [name] states that she has seen nephrologist, Dr. Theador Hawthorne and agrees that she would NOT want HD for Mrs. Jager. I share that the medical team agrees.    [name] brings up CPR, sharing that she understands the strain/damage of  chest compressions, but Mrs. Crossett does not. I share that, per Mrs. Millers POA paperwork, [name] is given the right to make different choices for her mother if Mrs. Ware is not able to understand and [name] has new information.    We plan for a family meeting 1/17 at bedside.    Healthcare power of attorney HCPOA -daughter, [name].   SUMMARY OF RECOMMENDATIONS   At this point, continue to treat the treatable. Daughter states that she would not have hemodialysis for her mother at this point. Continue CODE STATUS discussions.  Code Status/Advance Care Planning:  Full code  Symptom Management:   Per hospitalist, no additional needs at this time.   Palliative Prophylaxis:   Aspiration and Turn Reposition  Additional Recommendations (Limitations, Scope, Preferences):  Full Scope Treatment  Psycho-social/Spiritual:   Desire for further Chaplaincy support:no  Additional Recommendations: Caregiving  Support/Resources and Education on Hospice  Prognosis:   < 6 weeks, or less would not be surprising gif Mrs. Portales kidney function does not improve and she does not have dialysis.   Discharge Planning: to  be determined, based on outcomes.       Primary Diagnoses: Present on Admission: . CVA (cerebral vascular accident) ([place]) . Lung cancer stage IV with Metastatic squamous cell carcinoma to bone ([place]) . Catheter-associated urinary tract infection ([place]) . UTI (urinary tract infection) due to urinary indwelling catheter ([name])   I have reviewed the medical record, interviewed the patient and family, and examined the patient. The following aspects are pertinent.  Past Medical History:  Diagnosis Date  . A-fib ([place])   . Chronic anemia   . Chronic diastolic (congestive) heart failure ([place])   . Chronic fatigue 04/04/2015  . Chronic fatigue 04/04/2015  . CKD (chronic kidney disease)   . Coronary artery disease    a.  LHC (06/04/05): LHC done in [place] with  high grade RCA => s/p BMS to RCA;  b.  Nuclear (09/14/09): Lexiscan; Inf infarct with mild peri-infarct ishemia, EF 52%; Low Risk.  Marland Kitchen DVT (deep venous thrombosis) ([place])   . Foot drop, right   . GERD (gastroesophageal reflux disease)   . Goals of care, counseling/discussion 09/30/2016  . Hypercholesterolemia   . Hypertension   . Ischemic cardiomyopathy    a. Echo (07/26/13): Mild LVH, EF 35-40%, diff HK, inf AK, Gr 2 DD, Tr AI, mildly dilated Ao root, MAC, mild MR, mild LAE, mod reduced RVSF.  . Non-small cell carcinoma of lung ([place])    Stage IV;spinal cancer; had chemo and radiation and immune therapy  . Stroke [facility])    Social History   Socioeconomic History  . Marital status: Widowed    Spouse name: None  . Number of children: None  . Years of education: None  . Highest education level: None  Social Needs  . Financial resource strain: None  . Food insecurity - worry: None  . Food insecurity - inability: None  . Transportation needs - medical: None  . Transportation needs - non-medical: None  Occupational History  . None  Tobacco Use  . Smoking status: Former Smoker    Packs/day: 1.00    Years: 35.00    Pack years: 35.00    Types: Cigarettes    Last attempt to quit: 12/07/1990    Years since quitting: 26.5  . Smokeless tobacco: Never Used  Substance and Sexual Activity  . Alcohol use: No  . Drug use: No  . Sexual activity: No    Birth control/protection: None  Other Topics Concern  . None  Social History Narrative  . None   Family History  Problem Relation Age of Onset  . Heart failure Mother   . Lung cancer Sister   . Lung cancer Brother   . Stomach cancer Brother   . Stroke Other   . Heart attack Neg Hx   . Colon cancer Neg Hx    Scheduled Meds: . furosemide  40 mg Intravenous Q12H  . metoprolol tartrate  25 mg Oral Daily  . pantoprazole  40 mg Oral QAC breakfast  . sodium chloride flush  3 mL Intravenous Q12H   Continuous Infusions: . sodium chloride     . cefTRIAXone (ROCEPHIN)  IV Stopped (06/18/17 1620)  . sodium chloride 0.45 % 1,000 mL with sodium bicarbonate 75 mEq infusion 50 mL/hr at 06/18/17 1106   PRN Meds:.sodium chloride, acetaminophen **OR** acetaminophen, ondansetron **OR** ondansetron (ZOFRAN) IV, senna-docusate, sodium chloride flush Medications Prior to Admission:  Prior to Admission medications   Medication Sig Start Date End Date Taking? Authorizing Provider  acetaminophen (TYLENOL) 500 MG tablet  Take 500 mg by mouth daily as needed for mild pain.   Yes [provider]  diphenoxylate-atropine (LOMOTIL) 2.5-0.025 MG tablet Take 1 tablet by mouth 4 (four) times daily as needed for diarrhea or loose stools. Reported on 11/28/2015   Yes [provider]  furosemide (LASIX) 40 MG tablet Take 40 mg by mouth daily as needed for fluid or edema.  03/15/16  Yes [provider]  gabapentin (NEURONTIN) 100 MG capsule Take 100 mg by mouth daily as needed.   Yes [provider]  loperamide (IMODIUM) 2 MG capsule Take 1 capsule (2 mg total) by mouth every 6 (six) hours as needed for diarrhea or loose stools. 07/02/15  Yes Robbie Lis, MD  metoprolol tartrate (LOPRESSOR) 25 MG tablet Take 0.5 tablets (12.5 mg total) by mouth at bedtime. Patient taking differently: Take 25 mg by mouth daily.  11/03/16  Yes Riccio, Angela C, DO  pantoprazole (PROTONIX) 40 MG tablet Take 40 mg by mouth daily before breakfast.    Yes [provider]  Pembrolizumab (KEYTRUDA IV) Inject into the vein every 21 ( twenty-one) days.    [provider]   Allergies  Allergen Reactions  . Amlodipine Swelling  . Ciprofloxacin Hives and Other (See Comments)    REACTION: weakness  . Mirtazapine Other (See Comments)    Hallucinations and nightmares, verbally aggressive   . Statins Other (See Comments)    Unknown to patient  . Aspirin Swelling    Mouth swelling & tongue Patient reports that she tolerates ibuprofen  without a problem  . Cephalexin Hives  . Hydralazine Nausea And Vomiting  . Iron Nausea And Vomiting  . Ambien [Zolpidem Tartrate] Other (See Comments)    Confusion   . Eliquis [Apixaban]     Shortness of breath,blood in urine, swelling in joints  . Lorazepam Other (See Comments)    Hallucinations, verbally aggressive  . Sulfonamide Derivatives Hives  . Xarelto [Rivaroxaban]     GI bleeding  . Zolpidem     Confusion  . Latex Rash  . Penicillins Other (See Comments)    From childhood: Has patient had a PCN reaction causing immediate rash, facial/tongue/throat swelling, SOB or lightheadedness with hypotension: Yes Has patient had a PCN reaction causing severe rash involving mucus membranes or skin necrosis: Yes Has patient had a PCN reaction that required hospitalization No Has patient had a PCN reaction occurring within the last 10 years: No If all of the above answers are "NO", then may proceed with Cephalosporin use.   . Shellfish Allergy Hives and Rash  . Tape Rash    MUST USE PAPER TAPE!!!! SKIN IS VERY THIN AND WILL TEAR EASILY!!   Review of Systems  Unable to perform ROS: Mental status change    Physical Exam  Constitutional: No distress.  Appears frail, acutely/chronically ill, only briefly makes eye contact  HENT:  Head: Atraumatic.  Cardiovascular: Normal rate and regular rhythm.  Pulmonary/Chest: Effort normal. No respiratory distress.  Abdominal: Soft. She exhibits no distension.  Musculoskeletal: She exhibits no edema.  Thin and frail  Neurological: She is alert.  Confused  Skin: Skin is warm and dry.  Nursing note and vitals reviewed.   Vital Signs: BP (!) 88/40 (BP Location: Left Arm)   Pulse 95   Temp 97.9 F (36.6 C) (Oral)   Resp 18   Ht _0  (1.727 m)   Wt 60.7 kg (133 lb 13.1 oz)   SpO2 99%   BMI  20.35 kg/m  Pain Assessment: 0-10 POSS *See Group Information*: 1-Acceptable,Awake and alert Pain Score: Asleep   SpO2: SpO2: 99 % O2  Device:SpO2: 99 % O2 Flow Rate: .O2 Flow Rate (L/min): 2 L/min  IO: Intake/output summary:   Intake/Output Summary (Last 24 hours) at 06/18/2017 1631 Last data filed at 06/18/2017 1500 Gross per 24 hour  Intake 605 ml  Output 40 ml  Net 565 ml    LBM: Last BM Date: 06/18/17 Baseline Weight: Weight: 59.9 kg (132 lb) Most recent weight: Weight: 60.7 kg (133 lb 13.1 oz)     Palliative Assessment/Data:   Flowsheet Rows     Most Recent Value  Intake Tab  Referral Department  Hospitalist  Unit at Time of Referral  Med/Surg Unit  Palliative Care Primary Diagnosis  Nephrology  Date Notified  06/18/17  Palliative Care Type  Return patient Palliative Care  Reason for referral  Clarify Goals of Care, Psychosocial or Spiritual support  Date of Admission  06/15/17  Date first seen by Palliative Care  06/18/17  # of days Palliative referral response time  0 Day(s)  # of days IP prior to Palliative referral  3  Clinical Assessment  Palliative Performance Scale Score  30%  Pain Max last 24 hours  Not able to report  Pain Min Last 24 hours  Not able to report  Dyspnea Max Last 24 Hours  Not able to report  Dyspnea Min Last 24 hours  Not able to report  Psychosocial & Spiritual Assessment  Palliative Care Outcomes  Patient/Family meeting held?  Yes  Who was at the meeting?  daughter and SIL at bedside.   Palliative Care Outcomes  Clarified goals of care      Time In: 1520 Time Out: 1555 Time Total: 35 minutes Greater than 50%  of this time was spent counseling and coordinating care related to the above assessment and plan.  Signed by: Drue Novel, NP   Please contact Palliative Medicine Team phone at [phone] for questions and concerns.  For individual provider: See Shea Evans

## 2017-06-18 NOTE — Progress Notes (Signed)
PROGRESS NOTE    [name]  WLN:[id number] DOB: 24-Mar-1934 DOA: 06/15/2017 PCP: Celene Squibb, MD     Brief Narrative:  84 year old woman admitted from home on 1/13 with complaints of hematuria.  Was found to be hypothermic and admission was requested.  Today has been noted to have decreased urine output, increased lethargy, tense suprapubic area.   Assessment & Plan:   Principal Problem:   Catheter-associated urinary tract infection (HCC) Active Problems:   Lung cancer stage IV with Metastatic squamous cell carcinoma to bone (HCC)   CVA (cerebral vascular accident) ([place])   ARF (acute renal failure) (HCC)   CKD (chronic kidney disease) stage 4, GFR 15-29 ml/min (HCC)   Pressure injury of skin   UTI (urinary tract infection) due to urinary indwelling catheter ([place])   Catheter associated UTI -Continue Rocephin pending culture data.  Hematuria -Presumed due to UTI in addition to thrombocytopenia, improving/resolved.  Hypothermia -Presumed due to UTI, improving with warming blanket and antibiotics.  Thrombocytopenia -On chart review, has chronic thrombocytopenia with platelet count around 150, since admission her platelet count has been in the 40s-50s. -She is not currently on heparin products. -I did discuss case with Rulon Abide, nurse practitioner with oncology on 1/15 who in turn discussed with Dr. Lebron Conners, her supervising oncologist.  They believe possible etiologies to be related to her acute infection and antibiotic usage. -As she has not had a significant bleed and the platelet count remains above 10,000, they are not recommending platelet transfusion at this time, recommendations are for observation.  Acute metabolic encephalopathy -A little improved. -She is non-focal. -I believe this is metabolic due to early signs of uremia. -Discussed CT scan with daughter and how I believe it would be low yield given her weakness is generalized, not focal. She is ok with not  having it done.  Stage IV lung cancer -Continue outpatient follow-up with oncology as scheduled. -She sees Dr. Julien Nordmann in [place].  Acute on chronic kidney disease stage IV-V -Baseline creatinine is around 3.9 and up to 5.61 today. -Foley obstruction was ruled out after catheter was replaced and bladder scan showed a residual of only 40 ml. -Nephrology has been consulted. Appreciate their input and recommendations. -Very poor dialysis candidate given her ongoing medical comorbidities. -Palliative care consultation has been requested.  Acute on chronic combined CHF -Echo in June 2018 shows an ejection fraction of 40-45% with grade 1 diastolic dysfunction. -With increase in edema today after IV fluids given yesterday for renal dysfunction. -Await nephrology instructions in regards to further lasix dosing given progressive acute on CKD.   DVT prophylaxis: SCDs Code Status: Full code Family Communication: Daughter at bedside Disposition Plan: Pending medical improvement.  Consultants:   Nephrology  Palliative care pending  Procedures:   Echo  Renal US  Antimicrobials:  Anti-infectives (From admission, onward)   Start     Dose/Rate Route Frequency Ordered Stop   06/16/17 1600  cefTRIAXone (ROCEPHIN) 1 g in dextrose 5 % 50 mL IVPB     1 g 100 mL/hr over 30 Minutes Intravenous Every 24 hours 06/15/17 1833     06/15/17 1515  cefTRIAXone (ROCEPHIN) 1 g in dextrose 5 % 50 mL IVPB     1 g Intravenous  Once 06/15/17 1510 06/15/17 1840       Subjective: Lying in bed, drowsy, difficult to arouse.  Objective: Vitals:   06/18/17 0310 06/18/17 1432 06/18/17 1438 06/18/17 1536  BP: (!) 95/45 (!) 87/39 (!) 85/54 Marland Kitchen)  88/40  Pulse: 66 95    Resp: 18 18    Temp: 98 F (36.7 C) 97.9 F (36.6 C)    TempSrc: Oral Oral    SpO2: 97% 99%    Weight:      Height:        Intake/Output Summary (Last 24 hours) at 06/18/2017 1643 Last data filed at 06/18/2017 1500 Gross per 24 hour   Intake 605 ml  Output 40 ml  Net 565 ml   Filed Weights   06/15/17 1138 06/15/17 1820  Weight: 59.9 kg (132 lb) 60.7 kg (133 lb 13.1 oz)    Examination:  General exam: Drowsy, difficult to arouse, increase edema since yesterday, especially over her right arm which is where her IV has been placed Respiratory system: Clear to auscultation. Respiratory effort normal. Cardiovascular system:RRR. No murmurs, rubs, gallops. Gastrointestinal system: Abdomen is distended to the suprapubic area, patient grimaces when suprapubic area is pressed.  Normal bowel sounds. Central nervous system: Unable to fully assess given current mental state Extremities: 2+ pitting edema bilaterally, most significant over right upper extremity where IV has been placed Skin: No rashes, lesions or ulcers Psychiatry: Unable to fully assess given current mental state    Data Reviewed: I have personally reviewed following labs and imaging studies  CBC: Recent Labs  Lab 06/15/17 1207 06/16/17 0440 06/17/17 1408 06/18/17 0559  WBC 2.6* 2.1* 3.9* 3.6*  NEUTROABS 2.0  --   --   --   HGB 9.7* 9.7* 9.6* 9.4*  HCT 31.7* 32.7* 32.1* 31.4*  MCV 91.6 93.2 95.0 94.9  PLT 55* 58* 47* 50*   Basic Metabolic Panel: Recent Labs  Lab 06/15/17 1207 06/16/17 0440 06/17/17 1408 06/18/17 0559  NA 141 137 135 134*  K 4.9 5.8* 5.1 5.3*  CL 109 108 107 105  CO2 22 19* 17* 18*  GLUCOSE 116* 276* 106* 98  BUN 78* 75* 81* 83*  CREATININE 4.87* 4.98* 5.44* 5.61*  CALCIUM 7.7* 7.6* 7.6* 7.8*   GFR: Estimated Creatinine Clearance: 7.5 mL/min (A) (by C-G formula based on SCr of 5.61 mg/dL (H)). Liver Function Tests: Recent Labs  Lab 06/15/17 1207  AST 18  ALT 10*  ALKPHOS 93  BILITOT 0.6  PROT 6.2*  ALBUMIN 3.2*   No results for input(s): LIPASE, AMYLASE in the last 168 hours. No results for input(s): AMMONIA in the last 168 hours. Coagulation Profile: No results for input(s): INR, PROTIME in the last 168  hours. Cardiac Enzymes: No results for input(s): CKTOTAL, CKMB, CKMBINDEX, TROPONINI in the last 168 hours. BNP (last 3 results) No results for input(s): PROBNP in the last 8760 hours. HbA1C: No results for input(s): HGBA1C in the last 72 hours. CBG: No results for input(s): GLUCAP in the last 168 hours. Lipid Profile: No results for input(s): CHOL, HDL, LDLCALC, TRIG, CHOLHDL, LDLDIRECT in the last 72 hours. Thyroid Function Tests: No results for input(s): TSH, T4TOTAL, FREET4, T3FREE, THYROIDAB in the last 72 hours. Anemia Panel: No results for input(s): VITAMINB12, FOLATE, FERRITIN, TIBC, IRON, RETICCTPCT in the last 72 hours. Urine analysis:    Component Value Date/Time   COLORURINE YELLOW 06/15/2017 1310   APPEARANCEUR CLOUDY (A) 06/15/2017 1310   LABSPEC 1.010 06/15/2017 1310   LABSPEC 1.010 10/29/2016 1049   PHURINE 7.0 06/15/2017 1310   GLUCOSEU NEGATIVE 06/15/2017 1310   GLUCOSEU Negative 10/29/2016 1049   HGBUR LARGE (A) 06/15/2017 1310   BILIRUBINUR NEGATIVE 06/15/2017 1310   BILIRUBINUR Negative 10/29/2016 1049  KETONESUR NEGATIVE 06/15/2017 1310   PROTEINUR 100 (A) 06/15/2017 1310   UROBILINOGEN 0.2 10/29/2016 1049   NITRITE POSITIVE (A) 06/15/2017 1310   LEUKOCYTESUR LARGE (A) 06/15/2017 1310   LEUKOCYTESUR Large 10/29/2016 1049   Sepsis Labs: @LABRCNTIP (procalcitonin:4,lacticidven:4)  ) Recent Results (from the past 240 hour(s))  Urine Culture     Status: Abnormal   Collection Time: 06/15/17  1:10 PM  Result Value Ref Range Status   Specimen Description URINE, CLEAN CATCH  Final   Special Requests NONE  Final   Culture MULTIPLE SPECIES PRESENT, SUGGEST RECOLLECTION (A)  Final   Report Status 06/17/2017 FINAL  Final  Blood culture (routine x 2)     Status: None (Preliminary result)   Collection Time: 06/15/17  3:21 PM  Result Value Ref Range Status   Specimen Description   Final    BLOOD RIGHT ARM BOTTLES DRAWN AEROBIC AND ANAEROBIC   Special  Requests   Final    Blood Culture results may not be optimal due to an excessive volume of blood received in culture bottles   Culture NO GROWTH 3 DAYS  Final   Report Status PENDING  Incomplete  Blood culture (routine x 2)     Status: None (Preliminary result)   Collection Time: 06/15/17  3:32 PM  Result Value Ref Range Status   Specimen Description   Final    RIGHT ANTECUBITAL BOTTLES DRAWN AEROBIC AND ANAEROBIC   Special Requests Blood Culture adequate volume  Final   Culture NO GROWTH 3 DAYS  Final   Report Status PENDING  Incomplete         Radiology Studies: Dg Chest 1 View  Result Date: 06/18/2017 CLINICAL DATA:  CHF, coronary artery disease, lung malignancy, CVA, former smoker. EXAM: CHEST 1 VIEW COMPARISON:  The chest x-ray of July 25, 2015 and chest CT scan of May 14, 2017. FINDINGS: There is a small to moderate-sized right pleural effusion not greatly changed from the recent CT scan. There is a small left pleural effusion. The interstitial markings are mildly increased. The heart borders are largely obscured. The pulmonary vascularity is not clearly engorged. There is dense calcification in the wall of the thoracic aorta. The observed bony thorax is unremarkable. IMPRESSION: Moderate-sized right pleural effusion and trace left pleural effusion which are not new findings. Parenchymal consolidation at the right base, stable. No pulmonary edema. Thoracic aortic atherosclerosis. Electronically Signed   By: David  Martinique M.D.   On: 06/18/2017 12:43   US Renal  Result Date: 06/18/2017 CLINICAL DATA:  Urinary tract infection, acute renal insufficiency superimposed upon chronic renal disease. Hematuria. History of kidney stones. EXAM: RENAL / URINARY TRACT ULTRASOUND COMPLETE COMPARISON:  Abdominal and pelvic CT scan of May 14, 2017 FINDINGS: Right Kidney: Length: 7.6 cm in length. There is diffuse cortical thinning and increased cortical echotexture. There are shadowing  foci compatible with stones with the largest measuring 14 mm. There is a non simple cystic structure measuring 1.5 cm in diameter exophytic from the midpole laterally. Left Kidney: Length: 8.7 cm. There is diffuse cortical thinning. The renal cortical echotexture is mildly increased similar to that on the right. Bladder: The urinary bladder is decompressed by a Foley catheter. There is ascites and a right pleural effusion. IMPRESSION: Chronic renal atrophy. No hydronephrosis. Probable stones in the right kidney. Ascites and right pleural effusion. Electronically Signed   By: David  Martinique M.D.   On: 06/18/2017 12:30        Scheduled Meds: .  furosemide  40 mg Intravenous Q12H  . metoprolol tartrate  25 mg Oral Daily  . pantoprazole  40 mg Oral QAC breakfast  . sodium chloride flush  3 mL Intravenous Q12H   Continuous Infusions: . sodium chloride    . cefTRIAXone (ROCEPHIN)  IV Stopped (06/18/17 1620)  . sodium chloride 0.45 % 1,000 mL with sodium bicarbonate 75 mEq infusion 50 mL/hr at 06/18/17 1106     LOS: 1 day    Time spent: 35 minutes. Greater than 50% of this time was spent in direct contact with the patient coordinating care.     Lelon Frohlich, MD [facility] Pager [phone]  If 7PM-7AM, please contact night-coverage [email or web address] Password [facility] 06/18/2017, 4:43 PM

## 2017-06-18 NOTE — Consult Note (Signed)
[name] MRN: [id number] DOB/AGE: [birth year] 82 y.o. Primary Care Physician:Hall, Edwinna Areola, MD Admit date: 06/15/2017 Chief Complaint:  Chief Complaint  Patient presents with  . Hematuria   HPI: Pt is a 84 year old female with past medical hx of Lung cancer ( stage 4) who was brought to ER with c/o hematuria.  HPI dates back to 06/15/17 when her daughter noted a significant amount of blood in her urine in the foley bag . Patient has been  catheter-dependent ,she failed voiding trial at GU office in December 2018.   Pt also has a h/o afib and CVA and is not on anticoagulation due to recurrent hematuria.  Pt was asymptomatic at the time of admission  Upon evaluation in ER she was found to be hypothermic with a core body temp of 92.3 and Labs showed  Creat of 4.87 (baseline 3.9), Hb 9.7, MCV 91.6, UA is significant for a UTI. Pt was Admitted for UTI.  Pt during admission was noted to have oliguria and Nephrology was consulted Pt seen today on 3rd floor,  Pt daughter is present in the room Pt voices no new concerns Pt daughter coorelated her recent decline in health to the voiding trial.  " It all began when we changed her catheter" Then she had UTI for which she was on bactrim and now we are here" . She later mentioned-" look at her thighs, this is not my mom" Pt daughter other major concern besides swelling was " My mom is usually very sharp and today she is confused"    Past Medical History:  Diagnosis Date  . A-fib ([place])   . Chronic anemia   . Chronic diastolic (congestive) heart failure ([place])   . Chronic fatigue 04/04/2015  . Chronic fatigue 04/04/2015  . CKD (chronic kidney disease)   . Coronary artery disease    a.  LHC (06/04/05): LHC done in [place] with high grade RCA => s/p BMS to RCA;  b.  Nuclear (09/14/09): Lexiscan; Inf infarct with mild peri-infarct ishemia, EF 52%; Low Risk.  Marland Kitchen DVT (deep venous thrombosis) ([place])   . Foot drop, right   . GERD  (gastroesophageal reflux disease)   . Goals of care, counseling/discussion 09/30/2016  . Hypercholesterolemia   . Hypertension   . Ischemic cardiomyopathy    a. Echo (07/26/13): Mild LVH, EF 35-40%, diff HK, inf AK, Gr 2 DD, Tr AI, mildly dilated Ao root, MAC, mild MR, mild LAE, mod reduced RVSF.  . Non-small cell carcinoma of lung ([place])    Stage IV;spinal cancer; had chemo and radiation and immune therapy  . Stroke [facility])         Family History  Problem Relation Age of Onset  . Heart failure Mother   . Lung cancer Sister   . Lung cancer Brother   . Stomach cancer Brother   . Stroke Other   . Heart attack Neg Hx   . Colon cancer Neg Hx     Social History:  reports that she quit smoking about 26 years ago. Her smoking use included cigarettes. She has a 35.00 pack-year smoking history. she has never used smokeless tobacco. She reports that she does not drink alcohol or use drugs.   Allergies:  Allergies  Allergen Reactions  . Amlodipine Swelling  . Ciprofloxacin Hives and Other (See Comments)    REACTION: weakness  . Mirtazapine Other (See Comments)    Hallucinations and nightmares, verbally aggressive   . Statins Other (See Comments)  Unknown to patient  . Aspirin Swelling    Mouth swelling & tongue Patient reports that she tolerates ibuprofen without a problem  . Cephalexin Hives  . Hydralazine Nausea And Vomiting  . Iron Nausea And Vomiting  . Ambien [Zolpidem Tartrate] Other (See Comments)    Confusion   . Eliquis [Apixaban]     Shortness of breath,blood in urine, swelling in joints  . Lorazepam Other (See Comments)    Hallucinations, verbally aggressive  . Sulfonamide Derivatives Hives  . Xarelto [Rivaroxaban]     GI bleeding  . Zolpidem     Confusion  . Latex Rash  . Penicillins Other (See Comments)    From childhood: Has patient had a PCN reaction causing immediate rash, facial/tongue/throat swelling, SOB or lightheadedness with hypotension: Yes Has  patient had a PCN reaction causing severe rash involving mucus membranes or skin necrosis: Yes Has patient had a PCN reaction that required hospitalization No Has patient had a PCN reaction occurring within the last 10 years: No If all of the above answers are "NO", then may proceed with Cephalosporin use.   . Shellfish Allergy Hives and Rash  . Tape Rash    MUST USE PAPER TAPE!!!! SKIN IS VERY THIN AND WILL TEAR EASILY!!    Medications Prior to Admission  Medication Sig Dispense Refill  . acetaminophen (TYLENOL) 500 MG tablet Take 500 mg by mouth daily as needed for mild pain.    . diphenoxylate-atropine (LOMOTIL) 2.5-0.025 MG tablet Take 1 tablet by mouth 4 (four) times daily as needed for diarrhea or loose stools. Reported on 11/28/2015    . furosemide (LASIX) 40 MG tablet Take 40 mg by mouth daily as needed for fluid or edema.     . gabapentin (NEURONTIN) 100 MG capsule Take 100 mg by mouth daily as needed.    . loperamide (IMODIUM) 2 MG capsule Take 1 capsule (2 mg total) by mouth every 6 (six) hours as needed for diarrhea or loose stools. 30 capsule 0  . metoprolol tartrate (LOPRESSOR) 25 MG tablet Take 0.5 tablets (12.5 mg total) by mouth at bedtime. (Patient taking differently: Take 25 mg by mouth daily. ) 30 tablet 0  . pantoprazole (PROTONIX) 40 MG tablet Take 40 mg by mouth daily before breakfast.     . Pembrolizumab (KEYTRUDA IV) Inject into the vein every 21 ( twenty-one) days.         GDJ:MEQAS from the symptoms mentioned above,there are no other symptoms referable to all systems reviewed.  . furosemide  40 mg Intravenous Q12H  . metoprolol tartrate  25 mg Oral Daily  . pantoprazole  40 mg Oral QAC breakfast  . sodium chloride flush  3 mL Intravenous Q12H      Physical Exam: Vital signs in last 24 hours: Temp:  [97.7 F (36.5 C)-98.1 F (36.7 C)] 98 F (36.7 C) (01/16 0310) Pulse Rate:  [65-75] 66 (01/16 0310) Resp:  [18-20] 18 (01/16 0310) BP: (90-100)/(44-50)  95/45 (01/16 0310) SpO2:  [97 %-100 %] 97 % (01/16 0310) Weight change:  Last BM Date: 06/18/17  Intake/Output from previous day: 01/15 0701 - 01/16 0700 In: 650 [P.O.:600; IV Piggyback:50] Out: 78 [Urine:78] No intake/output data recorded.   Physical Exam: General- pt is awake,alert, oriented to time place and person Resp- No acute REsp distress,  Rhonchi minimal CVS- S1S2 regular in rate and rhythm, SEM 1/6 GIT- BS+, soft, NT, ND EXT- 2+ LE Edema, Cyanosis CNS- CN 2-12 grossly intact. Moving all 4  extremities    Lab Results: CBC Recent Labs    06/17/17 1408 06/18/17 0559  WBC 3.9* 3.6*  HGB 9.6* 9.4*  HCT 32.1* 31.4*  PLT 47* 50*    BMET Recent Labs    06/17/17 1408 06/18/17 0559  NA 135 134*  K 5.1 5.3*  CL 107 105  CO2 17* 18*  GLUCOSE 106* 98  BUN 81* 83*  CREATININE 5.44* 5.61*  CALCIUM 7.6* 7.8*   Creat trend 2019   4.8=> 5.44 2018  3.1--3.1( AKI) 2017  1.8--4.1( AKI) 2016   2.0--3.6(AKI) 2015   2.0--2.6 2014   2.2--2.4 2013   2.0--2.5 2012   1.8--2.2 2011   1.6--2.4(AKI) 2010   1.7--2.0 2009   1.8--2.0 2008   1.8--2.3 2007   2.1--4.8( AKI)  MICRO Recent Results (from the past 240 hour(s))  Urine Culture     Status: Abnormal   Collection Time: 06/15/17  1:10 PM  Result Value Ref Range Status   Specimen Description URINE, CLEAN CATCH  Final   Special Requests NONE  Final   Culture MULTIPLE SPECIES PRESENT, SUGGEST RECOLLECTION (A)  Final   Report Status 06/17/2017 FINAL  Final  Blood culture (routine x 2)     Status: None (Preliminary result)   Collection Time: 06/15/17  3:21 PM  Result Value Ref Range Status   Specimen Description   Final    BLOOD RIGHT ARM BOTTLES DRAWN AEROBIC AND ANAEROBIC   Special Requests   Final    Blood Culture results may not be optimal due to an excessive volume of blood received in culture bottles   Culture NO GROWTH 3 DAYS  Final   Report Status PENDING  Incomplete  Blood culture (routine x 2)      Status: None (Preliminary result)   Collection Time: 06/15/17  3:32 PM  Result Value Ref Range Status   Specimen Description   Final    RIGHT ANTECUBITAL BOTTLES DRAWN AEROBIC AND ANAEROBIC   Special Requests Blood Culture adequate volume  Final   Culture NO GROWTH 3 DAYS  Final   Report Status PENDING  Incomplete      Lab Results  Component Value Date   CALCIUM 7.8 (L) 06/18/2017   CAION 1.14 (L) 10/30/2016   PHOS 3.7 03/17/2015      Impression: 1)Renal  AKI secondary to Post renal/ATN               AKI sec to hypovolemia/Hypotension/UTI               AKI on CKD               CKD stage 4.               CKD since 2007               CKD secondary to post renal/Multiple AKI/Age asso decline                Progression of CKD marked with multiple AKI                Hematuria  Present sec to UTI                    Oliguric ATn                I did discuss pt kidney related issues with the pt and her family           2)CVS  A fib Medication-  On Beta blockers   3)Anemia HGb at goal (9--11)   4)CKD Mineral-Bone Disorder PTH not avail . Phosphorus at goal.   5)ID-admitted with UTI PMD following  6)Electrolytes  hyperkalemic Better than before  hyponatremic  AKI + CKD + Hypervolemia  7)Acid base Co2 not at goal  8) CNS- CVA vs Metabolic encephalopathy Primary team folowing   Plan:   Will ask for FENA Will ask for renal u/s Will ask for 2d echo Will start low rate IVF with bicarb to help with acidosis will ask for CXR I did discuss pt kidney related issues with the pt and her family.  Kaelyn Nauta S 06/18/2017, 10:06 AM

## 2017-06-18 NOTE — Progress Notes (Signed)
Pt blood pressure in the 80's over 50's both manually and automatically, MD made aware. Verbal order for a 500 cc Normal Saline bolus. Will switch back over to continuous fluids after bolus completes.

## 2017-06-18 NOTE — Progress Notes (Signed)
RN walked by pts room doing hourly rounds and pt was sitting sideways on bed with her legs hanging off the bed(bed alarm was set and did not go off). When RN when to assist pt to lay back in bed, pt stated she had been hollering for 2 hours for help and ringing the call bell. RN asked the nurse sitting at the desk-Sheena if pt had called and she stated "No" pt had not called at all. RN asked Suezanne Jacquet the phlebotomist who was down the same hall if he heard a pt yelling for help and he said he only heard her say something when he got right beside her room. This RN then asked pt to demonstrate ringing the call bell and pt was unable to demonstrate this request. This RN and NT got pt back in bed and situated and pt said, 'I'm going to complain to the CEO about things." I adv she should share her thoughts about her care with whomever she deemed necessary. Rn provided all of pts requests. Bed alarm continues to be on but on a different setting now. Will continue to monitor pt.

## 2017-06-18 NOTE — Progress Notes (Signed)
Pt has had a total output of 40L urine this entire shift. Blood pressure 95/45, pt alert and oriented. Will pass on to day shift and continue to monitor pt. Nephrology to see pt today

## 2017-06-18 NOTE — Progress Notes (Signed)
Palliative Medicine consult noted. Due to high referral volume, there may be a delay seeing this patient. Please call the Palliative Medicine Team office at [phone] if recommendations are needed in the interim.  Thank you for inviting [place] to see this patient.  Marjie Skiff Lajuana Patchell, RN, BSN, [facility] Palliative Medicine Team 06/18/2017 10:52 AM Office [phone]

## 2017-06-19 DIAGNOSIS — C3490 Malignant neoplasm of unspecified part of unspecified bronchus or lung: Secondary | ICD-10-CM

## 2017-06-19 DIAGNOSIS — G9341 Metabolic encephalopathy: Secondary | ICD-10-CM

## 2017-06-19 DIAGNOSIS — N179 Acute kidney failure, unspecified: Secondary | ICD-10-CM

## 2017-06-19 DIAGNOSIS — N184 Chronic kidney disease, stage 4 (severe): Secondary | ICD-10-CM

## 2017-06-19 DIAGNOSIS — Z5189 Encounter for other specified aftercare: Secondary | ICD-10-CM

## 2017-06-19 DIAGNOSIS — T83511D Infection and inflammatory reaction due to indwelling urethral catheter, subsequent encounter: Secondary | ICD-10-CM

## 2017-06-19 LAB — BASIC METABOLIC PANEL
ANION GAP: 12 (ref 5–15)
BUN: 84 mg/dL — ABNORMAL HIGH (ref 6–20)
CALCIUM: 7.6 mg/dL — AB (ref 8.9–10.3)
CO2: 16 mmol/L — ABNORMAL LOW (ref 22–32)
Chloride: 102 mmol/L (ref 101–111)
Creatinine, Ser: 5.97 mg/dL — ABNORMAL HIGH (ref 0.44–1.00)
GFR calc Af Amer: 7 mL/min — ABNORMAL LOW (ref >60)
GFR, EST NON AFRICAN AMERICAN: 6 mL/min — AB (ref >60)
GLUCOSE: 84 mg/dL (ref 65–99)
POTASSIUM: 5.3 mmol/L — AB (ref 3.5–5.1)
SODIUM: 130 mmol/L — AB (ref 135–145)

## 2017-06-19 LAB — CBC
HCT: 30.2 % — ABNORMAL LOW (ref 36.0–46.0)
Hemoglobin: 9.1 g/dL — ABNORMAL LOW (ref 12.0–15.0)
MCH: 28.3 pg (ref 26.0–34.0)
MCHC: 30.1 g/dL (ref 30.0–36.0)
MCV: 94.1 fL (ref 78.0–100.0)
PLATELETS: 40 10*3/uL — AB (ref 150–400)
RBC: 3.21 MIL/uL — AB (ref 3.87–5.11)
RDW: 17.1 % — AB (ref 11.5–15.5)
WBC: 2.7 10*3/uL — AB (ref 4.0–10.5)

## 2017-06-19 LAB — OSMOLALITY, URINE: Osmolality, Ur: 321 mOsm/kg (ref 300–900)

## 2017-06-19 MED ORDER — HYDROMORPHONE HCL 1 MG/ML IJ SOLN
0.5000 mg | INTRAMUSCULAR | Status: DC | PRN
  Administered 2017-06-20: 0.5 mg via INTRAVENOUS
  Filled 2017-06-19: qty 1

## 2017-06-19 MED ORDER — LORAZEPAM 2 MG/ML IJ SOLN
0.5000 mg | INTRAMUSCULAR | Status: DC | PRN
  Administered 2017-06-19: 1 mg via INTRAVENOUS
  Administered 2017-06-20 (×2): 0.5 mg via INTRAVENOUS
  Filled 2017-06-19 (×3): qty 1

## 2017-06-19 MED ORDER — FUROSEMIDE 10 MG/ML IJ SOLN
100.0000 mg | Freq: Two times a day (BID) | INTRAVENOUS | Status: DC
  Administered 2017-06-19 (×2): 100 mg via INTRAVENOUS
  Filled 2017-06-19 (×9): qty 10

## 2017-06-19 NOTE — Progress Notes (Signed)
Subjective: Interval History: has no complaint of some difficulty breathing.  Patient very sleepy and go back to sleep. According to her grand son patient did not sleep last night. She was confused and talking about things which don't exist Vital signs in last 24 hours: Temp:  [97.1 F (36.2 C)-97.9 F (36.6 C)] 97.1 F (36.2 C) (01/17 0600) Pulse Rate:  [83-100] 83 (01/17 0600) Resp:  [16-18] 16 (01/17 0600) BP: (85-97)/(39-54) 97/54 (01/17 0600) SpO2:  [96 %-99 %] 96 % (01/17 0600) Weight change:   Intake/Output from previous day: 01/16 0701 - 01/17 0700 In: 195 [I.V.:195] Out: 60 [Urine:60] Intake/Output this shift: No intake/output data recorded.  Patient is very somnolent and barely arousable Chest: Decreased breath sounds bilaterally Heart exam revealed regular rate and rhythm no murmur Extremities : Patient with pressure soar on her heels other  wise no significant edema  Lab Results: Recent Labs    06/17/17 1408 06/18/17 0559  WBC 3.9* 3.6*  HGB 9.6* 9.4*  HCT 32.1* 31.4*  PLT 47* 50*   BMET:  Recent Labs    06/17/17 1408 06/18/17 0559  NA 135 134*  K 5.1 5.3*  CL 107 105  CO2 17* 18*  GLUCOSE 106* 98  BUN 81* 83*  CREATININE 5.44* 5.61*  CALCIUM 7.6* 7.8*   No results for input(s): PTH in the last 72 hours. Iron Studies: No results for input(s): IRON, TIBC, TRANSFERRIN, FERRITIN in the last 72 hours.  Studies/Results: Dg Chest 1 View  Result Date: 06/18/2017 CLINICAL DATA:  CHF, coronary artery disease, lung malignancy, CVA, former smoker. EXAM: CHEST 1 VIEW COMPARISON:  The chest x-ray of July 25, 2015 and chest CT scan of May 14, 2017. FINDINGS: There is a small to moderate-sized right pleural effusion not greatly changed from the recent CT scan. There is a small left pleural effusion. The interstitial markings are mildly increased. The heart borders are largely obscured. The pulmonary vascularity is not clearly engorged. There is dense  calcification in the wall of the thoracic aorta. The observed bony thorax is unremarkable. IMPRESSION: Moderate-sized right pleural effusion and trace left pleural effusion which are not new findings. Parenchymal consolidation at the right base, stable. No pulmonary edema. Thoracic aortic atherosclerosis. Electronically Signed   By: David  Martinique M.D.   On: 06/18/2017 12:43   US Renal  Result Date: 06/18/2017 CLINICAL DATA:  Urinary tract infection, acute renal insufficiency superimposed upon chronic renal disease. Hematuria. History of kidney stones. EXAM: RENAL / URINARY TRACT ULTRASOUND COMPLETE COMPARISON:  Abdominal and pelvic CT scan of May 14, 2017 FINDINGS: Right Kidney: Length: 7.6 cm in length. There is diffuse cortical thinning and increased cortical echotexture. There are shadowing foci compatible with stones with the largest measuring 14 mm. There is a non simple cystic structure measuring 1.5 cm in diameter exophytic from the midpole laterally. Left Kidney: Length: 8.7 cm. There is diffuse cortical thinning. The renal cortical echotexture is mildly increased similar to that on the right. Bladder: The urinary bladder is decompressed by a Foley catheter. There is ascites and a right pleural effusion. IMPRESSION: Chronic renal atrophy. No hydronephrosis. Probable stones in the right kidney. Ascites and right pleural effusion. Electronically Signed   By: David  Martinique M.D.   On: 06/18/2017 12:30    I have reviewed the patient's current medications.  Assessment/Plan: 1] acute kidney injury superimposed on chronic possibly a combination of prerenal syndrome/ATN/secondary to Bactrim.  Patient is presently oliguric.Her BUN and creatinine is slightly high  2] difficulty breathing: Possibly a combination of pleural effusion/lung CA. 3] chronic renal failure: Stage IV.  Possibly a combination of recurrent acute kidney injury/ischemia/age related renal function loss. 4] anemia: Her hemoglobin is  below our target goal 5] hematuria: Possibly secondary to urinary tract infection/bleeding as patient is on anticoagulant  6] history of atrial fibrillation: Her heart rate is controlled 7] lung CA 8] Metabolic acidosis O On sodium bicarbonate . Her CO2 remains low I discussed with her grandson who is with patient about her poor condition and answered his questions.  Plan: 1]We will check a renal panel  in the morning 2] we will increase IV fluid with sodium bicarbonate to 75 cc/h 3] we will increase Lasix to 100 mg IV twice daily    LOS: 2 days   Jayjay Littles S 06/19/2017,7:36 AM

## 2017-06-19 NOTE — Progress Notes (Signed)
Daily Progress Note   Patient Name: [name]       Date: 06/19/2017 DOB: 05-25-1934  Age: 84 y.o. MRN#: [id number] Attending Physician: Orson Eva, MD Primary Care Physician: Celene Squibb, MD Admit Date: 06/15/2017   Please DO NOT mentioned hospice or palliative services to patient.   Reason for Consultation/Follow-up: Establishing goals of care, Inpatient hospice referral, Non pain symptom management and Psychosocial/spiritual support  Subjective:  Please DO NOT mentioned hospice or palliative services to patient.  [name] is lying quietly in bed.  She is calm and pleasant, but confused.  She is only oriented to self at this time.  Present today at bedside is daughter/healthcare power of attorney, [name] and her husband [name] and [name]'s grandson [name].  [name], chaplain [name] and I go  to my office for a family meeting. We talked in detail about the results from yesterday's testing including echocardiogram, renal ultrasound, and chest x-ray.  We talked about the current kidney function, and worsening trend from creatinine of 4.87 on 1/13 to currently creatinine 5.97on 1/17.  Sons are again states that her decision is no hemodialysis for [name].    We talked about [name]'s history that is varied including her history of cancer and cancer treatments.  We also talked about her functional status and her poor mobility for the last year and a half.  Daughter states that [name] has been "showing changes lately".  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    I share diagram of the chronic illness pathway, what is normal and expected.  We talked about the difference with palliative and hospice treatment plans.  [name] torn between taking her mother home with the help of nephew [name] and his wife [name] versus hospice home for comfort and dignity.  We talked about what is normal and expected as people succumb to kidney failure.  Sure that the medical team would endorse hospice home.  [name] has a moral distress because she shares that her mother would never want residential hospice services, but [name] feels that she would benefit from the  specialized care.   [name] asks that we give the news that [name] is fading and needs, would benefit from, residential hospice placement to her grandson (who was raised as her son) [name].  [name] and I meet with [name] and his wife [name] in the family room.  [name] almost immediately states that he understands his grandmother's condition is worsening and she is dying.  He states that last night she was seeing her sister who has been deceased for 30 years.  We talked about what is normal and expected as people come near end of life.  Booklets "hard choices for loving People" and "gone from my sight" shared with family.  [name] shares his concern that people go to hospice and are given medication, they are kept "out of it" until they die.  We talked about what is normal and expected at end of life, his wife [name] is able to share her recent experience with her mother.  [name] is able to share his fear that his grandmother will not know him.  [name] is able to share her fear that she will be abandoned by her family because of the choice for hospice.  Family is requesting to continue today's treatment plan of increased IV fluids, increase Lasix, rechecking kidney function tomorrow.  [name] endorses that she does not expect any changes, and anticipates transfer to [facility]  tomorrow.   Please DO NOT mentioned hospice or palliative services to patient.  Length of Stay: 2  Current Medications: Scheduled Meds:  . metoprolol tartrate  25 mg Oral Daily  . pantoprazole  40 mg Oral QAC breakfast  . sodium chloride flush  3 mL Intravenous Q12H    Continuous Infusions: . sodium chloride    . cefTRIAXone (ROCEPHIN)  IV Stopped (06/18/17 1620)  . furosemide Stopped (06/19/17 1124)  . sodium chloride 0.45 % 1,000 mL with sodium bicarbonate 75 mEq infusion 75 mL/hr at 06/19/17 0806    PRN Meds: sodium chloride, acetaminophen **OR** acetaminophen, HYDROmorphone (DILAUDID) injection, LORazepam, ondansetron **OR** ondansetron (ZOFRAN) IV, senna-docusate, sodium chloride flush  Physical Exam  Constitutional: No distress.  Appears weak, acutely/chronically ill.  Confused  HENT:  Head: Normocephalic and atraumatic.  Cardiovascular: Normal rate and regular rhythm.  Pulmonary/Chest: Effort normal. No respiratory distress.  Abdominal: Soft. She exhibits no distension. There is tenderness.  Musculoskeletal: She exhibits edema.  Neurological: She is alert.  Oriented to self only.  Skin: Skin is warm and dry.  Nursing note and vitals reviewed.           Vital Signs: BP (!) 97/54 (BP Location: Left Arm)   Pulse 83   Temp (!) 97.1 F (36.2 C)   Resp 16   Ht 5\' 8"  (1.727 m)   Wt 60.7 kg (133 lb 13.1 oz)   SpO2 96%   BMI 20.35 kg/m  SpO2: SpO2: 96 % O2 Device: O2 Device: Nasal Cannula O2 Flow Rate: O2 Flow Rate (L/min): 2 L/min  Intake/output summary:   Intake/Output Summary (Last 24 hours) at 06/19/2017 1300 Last data filed at 06/19/2017 0600 Gross per 24 hour  Intake 195 ml  Output 60 ml  Net 135 ml   LBM: Last BM Date: 06/18/17 Baseline Weight: Weight: 59.9 kg (132 lb) Most recent weight: Weight: 60.7 kg (133 lb 13.1 oz)       Palliative Assessment/Data:    Flowsheet Rows     Most Recent Value  Intake Tab  Referral Department  Hospitalist  Unit at Time of Referral  Med/Surg Unit  Palliative Care Primary Diagnosis  Nephrology  Date Notified  06/18/17  Palliative Care Type  Return patient Palliative Care  Reason for referral  Clarify Goals of Care, Psychosocial or Spiritual support  Date of Admission  06/15/17  Date first seen by Palliative Care  06/18/17  # of days Palliative referral response time  0 Day(s)  # of days IP prior to Palliative referral  3  Clinical Assessment  Palliative Performance Scale Score  30%  Pain Max last 24 hours  Not able to report  Pain Min Last 24 hours  Not able to report  Dyspnea Max Last 24 Hours  Not able to report  Dyspnea Min Last 24 hours  Not able to report  Psychosocial & Spiritual Assessment  Palliative Care Outcomes  Patient/Family meeting held?  Yes  Who was at the meeting?  daughter and SIL at bedside.   Palliative Care Outcomes  Clarified goals of care      Patient Active Problem List   Diagnosis Date Noted  . DNR (do not resuscitate) discussion   . Pressure injury of skin 06/17/2017  . UTI (urinary tract infection) due to urinary indwelling catheter ([place]) 06/17/2017  . CVA (cerebral vascular accident) ([place]) 06/15/2017  . ARF (acute renal failure) ([place]) 06/15/2017  . CKD (chronic kidney disease) stage 4, GFR 15-29 ml/min ([facility]) 06/15/2017  . Palliative care encounter   . Encounter for hospice care discussion   . Chronic fatigue 01/20/2017  . Stage 3 chronic kidney disease ([place]) 01/07/2017  . History of DVT (deep vein thrombosis) 01/07/2017  . Acute CVA (cerebrovascular accident) ([place])   . Hyperlipidemia   . Primary malignant neoplasm of lung metastatic to other site [facility])   . Stroke (cerebrum) ([place]) 10/30/2016  . Encounter for antineoplastic immunotherapy 09/30/2016  . Goals of care, counseling/discussion 09/30/2016  . Port catheter in place 03/20/2016  . Abdominal pain   . Acute on chronic renal failure ([place])   . Acute on chronic respiratory failure ([place])  07/25/2015  . SOB (shortness of breath) 07/25/2015  . Catheter-associated urinary tract infection ([place]) 07/20/2015  . Atrial fibrillation ([place]) 07/19/2015  . Acute on chronic diastolic (congestive) heart failure ([place]) 07/19/2015  . UTI (lower urinary tract infection) 07/17/2015  . Hyperkalemia 07/17/2015  . Dehydration 07/17/2015  . Right foot ulcer ([place])   . Abscess   . Right leg DVT ([place]) 06/24/2015  . Diarrhea 06/24/2015  . Edema   . Sepsis ([place])   . Malnutrition of moderate degree 06/18/2015  . Hypothermia 06/16/2015  . Paraspinal abscess ([place])   . Acute renal failure superimposed on stage 3 chronic kidney disease ([place]) 03/17/2015  . Anemia of chronic disease 03/17/2015  . Hypokalemia 03/17/2015  . Hypotension 02/23/2015  . Lung cancer stage IV with Metastatic squamous cell carcinoma to bone ([place]) 11/28/2014  . Pressure ulcer 11/24/2014    Palliative Care Assessment & Plan   Patient Profile: 84 y.o. female  with past medical history of stage 4 ling cancer with mets to spine, hx of lung cancer in 2007 with chemo, radiation and immune therapy, stroke in June 2018, Stage 3 CKD, chronic indwelling foley, A  Fib, ischemic cardiomyopathy EF 35-40%in 2015,  admitted on 06/15/2017 with catheter associated UTI.   Assessment: UTI with acute on chronic renal failure.  Mrs. Laye kidney function has continued to worsen during her hospitalization despite medical treatment.  On admit her creatinine was 4.87, it has  increased daily until today 1/17, creatinine is 5.97.  Family states that they have seeing the changes in the worsening for Mrs. Sabra Heck, they are requesting treatment today, but expecting to transfer to [facility] home for comfort and dignity at end of life tomorrow.  Recommendations/Plan:  Trial day, 1/17, of increased Lasix and IV fluid.  Healthcare power of attorney/daughter, [name], states that she would like comfort and dignity at end of life for  her mother, transfer to [facility], tomorrow if no improvements.  Goals of Care and Additional Recommendations:  Limitations on Scope of Treatment: No CPR, no intubation.  Code Status:    Code Status Orders  (From admission, onward)        Start     Ordered   06/19/17 1258  Do not attempt resuscitation (DNR)  Continuous    Question Answer Comment  In the event of cardiac or respiratory ARREST Do not call a "code blue"   In the event of cardiac or respiratory ARREST Do not perform Intubation, CPR, defibrillation or ACLS   In the event of cardiac or respiratory ARREST Use medication by any route, position, wound care, and other measures to relive pain and suffering. May use oxygen, suction and manual treatment of airway obstruction as needed for comfort.      06/19/17 1257    Code Status History    Date Active Date Inactive Code Status Order ID Comments User Context   06/15/2017 18:33 06/19/2017 12:57 Full Code [id number]  Erline Hau, MD Inpatient   10/30/2016 23:19 11/04/2016 15:01 Full Code [id number]  Lovenia Kim, MD ED   11/28/2015 05:17 11/30/2015 16:41 Full Code [id number]  Orvan Falconer, MD Inpatient   07/25/2015 22:42 07/27/2015 18:20 Full Code [id number]  Ivor Costa, MD ED   07/17/2015 21:29 07/22/2015 19:06 Full Code [id number]  Vianne Bulls, MD ED   06/16/2015 18:21 07/03/2015 19:29 Full Code [id number]  Florencia Reasons, MD Inpatient   03/17/2015 17:14 03/20/2015 15:57 Full Code [id number]  Theodis Blaze, MD Inpatient   11/24/2014 22:58 11/29/2014 18:58 Full Code [id number]  Markus Daft, MD Inpatient   11/24/2014 04:42 11/24/2014 22:58 Full Code [id number]  Rise Patience, MD Inpatient   11/23/2014 09:32 11/24/2014 03:21 Full Code [id number]  Sandi Mariscal, MD HOV   07/29/2014 02:20 07/29/2014 21:07 Full Code [id number]  Shanda Howells, MD ED    Advance Directive Documentation     Most Recent Value  Type of Advance Directive  Healthcare Power of Attorney   Pre-existing out of facility DNR order (yellow form or pink MOST form)  No data  "MOST" Form in Place?  No data       Prognosis:   < 2 weeks assuming kidney function and family's desire to not seek hemodialysis.  Discharge Planning:  Likely transition to residential hospice at [facility] 1/18.  Care plan was discussed with nursing staff, case management, social worker, and Dr. Carles Collet.  Thank you for allowing the Palliative Medicine Team to assist in the care of this patient.   Time In: 0950 Time Out: 1240 Total Time 170 minutes Prolonged Time Billed  yes       Greater than 50%  of this time was spent counseling and coordinating care related to the above assessment and plan.  Drue Novel, NP  Please contact Palliative Medicine Team phone at [phone] for questions and concerns.

## 2017-06-19 NOTE — Progress Notes (Signed)
Warming blanket removed from patient as rectal temperature has remained 97.9 steady. Still have machine and blanket at bedside if patient were to drop down and need it again. Will continue to monitor.

## 2017-06-19 NOTE — Clinical Social Work Note (Signed)
Demographics  Comment    Last edited by  on at   Address: Home Phone: Work Phone: Mobile Phone:  [place] [address]   [phone] -- [phone]  SSN: Insurance: Marital Status: Religion:  [id number] [place]  Emergency Contact(s)   Name Relation Home Work [place] Daughter [phone]  [employer] Other   [phone]  Patient Ethnicity & Race   Ethnic Group Patient Race  Not Hispanic or Latino White or [facility] Account   Name Acct ID Class Status Primary Coverage  [name], [name] [id number] [place]      Guarantor Account (for Hospital Account [id number])   Name Relation to [place]? Acct Type  [name] Self [facility] Yes Personal/Family  Address Phone    [address] [place], [place] [address] [phone])        Coverage Information (for Hospital Account [id number])   F/O Payor/Plan Precert #  [facility] [place] #  [name], [name] [id number]  Address [place]  [address] [place] [address] [phone]

## 2017-06-19 NOTE — Progress Notes (Signed)
PROGRESS NOTE  [name] YBO:[id number] DOB: Nov 29, 1933 DOA: 06/15/2017 PCP: Celene Squibb, MD  Brief History:   84 y.o. female who live at home with her daughter who brings her in 06/15/17 because she has noted a significant amount of blood in her urine in the foley bag over the past 12 hours.  Patient has a h/o stage IV lung cancer with mets to her spine and developed a large wound on that area in 2016 that has since closed. They had placed a foley catheter to allow that area to heal, however, it was never removed and she has been catheter-dependent since. Failed voiding trial at GU office in December 2018.  Also has a h/o afib and CVA and is not on anticoagulation due to recurrent hematuria.  During hospitalization, the patient was noted to have decreasing urine output and worsening renal function.  Nephrology was consulted to assist.  Patient was noted to have a catheter associated UTI for which she was started on ceftriaxone.   Assessment/Plan: Catheter associated UTI -Continue Rocephin   Hematuria -Presumed due to UTI in addition to thrombocytopenia, improving/resolved.  Hypothermia -Presumed due to UTI, improving with warming blanket and antibiotics. -resolved with tx of infection  Thrombocytopenia -On chart review, has chronic thrombocytopenia with platelet count around 150, since admission her platelet count has been in the 40s-50s. -She is not currently on heparin products. Dr. Jerilee Hoh discussed case with Rulon Abide, nurse practitioner with oncology on 1/15 who in turn discussed with Dr. Lebron Conners, her supervising oncologist.  They believe possible etiologies to be related to her acute infection and antibiotic usage. -As she has not had a significant bleed and the platelet count remains above 10,000, they are not recommending platelet transfusion at this time, recommendations are for observation. -hematuria resolved; no other source of obvious bleeding  Acute  metabolic encephalopathy -A little improved, but having sundowing -Multifactorial including infection and worsening renal function -She is non-focal. -I believe this is metabolic due to early signs of uremia. -Discussed CT scan with daughter and how I believe it would be low yield given her weakness is generalized, not focal. She is ok with not having it done.  Stage IV lung cancer -Continue outpatient follow-up with oncology as scheduled. -She sees Dr. Julien Nordmann in [place]. -Initially diagnosed March 2007 with disease recurrence June 2016 -Last treatment of Keytruda 01/20/17  Acute on chronic kidney disease stage IV-V -Baseline creatinine is around 3.5-3.9 -Foley obstruction was ruled out after catheter was replaced and bladder scan showed a residual of only 40 ml. -Renal ultrasound negative for hydronephrosis -Nephrology has been consulted. Appreciate their input and recommendations. -Very poor dialysis candidate given her ongoing medical comorbidities. -Palliative care consultation has been requested. -Renal function continues to worsen  Acute on chronic combined CHF -Echo in June 2018 shows an ejection fraction of 40-45% with grade 1 diastolic dysfunction. -2/77/8242-PNTI--RW 40-45%, diffuse HK, grade 2 DD -Continue furosemide and IV fluids per nephrology  Goals of care -Palliative medicine consult appreciated -Patient changed to DNR -Plans to transition to residential hospice in the next 24 hours if no improvement    Disposition Plan:   [facility] 1/18 if daughter agreeable  Family Communication:   Daughter updated at bedside 1/17--Total time spent 35 minutes.  Greater than 50% spent face to face counseling and coordinating care.   Consultants:  Renal, palliative  Code Status:   DNR  DVT Prophylaxis:  SCDs  Procedures: As Listed in Progress Note Above  Antibiotics: Ceftriaxone 06/15/17    Subjective: Patient denies fevers, chills, headache,  chest pain, dyspnea, nausea, vomiting, diarrhea, abdominal pain, dysuria, hematuria, hematochezia, and melena.   Objective: Vitals:   06/18/17 1438 06/18/17 1536 06/18/17 2100 06/19/17 0600  BP: (!) 85/54 (!) 88/40 (!) 89/41 (!) 97/54  Pulse:   100 83  Resp:    16  Temp:   97.6 F (36.4 C) (!) 97.1 F (36.2 C)  TempSrc:   Oral   SpO2:   98% 96%  Weight:      Height:        Intake/Output Summary (Last 24 hours) at 06/19/2017 1608 Last data filed at 06/19/2017 1528 Gross per 24 hour  Intake 1567.5 ml  Output 60 ml  Net 1507.5 ml   Weight change:  Exam:   General:  Pt is alert, follows commands appropriately, not in acute distress  HEENT: No icterus, No thrush, No neck mass, [place]/AT  Cardiovascular: RRR, S1/S2, no rubs, no gallops  Respiratory: Bibasilar crackles diminished breath sounds bilateral.  No wheezing.  Abdomen: Soft/+BS, non tender, non distended, no guarding  Extremities: 1 + LE edema, No lymphangitis, No petechiae, No rashes, no synovitis   Data Reviewed: I have personally reviewed following labs and imaging studies Basic Metabolic Panel: Recent Labs  Lab 06/15/17 1207 06/16/17 0440 06/17/17 1408 06/18/17 0559 06/19/17 0737  NA 141 137 135 134* 130*  K 4.9 5.8* 5.1 5.3* 5.3*  CL 109 108 107 105 102  CO2 22 19* 17* 18* 16*  GLUCOSE 116* 276* 106* 98 84  BUN 78* 75* 81* 83* 84*  CREATININE 4.87* 4.98* 5.44* 5.61* 5.97*  CALCIUM 7.7* 7.6* 7.6* 7.8* 7.6*   Liver Function Tests: Recent Labs  Lab 06/15/17 1207  AST 18  ALT 10*  ALKPHOS 93  BILITOT 0.6  PROT 6.2*  ALBUMIN 3.2*   No results for input(s): LIPASE, AMYLASE in the last 168 hours. No results for input(s): AMMONIA in the last 168 hours. Coagulation Profile: No results for input(s): INR, PROTIME in the last 168 hours. CBC: Recent Labs  Lab 06/15/17 1207 06/16/17 0440 06/17/17 1408 06/18/17 0559 06/19/17 0737  WBC 2.6* 2.1* 3.9* 3.6* 2.7*  NEUTROABS 2.0  --   --   --   --     HGB 9.7* 9.7* 9.6* 9.4* 9.1*  HCT 31.7* 32.7* 32.1* 31.4* 30.2*  MCV 91.6 93.2 95.0 94.9 94.1  PLT 55* 58* 47* 50* 40*   Cardiac Enzymes: No results for input(s): CKTOTAL, CKMB, CKMBINDEX, TROPONINI in the last 168 hours. BNP: Invalid input(s): POCBNP CBG: No results for input(s): GLUCAP in the last 168 hours. HbA1C: No results for input(s): HGBA1C in the last 72 hours. Urine analysis:    Component Value Date/Time   COLORURINE YELLOW 06/15/2017 1310   APPEARANCEUR CLOUDY (A) 06/15/2017 1310   LABSPEC 1.010 06/15/2017 1310   LABSPEC 1.010 10/29/2016 1049   PHURINE 7.0 06/15/2017 1310   GLUCOSEU NEGATIVE 06/15/2017 1310   GLUCOSEU Negative 10/29/2016 1049   HGBUR LARGE (A) 06/15/2017 1310   BILIRUBINUR NEGATIVE 06/15/2017 1310   BILIRUBINUR Negative 10/29/2016 1049   KETONESUR NEGATIVE 06/15/2017 1310   PROTEINUR 100 (A) 06/15/2017 1310   UROBILINOGEN 0.2 10/29/2016 1049   NITRITE POSITIVE (A) 06/15/2017 1310   LEUKOCYTESUR LARGE (A) 06/15/2017 1310   LEUKOCYTESUR Large 10/29/2016 1049   Sepsis Labs: @LABRCNTIP (procalcitonin:4,lacticidven:4) ) Recent Results (from the past 240 hour(s))  Urine Culture  Status: Abnormal   Collection Time: 06/15/17  1:10 PM  Result Value Ref Range Status   Specimen Description URINE, CLEAN CATCH  Final   Special Requests NONE  Final   Culture MULTIPLE SPECIES PRESENT, SUGGEST RECOLLECTION (A)  Final   Report Status 06/17/2017 FINAL  Final  Blood culture (routine x 2)     Status: None (Preliminary result)   Collection Time: 06/15/17  3:21 PM  Result Value Ref Range Status   Specimen Description   Final    BLOOD RIGHT ARM BOTTLES DRAWN AEROBIC AND ANAEROBIC   Special Requests   Final    Blood Culture results may not be optimal due to an excessive volume of blood received in culture bottles   Culture NO GROWTH 4 DAYS  Final   Report Status PENDING  Incomplete  Blood culture (routine x 2)     Status: None (Preliminary result)    Collection Time: 06/15/17  3:32 PM  Result Value Ref Range Status   Specimen Description   Final    RIGHT ANTECUBITAL BOTTLES DRAWN AEROBIC AND ANAEROBIC   Special Requests Blood Culture adequate volume  Final   Culture NO GROWTH 4 DAYS  Final   Report Status PENDING  Incomplete     Scheduled Meds: . metoprolol tartrate  25 mg Oral Daily  . pantoprazole  40 mg Oral QAC breakfast  . sodium chloride flush  3 mL Intravenous Q12H   Continuous Infusions: . sodium chloride    . cefTRIAXone (ROCEPHIN)  IV Stopped (06/19/17 1556)  . furosemide Stopped (06/19/17 1124)  . sodium chloride 0.45 % 1,000 mL with sodium bicarbonate 75 mEq infusion 75 mL/hr at 06/19/17 4259    Procedures/Studies: Dg Chest 1 View  Result Date: 06/18/2017 CLINICAL DATA:  CHF, coronary artery disease, lung malignancy, CVA, former smoker. EXAM: CHEST 1 VIEW COMPARISON:  The chest x-ray of July 25, 2015 and chest CT scan of May 14, 2017. FINDINGS: There is a small to moderate-sized right pleural effusion not greatly changed from the recent CT scan. There is a small left pleural effusion. The interstitial markings are mildly increased. The heart borders are largely obscured. The pulmonary vascularity is not clearly engorged. There is dense calcification in the wall of the thoracic aorta. The observed bony thorax is unremarkable. IMPRESSION: Moderate-sized right pleural effusion and trace left pleural effusion which are not new findings. Parenchymal consolidation at the right base, stable. No pulmonary edema. Thoracic aortic atherosclerosis. Electronically Signed   By: Ronen Bromwell  [place] M.D.   On: 06/18/2017 12:43   US Renal  Result Date: 06/18/2017 CLINICAL DATA:  Urinary tract infection, acute renal insufficiency superimposed upon chronic renal disease. Hematuria. History of kidney stones. EXAM: RENAL / URINARY TRACT ULTRASOUND COMPLETE COMPARISON:  Abdominal and pelvic CT scan of May 14, 2017 FINDINGS: Right  Kidney: Length: 7.6 cm in length. There is diffuse cortical thinning and increased cortical echotexture. There are shadowing foci compatible with stones with the largest measuring 14 mm. There is a non simple cystic structure measuring 1.5 cm in diameter exophytic from the midpole laterally. Left Kidney: Length: 8.7 cm. There is diffuse cortical thinning. The renal cortical echotexture is mildly increased similar to that on the right. Bladder: The urinary bladder is decompressed by a Foley catheter. There is ascites and a right pleural effusion. IMPRESSION: Chronic renal atrophy. No hydronephrosis. Probable stones in the right kidney. Ascites and right pleural effusion. Electronically Signed   By: Yuto Cajuste  [place] M.D.   On:  06/18/2017 12:30    Orson Eva, DO  [facility] Pager [phone]  If 7PM-7AM, please contact night-coverage [email or web address] Password [email or web address] 06/19/2017, 4:08 PM   LOS: 2 days

## 2017-06-19 NOTE — Progress Notes (Signed)
Pt temperature decreased to 96.4 rectally on vitals check, pt placed back on bair hugger. Will continue to monitor.

## 2017-06-20 DIAGNOSIS — C7951 Secondary malignant neoplasm of bone: Secondary | ICD-10-CM

## 2017-06-20 DIAGNOSIS — I5043 Acute on chronic combined systolic (congestive) and diastolic (congestive) heart failure: Secondary | ICD-10-CM

## 2017-06-20 LAB — CULTURE, BLOOD (ROUTINE X 2)
CULTURE: NO GROWTH
CULTURE: NO GROWTH
Special Requests: ADEQUATE

## 2017-06-20 LAB — RENAL FUNCTION PANEL
ALBUMIN: 2.7 g/dL — AB (ref 3.5–5.0)
Anion gap: 10 (ref 5–15)
BUN: 88 mg/dL — ABNORMAL HIGH (ref 6–20)
CHLORIDE: 106 mmol/L (ref 101–111)
CO2: 18 mmol/L — ABNORMAL LOW (ref 22–32)
Calcium: 7.7 mg/dL — ABNORMAL LOW (ref 8.9–10.3)
Creatinine, Ser: 5.89 mg/dL — ABNORMAL HIGH (ref 0.44–1.00)
GFR calc Af Amer: 7 mL/min — ABNORMAL LOW (ref >60)
GFR calc non Af Amer: 6 mL/min — ABNORMAL LOW (ref >60)
GLUCOSE: 83 mg/dL (ref 65–99)
PHOSPHORUS: 7.2 mg/dL — AB (ref 2.5–4.6)
POTASSIUM: 5.4 mmol/L — AB (ref 3.5–5.1)
Sodium: 134 mmol/L — ABNORMAL LOW (ref 135–145)

## 2017-06-20 LAB — CBC
HEMATOCRIT: 30.8 % — AB (ref 36.0–46.0)
HEMOGLOBIN: 9.4 g/dL — AB (ref 12.0–15.0)
MCH: 28.5 pg (ref 26.0–34.0)
MCHC: 30.5 g/dL (ref 30.0–36.0)
MCV: 93.3 fL (ref 78.0–100.0)
Platelets: 40 10*3/uL — ABNORMAL LOW (ref 150–400)
RBC: 3.3 MIL/uL — ABNORMAL LOW (ref 3.87–5.11)
RDW: 17 % — ABNORMAL HIGH (ref 11.5–15.5)
WBC: 2.5 10*3/uL — AB (ref 4.0–10.5)

## 2017-06-20 MED ORDER — MORPHINE SULFATE (CONCENTRATE) 10 MG /0.5 ML PO SOLN: 4.0000 mg | ORAL | 0 refills | Status: AC | PRN

## 2017-06-20 NOTE — Clinical Social Work Note (Signed)
[facility] and her family member has elected for pt to admit there. Plan is for dc today. Faxed dc summary to the residence. Envelope for dc prepared. Palliative Care APNP working on DNR paperwork. Updated pt's RN who will call report and arrange EMS transport when pt ready for dc. There are no other SW needs at this time.

## 2017-06-20 NOTE — Progress Notes (Signed)
Pt's temperature has decreased throughout night while on warming blanket, temperature has remained in 94 range. MD aware.

## 2017-06-20 NOTE — Progress Notes (Signed)
[name]  MRN: [id number]  DOB/AGE: 1934-03-05 84 y.o.  Primary Care Physician:Hall, Edwinna Areola, MD  Admit date: 06/15/2017  Chief Complaint:  Chief Complaint  Patient presents with  . Hematuria    S-Pt presented on  06/15/2017 with  Chief Complaint  Patient presents with  . Hematuria  .    Pt voices no new concerns.pt family has decided to ask for hospice.   Meds . metoprolol tartrate  25 mg Oral Daily  . pantoprazole  40 mg Oral QAC breakfast  . sodium chloride flush  3 mL Intravenous Q12H     Physical Exam: Vital signs in last 24 hours: Temp:  [94.3 F (34.6 C)-97.9 F (36.6 C)] 94.3 F (34.6 C) (01/18 0513) Pulse Rate:  [58-83] 58 (01/18 0458) Resp:  [16-20] 20 (01/18 0458) BP: (97-109)/(40-54) 99/40 (01/18 0458) SpO2:  [90 %-99 %] 99 % (01/18 0458) Weight change:  Last BM Date: 06/19/17  Intake/Output from previous day: 01/17 0701 - 01/18 0700 In: 2492.5 [I.V.:2272.5; IV Piggyback:220] Out: -  Total I/O In: 925 [I.V.:865; IV Piggyback:60] Out: -    Physical Exam: General- pt is awake,follows commands Resp- No acute REsp distress,  Rhonchi present. CVS- S1S2 regular in rate and rhythm, SEM 1/6 GIT- BS+, soft, NT, ND EXT- 2+ LE Edema, Cyanosis      Lab Results: CBC Recent Labs    06/18/17 0559 06/19/17 0737  WBC 3.6* 2.7*  HGB 9.4* 9.1*  HCT 31.4* 30.2*  PLT 50* 40*    BMET Recent Labs    06/18/17 0559 06/19/17 0737  NA 134* 130*  K 5.3* 5.3*  CL 105 102  CO2 18* 16*  GLUCOSE 98 84  BUN 83* 84*  CREATININE 5.61* 5.97*  CALCIUM 7.8* 7.6*    Creat trend 2019   4.8=> 5.44=> 5.97 2018  3.1--3.1( AKI) 2017  1.8--4.1( AKI) 2016   2.0--3.6(AKI) 2015   2.0--2.6 2014   2.2--2.4 2013   2.0--2.5 2012   1.8--2.2 2011   1.6--2.4(AKI) 2010   1.7--2.0 2009   1.8--2.0 2008   1.8--2.3 2007   2.1--4.8( AKI)    MICRO Recent Results (from the past 240 hour(s))  Urine Culture     Status: Abnormal   Collection Time: 06/15/17   1:10 PM  Result Value Ref Range Status   Specimen Description URINE, CLEAN CATCH  Final   Special Requests NONE  Final   Culture MULTIPLE SPECIES PRESENT, SUGGEST RECOLLECTION (A)  Final   Report Status 06/17/2017 FINAL  Final  Blood culture (routine x 2)     Status: None (Preliminary result)   Collection Time: 06/15/17  3:21 PM  Result Value Ref Range Status   Specimen Description   Final    BLOOD RIGHT ARM BOTTLES DRAWN AEROBIC AND ANAEROBIC   Special Requests   Final    Blood Culture results may not be optimal due to an excessive volume of blood received in culture bottles   Culture NO GROWTH 4 DAYS  Final   Report Status PENDING  Incomplete  Blood culture (routine x 2)     Status: None (Preliminary result)   Collection Time: 06/15/17  3:32 PM  Result Value Ref Range Status   Specimen Description   Final    RIGHT ANTECUBITAL BOTTLES DRAWN AEROBIC AND ANAEROBIC   Special Requests Blood Culture adequate volume  Final   Culture NO GROWTH 4 DAYS  Final   Report Status PENDING  Incomplete  Lab Results  Component Value Date   CALCIUM 7.6 (L) 06/19/2017   CAION 1.14 (L) 10/30/2016   PHOS 3.7 03/17/2015               Impression: 1)Renal  AKI secondary to Post renal/ATN               AKI sec to hypovolemia/Hypotension/UTI               AKI on CKD               CKD stage 4.               CKD since 2007               CKD secondary to post renal/Multiple AKI/Age asso decline                Progression of CKD marked with multiple AKI                Hematuria  Present sec to UTI                    Oliguric ATn               AKi worsening                Pt and her family have decided to not ask for aggressive measures.                 Pt seen by palliative care team           2)CVS   A fib Medication-  On Beta blockers   3)Anemia HGb at goal (9--11)   4)CKD Mineral-Bone Disorder PTH not avail . Phosphorus at goal.   5)ID-admitted with  UTI PMD following  6)Electrolytes  hyperkalemic stable  hyponatremic  AKI + CKD + Hypervolemia  7)Acid base Co2 not at goal was no IV Bicarb   8) CNS- CVA vs Metabolic encephalopathy Primary team folowing      Plan:   Agree with current tx and plan Agree with transition ton home hospice.    Melek Pownall S 06/20/2017, 5:48 AM

## 2017-06-20 NOTE — Progress Notes (Signed)
Daily Progress Note   Patient Name: [name]       Date: 06/20/2017 DOB: 10/30/1933  Age: 84 y.o. MRN#: [id number] Attending Physician: Orson Eva, MD Primary Care Physician: Celene Squibb, MD Admit Date: 06/15/2017  Reason for Consultation/Follow-up: Establishing goals of care, Inpatient hospice referral and Psychosocial/spiritual support  Subjective: [name] has worsened overnight, she is unable to maintain her core body temperature above 94 degrees.  She has been placed on a warming blanket.  Her kidney function is not improved today showing creatinine of 5.89.  Family is elected to not seek hemodialysis, instead to focus on comfort and dignity at end of life.  Present today at bedside is daughter/HC POA, [name], grandson [name] and his wife [name], and son for his daughter.  Family is tearful, and [name] states that she has been in contact with hospice home of [place].  Family is requesting comfort and dignity at end of life, transfer to residential hospice at [facility].  Symptom management needs discussed with family and nursing.  Patient care discussed with chaplain Joya Gaskins.  Patient disposition discussed with hospitalist, Dr. Carles Collet.  Length of Stay: 3  Current Medications: Scheduled Meds:    Continuous Infusions:   PRN Meds: acetaminophen **OR** acetaminophen, HYDROmorphone (DILAUDID) injection, LORazepam, ondansetron **OR** ondansetron (ZOFRAN) IV  Physical Exam  Constitutional: No distress.  Actively dying, does not respond  HENT:  Head: Atraumatic.  Cardiovascular: Normal rate.  Pulmonary/Chest: Effort normal. No respiratory distress.  Abdominal: Soft. She exhibits distension.  Musculoskeletal: She exhibits edema.  Neurological:    Unresponsive  Skin: Skin is warm and dry.  Nursing note and vitals reviewed.           Vital Signs: BP (!) 99/40 (BP Location: Left Arm)   Pulse (!) 58   Temp (!) 94.3 F (34.6 C) (Rectal)   Resp 20   Ht 5\' 8"  (1.727 m)   Wt 60.7 kg (133 lb 13.1 oz)   SpO2 99%   BMI 20.35 kg/m  SpO2: SpO2: 99 % O2 Device: O2 Device: Nasal Cannula O2 Flow Rate: O2 Flow Rate (L/min): 2 L/min  Intake/output summary:   Intake/Output Summary (Last 24 hours) at 06/20/2017 0958 Last data filed at 06/20/2017 5993 Gross per 24 hour  Intake 2492.5 ml  Output 175  ml  Net 2317.5 ml   LBM: Last BM Date: 06/19/17 Baseline Weight: Weight: 59.9 kg (132 lb) Most recent weight: Weight: 60.7 kg (133 lb 13.1 oz)       Palliative Assessment/Data:    Flowsheet Rows     Most Recent Value  Intake Tab  Referral Department  Hospitalist  Unit at Time of Referral  Med/Surg Unit  Palliative Care Primary Diagnosis  Nephrology  Date Notified  06/18/17  Palliative Care Type  Return patient Palliative Care  Reason for referral  Clarify Goals of Care, Psychosocial or Spiritual support  Date of Admission  06/15/17  Date first seen by Palliative Care  06/18/17  # of days Palliative referral response time  0 Day(s)  # of days IP prior to Palliative referral  3  Clinical Assessment  Palliative Performance Scale Score  30%  Pain Max last 24 hours  Not able to report  Pain Min Last 24 hours  Not able to report  Dyspnea Max Last 24 Hours  Not able to report  Dyspnea Min Last 24 hours  Not able to report  Psychosocial & Spiritual Assessment  Palliative Care Outcomes  Patient/Family meeting held?  Yes  Who was at the meeting?  daughter and SIL at bedside.   Palliative Care Outcomes  Clarified goals of care      Patient Active Problem List   Diagnosis Date Noted  . Acute renal failure superimposed on stage 4 chronic kidney disease ([place]) 06/19/2017  . Non-small cell lung cancer ([place]) 06/19/2017  . Acute  metabolic encephalopathy 62/95/2841  . DNR (do not resuscitate) discussion   . Pressure injury of skin 06/17/2017  . UTI (urinary tract infection) due to urinary indwelling catheter ([place]) 06/17/2017  . CVA (cerebral vascular accident) ([place]) 06/15/2017  . ARF (acute renal failure) ([place]) 06/15/2017  . CKD (chronic kidney disease) stage 4, GFR 15-29 ml/min (HCC) 06/15/2017  . Palliative care encounter   . Encounter for hospice care discussion   . Chronic fatigue 01/20/2017  . Stage 3 chronic kidney disease ([place]) 01/07/2017  . History of DVT (deep vein thrombosis) 01/07/2017  . Acute CVA (cerebrovascular accident) ([place])   . Hyperlipidemia   . Primary malignant neoplasm of lung metastatic to other site [facility])   . Stroke (cerebrum) ([place]) 10/30/2016  . Encounter for antineoplastic immunotherapy 09/30/2016  . Goals of care, counseling/discussion 09/30/2016  . Port catheter in place 03/20/2016  . Abdominal pain   . Acute on chronic renal failure ([place])   . Acute on chronic respiratory failure ([place]) 07/25/2015  . SOB (shortness of breath) 07/25/2015  . Catheter-associated urinary tract infection ([place]) 07/20/2015  . Atrial fibrillation ([place]) 07/19/2015  . Acute on chronic diastolic (congestive) heart failure ([place]) 07/19/2015  . UTI (lower urinary tract infection) 07/17/2015  . Hyperkalemia 07/17/2015  . Dehydration 07/17/2015  . Right foot ulcer ([place])   . Abscess   . Right leg DVT ([place]) 06/24/2015  . Diarrhea 06/24/2015  . Edema   . Sepsis ([place])   . Malnutrition of moderate degree 06/18/2015  . Hypothermia 06/16/2015  . Paraspinal abscess ([place])   . Acute renal failure superimposed on stage 3 chronic kidney disease ([place]) 03/17/2015  . Anemia of chronic disease 03/17/2015  . Hypokalemia 03/17/2015  . Hypotension 02/23/2015  . Lung cancer stage IV with Metastatic squamous cell carcinoma to bone ([place]) 11/28/2014  . Pressure ulcer 11/24/2014    Palliative Care Assessment & Plan    Patient Profile: 84 y.o.femalewith  past medical history of stage 4 ling cancer with mets to spine, hx of lung cancer in 2007with chemo, radiation and immune therapy, stroke in June 2018, Stage 3 CKD, chronic indwelling foley,A Fib, ischemic cardiomyopathy EF 35-40%in 2015,admitted on 1/13/2019with catheter associated UTI.   Assessment: UTI with acute on chronic renal failure.  Mrs. Lindamood kidney function has continued to worsen during her hospitalization despite medical treatment.  On admit her creatinine was 4.87, it has increased daily until today 1/17, creatinine is 5.97.  Family states that they have seeing the changes in the worsening for Mrs. Sabra Heck, they are requesting treatment today, but expecting to transfer to [facility] [place] home for comfort and dignity at end of life tomorrow.  Recommendations/Plan:  Healthcare power of attorney/daughter, [name], states that she would like comfort and dignity at end of life for her mother, transfer to [facility] residential hospice.  Goals of Care and Additional Recommendations:  Limitations on Scope of Treatment: Full Comfort Care  Code Status:    Code Status Orders  (From admission, onward)        Start     Ordered   06/19/17 1258  Do not attempt resuscitation (DNR)  Continuous    Question Answer Comment  In the event of cardiac or respiratory ARREST Do not call a "code blue"   In the event of cardiac or respiratory ARREST Do not perform Intubation, CPR, defibrillation or ACLS   In the event of cardiac or respiratory ARREST Use medication by any route, position, wound care, and other measures to relive pain and suffering. May use oxygen, suction and manual treatment of airway obstruction as needed for comfort.      06/19/17 1257    Code Status History    Date Active Date Inactive Code Status Order ID Comments User Context   06/15/2017 18:33 06/19/2017 12:57 Full Code [id number]  Erline Hau, MD Inpatient   10/30/2016 23:19 11/04/2016 15:01 Full Code [id number]  Lovenia Kim, MD ED   11/28/2015 05:17 11/30/2015 16:41 Full Code [id number]  Orvan Falconer, MD Inpatient   07/25/2015 22:42 07/27/2015 18:20 Full Code [id number]  Ivor Costa, MD ED   07/17/2015 21:29 07/22/2015 19:06 Full Code [id number]  Vianne Bulls, MD ED   06/16/2015 18:21 07/03/2015 19:29 Full Code [id number]  Florencia Reasons, MD Inpatient   03/17/2015 17:14 03/20/2015 15:57 Full Code [id number]  Theodis Blaze, MD Inpatient   11/24/2014 22:58 11/29/2014 18:58 Full Code [id number]  Markus Daft, MD Inpatient   11/24/2014 04:42 11/24/2014 22:58 Full Code [id number]  Rise Patience, MD Inpatient   11/23/2014 09:32 11/24/2014 03:21 Full Code [id number]  Sandi Mariscal, MD HOV   07/29/2014 02:20 07/29/2014 21:07 Full Code [id number]  Shanda Howells, MD ED    Advance Directive Documentation     Most Recent Value  Type of Advance Directive  Healthcare Power of Attorney  Pre-existing out of facility DNR order (yellow form or pink MOST form)  No data  "MOST" Form in Place?  No data       Prognosis:   < 2 weeks  Discharge Planning:  Hospice facility  Care plan was discussed with nursing staff, case management, social worker, and Dr. Carles Collet.  Thank you for allowing the Palliative Medicine Team to assist in the care of this patient.   Time In: 0910 Time Out: 1000 Total Time 50 minutes Prolonged Time Billed  yes       Greater than 50%  of  this time was spent counseling and coordinating care related to the above assessment and plan.  Drue Novel, NP  Please contact Palliative Medicine Team phone at [phone] for questions and concerns.

## 2017-06-20 NOTE — Progress Notes (Signed)
Report called to the receiving nurse at [facility]. Will Call EMS for transport.

## 2017-06-20 NOTE — Care Management Important Message (Signed)
Important Message  Patient Details  Name: [name] MRN: [id number] Date of Birth: June 24, 1933   Medicare Important Message Given:  Yes    Sherald Barge, RN 06/20/2017, 11:30 AM

## 2017-06-20 NOTE — Discharge Summary (Signed)
Physician Discharge Summary  [name] LYY:[id number] DOB: 09-17-1933 DOA: 06/15/2017  PCP: Celene Squibb, MD  Admit date: 06/15/2017 Discharge date: 06/20/2017  Admitted From:  HOME Disposition:  Residential Hospice     Discharge Condition: Stable CODE STATUS: FULL COMFORT Diet recommendation: pleasure feeds   Brief/Interim Summary: 84 y.o.femalewho live at home with her daughter who brings her in 06/15/17 because she has noted a significant amount of blood in her urine in the foley bag over the past 12 hours. Patient has a h/o stage IV lung cancer with mets to her spine and developed a large wound on that area in 2016 that has since closed. They had placed a foley catheter to allow that area to heal, however, it was never removed and she has been catheter-dependent since. Failed voiding trial at GU office in December 2018. Also has a h/o afib and CVA and is not on anticoagulation due to recurrent hematuria.  During hospitalization, the patient was noted to have decreasing urine output and worsening renal function.  Nephrology was consulted to assist.  Patient was noted to have a catheter associated UTI for which she was started on ceftriaxone. Unfortunately, the patient's renal function continued to deteriorate during the hospitalization.  The patient appears clinically volume overloaded.  Nephrology was consulted to assist with management.  Diuretics and IV fluids were adjusted.  Unfortunately, the patient continued to decline clinically despite optimal therapy.  Palliative medicine was consulted.  After family meetings, mature decision was made to transition the patient's focus of care to full comfort.  The patient subsequently was transitioned to residential hospice.    Discharge Diagnoses:  Catheter associated UTI -Continue Rocephin  -finished 5 days  Hematuria -Presumed due to UTI in addition to thrombocytopenia, improving/resolved.  Hypothermia -Presumed due to  UTI, improving with warming blanket and antibiotics.   Thrombocytopenia -On chart review, has chronic thrombocytopenia with platelet count around 150, since admission her platelet count has been in the 40s-50s. -She is not currently on heparin products. Dr. Jerilee Hoh discussed case with Rulon Abide, nurse practitioner with oncologyon 1/15who in turn discussed with Dr. Lebron Conners, her supervising oncologist. They believe possible etiologies to be related to her acute infection and antibiotic usage. -As she has not had a significant bleed and the platelet count remains above 10,000, they are not recommending platelet transfusion at this time, recommendations are for observation. -hematuria resolved; no other source of obvious bleeding  Acute metabolic encephalopathy -A little improved, but having sundowing -Multifactorial including infection and worsening renal function -She is non-focal. -I believe this is metabolic due to early signs of uremia. -Discussed CT scan with daughter and how I believe it would be low yield given her weakness is generalized, not focal. She is ok with not having it done.  Stage IV lung cancer -Continue outpatient follow-up with oncology as scheduled. -She sees Dr. Julien Nordmann in [place]. -Initially diagnosed March 2007 with disease recurrence June 2016 -Last treatment of Keytruda 01/20/17  Acute on chronic kidney disease stage IV-V -Baseline creatinine is around 3.5-3.9 -Foley obstruction was ruled out after catheter was replaced and bladder scan showed a residual of only 40 ml. -Renal ultrasound negative for hydronephrosis -Nephrology has been consulted. Appreciate their input and recommendations. -Very poor dialysis candidate given her ongoing medical comorbidities. -Palliative care consultation has been requested. -Renal function continues to worsen Unfortunately, the patient continued to decline clinically despite optimal therapy.  Palliative medicine was  consulted.  After family meetings, mature decision was made  to transition the patient's focus of care to full comfort.  The patient subsequently was transitioned to residential hospice.  Acute on chronic combined CHF -Echo in June 2018 shows an ejection fraction of 40-45% with grade 1 diastolic dysfunction. -1/49/[id number]-[id number]--YI 40-45%, diffuse HK, grade 2 DD -Continue furosemide and IV fluids per nephrology  Goals of care -Palliative medicine consult appreciated -Patient changed to DNR -Unfortunately, the patient continued to decline clinically despite optimal therapy.  Palliative medicine was consulted.  After family meetings, mature decision was made to transition the patient's focus of care to full comfort.  The patient subsequently was transitioned to residential hospice.      Discharge Instructions  Discharge Instructions    Diet - low sodium heart healthy   Complete by:  As directed    Increase activity slowly   Complete by:  As directed      Allergies as of 06/20/2017      Reactions   Amlodipine Swelling   Ciprofloxacin Hives, Other (See Comments)   REACTION: weakness   Mirtazapine Other (See Comments)   Hallucinations and nightmares, verbally aggressive    Statins Other (See Comments)   Unknown to patient   Aspirin Swelling   Mouth swelling & tongue Patient reports that she tolerates ibuprofen without a problem   Cephalexin Hives   Hydralazine Nausea And Vomiting   Iron Nausea And Vomiting   Ambien [zolpidem Tartrate] Other (See Comments)   Confusion   Eliquis [apixaban]    Shortness of breath,blood in urine, swelling in joints   Lorazepam Other (See Comments)   Hallucinations, verbally aggressive   Sulfonamide Derivatives Hives   Xarelto [rivaroxaban]    GI bleeding   Zolpidem    Confusion   Latex Rash   Penicillins Other (See Comments)   From childhood: Has patient had a PCN reaction causing immediate rash, facial/tongue/throat swelling, SOB or  lightheadedness with hypotension: Yes Has patient had a PCN reaction causing severe rash involving mucus membranes or skin necrosis: Yes Has patient had a PCN reaction that required hospitalization No Has patient had a PCN reaction occurring within the last 10 years: No If all of the above answers are "NO", then may proceed with Cephalosporin use.   Shellfish Allergy Hives, Rash   Tape Rash   MUST USE PAPER TAPE!!!! SKIN IS VERY THIN AND WILL TEAR EASILY!!      Medication List    STOP taking these medications   diphenoxylate-atropine 2.5-0.025 MG tablet Commonly known as:  LOMOTIL   furosemide 40 MG tablet Commonly known as:  LASIX   gabapentin 100 MG capsule Commonly known as:  NEURONTIN   KEYTRUDA IV   loperamide 2 MG capsule Commonly known as:  IMODIUM   metoprolol tartrate 25 MG tablet Commonly known as:  LOPRESSOR   pantoprazole 40 MG tablet Commonly known as:  PROTONIX     TAKE these medications   acetaminophen 500 MG tablet Commonly known as:  TYLENOL Take 500 mg by mouth daily as needed for mild pain.   morphine CONCENTRATE 10 mg / 0.5 ml concentrated solution Take 0.2 mLs (4 mg total) by mouth every hour as needed for severe pain.       Allergies  Allergen Reactions  . Amlodipine Swelling  . Ciprofloxacin Hives and Other (See Comments)    REACTION: weakness  . Mirtazapine Other (See Comments)    Hallucinations and nightmares, verbally aggressive   . Statins Other (See Comments)    Unknown to patient  .  Aspirin Swelling    Mouth swelling & tongue Patient reports that she tolerates ibuprofen without a problem  . Cephalexin Hives  . Hydralazine Nausea And Vomiting  . Iron Nausea And Vomiting  . Ambien [Zolpidem Tartrate] Other (See Comments)    Confusion   . Eliquis [Apixaban]     Shortness of breath,blood in urine, swelling in joints  . Lorazepam Other (See Comments)    Hallucinations, verbally aggressive  . Sulfonamide Derivatives Hives  .  Xarelto [Rivaroxaban]     GI bleeding  . Zolpidem     Confusion  . Latex Rash  . Penicillins Other (See Comments)    From childhood: Has patient had a PCN reaction causing immediate rash, facial/tongue/throat swelling, SOB or lightheadedness with hypotension: Yes Has patient had a PCN reaction causing severe rash involving mucus membranes or skin necrosis: Yes Has patient had a PCN reaction that required hospitalization No Has patient had a PCN reaction occurring within the last 10 years: No If all of the above answers are "NO", then may proceed with Cephalosporin use.   . Shellfish Allergy Hives and Rash  . Tape Rash    MUST USE PAPER TAPE!!!! SKIN IS VERY THIN AND WILL TEAR EASILY!!    Consultations:  Renal  Palliative medicine   Procedures/Studies: Dg Chest 1 View  Result Date: 06/18/2017 CLINICAL DATA:  CHF, coronary artery disease, lung malignancy, CVA, former smoker. EXAM: CHEST 1 VIEW COMPARISON:  The chest x-ray of July 25, 2015 and chest CT scan of May 14, 2017. FINDINGS: There is a small to moderate-sized right pleural effusion not greatly changed from the recent CT scan. There is a small left pleural effusion. The interstitial markings are mildly increased. The heart borders are largely obscured. The pulmonary vascularity is not clearly engorged. There is dense calcification in the wall of the thoracic aorta. The observed bony thorax is unremarkable. IMPRESSION: Moderate-sized right pleural effusion and trace left pleural effusion which are not new findings. Parenchymal consolidation at the right base, stable. No pulmonary edema. Thoracic aortic atherosclerosis. Electronically Signed   By: Marilene Vath  [place] M.D.   On: 06/18/2017 12:43   US Renal  Result Date: 06/18/2017 CLINICAL DATA:  Urinary tract infection, acute renal insufficiency superimposed upon chronic renal disease. Hematuria. History of kidney stones. EXAM: RENAL / URINARY TRACT ULTRASOUND COMPLETE  COMPARISON:  Abdominal and pelvic CT scan of May 14, 2017 FINDINGS: Right Kidney: Length: 7.6 cm in length. There is diffuse cortical thinning and increased cortical echotexture. There are shadowing foci compatible with stones with the largest measuring 14 mm. There is a non simple cystic structure measuring 1.5 cm in diameter exophytic from the midpole laterally. Left Kidney: Length: 8.7 cm. There is diffuse cortical thinning. The renal cortical echotexture is mildly increased similar to that on the right. Bladder: The urinary bladder is decompressed by a Foley catheter. There is ascites and a right pleural effusion. IMPRESSION: Chronic renal atrophy. No hydronephrosis. Probable stones in the right kidney. Ascites and right pleural effusion. Electronically Signed   By: Cathy Ropp  [place] M.D.   On: 06/18/2017 12:30        Discharge Exam: Vitals:   06/20/17 0458 06/20/17 0513  BP: (!) 99/40   Pulse: (!) 58   Resp: 20   Temp:  (!) 94.3 F (34.6 C)  SpO2: 99%    Vitals:   06/19/17 2032 06/19/17 2135 06/20/17 0458 06/20/17 0513  BP:   (!) 99/40   Pulse:   (!) 58  Resp:   20   Temp:    (!) 94.3 F (34.6 C)  TempSrc:    Rectal  SpO2: 97% 92% 99%   Weight:      Height:        General: Pt is alert, awake, not in acute distress Cardiovascular: RRR, S1/S2 +, no rubs, no gallops Respiratory: CTA bilaterally, no wheezing, no rhonchi Abdominal: Soft, NT, ND, bowel sounds + Extremities: no edema, no cyanosis   The results of significant diagnostics from this hospitalization (including imaging, microbiology, ancillary and laboratory) are listed below for reference.    Significant Diagnostic Studies: Dg Chest 1 View  Result Date: 06/18/2017 CLINICAL DATA:  CHF, coronary artery disease, lung malignancy, CVA, former smoker. EXAM: CHEST 1 VIEW COMPARISON:  The chest x-ray of July 25, 2015 and chest CT scan of May 14, 2017. FINDINGS: There is a small to moderate-sized right pleural  effusion not greatly changed from the recent CT scan. There is a small left pleural effusion. The interstitial markings are mildly increased. The heart borders are largely obscured. The pulmonary vascularity is not clearly engorged. There is dense calcification in the wall of the thoracic aorta. The observed bony thorax is unremarkable. IMPRESSION: Moderate-sized right pleural effusion and trace left pleural effusion which are not new findings. Parenchymal consolidation at the right base, stable. No pulmonary edema. Thoracic aortic atherosclerosis. Electronically Signed   By: Etienne Mowers  [place] M.D.   On: 06/18/2017 12:43   US Renal  Result Date: 06/18/2017 CLINICAL DATA:  Urinary tract infection, acute renal insufficiency superimposed upon chronic renal disease. Hematuria. History of kidney stones. EXAM: RENAL / URINARY TRACT ULTRASOUND COMPLETE COMPARISON:  Abdominal and pelvic CT scan of May 14, 2017 FINDINGS: Right Kidney: Length: 7.6 cm in length. There is diffuse cortical thinning and increased cortical echotexture. There are shadowing foci compatible with stones with the largest measuring 14 mm. There is a non simple cystic structure measuring 1.5 cm in diameter exophytic from the midpole laterally. Left Kidney: Length: 8.7 cm. There is diffuse cortical thinning. The renal cortical echotexture is mildly increased similar to that on the right. Bladder: The urinary bladder is decompressed by a Foley catheter. There is ascites and a right pleural effusion. IMPRESSION: Chronic renal atrophy. No hydronephrosis. Probable stones in the right kidney. Ascites and right pleural effusion. Electronically Signed   By: Madilyne Tadlock  [place] M.D.   On: 06/18/2017 12:30     Microbiology: Recent Results (from the past 240 hour(s))  Urine Culture     Status: Abnormal   Collection Time: 06/15/17  1:10 PM  Result Value Ref Range Status   Specimen Description URINE, CLEAN CATCH  Final   Special Requests NONE  Final    Culture MULTIPLE SPECIES PRESENT, SUGGEST RECOLLECTION (A)  Final   Report Status 06/17/2017 FINAL  Final  Blood culture (routine x 2)     Status: None   Collection Time: 06/15/17  3:21 PM  Result Value Ref Range Status   Specimen Description   Final    BLOOD RIGHT ARM BOTTLES DRAWN AEROBIC AND ANAEROBIC   Special Requests   Final    Blood Culture results may not be optimal due to an excessive volume of blood received in culture bottles   Culture NO GROWTH 5 DAYS  Final   Report Status 06/20/2017 FINAL  Final  Blood culture (routine x 2)     Status: None   Collection Time: 06/15/17  3:32 PM  Result Value Ref Range Status  Specimen Description   Final    RIGHT ANTECUBITAL BOTTLES DRAWN AEROBIC AND ANAEROBIC   Special Requests Blood Culture adequate volume  Final   Culture NO GROWTH 5 DAYS  Final   Report Status 06/20/2017 FINAL  Final     Labs: Basic Metabolic Panel: Recent Labs  Lab 06/16/17 0440 06/17/17 1408 06/18/17 0559 06/19/17 0737 06/20/17 0509  NA 137 135 134* 130* 134*  K 5.8* 5.1 5.3* 5.3* 5.4*  CL 108 107 105 102 106  CO2 19* 17* 18* 16* 18*  GLUCOSE 276* 106* 98 84 83  BUN 75* 81* 83* 84* 88*  CREATININE 4.98* 5.44* 5.61* 5.97* 5.89*  CALCIUM 7.6* 7.6* 7.8* 7.6* 7.7*  PHOS  --   --   --   --  7.2*   Liver Function Tests: Recent Labs  Lab 06/15/17 1207 06/20/17 0509  AST 18  --   ALT 10*  --   ALKPHOS 93  --   BILITOT 0.6  --   PROT 6.2*  --   ALBUMIN 3.2* 2.7*   No results for input(s): LIPASE, AMYLASE in the last 168 hours. No results for input(s): AMMONIA in the last 168 hours. CBC: Recent Labs  Lab 06/15/17 1207 06/16/17 0440 06/17/17 1408 06/18/17 0559 06/19/17 0737 06/20/17 0509  WBC 2.6* 2.1* 3.9* 3.6* 2.7* 2.5*  NEUTROABS 2.0  --   --   --   --   --   HGB 9.7* 9.7* 9.6* 9.4* 9.1* 9.4*  HCT 31.7* 32.7* 32.1* 31.4* 30.2* 30.8*  MCV 91.6 93.2 95.0 94.9 94.1 93.3  PLT 55* 58* 47* 50* 40* 40*   Cardiac Enzymes: No results for  input(s): CKTOTAL, CKMB, CKMBINDEX, TROPONINI in the last 168 hours. BNP: Invalid input(s): POCBNP CBG: No results for input(s): GLUCAP in the last 168 hours.  Time coordinating discharge:  Greater than 30 minutes  Signed:  Orson Eva, DO [facility] Pager: [phone] 06/20/2017, 10:51 AM

## 2017-07-04 DEATH — deceased

## 2017-08-08 ENCOUNTER — Other Ambulatory Visit: Payer: Medicare Other

## 2017-08-11 ENCOUNTER — Ambulatory Visit: Payer: Medicare Other | Admitting: Internal Medicine

## 2017-08-14 ENCOUNTER — Other Ambulatory Visit: Payer: Self-pay | Admitting: Nurse Practitioner
